# Patient Record
Sex: Male | Born: 1950
Health system: Southern US, Community
[De-identification: ages and names within clinical notes are randomized; demographics above are authoritative.]

## PROBLEM LIST (undated history)

## (undated) DIAGNOSIS — F32A Depression, unspecified: Secondary | ICD-10-CM

## (undated) DIAGNOSIS — E039 Hypothyroidism, unspecified: Secondary | ICD-10-CM

## (undated) DIAGNOSIS — T8789 Other complications of amputation stump: Secondary | ICD-10-CM

## (undated) DIAGNOSIS — D649 Anemia, unspecified: Secondary | ICD-10-CM

## (undated) DIAGNOSIS — Z992 Dependence on renal dialysis: Secondary | ICD-10-CM

## (undated) DIAGNOSIS — IMO0001 Reserved for inherently not codable concepts without codable children: Secondary | ICD-10-CM

## (undated) DIAGNOSIS — Z87442 Personal history of urinary calculi: Secondary | ICD-10-CM

## (undated) DIAGNOSIS — F329 Major depressive disorder, single episode, unspecified: Secondary | ICD-10-CM

## (undated) DIAGNOSIS — Z8701 Personal history of pneumonia (recurrent): Secondary | ICD-10-CM

## (undated) DIAGNOSIS — I2510 Atherosclerotic heart disease of native coronary artery without angina pectoris: Secondary | ICD-10-CM

## (undated) DIAGNOSIS — K219 Gastro-esophageal reflux disease without esophagitis: Secondary | ICD-10-CM

## (undated) DIAGNOSIS — E119 Type 2 diabetes mellitus without complications: Secondary | ICD-10-CM

## (undated) DIAGNOSIS — N289 Disorder of kidney and ureter, unspecified: Secondary | ICD-10-CM

## (undated) DIAGNOSIS — M1990 Unspecified osteoarthritis, unspecified site: Secondary | ICD-10-CM

## (undated) DIAGNOSIS — F419 Anxiety disorder, unspecified: Secondary | ICD-10-CM

## (undated) DIAGNOSIS — Z8719 Personal history of other diseases of the digestive system: Secondary | ICD-10-CM

## (undated) DIAGNOSIS — J189 Pneumonia, unspecified organism: Secondary | ICD-10-CM

## (undated) DIAGNOSIS — R51 Headache: Secondary | ICD-10-CM

## (undated) DIAGNOSIS — N186 End stage renal disease: Secondary | ICD-10-CM

## (undated) DIAGNOSIS — N189 Chronic kidney disease, unspecified: Secondary | ICD-10-CM

## (undated) DIAGNOSIS — I10 Essential (primary) hypertension: Secondary | ICD-10-CM

## (undated) DIAGNOSIS — R200 Anesthesia of skin: Secondary | ICD-10-CM

## (undated) DIAGNOSIS — G629 Polyneuropathy, unspecified: Secondary | ICD-10-CM

## (undated) DIAGNOSIS — I739 Peripheral vascular disease, unspecified: Secondary | ICD-10-CM

## (undated) DIAGNOSIS — J449 Chronic obstructive pulmonary disease, unspecified: Secondary | ICD-10-CM

## (undated) DIAGNOSIS — F4310 Post-traumatic stress disorder, unspecified: Secondary | ICD-10-CM

## (undated) HISTORY — DX: Chronic kidney disease, unspecified: N18.9

## (undated) HISTORY — DX: Type 2 diabetes mellitus without complications: E11.9

## (undated) HISTORY — DX: Dependence on renal dialysis: N18.6

## (undated) HISTORY — PX: INGUINAL HERNIA REPAIR: SUR1180

## (undated) HISTORY — DX: Chronic obstructive pulmonary disease, unspecified: J44.9

## (undated) HISTORY — PX: TONSILLECTOMY: SUR1361

## (undated) HISTORY — DX: End stage renal disease: Z99.2

## (undated) HISTORY — DX: Personal history of pneumonia (recurrent): Z87.01

## (undated) HISTORY — DX: Anemia, unspecified: D64.9

## (undated) HISTORY — DX: Essential (primary) hypertension: I10

---

## 1998-01-24 ENCOUNTER — Encounter: Admission: RE | Admit: 1998-01-24 | Discharge: 1998-04-24 | Payer: Self-pay | Admitting: Anesthesiology

## 1998-05-16 ENCOUNTER — Encounter: Admission: RE | Admit: 1998-05-16 | Discharge: 1998-08-11 | Payer: Self-pay | Admitting: Anesthesiology

## 1998-10-26 ENCOUNTER — Encounter: Admission: RE | Admit: 1998-10-26 | Discharge: 1999-01-24 | Payer: Self-pay | Admitting: Anesthesiology

## 1999-01-26 ENCOUNTER — Encounter: Admission: RE | Admit: 1999-01-26 | Discharge: 1999-04-26 | Payer: Self-pay | Admitting: Anesthesiology

## 1999-05-11 ENCOUNTER — Encounter: Admission: RE | Admit: 1999-05-11 | Discharge: 1999-08-09 | Payer: Self-pay | Admitting: Anesthesiology

## 1999-08-09 ENCOUNTER — Encounter: Admission: RE | Admit: 1999-08-09 | Discharge: 1999-11-06 | Payer: Self-pay | Admitting: Anesthesiology

## 1999-11-28 ENCOUNTER — Encounter: Payer: Self-pay | Admitting: Anesthesiology

## 1999-11-28 ENCOUNTER — Encounter: Admission: RE | Admit: 1999-11-28 | Discharge: 2000-01-22 | Payer: Self-pay | Admitting: Anesthesiology

## 2000-01-22 ENCOUNTER — Encounter: Admission: RE | Admit: 2000-01-22 | Discharge: 2000-04-21 | Payer: Self-pay | Admitting: Anesthesiology

## 2000-09-03 ENCOUNTER — Encounter: Admission: RE | Admit: 2000-09-03 | Discharge: 2000-12-02 | Payer: Self-pay | Admitting: Anesthesiology

## 2000-12-31 ENCOUNTER — Encounter: Admission: RE | Admit: 2000-12-31 | Discharge: 2001-03-31 | Payer: Self-pay | Admitting: Anesthesiology

## 2001-04-02 ENCOUNTER — Encounter: Admission: RE | Admit: 2001-04-02 | Discharge: 2001-04-12 | Payer: Self-pay | Admitting: Anesthesiology

## 2009-05-03 ENCOUNTER — Ambulatory Visit: Payer: Self-pay | Admitting: Cardiology

## 2009-08-13 HISTORY — PX: KNEE ARTHROSCOPY: SUR90

## 2010-08-13 HISTORY — PX: AV FISTULA PLACEMENT: SHX1204

## 2010-08-24 ENCOUNTER — Ambulatory Visit (HOSPITAL_COMMUNITY)
Admission: RE | Admit: 2010-08-24 | Discharge: 2010-08-24 | Payer: Self-pay | Source: Home / Self Care | Attending: Nephrology | Admitting: Nephrology

## 2010-08-28 LAB — CBC
HCT: 36.4 % — ABNORMAL LOW (ref 39.0–52.0)
Hemoglobin: 12.7 g/dL — ABNORMAL LOW (ref 13.0–17.0)
MCH: 33.4 pg (ref 26.0–34.0)
MCHC: 34.9 g/dL (ref 30.0–36.0)
MCV: 95.8 fL (ref 78.0–100.0)
Platelets: 191 10*3/uL (ref 150–400)
RBC: 3.8 MIL/uL — ABNORMAL LOW (ref 4.22–5.81)
RDW: 13.2 % (ref 11.5–15.5)
WBC: 8.1 10*3/uL (ref 4.0–10.5)

## 2010-08-28 LAB — APTT: aPTT: 29 seconds (ref 24–37)

## 2010-08-28 LAB — PROTIME-INR
INR: 0.97 (ref 0.00–1.49)
Prothrombin Time: 13.1 seconds (ref 11.6–15.2)

## 2010-11-27 ENCOUNTER — Emergency Department (HOSPITAL_COMMUNITY): Payer: 59

## 2010-11-27 ENCOUNTER — Inpatient Hospital Stay (HOSPITAL_COMMUNITY)
Admission: EM | Admit: 2010-11-27 | Discharge: 2010-11-30 | DRG: 638 | Disposition: A | Discharge status: Home / Self Care | Payer: 59 | Attending: Internal Medicine | Admitting: Internal Medicine

## 2010-11-27 DIAGNOSIS — E785 Hyperlipidemia, unspecified: Secondary | ICD-10-CM | POA: Diagnosis present

## 2010-11-27 DIAGNOSIS — B3781 Candidal esophagitis: Secondary | ICD-10-CM | POA: Diagnosis present

## 2010-11-27 DIAGNOSIS — N032 Chronic nephritic syndrome with diffuse membranous glomerulonephritis: Secondary | ICD-10-CM | POA: Diagnosis present

## 2010-11-27 DIAGNOSIS — K2960 Other gastritis without bleeding: Secondary | ICD-10-CM | POA: Diagnosis present

## 2010-11-27 DIAGNOSIS — K2100 Gastro-esophageal reflux disease with esophagitis, without bleeding: Secondary | ICD-10-CM | POA: Diagnosis present

## 2010-11-27 DIAGNOSIS — E1300 Other specified diabetes mellitus with hyperosmolarity without nonketotic hyperglycemic-hyperosmolar coma (NKHHC): Principal | ICD-10-CM | POA: Diagnosis present

## 2010-11-27 DIAGNOSIS — K319 Disease of stomach and duodenum, unspecified: Secondary | ICD-10-CM | POA: Diagnosis present

## 2010-11-27 DIAGNOSIS — B370 Candidal stomatitis: Secondary | ICD-10-CM | POA: Diagnosis present

## 2010-11-27 DIAGNOSIS — N189 Chronic kidney disease, unspecified: Secondary | ICD-10-CM | POA: Diagnosis present

## 2010-11-27 DIAGNOSIS — R1310 Dysphagia, unspecified: Secondary | ICD-10-CM | POA: Diagnosis present

## 2010-11-27 DIAGNOSIS — T380X5A Adverse effect of glucocorticoids and synthetic analogues, initial encounter: Secondary | ICD-10-CM | POA: Diagnosis present

## 2010-11-27 DIAGNOSIS — I129 Hypertensive chronic kidney disease with stage 1 through stage 4 chronic kidney disease, or unspecified chronic kidney disease: Secondary | ICD-10-CM | POA: Diagnosis present

## 2010-11-27 LAB — URINE MICROSCOPIC-ADD ON

## 2010-11-27 LAB — GLUCOSE, CAPILLARY
Glucose-Capillary: >600 mg/dL (ref 70–99)
Glucose-Capillary: >600 mg/dL (ref 70–99)

## 2010-11-27 LAB — CBC
HCT: 34.2 % — ABNORMAL LOW (ref 39.0–52.0)
Hemoglobin: 12.2 g/dL — ABNORMAL LOW (ref 13.0–17.0)
MCH: 33.1 pg (ref 26.0–34.0)
MCHC: 35.7 g/dL (ref 30.0–36.0)
MCV: 92.7 fL (ref 78.0–100.0)
Platelets: 110 10*3/uL — ABNORMAL LOW (ref 150–400)
RBC: 3.69 MIL/uL — ABNORMAL LOW (ref 4.22–5.81)
RDW: 13.9 % (ref 11.5–15.5)
WBC: 6 10*3/uL (ref 4.0–10.5)

## 2010-11-27 LAB — COMPREHENSIVE METABOLIC PANEL
ALT: 26 U/L (ref 0–53)
AST: 15 U/L (ref 0–37)
Albumin: 2.8 g/dL — ABNORMAL LOW (ref 3.5–5.2)
Alkaline Phosphatase: 70 U/L (ref 39–117)
BUN: 80 mg/dL — ABNORMAL HIGH (ref 6–23)
CO2: 24 mEq/L (ref 19–32)
Calcium: 9 mg/dL (ref 8.4–10.5)
Chloride: 95 mEq/L — ABNORMAL LOW (ref 96–112)
Creatinine, Ser: 4.02 mg/dL — ABNORMAL HIGH (ref 0.4–1.5)
GFR calc Af Amer: 19 mL/min — ABNORMAL LOW (ref >60)
GFR calc non Af Amer: 15 mL/min — ABNORMAL LOW (ref >60)
Glucose, Bld: 868 mg/dL (ref 70–99)
Potassium: 4.2 mEq/L (ref 3.5–5.1)
Sodium: 130 mEq/L — ABNORMAL LOW (ref 135–145)
Total Bilirubin: 0.6 mg/dL (ref 0.3–1.2)
Total Protein: 5.4 g/dL — ABNORMAL LOW (ref 6.0–8.3)

## 2010-11-27 LAB — DIFFERENTIAL
Basophils Absolute: 0 10*3/uL (ref 0.0–0.1)
Basophils Relative: 0 % (ref 0–1)
Eosinophils Absolute: 0 10*3/uL (ref 0.0–0.7)
Eosinophils Relative: 0 % (ref 0–5)
Lymphocytes Relative: 23 % (ref 12–46)
Lymphs Abs: 1.4 10*3/uL (ref 0.7–4.0)
Monocytes Absolute: 0.3 10*3/uL (ref 0.1–1.0)
Monocytes Relative: 6 % (ref 3–12)
Neutro Abs: 4.3 10*3/uL (ref 1.7–7.7)
Neutrophils Relative %: 72 % (ref 43–77)

## 2010-11-27 LAB — URINALYSIS, ROUTINE W REFLEX MICROSCOPIC
Bilirubin Urine: NEGATIVE
Glucose, UA: >1000 mg/dL — AB
Ketones, ur: NEGATIVE mg/dL
Leukocytes, UA: NEGATIVE
Nitrite: NEGATIVE
Protein, ur: 100 mg/dL — AB
Specific Gravity, Urine: 1.02 (ref 1.005–1.030)
Urobilinogen, UA: 0.2 mg/dL (ref 0.0–1.0)
pH: 6 (ref 5.0–8.0)

## 2010-11-27 LAB — POCT CARDIAC MARKERS
CKMB, poc: 2.4 ng/mL (ref 1.0–8.0)
CKMB, poc: 1.9 ng/mL (ref 1.0–8.0)
Myoglobin, poc: 330 ng/mL (ref 12–200)
Myoglobin, poc: 367 ng/mL (ref 12–200)
Troponin i, poc: <0.05 ng/mL (ref 0.00–0.09)
Troponin i, poc: <0.05 ng/mL (ref 0.00–0.09)

## 2010-11-27 LAB — BRAIN NATRIURETIC PEPTIDE: Pro B Natriuretic peptide (BNP): 47.8 pg/mL (ref 0.0–100.0)

## 2010-11-27 LAB — MRSA PCR SCREENING: MRSA by PCR: NEGATIVE

## 2010-11-27 LAB — LIPASE, BLOOD: Lipase: 37 U/L (ref 11–59)

## 2010-11-28 ENCOUNTER — Inpatient Hospital Stay (HOSPITAL_COMMUNITY): Payer: 59

## 2010-11-28 DIAGNOSIS — R1310 Dysphagia, unspecified: Secondary | ICD-10-CM

## 2010-11-28 DIAGNOSIS — R9431 Abnormal electrocardiogram [ECG] [EKG]: Secondary | ICD-10-CM

## 2010-11-28 DIAGNOSIS — B3781 Candidal esophagitis: Secondary | ICD-10-CM

## 2010-11-28 LAB — GLUCOSE, CAPILLARY
Glucose-Capillary: 108 mg/dL — ABNORMAL HIGH (ref 70–99)
Glucose-Capillary: 126 mg/dL — ABNORMAL HIGH (ref 70–99)
Glucose-Capillary: 176 mg/dL — ABNORMAL HIGH (ref 70–99)
Glucose-Capillary: 177 mg/dL — ABNORMAL HIGH (ref 70–99)
Glucose-Capillary: 179 mg/dL — ABNORMAL HIGH (ref 70–99)
Glucose-Capillary: 214 mg/dL — ABNORMAL HIGH (ref 70–99)
Glucose-Capillary: 244 mg/dL — ABNORMAL HIGH (ref 70–99)
Glucose-Capillary: 261 mg/dL — ABNORMAL HIGH (ref 70–99)
Glucose-Capillary: 299 mg/dL — ABNORMAL HIGH (ref 70–99)
Glucose-Capillary: 303 mg/dL — ABNORMAL HIGH (ref 70–99)
Glucose-Capillary: 359 mg/dL — ABNORMAL HIGH (ref 70–99)
Glucose-Capillary: 510 mg/dL — ABNORMAL HIGH (ref 70–99)
Glucose-Capillary: 587 mg/dL (ref 70–99)
Glucose-Capillary: 93 mg/dL (ref 70–99)

## 2010-11-28 LAB — DIFFERENTIAL
Basophils Absolute: 0 10*3/uL (ref 0.0–0.1)
Basophils Relative: 0 % (ref 0–1)
Eosinophils Relative: 0 % (ref 0–5)
Monocytes Absolute: 0.4 10*3/uL (ref 0.1–1.0)
Neutro Abs: 5.2 10*3/uL (ref 1.7–7.7)

## 2010-11-28 LAB — CARDIAC PANEL(CRET KIN+CKTOT+MB+TROPI)
CK, MB: 2.9 ng/mL (ref 0.3–4.0)
CK, MB: 4.3 ng/mL — ABNORMAL HIGH (ref 0.3–4.0)
Relative Index: INVALID (ref 0.0–2.5)
Total CK: 25 U/L (ref 7–232)
Troponin I: 0.05 ng/mL (ref 0.00–0.06)

## 2010-11-28 LAB — COMPREHENSIVE METABOLIC PANEL
ALT: 24 U/L (ref 0–53)
AST: 16 U/L (ref 0–37)
Calcium: 8.6 mg/dL (ref 8.4–10.5)
GFR calc Af Amer: 20 mL/min — ABNORMAL LOW (ref >60)
Sodium: 140 mEq/L (ref 135–145)
Total Protein: 5 g/dL — ABNORMAL LOW (ref 6.0–8.3)

## 2010-11-28 LAB — APTT: aPTT: 22 seconds — ABNORMAL LOW (ref 24–37)

## 2010-11-28 LAB — CBC
MCHC: 36 g/dL (ref 30.0–36.0)
RDW: 14.1 % (ref 11.5–15.5)

## 2010-11-28 LAB — IRON AND TIBC
Iron: 33 ug/dL — ABNORMAL LOW (ref 42–135)
TIBC: 200 ug/dL — ABNORMAL LOW (ref 215–435)

## 2010-11-28 LAB — VITAMIN B12: Vitamin B-12: 475 pg/mL (ref 211–911)

## 2010-11-28 LAB — PROTIME-INR
INR: 0.94 (ref 0.00–1.49)
Prothrombin Time: 12.8 seconds (ref 11.6–15.2)

## 2010-11-28 LAB — HEMOGLOBIN A1C: Mean Plasma Glucose: 275 mg/dL — ABNORMAL HIGH (ref <117)

## 2010-11-28 LAB — FOLATE: Folate: >20 ng/mL

## 2010-11-29 ENCOUNTER — Other Ambulatory Visit (INDEPENDENT_AMBULATORY_CARE_PROVIDER_SITE_OTHER): Payer: Self-pay | Admitting: Internal Medicine

## 2010-11-29 DIAGNOSIS — K208 Other esophagitis without bleeding: Secondary | ICD-10-CM

## 2010-11-29 DIAGNOSIS — K219 Gastro-esophageal reflux disease without esophagitis: Secondary | ICD-10-CM

## 2010-11-29 DIAGNOSIS — K2970 Gastritis, unspecified, without bleeding: Secondary | ICD-10-CM

## 2010-11-29 DIAGNOSIS — K2990 Gastroduodenitis, unspecified, without bleeding: Secondary | ICD-10-CM

## 2010-11-29 LAB — DIFFERENTIAL
Basophils Absolute: 0 10*3/uL (ref 0.0–0.1)
Eosinophils Absolute: 0 10*3/uL (ref 0.0–0.7)
Eosinophils Relative: 0 % (ref 0–5)
Lymphocytes Relative: 26 % (ref 12–46)

## 2010-11-29 LAB — GLUCOSE, CAPILLARY
Glucose-Capillary: 184 mg/dL — ABNORMAL HIGH (ref 70–99)
Glucose-Capillary: 334 mg/dL — ABNORMAL HIGH (ref 70–99)
Glucose-Capillary: 304 mg/dL — ABNORMAL HIGH (ref 70–99)
Glucose-Capillary: 309 mg/dL — ABNORMAL HIGH (ref 70–99)

## 2010-11-29 LAB — BASIC METABOLIC PANEL
GFR calc non Af Amer: 15 mL/min — ABNORMAL LOW (ref >60)
Potassium: 4 mEq/L (ref 3.5–5.1)
Sodium: 131 mEq/L — ABNORMAL LOW (ref 135–145)

## 2010-11-29 LAB — CBC
HCT: 29.2 % — ABNORMAL LOW (ref 39.0–52.0)
Platelets: 86 10*3/uL — ABNORMAL LOW (ref 150–400)
RDW: 14.4 % (ref 11.5–15.5)
WBC: 6.4 10*3/uL (ref 4.0–10.5)

## 2010-11-29 LAB — PHOSPHORUS: Phosphorus: 3.4 mg/dL (ref 2.3–4.6)

## 2010-11-29 LAB — LIPASE, BLOOD: Lipase: 22 U/L (ref 11–59)

## 2010-11-29 LAB — MAGNESIUM: Magnesium: 2 mg/dL (ref 1.5–2.5)

## 2010-11-30 LAB — GLUCOSE, CAPILLARY
Glucose-Capillary: 168 mg/dL — ABNORMAL HIGH (ref 70–99)
Glucose-Capillary: 356 mg/dL — ABNORMAL HIGH (ref 70–99)

## 2010-12-01 LAB — H. PYLORI ANTIBODY, IGG: H Pylori IgG: <0.4 {ISR}

## 2010-12-17 NOTE — Op Note (Signed)
[name], [name]               ACCOUNT NO.:  [id number]  MEDICAL RECORD NO.:  [id number]           PATIENT TYPE:  I  LOCATION:  A313                          FACILITY:  [facility]  PHYSICIAN:  Hildred Laser, M.D.    DATE OF BIRTH:  [birth year]  DATE OF PROCEDURE:  11/29/2010 DATE OF DISCHARGE:                               PROCEDURE NOTE   PROCEDURE:  Esophagogastroduodenoscopy with esophageal dilation.  INDICATIONS:  [name] is 60 year old Caucasian male who was admitted 2 days ago with blood glucose level of 800 and nonketotic hyperosmolar state.  He has recently been on high-dose prednisone for glomerulosclerosis.  He has been having intermittent dysphagia for several months.  He is therefore undergoing this evaluation.  He is also being treated for oral candidiasis.  Procedures were reviewed with the patient.  Informed consent was obtained.  MEDS FOR CONSCIOUS SEDATION:  Cetacaine spray for oropharyngeal topical anesthesia, Demerol 50 mg IV, Versed 12 mg IV in divided dose.  FINDINGS:  Procedure performed in endoscopy suite.  The patient's vital signs and O2 sat were monitored during the procedure and remained stable.  The patient was placed in left lateral recumbent position and Pentax videoscope was passed through oropharynx without any difficulty into the esophagus.  Esophagus.  In the proximal segment, there were few small patches of whitish material or exudate consistent with candida.  Mucosa in the distal segment was normal.  There was single erosion just proximal to GE junction and a small ulcer at GE junction along with a ring which is not critical.  A GE junction was located at 40 cm and hiatus at 42.  Stomach.  It was empty and distended very well by insufflation.  Folds of proximal stomach are normal.  Examination of mucosa revealed few antral/prepyloric erosions, but no ulcer crater was noted.  Pyloric channel was patent.  Angularis, fundus, and cardia were  examined. Angularis, fundus, and cardia, these areas were normal.  Duodenum.  Bulbar mucosa was normal.  Scope was passed to second part of duodenum where there were multiple small areas with hyperemia and a flat surface compared to the surrounding mucosa.  None of these appeared to be ulcerated or eroded.  Biopsy was taken from three of these lesions and the scope was withdrawn.  Esophagus was dilated by passing 54-French Maloney dilator to full insertion.  The post dilation endoscope was passed again and no mucosal disruption noted to the esophagus.  The patient tolerated the procedure well.  FINAL DIAGNOSES: 1. Mild changes of Candida esophagitis limited to proximal esophagus.     I suspect this is resolving or treated Candida. 2. Erosive/ulcerative reflux esophagitis with noncritical ring and a     small sliding hiatal hernia.  Esophagus was dilated by passing 54-     [place] Maloney dilator given history of dysphagia, but no mucosal     disruption induced. 3. Erosive antral gastritis. 4. Multiple small patches of flat, slightly erythematous mucosa     involving the second part of the duodenum.  This area was biopsied     for histology.  This could be partially  healed ulcers.  Endoscopic     appearance not typical of celiac disease.  RECOMMENDATIONS: 1. He is to be on a chronic PPI therapy.  Should continue with     Mycostatin for another 10 days or so.  If this infection recurs, he     will need to be treated with fluconazole.  Please note that with     diabetes and steroid use is at high risk for Candida esophagitis. 2. We will check his H. pylori serology. 3. I will be contacting patient with results of biopsy when available.     Hildred Laser, M.D.     NR/MEDQ  D:  11/29/2010  T:  11/30/2010  Job:  [id number]  cc:   Dr. Woody Seller  Electronically Signed by Hildred Laser M.D. on 12/17/2010 09:39:16 PM

## 2010-12-17 NOTE — Consult Note (Signed)
NAMECORBAN, YOTHER               ACCOUNT NO.:  [id number]  MEDICAL RECORD NO.:  [id number]           PATIENT TYPE:  LOCATION:                                 FACILITY:  PHYSICIAN:  Hildred Laser, M.D.    DATE OF BIRTH:  1950-11-19  DATE OF CONSULTATION:  11/28/2010 DATE OF DISCHARGE:                                CONSULTATION   He is in ICU bed 12.  REASON FOR CONSULTATION:  Dysphagia.  HISTORY OF PRESENT ILLNESS:  Mr. Zeller is a 60 year old white male admitted through the emergency department with a new onset of diabetes. He also complained of acid reflux and dysphagia.  He also states that he had thrush which started over a week ago.  He had a whitish coating to his gums.  He has actually tried treating the candidiasis himself at home with gentian violet which he says has helped some, but has not completely resolved.  He does have a history of candidiasis of his oropharynx in the past.  He is presently using nystatin which is helping.  He gives a recent history of water burning and bubbles up into his esophagus.  He does not have problems with tea.  He has had solid food dysphagia for about a year.  Foods are lodging in his mid esophagus.  He chews his food well and takes sips of water for the bolus to go down.  He denies a prior history of acid reflux.  He says dysphagia occurs once a week or every other week.  PAST MEDICAL HISTORY: 1. 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 2. New-onset diabetes, possibly related to his steroid use. 3. Hypertension. 4. High cholesterol. 5. Migraines. 6. Oral candidiasis.  ALLERGIES:  No known allergies but he says the morphine does not help relieve his pain.  PAST SURGICAL HISTORY:  He has had a right knee arthroscopy, tonsillectomy, and a right hernia repair x2.  SOCIAL HISTORY:  He smokes 5 cigars a day.  He smokes two marijuana joints a  day.  He is disabled.  He has two children in good health.  FAMILY HISTORY:  His father is deceased from a brain aneurysm.  His mother is deceased from complications of lung surgery for a carcinoma. Two brothers in good health.  One sister alive, in good health that is being evaluated for Pott's syndrome.  HOME MEDICATIONS: 1. Simvastatin 20 mg a day. 2. Norvasc 10 mg a day. 3. Diazepam 10 mg three times a day. 4. Carvedilol 25 mg twice a day. 5. Lasix 40 mg a day. 6. Prednisone taper presently at 55 mg a day. 7. Hydralazine one a day. 8. Oxycodone 10 mg four times a day as needed. 9. MiraLax once a day.  OBJECTIVE:  VITAL SIGNS:  His blood pressure is 141/82, pulse 74, respirations 18.  His O2 sat is 96.  His weight is 96.7 kg.  His height is 76 inches. HEENT:  His sclerae are anicteric.  His conjunctivae are pink.  He is edentulous.  His oral  mucosa is dry.  He has a brownish discoloration to his tongue.  I do not see any white patches to his gums. NECK:  His thyroid is normal.  There is no cervical lymphadenopathy. HEART:  Regular rate and rhythm. ABDOMEN:  Soft.  Bowel sounds are positive.  No masses. EXTREMITIES:  He has 2+ edema to his lower extremities.  He is on a clear liquid diet.  LABORATORY DATA:  On November 18, 2010, his creatine kinase is 36; CK-MB 4.3, elevated slightly; troponin is 0.04.  Sodium 140, potassium 3.6, chloride 106, CO2 is 22, glucose is down to 160, BUN 69, creatinine is 3.74 and elevated, total bilirubin is 0.6, ALP 65, AST 16, ALT 24, total protein 5.0, albumin 2.6, calcium 8.6.  CBC:  His white count today is 7.6, hemoglobin 11.7, hematocrit is 32.5, MCV is 92.3, platelets are 100 and low.  His PTT is 22, PT is 12.8, INR is 0.94.  Urine:  0-2 wbc's, 21- 50 rbc's, rare bacteria, rare yeast.  He did undergo ultrasound of the abdomen this afternoon which revealed an echogenic kidneys bilaterally compatible with medical renal disease, incomplete  visualization of the pancreatic tail, and mildly dilated common bile, common bile duct was 9 mm in diameter.  ASSESSMENT:  Mr. Lahner is a 60 year old male who is undergoing therapy nonketotic hyperosmolar state. He is also being treated for oral candidiasis. He has been experiencing  dysphagia  for over a year. Therefore doubt that it is secondary to Candida. He possibly has esophageal ring but also could have  an esophageal stricture.  RECOMMENDATIONS:  Agree with Protonix and  oral nystatin. We will schedule an EGD/ED in am. Thank you for allowing [place] to participate in his care.    ______________________________ Deberah Castle, NP   ______________________________ Hildred Laser, M.D.   TS/MEDQ  D:  11/28/2010  T:  11/29/2010  Job:  [id number]  Electronically Signed by Deberah Castle PA on 11/29/2010 11:42:14 AM Electronically Signed by Hildred Laser M.D. on 12/17/2010 09:37:37 PM

## 2010-12-18 NOTE — Discharge Summary (Signed)
[name], [name]               ACCOUNT NO.:  [id number]  MEDICAL RECORD NO.:  [id number]           PATIENT TYPE:  I  LOCATION:  A313                          FACILITY:  [facility]  PHYSICIAN:  [name], MDDATE OF BIRTH:  1951-01-28  DATE OF ADMISSION:  11/27/2010 DATE OF DISCHARGE:  04/19/2012LH                              DISCHARGE SUMMARY   DISCHARGE DIAGNOSES: 1. Hyperosmolar nonketotic state. 2. New-onset diabetes, steroid induced. 3. Thrush with probable Candida esophagitis. 4. 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 5. Hypertension. 6. Hyperlipidemia. 7. Chronic edema. 8. Erosive reflux esophagitis with noncritical ring and small sliding     hiatal hernia status post dilatation. 9. Erosive antral gastritis. 10.Multiple lesions of the duodenum, possibly healed ulcers.  DISCHARGE MEDICATIONS: 1. Acetaminophen 650 mg p.o. q.4 h p.r.n. pain. 2. Lantus insulin 16 units subcutaneously nightly. 3. NovoLog sliding scale as directed. 4. Nystatin suspension 30 mL p.o. q.i.d. for another week. 5. Omeprazole 40 mg a day. 6. Prednisone has been decreased to 40 mg a day per Dr. Serita [name]     recommendations. 7. Continue amlodipine 10 mg a day. 8. Carvedilol 25 mg twice a day. 9. Cyclobenzaprine 10 mg p.o. t.i.d. as needed for muscle spasm. 10.Diazepam 10 mg daily as needed for anxiety. 11.Furosemide 40 mg a day. 12.Hydralazine 50 mg t.i.d. 13.Oxycodone 5 mg 2 tablets 4 times a day as needed for pain. 14.Simvastatin 20 mg a day. 15.Vitamin D2 of 50,000 units weekly.  CONDITION:  Stable.  ACTIVITY:  Ad lib.  DIET:  Should be diabetic heart-healthy.  FOLLOWUP:  With Dr. Elmarie [name].  He is to call for an appointment. Follow up with Dr. Quillian [name] to assist with diabetes management.  CONSULTATIONS:  Dr.  Hildred [name].  PROCEDURES:  EGD showing mild changes of Candida esophagitis limited to proximal esophagus, erosive ulcerative reflux esophagitis  with noncritical ring, and small sliding hiatal hernia.  Erosive antral gastritis, multiple small patches in the duodenum, possibly healed ulcers status post biopsy, pending.  LABORATORY DATA ON ADMISSION:  CBC significant for a platelet count of 110, normal white count, hemoglobin was 12.2, and hematocrit 34.2. Sodium on admission 130, potassium 4.2, chloride 95, bicarbonate 24, glucose 868, BUN 80, and creatinine 4.02.  Liver function tests significant for an albumin of 2.8.  On April 18, his glucose was 212, BUN 63, and creatinine 4.  Hemoglobin A1c was 11.2.  BNP, cardiac enzymes negative.  TSH 4.48, iron 33, TIBC 200, ferritin 212, folate and B12unremarkable.  Urinalysis on admission showed greater than 1000 glucose, large blood, 100 protein, 21-50 red cells, rare yeast, rare bacteria.  Urine cultures negative.  DIAGNOSTICS:  Chest x-ray showed no infiltrate, question nipple shadow, calcified mildly tortuous aorta.  Repeat chest x-ray showed no pulmonary nodules.  Ultrasound of the abdomen showed echogenic kidneys bilateral compatible with medical renal disease.  Mildly dilated common bile duct. EKG showed normal sinus rhythm, right bundle-branch block.  HISTORY AND HOSPITAL COURSE:  Please see H and P for details.  [name] is a pleasant 60 year old white male on high-dose steroids for recently diagnosed focal segmental glomerulosclerosis.  He presented with severe pain with swallowing, polyuria, polydipsia, and acid reflux symptoms.  He had a blood pressure of 143/91 in the ER, otherwise unremarkable vital signs.  He had dry mucous membranes and evidence of thrush.  He was found to be severely hyperglycemic.  He previously had no diagnosis of diabetes.  His anion gap was normal.  He was started on IV fluids, IV insulin, and monitored in the Step-Down Unit.  He was given nystatin, proton pump inhibitors, and GI was consulted.  His odynophagia and thrush and dehydration  improved.  His sugars are ranging in the 100-300 range but generally below 200 over the past 24 hours.  He has been given diabetic teaching.  I discussed the case with [name] and he recommends decreasing the prednisone to 40 mg and he will taper it off as an outpatient.  He will so try an alternate treatment for the kidney problem as he apparently has not responded to the steroids. Because of his prednisone being tapered, I have placed the patient on insulin with frequent blood glucose checks so that his regimen can be adjusted as his prednisone dose is decreased.  [name] agrees with this plan.  [name] agreed with management and recommended continuing the nystatin and proton pump inhibitor.  He will call the patient with the results of the biopsy.  The patient's symptoms are much improved.  His vital signs stable and he is going to be discharged with outpatient diabetes education.  Total time on the day of discharge is greater than 30 minutes.     Clayton Massman L. Conley Canal, MD     CLS/MEDQ  D:  11/30/2010  T:  12/01/2010  Job:  [id number]  cc:   Clayton Shiley, MD  Dr. Virgel [name]  Dr. Delma [name]  Electronically Signed by Clayton Barthel MD on 12/18/2010 08:05:44 AM

## 2010-12-25 NOTE — H&P (Signed)
[name], [name]               ACCOUNT NO.:  [id number]  MEDICAL RECORD NO.:  [id number]           PATIENT TYPE:  E  LOCATION:  [facility]                          FACILITY:  [facility]  PHYSICIAN:  Bonnielee Haff, MD     DATE OF BIRTH:  Oct 18, 1950  DATE OF ADMISSION:  11/27/2010 DATE OF DISCHARGE:  LH                             HISTORY & PHYSICAL   PRIMARY CARE PHYSICIAN:  Dr. Quillian Quince in [place], [place].  The patient is followed by Dr. Elmarie Shiley with [facility] in [place].  ADMISSION DIAGNOSES: 1. Hyperosmolar nonketotic state with hyperglycemia. 2. New-onset diabetes. 3. Oral candidiasis. 4. Gastroesophageal reflux disease. 5. 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 6. History of hypertension. 7. History of hypercholesterolemia.  CHIEF COMPLAINT:  Indigestion, acid reflux.  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  He has been followed by Dr. Elmarie Shiley.  His creatinine fluctuates between 2.7 and 4.  He was put on steroids a couple of months ago for this condition.  He presents today to the hospital with complaints of worsening indigestion over the last 2 weeks.  He has been having a lot of acid reflux, lot of regurgitation.  He has never had similar symptoms in the past.  These symptoms have been getting worse. He has also had some nonspecific abdominal pain on and off, none currently.  He has had mild vomiting as a result of regurgitation as well.  He denies any chest pain or shortness of breath per se.  He is feeling a little bit depressed because his father passed away about 2 weeks ago.  He has been very stressed as well.  He has also had some constipation issues for awhile.  He has been feeling very thirsty, has had increased urination, has had some burning sensation with  urination as well in the last couple of weeks.  MEDICATIONS AT HOME: 1. Simvastatin 20 mg daily. 2. Norvasc 10 mg daily. 3. Diazepam 10 mg t.i.d. 4. Carvedilol 25 mg b.i.d. 5. Lasix 40 mg daily. 6. Prednisone currently on 55 mg daily.  He was on 60 mg for awhile. 7. Hydralazine 50 mg 4 times a day. 8. Oxycodone 10 mg 4 times a day as needed.  ALLERGIES:  Include MORPHINE, actually it is not a true allergic reaction.  He tells me that the morphine is ineffective.  PAST MEDICAL HISTORY:  Positive for hypertension, hypercholesterolemia, recently diagnosed focal segmental glomerulosclerosis, this was diagnosed based on a renal biopsy done in January 2012.  He is followed by Dr. Elmarie Shiley in [place].  SURGICAL HISTORY:  Includes hernia surgery, arthroscopic procedure to the right knee, tonsillectomy, and renal biopsy.  SOCIAL HISTORY:  Lives in [place] with his wife.  His wife works in the [employer] at [employer].  He smokes 1 pack of cigarettes including some cigars. No alcohol use.  He said he has had 1 beer the entire last year.  Drugs, he does admit to use smoking marijuana at least once a day to help him with pain and to help him go to sleep.  FAMILY HISTORY:  There is history of colon cancer and diabetes in the family.  REVIEW OF SYSTEMS:  GENERAL SYSTEM:  Positive for weakness, malaise. HEENT:  Unremarkable.  CARDIOVASCULAR:  As in HPI.  GI: As in HPI.  GU: Unremarkable.  NEUROLOGIC:  Unremarkable.  PSYCHIATRIC:  Unremarkable. RHEUMATOLOGIC:  Unremarkable.  Other systems were reviewed and were found to be negative.  PHYSICAL EXAMINATION:  VITAL SIGNS:  Temperature 98.2, blood pressure 143/91, heart rate 74, respiratory rate 20, saturation 94% on room air. GENERAL:  He is an overweight white male, in no distress. HEENT:  Head is normocephalic, atraumatic.  Pupils are equal reacting. No pallor, no icterus.  Oral mucous membranes is slightly dry.  He has got some white plaques in  the oral cavity, suggestive of Candida. NECK:  Soft and supple.  No thyromegaly is appreciated.  No cervical, supraclavicular, or inguinal lymphadenopathy is present. LUNGS:  Clear to auscultation bilaterally with no wheezing, rales, or rhonchi. CARDIOVASCULAR:  S1 and S2 is normal, regular.  No S3, S4, rubs, murmurs, or bruits. ABDOMEN:  Soft.  Minimal epigastric tenderness without any rebound, rigidity, or guarding.  No masses or organomegaly is appreciated. GU: Deferred. MUSCULOSKELETAL:  Normal muscle mass and tone. NEUROLOGIC:  He is alert and oriented x3.  No focal neurological deficits are present. SKIN:  Does not reveal any rashes.  LAB DATA:  His white cell count is 6.0, hemoglobin is 12.2, platelet count is 110.  Sodium is 130, potassium 4.2, chloride is 95, bicarb is 24, glucose is 868, anion gap is 11, BUN is 80, creatinine is 4.  LFTs are normal.  Albumin is 2.8.  No older creatinine available in the last 1 year.  His cardiac enzymes are negative.  Lipase is 37.  BNP is 47. His UA shows glucosuria, large blood, some protein, 21-50 rbc's are noted, 0-2 wbc's, rare bacteria, rare yeast.  EKG was done which shows sinus rhythm with normal axis.  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 No older EKG available for comparison at this time.  IMAGING STUDIES:  He had a chest x-ray which showed no infiltrate or CHF or pneumonia, nipple shadow on the right, calcified mildly tortuous aorta was noted.  ASSESSMENT:  This is a 60 year old Caucasian male with a history of hypertension, hypercholesterolemia, who was diagnosed with focal segmental glomerulosclerosis in January 2012, and was put on steroids by his nephrologist.  He comes in with fatigue, acid reflux, and he was found to have hyperglycemia.  This is most likely steroid-induced diabetes.  He has abnormal EKG as well.  He has oral candidiasis.  PLAN: 1. Hyperosmolar nonketotic  state with hyperglycemia and new-onset     diabetes secondary to steroids.  He will be treated with IV insulin     and will be transitioned to subcutaneous insulin when his blood     sugars come down. 2. He will be given IV fluids as well. 3. Acid reflux are probably from steroids and high blood sugar.  He     does have oral candidiasis, so he could have esophageal candidiasis     as well.  We will put him on PPI, give him oral nystatin for now,     and see how his symptoms evolve.  If his symptoms do not improve,  he may need a GI workup. 4. Abnormal EKG.  He said he has had an EKG done in his PMD's office     and has had a negative stress test within the last 2 years.  We     will try to obtain results of the EKG from his PMD's office.  We     will cycle cardiac enzymes, get an echocardiogram as well. 5. Oral candidiasis.  He will be treated with nystatin. 6. Anemia, probably from chronic acute kidney disease.  We will check     anemia panel. 7. Focal segmental glomerulosclerosis.  Continue with steroids for     now.  We will obtain records from Dr. Serita Grit office. 8. Hematuria.  We will get an ultrasound of his abdomen, check a total     CK level. 9. DVT prophylaxis will be initiated.  Further management decisions will depend on results of further testing and the patient's response to treatment.  Bonnielee Haff, MD     GK/MEDQ  D:  11/27/2010  T:  11/27/2010  Job:  [id number]  cc:   Elmarie Shiley, MD  Dr. Virgel Gess, [place]  Electronically Signed by Bonnielee Haff MD on 12/25/2010 10:19:29 PM

## 2010-12-29 NOTE — H&P (Signed)
[facility]  Patient:    [name], [name] Visit Number: [id number] MRN: [id number]          Service Type: PMG Location: [facility] Attending Physician:  Nicholaus Bloom Adm. Date:  12/31/2000 Disc. Date: 03/31/2001   CC:         Dr. Gar Ponto, Ledell Noss [place]   History and Physical  INTERVAL HISTORY:  Cortavious comes in for follow-up evaluation of his chronic neck pain on the basis of cervical spondylosis and myofascial pain.  Since his last evaluation, the patient has been laid off, been to three funerals, and is very stressed out with his family.  He continues to have neck discomfort, but it is much improved from previously.  He has developed some epigastric versus chest pain and has been consulted with Dr. Quillian Quince concerning this.  He has developed a left maxillary sinus pain with greenish discharge.  He is wheezing recently.  He has been to the beach and feels better after having gone there.  He continues to have midthoracic dorsal spine pain and some left hip discomfort to a mild extent.  MEDICATIONS: Toprol, Hyzaar, hydrocodone, and Xanax.  PHYSICAL EXAMINATION:  VITAL SIGNS:  Blood pressure 134/75, heart rate 59, respiratory rate 17, O2 saturation 98%, pain level is 6/10.  MUSCULOSKELETAL/NEUROLOGIC:  Range of motion of his neck was relatively intact.  Deep tendon reflexes were symmetric in the upper and lower extremities.  Motor is 5/5.  HEENT:  He demonstrates diffuse wheezes of his lungs.  He has tenderness over his left maxillary sinus.  IMPRESSION: 1. Cervical spondylosis with neck pain. 2. Upper respiratory tract infection. 3. Other medical problems per Dr. Gar Ponto.  DISPOSITION: 1. Continue hydrocodone 7.5/500 mg one p.o. q.8h. p.r.n., #50 with two    refills. 2. Xanax 2 mg one-half to one p.o. q.8h. p.r.n., #100, with no refill. 3. Bactrim DS one p.o. b.i.d., #20 with no refill.  He is not allergic to    sulfonamides per his  knowledge. 4. Follow up with me in three months. Attending Physician:  Nicholaus Bloom DD:  04/02/01 TD:  04/03/01 Job: [id number]

## 2010-12-29 NOTE — Procedures (Signed)
[place]  Patient:    [name], [name]                      MRN: [id number] Proc. Date: 12/11/99 Adm. Date:  [id number] Attending:  Nicholaus Bloom CC:         Dr. Gar Ponto                           Procedure Report  PROCEDURE PERFORMED:  Bilateral lesser occipital nerve blocks.  ANESTHESIOLOGIST:  Nicholaus Bloom, M.D.  DIAGNOSIS:  Cervicogenic headaches with underlying cervical spondylosis and mild lumbar degenerative disk disease.  Since his last evaluation, the patient has been on Zanaflex.  He has noted that he has a little bit more fatigue but he has been out fishing and he has done a little bit more work on his house.  He complains today mostly of neck discomfort.  PHYSICAL EXAMINATION:  Blood pressure 126/74, heart rate 72, respiratory rate 16, oxygen saturations 100%, pain level is 7/10.  Temperature is 97 degrees. He has had tenderness over the lesser occipital grooves bilaterally.  Deep tendon reflexes in the lower and upper extremities were symmetric.  Other medications include Gemfibrozil, Toprol, Lotensin.  His Zanaflex is up to 5 tablets per day as well as glucosamine and Xanax.  DESCRIPTION OF PROCEDURE:  After verbal informed consent was obtained, I went ahead and prepped out the lesser occipital grooves bilaterally and injected each site using a 25 gauge needle following Betadine prep x 3 with 2 cc of 1% lidocaine with 15 mg of Medrol.  The patient tolerated the procedure well and noted that his headache improved.  Postprocedure condition was stable.  DISCHARGE INSTRUCTIONS: 1. Resume previous diet. 2. Limitation of activies per instruction sheet. 3. Continue to increase the dose of Zanaflex as per protocol. 4. Follow up with me in six weeks. 5. The patient may benefit from chiropractic intervention.  His lumbar spine films showed spurring at L2-3 in the lumbar views and in the neck he demonstrated some mild  spurring predominantly at C5-6. DD:  12/11/99 TD:  12/12/99 Job: [id number]
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
## 2010-12-29 NOTE — H&P (Signed)
[facility]  Patient:    [name], [facility]                      MRN: [id number] Adm. Date:  [id number] Attending:  Nicholaus Bloom CC:         Gar Ponto, M.D., [place], [place]                         History and Physical  FOLLOWUP EVALUATION  [place] comes in for followup evaluation of his right-sided neck discomfort on the basis of cervical spondylosis with associated headaches.  Since his previous evaluation, he continues on alprazolam 2 mg 3/4 tablet at night and the Vicodin 5/500 which he takes up to two per night.  He continues with chiropractic treatments which are occurring about twice a week right now.  He does feel better with this.  He continues to have neck discomfort and some numbness out to his right leg as well as lower back discomfort which is helped to an extent by the chiropractor.  He has not noted a great deal of change since his last visit.  He does note some constipation from the hydrocodone.  PHYSICAL EXAMINATION:  VITAL SIGNS:   Blood pressure 144/75, heart rate 67, respiratory rate 18, O2 saturation 99%, pain level 6 out of 10, temperature 97.1.  NECK:  He demonstrates ongoing mild limitation in range of motion of his neck.  NEUROLOGIC:  Deep tendon reflexes are symmetric in the upper and lower extremities with downgoing toes.  He exhibits tenderness to the right trapezius muscle.  IMPRESSION: 1. Pain in the neck on the basis of cervical spondylosis and underlying    myofascial pain. 2. Lumbar degenerative disc disease. 3. Other medical problems per Dr. Gar Ponto.  DISPOSITION: 1. Continue on Xanax 2 mg 0.5 to 1 p.o. q.8h. p.r.n. 2. Increase hydrocodone to 7.5/500 one p.o. q.8h. p.r.n. #50 with no refill. 3. Followup with me in six weeks. 4. Continue with chiropractic therapy. DD:  03/04/00 TD:  03/05/00 Job: [id number]

## 2010-12-29 NOTE — Consult Note (Signed)
[facility]  Patient:    [name], [name]                      MRN: [id number] Proc. Date: 01/22/00 Adm. Date:  [id number] Attending:  Nicholaus Bloom CC:         Kern Alberta, M.D., [place], [place].                          Consultation Report  FOLLOW-UP EVALUATION  HISTORY OF PRESENT ILLNESS:  [name] comes in for follow-up evaluation of his neck pain on the basis of cervical spondylosis, associated headaches as well as underlying degenerative disk disease of the lumbar spine. Since his last evaluation, the patient has noted that the Zanaflex is not very beneficial. He has continued with chiropractic therapy which he feels at best is mildly improving his discomfort. He continues on his Lotensin and Toprol. He is using Tylenol for his pain control. He is under a lot of stress with his family, his mother and brothers seem to be taxing emotionally. In addition, his father is moving into the area and apparently does not get along with his mother. He is working hard. He notes that his neck is improved by heat and quite. He notes that his neck is increased by stress or when he first gets up in the morning from being inactive through the night.  PHYSICAL EXAMINATION:  VITAL SIGNS:  Blood pressure 139/70, heart rate 69, respiratory rate 16, O2 saturations 97% and pain level is 7/10. Temperature is 97.7.  Neck demonstrated ongoing limitations in range of motion to a mild extent. Deep tendon reflexes were symmetric in the upper extremities. Spurling sign is negative. The lower extremities demonstrate symmetric deep tendon reflexes with negative straight leg raise signs.  NECK: 1. Pain on the basis of of cervical spondylosis with associated headaches. 2. Lumbar degenerative disk disease. 3. Other medical problems per Dr. Olena Heckle.  DISPOSITION: 1. Resume Xanax 2 mg 1.5-1 tablet p.o. q. 8h p.r.n. 2. Vicodin 5/500 1 p.o. q. 8h p.r.n. severe pain #50 with no  refill. 3. Followup with me in 6 weeks. 4. Continue with chiropractic therapy. DD:  01/22/00 TD:  01/22/00 Job: [id number]

## 2010-12-29 NOTE — H&P (Signed)
[facility]  Patient:    [name], [name]                      MRN: [id number] Adm. Date:  [id number] Attending:  Nicholaus Bloom CC:         Mitzie Na. Quillian Quince, M.D., [place], [place]   History and Physical  FOLLOWUP EVALUATION:  The patient comes in for followup evaluation of his chronic neck pain on the basis of cervical spondylosis and myofascial pain. Since his last evaluation, he was found to be anemic and he has undergone extensive evaluations.  He apparently also had a urinary problem.  He has been endoscoped, colonoscoped, cystoscoped and apparently has had extensive laboratory investigations, all of which have been negative to date.  He did go off of his Lotensin.  He presents today with his neck and interscapular pain at a level of 5/10.  It is improved by heat, the chiropractor and made worse by overexertion and stress.  CURRENT MEDICATIONS: 1. Hydrocodone 7.5/500 mg one p.o. q.8h. p.r.n. 2. Gemfibrozil. 3. Toprol. 4. Xanax 2 mg one-half to one p.o. q.8h. p.r.n.  PHYSICAL EXAMINATION:  VITAL SIGNS:  Temperature is 96.3.  Blood pressure is 118/70.  Heart rate is 58.  Respiratory rate is 16.  O2 saturation is 99%.  Pain level is 5/10.  MUSCULOSKELETAL:  He demonstrates fairly good range of motion, with deep tendon reflexes symmetric in the upper extremities.  He has tenderness at about C3 over the facet joints.  IMPRESSION: 1. Cervical spondylosis with underlying neck pain. 2. Other medical problems per Dr. Mitzie Na. Daniel.  DISPOSITION: 1. Continue on hydrocodone 7.5/500 mg 1 p.o. q.8h. p.r.n., #50 with 2 refills. 2. Trial of Lidoderm patch to his neck, on 12 hours/off 12 hours per day. 3. Continue with Xanax 2 mg 1/2 to 1 p.o. q.8h. p.r.n., #100 with 2 refills. 4. Follow up with me in three months. DD:  01/01/01 TD:  01/02/01 Job: [id number]

## 2011-02-01 ENCOUNTER — Encounter (INDEPENDENT_AMBULATORY_CARE_PROVIDER_SITE_OTHER): Payer: 59

## 2011-02-01 ENCOUNTER — Ambulatory Visit (INDEPENDENT_AMBULATORY_CARE_PROVIDER_SITE_OTHER): Payer: 59 | Admitting: Vascular Surgery

## 2011-02-01 DIAGNOSIS — N186 End stage renal disease: Secondary | ICD-10-CM

## 2011-02-01 DIAGNOSIS — Z01818 Encounter for other preprocedural examination: Secondary | ICD-10-CM

## 2011-02-01 DIAGNOSIS — N184 Chronic kidney disease, stage 4 (severe): Secondary | ICD-10-CM

## 2011-02-02 NOTE — Assessment & Plan Note (Signed)
OFFICE VISIT  [name], [name] DOB:  06/21/1951                                       02/01/2011 [id number]  CHIEF COMPLAINT:  Needs hemodialysis access.  HISTORY OF PRESENT ILLNESS:  The patient is a 60 year old male referred by Dr. Posey Pronto for placement of long-term hemodialysis access.  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  He also has a history of hypertension, elevated cholesterol.  He currently is not on dialysis.  He is right-handed.  PAST MEDICAL HISTORY:  Is as listed above.  PAST SURGICAL HISTORY:  Knee arthroscopy and inguinal hernia repair.  SOCIAL HISTORY:  He is married.  He has 2 children.  He is disabled.  He occasionally smokes a cigar.  He does not consume alcohol regularly.  FAMILY HISTORY:  His father had multiple cardiac valve replacements and congestive failure.  REVIEW OF SYSTEMS:  He has had some recent weight gain.  He is 6 feet 4 inches, 209 pounds. CARDIAC:  He has occasional chest pain. GI:  He has a history of reflux, hiatal hernia. NEUROLOGIC:  He has occasional dizziness and headaches. URINARY:  He has kidney disease as listed above. ENT:  He has had some decline in his eyesight. MUSCULOSKELETAL:  He has joint and arthritis pain. PSYCHIATRIC:  He has mild depression and anxiety.  Several pages of medical records from Dr. Posey Pronto were reviewed and this was remarkable primarily for recent creatinine of 4.5 with a BUN of 59.  MEDICATIONS:  Diazepam, carvedilol, amlodipine, simvastatin, furosemide, oxycodone, hydralazine, Flexeril, prednisone, acetaminophen, Lantus insulin, NovoLog insulin, omeprazole, metolazone, Dialyvite, Cymbalta and glipizide.  ALLERGIES:  He has an allergy listed to Zaroxolyn.  PHYSICAL EXAMINATION:  Vital signs:  Blood pressure is 114/71 in the left arm, heart rate 65 and regular, oxygen  saturation 99% on room air. HEENT:  Unremarkable.  Chest:  Clear to auscultation.  Cardiac:  Regular rate and rhythm without murmur.  Musculoskeletal:  Shows no obvious major joint deformities.  Neurological:  He has symmetric upper extremity and lower extremity motor strength which is 5/5 and symmetric. No sensory deficits.  Skin:  Has no open ulcers or rashes.  He had a vein mapping ultrasound today which I reviewed and interpreted. This shows bilateral cephalic vein greater than 3 mm in diameter throughout its entire course.  The basilic vein is also greater than 3 mm throughout its course.  His upper extremity exam showed bilateral 2+ brachial and radial pulses. There is an easily palpable cephalic vein in the left forearm on palpation with placement of a tourniquet.  I believe the best option for the patient would be placing a left radiocephalic AV fistula.  Risks, benefits, possible complications and procedure details were explained to the patient today including but not limited to bleeding, infection, ischemic steal, numbness, tingling around the area of the snuffbox in the left hand.  He understands and agrees to proceed.  He is scheduled for 02/06/2011.  Also discussed with the patient nonmaturation of the fistula, rate of 10%-15%.    Jessy Oto. Issabela Lesko, MD Electronically Signed  CEF/MEDQ  D:  02/01/2011  T:  02/02/2011  Job:  [id number]  cc:   Elmarie Shiley, MD

## 2011-02-06 ENCOUNTER — Ambulatory Visit (HOSPITAL_COMMUNITY)
Admission: RE | Admit: 2011-02-06 | Discharge: 2011-02-06 | Disposition: A | Discharge status: Home / Self Care | Payer: 59 | Source: Ambulatory Visit | Attending: Vascular Surgery | Admitting: Vascular Surgery

## 2011-02-06 DIAGNOSIS — N058 Unspecified nephritic syndrome with other morphologic changes: Secondary | ICD-10-CM | POA: Insufficient documentation

## 2011-02-06 DIAGNOSIS — I120 Hypertensive chronic kidney disease with stage 5 chronic kidney disease or end stage renal disease: Secondary | ICD-10-CM

## 2011-02-06 DIAGNOSIS — Z794 Long term (current) use of insulin: Secondary | ICD-10-CM | POA: Insufficient documentation

## 2011-02-06 DIAGNOSIS — IMO0002 Reserved for concepts with insufficient information to code with codable children: Secondary | ICD-10-CM | POA: Insufficient documentation

## 2011-02-06 DIAGNOSIS — F17200 Nicotine dependence, unspecified, uncomplicated: Secondary | ICD-10-CM | POA: Insufficient documentation

## 2011-02-06 DIAGNOSIS — N186 End stage renal disease: Secondary | ICD-10-CM | POA: Insufficient documentation

## 2011-02-06 DIAGNOSIS — T380X5A Adverse effect of glucocorticoids and synthetic analogues, initial encounter: Secondary | ICD-10-CM | POA: Insufficient documentation

## 2011-02-06 DIAGNOSIS — E1329 Other specified diabetes mellitus with other diabetic kidney complication: Secondary | ICD-10-CM | POA: Insufficient documentation

## 2011-02-06 DIAGNOSIS — E785 Hyperlipidemia, unspecified: Secondary | ICD-10-CM | POA: Insufficient documentation

## 2011-02-06 DIAGNOSIS — Z79899 Other long term (current) drug therapy: Secondary | ICD-10-CM | POA: Insufficient documentation

## 2011-02-06 LAB — POCT I-STAT 4, (NA,K, GLUC, HGB,HCT)
Hemoglobin: 10.2 g/dL — ABNORMAL LOW (ref 13.0–17.0)
Sodium: 146 mEq/L — ABNORMAL HIGH (ref 135–145)

## 2011-02-06 LAB — SURGICAL PCR SCREEN: MRSA, PCR: NEGATIVE

## 2011-02-06 LAB — GLUCOSE, CAPILLARY
Glucose-Capillary: 96 mg/dL (ref 70–99)
Glucose-Capillary: 87 mg/dL (ref 70–99)

## 2011-02-09 NOTE — Procedures (Unsigned)
CEPHALIC VEIN MAPPING  INDICATION:  Stage IV chronic kidney disease.  HISTORY:  EXAM: The right cephalic vein is compressible.  Diameter measurements range from 0.26 to 0.54 cm.  The right basilic vein is compressible.  Diameter measurements range from 0.36 to 0.5 cm.  The left cephalic vein is compressible.  Diameter measurements range from 0.34 to 0.69 cm.  The left basilic vein is compressible.  Diameter measurements range from 0.26 to 0.61 cm.  See attached worksheet for all measurements.  IMPRESSION:  Patent bilateral cephalic and basilic veins with diameter measurements as described above.  ___________________________________________ Jessy Oto. Fields, MD  CH/MEDQ  D:  02/01/2011  T:  02/01/2011  Job:  [id number]

## 2011-02-26 NOTE — Op Note (Signed)
  [name], Clayton               ACCOUNT NO.:  [id number]  MEDICAL RECORD NO.:  [id number]  LOCATION:  [facility]                         FACILITY:  [facility]  PHYSICIAN:  Jessy Oto. Kasarah Sitts, MD  DATE OF BIRTH:  [birth year]  DATE OF PROCEDURE:  02/06/2011 DATE OF DISCHARGE:  02/06/2011                              OPERATIVE REPORT   PROCEDURE:  Left radiocephalic AV fistula.  PREOPERATIVE DIAGNOSIS:  End-stage renal disease.  POSTOPERATIVE DIAGNOSIS:  End-stage renal disease.  ANESTHESIA:  Local with IV sedation.  ASSISTANT:  Evorn Gong, PA-C  OPERATIVE FINDINGS:  A 3-mm left cephalic vein.  OPERATIVE DETAILS:  After obtaining informed consent, the patient was taken to the operating room.  The patient was placed in supine position on the operating table.  After adequate sedation, the patient's entire left upper extremity was prepped and draped in usual sterile fashion. Local anesthesia was infiltrated midway between the left cephalic vein and radial artery.  A longitudinal incision was made in this location, carried down through subcutaneous tissues down the level of the cephalic vein.  Cephalic vein was dissected free circumferentially and small side branches ligated and divided between with silk ties or clips.  Vein was approximately 3 mm in diameter.  Next, a radial artery was dissected free circumferentially in the medial portion of the incision.  Radial artery was initially approximately 3 mm in diameter, however, there was some spasm that developed.  The artery was dissected free circumferentially and vessel loops were placed proximal and distal in the planned site of arteriotomy.  The patient is given 5000 units of intravenous heparin.  Vessel loops were used to control the artery.  The distal cephalic vein was ligated with 2-0 silk tie and the vein transected and swung over the level of the artery.  The vein was gently distended with heparinized saline and marked for  orientation.  A longitudinal opening was made in the radial artery.  The vein sewn end of vein to side of artery using running 7-0 Prolene suture.  Just prior to completion of anastomosis, it was fore bled, back bled, and thoroughly flushed.  Anastomosis was secured.  Vessel loops were released.  There was a palpable thrill in the fistula immediately. Next, hemostasis was obtained.  Subcutaneous tissues were reapproximated using running 3-0 Vicryl suture.  Skin was closed with 4-0 Vicryl subcuticular stitch and Dermabond applied to the incision.  The patient tolerated the procedure well and there were no complications.  Instrument, sponge, and needle count was correct at the end of the case.  The patient taken to recovery room in stable condition.     Jessy Oto. Zacariah Belue, MD     CEF/MEDQ  D:  02/06/2011  T:  02/07/2011  Job:  [id number]  Electronically Signed by [name] MD on 02/26/2011 09:49:42 AM

## 2011-03-15 ENCOUNTER — Ambulatory Visit (INDEPENDENT_AMBULATORY_CARE_PROVIDER_SITE_OTHER): Payer: 59

## 2011-03-15 DIAGNOSIS — N186 End stage renal disease: Secondary | ICD-10-CM

## 2011-03-15 NOTE — Assessment & Plan Note (Signed)
OFFICE VISIT  [name], [name] DOB:  [birth year]                                       03/15/2011 [id number]  DATE OF SURGERY:  February 06, 2011  Patient returns today for follow-up from his left radiocephalic AV fistula.  He states that he has been doing well.  He denies any symptoms of steal.  He does have some numbness along his left thumb.  However, he states that this has been slowly getting better.  PHYSICAL EXAMINATION:  Vital signs:  Blood pressure 152/81 in the right arm, heart rate is 68 and regular.  He is 100% on room air.  His incision is well healed.  He does have a spitting stitch, which I cut for him.  He has good motor sensation of his hand.  He has a 2+ radial pulse and a good thrill.  The patient is not yet on hemodialysis and should not be in the foreseeable future.  I have instructed the patient he can return p.r.n. He continues to do his arm exercises to mature the fistula more so. Otherwise, he is doing well, and we will see him back p.r.n.  Evorn Gong, PA  Charles E. Fields, MD Electronically Signed  SE/MEDQ  D:  03/15/2011  T:  03/15/2011  Job:  [id number]

## 2011-05-17 DIAGNOSIS — H60399 Other infective otitis externa, unspecified ear: Secondary | ICD-10-CM | POA: Insufficient documentation

## 2011-07-09 ENCOUNTER — Ambulatory Visit (HOSPITAL_COMMUNITY): Payer: 59 | Admitting: Psychology

## 2011-08-01 ENCOUNTER — Ambulatory Visit (INDEPENDENT_AMBULATORY_CARE_PROVIDER_SITE_OTHER): Payer: 59 | Admitting: Psychology

## 2011-08-01 DIAGNOSIS — F4321 Adjustment disorder with depressed mood: Secondary | ICD-10-CM

## 2011-09-05 DIAGNOSIS — IMO0002 Reserved for concepts with insufficient information to code with codable children: Secondary | ICD-10-CM | POA: Insufficient documentation

## 2011-09-07 ENCOUNTER — Inpatient Hospital Stay (HOSPITAL_COMMUNITY): Admission: RE | Admit: 2011-09-07 | Payer: 59 | Source: Ambulatory Visit | Admitting: Occupational Therapy

## 2011-09-10 ENCOUNTER — Encounter (HOSPITAL_COMMUNITY): Payer: Self-pay | Admitting: Psychology
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
## 2011-09-14 ENCOUNTER — Ambulatory Visit (HOSPITAL_COMMUNITY)
Admission: RE | Admit: 2011-09-14 | Discharge: 2011-09-14 | Disposition: A | Discharge status: Home / Self Care | Payer: 59 | Source: Ambulatory Visit | Attending: Family Medicine | Admitting: Family Medicine

## 2011-09-14 DIAGNOSIS — IMO0001 Reserved for inherently not codable concepts without codable children: Secondary | ICD-10-CM | POA: Insufficient documentation

## 2011-09-14 DIAGNOSIS — M25619 Stiffness of unspecified shoulder, not elsewhere classified: Secondary | ICD-10-CM | POA: Insufficient documentation

## 2011-09-14 DIAGNOSIS — M25519 Pain in unspecified shoulder: Secondary | ICD-10-CM | POA: Insufficient documentation

## 2011-09-14 DIAGNOSIS — M6281 Muscle weakness (generalized): Secondary | ICD-10-CM | POA: Insufficient documentation

## 2011-09-14 NOTE — Evaluation (Signed)
Occupational Therapy Evaluation  Patient Details  Name: [name] MRN: [id number] Date of Birth: 20-Nov-1950  Today's Date: 09/14/2011 Time: [id number] Evaluation [id number] 45' Patient Education  15' Time Calculation (min): 60 min  Visit#: 1  of 16   Re-eval: 10/12/11  Assessment Diagnosis: Bilateral Shoulder pain Surgical Date:  (N/A) Next MD Visit:  (2 months from today) Prior Therapy:  (None)  Past Medical History: No past medical history on file. Past Surgical History: No past surgical history on file.  Subjective Symptoms/Limitations Symptoms: " I get a sharp pain when I try to move in different directions. It gets worse at night and I can't sleep and sometimes I can't raise my arm. I have difficulty getting keys out of my pocket and it hurts." Limitations: [name] is a 61 yr. 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 He is not currently on dialysis and  he has started the process to get a new kidney. His current pain in the shoulders began gradually over last 2 months. Pain Assessment Currently in Pain?: Yes Pain Score:   3 Pain Location: Shoulder Pain Orientation: Right;Left Pain Type: Acute pain Pain Onset: More than a month ago Pain Frequency: Constant Pain Relieving Factors: Patient does rowing on a rowing machine and some weights at home. Movement helps it to feel better. Effect of Pain on Daily Activities: working above his head; vacuuming, sweeping and mopping the floors. Reaching into a pocket.Reaching the seatbelt is difficult. Multiple Pain Sites: No  Precautions/Restrictions     Prior Functioning  Home Living Lives With: Spouse Receives Help From: Family Type of Home: House Home Layout: Other (Comment) (basement level) Home Access: Stairs to enter Entrance Stairs-Rails: Right Entrance Stairs-Number of Steps:  (14) Bathroom Shower/Tub: Tub/shower unit;Door Bathroom Toilet: Standard Bathroom  Accessibility: No Prior Function Level of Independence: Independent with basic ADLs;Independent with homemaking with ambulation;Independent with gait Able to Take Stairs?: Yes Driving: Yes Vocation: On disability Vocation Requirements:  (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.)  Assessment ADL/Vision/Perception Vision - History Baseline Vision: Wears glasses only for reading Patient Visual Report: No change from baseline Vision - Assessment Eye Alignment: Within Functional Limits Vision Assessment: Vision not tested Perception Perception: Within Functional Limits Praxis Praxis: Intact  Cognition/Observation Cognition Overall Cognitive Status: Appears within functional limits for tasks assessed Arousal/Alertness: Awake/alert Orientation Level: Oriented X4  Sensation/Coordination/Edema Sensation Light Touch: Appears Intact Coordination Gross Motor Movements are Fluid and Coordinated: Yes Fine Motor Movements are Fluid and Coordinated: Yes Edema Edema: none  Additional Assessments RUE AROM (degrees) Right Shoulder Extension  0-60: 60 Degrees Right Shoulder Flexion  0-170: 155 Degrees Right Shoulder ABduction 0-140: 135 Degrees Right Shoulder Internal Rotation  0-70: 75 Degrees Right Shoulder External Rotation  0-90: 65 Degrees Right Elbow Flexion/Extension 0-135-150: 105  RUE PROM (degrees) Right Shoulder Flexion  0-170: 170 Degrees Right Shoulder ABduction 0-140: 140 Degrees RUE Strength Right Shoulder Flexion: 4/5 Right Shoulder Extension: 4/5 Right Shoulder ABduction: 4/5 Right Shoulder Internal Rotation: 5/5 Right Shoulder External Rotation: 5/5 Right Elbow Flexion: 4/5 Right Elbow Extension: 4/5 LUE Assessment LUE Assessment: Exceptions to [facility] LUE AROM (degrees) Left Shoulder Extension  0-60: 75 Degrees Left Shoulder Flexion  0-170: 160 Degrees Left Shoulder ABduction 0-40: 155 Degrees Left Shoulder Internal Rotation  0-70: 75  Degrees Left Shoulder External Rotation  0-90: 70 Degrees Left Elbow Flexion/Extension 0-135-150: 126  LUE Strength Left Shoulder Flexion: 5/5 Left  Shoulder Extension: 5/5 Left Shoulder ABduction: 5/5 Left Shoulder Internal Rotation: 5/5 Left Shoulder External Rotation: 5/5       Occupational Therapy Assessment and Plan OT Assessment and Plan Clinical Impression Statement: P: [name] is a 61 y/o male with gradual onset of bilateral shoulder pain and decreased mobility  and weakness over the past two months.  He is unable to reach above his head without pain and discomfort and  is currently unable to do houshold chores due to decreased mobility strength and  increased pain. Rehab Potential: Excellent OT Frequency: Min 3X/week OT Duration: 8 weeks OT Treatment/Interventions: Self-care/ADL training;Therapeutic exercise;Therapeutic activities;Manual therapy;Modalities;Patient/family education OT Plan: P: OT 2-3 x week x 8 weeks to decrease pain, increase mobility and increase function and strength.   Goals Home Exercise Program Pt will Perform Home Exercise Program: Independently PT Goal: Perform Home Exercise Program - Progress: Goal set today Short Term Goals Time to Complete Short Term Goals: 2 weeks Short Term Goal 1: Patient independent with HEP for Strenghtening and stretching. Short Term Goal 2: Patient will state 2/10 pain with movement and use. Short Term Goal 3: Pateint will be able to sleep through the night without pain. Short Term Goal 4: Patient will regain motion of the RUE shoulder equal to  range of LUE shoulder  Short Term Goal 5: Pateint will be able to perform shest hieght activity without pain. Long Term Goals Time to Complete Long Term Goals: 4 weeks Long Term Goal 1: 1/10 pain in both shoulders. Long Term Goal 2: 5/5 strength in RUE shoulder complex. Long Term Goal 3: Pateint will be able to rach items above head without pain  Long Term Goal 4: Pateint will  resume abilityt to reach seatbelt to put it on without pain or restrictions Long Term Goal 5: Pateint will resume light hoousehold acitivities   Problem List Patient Active Problem List  Diagnoses  . Pain in joint, shoulder region    End of Session Activity Tolerance: Patient tolerated treatment well General Behavior During Session: [facility] for tasks performed Cognition: [facility] for tasks performed OT Plan of Care OT Home Exercise Plan: PROM of BUE shoulders in Flex and Abd. Consulted and Agree with Plan of Care: Patient   Thomasenia Sales OTR/L 09/14/2011, 5:22 PM  Physician Documentation Your signature is required to indicate approval of the treatment plan as stated above.  Please sign and either send electronically or make a copy of this report for your files and return this physician signed original.  Please mark one 1.__approve of plan  2. ___approve of plan with the following conditions.   ______________________________                                                          _____________________ Physician Signature  Date  

## 2011-09-17 ENCOUNTER — Ambulatory Visit (HOSPITAL_COMMUNITY)
Admission: RE | Admit: 2011-09-17 | Discharge: 2011-09-17 | Disposition: A | Discharge status: Home / Self Care | Payer: 59 | Source: Ambulatory Visit | Attending: Family Medicine | Admitting: Family Medicine

## 2011-09-17 DIAGNOSIS — M25519 Pain in unspecified shoulder: Secondary | ICD-10-CM

## 2011-09-19 ENCOUNTER — Ambulatory Visit (HOSPITAL_COMMUNITY)
Admission: RE | Admit: 2011-09-19 | Discharge: 2011-09-19 | Disposition: A | Discharge status: Home / Self Care | Payer: 59 | Source: Ambulatory Visit | Attending: Family Medicine | Admitting: Family Medicine

## 2011-09-19 DIAGNOSIS — M25519 Pain in unspecified shoulder: Secondary | ICD-10-CM

## 2011-09-19 NOTE — Progress Notes (Signed)
Occupational Therapy Treatment  Patient Details  Name: [name] MRN: [id number] Date of Birth: 1951-03-31  Today's Date: 09/19/2011 Time: [id number] Time Calculation (min): 47 min  Visit#: 2  of 16   Re-eval: 10/12/11 Manual Therapy 149-207 18' Therapeutic Exercise 208-236 28'   Subjective Symptoms/Limitations Symptoms: S:  It hurts when I reach behind my back Pain Assessment Currently in Pain?: Yes Pain Score:   3 Pain Location: Shoulder Pain Orientation: Left Pain Type: Acute pain Pain Frequency: Intermittent  Precautions/Restrictions     Exercise/Treatments Supine Protraction: AAROM;12 reps Horizontal ABduction: AAROM;12 reps External Rotation: AAROM;12 reps Internal Rotation: AAROM;12 reps Flexion: AAROM;12 reps ABduction: AAROM;12 reps Therapy Ball Flexion: 25 reps ABduction: 25 reps Right/Left: 5 reps ROM / Strengthening / Isometric Strengthening UBE (Upper Arm Bike): 3' forward 3' backward 1.5 Wall Wash: 2       Manual Therapy Manual Therapy: Myofascial release Myofascial Release: MFR and manual stretching to decrease restrictions and pain to allow for pain free AROM.  Occupational Therapy Assessment and Plan OT Assessment and Plan Clinical Impression Statement: P:  Added ball circles. Rehab Potential: Excellent OT Plan: P: Attempt AROM supine.   Goals Home Exercise Program Pt will Perform Home Exercise Program: Independently Short Term Goals Time to Complete Short Term Goals: 2 weeks Short Term Goal 1: Patient independent with HEP for Strenghtening and stretching. Short Term Goal 2: Patient will state 2/10 pain with movement and use. Short Term Goal 3: Pateint will be able to sleep through the night without pain. Short Term Goal 4: Patient will regain motion of the RUE shoulder equal to  range of LUE shoulder  Short Term Goal 5: Pateint will be able to perform shest hieght activity without pain. Long Term Goals Time to Complete Long Term  Goals: 4 weeks Long Term Goal 1: 1/10 pain in both shoulders. Long Term Goal 2: 5/5 strength in RUE shoulder complex. Long Term Goal 3: Pateint will be able to rach items above head without pain  Long Term Goal 4: Pateint will resume abilityt to reach seatbelt to put it on without pain or restrictions Long Term Goal 5: Pateint will resume light hoousehold acitivities   Problem List Patient Active Problem List  Diagnoses  . Pain in joint, shoulder region    End of Session Activity Tolerance: Patient tolerated treatment well General Cognition: [facility] for tasks performed   Shereka Lafortune L. Elenor Wildes, COTA/L  09/19/2011, 3:10 PM

## 2011-09-24 ENCOUNTER — Ambulatory Visit (HOSPITAL_COMMUNITY)
Admission: RE | Admit: 2011-09-24 | Discharge: 2011-09-24 | Disposition: A | Discharge status: Home / Self Care | Payer: 59 | Source: Ambulatory Visit | Attending: Family Medicine | Admitting: Family Medicine

## 2011-09-24 DIAGNOSIS — M25519 Pain in unspecified shoulder: Secondary | ICD-10-CM

## 2011-09-24 NOTE — Progress Notes (Addendum)
Occupational Therapy Treatment  Patient Details  Name: [name] MRN: [id number] Date of Birth: 01/14/1951  Today's Date: 09/24/2011 Time: [id number] Time Calculation (min): 60 min  Visit#: 3  of 16   Re-eval: 10/12/11 Manual Therapy 108-129 21' Therapeutic Exercise 130-206 36'    Subjective Symptoms/Limitations Symptoms: S:  I did something I shouldn't have this weekend, I took a dishwasher out and installed a new one and now I am paying for it. Pain Assessment Currently in Pain?: Yes Pain Score:   4 Pain Location: Shoulder Pain Orientation: Right Pain Radiating Towards: right worse than left today.  Precautions/Restrictions     Exercise/Treatments Supine Protraction: AROM;12 reps Horizontal ABduction: AROM;12 reps External Rotation: AROM;12 reps Internal Rotation: AROM;12 reps Flexion: AROM;12 reps ABduction: AROM;12 reps Seated Extension: Strengthening;15 reps;Theraband Theraband Level (Shoulder Extension): Level 3 (Green) Retraction: Strengthening;15 reps;Theraband Theraband Level (Shoulder Retraction): Level 3 (Green) Row: Strengthening;15 reps;Theraband Theraband Level (Shoulder Row): Level 3 (Green) Protraction: AROM;10 reps Horizontal ABduction: AROM;10 reps External Rotation: AROM;10 reps Internal Rotation: PROM;10 reps Flexion: AROM;10 reps Abduction: AROM;10 reps Therapy Ball Flexion: 25 reps ABduction: 25 reps Right/Left: 5 reps ROM / Strengthening / Isometric Strengthening UBE (Upper Arm Bike): 3' forward 3' backward 2.0 Wall Wash: 3      Manual Therapy Manual Therapy: Myofascial release Myofascial Release: MFR and manual stretching to decrease restrictions and pain to allow for pain free AROM  Occupational Therapy Assessment and Plan OT Assessment and Plan Clinical Impression Statement: P: Added seated AROM. Rehab Potential: Excellent OT Plan: P: Add 1# to supine exercises.   Goals Home Exercise Program Pt will Perform Home  Exercise Program: Independently Short Term Goals Time to Complete Short Term Goals: 2 weeks Short Term Goal 1: Patient independent with HEP for Strenghtening and stretching. Short Term Goal 2: Patient will state 2/10 pain with movement and use. Short Term Goal 3: Pateint will be able to sleep through the night without pain. Short Term Goal 4: Patient will regain motion of the RUE shoulder equal to  range of LUE shoulder  Short Term Goal 5: Pateint will be able to perform shest hieght activity without pain. Long Term Goals Time to Complete Long Term Goals: 4 weeks Long Term Goal 1: 1/10 pain in both shoulders. Long Term Goal 2: 5/5 strength in RUE shoulder complex. Long Term Goal 3: Pateint will be able to rach items above head without pain  Long Term Goal 4: Pateint will resume abilityt to reach seatbelt to put it on without pain or restrictions Long Term Goal 5: Pateint will resume light hoousehold acitivities   Problem List Patient Active Problem List  Diagnoses  . Pain in joint, shoulder region    End of Session Activity Tolerance: Patient tolerated treatment well General Behavior During Session: [facility] for tasks performed Cognition: [facility] for tasks performed   Ruthanna Macchia L. Thera Flake, COTA/L  09/24/2011, 5:28 PM

## 2011-09-26 ENCOUNTER — Ambulatory Visit (HOSPITAL_COMMUNITY)
Admission: RE | Admit: 2011-09-26 | Discharge: 2011-09-26 | Disposition: A | Discharge status: Home / Self Care | Payer: 59 | Source: Ambulatory Visit | Attending: Family Medicine | Admitting: Family Medicine

## 2011-09-26 DIAGNOSIS — M25519 Pain in unspecified shoulder: Secondary | ICD-10-CM

## 2011-09-26 NOTE — Progress Notes (Signed)
Occupational Therapy Treatment  Patient Details  Name: [name] MRN: [id number] Date of Birth: 02/25/1951  Today's Date: 09/26/2011 Time: [id number] Manual Therapy [id number] 10' Therapeutic Exercise [id number]   33' Time Calculation (min): 43 min  Visit#: 4  of 16   Re-eval: 10/12/11    Subjective Symptoms/Limitations Symptoms: S: It seemed to hurt first thing this morning but is better now. Repetition: Decreases Symptoms Pain Assessment Currently in Pain?: Yes Pain Score:   2 Pain Location: Shoulder Pain Orientation: Left;Right Pain Type: Acute pain Pain Onset: More than a month ago Pain Frequency: Intermittent Multiple Pain Sites: No  Precautions/Restrictions     Exercise/Treatments Supine Protraction: AAROM;10 reps Horizontal ABduction: AAROM;10 reps External Rotation: AAROM;10 reps Internal Rotation: AAROM;10 reps Flexion: AAROM;10 reps ABduction: AAROM;10 reps Seated Elevation: Strengthening;10 reps;Theraband Theraband Level (Shoulder Elevation): Level 3 (Green) Extension: Strengthening;15 reps;Theraband Theraband Level (Shoulder Extension): Level 3 (Green) Retraction: Strengthening;15 reps;Theraband Theraband Level (Shoulder Retraction): Level 3 (Green) Row: Strengthening;15 reps;Theraband Theraband Level (Shoulder Row): Level 3 (Green) Protraction: AROM;15 reps Horizontal ABduction: AROM;15 reps External Rotation: Strengthening;15 reps;Theraband Theraband Level (Shoulder External Rotation): Level 3 (Green) Internal Rotation: Strengthening;15 reps;Theraband Theraband Level (Shoulder Internal Rotation): Level 3 (Green) Flexion: AROM;10 reps Abduction: AROM;10 reps Therapy Ball Flexion: 25 reps ABduction: 25 reps Right/Left: 5 reps ROM / Strengthening / Isometric Strengthening UBE (Upper Arm Bike): 3' forward 3' backward 2.0 Wall Wash: 3      Manual Therapy Manual Therapy: Joint mobilization Joint Mobilization: Gentle joint mobilization to  end range of Flexion and Abd.  Occupational Therapy Assessment and Plan OT Assessment and Plan Clinical Impression Statement: P: Added 1 lb wt. flex abd to 90 degrees and increased reps with tband. Pateint's pain went down from 2-3/10 to 1/10 with movement and strengthening.  Rehab Potential: Excellent OT Frequency: Min 3X/week OT Duration: 8 weeks OT Treatment/Interventions: Self-care/ADL training;Therapeutic exercise;Manual therapy;Therapeutic activities;Patient/family education OT Plan: P: Add 1 lb in supine. all movement.   Goals    Problem List Patient Active Problem List  Diagnoses  . Pain in joint, shoulder region    End of Session Activity Tolerance: Patient tolerated treatment well General Behavior During Session: [facility] for tasks performed Cognition: [facility] for tasks performed   Thomasenia Sales OTR/L 09/26/2011, 1:49 PM

## 2011-10-01 ENCOUNTER — Ambulatory Visit (HOSPITAL_COMMUNITY)
Admission: RE | Admit: 2011-10-01 | Discharge: 2011-10-01 | Disposition: A | Discharge status: Home / Self Care | Payer: 59 | Source: Ambulatory Visit | Attending: Family Medicine | Admitting: Family Medicine

## 2011-10-01 DIAGNOSIS — M25519 Pain in unspecified shoulder: Secondary | ICD-10-CM

## 2011-10-01 NOTE — Progress Notes (Signed)
Occupational Therapy Treatment  Patient Details  Name: [name] MRN: [id number] Date of Birth: [birth year]  Today's Date: 10/01/2011 Time: [id number] Time Calculation (min): 43 min  Visit#: 5  of 16   Re-eval: 10/12/11 Manual Therapy 109-126 17 Therapeutic Exercise 127-152     Subjective Symptoms/Limitations Symptoms: S:  I was doing ok until Sat.  I did hand weight, I used a 15lb. Pain Assessment Currently in Pain?: Yes Pain Score:   1 Pain Location: Shoulder Pain Orientation: Left Pain Radiating Towards: left worse than right today.  Precautions/Restrictions     Exercise/Treatments Supine Protraction: PROM;Strengthening;10 reps Horizontal ABduction: PROM;Strengthening;10 reps External Rotation: PROM;Strengthening;10 reps Internal Rotation: PROM;Strengthening;10 reps Flexion: PROM;Strengthening;10 reps ABduction: PROM;Strengthening;10 reps Seated Extension: Strengthening;15 reps;Theraband Theraband Level (Shoulder Extension): Level 3 (Green) Retraction: Strengthening;15 reps;Theraband Theraband Level (Shoulder Retraction): Level 3 (Green) Row: Strengthening;15 reps;Theraband Theraband Level (Shoulder Row): Level 3 (Green) Protraction: AROM;15 reps Horizontal ABduction: AROM;15 reps External Rotation: AROM;15 reps Theraband Level (Shoulder External Rotation): Level 3 (Green) Internal Rotation: AROM;15 reps Theraband Level (Shoulder Internal Rotation): Level 3 (Green) Flexion: AROM;15 reps Abduction: AROM;10 reps Therapy Ball Flexion: 25 reps ABduction: 25 reps Right/Left: 5 reps ROM / Strengthening / Isometric Strengthening UBE (Upper Arm Bike): 3' forward 3' backward 2.0 Wall Wash: 3' Thumb Tacks: 1' "W" Arms: x10 X to V Arms: x10  Manual Therapy Manual Therapy: Myofascial release Myofascial Release: MFR and manual stretching to bilateral UE to decrease restrictions and pain to allow for pain free AROM.  Occupational Therapy Assessment and  Plan OT Assessment and Plan Clinical Impression Statement: P:  Added x to v and w-arms. Rehab Potential: Excellent OT Plan: P:  Increase reps with strengthening exercises.   Goals Home Exercise Program Pt will Perform Home Exercise Program: Independently Short Term Goals Time to Complete Short Term Goals: 2 weeks Short Term Goal 1: Patient independent with HEP for Strenghtening and stretching. Short Term Goal 2: Patient will state 2/10 pain with movement and use. Short Term Goal 3: Pateint will be able to sleep through the night without pain. Short Term Goal 4: Patient will regain motion of the RUE shoulder equal to  range of LUE shoulder  Short Term Goal 5: Pateint will be able to perform shest hieght activity without pain. Long Term Goals Time to Complete Long Term Goals: 4 weeks Long Term Goal 1: 1/10 pain in both shoulders. Long Term Goal 2: 5/5 strength in RUE shoulder complex. Long Term Goal 3: Pateint will be able to rach items above head without pain  Long Term Goal 4: Pateint will resume abilityt to reach seatbelt to put it on without pain or restrictions Long Term Goal 5: Pateint will resume light hoousehold acitivities   Problem List Patient Active Problem List  Diagnoses  . Pain in joint, shoulder region    End of Session Activity Tolerance: Patient tolerated treatment well General Behavior During Session: [facility] for tasks performed Cognition: [facility] for tasks performed   Shirlie Enck L. Eivin Mascio, COTA/L  10/01/2011, 3:06 PM

## 2011-10-03 ENCOUNTER — Ambulatory Visit (HOSPITAL_COMMUNITY)
Admission: RE | Admit: 2011-10-03 | Discharge: 2011-10-03 | Disposition: A | Discharge status: Home / Self Care | Payer: 59 | Source: Ambulatory Visit | Attending: Family Medicine | Admitting: Family Medicine

## 2011-10-03 DIAGNOSIS — M25519 Pain in unspecified shoulder: Secondary | ICD-10-CM

## 2011-10-03 NOTE — Progress Notes (Signed)
Occupational Therapy Treatment  Patient Details  Name: [name] MRN: [id number] Date of Birth: 02/09/1951  Today's Date: 10/03/2011 Time: [id number] Time Calculation (min): 48 min  Visit#: 6  of 16   Re-eval: 10/31/11 Manual Therapy 105-122 17' Reassessment 123-133 10' Therapeutic Exercise 134-153 19' Assessment Diagnosis: Bilateral Shoulder pain Surgical Date:  (N/A) Next MD Visit:  (2 months from today) Prior Therapy:  (None)  Subjective Symptoms/Limitations Symptoms: S" I slept better last night" "My pain is dependent on what I am doing." Repetition: Decreases Symptoms Pain Assessment Currently in Pain?: Yes Pain Score:   2 Pain Location: Shoulder Pain Orientation: Right Pain Type: Acute pain Pain Radiating Towards: both equally painful today. Pain Onset: More than a month ago Effect of Pain on Daily Activities: Patient now able to reach seatbelt and into back pocket without pain. Multiple Pain Sites: No  Precautions/Restrictions     Exercise/Treatments Supine Protraction: PROM;10 reps Horizontal ABduction: PROM;10 reps External Rotation: PROM;10 reps Internal Rotation: PROM;10 reps Flexion: PROM;10 reps ABduction: PROM;10 reps Seated Protraction: Strengthening;15 reps Protraction Weight (lbs): 2# Horizontal ABduction: Strengthening;15 reps Horizontal ABduction Weight (lbs): 2# External Rotation: Strengthening;15 reps External Rotation Weight (lbs): 2# Internal Rotation: Strengthening;15 reps Internal Rotation Weight (lbs): 2# Flexion: Strengthening;15 reps Flexion Weight (lbs): 2# Abduction: Strengthening;15 reps ABduction Weight (lbs): 2# Therapy Ball Flexion: 25 reps ABduction: 25 reps Right/Left: 5 reps ROM / Strengthening / Isometric Strengthening UBE (Upper Arm Bike): 3' forward 3' backward 2.0 Wall Wash: 4' Thumb Tacks: 1' "W" Arms: x10 with 2# X to V Arms: x10 with 2# Proximal Shoulder Strengthening, Seated: x20       Manual  Therapy Manual Therapy: Myofascial release Myofascial Release: MFR and manual stretching to bilateral UE to decrease restrictions and pain to allow for pain free AROM.  Occupational Therapy Assessment and Plan OT Assessment and Plan Clinical Impression Statement: P:  See progress note.  D/C'd manual with focus this week on increasing hep and strength. Rehab Potential: Excellent OT Frequency: Min 3X/week OT Duration: Other (comment) (one more week) OT Treatment/Interventions: Self-care/ADL training;Therapeutic exercise;Therapeutic activities;Manual therapy;Patient/family education;Modalities OT Plan: P:  Increase reps and discuss current HEP.   Goals Home Exercise Program Pt will Perform Home Exercise Program: Independently Short Term Goals Time to Complete Short Term Goals: 2 weeks Short Term Goal 1: Patient independent with HEP for Strenghtening and stretching. Short Term Goal 1 Progress: Met Short Term Goal 2: Patient will state 2/10 pain with movement and use. Short Term Goal 2 Progress: Met Short Term Goal 3: Pateint will be able to sleep through the night without pain. Short Term Goal 3 Progress: Met Short Term Goal 4: Patient will regain motion of the RUE shoulder equal to  range of LUE shoulder  Short Term Goal 4 Progress: Met Short Term Goal 5: Pateint will be able to perform shest hieght activity without pain. Short Term Goal 5 Progress: Met Long Term Goals Time to Complete Long Term Goals: 4 weeks Long Term Goal 1: 1/10 pain in both shoulders. Long Term Goal 1 Progress: Progressing toward goal Long Term Goal 2: 5/5 strength in RUE shoulder complex. Long Term Goal 2 Progress: Met Long Term Goal 3: Pateint will be able to rach items above head without pain  Long Term Goal 3 Progress: Met Long Term Goal 4: Pateint will resume abilityt to reach seatbelt to put it on without pain or restrictions Long Term Goal 4 Progress: Partly met Long Term Goal 5: Pateint will resume  light hoousehold acitivities  Long Term Goal 5 Progress: Progressing toward goal  Problem List Patient Active Problem List  Diagnoses  . Pain in joint, shoulder region    End of Session Activity Tolerance: Patient tolerated treatment well General Behavior During Session: [facility] for tasks performed Cognition: [facility] for tasks performed  GO No functional reporting required   Cauy Melody L. Thera Flake, COTA/L  10/03/2011, 4:49 PM

## 2011-10-03 NOTE — Evaluation (Signed)
Occupational Therapy Evaluation  Patient Details  Name: [name] MRN: [id number] Date of Birth: 13-Jun-1951  Today's Date: 10/03/2011 Re-Evaluation 15 min   Visit#: 6  of 16   Re-eval: 10/03/11  Assessment Diagnosis: Bilateral Shoulder pain Surgical Date:  (N/A) Next MD Visit:  (2 months from today) Prior Therapy:  (None)  Past Medical History: No past medical history on file. Past Surgical History: No past surgical history on file.  Subjective Symptoms/Limitations Symptoms: S" I slept better last night" "My pain is dependent on what I am doing." Repetition: Decreases Symptoms Pain Assessment Currently in Pain?: Yes Pain Score:   2 Pain Location: Shoulder Pain Orientation: Right Pain Type: Acute pain Pain Radiating Towards: both equally painful today. Pain Onset: More than a month ago Effect of Pain on Daily Activities: Patient now able to reach seatbelt and into back pocket without pain. Multiple Pain Sites: No  Precautions/Restrictions None    Prior Functioning  Home Living Lives With: Spouse Receives Help From: Family Type of Home: House Home Layout: Other (Comment) (basement level) Home Access: Stairs to enter Entrance Stairs-Rails: Right Entrance Stairs-Number of Steps:  (14) Bathroom Shower/Tub: Tub/shower unit;Door Bathroom Toilet: Standard Bathroom Accessibility: No Prior Function Level of Independence: Independent with basic ADLs;Independent with homemaking with ambulation;Independent with gait Able to Take Stairs?: Yes Driving: Yes Vocation: On disability Vocation Requirements:  (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.)  Assessment ADL/Vision/Perception Vision - History Baseline Vision: Wears glasses only for reading Patient Visual Report: No change from baseline Vision - Assessment Eye Alignment: Within Functional Limits Vision Assessment: Vision not tested Perception Perception: Within Functional  Limits Praxis Praxis: Intact  Cognition/Observation Cognition Overall Cognitive Status: Appears within functional limits for tasks assessed Arousal/Alertness: Awake/alert Orientation Level: Oriented X4  Sensation/Coordination/Edema Sensation Light Touch: Appears Intact Coordination Gross Motor Movements are Fluid and Coordinated: Yes Fine Motor Movements are Fluid and Coordinated: Yes Edema Edema: none  Additional Assessments RUE AROM (degrees) Right Shoulder Extension  0-60: 70 Degrees Right Shoulder Flexion  0-170: 180 Degrees Right Shoulder ABduction 0-140: 180 Degrees Right Shoulder Internal Rotation  0-70: 80 Degrees Right Shoulder External Rotation  0-90: 80 Degrees RUE PROM (degrees) Right Shoulder Flexion  0-170: 180 Degrees Right Shoulder ABduction 0-140: 180 Degrees Right Shoulder Internal Rotation  0-70: 80 Degrees Right Shoulder External Rotation  0-90: 80 Degrees RUE Strength Right Shoulder Flexion: 5/5 Right Shoulder Extension: 5/5 Right Shoulder ABduction: 5/5 Right Shoulder Internal Rotation: 5/5 Right Shoulder External Rotation: 5/5 Right Elbow Flexion: 5/5 Right Elbow Extension: 5/5 LUE Assessment LUE Assessment: Exceptions to WFL LUE AROM (degrees) Left Shoulder Extension  0-60: 80 Degrees Left Shoulder Flexion  0-170: 180 Degrees Left Shoulder ABduction 0-40: 180 Degrees Left Shoulder Internal Rotation  0-70: 75 Degrees Left Shoulder External Rotation  0-90: 90 Degrees LUE PROM (degrees) Left Shoulder Extension  0-60: 80 Degrees Left Shoulder Flexion  0-170: 180 Degrees Left Shoulder ABduction 0-40: 180 Degrees Left Shoulder Internal Rotation  0-70: 75 Degrees Left Shoulder External Rotation  0-90: 90 Degrees LUE Strength Left Shoulder Flexion: 5/5 Left Shoulder Extension: 5/5 Left Shoulder ABduction: 5/5 Left Shoulder Internal Rotation: 5/5 Left Shoulder External Rotation: 5/5     Manual Therapy Manual Therapy: Myofascial  release  Occupational Therapy Assessment and Plan OT Assessment and Plan Clinical Impression Statement: P: Patient advancing and has met all STG and 50% of long term goals. Only having pain in Rihgt shoulder and with only lowering arm from over head  reach.  Rehab Potential: Excellent OT Frequency: Min 3X/week OT Duration: Other (comment) (one more week) OT Treatment/Interventions: Self-care/ADL training;Therapeutic exercise;Therapeutic activities;Manual therapy;Patient/family education;Modalities OT Plan: P: Continue OT until March 1st and then discharge with HEP as patient close to meeting all goals.   Goals Home Exercise Program Pt will Perform Home Exercise Program: Independently Short Term Goals Time to Complete Short Term Goals: 2 weeks Short Term Goal 1: Patient independent with HEP for Strenghtening and stretching. Short Term Goal 1 Progress: Met Short Term Goal 2: Patient will state 2/10 pain with movement and use. Short Term Goal 2 Progress: Met Short Term Goal 3: Pateint will be able to sleep through the night without pain. Short Term Goal 3 Progress: Met Short Term Goal 4: Patient will regain motion of the RUE shoulder equal to  range of LUE shoulder  Short Term Goal 4 Progress: Met Short Term Goal 5: Pateint will be able to perform shest hieght activity without pain. Short Term Goal 5 Progress: Met Long Term Goals Time to Complete Long Term Goals: 4 weeks Long Term Goal 1: 1/10 pain in both shoulders. Long Term Goal 1 Progress: Progressing toward goal Long Term Goal 2: 5/5 strength in RUE shoulder complex. Long Term Goal 2 Progress: Met Long Term Goal 3: Pateint will be able to rach items above head without pain  Long Term Goal 3 Progress: Met Long Term Goal 4: Pateint will resume abilityt to reach seatbelt to put it on without pain or restrictions Long Term Goal 4 Progress: Partly met Long Term Goal 5: Pateint will resume light hoousehold acitivities  Long Term Goal  5 Progress: Progressing toward goal  Problem List Patient Active Problem List  Diagnoses  . Pain in joint, shoulder region    End of Session Activity Tolerance: Patient tolerated treatment well General Behavior During Session: [facility] for tasks performed Cognition: [facility] for tasks performed Thomasenia Sales OTR/L 10/03/2011, 1:40 PM  Physician Documentation Your signature is required to indicate approval of the treatment plan as stated above.  Please sign and either send electronically or make a copy of this report for your files and return this physician signed original.  Please mark one 1.__approve of plan  2. ___approve of plan with the following conditions.   ______________________________                                                          _____________________ Physician Signature                                                                                                             Date

## 2011-10-03 NOTE — Evaluation (Signed)
Occupational Therapy Evaluation  Patient Details  Name: [name] MRN: [id number] Date of Birth: August 16, 1950  Today's Date: 10/03/2011 Re-Evaluation 15 min   Visit#: 6  of 16   Re-eval: 10/03/11  Assessment Diagnosis: Bilateral Shoulder pain Surgical Date:  (N/A) Next MD Visit:  (2 months from today) Prior Therapy:  (None)  Past Medical History: No past medical history on file. Past Surgical History: No past surgical history on file.  Subjective Symptoms/Limitations Symptoms: S" I slept better last night" "My pain is dependent on what I am doing." Repetition: Decreases Symptoms Pain Assessment Currently in Pain?: Yes Pain Score:   2 Pain Location: Shoulder Pain Orientation: Right Pain Type: Acute pain Pain Radiating Towards: both equally painful today. Pain Onset: More than a month ago Effect of Pain on Daily Activities: Patient now able to reach seatbelt and into back pocket without pain. Multiple Pain Sites: No  Precautions/Restrictions None    Prior Functioning  Home Living Lives With: Spouse Receives Help From: Family Type of Home: House Home Layout: Other (Comment) (basement level) Home Access: Stairs to enter Entrance Stairs-Rails: Right Entrance Stairs-Number of Steps:  (14) Bathroom Shower/Tub: Tub/shower unit;Door Bathroom Toilet: Standard Bathroom Accessibility: No Prior Function Level of Independence: Independent with basic ADLs;Independent with homemaking with ambulation;Independent with gait Able to Take Stairs?: Yes Driving: Yes Vocation: On disability Vocation Requirements:  (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.)  Assessment ADL/Vision/Perception Vision - History Baseline Vision: Wears glasses only for reading Patient Visual Report: No change from baseline Vision - Assessment Eye Alignment: Within Functional Limits Vision Assessment: Vision not tested Perception Perception: Within Functional  Limits Praxis Praxis: Intact  Cognition/Observation Cognition Overall Cognitive Status: Appears within functional limits for tasks assessed Arousal/Alertness: Awake/alert Orientation Level: Oriented X4  Sensation/Coordination/Edema Sensation Light Touch: Appears Intact Coordination Gross Motor Movements are Fluid and Coordinated: Yes Fine Motor Movements are Fluid and Coordinated: Yes Edema Edema: none  Additional Assessments RUE AROM (degrees) Right Shoulder Extension  0-60: 70 Degrees Right Shoulder Flexion  0-170: 180 Degrees Right Shoulder ABduction 0-140: 180 Degrees Right Shoulder Internal Rotation  0-70: 80 Degrees Right Shoulder External Rotation  0-90: 80 Degrees RUE PROM (degrees) Right Shoulder Flexion  0-170: 180 Degrees Right Shoulder ABduction 0-140: 180 Degrees Right Shoulder Internal Rotation  0-70: 80 Degrees Right Shoulder External Rotation  0-90: 80 Degrees RUE Strength Right Shoulder Flexion: 5/5 Right Shoulder Extension: 5/5 Right Shoulder ABduction: 5/5 Right Shoulder Internal Rotation: 5/5 Right Shoulder External Rotation: 5/5 Right Elbow Flexion: 5/5 Right Elbow Extension: 5/5 LUE Assessment LUE Assessment: Exceptions to WFL LUE AROM (degrees) Left Shoulder Extension  0-60: 80 Degrees Left Shoulder Flexion  0-170: 180 Degrees Left Shoulder ABduction 0-40: 180 Degrees Left Shoulder Internal Rotation  0-70: 75 Degrees Left Shoulder External Rotation  0-90: 90 Degrees LUE PROM (degrees) Left Shoulder Extension  0-60: 80 Degrees Left Shoulder Flexion  0-170: 180 Degrees Left Shoulder ABduction 0-40: 180 Degrees Left Shoulder Internal Rotation  0-70: 75 Degrees Left Shoulder External Rotation  0-90: 90 Degrees LUE Strength Left Shoulder Flexion: 5/5 Left Shoulder Extension: 5/5 Left Shoulder ABduction: 5/5 Left Shoulder Internal Rotation: 5/5 Left Shoulder External Rotation: 5/5     Manual Therapy Manual Therapy: Myofascial  release  Occupational Therapy Assessment and Plan OT Assessment and Plan Clinical Impression Statement: P: Patient advancing and has met all STG and 50% of long term goals. Only having pain in Rihgt shoulder and with only lowering arm from over head  reach.  Rehab Potential: Excellent OT Frequency: Min 3X/week OT Duration: Other (comment) (one more week) OT Treatment/Interventions: Self-care/ADL training;Therapeutic exercise;Therapeutic activities;Manual therapy;Patient/family education;Modalities OT Plan: P: Continue OT until March 1st and then discharge with HEP as patient close to meeting all goals.   Goals Home Exercise Program Pt will Perform Home Exercise Program: Independently Short Term Goals Time to Complete Short Term Goals: 2 weeks Short Term Goal 1: Patient independent with HEP for Strenghtening and stretching. Short Term Goal 1 Progress: Met Short Term Goal 2: Patient will state 2/10 pain with movement and use. Short Term Goal 2 Progress: Met Short Term Goal 3: Pateint will be able to sleep through the night without pain. Short Term Goal 3 Progress: Met Short Term Goal 4: Patient will regain motion of the RUE shoulder equal to  range of LUE shoulder  Short Term Goal 4 Progress: Met Short Term Goal 5: Pateint will be able to perform shest hieght activity without pain. Short Term Goal 5 Progress: Met Long Term Goals Time to Complete Long Term Goals: 4 weeks Long Term Goal 1: 1/10 pain in both shoulders. Long Term Goal 1 Progress: Progressing toward goal Long Term Goal 2: 5/5 strength in RUE shoulder complex. Long Term Goal 2 Progress: Met Long Term Goal 3: Pateint will be able to rach items above head without pain  Long Term Goal 3 Progress: Met Long Term Goal 4: Pateint will resume abilityt to reach seatbelt to put it on without pain or restrictions Long Term Goal 4 Progress: Partly met Long Term Goal 5: Pateint will resume light hoousehold acitivities  Long Term Goal  5 Progress: Progressing toward goal  Problem List Patient Active Problem List  Diagnoses  . Pain in joint, shoulder region    End of Session Activity Tolerance: Patient tolerated treatment well General Behavior During Session: [facility] for tasks performed Cognition: [facility] for tasks performed Thomasenia Sales OTR/L 10/03/2011, 1:41 PM  Physician Documentation Your signature is required to indicate approval of the treatment plan as stated above.  Please sign and either send electronically or make a copy of this report for your files and return this physician signed original.  Please mark one 1.__approve of plan  2. ___approve of plan with the following conditions.   ______________________________                                                          _____________________ Physician Signature                                                                                                             Date

## 2011-10-04 ENCOUNTER — Telehealth (INDEPENDENT_AMBULATORY_CARE_PROVIDER_SITE_OTHER): Payer: Self-pay | Admitting: *Deleted

## 2011-10-04 ENCOUNTER — Other Ambulatory Visit (INDEPENDENT_AMBULATORY_CARE_PROVIDER_SITE_OTHER): Payer: Self-pay | Admitting: *Deleted

## 2011-10-04 ENCOUNTER — Encounter (INDEPENDENT_AMBULATORY_CARE_PROVIDER_SITE_OTHER): Payer: Self-pay | Admitting: *Deleted

## 2011-10-04 DIAGNOSIS — Z1211 Encounter for screening for malignant neoplasm of colon: Secondary | ICD-10-CM

## 2011-10-04 DIAGNOSIS — Z800 Family history of malignant neoplasm of digestive organs: Secondary | ICD-10-CM

## 2011-10-04 MED ORDER — PEG-KCL-NACL-NASULF-NA ASC-C 100 G PO SOLR: 1.0000 | Freq: Once | ORAL | Status: DC

## 2011-10-04 NOTE — Telephone Encounter (Signed)
Patient needs movi prep 

## 2011-10-08 ENCOUNTER — Ambulatory Visit (HOSPITAL_COMMUNITY)
Admission: RE | Admit: 2011-10-08 | Discharge: 2011-10-08 | Disposition: A | Discharge status: Home / Self Care | Payer: 59 | Source: Ambulatory Visit | Attending: Family Medicine | Admitting: Family Medicine

## 2011-10-08 DIAGNOSIS — M25519 Pain in unspecified shoulder: Secondary | ICD-10-CM

## 2011-10-08 NOTE — Progress Notes (Signed)
Occupational Therapy Treatment  Patient Details  Name: ABYAN PEARO MRN: [id number] Date of Birth: [birth year]  Today's Date: 10/08/2011 Time: [id number] Time Calculation (min): 34 min  Visit#: 7  of 16   Re-eval: 10/31/11 Therapeutic Exercise 106-140 34'    Subjective Symptoms/Limitations Symptoms: S:  I feel really good. Pain Assessment Currently in Pain?: No/denies      10/08/11 1316  Shoulder Exercises: Seated  Protraction Strengthening;15 reps  Protraction Weight (lbs) 3#  Horizontal ABduction Strengthening;15 reps  Horizontal ABduction Weight (lbs) 3#  External Rotation Strengthening;15 reps  External Rotation Weight (lbs) 3#  Internal Rotation Strengthening;15 reps  Internal Rotation Weight (lbs) 3#  Flexion Strengthening;15 reps  Flexion Weight (lbs) 3#  Abduction Strengthening;15 reps  ABduction Weight (lbs) 3#  Shoulder Exercises: ROM/Strengthening  UBE (Upper Arm Bike) 3' forward 3' backward 4.0  "W" Arms x10 with 3#  X to V Arms x10 with 3#           Occupational Therapy Assessment and Plan OT Assessment and Plan Clinical Impression Statement: P:  Added Cybex press and row Rehab Potential: Excellent OT Plan: P:  Continue to increase strength.   Goals Home Exercise Program Pt will Perform Home Exercise Program: Independently Short Term Goals Time to Complete Short Term Goals: 2 weeks Short Term Goal 1: Patient independent with HEP for Strenghtening and stretching. Short Term Goal 2: Patient will state 2/10 pain with movement and use. Short Term Goal 3: Pateint will be able to sleep through the night without pain. Short Term Goal 4: Patient will regain motion of the RUE shoulder equal to  range of LUE shoulder  Short Term Goal 5: Pateint will be able to perform shest hieght activity without pain. Long Term Goals Time to Complete Long Term Goals: 4 weeks Long Term Goal 1: 1/10 pain in both shoulders. Long Term Goal 2: 5/5 strength in RUE  shoulder complex. Long Term Goal 3: Pateint will be able to rach items above head without pain  Long Term Goal 4: Pateint will resume abilityt to reach seatbelt to put it on without pain or restrictions Long Term Goal 5: Pateint will resume light hoousehold acitivities   Problem List Patient Active Problem List  Diagnoses  . Pain in joint, shoulder region    End of Session Activity Tolerance: Patient tolerated treatment well General Behavior During Session: [facility] for tasks performed Cognition: [facility] for tasks performed  GO No functional reporting required   Mishika Flippen L. Thera Flake, COTA/L  10/08/2011, 6:07 PM

## 2011-10-10 ENCOUNTER — Ambulatory Visit (HOSPITAL_COMMUNITY)
Admission: RE | Admit: 2011-10-10 | Discharge: 2011-10-10 | Disposition: A | Discharge status: Home / Self Care | Payer: 59 | Source: Ambulatory Visit | Attending: Family Medicine | Admitting: Family Medicine

## 2011-10-10 ENCOUNTER — Telehealth (INDEPENDENT_AMBULATORY_CARE_PROVIDER_SITE_OTHER): Payer: Self-pay | Admitting: *Deleted

## 2011-10-10 DIAGNOSIS — M25519 Pain in unspecified shoulder: Secondary | ICD-10-CM

## 2011-10-10 NOTE — Telephone Encounter (Signed)
Requesting MD: Dr Elmarie Shiley (kidney doctor) PCP:  Quillian Quince     Name & DOB: Emmiliano Lounsberry 06/19/1951     Procedure: TCS  Reason/Indication:  Screening, pre-transplant evaluation  Has patient had this procedure before?  yes  If so, when, by whom and where?  More than 5 yrs ago  Is there a family history of colon cancer?  mo  Who?  What age when diagnosed?    Is patient diabetic?   no      Does patient have prosthetic heart valve?  no  Do you have a pacemaker?  no  Has patient had joint replacement within last 12 months?  no  Is patient on Coumadin, Plavix and/or Aspirin? no  Medications: diazepam 10 mg tid, carvedilol 25 mg bid, amlodipine 10 mg daily, simvastatin 40 mg daily, furosemide 40 mg daily, oxycodone 5 mg 2 tab qid, hydralazine 50 mg qid, flexeril 10 mg prn, dialyvit 800 + zinc daily, tylenol arthritis  Allergies: zaroxolyn  Pharmacy: [facility]  Medication Adjustment:   Procedure date & time: 10/26/11 at 730

## 2011-10-10 NOTE — Progress Notes (Addendum)
Occupational Therapy Treatment  Patient Details  Name: [name] MRN: [id number] Date of Birth: January 26, 1951  Today's Date: 10/10/2011 Time: [id number] Time Calculation (min): 37 min  Visit#: 8  of 16   Re-eval: 10/10/11 Therapeutic Exercise 100-137 37'    Subjective Symptoms/Limitations Symptoms: S:  I am doing good.  You guys showed me what to do when it hurts to make it stop hurting. Pain Assessment Currently in Pain?: No/denies  Precautions/Restrictions     Exercise/Treatments    Seated Protraction: Strengthening;15 reps (sitting on move-n-sit cushion for all seated strengthening) Protraction Weight (lbs): 3# Horizontal ABduction: Strengthening;15 reps Horizontal ABduction Weight (lbs): 3# External Rotation: Strengthening;15 reps External Rotation Weight (lbs): 3# Internal Rotation: Strengthening;15 reps Internal Rotation Weight (lbs): 3# Flexion: Strengthening;15 reps Flexion Weight (lbs): 3# Abduction: Strengthening;15 reps ABduction Weight (lbs): 3# Prone  Retraction: AROM;10 reps Flexion: AROM;10 reps Extension: AROM;10 reps External Rotation: AROM;10 reps Internal Rotation: AROM;10 reps Horizontal ABduction 1: AROM;10 reps Horizontal ABduction 2: AROM;10 reps Therapy Ball Flexion: 25 reps ABduction: 25 reps Right/Left: 5 reps ROM / Strengthening / Isometric Strengthening UBE (Upper Arm Bike): 3' forward 3' backward 4.0 Wall Wash: 3' with 1# Thumb Tacks: 1 "W" Arms: x10 with 3# (on move-n-sit) X to V Arms: x10 with 3# (on move-n-sit) Proximal Shoulder Strengthening, Seated: x20     Occupational Therapy Assessment and Plan OT Assessment and Plan Clinical Impression Statement: A:  See d/c note written by OTR/L on 10/03/2011 (patient feels he can carry on at home to increase strength and keep pain under control.) Rehab Potential: Excellent OT Plan: P:  d/c to HEP per plan on last progress note dated 10/03/2011.  Goals Home Exercise Program Pt  will Perform Home Exercise Program: Independently Short Term Goals Time to Complete Short Term Goals: 2 weeks Short Term Goal 1: Patient independent with HEP for Strenghtening and stretching. Short Term Goal 1 Progress: Met Short Term Goal 2: Patient will state 2/10 pain with movement and use. Short Term Goal 2 Progress: Met Short Term Goal 3: Pateint will be able to sleep through the night without pain. Short Term Goal 3 Progress: Met Short Term Goal 4: Patient will regain motion of the RUE shoulder equal to  range of LUE shoulder  Short Term Goal 4 Progress: Met Short Term Goal 5: Pateint will be able to perform shest hieght activity without pain. Short Term Goal 5 Progress: Met Long Term Goals Time to Complete Long Term Goals: 4 weeks Long Term Goal 1: 1/10 pain in both shoulders. Long Term Goal 1 Progress: Met Long Term Goal 2: 5/5 strength in RUE shoulder complex. Long Term Goal 2 Progress: Met Long Term Goal 3: Pateint will be able to rach items above head without pain  Long Term Goal 3 Progress: Met Long Term Goal 4: Pateint will resume abilityt to reach seatbelt to put it on without pain or restrictions Long Term Goal 4 Progress: Met Long Term Goal 5: Pateint will resume light hoousehold acitivities  Long Term Goal 5 Progress: Met  Problem List Patient Active Problem List  Diagnoses  . Pain in joint, shoulder region    End of Session Activity Tolerance: Patient tolerated treatment well General Behavior During Session: [facility] for tasks performed Cognition: [facility] for tasks performed  GO No functional reporting required   Rashunda Passon L. Thera Flake, COTA/L  10/10/2011, 3:18 PM

## 2011-10-10 NOTE — Telephone Encounter (Signed)
agree

## 2011-10-24 ENCOUNTER — Encounter (HOSPITAL_COMMUNITY): Payer: Self-pay

## 2011-10-25 MED ORDER — SODIUM CHLORIDE 0.45 % IV SOLN
Freq: Once | INTRAVENOUS | Status: AC
  Administered 2011-10-26: 1000 mL via INTRAVENOUS

## 2011-10-26 ENCOUNTER — Encounter (HOSPITAL_COMMUNITY)
Admission: RE | Disposition: A | Discharge status: Home / Self Care | Payer: Self-pay | Source: Ambulatory Visit | Attending: Internal Medicine

## 2011-10-26 ENCOUNTER — Ambulatory Visit (HOSPITAL_COMMUNITY)
Admission: RE | Admit: 2011-10-26 | Discharge: 2011-10-26 | Disposition: A | Discharge status: Home / Self Care | Payer: 59 | Source: Ambulatory Visit | Attending: Internal Medicine | Admitting: Internal Medicine

## 2011-10-26 ENCOUNTER — Encounter (HOSPITAL_COMMUNITY): Payer: Self-pay | Admitting: *Deleted

## 2011-10-26 DIAGNOSIS — Z860100 Personal history of colon polyps, unspecified: Secondary | ICD-10-CM

## 2011-10-26 DIAGNOSIS — K644 Residual hemorrhoidal skin tags: Secondary | ICD-10-CM

## 2011-10-26 DIAGNOSIS — Z01818 Encounter for other preprocedural examination: Secondary | ICD-10-CM

## 2011-10-26 DIAGNOSIS — K5730 Diverticulosis of large intestine without perforation or abscess without bleeding: Secondary | ICD-10-CM | POA: Insufficient documentation

## 2011-10-26 DIAGNOSIS — K648 Other hemorrhoids: Secondary | ICD-10-CM | POA: Insufficient documentation

## 2011-10-26 DIAGNOSIS — Z800 Family history of malignant neoplasm of digestive organs: Secondary | ICD-10-CM

## 2011-10-26 DIAGNOSIS — Z1211 Encounter for screening for malignant neoplasm of colon: Secondary | ICD-10-CM | POA: Insufficient documentation

## 2011-10-26 DIAGNOSIS — D126 Benign neoplasm of colon, unspecified: Secondary | ICD-10-CM

## 2011-10-26 HISTORY — PX: COLONOSCOPY: SHX5424

## 2011-10-26 SURGERY — COLONOSCOPY
Anesthesia: Moderate Sedation

## 2011-10-26 MED ORDER — MIDAZOLAM HCL 5 MG/5ML IJ SOLN
INTRAMUSCULAR | Status: AC
  Filled 2011-10-26: qty 5

## 2011-10-26 MED ORDER — MIDAZOLAM HCL 5 MG/5ML IJ SOLN
INTRAMUSCULAR | Status: AC
  Filled 2011-10-26: qty 10

## 2011-10-26 MED ORDER — MEPERIDINE HCL 50 MG/ML IJ SOLN
INTRAMUSCULAR | Status: DC | PRN
  Administered 2011-10-26 (×2): 25 mg via INTRAVENOUS

## 2011-10-26 MED ORDER — MEPERIDINE HCL 50 MG/ML IJ SOLN
INTRAMUSCULAR | Status: AC
  Filled 2011-10-26: qty 1

## 2011-10-26 MED ORDER — STERILE WATER FOR IRRIGATION IR SOLN
Status: DC | PRN
  Administered 2011-10-26: 08:00:00

## 2011-10-26 MED ORDER — MIDAZOLAM HCL 5 MG/5ML IJ SOLN
INTRAMUSCULAR | Status: DC | PRN
  Administered 2011-10-26: 2 mg via INTRAVENOUS
  Administered 2011-10-26: 1 mg via INTRAVENOUS
  Administered 2011-10-26 (×3): 2 mg via INTRAVENOUS
  Administered 2011-10-26 (×2): 3 mg via INTRAVENOUS

## 2011-10-26 NOTE — Op Note (Signed)
COLONOSCOPY PROCEDURE REPORT  PATIENT:  [name]  MR#:  [id number] Birthdate:  06/23/1951, 61 y.o., male Endoscopist:  Dr. Rogene Houston, MD Referred By:  Dr. Elmarie Shiley, MD Procedure Date: 10/26/2011  Procedure:   Colonoscopy with snare polypectomy.  Indications:  Patient is 60 year old Caucasian man who is undergoing pre-renal transplant screening colonoscopy.  Informed Consent:  The procedure and risks were reviewed with the patient and informed consent was obtained.  Medications:  Demerol 50 mg IV Versed 15 mg IV  Description of procedure:  After a digital rectal exam was performed, that colonoscope was advanced from the anus through the rectum and colon to the area of the cecum, ileocecal valve and appendiceal orifice. The cecum was deeply intubated. These structures were well-seen and photographed for the record. From the level of the cecum and ileocecal valve, the scope was slowly and cautiously withdrawn. The mucosal surfaces were carefully surveyed utilizing scope tip to flexion to facilitate fold flattening as needed. The scope was pulled down into the rectum where a thorough exam including retroflexion was performed.  Findings:   Prep fair to satisfactory. Few small output and less scattered throughout the colon. 3 mm polyp of aortic wall biopsy from cecum. 8 mm polyp snared from the ascending colon. 6 mm polyp snared from the hepatic flexure. Another 10 mm polyp snared from hepatic flexure. Small polyps could not be removed on today's exam. These are located at the hepatic flexure and sigmoid colon. Hemorrhoids above and below the dentate line.    Therapeutic/Diagnostic Maneuvers Performed:  See above  Complications:  None  Cecal Withdrawal Time:  34 minutes  Impression:  Examination phone to cecum. Pancolonic diverticulosis( mild). Small cecal polyp ablated via cold biopsy. Three polyps mid ranging in size from 6-10 mm. One was located at ascending colon  and the other 2 hepatic flexure. Two small polyps could not be removed. Internal/external hemorrhoids.  Recommendations:  No aspirin for 10 days. High fiber diet. I will be contacting patient biopsy results. Peak colonoscopy in 3 years.  Asberry Lascola U  10/26/2011 8:43 AM  CC: Dr. Gar Ponto, MD, MD & Dr. Rayne Du ref. provider found Dr. Elmarie Shiley, MD at [facility].

## 2011-10-26 NOTE — Discharge Instructions (Signed)
No aspirin for 10 days. Resume medications and high fiber diet. No driving for 24 hours. Physician will  Contact you with biopsy results. Next colonoscopy in 3 years  Colonoscopy Care After  These instructions give you information on caring for yourself after your procedure. Your doctor may also give you more specific instructions. Call your doctor if you have any problems or questions after your procedure. HOME CARE  Take it easy for the next 24 hours.   Rest.   Walk or use warm packs on your belly (abdomen) if you have belly cramping or gas.   Do not drive for 24 hours.   You may shower.   Do not sign important papers or use machinery for 24 hours.   Drink enough fluids to keep your pee (urine) clear or pale yellow.   Resume your normal diet. Avoid heavy or fried foods.   Avoid alcohol.   Continue taking your normal medicines.   Only take medicine as told by your doctor. Do not take aspirin.  If you had growths (polyps) removed:  Do not take aspirin.   Do not drink alcohol for 7 days or as told by your doctor.   Eat a soft diet for 24 hours.  GET HELP RIGHT AWAY IF:  You have a fever.   You pass clumps of tissue (blood clots) or fill the toilet with blood.   You have belly pain that gets worse and medicine does not help.   Your belly is puffy (swollen).   You feel sick to your stomach (nauseous) or throw up (vomit).  MAKE SURE YOU:  Understand these instructions.   Will watch your condition.   Will get help right away if you are not doing well or get worse.

## 2011-10-26 NOTE — H&P (Signed)
[name] is an 61 y.o. male.   Chief Complaint: Patient is in for a colonoscopy. HPI: Patient is 61 year old Caucasian male who is screening colonoscopy. He is being evaluated for renal transplant is positioned at [facility]  and his physicians recommended that he undergo  screening colonoscopy before he is placed on the waiting list. He denies abdominal pain change in his bowel habits or rectal bleeding. His last exam was possibly 10 years ago. Family history is negative for colorectal carcinoma.  History reviewed. No pertinent past medical history.  Past Surgical History  Procedure Date  . Inguinal hernia repair     ,  times   2  . Tonsillectomy   . Av fistula placement 2012       left arm     History reviewed. No pertinent family history. Social History:  reports that he has been smoking Cigars and Cigarettes.  He has smoked for the past 10 years. He does not have any smokeless tobacco history on file. He reports that he does not drink alcohol or use illicit drugs.  Allergies:  Allergies  Allergen Reactions  . Morphine And Related Other (See Comments)    Not effective    Medications Prior to Admission  Medication Dose Route Frequency Provider Last Rate Last Dose  . 0.45 % sodium chloride infusion   Intravenous Once Rogene Houston, MD 20 mL/hr at 10/26/11 0729    . meperidine (DEMEROL) 50 MG/ML injection           . midazolam (VERSED) 5 MG/5ML injection           . simethicone susp in sterile water 1000 mL irrigation    PRN Rogene Houston, MD       No current outpatient prescriptions on file as of 10/26/2011.    No results found for this or any previous visit (from the past 48 hour(s)). No results found.  Review of Systems  Constitutional: Negative for weight loss.  Gastrointestinal: Negative for abdominal pain, diarrhea, constipation, blood in stool and melena.    Blood pressure 134/73, pulse 62, temperature 97.6 F (36.4 C), temperature source Oral, resp. rate 10,  height 6\' 4"  (1.93 m), weight 209 lb (94.802 kg), SpO2 99.00%. Physical Exam  Constitutional: He appears well-developed and well-nourished.  HENT:  Mouth/Throat: Oropharynx is clear and moist.  Eyes: Conjunctivae are normal. No scleral icterus.  Neck: No thyromegaly present.  Cardiovascular: Normal rate, regular rhythm and normal heart sounds.   No murmur heard. Respiratory: Effort normal and breath sounds normal.  GI: He exhibits no distension and no mass. There is no tenderness.  Musculoskeletal: He exhibits no edema.  Lymphadenopathy:    He has no cervical adenopathy.  Skin: Skin is warm and dry.     Assessment/Plan Screening colonoscopy pre renal transplant.  Athleen Feltner U 10/26/2011, 7:37 AM

## 2011-10-30 ENCOUNTER — Encounter (HOSPITAL_COMMUNITY): Payer: Self-pay | Admitting: Internal Medicine

## 2011-11-14 ENCOUNTER — Encounter (INDEPENDENT_AMBULATORY_CARE_PROVIDER_SITE_OTHER): Payer: Self-pay | Admitting: *Deleted

## 2011-11-27 DIAGNOSIS — I10 Essential (primary) hypertension: Secondary | ICD-10-CM | POA: Insufficient documentation

## 2011-12-14 DIAGNOSIS — L0201 Cutaneous abscess of face: Secondary | ICD-10-CM | POA: Insufficient documentation

## 2012-02-04 ENCOUNTER — Encounter: Payer: Self-pay | Admitting: Neurosurgery

## 2012-02-04 ENCOUNTER — Telehealth: Payer: Self-pay | Admitting: Neurosurgery

## 2012-02-04 NOTE — Telephone Encounter (Signed)
Message copied by Gena Fray on Mon Feb 04, 2012  2:51 PM ------      Message from: Denman George      Created: Mon Feb 04, 2012  1:42 PM      Regarding: appt. w/ Rusty       ContactJA:[phone]       Pt. Called w/report of receiving deep scratches to skin from his dog near AVF site; questioned if he needs an antibiotic.  Still able to feel a thrill.  Per Dr. Trula Slade, schedule ov to check (L) AVF site.  Pt. can't come to office today.  Appt. Given for 10:20 AM 6/25; please put in computer.

## 2012-02-04 NOTE — Telephone Encounter (Signed)
Arbie Cookey notified patient of appt time, dpm

## 2012-02-05 ENCOUNTER — Ambulatory Visit (INDEPENDENT_AMBULATORY_CARE_PROVIDER_SITE_OTHER): Payer: 59 | Admitting: Neurosurgery

## 2012-02-05 ENCOUNTER — Encounter: Payer: Self-pay | Admitting: Neurosurgery

## 2012-02-05 VITALS — BP 128/73 | HR 62 | Resp 18 | Ht 76.0 in | Wt 213.7 lb

## 2012-02-05 DIAGNOSIS — T148XXA Other injury of unspecified body region, initial encounter: Secondary | ICD-10-CM | POA: Insufficient documentation

## 2012-02-05 DIAGNOSIS — N186 End stage renal disease: Secondary | ICD-10-CM | POA: Insufficient documentation

## 2012-02-05 NOTE — Progress Notes (Signed)
Subjective:     Patient ID: [name], male   DOB: 1951/03/17, 61 y.o.   MRN: [id number]  HPI: 61 year old male patient of Dr. [name] who had undergone Left radiocephalic AV fistula [id number]. The patient is not on dialysis yet but is diagnosed as end-stage renal disease. He was feeding his dog who obviously got excited and jumped on him scratching him on the left forearm. The patient states the wound actually looks better day by day, this happened 2 days ago. The patient denies any other vascular problems or any new medical diagnoses.    Review of Systems: 12 point review of systems is notable for the difficulties described above otherwise unremarkable     Objective:   Physical Exam: Well-developed well-nourished male in no apparent distress, afebrile, vital signs are stable. There is a palpable thrill in the left radiocephalic fistula with a superficial V-shaped scratch lateral to the fistula itself. There is minimal redness along scratch and no open wound or drainage.     Assessment:     Patient one year out from creation of left radiocephalic fistula. Superficial wound from a dog scratch lateral to the fistula that does not appear to have contacted the fistula in any way. The patient is not on hemodialysis and was just concerned for infection. I do not see signs of infection as the wound is no more red than the usual superficial scratch.    Plan:     Keflex 500 mg a twice a day x7 days followup when necessary  Beatris Ship ANP  Clinic M.D.: Kellie Simmering

## 2012-02-06 ENCOUNTER — Other Ambulatory Visit: Payer: Self-pay | Admitting: *Deleted

## 2012-02-06 DIAGNOSIS — L089 Local infection of the skin and subcutaneous tissue, unspecified: Secondary | ICD-10-CM

## 2012-02-06 MED ORDER — CEPHALEXIN 500 MG PO CAPS: 500.0000 mg | ORAL_CAPSULE | Freq: Two times a day (BID) | ORAL | Status: AC

## 2012-04-16 ENCOUNTER — Other Ambulatory Visit: Payer: Self-pay

## 2012-04-16 DIAGNOSIS — J449 Chronic obstructive pulmonary disease, unspecified: Secondary | ICD-10-CM

## 2012-04-17 ENCOUNTER — Other Ambulatory Visit: Payer: Self-pay

## 2012-04-17 DIAGNOSIS — J449 Chronic obstructive pulmonary disease, unspecified: Secondary | ICD-10-CM

## 2012-04-21 ENCOUNTER — Ambulatory Visit (HOSPITAL_COMMUNITY): Admission: RE | Admit: 2012-04-21 | Payer: 59 | Source: Ambulatory Visit | Attending: Cardiology | Admitting: Cardiology

## 2012-05-06 ENCOUNTER — Ambulatory Visit (HOSPITAL_COMMUNITY)
Admission: RE | Admit: 2012-05-06 | Discharge: 2012-05-06 | Disposition: A | Discharge status: Home / Self Care | Payer: 59 | Source: Ambulatory Visit | Attending: Family Medicine | Admitting: Family Medicine

## 2012-05-06 DIAGNOSIS — J449 Chronic obstructive pulmonary disease, unspecified: Secondary | ICD-10-CM | POA: Insufficient documentation

## 2012-05-06 DIAGNOSIS — R0609 Other forms of dyspnea: Secondary | ICD-10-CM | POA: Insufficient documentation

## 2012-05-06 DIAGNOSIS — R0989 Other specified symptoms and signs involving the circulatory and respiratory systems: Secondary | ICD-10-CM | POA: Insufficient documentation

## 2012-05-06 DIAGNOSIS — J4489 Other specified chronic obstructive pulmonary disease: Secondary | ICD-10-CM | POA: Insufficient documentation

## 2012-05-06 MED ORDER — ALBUTEROL SULFATE (5 MG/ML) 0.5% IN NEBU
2.5000 mg | INHALATION_SOLUTION | Freq: Once | RESPIRATORY_TRACT | Status: AC
  Administered 2012-05-06: 2.5 mg via RESPIRATORY_TRACT

## 2012-05-07 ENCOUNTER — Other Ambulatory Visit (HOSPITAL_COMMUNITY): Payer: Self-pay | Admitting: *Deleted

## 2012-05-08 ENCOUNTER — Encounter (HOSPITAL_COMMUNITY)
Admission: RE | Admit: 2012-05-08 | Discharge: 2012-05-08 | Disposition: A | Discharge status: Home / Self Care | Payer: 59 | Source: Ambulatory Visit | Attending: Nephrology | Admitting: Nephrology

## 2012-05-08 DIAGNOSIS — D638 Anemia in other chronic diseases classified elsewhere: Secondary | ICD-10-CM | POA: Insufficient documentation

## 2012-05-08 DIAGNOSIS — N186 End stage renal disease: Secondary | ICD-10-CM | POA: Insufficient documentation

## 2012-05-08 LAB — IRON AND TIBC
Saturation Ratios: 32 % (ref 20–55)
UIBC: 186 ug/dL (ref 125–400)

## 2012-05-08 LAB — FERRITIN: Ferritin: 36 ng/mL (ref 22–322)

## 2012-05-08 MED ORDER — EPOETIN ALFA 10000 UNIT/ML IJ SOLN
10000.0000 [IU] | INTRAMUSCULAR | Status: DC
  Administered 2012-05-08: 10000 [IU] via SUBCUTANEOUS

## 2012-05-08 MED ORDER — EPOETIN ALFA 10000 UNIT/ML IJ SOLN
INTRAMUSCULAR | Status: AC
  Administered 2012-05-08: 10000 [IU] via SUBCUTANEOUS
  Filled 2012-05-08: qty 1

## 2012-05-16 NOTE — Procedures (Signed)
[name], [name]               ACCOUNT NO.:  [id number]  MEDICAL RECORD NO.:  [id number]  LOCATION:                                 FACILITY:  PHYSICIAN:  Tyquisha Sharps L. Luan Pulling, M.D.DATE OF BIRTH:  05-19-1951  DATE OF PROCEDURE: DATE OF DISCHARGE:                           PULMONARY FUNCTION TEST   Reason for pulmonary function testing is COPD.  1. Spirometry shows no definite ventilatory defect, but does show     evidence of airflow obstruction. 2. Lung volumes are normal. 3. DLCO is severely reduced, but does correct somewhat when     ventilation is considered. 4. Airway resistance is high confirming the presence of airflow     obstruction. 5. This study is consistent with the clinical diagnosis of COPD.     [name] L. Luan Pulling, M.D.     ELH/MEDQ  D:  05/14/2012  T:  05/15/2012  Job:  [id number]  cc:   Gar Ponto, MD Fax: [phone]

## 2012-05-22 ENCOUNTER — Encounter (HOSPITAL_COMMUNITY)
Admission: RE | Admit: 2012-05-22 | Discharge: 2012-05-22 | Disposition: A | Discharge status: Home / Self Care | Payer: 59 | Source: Ambulatory Visit | Attending: Nephrology | Admitting: Nephrology

## 2012-05-22 DIAGNOSIS — N186 End stage renal disease: Secondary | ICD-10-CM | POA: Insufficient documentation

## 2012-05-22 DIAGNOSIS — D638 Anemia in other chronic diseases classified elsewhere: Secondary | ICD-10-CM | POA: Insufficient documentation

## 2012-05-22 LAB — POCT HEMOGLOBIN-HEMACUE: Hemoglobin: 10.5 g/dL — ABNORMAL LOW (ref 13.0–17.0)

## 2012-05-22 MED ORDER — EPOETIN ALFA 10000 UNIT/ML IJ SOLN
10000.0000 [IU] | INTRAMUSCULAR | Status: DC
  Administered 2012-05-22: 10000 [IU] via SUBCUTANEOUS

## 2012-05-22 MED ORDER — EPOETIN ALFA 10000 UNIT/ML IJ SOLN
INTRAMUSCULAR | Status: AC
  Administered 2012-05-22: 10000 [IU] via SUBCUTANEOUS
  Filled 2012-05-22: qty 1

## 2012-05-23 DIAGNOSIS — F17200 Nicotine dependence, unspecified, uncomplicated: Secondary | ICD-10-CM | POA: Insufficient documentation

## 2012-05-23 DIAGNOSIS — M1990 Unspecified osteoarthritis, unspecified site: Secondary | ICD-10-CM | POA: Insufficient documentation

## 2012-05-23 DIAGNOSIS — E782 Mixed hyperlipidemia: Secondary | ICD-10-CM | POA: Insufficient documentation

## 2012-05-27 LAB — PULMONARY FUNCTION TEST

## 2012-06-04 ENCOUNTER — Telehealth: Payer: Self-pay

## 2012-06-04 DIAGNOSIS — N186 End stage renal disease: Secondary | ICD-10-CM

## 2012-06-04 DIAGNOSIS — T82898A Other specified complication of vascular prosthetic devices, implants and grafts, initial encounter: Secondary | ICD-10-CM

## 2012-06-04 NOTE — Telephone Encounter (Signed)
Message copied by Denman George on Wed Jun 04, 2012  4:45 PM ------      Message from: Lujean Amel      Created: Wed Jun 04, 2012  1:51 PM      Regarding: triage       Please call Abigail Butts at [phone] [facility]. Dr.Webb wants him seen at our office on Friday afternoon because the pt is having difficulty cannulating. I wasn't sure if we need to see him here and if he will need any vascular studies prior.       Thanks, Anne Ng

## 2012-06-04 NOTE — Telephone Encounter (Signed)
Rec'd call from [facility] @ [facility].  States the dialysis center is having trouble cannulating pt's access (L) arm.  States 3 out of 4 sticks today infiltrated.  States Dr. Justin Mend wants pt. Seen on Fri., 06/06/12 after HD.  Discussed w/ Dr. Scot Dock.  Rec'd. Verbal order for obtaining duplex of left arm AVF prior to office appt.

## 2012-06-05 ENCOUNTER — Encounter: Payer: Self-pay | Admitting: Vascular Surgery

## 2012-06-05 ENCOUNTER — Encounter (HOSPITAL_COMMUNITY): Payer: 59

## 2012-06-05 NOTE — Telephone Encounter (Signed)
I scheduled an appt for this pt for Friday 06/06/12 at 1pm for lab study and to see [place] at 1:30pm per Carol's instructions. I notified Abigail Butts at [facility] and she will notify pt of this appt/awt

## 2012-06-06 ENCOUNTER — Ambulatory Visit (INDEPENDENT_AMBULATORY_CARE_PROVIDER_SITE_OTHER): Payer: 59 | Admitting: Neurosurgery

## 2012-06-06 ENCOUNTER — Encounter: Payer: Self-pay | Admitting: Neurosurgery

## 2012-06-06 ENCOUNTER — Encounter (INDEPENDENT_AMBULATORY_CARE_PROVIDER_SITE_OTHER): Payer: 59 | Admitting: *Deleted

## 2012-06-06 VITALS — BP 146/74 | HR 57 | Resp 16 | Ht 76.0 in | Wt 209.0 lb

## 2012-06-06 DIAGNOSIS — T82898A Other specified complication of vascular prosthetic devices, implants and grafts, initial encounter: Secondary | ICD-10-CM

## 2012-06-06 DIAGNOSIS — Z48812 Encounter for surgical aftercare following surgery on the circulatory system: Secondary | ICD-10-CM

## 2012-06-06 DIAGNOSIS — N186 End stage renal disease: Secondary | ICD-10-CM

## 2012-06-06 NOTE — Progress Notes (Signed)
Subjective:     Patient ID: [name], male   DOB: 1950/11/14, 61 y.o.   MRN: [id number]  HPI: 61 year old male patient with his last intervention being a left radiocephalic fistula with Dr. [name] in 2012. The patient called the office due to to a "difficult cannulation" and asked to be evaluated. We did bring the patient in for a left arm fistula duplex. The patient states there is only been 3 attempts with this fistula 2 which were successful one of those being today. The patient states that the dialysis staff did tell him since he has had 2 successful cannulations the fistula may become better with time and he is hopeful that will work.   Review of Systems: 12 point review of systems is notable for the difficulties described above otherwise unremarkable     Objective:   Physical Exam: Afebrile, vital signs are stable, there is no redness or drainage anywhere around the fistula site, the patient had successful cannulation today two out of three.     Assessment:     Duplex of the left arm fistula does show some elevations ranging from 580-719, this was reviewed with Dr. Bridgett Larsson who states that if the patient is having successful cannulation he should continue to try to use this fistula, however at some point he may need a fistulogram and possible other intervention.    Plan:     The patient was given a copy of his duplex to take to dialysis for a guide. The patient will followup here on when necessary basis, his questions were encouraged and answered he is in agreement with this plan.  Beatris Ship ANP  Clinic M.D.: Bridgett Larsson

## 2012-12-02 ENCOUNTER — Other Ambulatory Visit: Payer: Self-pay | Admitting: *Deleted

## 2012-12-04 ENCOUNTER — Encounter (HOSPITAL_COMMUNITY): Payer: Self-pay | Admitting: Pharmacy Technician

## 2012-12-09 ENCOUNTER — Encounter (HOSPITAL_COMMUNITY)
Admission: RE | Disposition: A | Discharge status: Home / Self Care | Payer: Self-pay | Source: Ambulatory Visit | Attending: Surgery

## 2012-12-09 ENCOUNTER — Ambulatory Visit (HOSPITAL_COMMUNITY)
Admission: RE | Admit: 2012-12-09 | Discharge: 2012-12-09 | Disposition: A | Discharge status: Home / Self Care | Payer: 59 | Source: Ambulatory Visit | Attending: Surgery | Admitting: Surgery

## 2012-12-09 DIAGNOSIS — J4489 Other specified chronic obstructive pulmonary disease: Secondary | ICD-10-CM | POA: Insufficient documentation

## 2012-12-09 DIAGNOSIS — I871 Compression of vein: Secondary | ICD-10-CM | POA: Insufficient documentation

## 2012-12-09 DIAGNOSIS — T82898A Other specified complication of vascular prosthetic devices, implants and grafts, initial encounter: Secondary | ICD-10-CM

## 2012-12-09 DIAGNOSIS — Z79899 Other long term (current) drug therapy: Secondary | ICD-10-CM | POA: Insufficient documentation

## 2012-12-09 DIAGNOSIS — J449 Chronic obstructive pulmonary disease, unspecified: Secondary | ICD-10-CM | POA: Insufficient documentation

## 2012-12-09 DIAGNOSIS — E119 Type 2 diabetes mellitus without complications: Secondary | ICD-10-CM | POA: Insufficient documentation

## 2012-12-09 DIAGNOSIS — Y832 Surgical operation with anastomosis, bypass or graft as the cause of abnormal reaction of the patient, or of later complication, without mention of misadventure at the time of the procedure: Secondary | ICD-10-CM | POA: Insufficient documentation

## 2012-12-09 DIAGNOSIS — Z885 Allergy status to narcotic agent status: Secondary | ICD-10-CM | POA: Insufficient documentation

## 2012-12-09 DIAGNOSIS — N186 End stage renal disease: Secondary | ICD-10-CM | POA: Insufficient documentation

## 2012-12-09 DIAGNOSIS — F17200 Nicotine dependence, unspecified, uncomplicated: Secondary | ICD-10-CM | POA: Insufficient documentation

## 2012-12-09 DIAGNOSIS — D649 Anemia, unspecified: Secondary | ICD-10-CM | POA: Insufficient documentation

## 2012-12-09 HISTORY — PX: EMBOLECTOMY: SHX44

## 2012-12-09 HISTORY — PX: SHUNTOGRAM: SHX5491

## 2012-12-09 LAB — POCT I-STAT, CHEM 8
Calcium, Ion: 1.13 mmol/L (ref 1.13–1.30)
Glucose, Bld: 136 mg/dL — ABNORMAL HIGH (ref 70–99)
HCT: 36 % — ABNORMAL LOW (ref 39.0–52.0)
Hemoglobin: 12.2 g/dL — ABNORMAL LOW (ref 13.0–17.0)

## 2012-12-09 SURGERY — ASSESSMENT, SHUNT FUNCTION, WITH CONTRAST RADIOGRAPHIC STUDY
Anesthesia: LOCAL

## 2012-12-09 MED ORDER — FENTANYL CITRATE 0.05 MG/ML IJ SOLN
INTRAMUSCULAR | Status: AC
  Filled 2012-12-09: qty 2

## 2012-12-09 MED ORDER — LIDOCAINE HCL (PF) 1 % IJ SOLN
INTRAMUSCULAR | Status: AC
  Filled 2012-12-09: qty 30

## 2012-12-09 MED ORDER — FENTANYL CITRATE 0.05 MG/ML IJ SOLN
25.0000 ug | Freq: Once | INTRAMUSCULAR | Status: AC
  Administered 2012-12-09: 25 ug via INTRAVENOUS

## 2012-12-09 NOTE — Op Note (Signed)
[facility]  Patient name: [name] MRN: [id number] DOB: 07/13/1951 Sex: male  12/09/2012 Pre-operative Diagnosis: Poorly functioning left radiocephalic wrist Post-operative diagnosis:  Same Surgeon:  Eldridge Abrahams Procedure Performed:  1.  ultrasound-guided access, left arm fistula  2.  fistulogram  3.  coil embolization, left cephalic vein branch  4.  angioplasty, left cephalic vein  5.  followup angiographyx 2     Indications:  The patient has had a left radiocephalic fistula placed. He has had trouble with dialysis cannulation and flow rates. He comes in for further evaluation  Procedure:  The patient was identified in the holding area and taken to room 8.  The patient was then placed supine on the table and prepped and draped in the usual sterile fashion.  A time out was called.  Ultrasound was used to evaluate the fistula.  The vein was patent and compressible.  A digital ultrasound image was acquired.  The fistula was then accessed under ultrasound guidance using a micropuncture needle.  An 018 wire was then asvanced without resistance and a micropuncture sheath was placed.  Contrast injections were then performed through the sheath. I initially tried to access the fistula and at the level of the antecubital crease, and going down towards the wrist. An I had difficulty advancing the wire on 2 separate occasions. I therefore elected to re\re access the fistula at the wrist.  Findings:  The arterial venous anastomosis is widely patent. The cephalic vein in the distal forearm is patent measuring approximately 6 mm. There is a large branch in the mid forearm. Just distal to the antecubital crease there is a major branch to the deep system. There is also a stenosis at this area. The remaining portion of the cephalic vein is widely patent and there is no evidence of central venous stenosis   Intervention:  After the above images were obtained, the  decision was made to proceed with intervention. I initially over a 035 wire placed a 6 French sheath. The patient was not given any heparin. I used a Kumpe catheter to select the large cephalic vein branch. I then proceeded with coil embolization using 28 mm and one 6 mm nester coils. Followup imaging revealed resolution of blood flow through them branch. I then elected to perform angioplasty of the cephalic vein stenosis. Did this with a 7 x 40 Mustang balloon initially. This was taken to 10 atmospheres and held for 1 minute. Followup angiogram was suboptimal and therefore I reinserted the balloon and inflated it to 20 atmospheres. Followup angiography revealed adequate flow through this area with preservation of the branch.  At this point time there was a markedly improved thrill within the fistula. I elected not to proceed with embolization of the branch to the deep system. Catheters and wires were removed. The sheath was removed and a suture placed for hemostasis. The patient did have a mild hematoma which required manual pressure for 5-10 minutes.  Impression:  #1  widely patent arterial venous anastomosis  #2  no evidence of central venous stenosis  #3  2 large branches on the fistula. The one in the mid forearm was successfully embolized using a combination of 8 and 6 mm nester coils. There is a larger branch to the deep venous system that was not addressed today. Should the patient continued to have problems with dialysis, I would consider addressing that branch.  #4  successful angioplasty of a cephalic vein stenosis  just distal to the antecubital crease   V. Annamarie Major, M.D. [facility] Office: [phone] Pager:  [phone]

## 2012-12-09 NOTE — H&P (Signed)
[facility]   Patient name: [name] MRN: [id number] DOB: [birth year] Sex: male    No chief complaint on file.   HISTORY OF PRESENT ILLNESS: The patient has a left radiocephalic fistula that is difficult to cannulate. He comes in today for further evaluation. He had a duplex in the office which showed a possible arterial venous anastomotic stenosis as well as several areas within the fistula that were stenoti  Past Medical History  Diagnosis Date  . Anemia   . COPD (chronic obstructive pulmonary disease)   . Diabetes mellitus without complication   . Chronic kidney disease     Past Surgical History  Procedure Laterality Date  . Inguinal hernia repair      ,  times   2  . Tonsillectomy    . Av fistula placement  2012       left arm   . Colonoscopy  10/26/2011    Procedure: COLONOSCOPY;  Surgeon: Rogene Houston, MD;  Location: [facility];  Service: Endoscopy;  Laterality: N/A;  730  . Knee arthroscopy  2011    Right Knee    History   Social History  . Marital Status: Married    Spouse Name: N/A    Number of Children: N/A  . Years of Education: N/A   Occupational History  . Not on file.   Social History Main Topics  . Smoking status: Current Every Day Smoker -- 10 years    Types: Cigars, Cigarettes  . Smokeless tobacco: Not on file  . Alcohol Use: No  . Drug Use: No  . Sexually Active:    Other Topics Concern  . Not on file   Social History Narrative  . No narrative on file    Family History  Problem Relation Age of Onset  . Heart disease Father     Heart Disease before age 63    Allergies as of 12/02/2012 - Review Complete 06/06/2012  Allergen Reaction Noted  . Morphine and related Other (See Comments) 10/24/2011    No current facility-administered medications on file prior to encounter.   Current Outpatient Prescriptions on File Prior to Encounter  Medication Sig Dispense Refill  . acetaminophen (TYLENOL)  650 MG CR tablet Take 650 mg by mouth 4 (four) times daily.      . B Complex-C-Zn-Folic Acid (DIALYVITE/ZINC PO) Take 1 tablet by mouth daily.      . diazepam (VALIUM) 10 MG tablet Take 10 mg by mouth 3 (three) times daily.      Marland Kitchen oxyCODONE (OXY IR/ROXICODONE) 5 MG immediate release tablet Take 10 mg by mouth 4 (four) times daily.         REVIEW OF SYSTEMS: Cardiovascular: No chest pain,  Pulmonary: No productive cough Neurologic: No weakness, paresthesias, aphasia, or amaurosis. No dizziness. Constitutional: No fever or chills.  PHYSICAL EXAMINATION:   Vital signs are BP 137/85  Pulse 76  Temp(Src) 97 F (36.1 C) (Oral)  Resp 18  Ht 6\' 4"  (1.93 m)  Wt 220 lb (99.791 kg)  BMI 26.79 kg/m2  SpO2 100% General: The patient appears their stated age. HEENT:  No gross abnormalities Pulmonary:  Non labored breathing Musculoskeletal: There are no major deformities. Neurologic: No focal weakness or paresthesias are detected, Skin: There are no ulcer or rashes noted. Psychiatric: The patient has normal affect. Cardiovascular: There is a regular rate and rhythm without significant murmur appreciated.   Assessment:  end-stage renal disease  Plan:  fistulogram. Intervention will be based on the images   V. Leia Alf, M.D. [facility] Office: [phone] Pager:  [phone]

## 2012-12-10 ENCOUNTER — Telehealth: Payer: Self-pay

## 2012-12-10 ENCOUNTER — Encounter (HOSPITAL_COMMUNITY): Payer: Self-pay | Admitting: *Deleted

## 2012-12-10 ENCOUNTER — Other Ambulatory Visit: Payer: Self-pay

## 2012-12-10 MED ORDER — DEXTROSE 5 % IV SOLN
1.5000 g | INTRAVENOUS | Status: DC
  Filled 2012-12-10: qty 1.5

## 2012-12-10 NOTE — Telephone Encounter (Signed)
Nurse from [facility] called to report pt. Has a clotted left arm AVF; stated unable to cannulate, pulling clots out, and absence of bruit or thrill.  Pt. Last ate solid food at 8:00 AM, and has been eating ice chips while at dialysis; approx. 1/2 glass.  Called Dr. Kellie Simmering.  Advised if pt. Not in fluid overload, to schedule pt. For IDC, and possible Thrombectomy of left arm AVF per Dr. Trula Slade, tomorrow.  If pt. Is in fluid overload, and needs dialyzing prior to tomorrow, then will plan to place dialysis catheter today.  Called nurse, Jolayne Haines, at [facility].  Advised of recommendation per Dr. Kellie Simmering.  Stated pt. Is not in fluid overload, and she felt he could wait for surgery in the morning.  Advised to have pt. arrive at 8:30 AM to Short Stay @ [facility]. / NPO after midnight.  Jolayne Haines will give instructions to pt.

## 2012-12-10 NOTE — Progress Notes (Signed)
Pt denies SOB, chest pain, and being under the care of a cardiologist. Pt denies current treatment for hypertension and diabetes.

## 2012-12-11 ENCOUNTER — Ambulatory Visit (HOSPITAL_COMMUNITY): Payer: 59

## 2012-12-11 ENCOUNTER — Encounter (HOSPITAL_COMMUNITY): Payer: Self-pay | Admitting: Certified Registered"

## 2012-12-11 ENCOUNTER — Encounter (HOSPITAL_COMMUNITY)
Admission: RE | Disposition: A | Discharge status: Home / Self Care | Payer: Self-pay | Source: Ambulatory Visit | Attending: Surgery

## 2012-12-11 ENCOUNTER — Ambulatory Visit (HOSPITAL_COMMUNITY)
Admission: RE | Admit: 2012-12-11 | Discharge: 2012-12-11 | Disposition: A | Discharge status: Home / Self Care | Payer: 59 | Source: Ambulatory Visit | Attending: Surgery | Admitting: Surgery

## 2012-12-11 ENCOUNTER — Ambulatory Visit (HOSPITAL_COMMUNITY): Payer: 59 | Admitting: Certified Registered"

## 2012-12-11 ENCOUNTER — Encounter (HOSPITAL_COMMUNITY): Payer: Self-pay | Admitting: *Deleted

## 2012-12-11 DIAGNOSIS — F3289 Other specified depressive episodes: Secondary | ICD-10-CM | POA: Insufficient documentation

## 2012-12-11 DIAGNOSIS — K219 Gastro-esophageal reflux disease without esophagitis: Secondary | ICD-10-CM | POA: Insufficient documentation

## 2012-12-11 DIAGNOSIS — F411 Generalized anxiety disorder: Secondary | ICD-10-CM | POA: Insufficient documentation

## 2012-12-11 DIAGNOSIS — T82898A Other specified complication of vascular prosthetic devices, implants and grafts, initial encounter: Secondary | ICD-10-CM | POA: Insufficient documentation

## 2012-12-11 DIAGNOSIS — N186 End stage renal disease: Secondary | ICD-10-CM | POA: Insufficient documentation

## 2012-12-11 DIAGNOSIS — Z87891 Personal history of nicotine dependence: Secondary | ICD-10-CM | POA: Insufficient documentation

## 2012-12-11 DIAGNOSIS — Y832 Surgical operation with anastomosis, bypass or graft as the cause of abnormal reaction of the patient, or of later complication, without mention of misadventure at the time of the procedure: Secondary | ICD-10-CM | POA: Insufficient documentation

## 2012-12-11 DIAGNOSIS — E119 Type 2 diabetes mellitus without complications: Secondary | ICD-10-CM | POA: Insufficient documentation

## 2012-12-11 DIAGNOSIS — J4489 Other specified chronic obstructive pulmonary disease: Secondary | ICD-10-CM | POA: Insufficient documentation

## 2012-12-11 DIAGNOSIS — I742 Embolism and thrombosis of arteries of the upper extremities: Secondary | ICD-10-CM | POA: Insufficient documentation

## 2012-12-11 DIAGNOSIS — Z992 Dependence on renal dialysis: Secondary | ICD-10-CM | POA: Insufficient documentation

## 2012-12-11 DIAGNOSIS — I120 Hypertensive chronic kidney disease with stage 5 chronic kidney disease or end stage renal disease: Secondary | ICD-10-CM | POA: Insufficient documentation

## 2012-12-11 DIAGNOSIS — F329 Major depressive disorder, single episode, unspecified: Secondary | ICD-10-CM | POA: Insufficient documentation

## 2012-12-11 DIAGNOSIS — J449 Chronic obstructive pulmonary disease, unspecified: Secondary | ICD-10-CM | POA: Insufficient documentation

## 2012-12-11 HISTORY — PX: THROMBECTOMY W/ EMBOLECTOMY: SHX2507

## 2012-12-11 HISTORY — DX: Essential (primary) hypertension: I10

## 2012-12-11 HISTORY — DX: Personal history of other diseases of the digestive system: Z87.19

## 2012-12-11 HISTORY — DX: Depression, unspecified: F32.A

## 2012-12-11 HISTORY — DX: Anesthesia of skin: R20.0

## 2012-12-11 HISTORY — DX: Anxiety disorder, unspecified: F41.9

## 2012-12-11 HISTORY — DX: Major depressive disorder, single episode, unspecified: F32.9

## 2012-12-11 HISTORY — DX: Pneumonia, unspecified organism: J18.9

## 2012-12-11 HISTORY — DX: Headache: R51

## 2012-12-11 HISTORY — DX: Unspecified osteoarthritis, unspecified site: M19.90

## 2012-12-11 HISTORY — DX: Gastro-esophageal reflux disease without esophagitis: K21.9

## 2012-12-11 SURGERY — THROMBECTOMY ARTERIOVENOUS FISTULA
Anesthesia: General | Site: Neck | Wound class: Clean

## 2012-12-11 MED ORDER — HEPARIN SODIUM (PORCINE) 1000 UNIT/ML IJ SOLN
INTRAMUSCULAR | Status: DC | PRN
  Administered 2012-12-11: 5000 [IU] via INTRAVENOUS

## 2012-12-11 MED ORDER — LIDOCAINE HCL (CARDIAC) 20 MG/ML IV SOLN
INTRAVENOUS | Status: DC | PRN
  Administered 2012-12-11: 75 mg via INTRAVENOUS

## 2012-12-11 MED ORDER — OXYCODONE HCL 5 MG PO TABS: 10.0000 mg | ORAL_TABLET | Freq: Four times a day (QID) | ORAL | Status: DC

## 2012-12-11 MED ORDER — PROPOFOL 10 MG/ML IV BOLUS
INTRAVENOUS | Status: DC | PRN
  Administered 2012-12-11: 200 mg via INTRAVENOUS

## 2012-12-11 MED ORDER — SODIUM CHLORIDE 0.9 % IR SOLN
Status: DC | PRN
  Administered 2012-12-11: 1000 mL

## 2012-12-11 MED ORDER — HYDROMORPHONE HCL PF 1 MG/ML IJ SOLN
INTRAMUSCULAR | Status: AC
  Administered 2012-12-11: 0.5 mg via INTRAVENOUS
  Filled 2012-12-11: qty 1

## 2012-12-11 MED ORDER — MUPIROCIN 2 % EX OINT
TOPICAL_OINTMENT | CUTANEOUS | Status: AC
  Administered 2012-12-11: 1 via NASAL
  Filled 2012-12-11: qty 22

## 2012-12-11 MED ORDER — DEXTROSE 5 % IV SOLN
1.5000 g | INTRAVENOUS | Status: DC | PRN
  Administered 2012-12-11: 1.5 g via INTRAVENOUS

## 2012-12-11 MED ORDER — MUPIROCIN 2 % EX OINT: TOPICAL_OINTMENT | Freq: Two times a day (BID) | CUTANEOUS | Status: DC

## 2012-12-11 MED ORDER — CEPHALEXIN 250 MG PO CAPS: 250.0000 mg | ORAL_CAPSULE | Freq: Two times a day (BID) | ORAL | Status: DC

## 2012-12-11 MED ORDER — HYDROMORPHONE HCL PF 1 MG/ML IJ SOLN
0.2500 mg | INTRAMUSCULAR | Status: DC | PRN
  Administered 2012-12-11: 0.5 mg via INTRAVENOUS

## 2012-12-11 MED ORDER — ONDANSETRON HCL 4 MG/2ML IJ SOLN: 4.0000 mg | Freq: Once | INTRAMUSCULAR | Status: DC | PRN

## 2012-12-11 MED ORDER — HEPARIN SODIUM (PORCINE) 1000 UNIT/ML IJ SOLN
INTRAMUSCULAR | Status: AC
  Filled 2012-12-11: qty 1

## 2012-12-11 MED ORDER — FENTANYL CITRATE 0.05 MG/ML IJ SOLN
INTRAMUSCULAR | Status: DC | PRN
  Administered 2012-12-11: 50 ug via INTRAVENOUS
  Administered 2012-12-11: 100 ug via INTRAVENOUS
  Administered 2012-12-11 (×2): 50 ug via INTRAVENOUS

## 2012-12-11 MED ORDER — SODIUM CHLORIDE 0.9 % IR SOLN
Status: DC | PRN
  Administered 2012-12-11: 16:00:00

## 2012-12-11 MED ORDER — SODIUM CHLORIDE 0.9 % IV SOLN
INTRAVENOUS | Status: DC
  Administered 2012-12-11 (×3): via INTRAVENOUS

## 2012-12-11 MED ORDER — LIDOCAINE-EPINEPHRINE (PF) 1 %-1:200000 IJ SOLN
INTRAMUSCULAR | Status: AC
  Filled 2012-12-11: qty 10

## 2012-12-11 SURGICAL SUPPLY — 70 items
BAG DECANTER FOR FLEXI CONT (MISCELLANEOUS) IMPLANT
CANISTER SUCTION 2500CC (MISCELLANEOUS) ×3 IMPLANT
CATH CANNON HEMO 15F 50CM (CATHETERS) IMPLANT
CATH CANNON HEMO 15FR 19 (HEMODIALYSIS SUPPLIES) IMPLANT
CATH CANNON HEMO 15FR 23CM (HEMODIALYSIS SUPPLIES) IMPLANT
CATH CANNON HEMO 15FR 31CM (HEMODIALYSIS SUPPLIES) IMPLANT
CATH CANNON HEMO 15FR 32CM (HEMODIALYSIS SUPPLIES) IMPLANT
CATH EMB 3FR 40CM (CATHETERS) ×3 IMPLANT
CATH EMB 3FR 80CM (CATHETERS) ×3 IMPLANT
CATH EMB 4FR 40CM (CATHETERS) ×3 IMPLANT
CATH EMB 4FR 80CM (CATHETERS) ×3 IMPLANT
CLIP TI MEDIUM 6 (CLIP) ×3 IMPLANT
CLIP TI WIDE RED SMALL 6 (CLIP) ×3 IMPLANT
CLOTH BEACON ORANGE TIMEOUT ST (SAFETY) ×3 IMPLANT
COVER PROBE W GEL 5X96 (DRAPES) ×3 IMPLANT
COVER SURGICAL LIGHT HANDLE (MISCELLANEOUS) ×9 IMPLANT
DERMABOND ADVANCED (GAUZE/BANDAGES/DRESSINGS) ×1
DERMABOND ADVANCED .7 DNX12 (GAUZE/BANDAGES/DRESSINGS) ×2 IMPLANT
DRAPE C-ARM 42X72 X-RAY (DRAPES) IMPLANT
DRAPE CHEST BREAST 15X10 FENES (DRAPES) IMPLANT
DRAPE X-RAY CASS 24X20 (DRAPES) IMPLANT
ELECT REM PT RETURN 9FT ADLT (ELECTROSURGICAL) ×3
ELECTRODE REM PT RTRN 9FT ADLT (ELECTROSURGICAL) ×2 IMPLANT
GAUZE SPONGE 2X2 8PLY STRL LF (GAUZE/BANDAGES/DRESSINGS) IMPLANT
GAUZE SPONGE 4X4 16PLY XRAY LF (GAUZE/BANDAGES/DRESSINGS) IMPLANT
GEL ULTRASOUND 20GR AQUASONIC (MISCELLANEOUS) ×3 IMPLANT
GLOVE BIOGEL PI IND STRL 6.5 (GLOVE) ×2 IMPLANT
GLOVE BIOGEL PI IND STRL 7.0 (GLOVE) ×2 IMPLANT
GLOVE BIOGEL PI IND STRL 7.5 (GLOVE) ×2 IMPLANT
GLOVE BIOGEL PI INDICATOR 6.5 (GLOVE) ×1
GLOVE BIOGEL PI INDICATOR 7.0 (GLOVE) ×1
GLOVE BIOGEL PI INDICATOR 7.5 (GLOVE) ×1
GLOVE SS BIOGEL STRL SZ 6.5 (GLOVE) ×2 IMPLANT
GLOVE SUPERSENSE BIOGEL SZ 6.5 (GLOVE) ×1
GLOVE SURG SS PI 7.0 STRL IVOR (GLOVE) ×3 IMPLANT
GLOVE SURG SS PI 7.5 STRL IVOR (GLOVE) ×3 IMPLANT
GOWN PREVENTION PLUS XXLARGE (GOWN DISPOSABLE) ×3 IMPLANT
GOWN STRL NON-REIN LRG LVL3 (GOWN DISPOSABLE) ×6 IMPLANT
HEMOSTAT SNOW SURGICEL 2X4 (HEMOSTASIS) IMPLANT
HEMOSTAT SURGICEL 2X14 (HEMOSTASIS) IMPLANT
KIT BASIN OR (CUSTOM PROCEDURE TRAY) ×3 IMPLANT
KIT ROOM TURNOVER OR (KITS) ×3 IMPLANT
LOOP VESSEL MINI RED (MISCELLANEOUS) ×3 IMPLANT
NEEDLE 18GX1X1/2 (RX/OR ONLY) (NEEDLE) IMPLANT
NEEDLE HYPO 25GX1X1/2 BEV (NEEDLE) ×3 IMPLANT
NS IRRIG 1000ML POUR BTL (IV SOLUTION) ×3 IMPLANT
PACK CV ACCESS (CUSTOM PROCEDURE TRAY) ×3 IMPLANT
PACK SURGICAL SETUP 50X90 (CUSTOM PROCEDURE TRAY) IMPLANT
PAD ARMBOARD 7.5X6 YLW CONV (MISCELLANEOUS) ×6 IMPLANT
PATCH VASCULAR VASCU GUARD 1X6 (Vascular Products) ×3 IMPLANT
SET COLLECT BLD 21X3/4 12 (NEEDLE) IMPLANT
SOAP 2 % CHG 4 OZ (WOUND CARE) IMPLANT
SPONGE GAUZE 2X2 STER 10/PKG (GAUZE/BANDAGES/DRESSINGS)
STOPCOCK 4 WAY LG BORE MALE ST (IV SETS) IMPLANT
SUT ETHILON 3 0 PS 1 (SUTURE) IMPLANT
SUT PROLENE 6 0 BV (SUTURE) ×9 IMPLANT
SUT VIC AB 3-0 SH 27 (SUTURE) ×2
SUT VIC AB 3-0 SH 27X BRD (SUTURE) ×4 IMPLANT
SUT VICRYL 4-0 PS2 18IN ABS (SUTURE) ×3 IMPLANT
SYR 20CC LL (SYRINGE) ×3 IMPLANT
SYR 30ML LL (SYRINGE) IMPLANT
SYR 5ML LL (SYRINGE) IMPLANT
SYR CONTROL 10ML LL (SYRINGE) IMPLANT
SYR TB 1ML LUER SLIP (SYRINGE) ×3 IMPLANT
SYRINGE 10CC LL (SYRINGE) IMPLANT
TOWEL OR 17X24 6PK STRL BLUE (TOWEL DISPOSABLE) ×3 IMPLANT
TOWEL OR 17X26 10 PK STRL BLUE (TOWEL DISPOSABLE) ×3 IMPLANT
TUBING EXTENTION W/L.L. (IV SETS) IMPLANT
UNDERPAD 30X30 INCONTINENT (UNDERPADS AND DIAPERS) ×3 IMPLANT
WATER STERILE IRR 1000ML POUR (IV SOLUTION) ×3 IMPLANT

## 2012-12-11 NOTE — Preoperative (Signed)
Beta Blockers   Reason not to administer Beta Blockers:Not Applicable 

## 2012-12-11 NOTE — Progress Notes (Signed)
Pt states that a stress test was performed at [facility].

## 2012-12-11 NOTE — Anesthesia Procedure Notes (Signed)
Procedure Name: LMA Insertion Date/Time: 12/11/2012 3:59 PM Performed by: Eligha Bridegroom Pre-anesthesia Checklist: Patient identified, Timeout performed, Emergency Drugs available, Suction available and Patient being monitored Patient Re-evaluated:Patient Re-evaluated prior to inductionOxygen Delivery Method: Circle system utilized Preoxygenation: Pre-oxygenation with 100% oxygen Intubation Type: IV induction LMA: LMA inserted LMA Size: 5.0 Placement Confirmation: positive ETCO2 Tube secured with: Tape Dental Injury: Teeth and Oropharynx as per pre-operative assessment

## 2012-12-11 NOTE — Transfer of Care (Signed)
Immediate Anesthesia Transfer of Care Note  Patient: [name]  Procedure(s) Performed: Procedure(s): THROMBECTOMY ARTERIOVENOUS FISTULA (Left)  Patient Location: PACU  Anesthesia Type:General  Level of Consciousness: awake and alert   Airway & Oxygen Therapy: Patient Spontanous Breathing and Patient connected to nasal cannula oxygen  Post-op Assessment: Report given to PACU RN and Post -op Vital signs reviewed and stable  Post vital signs: Reviewed and stable  Complications: No apparent anesthesia complications

## 2012-12-11 NOTE — Anesthesia Postprocedure Evaluation (Signed)
  Anesthesia Post-op Note  Patient: [name]  Procedure(s) Performed: Procedure(s): THROMBECTOMY ARTERIOVENOUS FISTULA (Left)  Patient Location: PACU  Anesthesia Type:General  Level of Consciousness: awake, alert  and oriented  Airway and Oxygen Therapy: Patient Spontanous Breathing  Post-op Pain: 4 /10, moderate  Post-op Assessment: Post-op Vital signs reviewed, Patient's Cardiovascular Status Stable and Respiratory Function Stable  Post-op Vital Signs: Reviewed and stable  Complications: No apparent anesthesia complications

## 2012-12-11 NOTE — H&P (Signed)
Vascular and Vein Specialist of    Patient name: [name] MRN: [id number] DOB: 05/18/1951 Sex: male    No chief complaint on file.   HISTORY OF PRESENT ILLNESS: The patient is recently status post fistulogram with balloon venoplasty and branch coil embolization this past Tuesday. He developed redness and swelling in his arm following the procedure. They were unable to cannulate his fistula today. He comes in today for surgical evaluation  Past Medical History  Diagnosis Date  . Anemia   . Chronic kidney disease   . Hypertension     Hx: of in the past  . Anxiety   . Depression   . COPD (chronic obstructive pulmonary disease)     Pt denies  . Pneumonia     Hx: of  . GERD (gastroesophageal reflux disease)   . H/O hiatal hernia   . Headache     Hx: Migraines  . Arthritis   . Numbness of toes     Hx: of  . Diabetes mellitus without complication     Hx: of due to steroids    Past Surgical History  Procedure Laterality Date  . Inguinal hernia repair      ,  times   2  . Tonsillectomy    . Av fistula placement  2012       left arm   . Colonoscopy  10/26/2011    Procedure: COLONOSCOPY;  Surgeon: Rogene Houston, MD;  Location: [facility];  Service: Endoscopy;  Laterality: N/A;  730  . Knee arthroscopy  2011    Right Knee    History   Social History  . Marital Status: Married    Spouse Name: N/A    Number of Children: N/A  . Years of Education: N/A   Occupational History  . Not on file.   Social History Main Topics  . Smoking status: Former Smoker -- 10 years    Types: Cigars, Cigarettes    Quit date: 03/13/2012  . Smokeless tobacco: Never Used  . Alcohol Use: No  . Drug Use: No  . Sexually Active: No   Other Topics Concern  . Not on file   Social History Narrative  . No narrative on file    Family History  Problem Relation Age of Onset  . Heart disease Father     Heart Disease before age 79    Allergies as of 12/10/2012 -  Review Complete 12/10/2012  Allergen Reaction Noted  . Morphine and related Other (See Comments) 10/24/2011    No current facility-administered medications on file prior to encounter.   Current Outpatient Prescriptions on File Prior to Encounter  Medication Sig Dispense Refill  . acetaminophen (TYLENOL) 650 MG CR tablet Take 650 mg by mouth 4 (four) times daily.      . B Complex-C-Zn-Folic Acid (DIALYVITE/ZINC PO) Take 1 tablet by mouth daily.      . cholecalciferol (VITAMIN D) 1000 UNITS tablet Take 1,000 Units by mouth daily.      . diazepam (VALIUM) 10 MG tablet Take 10 mg by mouth 3 (three) times daily.      Marland Kitchen docusate sodium (COLACE) 100 MG capsule Take 100 mg by mouth 2 (two) times daily as needed for constipation.       Marland Kitchen FLUoxetine (PROZAC) 20 MG capsule Take 20 mg by mouth daily.      Marland Kitchen oxyCODONE (OXY IR/ROXICODONE) 5 MG immediate release tablet Take 10 mg by mouth 4 (four) times daily.      Marland Kitchen  simvastatin (ZOCOR) 40 MG tablet Take 40 mg by mouth every evening.      . cyclobenzaprine (FLEXERIL) 10 MG tablet Take 10 mg by mouth 3 (three) times daily as needed for muscle spasms.           PHYSICAL EXAMINATION:   Vital signs are BP 126/81  Pulse 69  Temp(Src) 97.2 F (36.2 C) (Oral)  Resp 18  Ht 6\' 4"  (1.93 m)  Wt 233 lb 11 oz (106 kg)  BMI 28.46 kg/m2  SpO2 99% General: The patient appears their stated age. HEENT:  No gross abnormalities Pulmonary:  Non labored breathing Musculoskeletal: There are no major deformities. Neurologic: No focal weakness or paresthesias are detected, Skin: Erythema and edema over the left forearm Psychiatric: The patient has normal affect. Cardiovascular: There is a regular rate and rhythm. Palpable left radial pulse.   Diagnostic Studies None  Assessment: End-stage renal disease with occluded left radiocephalic fistula Plan: I will attempt to perform a thrombectomy and revision of his fistula. If this is not successful he will  require a catheter placement. Patient is in agreement with this plan.  Eldridge Abrahams, M.D. [facility] [place] Office: [phone] Pager:  [phone]

## 2012-12-11 NOTE — Anesthesia Preprocedure Evaluation (Signed)
Anesthesia Evaluation  Patient identified by MRN, date of birth, ID band Patient awake    Reviewed: Allergy & Precautions, H&P , NPO status , Patient's Chart, lab work & pertinent test results  Airway Mallampati: I TM Distance: >3 FB Neck ROM: full    Dental   Pulmonary COPD         Cardiovascular hypertension, Rhythm:regular Rate:Normal     Neuro/Psych  Headaches,    GI/Hepatic hiatal hernia, GERD-  ,  Endo/Other  diabetes, Type 2  Renal/GU ESRF and DialysisRenal disease     Musculoskeletal   Abdominal   Peds  Hematology   Anesthesia Other Findings   Reproductive/Obstetrics                           Anesthesia Physical Anesthesia Plan  ASA: III  Anesthesia Plan: MAC   Post-op Pain Management:    Induction: Intravenous  Airway Management Planned:   Additional Equipment:   Intra-op Plan:   Post-operative Plan:   Informed Consent: I have reviewed the patients History and Physical, chart, labs and discussed the procedure including the risks, benefits and alternatives for the proposed anesthesia with the patient or authorized representative who has indicated his/her understanding and acceptance.     Plan Discussed with: CRNA, Anesthesiologist and Surgeon  Anesthesia Plan Comments:         Anesthesia Quick Evaluation

## 2012-12-12 ENCOUNTER — Telehealth: Payer: Self-pay | Admitting: Surgery

## 2012-12-12 NOTE — Op Note (Signed)
Vascular and Vein Specialists of [facility]  Patient name: [name] MRN: [id number] DOB: 07-Feb-1951 Sex: male  12/11/2012 Pre-operative Diagnosis: Occluded left radiocephalic was Post-operative diagnosis:  Same Surgeon:  Eldridge Abrahams Assistants:  Wray Kearns Procedure:   Thrombectomy and revision of left radiocephalic fistula   #2: Patch angioplasty, left cephalic vein Anesthesia:  Gen. Blood Loss:  See anesthesia record Specimens:  None  Findings:  Occluded left radiocephalic fistula. Just distal to the antecubital crease where previous balloon angioplasty had been performed, there was irregularity within the vein, creating a stenosis. This area was opened and then closed with a bovine pericardial patch   Indications:  The patient previous had a fistulogram with balloon angioplasty of the cephalic vein as well as coil embolization of a large branch. He has had occlusion of his fistula he comes in for surgical repair.  Procedure:  The patient was identified in the holding area and taken to [place] 12  The patient was then placed supine on the table. general anesthesia was administered.  The patient was prepped and draped in the usual sterile fashion.  A time out was called and antibiotics were administered.  Ultrasound was used to identify the connection to the deep system where the previous balloon angioplasty was performed. A longitudinal incision was made in this area. The fistula was dissected out as was the connection to the deep system. The patient was then fully heparinized. I initially made transverse venotomy. I was easily able to pass a #3 and #4 Fogarty Central and remove clot and a good backbleeding. I tried to advance the Fogarty across the arterial venous anastomosis however I was unable to do so. I examined the posterior wall of the vein at the level of the venotomy and appeared to be very irregular. I felt that the vein at this level needed to be opened to better  evaluate this. 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 Since I was unable to advance the Fogarty catheter across the arteriovenous anastomosis, I had to make a second incision. I opened the patient's initial incision selected isolated the arteriovenous anastomosis. The vein was of excellent diameter down at this level. I made a transverse venotomy at the level of the anastomosis. I passed a 3 and 4 Fogarty catheter and a retrograde fashion. I continued to evacuate significant thrombus. I then removed the arterial plug by passing the #3 Fogarty catheter across the anastomosis. Excellent inflow was established. I made multiple passes with the Fogarty to ensure that all the clot had been removed. The vein was then flushed. I selected a bovine pericardial patch and performed patch angioplasty on the open segment of the cephalic vein in the upper forearm. I patched approximately 2-1/2 cm of the vein. This was done with a 6-0 Prolene in running fashion. I then went back to the incision at the wrist and perform the appropriate flushing maneuvers. Blood flow was then reestablished to the fistula. I also closed the transverse venotomy at the level of the arteriovenous anastomosis with a running 6-0 Prolene. The patient had an excellent thrill within his fistula after the case. I did not reverse the heparin. The skin incisions were closed in multiple layers of 30 and 4-0 Vicryl. Dermabond placed on the skin. There were no complications.  Disposition:  To PACU in stable condition.   Theotis Burrow, M.D. [facility] of [place] Office: [phone] Pager:  [phone]

## 2012-12-12 NOTE — Telephone Encounter (Signed)
Message copied by Berniece Salines on Fri Dec 12, 2012 11:08 AM ------      Message from: Richrd Prime      Created: Thu Dec 11, 2012  6:12 PM       1-2 weeks F/U with Dr Quintin Alto - revision AVF ------

## 2012-12-12 NOTE — Telephone Encounter (Signed)
Gave wife deborah the appt info, she will give to pt - kf

## 2012-12-15 ENCOUNTER — Encounter (HOSPITAL_COMMUNITY): Payer: Self-pay | Admitting: Surgery

## 2012-12-17 ENCOUNTER — Encounter (HOSPITAL_COMMUNITY): Payer: Self-pay | Admitting: Pharmacy Technician

## 2012-12-17 ENCOUNTER — Other Ambulatory Visit: Payer: Self-pay | Admitting: *Deleted

## 2012-12-17 MED ORDER — SODIUM CHLORIDE 0.9 % IJ SOLN: 3.0000 mL | INTRAMUSCULAR | Status: DC | PRN

## 2012-12-17 MED ORDER — SODIUM CHLORIDE 0.9 % IV SOLN
INTRAVENOUS | Status: DC
  Administered 2012-12-18 (×3): via INTRAVENOUS

## 2012-12-17 MED ORDER — DEXTROSE 5 % IV SOLN
1.5000 g | INTRAVENOUS | Status: AC
  Administered 2012-12-18: 1.5 g via INTRAVENOUS
  Filled 2012-12-17: qty 1.5

## 2012-12-18 ENCOUNTER — Ambulatory Visit (HOSPITAL_COMMUNITY): Payer: 59 | Admitting: Anesthesiology

## 2012-12-18 ENCOUNTER — Encounter (HOSPITAL_COMMUNITY): Payer: Self-pay | Admitting: *Deleted

## 2012-12-18 ENCOUNTER — Other Ambulatory Visit: Payer: Self-pay | Admitting: *Deleted

## 2012-12-18 ENCOUNTER — Ambulatory Visit (HOSPITAL_COMMUNITY): Payer: 59

## 2012-12-18 ENCOUNTER — Encounter (HOSPITAL_COMMUNITY)
Admission: RE | Disposition: A | Discharge status: Home / Self Care | Payer: Self-pay | Source: Ambulatory Visit | Attending: Vascular Surgery

## 2012-12-18 ENCOUNTER — Ambulatory Visit (HOSPITAL_COMMUNITY)
Admission: RE | Admit: 2012-12-18 | Discharge: 2012-12-18 | Disposition: A | Discharge status: Home / Self Care | Payer: 59 | Source: Ambulatory Visit | Attending: Vascular Surgery | Admitting: Vascular Surgery

## 2012-12-18 ENCOUNTER — Telehealth: Payer: Self-pay | Admitting: Vascular Surgery

## 2012-12-18 ENCOUNTER — Encounter (HOSPITAL_COMMUNITY): Payer: Self-pay | Admitting: Anesthesiology

## 2012-12-18 DIAGNOSIS — J4489 Other specified chronic obstructive pulmonary disease: Secondary | ICD-10-CM | POA: Insufficient documentation

## 2012-12-18 DIAGNOSIS — N186 End stage renal disease: Secondary | ICD-10-CM

## 2012-12-18 DIAGNOSIS — D649 Anemia, unspecified: Secondary | ICD-10-CM | POA: Insufficient documentation

## 2012-12-18 DIAGNOSIS — Z992 Dependence on renal dialysis: Secondary | ICD-10-CM | POA: Insufficient documentation

## 2012-12-18 DIAGNOSIS — Z79899 Other long term (current) drug therapy: Secondary | ICD-10-CM | POA: Insufficient documentation

## 2012-12-18 DIAGNOSIS — E119 Type 2 diabetes mellitus without complications: Secondary | ICD-10-CM | POA: Insufficient documentation

## 2012-12-18 DIAGNOSIS — Z4931 Encounter for adequacy testing for hemodialysis: Secondary | ICD-10-CM

## 2012-12-18 DIAGNOSIS — F17200 Nicotine dependence, unspecified, uncomplicated: Secondary | ICD-10-CM | POA: Insufficient documentation

## 2012-12-18 DIAGNOSIS — J449 Chronic obstructive pulmonary disease, unspecified: Secondary | ICD-10-CM | POA: Insufficient documentation

## 2012-12-18 DIAGNOSIS — Z885 Allergy status to narcotic agent status: Secondary | ICD-10-CM | POA: Insufficient documentation

## 2012-12-18 DIAGNOSIS — I120 Hypertensive chronic kidney disease with stage 5 chronic kidney disease or end stage renal disease: Secondary | ICD-10-CM | POA: Insufficient documentation

## 2012-12-18 HISTORY — PX: INSERTION OF DIALYSIS CATHETER: SHX1324

## 2012-12-18 HISTORY — PX: AV FISTULA PLACEMENT: SHX1204

## 2012-12-18 LAB — GLUCOSE, CAPILLARY: Glucose-Capillary: 119 mg/dL — ABNORMAL HIGH (ref 70–99)

## 2012-12-18 LAB — POCT I-STAT 4, (NA,K, GLUC, HGB,HCT)
Hemoglobin: 9.9 g/dL — ABNORMAL LOW (ref 13.0–17.0)
Potassium: 4.1 mEq/L (ref 3.5–5.1)
Sodium: 137 mEq/L (ref 135–145)

## 2012-12-18 SURGERY — ARTERIOVENOUS (AV) FISTULA CREATION
Anesthesia: General | Site: Neck | Laterality: Left | Wound class: Clean

## 2012-12-18 MED ORDER — SODIUM CHLORIDE 0.9 % IR SOLN
Status: DC | PRN
  Administered 2012-12-18: 08:00:00

## 2012-12-18 MED ORDER — PROTAMINE SULFATE 10 MG/ML IV SOLN
INTRAVENOUS | Status: DC | PRN
  Administered 2012-12-18: 10 mg via INTRAVENOUS
  Administered 2012-12-18 (×2): 20 mg via INTRAVENOUS

## 2012-12-18 MED ORDER — LIDOCAINE HCL (CARDIAC) 20 MG/ML IV SOLN
INTRAVENOUS | Status: DC | PRN
  Administered 2012-12-18: 100 mg via INTRAVENOUS

## 2012-12-18 MED ORDER — PROPOFOL 10 MG/ML IV BOLUS
INTRAVENOUS | Status: DC | PRN
  Administered 2012-12-18: 200 mg via INTRAVENOUS

## 2012-12-18 MED ORDER — ONDANSETRON HCL 4 MG/2ML IJ SOLN: 4.0000 mg | Freq: Once | INTRAMUSCULAR | Status: DC | PRN

## 2012-12-18 MED ORDER — LIDOCAINE-EPINEPHRINE (PF) 1 %-1:200000 IJ SOLN
INTRAMUSCULAR | Status: AC
  Filled 2012-12-18: qty 10

## 2012-12-18 MED ORDER — LIDOCAINE-EPINEPHRINE (PF) 1 %-1:200000 IJ SOLN
INTRAMUSCULAR | Status: DC | PRN
  Administered 2012-12-18: 30 mL

## 2012-12-18 MED ORDER — OXYCODONE-ACETAMINOPHEN 5-325 MG PO TABS: 1.0000 | ORAL_TABLET | ORAL | Status: DC | PRN

## 2012-12-18 MED ORDER — OXYCODONE-ACETAMINOPHEN 5-325 MG PO TABS
ORAL_TABLET | ORAL | Status: AC
  Administered 2012-12-18: 1
  Filled 2012-12-18: qty 2

## 2012-12-18 MED ORDER — DIPHENHYDRAMINE HCL 25 MG PO CAPS
ORAL_CAPSULE | ORAL | Status: AC
  Filled 2012-12-18: qty 2

## 2012-12-18 MED ORDER — EPHEDRINE SULFATE 50 MG/ML IJ SOLN
INTRAMUSCULAR | Status: DC | PRN
  Administered 2012-12-18: 5 mg via INTRAVENOUS
  Administered 2012-12-18: 10 mg via INTRAVENOUS
  Administered 2012-12-18 (×2): 5 mg via INTRAVENOUS

## 2012-12-18 MED ORDER — HEPARIN SODIUM (PORCINE) 1000 UNIT/ML IJ SOLN
INTRAMUSCULAR | Status: DC | PRN
  Administered 2012-12-18: 8000 [IU] via INTRAVENOUS

## 2012-12-18 MED ORDER — PHENYLEPHRINE HCL 10 MG/ML IJ SOLN
INTRAMUSCULAR | Status: DC | PRN
  Administered 2012-12-18: 80 ug via INTRAVENOUS

## 2012-12-18 MED ORDER — FENTANYL CITRATE 0.05 MG/ML IJ SOLN
INTRAMUSCULAR | Status: DC | PRN
  Administered 2012-12-18: 25 ug via INTRAVENOUS
  Administered 2012-12-18: 50 ug via INTRAVENOUS
  Administered 2012-12-18 (×3): 25 ug via INTRAVENOUS

## 2012-12-18 MED ORDER — HYDROMORPHONE HCL PF 1 MG/ML IJ SOLN: 0.2500 mg | INTRAMUSCULAR | Status: DC | PRN

## 2012-12-18 MED ORDER — HEPARIN SODIUM (PORCINE) 1000 UNIT/ML IJ SOLN
INTRAMUSCULAR | Status: DC | PRN
  Administered 2012-12-18: 4.6 mL

## 2012-12-18 MED ORDER — THROMBIN 20000 UNITS EX SOLR
CUTANEOUS | Status: AC
  Filled 2012-12-18: qty 20000

## 2012-12-18 MED ORDER — ONDANSETRON HCL 4 MG/2ML IJ SOLN
INTRAMUSCULAR | Status: DC | PRN
  Administered 2012-12-18: 4 mg via INTRAVENOUS

## 2012-12-18 MED ORDER — HEPARIN SODIUM (PORCINE) 1000 UNIT/ML IJ SOLN
INTRAMUSCULAR | Status: AC
  Filled 2012-12-18: qty 1

## 2012-12-18 MED ORDER — DIPHENHYDRAMINE HCL 25 MG PO TABS
50.0000 mg | ORAL_TABLET | Freq: Once | ORAL | Status: AC
  Administered 2012-12-18: 50 mg via ORAL

## 2012-12-18 MED ORDER — SODIUM CHLORIDE 0.9 % IR SOLN
Status: DC | PRN
  Administered 2012-12-18: 1

## 2012-12-18 SURGICAL SUPPLY — 68 items
ARMBAND PINK RESTRICT EXTREMIT (MISCELLANEOUS) ×4 IMPLANT
BAG DECANTER FOR FLEXI CONT (MISCELLANEOUS) IMPLANT
CANISTER SUCTION 2500CC (MISCELLANEOUS) ×4 IMPLANT
CATH CANNON HEMO 15F 50CM (CATHETERS) IMPLANT
CATH CANNON HEMO 15FR 19 (HEMODIALYSIS SUPPLIES) IMPLANT
CATH CANNON HEMO 15FR 23CM (HEMODIALYSIS SUPPLIES) ×4 IMPLANT
CATH CANNON HEMO 15FR 31CM (HEMODIALYSIS SUPPLIES) IMPLANT
CATH CANNON HEMO 15FR 32CM (HEMODIALYSIS SUPPLIES) IMPLANT
CATH EMB 4FR 80CM (CATHETERS) IMPLANT
CHLORAPREP W/TINT 26ML (MISCELLANEOUS) ×4 IMPLANT
CLIP TI MEDIUM 6 (CLIP) ×4 IMPLANT
CLIP TI WIDE RED SMALL 6 (CLIP) ×4 IMPLANT
CLOTH BEACON ORANGE TIMEOUT ST (SAFETY) ×4 IMPLANT
COVER PROBE W GEL 5X96 (DRAPES) ×4 IMPLANT
COVER SURGICAL LIGHT HANDLE (MISCELLANEOUS) ×4 IMPLANT
DERMABOND ADVANCED (GAUZE/BANDAGES/DRESSINGS) ×3
DERMABOND ADVANCED .7 DNX12 (GAUZE/BANDAGES/DRESSINGS) ×9 IMPLANT
DRAPE C-ARM 42X72 X-RAY (DRAPES) ×4 IMPLANT
DRAPE CHEST BREAST 15X10 FENES (DRAPES) ×4 IMPLANT
DRAPE X-RAY CASS 24X20 (DRAPES) IMPLANT
ELECT REM PT RETURN 9FT ADLT (ELECTROSURGICAL) ×4
ELECTRODE REM PT RTRN 9FT ADLT (ELECTROSURGICAL) ×3 IMPLANT
GAUZE SPONGE 2X2 8PLY STRL LF (GAUZE/BANDAGES/DRESSINGS) ×3 IMPLANT
GAUZE SPONGE 4X4 16PLY XRAY LF (GAUZE/BANDAGES/DRESSINGS) IMPLANT
GEL ULTRASOUND 20GR AQUASONIC (MISCELLANEOUS) IMPLANT
GLOVE BIO SURGEON STRL SZ7.5 (GLOVE) ×8 IMPLANT
GLOVE BIOGEL PI IND STRL 6.5 (GLOVE) ×3 IMPLANT
GLOVE BIOGEL PI IND STRL 7.0 (GLOVE) ×9 IMPLANT
GLOVE BIOGEL PI IND STRL 7.5 (GLOVE) ×6 IMPLANT
GLOVE BIOGEL PI IND STRL 8 (GLOVE) ×6 IMPLANT
GLOVE BIOGEL PI INDICATOR 6.5 (GLOVE) ×1
GLOVE BIOGEL PI INDICATOR 7.0 (GLOVE) ×3
GLOVE BIOGEL PI INDICATOR 7.5 (GLOVE) ×2
GLOVE BIOGEL PI INDICATOR 8 (GLOVE) ×2
GLOVE ECLIPSE 7.0 STRL STRAW (GLOVE) ×4 IMPLANT
GLOVE SURG SS PI 7.5 STRL IVOR (GLOVE) ×8 IMPLANT
GOWN PREVENTION PLUS XLARGE (GOWN DISPOSABLE) ×8 IMPLANT
GOWN STRL NON-REIN LRG LVL3 (GOWN DISPOSABLE) ×12 IMPLANT
KIT BASIN OR (CUSTOM PROCEDURE TRAY) ×4 IMPLANT
KIT ROOM TURNOVER OR (KITS) ×8 IMPLANT
NEEDLE 18GX1X1/2 (RX/OR ONLY) (NEEDLE) ×4 IMPLANT
NEEDLE 22X1 1/2 (OR ONLY) (NEEDLE) ×4 IMPLANT
NEEDLE HYPO 25GX1X1/2 BEV (NEEDLE) ×4 IMPLANT
NS IRRIG 1000ML POUR BTL (IV SOLUTION) ×4 IMPLANT
PACK CV ACCESS (CUSTOM PROCEDURE TRAY) ×4 IMPLANT
PACK SURGICAL SETUP 50X90 (CUSTOM PROCEDURE TRAY) IMPLANT
PAD ARMBOARD 7.5X6 YLW CONV (MISCELLANEOUS) ×12 IMPLANT
SET COLLECT BLD 21X3/4 12 (NEEDLE) IMPLANT
SPONGE GAUZE 2X2 STER 10/PKG (GAUZE/BANDAGES/DRESSINGS) ×1
SPONGE SURGIFOAM ABS GEL 100 (HEMOSTASIS) IMPLANT
STOPCOCK 4 WAY LG BORE MALE ST (IV SETS) IMPLANT
SUT ETHILON 3 0 PS 1 (SUTURE) ×4 IMPLANT
SUT PROLENE 6 0 BV (SUTURE) ×8 IMPLANT
SUT VIC AB 3-0 SH 27 (SUTURE) ×1
SUT VIC AB 3-0 SH 27X BRD (SUTURE) ×3 IMPLANT
SUT VICRYL 4-0 PS2 18IN ABS (SUTURE) ×8 IMPLANT
SYR 20CC LL (SYRINGE) ×4 IMPLANT
SYR 30ML LL (SYRINGE) IMPLANT
SYR 5ML LL (SYRINGE) ×8 IMPLANT
SYR 5ML LUER SLIP (SYRINGE) IMPLANT
SYR CONTROL 10ML LL (SYRINGE) IMPLANT
SYRINGE 10CC LL (SYRINGE) ×4 IMPLANT
TAPE CLOTH SURG 4X10 WHT LF (GAUZE/BANDAGES/DRESSINGS) ×4 IMPLANT
TOWEL OR 17X24 6PK STRL BLUE (TOWEL DISPOSABLE) ×4 IMPLANT
TOWEL OR 17X26 10 PK STRL BLUE (TOWEL DISPOSABLE) ×8 IMPLANT
TUBING EXTENTION W/L.L. (IV SETS) IMPLANT
UNDERPAD 30X30 INCONTINENT (UNDERPADS AND DIAPERS) ×4 IMPLANT
WATER STERILE IRR 1000ML POUR (IV SOLUTION) ×4 IMPLANT

## 2012-12-18 NOTE — Interval H&P Note (Signed)
History and Physical Interval Note:  12/18/2012 8:23 AM  [name]  has presented today for surgery, with the diagnosis of CLOTTED LEFT FISTULA  The various methods of treatment have been discussed with the patient and family. After consideration of risks, benefits and other options for treatment, the patient has consented to  Procedure(s): THROMBECTOMY ARTERIOVENOUS FISTULA (Left) as a surgical intervention .  The patient's history has been reviewed, patient examined, no change in status, stable for surgery.  I have reviewed the patient's chart and labs.  Questions were answered to the patient's satisfaction.     DICKSON,CHRISTOPHER S

## 2012-12-18 NOTE — Transfer of Care (Signed)
Immediate Anesthesia Transfer of Care Note  Patient: [name]  Procedure(s) Performed: Procedure(s): ARTERIOVENOUS (AV) FISTULA CREATION (Left) INSERTION OF DIALYSIS CATHETER (Left)  Patient Location: PACU  Anesthesia Type:General  Level of Consciousness: patient cooperative and responds to stimulation  Airway & Oxygen Therapy: Patient Spontanous Breathing and Patient connected to nasal cannula oxygen  Post-op Assessment: Report given to PACU RN and Post -op Vital signs reviewed and stable  Post vital signs: Reviewed and stable  Complications: No apparent anesthesia complications

## 2012-12-18 NOTE — Anesthesia Postprocedure Evaluation (Signed)
  Anesthesia Post-op Note  Patient: [name]  Procedure(s) Performed: Procedure(s): ARTERIOVENOUS (AV) FISTULA CREATION (Left) INSERTION OF DIALYSIS CATHETER (Left)  Patient Location: PACU  Anesthesia Type:General  Level of Consciousness: awake, alert , oriented and patient cooperative  Airway and Oxygen Therapy: Patient Spontanous Breathing  Post-op Pain: mild  Post-op Assessment: Post-op Vital signs reviewed, Patient's Cardiovascular Status Stable, Respiratory Function Stable, Patent Airway, No signs of Nausea or vomiting and Pain level controlled  Post-op Vital Signs: stable  Complications: No apparent anesthesia complications

## 2012-12-18 NOTE — Op Note (Signed)
NAME: [name]   MRN: [id number] DOB: [birth year]    DATE OF OPERATION: 12/18/2012  PREOP DIAGNOSIS: chronic kidney disease  POSTOP DIAGNOSis: same  PROCEDURE:  1. New left brachiocephalic AV fistula 2. Ultrasound-guided placement of right IJ tunneled dialysis catheter  SURGEON: Judeth Cornfield. Scot Dock, MD, FACS  ASSIST: Angelica Pou RNFA  ANESTHESIA: Gen.   EBL: minimal  INDICATIONS: [name] is a 62 y.o. male who has a clotted left forearm AV fistula. This patient had a recent venoplasty of the stenosis. The fistula then clotted. He then underwent thrombectomy with patch angioplasty of the stenosis. Given the problems that have this fistula I felt that the best option was to place new access.  FINDINGS: 4.5 mm upper arm cephalic vein.  TECHNIQUE: Patient was taken to the operating room and received a general anesthetic. The left upper extremity was prepped and draped in the usual sterile fashion. A transverse incision was made above the antecubital level. Here the cephalic vein was dissected free and ligated distally. It irrigated up nicely with heparinized saline. It was a 4.5 mm vein. The brachial artery was dissected free beneath the fascia. The patient was heparinized. The brachial artery was clamped proximally and distally and a longitudinal arteriotomy was made. The vein was sewn end-to-side to the artery using continuous 6-0 Prolene suture. At the completion was excellent thrill in the fistula and a palpable radial pulse. The heparin was partially reversed with protamine the wound was closed with deep layer 3-0 Vicryl and the skin closed with 4-0 Vicryl. Dermabond was applied.  Next attention was turned to placement of a tunneled dialysis catheter. The neck and upper chest were prepped and draped in the usual sterile fashion. Under ultrasound guidance, the right IJ was cannulated and a guidewire introduced into the superior vena cava under fluoroscopic control. The tract was  dilated. The dilator and peel-away sheath were passed over the wire and the wire and dilator were removed. The 23 cm catheter was passed through the peel-away sheath and positioned in the right atrium. The exit site the catheter was selected and the skin anesthetized between the 2 areas. Cath was then brought through the tunnel, cut to the appropriate length and the distal ports were attached. Ports withdrew easily within with heparinized saline and filled with concentrated heparin. The cath was secured at its exit site with a 3-0 nylon suture. The IJ cannulation site was closed with 40 subcutaneous stitch. Sterile dressing was applied. Patient tolerated the procedure well and was transferred to the recovery room in stable condition. Only one sponge counts were correct.  Deitra Mayo, MD, FACS [facility] of [facility]  DATE OF DICTATION:   12/18/2012

## 2012-12-18 NOTE — Preoperative (Signed)
Beta Blockers   Reason not to administer Beta Blockers:Not Applicable 

## 2012-12-18 NOTE — Telephone Encounter (Addendum)
Message copied by Doristine Section on Thu Dec 18, 2012 12:40 PM ------      Message from: Alfonso Patten      Created: Thu Dec 18, 2012 11:33 AM      Regarding: FW: charge and f/u                   ----- Message -----         From: Angelia Mould, MD         Sent: 12/18/2012  10:20 AM           To: Patrici Ranks, Alfonso Patten, RN, #      Subject: charge and f/u                                           PROCEDURE:       1. New left brachiocephalic AV fistula      2. Ultrasound-guided placement of right IJ tunneled dialysis catheter            SURGEON: Judeth Cornfield. Scot Dock, MD, FACS            ASSIST: Angelica Pou RNFA            He will need a follow up visit in 6 weeks to check on the maturation of this fistula. He will need a duplex at that time to check size and depth of the fistula. Thank you. CD ------  notified patient of fu appt. on [phone] at [facility]

## 2012-12-18 NOTE — Progress Notes (Signed)
Patient arrive at [facility] post op c/o itching to arms, abdomen, and legs . Noted redness and raised areas on abdomen and legs from scratching. Dr Tamala Julian made aware . Orders received to give Benadryl  50 mg po, patient was observed 30 mins after dose given. D/C home in no acute distress. Patient was to leave here and go to HD .

## 2012-12-18 NOTE — Anesthesia Preprocedure Evaluation (Addendum)
Anesthesia Evaluation  Patient identified by MRN, date of birth, ID band Patient awake    Reviewed: Allergy & Precautions, H&P , NPO status , Patient's Chart, lab work & pertinent test results  Airway Mallampati: I TM Distance: >3 FB Neck ROM: Full    Dental  (+) Edentulous Upper, Edentulous Lower, Upper Dentures, Lower Dentures and Dental Advisory Given   Pulmonary COPDformer smoker,          Cardiovascular hypertension, Rhythm:regular Rate:Normal     Neuro/Psych  Headaches, Anxiety Depression    GI/Hepatic hiatal hernia, GERD-  ,  Endo/Other  diabetes, Type 2  Renal/GU ESRF and DialysisRenal disease     Musculoskeletal   Abdominal   Peds  Hematology  (+) anemia ,   Anesthesia Other Findings   Reproductive/Obstetrics                          Anesthesia Physical Anesthesia Plan  ASA: III  Anesthesia Plan: General   Post-op Pain Management:    Induction: Intravenous  Airway Management Planned: Oral ETT and LMA  Additional Equipment:   Intra-op Plan:   Post-operative Plan: Extubation in OR  Informed Consent: I have reviewed the patients History and Physical, chart, labs and discussed the procedure including the risks, benefits and alternatives for the proposed anesthesia with the patient or authorized representative who has indicated his/her understanding and acceptance.     Plan Discussed with: CRNA, Anesthesiologist and Surgeon  Anesthesia Plan Comments:         Anesthesia Quick Evaluation

## 2012-12-18 NOTE — H&P (Signed)
Vascular and Vein Specialist of [place]  Patient name: [name] MRN: [id number] DOB: 11/19/1950 Sex: male  REASON FOR CONSULT: Clotted left AVF.  HPI: [name] is a 62 y.o. male who dialyzes on MWF. His left radial cephalic AVF has been in for around 18 months. He has had PTA of venous stenosis and recent thrombectomy and patch angioplasty  Of the fistula 6 days ago. The fistula is not occluded again.   Past Medical History  Diagnosis Date  . Anemia   . Chronic kidney disease   . Hypertension     Hx: of in the past  . Anxiety   . Depression   . COPD (chronic obstructive pulmonary disease)     Pt denies  . Pneumonia     Hx: of  . GERD (gastroesophageal reflux disease)   . H/O hiatal hernia   . Headache     Hx: Migraines  . Arthritis   . Numbness of toes     Hx: of  . Diabetes mellitus without complication     Hx: of due to steroids    Family History  Problem Relation Age of Onset  . Heart disease Father     Heart Disease before age 15   SOCIAL HISTORY: History  Substance Use Topics  . Smoking status: Former Smoker -- 10 years    Types: Cigars, Cigarettes    Quit date: 03/13/2012  . Smokeless tobacco: Never Used  . Alcohol Use: No   Allergies  Allergen Reactions  . Morphine And Related Other (See Comments)    Not effective    Current Facility-Administered Medications  Medication Dose Route Frequency Provider Last Rate Last Dose  . 0.9 %  sodium chloride infusion   Intravenous Continuous Angelia Mould, MD      . cefUROXime (ZINACEF) 1.5 g in dextrose 5 % 50 mL IVPB  1.5 g Intravenous 30 min Pre-Op Angelia Mould, MD      . heparin 6,000 Units in sodium chloride irrigation 0.9 % 500 mL irrigation    PRN Angelia Mould, MD      . lidocaine-EPINEPHrine Prudy Feeler) 1 %-1:200000 (with pres) injection    PRN Angelia Mould, MD   30 mL at 12/18/12 0821  . sodium chloride 0.9 % injection 3 mL  3 mL Intravenous  PRN Serafina Mitchell, MD       REVIEW OF SYSTEMS: Valu.Nieves ] denotes positive finding; [  ] denotes negative finding CARDIOVASCULAR:  [ ]  chest pain   [ ]  chest pressure   [ ]  palpitations   [ ]  orthopnea   [ ]  dyspnea on exertion   [ ]  claudication   [ ]  rest pain   [ ]  DVT   [ ]  phlebitis PULMONARY:   [ ]  productive cough   [ ]  asthma   [ ]  wheezing NEUROLOGIC:   [ ]  weakness  [ ]  paresthesias  [ ]  aphasia  [ ]  amaurosis  [ ]  dizziness HEMATOLOGIC:   [ ]  bleeding problems   [ ]  clotting disorders MUSCULOSKELETAL:  [ ]  joint pain   [ ]  joint swelling [ ]  leg swelling GASTROINTESTINAL: [ ]   blood in stool  [ ]   hematemesis GENITOURINARY:  [ ]   dysuria  [ ]   hematuria PSYCHIATRIC:  [ ]  history of major depression INTEGUMENTARY:  [ ]  rashes  [ ]  ulcers CONSTITUTIONAL:  [ ]  fever   [ ]  chills  PHYSICAL EXAM: Filed Vitals:  12/18/12 0632  BP: 115/77  Pulse: 82  Temp: 98.2 F (36.8 C)  TempSrc: Oral  Resp: 18  SpO2: 97%   There is no weight on file to calculate BMI. GENERAL: The patient is a well-nourished male, in no acute distress. The vital signs are documented above. CARDIOVASCULAR: There is a regular rate and rhythm.  PULMONARY: There is good air exchange bilaterally without wheezing or rales. ABDOMEN: Soft and non-tender with normal pitched bowel sounds.  MUSCULOSKELETAL: There are no major deformities or cyanosis. NEUROLOGIC: No focal weakness or paresthesias are detected. SKIN: There are no ulcers or rashes noted. PSYCHIATRIC: The patient has a normal affect. The left AVF is occluded.   DATA:  Lab Results  Component Value Date   WBC 6.4 11/29/2010   HGB 9.9* 12/18/2012   HCT 29.0* 12/18/2012   MCV 93.3 11/29/2010   PLT 86* 11/29/2010   Lab Results  Component Value Date   NA 137 12/18/2012   K 4.1 12/18/2012   CL 99 12/09/2012   CO2 20 11/29/2010   Lab Results  Component Value Date   CREATININE 7.10* 12/09/2012   Lab Results  Component Value Date   INR 0.94 11/27/2010   INR  0.97 08/24/2010   Lab Results  Component Value Date   HGBA1C  Value: 11.2 (NOTE)                                                                       According to the ADA Clinical Practice Recommendations for 2011, when HbA1c is used as a screening test:   >=6.5%   Diagnostic of Diabetes Mellitus           (if abnormal result  is confirmed)  5.7-6.4%   Increased risk of developing Diabetes Mellitus  References:Diagnosis and Classification of Diabetes Mellitus,Diabetes [id number] 1):S62-S69 and Standards of Medical Care in         Diabetes - 2011,Diabetes Care,2011,34  (Suppl 1):S11-S61.* 11/27/2010   MEDICAL ISSUES: Plan thrombectomy of left AVF vs new access and Diatek.   [place] [facility] [place] Beeper: [phone]

## 2012-12-18 NOTE — H&P (View-Only) (Signed)
[facility] [place]   Patient name: [name] MRN: [id number] DOB: 12-Dec-1950 Sex: male    No chief complaint on file.   HISTORY OF PRESENT ILLNESS: The patient has a left radiocephalic fistula that is difficult to cannulate. He comes in today for further evaluation. He had a duplex in the office which showed a possible arterial venous anastomotic stenosis as well as several areas within the fistula that were stenoti  Past Medical History  Diagnosis Date  . Anemia   . COPD (chronic obstructive pulmonary disease)   . Diabetes mellitus without complication   . Chronic kidney disease     Past Surgical History  Procedure Laterality Date  . Inguinal hernia repair      ,  times   2  . Tonsillectomy    . Av fistula placement  2012       left arm   . Colonoscopy  10/26/2011    Procedure: COLONOSCOPY;  Surgeon: Rogene Houston, MD;  [facility];  Service: Endoscopy;  Laterality: N/A;  730  . Knee arthroscopy  2011    Right Knee    History   Social History  . Marital Status: Married    Spouse Name: N/A    Number of Children: N/A  . Years of Education: N/A   Occupational History  . Not on file.   Social History Main Topics  . Smoking status: Current Every Day Smoker -- 10 years    Types: Cigars, Cigarettes  . Smokeless tobacco: Not on file  . Alcohol Use: No  . Drug Use: No  . Sexually Active:    Other Topics Concern  . Not on file   Social History Narrative  . No narrative on file    Family History  Problem Relation Age of Onset  . Heart disease Father     Heart Disease before age 70    Allergies as of 12/02/2012 - Review Complete 06/06/2012  Allergen Reaction Noted  . Morphine and related Other (See Comments) 10/24/2011    No current facility-administered medications on file prior to encounter.   Current Outpatient Prescriptions on File Prior to Encounter  Medication Sig Dispense Refill  . acetaminophen (TYLENOL)  650 MG CR tablet Take 650 mg by mouth 4 (four) times daily.      . B Complex-C-Zn-Folic Acid (DIALYVITE/ZINC PO) Take 1 tablet by mouth daily.      . diazepam (VALIUM) 10 MG tablet Take 10 mg by mouth 3 (three) times daily.      Marland Kitchen oxyCODONE (OXY IR/ROXICODONE) 5 MG immediate release tablet Take 10 mg by mouth 4 (four) times daily.         REVIEW OF SYSTEMS: Cardiovascular: No chest pain,  Pulmonary: No productive cough Neurologic: No weakness, paresthesias, aphasia, or amaurosis. No dizziness. Constitutional: No fever or chills.  PHYSICAL EXAMINATION:   Vital signs are BP 137/85  Pulse 76  Temp(Src) 97 F (36.1 C) (Oral)  Resp 18  Ht 6\' 4"  (1.93 m)  Wt 220 lb (99.791 kg)  BMI 26.79 kg/m2  SpO2 100% General: The patient appears their stated age. HEENT:  No gross abnormalities Pulmonary:  Non labored breathing Musculoskeletal: There are no major deformities. Neurologic: No focal weakness or paresthesias are detected, Skin: There are no ulcer or rashes noted. Psychiatric: The patient has normal affect. Cardiovascular: There is a regular rate and rhythm without significant murmur appreciated.   Assessment:  end-stage renal disease  Plan:  fistulogram. Intervention will be based on the images   V. Leia Alf, M.D. [facility] [place] Office: [phone] Pager:  [phone]

## 2012-12-19 ENCOUNTER — Telehealth: Payer: Self-pay

## 2012-12-19 ENCOUNTER — Encounter (HOSPITAL_COMMUNITY): Payer: Self-pay | Admitting: Vascular Surgery

## 2012-12-19 NOTE — Telephone Encounter (Signed)
Rec'd call from the nurse @ [facility].  Reports that pt. C/o having numbness/ tingling of left middle, ring, and pinkie finger.  States that left arm/hand "is not unusually swollen".  States that (L) hand/fingers feel warm.  Encouraged to have pt. Continue to monitor for change in numbness; advised that numbness in 3 fingers may be r/t nerve irritation.  Advised that pt. should elevate left arm, and do gentle ROM with fingers.  Enc. to call if symptoms progress; such as numbness extending to more fingers/ hand, change in temperature of left fingers/hand to cool, decreased mobility and increased pain in fingers/hand.  Nurse @ [facility] will inform pt. of symptoms to report.

## 2012-12-22 ENCOUNTER — Ambulatory Visit: Payer: Medicare Other | Admitting: Surgery

## 2013-02-03 ENCOUNTER — Encounter: Payer: Self-pay | Admitting: Vascular Surgery

## 2013-02-04 ENCOUNTER — Ambulatory Visit (INDEPENDENT_AMBULATORY_CARE_PROVIDER_SITE_OTHER): Payer: 59 | Admitting: Vascular Surgery

## 2013-02-04 ENCOUNTER — Encounter: Payer: Self-pay | Admitting: Vascular Surgery

## 2013-02-04 ENCOUNTER — Encounter (INDEPENDENT_AMBULATORY_CARE_PROVIDER_SITE_OTHER): Payer: 59 | Admitting: *Deleted

## 2013-02-04 VITALS — BP 126/82 | HR 88 | Resp 16 | Ht 76.0 in | Wt 241.0 lb

## 2013-02-04 DIAGNOSIS — R200 Anesthesia of skin: Secondary | ICD-10-CM | POA: Insufficient documentation

## 2013-02-04 DIAGNOSIS — Z4931 Encounter for adequacy testing for hemodialysis: Secondary | ICD-10-CM

## 2013-02-04 DIAGNOSIS — R209 Unspecified disturbances of skin sensation: Secondary | ICD-10-CM | POA: Insufficient documentation

## 2013-02-04 DIAGNOSIS — N186 End stage renal disease: Secondary | ICD-10-CM

## 2013-02-04 DIAGNOSIS — R238 Other skin changes: Secondary | ICD-10-CM

## 2013-02-04 DIAGNOSIS — T82598A Other mechanical complication of other cardiac and vascular devices and implants, initial encounter: Secondary | ICD-10-CM

## 2013-02-04 DIAGNOSIS — R202 Paresthesia of skin: Secondary | ICD-10-CM | POA: Insufficient documentation

## 2013-02-04 NOTE — Progress Notes (Signed)
[facility] [place]  Patient name: [name] MRN: [id number] DOB: 01/08/1951 Sex: male  REASON FOR VISIT: follow up after left brachiocephalic AV fistula on [id number].  HPI: [name] is a 62 y.o. male who had a left brachiocephalic fistula placed on 12/28/2012. He was noted to have a 4.5 mm upper arm cephalic vein. He comes in for a routine follow up visit. He has had some paresthesias along his left palm likely related to the radial incision from previous radiocephalic fistula which was not successful.  REVIEW OF SYSTEMS: Valu.Nieves ] denotes positive finding; [  ] denotes negative finding  CARDIOVASCULAR:  [ ]  chest pain   [ ]  dyspnea on exertion    CONSTITUTIONAL:  [ ]  fever   [ ]  chills  PHYSICAL EXAM: Filed Vitals:   02/04/13 1527  BP: 126/82  Pulse: 88  Resp: 16  Height: 6\' 4"  (1.93 m)  Weight: 241 lb (109.317 kg)  SpO2: 99%   Body mass index is 29.35 kg/(m^2). GENERAL: The patient is a well-nourished male, in no acute distress. The vital signs are documented above. CARDIOVASCULAR: There is a regular rate and rhythm  PULMONARY: There is good air exchange bilaterally without wheezing or rales. His left upper arm fistula has an excellent bruit and thrill and appears to be maturing nicely.  I have independently interpreted his duplex of his fistula which shows that diameters range from 0.61-1.0 cm. There is one branch in the mid upper arm. Depths are reasonable at 0.47 cm in the proximal fistula.  MEDICAL ISSUES: His left brachiocephalic fistula appears to be maturing adequately. It should be ready for cannulation and late July. He will continue the exercises arm is the fistula continues to mature. We will see him back as needed.  [place] [facility] [place] Beeper: [phone]

## 2013-04-06 ENCOUNTER — Other Ambulatory Visit: Payer: Self-pay

## 2013-04-06 DIAGNOSIS — N186 End stage renal disease: Secondary | ICD-10-CM

## 2013-04-06 DIAGNOSIS — T82898A Other specified complication of vascular prosthetic devices, implants and grafts, initial encounter: Secondary | ICD-10-CM

## 2013-04-07 ENCOUNTER — Other Ambulatory Visit (INDEPENDENT_AMBULATORY_CARE_PROVIDER_SITE_OTHER): Payer: Self-pay | Admitting: *Deleted

## 2013-04-07 ENCOUNTER — Ambulatory Visit (INDEPENDENT_AMBULATORY_CARE_PROVIDER_SITE_OTHER): Payer: Medicare Other | Admitting: Internal Medicine

## 2013-04-07 ENCOUNTER — Encounter (INDEPENDENT_AMBULATORY_CARE_PROVIDER_SITE_OTHER): Payer: Self-pay | Admitting: *Deleted

## 2013-04-07 ENCOUNTER — Encounter (INDEPENDENT_AMBULATORY_CARE_PROVIDER_SITE_OTHER): Payer: Self-pay | Admitting: Internal Medicine

## 2013-04-07 VITALS — BP 134/56 | HR 104 | Temp 99.2°F | Ht 76.0 in | Wt 249.6 lb

## 2013-04-07 DIAGNOSIS — R1310 Dysphagia, unspecified: Secondary | ICD-10-CM

## 2013-04-07 DIAGNOSIS — R195 Other fecal abnormalities: Secondary | ICD-10-CM

## 2013-04-07 DIAGNOSIS — D649 Anemia, unspecified: Secondary | ICD-10-CM

## 2013-04-07 NOTE — Patient Instructions (Addendum)
EGD/ED. If normal Given's capsule.

## 2013-04-07 NOTE — Progress Notes (Signed)
Subjective:     Patient ID: [name], male   DOB: 1951/03/05, 62 y.o.   MRN: [id number]  HPI Referred to our office by Dr. Hinda Lenis for positive hemocult cards. He has never noticed any blood in his stools except if he is constipated. He tells me he has hemorrhoids. He usually has one daily. Usually light brown. If he skips a day he will take a stool softner. His appetite is good. No weight loss.  Occasionally has abdominal pain (mid abdomen). He has acid reflux daily.  He take Prevacid on a prn basis.  Shunt left arm not in use due to swelling. Has an appt Thursday at the [facility] for the swelling.  Central line rt subclavian.   No family hx of colon cancer Occasionally has dysphagia to breads. Takes Prevacid on a prn basis.  Last EGD in 2012 for dysphagia.  H pylori has been negative in the past.     10/26/2011 Colonoscopy: Indications: Patient is 62 year old Caucasian man who is undergoing pre-renal transplant screening colonoscopy.  mpression:  Examination phone to cecum.  Pancolonic diverticulosis( mild).  Small cecal polyp ablated via cold biopsy.  Three polyps mid ranging in size from 6-10 mm. One was located at ascending colon and the other 2 hepatic flexure.  Two small polyps could not be removed.  Internal/external hemorrhoids. Biopsy:  Biopsy results reviewed with patient's wife. 3 of the 4 polyps That were removed, three turned out to be tubular adenomas. 2 small polyps could not be removed. Next colonoscopy in 3 years. Report to PCP   EGDED  11/29/2010 for dysphagia.  Mild changes of Candida esophagitis limited to proximal esophagus.  Suspect this is resolving or treated Candida. Erosive/ulcerative reflux esophagitis with non-critical ringand a small sliding hiatal hernia. Erosive antral gastritis.   He tells me every Wednesday he receives IV iron at the Dialysis unit.  He takes Dialysis M-W-F. He has been receiving iron x 6 months.  Started Dialysis 8 months.  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  04/01/2013 H and H 9.7 and 29.1, Iron 130, Ferritin 527, MCV 108.1 Two years ago his hemoglobin was 12.7  He is married. Two children. Children in good health. Patient does not work.,   Review of Systems see hpi Current Outpatient Prescriptions  Medication Sig Dispense Refill  . acetaminophen (TYLENOL) 650 MG CR tablet Take 650 mg by mouth 4 (four) times daily.      . B Complex-C-Zn-Folic Acid (DIALYVITE/ZINC PO) Take 1 tablet by mouth daily.      . cholecalciferol (VITAMIN D) 1000 UNITS tablet Take 1,000 Units by mouth daily.      . cyclobenzaprine (FLEXERIL) 10 MG tablet Take 10 mg by mouth 3 (three) times daily as needed for muscle spasms.      . diazepam (VALIUM) 10 MG tablet Take 10 mg by mouth 3 (three) times daily.      . diphenhydrAMINE (BENADRYL) 25 mg capsule Take 25 mg by mouth every 6 (six) hours as needed for itching.      . docusate sodium (COLACE) 100 MG capsule Take 100 mg by mouth 2 (two) times daily as needed for constipation.       Marland Kitchen FLUoxetine (PROZAC) 20 MG capsule Take 20 mg by mouth daily.      Marland Kitchen lanthanum (FOSRENOL) 1000 MG chewable tablet Chew 1,000 mg by mouth 3 (three) times daily with meals.      Marland Kitchen oxyCODONE (OXY IR/ROXICODONE) 5 MG immediate release  tablet Take 2 tablets (10 mg total) by mouth 4 (four) times daily.  30 tablet  0  . polyethylene glycol (MIRALAX / GLYCOLAX) packet Take 17 g by mouth daily.      . Pseudoeph-Doxylamine-DM-APAP (NYQUIL PO) Take 30 mLs by mouth at bedtime as needed (for sleep).      . Sevelamer Carbonate (RENVELA PO) Take by mouth. 4 tabs daily three times a day and 2 tabs daily with snacks.      . simvastatin (ZOCOR) 40 MG tablet Take 40 mg by mouth every evening.       No current facility-administered medications for this visit.   Past Medical History  Diagnosis Date  . Anemia   . Chronic kidney disease   . Hypertension     Hx: of in the past  . Anxiety   . Depression   . COPD  (chronic obstructive pulmonary disease)     Pt denies  . Pneumonia     Hx: of  . GERD (gastroesophageal reflux disease)   . H/O hiatal hernia   . Headache(784.0)     Hx: Migraines  . Arthritis   . Numbness of toes     Hx: of  . Diabetes mellitus without complication     Hx: of due to steroids   Past Surgical History  Procedure Laterality Date  . Inguinal hernia repair      ,  times   2  . Tonsillectomy    . Av fistula placement  2012       left arm   . Colonoscopy  10/26/2011    Procedure: COLONOSCOPY;  Surgeon: Rogene Houston, MD;  [facility];  Service: Endoscopy;  Laterality: N/A;  730  . Knee arthroscopy  2011    Right Knee  . Thrombectomy w/ embolectomy Left 12/11/2012    Procedure: THROMBECTOMY ARTERIOVENOUS FISTULA;  Surgeon: Serafina Mitchell, MD;  Location: [place];  Service: Vascular;  Laterality: Left;  . Av fistula placement Left 12/18/2012    Procedure: ARTERIOVENOUS (AV) FISTULA CREATION;  Surgeon: Angelia Mould, MD;  Location: [place];  Service: Vascular;  Laterality: Left;  . Insertion of dialysis catheter Left 12/18/2012    Procedure: INSERTION OF DIALYSIS CATHETER;  Surgeon: Angelia Mould, MD;  Location: [place];  Service: Vascular;  Laterality: Left;   Allergies  Allergen Reactions  . Morphine And Related Other (See Comments)    Not effective        Objective:   Physical Exam There were no vitals filed for this visit. Filed Vitals:   04/07/13 1457  BP: 134/56  Pulse: 104  Temp: 99.2 F (37.3 C)  Height: 6\' 4"  (1.93 m)  Weight: 249 lb 9.6 oz (113.218 kg)   Alert and oriented. Skin warm and dry. Oral mucosa is moist.   . Sclera anicteric, conjunctivae is pink. Thyroid not enlarged. No cervical lymphadenopathy. Lungs clear. Heart regular rate and rhythm.  Abdomen is soft. Bowel sounds are positive. No hepatomegaly. No abdominal masses felt. No tenderness.  No edema to lower extremities.     Assessment:    Anemia. Guaiac positive  stool. Recent colonoscopy last year with multiple polyps. Dysphagia to solids. Hx of erosive esophagitis in the past (2012). Dr. Laural Golden in with patient also.    Plan:     EGD/ED. If normal Given's capsule.

## 2013-04-08 ENCOUNTER — Encounter: Payer: Self-pay | Admitting: Vascular Surgery

## 2013-04-09 ENCOUNTER — Encounter: Payer: Self-pay | Admitting: Vascular Surgery

## 2013-04-09 ENCOUNTER — Ambulatory Visit (INDEPENDENT_AMBULATORY_CARE_PROVIDER_SITE_OTHER): Payer: Medicare Other | Admitting: Vascular Surgery

## 2013-04-09 ENCOUNTER — Encounter (INDEPENDENT_AMBULATORY_CARE_PROVIDER_SITE_OTHER): Payer: Medicare Other | Admitting: *Deleted

## 2013-04-09 VITALS — BP 131/88 | HR 96 | Temp 97.0°F | Resp 16 | Ht 76.0 in | Wt 244.0 lb

## 2013-04-09 DIAGNOSIS — T82898A Other specified complication of vascular prosthetic devices, implants and grafts, initial encounter: Secondary | ICD-10-CM

## 2013-04-09 DIAGNOSIS — M79609 Pain in unspecified limb: Secondary | ICD-10-CM | POA: Insufficient documentation

## 2013-04-09 DIAGNOSIS — N186 End stage renal disease: Secondary | ICD-10-CM

## 2013-04-09 DIAGNOSIS — T82598A Other mechanical complication of other cardiac and vascular devices and implants, initial encounter: Secondary | ICD-10-CM

## 2013-04-09 DIAGNOSIS — Z4931 Encounter for adequacy testing for hemodialysis: Secondary | ICD-10-CM

## 2013-04-09 NOTE — Progress Notes (Signed)
Patient is a 62 year old male who recently had an infiltration of his left upper arm AV fistula. It is still swollen and painful to him. He still has a catheter in as his fistula was only placed on 12/18/2012. He denies any numbness or tingling in his hand. He states that the swelling has not been progressive. He does have some bruising around the fistula.  Review of systems: He denies shortness of breath. He denies chest pain.  Physical exam: Filed Vitals:   04/09/13 1338  BP: 131/88  Pulse: 96  Temp: 97 F (36.1 C)  TempSrc: Oral  Resp: 16  Height: 6\' 4"  (1.93 m)  Weight: 244 lb (110.678 kg)  SpO2: 99%    Left upper extremity: Audible bruit palpable thrill in fistula with ecchymosis and some surrounding hematoma. Non-expansile. Neck: Right-sided catheter no surrounding erythema  Data: Duplex ultrasound AV fistula was performed today. This shows a widely patent fistula 6-10 mm in diameter. There are 2 side branches mid fistula both approximately 4 mm.  Assessment: Patent left arm AV fistula with recent infiltration.  Plan: The fistula needs to rest for at least one month. He will continue to use his catheter in the meanwhile. If they're having difficulty sticking the fistula that point will consider a fistulogram.  [name], MD [facility] [place]: [phone] Pager: [phone]

## 2013-04-16 ENCOUNTER — Encounter (HOSPITAL_COMMUNITY): Payer: Self-pay | Admitting: Pharmacy Technician

## 2013-04-23 ENCOUNTER — Encounter (HOSPITAL_COMMUNITY)
Admission: RE | Disposition: A | Discharge status: Home / Self Care | Payer: Self-pay | Source: Ambulatory Visit | Attending: Internal Medicine

## 2013-04-23 ENCOUNTER — Other Ambulatory Visit (INDEPENDENT_AMBULATORY_CARE_PROVIDER_SITE_OTHER): Payer: Self-pay | Admitting: Internal Medicine

## 2013-04-23 ENCOUNTER — Encounter (HOSPITAL_COMMUNITY): Payer: Self-pay | Admitting: *Deleted

## 2013-04-23 ENCOUNTER — Ambulatory Visit (HOSPITAL_COMMUNITY)
Admission: RE | Admit: 2013-04-23 | Discharge: 2013-04-23 | Disposition: A | Discharge status: Home / Self Care | Payer: 59 | Source: Ambulatory Visit | Attending: Internal Medicine | Admitting: Internal Medicine

## 2013-04-23 DIAGNOSIS — D509 Iron deficiency anemia, unspecified: Secondary | ICD-10-CM | POA: Insufficient documentation

## 2013-04-23 DIAGNOSIS — K2980 Duodenitis without bleeding: Secondary | ICD-10-CM

## 2013-04-23 DIAGNOSIS — Z992 Dependence on renal dialysis: Secondary | ICD-10-CM | POA: Insufficient documentation

## 2013-04-23 DIAGNOSIS — J4489 Other specified chronic obstructive pulmonary disease: Secondary | ICD-10-CM | POA: Insufficient documentation

## 2013-04-23 DIAGNOSIS — R195 Other fecal abnormalities: Secondary | ICD-10-CM

## 2013-04-23 DIAGNOSIS — D649 Anemia, unspecified: Secondary | ICD-10-CM

## 2013-04-23 DIAGNOSIS — J449 Chronic obstructive pulmonary disease, unspecified: Secondary | ICD-10-CM | POA: Insufficient documentation

## 2013-04-23 DIAGNOSIS — IMO0001 Reserved for inherently not codable concepts without codable children: Secondary | ICD-10-CM

## 2013-04-23 DIAGNOSIS — N186 End stage renal disease: Secondary | ICD-10-CM | POA: Insufficient documentation

## 2013-04-23 DIAGNOSIS — R1310 Dysphagia, unspecified: Secondary | ICD-10-CM | POA: Insufficient documentation

## 2013-04-23 DIAGNOSIS — K319 Disease of stomach and duodenum, unspecified: Secondary | ICD-10-CM

## 2013-04-23 HISTORY — PX: ESOPHAGOGASTRODUODENOSCOPY (EGD) WITH ESOPHAGEAL DILATION: SHX5812

## 2013-04-23 SURGERY — ESOPHAGOGASTRODUODENOSCOPY (EGD) WITH ESOPHAGEAL DILATION
Anesthesia: Moderate Sedation

## 2013-04-23 MED ORDER — SODIUM CHLORIDE 0.9 % IV SOLN
INTRAVENOUS | Status: DC
  Administered 2013-04-23: 09:00:00 via INTRAVENOUS

## 2013-04-23 MED ORDER — MEPERIDINE HCL 25 MG/ML IJ SOLN
INTRAMUSCULAR | Status: DC | PRN
  Administered 2013-04-23 (×2): 25 mg via INTRAVENOUS

## 2013-04-23 MED ORDER — MIDAZOLAM HCL 5 MG/5ML IJ SOLN
INTRAMUSCULAR | Status: AC
  Filled 2013-04-23: qty 10

## 2013-04-23 MED ORDER — PROMETHAZINE HCL 25 MG/ML IJ SOLN
INTRAMUSCULAR | Status: AC
  Filled 2013-04-23: qty 1

## 2013-04-23 MED ORDER — STERILE WATER FOR IRRIGATION IR SOLN
Status: DC | PRN
  Administered 2013-04-23: 09:00:00

## 2013-04-23 MED ORDER — MEPERIDINE HCL 50 MG/ML IJ SOLN
INTRAMUSCULAR | Status: AC
  Filled 2013-04-23: qty 1

## 2013-04-23 MED ORDER — MIDAZOLAM HCL 5 MG/5ML IJ SOLN
INTRAMUSCULAR | Status: DC | PRN
  Administered 2013-04-23: 2 mg via INTRAVENOUS
  Administered 2013-04-23 (×2): 3 mg via INTRAVENOUS
  Administered 2013-04-23: 2 mg via INTRAVENOUS

## 2013-04-23 MED ORDER — SODIUM CHLORIDE 0.9 % IJ SOLN
INTRAMUSCULAR | Status: AC
  Filled 2013-04-23: qty 10

## 2013-04-23 MED ORDER — PROMETHAZINE HCL 25 MG/ML IJ SOLN
INTRAMUSCULAR | Status: DC | PRN
  Administered 2013-04-23 (×2): 12.5 mg via INTRAVENOUS

## 2013-04-23 NOTE — H&P (Addendum)
[name] is an 62 y.o. male.   Chief Complaint: Patient is here for EGD. HPI: Patient is 62 year old Caucasian male with end-stage liver disease who is awaiting renal transplant has developed anemia with pressure drop in H&H. He is receiving iron infusion weekly with dialysis for iron deficiency. Stool was noted to be heme-positive. He noted his stools to be dark few weeks ago when he was constipated. He denies melena rectal bleeding nausea vomiting or hematemesis. He has heartburn only when he eats foods that he should not. He has intermittent regurgitation. He some solid foods go down slowly. He has not had an episode of food impaction.  Past Medical History  Diagnosis Date  . Anemia   . Chronic kidney disease   . Anxiety   . Depression   . COPD (chronic obstructive pulmonary disease)     Pt denies  . Pneumonia     Hx: of  . GERD (gastroesophageal reflux disease)   . H/O hiatal hernia   . Headache(784.0)     Hx: Migraines  . Arthritis   . Numbness of toes     toes and feet    Past Surgical History  Procedure Laterality Date  . Inguinal hernia repair      ,  times   2  . Tonsillectomy    . Av fistula placement  2012       left arm   . Colonoscopy  10/26/2011    Procedure: COLONOSCOPY;  Surgeon: Rogene Houston, MD;  [facility];  Service: Endoscopy;  Laterality: N/A;  730  . Knee arthroscopy  2011    Right Knee  . Thrombectomy w/ embolectomy Left 12/11/2012    Procedure: THROMBECTOMY ARTERIOVENOUS FISTULA;  Surgeon: Serafina Mitchell, MD;  Location: [place];  Service: Vascular;  Laterality: Left;  . Av fistula placement Left 12/18/2012    Procedure: ARTERIOVENOUS (AV) FISTULA CREATION;  Surgeon: Angelia Mould, MD;  Location: [place];  Service: Vascular;  Laterality: Left;  . Insertion of dialysis catheter Left 12/18/2012    Procedure: INSERTION OF DIALYSIS CATHETER;  Surgeon: Angelia Mould, MD;  Location: [facility] OR;  Service: Vascular;  Laterality: Left;     Family History  Problem Relation Age of Onset  . Heart disease Father     Heart Disease before age 60   Social History:  reports that he quit smoking about 13 months ago. His smoking use included Cigars. He has never used smokeless tobacco. He reports that he does not drink alcohol or use illicit drugs.  Allergies:  Allergies  Allergen Reactions  . Morphine And Related Other (See Comments)    Not effective    Medications Prior to Admission  Medication Sig Dispense Refill  . acetaminophen (TYLENOL) 650 MG CR tablet Take 650 mg by mouth 4 (four) times daily.      . B Complex-C-Zn-Folic Acid (DIALYVITE/ZINC PO) Take 1 tablet by mouth daily.      . cholecalciferol (VITAMIN D) 1000 UNITS tablet Take 1,000 Units by mouth daily.      . cinacalcet (SENSIPAR) 30 MG tablet Take 30 mg by mouth daily.      . cyclobenzaprine (FLEXERIL) 10 MG tablet Take 10 mg by mouth 3 (three) times daily as needed for muscle spasms.      . diazepam (VALIUM) 10 MG tablet Take 10 mg by mouth 3 (three) times daily.      . diphenhydrAMINE (BENADRYL) 25 mg capsule Take 25 mg  by mouth every 6 (six) hours as needed for itching.      . docusate sodium (COLACE) 100 MG capsule Take 100 mg by mouth 2 (two) times daily as needed for constipation.       Marland Kitchen FLUoxetine (PROZAC) 20 MG capsule Take 20 mg by mouth daily.      Marland Kitchen gabapentin (NEURONTIN) 100 MG capsule Take 100 mg by mouth at bedtime.      . Iron Sucrose (VENOFER IV) Inject into the vein. 50mg  once a week      . oxyCODONE (OXY IR/ROXICODONE) 5 MG immediate release tablet Take 2 tablets (10 mg total) by mouth 4 (four) times daily.  30 tablet  0  . polyethylene glycol (MIRALAX / GLYCOLAX) packet Take 17 g by mouth daily.      . Sevelamer Carbonate (RENVELA PO) Take by mouth. 4 tabs daily three times a day and 2 tabs daily with snacks.      . simvastatin (ZOCOR) 40 MG tablet Take 40 mg by mouth every evening.      Marland Kitchen lanthanum (FOSRENOL) 1000 MG chewable tablet Chew  1,000 mg by mouth 2 (two) times daily with a meal.       . Pseudoeph-Doxylamine-DM-APAP (NYQUIL PO) Take 30 mLs by mouth at bedtime as needed (for sleep).        No results found for this or any previous visit (from the past 48 hour(s)). No results found.  ROS  Blood pressure 134/72, pulse 79, temperature 97.5 F (36.4 C), temperature source Oral, resp. rate 12, height 6\' 4"  (1.93 m), weight 250 lb (113.399 kg), SpO2 100.00%. Physical Exam  Constitutional: He appears well-developed and well-nourished.  HENT:  Mouth/Throat: Oropharynx is clear and moist.  Eyes: Conjunctivae are normal.  Neck: No thyromegaly present.  Cardiovascular: Normal rate, regular rhythm and normal heart sounds.   No murmur heard. Respiratory: Effort normal and breath sounds normal.  Right subclavian catheter for hemodialysis.  GI: Soft. He exhibits no distension and no mass. There is no tenderness.  Musculoskeletal: He exhibits no edema.  Lymphadenopathy:    He has no cervical adenopathy.  Skin: Skin is warm and dry.     Assessment/Plan Heme-positive stools. History of iron deficiency anemia. Patient had colonoscopy in March last year. Diagnostic EGD and possible ED.  Ziyanna Tolin U 04/23/2013, 9:25 AM

## 2013-04-23 NOTE — Op Note (Signed)
EGD PROCEDURE REPORT  PATIENT:  [name]  MR#:  [id number] Birthdate:  07-Feb-1951, 62 y.o., male Endoscopist:  Dr. Rogene Houston, MD Referred By:  Dr. Dimas Chyle Date: 04/23/2013  Procedure:   EGD with ED.  Indications:  Patient is 62 year old Caucasian male with multiple medical problems who has developed iron deficiency anemia. He is receiving iron infusion with hemodialysis once a week. He also has heme positive stools. He did undergo colonoscopy in March 2013. He is undergoing EGD for diagnostic and therapeutic purposes. He also complains of intermittent solid food dysphagia.            Informed Consent:  The risks, benefits, alternatives & imponderables which include, but are not limited to, bleeding, infection, perforation, drug reaction and potential missed lesion have been reviewed.  The potential for biopsy, lesion removal, esophageal dilation, etc. have also been discussed.  Questions have been answered.  All parties agreeable.  Please see history & physical in medical record for more information.  Medications:  Demerol 50 mg IV Versed 10mg  IV Phenergan 25 mg IV and diluted form. Cetacaine spray topically for oropharyngeal anesthesia  Description of procedure:  The endoscope was introduced through the mouth and advanced to the second portion of the duodenum without difficulty or limitations. The mucosal surfaces were surveyed very carefully during advancement of the scope and upon withdrawal.  Findings:  Esophagus:  Mucosa of the esophagus was normal. There was no obvious ring or stricture. GE junction was unremarkable. GEJ:  41 cm Stomach:  There was a moderate amount of food debris primarily at gastric body and antrum. Pyloric channel was patent. Part of the mucosa that was seen at gastric body and antrum was normal. Mucosa and fundus and cardia was unremarkable. Duodenum:  Focal bulbar edema and erythema noted. No erosions ulcers or AV malformations present. Small  amount of food debris was noted in the bulb. Post bulbar mucosa was normal.  Therapeutic/Diagnostic Maneuvers Performed:   Esophagus was dilated by passing 56 [place] Maloney dilator performed insertion. Esophageal mucosa was reexamined post dilation and no disruption noted.  Complications:  None  Impression: No evidence of peptic ulcer disease or AV malformations. Moderate amount of food debris in stomach with patent pylorus suggesting gastroparesis. Focal bulbar duodenitis. Please note H. pylori serology was negative in April 2012. Esophagus dilated by passing 28 [place] Maloney dilator given history of dysphagia.  Recommendations:  Solid phase gastric emptying study. Hemoccults x3. If Hemoccults positive will consider small bowel given capsule study.  Kinzi Frediani U  04/23/2013  10:03 AM  CC: Dr. Gar Ponto, MD & Dr. Rayne Du ref. provider found CC Dr. Peri Maris, MD

## 2013-04-24 ENCOUNTER — Encounter (HOSPITAL_COMMUNITY): Payer: Self-pay | Admitting: Internal Medicine

## 2013-04-30 ENCOUNTER — Encounter (HOSPITAL_COMMUNITY)
Admission: RE | Admit: 2013-04-30 | Discharge: 2013-04-30 | Disposition: A | Discharge status: Home / Self Care | Payer: 59 | Source: Ambulatory Visit | Attending: Internal Medicine | Admitting: Internal Medicine

## 2013-04-30 DIAGNOSIS — IMO0001 Reserved for inherently not codable concepts without codable children: Secondary | ICD-10-CM

## 2013-04-30 DIAGNOSIS — K319 Disease of stomach and duodenum, unspecified: Secondary | ICD-10-CM

## 2013-05-05 ENCOUNTER — Encounter (HOSPITAL_COMMUNITY): Payer: Self-pay

## 2013-05-05 ENCOUNTER — Encounter (HOSPITAL_COMMUNITY)
Admission: RE | Admit: 2013-05-05 | Discharge: 2013-05-05 | Disposition: A | Discharge status: Home / Self Care | Payer: 59 | Source: Ambulatory Visit | Attending: Internal Medicine | Admitting: Internal Medicine

## 2013-05-05 DIAGNOSIS — R933 Abnormal findings on diagnostic imaging of other parts of digestive tract: Secondary | ICD-10-CM | POA: Insufficient documentation

## 2013-05-05 DIAGNOSIS — R1110 Vomiting, unspecified: Secondary | ICD-10-CM | POA: Insufficient documentation

## 2013-05-05 HISTORY — DX: Disorder of kidney and ureter, unspecified: N28.9

## 2013-05-05 MED ORDER — TECHNETIUM TC 99M SULFUR COLLOID
2.0000 | Freq: Once | INTRAVENOUS | Status: AC | PRN
  Administered 2013-05-05: 2 via ORAL

## 2013-05-14 NOTE — Progress Notes (Signed)
Apt has been scheduled for 08/04/13 with Dr. Laural Golden.

## 2013-05-21 DIAGNOSIS — J45901 Unspecified asthma with (acute) exacerbation: Secondary | ICD-10-CM | POA: Insufficient documentation

## 2013-06-01 ENCOUNTER — Other Ambulatory Visit: Payer: Self-pay | Admitting: *Deleted

## 2013-06-02 DIAGNOSIS — M545 Low back pain, unspecified: Secondary | ICD-10-CM | POA: Insufficient documentation

## 2013-06-03 ENCOUNTER — Encounter (HOSPITAL_COMMUNITY)
Admission: RE | Disposition: A | Discharge status: Home / Self Care | Payer: Self-pay | Source: Ambulatory Visit | Attending: Surgery

## 2013-06-03 ENCOUNTER — Ambulatory Visit (HOSPITAL_COMMUNITY)
Admission: RE | Admit: 2013-06-03 | Discharge: 2013-06-03 | Disposition: A | Discharge status: Home / Self Care | Payer: 59 | Source: Ambulatory Visit | Attending: Surgery | Admitting: Surgery

## 2013-06-03 DIAGNOSIS — T82898A Other specified complication of vascular prosthetic devices, implants and grafts, initial encounter: Secondary | ICD-10-CM

## 2013-06-03 DIAGNOSIS — F3289 Other specified depressive episodes: Secondary | ICD-10-CM | POA: Insufficient documentation

## 2013-06-03 DIAGNOSIS — J449 Chronic obstructive pulmonary disease, unspecified: Secondary | ICD-10-CM | POA: Insufficient documentation

## 2013-06-03 DIAGNOSIS — K219 Gastro-esophageal reflux disease without esophagitis: Secondary | ICD-10-CM | POA: Insufficient documentation

## 2013-06-03 DIAGNOSIS — Z79899 Other long term (current) drug therapy: Secondary | ICD-10-CM | POA: Insufficient documentation

## 2013-06-03 DIAGNOSIS — F411 Generalized anxiety disorder: Secondary | ICD-10-CM | POA: Insufficient documentation

## 2013-06-03 DIAGNOSIS — Z992 Dependence on renal dialysis: Secondary | ICD-10-CM | POA: Insufficient documentation

## 2013-06-03 DIAGNOSIS — J4489 Other specified chronic obstructive pulmonary disease: Secondary | ICD-10-CM | POA: Insufficient documentation

## 2013-06-03 DIAGNOSIS — Y832 Surgical operation with anastomosis, bypass or graft as the cause of abnormal reaction of the patient, or of later complication, without mention of misadventure at the time of the procedure: Secondary | ICD-10-CM | POA: Insufficient documentation

## 2013-06-03 DIAGNOSIS — Z87891 Personal history of nicotine dependence: Secondary | ICD-10-CM | POA: Insufficient documentation

## 2013-06-03 DIAGNOSIS — M129 Arthropathy, unspecified: Secondary | ICD-10-CM | POA: Insufficient documentation

## 2013-06-03 DIAGNOSIS — F329 Major depressive disorder, single episode, unspecified: Secondary | ICD-10-CM | POA: Insufficient documentation

## 2013-06-03 DIAGNOSIS — E119 Type 2 diabetes mellitus without complications: Secondary | ICD-10-CM | POA: Insufficient documentation

## 2013-06-03 DIAGNOSIS — I120 Hypertensive chronic kidney disease with stage 5 chronic kidney disease or end stage renal disease: Secondary | ICD-10-CM | POA: Insufficient documentation

## 2013-06-03 DIAGNOSIS — N186 End stage renal disease: Secondary | ICD-10-CM | POA: Insufficient documentation

## 2013-06-03 HISTORY — PX: SHUNTOGRAM: SHX5491

## 2013-06-03 LAB — POCT I-STAT, CHEM 8
BUN: 80 mg/dL — ABNORMAL HIGH (ref 6–23)
Chloride: 97 mEq/L (ref 96–112)
Glucose, Bld: 147 mg/dL — ABNORMAL HIGH (ref 70–99)
HCT: 29 % — ABNORMAL LOW (ref 39.0–52.0)
Hemoglobin: 9.9 g/dL — ABNORMAL LOW (ref 13.0–17.0)
Potassium: 4.5 mEq/L (ref 3.5–5.1)
Sodium: 136 mEq/L (ref 135–145)

## 2013-06-03 SURGERY — ASSESSMENT, SHUNT FUNCTION, WITH CONTRAST RADIOGRAPHIC STUDY
Anesthesia: LOCAL | Laterality: Left

## 2013-06-03 MED ORDER — FENTANYL CITRATE 0.05 MG/ML IJ SOLN
INTRAMUSCULAR | Status: AC
  Filled 2013-06-03: qty 2

## 2013-06-03 MED ORDER — METOPROLOL TARTRATE 1 MG/ML IV SOLN: 2.0000 mg | INTRAVENOUS | Status: DC | PRN

## 2013-06-03 MED ORDER — PHENOL 1.4 % MT LIQD: 1.0000 | OROMUCOSAL | Status: DC | PRN

## 2013-06-03 MED ORDER — ACETAMINOPHEN 325 MG PO TABS
325.0000 mg | ORAL_TABLET | ORAL | Status: DC | PRN
  Administered 2013-06-03: 650 mg via ORAL
  Filled 2013-06-03 (×2): qty 2

## 2013-06-03 MED ORDER — LIDOCAINE HCL (PF) 1 % IJ SOLN
INTRAMUSCULAR | Status: AC
  Filled 2013-06-03: qty 30

## 2013-06-03 MED ORDER — MIDAZOLAM HCL 2 MG/2ML IJ SOLN
INTRAMUSCULAR | Status: AC
  Filled 2013-06-03: qty 2

## 2013-06-03 MED ORDER — LABETALOL HCL 5 MG/ML IV SOLN: 10.0000 mg | INTRAVENOUS | Status: DC | PRN

## 2013-06-03 MED ORDER — HYDRALAZINE HCL 20 MG/ML IJ SOLN: 10.0000 mg | INTRAMUSCULAR | Status: DC | PRN

## 2013-06-03 MED ORDER — ONDANSETRON HCL 4 MG/2ML IJ SOLN: 4.0000 mg | Freq: Four times a day (QID) | INTRAMUSCULAR | Status: DC | PRN

## 2013-06-03 MED ORDER — SODIUM CHLORIDE 0.9 % IJ SOLN: 3.0000 mL | INTRAMUSCULAR | Status: DC | PRN

## 2013-06-03 MED ORDER — HEPARIN (PORCINE) IN NACL 2-0.9 UNIT/ML-% IJ SOLN
INTRAMUSCULAR | Status: AC
  Filled 2013-06-03: qty 500

## 2013-06-03 MED ORDER — GUAIFENESIN-DM 100-10 MG/5ML PO SYRP: 15.0000 mL | ORAL_SOLUTION | ORAL | Status: DC | PRN

## 2013-06-03 MED ORDER — ALUM & MAG HYDROXIDE-SIMETH 200-200-20 MG/5ML PO SUSP: 15.0000 mL | ORAL | Status: DC | PRN

## 2013-06-03 MED ORDER — ACETAMINOPHEN 325 MG RE SUPP
325.0000 mg | RECTAL | Status: DC | PRN
  Filled 2013-06-03: qty 2

## 2013-06-03 NOTE — Op Note (Signed)
[facility] [place]  Patient name: [name] MRN: [id number] DOB: 08-27-1950 Sex: male  06/03/2013 Pre-operative Diagnosis: Poorly functioning left upper arm fistula Post-operative diagnosis:  Same Surgeon:  Eldridge Abrahams Procedure Performed:  1.  ultrasound-guided access, left cephalic vein  2.  fistulogram   Indications:  The patient has been pulling clots with dialysis  Procedure:  The patient was identified in the holding area and taken to room 8.  The patient was then placed supine on the table and prepped and draped in the usual sterile fashion.  A time out was called.  Ultrasound was used to evaluate the fistula.  The vein was patent and compressible.  A digital ultrasound image was acquired.  The fistula was then accessed under ultrasound guidance using a micropuncture needle.  An 018 wire was then asvanced without resistance and a micropuncture sheath was placed.  Contrast injections were then performed through the sheath.  Findings:  The cephalic vein is widely patent in the upper arm.  There is no evidence of central venous stenosis.  The arterial venous anastomosis is widely patent.  There is one large branch in the mid arm.   Intervention:   none  Impression:  #1  widely patent left brachiocephalic fistula with no evidence of arterial venous anastomotic stenosis, and no evidence of central venous stenosis  #2  there is one large branch was in the midportion of the fistula  #3  by ultrasound, the fistula is greater than 1.5 cm deep.  The patient will be scheduled for elevation of the fistula and branch ligation next week   V. Annamarie Major, M.D. [facility] [place] Office: [phone] Pager:  [phone]

## 2013-06-03 NOTE — H&P (Signed)
[facility] [place]   Patient name: [name] MRN: [id number] DOB: 1951/07/16 Sex: male    No chief complaint on file.   HISTORY OF PRESENT ILLNESS: 62 yo with left brachiocephalic fistula created by CSD in May of 2014.  HD access  is pulling clots and has infiltrated.  HD cath in place.  Past Medical History  Diagnosis Date  . Anemia   . Chronic kidney disease   . Anxiety   . Depression   . COPD (chronic obstructive pulmonary disease)     Pt denies  . Pneumonia     Hx: of  . GERD (gastroesophageal reflux disease)   . H/O hiatal hernia   . Headache(784.0)     Hx: Migraines  . Arthritis   . Numbness of toes     toes and feet  . Diabetes mellitus without complication   . Hypertension   . Renal insufficiency     Past Surgical History  Procedure Laterality Date  . Inguinal hernia repair      ,  times   2  . Tonsillectomy    . Av fistula placement  2012       left arm   . Colonoscopy  10/26/2011    Procedure: COLONOSCOPY;  Surgeon: Rogene Houston, MD;  [facility];  Service: Endoscopy;  Laterality: N/A;  730  . Knee arthroscopy  2011    Right Knee  . Thrombectomy w/ embolectomy Left 12/11/2012    Procedure: THROMBECTOMY ARTERIOVENOUS FISTULA;  Surgeon: Serafina Mitchell, MD;  Location: [facility];  Service: Vascular;  Laterality: Left;  . Av fistula placement Left 12/18/2012    Procedure: ARTERIOVENOUS (AV) FISTULA CREATION;  Surgeon: Angelia Mould, MD;  Location: [place];  Service: Vascular;  Laterality: Left;  . Insertion of dialysis catheter Left 12/18/2012    Procedure: INSERTION OF DIALYSIS CATHETER;  Surgeon: Angelia Mould, MD;  Location: [facility] OR;  Service: Vascular;  Laterality: Left;  . Esophagogastroduodenoscopy (egd) with esophageal dilation N/A 04/23/2013    Procedure: ESOPHAGOGASTRODUODENOSCOPY (EGD) WITH ESOPHAGEAL DILATION;  Surgeon: Rogene Houston, MD;  [facility];  Service: Endoscopy;  Laterality: N/A;   200-moved to 930     History   Social History  . Marital Status: Married    Spouse Name: N/A    Number of Children: N/A  . Years of Education: N/A   Occupational History  . Not on file.   Social History Main Topics  . Smoking status: Former Smoker -- 10 years    Types: Cigars    Quit date: 03/13/2012  . Smokeless tobacco: Never Used  . Alcohol Use: No  . Drug Use: No  . Sexual Activity: No   Other Topics Concern  . Not on file   Social History Narrative  . No narrative on file    Family History  Problem Relation Age of Onset  . Heart disease Father     Heart Disease before age 56    Allergies as of 06/01/2013 - Review Complete 05/05/2013  Allergen Reaction Noted  . Morphine and related Other (See Comments) 10/24/2011    No current facility-administered medications on file prior to encounter.   Current Outpatient Prescriptions on File Prior to Encounter  Medication Sig Dispense Refill  . B Complex-C-Zn-Folic Acid (DIALYVITE/ZINC PO) Take 1 tablet by mouth daily.      . cinacalcet (SENSIPAR) 30 MG tablet Take 30 mg by mouth daily.      Marland Kitchen  cyclobenzaprine (FLEXERIL) 10 MG tablet Take 10 mg by mouth 3 (three) times daily as needed for muscle spasms.      . diazepam (VALIUM) 10 MG tablet Take 10 mg by mouth 3 (three) times daily.      . diphenhydrAMINE (BENADRYL) 25 mg capsule Take 25 mg by mouth every 6 (six) hours as needed for itching.      Marland Kitchen FLUoxetine (PROZAC) 20 MG capsule Take 20 mg by mouth daily.      Marland Kitchen gabapentin (NEURONTIN) 100 MG capsule Take 100 mg by mouth at bedtime.      . Iron Sucrose (VENOFER IV) Inject into the vein. 50mg  once a week on Wednesday.      Marland Kitchen lanthanum (FOSRENOL) 1000 MG chewable tablet Chew 1,000 mg by mouth 2 (two) times daily with a meal.       . oxyCODONE (OXY IR/ROXICODONE) 5 MG immediate release tablet Take 2 tablets (10 mg total) by mouth 4 (four) times daily.  30 tablet  0  . polyethylene glycol (MIRALAX / GLYCOLAX) packet Take  17 g by mouth daily as needed (constipation).       . Sevelamer Carbonate (RENVELA PO) Take 2-4 tablets by mouth See admin instructions. 3-4 tabs daily three times daily with meals (depending on what he eats) and 2 tabs daily with snacks.      . simvastatin (ZOCOR) 40 MG tablet Take 40 mg by mouth every evening.         REVIEW OF SYSTEMS: Cardiovascular: No chest pain, chest pressure, palpitations Pulmonary: No productive cough, asthma or wheezing. Neurologic: No weakness, paresthesias, aphasia, or amaurosis. No dizziness. Hematologic: No bleeding problems or clotting disorders. Musculoskeletal: No joint pain or joint swelling. Gastrointestinal: No blood in stool or hematemesis Genitourinary: No dysuria or hematuria. Psychiatric:: No history of major depression. Integumentary: No rashes or ulcers. Constitutional: No fever or chills.  PHYSICAL EXAMINATION:   Vital signs are BP 121/53  Pulse 77  Temp(Src) 98.5 F (36.9 C) (Oral)  Resp 20  Ht 6\' 4"  (1.93 m)  Wt 251 lb (113.853 kg)  BMI 30.57 kg/m2  SpO2 99% General: The patient appears their stated age. HEENT:  No gross abnormalities Pulmonary:  Non labored breathing Abdomen: Soft and non-tender Musculoskeletal: There are no major deformities. Neurologic: No focal weakness or paresthesias are detected, Skin: There are no ulcer or rashes noted. Psychiatric: The patient has normal affect. Cardiovascular: left upper arm access with ecchymosis   Assessment: Poorly functioning access Plan: fistulogram possible intervention  V. Leia Alf, M.D. [facility] [place] Office: [phone] Pager:  [phone]

## 2013-06-04 ENCOUNTER — Encounter: Payer: Self-pay | Admitting: *Deleted

## 2013-06-04 ENCOUNTER — Other Ambulatory Visit: Payer: Self-pay | Admitting: *Deleted

## 2013-06-08 ENCOUNTER — Encounter (HOSPITAL_COMMUNITY): Payer: Self-pay | Admitting: Pharmacy Technician

## 2013-06-17 ENCOUNTER — Encounter (HOSPITAL_COMMUNITY): Payer: Self-pay | Admitting: *Deleted

## 2013-06-17 ENCOUNTER — Other Ambulatory Visit (HOSPITAL_COMMUNITY): Payer: Self-pay | Admitting: *Deleted

## 2013-06-17 MED ORDER — DEXTROSE 5 % IV SOLN
1.5000 g | INTRAVENOUS | Status: AC
  Administered 2013-06-18: 1.5 g via INTRAVENOUS
  Filled 2013-06-17: qty 1.5

## 2013-06-18 ENCOUNTER — Telehealth: Payer: Self-pay | Admitting: Surgery

## 2013-06-18 ENCOUNTER — Encounter (HOSPITAL_COMMUNITY): Payer: Self-pay | Admitting: *Deleted

## 2013-06-18 ENCOUNTER — Ambulatory Visit (HOSPITAL_COMMUNITY): Payer: 59 | Admitting: Anesthesiology

## 2013-06-18 ENCOUNTER — Ambulatory Visit (HOSPITAL_COMMUNITY)
Admission: RE | Admit: 2013-06-18 | Discharge: 2013-06-18 | Disposition: A | Discharge status: Home / Self Care | Payer: 59 | Source: Ambulatory Visit | Attending: Surgery | Admitting: Surgery

## 2013-06-18 ENCOUNTER — Encounter (HOSPITAL_COMMUNITY)
Admission: RE | Disposition: A | Discharge status: Home / Self Care | Payer: Self-pay | Source: Ambulatory Visit | Attending: Surgery

## 2013-06-18 ENCOUNTER — Encounter (HOSPITAL_COMMUNITY): Payer: 59 | Admitting: Anesthesiology

## 2013-06-18 DIAGNOSIS — K219 Gastro-esophageal reflux disease without esophagitis: Secondary | ICD-10-CM | POA: Insufficient documentation

## 2013-06-18 DIAGNOSIS — T82898A Other specified complication of vascular prosthetic devices, implants and grafts, initial encounter: Secondary | ICD-10-CM | POA: Insufficient documentation

## 2013-06-18 DIAGNOSIS — F411 Generalized anxiety disorder: Secondary | ICD-10-CM | POA: Insufficient documentation

## 2013-06-18 DIAGNOSIS — F329 Major depressive disorder, single episode, unspecified: Secondary | ICD-10-CM | POA: Insufficient documentation

## 2013-06-18 DIAGNOSIS — J4489 Other specified chronic obstructive pulmonary disease: Secondary | ICD-10-CM | POA: Insufficient documentation

## 2013-06-18 DIAGNOSIS — E119 Type 2 diabetes mellitus without complications: Secondary | ICD-10-CM | POA: Insufficient documentation

## 2013-06-18 DIAGNOSIS — Y832 Surgical operation with anastomosis, bypass or graft as the cause of abnormal reaction of the patient, or of later complication, without mention of misadventure at the time of the procedure: Secondary | ICD-10-CM | POA: Insufficient documentation

## 2013-06-18 DIAGNOSIS — D649 Anemia, unspecified: Secondary | ICD-10-CM | POA: Insufficient documentation

## 2013-06-18 DIAGNOSIS — F3289 Other specified depressive episodes: Secondary | ICD-10-CM | POA: Insufficient documentation

## 2013-06-18 DIAGNOSIS — N186 End stage renal disease: Secondary | ICD-10-CM | POA: Insufficient documentation

## 2013-06-18 DIAGNOSIS — J449 Chronic obstructive pulmonary disease, unspecified: Secondary | ICD-10-CM | POA: Insufficient documentation

## 2013-06-18 HISTORY — PX: FISTULA SUPERFICIALIZATION: SHX6341

## 2013-06-18 LAB — GLUCOSE, CAPILLARY: Glucose-Capillary: 144 mg/dL — ABNORMAL HIGH (ref 70–99)

## 2013-06-18 LAB — POCT I-STAT 4, (NA,K, GLUC, HGB,HCT)
Glucose, Bld: 159 mg/dL — ABNORMAL HIGH (ref 70–99)
HCT: 33 % — ABNORMAL LOW (ref 39.0–52.0)
Hemoglobin: 11.2 g/dL — ABNORMAL LOW (ref 13.0–17.0)
Potassium: 3.9 mEq/L (ref 3.5–5.1)
Sodium: 139 mEq/L (ref 135–145)

## 2013-06-18 SURGERY — FISTULA SUPERFICIALIZATION
Anesthesia: General | Site: Arm Upper | Laterality: Left | Wound class: Clean

## 2013-06-18 MED ORDER — ONDANSETRON HCL 4 MG/2ML IJ SOLN
INTRAMUSCULAR | Status: DC | PRN
  Administered 2013-06-18: 4 mg via INTRAVENOUS

## 2013-06-18 MED ORDER — OXYCODONE HCL 5 MG/5ML PO SOLN: 5.0000 mg | Freq: Once | ORAL | Status: DC | PRN

## 2013-06-18 MED ORDER — LIDOCAINE HCL (CARDIAC) 20 MG/ML IV SOLN
INTRAVENOUS | Status: DC | PRN
  Administered 2013-06-18: 70 mg via INTRAVENOUS

## 2013-06-18 MED ORDER — OXYCODONE HCL 5 MG PO TABS: 5.0000 mg | ORAL_TABLET | Freq: Once | ORAL | Status: DC | PRN

## 2013-06-18 MED ORDER — OXYCODONE HCL 5 MG PO TABS
ORAL_TABLET | ORAL | Status: AC
  Filled 2013-06-18: qty 2

## 2013-06-18 MED ORDER — FENTANYL CITRATE 0.05 MG/ML IJ SOLN
INTRAMUSCULAR | Status: AC
  Filled 2013-06-18: qty 2

## 2013-06-18 MED ORDER — 0.9 % SODIUM CHLORIDE (POUR BTL) OPTIME
TOPICAL | Status: DC | PRN
  Administered 2013-06-18: 1000 mL

## 2013-06-18 MED ORDER — LIDOCAINE-EPINEPHRINE (PF) 1 %-1:200000 IJ SOLN
INTRAMUSCULAR | Status: AC
  Filled 2013-06-18: qty 10

## 2013-06-18 MED ORDER — FENTANYL CITRATE 0.05 MG/ML IJ SOLN
INTRAMUSCULAR | Status: DC | PRN
  Administered 2013-06-18 (×2): 50 ug via INTRAVENOUS

## 2013-06-18 MED ORDER — GLYCOPYRROLATE 0.2 MG/ML IJ SOLN
INTRAMUSCULAR | Status: DC | PRN
  Administered 2013-06-18: 0.6 mg via INTRAVENOUS

## 2013-06-18 MED ORDER — PROPOFOL 10 MG/ML IV BOLUS
INTRAVENOUS | Status: DC | PRN
  Administered 2013-06-18: 180 mg via INTRAVENOUS

## 2013-06-18 MED ORDER — SUCCINYLCHOLINE CHLORIDE 20 MG/ML IJ SOLN
INTRAMUSCULAR | Status: DC | PRN
  Administered 2013-06-18: 140 mg via INTRAVENOUS

## 2013-06-18 MED ORDER — FENTANYL CITRATE 0.05 MG/ML IJ SOLN
25.0000 ug | INTRAMUSCULAR | Status: DC | PRN
  Administered 2013-06-18: 25 ug via INTRAVENOUS

## 2013-06-18 MED ORDER — ONDANSETRON HCL 4 MG/2ML IJ SOLN
4.0000 mg | Freq: Four times a day (QID) | INTRAMUSCULAR | Status: DC | PRN
Start: 2013-06-18 — End: 2013-06-18

## 2013-06-18 MED ORDER — HEPARIN SODIUM (PORCINE) 5000 UNIT/ML IJ SOLN
INTRAMUSCULAR | Status: DC | PRN
  Administered 2013-06-18: 10:00:00

## 2013-06-18 MED ORDER — OXYCODONE HCL 5 MG PO TABS: 10.0000 mg | ORAL_TABLET | Freq: Four times a day (QID) | ORAL | Status: DC

## 2013-06-18 MED ORDER — OXYCODONE HCL 5 MG PO TABS: 10.0000 mg | ORAL_TABLET | ORAL | Status: DC | PRN

## 2013-06-18 MED ORDER — SODIUM CHLORIDE 0.9 % IV SOLN
INTRAVENOUS | Status: DC
  Administered 2013-06-18 (×2): via INTRAVENOUS
  Administered 2013-06-18: 10 mL/h via INTRAVENOUS

## 2013-06-18 MED ORDER — NEOSTIGMINE METHYLSULFATE 1 MG/ML IJ SOLN
INTRAMUSCULAR | Status: DC | PRN
  Administered 2013-06-18: 3 mg via INTRAVENOUS

## 2013-06-18 SURGICAL SUPPLY — 34 items
CANISTER SUCTION 2500CC (MISCELLANEOUS) ×2 IMPLANT
CLIP TI MEDIUM 6 (CLIP) ×2 IMPLANT
CLIP TI WIDE RED SMALL 6 (CLIP) ×2 IMPLANT
COVER PROBE W GEL 5X96 (DRAPES) IMPLANT
COVER SURGICAL LIGHT HANDLE (MISCELLANEOUS) ×2 IMPLANT
DERMABOND ADVANCED (GAUZE/BANDAGES/DRESSINGS) ×3
DERMABOND ADVANCED .7 DNX12 (GAUZE/BANDAGES/DRESSINGS) ×3 IMPLANT
ELECT REM PT RETURN 9FT ADLT (ELECTROSURGICAL) ×2
ELECTRODE REM PT RTRN 9FT ADLT (ELECTROSURGICAL) ×1 IMPLANT
GEL ULTRASOUND 20GR AQUASONIC (MISCELLANEOUS) IMPLANT
GLOVE BIO SURGEON STRL SZ8 (GLOVE) ×2 IMPLANT
GLOVE BIOGEL PI IND STRL 6.5 (GLOVE) ×1 IMPLANT
GLOVE BIOGEL PI IND STRL 7.5 (GLOVE) ×1 IMPLANT
GLOVE BIOGEL PI IND STRL 8 (GLOVE) ×1 IMPLANT
GLOVE BIOGEL PI INDICATOR 6.5 (GLOVE) ×1
GLOVE BIOGEL PI INDICATOR 7.5 (GLOVE) ×1
GLOVE BIOGEL PI INDICATOR 8 (GLOVE) ×1
GLOVE SS BIOGEL STRL SZ 7 (GLOVE) ×1 IMPLANT
GLOVE SUPERSENSE BIOGEL SZ 7 (GLOVE) ×1
GOWN STRL NON-REIN LRG LVL3 (GOWN DISPOSABLE) ×6 IMPLANT
KIT BASIN OR (CUSTOM PROCEDURE TRAY) ×2 IMPLANT
KIT ROOM TURNOVER OR (KITS) ×2 IMPLANT
NS IRRIG 1000ML POUR BTL (IV SOLUTION) ×2 IMPLANT
PACK CV ACCESS (CUSTOM PROCEDURE TRAY) ×2 IMPLANT
PAD ARMBOARD 7.5X6 YLW CONV (MISCELLANEOUS) ×4 IMPLANT
SPONGE GAUZE 4X4 12PLY (GAUZE/BANDAGES/DRESSINGS) ×2 IMPLANT
SUT PROLENE 6 0 BV (SUTURE) ×2 IMPLANT
SUT VIC AB 3-0 SH 18 (SUTURE) ×2 IMPLANT
SUT VIC AB 3-0 SH 27 (SUTURE) ×3
SUT VIC AB 3-0 SH 27X BRD (SUTURE) ×3 IMPLANT
TOWEL OR 17X24 6PK STRL BLUE (TOWEL DISPOSABLE) ×2 IMPLANT
TOWEL OR 17X26 10 PK STRL BLUE (TOWEL DISPOSABLE) ×2 IMPLANT
UNDERPAD 30X30 INCONTINENT (UNDERPADS AND DIAPERS) ×2 IMPLANT
WATER STERILE IRR 1000ML POUR (IV SOLUTION) ×2 IMPLANT

## 2013-06-18 NOTE — Anesthesia Procedure Notes (Signed)
Procedure Name: Intubation Date/Time: 06/18/2013 10:09 AM Performed by: Eligha Bridegroom Pre-anesthesia Checklist: Patient identified, Timeout performed, Emergency Drugs available, Suction available and Patient being monitored Patient Re-evaluated:Patient Re-evaluated prior to inductionOxygen Delivery Method: Circle system utilized Preoxygenation: Pre-oxygenation with 100% oxygen Intubation Type: IV induction and Rapid sequence Laryngoscope Size: Mac and 4 Grade View: Grade I Tube type: Oral Tube size: 7.5 mm Number of attempts: 1 Airway Equipment and Method: Stylet Placement Confirmation: ETT inserted through vocal cords under direct vision and breath sounds checked- equal and bilateral Secured at: 23 cm Dental Injury: Teeth and Oropharynx as per pre-operative assessment

## 2013-06-18 NOTE — H&P (View-Only) (Signed)
[facility] [place]   Patient name: [name] MRN: [id number] DOB: [birth year] Sex: male    No chief complaint on file.   HISTORY OF PRESENT ILLNESS: 62 yo with left brachiocephalic fistula created by CSD in May of 2014.  HD access  is pulling clots and has infiltrated.  HD cath in place.  Past Medical History  Diagnosis Date  . Anemia   . Chronic kidney disease   . Anxiety   . Depression   . COPD (chronic obstructive pulmonary disease)     Pt denies  . Pneumonia     Hx: of  . GERD (gastroesophageal reflux disease)   . H/O hiatal hernia   . Headache(784.0)     Hx: Migraines  . Arthritis   . Numbness of toes     toes and feet  . Diabetes mellitus without complication   . Hypertension   . Renal insufficiency     Past Surgical History  Procedure Laterality Date  . Inguinal hernia repair      ,  times   2  . Tonsillectomy    . Av fistula placement  2012       left arm   . Colonoscopy  10/26/2011    Procedure: COLONOSCOPY;  Surgeon: Rogene Houston, MD;  [facility]: [facility];  Service: Endoscopy;  Laterality: N/A;  730  . Knee arthroscopy  2011    Right Knee  . Thrombectomy w/ embolectomy Left 12/11/2012    Procedure: THROMBECTOMY ARTERIOVENOUS FISTULA;  Surgeon: Serafina Mitchell, MD;  Location: [place];  Service: Vascular;  Laterality: Left;  . Av fistula placement Left 12/18/2012    Procedure: ARTERIOVENOUS (AV) FISTULA CREATION;  Surgeon: Angelia Mould, MD;  Location: [place];  Service: Vascular;  Laterality: Left;  . Insertion of dialysis catheter Left 12/18/2012    Procedure: INSERTION OF DIALYSIS CATHETER;  Surgeon: Angelia Mould, MD;  Location: [facility] OR;  Service: Vascular;  Laterality: Left;  . Esophagogastroduodenoscopy (egd) with esophageal dilation N/A 04/23/2013    Procedure: ESOPHAGOGASTRODUODENOSCOPY (EGD) WITH ESOPHAGEAL DILATION;  Surgeon: Rogene Houston, MD;  [facility];  Service: Endoscopy;  Laterality: N/A;   200-moved to 930     History   Social History  . Marital Status: Married    Spouse Name: N/A    Number of Children: N/A  . Years of Education: N/A   Occupational History  . Not on file.   Social History Main Topics  . Smoking status: Former Smoker -- 10 years    Types: Cigars    Quit date: 03/13/2012  . Smokeless tobacco: Never Used  . Alcohol Use: No  . Drug Use: No  . Sexual Activity: No   Other Topics Concern  . Not on file   Social History Narrative  . No narrative on file    Family History  Problem Relation Age of Onset  . Heart disease Father     Heart Disease before age 61    Allergies as of 06/01/2013 - Review Complete 05/05/2013  Allergen Reaction Noted  . Morphine and related Other (See Comments) 10/24/2011    No current facility-administered medications on file prior to encounter.   Current Outpatient Prescriptions on File Prior to Encounter  Medication Sig Dispense Refill  . B Complex-C-Zn-Folic Acid (DIALYVITE/ZINC PO) Take 1 tablet by mouth daily.      . cinacalcet (SENSIPAR) 30 MG tablet Take 30 mg by mouth daily.      Marland Kitchen  cyclobenzaprine (FLEXERIL) 10 MG tablet Take 10 mg by mouth 3 (three) times daily as needed for muscle spasms.      . diazepam (VALIUM) 10 MG tablet Take 10 mg by mouth 3 (three) times daily.      . diphenhydrAMINE (BENADRYL) 25 mg capsule Take 25 mg by mouth every 6 (six) hours as needed for itching.      Marland Kitchen FLUoxetine (PROZAC) 20 MG capsule Take 20 mg by mouth daily.      Marland Kitchen gabapentin (NEURONTIN) 100 MG capsule Take 100 mg by mouth at bedtime.      . Iron Sucrose (VENOFER IV) Inject into the vein. 50mg  once a week on Wednesday.      Marland Kitchen lanthanum (FOSRENOL) 1000 MG chewable tablet Chew 1,000 mg by mouth 2 (two) times daily with a meal.       . oxyCODONE (OXY IR/ROXICODONE) 5 MG immediate release tablet Take 2 tablets (10 mg total) by mouth 4 (four) times daily.  30 tablet  0  . polyethylene glycol (MIRALAX / GLYCOLAX) packet Take  17 g by mouth daily as needed (constipation).       . Sevelamer Carbonate (RENVELA PO) Take 2-4 tablets by mouth See admin instructions. 3-4 tabs daily three times daily with meals (depending on what he eats) and 2 tabs daily with snacks.      . simvastatin (ZOCOR) 40 MG tablet Take 40 mg by mouth every evening.         REVIEW OF SYSTEMS: Cardiovascular: No chest pain, chest pressure, palpitations Pulmonary: No productive cough, asthma or wheezing. Neurologic: No weakness, paresthesias, aphasia, or amaurosis. No dizziness. Hematologic: No bleeding problems or clotting disorders. Musculoskeletal: No joint pain or joint swelling. Gastrointestinal: No blood in stool or hematemesis Genitourinary: No dysuria or hematuria. Psychiatric:: No history of major depression. Integumentary: No rashes or ulcers. Constitutional: No fever or chills.  PHYSICAL EXAMINATION:   Vital signs are BP 121/53  Pulse 77  Temp(Src) 98.5 F (36.9 C) (Oral)  Resp 20  Ht 6\' 4"  (1.93 m)  Wt 251 lb (113.853 kg)  BMI 30.57 kg/m2  SpO2 99% General: The patient appears their stated age. HEENT:  No gross abnormalities Pulmonary:  Non labored breathing Abdomen: Soft and non-tender Musculoskeletal: There are no major deformities. Neurologic: No focal weakness or paresthesias are detected, Skin: There are no ulcer or rashes noted. Psychiatric: The patient has normal affect. Cardiovascular: left upper arm access with ecchymosis   Assessment: Poorly functioning access Plan: fistulogram possible intervention  V. Leia Alf, M.D. [facility] [place] Office: [phone] Pager:  [phone]

## 2013-06-18 NOTE — Transfer of Care (Signed)
Immediate Anesthesia Transfer of Care Note  Patient: [name]  Procedure(s) Performed: Procedure(s): FISTULA SUPERFICIALIZATION & LIGATION BRANCH X 1 (Left)  Patient Location: PACU  Anesthesia Type:General  Level of Consciousness: awake and oriented  Airway & Oxygen Therapy: Patient Spontanous Breathing and Patient connected to nasal cannula oxygen  Post-op Assessment: Report given to PACU RN and Post -op Vital signs reviewed and stable  Post vital signs: Reviewed and stable  Complications: No apparent anesthesia complications

## 2013-06-18 NOTE — Anesthesia Preprocedure Evaluation (Signed)
Anesthesia Evaluation  Patient identified by MRN, date of birth, ID band Patient awake    Reviewed: Allergy & Precautions, H&P , NPO status , Patient's Chart, lab work & pertinent test results  Airway Mallampati: II  Neck ROM: full    Dental   Pulmonary COPDformer smoker,          Cardiovascular hypertension,     Neuro/Psych  Headaches, Anxiety Depression    GI/Hepatic hiatal hernia, GERD-  ,  Endo/Other  diabetes, Type 2  Renal/GU ESRFRenal disease     Musculoskeletal  (+) Arthritis -,   Abdominal   Peds  Hematology   Anesthesia Other Findings   Reproductive/Obstetrics                           Anesthesia Physical Anesthesia Plan  ASA: III  Anesthesia Plan: General   Post-op Pain Management:    Induction: Intravenous  Airway Management Planned: LMA  Additional Equipment:   Intra-op Plan:   Post-operative Plan:   Informed Consent: I have reviewed the patients History and Physical, chart, labs and discussed the procedure including the risks, benefits and alternatives for the proposed anesthesia with the patient or authorized representative who has indicated his/her understanding and acceptance.     Plan Discussed with: CRNA, Anesthesiologist and Surgeon  Anesthesia Plan Comments:         Anesthesia Quick Evaluation

## 2013-06-18 NOTE — Interval H&P Note (Signed)
History and Physical Interval Note:  06/18/2013 9:52 AM  [name]  has presented today for surgery, with the diagnosis of complication with AVF  The various methods of treatment have been discussed with the patient and family. After consideration of risks, benefits and other options for treatment, the patient has consented to  Procedure(s): FISTULA SUPERFICIALIZATION & LIGATION BRANCH X 1 (Left) as a surgical intervention .  The patient's history has been reviewed, patient examined, no change in status, stable for surgery.  I have reviewed the patient's chart and labs.  Questions were answered to the patient's satisfaction.     Tinnie Gens

## 2013-06-18 NOTE — Op Note (Signed)
OPERATIVE REPORT  Date of Surgery: 06/18/2013  Surgeon: Tinnie Gens, MD  Assistant: Gerri Lins PA  Pre-op Diagnosis: Arteriovenous Fistula Competing Branch Left Arm  Post-op Diagnosis: End Stage Renal Disease  Procedure: Procedure(s): Ligation competing branch left brachial to cephalic AV fistula with superficialization  of fistula Anesthesia: Gen. endotracheal  EBL: Minimal  Complications: None  Procedure Details: Patient is a nondrinker placed in the supine position at which time satisfactory general endotracheal anesthesia was administered. The left upper extremity was prepped Betadine scrub and solution draped in routine sterile manner. The vein had been visualized with the SonoSite-ultrasound and the competing branch was marked in the mid upper arm. 3 short incisions were made over the fistula the fistula was completely mobilized from the antecubital area to the shoulder beneath the skin bridges. A large competing branches identified ligated with 3-0 silk tie and divided in the mid upper arm. There were a few other very small branches were also ligated during the mobilization process. Following this the tissue beneath the vein was approximated with some interrupted 3-0 Vicryl sutures to bring the cephalic vein more superficial in location. When this had been completed and hemostasis was achieved wound closed in layers with Vicryl in subcuticular fashion with Dermabond patient taken to recovery room in stable condition   Tinnie Gens, MD 06/18/2013 11:17 AM

## 2013-06-18 NOTE — Telephone Encounter (Addendum)
Message copied by Gena Fray on Thu Jun 18, 2013  2:38 PM ------      Message from: Alfonso Patten      Created: Thu Jun 18, 2013 12:20 PM                   ----- Message -----         From: Ulyses Amor, PA-C         Sent: 06/18/2013  11:25 AM           To: Alfonso Patten, RN            F/u in office with dr. Trula Slade incision ck. S/p transposition left AV fistula.  4 weeks ------  Spoke with patient, dpm

## 2013-06-19 ENCOUNTER — Encounter (HOSPITAL_COMMUNITY): Payer: Self-pay | Admitting: Vascular Surgery

## 2013-06-19 ENCOUNTER — Encounter (INDEPENDENT_AMBULATORY_CARE_PROVIDER_SITE_OTHER): Payer: Self-pay

## 2013-06-21 NOTE — Anesthesia Postprocedure Evaluation (Signed)
Anesthesia Post Note  Patient: [name]  Procedure(s) Performed: Procedure(s) (LRB): FISTULA SUPERFICIALIZATION & LIGATION BRANCH X 1 (Left)  Anesthesia type: General  Patient location: PACU  Post pain: Pain level controlled and Adequate analgesia  Post assessment: Post-op Vital signs reviewed, Patient's Cardiovascular Status Stable, Respiratory Function Stable, Patent Airway and Pain level controlled  Last Vitals:  Filed Vitals:   06/18/13 1301  BP: 127/78  Pulse: 72  Temp:   Resp: 15    Post vital signs: Reviewed and stable  Level of consciousness: awake, alert  and oriented  Complications: No apparent anesthesia complications

## 2013-07-08 ENCOUNTER — Encounter: Payer: Self-pay | Admitting: Surgery

## 2013-07-13 ENCOUNTER — Encounter: Payer: 59 | Admitting: Surgery

## 2013-07-24 ENCOUNTER — Encounter: Payer: Self-pay | Admitting: Surgery

## 2013-07-27 ENCOUNTER — Ambulatory Visit (INDEPENDENT_AMBULATORY_CARE_PROVIDER_SITE_OTHER): Payer: Self-pay | Admitting: Surgery

## 2013-07-27 ENCOUNTER — Encounter (INDEPENDENT_AMBULATORY_CARE_PROVIDER_SITE_OTHER): Payer: Self-pay

## 2013-07-27 ENCOUNTER — Encounter: Payer: Self-pay | Admitting: Surgery

## 2013-07-27 VITALS — BP 153/73 | HR 76 | Resp 18 | Ht 76.0 in | Wt 256.0 lb

## 2013-07-27 DIAGNOSIS — N186 End stage renal disease: Secondary | ICD-10-CM

## 2013-07-27 NOTE — Progress Notes (Signed)
The patient is back today for followup.  She is status post left brachiocephalic fistula creation 1 12/18/2012.  Then on 06/18/2013, he underwent branch ligation and elevation of the fistula.  He continues to receive dialysis via a catheter.  On examination, his incisions have healed nicely.  There is an extra thrill within the fistula.  He has no evidence of steal syndrome.  I believe his fistula is ready for cannulation.  The patient will followup on an as-needed basis the

## 2013-08-04 ENCOUNTER — Ambulatory Visit (INDEPENDENT_AMBULATORY_CARE_PROVIDER_SITE_OTHER): Payer: 59 | Admitting: Internal Medicine

## 2013-09-22 DIAGNOSIS — N185 Chronic kidney disease, stage 5: Secondary | ICD-10-CM | POA: Insufficient documentation

## 2013-12-23 ENCOUNTER — Other Ambulatory Visit: Payer: Self-pay | Admitting: *Deleted

## 2013-12-23 DIAGNOSIS — M79609 Pain in unspecified limb: Secondary | ICD-10-CM

## 2014-01-12 ENCOUNTER — Encounter: Payer: Self-pay | Admitting: Vascular Surgery

## 2014-01-13 ENCOUNTER — Encounter: Payer: 59 | Admitting: Vascular Surgery

## 2014-01-13 ENCOUNTER — Encounter: Payer: Self-pay | Admitting: Vascular Surgery

## 2014-01-13 ENCOUNTER — Encounter (HOSPITAL_COMMUNITY): Payer: 59

## 2014-01-14 ENCOUNTER — Encounter: Payer: Self-pay | Admitting: Vascular Surgery

## 2014-01-14 ENCOUNTER — Other Ambulatory Visit: Payer: Self-pay | Admitting: Vascular Surgery

## 2014-01-14 ENCOUNTER — Ambulatory Visit (INDEPENDENT_AMBULATORY_CARE_PROVIDER_SITE_OTHER): Payer: 59 | Admitting: Vascular Surgery

## 2014-01-14 ENCOUNTER — Ambulatory Visit (HOSPITAL_COMMUNITY)
Admission: RE | Admit: 2014-01-14 | Discharge: 2014-01-14 | Disposition: A | Discharge status: Home / Self Care | Payer: 59 | Source: Ambulatory Visit | Attending: Vascular Surgery | Admitting: Vascular Surgery

## 2014-01-14 ENCOUNTER — Ambulatory Visit (INDEPENDENT_AMBULATORY_CARE_PROVIDER_SITE_OTHER)
Admission: RE | Admit: 2014-01-14 | Discharge: 2014-01-14 | Disposition: A | Discharge status: Home / Self Care | Payer: 59 | Source: Ambulatory Visit | Attending: Vascular Surgery | Admitting: Vascular Surgery

## 2014-01-14 VITALS — BP 156/81 | HR 83 | Ht 76.0 in | Wt 257.8 lb

## 2014-01-14 DIAGNOSIS — I739 Peripheral vascular disease, unspecified: Secondary | ICD-10-CM | POA: Insufficient documentation

## 2014-01-14 DIAGNOSIS — I70229 Atherosclerosis of native arteries of extremities with rest pain, unspecified extremity: Secondary | ICD-10-CM

## 2014-01-14 DIAGNOSIS — M79609 Pain in unspecified limb: Secondary | ICD-10-CM | POA: Insufficient documentation

## 2014-01-14 NOTE — Progress Notes (Signed)
[facility] HISTORY AND PHYSICAL  Referring: Dr. Erling Cruz History of Present Illness:  Patient is a 63 y.o. year old male who presents for evaluation of lower extremity pain. The patient describes a freezing cold sensation that has been present in both feet for approximately one year. He has been told in the past this was neuropathy. He is a former smoker but quit 10 years ago. He denies any nonhealing wounds on his feet. He states that his legs fatigue easily at walking one to 2 blocks. This is somewhat relieved by rest. Sometimes the pain will come on after sitting for long periods of time.  Other medical problems include  End-.stage renal disease dialysis Monday Wednesday and Friday. He hass now been on dialysis for 18 months.  This is via left upper arm fistula. He does occasionally have some numbness and tingling in his left hand. He also has history of anemia anxiety depression COPD diabetes hypertension all of which are currently stable.  Past Medical History  Diagnosis Date  . Anemia   . Chronic kidney disease   . Anxiety   . Depression   . COPD (chronic obstructive pulmonary disease)     Pt denies  . Pneumonia     Hx: of  . GERD (gastroesophageal reflux disease)   . H/O hiatal hernia   . Headache(784.0)     Hx: Migraines  . Arthritis   . Numbness of toes     toes and feet  . Diabetes mellitus without complication   . Hypertension   . Renal insufficiency     Past Surgical History  Procedure Laterality Date  . Inguinal hernia repair      ,  times   2  . Tonsillectomy    . Av fistula placement  2012       left arm   . Colonoscopy  10/26/2011    Procedure: COLONOSCOPY;  Surgeon: Rogene Houston, MD;  [facility];  Service: Endoscopy;  Laterality: N/A;  730  . Knee arthroscopy  2011    Right Knee  . Thrombectomy w/ embolectomy Left 12/11/2012    Procedure: THROMBECTOMY ARTERIOVENOUS FISTULA;  Surgeon: Serafina Mitchell, MD;   Location: [place];  Service: Vascular;  Laterality: Left;  . Av fistula placement Left 12/18/2012    Procedure: ARTERIOVENOUS (AV) FISTULA CREATION;  Surgeon: Angelia Mould, MD;  Location: [place];  Service: Vascular;  Laterality: Left;  . Insertion of dialysis catheter Left 12/18/2012    Procedure: INSERTION OF DIALYSIS CATHETER;  Surgeon: Angelia Mould, MD;  Location: [facility];  Service: Vascular;  Laterality: Left;  . Esophagogastroduodenoscopy (egd) with esophageal dilation N/A 04/23/2013    Procedure: ESOPHAGOGASTRODUODENOSCOPY (EGD) WITH ESOPHAGEAL DILATION;  Surgeon: Rogene Houston, MD;  [facility];  Service: Endoscopy;  Laterality: N/A;  200-moved to 930   . Fistula superficialization Left 06/18/2013    Procedure: FISTULA SUPERFICIALIZATION & LIGATION BRANCH X 1;  Surgeon: Mal Misty, MD;  Location: [facility] OR;  Service: Vascular;  Laterality: Left;    Social History History  Substance Use Topics  . Smoking status: Former Smoker -- 10 years    Types: Cigars    Quit date: 03/13/2012  . Smokeless tobacco: Never Used  . Alcohol Use: No    Family History Family History  Problem Relation Age of Onset  . Heart disease Father     Heart Disease before age 20    Allergies  Allergies  Allergen Reactions  . Morphine And Related Other (See Comments)    Not effective     Current Outpatient Prescriptions  Medication Sig Dispense Refill  . acetaminophen (TYLENOL) 500 MG tablet Take 1,000 mg by mouth every 6 (six) hours as needed for pain.      . B Complex-C-Zn-Folic Acid (DIALYVITE/ZINC PO) Take 1 tablet by mouth daily.      . cinacalcet (SENSIPAR) 30 MG tablet Take 30 mg by mouth daily.      . cyclobenzaprine (FLEXERIL) 10 MG tablet Take 10 mg by mouth 3 (three) times daily as needed for muscle spasms.      . diazepam (VALIUM) 10 MG tablet Take 10 mg by mouth 3 (three) times daily.      . diphenhydrAMINE (BENADRYL) 25 mg capsule Take 25 mg by mouth every 6 (six)  hours as needed for itching.      Marland Kitchen FLUoxetine (PROZAC) 20 MG capsule Take 20 mg by mouth daily.      Marland Kitchen gabapentin (NEURONTIN) 100 MG capsule Take 100 mg by mouth 3 (three) times daily.       . Iron Sucrose (VENOFER IV) Inject into the vein. 50mg  once a week on Wednesday.      Marland Kitchen lanthanum (FOSRENOL) 1000 MG chewable tablet Chew 1,000 mg by mouth 2 (two) times daily with a meal.       . lanthanum (FOSRENOL) 1000 MG chewable tablet Chew 1,000 mg by mouth 2 (two) times daily with a meal.      . Melatonin 10 MG TABS Take 10 mg by mouth at bedtime.      . NONFORMULARY OR COMPOUNDED ITEM 10 mg daily. Domperidone      . oxyCODONE (OXY IR/ROXICODONE) 5 MG immediate release tablet Take 2 tablets (10 mg total) by mouth 4 (four) times daily.  30 tablet  0  . polyethylene glycol (MIRALAX / GLYCOLAX) packet Take 17 g by mouth daily as needed (constipation).       . polyvinyl alcohol (LIQUIFILM TEARS) 1.4 % ophthalmic solution Place 1-2 drops into both eyes as needed (for dry eyes).      . Probiotic Product (PHILLIPS COLON HEALTH PO) Take 1 capsule by mouth daily as needed (constipation).      . ranitidine (ZANTAC) 150 MG tablet Take 150 mg by mouth daily as needed for heartburn.      . Sevelamer Carbonate (RENVELA PO) Take 2-4 tablets by mouth See admin instructions. 3-4 tabs daily three times daily with meals (depending on what he eats) and 2 tabs daily with snacks.      . simvastatin (ZOCOR) 40 MG tablet Take 40 mg by mouth every evening.       No current facility-administered medications for this visit.    ROS:   General:  No weight loss, Fever, chills  HEENT: No recent headaches, no nasal bleeding, no visual changes, no sore throat  Neurologic: No dizziness, blackouts, seizures. No recent symptoms of stroke or mini- stroke. No recent episodes of slurred speech, or temporary blindness.  Cardiac: No recent episodes of chest pain/pressure, no shortness of breath at rest.  + shortness of breath with  exertion.  Denies history of atrial fibrillation or irregular heartbeat  Vascular: + history of rest pain in feet.  + history of claudication.  No history of non-healing ulcer, No history of DVT   Pulmonary: No home oxygen, no productive cough, no hemoptysis,  No asthma or wheezing  Musculoskeletal:  [x ]  Arthritis, [ ]  Low back pain,  [x ] Joint pain  Hematologic:No history of hypercoagulable state.  No history of easy bleeding.  No history of anemia  Gastrointestinal: No hematochezia or melena,  No gastroesophageal reflux, no trouble swallowing  Urinary: [x ] chronic Kidney disease, [x ] on HD - [ x] MWF or [ ]  TTHS, [ ]  Burning with urination, [ ]  Frequent urination, [ ]  Difficulty urinating;   Skin: No rashes  Psychological: + history of anxiety,  + history of depression   Physical Examination  Filed Vitals:   01/14/14 1345  BP: 156/81  Pulse: 83  Height: 6\' 4"  (1.93 m)  Weight: 257 lb 12.8 oz (116.937 kg)  SpO2: 100%    Body mass index is 31.39 kg/(m^2).  General:  Alert and oriented, no acute distress HEENT: Normal Neck: Bilateral bruits most likely from radiation from his AV fistula  Pulmonary: Clear to auscultation bilaterally AV fistula bruit third throughout the chest cavity Cardiac: Regular Rate and Rhythm without murmur Abdomen: Soft, non-tender, non-distended, no mass Skin: No rash, no ulcer  Extremity Pulses:  2+ right radial, bilateral brachial, bilateral femoral,  absent bilateral dorsalis pedis, 2+ right posterior tibial pulse absent left posterior tibial pulse, left upper extremity palpable thrill in the upper arm AV fistula no palpable left radial to Musculoskeletal: No deformity or edema  Neurologic: Upper and lower extremity motor 5/5 and symmetric  DATA:  Patient had bilateral ABIs performed today as well as a left lower extremity arterial duplex exam. I reviewed and interpreted this study. ABI on the right was 0.99 with triphasic waveforms. ABI on the  left was 0.78 with biphasic waveforms. Lower extremity arterial duplex exam showed a high-grade mid left superficial femoral artery stenosis 449 cm/s    ASSESSMENT:  bilateral lower extremity pain most likely secondary to neuropathy. The patient does have evidence of peripheral arterial disease and a left superficial femoral artery stenosis on the left side. He has palpable pulses on the right side which suggests no significant arterial occlusive disease on the right. Since his symptoms are bilateral I do not believe that intervention on the left side is necessarily going to improve his overall symptoms. I did discuss with the patient today arteriogram possible intervention on the left lower extremity. However, he was reassured that his perfusion with reasonable he was not currently at risk of limb loss. At this point he prefers close surveillance. He will followup with [place] with repeat noninvasive exam in 6 months time. He'll return sooner if his symptoms are more progressive.   PLAN:  see above   [name], MD [facility] [place] Office: [phone] Pager: [phone]

## 2014-01-15 NOTE — Addendum Note (Signed)
Addended by: Dorthula Rue L on: 01/15/2014 09:30 AM   Modules accepted: Orders

## 2014-01-18 ENCOUNTER — Encounter (INDEPENDENT_AMBULATORY_CARE_PROVIDER_SITE_OTHER): Payer: Self-pay

## 2014-07-14 ENCOUNTER — Encounter: Payer: Self-pay | Admitting: Vascular Surgery

## 2014-07-15 ENCOUNTER — Ambulatory Visit (HOSPITAL_COMMUNITY)
Admission: RE | Admit: 2014-07-15 | Discharge: 2014-07-15 | Disposition: A | Discharge status: Home / Self Care | Payer: 59 | Source: Ambulatory Visit | Attending: Vascular Surgery | Admitting: Vascular Surgery

## 2014-07-15 ENCOUNTER — Ambulatory Visit (INDEPENDENT_AMBULATORY_CARE_PROVIDER_SITE_OTHER): Payer: 59 | Admitting: Vascular Surgery

## 2014-07-15 ENCOUNTER — Encounter: Payer: Self-pay | Admitting: Vascular Surgery

## 2014-07-15 VITALS — BP 122/72 | HR 77 | Temp 97.9°F | Resp 16 | Ht 76.0 in | Wt 254.0 lb

## 2014-07-15 DIAGNOSIS — I70229 Atherosclerosis of native arteries of extremities with rest pain, unspecified extremity: Secondary | ICD-10-CM

## 2014-07-15 DIAGNOSIS — I739 Peripheral vascular disease, unspecified: Secondary | ICD-10-CM | POA: Insufficient documentation

## 2014-07-15 DIAGNOSIS — N186 End stage renal disease: Secondary | ICD-10-CM

## 2014-07-15 NOTE — Addendum Note (Signed)
Addended by: Mena Goes on: 07/15/2014 04:36 PM   Modules accepted: Orders

## 2014-07-15 NOTE — Progress Notes (Signed)
Established Patient  History of Present Illness  [name] is a 63 y.o. (1950/12/29) male who presents with chief complaint: Bilateral leg and feet tiredness.  The patient's symptoms have not progressed.  The patient's symptoms are: Getting tired after walking more than 2/10 of a mile.  He is walking daily and trying to increase his tolerance.  He present today for repeat ABI's.  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    Past Surgical History  Procedure Laterality Date  . Inguinal hernia repair      ,  times   2  . Tonsillectomy    . Av fistula placement  2012       left arm   . Colonoscopy  10/26/2011    Procedure: COLONOSCOPY;  Surgeon: Rogene Houston, MD;  [facility];  Service: Endoscopy;  Laterality: N/A;  730  . Knee arthroscopy  2011    Right Knee  . Thrombectomy w/ embolectomy Left 12/11/2012    Procedure: THROMBECTOMY ARTERIOVENOUS FISTULA;  Surgeon: Serafina Mitchell, MD;  Location: [place];  Service: Vascular;  Laterality: Left;  . Av fistula placement Left 12/18/2012    Procedure: ARTERIOVENOUS (AV) FISTULA CREATION;  Surgeon: Angelia Mould, MD;  Location: [place];  Service: Vascular;  Laterality: Left;  . Insertion of dialysis catheter Left 12/18/2012    Procedure: INSERTION OF DIALYSIS CATHETER;  Surgeon: Angelia Mould, MD;  Location: [facility] OR;  Service: Vascular;  Laterality: Left;  . Esophagogastroduodenoscopy (egd) with esophageal dilation N/A 04/23/2013    Procedure: ESOPHAGOGASTRODUODENOSCOPY (EGD) WITH ESOPHAGEAL DILATION;  Surgeon: Rogene Houston, MD;  [facility];  Service: Endoscopy;  Laterality: N/A;  200-moved to 930   . Fistula superficialization Left 06/18/2013    Procedure: FISTULA SUPERFICIALIZATION & LIGATION BRANCH X 1;  Surgeon: Mal Misty, MD;  Location: [place];  Service: Vascular;  Laterality: Left;   Outpatient Encounter Prescriptions as of 07/15/2014  Medication Sig Note  .  acetaminophen (TYLENOL) 500 MG tablet Take 1,000 mg by mouth every 6 (six) hours as needed for pain.   . B Complex-C-Zn-Folic Acid (DIALYVITE/ZINC PO) Take 1 tablet by mouth daily.   . carvedilol (COREG) 6.25 MG tablet  07/15/2014: Received from: External Pharmacy  . cinacalcet (SENSIPAR) 30 MG tablet Take 30 mg by mouth daily.   . cyclobenzaprine (FLEXERIL) 10 MG tablet Take 10 mg by mouth 3 (three) times daily as needed for muscle spasms.   . diazepam (VALIUM) 10 MG tablet Take 10 mg by mouth 3 (three) times daily.   . diphenhydrAMINE (BENADRYL) 25 mg capsule Take 25 mg by mouth every 6 (six) hours as needed for itching.   Marland Kitchen FLUoxetine (PROZAC) 20 MG capsule Take 20 mg by mouth daily.   Marland Kitchen gabapentin (NEURONTIN) 100 MG capsule Take 100 mg by mouth 3 (three) times daily.    . Insulin Glargine (TOUJEO SOLOSTAR) 300 UNIT/ML SOPN Inject into the skin.   . Iron Sucrose (VENOFER IV) Inject into the vein. 50mg  once a week on Wednesday.   Marland Kitchen lanthanum (FOSRENOL) 1000 MG chewable tablet Chew 1,000 mg by mouth 2 (two) times daily with a meal.    . lanthanum (FOSRENOL) 1000 MG chewable tablet Chew 1,000 mg by mouth 2 (two) times daily with a meal.   . Melatonin 10 MG TABS Take 10 mg by mouth at bedtime.   . NONFORMULARY OR COMPOUNDED ITEM 10  mg daily. Domperidone 06/02/2013: compounded by [employer], [address] [place], [place] [address] for [facility], [place], [place]   . oxyCODONE (OXY IR/ROXICODONE) 5 MG immediate release tablet Take 2 tablets (10 mg total) by mouth 4 (four) times daily.   . polyethylene glycol (MIRALAX / GLYCOLAX) packet Take 17 g by mouth daily as needed (constipation).    . polyvinyl alcohol (LIQUIFILM TEARS) 1.4 % ophthalmic solution Place 1-2 drops into both eyes as needed (for dry eyes).   . Probiotic Product (PHILLIPS COLON HEALTH PO) Take 1 capsule by mouth daily as needed (constipation).   . ranitidine (ZANTAC) 150 MG tablet Take 150 mg by mouth daily as needed for  heartburn.   . Sevelamer Carbonate (RENVELA PO) Take 2-4 tablets by mouth See admin instructions. 3-4 tabs daily three times daily with meals (depending on what he eats) and 2 tabs daily with snacks.   . simvastatin (ZOCOR) 40 MG tablet Take 40 mg by mouth every evening.   . temazepam (RESTORIL) 30 MG capsule  07/15/2014: Received from: External Pharmacy     Review of Systems  Constitutional: Negative for fever, chills and weight loss.  HENT: Negative for congestion and sore throat.   Eyes: Negative for blurred vision and pain.  Respiratory: Negative for cough and hemoptysis.   Cardiovascular: Negative for palpitations.  Gastrointestinal: Negative for nausea, vomiting and diarrhea.  Genitourinary: Negative for flank pain.  Musculoskeletal: Positive for back pain.  Neurological: Positive for tingling. Negative for loss of consciousness and headaches.  Endo/Heme/Allergies: Does not bruise/bleed easily.  Psychiatric/Behavioral: The patient is nervous/anxious.     Physical Examination  Filed Vitals:   07/15/14 1328  BP: 122/72  Pulse: 77  Temp: 97.9 F (36.6 C)  TempSrc: Oral  Resp: 16  Height: 6\' 4"  (1.93 m)  Weight: 254 lb (115.214 kg)  SpO2: 98%   Body mass index is 30.93 kg/(m^2).  General: A&O x 3, WD  Pulmonary: Sym exp, good air movt, CTAB, no rales, rhonchi, & wheezing   Cardiac: RRR no murmurs  Vascular: Vessel Right Left  Radial Palpable Palpable  Brachial Palpable Palpable  Carotid Palpable, without bruit Palpable, without bruit  Aorta Not palpable N/A  Femoral Palpable Palpable  Popliteal Not palpable Not palpable  PT Palpable notPalpable  DP Palpable notPalpable   Palpable thrill left upper arm AV fistula  Gastrointestinal: soft, NTND  Musculoskeletal: M/S 5/5 throughout , Extremities without ischemic changes   Neurologic: Pain and light touch intact in extremities  Non-Invasive Vascular Imaging ABI (Date: 07/15/2014)  ABI on the right was 0.99  with triphasic waveforms. ABI on the left was 0.88 with biphasic waveforms.  Medical Decision Making  [name] is a 63 y.o. 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  His ABI's are essentially unchanged.  He is walking a little more and will continue to increase his tolerance.  He will return in 6 months for bilateral ABI's.  [name] [facility] PA-C [facility] [place] Office: [phone]   07/15/2014, 1:54 PM    History and exam details as above.  Patient has easy fatigue of both lower extremities after walking one quarter of a mile. He does not really describe claudication. He has no rest pain or open ulcerations. His ABIs of been stable. He has a normal pulse exam in his right lower extremity.  I am unable to palpate pedal pulses in the left side in his ABIs 0.88. I would favor continued conservative management for now. If the patient develops claudication symptoms are more severe symptoms in the left lower extremity with consider arteriogram otherwise we will continue intermittent checks to make sure that he is not deteriorating.  [name], MD [facility] [place] Office: [phone] Pager: [phone]

## 2014-07-22 ENCOUNTER — Encounter (HOSPITAL_COMMUNITY): Payer: Self-pay | Admitting: Surgery

## 2014-08-02 ENCOUNTER — Telehealth: Payer: Self-pay

## 2014-08-02 DIAGNOSIS — R200 Anesthesia of skin: Secondary | ICD-10-CM

## 2014-08-02 DIAGNOSIS — M79622 Pain in left upper arm: Secondary | ICD-10-CM

## 2014-08-02 NOTE — Telephone Encounter (Signed)
Rec'd call from [facility]  Reported pt. C/o pain in fingers of left hand and forearm with decreased use of his (L) hand fingers.  The nurse from the kidney center is unsure if the symptoms occur in conjunction with treatment.  Discussed with Dr. Trula Slade.  Recommended to schedule Left Upper extremity arterial study to r/o [place], and f/u appt.  Pt. Will be notified of appt.

## 2014-08-09 ENCOUNTER — Other Ambulatory Visit: Payer: Self-pay | Admitting: *Deleted

## 2014-08-09 DIAGNOSIS — M79643 Pain in unspecified hand: Secondary | ICD-10-CM

## 2014-08-10 ENCOUNTER — Encounter: Payer: Self-pay | Admitting: Vascular Surgery

## 2014-08-11 ENCOUNTER — Other Ambulatory Visit (HOSPITAL_COMMUNITY): Payer: 59

## 2014-08-11 ENCOUNTER — Ambulatory Visit: Payer: 59 | Admitting: Vascular Surgery

## 2014-08-25 ENCOUNTER — Encounter: Payer: Self-pay | Admitting: Vascular Surgery

## 2014-08-26 ENCOUNTER — Encounter: Payer: Self-pay | Admitting: Vascular Surgery

## 2014-08-26 ENCOUNTER — Ambulatory Visit (HOSPITAL_COMMUNITY)
Admission: RE | Admit: 2014-08-26 | Discharge: 2014-08-26 | Disposition: A | Discharge status: Home / Self Care | Payer: 59 | Source: Ambulatory Visit | Attending: Vascular Surgery | Admitting: Vascular Surgery

## 2014-08-26 ENCOUNTER — Ambulatory Visit (INDEPENDENT_AMBULATORY_CARE_PROVIDER_SITE_OTHER): Payer: 59 | Admitting: Vascular Surgery

## 2014-08-26 VITALS — BP 128/81 | HR 90 | Temp 98.3°F | Resp 16 | Ht 76.0 in | Wt 263.0 lb

## 2014-08-26 DIAGNOSIS — M79643 Pain in unspecified hand: Secondary | ICD-10-CM | POA: Insufficient documentation

## 2014-08-26 DIAGNOSIS — R202 Paresthesia of skin: Secondary | ICD-10-CM | POA: Diagnosis not present

## 2014-08-26 DIAGNOSIS — R200 Anesthesia of skin: Secondary | ICD-10-CM

## 2014-08-26 NOTE — Progress Notes (Signed)
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 The patient had a left brachiocephalic AV fistula placed by Dr. Scot Dock in May 2014. He subsequently had Superficialization and side branch ligation of this in November 2014 by Dr. Kellie Simmering. He has also previously had a left radiocephalic fistula which failed. He states that he has numbness and tingling in all 5 digits in his left hand. He states that after his radiocephalic AV fistula he did have numbness and tingling of digits 1 and 2. This is gotten worse since his new fistula. However, he does describe some numbness and tingling in his right hand as well although not as bad as his left. He does not have any nonhealing wounds or ulcerations or weakness in the left hand. The numbness and tingling occasionally gets worse on dialysis but this is not consistent. He currently is on a list to get a kidney transplant at [facility].  Review of systems: He denies fever or chills. He denies shortness of breath or chest pain.  Past Medical History  Diagnosis Date  . Anemia   . Chronic kidney disease   . Anxiety   . Depression   . COPD (chronic obstructive pulmonary disease)     Pt denies  . Pneumonia     Hx: of  . GERD (gastroesophageal reflux disease)   . H/O hiatal hernia   . Headache(784.0)     Hx: Migraines  . Arthritis   . Numbness of toes     toes and feet  . Diabetes mellitus without complication   . Hypertension   . Renal insufficiency     Past Surgical History  Procedure Laterality Date  . Inguinal hernia repair      ,  times   2  . Tonsillectomy    . Av fistula placement  2012       left arm   . Colonoscopy  10/26/2011    Procedure: COLONOSCOPY;  Surgeon: Rogene Houston, MD;  [facility];  Service: Endoscopy;  Laterality: N/A;  730  . Knee arthroscopy  2011    Right Knee  . Thrombectomy w/ embolectomy Left 12/11/2012    Procedure: THROMBECTOMY ARTERIOVENOUS FISTULA;   Surgeon: Serafina Mitchell, MD;  Location: [place];  Service: Vascular;  Laterality: Left;  . Av fistula placement Left 12/18/2012    Procedure: ARTERIOVENOUS (AV) FISTULA CREATION;  Surgeon: Angelia Mould, MD;  Location: [place];  Service: Vascular;  Laterality: Left;  . Insertion of dialysis catheter Left 12/18/2012    Procedure: INSERTION OF DIALYSIS CATHETER;  Surgeon: Angelia Mould, MD;  Location: [facility] OR;  Service: Vascular;  Laterality: Left;  . Esophagogastroduodenoscopy (egd) with esophageal dilation N/A 04/23/2013    Procedure: ESOPHAGOGASTRODUODENOSCOPY (EGD) WITH ESOPHAGEAL DILATION;  Surgeon: Rogene Houston, MD;  [facility];  Service: Endoscopy;  Laterality: N/A;  200-moved to 930   . Fistula superficialization Left 06/18/2013    Procedure: FISTULA SUPERFICIALIZATION & LIGATION BRANCH X 1;  Surgeon: Mal Misty, MD;  Location: [place];  Service: Vascular;  Laterality: Left;  . Shuntogram N/A 12/09/2012    Procedure: fistulogram;  Surgeon: Serafina Mitchell, MD;  Location: [facility] CATH LAB;  Service: Cardiovascular;  Laterality: N/A;  . Embolectomy Left 12/09/2012    Procedure: EMBOLECTOMY;  Surgeon: Serafina Mitchell, MD;  Location: [facility] CATH LAB;  Service: Cardiovascular;  Laterality: Left;  left arm venous embolization  . Shuntogram Left  06/03/2013    Procedure: Fistulogram;  Surgeon: Serafina Mitchell, MD;  Location: [facility] CATH LAB;  Service: Cardiovascular;  Laterality: Left;   Current Outpatient Prescriptions on File Prior to Visit  Medication Sig Dispense Refill  . acetaminophen (TYLENOL) 500 MG tablet Take 1,000 mg by mouth every 6 (six) hours as needed for pain.    . B Complex-C-Zn-Folic Acid (DIALYVITE/ZINC PO) Take 1 tablet by mouth daily.    . carvedilol (COREG) 6.25 MG tablet   3  . cinacalcet (SENSIPAR) 30 MG tablet Take 30 mg by mouth daily.    . cyclobenzaprine (FLEXERIL) 10 MG tablet Take 10 mg by mouth 3 (three) times daily as needed for muscle spasms.    .  diazepam (VALIUM) 10 MG tablet Take 10 mg by mouth 3 (three) times daily.    . diphenhydrAMINE (BENADRYL) 25 mg capsule Take 25 mg by mouth every 6 (six) hours as needed for itching.    Marland Kitchen FLUoxetine (PROZAC) 20 MG capsule Take 20 mg by mouth daily.    Marland Kitchen gabapentin (NEURONTIN) 100 MG capsule Take 100 mg by mouth 3 (three) times daily.     . Insulin Glargine (TOUJEO SOLOSTAR) 300 UNIT/ML SOPN Inject into the skin.    . Iron Sucrose (VENOFER IV) Inject into the vein. 50mg  once a week on Wednesday.    Marland Kitchen lanthanum (FOSRENOL) 1000 MG chewable tablet Chew 1,000 mg by mouth 2 (two) times daily with a meal.     . lanthanum (FOSRENOL) 1000 MG chewable tablet Chew 1,000 mg by mouth 2 (two) times daily with a meal.    . Melatonin 10 MG TABS Take 10 mg by mouth at bedtime.    . NONFORMULARY OR COMPOUNDED ITEM 10 mg daily. Domperidone    . oxyCODONE (OXY IR/ROXICODONE) 5 MG immediate release tablet Take 2 tablets (10 mg total) by mouth 4 (four) times daily. 30 tablet 0  . polyethylene glycol (MIRALAX / GLYCOLAX) packet Take 17 g by mouth daily as needed (constipation).     . polyvinyl alcohol (LIQUIFILM TEARS) 1.4 % ophthalmic solution Place 1-2 drops into both eyes as needed (for dry eyes).    . Probiotic Product (PHILLIPS COLON HEALTH PO) Take 1 capsule by mouth daily as needed (constipation).    . ranitidine (ZANTAC) 150 MG tablet Take 150 mg by mouth daily as needed for heartburn.    . Sevelamer Carbonate (RENVELA PO) Take 2-4 tablets by mouth See admin instructions. 3-4 tabs daily three times daily with meals (depending on what he eats) and 2 tabs daily with snacks.    . simvastatin (ZOCOR) 40 MG tablet Take 40 mg by mouth every evening.    . temazepam (RESTORIL) 30 MG capsule   0   No current facility-administered medications on file prior to visit.    Physical exam:  Filed Vitals:   08/26/14 1609  BP: 128/81  Pulse: 90  Temp: 98.3 F (36.8 C)  TempSrc: Oral  Resp: 16  Height: 6\' 4"  (1.93 m)   Weight: 263 lb (119.296 kg)  SpO2: 97%    Left upper extremity: Well-healed incisions left forearm left upper arm palpable thrill and upper arm fistula hands are pink and warm and symmetric bilaterally. No palpable radial pulse bilaterally.  Data: Patient had a duplex ultrasound of his fistula today with compression there was a 60 mm increase in pressure to the left hand.  Assessment: Numbness and tingling left hand most likely multifactorial. 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 He also has a component of peripheral neuropathy. I discussed with the patient today the possibility of ligating the AV fistula for improvement of his symptoms. He currently believes that his symptoms are tolerable and would prefer to not lose the fistula at this point. I did discuss with him the possibility of the numbness and tingling becoming permanent as well as the possibility of ulcerations or nonhealing wounds on his hand. He is going to continue to observe this for now. If he notices atrophy of the muscles in his hand or development of ulcers on his fingers he will return for ligation of the fistula. Otherwise he will return in 6 weeks for a follow-up visit to see how his symptoms are doing.  [name], MD [facility] [place] Office: [phone] Pager: [phone]

## 2014-09-07 DIAGNOSIS — N051 Unspecified nephritic syndrome with focal and segmental glomerular lesions: Secondary | ICD-10-CM | POA: Diagnosis not present

## 2014-09-07 DIAGNOSIS — R0602 Shortness of breath: Secondary | ICD-10-CM | POA: Diagnosis not present

## 2014-09-07 DIAGNOSIS — F17200 Nicotine dependence, unspecified, uncomplicated: Secondary | ICD-10-CM | POA: Diagnosis not present

## 2014-09-07 DIAGNOSIS — Z01818 Encounter for other preprocedural examination: Secondary | ICD-10-CM | POA: Diagnosis not present

## 2014-09-07 DIAGNOSIS — Z01810 Encounter for preprocedural cardiovascular examination: Secondary | ICD-10-CM | POA: Diagnosis not present

## 2014-09-07 DIAGNOSIS — N186 End stage renal disease: Secondary | ICD-10-CM | POA: Diagnosis not present

## 2014-09-07 DIAGNOSIS — I120 Hypertensive chronic kidney disease with stage 5 chronic kidney disease or end stage renal disease: Secondary | ICD-10-CM | POA: Diagnosis not present

## 2014-09-12 DIAGNOSIS — N186 End stage renal disease: Secondary | ICD-10-CM | POA: Diagnosis not present

## 2014-09-12 DIAGNOSIS — Z992 Dependence on renal dialysis: Secondary | ICD-10-CM | POA: Diagnosis not present

## 2014-10-06 ENCOUNTER — Encounter: Payer: Self-pay | Admitting: Vascular Surgery

## 2014-10-07 ENCOUNTER — Ambulatory Visit: Payer: 59 | Admitting: Vascular Surgery

## 2014-10-11 DIAGNOSIS — Z992 Dependence on renal dialysis: Secondary | ICD-10-CM | POA: Diagnosis not present

## 2014-10-11 DIAGNOSIS — N186 End stage renal disease: Secondary | ICD-10-CM | POA: Diagnosis not present

## 2014-10-12 DIAGNOSIS — G609 Hereditary and idiopathic neuropathy, unspecified: Secondary | ICD-10-CM | POA: Diagnosis not present

## 2014-10-12 DIAGNOSIS — B351 Tinea unguium: Secondary | ICD-10-CM | POA: Diagnosis not present

## 2014-10-12 DIAGNOSIS — S93402A Sprain of unspecified ligament of left ankle, initial encounter: Secondary | ICD-10-CM | POA: Diagnosis not present

## 2014-10-27 ENCOUNTER — Encounter: Payer: Self-pay | Admitting: Family

## 2014-10-28 ENCOUNTER — Encounter: Payer: Self-pay | Admitting: Vascular Surgery

## 2014-10-28 ENCOUNTER — Ambulatory Visit (INDEPENDENT_AMBULATORY_CARE_PROVIDER_SITE_OTHER): Payer: 59 | Admitting: Vascular Surgery

## 2014-10-28 VITALS — BP 135/76 | HR 96 | Ht 76.0 in | Wt 262.0 lb

## 2014-10-28 DIAGNOSIS — T82898D Other specified complication of vascular prosthetic devices, implants and grafts, subsequent encounter: Secondary | ICD-10-CM

## 2014-10-28 NOTE — Progress Notes (Signed)
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 The patient had a left brachiocephalic AV fistula placed by Dr. Scot Dock in May 2014. He subsequently had Superficialization and side branch ligation of this in November 2014 by Dr. Kellie Simmering. He has also previously had a left radiocephalic fistula which failed. He states that he has numbness and tingling in all 5 digits in his left hand. He states that after his radiocephalic AV fistula he did have numbness and tingling of digits 1 and 2. This is gotten worse since his new fistula. However, he does describe some numbness and tingling in his right hand as well although not as bad as his left. He does not have any nonhealing wounds or ulcerations or weakness in the left hand. The numbness and tingling occasionally gets worse on dialysis but this is not consistent. He currently is on a list to get a kidney transplant at [facility].  Review of systems: He denies fever or chills. He denies shortness of breath or chest pain.    Physical exam:     Filed Vitals:   10/28/14 0905  BP: 135/76  Pulse: 96  Height: 6\' 4"  (1.93 m)  Weight: 262 lb (118.842 kg)  SpO2: 98%   Left upper extremity: Well-healed incisions left forearm left upper arm palpable thrill and upper arm fistula hands are pink and warm and symmetric bilaterally. No palpable radial pulse bilaterally.  Musculoskeletal: No asymmetry in muscles left versus right hand Skin: No open ulcers or wounds  Assessment: Numbness and tingling left hand most likely multifactorial. 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 He also has a component of peripheral neuropathy.  He currently believes that his symptoms are tolerable and would prefer to not lose the fistula at this point. I did discuss with him the possibility of the numbness and tingling becoming permanent as well as the possibility of ulcerations or nonhealing wounds on his  hand. He is going to continue to observe this for now. If he notices atrophy of the muscles in his hand or development of ulcers on his fingers he will return for ligation of the fistula.   [name], MD [facility] [place] Office: [phone] Pager: [phone]

## 2014-11-02 DIAGNOSIS — G609 Hereditary and idiopathic neuropathy, unspecified: Secondary | ICD-10-CM | POA: Diagnosis not present

## 2014-11-02 DIAGNOSIS — S93402A Sprain of unspecified ligament of left ankle, initial encounter: Secondary | ICD-10-CM | POA: Diagnosis not present

## 2014-11-04 DIAGNOSIS — R262 Difficulty in walking, not elsewhere classified: Secondary | ICD-10-CM | POA: Diagnosis not present

## 2014-11-04 DIAGNOSIS — S93402D Sprain of unspecified ligament of left ankle, subsequent encounter: Secondary | ICD-10-CM | POA: Diagnosis not present

## 2014-11-04 DIAGNOSIS — M25572 Pain in left ankle and joints of left foot: Secondary | ICD-10-CM | POA: Diagnosis not present

## 2014-11-04 DIAGNOSIS — M6281 Muscle weakness (generalized): Secondary | ICD-10-CM | POA: Diagnosis not present

## 2014-11-08 ENCOUNTER — Encounter (INDEPENDENT_AMBULATORY_CARE_PROVIDER_SITE_OTHER): Payer: Self-pay | Admitting: *Deleted

## 2014-11-09 DIAGNOSIS — M25572 Pain in left ankle and joints of left foot: Secondary | ICD-10-CM | POA: Diagnosis not present

## 2014-11-09 DIAGNOSIS — S93402D Sprain of unspecified ligament of left ankle, subsequent encounter: Secondary | ICD-10-CM | POA: Diagnosis not present

## 2014-11-09 DIAGNOSIS — R262 Difficulty in walking, not elsewhere classified: Secondary | ICD-10-CM | POA: Diagnosis not present

## 2014-11-09 DIAGNOSIS — M6281 Muscle weakness (generalized): Secondary | ICD-10-CM | POA: Diagnosis not present

## 2014-11-11 DIAGNOSIS — N186 End stage renal disease: Secondary | ICD-10-CM | POA: Diagnosis not present

## 2014-11-11 DIAGNOSIS — Z992 Dependence on renal dialysis: Secondary | ICD-10-CM | POA: Diagnosis not present

## 2014-11-11 DIAGNOSIS — E1129 Type 2 diabetes mellitus with other diabetic kidney complication: Secondary | ICD-10-CM | POA: Diagnosis not present

## 2014-11-16 DIAGNOSIS — R262 Difficulty in walking, not elsewhere classified: Secondary | ICD-10-CM | POA: Diagnosis not present

## 2014-11-16 DIAGNOSIS — S93402D Sprain of unspecified ligament of left ankle, subsequent encounter: Secondary | ICD-10-CM | POA: Diagnosis not present

## 2014-11-16 DIAGNOSIS — M25572 Pain in left ankle and joints of left foot: Secondary | ICD-10-CM | POA: Diagnosis not present

## 2014-11-16 DIAGNOSIS — M6281 Muscle weakness (generalized): Secondary | ICD-10-CM | POA: Diagnosis not present

## 2014-11-25 DIAGNOSIS — R262 Difficulty in walking, not elsewhere classified: Secondary | ICD-10-CM | POA: Diagnosis not present

## 2014-11-25 DIAGNOSIS — M25572 Pain in left ankle and joints of left foot: Secondary | ICD-10-CM | POA: Diagnosis not present

## 2014-11-25 DIAGNOSIS — S93402D Sprain of unspecified ligament of left ankle, subsequent encounter: Secondary | ICD-10-CM | POA: Diagnosis not present

## 2014-11-25 DIAGNOSIS — M6281 Muscle weakness (generalized): Secondary | ICD-10-CM | POA: Diagnosis not present

## 2014-11-28 DIAGNOSIS — S53411A Radiohumeral (joint) sprain of right elbow, initial encounter: Secondary | ICD-10-CM | POA: Insufficient documentation

## 2014-11-28 DIAGNOSIS — S80811A Abrasion, right lower leg, initial encounter: Secondary | ICD-10-CM | POA: Insufficient documentation

## 2014-11-28 DIAGNOSIS — S76011A Strain of muscle, fascia and tendon of right hip, initial encounter: Secondary | ICD-10-CM | POA: Insufficient documentation

## 2014-11-28 DIAGNOSIS — S40811A Abrasion of right upper arm, initial encounter: Secondary | ICD-10-CM | POA: Insufficient documentation

## 2014-11-29 DIAGNOSIS — M25421 Effusion, right elbow: Secondary | ICD-10-CM | POA: Diagnosis not present

## 2014-11-29 DIAGNOSIS — M25521 Pain in right elbow: Secondary | ICD-10-CM | POA: Diagnosis not present

## 2014-11-29 DIAGNOSIS — M19021 Primary osteoarthritis, right elbow: Secondary | ICD-10-CM | POA: Diagnosis not present

## 2014-11-29 DIAGNOSIS — M25551 Pain in right hip: Secondary | ICD-10-CM | POA: Diagnosis not present

## 2014-11-30 DIAGNOSIS — M25572 Pain in left ankle and joints of left foot: Secondary | ICD-10-CM | POA: Diagnosis not present

## 2014-11-30 DIAGNOSIS — M6281 Muscle weakness (generalized): Secondary | ICD-10-CM | POA: Diagnosis not present

## 2014-11-30 DIAGNOSIS — R262 Difficulty in walking, not elsewhere classified: Secondary | ICD-10-CM | POA: Diagnosis not present

## 2014-11-30 DIAGNOSIS — S93402D Sprain of unspecified ligament of left ankle, subsequent encounter: Secondary | ICD-10-CM | POA: Diagnosis not present

## 2014-12-06 DIAGNOSIS — R531 Weakness: Secondary | ICD-10-CM | POA: Diagnosis not present

## 2014-12-06 DIAGNOSIS — R404 Transient alteration of awareness: Secondary | ICD-10-CM | POA: Diagnosis not present

## 2014-12-20 DIAGNOSIS — S40011A Contusion of right shoulder, initial encounter: Secondary | ICD-10-CM | POA: Insufficient documentation

## 2014-12-20 DIAGNOSIS — S5001XA Contusion of right elbow, initial encounter: Secondary | ICD-10-CM | POA: Insufficient documentation

## 2014-12-20 DIAGNOSIS — S7001XA Contusion of right hip, initial encounter: Secondary | ICD-10-CM | POA: Insufficient documentation

## 2015-01-11 ENCOUNTER — Encounter: Payer: Self-pay | Admitting: Vascular Surgery

## 2015-01-13 ENCOUNTER — Ambulatory Visit (HOSPITAL_COMMUNITY)
Admission: RE | Admit: 2015-01-13 | Discharge: 2015-01-13 | Disposition: A | Discharge status: Home / Self Care | Payer: 59 | Source: Ambulatory Visit | Attending: Vascular Surgery | Admitting: Vascular Surgery

## 2015-01-13 ENCOUNTER — Ambulatory Visit (INDEPENDENT_AMBULATORY_CARE_PROVIDER_SITE_OTHER): Payer: 59 | Admitting: Vascular Surgery

## 2015-01-13 ENCOUNTER — Encounter: Payer: Self-pay | Admitting: Vascular Surgery

## 2015-01-13 VITALS — BP 153/93 | HR 108 | Temp 98.2°F | Resp 18 | Ht 76.0 in | Wt 264.9 lb

## 2015-01-13 DIAGNOSIS — T82898D Other specified complication of vascular prosthetic devices, implants and grafts, subsequent encounter: Secondary | ICD-10-CM | POA: Diagnosis not present

## 2015-01-13 DIAGNOSIS — N186 End stage renal disease: Secondary | ICD-10-CM | POA: Insufficient documentation

## 2015-01-13 DIAGNOSIS — I739 Peripheral vascular disease, unspecified: Secondary | ICD-10-CM | POA: Diagnosis not present

## 2015-01-13 NOTE — Progress Notes (Signed)
Established Patient  History of Present Illness  [name] is a 64 y.o. ([birth year]) male who presents with chief complaint: Bilateral leg and feet tiredness.  The patient's symptoms have not progressed.  The patient's symptoms are: Getting tired after walking more than 2/10 of a mile.  He is walking daily and trying to increase his tolerance.  He present today for repeat ABI's.  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  he also has had symptoms of steal from his left upper arm AV fistula and has been evaluated for this in the past. He states that she is still having some pain in his left hand but denies any development of ulceration in the fingertips. He states that the pain is tolerable and he does not wish to have a different access placed currently. He did develop a small ulceration on the midportion of the fistula and they are avoiding sticking this area. He has had several falls recently. He has a neurology evaluation pending. Chronic medical problems remain COPD hypertension diabetes and peripheral neuropathy.    Past Surgical History   Procedure  Laterality  Date   .  Inguinal hernia repair           ,  times   2   .  Tonsillectomy       .  Av fistula placement    2012          left arm    .  Colonoscopy    10/26/2011       Procedure: COLONOSCOPY;  Surgeon: Rogene Houston, MD;  [facility];  Service: Endoscopy;  Laterality: N/A;  730   .  Knee arthroscopy    2011       Right Knee   .  Thrombectomy w/ embolectomy  Left  12/11/2012       Procedure: THROMBECTOMY ARTERIOVENOUS FISTULA;  Surgeon: Serafina Mitchell, MD;  Location: [place];  Service: Vascular;  Laterality: Left;   .  Av fistula placement  Left  12/18/2012       Procedure: ARTERIOVENOUS (AV) FISTULA CREATION;  Surgeon: Angelia Mould, MD;  Location: [place];  Service: Vascular;  Laterality: Left;   .  Insertion of dialysis catheter  Left  12/18/2012       Procedure: INSERTION OF  DIALYSIS CATHETER;  Surgeon: Angelia Mould, MD;  Location: [facility] OR;  Service: Vascular;  Laterality: Left;   .  Esophagogastroduodenoscopy (egd) with esophageal dilation  N/A  04/23/2013       Procedure: ESOPHAGOGASTRODUODENOSCOPY (EGD) WITH ESOPHAGEAL DILATION;  Surgeon: Rogene Houston, MD;  [facility];  Service: Endoscopy;  Laterality: N/A;  200-moved to 930    .  Fistula superficialization  Left  06/18/2013       Procedure: FISTULA SUPERFICIALIZATION & LIGATION BRANCH X 1;  Surgeon: Mal Misty, MD;  Location: [place];  Service: Vascular;  Laterality: Left;    Past Medical History  Diagnosis Date  . Anemia   . Chronic kidney disease   . Anxiety   . Depression   . COPD (chronic obstructive pulmonary disease)     Pt denies  . Pneumonia     Hx: of  . GERD (gastroesophageal reflux disease)   . H/O hiatal hernia   . Headache(784.0)     Hx: Migraines  . Arthritis   . Numbness of toes     toes  and feet  . Diabetes mellitus without complication   . Hypertension   . Renal insufficiency     Outpatient Encounter Prescriptions as of 07/15/2014   Medication  Sig  Note   .  acetaminophen (TYLENOL) 500 MG tablet  Take 1,000 mg by mouth every 6 (six) hours as needed for pain.     .  B Complex-C-Zn-Folic Acid (DIALYVITE/ZINC PO)  Take 1 tablet by mouth daily.     .  carvedilol (COREG) 6.25 MG tablet    07/15/2014: Received from: External Pharmacy   .  cinacalcet (SENSIPAR) 30 MG tablet  Take 30 mg by mouth daily.     .  cyclobenzaprine (FLEXERIL) 10 MG tablet  Take 10 mg by mouth 3 (three) times daily as needed for muscle spasms.     .  diazepam (VALIUM) 10 MG tablet  Take 10 mg by mouth 3 (three) times daily.     .  diphenhydrAMINE (BENADRYL) 25 mg capsule  Take 25 mg by mouth every 6 (six) hours as needed for itching.     Marland Kitchen  FLUoxetine (PROZAC) 20 MG capsule  Take 20 mg by mouth daily.     Marland Kitchen  gabapentin (NEURONTIN) 100 MG capsule  Take 100 mg by mouth 3 (three) times daily.       .  Insulin Glargine (TOUJEO SOLOSTAR) 300 UNIT/ML SOPN  Inject into the skin.     .  Iron Sucrose (VENOFER IV)  Inject into the vein. 50mg  once a week on Wednesday.     Marland Kitchen  lanthanum (FOSRENOL) 1000 MG chewable tablet  Chew 1,000 mg by mouth 2 (two) times daily with a meal.      .  lanthanum (FOSRENOL) 1000 MG chewable tablet  Chew 1,000 mg by mouth 2 (two) times daily with a meal.     .  Melatonin 10 MG TABS  Take 10 mg by mouth at bedtime.     .  NONFORMULARY OR COMPOUNDED ITEM  10 mg daily. Domperidone  06/02/2013: compounded by [employer], [address], [place], [place] [address] for [facility], [place], [place]    .  oxyCODONE (OXY IR/ROXICODONE) 5 MG immediate release tablet  Take 2 tablets (10 mg total) by mouth 4 (four) times daily.     .  polyethylene glycol (MIRALAX / GLYCOLAX) packet  Take 17 g by mouth daily as needed (constipation).      .  polyvinyl alcohol (LIQUIFILM TEARS) 1.4 % ophthalmic solution  Place 1-2 drops into both eyes as needed (for dry eyes).     .  Probiotic Product (PHILLIPS COLON HEALTH PO)  Take 1 capsule by mouth daily as needed (constipation).     .  ranitidine (ZANTAC) 150 MG tablet  Take 150 mg by mouth daily as needed for heartburn.     .  Sevelamer Carbonate (RENVELA PO)  Take 2-4 tablets by mouth See admin instructions. 3-4 tabs daily three times daily with meals (depending on what he eats) and 2 tabs daily with snacks.     .  simvastatin (ZOCOR) 40 MG tablet  Take 40 mg by mouth every evening.     .  temazepam (RESTORIL) 30 MG capsule    07/15/2014: Received from: External Pharmacy      Review of Systems  Musculoskeletal: Positive for back pain.  Neurological: Positive for tingling. Negative for loss of consciousness and headaches.  Endo/Heme/Allergies: Does not bruise/bleed easily.  Psychiatric/Behavioral: The patient is nervous/anxious.  Physical Examination    Filed Vitals:   01/13/15 1243  BP: 153/93  Pulse: 108  Temp: 98.2 F (36.8  C)  TempSrc: Oral  Resp: 18  Height: 6\' 4"  (1.93 m)  Weight: 120.158 kg (264 lb 14.4 oz)  SpO2: 97%    General: A&O x 3, WD  Extremities: Palpable thrill left upper arm AV fistula, 2+ left radial pulse, no palpable pedal pulses  Skin: Healing laceration right fifth toe webspace from recent fall  Musculoskeletal: M/S 5/5 throughout , Extremities without ischemic changes   Neurologic: Pain and light touch intact in extremities  Non-Invasive Vascular Imaging ABIs performed today were reviewed and interpreted by me. ABI on the left was 1.00 with triphasic biphasic waveforms right was 1.29 with triphasic biphasic waveforms slightly improved from December 2015  Assessment: #1 PAD. Patient has easy fatigue of both lower extremities after walking one quarter of a mile. He does not really describe claudication. He has no rest pain. His ABIs of been stable.  I would favor continued conservative management for now. If the patient develops claudication symptoms are more severe symptoms in the left lower extremity with consider arteriogram otherwise we will continue intermittent checks to make sure that he is not deteriorating. He will call me if the wound between his right fourth and fifth toe has not healed within one month.  Left hand pain most likely due to some element of steal or neuropathy. He does have a palpable radial pulse. I again discussed with him today the possibility of ligating the fistula and place a new access. He is not currently interested in this. I did discuss with him the fact that this could become permanent without intervention. He will call me if he changes his mind regarding this. Otherwise the patient will follow-up on as-needed basis.  [name], MD [facility] [place] Office: [phone] Pager: [phone]

## 2015-01-20 DIAGNOSIS — M629 Disorder of muscle, unspecified: Secondary | ICD-10-CM | POA: Diagnosis not present

## 2015-01-20 DIAGNOSIS — M6281 Muscle weakness (generalized): Secondary | ICD-10-CM | POA: Diagnosis not present

## 2015-02-03 DIAGNOSIS — I739 Peripheral vascular disease, unspecified: Secondary | ICD-10-CM | POA: Diagnosis not present

## 2015-02-03 DIAGNOSIS — N3289 Other specified disorders of bladder: Secondary | ICD-10-CM | POA: Diagnosis not present

## 2015-02-03 DIAGNOSIS — I771 Stricture of artery: Secondary | ICD-10-CM | POA: Diagnosis not present

## 2015-02-09 DIAGNOSIS — G9009 Other idiopathic peripheral autonomic neuropathy: Secondary | ICD-10-CM | POA: Insufficient documentation

## 2015-02-11 DIAGNOSIS — D631 Anemia in chronic kidney disease: Secondary | ICD-10-CM | POA: Diagnosis not present

## 2015-02-11 DIAGNOSIS — N186 End stage renal disease: Secondary | ICD-10-CM | POA: Diagnosis not present

## 2015-02-11 DIAGNOSIS — E1129 Type 2 diabetes mellitus with other diabetic kidney complication: Secondary | ICD-10-CM | POA: Diagnosis not present

## 2015-02-11 DIAGNOSIS — N2581 Secondary hyperparathyroidism of renal origin: Secondary | ICD-10-CM | POA: Diagnosis not present

## 2015-02-11 DIAGNOSIS — B999 Unspecified infectious disease: Secondary | ICD-10-CM | POA: Diagnosis not present

## 2015-02-14 DIAGNOSIS — N2581 Secondary hyperparathyroidism of renal origin: Secondary | ICD-10-CM | POA: Diagnosis not present

## 2015-02-14 DIAGNOSIS — E1129 Type 2 diabetes mellitus with other diabetic kidney complication: Secondary | ICD-10-CM | POA: Diagnosis not present

## 2015-02-14 DIAGNOSIS — B999 Unspecified infectious disease: Secondary | ICD-10-CM | POA: Diagnosis not present

## 2015-02-14 DIAGNOSIS — N186 End stage renal disease: Secondary | ICD-10-CM | POA: Diagnosis not present

## 2015-02-14 DIAGNOSIS — D631 Anemia in chronic kidney disease: Secondary | ICD-10-CM | POA: Diagnosis not present

## 2015-02-16 DIAGNOSIS — D631 Anemia in chronic kidney disease: Secondary | ICD-10-CM | POA: Diagnosis not present

## 2015-02-16 DIAGNOSIS — N186 End stage renal disease: Secondary | ICD-10-CM | POA: Diagnosis not present

## 2015-02-16 DIAGNOSIS — E1129 Type 2 diabetes mellitus with other diabetic kidney complication: Secondary | ICD-10-CM | POA: Diagnosis not present

## 2015-02-16 DIAGNOSIS — N2581 Secondary hyperparathyroidism of renal origin: Secondary | ICD-10-CM | POA: Diagnosis not present

## 2015-02-16 DIAGNOSIS — B999 Unspecified infectious disease: Secondary | ICD-10-CM | POA: Diagnosis not present

## 2015-02-18 DIAGNOSIS — D631 Anemia in chronic kidney disease: Secondary | ICD-10-CM | POA: Diagnosis not present

## 2015-02-18 DIAGNOSIS — E1129 Type 2 diabetes mellitus with other diabetic kidney complication: Secondary | ICD-10-CM | POA: Diagnosis not present

## 2015-02-18 DIAGNOSIS — N2581 Secondary hyperparathyroidism of renal origin: Secondary | ICD-10-CM | POA: Diagnosis not present

## 2015-02-18 DIAGNOSIS — N186 End stage renal disease: Secondary | ICD-10-CM | POA: Diagnosis not present

## 2015-02-18 DIAGNOSIS — B999 Unspecified infectious disease: Secondary | ICD-10-CM | POA: Diagnosis not present

## 2015-02-21 DIAGNOSIS — B999 Unspecified infectious disease: Secondary | ICD-10-CM | POA: Diagnosis not present

## 2015-02-21 DIAGNOSIS — N186 End stage renal disease: Secondary | ICD-10-CM | POA: Diagnosis not present

## 2015-02-21 DIAGNOSIS — N2581 Secondary hyperparathyroidism of renal origin: Secondary | ICD-10-CM | POA: Diagnosis not present

## 2015-02-21 DIAGNOSIS — E1129 Type 2 diabetes mellitus with other diabetic kidney complication: Secondary | ICD-10-CM | POA: Diagnosis not present

## 2015-02-21 DIAGNOSIS — D631 Anemia in chronic kidney disease: Secondary | ICD-10-CM | POA: Diagnosis not present

## 2015-02-23 DIAGNOSIS — B999 Unspecified infectious disease: Secondary | ICD-10-CM | POA: Diagnosis not present

## 2015-02-23 DIAGNOSIS — D631 Anemia in chronic kidney disease: Secondary | ICD-10-CM | POA: Diagnosis not present

## 2015-02-23 DIAGNOSIS — N186 End stage renal disease: Secondary | ICD-10-CM | POA: Diagnosis not present

## 2015-02-23 DIAGNOSIS — N2581 Secondary hyperparathyroidism of renal origin: Secondary | ICD-10-CM | POA: Diagnosis not present

## 2015-02-23 DIAGNOSIS — E1129 Type 2 diabetes mellitus with other diabetic kidney complication: Secondary | ICD-10-CM | POA: Diagnosis not present

## 2015-02-25 DIAGNOSIS — N2581 Secondary hyperparathyroidism of renal origin: Secondary | ICD-10-CM | POA: Diagnosis not present

## 2015-02-25 DIAGNOSIS — B999 Unspecified infectious disease: Secondary | ICD-10-CM | POA: Diagnosis not present

## 2015-02-25 DIAGNOSIS — E1129 Type 2 diabetes mellitus with other diabetic kidney complication: Secondary | ICD-10-CM | POA: Diagnosis not present

## 2015-02-25 DIAGNOSIS — N186 End stage renal disease: Secondary | ICD-10-CM | POA: Diagnosis not present

## 2015-02-25 DIAGNOSIS — D631 Anemia in chronic kidney disease: Secondary | ICD-10-CM | POA: Diagnosis not present

## 2015-02-28 DIAGNOSIS — D631 Anemia in chronic kidney disease: Secondary | ICD-10-CM | POA: Diagnosis not present

## 2015-02-28 DIAGNOSIS — E1129 Type 2 diabetes mellitus with other diabetic kidney complication: Secondary | ICD-10-CM | POA: Diagnosis not present

## 2015-02-28 DIAGNOSIS — N186 End stage renal disease: Secondary | ICD-10-CM | POA: Diagnosis not present

## 2015-02-28 DIAGNOSIS — B999 Unspecified infectious disease: Secondary | ICD-10-CM | POA: Diagnosis not present

## 2015-02-28 DIAGNOSIS — N2581 Secondary hyperparathyroidism of renal origin: Secondary | ICD-10-CM | POA: Diagnosis not present

## 2015-03-02 DIAGNOSIS — N186 End stage renal disease: Secondary | ICD-10-CM | POA: Diagnosis not present

## 2015-03-02 DIAGNOSIS — E1129 Type 2 diabetes mellitus with other diabetic kidney complication: Secondary | ICD-10-CM | POA: Diagnosis not present

## 2015-03-02 DIAGNOSIS — B999 Unspecified infectious disease: Secondary | ICD-10-CM | POA: Diagnosis not present

## 2015-03-02 DIAGNOSIS — D631 Anemia in chronic kidney disease: Secondary | ICD-10-CM | POA: Diagnosis not present

## 2015-03-02 DIAGNOSIS — N2581 Secondary hyperparathyroidism of renal origin: Secondary | ICD-10-CM | POA: Diagnosis not present

## 2015-03-04 DIAGNOSIS — B999 Unspecified infectious disease: Secondary | ICD-10-CM | POA: Diagnosis not present

## 2015-03-04 DIAGNOSIS — N2581 Secondary hyperparathyroidism of renal origin: Secondary | ICD-10-CM | POA: Diagnosis not present

## 2015-03-04 DIAGNOSIS — N186 End stage renal disease: Secondary | ICD-10-CM | POA: Diagnosis not present

## 2015-03-04 DIAGNOSIS — D631 Anemia in chronic kidney disease: Secondary | ICD-10-CM | POA: Diagnosis not present

## 2015-03-04 DIAGNOSIS — E1129 Type 2 diabetes mellitus with other diabetic kidney complication: Secondary | ICD-10-CM | POA: Diagnosis not present

## 2015-03-07 DIAGNOSIS — E1129 Type 2 diabetes mellitus with other diabetic kidney complication: Secondary | ICD-10-CM | POA: Diagnosis not present

## 2015-03-07 DIAGNOSIS — B999 Unspecified infectious disease: Secondary | ICD-10-CM | POA: Diagnosis not present

## 2015-03-07 DIAGNOSIS — N186 End stage renal disease: Secondary | ICD-10-CM | POA: Diagnosis not present

## 2015-03-07 DIAGNOSIS — N2581 Secondary hyperparathyroidism of renal origin: Secondary | ICD-10-CM | POA: Diagnosis not present

## 2015-03-07 DIAGNOSIS — D631 Anemia in chronic kidney disease: Secondary | ICD-10-CM | POA: Diagnosis not present

## 2015-03-09 DIAGNOSIS — N2581 Secondary hyperparathyroidism of renal origin: Secondary | ICD-10-CM | POA: Diagnosis not present

## 2015-03-09 DIAGNOSIS — B999 Unspecified infectious disease: Secondary | ICD-10-CM | POA: Diagnosis not present

## 2015-03-09 DIAGNOSIS — N186 End stage renal disease: Secondary | ICD-10-CM | POA: Diagnosis not present

## 2015-03-09 DIAGNOSIS — E1129 Type 2 diabetes mellitus with other diabetic kidney complication: Secondary | ICD-10-CM | POA: Diagnosis not present

## 2015-03-09 DIAGNOSIS — D631 Anemia in chronic kidney disease: Secondary | ICD-10-CM | POA: Diagnosis not present

## 2015-03-11 DIAGNOSIS — B999 Unspecified infectious disease: Secondary | ICD-10-CM | POA: Diagnosis not present

## 2015-03-11 DIAGNOSIS — D631 Anemia in chronic kidney disease: Secondary | ICD-10-CM | POA: Diagnosis not present

## 2015-03-11 DIAGNOSIS — N186 End stage renal disease: Secondary | ICD-10-CM | POA: Diagnosis not present

## 2015-03-11 DIAGNOSIS — E1129 Type 2 diabetes mellitus with other diabetic kidney complication: Secondary | ICD-10-CM | POA: Diagnosis not present

## 2015-03-11 DIAGNOSIS — N2581 Secondary hyperparathyroidism of renal origin: Secondary | ICD-10-CM | POA: Diagnosis not present

## 2015-03-13 DIAGNOSIS — Z992 Dependence on renal dialysis: Secondary | ICD-10-CM | POA: Diagnosis not present

## 2015-03-13 DIAGNOSIS — N186 End stage renal disease: Secondary | ICD-10-CM | POA: Diagnosis not present

## 2015-03-13 DIAGNOSIS — E1129 Type 2 diabetes mellitus with other diabetic kidney complication: Secondary | ICD-10-CM | POA: Diagnosis not present

## 2015-03-14 DIAGNOSIS — D631 Anemia in chronic kidney disease: Secondary | ICD-10-CM | POA: Diagnosis not present

## 2015-03-14 DIAGNOSIS — E1129 Type 2 diabetes mellitus with other diabetic kidney complication: Secondary | ICD-10-CM | POA: Diagnosis not present

## 2015-03-14 DIAGNOSIS — N186 End stage renal disease: Secondary | ICD-10-CM | POA: Diagnosis not present

## 2015-03-14 DIAGNOSIS — N2581 Secondary hyperparathyroidism of renal origin: Secondary | ICD-10-CM | POA: Diagnosis not present

## 2015-03-16 DIAGNOSIS — N2581 Secondary hyperparathyroidism of renal origin: Secondary | ICD-10-CM | POA: Diagnosis not present

## 2015-03-16 DIAGNOSIS — N186 End stage renal disease: Secondary | ICD-10-CM | POA: Diagnosis not present

## 2015-03-16 DIAGNOSIS — D631 Anemia in chronic kidney disease: Secondary | ICD-10-CM | POA: Diagnosis not present

## 2015-03-16 DIAGNOSIS — E1129 Type 2 diabetes mellitus with other diabetic kidney complication: Secondary | ICD-10-CM | POA: Diagnosis not present

## 2015-03-18 DIAGNOSIS — N186 End stage renal disease: Secondary | ICD-10-CM | POA: Diagnosis not present

## 2015-03-18 DIAGNOSIS — D631 Anemia in chronic kidney disease: Secondary | ICD-10-CM | POA: Diagnosis not present

## 2015-03-18 DIAGNOSIS — E1129 Type 2 diabetes mellitus with other diabetic kidney complication: Secondary | ICD-10-CM | POA: Diagnosis not present

## 2015-03-18 DIAGNOSIS — N2581 Secondary hyperparathyroidism of renal origin: Secondary | ICD-10-CM | POA: Diagnosis not present

## 2015-03-21 DIAGNOSIS — N186 End stage renal disease: Secondary | ICD-10-CM | POA: Diagnosis not present

## 2015-03-21 DIAGNOSIS — N2581 Secondary hyperparathyroidism of renal origin: Secondary | ICD-10-CM | POA: Diagnosis not present

## 2015-03-21 DIAGNOSIS — D631 Anemia in chronic kidney disease: Secondary | ICD-10-CM | POA: Diagnosis not present

## 2015-03-21 DIAGNOSIS — E1129 Type 2 diabetes mellitus with other diabetic kidney complication: Secondary | ICD-10-CM | POA: Diagnosis not present

## 2015-03-23 DIAGNOSIS — E1129 Type 2 diabetes mellitus with other diabetic kidney complication: Secondary | ICD-10-CM | POA: Diagnosis not present

## 2015-03-23 DIAGNOSIS — N2581 Secondary hyperparathyroidism of renal origin: Secondary | ICD-10-CM | POA: Diagnosis not present

## 2015-03-23 DIAGNOSIS — D631 Anemia in chronic kidney disease: Secondary | ICD-10-CM | POA: Diagnosis not present

## 2015-03-23 DIAGNOSIS — N186 End stage renal disease: Secondary | ICD-10-CM | POA: Diagnosis not present

## 2015-03-25 DIAGNOSIS — D631 Anemia in chronic kidney disease: Secondary | ICD-10-CM | POA: Diagnosis not present

## 2015-03-25 DIAGNOSIS — E1129 Type 2 diabetes mellitus with other diabetic kidney complication: Secondary | ICD-10-CM | POA: Diagnosis not present

## 2015-03-25 DIAGNOSIS — N2581 Secondary hyperparathyroidism of renal origin: Secondary | ICD-10-CM | POA: Diagnosis not present

## 2015-03-25 DIAGNOSIS — N186 End stage renal disease: Secondary | ICD-10-CM | POA: Diagnosis not present

## 2015-03-28 DIAGNOSIS — D631 Anemia in chronic kidney disease: Secondary | ICD-10-CM | POA: Diagnosis not present

## 2015-03-28 DIAGNOSIS — N2581 Secondary hyperparathyroidism of renal origin: Secondary | ICD-10-CM | POA: Diagnosis not present

## 2015-03-28 DIAGNOSIS — N186 End stage renal disease: Secondary | ICD-10-CM | POA: Diagnosis not present

## 2015-03-28 DIAGNOSIS — E1129 Type 2 diabetes mellitus with other diabetic kidney complication: Secondary | ICD-10-CM | POA: Diagnosis not present

## 2015-03-30 DIAGNOSIS — N186 End stage renal disease: Secondary | ICD-10-CM | POA: Diagnosis not present

## 2015-03-30 DIAGNOSIS — D631 Anemia in chronic kidney disease: Secondary | ICD-10-CM | POA: Diagnosis not present

## 2015-03-30 DIAGNOSIS — N2581 Secondary hyperparathyroidism of renal origin: Secondary | ICD-10-CM | POA: Diagnosis not present

## 2015-03-30 DIAGNOSIS — E1129 Type 2 diabetes mellitus with other diabetic kidney complication: Secondary | ICD-10-CM | POA: Diagnosis not present

## 2015-04-01 DIAGNOSIS — N2581 Secondary hyperparathyroidism of renal origin: Secondary | ICD-10-CM | POA: Diagnosis not present

## 2015-04-01 DIAGNOSIS — N186 End stage renal disease: Secondary | ICD-10-CM | POA: Diagnosis not present

## 2015-04-01 DIAGNOSIS — D631 Anemia in chronic kidney disease: Secondary | ICD-10-CM | POA: Diagnosis not present

## 2015-04-01 DIAGNOSIS — E1129 Type 2 diabetes mellitus with other diabetic kidney complication: Secondary | ICD-10-CM | POA: Diagnosis not present

## 2015-04-04 DIAGNOSIS — E1129 Type 2 diabetes mellitus with other diabetic kidney complication: Secondary | ICD-10-CM | POA: Diagnosis not present

## 2015-04-04 DIAGNOSIS — D631 Anemia in chronic kidney disease: Secondary | ICD-10-CM | POA: Diagnosis not present

## 2015-04-04 DIAGNOSIS — N186 End stage renal disease: Secondary | ICD-10-CM | POA: Diagnosis not present

## 2015-04-04 DIAGNOSIS — N2581 Secondary hyperparathyroidism of renal origin: Secondary | ICD-10-CM | POA: Diagnosis not present

## 2015-04-06 DIAGNOSIS — N2581 Secondary hyperparathyroidism of renal origin: Secondary | ICD-10-CM | POA: Diagnosis not present

## 2015-04-06 DIAGNOSIS — D631 Anemia in chronic kidney disease: Secondary | ICD-10-CM | POA: Diagnosis not present

## 2015-04-06 DIAGNOSIS — E1129 Type 2 diabetes mellitus with other diabetic kidney complication: Secondary | ICD-10-CM | POA: Diagnosis not present

## 2015-04-06 DIAGNOSIS — N186 End stage renal disease: Secondary | ICD-10-CM | POA: Diagnosis not present

## 2015-04-08 DIAGNOSIS — E1129 Type 2 diabetes mellitus with other diabetic kidney complication: Secondary | ICD-10-CM | POA: Diagnosis not present

## 2015-04-08 DIAGNOSIS — N2581 Secondary hyperparathyroidism of renal origin: Secondary | ICD-10-CM | POA: Diagnosis not present

## 2015-04-08 DIAGNOSIS — N186 End stage renal disease: Secondary | ICD-10-CM | POA: Diagnosis not present

## 2015-04-08 DIAGNOSIS — D631 Anemia in chronic kidney disease: Secondary | ICD-10-CM | POA: Diagnosis not present

## 2015-04-11 DIAGNOSIS — N186 End stage renal disease: Secondary | ICD-10-CM | POA: Diagnosis not present

## 2015-04-11 DIAGNOSIS — E1129 Type 2 diabetes mellitus with other diabetic kidney complication: Secondary | ICD-10-CM | POA: Diagnosis not present

## 2015-04-11 DIAGNOSIS — D631 Anemia in chronic kidney disease: Secondary | ICD-10-CM | POA: Diagnosis not present

## 2015-04-11 DIAGNOSIS — N2581 Secondary hyperparathyroidism of renal origin: Secondary | ICD-10-CM | POA: Diagnosis not present

## 2015-04-13 DIAGNOSIS — D631 Anemia in chronic kidney disease: Secondary | ICD-10-CM | POA: Diagnosis not present

## 2015-04-13 DIAGNOSIS — N2581 Secondary hyperparathyroidism of renal origin: Secondary | ICD-10-CM | POA: Diagnosis not present

## 2015-04-13 DIAGNOSIS — N186 End stage renal disease: Secondary | ICD-10-CM | POA: Diagnosis not present

## 2015-04-13 DIAGNOSIS — Z992 Dependence on renal dialysis: Secondary | ICD-10-CM | POA: Diagnosis not present

## 2015-04-13 DIAGNOSIS — E1129 Type 2 diabetes mellitus with other diabetic kidney complication: Secondary | ICD-10-CM | POA: Diagnosis not present

## 2015-04-15 DIAGNOSIS — N2581 Secondary hyperparathyroidism of renal origin: Secondary | ICD-10-CM | POA: Diagnosis not present

## 2015-04-15 DIAGNOSIS — N186 End stage renal disease: Secondary | ICD-10-CM | POA: Diagnosis not present

## 2015-04-15 DIAGNOSIS — E1129 Type 2 diabetes mellitus with other diabetic kidney complication: Secondary | ICD-10-CM | POA: Diagnosis not present

## 2015-04-15 DIAGNOSIS — D631 Anemia in chronic kidney disease: Secondary | ICD-10-CM | POA: Diagnosis not present

## 2015-04-18 DIAGNOSIS — D631 Anemia in chronic kidney disease: Secondary | ICD-10-CM | POA: Diagnosis not present

## 2015-04-18 DIAGNOSIS — N186 End stage renal disease: Secondary | ICD-10-CM | POA: Diagnosis not present

## 2015-04-18 DIAGNOSIS — E1129 Type 2 diabetes mellitus with other diabetic kidney complication: Secondary | ICD-10-CM | POA: Diagnosis not present

## 2015-04-18 DIAGNOSIS — N2581 Secondary hyperparathyroidism of renal origin: Secondary | ICD-10-CM | POA: Diagnosis not present

## 2015-04-20 DIAGNOSIS — E1129 Type 2 diabetes mellitus with other diabetic kidney complication: Secondary | ICD-10-CM | POA: Diagnosis not present

## 2015-04-20 DIAGNOSIS — N186 End stage renal disease: Secondary | ICD-10-CM | POA: Diagnosis not present

## 2015-04-20 DIAGNOSIS — N2581 Secondary hyperparathyroidism of renal origin: Secondary | ICD-10-CM | POA: Diagnosis not present

## 2015-04-20 DIAGNOSIS — D631 Anemia in chronic kidney disease: Secondary | ICD-10-CM | POA: Diagnosis not present

## 2015-04-22 DIAGNOSIS — N2581 Secondary hyperparathyroidism of renal origin: Secondary | ICD-10-CM | POA: Diagnosis not present

## 2015-04-22 DIAGNOSIS — D631 Anemia in chronic kidney disease: Secondary | ICD-10-CM | POA: Diagnosis not present

## 2015-04-22 DIAGNOSIS — N186 End stage renal disease: Secondary | ICD-10-CM | POA: Diagnosis not present

## 2015-04-22 DIAGNOSIS — E1129 Type 2 diabetes mellitus with other diabetic kidney complication: Secondary | ICD-10-CM | POA: Diagnosis not present

## 2015-04-25 DIAGNOSIS — N2581 Secondary hyperparathyroidism of renal origin: Secondary | ICD-10-CM | POA: Diagnosis not present

## 2015-04-25 DIAGNOSIS — D631 Anemia in chronic kidney disease: Secondary | ICD-10-CM | POA: Diagnosis not present

## 2015-04-25 DIAGNOSIS — N186 End stage renal disease: Secondary | ICD-10-CM | POA: Diagnosis not present

## 2015-04-25 DIAGNOSIS — E1129 Type 2 diabetes mellitus with other diabetic kidney complication: Secondary | ICD-10-CM | POA: Diagnosis not present

## 2015-04-27 DIAGNOSIS — E1129 Type 2 diabetes mellitus with other diabetic kidney complication: Secondary | ICD-10-CM | POA: Diagnosis not present

## 2015-04-27 DIAGNOSIS — N186 End stage renal disease: Secondary | ICD-10-CM | POA: Diagnosis not present

## 2015-04-27 DIAGNOSIS — N2581 Secondary hyperparathyroidism of renal origin: Secondary | ICD-10-CM | POA: Diagnosis not present

## 2015-04-27 DIAGNOSIS — D631 Anemia in chronic kidney disease: Secondary | ICD-10-CM | POA: Diagnosis not present

## 2015-04-28 DIAGNOSIS — E039 Hypothyroidism, unspecified: Secondary | ICD-10-CM | POA: Diagnosis not present

## 2015-04-28 DIAGNOSIS — E782 Mixed hyperlipidemia: Secondary | ICD-10-CM | POA: Diagnosis not present

## 2015-04-28 DIAGNOSIS — K210 Gastro-esophageal reflux disease with esophagitis: Secondary | ICD-10-CM | POA: Diagnosis not present

## 2015-04-28 DIAGNOSIS — E1122 Type 2 diabetes mellitus with diabetic chronic kidney disease: Secondary | ICD-10-CM | POA: Diagnosis not present

## 2015-04-28 DIAGNOSIS — I10 Essential (primary) hypertension: Secondary | ICD-10-CM | POA: Diagnosis not present

## 2015-04-29 DIAGNOSIS — N2581 Secondary hyperparathyroidism of renal origin: Secondary | ICD-10-CM | POA: Diagnosis not present

## 2015-04-29 DIAGNOSIS — N186 End stage renal disease: Secondary | ICD-10-CM | POA: Diagnosis not present

## 2015-04-29 DIAGNOSIS — E1129 Type 2 diabetes mellitus with other diabetic kidney complication: Secondary | ICD-10-CM | POA: Diagnosis not present

## 2015-04-29 DIAGNOSIS — D631 Anemia in chronic kidney disease: Secondary | ICD-10-CM | POA: Diagnosis not present

## 2015-05-02 DIAGNOSIS — D631 Anemia in chronic kidney disease: Secondary | ICD-10-CM | POA: Diagnosis not present

## 2015-05-02 DIAGNOSIS — N186 End stage renal disease: Secondary | ICD-10-CM | POA: Diagnosis not present

## 2015-05-02 DIAGNOSIS — N2581 Secondary hyperparathyroidism of renal origin: Secondary | ICD-10-CM | POA: Diagnosis not present

## 2015-05-02 DIAGNOSIS — E1129 Type 2 diabetes mellitus with other diabetic kidney complication: Secondary | ICD-10-CM | POA: Diagnosis not present

## 2015-05-03 DIAGNOSIS — Z23 Encounter for immunization: Secondary | ICD-10-CM | POA: Diagnosis not present

## 2015-05-03 DIAGNOSIS — E1122 Type 2 diabetes mellitus with diabetic chronic kidney disease: Secondary | ICD-10-CM | POA: Diagnosis not present

## 2015-05-03 DIAGNOSIS — J449 Chronic obstructive pulmonary disease, unspecified: Secondary | ICD-10-CM | POA: Diagnosis not present

## 2015-05-03 DIAGNOSIS — K219 Gastro-esophageal reflux disease without esophagitis: Secondary | ICD-10-CM | POA: Diagnosis not present

## 2015-05-03 DIAGNOSIS — I739 Peripheral vascular disease, unspecified: Secondary | ICD-10-CM | POA: Diagnosis not present

## 2015-05-03 DIAGNOSIS — Z992 Dependence on renal dialysis: Secondary | ICD-10-CM | POA: Diagnosis not present

## 2015-05-03 DIAGNOSIS — N185 Chronic kidney disease, stage 5: Secondary | ICD-10-CM | POA: Diagnosis not present

## 2015-05-03 DIAGNOSIS — I10 Essential (primary) hypertension: Secondary | ICD-10-CM | POA: Diagnosis not present

## 2015-05-03 DIAGNOSIS — E1142 Type 2 diabetes mellitus with diabetic polyneuropathy: Secondary | ICD-10-CM | POA: Diagnosis not present

## 2015-05-03 DIAGNOSIS — E039 Hypothyroidism, unspecified: Secondary | ICD-10-CM | POA: Diagnosis not present

## 2015-05-03 DIAGNOSIS — Z6833 Body mass index (BMI) 33.0-33.9, adult: Secondary | ICD-10-CM | POA: Diagnosis not present

## 2015-05-04 DIAGNOSIS — N2581 Secondary hyperparathyroidism of renal origin: Secondary | ICD-10-CM | POA: Diagnosis not present

## 2015-05-04 DIAGNOSIS — D631 Anemia in chronic kidney disease: Secondary | ICD-10-CM | POA: Diagnosis not present

## 2015-05-04 DIAGNOSIS — E1129 Type 2 diabetes mellitus with other diabetic kidney complication: Secondary | ICD-10-CM | POA: Diagnosis not present

## 2015-05-04 DIAGNOSIS — N186 End stage renal disease: Secondary | ICD-10-CM | POA: Diagnosis not present

## 2015-05-06 DIAGNOSIS — N186 End stage renal disease: Secondary | ICD-10-CM | POA: Diagnosis not present

## 2015-05-06 DIAGNOSIS — E1129 Type 2 diabetes mellitus with other diabetic kidney complication: Secondary | ICD-10-CM | POA: Diagnosis not present

## 2015-05-06 DIAGNOSIS — N2581 Secondary hyperparathyroidism of renal origin: Secondary | ICD-10-CM | POA: Diagnosis not present

## 2015-05-06 DIAGNOSIS — D631 Anemia in chronic kidney disease: Secondary | ICD-10-CM | POA: Diagnosis not present

## 2015-05-09 DIAGNOSIS — D631 Anemia in chronic kidney disease: Secondary | ICD-10-CM | POA: Diagnosis not present

## 2015-05-09 DIAGNOSIS — E1129 Type 2 diabetes mellitus with other diabetic kidney complication: Secondary | ICD-10-CM | POA: Diagnosis not present

## 2015-05-09 DIAGNOSIS — N186 End stage renal disease: Secondary | ICD-10-CM | POA: Diagnosis not present

## 2015-05-09 DIAGNOSIS — N2581 Secondary hyperparathyroidism of renal origin: Secondary | ICD-10-CM | POA: Diagnosis not present

## 2015-05-11 DIAGNOSIS — N2581 Secondary hyperparathyroidism of renal origin: Secondary | ICD-10-CM | POA: Diagnosis not present

## 2015-05-11 DIAGNOSIS — D631 Anemia in chronic kidney disease: Secondary | ICD-10-CM | POA: Diagnosis not present

## 2015-05-11 DIAGNOSIS — N186 End stage renal disease: Secondary | ICD-10-CM | POA: Diagnosis not present

## 2015-05-11 DIAGNOSIS — E1129 Type 2 diabetes mellitus with other diabetic kidney complication: Secondary | ICD-10-CM | POA: Diagnosis not present

## 2015-05-13 DIAGNOSIS — E1129 Type 2 diabetes mellitus with other diabetic kidney complication: Secondary | ICD-10-CM | POA: Diagnosis not present

## 2015-05-13 DIAGNOSIS — N186 End stage renal disease: Secondary | ICD-10-CM | POA: Diagnosis not present

## 2015-05-13 DIAGNOSIS — N2581 Secondary hyperparathyroidism of renal origin: Secondary | ICD-10-CM | POA: Diagnosis not present

## 2015-05-13 DIAGNOSIS — D631 Anemia in chronic kidney disease: Secondary | ICD-10-CM | POA: Diagnosis not present

## 2015-05-13 DIAGNOSIS — Z992 Dependence on renal dialysis: Secondary | ICD-10-CM | POA: Diagnosis not present

## 2015-05-16 DIAGNOSIS — D509 Iron deficiency anemia, unspecified: Secondary | ICD-10-CM | POA: Diagnosis not present

## 2015-05-16 DIAGNOSIS — N2581 Secondary hyperparathyroidism of renal origin: Secondary | ICD-10-CM | POA: Diagnosis not present

## 2015-05-16 DIAGNOSIS — E1129 Type 2 diabetes mellitus with other diabetic kidney complication: Secondary | ICD-10-CM | POA: Diagnosis not present

## 2015-05-16 DIAGNOSIS — N186 End stage renal disease: Secondary | ICD-10-CM | POA: Diagnosis not present

## 2015-05-16 DIAGNOSIS — D631 Anemia in chronic kidney disease: Secondary | ICD-10-CM | POA: Diagnosis not present

## 2015-05-18 DIAGNOSIS — N186 End stage renal disease: Secondary | ICD-10-CM | POA: Diagnosis not present

## 2015-05-18 DIAGNOSIS — E1129 Type 2 diabetes mellitus with other diabetic kidney complication: Secondary | ICD-10-CM | POA: Diagnosis not present

## 2015-05-18 DIAGNOSIS — D631 Anemia in chronic kidney disease: Secondary | ICD-10-CM | POA: Diagnosis not present

## 2015-05-18 DIAGNOSIS — N2581 Secondary hyperparathyroidism of renal origin: Secondary | ICD-10-CM | POA: Diagnosis not present

## 2015-05-18 DIAGNOSIS — D509 Iron deficiency anemia, unspecified: Secondary | ICD-10-CM | POA: Diagnosis not present

## 2015-05-20 DIAGNOSIS — N2581 Secondary hyperparathyroidism of renal origin: Secondary | ICD-10-CM | POA: Diagnosis not present

## 2015-05-20 DIAGNOSIS — N186 End stage renal disease: Secondary | ICD-10-CM | POA: Diagnosis not present

## 2015-05-20 DIAGNOSIS — D509 Iron deficiency anemia, unspecified: Secondary | ICD-10-CM | POA: Diagnosis not present

## 2015-05-20 DIAGNOSIS — E1129 Type 2 diabetes mellitus with other diabetic kidney complication: Secondary | ICD-10-CM | POA: Diagnosis not present

## 2015-05-20 DIAGNOSIS — D631 Anemia in chronic kidney disease: Secondary | ICD-10-CM | POA: Diagnosis not present

## 2015-05-23 DIAGNOSIS — D509 Iron deficiency anemia, unspecified: Secondary | ICD-10-CM | POA: Diagnosis not present

## 2015-05-23 DIAGNOSIS — N186 End stage renal disease: Secondary | ICD-10-CM | POA: Diagnosis not present

## 2015-05-23 DIAGNOSIS — D631 Anemia in chronic kidney disease: Secondary | ICD-10-CM | POA: Diagnosis not present

## 2015-05-23 DIAGNOSIS — E1129 Type 2 diabetes mellitus with other diabetic kidney complication: Secondary | ICD-10-CM | POA: Diagnosis not present

## 2015-05-23 DIAGNOSIS — N2581 Secondary hyperparathyroidism of renal origin: Secondary | ICD-10-CM | POA: Diagnosis not present

## 2015-05-24 DIAGNOSIS — L609 Nail disorder, unspecified: Secondary | ICD-10-CM | POA: Diagnosis not present

## 2015-05-24 DIAGNOSIS — G609 Hereditary and idiopathic neuropathy, unspecified: Secondary | ICD-10-CM | POA: Diagnosis not present

## 2015-05-24 DIAGNOSIS — E1122 Type 2 diabetes mellitus with diabetic chronic kidney disease: Secondary | ICD-10-CM | POA: Diagnosis not present

## 2015-05-25 DIAGNOSIS — D631 Anemia in chronic kidney disease: Secondary | ICD-10-CM | POA: Diagnosis not present

## 2015-05-25 DIAGNOSIS — N2581 Secondary hyperparathyroidism of renal origin: Secondary | ICD-10-CM | POA: Diagnosis not present

## 2015-05-25 DIAGNOSIS — E1129 Type 2 diabetes mellitus with other diabetic kidney complication: Secondary | ICD-10-CM | POA: Diagnosis not present

## 2015-05-25 DIAGNOSIS — D509 Iron deficiency anemia, unspecified: Secondary | ICD-10-CM | POA: Diagnosis not present

## 2015-05-25 DIAGNOSIS — N186 End stage renal disease: Secondary | ICD-10-CM | POA: Diagnosis not present

## 2015-05-27 DIAGNOSIS — E1129 Type 2 diabetes mellitus with other diabetic kidney complication: Secondary | ICD-10-CM | POA: Diagnosis not present

## 2015-05-27 DIAGNOSIS — N186 End stage renal disease: Secondary | ICD-10-CM | POA: Diagnosis not present

## 2015-05-27 DIAGNOSIS — N2581 Secondary hyperparathyroidism of renal origin: Secondary | ICD-10-CM | POA: Diagnosis not present

## 2015-05-27 DIAGNOSIS — D509 Iron deficiency anemia, unspecified: Secondary | ICD-10-CM | POA: Diagnosis not present

## 2015-05-27 DIAGNOSIS — D631 Anemia in chronic kidney disease: Secondary | ICD-10-CM | POA: Diagnosis not present

## 2015-05-30 DIAGNOSIS — N2581 Secondary hyperparathyroidism of renal origin: Secondary | ICD-10-CM | POA: Diagnosis not present

## 2015-05-30 DIAGNOSIS — D509 Iron deficiency anemia, unspecified: Secondary | ICD-10-CM | POA: Diagnosis not present

## 2015-05-30 DIAGNOSIS — E1129 Type 2 diabetes mellitus with other diabetic kidney complication: Secondary | ICD-10-CM | POA: Diagnosis not present

## 2015-05-30 DIAGNOSIS — D631 Anemia in chronic kidney disease: Secondary | ICD-10-CM | POA: Diagnosis not present

## 2015-05-30 DIAGNOSIS — N186 End stage renal disease: Secondary | ICD-10-CM | POA: Diagnosis not present

## 2015-06-01 DIAGNOSIS — D631 Anemia in chronic kidney disease: Secondary | ICD-10-CM | POA: Diagnosis not present

## 2015-06-01 DIAGNOSIS — N2581 Secondary hyperparathyroidism of renal origin: Secondary | ICD-10-CM | POA: Diagnosis not present

## 2015-06-01 DIAGNOSIS — E1129 Type 2 diabetes mellitus with other diabetic kidney complication: Secondary | ICD-10-CM | POA: Diagnosis not present

## 2015-06-01 DIAGNOSIS — N186 End stage renal disease: Secondary | ICD-10-CM | POA: Diagnosis not present

## 2015-06-01 DIAGNOSIS — D509 Iron deficiency anemia, unspecified: Secondary | ICD-10-CM | POA: Diagnosis not present

## 2015-06-03 DIAGNOSIS — N2581 Secondary hyperparathyroidism of renal origin: Secondary | ICD-10-CM | POA: Diagnosis not present

## 2015-06-03 DIAGNOSIS — D631 Anemia in chronic kidney disease: Secondary | ICD-10-CM | POA: Diagnosis not present

## 2015-06-03 DIAGNOSIS — N186 End stage renal disease: Secondary | ICD-10-CM | POA: Diagnosis not present

## 2015-06-03 DIAGNOSIS — D509 Iron deficiency anemia, unspecified: Secondary | ICD-10-CM | POA: Diagnosis not present

## 2015-06-03 DIAGNOSIS — E1129 Type 2 diabetes mellitus with other diabetic kidney complication: Secondary | ICD-10-CM | POA: Diagnosis not present

## 2015-06-06 DIAGNOSIS — D631 Anemia in chronic kidney disease: Secondary | ICD-10-CM | POA: Diagnosis not present

## 2015-06-06 DIAGNOSIS — N2581 Secondary hyperparathyroidism of renal origin: Secondary | ICD-10-CM | POA: Diagnosis not present

## 2015-06-06 DIAGNOSIS — N186 End stage renal disease: Secondary | ICD-10-CM | POA: Diagnosis not present

## 2015-06-06 DIAGNOSIS — E1129 Type 2 diabetes mellitus with other diabetic kidney complication: Secondary | ICD-10-CM | POA: Diagnosis not present

## 2015-06-06 DIAGNOSIS — D509 Iron deficiency anemia, unspecified: Secondary | ICD-10-CM | POA: Diagnosis not present

## 2015-06-08 DIAGNOSIS — N2581 Secondary hyperparathyroidism of renal origin: Secondary | ICD-10-CM | POA: Diagnosis not present

## 2015-06-08 DIAGNOSIS — E1129 Type 2 diabetes mellitus with other diabetic kidney complication: Secondary | ICD-10-CM | POA: Diagnosis not present

## 2015-06-08 DIAGNOSIS — N186 End stage renal disease: Secondary | ICD-10-CM | POA: Diagnosis not present

## 2015-06-08 DIAGNOSIS — D509 Iron deficiency anemia, unspecified: Secondary | ICD-10-CM | POA: Diagnosis not present

## 2015-06-08 DIAGNOSIS — D631 Anemia in chronic kidney disease: Secondary | ICD-10-CM | POA: Diagnosis not present

## 2015-06-10 DIAGNOSIS — D509 Iron deficiency anemia, unspecified: Secondary | ICD-10-CM | POA: Diagnosis not present

## 2015-06-10 DIAGNOSIS — D631 Anemia in chronic kidney disease: Secondary | ICD-10-CM | POA: Diagnosis not present

## 2015-06-10 DIAGNOSIS — N2581 Secondary hyperparathyroidism of renal origin: Secondary | ICD-10-CM | POA: Diagnosis not present

## 2015-06-10 DIAGNOSIS — N186 End stage renal disease: Secondary | ICD-10-CM | POA: Diagnosis not present

## 2015-06-10 DIAGNOSIS — E1129 Type 2 diabetes mellitus with other diabetic kidney complication: Secondary | ICD-10-CM | POA: Diagnosis not present

## 2015-06-13 DIAGNOSIS — D631 Anemia in chronic kidney disease: Secondary | ICD-10-CM | POA: Diagnosis not present

## 2015-06-13 DIAGNOSIS — N186 End stage renal disease: Secondary | ICD-10-CM | POA: Diagnosis not present

## 2015-06-13 DIAGNOSIS — N2581 Secondary hyperparathyroidism of renal origin: Secondary | ICD-10-CM | POA: Diagnosis not present

## 2015-06-13 DIAGNOSIS — D509 Iron deficiency anemia, unspecified: Secondary | ICD-10-CM | POA: Diagnosis not present

## 2015-06-13 DIAGNOSIS — Z992 Dependence on renal dialysis: Secondary | ICD-10-CM | POA: Diagnosis not present

## 2015-06-13 DIAGNOSIS — E1129 Type 2 diabetes mellitus with other diabetic kidney complication: Secondary | ICD-10-CM | POA: Diagnosis not present

## 2015-06-15 DIAGNOSIS — D509 Iron deficiency anemia, unspecified: Secondary | ICD-10-CM | POA: Diagnosis not present

## 2015-06-15 DIAGNOSIS — D631 Anemia in chronic kidney disease: Secondary | ICD-10-CM | POA: Diagnosis not present

## 2015-06-15 DIAGNOSIS — N2581 Secondary hyperparathyroidism of renal origin: Secondary | ICD-10-CM | POA: Diagnosis not present

## 2015-06-15 DIAGNOSIS — N186 End stage renal disease: Secondary | ICD-10-CM | POA: Diagnosis not present

## 2015-06-15 DIAGNOSIS — E1129 Type 2 diabetes mellitus with other diabetic kidney complication: Secondary | ICD-10-CM | POA: Diagnosis not present

## 2015-06-15 DIAGNOSIS — E211 Secondary hyperparathyroidism, not elsewhere classified: Secondary | ICD-10-CM | POA: Diagnosis not present

## 2015-06-17 DIAGNOSIS — N186 End stage renal disease: Secondary | ICD-10-CM | POA: Diagnosis not present

## 2015-06-17 DIAGNOSIS — D631 Anemia in chronic kidney disease: Secondary | ICD-10-CM | POA: Diagnosis not present

## 2015-06-17 DIAGNOSIS — E211 Secondary hyperparathyroidism, not elsewhere classified: Secondary | ICD-10-CM | POA: Diagnosis not present

## 2015-06-17 DIAGNOSIS — E1129 Type 2 diabetes mellitus with other diabetic kidney complication: Secondary | ICD-10-CM | POA: Diagnosis not present

## 2015-06-17 DIAGNOSIS — N2581 Secondary hyperparathyroidism of renal origin: Secondary | ICD-10-CM | POA: Diagnosis not present

## 2015-06-17 DIAGNOSIS — D509 Iron deficiency anemia, unspecified: Secondary | ICD-10-CM | POA: Diagnosis not present

## 2015-06-20 DIAGNOSIS — R42 Dizziness and giddiness: Secondary | ICD-10-CM | POA: Insufficient documentation

## 2015-06-20 DIAGNOSIS — N2581 Secondary hyperparathyroidism of renal origin: Secondary | ICD-10-CM | POA: Diagnosis not present

## 2015-06-20 DIAGNOSIS — D509 Iron deficiency anemia, unspecified: Secondary | ICD-10-CM | POA: Diagnosis not present

## 2015-06-20 DIAGNOSIS — D631 Anemia in chronic kidney disease: Secondary | ICD-10-CM | POA: Diagnosis not present

## 2015-06-20 DIAGNOSIS — E1129 Type 2 diabetes mellitus with other diabetic kidney complication: Secondary | ICD-10-CM | POA: Diagnosis not present

## 2015-06-20 DIAGNOSIS — N186 End stage renal disease: Secondary | ICD-10-CM | POA: Diagnosis not present

## 2015-06-20 DIAGNOSIS — E211 Secondary hyperparathyroidism, not elsewhere classified: Secondary | ICD-10-CM | POA: Diagnosis not present

## 2015-06-21 DIAGNOSIS — R42 Dizziness and giddiness: Secondary | ICD-10-CM | POA: Diagnosis not present

## 2015-06-21 DIAGNOSIS — I10 Essential (primary) hypertension: Secondary | ICD-10-CM | POA: Diagnosis not present

## 2015-06-22 ENCOUNTER — Other Ambulatory Visit (HOSPITAL_COMMUNITY): Payer: Self-pay | Admitting: Family Medicine

## 2015-06-22 DIAGNOSIS — R42 Dizziness and giddiness: Secondary | ICD-10-CM

## 2015-06-22 DIAGNOSIS — D631 Anemia in chronic kidney disease: Secondary | ICD-10-CM | POA: Diagnosis not present

## 2015-06-22 DIAGNOSIS — N186 End stage renal disease: Secondary | ICD-10-CM | POA: Diagnosis not present

## 2015-06-22 DIAGNOSIS — E1129 Type 2 diabetes mellitus with other diabetic kidney complication: Secondary | ICD-10-CM | POA: Diagnosis not present

## 2015-06-22 DIAGNOSIS — D509 Iron deficiency anemia, unspecified: Secondary | ICD-10-CM | POA: Diagnosis not present

## 2015-06-22 DIAGNOSIS — E211 Secondary hyperparathyroidism, not elsewhere classified: Secondary | ICD-10-CM | POA: Diagnosis not present

## 2015-06-22 DIAGNOSIS — N2581 Secondary hyperparathyroidism of renal origin: Secondary | ICD-10-CM | POA: Diagnosis not present

## 2015-06-24 DIAGNOSIS — E211 Secondary hyperparathyroidism, not elsewhere classified: Secondary | ICD-10-CM | POA: Diagnosis not present

## 2015-06-24 DIAGNOSIS — D509 Iron deficiency anemia, unspecified: Secondary | ICD-10-CM | POA: Diagnosis not present

## 2015-06-24 DIAGNOSIS — D631 Anemia in chronic kidney disease: Secondary | ICD-10-CM | POA: Diagnosis not present

## 2015-06-24 DIAGNOSIS — N2581 Secondary hyperparathyroidism of renal origin: Secondary | ICD-10-CM | POA: Diagnosis not present

## 2015-06-24 DIAGNOSIS — N186 End stage renal disease: Secondary | ICD-10-CM | POA: Diagnosis not present

## 2015-06-24 DIAGNOSIS — E1129 Type 2 diabetes mellitus with other diabetic kidney complication: Secondary | ICD-10-CM | POA: Diagnosis not present

## 2015-06-27 DIAGNOSIS — E211 Secondary hyperparathyroidism, not elsewhere classified: Secondary | ICD-10-CM | POA: Diagnosis not present

## 2015-06-27 DIAGNOSIS — D631 Anemia in chronic kidney disease: Secondary | ICD-10-CM | POA: Diagnosis not present

## 2015-06-27 DIAGNOSIS — E1129 Type 2 diabetes mellitus with other diabetic kidney complication: Secondary | ICD-10-CM | POA: Diagnosis not present

## 2015-06-27 DIAGNOSIS — N2581 Secondary hyperparathyroidism of renal origin: Secondary | ICD-10-CM | POA: Diagnosis not present

## 2015-06-27 DIAGNOSIS — N186 End stage renal disease: Secondary | ICD-10-CM | POA: Diagnosis not present

## 2015-06-27 DIAGNOSIS — D509 Iron deficiency anemia, unspecified: Secondary | ICD-10-CM | POA: Diagnosis not present

## 2015-06-29 DIAGNOSIS — D509 Iron deficiency anemia, unspecified: Secondary | ICD-10-CM | POA: Diagnosis not present

## 2015-06-29 DIAGNOSIS — N2581 Secondary hyperparathyroidism of renal origin: Secondary | ICD-10-CM | POA: Diagnosis not present

## 2015-06-29 DIAGNOSIS — E211 Secondary hyperparathyroidism, not elsewhere classified: Secondary | ICD-10-CM | POA: Diagnosis not present

## 2015-06-29 DIAGNOSIS — D631 Anemia in chronic kidney disease: Secondary | ICD-10-CM | POA: Diagnosis not present

## 2015-06-29 DIAGNOSIS — N186 End stage renal disease: Secondary | ICD-10-CM | POA: Diagnosis not present

## 2015-06-29 DIAGNOSIS — E1129 Type 2 diabetes mellitus with other diabetic kidney complication: Secondary | ICD-10-CM | POA: Diagnosis not present

## 2015-07-01 DIAGNOSIS — D631 Anemia in chronic kidney disease: Secondary | ICD-10-CM | POA: Diagnosis not present

## 2015-07-01 DIAGNOSIS — E1129 Type 2 diabetes mellitus with other diabetic kidney complication: Secondary | ICD-10-CM | POA: Diagnosis not present

## 2015-07-01 DIAGNOSIS — N2581 Secondary hyperparathyroidism of renal origin: Secondary | ICD-10-CM | POA: Diagnosis not present

## 2015-07-01 DIAGNOSIS — D509 Iron deficiency anemia, unspecified: Secondary | ICD-10-CM | POA: Diagnosis not present

## 2015-07-01 DIAGNOSIS — E211 Secondary hyperparathyroidism, not elsewhere classified: Secondary | ICD-10-CM | POA: Diagnosis not present

## 2015-07-01 DIAGNOSIS — N186 End stage renal disease: Secondary | ICD-10-CM | POA: Diagnosis not present

## 2015-07-03 DIAGNOSIS — E1129 Type 2 diabetes mellitus with other diabetic kidney complication: Secondary | ICD-10-CM | POA: Diagnosis not present

## 2015-07-03 DIAGNOSIS — N2581 Secondary hyperparathyroidism of renal origin: Secondary | ICD-10-CM | POA: Diagnosis not present

## 2015-07-03 DIAGNOSIS — D631 Anemia in chronic kidney disease: Secondary | ICD-10-CM | POA: Diagnosis not present

## 2015-07-03 DIAGNOSIS — E211 Secondary hyperparathyroidism, not elsewhere classified: Secondary | ICD-10-CM | POA: Diagnosis not present

## 2015-07-03 DIAGNOSIS — D509 Iron deficiency anemia, unspecified: Secondary | ICD-10-CM | POA: Diagnosis not present

## 2015-07-03 DIAGNOSIS — N186 End stage renal disease: Secondary | ICD-10-CM | POA: Diagnosis not present

## 2015-07-05 DIAGNOSIS — D509 Iron deficiency anemia, unspecified: Secondary | ICD-10-CM | POA: Diagnosis not present

## 2015-07-05 DIAGNOSIS — D631 Anemia in chronic kidney disease: Secondary | ICD-10-CM | POA: Diagnosis not present

## 2015-07-05 DIAGNOSIS — E211 Secondary hyperparathyroidism, not elsewhere classified: Secondary | ICD-10-CM | POA: Diagnosis not present

## 2015-07-05 DIAGNOSIS — N2581 Secondary hyperparathyroidism of renal origin: Secondary | ICD-10-CM | POA: Diagnosis not present

## 2015-07-05 DIAGNOSIS — N186 End stage renal disease: Secondary | ICD-10-CM | POA: Diagnosis not present

## 2015-07-05 DIAGNOSIS — E1129 Type 2 diabetes mellitus with other diabetic kidney complication: Secondary | ICD-10-CM | POA: Diagnosis not present

## 2015-07-08 DIAGNOSIS — E1129 Type 2 diabetes mellitus with other diabetic kidney complication: Secondary | ICD-10-CM | POA: Diagnosis not present

## 2015-07-08 DIAGNOSIS — D509 Iron deficiency anemia, unspecified: Secondary | ICD-10-CM | POA: Diagnosis not present

## 2015-07-08 DIAGNOSIS — N186 End stage renal disease: Secondary | ICD-10-CM | POA: Diagnosis not present

## 2015-07-08 DIAGNOSIS — N2581 Secondary hyperparathyroidism of renal origin: Secondary | ICD-10-CM | POA: Diagnosis not present

## 2015-07-08 DIAGNOSIS — E211 Secondary hyperparathyroidism, not elsewhere classified: Secondary | ICD-10-CM | POA: Diagnosis not present

## 2015-07-08 DIAGNOSIS — D631 Anemia in chronic kidney disease: Secondary | ICD-10-CM | POA: Diagnosis not present

## 2015-07-11 ENCOUNTER — Ambulatory Visit (HOSPITAL_COMMUNITY): Payer: 59

## 2015-07-11 DIAGNOSIS — E211 Secondary hyperparathyroidism, not elsewhere classified: Secondary | ICD-10-CM | POA: Diagnosis not present

## 2015-07-11 DIAGNOSIS — D509 Iron deficiency anemia, unspecified: Secondary | ICD-10-CM | POA: Diagnosis not present

## 2015-07-11 DIAGNOSIS — N186 End stage renal disease: Secondary | ICD-10-CM | POA: Diagnosis not present

## 2015-07-11 DIAGNOSIS — E1129 Type 2 diabetes mellitus with other diabetic kidney complication: Secondary | ICD-10-CM | POA: Diagnosis not present

## 2015-07-11 DIAGNOSIS — D631 Anemia in chronic kidney disease: Secondary | ICD-10-CM | POA: Diagnosis not present

## 2015-07-11 DIAGNOSIS — N2581 Secondary hyperparathyroidism of renal origin: Secondary | ICD-10-CM | POA: Diagnosis not present

## 2015-07-13 DIAGNOSIS — N186 End stage renal disease: Secondary | ICD-10-CM | POA: Diagnosis not present

## 2015-07-13 DIAGNOSIS — E1129 Type 2 diabetes mellitus with other diabetic kidney complication: Secondary | ICD-10-CM | POA: Diagnosis not present

## 2015-07-13 DIAGNOSIS — D509 Iron deficiency anemia, unspecified: Secondary | ICD-10-CM | POA: Diagnosis not present

## 2015-07-13 DIAGNOSIS — Z992 Dependence on renal dialysis: Secondary | ICD-10-CM | POA: Diagnosis not present

## 2015-07-13 DIAGNOSIS — N2581 Secondary hyperparathyroidism of renal origin: Secondary | ICD-10-CM | POA: Diagnosis not present

## 2015-07-13 DIAGNOSIS — D631 Anemia in chronic kidney disease: Secondary | ICD-10-CM | POA: Diagnosis not present

## 2015-07-13 DIAGNOSIS — E211 Secondary hyperparathyroidism, not elsewhere classified: Secondary | ICD-10-CM | POA: Diagnosis not present

## 2015-07-15 DIAGNOSIS — N2581 Secondary hyperparathyroidism of renal origin: Secondary | ICD-10-CM | POA: Diagnosis not present

## 2015-07-15 DIAGNOSIS — D631 Anemia in chronic kidney disease: Secondary | ICD-10-CM | POA: Diagnosis not present

## 2015-07-15 DIAGNOSIS — D509 Iron deficiency anemia, unspecified: Secondary | ICD-10-CM | POA: Diagnosis not present

## 2015-07-15 DIAGNOSIS — E1129 Type 2 diabetes mellitus with other diabetic kidney complication: Secondary | ICD-10-CM | POA: Diagnosis not present

## 2015-07-15 DIAGNOSIS — N186 End stage renal disease: Secondary | ICD-10-CM | POA: Diagnosis not present

## 2015-07-18 DIAGNOSIS — D631 Anemia in chronic kidney disease: Secondary | ICD-10-CM | POA: Diagnosis not present

## 2015-07-18 DIAGNOSIS — N2581 Secondary hyperparathyroidism of renal origin: Secondary | ICD-10-CM | POA: Diagnosis not present

## 2015-07-18 DIAGNOSIS — E1129 Type 2 diabetes mellitus with other diabetic kidney complication: Secondary | ICD-10-CM | POA: Diagnosis not present

## 2015-07-18 DIAGNOSIS — D509 Iron deficiency anemia, unspecified: Secondary | ICD-10-CM | POA: Diagnosis not present

## 2015-07-18 DIAGNOSIS — N186 End stage renal disease: Secondary | ICD-10-CM | POA: Diagnosis not present

## 2015-07-19 ENCOUNTER — Ambulatory Visit (HOSPITAL_COMMUNITY): Payer: 59

## 2015-07-20 DIAGNOSIS — N2581 Secondary hyperparathyroidism of renal origin: Secondary | ICD-10-CM | POA: Diagnosis not present

## 2015-07-20 DIAGNOSIS — E1129 Type 2 diabetes mellitus with other diabetic kidney complication: Secondary | ICD-10-CM | POA: Diagnosis not present

## 2015-07-20 DIAGNOSIS — D631 Anemia in chronic kidney disease: Secondary | ICD-10-CM | POA: Diagnosis not present

## 2015-07-20 DIAGNOSIS — D509 Iron deficiency anemia, unspecified: Secondary | ICD-10-CM | POA: Diagnosis not present

## 2015-07-20 DIAGNOSIS — N186 End stage renal disease: Secondary | ICD-10-CM | POA: Diagnosis not present

## 2015-07-21 ENCOUNTER — Ambulatory Visit (HOSPITAL_COMMUNITY)
Admission: RE | Admit: 2015-07-21 | Discharge: 2015-07-21 | Disposition: A | Discharge status: Home / Self Care | Payer: Medicare Other | Source: Ambulatory Visit | Attending: Family Medicine | Admitting: Family Medicine

## 2015-07-21 DIAGNOSIS — G3189 Other specified degenerative diseases of nervous system: Secondary | ICD-10-CM | POA: Insufficient documentation

## 2015-07-21 DIAGNOSIS — R55 Syncope and collapse: Secondary | ICD-10-CM | POA: Diagnosis not present

## 2015-07-21 DIAGNOSIS — R296 Repeated falls: Secondary | ICD-10-CM | POA: Diagnosis not present

## 2015-07-21 DIAGNOSIS — R269 Unspecified abnormalities of gait and mobility: Secondary | ICD-10-CM | POA: Diagnosis not present

## 2015-07-21 DIAGNOSIS — R42 Dizziness and giddiness: Secondary | ICD-10-CM | POA: Diagnosis not present

## 2015-07-22 DIAGNOSIS — N2581 Secondary hyperparathyroidism of renal origin: Secondary | ICD-10-CM | POA: Diagnosis not present

## 2015-07-22 DIAGNOSIS — E1129 Type 2 diabetes mellitus with other diabetic kidney complication: Secondary | ICD-10-CM | POA: Diagnosis not present

## 2015-07-22 DIAGNOSIS — D631 Anemia in chronic kidney disease: Secondary | ICD-10-CM | POA: Diagnosis not present

## 2015-07-22 DIAGNOSIS — D509 Iron deficiency anemia, unspecified: Secondary | ICD-10-CM | POA: Diagnosis not present

## 2015-07-22 DIAGNOSIS — N186 End stage renal disease: Secondary | ICD-10-CM | POA: Diagnosis not present

## 2015-07-25 DIAGNOSIS — N2581 Secondary hyperparathyroidism of renal origin: Secondary | ICD-10-CM | POA: Diagnosis not present

## 2015-07-25 DIAGNOSIS — E1129 Type 2 diabetes mellitus with other diabetic kidney complication: Secondary | ICD-10-CM | POA: Diagnosis not present

## 2015-07-25 DIAGNOSIS — D631 Anemia in chronic kidney disease: Secondary | ICD-10-CM | POA: Diagnosis not present

## 2015-07-25 DIAGNOSIS — D509 Iron deficiency anemia, unspecified: Secondary | ICD-10-CM | POA: Diagnosis not present

## 2015-07-25 DIAGNOSIS — N186 End stage renal disease: Secondary | ICD-10-CM | POA: Diagnosis not present

## 2015-07-26 DIAGNOSIS — E1122 Type 2 diabetes mellitus with diabetic chronic kidney disease: Secondary | ICD-10-CM | POA: Diagnosis not present

## 2015-07-26 DIAGNOSIS — I10 Essential (primary) hypertension: Secondary | ICD-10-CM | POA: Diagnosis not present

## 2015-07-26 DIAGNOSIS — E039 Hypothyroidism, unspecified: Secondary | ICD-10-CM | POA: Diagnosis not present

## 2015-07-26 DIAGNOSIS — N185 Chronic kidney disease, stage 5: Secondary | ICD-10-CM | POA: Diagnosis not present

## 2015-07-26 DIAGNOSIS — E782 Mixed hyperlipidemia: Secondary | ICD-10-CM | POA: Diagnosis not present

## 2015-07-27 DIAGNOSIS — N2581 Secondary hyperparathyroidism of renal origin: Secondary | ICD-10-CM | POA: Diagnosis not present

## 2015-07-27 DIAGNOSIS — N186 End stage renal disease: Secondary | ICD-10-CM | POA: Diagnosis not present

## 2015-07-27 DIAGNOSIS — D509 Iron deficiency anemia, unspecified: Secondary | ICD-10-CM | POA: Diagnosis not present

## 2015-07-27 DIAGNOSIS — E1129 Type 2 diabetes mellitus with other diabetic kidney complication: Secondary | ICD-10-CM | POA: Diagnosis not present

## 2015-07-27 DIAGNOSIS — D631 Anemia in chronic kidney disease: Secondary | ICD-10-CM | POA: Diagnosis not present

## 2015-07-29 DIAGNOSIS — D509 Iron deficiency anemia, unspecified: Secondary | ICD-10-CM | POA: Diagnosis not present

## 2015-07-29 DIAGNOSIS — D631 Anemia in chronic kidney disease: Secondary | ICD-10-CM | POA: Diagnosis not present

## 2015-07-29 DIAGNOSIS — N2581 Secondary hyperparathyroidism of renal origin: Secondary | ICD-10-CM | POA: Diagnosis not present

## 2015-07-29 DIAGNOSIS — N186 End stage renal disease: Secondary | ICD-10-CM | POA: Diagnosis not present

## 2015-07-29 DIAGNOSIS — E1129 Type 2 diabetes mellitus with other diabetic kidney complication: Secondary | ICD-10-CM | POA: Diagnosis not present

## 2015-08-01 DIAGNOSIS — R300 Dysuria: Secondary | ICD-10-CM | POA: Insufficient documentation

## 2015-08-01 DIAGNOSIS — D631 Anemia in chronic kidney disease: Secondary | ICD-10-CM | POA: Diagnosis not present

## 2015-08-01 DIAGNOSIS — E1129 Type 2 diabetes mellitus with other diabetic kidney complication: Secondary | ICD-10-CM | POA: Diagnosis not present

## 2015-08-01 DIAGNOSIS — N186 End stage renal disease: Secondary | ICD-10-CM | POA: Diagnosis not present

## 2015-08-01 DIAGNOSIS — N2581 Secondary hyperparathyroidism of renal origin: Secondary | ICD-10-CM | POA: Diagnosis not present

## 2015-08-01 DIAGNOSIS — D509 Iron deficiency anemia, unspecified: Secondary | ICD-10-CM | POA: Diagnosis not present

## 2015-08-02 DIAGNOSIS — I10 Essential (primary) hypertension: Secondary | ICD-10-CM | POA: Diagnosis not present

## 2015-08-02 DIAGNOSIS — G6289 Other specified polyneuropathies: Secondary | ICD-10-CM | POA: Diagnosis not present

## 2015-08-02 DIAGNOSIS — L609 Nail disorder, unspecified: Secondary | ICD-10-CM | POA: Diagnosis not present

## 2015-08-02 DIAGNOSIS — G609 Hereditary and idiopathic neuropathy, unspecified: Secondary | ICD-10-CM | POA: Diagnosis not present

## 2015-08-02 DIAGNOSIS — E782 Mixed hyperlipidemia: Secondary | ICD-10-CM | POA: Diagnosis not present

## 2015-08-02 DIAGNOSIS — J449 Chronic obstructive pulmonary disease, unspecified: Secondary | ICD-10-CM | POA: Diagnosis not present

## 2015-08-02 DIAGNOSIS — Z992 Dependence on renal dialysis: Secondary | ICD-10-CM | POA: Diagnosis not present

## 2015-08-02 DIAGNOSIS — I739 Peripheral vascular disease, unspecified: Secondary | ICD-10-CM | POA: Diagnosis not present

## 2015-08-02 DIAGNOSIS — E1122 Type 2 diabetes mellitus with diabetic chronic kidney disease: Secondary | ICD-10-CM | POA: Diagnosis not present

## 2015-08-02 DIAGNOSIS — N185 Chronic kidney disease, stage 5: Secondary | ICD-10-CM | POA: Diagnosis not present

## 2015-08-02 DIAGNOSIS — R300 Dysuria: Secondary | ICD-10-CM | POA: Diagnosis not present

## 2015-08-02 DIAGNOSIS — K219 Gastro-esophageal reflux disease without esophagitis: Secondary | ICD-10-CM | POA: Diagnosis not present

## 2015-08-02 DIAGNOSIS — E1142 Type 2 diabetes mellitus with diabetic polyneuropathy: Secondary | ICD-10-CM | POA: Diagnosis not present

## 2015-08-03 DIAGNOSIS — N186 End stage renal disease: Secondary | ICD-10-CM | POA: Diagnosis not present

## 2015-08-03 DIAGNOSIS — D509 Iron deficiency anemia, unspecified: Secondary | ICD-10-CM | POA: Diagnosis not present

## 2015-08-03 DIAGNOSIS — E1129 Type 2 diabetes mellitus with other diabetic kidney complication: Secondary | ICD-10-CM | POA: Diagnosis not present

## 2015-08-03 DIAGNOSIS — N185 Chronic kidney disease, stage 5: Secondary | ICD-10-CM | POA: Diagnosis not present

## 2015-08-03 DIAGNOSIS — Z992 Dependence on renal dialysis: Secondary | ICD-10-CM | POA: Diagnosis not present

## 2015-08-03 DIAGNOSIS — N3000 Acute cystitis without hematuria: Secondary | ICD-10-CM | POA: Diagnosis not present

## 2015-08-03 DIAGNOSIS — N2581 Secondary hyperparathyroidism of renal origin: Secondary | ICD-10-CM | POA: Diagnosis not present

## 2015-08-03 DIAGNOSIS — D631 Anemia in chronic kidney disease: Secondary | ICD-10-CM | POA: Diagnosis not present

## 2015-08-05 DIAGNOSIS — D509 Iron deficiency anemia, unspecified: Secondary | ICD-10-CM | POA: Diagnosis not present

## 2015-08-05 DIAGNOSIS — E1129 Type 2 diabetes mellitus with other diabetic kidney complication: Secondary | ICD-10-CM | POA: Diagnosis not present

## 2015-08-05 DIAGNOSIS — N186 End stage renal disease: Secondary | ICD-10-CM | POA: Diagnosis not present

## 2015-08-05 DIAGNOSIS — D631 Anemia in chronic kidney disease: Secondary | ICD-10-CM | POA: Diagnosis not present

## 2015-08-05 DIAGNOSIS — N2581 Secondary hyperparathyroidism of renal origin: Secondary | ICD-10-CM | POA: Diagnosis not present

## 2015-08-08 DIAGNOSIS — N186 End stage renal disease: Secondary | ICD-10-CM | POA: Diagnosis not present

## 2015-08-08 DIAGNOSIS — D631 Anemia in chronic kidney disease: Secondary | ICD-10-CM | POA: Diagnosis not present

## 2015-08-08 DIAGNOSIS — D509 Iron deficiency anemia, unspecified: Secondary | ICD-10-CM | POA: Diagnosis not present

## 2015-08-08 DIAGNOSIS — E1129 Type 2 diabetes mellitus with other diabetic kidney complication: Secondary | ICD-10-CM | POA: Diagnosis not present

## 2015-08-08 DIAGNOSIS — N2581 Secondary hyperparathyroidism of renal origin: Secondary | ICD-10-CM | POA: Diagnosis not present

## 2015-08-10 DIAGNOSIS — N186 End stage renal disease: Secondary | ICD-10-CM | POA: Diagnosis not present

## 2015-08-10 DIAGNOSIS — D631 Anemia in chronic kidney disease: Secondary | ICD-10-CM | POA: Diagnosis not present

## 2015-08-10 DIAGNOSIS — N2581 Secondary hyperparathyroidism of renal origin: Secondary | ICD-10-CM | POA: Diagnosis not present

## 2015-08-10 DIAGNOSIS — D509 Iron deficiency anemia, unspecified: Secondary | ICD-10-CM | POA: Diagnosis not present

## 2015-08-10 DIAGNOSIS — E1129 Type 2 diabetes mellitus with other diabetic kidney complication: Secondary | ICD-10-CM | POA: Diagnosis not present

## 2015-08-12 DIAGNOSIS — D509 Iron deficiency anemia, unspecified: Secondary | ICD-10-CM | POA: Diagnosis not present

## 2015-08-12 DIAGNOSIS — N2581 Secondary hyperparathyroidism of renal origin: Secondary | ICD-10-CM | POA: Diagnosis not present

## 2015-08-12 DIAGNOSIS — D631 Anemia in chronic kidney disease: Secondary | ICD-10-CM | POA: Diagnosis not present

## 2015-08-12 DIAGNOSIS — N186 End stage renal disease: Secondary | ICD-10-CM | POA: Diagnosis not present

## 2015-08-12 DIAGNOSIS — E1129 Type 2 diabetes mellitus with other diabetic kidney complication: Secondary | ICD-10-CM | POA: Diagnosis not present

## 2015-08-13 DIAGNOSIS — E1129 Type 2 diabetes mellitus with other diabetic kidney complication: Secondary | ICD-10-CM | POA: Diagnosis not present

## 2015-08-13 DIAGNOSIS — N186 End stage renal disease: Secondary | ICD-10-CM | POA: Diagnosis not present

## 2015-08-13 DIAGNOSIS — Z992 Dependence on renal dialysis: Secondary | ICD-10-CM | POA: Diagnosis not present

## 2015-08-15 DIAGNOSIS — N2581 Secondary hyperparathyroidism of renal origin: Secondary | ICD-10-CM | POA: Diagnosis not present

## 2015-08-15 DIAGNOSIS — N186 End stage renal disease: Secondary | ICD-10-CM | POA: Diagnosis not present

## 2015-08-15 DIAGNOSIS — D509 Iron deficiency anemia, unspecified: Secondary | ICD-10-CM | POA: Diagnosis not present

## 2015-08-15 DIAGNOSIS — E1129 Type 2 diabetes mellitus with other diabetic kidney complication: Secondary | ICD-10-CM | POA: Diagnosis not present

## 2015-08-15 DIAGNOSIS — D508 Other iron deficiency anemias: Secondary | ICD-10-CM | POA: Diagnosis not present

## 2015-08-15 DIAGNOSIS — D631 Anemia in chronic kidney disease: Secondary | ICD-10-CM | POA: Diagnosis not present

## 2015-08-17 DIAGNOSIS — D509 Iron deficiency anemia, unspecified: Secondary | ICD-10-CM | POA: Diagnosis not present

## 2015-08-17 DIAGNOSIS — N186 End stage renal disease: Secondary | ICD-10-CM | POA: Diagnosis not present

## 2015-08-17 DIAGNOSIS — D508 Other iron deficiency anemias: Secondary | ICD-10-CM | POA: Diagnosis not present

## 2015-08-17 DIAGNOSIS — E1129 Type 2 diabetes mellitus with other diabetic kidney complication: Secondary | ICD-10-CM | POA: Diagnosis not present

## 2015-08-17 DIAGNOSIS — N2581 Secondary hyperparathyroidism of renal origin: Secondary | ICD-10-CM | POA: Diagnosis not present

## 2015-08-17 DIAGNOSIS — D631 Anemia in chronic kidney disease: Secondary | ICD-10-CM | POA: Diagnosis not present

## 2015-08-18 DIAGNOSIS — E668 Other obesity: Secondary | ICD-10-CM | POA: Diagnosis not present

## 2015-08-18 DIAGNOSIS — N186 End stage renal disease: Secondary | ICD-10-CM | POA: Diagnosis not present

## 2015-08-19 DIAGNOSIS — D508 Other iron deficiency anemias: Secondary | ICD-10-CM | POA: Diagnosis not present

## 2015-08-19 DIAGNOSIS — E1129 Type 2 diabetes mellitus with other diabetic kidney complication: Secondary | ICD-10-CM | POA: Diagnosis not present

## 2015-08-19 DIAGNOSIS — D509 Iron deficiency anemia, unspecified: Secondary | ICD-10-CM | POA: Diagnosis not present

## 2015-08-19 DIAGNOSIS — D631 Anemia in chronic kidney disease: Secondary | ICD-10-CM | POA: Diagnosis not present

## 2015-08-19 DIAGNOSIS — N2581 Secondary hyperparathyroidism of renal origin: Secondary | ICD-10-CM | POA: Diagnosis not present

## 2015-08-19 DIAGNOSIS — N186 End stage renal disease: Secondary | ICD-10-CM | POA: Diagnosis not present

## 2015-08-22 DIAGNOSIS — N186 End stage renal disease: Secondary | ICD-10-CM | POA: Diagnosis not present

## 2015-08-22 DIAGNOSIS — N2581 Secondary hyperparathyroidism of renal origin: Secondary | ICD-10-CM | POA: Diagnosis not present

## 2015-08-22 DIAGNOSIS — D631 Anemia in chronic kidney disease: Secondary | ICD-10-CM | POA: Diagnosis not present

## 2015-08-22 DIAGNOSIS — D509 Iron deficiency anemia, unspecified: Secondary | ICD-10-CM | POA: Diagnosis not present

## 2015-08-22 DIAGNOSIS — D508 Other iron deficiency anemias: Secondary | ICD-10-CM | POA: Diagnosis not present

## 2015-08-22 DIAGNOSIS — E1129 Type 2 diabetes mellitus with other diabetic kidney complication: Secondary | ICD-10-CM | POA: Diagnosis not present

## 2015-08-24 DIAGNOSIS — D508 Other iron deficiency anemias: Secondary | ICD-10-CM | POA: Diagnosis not present

## 2015-08-24 DIAGNOSIS — N186 End stage renal disease: Secondary | ICD-10-CM | POA: Diagnosis not present

## 2015-08-24 DIAGNOSIS — D509 Iron deficiency anemia, unspecified: Secondary | ICD-10-CM | POA: Diagnosis not present

## 2015-08-24 DIAGNOSIS — E1129 Type 2 diabetes mellitus with other diabetic kidney complication: Secondary | ICD-10-CM | POA: Diagnosis not present

## 2015-08-24 DIAGNOSIS — N2581 Secondary hyperparathyroidism of renal origin: Secondary | ICD-10-CM | POA: Diagnosis not present

## 2015-08-24 DIAGNOSIS — D631 Anemia in chronic kidney disease: Secondary | ICD-10-CM | POA: Diagnosis not present

## 2015-08-25 DIAGNOSIS — F329 Major depressive disorder, single episode, unspecified: Secondary | ICD-10-CM | POA: Diagnosis not present

## 2015-08-25 DIAGNOSIS — N186 End stage renal disease: Secondary | ICD-10-CM | POA: Diagnosis not present

## 2015-08-26 DIAGNOSIS — D509 Iron deficiency anemia, unspecified: Secondary | ICD-10-CM | POA: Diagnosis not present

## 2015-08-26 DIAGNOSIS — N2581 Secondary hyperparathyroidism of renal origin: Secondary | ICD-10-CM | POA: Diagnosis not present

## 2015-08-26 DIAGNOSIS — D631 Anemia in chronic kidney disease: Secondary | ICD-10-CM | POA: Diagnosis not present

## 2015-08-26 DIAGNOSIS — E1129 Type 2 diabetes mellitus with other diabetic kidney complication: Secondary | ICD-10-CM | POA: Diagnosis not present

## 2015-08-26 DIAGNOSIS — N186 End stage renal disease: Secondary | ICD-10-CM | POA: Diagnosis not present

## 2015-08-26 DIAGNOSIS — D508 Other iron deficiency anemias: Secondary | ICD-10-CM | POA: Diagnosis not present

## 2015-08-28 ENCOUNTER — Inpatient Hospital Stay (HOSPITAL_COMMUNITY)
Admission: AD | Admit: 2015-08-28 | Discharge: 2015-08-30 | DRG: 189 | Disposition: A | Discharge status: Home / Self Care | Payer: Medicare Other | Source: Other Acute Inpatient Hospital | Attending: Internal Medicine | Admitting: Internal Medicine

## 2015-08-28 ENCOUNTER — Encounter (HOSPITAL_COMMUNITY): Payer: Self-pay | Admitting: *Deleted

## 2015-08-28 ENCOUNTER — Inpatient Hospital Stay (HOSPITAL_COMMUNITY): Payer: Medicare Other

## 2015-08-28 DIAGNOSIS — I160 Hypertensive urgency: Secondary | ICD-10-CM | POA: Diagnosis not present

## 2015-08-28 DIAGNOSIS — R0602 Shortness of breath: Secondary | ICD-10-CM | POA: Diagnosis not present

## 2015-08-28 DIAGNOSIS — E669 Obesity, unspecified: Secondary | ICD-10-CM | POA: Diagnosis present

## 2015-08-28 DIAGNOSIS — Z992 Dependence on renal dialysis: Secondary | ICD-10-CM | POA: Diagnosis not present

## 2015-08-28 DIAGNOSIS — Z8249 Family history of ischemic heart disease and other diseases of the circulatory system: Secondary | ICD-10-CM | POA: Diagnosis not present

## 2015-08-28 DIAGNOSIS — R0682 Tachypnea, not elsewhere classified: Secondary | ICD-10-CM | POA: Diagnosis not present

## 2015-08-28 DIAGNOSIS — G629 Polyneuropathy, unspecified: Secondary | ICD-10-CM | POA: Diagnosis not present

## 2015-08-28 DIAGNOSIS — E8770 Fluid overload, unspecified: Secondary | ICD-10-CM | POA: Diagnosis present

## 2015-08-28 DIAGNOSIS — N186 End stage renal disease: Secondary | ICD-10-CM | POA: Diagnosis not present

## 2015-08-28 DIAGNOSIS — D649 Anemia, unspecified: Secondary | ICD-10-CM | POA: Diagnosis present

## 2015-08-28 DIAGNOSIS — J189 Pneumonia, unspecified organism: Secondary | ICD-10-CM | POA: Diagnosis present

## 2015-08-28 DIAGNOSIS — Z87891 Personal history of nicotine dependence: Secondary | ICD-10-CM

## 2015-08-28 DIAGNOSIS — E7800 Pure hypercholesterolemia, unspecified: Secondary | ICD-10-CM | POA: Diagnosis not present

## 2015-08-28 DIAGNOSIS — E1122 Type 2 diabetes mellitus with diabetic chronic kidney disease: Secondary | ICD-10-CM | POA: Diagnosis not present

## 2015-08-28 DIAGNOSIS — J440 Chronic obstructive pulmonary disease with acute lower respiratory infection: Secondary | ICD-10-CM | POA: Diagnosis present

## 2015-08-28 DIAGNOSIS — Z79899 Other long term (current) drug therapy: Secondary | ICD-10-CM | POA: Diagnosis not present

## 2015-08-28 DIAGNOSIS — I10 Essential (primary) hypertension: Secondary | ICD-10-CM | POA: Diagnosis not present

## 2015-08-28 DIAGNOSIS — E039 Hypothyroidism, unspecified: Secondary | ICD-10-CM | POA: Diagnosis not present

## 2015-08-28 DIAGNOSIS — I120 Hypertensive chronic kidney disease with stage 5 chronic kidney disease or end stage renal disease: Secondary | ICD-10-CM | POA: Diagnosis present

## 2015-08-28 DIAGNOSIS — E875 Hyperkalemia: Secondary | ICD-10-CM | POA: Diagnosis not present

## 2015-08-28 DIAGNOSIS — J9601 Acute respiratory failure with hypoxia: Secondary | ICD-10-CM | POA: Diagnosis present

## 2015-08-28 DIAGNOSIS — J810 Acute pulmonary edema: Secondary | ICD-10-CM | POA: Diagnosis not present

## 2015-08-28 DIAGNOSIS — D631 Anemia in chronic kidney disease: Secondary | ICD-10-CM | POA: Diagnosis not present

## 2015-08-28 DIAGNOSIS — I509 Heart failure, unspecified: Secondary | ICD-10-CM | POA: Diagnosis not present

## 2015-08-28 DIAGNOSIS — I739 Peripheral vascular disease, unspecified: Secondary | ICD-10-CM | POA: Diagnosis present

## 2015-08-28 DIAGNOSIS — E119 Type 2 diabetes mellitus without complications: Secondary | ICD-10-CM | POA: Diagnosis not present

## 2015-08-28 HISTORY — DX: Reserved for inherently not codable concepts without codable children: IMO0001

## 2015-08-28 LAB — COMPREHENSIVE METABOLIC PANEL
ALBUMIN: 3.8 g/dL (ref 3.5–5.0)
ALT: 23 U/L (ref 17–63)
ANION GAP: 18 — AB (ref 5–15)
AST: 26 U/L (ref 15–41)
Alkaline Phosphatase: 78 U/L (ref 38–126)
BUN: 69 mg/dL — ABNORMAL HIGH (ref 6–20)
CO2: 23 mmol/L (ref 22–32)
Calcium: 8.1 mg/dL — ABNORMAL LOW (ref 8.9–10.3)
Chloride: 100 mmol/L — ABNORMAL LOW (ref 101–111)
Creatinine, Ser: 11.43 mg/dL — ABNORMAL HIGH (ref 0.61–1.24)
GFR calc Af Amer: 5 mL/min — ABNORMAL LOW (ref >60)
GFR calc non Af Amer: 4 mL/min — ABNORMAL LOW (ref >60)
Glucose, Bld: 227 mg/dL — ABNORMAL HIGH (ref 65–99)
POTASSIUM: 4.8 mmol/L (ref 3.5–5.1)
SODIUM: 141 mmol/L (ref 135–145)
TOTAL PROTEIN: 7.2 g/dL (ref 6.5–8.1)
Total Bilirubin: 0.8 mg/dL (ref 0.3–1.2)

## 2015-08-28 LAB — CBC WITH DIFFERENTIAL/PLATELET
BASOS ABS: 0 10*3/uL (ref 0.0–0.1)
Basophils Relative: 0 %
Eosinophils Absolute: 0 10*3/uL (ref 0.0–0.7)
Eosinophils Relative: 0 %
HEMATOCRIT: 35 % — AB (ref 39.0–52.0)
Hemoglobin: 11.1 g/dL — ABNORMAL LOW (ref 13.0–17.0)
LYMPHS PCT: 7 %
Lymphs Abs: 0.4 10*3/uL — ABNORMAL LOW (ref 0.7–4.0)
MCH: 32 pg (ref 26.0–34.0)
MCHC: 31.7 g/dL (ref 30.0–36.0)
MCV: 100.9 fL — AB (ref 78.0–100.0)
MONO ABS: 0.1 10*3/uL (ref 0.1–1.0)
MONOS PCT: 1 %
NEUTROS ABS: 5.8 10*3/uL (ref 1.7–7.7)
Neutrophils Relative %: 92 %
Platelets: 273 10*3/uL (ref 150–400)
RBC: 3.47 MIL/uL — ABNORMAL LOW (ref 4.22–5.81)
RDW: 15.8 % — ABNORMAL HIGH (ref 11.5–15.5)
WBC: 6.3 10*3/uL (ref 4.0–10.5)

## 2015-08-28 LAB — MRSA PCR SCREENING: MRSA BY PCR: NEGATIVE

## 2015-08-28 LAB — TROPONIN I: Troponin I: 0.08 ng/mL — ABNORMAL HIGH (ref <0.031)

## 2015-08-28 MED ORDER — CARVEDILOL 6.25 MG PO TABS
6.2500 mg | ORAL_TABLET | Freq: Two times a day (BID) | ORAL | Status: DC
  Administered 2015-08-29 – 2015-08-30 (×2): 6.25 mg via ORAL
  Filled 2015-08-28 (×2): qty 1

## 2015-08-28 MED ORDER — HEPARIN SODIUM (PORCINE) 5000 UNIT/ML IJ SOLN
5000.0000 [IU] | Freq: Three times a day (TID) | INTRAMUSCULAR | Status: DC
  Administered 2015-08-29 – 2015-08-30 (×5): 5000 [IU] via SUBCUTANEOUS
  Filled 2015-08-28 (×4): qty 1

## 2015-08-28 MED ORDER — DIPHENHYDRAMINE HCL 25 MG PO CAPS: 25.0000 mg | ORAL_CAPSULE | Freq: Four times a day (QID) | ORAL | Status: DC | PRN

## 2015-08-28 MED ORDER — ACETAMINOPHEN 325 MG PO TABS
650.0000 mg | ORAL_TABLET | Freq: Four times a day (QID) | ORAL | Status: DC | PRN
  Filled 2015-08-28: qty 2

## 2015-08-28 MED ORDER — INSULIN ASPART 100 UNIT/ML ~~LOC~~ SOLN
0.0000 [IU] | Freq: Three times a day (TID) | SUBCUTANEOUS | Status: DC
  Administered 2015-08-29: 2 [IU] via SUBCUTANEOUS
  Administered 2015-08-29: 1 [IU] via SUBCUTANEOUS

## 2015-08-28 MED ORDER — GABAPENTIN 100 MG PO CAPS
100.0000 mg | ORAL_CAPSULE | Freq: Three times a day (TID) | ORAL | Status: DC
  Administered 2015-08-29 – 2015-08-30 (×4): 100 mg via ORAL
  Filled 2015-08-28 (×4): qty 1

## 2015-08-28 MED ORDER — VANCOMYCIN HCL 10 G IV SOLR
2500.0000 mg | Freq: Once | INTRAVENOUS | Status: AC
  Administered 2015-08-29: 2500 mg via INTRAVENOUS
  Filled 2015-08-28: qty 2500

## 2015-08-28 MED ORDER — DIAZEPAM 5 MG PO TABS
10.0000 mg | ORAL_TABLET | Freq: Three times a day (TID) | ORAL | Status: DC
  Administered 2015-08-29 – 2015-08-30 (×4): 10 mg via ORAL
  Filled 2015-08-28 (×4): qty 2

## 2015-08-28 MED ORDER — CINACALCET HCL 30 MG PO TABS
30.0000 mg | ORAL_TABLET | Freq: Every day | ORAL | Status: DC
Start: 2015-08-29 — End: 2015-08-30
  Administered 2015-08-29 – 2015-08-30 (×2): 30 mg via ORAL
  Filled 2015-08-28 (×2): qty 1

## 2015-08-28 MED ORDER — INSULIN GLARGINE 100 UNIT/ML ~~LOC~~ SOLN
80.0000 [IU] | Freq: Every day | SUBCUTANEOUS | Status: DC
  Administered 2015-08-29 (×2): 80 [IU] via SUBCUTANEOUS
  Filled 2015-08-28 (×4): qty 0.8

## 2015-08-28 MED ORDER — ONDANSETRON HCL 4 MG PO TABS: 4.0000 mg | ORAL_TABLET | Freq: Four times a day (QID) | ORAL | Status: DC | PRN

## 2015-08-28 MED ORDER — FLUOXETINE HCL 20 MG PO CAPS
20.0000 mg | ORAL_CAPSULE | Freq: Every day | ORAL | Status: DC
  Administered 2015-08-29 – 2015-08-30 (×2): 20 mg via ORAL
  Filled 2015-08-28 (×2): qty 1

## 2015-08-28 MED ORDER — VANCOMYCIN HCL IN DEXTROSE 1-5 GM/200ML-% IV SOLN
1000.0000 mg | INTRAVENOUS | Status: DC
  Administered 2015-08-29: 1000 mg via INTRAVENOUS
  Filled 2015-08-28 (×2): qty 200

## 2015-08-28 MED ORDER — DEXTROSE 5 % IV SOLN
2.0000 g | INTRAVENOUS | Status: AC
  Administered 2015-08-29: 2 g via INTRAVENOUS
  Filled 2015-08-28: qty 2

## 2015-08-28 MED ORDER — HYDRALAZINE HCL 20 MG/ML IJ SOLN: 10.0000 mg | INTRAMUSCULAR | Status: DC | PRN

## 2015-08-28 MED ORDER — LANTHANUM CARBONATE 500 MG PO CHEW
1000.0000 mg | CHEWABLE_TABLET | Freq: Two times a day (BID) | ORAL | Status: DC
  Administered 2015-08-29 – 2015-08-30 (×2): 1000 mg via ORAL
  Filled 2015-08-28 (×2): qty 2

## 2015-08-28 MED ORDER — DEXTROSE 5 % IV SOLN
2.0000 g | INTRAVENOUS | Status: DC
  Administered 2015-08-29: 2 g via INTRAVENOUS
  Filled 2015-08-28 (×2): qty 2

## 2015-08-28 MED ORDER — ACETAMINOPHEN 650 MG RE SUPP: 650.0000 mg | Freq: Four times a day (QID) | RECTAL | Status: DC | PRN

## 2015-08-28 MED ORDER — TEMAZEPAM 15 MG PO CAPS
30.0000 mg | ORAL_CAPSULE | Freq: Every evening | ORAL | Status: DC | PRN
  Administered 2015-08-29: 30 mg via ORAL
  Filled 2015-08-28: qty 2

## 2015-08-28 MED ORDER — FAMOTIDINE 20 MG PO TABS
20.0000 mg | ORAL_TABLET | Freq: Two times a day (BID) | ORAL | Status: DC
  Administered 2015-08-29 (×2): 20 mg via ORAL
  Filled 2015-08-28 (×2): qty 1

## 2015-08-28 MED ORDER — ONDANSETRON HCL 4 MG/2ML IJ SOLN: 4.0000 mg | Freq: Four times a day (QID) | INTRAMUSCULAR | Status: DC | PRN

## 2015-08-28 MED ORDER — CYCLOBENZAPRINE HCL 10 MG PO TABS: 10.0000 mg | ORAL_TABLET | Freq: Three times a day (TID) | ORAL | Status: DC | PRN

## 2015-08-28 MED ORDER — POLYETHYLENE GLYCOL 3350 17 G PO PACK: 17.0000 g | PACK | Freq: Every day | ORAL | Status: DC | PRN

## 2015-08-28 MED ORDER — MELATONIN 10 MG PO TABS: 10.0000 mg | ORAL_TABLET | Freq: Every day | ORAL | Status: DC

## 2015-08-28 MED ORDER — SODIUM CHLORIDE 0.9 % IJ SOLN
3.0000 mL | Freq: Two times a day (BID) | INTRAMUSCULAR | Status: DC
  Administered 2015-08-29 – 2015-08-30 (×4): 3 mL via INTRAVENOUS

## 2015-08-28 MED ORDER — SIMVASTATIN 40 MG PO TABS
40.0000 mg | ORAL_TABLET | Freq: Every evening | ORAL | Status: DC
  Administered 2015-08-29 (×2): 40 mg via ORAL
  Filled 2015-08-28 (×2): qty 1

## 2015-08-28 NOTE — H&P (Signed)
[facility] History and Physical  [name] [id number] DOB: 1950-08-25 DOA: 08/28/2015  Referring physician: Patient was transferred from [facility]. PCP: Gar Ponto, MD  Specialists: Nephrology.  Chief Complaint: Shortness of breath.  HPI: [name] is a 65 y.o. male history of ESRD on hemodialysis on Monday Wednesday [name], hypertension, diabetes mellitus type 2, chronic anemia was transferred to [facility] for need of dialysis. Patient states patient became short of breath since today morning. Also has been having some nonproductive cough with chest pressure across mostly when he takes deep breath. [name] chest pain-free. Patient was initially placed on BiPAP in the ER at [facility]. After patient's breathing became more better BiPAP was weaned off. On exam patient is mildly short of breath but able to complete sentences without difficulty. Patient states he has benign some nonproductive cough. Denies any fever or chills. X-rays done at [facility] showed possible infiltrates concerning for pneumonia for which patient was started antibiotics empirically. Labs were showing chronic changes with nothing acute. Patient states he has not missed his dialysis.  Review of Systems: As presented in the history of presenting illness, rest negative.  Past Medical History  Diagnosis Date  . Anemia   . Chronic kidney disease   . Anxiety   . Depression   . COPD (chronic obstructive pulmonary disease) ([place])     Pt denies  . Pneumonia     Hx: of  . GERD (gastroesophageal reflux disease)   . H/O hiatal hernia   . Headache(784.0)     Hx: Migraines  . Arthritis   . Numbness of toes     toes and feet  . Diabetes mellitus without complication ([place])   . Hypertension   . Renal insufficiency   . Shortness of breath dyspnea    Past Surgical History  Procedure Laterality Date  . Inguinal hernia repair      ,  times   2  . Tonsillectomy    . Av fistula  placement  2012       left arm   . Colonoscopy  10/26/2011    Procedure: COLONOSCOPY;  Surgeon: Rogene Houston, MD;  [facility];  Service: Endoscopy;  Laterality: N/A;  730  . Knee arthroscopy  2011    Right Knee  . Thrombectomy w/ embolectomy Left 12/11/2012    Procedure: THROMBECTOMY ARTERIOVENOUS FISTULA;  Surgeon: Serafina Mitchell, MD;  Location: [place];  Service: Vascular;  Laterality: Left;  . Av fistula placement Left 12/18/2012    Procedure: ARTERIOVENOUS (AV) FISTULA CREATION;  Surgeon: Angelia Mould, MD;  Location: [place];  Service: Vascular;  Laterality: Left;  . Insertion of dialysis catheter Left 12/18/2012    Procedure: INSERTION OF DIALYSIS CATHETER;  Surgeon: Angelia Mould, MD;  Location[facility] OR;  Service: Vascular;  Laterality: Left;  . Esophagogastroduodenoscopy (egd) with esophageal dilation N/A 04/23/2013    Procedure: ESOPHAGOGASTRODUODENOSCOPY (EGD) WITH ESOPHAGEAL DILATION;  Surgeon: Rogene Houston, MD;  [facility];  Service: Endoscopy;  Laterality: N/A;  200-moved to 930   . Fistula superficialization Left 06/18/2013    Procedure: FISTULA SUPERFICIALIZATION & LIGATION BRANCH X 1;  Surgeon: Mal Misty, MD;  Location: [place];  Service: Vascular;  Laterality: Left;  . Shuntogram N/A 12/09/2012    Procedure: fistulogram;  Surgeon: Serafina Mitchell, MD;  Location: [facility] CATH LAB;  Service: Cardiovascular;  Laterality: N/A;  . Embolectomy Left 12/09/2012    Procedure: EMBOLECTOMY;  Surgeon: Serafina Mitchell, MD;  Location: [facility] CATH LAB;  Service: Cardiovascular;  Laterality: Left;  left arm venous embolization  . Shuntogram Left 06/03/2013    Procedure: Fistulogram;  Surgeon: Serafina Mitchell, MD;  Location: [facility] CATH LAB;  Service: Cardiovascular;  Laterality: Left;   Social History:  reports that he quit smoking about 3 years ago. His smoking use included Cigars. He has never used smokeless tobacco. He reports that he does not drink alcohol or use  illicit drugs. Where does patient live at home. Can patient participate in ADLs? Yes.  Allergies  Allergen Reactions  . Morphine And Related Other (See Comments)    Not effective    Family History:  Family History  Problem Relation Age of Onset  . Heart disease Father     Heart Disease before age 66      Prior to Admission medications   Medication Sig Start Date End Date Taking? Authorizing Provider  carvedilol (COREG) 6.25 MG tablet  06/09/14  Yes Historical Provider, MD  cinacalcet (SENSIPAR) 30 MG tablet Take 30 mg by mouth daily.   Yes Historical Provider, MD  cyclobenzaprine (FLEXERIL) 10 MG tablet Take 10 mg by mouth 3 (three) times daily as needed for muscle spasms.   Yes Historical Provider, MD  diazepam (VALIUM) 10 MG tablet Take 10 mg by mouth 3 (three) times daily.   Yes Historical Provider, MD  diphenhydrAMINE (BENADRYL) 25 mg capsule Take 25 mg by mouth every 6 (six) hours as needed for itching.   Yes Historical Provider, MD  FLUoxetine (PROZAC) 20 MG capsule Take 20 mg by mouth daily.   Yes Historical Provider, MD  gabapentin (NEURONTIN) 100 MG capsule Take 100 mg by mouth 3 (three) times daily.    Yes Historical Provider, MD  Insulin Glargine (TOUJEO SOLOSTAR) 300 UNIT/ML SOPN Inject into the skin.   Yes Historical Provider, MD  Iron Sucrose (VENOFER IV) Inject into the vein. 50mg  once a week on Wednesday.   Yes Historical Provider, MD  lanthanum (FOSRENOL) 1000 MG chewable tablet Chew 1,000 mg by mouth 2 (two) times daily with a meal.    Yes Historical Provider, MD  Melatonin 10 MG TABS Take 10 mg by mouth at bedtime.   Yes Historical Provider, MD  polyethylene glycol (MIRALAX / GLYCOLAX) packet Take 17 g by mouth daily as needed (constipation).    Yes Historical Provider, MD  polyvinyl alcohol (LIQUIFILM TEARS) 1.4 % ophthalmic solution Place 1-2 drops into both eyes as needed (for dry eyes).   Yes Historical Provider, MD  Probiotic Product ([place])  Take 1 capsule by mouth daily as needed (constipation).   Yes Historical Provider, MD  ranitidine (ZANTAC) 150 MG tablet Take 150 mg by mouth daily as needed for heartburn.   Yes Historical Provider, MD  Sevelamer Carbonate (RENVELA PO) Take 2-4 tablets by mouth See admin instructions. 3-4 tabs daily three times daily with meals (depending on what he eats) and 2 tabs daily with snacks.   Yes Historical Provider, MD  simvastatin (ZOCOR) 40 MG tablet Take 40 mg by mouth every evening.   Yes Historical Provider, MD  temazepam (RESTORIL) 30 MG capsule  06/11/14  Yes Historical Provider, MD  B Complex-C-Zn-Folic Acid (DIALYVITE/ZINC PO) Take 1 tablet by mouth daily.    Historical Provider, MD  NONFORMULARY OR COMPOUNDED ITEM 10 mg daily. Domperidone    Historical Provider, MD  oxyCODONE (OXY IR/ROXICODONE) 5 MG immediate release tablet Take 2 tablets (10  mg total) by mouth 4 (four) times daily. 06/18/13   Ulyses Amor, PA-C    Physical Exam: Filed Vitals:   08/28/15 1933  BP: 174/87  Pulse: 102  Temp: 98.6 F (37 C)  TempSrc: Oral  Resp: 22  Height: 6\' 4"  (1.93 m)  Weight: 123.1 kg (271 lb 6.2 oz)  SpO2: 98%     General:  Moderately built and nourished.  Eyes: Anicteric no pallor.  ENT: No discharge from the ears eyes nose normal.  Neck: No neck rigidity no JVD appreciated.  Cardiovascular: S1-S2 heard.  Respiratory: No rhonchi or crepitations.  Abdomen: Soft nontender bowel sounds present. No guarding or rigidity.  Skin: No rash.  Musculoskeletal: Mild edema.  Psychiatric: Appears normal.  Neurologic: Alert awake oriented to time place and person. Moves all extremities.  Labs on Admission:  Basic Metabolic Panel: No results for input(s): NA, K, CL, CO2, GLUCOSE, BUN, CREATININE, CALCIUM, MG, PHOS in the last 168 hours. Liver Function Tests: No results for input(s): AST, ALT, ALKPHOS, BILITOT, PROT, ALBUMIN in the last 168 hours. No results for input(s): LIPASE,  AMYLASE in the last 168 hours. No results for input(s): AMMONIA in the last 168 hours. CBC:  Recent Labs Lab 08/28/15 2209  WBC 6.3  NEUTROABS 5.8  HGB 11.1*  HCT 35.0*  MCV 100.9*  PLT 273   Cardiac Enzymes: No results for input(s): CKTOTAL, CKMB, CKMBINDEX, TROPONINI in the last 168 hours.  BNP (last 3 results) No results for input(s): BNP in the last 8760 hours.  ProBNP (last 3 results) No results for input(s): PROBNP in the last 8760 hours.  CBG: No results for input(s): GLUCAP in the last 168 hours.  Radiological Exams on Admission: No results found.  EKG: Independently reviewed. Normal sinus rhythm with RBBB.  Assessment/Plan Principal Problem:   Acute respiratory failure with hypoxia (HCC) Active Problems:   PVD (peripheral vascular disease) (HCC)   Hypertensive urgency   Chronic anemia   ESRD on dialysis ([place])   Diabetes mellitus with end stage renal disease ([place])   1. Acute respiratory failure with hypoxia probably secondary to fluid overload with possible pneumonia - I have discussed with on-call nephrologist Dr. Jonnie Finner who will be seeing patient in consult for dialysis. I have ordered a repeat chest x-ray. At this time I wouldn't place patient on empiric antibiotics for possible pneumonia and we will check pro-calcitonin level if negative may discontinue antibiotics. 2. Hypertensive urgency - is only on Coreg. I have placed patient on when necessary IV hydralazine in addition. I think patient's blood pressure will improve with dialysis. 3. Diabetes mellitus type 2 - for now I have placed patient on long-acting insulin 80 units at bedtime which patient takes at home along with sliding scale coverage. 4. Chronic anemia probably from ESRD - follow CBC. 5. Peripheral vascular disease no acute issues at this time.   DVT Prophylaxis heparin.  Code Status: Full code.  Family Communication: Discussed with patient.  Disposition Plan: Admit to inpatient.     Sama Arauz N. [facility] Pager [phone].  If 7PM-7AM, please contact night-coverage [email or web address] Password [facility] 08/28/2015, 11:03 PM

## 2015-08-28 NOTE — Progress Notes (Signed)
ANTIBIOTIC CONSULT NOTE - INITIAL  Pharmacy Consult for Vancomycin and Cefepime Indication: pneumonia  Allergies  Allergen Reactions  . Morphine And Related Other (See Comments)    Not effective    Patient Measurements: Height: 6\' 4"  (193 cm) Weight: 271 lb 6.2 oz (123.1 kg) IBW/kg (Calculated) : 86.8  Vital Signs: Temp: 98.6 F (37 C) (01/15 1933) Temp Source: Oral (01/15 1933) BP: 174/87 mmHg (01/15 1933) Pulse Rate: 102 (01/15 1933) Intake/Output from previous day:   Intake/Output from this shift:    Labs:  Recent Labs  08/28/15 2209  WBC 6.3  HGB 11.1*  PLT 273  CREATININE 11.43*   Estimated Creatinine Clearance: 9.2 mL/min (by C-G formula based on Cr of 11.43). No results for input(s): VANCOTROUGH, VANCOPEAK, VANCORANDOM, GENTTROUGH, GENTPEAK, GENTRANDOM, TOBRATROUGH, TOBRAPEAK, TOBRARND, AMIKACINPEAK, AMIKACINTROU, AMIKACIN in the last 72 hours.   Microbiology: Recent Results (from the past 720 hour(s))  MRSA PCR Screening     Status: None   Collection Time: 08/28/15  8:00 PM  Result Value Ref Range Status   MRSA by PCR NEGATIVE NEGATIVE Final    Comment:        The GeneXpert MRSA Assay (FDA approved for NASAL specimens only), is one component of a comprehensive MRSA colonization surveillance program. It is not intended to diagnose MRSA infection nor to guide or monitor treatment for MRSA infections.     Medical History: Past Medical History  Diagnosis Date  . Anemia   . Chronic kidney disease   . Anxiety   . Depression   . COPD (chronic obstructive pulmonary disease) ([place])     Pt denies  . Pneumonia     Hx: of  . GERD (gastroesophageal reflux disease)   . H/O hiatal hernia   . Headache(784.0)     Hx: Migraines  . Arthritis   . Numbness of toes     toes and feet  . Diabetes mellitus without complication ([place])   . Hypertension   . Renal insufficiency   . Shortness of breath dyspnea     Medications:  Prescriptions prior to  admission  Medication Sig Dispense Refill Last Dose  . carvedilol (COREG) 6.25 MG tablet   3 08/27/2015 at 1900  . cinacalcet (SENSIPAR) 30 MG tablet Take 30 mg by mouth daily.   08/26/2015 at 1900  . cyclobenzaprine (FLEXERIL) 10 MG tablet Take 10 mg by mouth 3 (three) times daily as needed for muscle spasms.     . diazepam (VALIUM) 10 MG tablet Take 10 mg by mouth 3 (three) times daily.     . diphenhydrAMINE (BENADRYL) 25 mg capsule Take 25 mg by mouth every 6 (six) hours as needed for itching.   Past Week at Unknown time  . FLUoxetine (PROZAC) 20 MG capsule Take 20 mg by mouth daily.     Marland Kitchen gabapentin (NEURONTIN) 100 MG capsule Take 100 mg by mouth 3 (three) times daily.      . Insulin Glargine (TOUJEO SOLOSTAR) 300 UNIT/ML SOPN Inject into the skin.     . Iron Sucrose (VENOFER IV) Inject into the vein. 50mg  once a week on Wednesday.   Past Month at Unknown time  . lanthanum (FOSRENOL) 1000 MG chewable tablet Chew 1,000 mg by mouth 2 (two) times daily with a meal.      . Melatonin 10 MG TABS Take 10 mg by mouth at bedtime.   Past Week at Unknown time  . polyethylene glycol (MIRALAX / GLYCOLAX) packet Take 17 g by  mouth daily as needed (constipation).      . polyvinyl alcohol (LIQUIFILM TEARS) 1.4 % ophthalmic solution Place 1-2 drops into both eyes as needed (for dry eyes).   Past Week at Unknown time  . Probiotic Product ([facility] [employer] PO) Take 1 capsule by mouth daily as needed (constipation).     . ranitidine (ZANTAC) 150 MG tablet Take 150 mg by mouth daily as needed for heartburn.   Past Week at Unknown time  . Sevelamer Carbonate (RENVELA PO) Take 2-4 tablets by mouth See admin instructions. 3-4 tabs daily three times daily with meals (depending on what he eats) and 2 tabs daily with snacks.   08/27/2015 at 1900  . simvastatin (ZOCOR) 40 MG tablet Take 40 mg by mouth every evening.   08/27/2015 at 1900  . temazepam (RESTORIL) 30 MG capsule   0 08/27/2015 at 1900  . B Complex-C-Zn-Folic  Acid (DIALYVITE/ZINC PO) Take 1 tablet by mouth daily.   Taking  . NONFORMULARY OR COMPOUNDED ITEM 10 mg daily. Domperidone   Taking  . oxyCODONE (OXY IR/ROXICODONE) 5 MG immediate release tablet Take 2 tablets (10 mg total) by mouth 4 (four) times daily. 30 tablet 0 Taking   Assessment: 65 y.o. M presented to [facility] with SOB. CXR suspicious for PNA. Received Rocephin 1gm at [facility] ~1700. Transferred to [facility] for HD as pt with ESRD - usual HD M/W/F as o/p. To broaden abx to Cefepime and Vancomycin for PNA. Afeb. WBC wnl.   Goal of Therapy:  Vanc pre-HD level 15-25 mcg/ml  Plan:  Cefepime 2gm IV now then 2gm with HD (will assume M/W/F schedule) Vancomycin 2500mg  IV now then 1gm IV with HD (will assume M/W/F schedule) Will f/u micro data, renal function, and pt's clinical condition Pre HD vanc level prn  Sherlon Handing, PharmD, BCPS Clinical pharmacist, pager [phone] 08/28/2015,11:43 PM

## 2015-08-29 ENCOUNTER — Inpatient Hospital Stay (HOSPITAL_COMMUNITY): Payer: Medicare Other

## 2015-08-29 DIAGNOSIS — I739 Peripheral vascular disease, unspecified: Secondary | ICD-10-CM

## 2015-08-29 DIAGNOSIS — I160 Hypertensive urgency: Secondary | ICD-10-CM

## 2015-08-29 DIAGNOSIS — E1122 Type 2 diabetes mellitus with diabetic chronic kidney disease: Secondary | ICD-10-CM

## 2015-08-29 DIAGNOSIS — Z992 Dependence on renal dialysis: Secondary | ICD-10-CM

## 2015-08-29 DIAGNOSIS — J9601 Acute respiratory failure with hypoxia: Principal | ICD-10-CM

## 2015-08-29 DIAGNOSIS — N186 End stage renal disease: Secondary | ICD-10-CM

## 2015-08-29 LAB — BASIC METABOLIC PANEL
ANION GAP: 16 — AB (ref 5–15)
Anion gap: 18 — ABNORMAL HIGH (ref 5–15)
BUN: 77 mg/dL — ABNORMAL HIGH (ref 6–20)
BUN: 33 mg/dL — AB (ref 6–20)
CHLORIDE: 102 mmol/L (ref 101–111)
CHLORIDE: 94 mmol/L — AB (ref 101–111)
CO2: 20 mmol/L — AB (ref 22–32)
CO2: 27 mmol/L (ref 22–32)
Calcium: 7.6 mg/dL — ABNORMAL LOW (ref 8.9–10.3)
Calcium: 8.5 mg/dL — ABNORMAL LOW (ref 8.9–10.3)
Creatinine, Ser: 11.8 mg/dL — ABNORMAL HIGH (ref 0.61–1.24)
Creatinine, Ser: 6.49 mg/dL — ABNORMAL HIGH (ref 0.61–1.24)
GFR calc Af Amer: 9 mL/min — ABNORMAL LOW (ref >60)
GFR calc non Af Amer: 4 mL/min — ABNORMAL LOW (ref >60)
GFR calc non Af Amer: 8 mL/min — ABNORMAL LOW (ref >60)
GFR, EST AFRICAN AMERICAN: 5 mL/min — AB (ref >60)
GLUCOSE: 220 mg/dL — AB (ref 65–99)
GLUCOSE: 173 mg/dL — AB (ref 65–99)
POTASSIUM: 3.4 mmol/L — AB (ref 3.5–5.1)
Potassium: 6.1 mmol/L (ref 3.5–5.1)
Sodium: 138 mmol/L (ref 135–145)
Sodium: 139 mmol/L (ref 135–145)

## 2015-08-29 LAB — GLUCOSE, CAPILLARY
GLUCOSE-CAPILLARY: 221 mg/dL — AB (ref 65–99)
Glucose-Capillary: 184 mg/dL — ABNORMAL HIGH (ref 65–99)
Glucose-Capillary: 133 mg/dL — ABNORMAL HIGH (ref 65–99)
Glucose-Capillary: 157 mg/dL — ABNORMAL HIGH (ref 65–99)

## 2015-08-29 LAB — CBC
HCT: 32.9 % — ABNORMAL LOW (ref 39.0–52.0)
HEMOGLOBIN: 10.5 g/dL — AB (ref 13.0–17.0)
MCH: 32.3 pg (ref 26.0–34.0)
MCHC: 31.9 g/dL (ref 30.0–36.0)
MCV: 101.2 fL — AB (ref 78.0–100.0)
Platelets: 305 10*3/uL (ref 150–400)
RBC: 3.25 MIL/uL — AB (ref 4.22–5.81)
RDW: 16 % — ABNORMAL HIGH (ref 11.5–15.5)
WBC: 8.5 10*3/uL (ref 4.0–10.5)

## 2015-08-29 LAB — TROPONIN I
Troponin I: 0.08 ng/mL — ABNORMAL HIGH (ref <0.031)
Troponin I: 0.06 ng/mL — ABNORMAL HIGH (ref <0.031)

## 2015-08-29 LAB — PROCALCITONIN: Procalcitonin: 3.38 ng/mL

## 2015-08-29 MED ORDER — PENTAFLUOROPROP-TETRAFLUOROETH EX AERO: 1.0000 "application " | INHALATION_SPRAY | CUTANEOUS | Status: DC | PRN

## 2015-08-29 MED ORDER — SODIUM CHLORIDE 0.9 % IV SOLN: 100.0000 mL | INTRAVENOUS | Status: DC | PRN

## 2015-08-29 MED ORDER — LIDOCAINE-PRILOCAINE 2.5-2.5 % EX CREA: 1.0000 "application " | TOPICAL_CREAM | CUTANEOUS | Status: DC | PRN

## 2015-08-29 MED ORDER — FAMOTIDINE 20 MG PO TABS: 20.0000 mg | ORAL_TABLET | Freq: Every day | ORAL | Status: DC | PRN

## 2015-08-29 MED ORDER — LIDOCAINE HCL (PF) 1 % IJ SOLN: 5.0000 mL | INTRAMUSCULAR | Status: DC | PRN

## 2015-08-29 MED ORDER — SODIUM CHLORIDE 0.9 % IV SOLN
1.0000 g | Freq: Once | INTRAVENOUS | Status: AC
  Administered 2015-08-29: 1 g via INTRAVENOUS
  Filled 2015-08-29: qty 10

## 2015-08-29 MED ORDER — SODIUM POLYSTYRENE SULFONATE 15 GM/60ML PO SUSP
30.0000 g | Freq: Once | ORAL | Status: AC
  Administered 2015-08-29: 30 g via ORAL
  Filled 2015-08-29: qty 120

## 2015-08-29 MED ORDER — ALTEPLASE 2 MG IJ SOLR
2.0000 mg | Freq: Once | INTRAMUSCULAR | Status: DC | PRN
Start: 2015-08-29 — End: 2015-08-29
  Filled 2015-08-29: qty 2

## 2015-08-29 MED ORDER — HEPARIN SODIUM (PORCINE) 1000 UNIT/ML DIALYSIS: 2000.0000 [IU] | INTRAMUSCULAR | Status: DC | PRN

## 2015-08-29 MED ORDER — CALCITRIOL 0.5 MCG PO CAPS
0.7500 ug | ORAL_CAPSULE | ORAL | Status: DC
  Administered 2015-08-29: 0.75 ug via ORAL
  Filled 2015-08-29: qty 1

## 2015-08-29 MED ORDER — HEPARIN SODIUM (PORCINE) 1000 UNIT/ML DIALYSIS: 1000.0000 [IU] | INTRAMUSCULAR | Status: DC | PRN

## 2015-08-29 NOTE — Progress Notes (Signed)
CRITICAL VALUE ALERT  Critical value received: Potassium level=6.1  Date of notification: 08/29/15  Time of notification:  05:51  Critical value read back:Yes.    Nurse who received alert:  Vinie Sill  MD notified (1st page): L. [place] Time of first page:  05:58 MD notified (2nd page):  Time of second page:  Responding MD:  Roger Shelter  Time MD responded:  06:05

## 2015-08-29 NOTE — Progress Notes (Signed)
Utilization review completed. Zaelyn Noack, RN, BSN. 

## 2015-08-29 NOTE — Procedures (Signed)
Patient was seen on dialysis and the procedure was supervised.  BFR 400  Via AVF BP is  123/70.   Patient appears to be tolerating treatment well- came in with SOB/hypoxia - requiring semi emergent HD for volume- feels better   [name] A 08/29/2015

## 2015-08-29 NOTE — Discharge Summary (Addendum)
Physician Discharge Summary  GURTAJ ALMEIDA [id number] DOB: [birth year] DOA: 08/28/2015  PCP: Gar Ponto, MD  Admit date: 08/28/2015 Discharge date: 08/30/2015  Time spent: 50 minutes  Recommendations for Outpatient Follow-up:  1. Dry weigh 119 kg  Discharge Condition: stable    Discharge Diagnoses:  Principal Problem:   Acute respiratory failure with hypoxia ([place]) Active Problems:   PVD (peripheral vascular disease) ([place])   Hypertensive urgency   Chronic anemia   ESRD on dialysis ([place])   Diabetes mellitus with end stage renal disease ([place])   History of present illness:  [name] is a 65 y.o. male history of ESRD on hemodialysis on Monday Wednesday Friday, hypertension, diabetes mellitus type 2, chronic anemia was transferred to [facility] for need of dialysis. Patient states patient became short of breath since today morning. Also has been having some nonproductive cough with chest pressure across mostly when he takes deep breath. [name] chest pain-free. Patient was initially placed on BiPAP in the ER at [facility]. After patient's breathing became more better BiPAP was weaned off. On exam patient is mildly short of breath but able to complete sentences without difficulty. Patient states he has benign some nonproductive cough. Denies any fever or chills. X-rays done at [facility] showed possible infiltrates concerning for pneumonia for which patient was started antibiotics empirically. Labs were showing chronic changes with nothing acute. Patient states he has not missed his dialysis.  Hospital Course:  Acute resp failure due to fluid overload - resolved with removing 4 L fluid via dialysis today - CXR after dialysis does not show any remaining infiltrates  HTN urgency  - resolved with dialysis  Cough - possible acute bronchitis- repeat CXR after dialysis does not reveal a pneumonia - have started a Z pak  DM2 - cont Toujeo  ESRD - cont dialysis  per renal team    Consultations:  Renal   Discharge Exam: Filed Weights   08/28/15 1933 08/29/15 0750 08/29/15 1158  Weight: 123.1 kg (271 lb 6.2 oz) 123.3 kg (271 lb 13.2 oz) 119.5 kg (263 lb 7.2 oz)   Filed Vitals:   08/30/15 0446 08/30/15 0820  BP: 130/66 141/74  Pulse: 72 81  Temp: 97.5 F (36.4 C) 97.5 F (36.4 C)  Resp: 20 19    General: AAO x 3, no distress Cardiovascular: RRR, no murmurs  Respiratory: clear to auscultation bilaterally GI: soft, non-tender, non-distended, bowel sound positive  Discharge Instructions You were cared for by a hospitalist during your hospital stay. If you have any questions about your discharge medications or the care you received while you were in the hospital after you are discharged, you can call the unit and asked to speak with the hospitalist on call if the hospitalist that took care of you is not available. Once you are discharged, your primary care physician will handle any further medical issues. Please note that NO REFILLS for any discharge medications will be authorized once you are discharged, as it is imperative that you return to your primary care physician (or establish a relationship with a primary care physician if you do not have one) for your aftercare needs so that they can reassess your need for medications and monitor your lab values.      Discharge Instructions    Diet - low sodium heart healthy    Complete by:  As directed      Discharge instructions    Complete by:  As directed   Renal diet  Increase activity slowly    Complete by:  As directed      Increase activity slowly    Complete by:  As directed             Medication List    STOP taking these medications        NONFORMULARY OR COMPOUNDED ITEM     oxyCODONE 5 MG immediate release tablet  Commonly known as:  Oxy IR/ROXICODONE     simvastatin 40 MG tablet  Commonly known as:  ZOCOR      TAKE these medications        atorvastatin 80 MG  tablet  Commonly known as:  LIPITOR  Take 80 mg by mouth at bedtime.     azithromycin 250 MG tablet  Commonly known as:  ZITHROMAX  Take 1 tablet (250 mg total) by mouth daily.  Start taking on:  08/31/2015     carvedilol 6.25 MG tablet  Commonly known as:  COREG  Take 6.25 mg by mouth 2 (two) times daily with a meal.     cyclobenzaprine 10 MG tablet  Commonly known as:  FLEXERIL  Take 10 mg by mouth 3 (three) times daily as needed for muscle spasms.     DIALYVITE/ZINC PO  Take 1 tablet by mouth at bedtime.     diazepam 10 MG tablet  Commonly known as:  VALIUM  Take 10 mg by mouth 3 (three) times daily.     diphenhydrAMINE 25 mg capsule  Commonly known as:  BENADRYL  Take 25 mg by mouth every 6 (six) hours as needed for itching.     DULoxetine 60 MG capsule  Commonly known as:  CYMBALTA  Take 60 mg by mouth at bedtime.     FLUoxetine 40 MG capsule  Commonly known as:  PROZAC  Take 40 mg by mouth at bedtime.     gabapentin 300 MG capsule  Commonly known as:  NEURONTIN  Take 300 mg by mouth 3 (three) times daily.     lanthanum 1000 MG chewable tablet  Commonly known as:  FOSRENOL  Chew 1,000 mg by mouth 3 (three) times daily with meals.     levothyroxine 125 MCG tablet  Commonly known as:  SYNTHROID, LEVOTHROID  Take 125 mcg by mouth at bedtime.     Melatonin 10 MG Tabs  Take 10 mg by mouth at bedtime.     PHILLIPS COLON HEALTH PO  Take 1 capsule by mouth daily as needed (constipation).     polyethylene glycol packet  Commonly known as:  MIRALAX / GLYCOLAX  Take 17 g by mouth daily as needed (constipation).     polyvinyl alcohol 1.4 % ophthalmic solution  Commonly known as:  LIQUIFILM TEARS  Place 1-2 drops into both eyes as needed (for dry eyes).     ranitidine 150 MG tablet  Commonly known as:  ZANTAC  Take 150 mg by mouth daily as needed for heartburn.     RENVELA 800 MG tablet  Generic drug:  sevelamer carbonate  Take 3,200 mg by mouth 3 (three)  times daily with meals. Take 3200 mg also along with snacks     SENSIPAR 60 MG tablet  Generic drug:  cinacalcet  Take 60 mg by mouth at bedtime. Reported on 08/29/2015     temazepam 30 MG capsule  Commonly known as:  RESTORIL  Take 30 mg by mouth at bedtime.     TOUJEO SOLOSTAR 300 UNIT/ML Sopn  Generic drug:  Insulin Glargine  Inject 80 Units into the skin at bedtime.       Allergies  Allergen Reactions  . Morphine And Related Other (See Comments)    Not effective      The results of significant diagnostics from this hospitalization (including imaging, microbiology, ancillary and laboratory) are listed below for reference.    Significant Diagnostic Studies: Dg Chest Port 1 View  08/29/2015  CLINICAL DATA:  65 year old male with pneumonia EXAM: PORTABLE CHEST 1 VIEW COMPARISON:  Prior chest x-ray 08/28/2015 FINDINGS: Stable cardiomegaly. Atherosclerotic calcifications again noted in the transverse aorta significant interval improvement in pulmonary interstitial edema. Decreasing bibasilar opacities likely reflecting reduced edema and atelectasis. No significant residual airspace infiltrate. No pleural effusion or pneumothorax. No acute osseous abnormality. IMPRESSION: 1. Near-total interval resolution of pulmonary edema. 2. Stable cardiomegaly. Electronically Signed   By: Jacqulynn Cadet M.D.   On: 08/29/2015 14:21   Dg Chest Port 1 View  08/28/2015  CLINICAL DATA:  Acute onset of shortness of breath. Initial encounter. EXAM: PORTABLE CHEST 1 VIEW COMPARISON:  Chest radiograph performed earlier today at 04:11 p.m. FINDINGS: The lungs are well-aerated. Vascular congestion is noted. Increased interstitial markings are seen. A small left pleural effusion is suspected. There is no evidence of pneumothorax. The cardiomediastinal silhouette is within normal limits. No acute osseous abnormalities are seen. IMPRESSION: Vascular congestion noted. Increased interstitial markings are somewhat  more prominent than on the prior study, raising question for mild interstitial edema, though pneumonia might have a similar appearance. Suspect small left pleural effusion. Electronically Signed   By: Garald Balding M.D.   On: 08/28/2015 23:24    Microbiology: Recent Results (from the past 240 hour(s))  MRSA PCR Screening     Status: None   Collection Time: 08/28/15  8:00 PM  Result Value Ref Range Status   MRSA by PCR NEGATIVE NEGATIVE Final    Comment:        The GeneXpert MRSA Assay (FDA approved for NASAL specimens only), is one component of a comprehensive MRSA colonization surveillance program. It is not intended to diagnose MRSA infection nor to guide or monitor treatment for MRSA infections.      Labs: Basic Metabolic Panel:  Recent Labs Lab 08/28/15 2209 08/29/15 0426 08/29/15 1358  NA 141 138 139  K 4.8 6.1* 3.4*  CL 100* 102 94*  CO2 23 20* 27  GLUCOSE 227* 220* 173*  BUN 69* 77* 33*  CREATININE 11.43* 11.80* 6.49*  CALCIUM 8.1* 7.6* 8.5*   Liver Function Tests:  Recent Labs Lab 08/28/15 2209  AST 26  ALT 23  ALKPHOS 78  BILITOT 0.8  PROT 7.2  ALBUMIN 3.8   No results for input(s): LIPASE, AMYLASE in the last 168 hours. No results for input(s): AMMONIA in the last 168 hours. CBC:  Recent Labs Lab 08/28/15 2209 08/29/15 0426  WBC 6.3 8.5  NEUTROABS 5.8  --   HGB 11.1* 10.5*  HCT 35.0* 32.9*  MCV 100.9* 101.2*  PLT 273 305   Cardiac Enzymes:  Recent Labs Lab 08/28/15 2209 08/29/15 0426 08/29/15 1358  TROPONINI 0.08* 0.08* 0.06*   BNP: BNP (last 3 results) No results for input(s): BNP in the last 8760 hours.  ProBNP (last 3 results) No results for input(s): PROBNP in the last 8760 hours.  CBG:  Recent Labs Lab 08/29/15 1642 08/29/15 2132 08/30/15 0749 08/30/15 0819 08/30/15 1128  GLUCAP 157* 221* 55* 75 95       Signed:  Debbe Odea, MD [facility] 08/30/2015, 12:24 PM

## 2015-08-29 NOTE — Progress Notes (Signed)
PT Note Pt very unsteady on feet. Will benefit from SNF.  MD paged to update.  Full note to follow. Thanks.   [facility] [phone] (pager)

## 2015-08-29 NOTE — Evaluation (Signed)
Physical Therapy Evaluation Patient Details Name: [name] MRN: [id number] DOB: 30-Sep-1950 Today's Date: 08/29/2015   History of Present Illness  [name] is an 65 y.o. male with past medical history significant for type 2 diabetes mellitus, hypertension, obesity, reported COPD as well as end-stage renal disease on hemodialysis Monday Wednesday Friday at the [place] unit. Patient reports that he's had dyspnea on exertion over the last week that usually improves with resting. On Sunday, his shortness of breath was not relieved with resting so he presented to the emergency department. He was also complaining of a cough but no chest pain. Initially required BiPAP. Chest x-ray consistent with pneumonia versus edema. He was transferred to [facility] for hemodialysis. By the time I saw him, some fluid had been removed and he was feeling better. Patient is compliant with his dialysis. He got 1 kg under his dry weight on 1/13. He does not have an elevated Toniann Dickerson blood count.  Clinical Impression  Pt admitted with above diagnosis. Pt currently with functional limitations due to the deficits listed below (see PT Problem List). Pt able to ambulate but struggling quite a bit with multiple balance issues even with RW.  Had wife ambulate with pt and she states pt is much more difficult to guard and wife really wasn't close enough to pt to asssit him if he loses balance.  Pt has 14 steps to get to his bedroom.  Given pts decline with balance, feel SNF with therapy is warranted and pt and wife are agreeable.  Paged MD to update.  Will follow acutely.   Pt will benefit from skilled PT to increase their independence and safety with mobility to allow discharge to the venue listed below.      Follow Up Recommendations SNF;Supervision/Assistance - 24 hour    Equipment Recommendations  Rolling walker with 5" wheels;3in1 (PT)    Recommendations for Other Services       Precautions / Restrictions  Precautions Precautions: Fall Restrictions Weight Bearing Restrictions: No      Mobility  Bed Mobility Overal bed mobility: Needs Assistance Bed Mobility: Supine to Sit     Supine to sit: Supervision;HOB elevated     General bed mobility comments: Pt able to come to EOB with incr time but did not use rail.  HOB slightly elevated.    Transfers Overall transfer level: Needs assistance Equipment used: Rolling walker (2 wheeled) Transfers: Sit to/from Stand Sit to Stand: Min guard;From elevated surface;Min assist         General transfer comment: Pt was unable to rise from lower surface needing mod assist with incr attempts.  Once bed raised needed min guard to min assist due to posterior lean. Pt could not attain upright stance at times and needed steadying assist due to significant posterior lean that worsened with fatigue.  This PT assisted him and then had wife come over to assist pt to compare how pt mobilized before with how he was doing now and wife agrees he is much worse than when at home.   Ambulation/Gait Ambulation/Gait assistance: Mod assist;+2 safety/equipment Ambulation Distance (Feet): 70 Feet (35 feet x 2) Assistive device: Rolling walker (2 wheeled) Gait Pattern/deviations: Step-through pattern;Decreased step length - right;Decreased step length - left;Decreased stride length;Shuffle;Staggering right;Staggering left;Leaning posteriorly;Trunk flexed;Wide base of support   Gait velocity interpretation: Below normal speed for age/gender General Gait Details: Pt ambulated with unsteady gait overall needing mod assist at times due to posterior lean as well as instability  of bil LEs.  RW stabilized pt somewhat but pt still unsteady needing assit with gait belt a few times with ambulation.  Pts gait worsened as he fatigued which wife states is a common problem.    Stairs            Wheelchair Mobility    Modified Rankin (Stroke Patients Only)       Balance  Overall balance assessment: Needs assistance;History of Falls Sitting-balance support: No upper extremity supported;Feet supported Sitting balance-Leahy Scale: Fair   Postural control: Posterior lean Standing balance support: Bilateral upper extremity supported;During functional activity Standing balance-Leahy Scale: Poor Standing balance comment: Pt cannot stand statically without RW and physical assist as he loses balance posteriorly.               High level balance activites: Direction changes;Turns;Sudden stops High Level Balance Comments: mod assist for stability.              Pertinent Vitals/Pain Pain Assessment: No/denies pain  VSS    Home Living Family/patient expects to be discharged to:: Private residence Living Arrangements: Spouse/significant other Available Help at Discharge: Family;Available 24 hours/day Type of Home: House Home Access: Stairs to enter   [employer] of Steps: 1 Home Layout: Two level;Bed/bath upstairs Home Equipment: Shower seat;Cane - single point      Prior Function Level of Independence: Needs assistance   Gait / Transfers Assistance Needed: wife reports she assisted him more and more recently to prevent falls.  He used cane  ADL's / Homemaking Assistance Needed: assist at times.  Comments: Multiple falls prior to use of cane but recently needing wifes incr assist.      Hand Dominance        Extremity/Trunk Assessment   Upper Extremity Assessment: Defer to OT evaluation           Lower Extremity Assessment: LLE deficits/detail;RLE deficits/detail RLE Deficits / Details: grossly 3-/5 LLE Deficits / Details: grossly 3-/5  Cervical / Trunk Assessment: Kyphotic  Communication   Communication: No difficulties  Cognition Arousal/Alertness: Awake/alert Behavior During Therapy: Flat affect Overall Cognitive Status: Within Functional Limits for tasks assessed                      General Comments       Exercises General Exercises - Lower Extremity Ankle Circles/Pumps: AROM;Both;10 reps;Seated Long Arc Quad: AROM;Both;5 reps;Seated      Assessment/Plan    PT Assessment Patient needs continued PT services  PT Diagnosis Generalized weakness   PT Problem List Decreased activity tolerance;Decreased balance;Decreased mobility;Decreased strength;Decreased coordination;Decreased safety awareness;Decreased knowledge of use of DME;Decreased knowledge of precautions  PT Treatment Interventions DME instruction;Gait training;Functional mobility training;Therapeutic activities;Therapeutic exercise;Balance training;Stair training;Patient/family education   PT Goals (Current goals can be found in the Care Plan section) Acute Rehab PT Goals Patient Stated Goal: to go home PT Goal Formulation: With patient Time For Goal Achievement: 09/12/15 Potential to Achieve Goals: Good    Frequency Min 3X/week   Barriers to discharge        Co-evaluation               End of Session Equipment Utilized During Treatment: Gait belt Activity Tolerance: Patient limited by fatigue Patient left: in bed;with call bell/phone within reach;with bed alarm set;with family/visitor present Nurse Communication: Mobility status         Time: [id number] PT Time Calculation (min) (ACUTE ONLY): 38 min   Charges:   PT Evaluation $PT Eval  Moderate Complexity: 1 Procedure PT Treatments $Gait Training: 8-22 mins $Therapeutic Exercise: 8-22 mins   PT G CodesDenice Paradise 09/06/15, 4:25 PM Elga Santy,PT Acute Rehabilitation [phone] (pager)

## 2015-08-29 NOTE — Consult Note (Signed)
Reason for Consult: To manage dialysis and dialysis related needs Referring Physician: Vidyuth Belsito is an 65 y.o. male with past medical history significant for type 2 diabetes mellitus, hypertension, obesity, reported COPD as well as end-stage renal disease on hemodialysis Monday Wednesday Friday at the [place] unit.  Patient reports that he's had dyspnea on exertion over the last week that usually improves with resting. On Sunday, his shortness of breath was not relieved with resting so he presented to the emergency department. He was also complaining of a cough but no chest pain. Initially required BiPAP. Chest x-ray consistent with pneumonia versus edema. He was transferred to [facility] for hemodialysis. By the time I saw him, some fluid had been removed and he was feeling better. Patient is compliant with his dialysis. He got 1 kg under his dry weight on 1/13.  He does not have an elevated white blood count.   Dialyzes at [place] 120. Time of 4 hours, blood flow rate 425 HD Bath 2K/2.5 calcium, Dialyzer 180, Heparin bolus of 4500. Access aVF.  Last hemoglobin 11 just received Mircera on 1/11- on calcitriol 0.75 every treatment  Past Medical History  Diagnosis Date  . Anemia   . Chronic kidney disease   . Anxiety   . Depression   . COPD (chronic obstructive pulmonary disease) ([place])     Pt denies  . Pneumonia     Hx: of  . GERD (gastroesophageal reflux disease)   . H/O hiatal hernia   . Headache(784.0)     Hx: Migraines  . Arthritis   . Numbness of toes     toes and feet  . Diabetes mellitus without complication ([place])   . Hypertension   . Renal insufficiency   . Shortness of breath dyspnea     Past Surgical History  Procedure Laterality Date  . Inguinal hernia repair      ,  times   2  . Tonsillectomy    . Av fistula placement  2012       left arm   . Colonoscopy  10/26/2011    Procedure: COLONOSCOPY;  Surgeon: Rogene Houston, MD;  [facility]: [facility]  END[facility] SU[facility];  Service: Endoscopy;  Laterality: N/A;  730  . Knee arthroscopy  2011    Right Knee  . Thrombectomy w/ embolectomy Left 12/11/2012    Procedure: THROMBECTOMY ARTERIOVENOUS FISTULA;  Surgeon: Serafina Mitchell, MD;  Location: [place];  Service: Vascular;  Laterality: Left;  . Av fistula placement Left 12/18/2012    Procedure: ARTERIOVENOUS (AV) FISTULA CREATION;  Surgeon: Angelia Mould, MD;  Location: [place];  Service: Vascular;  Laterality: Left;  . Insertion of dialysis catheter Left 12/18/2012    Procedure: INSERTION OF DIALYSIS CATHETER;  Surgeon: Angelia Mould, MD;  Location: [facility] OR;  Service: Vascular;  Laterality: Left;  . Esophagogastroduodenoscopy (egd) with esophageal dilation N/A 04/23/2013    Procedure: ESOPHAGOGASTRODUODENOSCOPY (EGD) WITH ESOPHAGEAL DILATION;  Surgeon: Rogene Houston, MD;  [facility];  Service: Endoscopy;  Laterality: N/A;  200-moved to 930   . Fistula superficialization Left 06/18/2013    Procedure: FISTULA SUPERFICIALIZATION & LIGATION BRANCH X 1;  Surgeon: Mal Misty, MD;  Location: [place];  Service: Vascular;  Laterality: Left;  . Shuntogram N/A 12/09/2012    Procedure: fistulogram;  Surgeon: Serafina Mitchell, MD;  Location: [facility] CATH LAB;  Service: Cardiovascular;  Laterality: N/A;  . Embolectomy Left 12/09/2012  Procedure: EMBOLECTOMY;  Surgeon: Nada Libman, MD;  Location: [facility] CATH LAB;  Service: Cardiovascular;  Laterality: Left;  left arm venous embolization  . Shuntogram Left 06/03/2013    Procedure: Fistulogram;  Surgeon: Nada Libman, MD;  Location: [facility] CATH LAB;  Service: Cardiovascular;  Laterality: Left;    Family History  Problem Relation Age of Onset  . Heart disease Father     Heart Disease before age 39    Social History:  reports that he quit smoking about 3 years ago. His smoking use included Cigars. He has never used smokeless tobacco. He reports that he does not drink alcohol or use illicit  drugs.  Allergies:  Allergies  Allergen Reactions  . Morphine And Related Other (See Comments)    Not effective    Medications: I have reviewed the patient's current medications.   Results for orders placed or performed during the hospital encounter of 08/28/15 (from the past 48 hour(s))  MRSA PCR Screening     Status: None   Collection Time: 08/28/15  8:00 PM  Result Value Ref Range   MRSA by PCR NEGATIVE NEGATIVE    Comment:        The GeneXpert MRSA Assay (FDA approved for NASAL specimens only), is one component of a comprehensive MRSA colonization surveillance program. It is not intended to diagnose MRSA infection nor to guide or monitor treatment for MRSA infections.   Comprehensive metabolic panel     Status: Abnormal   Collection Time: 08/28/15 10:09 PM  Result Value Ref Range   Sodium 141 135 - 145 mmol/L   Potassium 4.8 3.5 - 5.1 mmol/L   Chloride 100 (L) 101 - 111 mmol/L   CO2 23 22 - 32 mmol/L   Glucose, Bld 227 (H) 65 - 99 mg/dL   BUN 69 (H) 6 - 20 mg/dL   Creatinine, Ser 93.61 (H) 0.61 - 1.24 mg/dL   Calcium 8.1 (L) 8.9 - 10.3 mg/dL   Total Protein 7.2 6.5 - 8.1 g/dL   Albumin 3.8 3.5 - 5.0 g/dL   AST 26 15 - 41 U/L   ALT 23 17 - 63 U/L   Alkaline Phosphatase 78 38 - 126 U/L   Total Bilirubin 0.8 0.3 - 1.2 mg/dL   GFR calc non Af Amer 4 (L) >60 mL/min   GFR calc Af Amer 5 (L) >60 mL/min    Comment: (NOTE) The eGFR has been calculated using the CKD EPI equation. This calculation has not been validated in all clinical situations. eGFR's persistently <60 mL/min signify possible Chronic Kidney Disease.    Anion gap 18 (H) 5 - 15  CBC with Differential/Platelet     Status: Abnormal   Collection Time: 08/28/15 10:09 PM  Result Value Ref Range   WBC 6.3 4.0 - 10.5 K/uL   RBC 3.47 (L) 4.22 - 5.81 MIL/uL   Hemoglobin 11.1 (L) 13.0 - 17.0 g/dL   HCT 09.2 (L) 20.7 - 71.4 %   MCV 100.9 (H) 78.0 - 100.0 fL   MCH 32.0 26.0 - 34.0 pg   MCHC 31.7 30.0 -  36.0 g/dL   RDW 14.9 (H) 27.0 - 05.1 %   Platelets 273 150 - 400 K/uL   Neutrophils Relative % 92 %   Neutro Abs 5.8 1.7 - 7.7 K/uL   Lymphocytes Relative 7 %   Lymphs Abs 0.4 (L) 0.7 - 4.0 K/uL   Monocytes Relative 1 %   Monocytes Absolute 0.1 0.1 - 1.0 K/uL  Eosinophils Relative 0 %   Eosinophils Absolute 0.0 0.0 - 0.7 K/uL   Basophils Relative 0 %   Basophils Absolute 0.0 0.0 - 0.1 K/uL  Troponin I (q 6hr x 3)     Status: Abnormal   Collection Time: 08/28/15 10:09 PM  Result Value Ref Range   Troponin I 0.08 (H) <0.031 ng/mL    Comment:        PERSISTENTLY INCREASED TROPONIN VALUES IN THE RANGE OF 0.04-0.49 ng/mL CAN BE SEEN IN:       -UNSTABLE ANGINA       -CONGESTIVE HEART FAILURE       -MYOCARDITIS       -CHEST TRAUMA       -ARRYHTHMIAS       -LATE PRESENTING MYOCARDIAL INFARCTION       -COPD   CLINICAL FOLLOW-UP RECOMMENDED.   Procalcitonin - Baseline     Status: None   Collection Time: 08/28/15 10:09 PM  Result Value Ref Range   Procalcitonin 3.38 ng/mL    Comment:        Interpretation: PCT > 2 ng/mL: Systemic infection (sepsis) is likely, unless other causes are known. (NOTE)         ICU PCT Algorithm               Non ICU PCT Algorithm    ----------------------------     ------------------------------         PCT < 0.25 ng/mL                 PCT < 0.1 ng/mL     Stopping of antibiotics            Stopping of antibiotics       strongly encouraged.               strongly encouraged.    ----------------------------     ------------------------------       PCT level decrease by               PCT < 0.25 ng/mL       >= 80% from peak PCT       OR PCT 0.25 - 0.5 ng/mL          Stopping of antibiotics                                             encouraged.     Stopping of antibiotics           encouraged.    ----------------------------     ------------------------------       PCT level decrease by              PCT >= 0.25 ng/mL       < 80% from peak PCT         AND PCT >= 0.5 ng/mL            Continuing antibiotics                                               encouraged.       Continuing antibiotics            encouraged.    ----------------------------     ------------------------------  PCT level increase compared          PCT > 0.5 ng/mL         with peak PCT AND          PCT >= 0.5 ng/mL             Escalation of antibiotics                                          strongly encouraged.      Escalation of antibiotics        strongly encouraged.   Troponin I (q 6hr x 3)     Status: Abnormal   Collection Time: 08/29/15  4:26 AM  Result Value Ref Range   Troponin I 0.08 (H) <0.031 ng/mL    Comment:        PERSISTENTLY INCREASED TROPONIN VALUES IN THE RANGE OF 0.04-0.49 ng/mL CAN BE SEEN IN:       -UNSTABLE ANGINA       -CONGESTIVE HEART FAILURE       -MYOCARDITIS       -CHEST TRAUMA       -ARRYHTHMIAS       -LATE PRESENTING MYOCARDIAL INFARCTION       -COPD   CLINICAL FOLLOW-UP RECOMMENDED.   Basic metabolic panel     Status: Abnormal   Collection Time: 08/29/15  4:26 AM  Result Value Ref Range   Sodium 138 135 - 145 mmol/L   Potassium 6.1 (HH) 3.5 - 5.1 mmol/L    Comment: CRITICAL RESULT CALLED TO, READ BACK BY AND VERIFIED WITH: C BOKIAGON,RN [id number] WILDERK    Chloride 102 101 - 111 mmol/L   CO2 20 (L) 22 - 32 mmol/L   Glucose, Bld 220 (H) 65 - 99 mg/dL   BUN 77 (H) 6 - 20 mg/dL   Creatinine, Ser 73.07 (H) 0.61 - 1.24 mg/dL   Calcium 7.6 (L) 8.9 - 10.3 mg/dL   GFR calc non Af Amer 4 (L) >60 mL/min   GFR calc Af Amer 5 (L) >60 mL/min    Comment: (NOTE) The eGFR has been calculated using the CKD EPI equation. This calculation has not been validated in all clinical situations. eGFR's persistently <60 mL/min signify possible Chronic Kidney Disease.    Anion gap 16 (H) 5 - 15  CBC     Status: Abnormal   Collection Time: 08/29/15  4:26 AM  Result Value Ref Range   WBC 8.5 4.0 - 10.5 K/uL   RBC 3.25 (L) 4.22 -  5.81 MIL/uL   Hemoglobin 10.5 (L) 13.0 - 17.0 g/dL   HCT 03.3 (L) 77.8 - 07.8 %   MCV 101.2 (H) 78.0 - 100.0 fL   MCH 32.3 26.0 - 34.0 pg   MCHC 31.9 30.0 - 36.0 g/dL   RDW 12.7 (H) 81.0 - 86.0 %   Platelets 305 150 - 400 K/uL  Glucose, capillary     Status: Abnormal   Collection Time: 08/29/15  7:21 AM  Result Value Ref Range   Glucose-Capillary 184 (H) 65 - 99 mg/dL    Dg Chest Port 1 View  08/28/2015  CLINICAL DATA:  Acute onset of shortness of breath. Initial encounter. EXAM: PORTABLE CHEST 1 VIEW COMPARISON:  Chest radiograph performed earlier today at 04:11 p.m. FINDINGS: The lungs are well-aerated. Vascular congestion is noted. Increased interstitial markings are seen.  A small left pleural effusion is suspected. There is no evidence of pneumothorax. The cardiomediastinal silhouette is within normal limits. No acute osseous abnormalities are seen. IMPRESSION: Vascular congestion noted. Increased interstitial markings are somewhat more prominent than on the prior study, raising question for mild interstitial edema, though pneumonia might have a similar appearance. Suspect small left pleural effusion. Electronically Signed   By: Garald Balding M.D.   On: 08/28/2015 23:24    ROS: Positive for shortness of breath and dyspnea on exertion. Negative for chest pain, nausea, vomiting Blood pressure 130/68, pulse 84, temperature 97 F (36.1 C), temperature source Oral, resp. rate 15, height '6\' 4"'$  (1.93 m), weight 123.3 kg (271 lb 13.2 oz), SpO2 100 %. General appearance: alert and mild distress Resp: diminished breath sounds bibasilar Cardio: regular rate and rhythm, S1, S2 normal, no murmur, click, rub or gallop GI: soft, non-tender; bowel sounds normal; no masses,  no organomegaly Extremities: extremities normal, atraumatic, no cyanosis or edema AVF which is currently accessed for dialysis  Assessment/Plan: 65 year old white male with diabetes, hypertension and ESRD. He presents with  shortness of breath 1 shortness of breath- chest x-ray consistent with pneumonia versus edema. I suspect is more of edema. He is only 3.3 kg above his dry weight. It's likely that dry weight needs to be decreased as an outpatient. Being ruled out for cardiac cause. Troponin is 0.08.  Is on antibiotics as well per primary team 2 ESRD: Normally Monday Wednesday Friday the AV fistula. On treatment presently 3 Hypertension: Blood pressure was very high on admission likely due to anxiety and volume. Is better now that on dialysis. On home dose of Coreg. 4. Anemia of ESRD: Does not seem to be an issue at this time. Received iron on 1/9, received Mircera on 1/11- no action needed right now 5. Metabolic Bone Disease: We'll continue calcitriol with treatment. Is on his home meds of Sensipar and Fosrenol  Thank you for this consultation. We will continue to follow with you   Imanii Gosdin A 08/29/2015, 11:48 AM

## 2015-08-30 LAB — GLUCOSE, CAPILLARY
GLUCOSE-CAPILLARY: 55 mg/dL — AB (ref 65–99)
GLUCOSE-CAPILLARY: 75 mg/dL (ref 65–99)
GLUCOSE-CAPILLARY: 95 mg/dL (ref 65–99)

## 2015-08-30 LAB — PROCALCITONIN

## 2015-08-30 MED ORDER — AZITHROMYCIN 500 MG PO TABS
500.0000 mg | ORAL_TABLET | Freq: Every day | ORAL | Status: AC
  Administered 2015-08-30: 500 mg via ORAL
  Filled 2015-08-30: qty 1

## 2015-08-30 MED ORDER — AZITHROMYCIN 250 MG PO TABS: 250.0000 mg | ORAL_TABLET | Freq: Every day | ORAL | Status: DC

## 2015-08-30 MED ORDER — AZITHROMYCIN 500 MG PO TABS
250.0000 mg | ORAL_TABLET | Freq: Every day | ORAL | Status: DC
Start: 2015-08-31 — End: 2015-08-30

## 2015-08-30 MED FILL — AZITHROMYCIN 250 MG TABLET: 250 | 4 days supply | Qty: 4 | Fill #0

## 2015-08-30 NOTE — Progress Notes (Signed)
Hypoglycemic Event  CBG: 55  Treatment: 15 GM carbohydrate snack  Symptoms: None  Follow-up CBG: Time:0819 CBG Result:75  Possible Reasons for Event: Unknown  Comments/MD notified: Dr. Wynelle Cleveland notified. Will continue to monitor.    Theodosia Paling

## 2015-08-30 NOTE — Progress Notes (Signed)
[name] to be D/C'd Home per MD order.  Discussed prescriptions and follow up appointments with the patient. Prescriptions given to patient, medication list explained in detail. Pt verbalized understanding.    Medication List    STOP taking these medications        NONFORMULARY OR COMPOUNDED ITEM     oxyCODONE 5 MG immediate release tablet  Commonly known as:  Oxy IR/ROXICODONE     simvastatin 40 MG tablet  Commonly known as:  ZOCOR      TAKE these medications        atorvastatin 80 MG tablet  Commonly known as:  LIPITOR  Take 80 mg by mouth at bedtime.     azithromycin 250 MG tablet  Commonly known as:  ZITHROMAX  Take 1 tablet (250 mg total) by mouth daily.  Start taking on:  08/31/2015     carvedilol 6.25 MG tablet  Commonly known as:  COREG  Take 6.25 mg by mouth 2 (two) times daily with a meal.     cyclobenzaprine 10 MG tablet  Commonly known as:  FLEXERIL  Take 10 mg by mouth 3 (three) times daily as needed for muscle spasms.     DIALYVITE/ZINC PO  Take 1 tablet by mouth at bedtime.     diazepam 10 MG tablet  Commonly known as:  VALIUM  Take 10 mg by mouth 3 (three) times daily.     diphenhydrAMINE 25 mg capsule  Commonly known as:  BENADRYL  Take 25 mg by mouth every 6 (six) hours as needed for itching.     DULoxetine 60 MG capsule  Commonly known as:  CYMBALTA  Take 60 mg by mouth at bedtime.     FLUoxetine 40 MG capsule  Commonly known as:  PROZAC  Take 40 mg by mouth at bedtime.     gabapentin 300 MG capsule  Commonly known as:  NEURONTIN  Take 300 mg by mouth 3 (three) times daily.     lanthanum 1000 MG chewable tablet  Commonly known as:  FOSRENOL  Chew 1,000 mg by mouth 3 (three) times daily with meals.     levothyroxine 125 MCG tablet  Commonly known as:  SYNTHROID, LEVOTHROID  Take 125 mcg by mouth at bedtime.     Melatonin 10 MG Tabs  Take 10 mg by mouth at bedtime.     PHILLIPS COLON HEALTH PO  Take 1 capsule by mouth daily  as needed (constipation).     polyethylene glycol packet  Commonly known as:  MIRALAX / GLYCOLAX  Take 17 g by mouth daily as needed (constipation).     polyvinyl alcohol 1.4 % ophthalmic solution  Commonly known as:  LIQUIFILM TEARS  Place 1-2 drops into both eyes as needed (for dry eyes).     ranitidine 150 MG tablet  Commonly known as:  ZANTAC  Take 150 mg by mouth daily as needed for heartburn.     RENVELA 800 MG tablet  Generic drug:  sevelamer carbonate  Take 3,200 mg by mouth 3 (three) times daily with meals. Take 3200 mg also along with snacks     SENSIPAR 60 MG tablet  Generic drug:  cinacalcet  Take 60 mg by mouth at bedtime. Reported on 08/29/2015     temazepam 30 MG capsule  Commonly known as:  RESTORIL  Take 30 mg by mouth at bedtime.     TOUJEO SOLOSTAR 300 UNIT/ML Sopn  Generic drug:  Insulin Glargine  Inject 80  Units into the skin at bedtime.        Filed Vitals:   08/30/15 0446 08/30/15 0820  BP: 130/66 141/74  Pulse: 72 81  Temp: 97.5 F (36.4 C) 97.5 F (36.4 C)  Resp: 20 19    Skin clean, dry and intact without evidence of skin break down, no evidence of skin tears noted. IV catheter discontinued intact. Site without signs and symptoms of complications. Dressing and pressure applied. Pt denies pain at this time. No complaints noted.  An After Visit Summary was printed and given to the patient. Patient escorted via [place], and D/C home via private auto.  Retta Mac BSN, RN

## 2015-08-30 NOTE — Progress Notes (Signed)
Physical Therapy Treatment Patient Details Name: [name] MRN: [id number] DOB: Mar 14, 1951 Today's Date: 08/30/2015    History of Present Illness [name] is an 65 y.o. male with past medical history significant for type 2 diabetes mellitus, hypertension, obesity, reported COPD as well as end-stage renal disease on hemodialysis Monday Wednesday Friday at the [place] unit. Patient reports that he's had dyspnea on exertion over the last week that usually improves with resting. On Sunday, his shortness of breath was not relieved with resting so he presented to the emergency department. He was also complaining of a cough but no chest pain. Initially required BiPAP. Chest x-ray consistent with pneumonia versus edema. He was transferred to [facility] for hemodialysis. By the time I saw him, some fluid had been removed and he was feeling better. Patient is compliant with his dialysis. He got 1 kg under his dry weight on 1/13. He does not have an elevated white blood count.    PT Comments    Pt is progressing towards all acute care goals. He displayed increased stability and strength compared to last session and is +1 assist. Pt's able to perform all bed mobility and transfers with Mod I today. Tolerated ambulating in hallway 150 ft with RW as well as a stair trial. Positive for wheezing upon initial standing and walking yet SpO2 96%. Due to fatigue and weakness pt requires Min A to supervision for longer distances. Wife is available to assist pt upon D/C. With pt's improved steadiness recommending D/C home with HHPT and 24/7 supervision/assistance from wife. Would benefit from home safety assessment to prevent future falls and continued strengthening program.    Follow Up Recommendations  Home health PT;Supervision/Assistance - 24 hour     Equipment Recommendations  Rolling walker with 5" wheels;3in1 (PT)    Recommendations for Other Services       Precautions / Restrictions  Precautions Precautions: Fall Restrictions Weight Bearing Restrictions: No    Mobility  Bed Mobility Overal bed mobility: Modified Independent Bed Mobility: Supine to Sit     Supine to sit: Modified independent (Device/Increase time);HOB elevated     General bed mobility comments: Pt able to come to EOB with increased time  HOB slightly elevated.    Transfers Overall transfer level: Modified independent Equipment used: Rolling walker (2 wheeled) Transfers: Sit to/from Stand Sit to Stand: Modified independent (Device/Increase time);Min guard (Increased time and use of rocking for momentum)         General transfer comment: Pt able to move into standing from bed in lowest position using momentum and able to stand steady with use of RW.  Required Min A to stand from chair after ambualting in hallway due to fatigue. Reminded of safety precautions when returning to sitting vs. plopping down quickly  Ambulation/Gait Ambulation/Gait assistance: Supervision;Min guard Ambulation Distance (Feet): 150 Feet Assistive device: Rolling walker (2 wheeled) Gait Pattern/deviations: Drifts right/left;Antalgic;Decreased stride length   Gait velocity interpretation: Below normal speed for age/gender General Gait Details: Pt displays Bil foot drop and veers towards the R with RW. Able to ambulate with min guard progressing to supervision with the use of the RW. One seated rest break for fatigue. Overall increased steadiness.   Stairs Stairs: Yes Stairs assistance: Min assist Stair Management: One rail Left;Step to pattern Number of Stairs: 12 General stair comments: VC's for foot placement and use of hand rail for stair safety  Wheelchair Mobility    Modified Rankin (Stroke Patients Only)  Balance Overall balance assessment: Needs assistance Sitting-balance support: Feet supported;No upper extremity supported Sitting balance-Leahy Scale: Fair   Postural control: Posterior  lean Standing balance support: Bilateral upper extremity supported;During functional activity Standing balance-Leahy Scale: Fair Standing balance comment: Pt requiring VC's to shift weight anteriorly, but able to correct                    Cognition Arousal/Alertness: Awake/alert Behavior During Therapy: WFL for tasks assessed/performed Overall Cognitive Status: Within Functional Limits for tasks assessed                      Exercises      General Comments        Pertinent Vitals/Pain Pain Assessment: No/denies pain    Home Living                      Prior Function            PT Goals (current goals can now be found in the care plan section) Acute Rehab PT Goals Patient Stated Goal: go home Progress towards PT goals: Progressing toward goals    Frequency  Min 3X/week    PT Plan Discharge plan needs to be updated    Co-evaluation             End of Session Equipment Utilized During Treatment: Gait belt Activity Tolerance: Patient limited by fatigue Patient left: in bed;with call bell/phone within reach;with family/visitor present     Time: [id number] PT Time Calculation (min) (ACUTE ONLY): 20 min  Charges:  $Gait Training: 8-22 mins                    G Codes:      Ara Kussmaul September 01, 2015, 3:01 PM  Ara Kussmaul, Student Physical Therapist Acute Rehab [phone]

## 2015-08-30 NOTE — Care Management Note (Addendum)
Case Management Note  Patient Details  Name: [name] MRN: [id number] Date of Birth: 11-25-1950  Subjective/Objective:      CM following for progression and d/c planning.              Action/Plan: 08/30/2015 Met with pt and wife re HH and DME. They selected [facility] for HHPT and [place] aide services. [facility] rep. Butch Penny notified. [place] notified of Rolling walker order   and provided pt ht and wt to verify size of walker needed.   Expected Discharge Date:    08/30/2015             Expected Discharge Plan:  [place]  In-House Referral:  NA  Discharge planning Services  CM Consult  Post Acute Care Choice:  Durable Medical Equipment, Home Health Choice offered to:  Patient  DME Arranged:  Walker rolling DME Agency:  [place] Arranged:  PT, Nurse's Aide [place] Agency:  [place]  Status of Service:  Completed, signed off  Medicare Important Message Given:    Date Medicare IM Given:    Medicare IM give by:    Date Additional Medicare IM Given:    Additional Medicare Important Message give by:     If discussed at [place] of Stay Meetings, dates discussed:    Additional Comments:  Adron Bene, RN 08/30/2015, 1:44 PM

## 2015-08-30 NOTE — Progress Notes (Signed)
[facility] Progress Note  Assessment/Plan: 1. Acute respiratory failure- secondary to fluid excess net UF 4 L on Monday - edw lowered for d/c - CXR clear post HD 2. ESRD - MWF - EDW lower to 119 at dc - continue to challenge 3. Anemia - Hgb 10.5 4. Secondary hyperparathyroidism - continue calcitriol same dose at d/c 5. HTN/volume -  On very low dose coreg, I think we can lower edw more at outpt HD net UF 4 L Monday with post wt 119.5 6. Nutrition - renal diet 7. Disp - anticipate d/c today but if not, HD ordered for Wed first round in case he is not d/c  Myriam Jacobson, PA-C [place] [phone] 08/30/2015,8:40 AM  LOS: 2 days    Renal Attending: Pt improved with fluid removal.  Working with PT Lower EDW planned for dc.  Agree with note as articulated above. Yehudah Standing C    Subjective:   Wants to go home. Not SNF. Quit smoking about 3 yrs ago.  Expiratory wheezes new this am, he says.  Objective Filed Vitals:   08/29/15 1639 08/29/15 2132 08/30/15 0446 08/30/15 0820  BP: 162/64 135/65 130/66 141/74  Pulse: 96 84 72 81  Temp: 97.4 F (36.3 C) 97.6 F (36.4 C) 97.5 F (36.4 C) 97.5 F (36.4 C)  TempSrc: Oral Oral Oral Oral  Resp: 18 18 20 19   Height:      Weight:      SpO2: 99% 98% 98% 100%   Physical Exam General: NAD on room air Heart: RRR Lungs: exp wheezes throughout Abdomen: soft NT Extremities: no sig edema Dialysis Access: left  Upper AVF + bruit  Dialysis Orders: [facility] MWF EDW 120. Time of 4 hours, blood flow rate 425 HD Bath 2K/2.5 calcium, Dialyzer 180, Heparin bolus of 4500. Access aVF. Last hemoglobin 11 just received Mircera on 1/11- on calcitriol 0.75 every treatment  Additional Objective Labs: Basic Metabolic Panel:  Recent Labs Lab 08/28/15 2209 08/29/15 0426 08/29/15 1358  NA 141 138 139  K 4.8 6.1* 3.4*  CL 100* 102 94*  CO2 23 20* 27  GLUCOSE 227* 220* 173*  BUN 69* 77* 33*   CREATININE 11.43* 11.80* 6.49*  CALCIUM 8.1* 7.6* 8.5*   Liver Function Tests:  Recent Labs Lab 08/28/15 2209  AST 26  ALT 23  ALKPHOS 78  BILITOT 0.8  PROT 7.2  ALBUMIN 3.8   CBC:  Recent Labs Lab 08/28/15 2209 08/29/15 0426  WBC 6.3 8.5  NEUTROABS 5.8  --   HGB 11.1* 10.5*  HCT 35.0* 32.9*  MCV 100.9* 101.2*  PLT 273 305  Cardiac Enzymes:  Recent Labs Lab 08/28/15 2209 08/29/15 0426 08/29/15 1358  TROPONINI 0.08* 0.08* 0.06*   CBG:  Recent Labs Lab 08/29/15 1259 08/29/15 1642 08/29/15 2132 08/30/15 0749 08/30/15 0819  GLUCAP 133* 157* 221* 55* 75   Studies/Results: Dg Chest Port 1 View  08/29/2015  CLINICAL DATA:  65 year old male with pneumonia EXAM: PORTABLE CHEST 1 VIEW COMPARISON:  Prior chest x-ray 08/28/2015 FINDINGS: Stable cardiomegaly. Atherosclerotic calcifications again noted in the transverse aorta significant interval improvement in pulmonary interstitial edema. Decreasing bibasilar opacities likely reflecting reduced edema and atelectasis. No significant residual airspace infiltrate. No pleural effusion or pneumothorax. No acute osseous abnormality. IMPRESSION: 1. Near-total interval resolution of pulmonary edema. 2. Stable cardiomegaly. Electronically Signed   By: Jacqulynn Cadet M.D.   On: 08/29/2015 14:21   Dg Chest Port 1 View  08/28/2015  CLINICAL DATA:  Acute onset of shortness of breath. Initial encounter. EXAM: PORTABLE CHEST 1 VIEW COMPARISON:  Chest radiograph performed earlier today at 04:11 p.m. FINDINGS: The lungs are well-aerated. Vascular congestion is noted. Increased interstitial markings are seen. A small left pleural effusion is suspected. There is no evidence of pneumothorax. The cardiomediastinal silhouette is within normal limits. No acute osseous abnormalities are seen. IMPRESSION: Vascular congestion noted. Increased interstitial markings are somewhat more prominent than on the prior study, raising question for mild  interstitial edema, though pneumonia might have a similar appearance. Suspect small left pleural effusion. Electronically Signed   By: Garald Balding M.D.   On: 08/28/2015 23:24   Medications:   . calcitRIOL  0.75 mcg Oral Q M,W,F  . carvedilol  6.25 mg Oral BID WC  . cinacalcet  30 mg Oral Q breakfast  . diazepam  10 mg Oral TID  . FLUoxetine  20 mg Oral Daily  . gabapentin  100 mg Oral TID  . heparin  5,000 Units Subcutaneous 3 times per day  . insulin aspart  0-9 Units Subcutaneous TID WC  . insulin glargine  80 Units Subcutaneous QHS  . lanthanum  1,000 mg Oral BID WC  . simvastatin  40 mg Oral QPM  . sodium chloride  3 mL Intravenous Q12H

## 2015-08-31 DIAGNOSIS — D631 Anemia in chronic kidney disease: Secondary | ICD-10-CM | POA: Diagnosis not present

## 2015-08-31 DIAGNOSIS — D508 Other iron deficiency anemias: Secondary | ICD-10-CM | POA: Diagnosis not present

## 2015-08-31 DIAGNOSIS — E1129 Type 2 diabetes mellitus with other diabetic kidney complication: Secondary | ICD-10-CM | POA: Diagnosis not present

## 2015-08-31 DIAGNOSIS — N2581 Secondary hyperparathyroidism of renal origin: Secondary | ICD-10-CM | POA: Diagnosis not present

## 2015-08-31 DIAGNOSIS — D509 Iron deficiency anemia, unspecified: Secondary | ICD-10-CM | POA: Diagnosis not present

## 2015-08-31 DIAGNOSIS — N186 End stage renal disease: Secondary | ICD-10-CM | POA: Diagnosis not present

## 2015-09-01 DIAGNOSIS — E8770 Fluid overload, unspecified: Secondary | ICD-10-CM | POA: Diagnosis not present

## 2015-09-01 DIAGNOSIS — E1122 Type 2 diabetes mellitus with diabetic chronic kidney disease: Secondary | ICD-10-CM | POA: Diagnosis not present

## 2015-09-01 DIAGNOSIS — Z5181 Encounter for therapeutic drug level monitoring: Secondary | ICD-10-CM | POA: Diagnosis not present

## 2015-09-01 DIAGNOSIS — I739 Peripheral vascular disease, unspecified: Secondary | ICD-10-CM | POA: Diagnosis not present

## 2015-09-01 DIAGNOSIS — N186 End stage renal disease: Secondary | ICD-10-CM | POA: Diagnosis not present

## 2015-09-01 DIAGNOSIS — Z794 Long term (current) use of insulin: Secondary | ICD-10-CM | POA: Diagnosis not present

## 2015-09-01 DIAGNOSIS — I10 Essential (primary) hypertension: Secondary | ICD-10-CM | POA: Diagnosis not present

## 2015-09-01 DIAGNOSIS — J9601 Acute respiratory failure with hypoxia: Secondary | ICD-10-CM | POA: Diagnosis not present

## 2015-09-01 DIAGNOSIS — D649 Anemia, unspecified: Secondary | ICD-10-CM | POA: Diagnosis not present

## 2015-09-01 DIAGNOSIS — Z992 Dependence on renal dialysis: Secondary | ICD-10-CM | POA: Diagnosis not present

## 2015-09-02 DIAGNOSIS — E1129 Type 2 diabetes mellitus with other diabetic kidney complication: Secondary | ICD-10-CM | POA: Diagnosis not present

## 2015-09-02 DIAGNOSIS — D631 Anemia in chronic kidney disease: Secondary | ICD-10-CM | POA: Diagnosis not present

## 2015-09-02 DIAGNOSIS — N2581 Secondary hyperparathyroidism of renal origin: Secondary | ICD-10-CM | POA: Diagnosis not present

## 2015-09-02 DIAGNOSIS — D508 Other iron deficiency anemias: Secondary | ICD-10-CM | POA: Diagnosis not present

## 2015-09-02 DIAGNOSIS — N186 End stage renal disease: Secondary | ICD-10-CM | POA: Diagnosis not present

## 2015-09-02 DIAGNOSIS — D509 Iron deficiency anemia, unspecified: Secondary | ICD-10-CM | POA: Diagnosis not present

## 2015-09-05 DIAGNOSIS — J440 Chronic obstructive pulmonary disease with acute lower respiratory infection: Secondary | ICD-10-CM | POA: Diagnosis not present

## 2015-09-05 DIAGNOSIS — D509 Iron deficiency anemia, unspecified: Secondary | ICD-10-CM | POA: Diagnosis not present

## 2015-09-05 DIAGNOSIS — D508 Other iron deficiency anemias: Secondary | ICD-10-CM | POA: Diagnosis not present

## 2015-09-05 DIAGNOSIS — D631 Anemia in chronic kidney disease: Secondary | ICD-10-CM | POA: Diagnosis not present

## 2015-09-05 DIAGNOSIS — N186 End stage renal disease: Secondary | ICD-10-CM | POA: Diagnosis not present

## 2015-09-05 DIAGNOSIS — N2581 Secondary hyperparathyroidism of renal origin: Secondary | ICD-10-CM | POA: Diagnosis not present

## 2015-09-05 DIAGNOSIS — E1129 Type 2 diabetes mellitus with other diabetic kidney complication: Secondary | ICD-10-CM | POA: Diagnosis not present

## 2015-09-05 MED FILL — IPRAT-ALBUT 0.5-3(2.5) MG/3: 0.5-2.5 (3) | 22 days supply | Qty: 90 | Fill #0

## 2015-09-07 DIAGNOSIS — E1129 Type 2 diabetes mellitus with other diabetic kidney complication: Secondary | ICD-10-CM | POA: Diagnosis not present

## 2015-09-07 DIAGNOSIS — N186 End stage renal disease: Secondary | ICD-10-CM | POA: Diagnosis not present

## 2015-09-07 DIAGNOSIS — D509 Iron deficiency anemia, unspecified: Secondary | ICD-10-CM | POA: Diagnosis not present

## 2015-09-07 DIAGNOSIS — D631 Anemia in chronic kidney disease: Secondary | ICD-10-CM | POA: Diagnosis not present

## 2015-09-07 DIAGNOSIS — D508 Other iron deficiency anemias: Secondary | ICD-10-CM | POA: Diagnosis not present

## 2015-09-07 DIAGNOSIS — N2581 Secondary hyperparathyroidism of renal origin: Secondary | ICD-10-CM | POA: Diagnosis not present

## 2015-09-07 MED FILL — diazePAM 10 MG TABS: 10 | 30 days supply | Qty: 90 | Fill #0

## 2015-09-08 DIAGNOSIS — I739 Peripheral vascular disease, unspecified: Secondary | ICD-10-CM | POA: Diagnosis not present

## 2015-09-08 DIAGNOSIS — J9601 Acute respiratory failure with hypoxia: Secondary | ICD-10-CM | POA: Diagnosis not present

## 2015-09-08 DIAGNOSIS — E8770 Fluid overload, unspecified: Secondary | ICD-10-CM | POA: Diagnosis not present

## 2015-09-08 DIAGNOSIS — N186 End stage renal disease: Secondary | ICD-10-CM | POA: Diagnosis not present

## 2015-09-08 DIAGNOSIS — I10 Essential (primary) hypertension: Secondary | ICD-10-CM | POA: Diagnosis not present

## 2015-09-08 DIAGNOSIS — E1122 Type 2 diabetes mellitus with diabetic chronic kidney disease: Secondary | ICD-10-CM | POA: Diagnosis not present

## 2015-09-09 DIAGNOSIS — D509 Iron deficiency anemia, unspecified: Secondary | ICD-10-CM | POA: Diagnosis not present

## 2015-09-09 DIAGNOSIS — N2581 Secondary hyperparathyroidism of renal origin: Secondary | ICD-10-CM | POA: Diagnosis not present

## 2015-09-09 DIAGNOSIS — E1129 Type 2 diabetes mellitus with other diabetic kidney complication: Secondary | ICD-10-CM | POA: Diagnosis not present

## 2015-09-09 DIAGNOSIS — D508 Other iron deficiency anemias: Secondary | ICD-10-CM | POA: Diagnosis not present

## 2015-09-09 DIAGNOSIS — D631 Anemia in chronic kidney disease: Secondary | ICD-10-CM | POA: Diagnosis not present

## 2015-09-09 DIAGNOSIS — N186 End stage renal disease: Secondary | ICD-10-CM | POA: Diagnosis not present

## 2015-09-12 DIAGNOSIS — N186 End stage renal disease: Secondary | ICD-10-CM | POA: Diagnosis not present

## 2015-09-12 DIAGNOSIS — D631 Anemia in chronic kidney disease: Secondary | ICD-10-CM | POA: Diagnosis not present

## 2015-09-12 DIAGNOSIS — E1129 Type 2 diabetes mellitus with other diabetic kidney complication: Secondary | ICD-10-CM | POA: Diagnosis not present

## 2015-09-12 DIAGNOSIS — D508 Other iron deficiency anemias: Secondary | ICD-10-CM | POA: Diagnosis not present

## 2015-09-12 DIAGNOSIS — D509 Iron deficiency anemia, unspecified: Secondary | ICD-10-CM | POA: Diagnosis not present

## 2015-09-12 DIAGNOSIS — N2581 Secondary hyperparathyroidism of renal origin: Secondary | ICD-10-CM | POA: Diagnosis not present

## 2015-09-12 MED FILL — DIALYVITE TABLET: 30 days supply | Qty: 30 | Fill #10

## 2015-09-13 DIAGNOSIS — E1122 Type 2 diabetes mellitus with diabetic chronic kidney disease: Secondary | ICD-10-CM | POA: Diagnosis not present

## 2015-09-13 DIAGNOSIS — E1129 Type 2 diabetes mellitus with other diabetic kidney complication: Secondary | ICD-10-CM | POA: Diagnosis not present

## 2015-09-13 DIAGNOSIS — I10 Essential (primary) hypertension: Secondary | ICD-10-CM | POA: Diagnosis not present

## 2015-09-13 DIAGNOSIS — I739 Peripheral vascular disease, unspecified: Secondary | ICD-10-CM | POA: Diagnosis not present

## 2015-09-13 DIAGNOSIS — E8770 Fluid overload, unspecified: Secondary | ICD-10-CM | POA: Diagnosis not present

## 2015-09-13 DIAGNOSIS — J9601 Acute respiratory failure with hypoxia: Secondary | ICD-10-CM | POA: Diagnosis not present

## 2015-09-13 DIAGNOSIS — Z992 Dependence on renal dialysis: Secondary | ICD-10-CM | POA: Diagnosis not present

## 2015-09-13 DIAGNOSIS — N186 End stage renal disease: Secondary | ICD-10-CM | POA: Diagnosis not present

## 2015-09-14 DIAGNOSIS — N2581 Secondary hyperparathyroidism of renal origin: Secondary | ICD-10-CM | POA: Diagnosis not present

## 2015-09-14 DIAGNOSIS — D508 Other iron deficiency anemias: Secondary | ICD-10-CM | POA: Diagnosis not present

## 2015-09-14 DIAGNOSIS — J9601 Acute respiratory failure with hypoxia: Secondary | ICD-10-CM | POA: Diagnosis not present

## 2015-09-14 DIAGNOSIS — E8770 Fluid overload, unspecified: Secondary | ICD-10-CM | POA: Diagnosis not present

## 2015-09-14 DIAGNOSIS — E1129 Type 2 diabetes mellitus with other diabetic kidney complication: Secondary | ICD-10-CM | POA: Diagnosis not present

## 2015-09-14 DIAGNOSIS — N186 End stage renal disease: Secondary | ICD-10-CM | POA: Diagnosis not present

## 2015-09-14 DIAGNOSIS — E1122 Type 2 diabetes mellitus with diabetic chronic kidney disease: Secondary | ICD-10-CM | POA: Diagnosis not present

## 2015-09-14 DIAGNOSIS — D509 Iron deficiency anemia, unspecified: Secondary | ICD-10-CM | POA: Diagnosis not present

## 2015-09-14 DIAGNOSIS — D631 Anemia in chronic kidney disease: Secondary | ICD-10-CM | POA: Diagnosis not present

## 2015-09-14 MED FILL — FOSRENOL 1,000 MG TAB CHEW: 1000 | 90 days supply | Qty: 450 | Fill #0

## 2015-09-14 MED FILL — RENVELA 800 MG TABLET: 800 | 30 days supply | Qty: 420 | Fill #2

## 2015-09-15 DIAGNOSIS — E1122 Type 2 diabetes mellitus with diabetic chronic kidney disease: Secondary | ICD-10-CM | POA: Diagnosis not present

## 2015-09-15 DIAGNOSIS — Z136 Encounter for screening for cardiovascular disorders: Secondary | ICD-10-CM | POA: Diagnosis not present

## 2015-09-15 DIAGNOSIS — I10 Essential (primary) hypertension: Secondary | ICD-10-CM | POA: Diagnosis not present

## 2015-09-15 DIAGNOSIS — E8770 Fluid overload, unspecified: Secondary | ICD-10-CM | POA: Diagnosis not present

## 2015-09-15 DIAGNOSIS — I120 Hypertensive chronic kidney disease with stage 5 chronic kidney disease or end stage renal disease: Secondary | ICD-10-CM | POA: Diagnosis not present

## 2015-09-15 DIAGNOSIS — J9601 Acute respiratory failure with hypoxia: Secondary | ICD-10-CM | POA: Diagnosis not present

## 2015-09-15 DIAGNOSIS — I739 Peripheral vascular disease, unspecified: Secondary | ICD-10-CM | POA: Diagnosis not present

## 2015-09-15 DIAGNOSIS — N186 End stage renal disease: Secondary | ICD-10-CM | POA: Diagnosis not present

## 2015-09-16 DIAGNOSIS — D631 Anemia in chronic kidney disease: Secondary | ICD-10-CM | POA: Diagnosis not present

## 2015-09-16 DIAGNOSIS — E1129 Type 2 diabetes mellitus with other diabetic kidney complication: Secondary | ICD-10-CM | POA: Diagnosis not present

## 2015-09-16 DIAGNOSIS — N2581 Secondary hyperparathyroidism of renal origin: Secondary | ICD-10-CM | POA: Diagnosis not present

## 2015-09-16 DIAGNOSIS — N186 End stage renal disease: Secondary | ICD-10-CM | POA: Diagnosis not present

## 2015-09-16 DIAGNOSIS — D509 Iron deficiency anemia, unspecified: Secondary | ICD-10-CM | POA: Diagnosis not present

## 2015-09-16 DIAGNOSIS — D508 Other iron deficiency anemias: Secondary | ICD-10-CM | POA: Diagnosis not present

## 2015-09-18 DIAGNOSIS — N186 End stage renal disease: Secondary | ICD-10-CM | POA: Diagnosis not present

## 2015-09-18 DIAGNOSIS — E668 Other obesity: Secondary | ICD-10-CM | POA: Diagnosis not present

## 2015-09-19 DIAGNOSIS — D509 Iron deficiency anemia, unspecified: Secondary | ICD-10-CM | POA: Diagnosis not present

## 2015-09-19 DIAGNOSIS — N2581 Secondary hyperparathyroidism of renal origin: Secondary | ICD-10-CM | POA: Diagnosis not present

## 2015-09-19 DIAGNOSIS — N186 End stage renal disease: Secondary | ICD-10-CM | POA: Diagnosis not present

## 2015-09-19 DIAGNOSIS — D631 Anemia in chronic kidney disease: Secondary | ICD-10-CM | POA: Diagnosis not present

## 2015-09-19 DIAGNOSIS — E1129 Type 2 diabetes mellitus with other diabetic kidney complication: Secondary | ICD-10-CM | POA: Diagnosis not present

## 2015-09-19 DIAGNOSIS — D508 Other iron deficiency anemias: Secondary | ICD-10-CM | POA: Diagnosis not present

## 2015-09-20 DIAGNOSIS — E1122 Type 2 diabetes mellitus with diabetic chronic kidney disease: Secondary | ICD-10-CM | POA: Diagnosis not present

## 2015-09-20 DIAGNOSIS — I10 Essential (primary) hypertension: Secondary | ICD-10-CM | POA: Diagnosis not present

## 2015-09-20 DIAGNOSIS — I739 Peripheral vascular disease, unspecified: Secondary | ICD-10-CM | POA: Diagnosis not present

## 2015-09-20 DIAGNOSIS — J9601 Acute respiratory failure with hypoxia: Secondary | ICD-10-CM | POA: Diagnosis not present

## 2015-09-20 DIAGNOSIS — N186 End stage renal disease: Secondary | ICD-10-CM | POA: Diagnosis not present

## 2015-09-20 DIAGNOSIS — E8770 Fluid overload, unspecified: Secondary | ICD-10-CM | POA: Diagnosis not present

## 2015-09-21 DIAGNOSIS — D631 Anemia in chronic kidney disease: Secondary | ICD-10-CM | POA: Diagnosis not present

## 2015-09-21 DIAGNOSIS — D509 Iron deficiency anemia, unspecified: Secondary | ICD-10-CM | POA: Diagnosis not present

## 2015-09-21 DIAGNOSIS — N186 End stage renal disease: Secondary | ICD-10-CM | POA: Diagnosis not present

## 2015-09-21 DIAGNOSIS — E1129 Type 2 diabetes mellitus with other diabetic kidney complication: Secondary | ICD-10-CM | POA: Diagnosis not present

## 2015-09-21 DIAGNOSIS — N2581 Secondary hyperparathyroidism of renal origin: Secondary | ICD-10-CM | POA: Diagnosis not present

## 2015-09-21 DIAGNOSIS — D508 Other iron deficiency anemias: Secondary | ICD-10-CM | POA: Diagnosis not present

## 2015-09-22 DIAGNOSIS — E1122 Type 2 diabetes mellitus with diabetic chronic kidney disease: Secondary | ICD-10-CM | POA: Diagnosis not present

## 2015-09-22 DIAGNOSIS — I120 Hypertensive chronic kidney disease with stage 5 chronic kidney disease or end stage renal disease: Secondary | ICD-10-CM | POA: Diagnosis not present

## 2015-09-22 DIAGNOSIS — Z992 Dependence on renal dialysis: Secondary | ICD-10-CM | POA: Diagnosis not present

## 2015-09-22 DIAGNOSIS — F329 Major depressive disorder, single episode, unspecified: Secondary | ICD-10-CM | POA: Diagnosis not present

## 2015-09-23 DIAGNOSIS — I4519 Other right bundle-branch block: Secondary | ICD-10-CM | POA: Diagnosis not present

## 2015-09-23 MED FILL — TOUJEO SOLOSTAR 300 UNITS/M: 300 | 28 days supply | Qty: 9 | Fill #6

## 2015-09-24 DIAGNOSIS — N2581 Secondary hyperparathyroidism of renal origin: Secondary | ICD-10-CM | POA: Diagnosis not present

## 2015-09-24 DIAGNOSIS — D509 Iron deficiency anemia, unspecified: Secondary | ICD-10-CM | POA: Diagnosis not present

## 2015-09-24 DIAGNOSIS — N186 End stage renal disease: Secondary | ICD-10-CM | POA: Diagnosis not present

## 2015-09-24 DIAGNOSIS — D508 Other iron deficiency anemias: Secondary | ICD-10-CM | POA: Diagnosis not present

## 2015-09-24 DIAGNOSIS — D631 Anemia in chronic kidney disease: Secondary | ICD-10-CM | POA: Diagnosis not present

## 2015-09-24 DIAGNOSIS — E1129 Type 2 diabetes mellitus with other diabetic kidney complication: Secondary | ICD-10-CM | POA: Diagnosis not present

## 2015-09-26 DIAGNOSIS — E1129 Type 2 diabetes mellitus with other diabetic kidney complication: Secondary | ICD-10-CM | POA: Diagnosis not present

## 2015-09-26 DIAGNOSIS — D508 Other iron deficiency anemias: Secondary | ICD-10-CM | POA: Diagnosis not present

## 2015-09-26 DIAGNOSIS — D631 Anemia in chronic kidney disease: Secondary | ICD-10-CM | POA: Diagnosis not present

## 2015-09-26 DIAGNOSIS — D509 Iron deficiency anemia, unspecified: Secondary | ICD-10-CM | POA: Diagnosis not present

## 2015-09-26 DIAGNOSIS — N186 End stage renal disease: Secondary | ICD-10-CM | POA: Diagnosis not present

## 2015-09-26 DIAGNOSIS — N2581 Secondary hyperparathyroidism of renal origin: Secondary | ICD-10-CM | POA: Diagnosis not present

## 2015-09-27 DIAGNOSIS — Z992 Dependence on renal dialysis: Secondary | ICD-10-CM | POA: Diagnosis not present

## 2015-09-27 DIAGNOSIS — E1140 Type 2 diabetes mellitus with diabetic neuropathy, unspecified: Secondary | ICD-10-CM | POA: Diagnosis not present

## 2015-09-27 DIAGNOSIS — J9601 Acute respiratory failure with hypoxia: Secondary | ICD-10-CM | POA: Diagnosis not present

## 2015-09-27 DIAGNOSIS — Z87891 Personal history of nicotine dependence: Secondary | ICD-10-CM | POA: Diagnosis not present

## 2015-09-27 DIAGNOSIS — Z794 Long term (current) use of insulin: Secondary | ICD-10-CM | POA: Diagnosis not present

## 2015-09-27 DIAGNOSIS — E7800 Pure hypercholesterolemia, unspecified: Secondary | ICD-10-CM | POA: Diagnosis not present

## 2015-09-27 DIAGNOSIS — E8770 Fluid overload, unspecified: Secondary | ICD-10-CM | POA: Diagnosis not present

## 2015-09-27 DIAGNOSIS — N186 End stage renal disease: Secondary | ICD-10-CM | POA: Diagnosis not present

## 2015-09-27 DIAGNOSIS — I120 Hypertensive chronic kidney disease with stage 5 chronic kidney disease or end stage renal disease: Secondary | ICD-10-CM | POA: Diagnosis not present

## 2015-09-27 DIAGNOSIS — E1122 Type 2 diabetes mellitus with diabetic chronic kidney disease: Secondary | ICD-10-CM | POA: Diagnosis not present

## 2015-09-27 DIAGNOSIS — W01198A Fall on same level from slipping, tripping and stumbling with subsequent striking against other object, initial encounter: Secondary | ICD-10-CM | POA: Diagnosis not present

## 2015-09-27 DIAGNOSIS — S199XXA Unspecified injury of neck, initial encounter: Secondary | ICD-10-CM | POA: Diagnosis not present

## 2015-09-27 DIAGNOSIS — S0990XA Unspecified injury of head, initial encounter: Secondary | ICD-10-CM | POA: Diagnosis not present

## 2015-09-27 DIAGNOSIS — S0191XA Laceration without foreign body of unspecified part of head, initial encounter: Secondary | ICD-10-CM | POA: Diagnosis not present

## 2015-09-27 DIAGNOSIS — I739 Peripheral vascular disease, unspecified: Secondary | ICD-10-CM | POA: Diagnosis not present

## 2015-09-27 DIAGNOSIS — E039 Hypothyroidism, unspecified: Secondary | ICD-10-CM | POA: Diagnosis not present

## 2015-09-27 DIAGNOSIS — Z23 Encounter for immunization: Secondary | ICD-10-CM | POA: Diagnosis not present

## 2015-09-27 DIAGNOSIS — I10 Essential (primary) hypertension: Secondary | ICD-10-CM | POA: Diagnosis not present

## 2015-09-27 DIAGNOSIS — Z79899 Other long term (current) drug therapy: Secondary | ICD-10-CM | POA: Diagnosis not present

## 2015-09-27 DIAGNOSIS — S0181XA Laceration without foreign body of other part of head, initial encounter: Secondary | ICD-10-CM | POA: Diagnosis not present

## 2015-09-28 DIAGNOSIS — D509 Iron deficiency anemia, unspecified: Secondary | ICD-10-CM | POA: Diagnosis not present

## 2015-09-28 DIAGNOSIS — D508 Other iron deficiency anemias: Secondary | ICD-10-CM | POA: Diagnosis not present

## 2015-09-28 DIAGNOSIS — E1129 Type 2 diabetes mellitus with other diabetic kidney complication: Secondary | ICD-10-CM | POA: Diagnosis not present

## 2015-09-28 DIAGNOSIS — N2581 Secondary hyperparathyroidism of renal origin: Secondary | ICD-10-CM | POA: Diagnosis not present

## 2015-09-28 DIAGNOSIS — N186 End stage renal disease: Secondary | ICD-10-CM | POA: Diagnosis not present

## 2015-09-28 DIAGNOSIS — D631 Anemia in chronic kidney disease: Secondary | ICD-10-CM | POA: Diagnosis not present

## 2015-09-29 DIAGNOSIS — I739 Peripheral vascular disease, unspecified: Secondary | ICD-10-CM | POA: Diagnosis not present

## 2015-09-29 DIAGNOSIS — J9601 Acute respiratory failure with hypoxia: Secondary | ICD-10-CM | POA: Diagnosis not present

## 2015-09-29 DIAGNOSIS — E8770 Fluid overload, unspecified: Secondary | ICD-10-CM | POA: Diagnosis not present

## 2015-09-29 DIAGNOSIS — N186 End stage renal disease: Secondary | ICD-10-CM | POA: Diagnosis not present

## 2015-09-29 DIAGNOSIS — I10 Essential (primary) hypertension: Secondary | ICD-10-CM | POA: Diagnosis not present

## 2015-09-29 DIAGNOSIS — E1122 Type 2 diabetes mellitus with diabetic chronic kidney disease: Secondary | ICD-10-CM | POA: Diagnosis not present

## 2015-09-30 DIAGNOSIS — D509 Iron deficiency anemia, unspecified: Secondary | ICD-10-CM | POA: Diagnosis not present

## 2015-09-30 DIAGNOSIS — E1129 Type 2 diabetes mellitus with other diabetic kidney complication: Secondary | ICD-10-CM | POA: Diagnosis not present

## 2015-09-30 DIAGNOSIS — D631 Anemia in chronic kidney disease: Secondary | ICD-10-CM | POA: Diagnosis not present

## 2015-09-30 DIAGNOSIS — N2581 Secondary hyperparathyroidism of renal origin: Secondary | ICD-10-CM | POA: Diagnosis not present

## 2015-09-30 DIAGNOSIS — D508 Other iron deficiency anemias: Secondary | ICD-10-CM | POA: Diagnosis not present

## 2015-09-30 DIAGNOSIS — N186 End stage renal disease: Secondary | ICD-10-CM | POA: Diagnosis not present

## 2015-10-03 DIAGNOSIS — D508 Other iron deficiency anemias: Secondary | ICD-10-CM | POA: Diagnosis not present

## 2015-10-03 DIAGNOSIS — D509 Iron deficiency anemia, unspecified: Secondary | ICD-10-CM | POA: Diagnosis not present

## 2015-10-03 DIAGNOSIS — N2581 Secondary hyperparathyroidism of renal origin: Secondary | ICD-10-CM | POA: Diagnosis not present

## 2015-10-03 DIAGNOSIS — D631 Anemia in chronic kidney disease: Secondary | ICD-10-CM | POA: Diagnosis not present

## 2015-10-03 DIAGNOSIS — E1129 Type 2 diabetes mellitus with other diabetic kidney complication: Secondary | ICD-10-CM | POA: Diagnosis not present

## 2015-10-03 DIAGNOSIS — N186 End stage renal disease: Secondary | ICD-10-CM | POA: Diagnosis not present

## 2015-10-04 DIAGNOSIS — J9601 Acute respiratory failure with hypoxia: Secondary | ICD-10-CM | POA: Diagnosis not present

## 2015-10-04 DIAGNOSIS — N186 End stage renal disease: Secondary | ICD-10-CM | POA: Diagnosis not present

## 2015-10-04 DIAGNOSIS — E1122 Type 2 diabetes mellitus with diabetic chronic kidney disease: Secondary | ICD-10-CM | POA: Diagnosis not present

## 2015-10-04 DIAGNOSIS — S0181XA Laceration without foreign body of other part of head, initial encounter: Secondary | ICD-10-CM | POA: Insufficient documentation

## 2015-10-04 DIAGNOSIS — E8770 Fluid overload, unspecified: Secondary | ICD-10-CM | POA: Diagnosis not present

## 2015-10-04 DIAGNOSIS — I10 Essential (primary) hypertension: Secondary | ICD-10-CM | POA: Diagnosis not present

## 2015-10-04 DIAGNOSIS — I739 Peripheral vascular disease, unspecified: Secondary | ICD-10-CM | POA: Diagnosis not present

## 2015-10-05 DIAGNOSIS — D509 Iron deficiency anemia, unspecified: Secondary | ICD-10-CM | POA: Diagnosis not present

## 2015-10-05 DIAGNOSIS — D631 Anemia in chronic kidney disease: Secondary | ICD-10-CM | POA: Diagnosis not present

## 2015-10-05 DIAGNOSIS — E1129 Type 2 diabetes mellitus with other diabetic kidney complication: Secondary | ICD-10-CM | POA: Diagnosis not present

## 2015-10-05 DIAGNOSIS — N186 End stage renal disease: Secondary | ICD-10-CM | POA: Diagnosis not present

## 2015-10-05 DIAGNOSIS — D508 Other iron deficiency anemias: Secondary | ICD-10-CM | POA: Diagnosis not present

## 2015-10-05 DIAGNOSIS — N2581 Secondary hyperparathyroidism of renal origin: Secondary | ICD-10-CM | POA: Diagnosis not present

## 2015-10-05 DIAGNOSIS — S0181XA Laceration without foreign body of other part of head, initial encounter: Secondary | ICD-10-CM | POA: Diagnosis not present

## 2015-10-06 DIAGNOSIS — I10 Essential (primary) hypertension: Secondary | ICD-10-CM | POA: Diagnosis not present

## 2015-10-06 DIAGNOSIS — E8770 Fluid overload, unspecified: Secondary | ICD-10-CM | POA: Diagnosis not present

## 2015-10-06 DIAGNOSIS — N186 End stage renal disease: Secondary | ICD-10-CM | POA: Diagnosis not present

## 2015-10-06 DIAGNOSIS — I739 Peripheral vascular disease, unspecified: Secondary | ICD-10-CM | POA: Diagnosis not present

## 2015-10-06 DIAGNOSIS — E1122 Type 2 diabetes mellitus with diabetic chronic kidney disease: Secondary | ICD-10-CM | POA: Diagnosis not present

## 2015-10-06 DIAGNOSIS — J9601 Acute respiratory failure with hypoxia: Secondary | ICD-10-CM | POA: Diagnosis not present

## 2015-10-07 DIAGNOSIS — N2581 Secondary hyperparathyroidism of renal origin: Secondary | ICD-10-CM | POA: Diagnosis not present

## 2015-10-07 DIAGNOSIS — N186 End stage renal disease: Secondary | ICD-10-CM | POA: Diagnosis not present

## 2015-10-07 DIAGNOSIS — E1129 Type 2 diabetes mellitus with other diabetic kidney complication: Secondary | ICD-10-CM | POA: Diagnosis not present

## 2015-10-07 DIAGNOSIS — D508 Other iron deficiency anemias: Secondary | ICD-10-CM | POA: Diagnosis not present

## 2015-10-07 DIAGNOSIS — D631 Anemia in chronic kidney disease: Secondary | ICD-10-CM | POA: Diagnosis not present

## 2015-10-07 DIAGNOSIS — D509 Iron deficiency anemia, unspecified: Secondary | ICD-10-CM | POA: Diagnosis not present

## 2015-10-07 MED FILL — UNIFINE PENTIPS 32GX5/32: 32G X 4 MM | 90 days supply | Qty: 100 | Fill #1

## 2015-10-07 MED FILL — SENSIPAR 60 MG TABLET: 60 | 30 days supply | Qty: 30 | Fill #8

## 2015-10-10 DIAGNOSIS — D508 Other iron deficiency anemias: Secondary | ICD-10-CM | POA: Diagnosis not present

## 2015-10-10 DIAGNOSIS — N186 End stage renal disease: Secondary | ICD-10-CM | POA: Diagnosis not present

## 2015-10-10 DIAGNOSIS — D631 Anemia in chronic kidney disease: Secondary | ICD-10-CM | POA: Diagnosis not present

## 2015-10-10 DIAGNOSIS — D509 Iron deficiency anemia, unspecified: Secondary | ICD-10-CM | POA: Diagnosis not present

## 2015-10-10 DIAGNOSIS — N2581 Secondary hyperparathyroidism of renal origin: Secondary | ICD-10-CM | POA: Diagnosis not present

## 2015-10-10 DIAGNOSIS — E1129 Type 2 diabetes mellitus with other diabetic kidney complication: Secondary | ICD-10-CM | POA: Diagnosis not present

## 2015-10-10 MED FILL — diazePAM 10 MG TABS: 10 | 30 days supply | Qty: 90 | Fill #1

## 2015-10-11 DIAGNOSIS — Z992 Dependence on renal dialysis: Secondary | ICD-10-CM | POA: Diagnosis not present

## 2015-10-11 DIAGNOSIS — L609 Nail disorder, unspecified: Secondary | ICD-10-CM | POA: Diagnosis not present

## 2015-10-11 DIAGNOSIS — E1129 Type 2 diabetes mellitus with other diabetic kidney complication: Secondary | ICD-10-CM | POA: Diagnosis not present

## 2015-10-11 DIAGNOSIS — G609 Hereditary and idiopathic neuropathy, unspecified: Secondary | ICD-10-CM | POA: Diagnosis not present

## 2015-10-11 DIAGNOSIS — R234 Changes in skin texture: Secondary | ICD-10-CM | POA: Diagnosis not present

## 2015-10-11 DIAGNOSIS — E1122 Type 2 diabetes mellitus with diabetic chronic kidney disease: Secondary | ICD-10-CM | POA: Diagnosis not present

## 2015-10-11 DIAGNOSIS — N186 End stage renal disease: Secondary | ICD-10-CM | POA: Diagnosis not present

## 2015-10-12 DIAGNOSIS — N186 End stage renal disease: Secondary | ICD-10-CM | POA: Diagnosis not present

## 2015-10-12 DIAGNOSIS — E1129 Type 2 diabetes mellitus with other diabetic kidney complication: Secondary | ICD-10-CM | POA: Diagnosis not present

## 2015-10-12 DIAGNOSIS — N2581 Secondary hyperparathyroidism of renal origin: Secondary | ICD-10-CM | POA: Diagnosis not present

## 2015-10-12 DIAGNOSIS — D508 Other iron deficiency anemias: Secondary | ICD-10-CM | POA: Diagnosis not present

## 2015-10-12 DIAGNOSIS — D509 Iron deficiency anemia, unspecified: Secondary | ICD-10-CM | POA: Diagnosis not present

## 2015-10-12 DIAGNOSIS — D631 Anemia in chronic kidney disease: Secondary | ICD-10-CM | POA: Diagnosis not present

## 2015-10-13 DIAGNOSIS — N186 End stage renal disease: Secondary | ICD-10-CM | POA: Diagnosis not present

## 2015-10-13 DIAGNOSIS — T82858D Stenosis of vascular prosthetic devices, implants and grafts, subsequent encounter: Secondary | ICD-10-CM | POA: Diagnosis not present

## 2015-10-13 DIAGNOSIS — Z992 Dependence on renal dialysis: Secondary | ICD-10-CM | POA: Diagnosis not present

## 2015-10-13 DIAGNOSIS — I871 Compression of vein: Secondary | ICD-10-CM | POA: Diagnosis not present

## 2015-10-14 DIAGNOSIS — N186 End stage renal disease: Secondary | ICD-10-CM | POA: Diagnosis not present

## 2015-10-14 DIAGNOSIS — D631 Anemia in chronic kidney disease: Secondary | ICD-10-CM | POA: Diagnosis not present

## 2015-10-14 DIAGNOSIS — E1129 Type 2 diabetes mellitus with other diabetic kidney complication: Secondary | ICD-10-CM | POA: Diagnosis not present

## 2015-10-14 DIAGNOSIS — D509 Iron deficiency anemia, unspecified: Secondary | ICD-10-CM | POA: Diagnosis not present

## 2015-10-14 DIAGNOSIS — N2581 Secondary hyperparathyroidism of renal origin: Secondary | ICD-10-CM | POA: Diagnosis not present

## 2015-10-14 DIAGNOSIS — D508 Other iron deficiency anemias: Secondary | ICD-10-CM | POA: Diagnosis not present

## 2015-10-16 DIAGNOSIS — E668 Other obesity: Secondary | ICD-10-CM | POA: Diagnosis not present

## 2015-10-16 DIAGNOSIS — N186 End stage renal disease: Secondary | ICD-10-CM | POA: Diagnosis not present

## 2015-10-17 DIAGNOSIS — N2581 Secondary hyperparathyroidism of renal origin: Secondary | ICD-10-CM | POA: Diagnosis not present

## 2015-10-17 DIAGNOSIS — D509 Iron deficiency anemia, unspecified: Secondary | ICD-10-CM | POA: Diagnosis not present

## 2015-10-17 DIAGNOSIS — D508 Other iron deficiency anemias: Secondary | ICD-10-CM | POA: Diagnosis not present

## 2015-10-17 DIAGNOSIS — D631 Anemia in chronic kidney disease: Secondary | ICD-10-CM | POA: Diagnosis not present

## 2015-10-17 DIAGNOSIS — N186 End stage renal disease: Secondary | ICD-10-CM | POA: Diagnosis not present

## 2015-10-17 DIAGNOSIS — E1129 Type 2 diabetes mellitus with other diabetic kidney complication: Secondary | ICD-10-CM | POA: Diagnosis not present

## 2015-10-18 DIAGNOSIS — E1122 Type 2 diabetes mellitus with diabetic chronic kidney disease: Secondary | ICD-10-CM | POA: Diagnosis not present

## 2015-10-18 DIAGNOSIS — I120 Hypertensive chronic kidney disease with stage 5 chronic kidney disease or end stage renal disease: Secondary | ICD-10-CM | POA: Diagnosis not present

## 2015-10-18 DIAGNOSIS — F329 Major depressive disorder, single episode, unspecified: Secondary | ICD-10-CM | POA: Diagnosis not present

## 2015-10-18 DIAGNOSIS — R0989 Other specified symptoms and signs involving the circulatory and respiratory systems: Secondary | ICD-10-CM | POA: Diagnosis not present

## 2015-10-18 DIAGNOSIS — N186 End stage renal disease: Secondary | ICD-10-CM | POA: Diagnosis not present

## 2015-10-19 DIAGNOSIS — D508 Other iron deficiency anemias: Secondary | ICD-10-CM | POA: Diagnosis not present

## 2015-10-19 DIAGNOSIS — E1129 Type 2 diabetes mellitus with other diabetic kidney complication: Secondary | ICD-10-CM | POA: Diagnosis not present

## 2015-10-19 DIAGNOSIS — N186 End stage renal disease: Secondary | ICD-10-CM | POA: Diagnosis not present

## 2015-10-19 DIAGNOSIS — D509 Iron deficiency anemia, unspecified: Secondary | ICD-10-CM | POA: Diagnosis not present

## 2015-10-19 DIAGNOSIS — N2581 Secondary hyperparathyroidism of renal origin: Secondary | ICD-10-CM | POA: Diagnosis not present

## 2015-10-19 DIAGNOSIS — D631 Anemia in chronic kidney disease: Secondary | ICD-10-CM | POA: Diagnosis not present

## 2015-10-19 MED FILL — GABAPENTIN 300 MG CAPSULE: 300 | 90 days supply | Qty: 270 | Fill #2

## 2015-10-21 DIAGNOSIS — N186 End stage renal disease: Secondary | ICD-10-CM | POA: Diagnosis not present

## 2015-10-21 DIAGNOSIS — D508 Other iron deficiency anemias: Secondary | ICD-10-CM | POA: Diagnosis not present

## 2015-10-21 DIAGNOSIS — E1129 Type 2 diabetes mellitus with other diabetic kidney complication: Secondary | ICD-10-CM | POA: Diagnosis not present

## 2015-10-21 DIAGNOSIS — D631 Anemia in chronic kidney disease: Secondary | ICD-10-CM | POA: Diagnosis not present

## 2015-10-21 DIAGNOSIS — D509 Iron deficiency anemia, unspecified: Secondary | ICD-10-CM | POA: Diagnosis not present

## 2015-10-21 DIAGNOSIS — N2581 Secondary hyperparathyroidism of renal origin: Secondary | ICD-10-CM | POA: Diagnosis not present

## 2015-10-21 MED FILL — TOUJEO SOLOSTAR 300 UNITS/M: 300 | 28 days supply | Qty: 9 | Fill #7

## 2015-10-21 MED FILL — DIALYVITE WITH ZINC TABLET: 90 days supply | Qty: 90 | Fill #0

## 2015-10-24 DIAGNOSIS — D509 Iron deficiency anemia, unspecified: Secondary | ICD-10-CM | POA: Diagnosis not present

## 2015-10-24 DIAGNOSIS — D631 Anemia in chronic kidney disease: Secondary | ICD-10-CM | POA: Diagnosis not present

## 2015-10-24 DIAGNOSIS — E1129 Type 2 diabetes mellitus with other diabetic kidney complication: Secondary | ICD-10-CM | POA: Diagnosis not present

## 2015-10-24 DIAGNOSIS — N186 End stage renal disease: Secondary | ICD-10-CM | POA: Diagnosis not present

## 2015-10-24 DIAGNOSIS — D508 Other iron deficiency anemias: Secondary | ICD-10-CM | POA: Diagnosis not present

## 2015-10-24 DIAGNOSIS — N2581 Secondary hyperparathyroidism of renal origin: Secondary | ICD-10-CM | POA: Diagnosis not present

## 2015-10-26 DIAGNOSIS — N2581 Secondary hyperparathyroidism of renal origin: Secondary | ICD-10-CM | POA: Diagnosis not present

## 2015-10-26 DIAGNOSIS — D631 Anemia in chronic kidney disease: Secondary | ICD-10-CM | POA: Diagnosis not present

## 2015-10-26 DIAGNOSIS — D509 Iron deficiency anemia, unspecified: Secondary | ICD-10-CM | POA: Diagnosis not present

## 2015-10-26 DIAGNOSIS — N186 End stage renal disease: Secondary | ICD-10-CM | POA: Diagnosis not present

## 2015-10-26 DIAGNOSIS — D508 Other iron deficiency anemias: Secondary | ICD-10-CM | POA: Diagnosis not present

## 2015-10-26 DIAGNOSIS — E1129 Type 2 diabetes mellitus with other diabetic kidney complication: Secondary | ICD-10-CM | POA: Diagnosis not present

## 2015-10-28 DIAGNOSIS — D509 Iron deficiency anemia, unspecified: Secondary | ICD-10-CM | POA: Diagnosis not present

## 2015-10-28 DIAGNOSIS — N2581 Secondary hyperparathyroidism of renal origin: Secondary | ICD-10-CM | POA: Diagnosis not present

## 2015-10-28 DIAGNOSIS — E1129 Type 2 diabetes mellitus with other diabetic kidney complication: Secondary | ICD-10-CM | POA: Diagnosis not present

## 2015-10-28 DIAGNOSIS — D631 Anemia in chronic kidney disease: Secondary | ICD-10-CM | POA: Diagnosis not present

## 2015-10-28 DIAGNOSIS — N186 End stage renal disease: Secondary | ICD-10-CM | POA: Diagnosis not present

## 2015-10-28 DIAGNOSIS — D508 Other iron deficiency anemias: Secondary | ICD-10-CM | POA: Diagnosis not present

## 2015-10-31 DIAGNOSIS — D508 Other iron deficiency anemias: Secondary | ICD-10-CM | POA: Diagnosis not present

## 2015-10-31 DIAGNOSIS — D631 Anemia in chronic kidney disease: Secondary | ICD-10-CM | POA: Diagnosis not present

## 2015-10-31 DIAGNOSIS — N186 End stage renal disease: Secondary | ICD-10-CM | POA: Diagnosis not present

## 2015-10-31 DIAGNOSIS — E1129 Type 2 diabetes mellitus with other diabetic kidney complication: Secondary | ICD-10-CM | POA: Diagnosis not present

## 2015-10-31 DIAGNOSIS — N2581 Secondary hyperparathyroidism of renal origin: Secondary | ICD-10-CM | POA: Diagnosis not present

## 2015-10-31 DIAGNOSIS — D509 Iron deficiency anemia, unspecified: Secondary | ICD-10-CM | POA: Diagnosis not present

## 2015-11-01 DIAGNOSIS — R234 Changes in skin texture: Secondary | ICD-10-CM | POA: Diagnosis not present

## 2015-11-01 DIAGNOSIS — E1122 Type 2 diabetes mellitus with diabetic chronic kidney disease: Secondary | ICD-10-CM | POA: Diagnosis not present

## 2015-11-01 DIAGNOSIS — G609 Hereditary and idiopathic neuropathy, unspecified: Secondary | ICD-10-CM | POA: Diagnosis not present

## 2015-11-02 DIAGNOSIS — N2581 Secondary hyperparathyroidism of renal origin: Secondary | ICD-10-CM | POA: Diagnosis not present

## 2015-11-02 DIAGNOSIS — D509 Iron deficiency anemia, unspecified: Secondary | ICD-10-CM | POA: Diagnosis not present

## 2015-11-02 DIAGNOSIS — E1129 Type 2 diabetes mellitus with other diabetic kidney complication: Secondary | ICD-10-CM | POA: Diagnosis not present

## 2015-11-02 DIAGNOSIS — D631 Anemia in chronic kidney disease: Secondary | ICD-10-CM | POA: Diagnosis not present

## 2015-11-02 DIAGNOSIS — N186 End stage renal disease: Secondary | ICD-10-CM | POA: Diagnosis not present

## 2015-11-02 DIAGNOSIS — D508 Other iron deficiency anemias: Secondary | ICD-10-CM | POA: Diagnosis not present

## 2015-11-03 DIAGNOSIS — G894 Chronic pain syndrome: Secondary | ICD-10-CM | POA: Diagnosis not present

## 2015-11-03 DIAGNOSIS — I10 Essential (primary) hypertension: Secondary | ICD-10-CM | POA: Diagnosis not present

## 2015-11-03 DIAGNOSIS — R296 Repeated falls: Secondary | ICD-10-CM | POA: Diagnosis not present

## 2015-11-03 DIAGNOSIS — Z5189 Encounter for other specified aftercare: Secondary | ICD-10-CM | POA: Diagnosis not present

## 2015-11-03 DIAGNOSIS — N186 End stage renal disease: Secondary | ICD-10-CM | POA: Diagnosis not present

## 2015-11-03 DIAGNOSIS — R0989 Other specified symptoms and signs involving the circulatory and respiratory systems: Secondary | ICD-10-CM | POA: Diagnosis not present

## 2015-11-04 DIAGNOSIS — D631 Anemia in chronic kidney disease: Secondary | ICD-10-CM | POA: Diagnosis not present

## 2015-11-04 DIAGNOSIS — D509 Iron deficiency anemia, unspecified: Secondary | ICD-10-CM | POA: Diagnosis not present

## 2015-11-04 DIAGNOSIS — N186 End stage renal disease: Secondary | ICD-10-CM | POA: Diagnosis not present

## 2015-11-04 DIAGNOSIS — E1129 Type 2 diabetes mellitus with other diabetic kidney complication: Secondary | ICD-10-CM | POA: Diagnosis not present

## 2015-11-04 DIAGNOSIS — N2581 Secondary hyperparathyroidism of renal origin: Secondary | ICD-10-CM | POA: Diagnosis not present

## 2015-11-04 DIAGNOSIS — D508 Other iron deficiency anemias: Secondary | ICD-10-CM | POA: Diagnosis not present

## 2015-11-07 DIAGNOSIS — D508 Other iron deficiency anemias: Secondary | ICD-10-CM | POA: Diagnosis not present

## 2015-11-07 DIAGNOSIS — N2581 Secondary hyperparathyroidism of renal origin: Secondary | ICD-10-CM | POA: Diagnosis not present

## 2015-11-07 DIAGNOSIS — D509 Iron deficiency anemia, unspecified: Secondary | ICD-10-CM | POA: Diagnosis not present

## 2015-11-07 DIAGNOSIS — D631 Anemia in chronic kidney disease: Secondary | ICD-10-CM | POA: Diagnosis not present

## 2015-11-07 DIAGNOSIS — N186 End stage renal disease: Secondary | ICD-10-CM | POA: Diagnosis not present

## 2015-11-07 DIAGNOSIS — E1129 Type 2 diabetes mellitus with other diabetic kidney complication: Secondary | ICD-10-CM | POA: Diagnosis not present

## 2015-11-08 MED FILL — diazePAM 10 MG TABS: 10 | 30 days supply | Qty: 90 | Fill #2

## 2015-11-08 MED FILL — SENSIPAR 60 MG TABLET: 60 | 30 days supply | Qty: 30 | Fill #9

## 2015-11-09 DIAGNOSIS — N2581 Secondary hyperparathyroidism of renal origin: Secondary | ICD-10-CM | POA: Diagnosis not present

## 2015-11-09 DIAGNOSIS — N186 End stage renal disease: Secondary | ICD-10-CM | POA: Diagnosis not present

## 2015-11-09 DIAGNOSIS — D508 Other iron deficiency anemias: Secondary | ICD-10-CM | POA: Diagnosis not present

## 2015-11-09 DIAGNOSIS — E1129 Type 2 diabetes mellitus with other diabetic kidney complication: Secondary | ICD-10-CM | POA: Diagnosis not present

## 2015-11-09 DIAGNOSIS — D631 Anemia in chronic kidney disease: Secondary | ICD-10-CM | POA: Diagnosis not present

## 2015-11-09 DIAGNOSIS — D509 Iron deficiency anemia, unspecified: Secondary | ICD-10-CM | POA: Diagnosis not present

## 2015-11-09 MED FILL — ATORVASTATIN 80 MG TABLET: 80 | 90 days supply | Qty: 90 | Fill #3

## 2015-11-09 MED FILL — DULoxetine HCL 60 MG CPEP: 60 | 90 days supply | Qty: 90 | Fill #3

## 2015-11-11 DIAGNOSIS — D631 Anemia in chronic kidney disease: Secondary | ICD-10-CM | POA: Diagnosis not present

## 2015-11-11 DIAGNOSIS — D508 Other iron deficiency anemias: Secondary | ICD-10-CM | POA: Diagnosis not present

## 2015-11-11 DIAGNOSIS — N2581 Secondary hyperparathyroidism of renal origin: Secondary | ICD-10-CM | POA: Diagnosis not present

## 2015-11-11 DIAGNOSIS — E1129 Type 2 diabetes mellitus with other diabetic kidney complication: Secondary | ICD-10-CM | POA: Diagnosis not present

## 2015-11-11 DIAGNOSIS — N186 End stage renal disease: Secondary | ICD-10-CM | POA: Diagnosis not present

## 2015-11-11 DIAGNOSIS — Z992 Dependence on renal dialysis: Secondary | ICD-10-CM | POA: Diagnosis not present

## 2015-11-11 DIAGNOSIS — D509 Iron deficiency anemia, unspecified: Secondary | ICD-10-CM | POA: Diagnosis not present

## 2015-11-14 DIAGNOSIS — D509 Iron deficiency anemia, unspecified: Secondary | ICD-10-CM | POA: Diagnosis not present

## 2015-11-14 DIAGNOSIS — N2581 Secondary hyperparathyroidism of renal origin: Secondary | ICD-10-CM | POA: Diagnosis not present

## 2015-11-14 DIAGNOSIS — D631 Anemia in chronic kidney disease: Secondary | ICD-10-CM | POA: Diagnosis not present

## 2015-11-14 DIAGNOSIS — E1129 Type 2 diabetes mellitus with other diabetic kidney complication: Secondary | ICD-10-CM | POA: Diagnosis not present

## 2015-11-14 DIAGNOSIS — N186 End stage renal disease: Secondary | ICD-10-CM | POA: Diagnosis not present

## 2015-11-15 DIAGNOSIS — R234 Changes in skin texture: Secondary | ICD-10-CM | POA: Diagnosis not present

## 2015-11-15 DIAGNOSIS — G609 Hereditary and idiopathic neuropathy, unspecified: Secondary | ICD-10-CM | POA: Diagnosis not present

## 2015-11-15 DIAGNOSIS — E1122 Type 2 diabetes mellitus with diabetic chronic kidney disease: Secondary | ICD-10-CM | POA: Diagnosis not present

## 2015-11-16 DIAGNOSIS — D509 Iron deficiency anemia, unspecified: Secondary | ICD-10-CM | POA: Diagnosis not present

## 2015-11-16 DIAGNOSIS — N186 End stage renal disease: Secondary | ICD-10-CM | POA: Diagnosis not present

## 2015-11-16 DIAGNOSIS — E1129 Type 2 diabetes mellitus with other diabetic kidney complication: Secondary | ICD-10-CM | POA: Diagnosis not present

## 2015-11-16 DIAGNOSIS — E668 Other obesity: Secondary | ICD-10-CM | POA: Diagnosis not present

## 2015-11-16 DIAGNOSIS — N2581 Secondary hyperparathyroidism of renal origin: Secondary | ICD-10-CM | POA: Diagnosis not present

## 2015-11-16 DIAGNOSIS — D631 Anemia in chronic kidney disease: Secondary | ICD-10-CM | POA: Diagnosis not present

## 2015-11-18 DIAGNOSIS — N186 End stage renal disease: Secondary | ICD-10-CM | POA: Diagnosis not present

## 2015-11-18 DIAGNOSIS — D509 Iron deficiency anemia, unspecified: Secondary | ICD-10-CM | POA: Diagnosis not present

## 2015-11-18 DIAGNOSIS — D631 Anemia in chronic kidney disease: Secondary | ICD-10-CM | POA: Diagnosis not present

## 2015-11-18 DIAGNOSIS — N2581 Secondary hyperparathyroidism of renal origin: Secondary | ICD-10-CM | POA: Diagnosis not present

## 2015-11-18 DIAGNOSIS — E1129 Type 2 diabetes mellitus with other diabetic kidney complication: Secondary | ICD-10-CM | POA: Diagnosis not present

## 2015-11-21 DIAGNOSIS — D509 Iron deficiency anemia, unspecified: Secondary | ICD-10-CM | POA: Diagnosis not present

## 2015-11-21 DIAGNOSIS — E1129 Type 2 diabetes mellitus with other diabetic kidney complication: Secondary | ICD-10-CM | POA: Diagnosis not present

## 2015-11-21 DIAGNOSIS — N2581 Secondary hyperparathyroidism of renal origin: Secondary | ICD-10-CM | POA: Diagnosis not present

## 2015-11-21 DIAGNOSIS — N186 End stage renal disease: Secondary | ICD-10-CM | POA: Diagnosis not present

## 2015-11-21 DIAGNOSIS — D631 Anemia in chronic kidney disease: Secondary | ICD-10-CM | POA: Diagnosis not present

## 2015-11-22 MED FILL — TOUJEO SOLOSTAR 300 UNITS/M: 300 | 28 days supply | Qty: 9 | Fill #8

## 2015-11-23 DIAGNOSIS — D631 Anemia in chronic kidney disease: Secondary | ICD-10-CM | POA: Diagnosis not present

## 2015-11-23 DIAGNOSIS — E1129 Type 2 diabetes mellitus with other diabetic kidney complication: Secondary | ICD-10-CM | POA: Diagnosis not present

## 2015-11-23 DIAGNOSIS — D509 Iron deficiency anemia, unspecified: Secondary | ICD-10-CM | POA: Diagnosis not present

## 2015-11-23 DIAGNOSIS — N186 End stage renal disease: Secondary | ICD-10-CM | POA: Diagnosis not present

## 2015-11-23 DIAGNOSIS — N2581 Secondary hyperparathyroidism of renal origin: Secondary | ICD-10-CM | POA: Diagnosis not present

## 2015-11-25 DIAGNOSIS — E1129 Type 2 diabetes mellitus with other diabetic kidney complication: Secondary | ICD-10-CM | POA: Diagnosis not present

## 2015-11-25 DIAGNOSIS — N186 End stage renal disease: Secondary | ICD-10-CM | POA: Diagnosis not present

## 2015-11-25 DIAGNOSIS — N2581 Secondary hyperparathyroidism of renal origin: Secondary | ICD-10-CM | POA: Diagnosis not present

## 2015-11-25 DIAGNOSIS — D631 Anemia in chronic kidney disease: Secondary | ICD-10-CM | POA: Diagnosis not present

## 2015-11-25 DIAGNOSIS — D509 Iron deficiency anemia, unspecified: Secondary | ICD-10-CM | POA: Diagnosis not present

## 2015-11-28 DIAGNOSIS — D631 Anemia in chronic kidney disease: Secondary | ICD-10-CM | POA: Diagnosis not present

## 2015-11-28 DIAGNOSIS — E1129 Type 2 diabetes mellitus with other diabetic kidney complication: Secondary | ICD-10-CM | POA: Diagnosis not present

## 2015-11-28 DIAGNOSIS — N186 End stage renal disease: Secondary | ICD-10-CM | POA: Diagnosis not present

## 2015-11-28 DIAGNOSIS — D509 Iron deficiency anemia, unspecified: Secondary | ICD-10-CM | POA: Diagnosis not present

## 2015-11-28 DIAGNOSIS — N2581 Secondary hyperparathyroidism of renal origin: Secondary | ICD-10-CM | POA: Diagnosis not present

## 2015-11-29 DIAGNOSIS — R234 Changes in skin texture: Secondary | ICD-10-CM | POA: Diagnosis not present

## 2015-11-29 DIAGNOSIS — G609 Hereditary and idiopathic neuropathy, unspecified: Secondary | ICD-10-CM | POA: Diagnosis not present

## 2015-11-29 DIAGNOSIS — E1122 Type 2 diabetes mellitus with diabetic chronic kidney disease: Secondary | ICD-10-CM | POA: Diagnosis not present

## 2015-11-30 DIAGNOSIS — N186 End stage renal disease: Secondary | ICD-10-CM | POA: Diagnosis not present

## 2015-11-30 DIAGNOSIS — D631 Anemia in chronic kidney disease: Secondary | ICD-10-CM | POA: Diagnosis not present

## 2015-11-30 DIAGNOSIS — E1129 Type 2 diabetes mellitus with other diabetic kidney complication: Secondary | ICD-10-CM | POA: Diagnosis not present

## 2015-11-30 DIAGNOSIS — N2581 Secondary hyperparathyroidism of renal origin: Secondary | ICD-10-CM | POA: Diagnosis not present

## 2015-11-30 DIAGNOSIS — D509 Iron deficiency anemia, unspecified: Secondary | ICD-10-CM | POA: Diagnosis not present

## 2015-12-02 ENCOUNTER — Emergency Department (HOSPITAL_COMMUNITY): Payer: Medicare Other

## 2015-12-02 ENCOUNTER — Encounter (HOSPITAL_COMMUNITY): Payer: Self-pay | Admitting: Emergency Medicine

## 2015-12-02 ENCOUNTER — Emergency Department (HOSPITAL_COMMUNITY)
Admission: EM | Admit: 2015-12-02 | Discharge: 2015-12-02 | Disposition: A | Discharge status: Home / Self Care | Payer: Medicare Other | Attending: Emergency Medicine | Admitting: Emergency Medicine

## 2015-12-02 DIAGNOSIS — I120 Hypertensive chronic kidney disease with stage 5 chronic kidney disease or end stage renal disease: Secondary | ICD-10-CM | POA: Insufficient documentation

## 2015-12-02 DIAGNOSIS — E1122 Type 2 diabetes mellitus with diabetic chronic kidney disease: Secondary | ICD-10-CM | POA: Insufficient documentation

## 2015-12-02 DIAGNOSIS — S3991XA Unspecified injury of abdomen, initial encounter: Secondary | ICD-10-CM | POA: Diagnosis not present

## 2015-12-02 DIAGNOSIS — S199XXA Unspecified injury of neck, initial encounter: Secondary | ICD-10-CM | POA: Diagnosis not present

## 2015-12-02 DIAGNOSIS — R1110 Vomiting, unspecified: Secondary | ICD-10-CM | POA: Diagnosis present

## 2015-12-02 DIAGNOSIS — F329 Major depressive disorder, single episode, unspecified: Secondary | ICD-10-CM | POA: Diagnosis not present

## 2015-12-02 DIAGNOSIS — Z87891 Personal history of nicotine dependence: Secondary | ICD-10-CM | POA: Diagnosis not present

## 2015-12-02 DIAGNOSIS — D509 Iron deficiency anemia, unspecified: Secondary | ICD-10-CM | POA: Diagnosis not present

## 2015-12-02 DIAGNOSIS — Z794 Long term (current) use of insulin: Secondary | ICD-10-CM | POA: Insufficient documentation

## 2015-12-02 DIAGNOSIS — I714 Abdominal aortic aneurysm, without rupture, unspecified: Secondary | ICD-10-CM

## 2015-12-02 DIAGNOSIS — J189 Pneumonia, unspecified organism: Secondary | ICD-10-CM | POA: Diagnosis not present

## 2015-12-02 DIAGNOSIS — W19XXXA Unspecified fall, initial encounter: Secondary | ICD-10-CM

## 2015-12-02 DIAGNOSIS — Z79899 Other long term (current) drug therapy: Secondary | ICD-10-CM | POA: Diagnosis not present

## 2015-12-02 DIAGNOSIS — M1990 Unspecified osteoarthritis, unspecified site: Secondary | ICD-10-CM | POA: Insufficient documentation

## 2015-12-02 DIAGNOSIS — J449 Chronic obstructive pulmonary disease, unspecified: Secondary | ICD-10-CM | POA: Insufficient documentation

## 2015-12-02 DIAGNOSIS — E1129 Type 2 diabetes mellitus with other diabetic kidney complication: Secondary | ICD-10-CM | POA: Diagnosis not present

## 2015-12-02 DIAGNOSIS — N186 End stage renal disease: Secondary | ICD-10-CM | POA: Diagnosis not present

## 2015-12-02 DIAGNOSIS — S0990XA Unspecified injury of head, initial encounter: Secondary | ICD-10-CM | POA: Diagnosis not present

## 2015-12-02 DIAGNOSIS — N2581 Secondary hyperparathyroidism of renal origin: Secondary | ICD-10-CM | POA: Diagnosis not present

## 2015-12-02 DIAGNOSIS — Z992 Dependence on renal dialysis: Secondary | ICD-10-CM | POA: Diagnosis not present

## 2015-12-02 DIAGNOSIS — S298XXA Other specified injuries of thorax, initial encounter: Secondary | ICD-10-CM | POA: Diagnosis not present

## 2015-12-02 DIAGNOSIS — R112 Nausea with vomiting, unspecified: Secondary | ICD-10-CM | POA: Diagnosis not present

## 2015-12-02 DIAGNOSIS — D631 Anemia in chronic kidney disease: Secondary | ICD-10-CM | POA: Diagnosis not present

## 2015-12-02 LAB — COMPREHENSIVE METABOLIC PANEL
ALBUMIN: 3.9 g/dL (ref 3.5–5.0)
ALK PHOS: 68 U/L (ref 38–126)
ALT: 22 U/L (ref 17–63)
AST: 19 U/L (ref 15–41)
Anion gap: 17 — ABNORMAL HIGH (ref 5–15)
BILIRUBIN TOTAL: 0.6 mg/dL (ref 0.3–1.2)
BUN: 50 mg/dL — AB (ref 6–20)
CALCIUM: 8.9 mg/dL (ref 8.9–10.3)
CO2: 25 mmol/L (ref 22–32)
CREATININE: 7.42 mg/dL — AB (ref 0.61–1.24)
Chloride: 97 mmol/L — ABNORMAL LOW (ref 101–111)
GFR calc Af Amer: 8 mL/min — ABNORMAL LOW (ref >60)
GFR, EST NON AFRICAN AMERICAN: 7 mL/min — AB (ref >60)
GLUCOSE: 154 mg/dL — AB (ref 65–99)
POTASSIUM: 3.8 mmol/L (ref 3.5–5.1)
Sodium: 139 mmol/L (ref 135–145)
TOTAL PROTEIN: 8 g/dL (ref 6.5–8.1)

## 2015-12-02 LAB — CBC WITH DIFFERENTIAL/PLATELET
BASOS ABS: 0 10*3/uL (ref 0.0–0.1)
BASOS PCT: 0 %
EOS ABS: 0.2 10*3/uL (ref 0.0–0.7)
EOS PCT: 1 %
HCT: 34.5 % — ABNORMAL LOW (ref 39.0–52.0)
Hemoglobin: 11.4 g/dL — ABNORMAL LOW (ref 13.0–17.0)
Lymphocytes Relative: 3 %
Lymphs Abs: 0.4 10*3/uL — ABNORMAL LOW (ref 0.7–4.0)
MCH: 33.1 pg (ref 26.0–34.0)
MCHC: 33 g/dL (ref 30.0–36.0)
MCV: 100.3 fL — AB (ref 78.0–100.0)
MONO ABS: 1.4 10*3/uL — AB (ref 0.1–1.0)
Monocytes Relative: 11 %
Neutro Abs: 10.6 10*3/uL — ABNORMAL HIGH (ref 1.7–7.7)
Neutrophils Relative %: 85 %
PLATELETS: 326 10*3/uL (ref 150–400)
RBC: 3.44 MIL/uL — AB (ref 4.22–5.81)
RDW: 16.4 % — AB (ref 11.5–15.5)
WBC: 12.6 10*3/uL — AB (ref 4.0–10.5)

## 2015-12-02 MED ORDER — LEVOFLOXACIN 500 MG PO TABS: 500.0000 mg | ORAL_TABLET | Freq: Every day | ORAL | Status: DC

## 2015-12-02 MED ORDER — ONDANSETRON HCL 4 MG/2ML IJ SOLN
4.0000 mg | Freq: Once | INTRAMUSCULAR | Status: AC
  Administered 2015-12-02: 4 mg via INTRAVENOUS
  Filled 2015-12-02: qty 2

## 2015-12-02 MED ORDER — METOCLOPRAMIDE HCL 5 MG/ML IJ SOLN
10.0000 mg | Freq: Once | INTRAMUSCULAR | Status: AC
  Administered 2015-12-02: 10 mg via INTRAVENOUS
  Filled 2015-12-02: qty 2

## 2015-12-02 MED ORDER — LEVOFLOXACIN 250 MG PO TABS
250.0000 mg | ORAL_TABLET | Freq: Once | ORAL | Status: AC
  Administered 2015-12-02: 250 mg via ORAL
  Filled 2015-12-02: qty 1

## 2015-12-02 MED ORDER — SODIUM CHLORIDE 0.9 % IV BOLUS (SEPSIS)
500.0000 mL | Freq: Once | INTRAVENOUS | Status: AC
Start: 2015-12-02 — End: 2015-12-02
  Administered 2015-12-02: 500 mL via INTRAVENOUS

## 2015-12-02 NOTE — ED Notes (Signed)
Patient with noted wound to left second toe. Laceration to left second toe of left foot at 1st joint. Cleansed and dressed with sterile dressing.

## 2015-12-02 NOTE — ED Notes (Signed)
Patient with 3 episodes of vomiting since 1230 today. MWF dialysis. Completed dialysis today. States pain "all over" from falling at home more frequently.

## 2015-12-02 NOTE — Discharge Instructions (Signed)
No walking down the stairs.  You have a 3.2 cm aneurysm in your lower aorta. You will need a follow-up ultrasound in 3 years. Additionally, you have nodular infiltrates in both lungs suggesting a pneumonia. It is recommended you get a repeat scan in 3-6 months to reevaluate this issue. Prescription for antibiotic given. Return if worsen anyway.

## 2015-12-02 NOTE — ED Notes (Signed)
Patient's daughter states patient fell down approximately 14 steps yesterday morning. Patient states lower back pain. MD notified of new information provided to staff.

## 2015-12-02 NOTE — ED Provider Notes (Signed)
CSN: [id number]     Arrival date & time 12/02/15  1327 History   First MD Initiated Contact with Patient 12/02/15 1336     Chief Complaint  Patient presents with  . Emesis     (Consider location/radiation/quality/duration/timing/severity/associated sxs/prior Treatment) HPI.... Level V caveat for urgent need for intervention. Patient complains of 3 episodes of vomiting since noon today. He is on dialysis Monday Wednesday and Friday. He did get his dialysis today. Additionally he fell down 12 steps at home yesterday. No loss of consciousness or neurological deficits. He has trouble with falling secondary to peripheral neuropathy. Review of systems positive for cough and small laceration to left foot  Past Medical History  Diagnosis Date  . Anemia   . Chronic kidney disease   . Anxiety   . Depression   . COPD (chronic obstructive pulmonary disease) ([place])     Pt denies  . Pneumonia     Hx: of  . GERD (gastroesophageal reflux disease)   . H/O hiatal hernia   . Headache(784.0)     Hx: Migraines  . Arthritis   . Numbness of toes     toes and feet  . Diabetes mellitus without complication ([place])   . Hypertension   . Renal insufficiency   . Shortness of breath dyspnea    Past Surgical History  Procedure Laterality Date  . Inguinal hernia repair      ,  times   2  . Tonsillectomy    . Av fistula placement  2012       left arm   . Colonoscopy  10/26/2011    Procedure: COLONOSCOPY;  Surgeon: Rogene Houston, MD;  [facility];  Service: Endoscopy;  Laterality: N/A;  730  . Knee arthroscopy  2011    Right Knee  . Thrombectomy w/ embolectomy Left 12/11/2012    Procedure: THROMBECTOMY ARTERIOVENOUS FISTULA;  Surgeon: Serafina Mitchell, MD;  Location: [facility];  Service: Vascular;  Laterality: Left;  . Av fistula placement Left 12/18/2012    Procedure: ARTERIOVENOUS (AV) FISTULA CREATION;  Surgeon: Angelia Mould, MD;  Location: [facility];  Service: Vascular;  Laterality: Left;  .  Insertion of dialysis catheter Left 12/18/2012    Procedure: INSERTION OF DIALYSIS CATHETER;  Surgeon: Angelia Mould, MD;  Location[facility] OR;  Service: Vascular;  Laterality: Left;  . Esophagogastroduodenoscopy (egd) with esophageal dilation N/A 04/23/2013    Procedure: ESOPHAGOGASTRODUODENOSCOPY (EGD) WITH ESOPHAGEAL DILATION;  Surgeon: Rogene Houston, MD;  [facility];  Service: Endoscopy;  Laterality: N/A;  200-moved to 930   . Fistula superficialization Left 06/18/2013    Procedure: FISTULA SUPERFICIALIZATION & LIGATION BRANCH X 1;  Surgeon: Mal Misty, MD;  Location: [facility];  Service: Vascular;  Laterality: Left;  . Shuntogram N/A 12/09/2012    Procedure: fistulogram;  Surgeon: Serafina Mitchell, MD;  Location: [facility] CATH LAB;  Service: Cardiovascular;  Laterality: N/A;  . Embolectomy Left 12/09/2012    Procedure: EMBOLECTOMY;  Surgeon: Serafina Mitchell, MD;  Location: [facility] CATH LAB;  Service: Cardiovascular;  Laterality: Left;  left arm venous embolization  . Shuntogram Left 06/03/2013    Procedure: Fistulogram;  Surgeon: Serafina Mitchell, MD;  Location: [facility] CATH LAB;  Service: Cardiovascular;  Laterality: Left;   Family History  Problem Relation Age of Onset  . Heart disease Father     Heart Disease before age 25   Social History  Substance Use Topics  . Smoking status:  Former Smoker -- 10 years    Types: Cigars    Quit date: 03/13/2012  . Smokeless tobacco: Never Used  . Alcohol Use: No    Review of Systems  Reason unable to perform ROS: Urgent need for intervention.      Allergies  Morphine and related and Zaroxolyn  Home Medications   Prior to Admission medications   Medication Sig Start Date End Date Taking? Authorizing Provider  atorvastatin (LIPITOR) 80 MG tablet Take 80 mg by mouth at bedtime. 08/10/15  Yes Historical Provider, MD  B Complex-C-Zn-Folic Acid (DIALYVITE/ZINC PO) Take 1 tablet by mouth at bedtime.    Yes Historical Provider, MD   cyclobenzaprine (FLEXERIL) 10 MG tablet Take 10 mg by mouth 3 (three) times daily as needed for muscle spasms.   Yes Historical Provider, MD  diazepam (VALIUM) 10 MG tablet Take 10 mg by mouth 3 (three) times daily.   Yes Historical Provider, MD  diphenhydrAMINE (BENADRYL) 25 mg capsule Take 25 mg by mouth every 6 (six) hours as needed for itching.   Yes Historical Provider, MD  DULoxetine (CYMBALTA) 60 MG capsule Take 60 mg by mouth at bedtime. 08/10/15  Yes Historical Provider, MD  FLUoxetine (PROZAC) 40 MG capsule Take 40 mg by mouth at bedtime. 07/11/15  Yes Historical Provider, MD  gabapentin (NEURONTIN) 300 MG capsule Take 900 mg by mouth at bedtime.  07/05/15  Yes Historical Provider, MD  Insulin Glargine (TOUJEO SOLOSTAR) 300 UNIT/ML SOPN Inject 100 Units into the skin at bedtime.    Yes Historical Provider, MD  lanthanum (FOSRENOL) 1000 MG chewable tablet Chew 1,000 mg by mouth 3 (three) times daily with meals.    Yes Historical Provider, MD  levothyroxine (SYNTHROID, LEVOTHROID) 125 MCG tablet Take 125 mcg by mouth at bedtime. 08/10/15  Yes Historical Provider, MD  Melatonin 10 MG TABS Take 10 mg by mouth at bedtime.   Yes Historical Provider, MD  polyethylene glycol (MIRALAX / GLYCOLAX) packet Take 17 g by mouth daily as needed (constipation).    Yes Historical Provider, MD  polyvinyl alcohol (LIQUIFILM TEARS) 1.4 % ophthalmic solution Place 1-2 drops into both eyes as needed (for dry eyes).   Yes Historical Provider, MD  Probiotic Product ([place]) Take 1 capsule by mouth daily as needed (constipation).   Yes Historical Provider, MD  ranitidine (ZANTAC) 150 MG tablet Take 150 mg by mouth daily as needed for heartburn.   Yes Historical Provider, MD  RENVELA 800 MG tablet Take 3,200 mg by mouth 3 (three) times daily with meals. Take 3200 mg also along with snacks 07/25/15  Yes Historical Provider, MD  SENSIPAR 60 MG tablet Take 60 mg by mouth at bedtime. Reported on  08/29/2015 08/10/15  Yes Historical Provider, MD  silver sulfADIAZINE (SILVADENE) 1 % cream Apply 1 application topically daily.   Yes Historical Provider, MD  temazepam (RESTORIL) 30 MG capsule Take 30 mg by mouth at bedtime.  06/11/14  Yes Historical Provider, MD  levofloxacin (LEVAQUIN) 500 MG tablet Take 1 tablet (500 mg total) by mouth daily. 12/02/15   Nat Christen, MD   BP 101/62 mmHg  Pulse 97  Temp(Src) 97.7 F (36.5 C) (Oral)  Resp 18  Ht '6\' 4"'$  (1.93 m)  Wt 260 lb (117.935 kg)  BMI 31.66 kg/m2  SpO2 96% Physical Exam  Constitutional: He is oriented to person, place, and time.  Alert, coughing but not dyspneic.  HENT:  Head: Normocephalic.  Tender occipital area  Eyes:  Conjunctivae and EOM are normal. Pupils are equal, round, and reactive to light.  Neck: Normal range of motion. Neck supple.  Tender post neck  Cardiovascular: Normal rate and regular rhythm.   Pulmonary/Chest: Effort normal and breath sounds normal.  Abdominal: Soft. Bowel sounds are normal.  Ecchymosis right lat abd  Musculoskeletal: Normal range of motion.  Neurological: He is alert and oriented to person, place, and time.  Skin:  Left second toe;  0.75 cm horizontal lac plantar aspect DIP joint  Psychiatric: He has a normal mood and affect. His behavior is normal.  Nursing note and vitals reviewed.   ED Course  Procedures (including critical care time) Labs Review Labs Reviewed  COMPREHENSIVE METABOLIC PANEL - Abnormal; Notable for the following:    Chloride 97 (*)    Glucose, Bld 154 (*)    BUN 50 (*)    Creatinine, Ser 7.42 (*)    GFR calc non Af Amer 7 (*)    GFR calc Af Amer 8 (*)    Anion gap 17 (*)    All other components within normal limits  CBC WITH DIFFERENTIAL/PLATELET - Abnormal; Notable for the following:    WBC 12.6 (*)    RBC 3.44 (*)    Hemoglobin 11.4 (*)    HCT 34.5 (*)    MCV 100.3 (*)    RDW 16.4 (*)    Neutro Abs 10.6 (*)    Lymphs Abs 0.4 (*)    Monocytes Absolute  1.4 (*)    All other components within normal limits    Imaging Review Ct Abdomen Pelvis Wo Contrast  12/02/2015  CLINICAL DATA:  Patient with 3 episodes of vomiting since 1230 today. MWF dialysis. Completed dialysis today. States pain "all over" from falling at home more frequently. Family states pt fell down 14 steps yesterday. Low back pain. / Hx of ESR. EXAM: CT CHEST, ABDOMEN AND PELVIS WITHOUT CONTRAST TECHNIQUE: Multidetector CT imaging of the chest, abdomen and pelvis was performed following the standard protocol without IV contrast. COMPARISON:  Chest x-ray 08/29/2015 FINDINGS: CT CHEST FINDINGS Heart: Extensive coronary artery calcification is present. The heart is mildly enlarged. No pericardial effusion. Vascular structures: There is moderate atherosclerosis of the thoracic aorta. Bovine arch anatomy. No aneurysm. Mediastinum/thyroid: The visualized portion of the thyroid gland has a normal appearance. Small, nonspecific lymph nodes are identified in the mediastinum. Mildly thickened lower esophageal wall. No mediastinal hematoma. Lungs/Airways: There is patchy nodular infiltrate in the superior segments of the lower lobes bilaterally, left greater than right. No pleural effusions. The airways are grossly patent. Chest wall/osseous: No acute, displaced fractures are identified. Sternum is intact. CT ABDOMEN AND PELVIS FINDINGS Upper abdomen: No focal abnormality identified within the liver, spleen, pancreas, or adrenal glands. The gallbladder is distended. No calcified stones or pericholecystic fluid identified. The kidneys are atrophic. No hydronephrosis. No intrarenal or ureteral calculi identified. Gastrointestinal tract: The stomach and small bowel loops are normal in appearance. The appendix is well seen and has a normal appearance. Numerous colonic diverticula are present. No associated inflammation. Radiopaque material is identified throughout the gastrointestinal tract. Pelvis: Urinary  bladder, seminal vesicles, and prostate gland are normal in appearance. Prostatic calcifications are present. No free pelvic fluid. Retroperitoneum: There is focal aneurysmal dilatation of the infrarenal abdominal aorta just above the bifurcation, measuring 3.2 cm. No retroperitoneal or mesenteric adenopathy. Abdominal wall: Unremarkable. Osseous structures: Degenerative changes are seen in the thoracic and lumbar spine. IMPRESSION: 1. No evidence for acute  injury of the chest, abdomen, or pelvis. 2. Extensive coronary artery disease. 3. Moderate atherosclerosis of the thoracic and abdominal aorta. 4. Bovine arch anatomy. 5. Infrarenal abdominal aortic aneurysm, 3.2 cm. Recommend followup by ultrasound in 3 years. This recommendation follows ACR consensus guidelines: White Paper of the ACR Incidental Findings Committee II on Vascular Findings. J Am Coll Radiol 2013; 18:841-660 6. Atrophic kidneys. 7. Bilateral nodular infiltrates in the lungs, favoring infectious infiltrate. Follow-up in 3-6 months is recommended to document resolution. 8. Diverticulosis of the colon not associated with inflammation. Electronically Signed   By: Nolon Nations M.D.   On: 12/02/2015 16:59   Ct Head Wo Contrast  12/02/2015  CLINICAL DATA:  Three episodes of vomiting since 1230 hours today, end-stage renal disease on dialysis Monday Wednesday Friday, completed dialysis today, fell down 14 steps yesterday, diabetes mellitus, COPD, hypertension, former smoker, initial encounter EXAM: CT HEAD WITHOUT CONTRAST CT CERVICAL SPINE WITHOUT CONTRAST TECHNIQUE: Multidetector CT imaging of the head and cervical spine was performed following the standard protocol without intravenous contrast. Multiplanar CT image reconstructions of the cervical spine were also generated. COMPARISON:  CT head and cervical spine 09/27/2015 FINDINGS: CT HEAD FINDINGS Cavum septum pellucidum. Otherwise normal ventricular morphology. Generalized atrophy. No midline  shift or mass effect. Otherwise normal appearance of brain parenchyma. No intracranial hemorrhage, mass lesion, evidence acute infarction or extra-axial fluid collection. Visualized paranasal sinuses and mastoid air cells clear. No acute osseous findings. CT CERVICAL SPINE FINDINGS Lung apices clear. Prevertebral soft tissues normal thickness. Vertebral body and disc space heights maintained. Minimal anterior spurring C5-C6 and C6-C7 with mild posterior spurring C3-C4. No fracture, subluxation or bone destruction. Multilevel degenerative changes. Visualized skullbase intact. Scattered atherosclerotic calcifications of the carotid systems. IMPRESSION: No acute intracranial abnormalities. Minimal degenerative disc and facet disease changes cervical spine. No acute cervical spine abnormalities. Electronically Signed   By: Lavonia Dana M.D.   On: 12/02/2015 16:45   Ct Chest Wo Contrast  12/02/2015  CLINICAL DATA:  Patient with 3 episodes of vomiting since 1230 today. MWF dialysis. Completed dialysis today. States pain "all over" from falling at home more frequently. Family states pt fell down 14 steps yesterday. Low back pain. / Hx of ESR. EXAM: CT CHEST, ABDOMEN AND PELVIS WITHOUT CONTRAST TECHNIQUE: Multidetector CT imaging of the chest, abdomen and pelvis was performed following the standard protocol without IV contrast. COMPARISON:  Chest x-ray 08/29/2015 FINDINGS: CT CHEST FINDINGS Heart: Extensive coronary artery calcification is present. The heart is mildly enlarged. No pericardial effusion. Vascular structures: There is moderate atherosclerosis of the thoracic aorta. Bovine arch anatomy. No aneurysm. Mediastinum/thyroid: The visualized portion of the thyroid gland has a normal appearance. Small, nonspecific lymph nodes are identified in the mediastinum. Mildly thickened lower esophageal wall. No mediastinal hematoma. Lungs/Airways: There is patchy nodular infiltrate in the superior segments of the lower lobes  bilaterally, left greater than right. No pleural effusions. The airways are grossly patent. Chest wall/osseous: No acute, displaced fractures are identified. Sternum is intact. CT ABDOMEN AND PELVIS FINDINGS Upper abdomen: No focal abnormality identified within the liver, spleen, pancreas, or adrenal glands. The gallbladder is distended. No calcified stones or pericholecystic fluid identified. The kidneys are atrophic. No hydronephrosis. No intrarenal or ureteral calculi identified. Gastrointestinal tract: The stomach and small bowel loops are normal in appearance. The appendix is well seen and has a normal appearance. Numerous colonic diverticula are present. No associated inflammation. Radiopaque material is identified throughout the gastrointestinal tract. Pelvis: Urinary bladder,  seminal vesicles, and prostate gland are normal in appearance. Prostatic calcifications are present. No free pelvic fluid. Retroperitoneum: There is focal aneurysmal dilatation of the infrarenal abdominal aorta just above the bifurcation, measuring 3.2 cm. No retroperitoneal or mesenteric adenopathy. Abdominal wall: Unremarkable. Osseous structures: Degenerative changes are seen in the thoracic and lumbar spine. IMPRESSION: 1. No evidence for acute injury of the chest, abdomen, or pelvis. 2. Extensive coronary artery disease. 3. Moderate atherosclerosis of the thoracic and abdominal aorta. 4. Bovine arch anatomy. 5. Infrarenal abdominal aortic aneurysm, 3.2 cm. Recommend followup by ultrasound in 3 years. This recommendation follows ACR consensus guidelines: White Paper of the ACR Incidental Findings Committee II on Vascular Findings. J Am Coll Radiol 2013; 03:474-259 6. Atrophic kidneys. 7. Bilateral nodular infiltrates in the lungs, favoring infectious infiltrate. Follow-up in 3-6 months is recommended to document resolution. 8. Diverticulosis of the colon not associated with inflammation. Electronically Signed   By: Nolon Nations  M.D.   On: 12/02/2015 16:59   Ct Cervical Spine Wo Contrast  12/02/2015  CLINICAL DATA:  Three episodes of vomiting since 1230 hours today, end-stage renal disease on dialysis Monday Wednesday Friday, completed dialysis today, fell down 14 steps yesterday, diabetes mellitus, COPD, hypertension, former smoker, initial encounter EXAM: CT HEAD WITHOUT CONTRAST CT CERVICAL SPINE WITHOUT CONTRAST TECHNIQUE: Multidetector CT imaging of the head and cervical spine was performed following the standard protocol without intravenous contrast. Multiplanar CT image reconstructions of the cervical spine were also generated. COMPARISON:  CT head and cervical spine 09/27/2015 FINDINGS: CT HEAD FINDINGS Cavum septum pellucidum. Otherwise normal ventricular morphology. Generalized atrophy. No midline shift or mass effect. Otherwise normal appearance of brain parenchyma. No intracranial hemorrhage, mass lesion, evidence acute infarction or extra-axial fluid collection. Visualized paranasal sinuses and mastoid air cells clear. No acute osseous findings. CT CERVICAL SPINE FINDINGS Lung apices clear. Prevertebral soft tissues normal thickness. Vertebral body and disc space heights maintained. Minimal anterior spurring C5-C6 and C6-C7 with mild posterior spurring C3-C4. No fracture, subluxation or bone destruction. Multilevel degenerative changes. Visualized skullbase intact. Scattered atherosclerotic calcifications of the carotid systems. IMPRESSION: No acute intracranial abnormalities. Minimal degenerative disc and facet disease changes cervical spine. No acute cervical spine abnormalities. Electronically Signed   By: Lavonia Dana M.D.   On: 12/02/2015 16:45   I have personally reviewed and evaluated these images and lab results as part of my medical decision-making.   EKG Interpretation None      MDM   Final diagnoses:  Fall, initial encounter  Community acquired pneumonia  Abdominal aortic aneurysm (AAA) without  rupture ([place])  ESRD (end stage renal disease) ([place])    Patient is alert and not septic appearing. Vital signs are acceptable. I discussed the findings of the abdominal aortic aneurysm and the nodular changes in the lungs. Will Rx Levaquin 250 mg daily secondary to his renal function.  Patient understands he needs repeat testing for both his aneurysm and his lungs.  This was discussed with the patient, his wife, his daughter   Nat Christen, MD 12/02/15 [phone]

## 2015-12-05 DIAGNOSIS — N186 End stage renal disease: Secondary | ICD-10-CM | POA: Diagnosis not present

## 2015-12-05 DIAGNOSIS — D509 Iron deficiency anemia, unspecified: Secondary | ICD-10-CM | POA: Diagnosis not present

## 2015-12-05 DIAGNOSIS — D631 Anemia in chronic kidney disease: Secondary | ICD-10-CM | POA: Diagnosis not present

## 2015-12-05 DIAGNOSIS — E1129 Type 2 diabetes mellitus with other diabetic kidney complication: Secondary | ICD-10-CM | POA: Diagnosis not present

## 2015-12-05 DIAGNOSIS — N2581 Secondary hyperparathyroidism of renal origin: Secondary | ICD-10-CM | POA: Diagnosis not present

## 2015-12-05 MED FILL — LEVOTHYROXINE 125 MCG TAB: 125 | 90 days supply | Qty: 90 | Fill #0

## 2015-12-06 DIAGNOSIS — N185 Chronic kidney disease, stage 5: Secondary | ICD-10-CM | POA: Diagnosis not present

## 2015-12-06 DIAGNOSIS — E782 Mixed hyperlipidemia: Secondary | ICD-10-CM | POA: Diagnosis not present

## 2015-12-06 DIAGNOSIS — I10 Essential (primary) hypertension: Secondary | ICD-10-CM | POA: Diagnosis not present

## 2015-12-06 DIAGNOSIS — E039 Hypothyroidism, unspecified: Secondary | ICD-10-CM | POA: Diagnosis not present

## 2015-12-06 DIAGNOSIS — D519 Vitamin B12 deficiency anemia, unspecified: Secondary | ICD-10-CM | POA: Diagnosis not present

## 2015-12-06 DIAGNOSIS — E1122 Type 2 diabetes mellitus with diabetic chronic kidney disease: Secondary | ICD-10-CM | POA: Diagnosis not present

## 2015-12-06 DIAGNOSIS — D509 Iron deficiency anemia, unspecified: Secondary | ICD-10-CM | POA: Diagnosis not present

## 2015-12-06 DIAGNOSIS — K219 Gastro-esophageal reflux disease without esophagitis: Secondary | ICD-10-CM | POA: Diagnosis not present

## 2015-12-07 DIAGNOSIS — D509 Iron deficiency anemia, unspecified: Secondary | ICD-10-CM | POA: Diagnosis not present

## 2015-12-07 DIAGNOSIS — D631 Anemia in chronic kidney disease: Secondary | ICD-10-CM | POA: Diagnosis not present

## 2015-12-07 DIAGNOSIS — E1129 Type 2 diabetes mellitus with other diabetic kidney complication: Secondary | ICD-10-CM | POA: Diagnosis not present

## 2015-12-07 DIAGNOSIS — I714 Abdominal aortic aneurysm, without rupture, unspecified: Secondary | ICD-10-CM | POA: Insufficient documentation

## 2015-12-07 DIAGNOSIS — R296 Repeated falls: Secondary | ICD-10-CM | POA: Insufficient documentation

## 2015-12-07 DIAGNOSIS — N186 End stage renal disease: Secondary | ICD-10-CM | POA: Diagnosis not present

## 2015-12-07 DIAGNOSIS — M21372 Foot drop, left foot: Secondary | ICD-10-CM | POA: Insufficient documentation

## 2015-12-07 DIAGNOSIS — N2581 Secondary hyperparathyroidism of renal origin: Secondary | ICD-10-CM | POA: Diagnosis not present

## 2015-12-08 DIAGNOSIS — E1122 Type 2 diabetes mellitus with diabetic chronic kidney disease: Secondary | ICD-10-CM | POA: Diagnosis not present

## 2015-12-08 DIAGNOSIS — E782 Mixed hyperlipidemia: Secondary | ICD-10-CM | POA: Diagnosis not present

## 2015-12-08 DIAGNOSIS — G6289 Other specified polyneuropathies: Secondary | ICD-10-CM | POA: Diagnosis not present

## 2015-12-08 DIAGNOSIS — I10 Essential (primary) hypertension: Secondary | ICD-10-CM | POA: Diagnosis not present

## 2015-12-08 DIAGNOSIS — E1142 Type 2 diabetes mellitus with diabetic polyneuropathy: Secondary | ICD-10-CM | POA: Diagnosis not present

## 2015-12-08 DIAGNOSIS — J449 Chronic obstructive pulmonary disease, unspecified: Secondary | ICD-10-CM | POA: Diagnosis not present

## 2015-12-08 DIAGNOSIS — K219 Gastro-esophageal reflux disease without esophagitis: Secondary | ICD-10-CM | POA: Diagnosis not present

## 2015-12-08 DIAGNOSIS — I739 Peripheral vascular disease, unspecified: Secondary | ICD-10-CM | POA: Diagnosis not present

## 2015-12-09 DIAGNOSIS — D631 Anemia in chronic kidney disease: Secondary | ICD-10-CM | POA: Diagnosis not present

## 2015-12-09 DIAGNOSIS — E1129 Type 2 diabetes mellitus with other diabetic kidney complication: Secondary | ICD-10-CM | POA: Diagnosis not present

## 2015-12-09 DIAGNOSIS — N186 End stage renal disease: Secondary | ICD-10-CM | POA: Diagnosis not present

## 2015-12-09 DIAGNOSIS — N2581 Secondary hyperparathyroidism of renal origin: Secondary | ICD-10-CM | POA: Diagnosis not present

## 2015-12-09 DIAGNOSIS — D509 Iron deficiency anemia, unspecified: Secondary | ICD-10-CM | POA: Diagnosis not present

## 2015-12-11 DIAGNOSIS — E1129 Type 2 diabetes mellitus with other diabetic kidney complication: Secondary | ICD-10-CM | POA: Diagnosis not present

## 2015-12-11 DIAGNOSIS — Z992 Dependence on renal dialysis: Secondary | ICD-10-CM | POA: Diagnosis not present

## 2015-12-11 DIAGNOSIS — N186 End stage renal disease: Secondary | ICD-10-CM | POA: Diagnosis not present

## 2015-12-12 DIAGNOSIS — D631 Anemia in chronic kidney disease: Secondary | ICD-10-CM | POA: Diagnosis not present

## 2015-12-12 DIAGNOSIS — E1129 Type 2 diabetes mellitus with other diabetic kidney complication: Secondary | ICD-10-CM | POA: Diagnosis not present

## 2015-12-12 DIAGNOSIS — D509 Iron deficiency anemia, unspecified: Secondary | ICD-10-CM | POA: Diagnosis not present

## 2015-12-12 DIAGNOSIS — N2581 Secondary hyperparathyroidism of renal origin: Secondary | ICD-10-CM | POA: Diagnosis not present

## 2015-12-12 DIAGNOSIS — N186 End stage renal disease: Secondary | ICD-10-CM | POA: Diagnosis not present

## 2015-12-13 MED FILL — diazePAM 10 MG TABS: 10 | 30 days supply | Qty: 90 | Fill #3

## 2015-12-14 DIAGNOSIS — D631 Anemia in chronic kidney disease: Secondary | ICD-10-CM | POA: Diagnosis not present

## 2015-12-14 DIAGNOSIS — N2581 Secondary hyperparathyroidism of renal origin: Secondary | ICD-10-CM | POA: Diagnosis not present

## 2015-12-14 DIAGNOSIS — N186 End stage renal disease: Secondary | ICD-10-CM | POA: Diagnosis not present

## 2015-12-14 DIAGNOSIS — D509 Iron deficiency anemia, unspecified: Secondary | ICD-10-CM | POA: Diagnosis not present

## 2015-12-14 DIAGNOSIS — E1129 Type 2 diabetes mellitus with other diabetic kidney complication: Secondary | ICD-10-CM | POA: Diagnosis not present

## 2015-12-16 DIAGNOSIS — N2581 Secondary hyperparathyroidism of renal origin: Secondary | ICD-10-CM | POA: Diagnosis not present

## 2015-12-16 DIAGNOSIS — E1129 Type 2 diabetes mellitus with other diabetic kidney complication: Secondary | ICD-10-CM | POA: Diagnosis not present

## 2015-12-16 DIAGNOSIS — E668 Other obesity: Secondary | ICD-10-CM | POA: Diagnosis not present

## 2015-12-16 DIAGNOSIS — N186 End stage renal disease: Secondary | ICD-10-CM | POA: Diagnosis not present

## 2015-12-16 DIAGNOSIS — D631 Anemia in chronic kidney disease: Secondary | ICD-10-CM | POA: Diagnosis not present

## 2015-12-16 DIAGNOSIS — D509 Iron deficiency anemia, unspecified: Secondary | ICD-10-CM | POA: Diagnosis not present

## 2015-12-19 DIAGNOSIS — D509 Iron deficiency anemia, unspecified: Secondary | ICD-10-CM | POA: Diagnosis not present

## 2015-12-19 DIAGNOSIS — D631 Anemia in chronic kidney disease: Secondary | ICD-10-CM | POA: Diagnosis not present

## 2015-12-19 DIAGNOSIS — N186 End stage renal disease: Secondary | ICD-10-CM | POA: Diagnosis not present

## 2015-12-19 DIAGNOSIS — N2581 Secondary hyperparathyroidism of renal origin: Secondary | ICD-10-CM | POA: Diagnosis not present

## 2015-12-19 DIAGNOSIS — E1129 Type 2 diabetes mellitus with other diabetic kidney complication: Secondary | ICD-10-CM | POA: Diagnosis not present

## 2015-12-19 MED FILL — SENSIPAR 60 MG TABLET: 60 | 30 days supply | Qty: 30 | Fill #0

## 2015-12-20 DIAGNOSIS — G609 Hereditary and idiopathic neuropathy, unspecified: Secondary | ICD-10-CM | POA: Diagnosis not present

## 2015-12-20 DIAGNOSIS — E1122 Type 2 diabetes mellitus with diabetic chronic kidney disease: Secondary | ICD-10-CM | POA: Diagnosis not present

## 2015-12-20 DIAGNOSIS — L609 Nail disorder, unspecified: Secondary | ICD-10-CM | POA: Diagnosis not present

## 2015-12-21 DIAGNOSIS — D631 Anemia in chronic kidney disease: Secondary | ICD-10-CM | POA: Diagnosis not present

## 2015-12-21 DIAGNOSIS — D509 Iron deficiency anemia, unspecified: Secondary | ICD-10-CM | POA: Diagnosis not present

## 2015-12-21 DIAGNOSIS — N2581 Secondary hyperparathyroidism of renal origin: Secondary | ICD-10-CM | POA: Diagnosis not present

## 2015-12-21 DIAGNOSIS — N186 End stage renal disease: Secondary | ICD-10-CM | POA: Diagnosis not present

## 2015-12-21 DIAGNOSIS — E1129 Type 2 diabetes mellitus with other diabetic kidney complication: Secondary | ICD-10-CM | POA: Diagnosis not present

## 2015-12-21 MED FILL — TOUJEO SOLOSTAR 300 UNITS/M: 300 | 28 days supply | Qty: 9 | Fill #9

## 2015-12-23 DIAGNOSIS — D509 Iron deficiency anemia, unspecified: Secondary | ICD-10-CM | POA: Diagnosis not present

## 2015-12-23 DIAGNOSIS — N186 End stage renal disease: Secondary | ICD-10-CM | POA: Diagnosis not present

## 2015-12-23 DIAGNOSIS — N2581 Secondary hyperparathyroidism of renal origin: Secondary | ICD-10-CM | POA: Diagnosis not present

## 2015-12-23 DIAGNOSIS — D631 Anemia in chronic kidney disease: Secondary | ICD-10-CM | POA: Diagnosis not present

## 2015-12-23 DIAGNOSIS — E1129 Type 2 diabetes mellitus with other diabetic kidney complication: Secondary | ICD-10-CM | POA: Diagnosis not present

## 2015-12-26 DIAGNOSIS — D509 Iron deficiency anemia, unspecified: Secondary | ICD-10-CM | POA: Diagnosis not present

## 2015-12-26 DIAGNOSIS — N2581 Secondary hyperparathyroidism of renal origin: Secondary | ICD-10-CM | POA: Diagnosis not present

## 2015-12-26 DIAGNOSIS — E1129 Type 2 diabetes mellitus with other diabetic kidney complication: Secondary | ICD-10-CM | POA: Diagnosis not present

## 2015-12-26 DIAGNOSIS — N186 End stage renal disease: Secondary | ICD-10-CM | POA: Diagnosis not present

## 2015-12-26 DIAGNOSIS — D631 Anemia in chronic kidney disease: Secondary | ICD-10-CM | POA: Diagnosis not present

## 2015-12-28 DIAGNOSIS — D631 Anemia in chronic kidney disease: Secondary | ICD-10-CM | POA: Diagnosis not present

## 2015-12-28 DIAGNOSIS — N186 End stage renal disease: Secondary | ICD-10-CM | POA: Diagnosis not present

## 2015-12-28 DIAGNOSIS — D509 Iron deficiency anemia, unspecified: Secondary | ICD-10-CM | POA: Diagnosis not present

## 2015-12-28 DIAGNOSIS — N2581 Secondary hyperparathyroidism of renal origin: Secondary | ICD-10-CM | POA: Diagnosis not present

## 2015-12-28 DIAGNOSIS — E1129 Type 2 diabetes mellitus with other diabetic kidney complication: Secondary | ICD-10-CM | POA: Diagnosis not present

## 2015-12-30 DIAGNOSIS — D631 Anemia in chronic kidney disease: Secondary | ICD-10-CM | POA: Diagnosis not present

## 2015-12-30 DIAGNOSIS — N2581 Secondary hyperparathyroidism of renal origin: Secondary | ICD-10-CM | POA: Diagnosis not present

## 2015-12-30 DIAGNOSIS — E1129 Type 2 diabetes mellitus with other diabetic kidney complication: Secondary | ICD-10-CM | POA: Diagnosis not present

## 2015-12-30 DIAGNOSIS — D509 Iron deficiency anemia, unspecified: Secondary | ICD-10-CM | POA: Diagnosis not present

## 2015-12-30 DIAGNOSIS — N186 End stage renal disease: Secondary | ICD-10-CM | POA: Diagnosis not present

## 2016-01-02 DIAGNOSIS — N186 End stage renal disease: Secondary | ICD-10-CM | POA: Diagnosis not present

## 2016-01-02 DIAGNOSIS — D509 Iron deficiency anemia, unspecified: Secondary | ICD-10-CM | POA: Diagnosis not present

## 2016-01-02 DIAGNOSIS — N2581 Secondary hyperparathyroidism of renal origin: Secondary | ICD-10-CM | POA: Diagnosis not present

## 2016-01-02 DIAGNOSIS — E1129 Type 2 diabetes mellitus with other diabetic kidney complication: Secondary | ICD-10-CM | POA: Diagnosis not present

## 2016-01-02 DIAGNOSIS — D631 Anemia in chronic kidney disease: Secondary | ICD-10-CM | POA: Diagnosis not present

## 2016-01-04 DIAGNOSIS — D509 Iron deficiency anemia, unspecified: Secondary | ICD-10-CM | POA: Diagnosis not present

## 2016-01-04 DIAGNOSIS — N186 End stage renal disease: Secondary | ICD-10-CM | POA: Diagnosis not present

## 2016-01-04 DIAGNOSIS — D631 Anemia in chronic kidney disease: Secondary | ICD-10-CM | POA: Diagnosis not present

## 2016-01-04 DIAGNOSIS — N2581 Secondary hyperparathyroidism of renal origin: Secondary | ICD-10-CM | POA: Diagnosis not present

## 2016-01-04 DIAGNOSIS — E1129 Type 2 diabetes mellitus with other diabetic kidney complication: Secondary | ICD-10-CM | POA: Diagnosis not present

## 2016-01-06 DIAGNOSIS — D631 Anemia in chronic kidney disease: Secondary | ICD-10-CM | POA: Diagnosis not present

## 2016-01-06 DIAGNOSIS — N186 End stage renal disease: Secondary | ICD-10-CM | POA: Diagnosis not present

## 2016-01-06 DIAGNOSIS — E1129 Type 2 diabetes mellitus with other diabetic kidney complication: Secondary | ICD-10-CM | POA: Diagnosis not present

## 2016-01-06 DIAGNOSIS — N2581 Secondary hyperparathyroidism of renal origin: Secondary | ICD-10-CM | POA: Diagnosis not present

## 2016-01-06 DIAGNOSIS — D509 Iron deficiency anemia, unspecified: Secondary | ICD-10-CM | POA: Diagnosis not present

## 2016-01-06 MED FILL — UNIFINE PENTIPS 32GX5/32: 32G X 4 MM | 90 days supply | Qty: 100 | Fill #2

## 2016-01-09 DIAGNOSIS — N186 End stage renal disease: Secondary | ICD-10-CM | POA: Diagnosis not present

## 2016-01-09 DIAGNOSIS — E1129 Type 2 diabetes mellitus with other diabetic kidney complication: Secondary | ICD-10-CM | POA: Diagnosis not present

## 2016-01-09 DIAGNOSIS — D509 Iron deficiency anemia, unspecified: Secondary | ICD-10-CM | POA: Diagnosis not present

## 2016-01-09 DIAGNOSIS — D631 Anemia in chronic kidney disease: Secondary | ICD-10-CM | POA: Diagnosis not present

## 2016-01-09 DIAGNOSIS — N2581 Secondary hyperparathyroidism of renal origin: Secondary | ICD-10-CM | POA: Diagnosis not present

## 2016-01-11 DIAGNOSIS — D631 Anemia in chronic kidney disease: Secondary | ICD-10-CM | POA: Diagnosis not present

## 2016-01-11 DIAGNOSIS — N186 End stage renal disease: Secondary | ICD-10-CM | POA: Diagnosis not present

## 2016-01-11 DIAGNOSIS — E1129 Type 2 diabetes mellitus with other diabetic kidney complication: Secondary | ICD-10-CM | POA: Diagnosis not present

## 2016-01-11 DIAGNOSIS — R300 Dysuria: Secondary | ICD-10-CM | POA: Diagnosis not present

## 2016-01-11 DIAGNOSIS — Z992 Dependence on renal dialysis: Secondary | ICD-10-CM | POA: Diagnosis not present

## 2016-01-11 DIAGNOSIS — D509 Iron deficiency anemia, unspecified: Secondary | ICD-10-CM | POA: Diagnosis not present

## 2016-01-11 DIAGNOSIS — N2581 Secondary hyperparathyroidism of renal origin: Secondary | ICD-10-CM | POA: Diagnosis not present

## 2016-01-13 DIAGNOSIS — D509 Iron deficiency anemia, unspecified: Secondary | ICD-10-CM | POA: Diagnosis not present

## 2016-01-13 DIAGNOSIS — N186 End stage renal disease: Secondary | ICD-10-CM | POA: Diagnosis not present

## 2016-01-13 DIAGNOSIS — N2581 Secondary hyperparathyroidism of renal origin: Secondary | ICD-10-CM | POA: Diagnosis not present

## 2016-01-13 DIAGNOSIS — E1129 Type 2 diabetes mellitus with other diabetic kidney complication: Secondary | ICD-10-CM | POA: Diagnosis not present

## 2016-01-13 DIAGNOSIS — D631 Anemia in chronic kidney disease: Secondary | ICD-10-CM | POA: Diagnosis not present

## 2016-01-16 DIAGNOSIS — D509 Iron deficiency anemia, unspecified: Secondary | ICD-10-CM | POA: Diagnosis not present

## 2016-01-16 DIAGNOSIS — N2581 Secondary hyperparathyroidism of renal origin: Secondary | ICD-10-CM | POA: Diagnosis not present

## 2016-01-16 DIAGNOSIS — D631 Anemia in chronic kidney disease: Secondary | ICD-10-CM | POA: Diagnosis not present

## 2016-01-16 DIAGNOSIS — N186 End stage renal disease: Secondary | ICD-10-CM | POA: Diagnosis not present

## 2016-01-16 DIAGNOSIS — E1129 Type 2 diabetes mellitus with other diabetic kidney complication: Secondary | ICD-10-CM | POA: Diagnosis not present

## 2016-01-16 DIAGNOSIS — E668 Other obesity: Secondary | ICD-10-CM | POA: Diagnosis not present

## 2016-01-17 DIAGNOSIS — I129 Hypertensive chronic kidney disease with stage 1 through stage 4 chronic kidney disease, or unspecified chronic kidney disease: Secondary | ICD-10-CM | POA: Diagnosis not present

## 2016-01-17 DIAGNOSIS — H269 Unspecified cataract: Secondary | ICD-10-CM | POA: Diagnosis not present

## 2016-01-17 DIAGNOSIS — E1140 Type 2 diabetes mellitus with diabetic neuropathy, unspecified: Secondary | ICD-10-CM | POA: Diagnosis not present

## 2016-01-17 DIAGNOSIS — N186 End stage renal disease: Secondary | ICD-10-CM | POA: Diagnosis not present

## 2016-01-17 DIAGNOSIS — H527 Unspecified disorder of refraction: Secondary | ICD-10-CM | POA: Diagnosis not present

## 2016-01-17 DIAGNOSIS — Z992 Dependence on renal dialysis: Secondary | ICD-10-CM | POA: Diagnosis not present

## 2016-01-17 DIAGNOSIS — E119 Type 2 diabetes mellitus without complications: Secondary | ICD-10-CM | POA: Diagnosis not present

## 2016-01-18 DIAGNOSIS — D509 Iron deficiency anemia, unspecified: Secondary | ICD-10-CM | POA: Diagnosis not present

## 2016-01-18 DIAGNOSIS — D631 Anemia in chronic kidney disease: Secondary | ICD-10-CM | POA: Diagnosis not present

## 2016-01-18 DIAGNOSIS — N2581 Secondary hyperparathyroidism of renal origin: Secondary | ICD-10-CM | POA: Diagnosis not present

## 2016-01-18 DIAGNOSIS — E1129 Type 2 diabetes mellitus with other diabetic kidney complication: Secondary | ICD-10-CM | POA: Diagnosis not present

## 2016-01-18 DIAGNOSIS — N186 End stage renal disease: Secondary | ICD-10-CM | POA: Diagnosis not present

## 2016-01-20 DIAGNOSIS — N2581 Secondary hyperparathyroidism of renal origin: Secondary | ICD-10-CM | POA: Diagnosis not present

## 2016-01-20 DIAGNOSIS — D631 Anemia in chronic kidney disease: Secondary | ICD-10-CM | POA: Diagnosis not present

## 2016-01-20 DIAGNOSIS — E1129 Type 2 diabetes mellitus with other diabetic kidney complication: Secondary | ICD-10-CM | POA: Diagnosis not present

## 2016-01-20 DIAGNOSIS — N186 End stage renal disease: Secondary | ICD-10-CM | POA: Diagnosis not present

## 2016-01-20 DIAGNOSIS — D509 Iron deficiency anemia, unspecified: Secondary | ICD-10-CM | POA: Diagnosis not present

## 2016-01-23 DIAGNOSIS — N186 End stage renal disease: Secondary | ICD-10-CM | POA: Diagnosis not present

## 2016-01-23 DIAGNOSIS — E1129 Type 2 diabetes mellitus with other diabetic kidney complication: Secondary | ICD-10-CM | POA: Diagnosis not present

## 2016-01-23 DIAGNOSIS — D509 Iron deficiency anemia, unspecified: Secondary | ICD-10-CM | POA: Diagnosis not present

## 2016-01-23 DIAGNOSIS — D631 Anemia in chronic kidney disease: Secondary | ICD-10-CM | POA: Diagnosis not present

## 2016-01-23 DIAGNOSIS — N2581 Secondary hyperparathyroidism of renal origin: Secondary | ICD-10-CM | POA: Diagnosis not present

## 2016-01-23 MED FILL — RENVELA 800 MG TABLET: 800 | 30 days supply | Qty: 420 | Fill #3

## 2016-01-23 MED FILL — DIALYVITE WITH ZINC TABLET: 90 days supply | Qty: 90 | Fill #1 | Status: TO

## 2016-01-24 DIAGNOSIS — M21372 Foot drop, left foot: Secondary | ICD-10-CM | POA: Diagnosis not present

## 2016-01-25 DIAGNOSIS — E1129 Type 2 diabetes mellitus with other diabetic kidney complication: Secondary | ICD-10-CM | POA: Diagnosis not present

## 2016-01-25 DIAGNOSIS — N186 End stage renal disease: Secondary | ICD-10-CM | POA: Diagnosis not present

## 2016-01-25 DIAGNOSIS — N2581 Secondary hyperparathyroidism of renal origin: Secondary | ICD-10-CM | POA: Diagnosis not present

## 2016-01-25 DIAGNOSIS — D631 Anemia in chronic kidney disease: Secondary | ICD-10-CM | POA: Diagnosis not present

## 2016-01-25 DIAGNOSIS — D509 Iron deficiency anemia, unspecified: Secondary | ICD-10-CM | POA: Diagnosis not present

## 2016-01-26 MED FILL — TOUJEO SOLOSTAR 300 UNITS/M: 300 | 28 days supply | Qty: 9 | Fill #10

## 2016-01-27 DIAGNOSIS — E1129 Type 2 diabetes mellitus with other diabetic kidney complication: Secondary | ICD-10-CM | POA: Diagnosis not present

## 2016-01-27 DIAGNOSIS — D631 Anemia in chronic kidney disease: Secondary | ICD-10-CM | POA: Diagnosis not present

## 2016-01-27 DIAGNOSIS — N2581 Secondary hyperparathyroidism of renal origin: Secondary | ICD-10-CM | POA: Diagnosis not present

## 2016-01-27 DIAGNOSIS — N186 End stage renal disease: Secondary | ICD-10-CM | POA: Diagnosis not present

## 2016-01-27 DIAGNOSIS — D509 Iron deficiency anemia, unspecified: Secondary | ICD-10-CM | POA: Diagnosis not present

## 2016-01-30 DIAGNOSIS — D631 Anemia in chronic kidney disease: Secondary | ICD-10-CM | POA: Diagnosis not present

## 2016-01-30 DIAGNOSIS — N186 End stage renal disease: Secondary | ICD-10-CM | POA: Diagnosis not present

## 2016-01-30 DIAGNOSIS — D509 Iron deficiency anemia, unspecified: Secondary | ICD-10-CM | POA: Diagnosis not present

## 2016-01-30 DIAGNOSIS — N2581 Secondary hyperparathyroidism of renal origin: Secondary | ICD-10-CM | POA: Diagnosis not present

## 2016-01-30 DIAGNOSIS — E1129 Type 2 diabetes mellitus with other diabetic kidney complication: Secondary | ICD-10-CM | POA: Diagnosis not present

## 2016-02-01 DIAGNOSIS — D631 Anemia in chronic kidney disease: Secondary | ICD-10-CM | POA: Diagnosis not present

## 2016-02-01 DIAGNOSIS — D509 Iron deficiency anemia, unspecified: Secondary | ICD-10-CM | POA: Diagnosis not present

## 2016-02-01 DIAGNOSIS — N186 End stage renal disease: Secondary | ICD-10-CM | POA: Diagnosis not present

## 2016-02-01 DIAGNOSIS — N2581 Secondary hyperparathyroidism of renal origin: Secondary | ICD-10-CM | POA: Diagnosis not present

## 2016-02-01 DIAGNOSIS — E1129 Type 2 diabetes mellitus with other diabetic kidney complication: Secondary | ICD-10-CM | POA: Diagnosis not present

## 2016-02-03 DIAGNOSIS — D509 Iron deficiency anemia, unspecified: Secondary | ICD-10-CM | POA: Diagnosis not present

## 2016-02-03 DIAGNOSIS — D631 Anemia in chronic kidney disease: Secondary | ICD-10-CM | POA: Diagnosis not present

## 2016-02-03 DIAGNOSIS — N186 End stage renal disease: Secondary | ICD-10-CM | POA: Diagnosis not present

## 2016-02-03 DIAGNOSIS — N2581 Secondary hyperparathyroidism of renal origin: Secondary | ICD-10-CM | POA: Diagnosis not present

## 2016-02-03 DIAGNOSIS — E1129 Type 2 diabetes mellitus with other diabetic kidney complication: Secondary | ICD-10-CM | POA: Diagnosis not present

## 2016-02-03 MED FILL — GABAPENTIN 300 MG CAPSULE: 300 | 90 days supply | Qty: 270 | Fill #3

## 2016-02-06 DIAGNOSIS — D509 Iron deficiency anemia, unspecified: Secondary | ICD-10-CM | POA: Diagnosis not present

## 2016-02-06 DIAGNOSIS — E1129 Type 2 diabetes mellitus with other diabetic kidney complication: Secondary | ICD-10-CM | POA: Diagnosis not present

## 2016-02-06 DIAGNOSIS — N186 End stage renal disease: Secondary | ICD-10-CM | POA: Diagnosis not present

## 2016-02-06 DIAGNOSIS — N2581 Secondary hyperparathyroidism of renal origin: Secondary | ICD-10-CM | POA: Diagnosis not present

## 2016-02-06 DIAGNOSIS — D631 Anemia in chronic kidney disease: Secondary | ICD-10-CM | POA: Diagnosis not present

## 2016-02-07 DIAGNOSIS — G609 Hereditary and idiopathic neuropathy, unspecified: Secondary | ICD-10-CM | POA: Diagnosis not present

## 2016-02-07 DIAGNOSIS — E1122 Type 2 diabetes mellitus with diabetic chronic kidney disease: Secondary | ICD-10-CM | POA: Diagnosis not present

## 2016-02-07 DIAGNOSIS — R234 Changes in skin texture: Secondary | ICD-10-CM | POA: Diagnosis not present

## 2016-02-08 DIAGNOSIS — E1129 Type 2 diabetes mellitus with other diabetic kidney complication: Secondary | ICD-10-CM | POA: Diagnosis not present

## 2016-02-08 DIAGNOSIS — D631 Anemia in chronic kidney disease: Secondary | ICD-10-CM | POA: Diagnosis not present

## 2016-02-08 DIAGNOSIS — N186 End stage renal disease: Secondary | ICD-10-CM | POA: Diagnosis not present

## 2016-02-08 DIAGNOSIS — D509 Iron deficiency anemia, unspecified: Secondary | ICD-10-CM | POA: Diagnosis not present

## 2016-02-08 DIAGNOSIS — N2581 Secondary hyperparathyroidism of renal origin: Secondary | ICD-10-CM | POA: Diagnosis not present

## 2016-02-09 DIAGNOSIS — N281 Cyst of kidney, acquired: Secondary | ICD-10-CM | POA: Diagnosis not present

## 2016-02-09 DIAGNOSIS — I714 Abdominal aortic aneurysm, without rupture: Secondary | ICD-10-CM | POA: Diagnosis not present

## 2016-02-09 DIAGNOSIS — Z7682 Awaiting organ transplant status: Secondary | ICD-10-CM | POA: Diagnosis not present

## 2016-02-09 DIAGNOSIS — M7989 Other specified soft tissue disorders: Secondary | ICD-10-CM | POA: Diagnosis not present

## 2016-02-09 DIAGNOSIS — Z01810 Encounter for preprocedural cardiovascular examination: Secondary | ICD-10-CM | POA: Diagnosis not present

## 2016-02-09 DIAGNOSIS — K8020 Calculus of gallbladder without cholecystitis without obstruction: Secondary | ICD-10-CM | POA: Diagnosis not present

## 2016-02-10 DIAGNOSIS — N186 End stage renal disease: Secondary | ICD-10-CM | POA: Diagnosis not present

## 2016-02-10 DIAGNOSIS — D509 Iron deficiency anemia, unspecified: Secondary | ICD-10-CM | POA: Diagnosis not present

## 2016-02-10 DIAGNOSIS — D631 Anemia in chronic kidney disease: Secondary | ICD-10-CM | POA: Diagnosis not present

## 2016-02-10 DIAGNOSIS — E1129 Type 2 diabetes mellitus with other diabetic kidney complication: Secondary | ICD-10-CM | POA: Diagnosis not present

## 2016-02-10 DIAGNOSIS — Z992 Dependence on renal dialysis: Secondary | ICD-10-CM | POA: Diagnosis not present

## 2016-02-10 DIAGNOSIS — N2581 Secondary hyperparathyroidism of renal origin: Secondary | ICD-10-CM | POA: Diagnosis not present

## 2016-02-10 MED FILL — ATORVASTATIN 80 MG TABLET: 80 | 90 days supply | Qty: 90 | Fill #0

## 2016-02-13 DIAGNOSIS — E1129 Type 2 diabetes mellitus with other diabetic kidney complication: Secondary | ICD-10-CM | POA: Diagnosis not present

## 2016-02-13 DIAGNOSIS — N2581 Secondary hyperparathyroidism of renal origin: Secondary | ICD-10-CM | POA: Diagnosis not present

## 2016-02-13 DIAGNOSIS — D631 Anemia in chronic kidney disease: Secondary | ICD-10-CM | POA: Diagnosis not present

## 2016-02-13 DIAGNOSIS — D509 Iron deficiency anemia, unspecified: Secondary | ICD-10-CM | POA: Diagnosis not present

## 2016-02-13 DIAGNOSIS — N186 End stage renal disease: Secondary | ICD-10-CM | POA: Diagnosis not present

## 2016-02-13 DIAGNOSIS — Z23 Encounter for immunization: Secondary | ICD-10-CM | POA: Diagnosis not present

## 2016-02-15 DIAGNOSIS — Z23 Encounter for immunization: Secondary | ICD-10-CM | POA: Diagnosis not present

## 2016-02-15 DIAGNOSIS — N2581 Secondary hyperparathyroidism of renal origin: Secondary | ICD-10-CM | POA: Diagnosis not present

## 2016-02-15 DIAGNOSIS — D509 Iron deficiency anemia, unspecified: Secondary | ICD-10-CM | POA: Diagnosis not present

## 2016-02-15 DIAGNOSIS — D631 Anemia in chronic kidney disease: Secondary | ICD-10-CM | POA: Diagnosis not present

## 2016-02-15 DIAGNOSIS — N186 End stage renal disease: Secondary | ICD-10-CM | POA: Diagnosis not present

## 2016-02-15 DIAGNOSIS — E668 Other obesity: Secondary | ICD-10-CM | POA: Diagnosis not present

## 2016-02-15 DIAGNOSIS — E1129 Type 2 diabetes mellitus with other diabetic kidney complication: Secondary | ICD-10-CM | POA: Diagnosis not present

## 2016-02-16 DIAGNOSIS — G629 Polyneuropathy, unspecified: Secondary | ICD-10-CM | POA: Insufficient documentation

## 2016-02-17 DIAGNOSIS — D631 Anemia in chronic kidney disease: Secondary | ICD-10-CM | POA: Diagnosis not present

## 2016-02-17 DIAGNOSIS — E1129 Type 2 diabetes mellitus with other diabetic kidney complication: Secondary | ICD-10-CM | POA: Diagnosis not present

## 2016-02-17 DIAGNOSIS — N186 End stage renal disease: Secondary | ICD-10-CM | POA: Diagnosis not present

## 2016-02-17 DIAGNOSIS — Z23 Encounter for immunization: Secondary | ICD-10-CM | POA: Diagnosis not present

## 2016-02-17 DIAGNOSIS — D509 Iron deficiency anemia, unspecified: Secondary | ICD-10-CM | POA: Diagnosis not present

## 2016-02-17 DIAGNOSIS — N2581 Secondary hyperparathyroidism of renal origin: Secondary | ICD-10-CM | POA: Diagnosis not present

## 2016-02-20 DIAGNOSIS — N2581 Secondary hyperparathyroidism of renal origin: Secondary | ICD-10-CM | POA: Diagnosis not present

## 2016-02-20 DIAGNOSIS — D631 Anemia in chronic kidney disease: Secondary | ICD-10-CM | POA: Diagnosis not present

## 2016-02-20 DIAGNOSIS — Z23 Encounter for immunization: Secondary | ICD-10-CM | POA: Diagnosis not present

## 2016-02-20 DIAGNOSIS — N186 End stage renal disease: Secondary | ICD-10-CM | POA: Diagnosis not present

## 2016-02-20 DIAGNOSIS — E1129 Type 2 diabetes mellitus with other diabetic kidney complication: Secondary | ICD-10-CM | POA: Diagnosis not present

## 2016-02-20 DIAGNOSIS — D509 Iron deficiency anemia, unspecified: Secondary | ICD-10-CM | POA: Diagnosis not present

## 2016-02-22 DIAGNOSIS — D631 Anemia in chronic kidney disease: Secondary | ICD-10-CM | POA: Diagnosis not present

## 2016-02-22 DIAGNOSIS — D509 Iron deficiency anemia, unspecified: Secondary | ICD-10-CM | POA: Diagnosis not present

## 2016-02-22 DIAGNOSIS — N186 End stage renal disease: Secondary | ICD-10-CM | POA: Diagnosis not present

## 2016-02-22 DIAGNOSIS — Z23 Encounter for immunization: Secondary | ICD-10-CM | POA: Diagnosis not present

## 2016-02-22 DIAGNOSIS — E1129 Type 2 diabetes mellitus with other diabetic kidney complication: Secondary | ICD-10-CM | POA: Diagnosis not present

## 2016-02-22 DIAGNOSIS — N2581 Secondary hyperparathyroidism of renal origin: Secondary | ICD-10-CM | POA: Diagnosis not present

## 2016-02-24 ENCOUNTER — Encounter: Payer: Self-pay | Admitting: Vascular Surgery

## 2016-02-24 DIAGNOSIS — E1129 Type 2 diabetes mellitus with other diabetic kidney complication: Secondary | ICD-10-CM | POA: Diagnosis not present

## 2016-02-24 DIAGNOSIS — D631 Anemia in chronic kidney disease: Secondary | ICD-10-CM | POA: Diagnosis not present

## 2016-02-24 DIAGNOSIS — N2581 Secondary hyperparathyroidism of renal origin: Secondary | ICD-10-CM | POA: Diagnosis not present

## 2016-02-24 DIAGNOSIS — D509 Iron deficiency anemia, unspecified: Secondary | ICD-10-CM | POA: Diagnosis not present

## 2016-02-24 DIAGNOSIS — N186 End stage renal disease: Secondary | ICD-10-CM | POA: Diagnosis not present

## 2016-02-24 DIAGNOSIS — Z23 Encounter for immunization: Secondary | ICD-10-CM | POA: Diagnosis not present

## 2016-02-27 DIAGNOSIS — D631 Anemia in chronic kidney disease: Secondary | ICD-10-CM | POA: Diagnosis not present

## 2016-02-27 DIAGNOSIS — N2581 Secondary hyperparathyroidism of renal origin: Secondary | ICD-10-CM | POA: Diagnosis not present

## 2016-02-27 DIAGNOSIS — Z23 Encounter for immunization: Secondary | ICD-10-CM | POA: Diagnosis not present

## 2016-02-27 DIAGNOSIS — E1129 Type 2 diabetes mellitus with other diabetic kidney complication: Secondary | ICD-10-CM | POA: Diagnosis not present

## 2016-02-27 DIAGNOSIS — N186 End stage renal disease: Secondary | ICD-10-CM | POA: Diagnosis not present

## 2016-02-27 DIAGNOSIS — D509 Iron deficiency anemia, unspecified: Secondary | ICD-10-CM | POA: Diagnosis not present

## 2016-02-27 MED FILL — SENSIPAR 60 MG TABLET: 60 | 30 days supply | Qty: 30 | Fill #1

## 2016-02-27 MED FILL — TOUJEO SOLOSTAR 300 UNITS/M: 300 | 28 days supply | Qty: 9 | Fill #11

## 2016-02-28 DIAGNOSIS — E1122 Type 2 diabetes mellitus with diabetic chronic kidney disease: Secondary | ICD-10-CM | POA: Diagnosis not present

## 2016-02-28 DIAGNOSIS — G609 Hereditary and idiopathic neuropathy, unspecified: Secondary | ICD-10-CM | POA: Diagnosis not present

## 2016-02-28 DIAGNOSIS — R234 Changes in skin texture: Secondary | ICD-10-CM | POA: Diagnosis not present

## 2016-02-29 DIAGNOSIS — E1129 Type 2 diabetes mellitus with other diabetic kidney complication: Secondary | ICD-10-CM | POA: Diagnosis not present

## 2016-02-29 DIAGNOSIS — D631 Anemia in chronic kidney disease: Secondary | ICD-10-CM | POA: Diagnosis not present

## 2016-02-29 DIAGNOSIS — N2581 Secondary hyperparathyroidism of renal origin: Secondary | ICD-10-CM | POA: Diagnosis not present

## 2016-02-29 DIAGNOSIS — D509 Iron deficiency anemia, unspecified: Secondary | ICD-10-CM | POA: Diagnosis not present

## 2016-02-29 DIAGNOSIS — N186 End stage renal disease: Secondary | ICD-10-CM | POA: Diagnosis not present

## 2016-02-29 DIAGNOSIS — Z23 Encounter for immunization: Secondary | ICD-10-CM | POA: Diagnosis not present

## 2016-03-01 ENCOUNTER — Other Ambulatory Visit: Payer: Self-pay

## 2016-03-01 ENCOUNTER — Encounter: Payer: Self-pay | Admitting: Vascular Surgery

## 2016-03-01 ENCOUNTER — Ambulatory Visit (INDEPENDENT_AMBULATORY_CARE_PROVIDER_SITE_OTHER): Payer: Medicare Other | Admitting: Vascular Surgery

## 2016-03-01 VITALS — BP 153/94 | HR 103 | Temp 98.0°F | Resp 18 | Ht 76.0 in | Wt 261.0 lb

## 2016-03-01 DIAGNOSIS — N186 End stage renal disease: Secondary | ICD-10-CM | POA: Diagnosis not present

## 2016-03-01 DIAGNOSIS — T82898A Other specified complication of vascular prosthetic devices, implants and grafts, initial encounter: Secondary | ICD-10-CM | POA: Diagnosis not present

## 2016-03-01 DIAGNOSIS — Z992 Dependence on renal dialysis: Secondary | ICD-10-CM

## 2016-03-01 DIAGNOSIS — T82510A Breakdown (mechanical) of surgically created arteriovenous fistula, initial encounter: Secondary | ICD-10-CM

## 2016-03-01 NOTE — Progress Notes (Signed)
Filed Vitals:   03/01/16 1401 03/01/16 1406  BP: 160/88 153/94  Pulse: 103 103  Temp: 98 F (36.7 C)   Resp: 18   Height: 6\' 4"  (1.93 m)   Weight: 261 lb (118.389 kg)   SpO2: 96%

## 2016-03-01 NOTE — H&P (Signed)
HISTORY AND PHYSICAL     CC:  Follow up Referring Provider:  Caryl Bis, MD  HPI: This is a 65 y.o. male who underwent a left RC AVF in 2012 and subsequent revision.  On 12/18/12, he underwent placement of a new left BC AVF and subsequent ligation of competing branches with superficialization of fistula on 06/18/13.  He has done well with this.  He presents today with a pseudoaneurysm of the fistula.  He does have two button holes, which he dialyzes through.    He dialyzes M/W/F.    He is on a statin for cholesterol management.  He is on insulin for diabetes.    Past Medical History  Diagnosis Date  . Anemia   . Chronic kidney disease   . Anxiety   . Depression   . COPD (chronic obstructive pulmonary disease) ([place])     Pt denies  . Pneumonia     Hx: of  . GERD (gastroesophageal reflux disease)   . H/O hiatal hernia   . Headache(784.0)     Hx: Migraines  . Arthritis   . Numbness of toes     toes and feet  . Diabetes mellitus without complication ([place])   . Hypertension   . Renal insufficiency   . Shortness of breath dyspnea     Past Surgical History  Procedure Laterality Date  . Inguinal hernia repair      ,  times   2  . Tonsillectomy    . Av fistula placement  2012       left arm   . Colonoscopy  10/26/2011    Procedure: COLONOSCOPY;  Surgeon: Rogene Houston, MD;  [facility];  Service: Endoscopy;  Laterality: N/A;  730  . Knee arthroscopy  2011    Right Knee  . Thrombectomy w/ embolectomy Left 12/11/2012    Procedure: THROMBECTOMY ARTERIOVENOUS FISTULA;  Surgeon: Serafina Mitchell, MD;  Location: [place];  Service: Vascular;  Laterality: Left;  . Av fistula placement Left 12/18/2012    Procedure: ARTERIOVENOUS (AV) FISTULA CREATION;  Surgeon: Angelia Mould, MD;  Location: [place];  Service: Vascular;  Laterality: Left;  . Insertion of dialysis catheter Left 12/18/2012    Procedure: INSERTION OF DIALYSIS CATHETER;  Surgeon: Angelia Mould, MD;   Location: [facility] OR;  Service: Vascular;  Laterality: Left;  . Esophagogastroduodenoscopy (egd) with esophageal dilation N/A 04/23/2013    Procedure: ESOPHAGOGASTRODUODENOSCOPY (EGD) WITH ESOPHAGEAL DILATION;  Surgeon: Rogene Houston, MD;  [facility];  Service: Endoscopy;  Laterality: N/A;  200-moved to 930   . Fistula superficialization Left 06/18/2013    Procedure: FISTULA SUPERFICIALIZATION & LIGATION BRANCH X 1;  Surgeon: Mal Misty, MD;  Location: [place];  Service: Vascular;  Laterality: Left;  . Shuntogram N/A 12/09/2012    Procedure: fistulogram;  Surgeon: Serafina Mitchell, MD;  Location: [facility] CATH LAB;  Service: Cardiovascular;  Laterality: N/A;  . Embolectomy Left 12/09/2012    Procedure: EMBOLECTOMY;  Surgeon: Serafina Mitchell, MD;  Location: [facility] CATH LAB;  Service: Cardiovascular;  Laterality: Left;  left arm venous embolization  . Shuntogram Left 06/03/2013    Procedure: Fistulogram;  Surgeon: Serafina Mitchell, MD;  Location: [facility] CATH LAB;  Service: Cardiovascular;  Laterality: Left;    Allergies  Allergen Reactions  . Morphine And Related Other (See Comments)    Not effective  . Zaroxolyn [Metolazone]     Current Outpatient Prescriptions  Medication Sig Dispense  Refill  . atorvastatin (LIPITOR) 80 MG tablet Take 80 mg by mouth at bedtime.  5  . B Complex-C-Zn-Folic Acid (DIALYVITE/ZINC PO) Take 1 tablet by mouth at bedtime.     . diazepam (VALIUM) 10 MG tablet Take 10 mg by mouth 3 (three) times daily.    . diphenhydrAMINE (BENADRYL) 25 mg capsule Take 25 mg by mouth every 6 (six) hours as needed for itching.    Marland Kitchen FLUoxetine (PROZAC) 40 MG capsule Take 40 mg by mouth at bedtime.  6  . gabapentin (NEURONTIN) 300 MG capsule Take 900 mg by mouth at bedtime.   3  . Insulin Glargine (TOUJEO SOLOSTAR) 300 UNIT/ML SOPN Inject 100 Units into the skin at bedtime.     Marland Kitchen lanthanum (FOSRENOL) 1000 MG chewable tablet Chew 1,000 mg by mouth 3 (three) times daily with meals.     Marland Kitchen  levothyroxine (SYNTHROID, LEVOTHROID) 125 MCG tablet Take 125 mcg by mouth at bedtime. Reported on 03/01/2016  3  . Melatonin 10 MG TABS Take 10 mg by mouth at bedtime.    . polyethylene glycol (MIRALAX / GLYCOLAX) packet Take 17 g by mouth daily as needed (constipation).     . polyvinyl alcohol (LIQUIFILM TEARS) 1.4 % ophthalmic solution Place 1-2 drops into both eyes as needed (for dry eyes).    . Probiotic Product (PHILLIPS COLON HEALTH PO) Take 1 capsule by mouth daily as needed (constipation).    . ranitidine (ZANTAC) 150 MG tablet Take 150 mg by mouth daily as needed for heartburn.    Marland Kitchen RENVELA 800 MG tablet Take 3,200 mg by mouth 3 (three) times daily with meals. Take 3200 mg also along with snacks  11  . SENSIPAR 60 MG tablet Take 60 mg by mouth at bedtime. Reported on 08/29/2015  11  . silver sulfADIAZINE (SILVADENE) 1 % cream Apply 1 application topically daily.    . temazepam (RESTORIL) 30 MG capsule Take 30 mg by mouth at bedtime.   0  . cyclobenzaprine (FLEXERIL) 10 MG tablet Take 10 mg by mouth 3 (three) times daily as needed for muscle spasms. Reported on 03/01/2016    . DULoxetine (CYMBALTA) 60 MG capsule Take 60 mg by mouth at bedtime. Reported on 03/01/2016  3  . levofloxacin (LEVAQUIN) 500 MG tablet Take 1 tablet (500 mg total) by mouth daily. (Patient not taking: Reported on 03/01/2016) 10 tablet 0   No current facility-administered medications for this visit.    Family History  Problem Relation Age of Onset  . Heart disease Father     Heart Disease before age 6    Social History   Social History  . Marital Status: Married    Spouse Name: N/A  . Number of Children: N/A  . Years of Education: N/A   Occupational History  . Not on file.   Social History Main Topics  . Smoking status: Former Smoker -- 10 years    Types: Cigars    Quit date: 03/13/2012  . Smokeless tobacco: Never Used  . Alcohol Use: No  . Drug Use: No  . Sexual Activity: No   Other Topics  Concern  . Not on file   Social History Narrative     REVIEW OF SYSTEMS:   [X]  denotes positive finding, [ ]  denotes negative finding Cardiac  Comments:  Chest pain or chest pressure:    Shortness of breath upon exertion: x   Short of breath when lying flat: x   Irregular heart rhythm:  Vascular    Pain in calf, thigh, or hip brought on by ambulation: x   Pain in feet at night that wakes you up from your sleep:  x   Blood clot in your veins: x   Leg swelling:  x       Pulmonary    Oxygen at home:    Productive cough:     Wheezing:  x       Neurologic    Sudden weakness in arms or legs:  x   Sudden numbness in arms or legs:  x   Sudden onset of difficulty speaking or slurred speech:    Temporary loss of vision in one eye:     Problems with dizziness:         Gastrointestinal    Blood in stool:     Vomited blood:         Genitourinary    Burning when urinating:     Blood in urine:        Psychiatric    Major depression:  x       Hematologic    Bleeding problems:    Problems with blood clotting too easily:        Skin    Rashes or ulcers: x       Constitutional    Fever or chills:      PHYSICAL EXAMINATION:  Filed Vitals:   03/01/16 1401 03/01/16 1406  BP: 160/88 153/94  Pulse: 103 103  Temp: 98 F (36.7 C)   Resp: 18    Body mass index is 31.78 kg/(m^2).  General:  WDWN in NAD; vital signs documented above Gait: Not observed HENT: WNL, normocephalic Pulmonary: normal non-labored breathing , without Rales, rhonchi,  wheezing Cardiac: regular HR, without  Murmurs, rubs or gallops; without carotid bruits Abdomen: soft, NT, no masses Skin: without rashes Vascular Exam/Pulses:  Right Left  Radial 2+ (normal) 2+ (normal)   Extremities: without ischemic changes, without Gangrene , without cellulitis; without open wounds; + thrill within the fistula.  Large pseudoaneurysm proximal fistula.  Smaller pseudoaneurysm distal fistula (just proximal  to anastomosis) Musculoskeletal: no muscle wasting or atrophy  Neurologic: A&O X 3;  No focal weakness or paresthesias are detected Psychiatric:  The pt has Normal affect.   Non-Invasive Vascular Imaging:   None today  Pt meds includes: Statin:  Yes.   Beta Blocker:  No. Aspirin:  No. ACEI:  No. ARB:  No. Other Antiplatelet/Anticoagulant:  No.    ASSESSMENT/PLAN:: 65 y.o. 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  Dr. Bridgett Larsson discussed this with the pt and his family member.  Would not repair the smaller distal PSA at this time.  Will let this first repair heal and if the distal PSA enlarges, can repair it at a later time.   -the pt is not on any blood thinners.  -will schedule this for 03/06/16.  Pt is willing to proceed.    Leontine Locket, PA-C [facility] [phone]  Clinic MD:  Pt seen and examined in conjunction with Dr. Bridgett Larsson  Addendum  I have independently interviewed and examined the patient, and I agree with the physician assistant's findings.  Pt has two PSA on the L BC AVF.  The more proximal one has attenuated skin concerning for imminent rupture.  The more distal one is moderate in  size without obviously compromised skin.  I would proceed with plication of the more proximal PSA first.  He is scheduled for 25 JUL 17.   Risk, benefits, and alternatives to access surgery were discussed.    The patient is aware the risks include but are not limited to: bleeding, infection, steal syndrome, nerve damage, ischemic monomelic neuropathy, failure to mature, need for additional procedures, death and stroke.    The patient is aware to that this procedure can lead to thrombosis of the fistula.  The patient agrees to proceed forward with the procedure.   Adele Barthel, MD [facility] [place] Office: [phone] Pager:  [phone]  03/01/2016, 2:50 PM

## 2016-03-02 DIAGNOSIS — E1129 Type 2 diabetes mellitus with other diabetic kidney complication: Secondary | ICD-10-CM | POA: Diagnosis not present

## 2016-03-02 DIAGNOSIS — D509 Iron deficiency anemia, unspecified: Secondary | ICD-10-CM | POA: Diagnosis not present

## 2016-03-02 DIAGNOSIS — N2581 Secondary hyperparathyroidism of renal origin: Secondary | ICD-10-CM | POA: Diagnosis not present

## 2016-03-02 DIAGNOSIS — Z23 Encounter for immunization: Secondary | ICD-10-CM | POA: Diagnosis not present

## 2016-03-02 DIAGNOSIS — N186 End stage renal disease: Secondary | ICD-10-CM | POA: Diagnosis not present

## 2016-03-02 DIAGNOSIS — D631 Anemia in chronic kidney disease: Secondary | ICD-10-CM | POA: Diagnosis not present

## 2016-03-03 ENCOUNTER — Encounter (HOSPITAL_COMMUNITY): Payer: Self-pay | Admitting: *Deleted

## 2016-03-03 NOTE — Progress Notes (Signed)
Patient denies chest pain, shortness of breath, cardiac test other than what was done pretransplant evaluation at [facility]. Test noted in Care Everywhere. Reports CBG in am have been in the 70's. Patient stated he has been taking 70 of Toujeo at night. Instructed patient to take 35 units per Anesthesia Adjustment Guidelines. Told to take 4 oz of Apple or Cranberry juice only if CBG < 70 am of surgery. Patient verbalized understanding.

## 2016-03-05 DIAGNOSIS — D631 Anemia in chronic kidney disease: Secondary | ICD-10-CM | POA: Diagnosis not present

## 2016-03-05 DIAGNOSIS — D509 Iron deficiency anemia, unspecified: Secondary | ICD-10-CM | POA: Diagnosis not present

## 2016-03-05 DIAGNOSIS — N186 End stage renal disease: Secondary | ICD-10-CM | POA: Diagnosis not present

## 2016-03-05 DIAGNOSIS — N2581 Secondary hyperparathyroidism of renal origin: Secondary | ICD-10-CM | POA: Diagnosis not present

## 2016-03-05 DIAGNOSIS — E1129 Type 2 diabetes mellitus with other diabetic kidney complication: Secondary | ICD-10-CM | POA: Diagnosis not present

## 2016-03-05 DIAGNOSIS — Z23 Encounter for immunization: Secondary | ICD-10-CM | POA: Diagnosis not present

## 2016-03-05 MED ORDER — DEXTROSE 5 % IV SOLN
1.5000 g | INTRAVENOUS | Status: AC
  Administered 2016-03-06: 1.5 g via INTRAVENOUS
  Filled 2016-03-05: qty 1.5

## 2016-03-05 MED FILL — LEVOTHYROXINE 125 MCG TAB: 125 | 90 days supply | Qty: 90 | Fill #1

## 2016-03-05 NOTE — Anesthesia Preprocedure Evaluation (Addendum)
Anesthesia Evaluation  Patient identified by MRN, date of birth, ID band Patient awake    Reviewed: Allergy & Precautions, H&P , NPO status , Patient's Chart, lab work & pertinent test results  Airway Mallampati: II  TM Distance: >3 FB Neck ROM: Full    Dental no notable dental hx. (+) Edentulous Upper, Edentulous Lower, Dental Advisory Given   Pulmonary COPD,  COPD inhaler, former smoker,    Pulmonary exam normal breath sounds clear to auscultation       Cardiovascular hypertension, Pt. on medications + Peripheral Vascular Disease   Rhythm:Regular Rate:Normal     Neuro/Psych  Headaches, Anxiety Depression    GI/Hepatic Neg liver ROS, GERD  Medicated and Controlled,  Endo/Other  diabetes, Type 1, Insulin Dependent  Renal/GU ESRF and DialysisRenal disease  negative genitourinary   Musculoskeletal  (+) Arthritis , Osteoarthritis,    Abdominal   Peds  Hematology negative hematology ROS (+) anemia ,   Anesthesia Other Findings   Reproductive/Obstetrics negative OB ROS                            Anesthesia Physical Anesthesia Plan  ASA: III  Anesthesia Plan: General   Post-op Pain Management:    Induction: Intravenous  Airway Management Planned: LMA and Oral ETT  Additional Equipment:   Intra-op Plan:   Post-operative Plan: Extubation in OR  Informed Consent: I have reviewed the patients History and Physical, chart, labs and discussed the procedure including the risks, benefits and alternatives for the proposed anesthesia with the patient or authorized representative who has indicated his/her understanding and acceptance.   Dental advisory given  Plan Discussed with: CRNA  Anesthesia Plan Comments:         Anesthesia Quick Evaluation

## 2016-03-06 ENCOUNTER — Encounter (HOSPITAL_COMMUNITY): Payer: Self-pay | Admitting: *Deleted

## 2016-03-06 ENCOUNTER — Ambulatory Visit (HOSPITAL_COMMUNITY): Payer: Medicare Other | Admitting: Anesthesiology

## 2016-03-06 ENCOUNTER — Ambulatory Visit (HOSPITAL_COMMUNITY)
Admission: RE | Admit: 2016-03-06 | Discharge: 2016-03-06 | Disposition: A | Discharge status: Home / Self Care | Payer: Medicare Other | Source: Ambulatory Visit | Attending: Vascular Surgery | Admitting: Vascular Surgery

## 2016-03-06 ENCOUNTER — Encounter (HOSPITAL_COMMUNITY)
Admission: RE | Disposition: A | Discharge status: Home / Self Care | Payer: Self-pay | Source: Ambulatory Visit | Attending: Vascular Surgery

## 2016-03-06 ENCOUNTER — Telehealth: Payer: Self-pay | Admitting: Vascular Surgery

## 2016-03-06 DIAGNOSIS — J449 Chronic obstructive pulmonary disease, unspecified: Secondary | ICD-10-CM | POA: Diagnosis not present

## 2016-03-06 DIAGNOSIS — Z87891 Personal history of nicotine dependence: Secondary | ICD-10-CM | POA: Diagnosis not present

## 2016-03-06 DIAGNOSIS — I120 Hypertensive chronic kidney disease with stage 5 chronic kidney disease or end stage renal disease: Secondary | ICD-10-CM | POA: Diagnosis not present

## 2016-03-06 DIAGNOSIS — M1990 Unspecified osteoarthritis, unspecified site: Secondary | ICD-10-CM | POA: Insufficient documentation

## 2016-03-06 DIAGNOSIS — Y832 Surgical operation with anastomosis, bypass or graft as the cause of abnormal reaction of the patient, or of later complication, without mention of misadventure at the time of the procedure: Secondary | ICD-10-CM | POA: Insufficient documentation

## 2016-03-06 DIAGNOSIS — T82898A Other specified complication of vascular prosthetic devices, implants and grafts, initial encounter: Secondary | ICD-10-CM | POA: Diagnosis not present

## 2016-03-06 DIAGNOSIS — K219 Gastro-esophageal reflux disease without esophagitis: Secondary | ICD-10-CM | POA: Diagnosis not present

## 2016-03-06 DIAGNOSIS — Z794 Long term (current) use of insulin: Secondary | ICD-10-CM | POA: Insufficient documentation

## 2016-03-06 DIAGNOSIS — I770 Arteriovenous fistula, acquired: Secondary | ICD-10-CM | POA: Diagnosis not present

## 2016-03-06 DIAGNOSIS — N186 End stage renal disease: Secondary | ICD-10-CM | POA: Insufficient documentation

## 2016-03-06 HISTORY — DX: Polyneuropathy, unspecified: G62.9

## 2016-03-06 HISTORY — PX: REVISON OF ARTERIOVENOUS FISTULA: SHX6074

## 2016-03-06 HISTORY — DX: Type 2 diabetes mellitus without complications: E11.9

## 2016-03-06 LAB — POCT I-STAT 4, (NA,K, GLUC, HGB,HCT)
Glucose, Bld: 100 mg/dL — ABNORMAL HIGH (ref 65–99)
HCT: 33 % — ABNORMAL LOW (ref 39.0–52.0)
HEMOGLOBIN: 11.2 g/dL — AB (ref 13.0–17.0)
Potassium: 4 mmol/L (ref 3.5–5.1)
Sodium: 139 mmol/L (ref 135–145)

## 2016-03-06 LAB — GLUCOSE, CAPILLARY
GLUCOSE-CAPILLARY: 110 mg/dL — AB (ref 65–99)
GLUCOSE-CAPILLARY: 108 mg/dL — AB (ref 65–99)

## 2016-03-06 SURGERY — REVISON OF ARTERIOVENOUS FISTULA
Anesthesia: General | Site: Arm Upper | Laterality: Left

## 2016-03-06 MED ORDER — FENTANYL CITRATE (PF) 100 MCG/2ML IJ SOLN
INTRAMUSCULAR | Status: DC | PRN
  Administered 2016-03-06: 100 ug via INTRAVENOUS

## 2016-03-06 MED ORDER — EPHEDRINE SULFATE 50 MG/ML IJ SOLN
INTRAMUSCULAR | Status: DC | PRN
  Administered 2016-03-06: 10 mg via INTRAVENOUS

## 2016-03-06 MED ORDER — HEPARIN SODIUM (PORCINE) 1000 UNIT/ML IJ SOLN
INTRAMUSCULAR | Status: AC
  Filled 2016-03-06: qty 1

## 2016-03-06 MED ORDER — MIDAZOLAM HCL 5 MG/5ML IJ SOLN
INTRAMUSCULAR | Status: DC | PRN
Start: 2016-03-06 — End: 2016-03-06
  Administered 2016-03-06: 2 mg via INTRAVENOUS

## 2016-03-06 MED ORDER — HYDROMORPHONE HCL 1 MG/ML IJ SOLN
0.2500 mg | INTRAMUSCULAR | Status: DC | PRN
  Administered 2016-03-06: 0.5 mg via INTRAVENOUS

## 2016-03-06 MED ORDER — ONDANSETRON HCL 4 MG/2ML IJ SOLN
INTRAMUSCULAR | Status: DC | PRN
  Administered 2016-03-06: 4 mg via INTRAVENOUS

## 2016-03-06 MED ORDER — PROPOFOL 10 MG/ML IV BOLUS
INTRAVENOUS | Status: AC
  Filled 2016-03-06: qty 40

## 2016-03-06 MED ORDER — PROTAMINE SULFATE 10 MG/ML IV SOLN
INTRAVENOUS | Status: AC
Start: 2016-03-06 — End: 2016-03-06
  Filled 2016-03-06: qty 5

## 2016-03-06 MED ORDER — ARTIFICIAL TEARS OP OINT
TOPICAL_OINTMENT | OPHTHALMIC | Status: AC
  Filled 2016-03-06: qty 3.5

## 2016-03-06 MED ORDER — MIDAZOLAM HCL 2 MG/2ML IJ SOLN
INTRAMUSCULAR | Status: AC
  Filled 2016-03-06: qty 2

## 2016-03-06 MED ORDER — 0.9 % SODIUM CHLORIDE (POUR BTL) OPTIME
TOPICAL | Status: DC | PRN
  Administered 2016-03-06: 1000 mL

## 2016-03-06 MED ORDER — HEPARIN SODIUM (PORCINE) 1000 UNIT/ML IJ SOLN
INTRAMUSCULAR | Status: DC | PRN
  Administered 2016-03-06: 10000 [IU] via INTRAVENOUS

## 2016-03-06 MED ORDER — SODIUM CHLORIDE 0.9 % IV SOLN
INTRAVENOUS | Status: DC | PRN
  Administered 2016-03-06: 500 mL

## 2016-03-06 MED ORDER — CHLORHEXIDINE GLUCONATE CLOTH 2 % EX PADS: 6.0000 | MEDICATED_PAD | Freq: Once | CUTANEOUS | Status: DC

## 2016-03-06 MED ORDER — PHENYLEPHRINE HCL 10 MG/ML IJ SOLN
INTRAVENOUS | Status: DC | PRN
  Administered 2016-03-06: 75 ug/min via INTRAVENOUS

## 2016-03-06 MED ORDER — PROPOFOL 10 MG/ML IV BOLUS
INTRAVENOUS | Status: DC | PRN
  Administered 2016-03-06: 140 mg via INTRAVENOUS

## 2016-03-06 MED ORDER — OXYCODONE-ACETAMINOPHEN 5-325 MG PO TABS: 1.0000 | ORAL_TABLET | Freq: Four times a day (QID) | ORAL | 0 refills | Status: DC | PRN

## 2016-03-06 MED ORDER — FENTANYL CITRATE (PF) 250 MCG/5ML IJ SOLN
INTRAMUSCULAR | Status: AC
  Filled 2016-03-06: qty 5

## 2016-03-06 MED ORDER — SUCCINYLCHOLINE CHLORIDE 20 MG/ML IJ SOLN
INTRAMUSCULAR | Status: DC | PRN
  Administered 2016-03-06: 100 mg via INTRAVENOUS

## 2016-03-06 MED ORDER — SODIUM CHLORIDE 0.9 % IV SOLN
INTRAVENOUS | Status: DC
Start: 2016-03-06 — End: 2016-03-06
  Administered 2016-03-06 (×2): via INTRAVENOUS

## 2016-03-06 MED ORDER — PROTAMINE SULFATE 10 MG/ML IV SOLN
INTRAVENOUS | Status: AC
  Filled 2016-03-06: qty 5

## 2016-03-06 MED ORDER — PROTAMINE SULFATE 10 MG/ML IV SOLN
INTRAVENOUS | Status: DC | PRN
  Administered 2016-03-06: 70 mg via INTRAVENOUS

## 2016-03-06 MED ORDER — DEXAMETHASONE SODIUM PHOSPHATE 4 MG/ML IJ SOLN
INTRAMUSCULAR | Status: DC | PRN
  Administered 2016-03-06: 4 mg via INTRAVENOUS

## 2016-03-06 MED ORDER — HYDROMORPHONE HCL 1 MG/ML IJ SOLN
INTRAMUSCULAR | Status: DC
Start: 2016-03-06 — End: 2016-03-06
  Filled 2016-03-06: qty 1

## 2016-03-06 MED ORDER — LIDOCAINE HCL (PF) 1 % IJ SOLN
INTRAMUSCULAR | Status: AC
  Filled 2016-03-06: qty 30

## 2016-03-06 MED ORDER — PHENYLEPHRINE HCL 10 MG/ML IJ SOLN
INTRAMUSCULAR | Status: DC | PRN
  Administered 2016-03-06 (×2): 80 ug via INTRAVENOUS

## 2016-03-06 MED ORDER — LIDOCAINE 2% (20 MG/ML) 5 ML SYRINGE
INTRAMUSCULAR | Status: DC | PRN
  Administered 2016-03-06: 60 mg via INTRAVENOUS

## 2016-03-06 MED FILL — OXYCODONE/APAP 5-325: 5-325 | 2 days supply | Qty: 6 | Fill #0

## 2016-03-06 SURGICAL SUPPLY — 39 items
BNDG ESMARK 4X9 LF (GAUZE/BANDAGES/DRESSINGS) ×2 IMPLANT
CANISTER SUCTION 2500CC (MISCELLANEOUS) ×2 IMPLANT
CLIP TI MEDIUM 6 (CLIP) ×2 IMPLANT
CLIP TI WIDE RED SMALL 6 (CLIP) ×2 IMPLANT
COVER PROBE W GEL 5X96 (DRAPES) IMPLANT
CUFF TOURNIQUET SINGLE 18IN (TOURNIQUET CUFF) ×2 IMPLANT
DECANTER SPIKE VIAL GLASS SM (MISCELLANEOUS) IMPLANT
DRAIN PENROSE 1/2X12 LTX STRL (WOUND CARE) IMPLANT
ELECT REM PT RETURN 9FT ADLT (ELECTROSURGICAL) ×2
ELECTRODE REM PT RTRN 9FT ADLT (ELECTROSURGICAL) ×1 IMPLANT
GLOVE BIO SURGEON STRL SZ 6.5 (GLOVE) ×2 IMPLANT
GLOVE BIO SURGEON STRL SZ7 (GLOVE) ×2 IMPLANT
GLOVE BIOGEL PI IND STRL 6.5 (GLOVE) ×3 IMPLANT
GLOVE BIOGEL PI IND STRL 7.0 (GLOVE) ×1 IMPLANT
GLOVE BIOGEL PI IND STRL 7.5 (GLOVE) ×1 IMPLANT
GLOVE BIOGEL PI INDICATOR 6.5 (GLOVE) ×3
GLOVE BIOGEL PI INDICATOR 7.0 (GLOVE) ×1
GLOVE BIOGEL PI INDICATOR 7.5 (GLOVE) ×1
GLOVE SURG SS PI 6.5 STRL IVOR (GLOVE) ×4 IMPLANT
GOWN STRL REUS W/ TWL LRG LVL3 (GOWN DISPOSABLE) ×3 IMPLANT
GOWN STRL REUS W/TWL LRG LVL3 (GOWN DISPOSABLE) ×3
KIT BASIN OR (CUSTOM PROCEDURE TRAY) ×2 IMPLANT
KIT ROOM TURNOVER OR (KITS) ×2 IMPLANT
LIQUID BAND (GAUZE/BANDAGES/DRESSINGS) ×2 IMPLANT
NS IRRIG 1000ML POUR BTL (IV SOLUTION) ×2 IMPLANT
PACK CV ACCESS (CUSTOM PROCEDURE TRAY) ×2 IMPLANT
PAD ARMBOARD 7.5X6 YLW CONV (MISCELLANEOUS) ×4 IMPLANT
SPONGE GAUZE 4X4 12PLY STER LF (GAUZE/BANDAGES/DRESSINGS) ×2 IMPLANT
SPONGE SURGIFOAM ABS GEL 100 (HEMOSTASIS) IMPLANT
STAPLER VISISTAT 35W (STAPLE) ×2 IMPLANT
SUT MNCRL AB 4-0 PS2 18 (SUTURE) ×2 IMPLANT
SUT PROLENE 5 0 C 1 24 (SUTURE) ×4 IMPLANT
SUT PROLENE 6 0 BV (SUTURE) ×2 IMPLANT
SUT PROLENE 7 0 BV 1 (SUTURE) IMPLANT
SUT VIC AB 3-0 SH 27 (SUTURE) ×1
SUT VIC AB 3-0 SH 27X BRD (SUTURE) ×1 IMPLANT
TAPE CLOTH SURG 4X10 WHT LF (GAUZE/BANDAGES/DRESSINGS) ×2 IMPLANT
UNDERPAD 30X30 INCONTINENT (UNDERPADS AND DIAPERS) ×2 IMPLANT
WATER STERILE IRR 1000ML POUR (IV SOLUTION) ×2 IMPLANT

## 2016-03-06 NOTE — Interval H&P Note (Signed)
History and Physical Interval Note:  03/06/2016 7:21 AM  [name]  has presented today for surgery, with the diagnosis of End Stage Renal Disease N18.6; Left arm brachiocephalic arteriovenous fistula pseudoaneurysm I72.9  The various methods of treatment have been discussed with the patient and family. After consideration of risks, benefits and other options for treatment, the patient has consented to  Procedure(s): PLICATION OF BRACHIOCEPHALIC ARTERIOVENOUS FISTULA (Left) as a surgical intervention .  The patient's history has been reviewed, patient examined, no change in status, stable for surgery.  I have reviewed the patient's chart and labs.  Questions were answered to the patient's satisfaction.     Adele Barthel

## 2016-03-06 NOTE — Op Note (Addendum)
    OPERATIVE NOTE   PROCEDURE: 1. Plication of left brachiocephalic arteriovenous fistula pseudoaneurysm  PRE-OPERATIVE DIAGNOSIS: Pseudoaneurysm degeneration of arteriovenous fistula   POST-OPERATIVE DIAGNOSIS: same as above   SURGEON: Adele Barthel, MD  ASSISTANT(S): Silva Bandy, PAC   ANESTHESIA: general  ESTIMATED BLOOD LOSS: 30 cc  FINDING(S): 1. Thin walled pseudoaneurysm 2. Palpable thrill at end of the case  SPECIMEN(S):  none  INDICATIONS:   [name] is a 65 y.o. male who  presents with pseudoaneurysmal degeneration of left arm arteriovenous fistula.  In order to salvage the fistula and decrease the bleeding complication risks, I recommended plication of the fistula.  Risk, benefits, and alternatives to access surgery were discussed.  The patient is aware the risks include but are not limited to: bleeding, infection, steal syndrome, nerve damage, ischemic monomelic neuropathy, failure to mature, need for additional procedures, death and stroke.  The patient agrees to proceed forward with the procedure.  DESCRIPTION: After obtaining full informed written consent, the patient was brought back to the operating room and placed supine upon the operating table.  The patient received IV antibiotics prior to induction.  After obtaining adequate anesthesia, the patient was prepped and draped in the standard fashion for: left access procedure.  The patient was given 10000 units of Heparin intravenously, which was a therapeutic bolus.  After waiting 5 minutes, a sterile tourniquet was applied to the upper arm and inflated to 250 mm Hg after exsanguinating the arm with an Esmark bandage.  An elliptical incision was made around the skin overlying the pseudoaneurysmal segment in the fistula.  I dissected the skin away from the pseudoaneurysm with electrocautery.  Eventually, I was able to skeletonize the pseudoaneurysm.  I sharply entered into the pseudoaneurysm.  I then sharply tailored  the anterior wall of the pseudoaneurysm.  The residual fistula wall was felt to be adequately strong to repair.  I repaired the fistula with a running stitch of 5-0 Prolene, taking care to take large bites of the fistula wall.  Prior to completing this repair, I pass 5 mm dilator proximally and distally: no resistance was encountered.  The repair was completed in the usual fashion.  At this point, the patient was given 70 mg of Protamine intravenously to reverse the anticoagulation.  Bleeding points were controlled with electrocautery.  I reapproximated the subcutaneous tissue immediately above the fistula with a running stitch of 3-0 Vicryl.  The skin was reapproximated with a running subcuticular of 4-0 Monocryl.  The skin was cleaned, dried, and the skin was dressed with sterile dressing.  The patient continued to have a a palpable thrill in the fistula.   COMPLICATIONS: none  CONDITION: stable  Adele Barthel, MD [facility] [place] Office: [phone] Pager: [phone]  03/06/2016, 8:48 AM

## 2016-03-06 NOTE — Telephone Encounter (Signed)
Sched staple removal to 8/11 at 11:15, BLC 8/25 at 10:15. Lm on hm# to inform pt of appts.

## 2016-03-06 NOTE — Progress Notes (Signed)
Report given to robin roberts rn as caregiver 

## 2016-03-06 NOTE — Telephone Encounter (Signed)
-----   Message from Mena Goes, RN sent at 03/06/2016 10:04 AM EDT ----- Regarding: schedule 2 and 4 week postop   ----- Message ----- From: Alvia Grove, PA-C Sent: 03/06/2016   8:28 AM To: Vvs Charge Pool  S/p plication left upper arm fistula 03/06/16  2 week f/u for staple removal. Nurse visit.  4 week f/u with Dr. Bridgett Larsson.   Thanks Maudie Mercury

## 2016-03-06 NOTE — Anesthesia Procedure Notes (Signed)
Procedure Name: Intubation Date/Time: 03/06/2016 7:36 AM Performed by: Kyung Rudd Pre-anesthesia Checklist: Patient identified, Emergency Drugs available, Suction available, Patient being monitored and Timeout performed Patient Re-evaluated:Patient Re-evaluated prior to inductionOxygen Delivery Method: Circle system utilized Preoxygenation: Pre-oxygenation with 100% oxygen Intubation Type: IV induction Ventilation: Mask ventilation without difficulty and Oral airway inserted - appropriate to patient size Laryngoscope Size: Miller and 3 Grade View: Grade I Tube type: Oral Tube size: 7.5 mm Number of attempts: 1 Airway Equipment and Method: Stylet Placement Confirmation: ETT inserted through vocal cords under direct vision,  positive ETCO2 and breath sounds checked- equal and bilateral Secured at: 22 cm Dental Injury: Teeth and Oropharynx as per pre-operative assessment  Comments: AOI per Brooke Bonito, SRNA with Dr. Ola Spurr supervising.

## 2016-03-06 NOTE — Anesthesia Postprocedure Evaluation (Signed)
Anesthesia Post Note  Patient: [name]  Procedure(s) Performed: Procedure(s) (LRB): PLICATION OF LEFT BRACHIOCEPHALIC ARTERIOVENOUS FISTULA (Left)  Patient location during evaluation: PACU Anesthesia Type: MAC Level of consciousness: awake and alert Pain management: pain level controlled Vital Signs Assessment: post-procedure vital signs reviewed and stable Respiratory status: spontaneous breathing, nonlabored ventilation and respiratory function stable Cardiovascular status: stable and blood pressure returned to baseline Anesthetic complications: no    Last Vitals:  Vitals:   03/06/16 0930 03/06/16 0947  BP: 100/75 (!) 106/56  Pulse: 82 86  Resp: (!) 9 14  Temp:      Last Pain:  Vitals:   03/06/16 0947  PainSc: 4                  Hobert Poplaski,W. EDMOND

## 2016-03-06 NOTE — H&P (View-Only) (Signed)
Filed Vitals:   03/01/16 1401 03/01/16 1406  BP: 160/88 153/94  Pulse: 103 103  Temp: 98 F (36.7 C)   Resp: 18   Height: 6\' 4"  (1.93 m)   Weight: 261 lb (118.389 kg)   SpO2: 96%

## 2016-03-06 NOTE — Transfer of Care (Signed)
Immediate Anesthesia Transfer of Care Note  Patient: [name]  Procedure(s) Performed: Procedure(s): PLICATION OF LEFT BRACHIOCEPHALIC ARTERIOVENOUS FISTULA (Left)  Patient Location: PACU  Anesthesia Type:General  Level of Consciousness: sedated  Airway & Oxygen Therapy: Patient Spontanous Breathing and Patient connected to face mask oxygen  Post-op Assessment: Report given to RN, Post -op Vital signs reviewed and stable and Patient moving all extremities  Post vital signs: Reviewed and stable  Last Vitals:  Vitals:   03/06/16 0602  BP: (!) 142/72  Pulse: 92  Resp: 20  Temp: 36.8 C    Last Pain: There were no vitals filed for this visit.       Complications: No apparent anesthesia complications

## 2016-03-07 ENCOUNTER — Encounter (HOSPITAL_COMMUNITY): Payer: Self-pay | Admitting: Vascular Surgery

## 2016-03-07 DIAGNOSIS — N2581 Secondary hyperparathyroidism of renal origin: Secondary | ICD-10-CM | POA: Diagnosis not present

## 2016-03-07 DIAGNOSIS — Z23 Encounter for immunization: Secondary | ICD-10-CM | POA: Diagnosis not present

## 2016-03-07 DIAGNOSIS — D509 Iron deficiency anemia, unspecified: Secondary | ICD-10-CM | POA: Diagnosis not present

## 2016-03-07 DIAGNOSIS — D631 Anemia in chronic kidney disease: Secondary | ICD-10-CM | POA: Diagnosis not present

## 2016-03-07 DIAGNOSIS — E1129 Type 2 diabetes mellitus with other diabetic kidney complication: Secondary | ICD-10-CM | POA: Diagnosis not present

## 2016-03-07 DIAGNOSIS — N186 End stage renal disease: Secondary | ICD-10-CM | POA: Diagnosis not present

## 2016-03-08 DIAGNOSIS — E1122 Type 2 diabetes mellitus with diabetic chronic kidney disease: Secondary | ICD-10-CM | POA: Diagnosis not present

## 2016-03-08 DIAGNOSIS — E782 Mixed hyperlipidemia: Secondary | ICD-10-CM | POA: Diagnosis not present

## 2016-03-08 DIAGNOSIS — I10 Essential (primary) hypertension: Secondary | ICD-10-CM | POA: Diagnosis not present

## 2016-03-08 DIAGNOSIS — E039 Hypothyroidism, unspecified: Secondary | ICD-10-CM | POA: Diagnosis not present

## 2016-03-08 DIAGNOSIS — K210 Gastro-esophageal reflux disease with esophagitis: Secondary | ICD-10-CM | POA: Diagnosis not present

## 2016-03-09 DIAGNOSIS — D509 Iron deficiency anemia, unspecified: Secondary | ICD-10-CM | POA: Diagnosis not present

## 2016-03-09 DIAGNOSIS — E1129 Type 2 diabetes mellitus with other diabetic kidney complication: Secondary | ICD-10-CM | POA: Diagnosis not present

## 2016-03-09 DIAGNOSIS — N186 End stage renal disease: Secondary | ICD-10-CM | POA: Diagnosis not present

## 2016-03-09 DIAGNOSIS — D631 Anemia in chronic kidney disease: Secondary | ICD-10-CM | POA: Diagnosis not present

## 2016-03-09 DIAGNOSIS — Z23 Encounter for immunization: Secondary | ICD-10-CM | POA: Diagnosis not present

## 2016-03-09 DIAGNOSIS — N2581 Secondary hyperparathyroidism of renal origin: Secondary | ICD-10-CM | POA: Diagnosis not present

## 2016-03-12 DIAGNOSIS — E1129 Type 2 diabetes mellitus with other diabetic kidney complication: Secondary | ICD-10-CM | POA: Diagnosis not present

## 2016-03-12 DIAGNOSIS — Z23 Encounter for immunization: Secondary | ICD-10-CM | POA: Diagnosis not present

## 2016-03-12 DIAGNOSIS — N2581 Secondary hyperparathyroidism of renal origin: Secondary | ICD-10-CM | POA: Diagnosis not present

## 2016-03-12 DIAGNOSIS — Z992 Dependence on renal dialysis: Secondary | ICD-10-CM | POA: Diagnosis not present

## 2016-03-12 DIAGNOSIS — K2100 Gastro-esophageal reflux disease with esophagitis, without bleeding: Secondary | ICD-10-CM | POA: Insufficient documentation

## 2016-03-12 DIAGNOSIS — N186 End stage renal disease: Secondary | ICD-10-CM | POA: Diagnosis not present

## 2016-03-12 DIAGNOSIS — D631 Anemia in chronic kidney disease: Secondary | ICD-10-CM | POA: Diagnosis not present

## 2016-03-12 DIAGNOSIS — D509 Iron deficiency anemia, unspecified: Secondary | ICD-10-CM | POA: Diagnosis not present

## 2016-03-12 MED FILL — DULoxetine HCL 60 MG CPEP: 60 | 90 days supply | Qty: 90 | Fill #0

## 2016-03-13 DIAGNOSIS — I739 Peripheral vascular disease, unspecified: Secondary | ICD-10-CM | POA: Diagnosis not present

## 2016-03-13 DIAGNOSIS — I10 Essential (primary) hypertension: Secondary | ICD-10-CM | POA: Diagnosis not present

## 2016-03-13 DIAGNOSIS — Z992 Dependence on renal dialysis: Secondary | ICD-10-CM | POA: Diagnosis not present

## 2016-03-13 DIAGNOSIS — E782 Mixed hyperlipidemia: Secondary | ICD-10-CM | POA: Diagnosis not present

## 2016-03-13 DIAGNOSIS — E1122 Type 2 diabetes mellitus with diabetic chronic kidney disease: Secondary | ICD-10-CM | POA: Diagnosis not present

## 2016-03-13 DIAGNOSIS — G6289 Other specified polyneuropathies: Secondary | ICD-10-CM | POA: Diagnosis not present

## 2016-03-13 DIAGNOSIS — N185 Chronic kidney disease, stage 5: Secondary | ICD-10-CM | POA: Diagnosis not present

## 2016-03-13 DIAGNOSIS — K219 Gastro-esophageal reflux disease without esophagitis: Secondary | ICD-10-CM | POA: Diagnosis not present

## 2016-03-13 DIAGNOSIS — J449 Chronic obstructive pulmonary disease, unspecified: Secondary | ICD-10-CM | POA: Diagnosis not present

## 2016-03-13 DIAGNOSIS — E1142 Type 2 diabetes mellitus with diabetic polyneuropathy: Secondary | ICD-10-CM | POA: Diagnosis not present

## 2016-03-13 DIAGNOSIS — Z6832 Body mass index (BMI) 32.0-32.9, adult: Secondary | ICD-10-CM | POA: Diagnosis not present

## 2016-03-13 MED FILL — CYCLOBENZAPRINE 10 MG TAB: 10 | 90 days supply | Qty: 270 | Fill #0

## 2016-03-14 DIAGNOSIS — D631 Anemia in chronic kidney disease: Secondary | ICD-10-CM | POA: Diagnosis not present

## 2016-03-14 DIAGNOSIS — N2581 Secondary hyperparathyroidism of renal origin: Secondary | ICD-10-CM | POA: Diagnosis not present

## 2016-03-14 DIAGNOSIS — E1129 Type 2 diabetes mellitus with other diabetic kidney complication: Secondary | ICD-10-CM | POA: Diagnosis not present

## 2016-03-14 DIAGNOSIS — D509 Iron deficiency anemia, unspecified: Secondary | ICD-10-CM | POA: Diagnosis not present

## 2016-03-14 DIAGNOSIS — E8779 Other fluid overload: Secondary | ICD-10-CM | POA: Diagnosis not present

## 2016-03-14 DIAGNOSIS — N186 End stage renal disease: Secondary | ICD-10-CM | POA: Diagnosis not present

## 2016-03-15 ENCOUNTER — Encounter: Payer: Self-pay | Admitting: Vascular Surgery

## 2016-03-16 DIAGNOSIS — D631 Anemia in chronic kidney disease: Secondary | ICD-10-CM | POA: Diagnosis not present

## 2016-03-16 DIAGNOSIS — N186 End stage renal disease: Secondary | ICD-10-CM | POA: Diagnosis not present

## 2016-03-16 DIAGNOSIS — D509 Iron deficiency anemia, unspecified: Secondary | ICD-10-CM | POA: Diagnosis not present

## 2016-03-16 DIAGNOSIS — E8779 Other fluid overload: Secondary | ICD-10-CM | POA: Diagnosis not present

## 2016-03-16 DIAGNOSIS — E1129 Type 2 diabetes mellitus with other diabetic kidney complication: Secondary | ICD-10-CM | POA: Diagnosis not present

## 2016-03-16 DIAGNOSIS — N2581 Secondary hyperparathyroidism of renal origin: Secondary | ICD-10-CM | POA: Diagnosis not present

## 2016-03-17 DIAGNOSIS — N186 End stage renal disease: Secondary | ICD-10-CM | POA: Diagnosis not present

## 2016-03-17 DIAGNOSIS — E668 Other obesity: Secondary | ICD-10-CM | POA: Diagnosis not present

## 2016-03-19 DIAGNOSIS — D509 Iron deficiency anemia, unspecified: Secondary | ICD-10-CM | POA: Diagnosis not present

## 2016-03-19 DIAGNOSIS — D631 Anemia in chronic kidney disease: Secondary | ICD-10-CM | POA: Diagnosis not present

## 2016-03-19 DIAGNOSIS — N2581 Secondary hyperparathyroidism of renal origin: Secondary | ICD-10-CM | POA: Diagnosis not present

## 2016-03-19 DIAGNOSIS — E1129 Type 2 diabetes mellitus with other diabetic kidney complication: Secondary | ICD-10-CM | POA: Diagnosis not present

## 2016-03-19 DIAGNOSIS — E8779 Other fluid overload: Secondary | ICD-10-CM | POA: Diagnosis not present

## 2016-03-19 DIAGNOSIS — N186 End stage renal disease: Secondary | ICD-10-CM | POA: Diagnosis not present

## 2016-03-21 DIAGNOSIS — E1129 Type 2 diabetes mellitus with other diabetic kidney complication: Secondary | ICD-10-CM | POA: Diagnosis not present

## 2016-03-21 DIAGNOSIS — N186 End stage renal disease: Secondary | ICD-10-CM | POA: Diagnosis not present

## 2016-03-21 DIAGNOSIS — E8779 Other fluid overload: Secondary | ICD-10-CM | POA: Diagnosis not present

## 2016-03-21 DIAGNOSIS — D631 Anemia in chronic kidney disease: Secondary | ICD-10-CM | POA: Diagnosis not present

## 2016-03-21 DIAGNOSIS — N2581 Secondary hyperparathyroidism of renal origin: Secondary | ICD-10-CM | POA: Diagnosis not present

## 2016-03-21 DIAGNOSIS — D509 Iron deficiency anemia, unspecified: Secondary | ICD-10-CM | POA: Diagnosis not present

## 2016-03-23 DIAGNOSIS — S80211D Abrasion, right knee, subsequent encounter: Secondary | ICD-10-CM | POA: Diagnosis not present

## 2016-03-23 DIAGNOSIS — R740 Nonspecific elevation of levels of transaminase and lactic acid dehydrogenase [LDH]: Secondary | ICD-10-CM | POA: Diagnosis not present

## 2016-03-23 DIAGNOSIS — F339 Major depressive disorder, recurrent, unspecified: Secondary | ICD-10-CM | POA: Diagnosis not present

## 2016-03-23 DIAGNOSIS — F329 Major depressive disorder, single episode, unspecified: Secondary | ICD-10-CM | POA: Diagnosis present

## 2016-03-23 DIAGNOSIS — Z7401 Bed confinement status: Secondary | ICD-10-CM | POA: Diagnosis not present

## 2016-03-23 DIAGNOSIS — E160 Drug-induced hypoglycemia without coma: Secondary | ICD-10-CM | POA: Diagnosis not present

## 2016-03-23 DIAGNOSIS — F411 Generalized anxiety disorder: Secondary | ICD-10-CM | POA: Diagnosis not present

## 2016-03-23 DIAGNOSIS — R531 Weakness: Secondary | ICD-10-CM | POA: Diagnosis not present

## 2016-03-23 DIAGNOSIS — R413 Other amnesia: Secondary | ICD-10-CM | POA: Diagnosis not present

## 2016-03-23 DIAGNOSIS — R4781 Slurred speech: Secondary | ICD-10-CM | POA: Diagnosis not present

## 2016-03-23 DIAGNOSIS — S80211A Abrasion, right knee, initial encounter: Secondary | ICD-10-CM | POA: Diagnosis not present

## 2016-03-23 DIAGNOSIS — K219 Gastro-esophageal reflux disease without esophagitis: Secondary | ICD-10-CM | POA: Diagnosis present

## 2016-03-23 DIAGNOSIS — J15211 Pneumonia due to Methicillin susceptible Staphylococcus aureus: Secondary | ICD-10-CM | POA: Diagnosis not present

## 2016-03-23 DIAGNOSIS — R7989 Other specified abnormal findings of blood chemistry: Secondary | ICD-10-CM | POA: Diagnosis not present

## 2016-03-23 DIAGNOSIS — R279 Unspecified lack of coordination: Secondary | ICD-10-CM | POA: Diagnosis not present

## 2016-03-23 DIAGNOSIS — I120 Hypertensive chronic kidney disease with stage 5 chronic kidney disease or end stage renal disease: Secondary | ICD-10-CM | POA: Diagnosis present

## 2016-03-23 DIAGNOSIS — N250 Renal osteodystrophy: Secondary | ICD-10-CM | POA: Diagnosis not present

## 2016-03-23 DIAGNOSIS — R278 Other lack of coordination: Secondary | ICD-10-CM | POA: Diagnosis not present

## 2016-03-23 DIAGNOSIS — N186 End stage renal disease: Secondary | ICD-10-CM | POA: Diagnosis present

## 2016-03-23 DIAGNOSIS — Z992 Dependence on renal dialysis: Secondary | ICD-10-CM | POA: Diagnosis not present

## 2016-03-23 DIAGNOSIS — R410 Disorientation, unspecified: Secondary | ICD-10-CM | POA: Diagnosis not present

## 2016-03-23 DIAGNOSIS — M898X9 Other specified disorders of bone, unspecified site: Secondary | ICD-10-CM | POA: Diagnosis present

## 2016-03-23 DIAGNOSIS — Z794 Long term (current) use of insulin: Secondary | ICD-10-CM | POA: Diagnosis not present

## 2016-03-23 DIAGNOSIS — E878 Other disorders of electrolyte and fluid balance, not elsewhere classified: Secondary | ICD-10-CM | POA: Diagnosis not present

## 2016-03-23 DIAGNOSIS — Z4682 Encounter for fitting and adjustment of non-vascular catheter: Secondary | ICD-10-CM | POA: Diagnosis not present

## 2016-03-23 DIAGNOSIS — F419 Anxiety disorder, unspecified: Secondary | ICD-10-CM | POA: Diagnosis present

## 2016-03-23 DIAGNOSIS — E1140 Type 2 diabetes mellitus with diabetic neuropathy, unspecified: Secondary | ICD-10-CM | POA: Diagnosis not present

## 2016-03-23 DIAGNOSIS — I63512 Cerebral infarction due to unspecified occlusion or stenosis of left middle cerebral artery: Secondary | ICD-10-CM | POA: Diagnosis not present

## 2016-03-23 DIAGNOSIS — K5909 Other constipation: Secondary | ICD-10-CM | POA: Diagnosis present

## 2016-03-23 DIAGNOSIS — R2689 Other abnormalities of gait and mobility: Secondary | ICD-10-CM | POA: Diagnosis not present

## 2016-03-23 DIAGNOSIS — W19XXXA Unspecified fall, initial encounter: Secondary | ICD-10-CM | POA: Diagnosis not present

## 2016-03-23 DIAGNOSIS — E1142 Type 2 diabetes mellitus with diabetic polyneuropathy: Secondary | ICD-10-CM | POA: Diagnosis present

## 2016-03-23 DIAGNOSIS — E875 Hyperkalemia: Secondary | ICD-10-CM | POA: Diagnosis not present

## 2016-03-23 DIAGNOSIS — R404 Transient alteration of awareness: Secondary | ICD-10-CM | POA: Diagnosis not present

## 2016-03-23 DIAGNOSIS — N189 Chronic kidney disease, unspecified: Secondary | ICD-10-CM | POA: Diagnosis not present

## 2016-03-23 DIAGNOSIS — E039 Hypothyroidism, unspecified: Secondary | ICD-10-CM | POA: Diagnosis present

## 2016-03-23 DIAGNOSIS — R092 Respiratory arrest: Secondary | ICD-10-CM | POA: Diagnosis not present

## 2016-03-23 DIAGNOSIS — E11649 Type 2 diabetes mellitus with hypoglycemia without coma: Secondary | ICD-10-CM | POA: Diagnosis not present

## 2016-03-23 DIAGNOSIS — M545 Low back pain: Secondary | ICD-10-CM | POA: Diagnosis not present

## 2016-03-23 DIAGNOSIS — J9811 Atelectasis: Secondary | ICD-10-CM | POA: Diagnosis not present

## 2016-03-23 DIAGNOSIS — T1490 Injury, unspecified: Secondary | ICD-10-CM | POA: Diagnosis not present

## 2016-03-23 DIAGNOSIS — D631 Anemia in chronic kidney disease: Secondary | ICD-10-CM | POA: Diagnosis present

## 2016-03-23 DIAGNOSIS — R4189 Other symptoms and signs involving cognitive functions and awareness: Secondary | ICD-10-CM | POA: Diagnosis not present

## 2016-03-23 DIAGNOSIS — J690 Pneumonitis due to inhalation of food and vomit: Secondary | ICD-10-CM | POA: Diagnosis not present

## 2016-03-23 DIAGNOSIS — I10 Essential (primary) hypertension: Secondary | ICD-10-CM | POA: Diagnosis not present

## 2016-03-23 DIAGNOSIS — E785 Hyperlipidemia, unspecified: Secondary | ICD-10-CM | POA: Diagnosis present

## 2016-03-23 DIAGNOSIS — M6281 Muscle weakness (generalized): Secondary | ICD-10-CM | POA: Diagnosis not present

## 2016-03-23 DIAGNOSIS — G919 Hydrocephalus, unspecified: Secondary | ICD-10-CM | POA: Diagnosis not present

## 2016-03-23 DIAGNOSIS — S80212D Abrasion, left knee, subsequent encounter: Secondary | ICD-10-CM | POA: Diagnosis not present

## 2016-03-23 DIAGNOSIS — E1129 Type 2 diabetes mellitus with other diabetic kidney complication: Secondary | ICD-10-CM | POA: Diagnosis not present

## 2016-03-23 DIAGNOSIS — E1122 Type 2 diabetes mellitus with diabetic chronic kidney disease: Secondary | ICD-10-CM | POA: Diagnosis present

## 2016-03-23 DIAGNOSIS — R1312 Dysphagia, oropharyngeal phase: Secondary | ICD-10-CM | POA: Diagnosis not present

## 2016-03-23 DIAGNOSIS — F5104 Psychophysiologic insomnia: Secondary | ICD-10-CM | POA: Diagnosis not present

## 2016-03-23 DIAGNOSIS — N185 Chronic kidney disease, stage 5: Secondary | ICD-10-CM | POA: Diagnosis not present

## 2016-03-23 DIAGNOSIS — E161 Other hypoglycemia: Secondary | ICD-10-CM | POA: Diagnosis not present

## 2016-03-23 DIAGNOSIS — G918 Other hydrocephalus: Secondary | ICD-10-CM | POA: Diagnosis not present

## 2016-04-02 DIAGNOSIS — Z23 Encounter for immunization: Secondary | ICD-10-CM | POA: Diagnosis not present

## 2016-04-02 DIAGNOSIS — G919 Hydrocephalus, unspecified: Secondary | ICD-10-CM | POA: Diagnosis not present

## 2016-04-02 DIAGNOSIS — R279 Unspecified lack of coordination: Secondary | ICD-10-CM | POA: Diagnosis not present

## 2016-04-02 DIAGNOSIS — I272 Other secondary pulmonary hypertension: Secondary | ICD-10-CM | POA: Diagnosis not present

## 2016-04-02 DIAGNOSIS — N186 End stage renal disease: Secondary | ICD-10-CM | POA: Diagnosis not present

## 2016-04-02 DIAGNOSIS — Z7401 Bed confinement status: Secondary | ICD-10-CM | POA: Diagnosis not present

## 2016-04-02 DIAGNOSIS — E1129 Type 2 diabetes mellitus with other diabetic kidney complication: Secondary | ICD-10-CM | POA: Diagnosis not present

## 2016-04-02 DIAGNOSIS — R2689 Other abnormalities of gait and mobility: Secondary | ICD-10-CM | POA: Diagnosis not present

## 2016-04-02 DIAGNOSIS — I071 Rheumatic tricuspid insufficiency: Secondary | ICD-10-CM | POA: Diagnosis not present

## 2016-04-02 DIAGNOSIS — R4189 Other symptoms and signs involving cognitive functions and awareness: Secondary | ICD-10-CM | POA: Diagnosis not present

## 2016-04-02 DIAGNOSIS — I63512 Cerebral infarction due to unspecified occlusion or stenosis of left middle cerebral artery: Secondary | ICD-10-CM | POA: Diagnosis not present

## 2016-04-02 DIAGNOSIS — E161 Other hypoglycemia: Secondary | ICD-10-CM | POA: Diagnosis not present

## 2016-04-02 DIAGNOSIS — E668 Other obesity: Secondary | ICD-10-CM | POA: Diagnosis not present

## 2016-04-02 DIAGNOSIS — S80212D Abrasion, left knee, subsequent encounter: Secondary | ICD-10-CM | POA: Diagnosis not present

## 2016-04-02 DIAGNOSIS — N185 Chronic kidney disease, stage 5: Secondary | ICD-10-CM | POA: Diagnosis not present

## 2016-04-02 DIAGNOSIS — E160 Drug-induced hypoglycemia without coma: Secondary | ICD-10-CM | POA: Diagnosis not present

## 2016-04-02 DIAGNOSIS — W19XXXA Unspecified fall, initial encounter: Secondary | ICD-10-CM | POA: Diagnosis not present

## 2016-04-02 DIAGNOSIS — S80211A Abrasion, right knee, initial encounter: Secondary | ICD-10-CM | POA: Diagnosis not present

## 2016-04-02 DIAGNOSIS — D509 Iron deficiency anemia, unspecified: Secondary | ICD-10-CM | POA: Diagnosis not present

## 2016-04-02 DIAGNOSIS — E1140 Type 2 diabetes mellitus with diabetic neuropathy, unspecified: Secondary | ICD-10-CM | POA: Diagnosis not present

## 2016-04-02 DIAGNOSIS — R1312 Dysphagia, oropharyngeal phase: Secondary | ICD-10-CM | POA: Diagnosis not present

## 2016-04-02 DIAGNOSIS — N250 Renal osteodystrophy: Secondary | ICD-10-CM | POA: Diagnosis not present

## 2016-04-02 DIAGNOSIS — E039 Hypothyroidism, unspecified: Secondary | ICD-10-CM | POA: Diagnosis not present

## 2016-04-02 DIAGNOSIS — M545 Low back pain: Secondary | ICD-10-CM | POA: Diagnosis not present

## 2016-04-02 DIAGNOSIS — E785 Hyperlipidemia, unspecified: Secondary | ICD-10-CM | POA: Diagnosis not present

## 2016-04-02 DIAGNOSIS — K219 Gastro-esophageal reflux disease without esophagitis: Secondary | ICD-10-CM | POA: Diagnosis not present

## 2016-04-02 DIAGNOSIS — F411 Generalized anxiety disorder: Secondary | ICD-10-CM | POA: Diagnosis not present

## 2016-04-02 DIAGNOSIS — F5104 Psychophysiologic insomnia: Secondary | ICD-10-CM | POA: Diagnosis not present

## 2016-04-02 DIAGNOSIS — Z992 Dependence on renal dialysis: Secondary | ICD-10-CM | POA: Diagnosis not present

## 2016-04-02 DIAGNOSIS — F339 Major depressive disorder, recurrent, unspecified: Secondary | ICD-10-CM | POA: Diagnosis not present

## 2016-04-02 DIAGNOSIS — D631 Anemia in chronic kidney disease: Secondary | ICD-10-CM | POA: Diagnosis not present

## 2016-04-02 DIAGNOSIS — S80211D Abrasion, right knee, subsequent encounter: Secondary | ICD-10-CM | POA: Diagnosis not present

## 2016-04-02 DIAGNOSIS — I10 Essential (primary) hypertension: Secondary | ICD-10-CM | POA: Diagnosis not present

## 2016-04-02 DIAGNOSIS — E878 Other disorders of electrolyte and fluid balance, not elsewhere classified: Secondary | ICD-10-CM | POA: Diagnosis not present

## 2016-04-02 DIAGNOSIS — E119 Type 2 diabetes mellitus without complications: Secondary | ICD-10-CM | POA: Diagnosis not present

## 2016-04-02 DIAGNOSIS — R092 Respiratory arrest: Secondary | ICD-10-CM | POA: Diagnosis not present

## 2016-04-02 DIAGNOSIS — F329 Major depressive disorder, single episode, unspecified: Secondary | ICD-10-CM | POA: Diagnosis not present

## 2016-04-02 DIAGNOSIS — R278 Other lack of coordination: Secondary | ICD-10-CM | POA: Diagnosis not present

## 2016-04-02 DIAGNOSIS — R740 Nonspecific elevation of levels of transaminase and lactic acid dehydrogenase [LDH]: Secondary | ICD-10-CM | POA: Diagnosis not present

## 2016-04-02 DIAGNOSIS — R531 Weakness: Secondary | ICD-10-CM | POA: Diagnosis not present

## 2016-04-02 DIAGNOSIS — N2581 Secondary hyperparathyroidism of renal origin: Secondary | ICD-10-CM | POA: Diagnosis not present

## 2016-04-02 DIAGNOSIS — E8779 Other fluid overload: Secondary | ICD-10-CM | POA: Diagnosis not present

## 2016-04-02 DIAGNOSIS — I63312 Cerebral infarction due to thrombosis of left middle cerebral artery: Secondary | ICD-10-CM | POA: Diagnosis not present

## 2016-04-02 DIAGNOSIS — M6281 Muscle weakness (generalized): Secondary | ICD-10-CM | POA: Diagnosis not present

## 2016-04-02 DIAGNOSIS — G4700 Insomnia, unspecified: Secondary | ICD-10-CM | POA: Diagnosis not present

## 2016-04-03 ENCOUNTER — Encounter: Payer: Self-pay | Admitting: Vascular Surgery

## 2016-04-04 DIAGNOSIS — E8779 Other fluid overload: Secondary | ICD-10-CM | POA: Diagnosis not present

## 2016-04-04 DIAGNOSIS — E1129 Type 2 diabetes mellitus with other diabetic kidney complication: Secondary | ICD-10-CM | POA: Diagnosis not present

## 2016-04-04 DIAGNOSIS — D631 Anemia in chronic kidney disease: Secondary | ICD-10-CM | POA: Diagnosis not present

## 2016-04-04 DIAGNOSIS — N2581 Secondary hyperparathyroidism of renal origin: Secondary | ICD-10-CM | POA: Diagnosis not present

## 2016-04-04 DIAGNOSIS — D509 Iron deficiency anemia, unspecified: Secondary | ICD-10-CM | POA: Diagnosis not present

## 2016-04-04 DIAGNOSIS — N186 End stage renal disease: Secondary | ICD-10-CM | POA: Diagnosis not present

## 2016-04-05 NOTE — Progress Notes (Deleted)
    Postoperative Access Visit   History of Present Illness  [name] is a 65 y.o. year old male who presents for postoperative follow-up for: L BC AVF PSA plication (Date: [id number]).  The patient's wounds are *** healed.  The patient notes *** steal symptoms.  The patient is *** able to complete their activities of daily living.  The patient's current symptoms are: ***.   For VQI Use Only  PRE-ADM LIVING: {VQI Pre-admission Living:20973}  AMB STATUS: {VQI Ambulatory Status:20974}   Physical Examination There were no vitals filed for this visit.  LUE: Incision is *** healed, skin feels ***, hand grip is ***/5, sensation in digits is *** intact, ***palpable thrill, bruit can *** be auscultated    Medical Decision Making  [name] is a 65 y.o. year old male who presents s/p L BC AVF PSA plication.   The patient's access is *** ready for use.  The patient's tunneled dialysis catheter can be removed after two successful cannulations and completed dialysis treatments.  Thank you for allowing [place] to participate in this patient's care.   Adele Barthel, MD, FACS [facility] [employer] Office: [phone] Pager: [phone]

## 2016-04-06 ENCOUNTER — Encounter: Payer: Medicare Other | Admitting: Vascular Surgery

## 2016-04-06 DIAGNOSIS — I071 Rheumatic tricuspid insufficiency: Secondary | ICD-10-CM | POA: Diagnosis not present

## 2016-04-06 DIAGNOSIS — I272 Other secondary pulmonary hypertension: Secondary | ICD-10-CM | POA: Diagnosis not present

## 2016-04-06 DIAGNOSIS — N2581 Secondary hyperparathyroidism of renal origin: Secondary | ICD-10-CM | POA: Diagnosis not present

## 2016-04-06 DIAGNOSIS — E1129 Type 2 diabetes mellitus with other diabetic kidney complication: Secondary | ICD-10-CM | POA: Diagnosis not present

## 2016-04-06 DIAGNOSIS — D631 Anemia in chronic kidney disease: Secondary | ICD-10-CM | POA: Diagnosis not present

## 2016-04-06 DIAGNOSIS — N186 End stage renal disease: Secondary | ICD-10-CM | POA: Diagnosis not present

## 2016-04-06 DIAGNOSIS — E8779 Other fluid overload: Secondary | ICD-10-CM | POA: Diagnosis not present

## 2016-04-06 DIAGNOSIS — R531 Weakness: Secondary | ICD-10-CM | POA: Diagnosis not present

## 2016-04-06 DIAGNOSIS — D509 Iron deficiency anemia, unspecified: Secondary | ICD-10-CM | POA: Diagnosis not present

## 2016-04-09 DIAGNOSIS — E1129 Type 2 diabetes mellitus with other diabetic kidney complication: Secondary | ICD-10-CM | POA: Diagnosis not present

## 2016-04-09 DIAGNOSIS — D509 Iron deficiency anemia, unspecified: Secondary | ICD-10-CM | POA: Diagnosis not present

## 2016-04-09 DIAGNOSIS — N186 End stage renal disease: Secondary | ICD-10-CM | POA: Diagnosis not present

## 2016-04-09 DIAGNOSIS — E8779 Other fluid overload: Secondary | ICD-10-CM | POA: Diagnosis not present

## 2016-04-09 DIAGNOSIS — D631 Anemia in chronic kidney disease: Secondary | ICD-10-CM | POA: Diagnosis not present

## 2016-04-09 DIAGNOSIS — N2581 Secondary hyperparathyroidism of renal origin: Secondary | ICD-10-CM | POA: Diagnosis not present

## 2016-04-10 DIAGNOSIS — D631 Anemia in chronic kidney disease: Secondary | ICD-10-CM | POA: Diagnosis not present

## 2016-04-10 DIAGNOSIS — N2581 Secondary hyperparathyroidism of renal origin: Secondary | ICD-10-CM | POA: Diagnosis not present

## 2016-04-10 DIAGNOSIS — E8779 Other fluid overload: Secondary | ICD-10-CM | POA: Diagnosis not present

## 2016-04-10 DIAGNOSIS — N186 End stage renal disease: Secondary | ICD-10-CM | POA: Diagnosis not present

## 2016-04-10 DIAGNOSIS — D509 Iron deficiency anemia, unspecified: Secondary | ICD-10-CM | POA: Diagnosis not present

## 2016-04-10 DIAGNOSIS — E1129 Type 2 diabetes mellitus with other diabetic kidney complication: Secondary | ICD-10-CM | POA: Diagnosis not present

## 2016-04-11 DIAGNOSIS — D509 Iron deficiency anemia, unspecified: Secondary | ICD-10-CM | POA: Diagnosis not present

## 2016-04-11 DIAGNOSIS — E1129 Type 2 diabetes mellitus with other diabetic kidney complication: Secondary | ICD-10-CM | POA: Diagnosis not present

## 2016-04-11 DIAGNOSIS — E8779 Other fluid overload: Secondary | ICD-10-CM | POA: Diagnosis not present

## 2016-04-11 DIAGNOSIS — N2581 Secondary hyperparathyroidism of renal origin: Secondary | ICD-10-CM | POA: Diagnosis not present

## 2016-04-11 DIAGNOSIS — N186 End stage renal disease: Secondary | ICD-10-CM | POA: Diagnosis not present

## 2016-04-11 DIAGNOSIS — D631 Anemia in chronic kidney disease: Secondary | ICD-10-CM | POA: Diagnosis not present

## 2016-04-12 DIAGNOSIS — Z992 Dependence on renal dialysis: Secondary | ICD-10-CM | POA: Diagnosis not present

## 2016-04-12 DIAGNOSIS — E1129 Type 2 diabetes mellitus with other diabetic kidney complication: Secondary | ICD-10-CM | POA: Diagnosis not present

## 2016-04-12 DIAGNOSIS — N186 End stage renal disease: Secondary | ICD-10-CM | POA: Diagnosis not present

## 2016-04-13 DIAGNOSIS — K219 Gastro-esophageal reflux disease without esophagitis: Secondary | ICD-10-CM | POA: Diagnosis not present

## 2016-04-13 DIAGNOSIS — I63512 Cerebral infarction due to unspecified occlusion or stenosis of left middle cerebral artery: Secondary | ICD-10-CM | POA: Diagnosis not present

## 2016-04-13 DIAGNOSIS — Z23 Encounter for immunization: Secondary | ICD-10-CM | POA: Diagnosis not present

## 2016-04-13 DIAGNOSIS — F339 Major depressive disorder, recurrent, unspecified: Secondary | ICD-10-CM | POA: Diagnosis not present

## 2016-04-13 DIAGNOSIS — D509 Iron deficiency anemia, unspecified: Secondary | ICD-10-CM | POA: Diagnosis not present

## 2016-04-13 DIAGNOSIS — F411 Generalized anxiety disorder: Secondary | ICD-10-CM | POA: Diagnosis not present

## 2016-04-13 DIAGNOSIS — E1129 Type 2 diabetes mellitus with other diabetic kidney complication: Secondary | ICD-10-CM | POA: Diagnosis not present

## 2016-04-13 DIAGNOSIS — F5104 Psychophysiologic insomnia: Secondary | ICD-10-CM | POA: Diagnosis not present

## 2016-04-13 DIAGNOSIS — N185 Chronic kidney disease, stage 5: Secondary | ICD-10-CM | POA: Diagnosis not present

## 2016-04-13 DIAGNOSIS — I10 Essential (primary) hypertension: Secondary | ICD-10-CM | POA: Diagnosis not present

## 2016-04-13 DIAGNOSIS — Z992 Dependence on renal dialysis: Secondary | ICD-10-CM | POA: Diagnosis not present

## 2016-04-13 DIAGNOSIS — N186 End stage renal disease: Secondary | ICD-10-CM | POA: Diagnosis not present

## 2016-04-13 DIAGNOSIS — N2581 Secondary hyperparathyroidism of renal origin: Secondary | ICD-10-CM | POA: Diagnosis not present

## 2016-04-13 DIAGNOSIS — D631 Anemia in chronic kidney disease: Secondary | ICD-10-CM | POA: Diagnosis not present

## 2016-04-13 DIAGNOSIS — E785 Hyperlipidemia, unspecified: Secondary | ICD-10-CM | POA: Diagnosis not present

## 2016-04-14 DIAGNOSIS — I63312 Cerebral infarction due to thrombosis of left middle cerebral artery: Secondary | ICD-10-CM | POA: Diagnosis not present

## 2016-04-16 DIAGNOSIS — D631 Anemia in chronic kidney disease: Secondary | ICD-10-CM | POA: Diagnosis not present

## 2016-04-16 DIAGNOSIS — Z23 Encounter for immunization: Secondary | ICD-10-CM | POA: Diagnosis not present

## 2016-04-16 DIAGNOSIS — N2581 Secondary hyperparathyroidism of renal origin: Secondary | ICD-10-CM | POA: Diagnosis not present

## 2016-04-16 DIAGNOSIS — D509 Iron deficiency anemia, unspecified: Secondary | ICD-10-CM | POA: Diagnosis not present

## 2016-04-16 DIAGNOSIS — N186 End stage renal disease: Secondary | ICD-10-CM | POA: Diagnosis not present

## 2016-04-16 DIAGNOSIS — E1129 Type 2 diabetes mellitus with other diabetic kidney complication: Secondary | ICD-10-CM | POA: Diagnosis not present

## 2016-04-18 DIAGNOSIS — D509 Iron deficiency anemia, unspecified: Secondary | ICD-10-CM | POA: Diagnosis not present

## 2016-04-18 DIAGNOSIS — N2581 Secondary hyperparathyroidism of renal origin: Secondary | ICD-10-CM | POA: Diagnosis not present

## 2016-04-18 DIAGNOSIS — Z23 Encounter for immunization: Secondary | ICD-10-CM | POA: Diagnosis not present

## 2016-04-18 DIAGNOSIS — N186 End stage renal disease: Secondary | ICD-10-CM | POA: Diagnosis not present

## 2016-04-18 DIAGNOSIS — D631 Anemia in chronic kidney disease: Secondary | ICD-10-CM | POA: Diagnosis not present

## 2016-04-18 DIAGNOSIS — E1129 Type 2 diabetes mellitus with other diabetic kidney complication: Secondary | ICD-10-CM | POA: Diagnosis not present

## 2016-04-20 DIAGNOSIS — D509 Iron deficiency anemia, unspecified: Secondary | ICD-10-CM | POA: Diagnosis not present

## 2016-04-20 DIAGNOSIS — D631 Anemia in chronic kidney disease: Secondary | ICD-10-CM | POA: Diagnosis not present

## 2016-04-20 DIAGNOSIS — N2581 Secondary hyperparathyroidism of renal origin: Secondary | ICD-10-CM | POA: Diagnosis not present

## 2016-04-20 DIAGNOSIS — E1129 Type 2 diabetes mellitus with other diabetic kidney complication: Secondary | ICD-10-CM | POA: Diagnosis not present

## 2016-04-20 DIAGNOSIS — N186 End stage renal disease: Secondary | ICD-10-CM | POA: Diagnosis not present

## 2016-04-20 DIAGNOSIS — Z23 Encounter for immunization: Secondary | ICD-10-CM | POA: Diagnosis not present

## 2016-04-23 DIAGNOSIS — Z23 Encounter for immunization: Secondary | ICD-10-CM | POA: Diagnosis not present

## 2016-04-23 DIAGNOSIS — N2581 Secondary hyperparathyroidism of renal origin: Secondary | ICD-10-CM | POA: Diagnosis not present

## 2016-04-23 DIAGNOSIS — D509 Iron deficiency anemia, unspecified: Secondary | ICD-10-CM | POA: Diagnosis not present

## 2016-04-23 DIAGNOSIS — E1129 Type 2 diabetes mellitus with other diabetic kidney complication: Secondary | ICD-10-CM | POA: Diagnosis not present

## 2016-04-23 DIAGNOSIS — N186 End stage renal disease: Secondary | ICD-10-CM | POA: Diagnosis not present

## 2016-04-23 DIAGNOSIS — D631 Anemia in chronic kidney disease: Secondary | ICD-10-CM | POA: Diagnosis not present

## 2016-04-25 DIAGNOSIS — Z23 Encounter for immunization: Secondary | ICD-10-CM | POA: Diagnosis not present

## 2016-04-25 DIAGNOSIS — D509 Iron deficiency anemia, unspecified: Secondary | ICD-10-CM | POA: Diagnosis not present

## 2016-04-25 DIAGNOSIS — N2581 Secondary hyperparathyroidism of renal origin: Secondary | ICD-10-CM | POA: Diagnosis not present

## 2016-04-25 DIAGNOSIS — E1129 Type 2 diabetes mellitus with other diabetic kidney complication: Secondary | ICD-10-CM | POA: Diagnosis not present

## 2016-04-25 DIAGNOSIS — D631 Anemia in chronic kidney disease: Secondary | ICD-10-CM | POA: Diagnosis not present

## 2016-04-25 DIAGNOSIS — N186 End stage renal disease: Secondary | ICD-10-CM | POA: Diagnosis not present

## 2016-04-27 DIAGNOSIS — D509 Iron deficiency anemia, unspecified: Secondary | ICD-10-CM | POA: Diagnosis not present

## 2016-04-27 DIAGNOSIS — D631 Anemia in chronic kidney disease: Secondary | ICD-10-CM | POA: Diagnosis not present

## 2016-04-27 DIAGNOSIS — E1129 Type 2 diabetes mellitus with other diabetic kidney complication: Secondary | ICD-10-CM | POA: Diagnosis not present

## 2016-04-27 DIAGNOSIS — N186 End stage renal disease: Secondary | ICD-10-CM | POA: Diagnosis not present

## 2016-04-27 DIAGNOSIS — N2581 Secondary hyperparathyroidism of renal origin: Secondary | ICD-10-CM | POA: Diagnosis not present

## 2016-04-27 DIAGNOSIS — Z23 Encounter for immunization: Secondary | ICD-10-CM | POA: Diagnosis not present

## 2016-04-30 DIAGNOSIS — D631 Anemia in chronic kidney disease: Secondary | ICD-10-CM | POA: Diagnosis not present

## 2016-04-30 DIAGNOSIS — D509 Iron deficiency anemia, unspecified: Secondary | ICD-10-CM | POA: Diagnosis not present

## 2016-04-30 DIAGNOSIS — N2581 Secondary hyperparathyroidism of renal origin: Secondary | ICD-10-CM | POA: Diagnosis not present

## 2016-04-30 DIAGNOSIS — E1129 Type 2 diabetes mellitus with other diabetic kidney complication: Secondary | ICD-10-CM | POA: Diagnosis not present

## 2016-04-30 DIAGNOSIS — N186 End stage renal disease: Secondary | ICD-10-CM | POA: Diagnosis not present

## 2016-04-30 DIAGNOSIS — Z23 Encounter for immunization: Secondary | ICD-10-CM | POA: Diagnosis not present

## 2016-05-02 DIAGNOSIS — E1129 Type 2 diabetes mellitus with other diabetic kidney complication: Secondary | ICD-10-CM | POA: Diagnosis not present

## 2016-05-02 DIAGNOSIS — N186 End stage renal disease: Secondary | ICD-10-CM | POA: Diagnosis not present

## 2016-05-02 DIAGNOSIS — D631 Anemia in chronic kidney disease: Secondary | ICD-10-CM | POA: Diagnosis not present

## 2016-05-02 DIAGNOSIS — D509 Iron deficiency anemia, unspecified: Secondary | ICD-10-CM | POA: Diagnosis not present

## 2016-05-02 DIAGNOSIS — N2581 Secondary hyperparathyroidism of renal origin: Secondary | ICD-10-CM | POA: Diagnosis not present

## 2016-05-02 DIAGNOSIS — Z23 Encounter for immunization: Secondary | ICD-10-CM | POA: Diagnosis not present

## 2016-05-04 DIAGNOSIS — Z23 Encounter for immunization: Secondary | ICD-10-CM | POA: Diagnosis not present

## 2016-05-04 DIAGNOSIS — D631 Anemia in chronic kidney disease: Secondary | ICD-10-CM | POA: Diagnosis not present

## 2016-05-04 DIAGNOSIS — E1129 Type 2 diabetes mellitus with other diabetic kidney complication: Secondary | ICD-10-CM | POA: Diagnosis not present

## 2016-05-04 DIAGNOSIS — N2581 Secondary hyperparathyroidism of renal origin: Secondary | ICD-10-CM | POA: Diagnosis not present

## 2016-05-04 DIAGNOSIS — D509 Iron deficiency anemia, unspecified: Secondary | ICD-10-CM | POA: Diagnosis not present

## 2016-05-04 DIAGNOSIS — N186 End stage renal disease: Secondary | ICD-10-CM | POA: Diagnosis not present

## 2016-05-07 DIAGNOSIS — N2581 Secondary hyperparathyroidism of renal origin: Secondary | ICD-10-CM | POA: Diagnosis not present

## 2016-05-07 DIAGNOSIS — N186 End stage renal disease: Secondary | ICD-10-CM | POA: Diagnosis not present

## 2016-05-07 DIAGNOSIS — Z23 Encounter for immunization: Secondary | ICD-10-CM | POA: Diagnosis not present

## 2016-05-07 DIAGNOSIS — D509 Iron deficiency anemia, unspecified: Secondary | ICD-10-CM | POA: Diagnosis not present

## 2016-05-07 DIAGNOSIS — E1129 Type 2 diabetes mellitus with other diabetic kidney complication: Secondary | ICD-10-CM | POA: Diagnosis not present

## 2016-05-07 DIAGNOSIS — D631 Anemia in chronic kidney disease: Secondary | ICD-10-CM | POA: Diagnosis not present

## 2016-05-09 DIAGNOSIS — N2581 Secondary hyperparathyroidism of renal origin: Secondary | ICD-10-CM | POA: Diagnosis not present

## 2016-05-09 DIAGNOSIS — D631 Anemia in chronic kidney disease: Secondary | ICD-10-CM | POA: Diagnosis not present

## 2016-05-09 DIAGNOSIS — Z23 Encounter for immunization: Secondary | ICD-10-CM | POA: Diagnosis not present

## 2016-05-09 DIAGNOSIS — D509 Iron deficiency anemia, unspecified: Secondary | ICD-10-CM | POA: Diagnosis not present

## 2016-05-09 DIAGNOSIS — N186 End stage renal disease: Secondary | ICD-10-CM | POA: Diagnosis not present

## 2016-05-09 DIAGNOSIS — E1129 Type 2 diabetes mellitus with other diabetic kidney complication: Secondary | ICD-10-CM | POA: Diagnosis not present

## 2016-05-11 DIAGNOSIS — D509 Iron deficiency anemia, unspecified: Secondary | ICD-10-CM | POA: Diagnosis not present

## 2016-05-11 DIAGNOSIS — Z23 Encounter for immunization: Secondary | ICD-10-CM | POA: Diagnosis not present

## 2016-05-11 DIAGNOSIS — E1129 Type 2 diabetes mellitus with other diabetic kidney complication: Secondary | ICD-10-CM | POA: Diagnosis not present

## 2016-05-11 DIAGNOSIS — N2581 Secondary hyperparathyroidism of renal origin: Secondary | ICD-10-CM | POA: Diagnosis not present

## 2016-05-11 DIAGNOSIS — D631 Anemia in chronic kidney disease: Secondary | ICD-10-CM | POA: Diagnosis not present

## 2016-05-11 DIAGNOSIS — N186 End stage renal disease: Secondary | ICD-10-CM | POA: Diagnosis not present

## 2016-05-12 DIAGNOSIS — Z992 Dependence on renal dialysis: Secondary | ICD-10-CM | POA: Diagnosis not present

## 2016-05-12 DIAGNOSIS — N186 End stage renal disease: Secondary | ICD-10-CM | POA: Diagnosis not present

## 2016-05-12 DIAGNOSIS — E1129 Type 2 diabetes mellitus with other diabetic kidney complication: Secondary | ICD-10-CM | POA: Diagnosis not present

## 2016-05-13 DIAGNOSIS — F339 Major depressive disorder, recurrent, unspecified: Secondary | ICD-10-CM | POA: Diagnosis not present

## 2016-05-13 DIAGNOSIS — Z992 Dependence on renal dialysis: Secondary | ICD-10-CM | POA: Diagnosis not present

## 2016-05-13 DIAGNOSIS — I63512 Cerebral infarction due to unspecified occlusion or stenosis of left middle cerebral artery: Secondary | ICD-10-CM | POA: Diagnosis not present

## 2016-05-13 DIAGNOSIS — E785 Hyperlipidemia, unspecified: Secondary | ICD-10-CM | POA: Diagnosis not present

## 2016-05-13 DIAGNOSIS — K219 Gastro-esophageal reflux disease without esophagitis: Secondary | ICD-10-CM | POA: Diagnosis not present

## 2016-05-13 DIAGNOSIS — F5104 Psychophysiologic insomnia: Secondary | ICD-10-CM | POA: Diagnosis not present

## 2016-05-13 DIAGNOSIS — F411 Generalized anxiety disorder: Secondary | ICD-10-CM | POA: Diagnosis not present

## 2016-05-13 DIAGNOSIS — I10 Essential (primary) hypertension: Secondary | ICD-10-CM | POA: Diagnosis not present

## 2016-05-13 DIAGNOSIS — N185 Chronic kidney disease, stage 5: Secondary | ICD-10-CM | POA: Diagnosis not present

## 2016-05-14 DIAGNOSIS — D631 Anemia in chronic kidney disease: Secondary | ICD-10-CM | POA: Diagnosis not present

## 2016-05-14 DIAGNOSIS — N2581 Secondary hyperparathyroidism of renal origin: Secondary | ICD-10-CM | POA: Diagnosis not present

## 2016-05-14 DIAGNOSIS — E1129 Type 2 diabetes mellitus with other diabetic kidney complication: Secondary | ICD-10-CM | POA: Diagnosis not present

## 2016-05-14 DIAGNOSIS — N186 End stage renal disease: Secondary | ICD-10-CM | POA: Diagnosis not present

## 2016-05-15 DIAGNOSIS — I63312 Cerebral infarction due to thrombosis of left middle cerebral artery: Secondary | ICD-10-CM | POA: Diagnosis not present

## 2016-05-16 DIAGNOSIS — N186 End stage renal disease: Secondary | ICD-10-CM | POA: Diagnosis not present

## 2016-05-16 DIAGNOSIS — E1129 Type 2 diabetes mellitus with other diabetic kidney complication: Secondary | ICD-10-CM | POA: Diagnosis not present

## 2016-05-16 DIAGNOSIS — N2581 Secondary hyperparathyroidism of renal origin: Secondary | ICD-10-CM | POA: Diagnosis not present

## 2016-05-16 DIAGNOSIS — D631 Anemia in chronic kidney disease: Secondary | ICD-10-CM | POA: Diagnosis not present

## 2016-05-18 DIAGNOSIS — N186 End stage renal disease: Secondary | ICD-10-CM | POA: Diagnosis not present

## 2016-05-18 DIAGNOSIS — E1129 Type 2 diabetes mellitus with other diabetic kidney complication: Secondary | ICD-10-CM | POA: Diagnosis not present

## 2016-05-18 DIAGNOSIS — N2581 Secondary hyperparathyroidism of renal origin: Secondary | ICD-10-CM | POA: Diagnosis not present

## 2016-05-18 DIAGNOSIS — D631 Anemia in chronic kidney disease: Secondary | ICD-10-CM | POA: Diagnosis not present

## 2016-05-21 DIAGNOSIS — D631 Anemia in chronic kidney disease: Secondary | ICD-10-CM | POA: Diagnosis not present

## 2016-05-21 DIAGNOSIS — N2581 Secondary hyperparathyroidism of renal origin: Secondary | ICD-10-CM | POA: Diagnosis not present

## 2016-05-21 DIAGNOSIS — E1129 Type 2 diabetes mellitus with other diabetic kidney complication: Secondary | ICD-10-CM | POA: Diagnosis not present

## 2016-05-21 DIAGNOSIS — N186 End stage renal disease: Secondary | ICD-10-CM | POA: Diagnosis not present

## 2016-05-23 DIAGNOSIS — E1129 Type 2 diabetes mellitus with other diabetic kidney complication: Secondary | ICD-10-CM | POA: Diagnosis not present

## 2016-05-23 DIAGNOSIS — D631 Anemia in chronic kidney disease: Secondary | ICD-10-CM | POA: Diagnosis not present

## 2016-05-23 DIAGNOSIS — N186 End stage renal disease: Secondary | ICD-10-CM | POA: Diagnosis not present

## 2016-05-23 DIAGNOSIS — N2581 Secondary hyperparathyroidism of renal origin: Secondary | ICD-10-CM | POA: Diagnosis not present

## 2016-05-25 DIAGNOSIS — D631 Anemia in chronic kidney disease: Secondary | ICD-10-CM | POA: Diagnosis not present

## 2016-05-25 DIAGNOSIS — E1129 Type 2 diabetes mellitus with other diabetic kidney complication: Secondary | ICD-10-CM | POA: Diagnosis not present

## 2016-05-25 DIAGNOSIS — N186 End stage renal disease: Secondary | ICD-10-CM | POA: Diagnosis not present

## 2016-05-25 DIAGNOSIS — N2581 Secondary hyperparathyroidism of renal origin: Secondary | ICD-10-CM | POA: Diagnosis not present

## 2016-05-28 DIAGNOSIS — D631 Anemia in chronic kidney disease: Secondary | ICD-10-CM | POA: Diagnosis not present

## 2016-05-28 DIAGNOSIS — N2581 Secondary hyperparathyroidism of renal origin: Secondary | ICD-10-CM | POA: Diagnosis not present

## 2016-05-28 DIAGNOSIS — E1129 Type 2 diabetes mellitus with other diabetic kidney complication: Secondary | ICD-10-CM | POA: Diagnosis not present

## 2016-05-28 DIAGNOSIS — N186 End stage renal disease: Secondary | ICD-10-CM | POA: Diagnosis not present

## 2016-05-30 DIAGNOSIS — D631 Anemia in chronic kidney disease: Secondary | ICD-10-CM | POA: Diagnosis not present

## 2016-05-30 DIAGNOSIS — N2581 Secondary hyperparathyroidism of renal origin: Secondary | ICD-10-CM | POA: Diagnosis not present

## 2016-05-30 DIAGNOSIS — E1129 Type 2 diabetes mellitus with other diabetic kidney complication: Secondary | ICD-10-CM | POA: Diagnosis not present

## 2016-05-30 DIAGNOSIS — N186 End stage renal disease: Secondary | ICD-10-CM | POA: Diagnosis not present

## 2016-06-01 DIAGNOSIS — E1129 Type 2 diabetes mellitus with other diabetic kidney complication: Secondary | ICD-10-CM | POA: Diagnosis not present

## 2016-06-01 DIAGNOSIS — D631 Anemia in chronic kidney disease: Secondary | ICD-10-CM | POA: Diagnosis not present

## 2016-06-01 DIAGNOSIS — N2581 Secondary hyperparathyroidism of renal origin: Secondary | ICD-10-CM | POA: Diagnosis not present

## 2016-06-01 DIAGNOSIS — N186 End stage renal disease: Secondary | ICD-10-CM | POA: Diagnosis not present

## 2016-06-04 DIAGNOSIS — N2581 Secondary hyperparathyroidism of renal origin: Secondary | ICD-10-CM | POA: Diagnosis not present

## 2016-06-04 DIAGNOSIS — N186 End stage renal disease: Secondary | ICD-10-CM | POA: Diagnosis not present

## 2016-06-04 DIAGNOSIS — E1129 Type 2 diabetes mellitus with other diabetic kidney complication: Secondary | ICD-10-CM | POA: Diagnosis not present

## 2016-06-04 DIAGNOSIS — D631 Anemia in chronic kidney disease: Secondary | ICD-10-CM | POA: Diagnosis not present

## 2016-06-06 DIAGNOSIS — D631 Anemia in chronic kidney disease: Secondary | ICD-10-CM | POA: Diagnosis not present

## 2016-06-06 DIAGNOSIS — E1129 Type 2 diabetes mellitus with other diabetic kidney complication: Secondary | ICD-10-CM | POA: Diagnosis not present

## 2016-06-06 DIAGNOSIS — N2581 Secondary hyperparathyroidism of renal origin: Secondary | ICD-10-CM | POA: Diagnosis not present

## 2016-06-06 DIAGNOSIS — N186 End stage renal disease: Secondary | ICD-10-CM | POA: Diagnosis not present

## 2016-06-08 DIAGNOSIS — E1129 Type 2 diabetes mellitus with other diabetic kidney complication: Secondary | ICD-10-CM | POA: Diagnosis not present

## 2016-06-08 DIAGNOSIS — N2581 Secondary hyperparathyroidism of renal origin: Secondary | ICD-10-CM | POA: Diagnosis not present

## 2016-06-08 DIAGNOSIS — N186 End stage renal disease: Secondary | ICD-10-CM | POA: Diagnosis not present

## 2016-06-08 DIAGNOSIS — D631 Anemia in chronic kidney disease: Secondary | ICD-10-CM | POA: Diagnosis not present

## 2016-06-11 DIAGNOSIS — N2581 Secondary hyperparathyroidism of renal origin: Secondary | ICD-10-CM | POA: Diagnosis not present

## 2016-06-11 DIAGNOSIS — E1129 Type 2 diabetes mellitus with other diabetic kidney complication: Secondary | ICD-10-CM | POA: Diagnosis not present

## 2016-06-11 DIAGNOSIS — N186 End stage renal disease: Secondary | ICD-10-CM | POA: Diagnosis not present

## 2016-06-11 DIAGNOSIS — D631 Anemia in chronic kidney disease: Secondary | ICD-10-CM | POA: Diagnosis not present

## 2016-06-12 DIAGNOSIS — E1129 Type 2 diabetes mellitus with other diabetic kidney complication: Secondary | ICD-10-CM | POA: Diagnosis not present

## 2016-06-12 DIAGNOSIS — Z992 Dependence on renal dialysis: Secondary | ICD-10-CM | POA: Diagnosis not present

## 2016-06-12 DIAGNOSIS — N186 End stage renal disease: Secondary | ICD-10-CM | POA: Diagnosis not present

## 2016-06-13 DIAGNOSIS — D631 Anemia in chronic kidney disease: Secondary | ICD-10-CM | POA: Diagnosis not present

## 2016-06-13 DIAGNOSIS — N2581 Secondary hyperparathyroidism of renal origin: Secondary | ICD-10-CM | POA: Diagnosis not present

## 2016-06-13 DIAGNOSIS — E1129 Type 2 diabetes mellitus with other diabetic kidney complication: Secondary | ICD-10-CM | POA: Diagnosis not present

## 2016-06-13 DIAGNOSIS — N186 End stage renal disease: Secondary | ICD-10-CM | POA: Diagnosis not present

## 2016-06-15 DIAGNOSIS — N186 End stage renal disease: Secondary | ICD-10-CM | POA: Diagnosis not present

## 2016-06-15 DIAGNOSIS — D631 Anemia in chronic kidney disease: Secondary | ICD-10-CM | POA: Diagnosis not present

## 2016-06-15 DIAGNOSIS — E1129 Type 2 diabetes mellitus with other diabetic kidney complication: Secondary | ICD-10-CM | POA: Diagnosis not present

## 2016-06-15 DIAGNOSIS — N2581 Secondary hyperparathyroidism of renal origin: Secondary | ICD-10-CM | POA: Diagnosis not present

## 2016-06-17 DIAGNOSIS — Z8673 Personal history of transient ischemic attack (TIA), and cerebral infarction without residual deficits: Secondary | ICD-10-CM | POA: Diagnosis not present

## 2016-06-17 DIAGNOSIS — J449 Chronic obstructive pulmonary disease, unspecified: Secondary | ICD-10-CM | POA: Diagnosis not present

## 2016-06-17 DIAGNOSIS — Z794 Long term (current) use of insulin: Secondary | ICD-10-CM | POA: Diagnosis not present

## 2016-06-17 DIAGNOSIS — Z7982 Long term (current) use of aspirin: Secondary | ICD-10-CM | POA: Diagnosis not present

## 2016-06-17 DIAGNOSIS — F329 Major depressive disorder, single episode, unspecified: Secondary | ICD-10-CM | POA: Diagnosis not present

## 2016-06-17 DIAGNOSIS — D631 Anemia in chronic kidney disease: Secondary | ICD-10-CM | POA: Diagnosis not present

## 2016-06-17 DIAGNOSIS — E1122 Type 2 diabetes mellitus with diabetic chronic kidney disease: Secondary | ICD-10-CM | POA: Diagnosis not present

## 2016-06-17 DIAGNOSIS — Z992 Dependence on renal dialysis: Secondary | ICD-10-CM | POA: Diagnosis not present

## 2016-06-17 DIAGNOSIS — E1142 Type 2 diabetes mellitus with diabetic polyneuropathy: Secondary | ICD-10-CM | POA: Diagnosis not present

## 2016-06-17 DIAGNOSIS — N185 Chronic kidney disease, stage 5: Secondary | ICD-10-CM | POA: Diagnosis not present

## 2016-06-17 DIAGNOSIS — F411 Generalized anxiety disorder: Secondary | ICD-10-CM | POA: Diagnosis not present

## 2016-06-17 DIAGNOSIS — K219 Gastro-esophageal reflux disease without esophagitis: Secondary | ICD-10-CM | POA: Diagnosis not present

## 2016-06-17 DIAGNOSIS — I120 Hypertensive chronic kidney disease with stage 5 chronic kidney disease or end stage renal disease: Secondary | ICD-10-CM | POA: Diagnosis not present

## 2016-06-18 ENCOUNTER — Emergency Department (HOSPITAL_COMMUNITY)
Admission: EM | Admit: 2016-06-18 | Discharge: 2016-06-18 | Disposition: A | Discharge status: Home / Self Care | Payer: Medicare Other | Attending: Emergency Medicine | Admitting: Emergency Medicine

## 2016-06-18 ENCOUNTER — Emergency Department (HOSPITAL_COMMUNITY): Payer: Medicare Other

## 2016-06-18 ENCOUNTER — Encounter (HOSPITAL_COMMUNITY): Payer: Self-pay | Admitting: *Deleted

## 2016-06-18 DIAGNOSIS — R0602 Shortness of breath: Secondary | ICD-10-CM | POA: Diagnosis not present

## 2016-06-18 DIAGNOSIS — N2581 Secondary hyperparathyroidism of renal origin: Secondary | ICD-10-CM | POA: Diagnosis not present

## 2016-06-18 DIAGNOSIS — N186 End stage renal disease: Secondary | ICD-10-CM | POA: Insufficient documentation

## 2016-06-18 DIAGNOSIS — Z794 Long term (current) use of insulin: Secondary | ICD-10-CM | POA: Insufficient documentation

## 2016-06-18 DIAGNOSIS — J40 Bronchitis, not specified as acute or chronic: Secondary | ICD-10-CM | POA: Insufficient documentation

## 2016-06-18 DIAGNOSIS — R05 Cough: Secondary | ICD-10-CM | POA: Diagnosis not present

## 2016-06-18 DIAGNOSIS — Z87891 Personal history of nicotine dependence: Secondary | ICD-10-CM | POA: Diagnosis not present

## 2016-06-18 DIAGNOSIS — D631 Anemia in chronic kidney disease: Secondary | ICD-10-CM | POA: Diagnosis not present

## 2016-06-18 DIAGNOSIS — Z992 Dependence on renal dialysis: Secondary | ICD-10-CM | POA: Insufficient documentation

## 2016-06-18 DIAGNOSIS — J449 Chronic obstructive pulmonary disease, unspecified: Secondary | ICD-10-CM | POA: Insufficient documentation

## 2016-06-18 DIAGNOSIS — E1122 Type 2 diabetes mellitus with diabetic chronic kidney disease: Secondary | ICD-10-CM | POA: Insufficient documentation

## 2016-06-18 DIAGNOSIS — Z79899 Other long term (current) drug therapy: Secondary | ICD-10-CM | POA: Insufficient documentation

## 2016-06-18 DIAGNOSIS — R069 Unspecified abnormalities of breathing: Secondary | ICD-10-CM | POA: Diagnosis not present

## 2016-06-18 DIAGNOSIS — I120 Hypertensive chronic kidney disease with stage 5 chronic kidney disease or end stage renal disease: Secondary | ICD-10-CM | POA: Diagnosis not present

## 2016-06-18 DIAGNOSIS — E1129 Type 2 diabetes mellitus with other diabetic kidney complication: Secondary | ICD-10-CM | POA: Diagnosis not present

## 2016-06-18 LAB — BASIC METABOLIC PANEL
ANION GAP: 14 (ref 5–15)
BUN: 36 mg/dL — ABNORMAL HIGH (ref 6–20)
CALCIUM: 9.1 mg/dL (ref 8.9–10.3)
CO2: 28 mmol/L (ref 22–32)
CREATININE: 5.18 mg/dL — AB (ref 0.61–1.24)
Chloride: 96 mmol/L — ABNORMAL LOW (ref 101–111)
GFR, EST AFRICAN AMERICAN: 12 mL/min — AB (ref >60)
GFR, EST NON AFRICAN AMERICAN: 10 mL/min — AB (ref >60)
Glucose, Bld: 141 mg/dL — ABNORMAL HIGH (ref 65–99)
Potassium: 3.7 mmol/L (ref 3.5–5.1)
SODIUM: 138 mmol/L (ref 135–145)

## 2016-06-18 LAB — CBC WITH DIFFERENTIAL/PLATELET
BASOS ABS: 0 10*3/uL (ref 0.0–0.1)
BASOS PCT: 0 %
Eosinophils Absolute: 0.2 10*3/uL (ref 0.0–0.7)
Eosinophils Relative: 2 %
HEMATOCRIT: 32.5 % — AB (ref 39.0–52.0)
HEMOGLOBIN: 10.9 g/dL — AB (ref 13.0–17.0)
Lymphocytes Relative: 16 %
Lymphs Abs: 1.2 10*3/uL (ref 0.7–4.0)
MCH: 32.5 pg (ref 26.0–34.0)
MCHC: 33.5 g/dL (ref 30.0–36.0)
MCV: 97 fL (ref 78.0–100.0)
MONOS PCT: 10 %
Monocytes Absolute: 0.7 10*3/uL (ref 0.1–1.0)
NEUTROS ABS: 5.3 10*3/uL (ref 1.7–7.7)
NEUTROS PCT: 72 %
Platelets: 152 10*3/uL (ref 150–400)
RBC: 3.35 MIL/uL — ABNORMAL LOW (ref 4.22–5.81)
RDW: 16.2 % — AB (ref 11.5–15.5)
WBC: 7.5 10*3/uL (ref 4.0–10.5)

## 2016-06-18 MED ORDER — AMOXICILLIN-POT CLAVULANATE 875-125 MG PO TABS
1.0000 | ORAL_TABLET | Freq: Once | ORAL | Status: AC
  Administered 2016-06-18: 1 via ORAL
  Filled 2016-06-18: qty 1

## 2016-06-18 MED ORDER — ACETAMINOPHEN 325 MG PO TABS
650.0000 mg | ORAL_TABLET | Freq: Once | ORAL | Status: AC
  Administered 2016-06-18: 650 mg via ORAL
  Filled 2016-06-18: qty 2

## 2016-06-18 MED ORDER — SODIUM BICARBONATE 8.4 % IV SOLN: 100.0000 meq | Freq: Once | INTRAVENOUS | Status: DC

## 2016-06-18 MED ORDER — ALBUTEROL SULFATE (2.5 MG/3ML) 0.083% IN NEBU
5.0000 mg | INHALATION_SOLUTION | Freq: Once | RESPIRATORY_TRACT | Status: AC
  Administered 2016-06-18: 5 mg via RESPIRATORY_TRACT
  Filled 2016-06-18: qty 6

## 2016-06-18 MED ORDER — PREDNISONE 50 MG PO TABS
60.0000 mg | ORAL_TABLET | Freq: Once | ORAL | Status: AC
  Administered 2016-06-18: 60 mg via ORAL
  Filled 2016-06-18: qty 1

## 2016-06-18 MED ORDER — PREDNISONE 10 MG PO TABS: ORAL_TABLET | ORAL | 0 refills | Status: DC

## 2016-06-18 MED ORDER — AMOXICILLIN-POT CLAVULANATE 875-125 MG PO TABS: 1.0000 | ORAL_TABLET | Freq: Two times a day (BID) | ORAL | 0 refills | Status: DC

## 2016-06-18 NOTE — Discharge Instructions (Signed)
Use your nebulizer every 3-4 hours as needed for cough or trouble breathing.  Take Tylenol every 4 hours if needed for fever.

## 2016-06-18 NOTE — ED Triage Notes (Signed)
Pt arrived via [facility] from dialysis center where patient received full dialysis.  Pt reports SOB with productive cough (thick green/brown phlegm) x 3 days.  Pt says he gets dialysis 3 times per week.  Pt has hx of COPD.

## 2016-06-18 NOTE — ED Provider Notes (Signed)
[facility] DEPT Provider Note   CSN: [id number] Arrival date & time: 06/18/16  1552     History   Chief Complaint Chief Complaint  Patient presents with  . Shortness of Breath    HPI [name] is a 65 y.o. male.  He presents for evaluation of cough, occasionally productive of yellow sputum. Onset of symptoms several days ago. He was discharged from a rehabilitation facility, 3 days ago. While in rehabilitation, he was receiving albuterol nebulizers, but at home is using an albuterol inhaler. His appetite has been good. He denies fever, chest pain, new weakness or dizziness. He is otherwise taking his medicines as prescribed. There are no other known modifying factors.    HPI  Past Medical History:  Diagnosis Date  . Anemia   . Anxiety   . Arthritis   . Chronic kidney disease   . COPD (chronic obstructive pulmonary disease) ([place])    Pt denies  . Depression   . Diabetes mellitus without complication ([place])   . GERD (gastroesophageal reflux disease)   . H/O hiatal hernia   . Headache(784.0)    Hx: Migraines  . Hypertension   . Neuropathy ([place])   . Numbness of toes    toes and feet  . Pneumonia    Hx: of several times  . Renal insufficiency   . Shortness of breath dyspnea   . Type 2 diabetes mellitus [facility])     Patient Active Problem List   Diagnosis Date Noted  . Acute respiratory failure with hypoxia ([place]) 08/28/2015  . Hypertensive urgency 08/28/2015  . Chronic anemia 08/28/2015  . ESRD on dialysis ([place]) 08/28/2015  . Diabetes mellitus with end stage renal disease ([place]) 08/28/2015  . PVD (peripheral vascular disease) ([place]) 07/15/2014  . Peripheral vascular disease, unspecified 01/14/2014  . Pain in limb-left arm 04/09/2013  . Guaiac + stool 04/07/2013  . Anemia 04/07/2013  . Dysphagia, unspecified(787.20) 04/07/2013  . Numbness and tingling-left arm  02/04/2013  . Cold hands and feet-left arm 02/04/2013  . Other complications due to renal  dialysis device, implant, and graft 06/06/2012  . End stage renal disease ([place]) 02/05/2012  . Injury to blood vessels, unspecified site 02/05/2012  . Pain in joint, shoulder region 09/14/2011    Past Surgical History:  Procedure Laterality Date  . AV FISTULA PLACEMENT  2012      left arm   . AV FISTULA PLACEMENT Left 12/18/2012   Procedure: ARTERIOVENOUS (AV) FISTULA CREATION;  Surgeon: Angelia Mould, MD;  Location: [facility];  Service: Vascular;  Laterality: Left;  . COLONOSCOPY  10/26/2011   Procedure: COLONOSCOPY;  Surgeon: Rogene Houston, MD;  [facility];  Service: Endoscopy;  Laterality: N/A;  730  . EMBOLECTOMY Left 12/09/2012   Procedure: EMBOLECTOMY;  Surgeon: Serafina Mitchell, MD;  Location: [facility] CATH LAB;  Service: Cardiovascular;  Laterality: Left;  left arm venous embolization  . ESOPHAGOGASTRODUODENOSCOPY (EGD) WITH ESOPHAGEAL DILATION N/A 04/23/2013   Procedure: ESOPHAGOGASTRODUODENOSCOPY (EGD) WITH ESOPHAGEAL DILATION;  Surgeon: Rogene Houston, MD;  [facility];  Service: Endoscopy;  Laterality: N/A;  200-moved to 930   . FISTULA SUPERFICIALIZATION Left 06/18/2013   Procedure: FISTULA SUPERFICIALIZATION & LIGATION BRANCH X 1;  Surgeon: Mal Misty, MD;  Location: [facility];  Service: Vascular;  Laterality: Left;  . INGUINAL HERNIA REPAIR     ,  times   2  . INSERTION OF DIALYSIS CATHETER Left 12/18/2012   Procedure: INSERTION OF DIALYSIS  CATHETER;  Surgeon: Angelia Mould, MD;  Location: [facility];  Service: Vascular;  Laterality: Left;  . KNEE ARTHROSCOPY  2011   Right Knee  . REVISON OF ARTERIOVENOUS FISTULA Left 9/32/6712   Procedure: PLICATION OF LEFT BRACHIOCEPHALIC ARTERIOVENOUS FISTULA;  Surgeon: Conrad [place] Gorin, MD;  Location: [facility];  Service: Vascular;  Laterality: Left;  . SHUNTOGRAM N/A 12/09/2012   Procedure: fistulogram;  Surgeon: Serafina Mitchell, MD;  Location: [facility] CATH LAB;  Service: Cardiovascular;  Laterality: N/A;  . SHUNTOGRAM Left  06/03/2013   Procedure: Fistulogram;  Surgeon: Serafina Mitchell, MD;  Location: [facility] CATH LAB;  Service: Cardiovascular;  Laterality: Left;  . THROMBECTOMY W/ EMBOLECTOMY Left 12/11/2012   Procedure: THROMBECTOMY ARTERIOVENOUS FISTULA;  Surgeon: Serafina Mitchell, MD;  Location: [facility];  Service: Vascular;  Laterality: Left;  . TONSILLECTOMY         Home Medications    Prior to Admission medications   Medication Sig Start Date End Date Taking? Authorizing Provider  atorvastatin (LIPITOR) 80 MG tablet Take 80 mg by mouth at bedtime. 08/10/15  Yes Historical Provider, MD  B Complex-C-Zn-Folic Acid (DIALYVITE/ZINC PO) Take 1 tablet by mouth at bedtime.    Yes Historical Provider, MD  carvedilol (COREG) 12.5 MG tablet Take 12.5 mg by mouth every evening.    Yes Historical Provider, MD  cinacalcet (SENSIPAR) 30 MG tablet Take 30 mg by mouth daily with supper.   Yes Historical Provider, MD  diazepam (VALIUM) 5 MG tablet Take 5 mg by mouth 2 (two) times daily.    Yes Historical Provider, MD  DULoxetine (CYMBALTA) 60 MG capsule Take 60 mg by mouth at bedtime. Reported on 03/01/2016 08/10/15  Yes Historical Provider, MD  FLUoxetine (PROZAC) 40 MG capsule Take 40 mg by mouth at bedtime. 07/11/15  Yes Historical Provider, MD  gabapentin (NEURONTIN) 300 MG capsule Take 600 mg by mouth 3 (three) times daily.  07/05/15  Yes Historical Provider, MD  insulin aspart (NOVOLOG) 100 UNIT/ML injection Inject 1-10 Units into the skin 3 (three) times daily before meals. Based on blood sugar levels   Yes Historical Provider, MD  lanthanum (FOSRENOL) 1000 MG chewable tablet Chew 1,000 mg by mouth 2 (two) times daily with a meal.    Yes Historical Provider, MD  levothyroxine (SYNTHROID, LEVOTHROID) 150 MCG tablet Take 150 mcg by mouth daily before breakfast.   Yes Historical Provider, MD  Melatonin 10 MG TABS Take 10 mg by mouth at bedtime.   Yes Historical Provider, MD  polyethylene glycol (MIRALAX / GLYCOLAX) packet Take  17 g by mouth daily.    Yes Historical Provider, MD  polyvinyl alcohol (LIQUIFILM TEARS) 1.4 % ophthalmic solution Place 1-2 drops into both eyes as needed (for dry eyes).   Yes Historical Provider, MD  Probiotic Product ([place]) Take 1 capsule by mouth daily as needed (constipation).   Yes Historical Provider, MD  ranitidine (ZANTAC) 150 MG tablet Take 150 mg by mouth 2 (two) times daily.    Yes Historical Provider, MD  RENVELA 800 MG tablet Take 3,200 mg by mouth 3 (three) times daily with meals. Take 3200 mg also along with snacks 07/25/15  Yes Historical Provider, MD  silver sulfADIAZINE (SILVADENE) 1 % cream Apply 1 application topically daily.   Yes Historical Provider, MD  amoxicillin-clavulanate (AUGMENTIN) 875-125 MG tablet Take 1 tablet by mouth 2 (two) times daily. One po bid x 7 days 06/18/16   Daleen Bo, MD  oxyCODONE-acetaminophen (ROXICET)  5-325 MG tablet Take 1 tablet by mouth every 6 (six) hours as needed. Patient not taking: Reported on 06/18/2016 03/06/16   Alvia Grove, PA-C  predniSONE (DELTASONE) 10 MG tablet Take q day 6,5,4,3,2,1 06/18/16   Daleen Bo, MD    Family History Family History  Problem Relation Age of Onset  . Heart disease Father     Heart Disease before age 86    Social History Social History  Substance Use Topics  . Smoking status: Former Smoker    Years: 10.00    Types: Cigars    Quit date: 03/13/2012  . Smokeless tobacco: Never Used  . Alcohol use No     Allergies   Morphine and related and Zaroxolyn [metolazone]   Review of Systems Review of Systems  All other systems reviewed and are negative.    Physical Exam Updated Vital Signs BP 152/73   Pulse 96   Temp 98.4 F (36.9 C) (Oral)   Resp 19   Ht 6\' 4"  (1.93 m)   Wt 250 lb (113.4 kg)   SpO2 98%   BMI 30.43 kg/m   Physical Exam  Constitutional: He is oriented to person, place, and time. He appears well-developed.  Appears older than stated age.    HENT:  Head: Normocephalic and atraumatic.  Right Ear: External ear normal.  Left Ear: External ear normal.  Eyes: Conjunctivae and EOM are normal. Pupils are equal, round, and reactive to light.  Neck: Normal range of motion and phonation normal. Neck supple.  Cardiovascular: Normal rate, regular rhythm and normal heart sounds.   Pulmonary/Chest: Effort normal. He exhibits no bony tenderness.  Tachypnea. Mild rhonchi with auscultation. Audible upper airway noise, without stethoscope. Air movement good, with auscultation.  Abdominal: Soft. There is no tenderness.  Musculoskeletal: Normal range of motion.  Neurological: He is alert and oriented to person, place, and time. No cranial nerve deficit or sensory deficit. He exhibits normal muscle tone. Coordination normal.  Skin: Skin is warm, dry and intact.  Psychiatric: He has a normal mood and affect. His behavior is normal. Judgment and thought content normal.  Nursing note and vitals reviewed.    ED Treatments / Results  Labs (all labs ordered are listed, but only abnormal results are displayed) Labs Reviewed  BASIC METABOLIC PANEL - Abnormal; Notable for the following:       Result Value   Chloride 96 (*)    Glucose, Bld 141 (*)    BUN 36 (*)    Creatinine, Ser 5.18 (*)    GFR calc non Af Amer 10 (*)    GFR calc Af Amer 12 (*)    All other components within normal limits  CBC WITH DIFFERENTIAL/PLATELET - Abnormal; Notable for the following:    RBC 3.35 (*)    Hemoglobin 10.9 (*)    HCT 32.5 (*)    RDW 16.2 (*)    All other components within normal limits  CBC    EKG  EKG Interpretation  Date/Time:  Monday June 18 2016 16:02:20 EST Ventricular Rate:  85 PR Interval:    QRS Duration: 160 QT Interval:  421 QTC Calculation: 501 R Axis:   62 Text Interpretation:  Sinus rhythm Probable left atrial enlargement Right bundle branch block since last tracing no significant change Confirmed by Eulis Foster  MD, Alyissa Whidbee [phone])  on 06/18/2016 4:12:00 PM       Radiology Dg Chest 2 View  Result Date: 06/18/2016 CLINICAL DATA:  Wheezing and cough  and shortness of breath for the past 4 days. History of COPD, chronic renal insufficiency, hypertension, diabetes. EXAM: CHEST  2 VIEW COMPARISON:  Portable chest x-ray of March 27, 2016 FINDINGS: The lungs are well-expanded. The interstitial markings are mildly prominent. The cardiac silhouette is enlarged. The central pulmonary vascularity is prominent but not greatly changed. There is no alveolar infiltrate, pleural effusion, or pneumothorax. There is calcification in the wall of the aortic arch. The trachea is midline. The bony thorax is unremarkable. IMPRESSION: Mild interstitial prominence likely reflect interstitial edema secondary to CHF. Electronically Signed   By: David  Martinique M.D.   On: 06/18/2016 16:43    Procedures Procedures (including critical care time)  Medications Ordered in ED Medications  albuterol (PROVENTIL) (2.5 MG/3ML) 0.083% nebulizer solution 5 mg (5 mg Nebulization Given 06/18/16 1716)  predniSONE (DELTASONE) tablet 60 mg (60 mg Oral Given 06/18/16 1841)  amoxicillin-clavulanate (AUGMENTIN) 875-125 MG per tablet 1 tablet (1 tablet Oral Given 06/18/16 1841)  acetaminophen (TYLENOL) tablet 650 mg (650 mg Oral Given 06/18/16 1859)  albuterol (PROVENTIL) (2.5 MG/3ML) 0.083% nebulizer solution 5 mg (5 mg Nebulization Given 06/18/16 1900)     Initial Impression / Assessment and Plan / ED Course  I have reviewed the triage vital signs and the nursing notes.  Pertinent labs & imaging results that were available during my care of the patient were reviewed by me and considered in my medical decision making (see chart for details).  Clinical Course as of Jun 19 2039  [facility] Jun 18, 2016  1801 Potassium: 3.7 [EW]    Clinical Course User Index [EW] Daleen Bo, MD    Medications  albuterol (PROVENTIL) (2.5 MG/3ML) 0.083% nebulizer solution 5 mg (5 mg  Nebulization Given 06/18/16 1716)  predniSONE (DELTASONE) tablet 60 mg (60 mg Oral Given 06/18/16 1841)  amoxicillin-clavulanate (AUGMENTIN) 875-125 MG per tablet 1 tablet (1 tablet Oral Given 06/18/16 1841)  acetaminophen (TYLENOL) tablet 650 mg (650 mg Oral Given 06/18/16 1859)  albuterol (PROVENTIL) (2.5 MG/3ML) 0.083% nebulizer solution 5 mg (5 mg Nebulization Given 06/18/16 1900)    Patient Vitals for the past 24 hrs:  BP Temp Temp src Pulse Resp SpO2 Height Weight  06/18/16 1930 152/73 - - 96 19 98 % - -  06/18/16 1915 - - - 88 16 97 % - -  06/18/16 1900 132/66 - - 88 - 93 % - -  06/18/16 1845 - - - 85 - 93 % - -  06/18/16 1830 131/64 - - - - - - -  06/18/16 1815 - - - 84 - 98 % - -  06/18/16 1800 133/75 - - 85 - 97 % - -  06/18/16 1745 - - - 84 - 100 % - -  06/18/16 1730 141/80 - - 85 - 99 % - -  06/18/16 1719 - - - - - 96 % - -  06/18/16 1715 - - - 82 - 97 % - -  06/18/16 1700 122/72 - - 82 - 97 % - -  06/18/16 1645 - - - 83 - 98 % - -  06/18/16 1615 - - - 84 26 94 % - -  06/18/16 1605 - - - - - 97 % 6\' 4"  (1.93 m) 250 lb (113.4 kg)  06/18/16 1604 113/67 98.4 F (36.9 C) Oral 84 21 95 % - -    8:40 PM Reevaluation with update and discussion. After initial assessment and treatment, an updated evaluation reveals He states he feels better  after the second nebulizer. Findings discussed with patient and wife, all questions answered. Avaree Gilberti L    Final Clinical Impressions(s) / ED Diagnoses   Final diagnoses:  Bronchitis   Cough with sputum production, and clinical findings consistent with bronchitis. Doubt pneumonia, significant fluid overload, metabolic instability or impending vascular collapse.  Nursing Notes Reviewed/ Care Coordinated Applicable Imaging Reviewed Interpretation of Laboratory Data incorporated into ED treatment  The patient appears reasonably screened and/or stabilized for discharge and I doubt any other medical condition or other [facility] requiring further  screening, evaluation, or treatment in the ED at this time prior to discharge.  Plan: Home Medications- continue; Home Treatments- rest; return here if the recommended treatment, does not improve the symptoms; Recommended follow up- PCP on 06/21/16, as scheduled    New Prescriptions New Prescriptions   AMOXICILLIN-CLAVULANATE (AUGMENTIN) 875-125 MG TABLET    Take 1 tablet by mouth 2 (two) times daily. One po bid x 7 days   PREDNISONE (DELTASONE) 10 MG TABLET    Take q day 6,5,4,3,2,1     Daleen Bo, MD 06/18/16 2041

## 2016-06-19 DIAGNOSIS — E1142 Type 2 diabetes mellitus with diabetic polyneuropathy: Secondary | ICD-10-CM | POA: Diagnosis not present

## 2016-06-19 DIAGNOSIS — I120 Hypertensive chronic kidney disease with stage 5 chronic kidney disease or end stage renal disease: Secondary | ICD-10-CM | POA: Diagnosis not present

## 2016-06-19 DIAGNOSIS — E1122 Type 2 diabetes mellitus with diabetic chronic kidney disease: Secondary | ICD-10-CM | POA: Diagnosis not present

## 2016-06-19 DIAGNOSIS — J449 Chronic obstructive pulmonary disease, unspecified: Secondary | ICD-10-CM | POA: Diagnosis not present

## 2016-06-19 DIAGNOSIS — N185 Chronic kidney disease, stage 5: Secondary | ICD-10-CM | POA: Diagnosis not present

## 2016-06-19 DIAGNOSIS — Z8673 Personal history of transient ischemic attack (TIA), and cerebral infarction without residual deficits: Secondary | ICD-10-CM | POA: Diagnosis not present

## 2016-06-20 DIAGNOSIS — E1142 Type 2 diabetes mellitus with diabetic polyneuropathy: Secondary | ICD-10-CM | POA: Diagnosis not present

## 2016-06-20 DIAGNOSIS — D631 Anemia in chronic kidney disease: Secondary | ICD-10-CM | POA: Diagnosis not present

## 2016-06-20 DIAGNOSIS — E668 Other obesity: Secondary | ICD-10-CM | POA: Diagnosis not present

## 2016-06-20 DIAGNOSIS — M6281 Muscle weakness (generalized): Secondary | ICD-10-CM | POA: Diagnosis not present

## 2016-06-20 DIAGNOSIS — I700 Atherosclerosis of aorta: Secondary | ICD-10-CM | POA: Insufficient documentation

## 2016-06-20 DIAGNOSIS — R2689 Other abnormalities of gait and mobility: Secondary | ICD-10-CM | POA: Diagnosis not present

## 2016-06-20 DIAGNOSIS — Z9181 History of falling: Secondary | ICD-10-CM | POA: Diagnosis not present

## 2016-06-20 DIAGNOSIS — N2581 Secondary hyperparathyroidism of renal origin: Secondary | ICD-10-CM | POA: Diagnosis not present

## 2016-06-20 DIAGNOSIS — N185 Chronic kidney disease, stage 5: Secondary | ICD-10-CM | POA: Diagnosis not present

## 2016-06-20 DIAGNOSIS — R278 Other lack of coordination: Secondary | ICD-10-CM | POA: Diagnosis not present

## 2016-06-20 DIAGNOSIS — Z8673 Personal history of transient ischemic attack (TIA), and cerebral infarction without residual deficits: Secondary | ICD-10-CM | POA: Diagnosis not present

## 2016-06-20 DIAGNOSIS — E039 Hypothyroidism, unspecified: Secondary | ICD-10-CM | POA: Insufficient documentation

## 2016-06-20 DIAGNOSIS — E1129 Type 2 diabetes mellitus with other diabetic kidney complication: Secondary | ICD-10-CM | POA: Diagnosis not present

## 2016-06-20 DIAGNOSIS — I120 Hypertensive chronic kidney disease with stage 5 chronic kidney disease or end stage renal disease: Secondary | ICD-10-CM | POA: Diagnosis not present

## 2016-06-20 DIAGNOSIS — J449 Chronic obstructive pulmonary disease, unspecified: Secondary | ICD-10-CM | POA: Diagnosis not present

## 2016-06-20 DIAGNOSIS — N186 End stage renal disease: Secondary | ICD-10-CM | POA: Diagnosis not present

## 2016-06-20 DIAGNOSIS — E1122 Type 2 diabetes mellitus with diabetic chronic kidney disease: Secondary | ICD-10-CM | POA: Diagnosis not present

## 2016-06-21 DIAGNOSIS — E1122 Type 2 diabetes mellitus with diabetic chronic kidney disease: Secondary | ICD-10-CM | POA: Diagnosis not present

## 2016-06-21 DIAGNOSIS — Z8673 Personal history of transient ischemic attack (TIA), and cerebral infarction without residual deficits: Secondary | ICD-10-CM | POA: Diagnosis not present

## 2016-06-21 DIAGNOSIS — E039 Hypothyroidism, unspecified: Secondary | ICD-10-CM | POA: Diagnosis not present

## 2016-06-21 DIAGNOSIS — G6289 Other specified polyneuropathies: Secondary | ICD-10-CM | POA: Diagnosis not present

## 2016-06-21 DIAGNOSIS — N185 Chronic kidney disease, stage 5: Secondary | ICD-10-CM | POA: Diagnosis not present

## 2016-06-21 DIAGNOSIS — I10 Essential (primary) hypertension: Secondary | ICD-10-CM | POA: Diagnosis not present

## 2016-06-21 DIAGNOSIS — E1142 Type 2 diabetes mellitus with diabetic polyneuropathy: Secondary | ICD-10-CM | POA: Diagnosis not present

## 2016-06-21 DIAGNOSIS — J449 Chronic obstructive pulmonary disease, unspecified: Secondary | ICD-10-CM | POA: Diagnosis not present

## 2016-06-21 DIAGNOSIS — I120 Hypertensive chronic kidney disease with stage 5 chronic kidney disease or end stage renal disease: Secondary | ICD-10-CM | POA: Diagnosis not present

## 2016-06-21 DIAGNOSIS — I700 Atherosclerosis of aorta: Secondary | ICD-10-CM | POA: Diagnosis not present

## 2016-06-21 DIAGNOSIS — Z6832 Body mass index (BMI) 32.0-32.9, adult: Secondary | ICD-10-CM | POA: Diagnosis not present

## 2016-06-22 DIAGNOSIS — N186 End stage renal disease: Secondary | ICD-10-CM | POA: Diagnosis not present

## 2016-06-22 DIAGNOSIS — N2581 Secondary hyperparathyroidism of renal origin: Secondary | ICD-10-CM | POA: Diagnosis not present

## 2016-06-22 DIAGNOSIS — N185 Chronic kidney disease, stage 5: Secondary | ICD-10-CM | POA: Diagnosis not present

## 2016-06-22 DIAGNOSIS — I120 Hypertensive chronic kidney disease with stage 5 chronic kidney disease or end stage renal disease: Secondary | ICD-10-CM | POA: Diagnosis not present

## 2016-06-22 DIAGNOSIS — Z8673 Personal history of transient ischemic attack (TIA), and cerebral infarction without residual deficits: Secondary | ICD-10-CM | POA: Diagnosis not present

## 2016-06-22 DIAGNOSIS — E1142 Type 2 diabetes mellitus with diabetic polyneuropathy: Secondary | ICD-10-CM | POA: Diagnosis not present

## 2016-06-22 DIAGNOSIS — E1122 Type 2 diabetes mellitus with diabetic chronic kidney disease: Secondary | ICD-10-CM | POA: Diagnosis not present

## 2016-06-22 DIAGNOSIS — D631 Anemia in chronic kidney disease: Secondary | ICD-10-CM | POA: Diagnosis not present

## 2016-06-22 DIAGNOSIS — E1129 Type 2 diabetes mellitus with other diabetic kidney complication: Secondary | ICD-10-CM | POA: Diagnosis not present

## 2016-06-22 DIAGNOSIS — J449 Chronic obstructive pulmonary disease, unspecified: Secondary | ICD-10-CM | POA: Diagnosis not present

## 2016-06-25 DIAGNOSIS — N2581 Secondary hyperparathyroidism of renal origin: Secondary | ICD-10-CM | POA: Diagnosis not present

## 2016-06-25 DIAGNOSIS — D631 Anemia in chronic kidney disease: Secondary | ICD-10-CM | POA: Diagnosis not present

## 2016-06-25 DIAGNOSIS — E1129 Type 2 diabetes mellitus with other diabetic kidney complication: Secondary | ICD-10-CM | POA: Diagnosis not present

## 2016-06-25 DIAGNOSIS — N186 End stage renal disease: Secondary | ICD-10-CM | POA: Diagnosis not present

## 2016-06-26 DIAGNOSIS — R4182 Altered mental status, unspecified: Secondary | ICD-10-CM | POA: Diagnosis not present

## 2016-06-26 DIAGNOSIS — E1065 Type 1 diabetes mellitus with hyperglycemia: Secondary | ICD-10-CM | POA: Diagnosis not present

## 2016-06-26 DIAGNOSIS — E1122 Type 2 diabetes mellitus with diabetic chronic kidney disease: Secondary | ICD-10-CM | POA: Diagnosis not present

## 2016-06-26 DIAGNOSIS — N185 Chronic kidney disease, stage 5: Secondary | ICD-10-CM | POA: Diagnosis not present

## 2016-06-26 DIAGNOSIS — F411 Generalized anxiety disorder: Secondary | ICD-10-CM | POA: Diagnosis not present

## 2016-06-26 DIAGNOSIS — R748 Abnormal levels of other serum enzymes: Secondary | ICD-10-CM | POA: Diagnosis not present

## 2016-06-26 DIAGNOSIS — Z885 Allergy status to narcotic agent status: Secondary | ICD-10-CM | POA: Diagnosis not present

## 2016-06-26 DIAGNOSIS — I120 Hypertensive chronic kidney disease with stage 5 chronic kidney disease or end stage renal disease: Secondary | ICD-10-CM | POA: Diagnosis not present

## 2016-06-26 DIAGNOSIS — Z8673 Personal history of transient ischemic attack (TIA), and cerebral infarction without residual deficits: Secondary | ICD-10-CM | POA: Diagnosis not present

## 2016-06-26 DIAGNOSIS — R030 Elevated blood-pressure reading, without diagnosis of hypertension: Secondary | ICD-10-CM | POA: Diagnosis not present

## 2016-06-26 DIAGNOSIS — Z79899 Other long term (current) drug therapy: Secondary | ICD-10-CM | POA: Diagnosis not present

## 2016-06-26 DIAGNOSIS — J449 Chronic obstructive pulmonary disease, unspecified: Secondary | ICD-10-CM | POA: Diagnosis not present

## 2016-06-26 DIAGNOSIS — G4700 Insomnia, unspecified: Secondary | ICD-10-CM | POA: Diagnosis not present

## 2016-06-26 DIAGNOSIS — R749 Abnormal serum enzyme level, unspecified: Secondary | ICD-10-CM | POA: Diagnosis not present

## 2016-06-26 DIAGNOSIS — E785 Hyperlipidemia, unspecified: Secondary | ICD-10-CM | POA: Diagnosis not present

## 2016-06-26 DIAGNOSIS — Z992 Dependence on renal dialysis: Secondary | ICD-10-CM | POA: Diagnosis not present

## 2016-06-26 DIAGNOSIS — E11649 Type 2 diabetes mellitus with hypoglycemia without coma: Secondary | ICD-10-CM | POA: Diagnosis not present

## 2016-06-26 DIAGNOSIS — R531 Weakness: Secondary | ICD-10-CM | POA: Diagnosis not present

## 2016-06-26 DIAGNOSIS — N186 End stage renal disease: Secondary | ICD-10-CM | POA: Diagnosis not present

## 2016-06-26 DIAGNOSIS — E1142 Type 2 diabetes mellitus with diabetic polyneuropathy: Secondary | ICD-10-CM | POA: Diagnosis not present

## 2016-06-26 DIAGNOSIS — K5909 Other constipation: Secondary | ICD-10-CM | POA: Diagnosis not present

## 2016-06-26 DIAGNOSIS — K219 Gastro-esophageal reflux disease without esophagitis: Secondary | ICD-10-CM | POA: Diagnosis not present

## 2016-06-26 DIAGNOSIS — F329 Major depressive disorder, single episode, unspecified: Secondary | ICD-10-CM | POA: Diagnosis not present

## 2016-06-26 DIAGNOSIS — E039 Hypothyroidism, unspecified: Secondary | ICD-10-CM | POA: Diagnosis not present

## 2016-06-27 DIAGNOSIS — N186 End stage renal disease: Secondary | ICD-10-CM | POA: Diagnosis not present

## 2016-06-27 DIAGNOSIS — E1129 Type 2 diabetes mellitus with other diabetic kidney complication: Secondary | ICD-10-CM | POA: Diagnosis not present

## 2016-06-27 DIAGNOSIS — E1122 Type 2 diabetes mellitus with diabetic chronic kidney disease: Secondary | ICD-10-CM | POA: Diagnosis not present

## 2016-06-27 DIAGNOSIS — R4182 Altered mental status, unspecified: Secondary | ICD-10-CM | POA: Diagnosis not present

## 2016-06-27 DIAGNOSIS — I10 Essential (primary) hypertension: Secondary | ICD-10-CM | POA: Diagnosis not present

## 2016-06-27 DIAGNOSIS — E11649 Type 2 diabetes mellitus with hypoglycemia without coma: Secondary | ICD-10-CM | POA: Diagnosis not present

## 2016-06-27 DIAGNOSIS — R410 Disorientation, unspecified: Secondary | ICD-10-CM | POA: Diagnosis not present

## 2016-06-27 DIAGNOSIS — I120 Hypertensive chronic kidney disease with stage 5 chronic kidney disease or end stage renal disease: Secondary | ICD-10-CM | POA: Diagnosis not present

## 2016-06-27 DIAGNOSIS — Z992 Dependence on renal dialysis: Secondary | ICD-10-CM | POA: Diagnosis not present

## 2016-06-27 DIAGNOSIS — R748 Abnormal levels of other serum enzymes: Secondary | ICD-10-CM | POA: Diagnosis not present

## 2016-06-27 DIAGNOSIS — R749 Abnormal serum enzyme level, unspecified: Secondary | ICD-10-CM | POA: Diagnosis not present

## 2016-06-28 DIAGNOSIS — R749 Abnormal serum enzyme level, unspecified: Secondary | ICD-10-CM | POA: Diagnosis not present

## 2016-06-28 DIAGNOSIS — I120 Hypertensive chronic kidney disease with stage 5 chronic kidney disease or end stage renal disease: Secondary | ICD-10-CM | POA: Diagnosis not present

## 2016-06-28 DIAGNOSIS — Z992 Dependence on renal dialysis: Secondary | ICD-10-CM | POA: Diagnosis not present

## 2016-06-28 DIAGNOSIS — N186 End stage renal disease: Secondary | ICD-10-CM | POA: Diagnosis not present

## 2016-06-28 DIAGNOSIS — R748 Abnormal levels of other serum enzymes: Secondary | ICD-10-CM | POA: Diagnosis not present

## 2016-06-28 DIAGNOSIS — E11649 Type 2 diabetes mellitus with hypoglycemia without coma: Secondary | ICD-10-CM | POA: Diagnosis not present

## 2016-06-28 DIAGNOSIS — E1122 Type 2 diabetes mellitus with diabetic chronic kidney disease: Secondary | ICD-10-CM | POA: Diagnosis not present

## 2016-06-28 DIAGNOSIS — R4182 Altered mental status, unspecified: Secondary | ICD-10-CM | POA: Diagnosis not present

## 2016-06-29 DIAGNOSIS — D631 Anemia in chronic kidney disease: Secondary | ICD-10-CM | POA: Diagnosis not present

## 2016-06-29 DIAGNOSIS — I120 Hypertensive chronic kidney disease with stage 5 chronic kidney disease or end stage renal disease: Secondary | ICD-10-CM | POA: Diagnosis not present

## 2016-06-29 DIAGNOSIS — N185 Chronic kidney disease, stage 5: Secondary | ICD-10-CM | POA: Diagnosis not present

## 2016-06-29 DIAGNOSIS — J449 Chronic obstructive pulmonary disease, unspecified: Secondary | ICD-10-CM | POA: Diagnosis not present

## 2016-06-29 DIAGNOSIS — N186 End stage renal disease: Secondary | ICD-10-CM | POA: Diagnosis not present

## 2016-06-29 DIAGNOSIS — N2581 Secondary hyperparathyroidism of renal origin: Secondary | ICD-10-CM | POA: Diagnosis not present

## 2016-06-29 DIAGNOSIS — E1122 Type 2 diabetes mellitus with diabetic chronic kidney disease: Secondary | ICD-10-CM | POA: Diagnosis not present

## 2016-06-29 DIAGNOSIS — E1142 Type 2 diabetes mellitus with diabetic polyneuropathy: Secondary | ICD-10-CM | POA: Diagnosis not present

## 2016-06-29 DIAGNOSIS — Z8673 Personal history of transient ischemic attack (TIA), and cerebral infarction without residual deficits: Secondary | ICD-10-CM | POA: Diagnosis not present

## 2016-06-29 DIAGNOSIS — E1129 Type 2 diabetes mellitus with other diabetic kidney complication: Secondary | ICD-10-CM | POA: Diagnosis not present

## 2016-07-02 DIAGNOSIS — N2581 Secondary hyperparathyroidism of renal origin: Secondary | ICD-10-CM | POA: Diagnosis not present

## 2016-07-02 DIAGNOSIS — D631 Anemia in chronic kidney disease: Secondary | ICD-10-CM | POA: Diagnosis not present

## 2016-07-02 DIAGNOSIS — E1129 Type 2 diabetes mellitus with other diabetic kidney complication: Secondary | ICD-10-CM | POA: Diagnosis not present

## 2016-07-02 DIAGNOSIS — N186 End stage renal disease: Secondary | ICD-10-CM | POA: Diagnosis not present

## 2016-07-03 DIAGNOSIS — N185 Chronic kidney disease, stage 5: Secondary | ICD-10-CM | POA: Diagnosis not present

## 2016-07-03 DIAGNOSIS — Z8673 Personal history of transient ischemic attack (TIA), and cerebral infarction without residual deficits: Secondary | ICD-10-CM | POA: Diagnosis not present

## 2016-07-03 DIAGNOSIS — I120 Hypertensive chronic kidney disease with stage 5 chronic kidney disease or end stage renal disease: Secondary | ICD-10-CM | POA: Diagnosis not present

## 2016-07-03 DIAGNOSIS — E1122 Type 2 diabetes mellitus with diabetic chronic kidney disease: Secondary | ICD-10-CM | POA: Diagnosis not present

## 2016-07-03 DIAGNOSIS — J449 Chronic obstructive pulmonary disease, unspecified: Secondary | ICD-10-CM | POA: Diagnosis not present

## 2016-07-03 DIAGNOSIS — E1142 Type 2 diabetes mellitus with diabetic polyneuropathy: Secondary | ICD-10-CM | POA: Diagnosis not present

## 2016-07-04 DIAGNOSIS — D631 Anemia in chronic kidney disease: Secondary | ICD-10-CM | POA: Diagnosis not present

## 2016-07-04 DIAGNOSIS — E1129 Type 2 diabetes mellitus with other diabetic kidney complication: Secondary | ICD-10-CM | POA: Diagnosis not present

## 2016-07-04 DIAGNOSIS — N2581 Secondary hyperparathyroidism of renal origin: Secondary | ICD-10-CM | POA: Diagnosis not present

## 2016-07-04 DIAGNOSIS — N186 End stage renal disease: Secondary | ICD-10-CM | POA: Diagnosis not present

## 2016-07-06 DIAGNOSIS — N2581 Secondary hyperparathyroidism of renal origin: Secondary | ICD-10-CM | POA: Diagnosis not present

## 2016-07-06 DIAGNOSIS — D631 Anemia in chronic kidney disease: Secondary | ICD-10-CM | POA: Diagnosis not present

## 2016-07-06 DIAGNOSIS — N186 End stage renal disease: Secondary | ICD-10-CM | POA: Diagnosis not present

## 2016-07-06 DIAGNOSIS — E1129 Type 2 diabetes mellitus with other diabetic kidney complication: Secondary | ICD-10-CM | POA: Diagnosis not present

## 2016-07-06 MED FILL — UNIFINE PENTIPS 32GX5/32": 32G X 4 MM | 90 days supply | Qty: 100 | Fill #3

## 2016-07-06 MED FILL — SENSIPAR 60 MG TABLET: 60 | 30 days supply | Qty: 30 | Fill #2

## 2016-07-06 MED FILL — UNIFINE PENTIPS 32GX5/32: 32G X 4 MM | 90 days supply | Qty: 100 | Fill #3

## 2016-07-06 MED FILL — TOUJEO SOLOSTAR 300 UNITS/M: 300 | 81 days supply | Qty: 27 | Fill #0

## 2016-07-09 DIAGNOSIS — N2581 Secondary hyperparathyroidism of renal origin: Secondary | ICD-10-CM | POA: Diagnosis not present

## 2016-07-09 DIAGNOSIS — D631 Anemia in chronic kidney disease: Secondary | ICD-10-CM | POA: Diagnosis not present

## 2016-07-09 DIAGNOSIS — N186 End stage renal disease: Secondary | ICD-10-CM | POA: Diagnosis not present

## 2016-07-09 DIAGNOSIS — E1129 Type 2 diabetes mellitus with other diabetic kidney complication: Secondary | ICD-10-CM | POA: Diagnosis not present

## 2016-07-10 DIAGNOSIS — I120 Hypertensive chronic kidney disease with stage 5 chronic kidney disease or end stage renal disease: Secondary | ICD-10-CM | POA: Diagnosis not present

## 2016-07-10 DIAGNOSIS — E1142 Type 2 diabetes mellitus with diabetic polyneuropathy: Secondary | ICD-10-CM | POA: Diagnosis not present

## 2016-07-10 DIAGNOSIS — Z8673 Personal history of transient ischemic attack (TIA), and cerebral infarction without residual deficits: Secondary | ICD-10-CM | POA: Diagnosis not present

## 2016-07-10 DIAGNOSIS — E1122 Type 2 diabetes mellitus with diabetic chronic kidney disease: Secondary | ICD-10-CM | POA: Diagnosis not present

## 2016-07-10 DIAGNOSIS — N185 Chronic kidney disease, stage 5: Secondary | ICD-10-CM | POA: Diagnosis not present

## 2016-07-10 DIAGNOSIS — J449 Chronic obstructive pulmonary disease, unspecified: Secondary | ICD-10-CM | POA: Diagnosis not present

## 2016-07-11 DIAGNOSIS — N186 End stage renal disease: Secondary | ICD-10-CM | POA: Diagnosis not present

## 2016-07-11 DIAGNOSIS — D631 Anemia in chronic kidney disease: Secondary | ICD-10-CM | POA: Diagnosis not present

## 2016-07-11 DIAGNOSIS — E1129 Type 2 diabetes mellitus with other diabetic kidney complication: Secondary | ICD-10-CM | POA: Diagnosis not present

## 2016-07-11 DIAGNOSIS — N2581 Secondary hyperparathyroidism of renal origin: Secondary | ICD-10-CM | POA: Diagnosis not present

## 2016-07-12 DIAGNOSIS — I120 Hypertensive chronic kidney disease with stage 5 chronic kidney disease or end stage renal disease: Secondary | ICD-10-CM | POA: Diagnosis not present

## 2016-07-12 DIAGNOSIS — Z8673 Personal history of transient ischemic attack (TIA), and cerebral infarction without residual deficits: Secondary | ICD-10-CM | POA: Diagnosis not present

## 2016-07-12 DIAGNOSIS — N185 Chronic kidney disease, stage 5: Secondary | ICD-10-CM | POA: Diagnosis not present

## 2016-07-12 DIAGNOSIS — E1122 Type 2 diabetes mellitus with diabetic chronic kidney disease: Secondary | ICD-10-CM | POA: Diagnosis not present

## 2016-07-12 DIAGNOSIS — E1129 Type 2 diabetes mellitus with other diabetic kidney complication: Secondary | ICD-10-CM | POA: Diagnosis not present

## 2016-07-12 DIAGNOSIS — E1142 Type 2 diabetes mellitus with diabetic polyneuropathy: Secondary | ICD-10-CM | POA: Diagnosis not present

## 2016-07-12 DIAGNOSIS — J449 Chronic obstructive pulmonary disease, unspecified: Secondary | ICD-10-CM | POA: Diagnosis not present

## 2016-07-12 DIAGNOSIS — Z992 Dependence on renal dialysis: Secondary | ICD-10-CM | POA: Diagnosis not present

## 2016-07-12 DIAGNOSIS — N186 End stage renal disease: Secondary | ICD-10-CM | POA: Diagnosis not present

## 2016-07-13 DIAGNOSIS — N186 End stage renal disease: Secondary | ICD-10-CM | POA: Diagnosis not present

## 2016-07-13 DIAGNOSIS — E211 Secondary hyperparathyroidism, not elsewhere classified: Secondary | ICD-10-CM | POA: Diagnosis not present

## 2016-07-13 DIAGNOSIS — D631 Anemia in chronic kidney disease: Secondary | ICD-10-CM | POA: Diagnosis not present

## 2016-07-13 DIAGNOSIS — N2581 Secondary hyperparathyroidism of renal origin: Secondary | ICD-10-CM | POA: Diagnosis not present

## 2016-07-16 DIAGNOSIS — N2581 Secondary hyperparathyroidism of renal origin: Secondary | ICD-10-CM | POA: Diagnosis not present

## 2016-07-16 DIAGNOSIS — D631 Anemia in chronic kidney disease: Secondary | ICD-10-CM | POA: Diagnosis not present

## 2016-07-16 DIAGNOSIS — N186 End stage renal disease: Secondary | ICD-10-CM | POA: Diagnosis not present

## 2016-07-16 DIAGNOSIS — E211 Secondary hyperparathyroidism, not elsewhere classified: Secondary | ICD-10-CM | POA: Diagnosis not present

## 2016-07-17 DIAGNOSIS — E668 Other obesity: Secondary | ICD-10-CM | POA: Diagnosis not present

## 2016-07-17 DIAGNOSIS — N186 End stage renal disease: Secondary | ICD-10-CM | POA: Diagnosis not present

## 2016-07-18 DIAGNOSIS — D631 Anemia in chronic kidney disease: Secondary | ICD-10-CM | POA: Diagnosis not present

## 2016-07-18 DIAGNOSIS — E211 Secondary hyperparathyroidism, not elsewhere classified: Secondary | ICD-10-CM | POA: Diagnosis not present

## 2016-07-18 DIAGNOSIS — N186 End stage renal disease: Secondary | ICD-10-CM | POA: Diagnosis not present

## 2016-07-18 DIAGNOSIS — N2581 Secondary hyperparathyroidism of renal origin: Secondary | ICD-10-CM | POA: Diagnosis not present

## 2016-07-19 DIAGNOSIS — N186 End stage renal disease: Secondary | ICD-10-CM | POA: Diagnosis not present

## 2016-07-19 DIAGNOSIS — Z992 Dependence on renal dialysis: Secondary | ICD-10-CM | POA: Diagnosis not present

## 2016-07-19 DIAGNOSIS — E1142 Type 2 diabetes mellitus with diabetic polyneuropathy: Secondary | ICD-10-CM | POA: Diagnosis not present

## 2016-07-19 DIAGNOSIS — I120 Hypertensive chronic kidney disease with stage 5 chronic kidney disease or end stage renal disease: Secondary | ICD-10-CM | POA: Diagnosis not present

## 2016-07-20 DIAGNOSIS — D631 Anemia in chronic kidney disease: Secondary | ICD-10-CM | POA: Diagnosis not present

## 2016-07-20 DIAGNOSIS — E211 Secondary hyperparathyroidism, not elsewhere classified: Secondary | ICD-10-CM | POA: Diagnosis not present

## 2016-07-20 DIAGNOSIS — N2581 Secondary hyperparathyroidism of renal origin: Secondary | ICD-10-CM | POA: Diagnosis not present

## 2016-07-20 DIAGNOSIS — N186 End stage renal disease: Secondary | ICD-10-CM | POA: Diagnosis not present

## 2016-07-23 DIAGNOSIS — D631 Anemia in chronic kidney disease: Secondary | ICD-10-CM | POA: Diagnosis not present

## 2016-07-23 DIAGNOSIS — N186 End stage renal disease: Secondary | ICD-10-CM | POA: Diagnosis not present

## 2016-07-23 DIAGNOSIS — E211 Secondary hyperparathyroidism, not elsewhere classified: Secondary | ICD-10-CM | POA: Diagnosis not present

## 2016-07-23 DIAGNOSIS — N2581 Secondary hyperparathyroidism of renal origin: Secondary | ICD-10-CM | POA: Diagnosis not present

## 2016-07-24 DIAGNOSIS — I871 Compression of vein: Secondary | ICD-10-CM | POA: Diagnosis not present

## 2016-07-24 DIAGNOSIS — N186 End stage renal disease: Secondary | ICD-10-CM | POA: Diagnosis not present

## 2016-07-24 DIAGNOSIS — T82858A Stenosis of vascular prosthetic devices, implants and grafts, initial encounter: Secondary | ICD-10-CM | POA: Diagnosis not present

## 2016-07-24 DIAGNOSIS — Z992 Dependence on renal dialysis: Secondary | ICD-10-CM | POA: Diagnosis not present

## 2016-07-25 DIAGNOSIS — D631 Anemia in chronic kidney disease: Secondary | ICD-10-CM | POA: Diagnosis not present

## 2016-07-25 DIAGNOSIS — N2581 Secondary hyperparathyroidism of renal origin: Secondary | ICD-10-CM | POA: Diagnosis not present

## 2016-07-25 DIAGNOSIS — N186 End stage renal disease: Secondary | ICD-10-CM | POA: Diagnosis not present

## 2016-07-25 DIAGNOSIS — E211 Secondary hyperparathyroidism, not elsewhere classified: Secondary | ICD-10-CM | POA: Diagnosis not present

## 2016-07-26 DIAGNOSIS — E1142 Type 2 diabetes mellitus with diabetic polyneuropathy: Secondary | ICD-10-CM | POA: Diagnosis not present

## 2016-07-26 DIAGNOSIS — N185 Chronic kidney disease, stage 5: Secondary | ICD-10-CM | POA: Diagnosis not present

## 2016-07-26 DIAGNOSIS — J449 Chronic obstructive pulmonary disease, unspecified: Secondary | ICD-10-CM | POA: Diagnosis not present

## 2016-07-26 DIAGNOSIS — E1122 Type 2 diabetes mellitus with diabetic chronic kidney disease: Secondary | ICD-10-CM | POA: Diagnosis not present

## 2016-07-26 DIAGNOSIS — Z8673 Personal history of transient ischemic attack (TIA), and cerebral infarction without residual deficits: Secondary | ICD-10-CM | POA: Diagnosis not present

## 2016-07-26 DIAGNOSIS — I120 Hypertensive chronic kidney disease with stage 5 chronic kidney disease or end stage renal disease: Secondary | ICD-10-CM | POA: Diagnosis not present

## 2016-07-27 DIAGNOSIS — N186 End stage renal disease: Secondary | ICD-10-CM | POA: Diagnosis not present

## 2016-07-27 DIAGNOSIS — D631 Anemia in chronic kidney disease: Secondary | ICD-10-CM | POA: Diagnosis not present

## 2016-07-27 DIAGNOSIS — E211 Secondary hyperparathyroidism, not elsewhere classified: Secondary | ICD-10-CM | POA: Diagnosis not present

## 2016-07-27 DIAGNOSIS — N2581 Secondary hyperparathyroidism of renal origin: Secondary | ICD-10-CM | POA: Diagnosis not present

## 2016-07-30 DIAGNOSIS — N2581 Secondary hyperparathyroidism of renal origin: Secondary | ICD-10-CM | POA: Diagnosis not present

## 2016-07-30 DIAGNOSIS — E211 Secondary hyperparathyroidism, not elsewhere classified: Secondary | ICD-10-CM | POA: Diagnosis not present

## 2016-07-30 DIAGNOSIS — N186 End stage renal disease: Secondary | ICD-10-CM | POA: Diagnosis not present

## 2016-07-30 DIAGNOSIS — J449 Chronic obstructive pulmonary disease, unspecified: Secondary | ICD-10-CM | POA: Diagnosis not present

## 2016-07-30 DIAGNOSIS — Z6832 Body mass index (BMI) 32.0-32.9, adult: Secondary | ICD-10-CM | POA: Diagnosis not present

## 2016-07-30 DIAGNOSIS — E1122 Type 2 diabetes mellitus with diabetic chronic kidney disease: Secondary | ICD-10-CM | POA: Diagnosis not present

## 2016-07-30 DIAGNOSIS — E1142 Type 2 diabetes mellitus with diabetic polyneuropathy: Secondary | ICD-10-CM | POA: Diagnosis not present

## 2016-07-30 DIAGNOSIS — I10 Essential (primary) hypertension: Secondary | ICD-10-CM | POA: Diagnosis not present

## 2016-07-30 DIAGNOSIS — D631 Anemia in chronic kidney disease: Secondary | ICD-10-CM | POA: Diagnosis not present

## 2016-08-01 DIAGNOSIS — D631 Anemia in chronic kidney disease: Secondary | ICD-10-CM | POA: Diagnosis not present

## 2016-08-01 DIAGNOSIS — E211 Secondary hyperparathyroidism, not elsewhere classified: Secondary | ICD-10-CM | POA: Diagnosis not present

## 2016-08-01 DIAGNOSIS — R531 Weakness: Secondary | ICD-10-CM | POA: Diagnosis not present

## 2016-08-01 DIAGNOSIS — N186 End stage renal disease: Secondary | ICD-10-CM | POA: Diagnosis not present

## 2016-08-01 DIAGNOSIS — R404 Transient alteration of awareness: Secondary | ICD-10-CM | POA: Diagnosis not present

## 2016-08-01 DIAGNOSIS — N2581 Secondary hyperparathyroidism of renal origin: Secondary | ICD-10-CM | POA: Diagnosis not present

## 2016-08-02 DIAGNOSIS — E1122 Type 2 diabetes mellitus with diabetic chronic kidney disease: Secondary | ICD-10-CM | POA: Diagnosis not present

## 2016-08-02 DIAGNOSIS — I120 Hypertensive chronic kidney disease with stage 5 chronic kidney disease or end stage renal disease: Secondary | ICD-10-CM | POA: Diagnosis not present

## 2016-08-02 DIAGNOSIS — J449 Chronic obstructive pulmonary disease, unspecified: Secondary | ICD-10-CM | POA: Diagnosis not present

## 2016-08-02 DIAGNOSIS — E1142 Type 2 diabetes mellitus with diabetic polyneuropathy: Secondary | ICD-10-CM | POA: Diagnosis not present

## 2016-08-02 DIAGNOSIS — Z8673 Personal history of transient ischemic attack (TIA), and cerebral infarction without residual deficits: Secondary | ICD-10-CM | POA: Diagnosis not present

## 2016-08-02 DIAGNOSIS — N185 Chronic kidney disease, stage 5: Secondary | ICD-10-CM | POA: Diagnosis not present

## 2016-08-03 DIAGNOSIS — D631 Anemia in chronic kidney disease: Secondary | ICD-10-CM | POA: Diagnosis not present

## 2016-08-03 DIAGNOSIS — N186 End stage renal disease: Secondary | ICD-10-CM | POA: Diagnosis not present

## 2016-08-03 DIAGNOSIS — N2581 Secondary hyperparathyroidism of renal origin: Secondary | ICD-10-CM | POA: Diagnosis not present

## 2016-08-03 DIAGNOSIS — E211 Secondary hyperparathyroidism, not elsewhere classified: Secondary | ICD-10-CM | POA: Diagnosis not present

## 2016-08-05 DIAGNOSIS — N2581 Secondary hyperparathyroidism of renal origin: Secondary | ICD-10-CM | POA: Diagnosis not present

## 2016-08-05 DIAGNOSIS — E211 Secondary hyperparathyroidism, not elsewhere classified: Secondary | ICD-10-CM | POA: Diagnosis not present

## 2016-08-05 DIAGNOSIS — D631 Anemia in chronic kidney disease: Secondary | ICD-10-CM | POA: Diagnosis not present

## 2016-08-05 DIAGNOSIS — N186 End stage renal disease: Secondary | ICD-10-CM | POA: Diagnosis not present

## 2016-08-08 DIAGNOSIS — E211 Secondary hyperparathyroidism, not elsewhere classified: Secondary | ICD-10-CM | POA: Diagnosis not present

## 2016-08-08 DIAGNOSIS — N2581 Secondary hyperparathyroidism of renal origin: Secondary | ICD-10-CM | POA: Diagnosis not present

## 2016-08-08 DIAGNOSIS — D631 Anemia in chronic kidney disease: Secondary | ICD-10-CM | POA: Diagnosis not present

## 2016-08-08 DIAGNOSIS — N186 End stage renal disease: Secondary | ICD-10-CM | POA: Diagnosis not present

## 2016-08-10 DIAGNOSIS — N2581 Secondary hyperparathyroidism of renal origin: Secondary | ICD-10-CM | POA: Diagnosis not present

## 2016-08-10 DIAGNOSIS — E211 Secondary hyperparathyroidism, not elsewhere classified: Secondary | ICD-10-CM | POA: Diagnosis not present

## 2016-08-10 DIAGNOSIS — D631 Anemia in chronic kidney disease: Secondary | ICD-10-CM | POA: Diagnosis not present

## 2016-08-10 DIAGNOSIS — N186 End stage renal disease: Secondary | ICD-10-CM | POA: Diagnosis not present

## 2016-08-11 DIAGNOSIS — J449 Chronic obstructive pulmonary disease, unspecified: Secondary | ICD-10-CM | POA: Diagnosis not present

## 2016-08-11 DIAGNOSIS — E1142 Type 2 diabetes mellitus with diabetic polyneuropathy: Secondary | ICD-10-CM | POA: Diagnosis not present

## 2016-08-11 DIAGNOSIS — I120 Hypertensive chronic kidney disease with stage 5 chronic kidney disease or end stage renal disease: Secondary | ICD-10-CM | POA: Diagnosis not present

## 2016-08-11 DIAGNOSIS — E1122 Type 2 diabetes mellitus with diabetic chronic kidney disease: Secondary | ICD-10-CM | POA: Diagnosis not present

## 2016-08-11 DIAGNOSIS — Z8673 Personal history of transient ischemic attack (TIA), and cerebral infarction without residual deficits: Secondary | ICD-10-CM | POA: Diagnosis not present

## 2016-08-11 DIAGNOSIS — N185 Chronic kidney disease, stage 5: Secondary | ICD-10-CM | POA: Diagnosis not present

## 2016-08-12 DIAGNOSIS — E1129 Type 2 diabetes mellitus with other diabetic kidney complication: Secondary | ICD-10-CM | POA: Diagnosis not present

## 2016-08-12 DIAGNOSIS — E211 Secondary hyperparathyroidism, not elsewhere classified: Secondary | ICD-10-CM | POA: Diagnosis not present

## 2016-08-12 DIAGNOSIS — D631 Anemia in chronic kidney disease: Secondary | ICD-10-CM | POA: Diagnosis not present

## 2016-08-12 DIAGNOSIS — Z992 Dependence on renal dialysis: Secondary | ICD-10-CM | POA: Diagnosis not present

## 2016-08-12 DIAGNOSIS — N2581 Secondary hyperparathyroidism of renal origin: Secondary | ICD-10-CM | POA: Diagnosis not present

## 2016-08-12 DIAGNOSIS — N186 End stage renal disease: Secondary | ICD-10-CM | POA: Diagnosis not present

## 2016-08-14 DIAGNOSIS — J449 Chronic obstructive pulmonary disease, unspecified: Secondary | ICD-10-CM | POA: Diagnosis not present

## 2016-08-14 DIAGNOSIS — E1122 Type 2 diabetes mellitus with diabetic chronic kidney disease: Secondary | ICD-10-CM | POA: Diagnosis not present

## 2016-08-14 DIAGNOSIS — I120 Hypertensive chronic kidney disease with stage 5 chronic kidney disease or end stage renal disease: Secondary | ICD-10-CM | POA: Diagnosis not present

## 2016-08-14 DIAGNOSIS — Z8673 Personal history of transient ischemic attack (TIA), and cerebral infarction without residual deficits: Secondary | ICD-10-CM | POA: Diagnosis not present

## 2016-08-14 DIAGNOSIS — N185 Chronic kidney disease, stage 5: Secondary | ICD-10-CM | POA: Diagnosis not present

## 2016-08-14 DIAGNOSIS — E1142 Type 2 diabetes mellitus with diabetic polyneuropathy: Secondary | ICD-10-CM | POA: Diagnosis not present

## 2016-08-15 DIAGNOSIS — E785 Hyperlipidemia, unspecified: Secondary | ICD-10-CM | POA: Diagnosis not present

## 2016-08-15 DIAGNOSIS — G4700 Insomnia, unspecified: Secondary | ICD-10-CM | POA: Diagnosis not present

## 2016-08-15 DIAGNOSIS — S0990XA Unspecified injury of head, initial encounter: Secondary | ICD-10-CM | POA: Diagnosis not present

## 2016-08-15 DIAGNOSIS — N189 Chronic kidney disease, unspecified: Secondary | ICD-10-CM | POA: Diagnosis not present

## 2016-08-15 DIAGNOSIS — I2510 Atherosclerotic heart disease of native coronary artery without angina pectoris: Secondary | ICD-10-CM | POA: Diagnosis not present

## 2016-08-15 DIAGNOSIS — S298XXA Other specified injuries of thorax, initial encounter: Secondary | ICD-10-CM | POA: Diagnosis not present

## 2016-08-15 DIAGNOSIS — Z992 Dependence on renal dialysis: Secondary | ICD-10-CM | POA: Diagnosis not present

## 2016-08-15 DIAGNOSIS — S3993XA Unspecified injury of pelvis, initial encounter: Secondary | ICD-10-CM | POA: Diagnosis not present

## 2016-08-15 DIAGNOSIS — S301XXA Contusion of abdominal wall, initial encounter: Secondary | ICD-10-CM | POA: Diagnosis not present

## 2016-08-15 DIAGNOSIS — E875 Hyperkalemia: Secondary | ICD-10-CM | POA: Diagnosis not present

## 2016-08-15 DIAGNOSIS — K828 Other specified diseases of gallbladder: Secondary | ICD-10-CM | POA: Diagnosis not present

## 2016-08-15 DIAGNOSIS — F418 Other specified anxiety disorders: Secondary | ICD-10-CM | POA: Diagnosis not present

## 2016-08-15 DIAGNOSIS — N186 End stage renal disease: Secondary | ICD-10-CM | POA: Diagnosis not present

## 2016-08-15 DIAGNOSIS — S299XXA Unspecified injury of thorax, initial encounter: Secondary | ICD-10-CM | POA: Diagnosis not present

## 2016-08-15 DIAGNOSIS — M25552 Pain in left hip: Secondary | ICD-10-CM | POA: Diagnosis not present

## 2016-08-15 DIAGNOSIS — E1122 Type 2 diabetes mellitus with diabetic chronic kidney disease: Secondary | ICD-10-CM | POA: Diagnosis not present

## 2016-08-15 DIAGNOSIS — E1129 Type 2 diabetes mellitus with other diabetic kidney complication: Secondary | ICD-10-CM | POA: Diagnosis not present

## 2016-08-15 DIAGNOSIS — Z79899 Other long term (current) drug therapy: Secondary | ICD-10-CM | POA: Diagnosis not present

## 2016-08-15 DIAGNOSIS — R52 Pain, unspecified: Secondary | ICD-10-CM | POA: Diagnosis not present

## 2016-08-15 DIAGNOSIS — S199XXA Unspecified injury of neck, initial encounter: Secondary | ICD-10-CM | POA: Diagnosis not present

## 2016-08-15 DIAGNOSIS — E11649 Type 2 diabetes mellitus with hypoglycemia without coma: Secondary | ICD-10-CM | POA: Diagnosis not present

## 2016-08-15 DIAGNOSIS — W0110XA Fall on same level from slipping, tripping and stumbling with subsequent striking against unspecified object, initial encounter: Secondary | ICD-10-CM | POA: Diagnosis not present

## 2016-08-15 DIAGNOSIS — N185 Chronic kidney disease, stage 5: Secondary | ICD-10-CM | POA: Diagnosis not present

## 2016-08-15 DIAGNOSIS — S3991XA Unspecified injury of abdomen, initial encounter: Secondary | ICD-10-CM | POA: Diagnosis not present

## 2016-08-15 DIAGNOSIS — E039 Hypothyroidism, unspecified: Secondary | ICD-10-CM | POA: Diagnosis not present

## 2016-08-15 DIAGNOSIS — D631 Anemia in chronic kidney disease: Secondary | ICD-10-CM | POA: Diagnosis not present

## 2016-08-15 DIAGNOSIS — Z87891 Personal history of nicotine dependence: Secondary | ICD-10-CM | POA: Diagnosis not present

## 2016-08-15 DIAGNOSIS — I700 Atherosclerosis of aorta: Secondary | ICD-10-CM | POA: Diagnosis not present

## 2016-08-15 DIAGNOSIS — R296 Repeated falls: Secondary | ICD-10-CM | POA: Diagnosis not present

## 2016-08-15 DIAGNOSIS — M79605 Pain in left leg: Secondary | ICD-10-CM | POA: Diagnosis not present

## 2016-08-15 DIAGNOSIS — S7002XA Contusion of left hip, initial encounter: Secondary | ICD-10-CM | POA: Diagnosis not present

## 2016-08-15 DIAGNOSIS — Z885 Allergy status to narcotic agent status: Secondary | ICD-10-CM | POA: Diagnosis not present

## 2016-08-16 DIAGNOSIS — E11649 Type 2 diabetes mellitus with hypoglycemia without coma: Secondary | ICD-10-CM | POA: Diagnosis not present

## 2016-08-16 DIAGNOSIS — S79912A Unspecified injury of left hip, initial encounter: Secondary | ICD-10-CM | POA: Diagnosis not present

## 2016-08-16 DIAGNOSIS — N186 End stage renal disease: Secondary | ICD-10-CM | POA: Diagnosis not present

## 2016-08-16 DIAGNOSIS — D631 Anemia in chronic kidney disease: Secondary | ICD-10-CM | POA: Diagnosis not present

## 2016-08-16 DIAGNOSIS — R296 Repeated falls: Secondary | ICD-10-CM | POA: Diagnosis not present

## 2016-08-16 DIAGNOSIS — M25552 Pain in left hip: Secondary | ICD-10-CM | POA: Diagnosis not present

## 2016-08-16 DIAGNOSIS — Z992 Dependence on renal dialysis: Secondary | ICD-10-CM | POA: Diagnosis not present

## 2016-08-16 DIAGNOSIS — E1129 Type 2 diabetes mellitus with other diabetic kidney complication: Secondary | ICD-10-CM | POA: Diagnosis not present

## 2016-08-17 DIAGNOSIS — E11649 Type 2 diabetes mellitus with hypoglycemia without coma: Secondary | ICD-10-CM | POA: Diagnosis not present

## 2016-08-17 DIAGNOSIS — R296 Repeated falls: Secondary | ICD-10-CM | POA: Diagnosis not present

## 2016-08-17 DIAGNOSIS — N186 End stage renal disease: Secondary | ICD-10-CM | POA: Diagnosis not present

## 2016-08-17 DIAGNOSIS — Z992 Dependence on renal dialysis: Secondary | ICD-10-CM | POA: Diagnosis not present

## 2016-08-17 DIAGNOSIS — D631 Anemia in chronic kidney disease: Secondary | ICD-10-CM | POA: Diagnosis not present

## 2016-08-17 DIAGNOSIS — I10 Essential (primary) hypertension: Secondary | ICD-10-CM | POA: Diagnosis not present

## 2016-08-18 DIAGNOSIS — E11649 Type 2 diabetes mellitus with hypoglycemia without coma: Secondary | ICD-10-CM | POA: Diagnosis not present

## 2016-08-18 DIAGNOSIS — R296 Repeated falls: Secondary | ICD-10-CM | POA: Diagnosis not present

## 2016-08-19 DIAGNOSIS — E11649 Type 2 diabetes mellitus with hypoglycemia without coma: Secondary | ICD-10-CM | POA: Diagnosis not present

## 2016-08-19 DIAGNOSIS — R296 Repeated falls: Secondary | ICD-10-CM | POA: Diagnosis not present

## 2016-08-20 DIAGNOSIS — E11649 Type 2 diabetes mellitus with hypoglycemia without coma: Secondary | ICD-10-CM | POA: Diagnosis not present

## 2016-08-20 DIAGNOSIS — R296 Repeated falls: Secondary | ICD-10-CM | POA: Diagnosis not present

## 2016-08-21 DIAGNOSIS — D631 Anemia in chronic kidney disease: Secondary | ICD-10-CM | POA: Diagnosis not present

## 2016-08-21 DIAGNOSIS — R296 Repeated falls: Secondary | ICD-10-CM | POA: Diagnosis not present

## 2016-08-21 DIAGNOSIS — Z992 Dependence on renal dialysis: Secondary | ICD-10-CM | POA: Diagnosis not present

## 2016-08-21 DIAGNOSIS — E1129 Type 2 diabetes mellitus with other diabetic kidney complication: Secondary | ICD-10-CM | POA: Diagnosis not present

## 2016-08-21 DIAGNOSIS — N186 End stage renal disease: Secondary | ICD-10-CM | POA: Diagnosis not present

## 2016-08-21 DIAGNOSIS — E11649 Type 2 diabetes mellitus with hypoglycemia without coma: Secondary | ICD-10-CM | POA: Diagnosis not present

## 2016-08-22 DIAGNOSIS — R4182 Altered mental status, unspecified: Secondary | ICD-10-CM | POA: Diagnosis not present

## 2016-08-22 DIAGNOSIS — E039 Hypothyroidism, unspecified: Secondary | ICD-10-CM | POA: Diagnosis not present

## 2016-08-22 DIAGNOSIS — R404 Transient alteration of awareness: Secondary | ICD-10-CM | POA: Diagnosis not present

## 2016-08-22 DIAGNOSIS — I120 Hypertensive chronic kidney disease with stage 5 chronic kidney disease or end stage renal disease: Secondary | ICD-10-CM | POA: Diagnosis not present

## 2016-08-22 DIAGNOSIS — N185 Chronic kidney disease, stage 5: Secondary | ICD-10-CM | POA: Diagnosis not present

## 2016-08-22 DIAGNOSIS — N186 End stage renal disease: Secondary | ICD-10-CM | POA: Diagnosis not present

## 2016-08-22 DIAGNOSIS — R1312 Dysphagia, oropharyngeal phase: Secondary | ICD-10-CM | POA: Diagnosis not present

## 2016-08-22 DIAGNOSIS — M6281 Muscle weakness (generalized): Secondary | ICD-10-CM | POA: Diagnosis not present

## 2016-08-22 DIAGNOSIS — R51 Headache: Secondary | ICD-10-CM | POA: Diagnosis not present

## 2016-08-22 DIAGNOSIS — R401 Stupor: Secondary | ICD-10-CM | POA: Diagnosis not present

## 2016-08-22 DIAGNOSIS — N2581 Secondary hyperparathyroidism of renal origin: Secondary | ICD-10-CM | POA: Diagnosis not present

## 2016-08-22 DIAGNOSIS — Z794 Long term (current) use of insulin: Secondary | ICD-10-CM | POA: Diagnosis not present

## 2016-08-22 DIAGNOSIS — E1122 Type 2 diabetes mellitus with diabetic chronic kidney disease: Secondary | ICD-10-CM | POA: Diagnosis not present

## 2016-08-22 DIAGNOSIS — R2689 Other abnormalities of gait and mobility: Secondary | ICD-10-CM | POA: Diagnosis not present

## 2016-08-22 DIAGNOSIS — Z7401 Bed confinement status: Secondary | ICD-10-CM | POA: Diagnosis not present

## 2016-08-22 DIAGNOSIS — R278 Other lack of coordination: Secondary | ICD-10-CM | POA: Diagnosis not present

## 2016-08-22 DIAGNOSIS — D631 Anemia in chronic kidney disease: Secondary | ICD-10-CM | POA: Diagnosis not present

## 2016-08-22 DIAGNOSIS — Z992 Dependence on renal dialysis: Secondary | ICD-10-CM | POA: Diagnosis not present

## 2016-08-22 DIAGNOSIS — R279 Unspecified lack of coordination: Secondary | ICD-10-CM | POA: Diagnosis not present

## 2016-08-22 DIAGNOSIS — Z87891 Personal history of nicotine dependence: Secondary | ICD-10-CM | POA: Diagnosis not present

## 2016-08-22 DIAGNOSIS — R531 Weakness: Secondary | ICD-10-CM | POA: Diagnosis not present

## 2016-08-22 DIAGNOSIS — E7800 Pure hypercholesterolemia, unspecified: Secondary | ICD-10-CM | POA: Diagnosis not present

## 2016-08-22 DIAGNOSIS — R4189 Other symptoms and signs involving cognitive functions and awareness: Secondary | ICD-10-CM | POA: Diagnosis not present

## 2016-08-22 DIAGNOSIS — R2681 Unsteadiness on feet: Secondary | ICD-10-CM | POA: Diagnosis not present

## 2016-08-22 DIAGNOSIS — Z79899 Other long term (current) drug therapy: Secondary | ICD-10-CM | POA: Diagnosis not present

## 2016-08-22 DIAGNOSIS — E1140 Type 2 diabetes mellitus with diabetic neuropathy, unspecified: Secondary | ICD-10-CM | POA: Diagnosis not present

## 2016-08-23 DIAGNOSIS — R4189 Other symptoms and signs involving cognitive functions and awareness: Secondary | ICD-10-CM | POA: Diagnosis not present

## 2016-08-23 DIAGNOSIS — R2681 Unsteadiness on feet: Secondary | ICD-10-CM | POA: Diagnosis not present

## 2016-08-23 DIAGNOSIS — N185 Chronic kidney disease, stage 5: Secondary | ICD-10-CM | POA: Diagnosis not present

## 2016-08-23 DIAGNOSIS — R2689 Other abnormalities of gait and mobility: Secondary | ICD-10-CM | POA: Diagnosis not present

## 2016-08-23 DIAGNOSIS — M6281 Muscle weakness (generalized): Secondary | ICD-10-CM | POA: Diagnosis not present

## 2016-08-23 DIAGNOSIS — R278 Other lack of coordination: Secondary | ICD-10-CM | POA: Diagnosis not present

## 2016-08-24 DIAGNOSIS — R278 Other lack of coordination: Secondary | ICD-10-CM | POA: Diagnosis not present

## 2016-08-24 DIAGNOSIS — N186 End stage renal disease: Secondary | ICD-10-CM | POA: Diagnosis not present

## 2016-08-24 DIAGNOSIS — N185 Chronic kidney disease, stage 5: Secondary | ICD-10-CM | POA: Diagnosis not present

## 2016-08-24 DIAGNOSIS — R4189 Other symptoms and signs involving cognitive functions and awareness: Secondary | ICD-10-CM | POA: Diagnosis not present

## 2016-08-24 DIAGNOSIS — E039 Hypothyroidism, unspecified: Secondary | ICD-10-CM | POA: Diagnosis not present

## 2016-08-24 DIAGNOSIS — M6281 Muscle weakness (generalized): Secondary | ICD-10-CM | POA: Diagnosis not present

## 2016-08-24 DIAGNOSIS — R2689 Other abnormalities of gait and mobility: Secondary | ICD-10-CM | POA: Diagnosis not present

## 2016-08-24 DIAGNOSIS — I10 Essential (primary) hypertension: Secondary | ICD-10-CM | POA: Diagnosis not present

## 2016-08-24 DIAGNOSIS — N2581 Secondary hyperparathyroidism of renal origin: Secondary | ICD-10-CM | POA: Diagnosis not present

## 2016-08-24 DIAGNOSIS — R740 Nonspecific elevation of levels of transaminase and lactic acid dehydrogenase [LDH]: Secondary | ICD-10-CM | POA: Diagnosis not present

## 2016-08-24 DIAGNOSIS — R2681 Unsteadiness on feet: Secondary | ICD-10-CM | POA: Diagnosis not present

## 2016-08-24 DIAGNOSIS — E119 Type 2 diabetes mellitus without complications: Secondary | ICD-10-CM | POA: Diagnosis not present

## 2016-08-24 DIAGNOSIS — D631 Anemia in chronic kidney disease: Secondary | ICD-10-CM | POA: Diagnosis not present

## 2016-08-24 DIAGNOSIS — E785 Hyperlipidemia, unspecified: Secondary | ICD-10-CM | POA: Diagnosis not present

## 2016-08-25 DIAGNOSIS — E11649 Type 2 diabetes mellitus with hypoglycemia without coma: Secondary | ICD-10-CM | POA: Diagnosis not present

## 2016-08-25 DIAGNOSIS — R296 Repeated falls: Secondary | ICD-10-CM | POA: Diagnosis not present

## 2016-08-25 DIAGNOSIS — N185 Chronic kidney disease, stage 5: Secondary | ICD-10-CM | POA: Diagnosis not present

## 2016-08-27 DIAGNOSIS — R4189 Other symptoms and signs involving cognitive functions and awareness: Secondary | ICD-10-CM | POA: Diagnosis not present

## 2016-08-27 DIAGNOSIS — R2681 Unsteadiness on feet: Secondary | ICD-10-CM | POA: Diagnosis not present

## 2016-08-27 DIAGNOSIS — N186 End stage renal disease: Secondary | ICD-10-CM | POA: Diagnosis not present

## 2016-08-27 DIAGNOSIS — N2581 Secondary hyperparathyroidism of renal origin: Secondary | ICD-10-CM | POA: Diagnosis not present

## 2016-08-27 DIAGNOSIS — R278 Other lack of coordination: Secondary | ICD-10-CM | POA: Diagnosis not present

## 2016-08-27 DIAGNOSIS — N185 Chronic kidney disease, stage 5: Secondary | ICD-10-CM | POA: Diagnosis not present

## 2016-08-27 DIAGNOSIS — D631 Anemia in chronic kidney disease: Secondary | ICD-10-CM | POA: Diagnosis not present

## 2016-08-27 DIAGNOSIS — R2689 Other abnormalities of gait and mobility: Secondary | ICD-10-CM | POA: Diagnosis not present

## 2016-08-27 DIAGNOSIS — M6281 Muscle weakness (generalized): Secondary | ICD-10-CM | POA: Diagnosis not present

## 2016-08-28 DIAGNOSIS — N185 Chronic kidney disease, stage 5: Secondary | ICD-10-CM | POA: Diagnosis not present

## 2016-08-28 DIAGNOSIS — R2689 Other abnormalities of gait and mobility: Secondary | ICD-10-CM | POA: Diagnosis not present

## 2016-08-28 DIAGNOSIS — R278 Other lack of coordination: Secondary | ICD-10-CM | POA: Diagnosis not present

## 2016-08-28 DIAGNOSIS — M6281 Muscle weakness (generalized): Secondary | ICD-10-CM | POA: Diagnosis not present

## 2016-08-28 DIAGNOSIS — R2681 Unsteadiness on feet: Secondary | ICD-10-CM | POA: Diagnosis not present

## 2016-08-28 DIAGNOSIS — R4189 Other symptoms and signs involving cognitive functions and awareness: Secondary | ICD-10-CM | POA: Diagnosis not present

## 2016-08-29 DIAGNOSIS — R296 Repeated falls: Secondary | ICD-10-CM | POA: Diagnosis not present

## 2016-08-29 DIAGNOSIS — N185 Chronic kidney disease, stage 5: Secondary | ICD-10-CM | POA: Diagnosis not present

## 2016-08-29 DIAGNOSIS — E11649 Type 2 diabetes mellitus with hypoglycemia without coma: Secondary | ICD-10-CM | POA: Diagnosis not present

## 2016-08-29 DIAGNOSIS — I10 Essential (primary) hypertension: Secondary | ICD-10-CM | POA: Diagnosis not present

## 2016-08-30 DIAGNOSIS — J9601 Acute respiratory failure with hypoxia: Secondary | ICD-10-CM | POA: Diagnosis not present

## 2016-08-30 DIAGNOSIS — I120 Hypertensive chronic kidney disease with stage 5 chronic kidney disease or end stage renal disease: Secondary | ICD-10-CM | POA: Diagnosis not present

## 2016-08-30 DIAGNOSIS — R062 Wheezing: Secondary | ICD-10-CM | POA: Diagnosis not present

## 2016-08-30 DIAGNOSIS — F339 Major depressive disorder, recurrent, unspecified: Secondary | ICD-10-CM | POA: Diagnosis not present

## 2016-08-30 DIAGNOSIS — N185 Chronic kidney disease, stage 5: Secondary | ICD-10-CM | POA: Diagnosis not present

## 2016-08-30 DIAGNOSIS — R2681 Unsteadiness on feet: Secondary | ICD-10-CM | POA: Diagnosis not present

## 2016-08-30 DIAGNOSIS — R0600 Dyspnea, unspecified: Secondary | ICD-10-CM | POA: Diagnosis not present

## 2016-08-30 DIAGNOSIS — R0682 Tachypnea, not elsewhere classified: Secondary | ICD-10-CM | POA: Diagnosis not present

## 2016-08-30 DIAGNOSIS — R278 Other lack of coordination: Secondary | ICD-10-CM | POA: Diagnosis not present

## 2016-08-30 DIAGNOSIS — M6281 Muscle weakness (generalized): Secondary | ICD-10-CM | POA: Diagnosis not present

## 2016-08-30 DIAGNOSIS — N186 End stage renal disease: Secondary | ICD-10-CM | POA: Diagnosis not present

## 2016-08-30 DIAGNOSIS — E875 Hyperkalemia: Secondary | ICD-10-CM | POA: Diagnosis not present

## 2016-08-30 DIAGNOSIS — R531 Weakness: Secondary | ICD-10-CM | POA: Diagnosis not present

## 2016-08-30 DIAGNOSIS — I10 Essential (primary) hypertension: Secondary | ICD-10-CM | POA: Diagnosis not present

## 2016-08-30 DIAGNOSIS — J680 Bronchitis and pneumonitis due to chemicals, gases, fumes and vapors: Secondary | ICD-10-CM | POA: Diagnosis not present

## 2016-08-30 DIAGNOSIS — E871 Hypo-osmolality and hyponatremia: Secondary | ICD-10-CM | POA: Diagnosis not present

## 2016-08-30 DIAGNOSIS — R2689 Other abnormalities of gait and mobility: Secondary | ICD-10-CM | POA: Diagnosis not present

## 2016-08-30 DIAGNOSIS — T59811A Toxic effect of smoke, accidental (unintentional), initial encounter: Secondary | ICD-10-CM | POA: Diagnosis not present

## 2016-08-30 DIAGNOSIS — R4189 Other symptoms and signs involving cognitive functions and awareness: Secondary | ICD-10-CM | POA: Diagnosis not present

## 2016-08-31 DIAGNOSIS — F5104 Psychophysiologic insomnia: Secondary | ICD-10-CM | POA: Diagnosis not present

## 2016-08-31 DIAGNOSIS — J705 Respiratory conditions due to smoke inhalation: Secondary | ICD-10-CM | POA: Diagnosis present

## 2016-08-31 DIAGNOSIS — E785 Hyperlipidemia, unspecified: Secondary | ICD-10-CM | POA: Diagnosis not present

## 2016-08-31 DIAGNOSIS — I10 Essential (primary) hypertension: Secondary | ICD-10-CM | POA: Diagnosis not present

## 2016-08-31 DIAGNOSIS — Z79899 Other long term (current) drug therapy: Secondary | ICD-10-CM | POA: Diagnosis not present

## 2016-08-31 DIAGNOSIS — N185 Chronic kidney disease, stage 5: Secondary | ICD-10-CM | POA: Diagnosis present

## 2016-08-31 DIAGNOSIS — E1122 Type 2 diabetes mellitus with diabetic chronic kidney disease: Secondary | ICD-10-CM | POA: Diagnosis present

## 2016-08-31 DIAGNOSIS — K219 Gastro-esophageal reflux disease without esophagitis: Secondary | ICD-10-CM | POA: Diagnosis present

## 2016-08-31 DIAGNOSIS — M6281 Muscle weakness (generalized): Secondary | ICD-10-CM | POA: Diagnosis not present

## 2016-08-31 DIAGNOSIS — I2510 Atherosclerotic heart disease of native coronary artery without angina pectoris: Secondary | ICD-10-CM | POA: Diagnosis not present

## 2016-08-31 DIAGNOSIS — Z992 Dependence on renal dialysis: Secondary | ICD-10-CM | POA: Diagnosis not present

## 2016-08-31 DIAGNOSIS — E875 Hyperkalemia: Secondary | ICD-10-CM | POA: Diagnosis not present

## 2016-08-31 DIAGNOSIS — N390 Urinary tract infection, site not specified: Secondary | ICD-10-CM | POA: Diagnosis not present

## 2016-08-31 DIAGNOSIS — F339 Major depressive disorder, recurrent, unspecified: Secondary | ICD-10-CM | POA: Diagnosis present

## 2016-08-31 DIAGNOSIS — D631 Anemia in chronic kidney disease: Secondary | ICD-10-CM | POA: Diagnosis present

## 2016-08-31 DIAGNOSIS — R279 Unspecified lack of coordination: Secondary | ICD-10-CM | POA: Diagnosis not present

## 2016-08-31 DIAGNOSIS — Z7902 Long term (current) use of antithrombotics/antiplatelets: Secondary | ICD-10-CM | POA: Diagnosis not present

## 2016-08-31 DIAGNOSIS — Z885 Allergy status to narcotic agent status: Secondary | ICD-10-CM | POA: Diagnosis not present

## 2016-08-31 DIAGNOSIS — N186 End stage renal disease: Secondary | ICD-10-CM | POA: Diagnosis not present

## 2016-08-31 DIAGNOSIS — J680 Bronchitis and pneumonitis due to chemicals, gases, fumes and vapors: Secondary | ICD-10-CM | POA: Diagnosis present

## 2016-08-31 DIAGNOSIS — J689 Unspecified respiratory condition due to chemicals, gases, fumes and vapors: Secondary | ICD-10-CM | POA: Diagnosis not present

## 2016-08-31 DIAGNOSIS — J9601 Acute respiratory failure with hypoxia: Secondary | ICD-10-CM | POA: Diagnosis not present

## 2016-08-31 DIAGNOSIS — T59811A Toxic effect of smoke, accidental (unintentional), initial encounter: Secondary | ICD-10-CM | POA: Diagnosis present

## 2016-08-31 DIAGNOSIS — I63512 Cerebral infarction due to unspecified occlusion or stenosis of left middle cerebral artery: Secondary | ICD-10-CM | POA: Diagnosis not present

## 2016-08-31 DIAGNOSIS — I509 Heart failure, unspecified: Secondary | ICD-10-CM | POA: Diagnosis not present

## 2016-08-31 DIAGNOSIS — I120 Hypertensive chronic kidney disease with stage 5 chronic kidney disease or end stage renal disease: Secondary | ICD-10-CM | POA: Diagnosis present

## 2016-08-31 DIAGNOSIS — Z87891 Personal history of nicotine dependence: Secondary | ICD-10-CM | POA: Diagnosis not present

## 2016-08-31 DIAGNOSIS — J430 Unilateral pulmonary emphysema [MacLeod's syndrome]: Secondary | ICD-10-CM | POA: Diagnosis present

## 2016-08-31 DIAGNOSIS — R278 Other lack of coordination: Secondary | ICD-10-CM | POA: Diagnosis not present

## 2016-08-31 DIAGNOSIS — Z888 Allergy status to other drugs, medicaments and biological substances status: Secondary | ICD-10-CM | POA: Diagnosis not present

## 2016-08-31 DIAGNOSIS — R296 Repeated falls: Secondary | ICD-10-CM | POA: Diagnosis not present

## 2016-08-31 DIAGNOSIS — Z7401 Bed confinement status: Secondary | ICD-10-CM | POA: Diagnosis not present

## 2016-08-31 DIAGNOSIS — R2681 Unsteadiness on feet: Secondary | ICD-10-CM | POA: Diagnosis not present

## 2016-08-31 DIAGNOSIS — R4182 Altered mental status, unspecified: Secondary | ICD-10-CM | POA: Diagnosis not present

## 2016-08-31 DIAGNOSIS — F411 Generalized anxiety disorder: Secondary | ICD-10-CM | POA: Diagnosis not present

## 2016-08-31 DIAGNOSIS — E1129 Type 2 diabetes mellitus with other diabetic kidney complication: Secondary | ICD-10-CM | POA: Diagnosis not present

## 2016-08-31 DIAGNOSIS — E1142 Type 2 diabetes mellitus with diabetic polyneuropathy: Secondary | ICD-10-CM | POA: Diagnosis present

## 2016-08-31 DIAGNOSIS — E1140 Type 2 diabetes mellitus with diabetic neuropathy, unspecified: Secondary | ICD-10-CM | POA: Diagnosis not present

## 2016-08-31 DIAGNOSIS — N170 Acute kidney failure with tubular necrosis: Secondary | ICD-10-CM | POA: Diagnosis not present

## 2016-08-31 DIAGNOSIS — E039 Hypothyroidism, unspecified: Secondary | ICD-10-CM | POA: Diagnosis present

## 2016-08-31 DIAGNOSIS — R2689 Other abnormalities of gait and mobility: Secondary | ICD-10-CM | POA: Diagnosis not present

## 2016-09-04 DIAGNOSIS — F411 Generalized anxiety disorder: Secondary | ICD-10-CM | POA: Diagnosis not present

## 2016-09-04 DIAGNOSIS — K219 Gastro-esophageal reflux disease without esophagitis: Secondary | ICD-10-CM | POA: Diagnosis not present

## 2016-09-04 DIAGNOSIS — E8770 Fluid overload, unspecified: Secondary | ICD-10-CM | POA: Diagnosis not present

## 2016-09-04 DIAGNOSIS — I10 Essential (primary) hypertension: Secondary | ICD-10-CM | POA: Diagnosis not present

## 2016-09-04 DIAGNOSIS — Z7401 Bed confinement status: Secondary | ICD-10-CM | POA: Diagnosis not present

## 2016-09-04 DIAGNOSIS — I63512 Cerebral infarction due to unspecified occlusion or stenosis of left middle cerebral artery: Secondary | ICD-10-CM | POA: Diagnosis not present

## 2016-09-04 DIAGNOSIS — E1129 Type 2 diabetes mellitus with other diabetic kidney complication: Secondary | ICD-10-CM | POA: Diagnosis not present

## 2016-09-04 DIAGNOSIS — R4182 Altered mental status, unspecified: Secondary | ICD-10-CM | POA: Diagnosis not present

## 2016-09-04 DIAGNOSIS — E785 Hyperlipidemia, unspecified: Secondary | ICD-10-CM | POA: Diagnosis not present

## 2016-09-04 DIAGNOSIS — E1140 Type 2 diabetes mellitus with diabetic neuropathy, unspecified: Secondary | ICD-10-CM | POA: Diagnosis not present

## 2016-09-04 DIAGNOSIS — G4700 Insomnia, unspecified: Secondary | ICD-10-CM | POA: Diagnosis not present

## 2016-09-04 DIAGNOSIS — E039 Hypothyroidism, unspecified: Secondary | ICD-10-CM | POA: Diagnosis not present

## 2016-09-04 DIAGNOSIS — N170 Acute kidney failure with tubular necrosis: Secondary | ICD-10-CM | POA: Diagnosis not present

## 2016-09-04 DIAGNOSIS — R410 Disorientation, unspecified: Secondary | ICD-10-CM | POA: Diagnosis not present

## 2016-09-04 DIAGNOSIS — J9601 Acute respiratory failure with hypoxia: Secondary | ICD-10-CM | POA: Diagnosis not present

## 2016-09-04 DIAGNOSIS — F339 Major depressive disorder, recurrent, unspecified: Secondary | ICD-10-CM | POA: Diagnosis not present

## 2016-09-04 DIAGNOSIS — N186 End stage renal disease: Secondary | ICD-10-CM | POA: Diagnosis not present

## 2016-09-04 DIAGNOSIS — D509 Iron deficiency anemia, unspecified: Secondary | ICD-10-CM | POA: Diagnosis not present

## 2016-09-04 DIAGNOSIS — M6281 Muscle weakness (generalized): Secondary | ICD-10-CM | POA: Diagnosis not present

## 2016-09-04 DIAGNOSIS — N185 Chronic kidney disease, stage 5: Secondary | ICD-10-CM | POA: Diagnosis not present

## 2016-09-04 DIAGNOSIS — R2681 Unsteadiness on feet: Secondary | ICD-10-CM | POA: Diagnosis not present

## 2016-09-04 DIAGNOSIS — N2581 Secondary hyperparathyroidism of renal origin: Secondary | ICD-10-CM | POA: Diagnosis not present

## 2016-09-04 DIAGNOSIS — R279 Unspecified lack of coordination: Secondary | ICD-10-CM | POA: Diagnosis not present

## 2016-09-04 DIAGNOSIS — F5104 Psychophysiologic insomnia: Secondary | ICD-10-CM | POA: Diagnosis not present

## 2016-09-04 DIAGNOSIS — R278 Other lack of coordination: Secondary | ICD-10-CM | POA: Diagnosis not present

## 2016-09-04 DIAGNOSIS — Z1159 Encounter for screening for other viral diseases: Secondary | ICD-10-CM | POA: Diagnosis not present

## 2016-09-04 DIAGNOSIS — N390 Urinary tract infection, site not specified: Secondary | ICD-10-CM | POA: Diagnosis not present

## 2016-09-04 DIAGNOSIS — E1151 Type 2 diabetes mellitus with diabetic peripheral angiopathy without gangrene: Secondary | ICD-10-CM | POA: Diagnosis not present

## 2016-09-04 DIAGNOSIS — F331 Major depressive disorder, recurrent, moderate: Secondary | ICD-10-CM | POA: Diagnosis not present

## 2016-09-04 DIAGNOSIS — E875 Hyperkalemia: Secondary | ICD-10-CM | POA: Diagnosis not present

## 2016-09-04 DIAGNOSIS — I2510 Atherosclerotic heart disease of native coronary artery without angina pectoris: Secondary | ICD-10-CM | POA: Diagnosis not present

## 2016-09-04 DIAGNOSIS — R2689 Other abnormalities of gait and mobility: Secondary | ICD-10-CM | POA: Diagnosis not present

## 2016-09-04 DIAGNOSIS — R296 Repeated falls: Secondary | ICD-10-CM | POA: Diagnosis not present

## 2016-09-04 DIAGNOSIS — D631 Anemia in chronic kidney disease: Secondary | ICD-10-CM | POA: Diagnosis not present

## 2016-09-04 DIAGNOSIS — Z992 Dependence on renal dialysis: Secondary | ICD-10-CM | POA: Diagnosis not present

## 2016-09-04 DIAGNOSIS — F39 Unspecified mood [affective] disorder: Secondary | ICD-10-CM | POA: Diagnosis not present

## 2016-09-04 DIAGNOSIS — F329 Major depressive disorder, single episode, unspecified: Secondary | ICD-10-CM | POA: Diagnosis not present

## 2016-09-05 DIAGNOSIS — N186 End stage renal disease: Secondary | ICD-10-CM | POA: Diagnosis not present

## 2016-09-05 DIAGNOSIS — N2581 Secondary hyperparathyroidism of renal origin: Secondary | ICD-10-CM | POA: Diagnosis not present

## 2016-09-05 DIAGNOSIS — D631 Anemia in chronic kidney disease: Secondary | ICD-10-CM | POA: Diagnosis not present

## 2016-09-07 DIAGNOSIS — N186 End stage renal disease: Secondary | ICD-10-CM | POA: Diagnosis not present

## 2016-09-07 DIAGNOSIS — D631 Anemia in chronic kidney disease: Secondary | ICD-10-CM | POA: Diagnosis not present

## 2016-09-07 DIAGNOSIS — N2581 Secondary hyperparathyroidism of renal origin: Secondary | ICD-10-CM | POA: Diagnosis not present

## 2016-09-10 DIAGNOSIS — N186 End stage renal disease: Secondary | ICD-10-CM | POA: Diagnosis not present

## 2016-09-10 DIAGNOSIS — D631 Anemia in chronic kidney disease: Secondary | ICD-10-CM | POA: Diagnosis not present

## 2016-09-10 DIAGNOSIS — N2581 Secondary hyperparathyroidism of renal origin: Secondary | ICD-10-CM | POA: Diagnosis not present

## 2016-09-11 DIAGNOSIS — N2581 Secondary hyperparathyroidism of renal origin: Secondary | ICD-10-CM | POA: Diagnosis not present

## 2016-09-11 DIAGNOSIS — D509 Iron deficiency anemia, unspecified: Secondary | ICD-10-CM | POA: Diagnosis not present

## 2016-09-11 DIAGNOSIS — Z992 Dependence on renal dialysis: Secondary | ICD-10-CM | POA: Diagnosis not present

## 2016-09-11 DIAGNOSIS — N186 End stage renal disease: Secondary | ICD-10-CM | POA: Diagnosis not present

## 2016-09-12 DIAGNOSIS — J440 Chronic obstructive pulmonary disease with acute lower respiratory infection: Secondary | ICD-10-CM | POA: Insufficient documentation

## 2016-09-12 DIAGNOSIS — Z992 Dependence on renal dialysis: Secondary | ICD-10-CM | POA: Diagnosis not present

## 2016-09-12 DIAGNOSIS — Z9189 Other specified personal risk factors, not elsewhere classified: Secondary | ICD-10-CM | POA: Insufficient documentation

## 2016-09-12 DIAGNOSIS — E1129 Type 2 diabetes mellitus with other diabetic kidney complication: Secondary | ICD-10-CM | POA: Diagnosis not present

## 2016-09-12 DIAGNOSIS — N186 End stage renal disease: Secondary | ICD-10-CM | POA: Diagnosis not present

## 2016-09-13 DIAGNOSIS — N186 End stage renal disease: Secondary | ICD-10-CM | POA: Diagnosis not present

## 2016-09-13 DIAGNOSIS — Z992 Dependence on renal dialysis: Secondary | ICD-10-CM | POA: Diagnosis not present

## 2016-09-13 DIAGNOSIS — D509 Iron deficiency anemia, unspecified: Secondary | ICD-10-CM | POA: Diagnosis not present

## 2016-09-13 DIAGNOSIS — D631 Anemia in chronic kidney disease: Secondary | ICD-10-CM | POA: Diagnosis not present

## 2016-09-15 DIAGNOSIS — D509 Iron deficiency anemia, unspecified: Secondary | ICD-10-CM | POA: Diagnosis not present

## 2016-09-15 DIAGNOSIS — N186 End stage renal disease: Secondary | ICD-10-CM | POA: Diagnosis not present

## 2016-09-15 DIAGNOSIS — Z992 Dependence on renal dialysis: Secondary | ICD-10-CM | POA: Diagnosis not present

## 2016-09-15 DIAGNOSIS — D631 Anemia in chronic kidney disease: Secondary | ICD-10-CM | POA: Diagnosis not present

## 2016-09-18 DIAGNOSIS — D631 Anemia in chronic kidney disease: Secondary | ICD-10-CM | POA: Diagnosis not present

## 2016-09-18 DIAGNOSIS — N186 End stage renal disease: Secondary | ICD-10-CM | POA: Diagnosis not present

## 2016-09-18 DIAGNOSIS — Z992 Dependence on renal dialysis: Secondary | ICD-10-CM | POA: Diagnosis not present

## 2016-09-18 DIAGNOSIS — D509 Iron deficiency anemia, unspecified: Secondary | ICD-10-CM | POA: Diagnosis not present

## 2016-09-20 DIAGNOSIS — N186 End stage renal disease: Secondary | ICD-10-CM | POA: Diagnosis not present

## 2016-09-20 DIAGNOSIS — Z992 Dependence on renal dialysis: Secondary | ICD-10-CM | POA: Diagnosis not present

## 2016-09-20 DIAGNOSIS — D631 Anemia in chronic kidney disease: Secondary | ICD-10-CM | POA: Diagnosis not present

## 2016-09-20 DIAGNOSIS — D509 Iron deficiency anemia, unspecified: Secondary | ICD-10-CM | POA: Diagnosis not present

## 2016-09-22 DIAGNOSIS — D509 Iron deficiency anemia, unspecified: Secondary | ICD-10-CM | POA: Diagnosis not present

## 2016-09-22 DIAGNOSIS — Z992 Dependence on renal dialysis: Secondary | ICD-10-CM | POA: Diagnosis not present

## 2016-09-22 DIAGNOSIS — D631 Anemia in chronic kidney disease: Secondary | ICD-10-CM | POA: Diagnosis not present

## 2016-09-22 DIAGNOSIS — N186 End stage renal disease: Secondary | ICD-10-CM | POA: Diagnosis not present

## 2016-09-25 DIAGNOSIS — N186 End stage renal disease: Secondary | ICD-10-CM | POA: Diagnosis not present

## 2016-09-25 DIAGNOSIS — D509 Iron deficiency anemia, unspecified: Secondary | ICD-10-CM | POA: Diagnosis not present

## 2016-09-25 DIAGNOSIS — D631 Anemia in chronic kidney disease: Secondary | ICD-10-CM | POA: Diagnosis not present

## 2016-09-25 DIAGNOSIS — Z992 Dependence on renal dialysis: Secondary | ICD-10-CM | POA: Diagnosis not present

## 2016-09-27 DIAGNOSIS — D631 Anemia in chronic kidney disease: Secondary | ICD-10-CM | POA: Diagnosis not present

## 2016-09-27 DIAGNOSIS — Z992 Dependence on renal dialysis: Secondary | ICD-10-CM | POA: Diagnosis not present

## 2016-09-27 DIAGNOSIS — D509 Iron deficiency anemia, unspecified: Secondary | ICD-10-CM | POA: Diagnosis not present

## 2016-09-27 DIAGNOSIS — N186 End stage renal disease: Secondary | ICD-10-CM | POA: Diagnosis not present

## 2016-09-29 DIAGNOSIS — D631 Anemia in chronic kidney disease: Secondary | ICD-10-CM | POA: Diagnosis not present

## 2016-09-29 DIAGNOSIS — Z992 Dependence on renal dialysis: Secondary | ICD-10-CM | POA: Diagnosis not present

## 2016-09-29 DIAGNOSIS — N186 End stage renal disease: Secondary | ICD-10-CM | POA: Diagnosis not present

## 2016-09-29 DIAGNOSIS — D509 Iron deficiency anemia, unspecified: Secondary | ICD-10-CM | POA: Diagnosis not present

## 2016-10-02 DIAGNOSIS — D509 Iron deficiency anemia, unspecified: Secondary | ICD-10-CM | POA: Diagnosis not present

## 2016-10-02 DIAGNOSIS — Z992 Dependence on renal dialysis: Secondary | ICD-10-CM | POA: Diagnosis not present

## 2016-10-02 DIAGNOSIS — N186 End stage renal disease: Secondary | ICD-10-CM | POA: Diagnosis not present

## 2016-10-02 DIAGNOSIS — D631 Anemia in chronic kidney disease: Secondary | ICD-10-CM | POA: Diagnosis not present

## 2016-10-04 DIAGNOSIS — D631 Anemia in chronic kidney disease: Secondary | ICD-10-CM | POA: Diagnosis not present

## 2016-10-04 DIAGNOSIS — Z992 Dependence on renal dialysis: Secondary | ICD-10-CM | POA: Diagnosis not present

## 2016-10-04 DIAGNOSIS — N186 End stage renal disease: Secondary | ICD-10-CM | POA: Diagnosis not present

## 2016-10-04 DIAGNOSIS — D509 Iron deficiency anemia, unspecified: Secondary | ICD-10-CM | POA: Diagnosis not present

## 2016-10-05 DIAGNOSIS — N186 End stage renal disease: Secondary | ICD-10-CM | POA: Diagnosis not present

## 2016-10-06 DIAGNOSIS — Z992 Dependence on renal dialysis: Secondary | ICD-10-CM | POA: Diagnosis not present

## 2016-10-06 DIAGNOSIS — D509 Iron deficiency anemia, unspecified: Secondary | ICD-10-CM | POA: Diagnosis not present

## 2016-10-06 DIAGNOSIS — D631 Anemia in chronic kidney disease: Secondary | ICD-10-CM | POA: Diagnosis not present

## 2016-10-06 DIAGNOSIS — N186 End stage renal disease: Secondary | ICD-10-CM | POA: Diagnosis not present

## 2016-10-09 DIAGNOSIS — Z992 Dependence on renal dialysis: Secondary | ICD-10-CM | POA: Diagnosis not present

## 2016-10-09 DIAGNOSIS — N186 End stage renal disease: Secondary | ICD-10-CM | POA: Diagnosis not present

## 2016-10-09 DIAGNOSIS — D509 Iron deficiency anemia, unspecified: Secondary | ICD-10-CM | POA: Diagnosis not present

## 2016-10-09 DIAGNOSIS — D631 Anemia in chronic kidney disease: Secondary | ICD-10-CM | POA: Diagnosis not present

## 2016-10-10 DIAGNOSIS — N186 End stage renal disease: Secondary | ICD-10-CM | POA: Diagnosis not present

## 2016-10-10 DIAGNOSIS — Z992 Dependence on renal dialysis: Secondary | ICD-10-CM | POA: Diagnosis not present

## 2016-10-11 DIAGNOSIS — N186 End stage renal disease: Secondary | ICD-10-CM | POA: Diagnosis not present

## 2016-10-11 DIAGNOSIS — D631 Anemia in chronic kidney disease: Secondary | ICD-10-CM | POA: Diagnosis not present

## 2016-10-11 DIAGNOSIS — N2581 Secondary hyperparathyroidism of renal origin: Secondary | ICD-10-CM | POA: Diagnosis not present

## 2016-10-11 DIAGNOSIS — D509 Iron deficiency anemia, unspecified: Secondary | ICD-10-CM | POA: Diagnosis not present

## 2016-10-11 DIAGNOSIS — Z992 Dependence on renal dialysis: Secondary | ICD-10-CM | POA: Diagnosis not present

## 2016-10-13 DIAGNOSIS — D631 Anemia in chronic kidney disease: Secondary | ICD-10-CM | POA: Diagnosis not present

## 2016-10-13 DIAGNOSIS — N2581 Secondary hyperparathyroidism of renal origin: Secondary | ICD-10-CM | POA: Diagnosis not present

## 2016-10-13 DIAGNOSIS — N186 End stage renal disease: Secondary | ICD-10-CM | POA: Diagnosis not present

## 2016-10-13 DIAGNOSIS — D509 Iron deficiency anemia, unspecified: Secondary | ICD-10-CM | POA: Diagnosis not present

## 2016-10-13 DIAGNOSIS — Z992 Dependence on renal dialysis: Secondary | ICD-10-CM | POA: Diagnosis not present

## 2016-10-16 DIAGNOSIS — D509 Iron deficiency anemia, unspecified: Secondary | ICD-10-CM | POA: Diagnosis not present

## 2016-10-16 DIAGNOSIS — N2581 Secondary hyperparathyroidism of renal origin: Secondary | ICD-10-CM | POA: Diagnosis not present

## 2016-10-16 DIAGNOSIS — N186 End stage renal disease: Secondary | ICD-10-CM | POA: Diagnosis not present

## 2016-10-16 DIAGNOSIS — D631 Anemia in chronic kidney disease: Secondary | ICD-10-CM | POA: Diagnosis not present

## 2016-10-16 DIAGNOSIS — Z992 Dependence on renal dialysis: Secondary | ICD-10-CM | POA: Diagnosis not present

## 2016-10-18 DIAGNOSIS — N186 End stage renal disease: Secondary | ICD-10-CM | POA: Diagnosis not present

## 2016-10-18 DIAGNOSIS — Z992 Dependence on renal dialysis: Secondary | ICD-10-CM | POA: Diagnosis not present

## 2016-10-18 DIAGNOSIS — D509 Iron deficiency anemia, unspecified: Secondary | ICD-10-CM | POA: Diagnosis not present

## 2016-10-18 DIAGNOSIS — D631 Anemia in chronic kidney disease: Secondary | ICD-10-CM | POA: Diagnosis not present

## 2016-10-18 DIAGNOSIS — N2581 Secondary hyperparathyroidism of renal origin: Secondary | ICD-10-CM | POA: Diagnosis not present

## 2016-10-19 DIAGNOSIS — N186 End stage renal disease: Secondary | ICD-10-CM | POA: Diagnosis not present

## 2016-10-20 DIAGNOSIS — D631 Anemia in chronic kidney disease: Secondary | ICD-10-CM | POA: Diagnosis not present

## 2016-10-20 DIAGNOSIS — N186 End stage renal disease: Secondary | ICD-10-CM | POA: Diagnosis not present

## 2016-10-20 DIAGNOSIS — Z992 Dependence on renal dialysis: Secondary | ICD-10-CM | POA: Diagnosis not present

## 2016-10-20 DIAGNOSIS — N2581 Secondary hyperparathyroidism of renal origin: Secondary | ICD-10-CM | POA: Diagnosis not present

## 2016-10-20 DIAGNOSIS — D509 Iron deficiency anemia, unspecified: Secondary | ICD-10-CM | POA: Diagnosis not present

## 2016-10-23 DIAGNOSIS — N2581 Secondary hyperparathyroidism of renal origin: Secondary | ICD-10-CM | POA: Diagnosis not present

## 2016-10-23 DIAGNOSIS — D509 Iron deficiency anemia, unspecified: Secondary | ICD-10-CM | POA: Diagnosis not present

## 2016-10-23 DIAGNOSIS — Z992 Dependence on renal dialysis: Secondary | ICD-10-CM | POA: Diagnosis not present

## 2016-10-23 DIAGNOSIS — N186 End stage renal disease: Secondary | ICD-10-CM | POA: Diagnosis not present

## 2016-10-23 DIAGNOSIS — D631 Anemia in chronic kidney disease: Secondary | ICD-10-CM | POA: Diagnosis not present

## 2016-10-25 DIAGNOSIS — N2581 Secondary hyperparathyroidism of renal origin: Secondary | ICD-10-CM | POA: Diagnosis not present

## 2016-10-25 DIAGNOSIS — D631 Anemia in chronic kidney disease: Secondary | ICD-10-CM | POA: Diagnosis not present

## 2016-10-25 DIAGNOSIS — Z992 Dependence on renal dialysis: Secondary | ICD-10-CM | POA: Diagnosis not present

## 2016-10-25 DIAGNOSIS — D509 Iron deficiency anemia, unspecified: Secondary | ICD-10-CM | POA: Diagnosis not present

## 2016-10-25 DIAGNOSIS — N186 End stage renal disease: Secondary | ICD-10-CM | POA: Diagnosis not present

## 2016-10-26 DIAGNOSIS — R410 Disorientation, unspecified: Secondary | ICD-10-CM | POA: Diagnosis not present

## 2016-10-26 DIAGNOSIS — E8770 Fluid overload, unspecified: Secondary | ICD-10-CM | POA: Diagnosis not present

## 2016-10-26 DIAGNOSIS — N186 End stage renal disease: Secondary | ICD-10-CM | POA: Diagnosis not present

## 2016-10-26 DIAGNOSIS — Z992 Dependence on renal dialysis: Secondary | ICD-10-CM | POA: Diagnosis not present

## 2016-10-30 DIAGNOSIS — N186 End stage renal disease: Secondary | ICD-10-CM | POA: Diagnosis not present

## 2016-10-30 DIAGNOSIS — N2581 Secondary hyperparathyroidism of renal origin: Secondary | ICD-10-CM | POA: Diagnosis not present

## 2016-10-30 DIAGNOSIS — D631 Anemia in chronic kidney disease: Secondary | ICD-10-CM | POA: Diagnosis not present

## 2016-10-30 DIAGNOSIS — Z992 Dependence on renal dialysis: Secondary | ICD-10-CM | POA: Diagnosis not present

## 2016-10-30 DIAGNOSIS — D509 Iron deficiency anemia, unspecified: Secondary | ICD-10-CM | POA: Diagnosis not present

## 2016-10-31 DIAGNOSIS — E039 Hypothyroidism, unspecified: Secondary | ICD-10-CM | POA: Diagnosis not present

## 2016-10-31 DIAGNOSIS — E1122 Type 2 diabetes mellitus with diabetic chronic kidney disease: Secondary | ICD-10-CM | POA: Diagnosis not present

## 2016-10-31 DIAGNOSIS — R296 Repeated falls: Secondary | ICD-10-CM | POA: Diagnosis not present

## 2016-11-01 DIAGNOSIS — D509 Iron deficiency anemia, unspecified: Secondary | ICD-10-CM | POA: Diagnosis not present

## 2016-11-01 DIAGNOSIS — N2581 Secondary hyperparathyroidism of renal origin: Secondary | ICD-10-CM | POA: Diagnosis not present

## 2016-11-01 DIAGNOSIS — Z992 Dependence on renal dialysis: Secondary | ICD-10-CM | POA: Diagnosis not present

## 2016-11-01 DIAGNOSIS — D631 Anemia in chronic kidney disease: Secondary | ICD-10-CM | POA: Diagnosis not present

## 2016-11-01 DIAGNOSIS — N186 End stage renal disease: Secondary | ICD-10-CM | POA: Diagnosis not present

## 2016-11-03 DIAGNOSIS — N2581 Secondary hyperparathyroidism of renal origin: Secondary | ICD-10-CM | POA: Diagnosis not present

## 2016-11-03 DIAGNOSIS — D509 Iron deficiency anemia, unspecified: Secondary | ICD-10-CM | POA: Diagnosis not present

## 2016-11-03 DIAGNOSIS — Z992 Dependence on renal dialysis: Secondary | ICD-10-CM | POA: Diagnosis not present

## 2016-11-03 DIAGNOSIS — N186 End stage renal disease: Secondary | ICD-10-CM | POA: Diagnosis not present

## 2016-11-03 DIAGNOSIS — D631 Anemia in chronic kidney disease: Secondary | ICD-10-CM | POA: Diagnosis not present

## 2016-11-05 DIAGNOSIS — N185 Chronic kidney disease, stage 5: Secondary | ICD-10-CM | POA: Diagnosis not present

## 2016-11-05 DIAGNOSIS — L89612 Pressure ulcer of right heel, stage 2: Secondary | ICD-10-CM | POA: Diagnosis not present

## 2016-11-05 DIAGNOSIS — I890 Lymphedema, not elsewhere classified: Secondary | ICD-10-CM | POA: Diagnosis not present

## 2016-11-05 DIAGNOSIS — L89620 Pressure ulcer of left heel, unstageable: Secondary | ICD-10-CM | POA: Diagnosis not present

## 2016-11-06 DIAGNOSIS — N2581 Secondary hyperparathyroidism of renal origin: Secondary | ICD-10-CM | POA: Diagnosis not present

## 2016-11-06 DIAGNOSIS — E119 Type 2 diabetes mellitus without complications: Secondary | ICD-10-CM | POA: Diagnosis not present

## 2016-11-06 DIAGNOSIS — D631 Anemia in chronic kidney disease: Secondary | ICD-10-CM | POA: Diagnosis not present

## 2016-11-06 DIAGNOSIS — D509 Iron deficiency anemia, unspecified: Secondary | ICD-10-CM | POA: Diagnosis not present

## 2016-11-06 DIAGNOSIS — N186 End stage renal disease: Secondary | ICD-10-CM | POA: Diagnosis not present

## 2016-11-06 DIAGNOSIS — Z992 Dependence on renal dialysis: Secondary | ICD-10-CM | POA: Diagnosis not present

## 2016-11-06 DIAGNOSIS — Z794 Long term (current) use of insulin: Secondary | ICD-10-CM | POA: Diagnosis not present

## 2016-11-07 DIAGNOSIS — F39 Unspecified mood [affective] disorder: Secondary | ICD-10-CM | POA: Diagnosis not present

## 2016-11-07 DIAGNOSIS — F329 Major depressive disorder, single episode, unspecified: Secondary | ICD-10-CM | POA: Diagnosis not present

## 2016-11-07 DIAGNOSIS — G4700 Insomnia, unspecified: Secondary | ICD-10-CM | POA: Diagnosis not present

## 2016-11-07 DIAGNOSIS — F411 Generalized anxiety disorder: Secondary | ICD-10-CM | POA: Diagnosis not present

## 2016-11-08 DIAGNOSIS — N2581 Secondary hyperparathyroidism of renal origin: Secondary | ICD-10-CM | POA: Diagnosis not present

## 2016-11-08 DIAGNOSIS — D631 Anemia in chronic kidney disease: Secondary | ICD-10-CM | POA: Diagnosis not present

## 2016-11-08 DIAGNOSIS — Z992 Dependence on renal dialysis: Secondary | ICD-10-CM | POA: Diagnosis not present

## 2016-11-08 DIAGNOSIS — N186 End stage renal disease: Secondary | ICD-10-CM | POA: Diagnosis not present

## 2016-11-08 DIAGNOSIS — D509 Iron deficiency anemia, unspecified: Secondary | ICD-10-CM | POA: Diagnosis not present

## 2016-11-10 DIAGNOSIS — D631 Anemia in chronic kidney disease: Secondary | ICD-10-CM | POA: Diagnosis not present

## 2016-11-10 DIAGNOSIS — N186 End stage renal disease: Secondary | ICD-10-CM | POA: Diagnosis not present

## 2016-11-10 DIAGNOSIS — Z992 Dependence on renal dialysis: Secondary | ICD-10-CM | POA: Diagnosis not present

## 2016-11-10 DIAGNOSIS — D509 Iron deficiency anemia, unspecified: Secondary | ICD-10-CM | POA: Diagnosis not present

## 2016-11-10 DIAGNOSIS — I517 Cardiomegaly: Secondary | ICD-10-CM | POA: Diagnosis not present

## 2016-11-10 DIAGNOSIS — N2581 Secondary hyperparathyroidism of renal origin: Secondary | ICD-10-CM | POA: Diagnosis not present

## 2016-11-13 DIAGNOSIS — D631 Anemia in chronic kidney disease: Secondary | ICD-10-CM | POA: Diagnosis not present

## 2016-11-13 DIAGNOSIS — N2581 Secondary hyperparathyroidism of renal origin: Secondary | ICD-10-CM | POA: Diagnosis not present

## 2016-11-13 DIAGNOSIS — D509 Iron deficiency anemia, unspecified: Secondary | ICD-10-CM | POA: Diagnosis not present

## 2016-11-13 DIAGNOSIS — Z992 Dependence on renal dialysis: Secondary | ICD-10-CM | POA: Diagnosis not present

## 2016-11-13 DIAGNOSIS — N186 End stage renal disease: Secondary | ICD-10-CM | POA: Diagnosis not present

## 2016-11-15 DIAGNOSIS — D631 Anemia in chronic kidney disease: Secondary | ICD-10-CM | POA: Diagnosis not present

## 2016-11-15 DIAGNOSIS — N186 End stage renal disease: Secondary | ICD-10-CM | POA: Diagnosis not present

## 2016-11-15 DIAGNOSIS — Z992 Dependence on renal dialysis: Secondary | ICD-10-CM | POA: Diagnosis not present

## 2016-11-15 DIAGNOSIS — D509 Iron deficiency anemia, unspecified: Secondary | ICD-10-CM | POA: Diagnosis not present

## 2016-11-15 DIAGNOSIS — N2581 Secondary hyperparathyroidism of renal origin: Secondary | ICD-10-CM | POA: Diagnosis not present

## 2016-11-17 DIAGNOSIS — D509 Iron deficiency anemia, unspecified: Secondary | ICD-10-CM | POA: Diagnosis not present

## 2016-11-17 DIAGNOSIS — N2581 Secondary hyperparathyroidism of renal origin: Secondary | ICD-10-CM | POA: Diagnosis not present

## 2016-11-17 DIAGNOSIS — D631 Anemia in chronic kidney disease: Secondary | ICD-10-CM | POA: Diagnosis not present

## 2016-11-17 DIAGNOSIS — Z992 Dependence on renal dialysis: Secondary | ICD-10-CM | POA: Diagnosis not present

## 2016-11-17 DIAGNOSIS — N186 End stage renal disease: Secondary | ICD-10-CM | POA: Diagnosis not present

## 2016-11-20 DIAGNOSIS — N186 End stage renal disease: Secondary | ICD-10-CM | POA: Diagnosis not present

## 2016-11-20 DIAGNOSIS — Z992 Dependence on renal dialysis: Secondary | ICD-10-CM | POA: Diagnosis not present

## 2016-11-20 DIAGNOSIS — N2581 Secondary hyperparathyroidism of renal origin: Secondary | ICD-10-CM | POA: Diagnosis not present

## 2016-11-20 DIAGNOSIS — D509 Iron deficiency anemia, unspecified: Secondary | ICD-10-CM | POA: Diagnosis not present

## 2016-11-20 DIAGNOSIS — D631 Anemia in chronic kidney disease: Secondary | ICD-10-CM | POA: Diagnosis not present

## 2016-11-21 DIAGNOSIS — F411 Generalized anxiety disorder: Secondary | ICD-10-CM | POA: Diagnosis not present

## 2016-11-21 DIAGNOSIS — F329 Major depressive disorder, single episode, unspecified: Secondary | ICD-10-CM | POA: Diagnosis not present

## 2016-11-21 DIAGNOSIS — G4700 Insomnia, unspecified: Secondary | ICD-10-CM | POA: Diagnosis not present

## 2016-11-21 DIAGNOSIS — F39 Unspecified mood [affective] disorder: Secondary | ICD-10-CM | POA: Diagnosis not present

## 2016-11-22 DIAGNOSIS — D631 Anemia in chronic kidney disease: Secondary | ICD-10-CM | POA: Diagnosis not present

## 2016-11-22 DIAGNOSIS — N2581 Secondary hyperparathyroidism of renal origin: Secondary | ICD-10-CM | POA: Diagnosis not present

## 2016-11-22 DIAGNOSIS — Z992 Dependence on renal dialysis: Secondary | ICD-10-CM | POA: Diagnosis not present

## 2016-11-22 DIAGNOSIS — D509 Iron deficiency anemia, unspecified: Secondary | ICD-10-CM | POA: Diagnosis not present

## 2016-11-22 DIAGNOSIS — N186 End stage renal disease: Secondary | ICD-10-CM | POA: Diagnosis not present

## 2016-11-24 DIAGNOSIS — Z992 Dependence on renal dialysis: Secondary | ICD-10-CM | POA: Diagnosis not present

## 2016-11-24 DIAGNOSIS — N186 End stage renal disease: Secondary | ICD-10-CM | POA: Diagnosis not present

## 2016-11-24 DIAGNOSIS — D631 Anemia in chronic kidney disease: Secondary | ICD-10-CM | POA: Diagnosis not present

## 2016-11-24 DIAGNOSIS — D509 Iron deficiency anemia, unspecified: Secondary | ICD-10-CM | POA: Diagnosis not present

## 2016-11-24 DIAGNOSIS — N2581 Secondary hyperparathyroidism of renal origin: Secondary | ICD-10-CM | POA: Diagnosis not present

## 2016-11-26 DIAGNOSIS — E1122 Type 2 diabetes mellitus with diabetic chronic kidney disease: Secondary | ICD-10-CM | POA: Diagnosis not present

## 2016-11-26 DIAGNOSIS — L89620 Pressure ulcer of left heel, unstageable: Secondary | ICD-10-CM | POA: Diagnosis not present

## 2016-11-26 DIAGNOSIS — N186 End stage renal disease: Secondary | ICD-10-CM | POA: Diagnosis not present

## 2016-11-26 DIAGNOSIS — L89612 Pressure ulcer of right heel, stage 2: Secondary | ICD-10-CM | POA: Diagnosis not present

## 2016-11-27 DIAGNOSIS — N390 Urinary tract infection, site not specified: Secondary | ICD-10-CM | POA: Diagnosis not present

## 2016-11-27 DIAGNOSIS — D631 Anemia in chronic kidney disease: Secondary | ICD-10-CM | POA: Diagnosis not present

## 2016-11-27 DIAGNOSIS — D509 Iron deficiency anemia, unspecified: Secondary | ICD-10-CM | POA: Diagnosis not present

## 2016-11-27 DIAGNOSIS — N186 End stage renal disease: Secondary | ICD-10-CM | POA: Diagnosis not present

## 2016-11-27 DIAGNOSIS — N2581 Secondary hyperparathyroidism of renal origin: Secondary | ICD-10-CM | POA: Diagnosis not present

## 2016-11-27 DIAGNOSIS — Z992 Dependence on renal dialysis: Secondary | ICD-10-CM | POA: Diagnosis not present

## 2016-11-28 DIAGNOSIS — R52 Pain, unspecified: Secondary | ICD-10-CM | POA: Diagnosis not present

## 2016-11-29 DIAGNOSIS — N2581 Secondary hyperparathyroidism of renal origin: Secondary | ICD-10-CM | POA: Diagnosis not present

## 2016-11-29 DIAGNOSIS — Z992 Dependence on renal dialysis: Secondary | ICD-10-CM | POA: Diagnosis not present

## 2016-11-29 DIAGNOSIS — N186 End stage renal disease: Secondary | ICD-10-CM | POA: Diagnosis not present

## 2016-11-29 DIAGNOSIS — D631 Anemia in chronic kidney disease: Secondary | ICD-10-CM | POA: Diagnosis not present

## 2016-11-29 DIAGNOSIS — D509 Iron deficiency anemia, unspecified: Secondary | ICD-10-CM | POA: Diagnosis not present

## 2016-12-01 DIAGNOSIS — D631 Anemia in chronic kidney disease: Secondary | ICD-10-CM | POA: Diagnosis not present

## 2016-12-01 DIAGNOSIS — N186 End stage renal disease: Secondary | ICD-10-CM | POA: Diagnosis not present

## 2016-12-01 DIAGNOSIS — N2581 Secondary hyperparathyroidism of renal origin: Secondary | ICD-10-CM | POA: Diagnosis not present

## 2016-12-01 DIAGNOSIS — D509 Iron deficiency anemia, unspecified: Secondary | ICD-10-CM | POA: Diagnosis not present

## 2016-12-01 DIAGNOSIS — Z992 Dependence on renal dialysis: Secondary | ICD-10-CM | POA: Diagnosis not present

## 2016-12-04 DIAGNOSIS — Z992 Dependence on renal dialysis: Secondary | ICD-10-CM | POA: Diagnosis not present

## 2016-12-04 DIAGNOSIS — N2581 Secondary hyperparathyroidism of renal origin: Secondary | ICD-10-CM | POA: Diagnosis not present

## 2016-12-04 DIAGNOSIS — D631 Anemia in chronic kidney disease: Secondary | ICD-10-CM | POA: Diagnosis not present

## 2016-12-04 DIAGNOSIS — N186 End stage renal disease: Secondary | ICD-10-CM | POA: Diagnosis not present

## 2016-12-04 DIAGNOSIS — D509 Iron deficiency anemia, unspecified: Secondary | ICD-10-CM | POA: Diagnosis not present

## 2016-12-06 DIAGNOSIS — D509 Iron deficiency anemia, unspecified: Secondary | ICD-10-CM | POA: Diagnosis not present

## 2016-12-06 DIAGNOSIS — D631 Anemia in chronic kidney disease: Secondary | ICD-10-CM | POA: Diagnosis not present

## 2016-12-06 DIAGNOSIS — N2581 Secondary hyperparathyroidism of renal origin: Secondary | ICD-10-CM | POA: Diagnosis not present

## 2016-12-06 DIAGNOSIS — N186 End stage renal disease: Secondary | ICD-10-CM | POA: Diagnosis not present

## 2016-12-06 DIAGNOSIS — Z992 Dependence on renal dialysis: Secondary | ICD-10-CM | POA: Diagnosis not present

## 2016-12-07 DIAGNOSIS — F39 Unspecified mood [affective] disorder: Secondary | ICD-10-CM | POA: Diagnosis not present

## 2016-12-07 DIAGNOSIS — F330 Major depressive disorder, recurrent, mild: Secondary | ICD-10-CM | POA: Diagnosis not present

## 2016-12-07 DIAGNOSIS — F5104 Psychophysiologic insomnia: Secondary | ICD-10-CM | POA: Diagnosis not present

## 2016-12-08 DIAGNOSIS — N186 End stage renal disease: Secondary | ICD-10-CM | POA: Diagnosis not present

## 2016-12-08 DIAGNOSIS — D631 Anemia in chronic kidney disease: Secondary | ICD-10-CM | POA: Diagnosis not present

## 2016-12-08 DIAGNOSIS — N2581 Secondary hyperparathyroidism of renal origin: Secondary | ICD-10-CM | POA: Diagnosis not present

## 2016-12-08 DIAGNOSIS — D509 Iron deficiency anemia, unspecified: Secondary | ICD-10-CM | POA: Diagnosis not present

## 2016-12-08 DIAGNOSIS — Z992 Dependence on renal dialysis: Secondary | ICD-10-CM | POA: Diagnosis not present

## 2016-12-10 DIAGNOSIS — N186 End stage renal disease: Secondary | ICD-10-CM | POA: Diagnosis not present

## 2016-12-10 DIAGNOSIS — Z992 Dependence on renal dialysis: Secondary | ICD-10-CM | POA: Diagnosis not present

## 2016-12-11 DIAGNOSIS — Z992 Dependence on renal dialysis: Secondary | ICD-10-CM | POA: Diagnosis not present

## 2016-12-11 DIAGNOSIS — M545 Low back pain: Secondary | ICD-10-CM | POA: Diagnosis not present

## 2016-12-11 DIAGNOSIS — M6281 Muscle weakness (generalized): Secondary | ICD-10-CM | POA: Diagnosis not present

## 2016-12-11 DIAGNOSIS — L97429 Non-pressure chronic ulcer of left heel and midfoot with unspecified severity: Secondary | ICD-10-CM | POA: Diagnosis not present

## 2016-12-11 DIAGNOSIS — R296 Repeated falls: Secondary | ICD-10-CM | POA: Diagnosis not present

## 2016-12-11 DIAGNOSIS — L89629 Pressure ulcer of left heel, unspecified stage: Secondary | ICD-10-CM | POA: Diagnosis not present

## 2016-12-11 DIAGNOSIS — L89619 Pressure ulcer of right heel, unspecified stage: Secondary | ICD-10-CM | POA: Diagnosis not present

## 2016-12-11 DIAGNOSIS — D509 Iron deficiency anemia, unspecified: Secondary | ICD-10-CM | POA: Diagnosis not present

## 2016-12-11 DIAGNOSIS — N186 End stage renal disease: Secondary | ICD-10-CM | POA: Diagnosis not present

## 2016-12-11 DIAGNOSIS — R2681 Unsteadiness on feet: Secondary | ICD-10-CM | POA: Diagnosis not present

## 2016-12-11 DIAGNOSIS — R2689 Other abnormalities of gait and mobility: Secondary | ICD-10-CM | POA: Diagnosis not present

## 2016-12-11 DIAGNOSIS — D631 Anemia in chronic kidney disease: Secondary | ICD-10-CM | POA: Diagnosis not present

## 2016-12-11 DIAGNOSIS — N185 Chronic kidney disease, stage 5: Secondary | ICD-10-CM | POA: Diagnosis not present

## 2016-12-11 DIAGNOSIS — B9561 Methicillin susceptible Staphylococcus aureus infection as the cause of diseases classified elsewhere: Secondary | ICD-10-CM | POA: Diagnosis not present

## 2016-12-12 DIAGNOSIS — R2681 Unsteadiness on feet: Secondary | ICD-10-CM | POA: Diagnosis not present

## 2016-12-12 DIAGNOSIS — M6281 Muscle weakness (generalized): Secondary | ICD-10-CM | POA: Diagnosis not present

## 2016-12-12 DIAGNOSIS — M545 Low back pain: Secondary | ICD-10-CM | POA: Diagnosis not present

## 2016-12-12 DIAGNOSIS — N185 Chronic kidney disease, stage 5: Secondary | ICD-10-CM | POA: Diagnosis not present

## 2016-12-12 DIAGNOSIS — R2689 Other abnormalities of gait and mobility: Secondary | ICD-10-CM | POA: Diagnosis not present

## 2016-12-12 DIAGNOSIS — R296 Repeated falls: Secondary | ICD-10-CM | POA: Diagnosis not present

## 2016-12-13 DIAGNOSIS — D631 Anemia in chronic kidney disease: Secondary | ICD-10-CM | POA: Diagnosis not present

## 2016-12-13 DIAGNOSIS — L89619 Pressure ulcer of right heel, unspecified stage: Secondary | ICD-10-CM | POA: Diagnosis not present

## 2016-12-13 DIAGNOSIS — L89629 Pressure ulcer of left heel, unspecified stage: Secondary | ICD-10-CM | POA: Diagnosis not present

## 2016-12-13 DIAGNOSIS — L97429 Non-pressure chronic ulcer of left heel and midfoot with unspecified severity: Secondary | ICD-10-CM | POA: Diagnosis not present

## 2016-12-13 DIAGNOSIS — N186 End stage renal disease: Secondary | ICD-10-CM | POA: Diagnosis not present

## 2016-12-13 DIAGNOSIS — D509 Iron deficiency anemia, unspecified: Secondary | ICD-10-CM | POA: Diagnosis not present

## 2016-12-14 DIAGNOSIS — M6281 Muscle weakness (generalized): Secondary | ICD-10-CM | POA: Diagnosis not present

## 2016-12-14 DIAGNOSIS — R2689 Other abnormalities of gait and mobility: Secondary | ICD-10-CM | POA: Diagnosis not present

## 2016-12-14 DIAGNOSIS — B351 Tinea unguium: Secondary | ICD-10-CM | POA: Diagnosis not present

## 2016-12-14 DIAGNOSIS — N185 Chronic kidney disease, stage 5: Secondary | ICD-10-CM | POA: Diagnosis not present

## 2016-12-14 DIAGNOSIS — R296 Repeated falls: Secondary | ICD-10-CM | POA: Diagnosis not present

## 2016-12-14 DIAGNOSIS — M2042 Other hammer toe(s) (acquired), left foot: Secondary | ICD-10-CM | POA: Diagnosis not present

## 2016-12-14 DIAGNOSIS — E1140 Type 2 diabetes mellitus with diabetic neuropathy, unspecified: Secondary | ICD-10-CM | POA: Diagnosis not present

## 2016-12-14 DIAGNOSIS — R2681 Unsteadiness on feet: Secondary | ICD-10-CM | POA: Diagnosis not present

## 2016-12-14 DIAGNOSIS — M545 Low back pain: Secondary | ICD-10-CM | POA: Diagnosis not present

## 2016-12-15 DIAGNOSIS — L89619 Pressure ulcer of right heel, unspecified stage: Secondary | ICD-10-CM | POA: Diagnosis not present

## 2016-12-15 DIAGNOSIS — D509 Iron deficiency anemia, unspecified: Secondary | ICD-10-CM | POA: Diagnosis not present

## 2016-12-15 DIAGNOSIS — D631 Anemia in chronic kidney disease: Secondary | ICD-10-CM | POA: Diagnosis not present

## 2016-12-15 DIAGNOSIS — L97429 Non-pressure chronic ulcer of left heel and midfoot with unspecified severity: Secondary | ICD-10-CM | POA: Diagnosis not present

## 2016-12-15 DIAGNOSIS — N186 End stage renal disease: Secondary | ICD-10-CM | POA: Diagnosis not present

## 2016-12-15 DIAGNOSIS — L89629 Pressure ulcer of left heel, unspecified stage: Secondary | ICD-10-CM | POA: Diagnosis not present

## 2016-12-17 DIAGNOSIS — R296 Repeated falls: Secondary | ICD-10-CM | POA: Diagnosis not present

## 2016-12-17 DIAGNOSIS — R2689 Other abnormalities of gait and mobility: Secondary | ICD-10-CM | POA: Diagnosis not present

## 2016-12-17 DIAGNOSIS — M6281 Muscle weakness (generalized): Secondary | ICD-10-CM | POA: Diagnosis not present

## 2016-12-17 DIAGNOSIS — M545 Low back pain: Secondary | ICD-10-CM | POA: Diagnosis not present

## 2016-12-17 DIAGNOSIS — N185 Chronic kidney disease, stage 5: Secondary | ICD-10-CM | POA: Diagnosis not present

## 2016-12-17 DIAGNOSIS — R2681 Unsteadiness on feet: Secondary | ICD-10-CM | POA: Diagnosis not present

## 2016-12-18 DIAGNOSIS — D509 Iron deficiency anemia, unspecified: Secondary | ICD-10-CM | POA: Diagnosis not present

## 2016-12-18 DIAGNOSIS — L89629 Pressure ulcer of left heel, unspecified stage: Secondary | ICD-10-CM | POA: Diagnosis not present

## 2016-12-18 DIAGNOSIS — L97429 Non-pressure chronic ulcer of left heel and midfoot with unspecified severity: Secondary | ICD-10-CM | POA: Diagnosis not present

## 2016-12-18 DIAGNOSIS — N186 End stage renal disease: Secondary | ICD-10-CM | POA: Diagnosis not present

## 2016-12-18 DIAGNOSIS — L89619 Pressure ulcer of right heel, unspecified stage: Secondary | ICD-10-CM | POA: Diagnosis not present

## 2016-12-18 DIAGNOSIS — D631 Anemia in chronic kidney disease: Secondary | ICD-10-CM | POA: Diagnosis not present

## 2016-12-20 DIAGNOSIS — L97429 Non-pressure chronic ulcer of left heel and midfoot with unspecified severity: Secondary | ICD-10-CM | POA: Diagnosis not present

## 2016-12-20 DIAGNOSIS — L89619 Pressure ulcer of right heel, unspecified stage: Secondary | ICD-10-CM | POA: Diagnosis not present

## 2016-12-20 DIAGNOSIS — R296 Repeated falls: Secondary | ICD-10-CM | POA: Diagnosis not present

## 2016-12-20 DIAGNOSIS — M6281 Muscle weakness (generalized): Secondary | ICD-10-CM | POA: Diagnosis not present

## 2016-12-20 DIAGNOSIS — N186 End stage renal disease: Secondary | ICD-10-CM | POA: Diagnosis not present

## 2016-12-20 DIAGNOSIS — L89629 Pressure ulcer of left heel, unspecified stage: Secondary | ICD-10-CM | POA: Diagnosis not present

## 2016-12-20 DIAGNOSIS — N185 Chronic kidney disease, stage 5: Secondary | ICD-10-CM | POA: Diagnosis not present

## 2016-12-20 DIAGNOSIS — R2689 Other abnormalities of gait and mobility: Secondary | ICD-10-CM | POA: Diagnosis not present

## 2016-12-20 DIAGNOSIS — R2681 Unsteadiness on feet: Secondary | ICD-10-CM | POA: Diagnosis not present

## 2016-12-20 DIAGNOSIS — D631 Anemia in chronic kidney disease: Secondary | ICD-10-CM | POA: Diagnosis not present

## 2016-12-20 DIAGNOSIS — D509 Iron deficiency anemia, unspecified: Secondary | ICD-10-CM | POA: Diagnosis not present

## 2016-12-20 DIAGNOSIS — M545 Low back pain: Secondary | ICD-10-CM | POA: Diagnosis not present

## 2016-12-21 DIAGNOSIS — R2681 Unsteadiness on feet: Secondary | ICD-10-CM | POA: Diagnosis not present

## 2016-12-21 DIAGNOSIS — M6281 Muscle weakness (generalized): Secondary | ICD-10-CM | POA: Diagnosis not present

## 2016-12-21 DIAGNOSIS — N185 Chronic kidney disease, stage 5: Secondary | ICD-10-CM | POA: Diagnosis not present

## 2016-12-21 DIAGNOSIS — R2689 Other abnormalities of gait and mobility: Secondary | ICD-10-CM | POA: Diagnosis not present

## 2016-12-21 DIAGNOSIS — R296 Repeated falls: Secondary | ICD-10-CM | POA: Diagnosis not present

## 2016-12-21 DIAGNOSIS — M545 Low back pain: Secondary | ICD-10-CM | POA: Diagnosis not present

## 2016-12-22 DIAGNOSIS — N186 End stage renal disease: Secondary | ICD-10-CM | POA: Diagnosis not present

## 2016-12-22 DIAGNOSIS — M47816 Spondylosis without myelopathy or radiculopathy, lumbar region: Secondary | ICD-10-CM | POA: Diagnosis not present

## 2016-12-22 DIAGNOSIS — L97429 Non-pressure chronic ulcer of left heel and midfoot with unspecified severity: Secondary | ICD-10-CM | POA: Diagnosis not present

## 2016-12-22 DIAGNOSIS — L89619 Pressure ulcer of right heel, unspecified stage: Secondary | ICD-10-CM | POA: Diagnosis not present

## 2016-12-22 DIAGNOSIS — L89629 Pressure ulcer of left heel, unspecified stage: Secondary | ICD-10-CM | POA: Diagnosis not present

## 2016-12-22 DIAGNOSIS — D631 Anemia in chronic kidney disease: Secondary | ICD-10-CM | POA: Diagnosis not present

## 2016-12-22 DIAGNOSIS — D509 Iron deficiency anemia, unspecified: Secondary | ICD-10-CM | POA: Diagnosis not present

## 2016-12-22 DIAGNOSIS — E1142 Type 2 diabetes mellitus with diabetic polyneuropathy: Secondary | ICD-10-CM | POA: Diagnosis not present

## 2016-12-24 DIAGNOSIS — M6281 Muscle weakness (generalized): Secondary | ICD-10-CM | POA: Diagnosis not present

## 2016-12-24 DIAGNOSIS — R296 Repeated falls: Secondary | ICD-10-CM | POA: Diagnosis not present

## 2016-12-24 DIAGNOSIS — M545 Low back pain: Secondary | ICD-10-CM | POA: Diagnosis not present

## 2016-12-24 DIAGNOSIS — R2681 Unsteadiness on feet: Secondary | ICD-10-CM | POA: Diagnosis not present

## 2016-12-24 DIAGNOSIS — N185 Chronic kidney disease, stage 5: Secondary | ICD-10-CM | POA: Diagnosis not present

## 2016-12-24 DIAGNOSIS — R2689 Other abnormalities of gait and mobility: Secondary | ICD-10-CM | POA: Diagnosis not present

## 2016-12-25 DIAGNOSIS — M545 Low back pain: Secondary | ICD-10-CM | POA: Diagnosis not present

## 2016-12-25 DIAGNOSIS — R2681 Unsteadiness on feet: Secondary | ICD-10-CM | POA: Diagnosis not present

## 2016-12-25 DIAGNOSIS — D509 Iron deficiency anemia, unspecified: Secondary | ICD-10-CM | POA: Diagnosis not present

## 2016-12-25 DIAGNOSIS — R296 Repeated falls: Secondary | ICD-10-CM | POA: Diagnosis not present

## 2016-12-25 DIAGNOSIS — D631 Anemia in chronic kidney disease: Secondary | ICD-10-CM | POA: Diagnosis not present

## 2016-12-25 DIAGNOSIS — N185 Chronic kidney disease, stage 5: Secondary | ICD-10-CM | POA: Diagnosis not present

## 2016-12-25 DIAGNOSIS — L89619 Pressure ulcer of right heel, unspecified stage: Secondary | ICD-10-CM | POA: Diagnosis not present

## 2016-12-25 DIAGNOSIS — L89629 Pressure ulcer of left heel, unspecified stage: Secondary | ICD-10-CM | POA: Diagnosis not present

## 2016-12-25 DIAGNOSIS — R2689 Other abnormalities of gait and mobility: Secondary | ICD-10-CM | POA: Diagnosis not present

## 2016-12-25 DIAGNOSIS — M6281 Muscle weakness (generalized): Secondary | ICD-10-CM | POA: Diagnosis not present

## 2016-12-25 DIAGNOSIS — N186 End stage renal disease: Secondary | ICD-10-CM | POA: Diagnosis not present

## 2016-12-25 DIAGNOSIS — L97429 Non-pressure chronic ulcer of left heel and midfoot with unspecified severity: Secondary | ICD-10-CM | POA: Diagnosis not present

## 2016-12-26 DIAGNOSIS — N185 Chronic kidney disease, stage 5: Secondary | ICD-10-CM | POA: Diagnosis not present

## 2016-12-26 DIAGNOSIS — E1140 Type 2 diabetes mellitus with diabetic neuropathy, unspecified: Secondary | ICD-10-CM | POA: Diagnosis not present

## 2016-12-26 DIAGNOSIS — R2681 Unsteadiness on feet: Secondary | ICD-10-CM | POA: Diagnosis not present

## 2016-12-26 DIAGNOSIS — M6281 Muscle weakness (generalized): Secondary | ICD-10-CM | POA: Diagnosis not present

## 2016-12-26 DIAGNOSIS — M545 Low back pain: Secondary | ICD-10-CM | POA: Diagnosis not present

## 2016-12-26 DIAGNOSIS — K219 Gastro-esophageal reflux disease without esophagitis: Secondary | ICD-10-CM | POA: Diagnosis not present

## 2016-12-26 DIAGNOSIS — R2689 Other abnormalities of gait and mobility: Secondary | ICD-10-CM | POA: Diagnosis not present

## 2016-12-26 DIAGNOSIS — R296 Repeated falls: Secondary | ICD-10-CM | POA: Diagnosis not present

## 2016-12-26 DIAGNOSIS — E039 Hypothyroidism, unspecified: Secondary | ICD-10-CM | POA: Diagnosis not present

## 2016-12-27 DIAGNOSIS — D509 Iron deficiency anemia, unspecified: Secondary | ICD-10-CM | POA: Diagnosis not present

## 2016-12-27 DIAGNOSIS — N186 End stage renal disease: Secondary | ICD-10-CM | POA: Diagnosis not present

## 2016-12-27 DIAGNOSIS — M545 Low back pain: Secondary | ICD-10-CM | POA: Diagnosis not present

## 2016-12-27 DIAGNOSIS — L97429 Non-pressure chronic ulcer of left heel and midfoot with unspecified severity: Secondary | ICD-10-CM | POA: Diagnosis not present

## 2016-12-27 DIAGNOSIS — R2681 Unsteadiness on feet: Secondary | ICD-10-CM | POA: Diagnosis not present

## 2016-12-27 DIAGNOSIS — D631 Anemia in chronic kidney disease: Secondary | ICD-10-CM | POA: Diagnosis not present

## 2016-12-27 DIAGNOSIS — N185 Chronic kidney disease, stage 5: Secondary | ICD-10-CM | POA: Diagnosis not present

## 2016-12-27 DIAGNOSIS — M6281 Muscle weakness (generalized): Secondary | ICD-10-CM | POA: Diagnosis not present

## 2016-12-27 DIAGNOSIS — R2689 Other abnormalities of gait and mobility: Secondary | ICD-10-CM | POA: Diagnosis not present

## 2016-12-27 DIAGNOSIS — R296 Repeated falls: Secondary | ICD-10-CM | POA: Diagnosis not present

## 2016-12-27 DIAGNOSIS — L89629 Pressure ulcer of left heel, unspecified stage: Secondary | ICD-10-CM | POA: Diagnosis not present

## 2016-12-27 DIAGNOSIS — L89619 Pressure ulcer of right heel, unspecified stage: Secondary | ICD-10-CM | POA: Diagnosis not present

## 2016-12-28 DIAGNOSIS — R2689 Other abnormalities of gait and mobility: Secondary | ICD-10-CM | POA: Diagnosis not present

## 2016-12-28 DIAGNOSIS — M6281 Muscle weakness (generalized): Secondary | ICD-10-CM | POA: Diagnosis not present

## 2016-12-28 DIAGNOSIS — R296 Repeated falls: Secondary | ICD-10-CM | POA: Diagnosis not present

## 2016-12-28 DIAGNOSIS — N185 Chronic kidney disease, stage 5: Secondary | ICD-10-CM | POA: Diagnosis not present

## 2016-12-28 DIAGNOSIS — M545 Low back pain: Secondary | ICD-10-CM | POA: Diagnosis not present

## 2016-12-28 DIAGNOSIS — R2681 Unsteadiness on feet: Secondary | ICD-10-CM | POA: Diagnosis not present

## 2016-12-29 DIAGNOSIS — D509 Iron deficiency anemia, unspecified: Secondary | ICD-10-CM | POA: Diagnosis not present

## 2016-12-29 DIAGNOSIS — L89619 Pressure ulcer of right heel, unspecified stage: Secondary | ICD-10-CM | POA: Diagnosis not present

## 2016-12-29 DIAGNOSIS — L97429 Non-pressure chronic ulcer of left heel and midfoot with unspecified severity: Secondary | ICD-10-CM | POA: Diagnosis not present

## 2016-12-29 DIAGNOSIS — D631 Anemia in chronic kidney disease: Secondary | ICD-10-CM | POA: Diagnosis not present

## 2016-12-29 DIAGNOSIS — N186 End stage renal disease: Secondary | ICD-10-CM | POA: Diagnosis not present

## 2016-12-29 DIAGNOSIS — L89629 Pressure ulcer of left heel, unspecified stage: Secondary | ICD-10-CM | POA: Diagnosis not present

## 2016-12-31 DIAGNOSIS — R296 Repeated falls: Secondary | ICD-10-CM | POA: Diagnosis not present

## 2016-12-31 DIAGNOSIS — R2681 Unsteadiness on feet: Secondary | ICD-10-CM | POA: Diagnosis not present

## 2016-12-31 DIAGNOSIS — M545 Low back pain: Secondary | ICD-10-CM | POA: Diagnosis not present

## 2016-12-31 DIAGNOSIS — R2689 Other abnormalities of gait and mobility: Secondary | ICD-10-CM | POA: Diagnosis not present

## 2016-12-31 DIAGNOSIS — M6281 Muscle weakness (generalized): Secondary | ICD-10-CM | POA: Diagnosis not present

## 2016-12-31 DIAGNOSIS — N185 Chronic kidney disease, stage 5: Secondary | ICD-10-CM | POA: Diagnosis not present

## 2017-01-01 DIAGNOSIS — M6281 Muscle weakness (generalized): Secondary | ICD-10-CM | POA: Diagnosis not present

## 2017-01-01 DIAGNOSIS — L97429 Non-pressure chronic ulcer of left heel and midfoot with unspecified severity: Secondary | ICD-10-CM | POA: Diagnosis not present

## 2017-01-01 DIAGNOSIS — N186 End stage renal disease: Secondary | ICD-10-CM | POA: Diagnosis not present

## 2017-01-01 DIAGNOSIS — L89629 Pressure ulcer of left heel, unspecified stage: Secondary | ICD-10-CM | POA: Diagnosis not present

## 2017-01-01 DIAGNOSIS — N185 Chronic kidney disease, stage 5: Secondary | ICD-10-CM | POA: Diagnosis not present

## 2017-01-01 DIAGNOSIS — M545 Low back pain: Secondary | ICD-10-CM | POA: Diagnosis not present

## 2017-01-01 DIAGNOSIS — R2689 Other abnormalities of gait and mobility: Secondary | ICD-10-CM | POA: Diagnosis not present

## 2017-01-01 DIAGNOSIS — R2681 Unsteadiness on feet: Secondary | ICD-10-CM | POA: Diagnosis not present

## 2017-01-01 DIAGNOSIS — L89619 Pressure ulcer of right heel, unspecified stage: Secondary | ICD-10-CM | POA: Diagnosis not present

## 2017-01-01 DIAGNOSIS — R296 Repeated falls: Secondary | ICD-10-CM | POA: Diagnosis not present

## 2017-01-01 DIAGNOSIS — D509 Iron deficiency anemia, unspecified: Secondary | ICD-10-CM | POA: Diagnosis not present

## 2017-01-01 DIAGNOSIS — D631 Anemia in chronic kidney disease: Secondary | ICD-10-CM | POA: Diagnosis not present

## 2017-01-02 DIAGNOSIS — M79672 Pain in left foot: Secondary | ICD-10-CM | POA: Diagnosis not present

## 2017-01-02 DIAGNOSIS — N185 Chronic kidney disease, stage 5: Secondary | ICD-10-CM | POA: Diagnosis not present

## 2017-01-02 DIAGNOSIS — M545 Low back pain: Secondary | ICD-10-CM | POA: Diagnosis not present

## 2017-01-02 DIAGNOSIS — M6281 Muscle weakness (generalized): Secondary | ICD-10-CM | POA: Diagnosis not present

## 2017-01-02 DIAGNOSIS — L8995 Pressure ulcer of unspecified site, unstageable: Secondary | ICD-10-CM | POA: Diagnosis not present

## 2017-01-02 DIAGNOSIS — R2681 Unsteadiness on feet: Secondary | ICD-10-CM | POA: Diagnosis not present

## 2017-01-02 DIAGNOSIS — L97411 Non-pressure chronic ulcer of right heel and midfoot limited to breakdown of skin: Secondary | ICD-10-CM | POA: Diagnosis not present

## 2017-01-02 DIAGNOSIS — L89620 Pressure ulcer of left heel, unstageable: Secondary | ICD-10-CM | POA: Diagnosis not present

## 2017-01-02 DIAGNOSIS — R296 Repeated falls: Secondary | ICD-10-CM | POA: Diagnosis not present

## 2017-01-02 DIAGNOSIS — R2689 Other abnormalities of gait and mobility: Secondary | ICD-10-CM | POA: Diagnosis not present

## 2017-01-03 DIAGNOSIS — M6281 Muscle weakness (generalized): Secondary | ICD-10-CM | POA: Diagnosis not present

## 2017-01-03 DIAGNOSIS — L89619 Pressure ulcer of right heel, unspecified stage: Secondary | ICD-10-CM | POA: Diagnosis not present

## 2017-01-03 DIAGNOSIS — D631 Anemia in chronic kidney disease: Secondary | ICD-10-CM | POA: Diagnosis not present

## 2017-01-03 DIAGNOSIS — R2689 Other abnormalities of gait and mobility: Secondary | ICD-10-CM | POA: Diagnosis not present

## 2017-01-03 DIAGNOSIS — M545 Low back pain: Secondary | ICD-10-CM | POA: Diagnosis not present

## 2017-01-03 DIAGNOSIS — R296 Repeated falls: Secondary | ICD-10-CM | POA: Diagnosis not present

## 2017-01-03 DIAGNOSIS — D509 Iron deficiency anemia, unspecified: Secondary | ICD-10-CM | POA: Diagnosis not present

## 2017-01-03 DIAGNOSIS — L89629 Pressure ulcer of left heel, unspecified stage: Secondary | ICD-10-CM | POA: Diagnosis not present

## 2017-01-03 DIAGNOSIS — R2681 Unsteadiness on feet: Secondary | ICD-10-CM | POA: Diagnosis not present

## 2017-01-03 DIAGNOSIS — N186 End stage renal disease: Secondary | ICD-10-CM | POA: Diagnosis not present

## 2017-01-03 DIAGNOSIS — L97429 Non-pressure chronic ulcer of left heel and midfoot with unspecified severity: Secondary | ICD-10-CM | POA: Diagnosis not present

## 2017-01-03 DIAGNOSIS — N185 Chronic kidney disease, stage 5: Secondary | ICD-10-CM | POA: Diagnosis not present

## 2017-01-04 DIAGNOSIS — E785 Hyperlipidemia, unspecified: Secondary | ICD-10-CM | POA: Diagnosis not present

## 2017-01-04 DIAGNOSIS — N185 Chronic kidney disease, stage 5: Secondary | ICD-10-CM | POA: Diagnosis not present

## 2017-01-04 DIAGNOSIS — R2689 Other abnormalities of gait and mobility: Secondary | ICD-10-CM | POA: Diagnosis not present

## 2017-01-04 DIAGNOSIS — R740 Nonspecific elevation of levels of transaminase and lactic acid dehydrogenase [LDH]: Secondary | ICD-10-CM | POA: Diagnosis not present

## 2017-01-04 DIAGNOSIS — R2681 Unsteadiness on feet: Secondary | ICD-10-CM | POA: Diagnosis not present

## 2017-01-04 DIAGNOSIS — E119 Type 2 diabetes mellitus without complications: Secondary | ICD-10-CM | POA: Diagnosis not present

## 2017-01-04 DIAGNOSIS — I10 Essential (primary) hypertension: Secondary | ICD-10-CM | POA: Diagnosis not present

## 2017-01-04 DIAGNOSIS — E039 Hypothyroidism, unspecified: Secondary | ICD-10-CM | POA: Diagnosis not present

## 2017-01-04 DIAGNOSIS — M545 Low back pain: Secondary | ICD-10-CM | POA: Diagnosis not present

## 2017-01-04 DIAGNOSIS — R296 Repeated falls: Secondary | ICD-10-CM | POA: Diagnosis not present

## 2017-01-04 DIAGNOSIS — M6281 Muscle weakness (generalized): Secondary | ICD-10-CM | POA: Diagnosis not present

## 2017-01-05 DIAGNOSIS — N186 End stage renal disease: Secondary | ICD-10-CM | POA: Diagnosis not present

## 2017-01-05 DIAGNOSIS — L89629 Pressure ulcer of left heel, unspecified stage: Secondary | ICD-10-CM | POA: Diagnosis not present

## 2017-01-05 DIAGNOSIS — D631 Anemia in chronic kidney disease: Secondary | ICD-10-CM | POA: Diagnosis not present

## 2017-01-05 DIAGNOSIS — D509 Iron deficiency anemia, unspecified: Secondary | ICD-10-CM | POA: Diagnosis not present

## 2017-01-05 DIAGNOSIS — L89619 Pressure ulcer of right heel, unspecified stage: Secondary | ICD-10-CM | POA: Diagnosis not present

## 2017-01-05 DIAGNOSIS — L97429 Non-pressure chronic ulcer of left heel and midfoot with unspecified severity: Secondary | ICD-10-CM | POA: Diagnosis not present

## 2017-01-07 DIAGNOSIS — R2681 Unsteadiness on feet: Secondary | ICD-10-CM | POA: Diagnosis not present

## 2017-01-07 DIAGNOSIS — M6281 Muscle weakness (generalized): Secondary | ICD-10-CM | POA: Diagnosis not present

## 2017-01-07 DIAGNOSIS — R296 Repeated falls: Secondary | ICD-10-CM | POA: Diagnosis not present

## 2017-01-07 DIAGNOSIS — R2689 Other abnormalities of gait and mobility: Secondary | ICD-10-CM | POA: Diagnosis not present

## 2017-01-07 DIAGNOSIS — M545 Low back pain: Secondary | ICD-10-CM | POA: Diagnosis not present

## 2017-01-07 DIAGNOSIS — N185 Chronic kidney disease, stage 5: Secondary | ICD-10-CM | POA: Diagnosis not present

## 2017-01-08 DIAGNOSIS — N185 Chronic kidney disease, stage 5: Secondary | ICD-10-CM | POA: Diagnosis not present

## 2017-01-08 DIAGNOSIS — R2681 Unsteadiness on feet: Secondary | ICD-10-CM | POA: Diagnosis not present

## 2017-01-08 DIAGNOSIS — L89629 Pressure ulcer of left heel, unspecified stage: Secondary | ICD-10-CM | POA: Diagnosis not present

## 2017-01-08 DIAGNOSIS — R2689 Other abnormalities of gait and mobility: Secondary | ICD-10-CM | POA: Diagnosis not present

## 2017-01-08 DIAGNOSIS — D509 Iron deficiency anemia, unspecified: Secondary | ICD-10-CM | POA: Diagnosis not present

## 2017-01-08 DIAGNOSIS — M545 Low back pain: Secondary | ICD-10-CM | POA: Diagnosis not present

## 2017-01-08 DIAGNOSIS — L89619 Pressure ulcer of right heel, unspecified stage: Secondary | ICD-10-CM | POA: Diagnosis not present

## 2017-01-08 DIAGNOSIS — Z992 Dependence on renal dialysis: Secondary | ICD-10-CM | POA: Diagnosis not present

## 2017-01-08 DIAGNOSIS — L97429 Non-pressure chronic ulcer of left heel and midfoot with unspecified severity: Secondary | ICD-10-CM | POA: Diagnosis not present

## 2017-01-08 DIAGNOSIS — R296 Repeated falls: Secondary | ICD-10-CM | POA: Diagnosis not present

## 2017-01-08 DIAGNOSIS — D631 Anemia in chronic kidney disease: Secondary | ICD-10-CM | POA: Diagnosis not present

## 2017-01-08 DIAGNOSIS — N186 End stage renal disease: Secondary | ICD-10-CM | POA: Diagnosis not present

## 2017-01-08 DIAGNOSIS — M6281 Muscle weakness (generalized): Secondary | ICD-10-CM | POA: Diagnosis not present

## 2017-01-09 DIAGNOSIS — S91302A Unspecified open wound, left foot, initial encounter: Secondary | ICD-10-CM | POA: Diagnosis not present

## 2017-01-09 DIAGNOSIS — L89620 Pressure ulcer of left heel, unstageable: Secondary | ICD-10-CM | POA: Diagnosis not present

## 2017-01-09 DIAGNOSIS — N185 Chronic kidney disease, stage 5: Secondary | ICD-10-CM | POA: Diagnosis not present

## 2017-01-09 DIAGNOSIS — R2681 Unsteadiness on feet: Secondary | ICD-10-CM | POA: Diagnosis not present

## 2017-01-09 DIAGNOSIS — M6281 Muscle weakness (generalized): Secondary | ICD-10-CM | POA: Diagnosis not present

## 2017-01-09 DIAGNOSIS — M545 Low back pain: Secondary | ICD-10-CM | POA: Diagnosis not present

## 2017-01-09 DIAGNOSIS — R296 Repeated falls: Secondary | ICD-10-CM | POA: Diagnosis not present

## 2017-01-09 DIAGNOSIS — E1140 Type 2 diabetes mellitus with diabetic neuropathy, unspecified: Secondary | ICD-10-CM | POA: Diagnosis not present

## 2017-01-09 DIAGNOSIS — L89612 Pressure ulcer of right heel, stage 2: Secondary | ICD-10-CM | POA: Diagnosis not present

## 2017-01-09 DIAGNOSIS — R2689 Other abnormalities of gait and mobility: Secondary | ICD-10-CM | POA: Diagnosis not present

## 2017-01-10 DIAGNOSIS — R2681 Unsteadiness on feet: Secondary | ICD-10-CM | POA: Diagnosis not present

## 2017-01-10 DIAGNOSIS — M545 Low back pain: Secondary | ICD-10-CM | POA: Diagnosis not present

## 2017-01-10 DIAGNOSIS — R296 Repeated falls: Secondary | ICD-10-CM | POA: Diagnosis not present

## 2017-01-10 DIAGNOSIS — L97429 Non-pressure chronic ulcer of left heel and midfoot with unspecified severity: Secondary | ICD-10-CM | POA: Diagnosis not present

## 2017-01-10 DIAGNOSIS — N186 End stage renal disease: Secondary | ICD-10-CM | POA: Diagnosis not present

## 2017-01-10 DIAGNOSIS — L89619 Pressure ulcer of right heel, unspecified stage: Secondary | ICD-10-CM | POA: Diagnosis not present

## 2017-01-10 DIAGNOSIS — R2689 Other abnormalities of gait and mobility: Secondary | ICD-10-CM | POA: Diagnosis not present

## 2017-01-10 DIAGNOSIS — D509 Iron deficiency anemia, unspecified: Secondary | ICD-10-CM | POA: Diagnosis not present

## 2017-01-10 DIAGNOSIS — L89629 Pressure ulcer of left heel, unspecified stage: Secondary | ICD-10-CM | POA: Diagnosis not present

## 2017-01-10 DIAGNOSIS — N185 Chronic kidney disease, stage 5: Secondary | ICD-10-CM | POA: Diagnosis not present

## 2017-01-10 DIAGNOSIS — M6281 Muscle weakness (generalized): Secondary | ICD-10-CM | POA: Diagnosis not present

## 2017-01-10 DIAGNOSIS — D631 Anemia in chronic kidney disease: Secondary | ICD-10-CM | POA: Diagnosis not present

## 2017-01-10 DIAGNOSIS — Z992 Dependence on renal dialysis: Secondary | ICD-10-CM | POA: Diagnosis not present

## 2017-01-11 DIAGNOSIS — R2689 Other abnormalities of gait and mobility: Secondary | ICD-10-CM | POA: Diagnosis not present

## 2017-01-11 DIAGNOSIS — M6281 Muscle weakness (generalized): Secondary | ICD-10-CM | POA: Diagnosis not present

## 2017-01-11 DIAGNOSIS — N185 Chronic kidney disease, stage 5: Secondary | ICD-10-CM | POA: Diagnosis not present

## 2017-01-11 DIAGNOSIS — R2681 Unsteadiness on feet: Secondary | ICD-10-CM | POA: Diagnosis not present

## 2017-01-12 DIAGNOSIS — L97429 Non-pressure chronic ulcer of left heel and midfoot with unspecified severity: Secondary | ICD-10-CM | POA: Diagnosis not present

## 2017-01-12 DIAGNOSIS — Z992 Dependence on renal dialysis: Secondary | ICD-10-CM | POA: Diagnosis not present

## 2017-01-12 DIAGNOSIS — B9561 Methicillin susceptible Staphylococcus aureus infection as the cause of diseases classified elsewhere: Secondary | ICD-10-CM | POA: Diagnosis not present

## 2017-01-12 DIAGNOSIS — D509 Iron deficiency anemia, unspecified: Secondary | ICD-10-CM | POA: Diagnosis not present

## 2017-01-12 DIAGNOSIS — N186 End stage renal disease: Secondary | ICD-10-CM | POA: Diagnosis not present

## 2017-01-12 DIAGNOSIS — Z5181 Encounter for therapeutic drug level monitoring: Secondary | ICD-10-CM | POA: Diagnosis not present

## 2017-01-12 DIAGNOSIS — N2581 Secondary hyperparathyroidism of renal origin: Secondary | ICD-10-CM | POA: Diagnosis not present

## 2017-01-12 DIAGNOSIS — D631 Anemia in chronic kidney disease: Secondary | ICD-10-CM | POA: Diagnosis not present

## 2017-01-14 DIAGNOSIS — R2689 Other abnormalities of gait and mobility: Secondary | ICD-10-CM | POA: Diagnosis not present

## 2017-01-14 DIAGNOSIS — Z91199 Patient's noncompliance with other medical treatment and regimen due to unspecified reason: Secondary | ICD-10-CM | POA: Insufficient documentation

## 2017-01-14 DIAGNOSIS — L89620 Pressure ulcer of left heel, unstageable: Secondary | ICD-10-CM | POA: Diagnosis not present

## 2017-01-14 DIAGNOSIS — D631 Anemia in chronic kidney disease: Secondary | ICD-10-CM | POA: Insufficient documentation

## 2017-01-14 DIAGNOSIS — E119 Type 2 diabetes mellitus without complications: Secondary | ICD-10-CM | POA: Insufficient documentation

## 2017-01-14 DIAGNOSIS — N185 Chronic kidney disease, stage 5: Secondary | ICD-10-CM | POA: Diagnosis not present

## 2017-01-14 DIAGNOSIS — Z9119 Patient's noncompliance with other medical treatment and regimen: Secondary | ICD-10-CM | POA: Insufficient documentation

## 2017-01-14 DIAGNOSIS — N189 Chronic kidney disease, unspecified: Secondary | ICD-10-CM | POA: Insufficient documentation

## 2017-01-14 DIAGNOSIS — M6281 Muscle weakness (generalized): Secondary | ICD-10-CM | POA: Diagnosis not present

## 2017-01-14 DIAGNOSIS — R2681 Unsteadiness on feet: Secondary | ICD-10-CM | POA: Diagnosis not present

## 2017-01-14 DIAGNOSIS — Z992 Dependence on renal dialysis: Secondary | ICD-10-CM | POA: Insufficient documentation

## 2017-01-14 DIAGNOSIS — L89612 Pressure ulcer of right heel, stage 2: Secondary | ICD-10-CM | POA: Diagnosis not present

## 2017-01-14 DIAGNOSIS — E1140 Type 2 diabetes mellitus with diabetic neuropathy, unspecified: Secondary | ICD-10-CM | POA: Insufficient documentation

## 2017-01-14 DIAGNOSIS — E118 Type 2 diabetes mellitus with unspecified complications: Secondary | ICD-10-CM | POA: Insufficient documentation

## 2017-01-15 DIAGNOSIS — D631 Anemia in chronic kidney disease: Secondary | ICD-10-CM | POA: Diagnosis not present

## 2017-01-15 DIAGNOSIS — N2581 Secondary hyperparathyroidism of renal origin: Secondary | ICD-10-CM | POA: Diagnosis not present

## 2017-01-15 DIAGNOSIS — L97429 Non-pressure chronic ulcer of left heel and midfoot with unspecified severity: Secondary | ICD-10-CM | POA: Diagnosis not present

## 2017-01-15 DIAGNOSIS — N186 End stage renal disease: Secondary | ICD-10-CM | POA: Diagnosis not present

## 2017-01-15 DIAGNOSIS — Z5181 Encounter for therapeutic drug level monitoring: Secondary | ICD-10-CM | POA: Diagnosis not present

## 2017-01-15 DIAGNOSIS — D509 Iron deficiency anemia, unspecified: Secondary | ICD-10-CM | POA: Diagnosis not present

## 2017-01-16 DIAGNOSIS — L97429 Non-pressure chronic ulcer of left heel and midfoot with unspecified severity: Secondary | ICD-10-CM | POA: Diagnosis not present

## 2017-01-16 DIAGNOSIS — N186 End stage renal disease: Secondary | ICD-10-CM | POA: Diagnosis not present

## 2017-01-16 DIAGNOSIS — Z5181 Encounter for therapeutic drug level monitoring: Secondary | ICD-10-CM | POA: Diagnosis not present

## 2017-01-16 DIAGNOSIS — D509 Iron deficiency anemia, unspecified: Secondary | ICD-10-CM | POA: Diagnosis not present

## 2017-01-16 DIAGNOSIS — D631 Anemia in chronic kidney disease: Secondary | ICD-10-CM | POA: Diagnosis not present

## 2017-01-16 DIAGNOSIS — N2581 Secondary hyperparathyroidism of renal origin: Secondary | ICD-10-CM | POA: Diagnosis not present

## 2017-01-17 DIAGNOSIS — N186 End stage renal disease: Secondary | ICD-10-CM | POA: Diagnosis not present

## 2017-01-17 DIAGNOSIS — D631 Anemia in chronic kidney disease: Secondary | ICD-10-CM | POA: Diagnosis not present

## 2017-01-17 DIAGNOSIS — D509 Iron deficiency anemia, unspecified: Secondary | ICD-10-CM | POA: Diagnosis not present

## 2017-01-17 DIAGNOSIS — L97429 Non-pressure chronic ulcer of left heel and midfoot with unspecified severity: Secondary | ICD-10-CM | POA: Diagnosis not present

## 2017-01-17 DIAGNOSIS — Z5181 Encounter for therapeutic drug level monitoring: Secondary | ICD-10-CM | POA: Diagnosis not present

## 2017-01-17 DIAGNOSIS — N2581 Secondary hyperparathyroidism of renal origin: Secondary | ICD-10-CM | POA: Diagnosis not present

## 2017-01-19 DIAGNOSIS — N186 End stage renal disease: Secondary | ICD-10-CM | POA: Diagnosis not present

## 2017-01-19 DIAGNOSIS — N2581 Secondary hyperparathyroidism of renal origin: Secondary | ICD-10-CM | POA: Diagnosis not present

## 2017-01-19 DIAGNOSIS — Z5181 Encounter for therapeutic drug level monitoring: Secondary | ICD-10-CM | POA: Diagnosis not present

## 2017-01-19 DIAGNOSIS — L97429 Non-pressure chronic ulcer of left heel and midfoot with unspecified severity: Secondary | ICD-10-CM | POA: Diagnosis not present

## 2017-01-19 DIAGNOSIS — D631 Anemia in chronic kidney disease: Secondary | ICD-10-CM | POA: Diagnosis not present

## 2017-01-19 DIAGNOSIS — D509 Iron deficiency anemia, unspecified: Secondary | ICD-10-CM | POA: Diagnosis not present

## 2017-01-21 DIAGNOSIS — S91302A Unspecified open wound, left foot, initial encounter: Secondary | ICD-10-CM | POA: Diagnosis not present

## 2017-01-21 DIAGNOSIS — S91301A Unspecified open wound, right foot, initial encounter: Secondary | ICD-10-CM | POA: Diagnosis not present

## 2017-01-22 DIAGNOSIS — N185 Chronic kidney disease, stage 5: Secondary | ICD-10-CM | POA: Diagnosis not present

## 2017-01-22 DIAGNOSIS — Z992 Dependence on renal dialysis: Secondary | ICD-10-CM | POA: Diagnosis not present

## 2017-01-22 DIAGNOSIS — Z5181 Encounter for therapeutic drug level monitoring: Secondary | ICD-10-CM | POA: Diagnosis not present

## 2017-01-22 DIAGNOSIS — F339 Major depressive disorder, recurrent, unspecified: Secondary | ICD-10-CM | POA: Diagnosis not present

## 2017-01-22 DIAGNOSIS — D509 Iron deficiency anemia, unspecified: Secondary | ICD-10-CM | POA: Diagnosis not present

## 2017-01-22 DIAGNOSIS — D631 Anemia in chronic kidney disease: Secondary | ICD-10-CM | POA: Diagnosis not present

## 2017-01-22 DIAGNOSIS — L97429 Non-pressure chronic ulcer of left heel and midfoot with unspecified severity: Secondary | ICD-10-CM | POA: Diagnosis not present

## 2017-01-22 DIAGNOSIS — N186 End stage renal disease: Secondary | ICD-10-CM | POA: Diagnosis not present

## 2017-01-22 DIAGNOSIS — F411 Generalized anxiety disorder: Secondary | ICD-10-CM | POA: Diagnosis not present

## 2017-01-22 DIAGNOSIS — N2581 Secondary hyperparathyroidism of renal origin: Secondary | ICD-10-CM | POA: Diagnosis not present

## 2017-01-23 DIAGNOSIS — M47816 Spondylosis without myelopathy or radiculopathy, lumbar region: Secondary | ICD-10-CM | POA: Diagnosis not present

## 2017-01-23 DIAGNOSIS — L89620 Pressure ulcer of left heel, unstageable: Secondary | ICD-10-CM | POA: Diagnosis not present

## 2017-01-23 DIAGNOSIS — E1140 Type 2 diabetes mellitus with diabetic neuropathy, unspecified: Secondary | ICD-10-CM | POA: Diagnosis not present

## 2017-01-24 DIAGNOSIS — Z5181 Encounter for therapeutic drug level monitoring: Secondary | ICD-10-CM | POA: Diagnosis not present

## 2017-01-24 DIAGNOSIS — N186 End stage renal disease: Secondary | ICD-10-CM | POA: Diagnosis not present

## 2017-01-24 DIAGNOSIS — L97429 Non-pressure chronic ulcer of left heel and midfoot with unspecified severity: Secondary | ICD-10-CM | POA: Diagnosis not present

## 2017-01-24 DIAGNOSIS — D631 Anemia in chronic kidney disease: Secondary | ICD-10-CM | POA: Diagnosis not present

## 2017-01-24 DIAGNOSIS — D509 Iron deficiency anemia, unspecified: Secondary | ICD-10-CM | POA: Diagnosis not present

## 2017-01-24 DIAGNOSIS — N2581 Secondary hyperparathyroidism of renal origin: Secondary | ICD-10-CM | POA: Diagnosis not present

## 2017-01-26 DIAGNOSIS — L97429 Non-pressure chronic ulcer of left heel and midfoot with unspecified severity: Secondary | ICD-10-CM | POA: Diagnosis not present

## 2017-01-26 DIAGNOSIS — N186 End stage renal disease: Secondary | ICD-10-CM | POA: Diagnosis not present

## 2017-01-26 DIAGNOSIS — D509 Iron deficiency anemia, unspecified: Secondary | ICD-10-CM | POA: Diagnosis not present

## 2017-01-26 DIAGNOSIS — Z5181 Encounter for therapeutic drug level monitoring: Secondary | ICD-10-CM | POA: Diagnosis not present

## 2017-01-26 DIAGNOSIS — N2581 Secondary hyperparathyroidism of renal origin: Secondary | ICD-10-CM | POA: Diagnosis not present

## 2017-01-26 DIAGNOSIS — D631 Anemia in chronic kidney disease: Secondary | ICD-10-CM | POA: Diagnosis not present

## 2017-01-28 DIAGNOSIS — L89620 Pressure ulcer of left heel, unstageable: Secondary | ICD-10-CM | POA: Diagnosis not present

## 2017-01-28 DIAGNOSIS — L97411 Non-pressure chronic ulcer of right heel and midfoot limited to breakdown of skin: Secondary | ICD-10-CM | POA: Diagnosis not present

## 2017-01-29 DIAGNOSIS — N2581 Secondary hyperparathyroidism of renal origin: Secondary | ICD-10-CM | POA: Diagnosis not present

## 2017-01-29 DIAGNOSIS — N186 End stage renal disease: Secondary | ICD-10-CM | POA: Diagnosis not present

## 2017-01-29 DIAGNOSIS — L97429 Non-pressure chronic ulcer of left heel and midfoot with unspecified severity: Secondary | ICD-10-CM | POA: Diagnosis not present

## 2017-01-29 DIAGNOSIS — Z5181 Encounter for therapeutic drug level monitoring: Secondary | ICD-10-CM | POA: Diagnosis not present

## 2017-01-29 DIAGNOSIS — D631 Anemia in chronic kidney disease: Secondary | ICD-10-CM | POA: Diagnosis not present

## 2017-01-29 DIAGNOSIS — D509 Iron deficiency anemia, unspecified: Secondary | ICD-10-CM | POA: Diagnosis not present

## 2017-01-30 DIAGNOSIS — I739 Peripheral vascular disease, unspecified: Secondary | ICD-10-CM | POA: Diagnosis not present

## 2017-01-31 DIAGNOSIS — N2581 Secondary hyperparathyroidism of renal origin: Secondary | ICD-10-CM | POA: Diagnosis not present

## 2017-01-31 DIAGNOSIS — D631 Anemia in chronic kidney disease: Secondary | ICD-10-CM | POA: Diagnosis not present

## 2017-01-31 DIAGNOSIS — L97429 Non-pressure chronic ulcer of left heel and midfoot with unspecified severity: Secondary | ICD-10-CM | POA: Diagnosis not present

## 2017-01-31 DIAGNOSIS — N186 End stage renal disease: Secondary | ICD-10-CM | POA: Diagnosis not present

## 2017-01-31 DIAGNOSIS — Z5181 Encounter for therapeutic drug level monitoring: Secondary | ICD-10-CM | POA: Diagnosis not present

## 2017-01-31 DIAGNOSIS — D509 Iron deficiency anemia, unspecified: Secondary | ICD-10-CM | POA: Diagnosis not present

## 2017-02-02 DIAGNOSIS — Z5181 Encounter for therapeutic drug level monitoring: Secondary | ICD-10-CM | POA: Diagnosis not present

## 2017-02-02 DIAGNOSIS — D509 Iron deficiency anemia, unspecified: Secondary | ICD-10-CM | POA: Diagnosis not present

## 2017-02-02 DIAGNOSIS — N2581 Secondary hyperparathyroidism of renal origin: Secondary | ICD-10-CM | POA: Diagnosis not present

## 2017-02-02 DIAGNOSIS — N186 End stage renal disease: Secondary | ICD-10-CM | POA: Diagnosis not present

## 2017-02-02 DIAGNOSIS — D631 Anemia in chronic kidney disease: Secondary | ICD-10-CM | POA: Diagnosis not present

## 2017-02-02 DIAGNOSIS — L97429 Non-pressure chronic ulcer of left heel and midfoot with unspecified severity: Secondary | ICD-10-CM | POA: Diagnosis not present

## 2017-02-04 DIAGNOSIS — I739 Peripheral vascular disease, unspecified: Secondary | ICD-10-CM | POA: Diagnosis not present

## 2017-02-04 DIAGNOSIS — S91302A Unspecified open wound, left foot, initial encounter: Secondary | ICD-10-CM | POA: Diagnosis not present

## 2017-02-04 DIAGNOSIS — M19072 Primary osteoarthritis, left ankle and foot: Secondary | ICD-10-CM | POA: Diagnosis not present

## 2017-02-04 DIAGNOSIS — I890 Lymphedema, not elsewhere classified: Secondary | ICD-10-CM | POA: Diagnosis not present

## 2017-02-04 DIAGNOSIS — L89620 Pressure ulcer of left heel, unstageable: Secondary | ICD-10-CM | POA: Diagnosis not present

## 2017-02-05 DIAGNOSIS — N186 End stage renal disease: Secondary | ICD-10-CM | POA: Diagnosis not present

## 2017-02-05 DIAGNOSIS — Z5181 Encounter for therapeutic drug level monitoring: Secondary | ICD-10-CM | POA: Diagnosis not present

## 2017-02-05 DIAGNOSIS — D631 Anemia in chronic kidney disease: Secondary | ICD-10-CM | POA: Diagnosis not present

## 2017-02-05 DIAGNOSIS — E119 Type 2 diabetes mellitus without complications: Secondary | ICD-10-CM | POA: Diagnosis not present

## 2017-02-05 DIAGNOSIS — Z794 Long term (current) use of insulin: Secondary | ICD-10-CM | POA: Diagnosis not present

## 2017-02-05 DIAGNOSIS — N2581 Secondary hyperparathyroidism of renal origin: Secondary | ICD-10-CM | POA: Diagnosis not present

## 2017-02-05 DIAGNOSIS — L97429 Non-pressure chronic ulcer of left heel and midfoot with unspecified severity: Secondary | ICD-10-CM | POA: Diagnosis not present

## 2017-02-05 DIAGNOSIS — Z992 Dependence on renal dialysis: Secondary | ICD-10-CM | POA: Diagnosis not present

## 2017-02-05 DIAGNOSIS — D509 Iron deficiency anemia, unspecified: Secondary | ICD-10-CM | POA: Diagnosis not present

## 2017-02-07 ENCOUNTER — Telehealth: Payer: Self-pay | Admitting: Cardiovascular Disease

## 2017-02-07 DIAGNOSIS — D631 Anemia in chronic kidney disease: Secondary | ICD-10-CM | POA: Diagnosis not present

## 2017-02-07 DIAGNOSIS — N2581 Secondary hyperparathyroidism of renal origin: Secondary | ICD-10-CM | POA: Diagnosis not present

## 2017-02-07 DIAGNOSIS — L97429 Non-pressure chronic ulcer of left heel and midfoot with unspecified severity: Secondary | ICD-10-CM | POA: Diagnosis not present

## 2017-02-07 DIAGNOSIS — D509 Iron deficiency anemia, unspecified: Secondary | ICD-10-CM | POA: Diagnosis not present

## 2017-02-07 DIAGNOSIS — Z5181 Encounter for therapeutic drug level monitoring: Secondary | ICD-10-CM | POA: Diagnosis not present

## 2017-02-07 DIAGNOSIS — N186 End stage renal disease: Secondary | ICD-10-CM | POA: Diagnosis not present

## 2017-02-07 NOTE — Telephone Encounter (Signed)
Received records from [facility] for appointment on 02/08/17 with Dr Gwenlyn Found.  Records put with Dr Kennon Holter schedule for 02/08/17. lp

## 2017-02-08 ENCOUNTER — Encounter: Payer: Self-pay | Admitting: Cardiovascular Disease

## 2017-02-08 ENCOUNTER — Ambulatory Visit (INDEPENDENT_AMBULATORY_CARE_PROVIDER_SITE_OTHER): Payer: Medicare Other | Admitting: Cardiovascular Disease

## 2017-02-08 ENCOUNTER — Ambulatory Visit
Admission: RE | Admit: 2017-02-08 | Discharge: 2017-02-08 | Disposition: A | Discharge status: Home / Self Care | Payer: Medicare Other | Source: Ambulatory Visit | Attending: Cardiovascular Disease | Admitting: Cardiovascular Disease

## 2017-02-08 ENCOUNTER — Ambulatory Visit (HOSPITAL_COMMUNITY)
Admission: RE | Admit: 2017-02-08 | Discharge: 2017-02-08 | Disposition: A | Discharge status: Home / Self Care | Payer: Medicare Other | Source: Ambulatory Visit | Attending: Cardiology | Admitting: Cardiology

## 2017-02-08 ENCOUNTER — Other Ambulatory Visit: Payer: Self-pay | Admitting: Cardiovascular Disease

## 2017-02-08 VITALS — BP 153/83 | HR 88 | Ht 76.0 in | Wt 230.8 lb

## 2017-02-08 DIAGNOSIS — I70229 Atherosclerosis of native arteries of extremities with rest pain, unspecified extremity: Secondary | ICD-10-CM

## 2017-02-08 DIAGNOSIS — I998 Other disorder of circulatory system: Secondary | ICD-10-CM

## 2017-02-08 DIAGNOSIS — I517 Cardiomegaly: Secondary | ICD-10-CM | POA: Diagnosis not present

## 2017-02-08 DIAGNOSIS — I739 Peripheral vascular disease, unspecified: Secondary | ICD-10-CM | POA: Insufficient documentation

## 2017-02-08 NOTE — Progress Notes (Signed)
02/08/2017 [name]   1950/08/20  [id number]  Primary Physician Caryl Bis, MD Primary Cardiologist: Lorretta Harp MD Renae Gloss  HPI:  Mr. Guo is referred by The rehabilitation center in [facility] for evaluation of peripheral arterial disease. He has a history of diabetes and hyperlipidemia. He stopped smoking 5 years ago. He has never had a heart attack or stroke. 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 He is currently in a rehabilitation facility. He just completed 5 weeks of intravenous antibiotics. He has a significant left heel ulcer with a right heel ulcer that has since healed. He did have Dopplers performed in the rehabilitation Center that showed significant peripheral vascular disease.   Current Outpatient Prescriptions  Medication Sig Dispense Refill  . atorvastatin (LIPITOR) 80 MG tablet Take 80 mg by mouth at bedtime.  5  . calcium acetate (PHOSLO) 667 MG capsule Take 1 capsule by mouth 3 (three) times daily.  10  . DULoxetine (CYMBALTA) 60 MG capsule Take 60 mg by mouth at bedtime. Reported on 03/01/2016  3  . FLUoxetine (PROZAC) 40 MG capsule Take 40 mg by mouth at bedtime.  6  . gabapentin (NEURONTIN) 300 MG capsule Take 600 mg by mouth 3 (three) times daily.   3  . levothyroxine (SYNTHROID, LEVOTHROID) 150 MCG tablet Take 150 mcg by mouth daily before breakfast.    . loratadine (CLARITIN) 10 MG tablet Take 10 mg by mouth daily.    Marland Kitchen LYRICA 50 MG capsule Take 1 tablet by mouth 3 (three) times daily.  4  . mirtazapine (REMERON) 15 MG tablet Take 7.5 mg by mouth at bedtime.  10  . nortriptyline (PAMELOR) 25 MG capsule Take 1 capsule by mouth at bedtime.  10  . Probiotic Product (PHILLIPS COLON HEALTH PO) Take 1 capsule by  mouth daily as needed (constipation).    . ranitidine (ZANTAC) 150 MG tablet Take 150 mg by mouth 2 (two) times daily.     . risperiDONE (RISPERDAL) 0.5 MG tablet Take 1 tablet by mouth daily.  10  . TOUJEO SOLOSTAR 300 UNIT/ML SOPN Inject 36 Units as directed at bedtime.  10  . traZODone (DESYREL) 50 MG tablet Take 1 tablet by mouth daily.  10   No current facility-administered medications for this visit.     Allergies  Allergen Reactions  . Morphine And Related Other (See Comments)    Not effective  . Zaroxolyn [Metolazone]     Social History   Social History  . Marital status: Married    Spouse name: N/A  . Number of children: N/A  . Years of education: N/A   Occupational History  . Not on file.   Social History Main Topics  . Smoking status: Former Smoker    Years: 10.00    Types: Cigars    Quit date: 03/13/2012  . Smokeless tobacco: Never Used  . Alcohol use No  . Drug use: No  . Sexual activity: No   Other Topics Concern  . Not on file   Social History Narrative  . No narrative on file     Review of Systems: General: negative for chills, fever, night sweats or weight changes.  Cardiovascular: negative for chest pain, dyspnea on exertion,  edema, orthopnea, palpitations, paroxysmal nocturnal dyspnea or shortness of breath Dermatological: negative for rash Respiratory: negative for cough or wheezing Urologic: negative for hematuria Abdominal: negative for nausea, vomiting, diarrhea, bright red blood per rectum, melena, or hematemesis Neurologic: negative for visual changes, syncope, or dizziness All other systems reviewed and are otherwise negative except as noted above.    Blood pressure (!) 153/83, pulse 88, height 6\' 4"  (1.93 m), weight 230 lb 12.8 oz (104.7 kg).  General appearance: no distress Neck: no adenopathy, no JVD, supple, symmetrical, trachea midline, thyroid not enlarged, symmetric, no tenderness/mass/nodules and left carotid bruit most likely  from his AV fistula Lungs: clear to auscultation bilaterally Heart: regular rate and rhythm, S1, S2 normal, no murmur, click, rub or gallop Extremities: left heel ulcer  EKG Sinus rhythm at 88 with right bundle branch block and left axis deviation. I personally reviewed this EKG.  ASSESSMENT AND PLAN:   PVD (peripheral vascular disease) [facility]) Mr. Counsell was referred for evaluation of a left heel ulcer consistent with critical limb ischemia. He has a history of remote tobacco abuse, treated hyperlipidemia and chronic renal insufficiency on hemodialysis for the last 5 years. He does have diabetic peripheral neuropathy. Never had a heart attack or stroke. He denies chest pain or shortness of breath. He's had a left heel ulcer for several months but he is really unaware because of his lack of feeling. He is in a rehabilitation facility currently and just completed 5 weeks of intravenous antibiotics. Recent Dopplers were performed in facility suggest significant peripheral vascular disease.      Lorretta Harp MD FACP,FACC,FAHA, [facility] 02/08/2017 11:41 AM

## 2017-02-08 NOTE — Addendum Note (Signed)
Addended by: Solomon Skowronek L on: 02/08/2017 04:25 PM   Modules accepted: Orders  

## 2017-02-08 NOTE — Patient Instructions (Addendum)
Medication Instructions: Your physician recommends that you continue on your current medications as directed. Please refer to the Current Medication list given to you today.   Testing/Procedures: Your physician has requested that you have a lower extremity arterial duplex. During this test, ultrasound is used to evaluate arterial blood flow in the legs. Allow one hour for this exam. There are no restrictions or special instructions.  Your physician has requested that you have an ankle brachial index (ABI). During this test an ultrasound and blood pressure cuff are used to evaluate the arteries that supply the arms and legs with blood. Allow thirty minutes for this exam. There are no restrictions or special instructions.  TODAY   Follow-Up: You have been referred to Wound Care at [facility] or Dr. Dellia Nims.  Your physician recommends that you schedule a follow-up appointment with Dr. Gwenlyn Found after you Wound Care appointment.     [place] [address] [place] [address] Dept: [phone] Loc: [place]  02/08/2017  You are scheduled for a Peripheral Angiogram on Monday, July 2 with Dr. Quay Burow.  1. Please arrive at the [facility] (Main Entrance A) at [facility]: [address] [place], [place] [address] at 9:30 AM (two hours before your procedure to ensure your preparation). Free valet parking service is available.   Special note: Every effort is made to have your procedure done on time. Please understand that emergencies sometimes delay scheduled procedures.  2. Diet: Do not eat or drink anything after midnight prior to your procedure except sips of water to take medications.  3. Labs: You will need to have blood drawn on Friday, June 29 in our office. You do not need to be fasting.  4. Medication instructions in preparation for your  procedure:  Take only 18 units of insulin the night before your procedure. Do not take any insulin on the day of the procedure.  On the morning of your procedure, take your Plavix/Clopidogrel and any morning medicines NOT listed above.  You may use sips of water.  5. Plan for one night stay--bring personal belongings. 6. Bring a current list of your medications and current insurance cards. 7. You MUST have a responsible person to drive you home. 8. Someone MUST be with you the first 24 hours after you arrive home or your discharge will be delayed. 9. Please wear clothes that are easy to get on and off and wear slip-on shoes.  Thank you for allowing [place] to care for you!   -- [facility]

## 2017-02-08 NOTE — Assessment & Plan Note (Signed)
[name] was referred for evaluation of a left heel ulcer consistent with critical limb ischemia. He has a history of remote tobacco abuse, treated hyperlipidemia and chronic renal insufficiency on hemodialysis for the last 5 years. He does have diabetic peripheral neuropathy. Never had a heart attack or stroke. He denies chest pain or shortness of breath. He's had a left heel ulcer for several months but he is really unaware because of his lack of feeling. He is in a rehabilitation facility currently and just completed 5 weeks of intravenous antibiotics. Recent Dopplers were performed in facility suggest significant peripheral vascular disease.

## 2017-02-09 DIAGNOSIS — N186 End stage renal disease: Secondary | ICD-10-CM | POA: Diagnosis not present

## 2017-02-09 DIAGNOSIS — Z5181 Encounter for therapeutic drug level monitoring: Secondary | ICD-10-CM | POA: Diagnosis not present

## 2017-02-09 DIAGNOSIS — Z992 Dependence on renal dialysis: Secondary | ICD-10-CM | POA: Diagnosis not present

## 2017-02-09 DIAGNOSIS — D509 Iron deficiency anemia, unspecified: Secondary | ICD-10-CM | POA: Diagnosis not present

## 2017-02-09 DIAGNOSIS — N2581 Secondary hyperparathyroidism of renal origin: Secondary | ICD-10-CM | POA: Diagnosis not present

## 2017-02-09 DIAGNOSIS — L97429 Non-pressure chronic ulcer of left heel and midfoot with unspecified severity: Secondary | ICD-10-CM | POA: Diagnosis not present

## 2017-02-09 DIAGNOSIS — D631 Anemia in chronic kidney disease: Secondary | ICD-10-CM | POA: Diagnosis not present

## 2017-02-09 LAB — CBC WITH DIFFERENTIAL/PLATELET
BASOS ABS: 0 10*3/uL (ref 0.0–0.2)
Basos: 1 %
EOS (ABSOLUTE): 0.2 10*3/uL (ref 0.0–0.4)
Eos: 3 %
HEMOGLOBIN: 11.5 g/dL — AB (ref 13.0–17.7)
Hematocrit: 34.3 % — ABNORMAL LOW (ref 37.5–51.0)
Immature Grans (Abs): 0 10*3/uL (ref 0.0–0.1)
Immature Granulocytes: 0 %
Lymphocytes Absolute: 2.1 10*3/uL (ref 0.7–3.1)
Lymphs: 32 %
MCH: 31.6 pg (ref 26.6–33.0)
MCHC: 33.5 g/dL (ref 31.5–35.7)
MCV: 94 fL (ref 79–97)
MONOCYTES: 8 %
Monocytes Absolute: 0.6 10*3/uL (ref 0.1–0.9)
Neutrophils Absolute: 3.7 10*3/uL (ref 1.4–7.0)
Neutrophils: 56 %
Platelets: 208 10*3/uL (ref 150–379)
RBC: 3.64 x10E6/uL — ABNORMAL LOW (ref 4.14–5.80)
RDW: 16.7 % — ABNORMAL HIGH (ref 12.3–15.4)
WBC: 6.6 10*3/uL (ref 3.4–10.8)

## 2017-02-09 LAB — TSH: TSH: 4.23 u[IU]/mL (ref 0.450–4.500)

## 2017-02-09 LAB — BASIC METABOLIC PANEL
BUN/Creatinine Ratio: 10 (ref 10–24)
BUN: 67 mg/dL — ABNORMAL HIGH (ref 8–27)
CALCIUM: 9.3 mg/dL (ref 8.6–10.2)
CHLORIDE: 85 mmol/L — AB (ref 96–106)
CO2: 29 mmol/L (ref 20–29)
Creatinine, Ser: 6.72 mg/dL — ABNORMAL HIGH (ref 0.76–1.27)
GFR calc Af Amer: 9 mL/min/{1.73_m2} — ABNORMAL LOW (ref >59)
GFR, EST NON AFRICAN AMERICAN: 8 mL/min/{1.73_m2} — AB (ref >59)
GLUCOSE: 198 mg/dL — AB (ref 65–99)
Potassium: 4.1 mmol/L (ref 3.5–5.2)
Sodium: 134 mmol/L (ref 134–144)

## 2017-02-09 LAB — PROTIME-INR
INR: 1.1 (ref 0.8–1.2)
PROTHROMBIN TIME: 11.8 s (ref 9.1–12.0)

## 2017-02-09 LAB — APTT: aPTT: 28 s (ref 24–33)

## 2017-02-11 ENCOUNTER — Ambulatory Visit (HOSPITAL_COMMUNITY): Payer: Medicare Other | Admitting: Anesthesiology

## 2017-02-11 ENCOUNTER — Encounter (HOSPITAL_COMMUNITY): Payer: Self-pay | Admitting: General Practice

## 2017-02-11 ENCOUNTER — Inpatient Hospital Stay (HOSPITAL_COMMUNITY)
Admission: RE | Admit: 2017-02-11 | Discharge: 2017-02-17 | DRG: 907 | Disposition: A | Discharge status: Skilled Nursing Facility | Payer: Medicare Other | Source: Ambulatory Visit | Attending: Cardiovascular Disease | Admitting: Cardiovascular Disease

## 2017-02-11 ENCOUNTER — Ambulatory Visit (HOSPITAL_COMMUNITY): Payer: Medicare Other

## 2017-02-11 ENCOUNTER — Encounter (HOSPITAL_COMMUNITY)
Admission: RE | Disposition: A | Discharge status: Skilled Nursing Facility | Payer: Self-pay | Source: Ambulatory Visit | Attending: Cardiovascular Disease

## 2017-02-11 DIAGNOSIS — N186 End stage renal disease: Secondary | ICD-10-CM | POA: Diagnosis not present

## 2017-02-11 DIAGNOSIS — E1122 Type 2 diabetes mellitus with diabetic chronic kidney disease: Secondary | ICD-10-CM | POA: Diagnosis present

## 2017-02-11 DIAGNOSIS — I1311 Hypertensive heart and chronic kidney disease without heart failure, with stage 5 chronic kidney disease, or end stage renal disease: Secondary | ICD-10-CM | POA: Diagnosis present

## 2017-02-11 DIAGNOSIS — E11621 Type 2 diabetes mellitus with foot ulcer: Secondary | ICD-10-CM | POA: Diagnosis not present

## 2017-02-11 DIAGNOSIS — I70229 Atherosclerosis of native arteries of extremities with rest pain, unspecified extremity: Secondary | ICD-10-CM

## 2017-02-11 DIAGNOSIS — I70248 Atherosclerosis of native arteries of left leg with ulceration of other part of lower left leg: Secondary | ICD-10-CM | POA: Diagnosis not present

## 2017-02-11 DIAGNOSIS — K219 Gastro-esophageal reflux disease without esophagitis: Secondary | ICD-10-CM | POA: Diagnosis present

## 2017-02-11 DIAGNOSIS — E1151 Type 2 diabetes mellitus with diabetic peripheral angiopathy without gangrene: Secondary | ICD-10-CM | POA: Diagnosis present

## 2017-02-11 DIAGNOSIS — Z992 Dependence on renal dialysis: Secondary | ICD-10-CM

## 2017-02-11 DIAGNOSIS — Z452 Encounter for adjustment and management of vascular access device: Secondary | ICD-10-CM

## 2017-02-11 DIAGNOSIS — I724 Aneurysm of artery of lower extremity: Secondary | ICD-10-CM | POA: Diagnosis not present

## 2017-02-11 DIAGNOSIS — E8889 Other specified metabolic disorders: Secondary | ICD-10-CM | POA: Diagnosis present

## 2017-02-11 DIAGNOSIS — E1142 Type 2 diabetes mellitus with diabetic polyneuropathy: Secondary | ICD-10-CM | POA: Diagnosis not present

## 2017-02-11 DIAGNOSIS — Z79899 Other long term (current) drug therapy: Secondary | ICD-10-CM | POA: Diagnosis not present

## 2017-02-11 DIAGNOSIS — J449 Chronic obstructive pulmonary disease, unspecified: Secondary | ICD-10-CM | POA: Diagnosis present

## 2017-02-11 DIAGNOSIS — L97419 Non-pressure chronic ulcer of right heel and midfoot with unspecified severity: Secondary | ICD-10-CM | POA: Diagnosis not present

## 2017-02-11 DIAGNOSIS — I97618 Postprocedural hemorrhage and hematoma of a circulatory system organ or structure following other circulatory system procedure: Principal | ICD-10-CM | POA: Diagnosis present

## 2017-02-11 DIAGNOSIS — I9789 Other postprocedural complications and disorders of the circulatory system, not elsewhere classified: Secondary | ICD-10-CM | POA: Diagnosis not present

## 2017-02-11 DIAGNOSIS — I739 Peripheral vascular disease, unspecified: Secondary | ICD-10-CM | POA: Diagnosis present

## 2017-02-11 DIAGNOSIS — D631 Anemia in chronic kidney disease: Secondary | ICD-10-CM | POA: Diagnosis present

## 2017-02-11 DIAGNOSIS — S301XXA Contusion of abdominal wall, initial encounter: Secondary | ICD-10-CM | POA: Diagnosis present

## 2017-02-11 DIAGNOSIS — I998 Other disorder of circulatory system: Secondary | ICD-10-CM | POA: Diagnosis present

## 2017-02-11 DIAGNOSIS — D62 Acute posthemorrhagic anemia: Secondary | ICD-10-CM | POA: Diagnosis present

## 2017-02-11 DIAGNOSIS — L97429 Non-pressure chronic ulcer of left heel and midfoot with unspecified severity: Secondary | ICD-10-CM | POA: Diagnosis present

## 2017-02-11 DIAGNOSIS — Z87891 Personal history of nicotine dependence: Secondary | ICD-10-CM

## 2017-02-11 DIAGNOSIS — G4700 Insomnia, unspecified: Secondary | ICD-10-CM | POA: Diagnosis present

## 2017-02-11 DIAGNOSIS — Z8249 Family history of ischemic heart disease and other diseases of the circulatory system: Secondary | ICD-10-CM

## 2017-02-11 DIAGNOSIS — Z888 Allergy status to other drugs, medicaments and biological substances status: Secondary | ICD-10-CM

## 2017-02-11 DIAGNOSIS — L7632 Postprocedural hematoma of skin and subcutaneous tissue following other procedure: Secondary | ICD-10-CM | POA: Diagnosis present

## 2017-02-11 DIAGNOSIS — I70203 Unspecified atherosclerosis of native arteries of extremities, bilateral legs: Secondary | ICD-10-CM | POA: Diagnosis present

## 2017-02-11 DIAGNOSIS — Z885 Allergy status to narcotic agent status: Secondary | ICD-10-CM

## 2017-02-11 DIAGNOSIS — E785 Hyperlipidemia, unspecified: Secondary | ICD-10-CM | POA: Diagnosis present

## 2017-02-11 HISTORY — PX: LOWER EXTREMITY ANGIOGRAPHY: CATH118251

## 2017-02-11 HISTORY — PX: PERIPHERAL ATHRECTOMY: SHX6227

## 2017-02-11 HISTORY — PX: HEMATOMA EVACUATION: SHX5118

## 2017-02-11 HISTORY — PX: PERIPHERAL VASCULAR ATHERECTOMY: CATH118256

## 2017-02-11 LAB — CBC
HCT: 26.6 % — ABNORMAL LOW (ref 39.0–52.0)
Hemoglobin: 8.9 g/dL — ABNORMAL LOW (ref 13.0–17.0)
MCH: 31.3 pg (ref 26.0–34.0)
MCHC: 33.5 g/dL (ref 30.0–36.0)
MCV: 93.7 fL (ref 78.0–100.0)
PLATELETS: 169 10*3/uL (ref 150–400)
RBC: 2.84 MIL/uL — ABNORMAL LOW (ref 4.22–5.81)
RDW: 16.2 % — AB (ref 11.5–15.5)
WBC: 7.7 10*3/uL (ref 4.0–10.5)

## 2017-02-11 LAB — POCT ACTIVATED CLOTTING TIME
ACTIVATED CLOTTING TIME: 219 s
ACTIVATED CLOTTING TIME: 263 s
ACTIVATED CLOTTING TIME: 257 s
Activated Clotting Time: 169 seconds

## 2017-02-11 LAB — POCT I-STAT 4, (NA,K, GLUC, HGB,HCT)
GLUCOSE: 146 mg/dL — AB (ref 65–99)
HCT: 18 % — ABNORMAL LOW (ref 39.0–52.0)
Hemoglobin: 6.1 g/dL — CL (ref 13.0–17.0)
Potassium: 3.8 mmol/L (ref 3.5–5.1)
SODIUM: 139 mmol/L (ref 135–145)

## 2017-02-11 LAB — PREPARE RBC (CROSSMATCH)

## 2017-02-11 LAB — GLUCOSE, CAPILLARY
GLUCOSE-CAPILLARY: 144 mg/dL — AB (ref 65–99)
Glucose-Capillary: 211 mg/dL — ABNORMAL HIGH (ref 65–99)
Glucose-Capillary: 174 mg/dL — ABNORMAL HIGH (ref 65–99)

## 2017-02-11 LAB — ABO/RH: ABO/RH(D): A POS

## 2017-02-11 SURGERY — LOWER EXTREMITY ANGIOGRAPHY
Anesthesia: LOCAL

## 2017-02-11 SURGERY — EVACUATION HEMATOMA
Anesthesia: General | Site: Groin | Laterality: Right

## 2017-02-11 MED ORDER — LORATADINE 10 MG PO TABS
10.0000 mg | ORAL_TABLET | Freq: Every day | ORAL | Status: DC
  Administered 2017-02-12 – 2017-02-17 (×6): 10 mg via ORAL
  Filled 2017-02-11 (×6): qty 1

## 2017-02-11 MED ORDER — SODIUM CHLORIDE 0.9 % IV SOLN
INTRAVENOUS | Status: DC | PRN
  Administered 2017-02-11: 22:00:00

## 2017-02-11 MED ORDER — SODIUM CHLORIDE 0.9 % WEIGHT BASED INFUSION: 3.0000 mL/kg/h | INTRAVENOUS | Status: DC

## 2017-02-11 MED ORDER — PROPOFOL 10 MG/ML IV BOLUS
INTRAVENOUS | Status: AC
  Filled 2017-02-11: qty 40

## 2017-02-11 MED ORDER — LIDOCAINE HCL (PF) 1 % IJ SOLN
INTRAMUSCULAR | Status: DC | PRN
  Administered 2017-02-11: 30 mL via INTRADERMAL

## 2017-02-11 MED ORDER — FENTANYL CITRATE (PF) 100 MCG/2ML IJ SOLN
INTRAMUSCULAR | Status: AC
  Filled 2017-02-11: qty 2

## 2017-02-11 MED ORDER — ONDANSETRON HCL 4 MG/2ML IJ SOLN: 4.0000 mg | Freq: Four times a day (QID) | INTRAMUSCULAR | Status: DC | PRN

## 2017-02-11 MED ORDER — HYDROMORPHONE HCL 1 MG/ML IJ SOLN
1.0000 mg | INTRAMUSCULAR | Status: DC | PRN
Start: 2017-02-11 — End: 2017-02-17
  Administered 2017-02-12: 1 mg via INTRAVENOUS
  Filled 2017-02-11 (×2): qty 1

## 2017-02-11 MED ORDER — ACETAMINOPHEN 500 MG PO TABS: 1000.0000 mg | ORAL_TABLET | Freq: Four times a day (QID) | ORAL | Status: DC | PRN

## 2017-02-11 MED ORDER — PROPOFOL 500 MG/50ML IV EMUL: INTRAVENOUS | Status: DC | PRN

## 2017-02-11 MED ORDER — NEOSTIGMINE METHYLSULFATE 10 MG/10ML IV SOLN
INTRAVENOUS | Status: DC | PRN
  Administered 2017-02-11: 3 mg via INTRAVENOUS

## 2017-02-11 MED ORDER — CLOPIDOGREL BISULFATE 75 MG PO TABS: 75.0000 mg | ORAL_TABLET | Freq: Every day | ORAL | Status: DC

## 2017-02-11 MED ORDER — MIDAZOLAM HCL 2 MG/2ML IJ SOLN
INTRAMUSCULAR | Status: AC
  Filled 2017-02-11: qty 2

## 2017-02-11 MED ORDER — SODIUM CHLORIDE 0.9 % WEIGHT BASED INFUSION: 1.0000 mL/kg/h | INTRAVENOUS | Status: DC

## 2017-02-11 MED ORDER — MIDAZOLAM HCL 2 MG/2ML IJ SOLN
INTRAMUSCULAR | Status: DC | PRN
  Administered 2017-02-11 (×2): 1 mg via INTRAVENOUS

## 2017-02-11 MED ORDER — ASPIRIN 81 MG PO CHEW
CHEWABLE_TABLET | ORAL | Status: AC
  Administered 2017-02-11: 09:00:00
  Filled 2017-02-11: qty 1

## 2017-02-11 MED ORDER — SODIUM CHLORIDE 0.9 % IV SOLN
INTRAVENOUS | Status: AC
  Administered 2017-02-11: 22:00:00 via INTRAVENOUS

## 2017-02-11 MED ORDER — HEPARIN SODIUM (PORCINE) 1000 UNIT/ML IJ SOLN
INTRAMUSCULAR | Status: DC | PRN
  Administered 2017-02-11: 4000 [IU] via INTRAVENOUS
  Administered 2017-02-11: 10000 [IU] via INTRAVENOUS

## 2017-02-11 MED ORDER — CEFAZOLIN SODIUM-DEXTROSE 2-4 GM/100ML-% IV SOLN
INTRAVENOUS | Status: AC
  Filled 2017-02-11: qty 100

## 2017-02-11 MED ORDER — CLOPIDOGREL BISULFATE 75 MG PO TABS
75.0000 mg | ORAL_TABLET | Freq: Every day | ORAL | Status: DC
  Administered 2017-02-12 – 2017-02-17 (×5): 75 mg via ORAL
  Filled 2017-02-11 (×7): qty 1

## 2017-02-11 MED ORDER — RISPERIDONE 0.5 MG PO TABS
0.5000 mg | ORAL_TABLET | Freq: Every day | ORAL | Status: DC
  Administered 2017-02-12 – 2017-02-17 (×6): 0.5 mg via ORAL
  Filled 2017-02-11 (×8): qty 1

## 2017-02-11 MED ORDER — FENTANYL CITRATE (PF) 100 MCG/2ML IJ SOLN
INTRAMUSCULAR | Status: DC | PRN
  Administered 2017-02-11 (×2): 25 ug via INTRAVENOUS

## 2017-02-11 MED ORDER — SODIUM CHLORIDE 0.9 % IV SOLN
INTRAVENOUS | Status: DC
  Administered 2017-02-11 (×3): via INTRAVENOUS

## 2017-02-11 MED ORDER — ASPIRIN EC 81 MG PO TBEC
81.0000 mg | DELAYED_RELEASE_TABLET | Freq: Every day | ORAL | Status: DC
  Administered 2017-02-12 – 2017-02-17 (×6): 81 mg via ORAL
  Filled 2017-02-11 (×6): qty 1

## 2017-02-11 MED ORDER — ATORVASTATIN CALCIUM 80 MG PO TABS
80.0000 mg | ORAL_TABLET | Freq: Every day | ORAL | Status: DC
  Administered 2017-02-12 – 2017-02-16 (×5): 80 mg via ORAL
  Filled 2017-02-11 (×5): qty 1

## 2017-02-11 MED ORDER — SODIUM CHLORIDE 0.9 % IV SOLN: 500.0000 mL | Freq: Once | INTRAVENOUS | Status: DC | PRN

## 2017-02-11 MED ORDER — FENTANYL CITRATE (PF) 100 MCG/2ML IJ SOLN
12.5000 ug | Freq: Once | INTRAMUSCULAR | Status: AC
Start: 2017-02-11 — End: 2017-02-11
  Administered 2017-02-11: 12.5 ug via INTRAVENOUS

## 2017-02-11 MED ORDER — HYDRALAZINE HCL 20 MG/ML IJ SOLN
10.0000 mg | INTRAMUSCULAR | Status: DC | PRN
  Administered 2017-02-12 (×2): 10 mg via INTRAVENOUS
  Filled 2017-02-11 (×2): qty 1

## 2017-02-11 MED ORDER — SUCCINYLCHOLINE CHLORIDE 20 MG/ML IJ SOLN
INTRAMUSCULAR | Status: DC | PRN
  Administered 2017-02-11: 120 mg via INTRAVENOUS

## 2017-02-11 MED ORDER — PROMETHAZINE HCL 25 MG/ML IJ SOLN: 6.2500 mg | INTRAMUSCULAR | Status: DC | PRN

## 2017-02-11 MED ORDER — GLYCOPYRROLATE 0.2 MG/ML IJ SOLN
INTRAMUSCULAR | Status: DC | PRN
  Administered 2017-02-11: .4 mg via INTRAVENOUS

## 2017-02-11 MED ORDER — IODIXANOL 320 MG/ML IV SOLN
INTRAVENOUS | Status: DC | PRN
  Administered 2017-02-11: 215 mL via INTRA_ARTERIAL

## 2017-02-11 MED ORDER — CEFAZOLIN SODIUM-DEXTROSE 2-3 GM-% IV SOLR
INTRAVENOUS | Status: DC | PRN
  Administered 2017-02-11: 2 g via INTRAVENOUS

## 2017-02-11 MED ORDER — HEPARIN (PORCINE) IN NACL 2-0.9 UNIT/ML-% IJ SOLN
INTRAMUSCULAR | Status: AC
  Filled 2017-02-11: qty 500

## 2017-02-11 MED ORDER — SODIUM CHLORIDE 0.9 % IV BOLUS (SEPSIS): 250.0000 mL | Freq: Once | INTRAVENOUS | Status: AC

## 2017-02-11 MED ORDER — NORTRIPTYLINE HCL 25 MG PO CAPS
50.0000 mg | ORAL_CAPSULE | Freq: Every day | ORAL | Status: DC
  Administered 2017-02-12 – 2017-02-16 (×5): 50 mg via ORAL
  Filled 2017-02-11 (×6): qty 2

## 2017-02-11 MED ORDER — HEPARIN (PORCINE) IN NACL 2-0.9 UNIT/ML-% IJ SOLN
INTRAMUSCULAR | Status: AC | PRN
  Administered 2017-02-11: 1000 mL via INTRA_ARTERIAL

## 2017-02-11 MED ORDER — FENTANYL CITRATE (PF) 100 MCG/2ML IJ SOLN
INTRAMUSCULAR | Status: DC | PRN
  Administered 2017-02-11: 100 ug via INTRAVENOUS

## 2017-02-11 MED ORDER — PHENYLEPHRINE HCL 10 MG/ML IJ SOLN
INTRAVENOUS | Status: DC | PRN
  Administered 2017-02-11: 100 ug/min via INTRAVENOUS
  Administered 2017-02-11: 20 ug/min via INTRAVENOUS

## 2017-02-11 MED ORDER — TRAZODONE HCL 50 MG PO TABS
50.0000 mg | ORAL_TABLET | Freq: Every day | ORAL | Status: DC
  Administered 2017-02-12 – 2017-02-16 (×5): 50 mg via ORAL
  Filled 2017-02-11 (×6): qty 1

## 2017-02-11 MED ORDER — INSULIN GLARGINE 100 UNIT/ML ~~LOC~~ SOLN
36.0000 [IU] | Freq: Every day | SUBCUTANEOUS | Status: DC
  Administered 2017-02-12 – 2017-02-16 (×3): 36 [IU] via SUBCUTANEOUS
  Filled 2017-02-11 (×7): qty 0.36

## 2017-02-11 MED ORDER — ACETAMINOPHEN 325 MG PO TABS: 650.0000 mg | ORAL_TABLET | ORAL | Status: DC | PRN

## 2017-02-11 MED ORDER — LABETALOL HCL 5 MG/ML IV SOLN: 5.0000 mg | INTRAVENOUS | Status: DC | PRN

## 2017-02-11 MED ORDER — MEPERIDINE HCL 25 MG/ML IJ SOLN: 6.2500 mg | INTRAMUSCULAR | Status: DC | PRN

## 2017-02-11 MED ORDER — LIDOCAINE HCL (PF) 1 % IJ SOLN
INTRAMUSCULAR | Status: AC
  Filled 2017-02-11: qty 30

## 2017-02-11 MED ORDER — SODIUM CHLORIDE 0.9 % IR SOLN
Status: DC | PRN
  Administered 2017-02-11: 2000 mL

## 2017-02-11 MED ORDER — LACTATED RINGERS IV SOLN: INTRAVENOUS | Status: DC | PRN

## 2017-02-11 MED ORDER — PHENYLEPHRINE HCL 10 MG/ML IJ SOLN
INTRAMUSCULAR | Status: DC | PRN
  Administered 2017-02-11 (×2): 120 ug via INTRAVENOUS

## 2017-02-11 MED ORDER — CINACALCET HCL 30 MG PO TABS
30.0000 mg | ORAL_TABLET | Freq: Every evening | ORAL | Status: DC
  Administered 2017-02-12 – 2017-02-16 (×5): 30 mg via ORAL
  Filled 2017-02-11 (×6): qty 1

## 2017-02-11 MED ORDER — ALBUMIN HUMAN 5 % IV SOLN
INTRAVENOUS | Status: DC | PRN
  Administered 2017-02-11: 21:00:00 via INTRAVENOUS

## 2017-02-11 MED ORDER — SODIUM CHLORIDE 0.9 % IV SOLN: Freq: Once | INTRAVENOUS | Status: DC

## 2017-02-11 MED ORDER — ASPIRIN 81 MG PO CHEW: 81.0000 mg | CHEWABLE_TABLET | ORAL | Status: DC

## 2017-02-11 MED ORDER — SODIUM CHLORIDE 0.9% FLUSH: 3.0000 mL | INTRAVENOUS | Status: DC | PRN

## 2017-02-11 MED ORDER — FENTANYL CITRATE (PF) 100 MCG/2ML IJ SOLN
25.0000 ug | INTRAMUSCULAR | Status: DC | PRN
  Administered 2017-02-11: 50 ug via INTRAVENOUS

## 2017-02-11 MED ORDER — LEVOTHYROXINE SODIUM 25 MCG PO TABS
125.0000 ug | ORAL_TABLET | Freq: Every day | ORAL | Status: DC
  Administered 2017-02-12 – 2017-02-17 (×6): 125 ug via ORAL
  Filled 2017-02-11 (×6): qty 1

## 2017-02-11 MED ORDER — BUDESONIDE 0.5 MG/2ML IN SUSP: 0.5000 mg | Freq: Four times a day (QID) | RESPIRATORY_TRACT | Status: DC | PRN

## 2017-02-11 MED ORDER — PRO-STAT SUGAR FREE PO LIQD
30.0000 mL | Freq: Two times a day (BID) | ORAL | Status: DC
  Administered 2017-02-12 – 2017-02-17 (×6): 30 mL via ORAL
  Filled 2017-02-11 (×6): qty 30

## 2017-02-11 MED ORDER — LACTATED RINGERS IV SOLN: INTRAVENOUS | Status: DC

## 2017-02-11 MED ORDER — ONDANSETRON HCL 4 MG/2ML IJ SOLN
INTRAMUSCULAR | Status: DC | PRN
  Administered 2017-02-11: 4 mg via INTRAVENOUS

## 2017-02-11 MED ORDER — FENTANYL CITRATE (PF) 250 MCG/5ML IJ SOLN
INTRAMUSCULAR | Status: AC
  Filled 2017-02-11: qty 5

## 2017-02-11 MED ORDER — PROPOFOL 10 MG/ML IV BOLUS
INTRAVENOUS | Status: DC | PRN
  Administered 2017-02-11: 150 mg via INTRAVENOUS

## 2017-02-11 MED ORDER — ROCURONIUM BROMIDE 100 MG/10ML IV SOLN
INTRAVENOUS | Status: DC | PRN
  Administered 2017-02-11: 30 mg via INTRAVENOUS

## 2017-02-11 MED ORDER — HEPARIN SODIUM (PORCINE) 1000 UNIT/ML IJ SOLN
INTRAMUSCULAR | Status: AC
  Filled 2017-02-11: qty 1

## 2017-02-11 SURGICAL SUPPLY — 28 items
BALLN ARMADA 2.5X40X150 (BALLOONS) ×3
BALLN IN.PACT DCB 6X60 (BALLOONS) ×6
BALLOON ARMADA 2.5X40X150 (BALLOONS) ×2 IMPLANT
CATH ANGIO 5F PIGTAIL 65CM (CATHETERS) ×3 IMPLANT
CATH CROSS OVER TEMPO 5F (CATHETERS) ×3 IMPLANT
CATH CXI SUPP ST 2.6FR 150CM (CATHETERS) ×3 IMPLANT
CATH HAWKONE LS STANDARD TIP (CATHETERS) ×3
CATH HAWKONE LS STD TIP (CATHETERS) ×2 IMPLANT
CATH STRAIGHT 5FR 65CM (CATHETERS) IMPLANT
DCB IN.PACT 6X60 (BALLOONS) ×4 IMPLANT
DEVICE CONTINUOUS FLUSH (MISCELLANEOUS) ×3 IMPLANT
DEVICE SPIDERFX EMB PROT 6MM (WIRE) ×3 IMPLANT
GUIDEWIRE ANGLED .035X150CM (WIRE) ×3 IMPLANT
KIT ENCORE 26 ADVANTAGE (KITS) ×3 IMPLANT
KIT PV (KITS) ×3 IMPLANT
SHEATH PINNACLE 5F 10CM (SHEATH) ×3 IMPLANT
SHEATH PINNACLE 7F 10CM (SHEATH) ×3 IMPLANT
SHEATH PINNACLE 8F 10CM (SHEATH) ×3 IMPLANT
SHEATH PINNACLE MP 7F 45CM (SHEATH) ×3 IMPLANT
STOPCOCK MORSE 400PSI 3WAY (MISCELLANEOUS) ×3 IMPLANT
SYR MEDRAD MARK V 150ML (SYRINGE) ×3 IMPLANT
TAPE RADIOPAQUE TURBO (MISCELLANEOUS) ×3 IMPLANT
TRANSDUCER W/STOPCOCK (MISCELLANEOUS) ×3 IMPLANT
TRAY PV CATH (CUSTOM PROCEDURE TRAY) ×3 IMPLANT
TUBING CIL FLEX 10 FLL-RA (TUBING) ×3 IMPLANT
WIRE HITORQ VERSACORE ST 145CM (WIRE) ×3 IMPLANT
WIRE ROSEN-J .035X180CM (WIRE) ×3 IMPLANT
WIRE SPARTACORE .014X300CM (WIRE) ×9 IMPLANT

## 2017-02-11 SURGICAL SUPPLY — 51 items
BANDAGE ESMARK 6X9 LF (GAUZE/BANDAGES/DRESSINGS) IMPLANT
BNDG ESMARK 6X9 LF (GAUZE/BANDAGES/DRESSINGS)
CANISTER SUCT 3000ML PPV (MISCELLANEOUS) ×2 IMPLANT
CANNULA VESSEL 3MM 2 BLNT TIP (CANNULA) ×4 IMPLANT
CUFF TOURNIQUET SINGLE 18IN (TOURNIQUET CUFF) IMPLANT
CUFF TOURNIQUET SINGLE 24IN (TOURNIQUET CUFF) IMPLANT
CUFF TOURNIQUET SINGLE 34IN LL (TOURNIQUET CUFF) IMPLANT
CUFF TOURNIQUET SINGLE 44IN (TOURNIQUET CUFF) IMPLANT
DERMABOND ADVANCED (GAUZE/BANDAGES/DRESSINGS)
DERMABOND ADVANCED .7 DNX12 (GAUZE/BANDAGES/DRESSINGS) IMPLANT
DRAIN HEMOVAC 1/8 X 5 (WOUND CARE) ×4 IMPLANT
DRAIN SNY 10X20 3/4 PERF (WOUND CARE) IMPLANT
DRAPE ORTHO SPLIT 77X108 STRL (DRAPES)
DRAPE SURG ORHT 6 SPLT 77X108 (DRAPES) IMPLANT
DRSG COVADERM 4X8 (GAUZE/BANDAGES/DRESSINGS) ×2 IMPLANT
ELECT REM PT RETURN 9FT ADLT (ELECTROSURGICAL) ×2
ELECTRODE REM PT RTRN 9FT ADLT (ELECTROSURGICAL) ×1 IMPLANT
EVACUATOR SILICONE 100CC (DRAIN) ×4 IMPLANT
GAUZE SPONGE 4X4 12PLY STRL (GAUZE/BANDAGES/DRESSINGS) ×2 IMPLANT
GLOVE BIO SURGEON STRL SZ 6.5 (GLOVE) ×4 IMPLANT
GLOVE BIO SURGEON STRL SZ7.5 (GLOVE) ×4 IMPLANT
GLOVE BIOGEL PI IND STRL 6 (GLOVE) ×1 IMPLANT
GLOVE BIOGEL PI IND STRL 6.5 (GLOVE) ×1 IMPLANT
GLOVE BIOGEL PI IND STRL 7.0 (GLOVE) ×1 IMPLANT
GLOVE BIOGEL PI INDICATOR 6 (GLOVE) ×1
GLOVE BIOGEL PI INDICATOR 6.5 (GLOVE) ×1
GLOVE BIOGEL PI INDICATOR 7.0 (GLOVE) ×1
GLOVE SURG SS PI 7.5 STRL IVOR (GLOVE) ×2 IMPLANT
GLOVE SURG SS PI 8.0 STRL IVOR (GLOVE) ×2 IMPLANT
GOWN STRL REUS W/ TWL LRG LVL3 (GOWN DISPOSABLE) ×4 IMPLANT
GOWN STRL REUS W/TWL LRG LVL3 (GOWN DISPOSABLE) ×4
KIT BASIN OR (CUSTOM PROCEDURE TRAY) ×2 IMPLANT
KIT ROOM TURNOVER OR (KITS) ×2 IMPLANT
NS IRRIG 1000ML POUR BTL (IV SOLUTION) ×4 IMPLANT
PACK GENERAL/GYN (CUSTOM PROCEDURE TRAY) ×2 IMPLANT
PACK UNIVERSAL I (CUSTOM PROCEDURE TRAY) IMPLANT
PAD ARMBOARD 7.5X6 YLW CONV (MISCELLANEOUS) ×4 IMPLANT
SPONGE LAP 18X18 X RAY DECT (DISPOSABLE) ×2 IMPLANT
SPONGE SURGIFOAM ABS GEL 100 (HEMOSTASIS) IMPLANT
STAPLER VISISTAT 35W (STAPLE) ×2 IMPLANT
SUT ETHILON 3 0 PS 1 (SUTURE) ×4 IMPLANT
SUT PROLENE 5 0 C 1 24 (SUTURE) ×12 IMPLANT
SUT PROLENE 6 0 CC (SUTURE) ×10 IMPLANT
SUT VIC AB 2-0 CTX 36 (SUTURE) ×2 IMPLANT
SUT VIC AB 2-0 SH 18 (SUTURE) ×2 IMPLANT
SUT VIC AB 3-0 SH 27 (SUTURE) ×1
SUT VIC AB 3-0 SH 27X BRD (SUTURE) ×1 IMPLANT
TAPE CLOTH SURG 4X10 WHT LF (GAUZE/BANDAGES/DRESSINGS) ×2 IMPLANT
UNDERPAD 30X30 (UNDERPADS AND DIAPERS) ×2 IMPLANT
WATER STERILE IRR 1000ML POUR (IV SOLUTION) ×2 IMPLANT
YANKAUER SUCT BULB TIP NO VENT (SUCTIONS) ×2 IMPLANT

## 2017-02-11 NOTE — Anesthesia Procedure Notes (Signed)
Central Venous Catheter Insertion Performed by: Effie Berkshire, anesthesiologist Start/End7/09/2016 9:42 PM, 02/11/2017 9:50 PM Patient location: Pre-op. Preanesthetic checklist: patient identified, IV checked, site marked, risks and benefits discussed, surgical consent, monitors and equipment checked, pre-op evaluation, timeout performed and anesthesia consent Position: Trendelenburg Lidocaine 1% used for infiltration and patient sedated Hand hygiene performed , maximum sterile barriers used  and Seldinger technique used Catheter size: 8 Fr Total catheter length 16. Central line was placed.Double lumen Procedure performed using ultrasound guided technique. Ultrasound Notes:anatomy identified, needle tip was noted to be adjacent to the nerve/plexus identified, no ultrasound evidence of intravascular and/or intraneural injection and image(s) printed for medical record Attempts: 1 Following insertion, dressing applied, line sutured and Biopatch. Post procedure assessment: blood return through all ports  Patient tolerated the procedure well with no immediate complications.

## 2017-02-11 NOTE — Consult Note (Addendum)
Referring Physician: Tollie Eth, MD  Patient name: [name] MRN: [id number] DOB: Jul 10, 1951 Sex: male  REASON FOR CONSULT: groin  HPI: [name] is a 66 y.o. male,s/p left leg atherectomy left SFA via right femoral approach by Dr Gwenlyn Found about 3 pm today.  Sheath was pulled and pt subsequently formed large right groin hematoma which has continued to expand despite pressure for the last hour.  Pt has been overall fairly stable but BP drifting 80-90s.  Currently receiving fluid bolus.  He has some pain in the right groin but not abdomen.  Procedure describes oozing around sheath that was upsized to an 8 sheath at end of procedure.  Other medical problems include ESRD, DM, hypertension, neuropathy, COPD which have been stable..  Procedure was done for left foot wound.  Past Medical History:  Diagnosis Date  . Anemia   . Anxiety   . Arthritis   . Chronic kidney disease   . COPD (chronic obstructive pulmonary disease) ([place])    Pt denies  . Depression   . Diabetes mellitus without complication ([place])   . GERD (gastroesophageal reflux disease)   . H/O hiatal hernia   . Headache(784.0)    Hx: Migraines  . Hypertension   . Neuropathy   . Numbness of toes    toes and feet  . Pneumonia    Hx: of several times  . Renal insufficiency   . Shortness of breath dyspnea   . Type 2 diabetes mellitus ([place])    Past Surgical History:  Procedure Laterality Date  . AV FISTULA PLACEMENT  2012      left arm   . AV FISTULA PLACEMENT Left 12/18/2012   Procedure: ARTERIOVENOUS (AV) FISTULA CREATION;  Surgeon: Angelia Mould, MD;  Location: [place];  Service: Vascular;  Laterality: Left;  . COLONOSCOPY  10/26/2011   Procedure: COLONOSCOPY;  Surgeon: Rogene Houston, MD;  [facility];  Service: Endoscopy;  Laterality: N/A;  730  . EMBOLECTOMY Left 12/09/2012   Procedure: EMBOLECTOMY;  Surgeon: Serafina Mitchell, MD;  Location: [facility] CATH LAB;  Service: Cardiovascular;   Laterality: Left;  left arm venous embolization  . ESOPHAGOGASTRODUODENOSCOPY (EGD) WITH ESOPHAGEAL DILATION N/A 04/23/2013   Procedure: ESOPHAGOGASTRODUODENOSCOPY (EGD) WITH ESOPHAGEAL DILATION;  Surgeon: Rogene Houston, MD;  [facility];  Service: Endoscopy;  Laterality: N/A;  200-moved to 930   . FISTULA SUPERFICIALIZATION Left 06/18/2013   Procedure: FISTULA SUPERFICIALIZATION & LIGATION BRANCH X 1;  Surgeon: Mal Misty, MD;  Location: [place];  Service: Vascular;  Laterality: Left;  . INGUINAL HERNIA REPAIR     ,  times   2  . INSERTION OF DIALYSIS CATHETER Left 12/18/2012   Procedure: INSERTION OF DIALYSIS CATHETER;  Surgeon: Angelia Mould, MD;  Location: [place];  Service: Vascular;  Laterality: Left;  . KNEE ARTHROSCOPY  2011   Right Knee  . PERIPHERAL ATHRECTOMY  02/11/2017  . REVISON OF ARTERIOVENOUS FISTULA Left 1/82/9937   Procedure: PLICATION OF LEFT BRACHIOCEPHALIC ARTERIOVENOUS FISTULA;  Surgeon: Conrad Okay, MD;  Location: [place];  Service: Vascular;  Laterality: Left;  . SHUNTOGRAM N/A 12/09/2012   Procedure: fistulogram;  Surgeon: Serafina Mitchell, MD;  Location: [facility] CATH LAB;  Service: Cardiovascular;  Laterality: N/A;  . SHUNTOGRAM Left 06/03/2013   Procedure: Fistulogram;  Surgeon: Serafina Mitchell, MD;  Location: [facility] CATH LAB;  Service: Cardiovascular;  Laterality: Left;  . THROMBECTOMY W/ EMBOLECTOMY Left 12/11/2012  Procedure: THROMBECTOMY ARTERIOVENOUS FISTULA;  Surgeon: Serafina Mitchell, MD;  Location: [place];  Service: Vascular;  Laterality: Left;  . TONSILLECTOMY      Family History  Problem Relation Age of Onset  . Heart disease Father        Heart Disease before age 46    SOCIAL HISTORY: Social History   Social History  . Marital status: Married    Spouse name: N/A  . Number of children: N/A  . Years of education: N/A   Occupational History  . Not on file.   Social History Main Topics  . Smoking status: Former Smoker    Years: 10.00     Types: Cigars    Quit date: 03/13/2012  . Smokeless tobacco: Never Used  . Alcohol use No  . Drug use: No  . Sexual activity: No   Other Topics Concern  . Not on file   Social History Narrative  . No narrative on file    Allergies  Allergen Reactions  . Morphine And Related Other (See Comments)    Not effective  . Zaroxolyn [Metolazone]     Current Facility-Administered Medications  Medication Dose Route Frequency Provider Last Rate Last Dose  . 0.9 %  sodium chloride infusion   Intravenous Continuous Lorretta Harp, MD 10 mL/hr at 02/11/17 [phone]    . 0.9 %  sodium chloride infusion   Intravenous Continuous Lorretta Harp, MD      . 0.9 %  sodium chloride infusion   Intravenous Once [name], MD      . acetaminophen (TYLENOL) tablet 650 mg  650 mg Oral Q4H PRN Lorretta Harp, MD      . Derrill Memo ON 02/12/2017] aspirin EC tablet 81 mg  81 mg Oral Daily Lorretta Harp, MD      . atorvastatin (LIPITOR) tablet 80 mg  80 mg Oral QHS Lorretta Harp, MD      . budesonide (PULMICORT) nebulizer solution 0.5 mg  0.5 mg Nebulization Q6H PRN Lorretta Harp, MD      . cinacalcet [facility]) tablet 30 mg  30 mg Oral QPM Lorretta Harp, MD      . Derrill Memo ON 02/12/2017] clopidogrel (PLAVIX) tablet 75 mg  75 mg Oral Q breakfast Lorretta Harp, MD      . feeding supplement (PRO-STAT SUGAR FREE 64) liquid 30 mL  30 mL Oral BID Lorretta Harp, MD      . fentaNYL (SUBLIMAZE) 100 MCG/2ML injection           . hydrALAZINE (APRESOLINE) injection 10 mg  10 mg Intravenous Q4H PRN Lorretta Harp, MD      . HYDROmorphone (DILAUDID) injection 1 mg  1 mg Intravenous Q2H PRN Eileen Stanford, PA-C      . insulin glargine (LANTUS) injection 36 Units  36 Units Subcutaneous QHS Lorretta Harp, MD      . Derrill Memo ON 02/12/2017] levothyroxine (SYNTHROID, LEVOTHROID) tablet 125 mcg  125 mcg Oral QAC breakfast Lorretta Harp, MD      . Derrill Memo ON 02/12/2017] loratadine (CLARITIN) tablet  10 mg  10 mg Oral Daily Lorretta Harp, MD      . nortriptyline (PAMELOR) capsule 50 mg  50 mg Oral QHS Lorretta Harp, MD      . ondansetron [facility]) injection 4 mg  4 mg Intravenous Q6H PRN Lorretta Harp, MD      . risperiDONE (RISPERDAL) tablet 0.5  mg  0.5 mg Oral Daily Lorretta Harp, MD      . traZODone (DESYREL) tablet 50 mg  50 mg Oral Daily Lorretta Harp, MD        ROS:   General:  No weight loss, Fever, chills  HEENT: No recent headaches, no nasal bleeding, no visual changes, no sore throat  Neurologic: No dizziness, blackouts, seizures. No recent symptoms of stroke or mini- stroke. No recent episodes of slurred speech, or temporary blindness.  Cardiac: No recent episodes of chest pain/pressure, no shortness of breath at rest.  + shortness of breath with exertion.  Denies history of atrial fibrillation or irregular heartbeat  Vascular: No history of rest pain in feet has numbness in feet.  + No history of non-healing ulcer, No history of DVT   Pulmonary: No home oxygen, no productive cough, no hemoptysis,  No asthma or wheezing  Musculoskeletal:  [X]  Arthritis, [ ]  Low back pain,  [X]  Joint pain  Hematologic:No history of hypercoagulable state.  No history of easy bleeding.  No history of anemia  Gastrointestinal: No hematochezia or melena,  No gastroesophageal reflux, no trouble swallowing  Urinary: [X]  chronic Kidney disease, [X]  on HD - [ ]  MWF or [ ]  TTHS, [ ]  Burning with urination, [ ]  Frequent urination, [ ]  Difficulty urinating;   Skin: No rashes  Psychological: No history of anxiety,  No history of depression   Physical Examination  Vitals:   02/11/17 1451 02/11/17 1456 02/11/17 1501 02/11/17 1506  BP: (!) 155/82 (!) 155/85 (!) 155/88   Pulse: 78 76 75 96  Resp: 16 16 12  (!) 26  Temp:      TempSrc:      SpO2: 100% 100% 100% 100%  Weight:      Height:        Body mass index is 28 kg/m.  General:  Alert and oriented, no acute  distress HEENT: Normal Cardiac: Regular Rate and Rhythm  Abdomen: Soft, non-tender, non-distended, no mass Skin: No rash, ecchymosis with thinned out skin right medial thigh and groin with expanding hematoma, skin is very thinned out and fragile in appearance Extremity Pulses:  2+ radial, brachial, absent femoral, doppler dorsalis pedis, posterior tibial pulses bilaterally Musculoskeletal: trace pretibial and pedal edema bilaterally Neurologic: Upper and lower extremity motor 5/5 and symmetric  DATA:   Pre procedure labs CBC    Component Value Date/Time   WBC 6.6 02/08/2017 1409   WBC 7.5 06/18/2016 1622   RBC 3.64 (L) 02/08/2017 1409   RBC 3.35 (L) 06/18/2016 1622   HGB 11.5 (L) 02/08/2017 1409   HCT 34.3 (L) 02/08/2017 1409   PLT 208 02/08/2017 1409   MCV 94 02/08/2017 1409   MCH 31.6 02/08/2017 1409   MCH 32.5 06/18/2016 1622   MCHC 33.5 02/08/2017 1409   MCHC 33.5 06/18/2016 1622   RDW 16.7 (H) 02/08/2017 1409   LYMPHSABS 2.1 02/08/2017 1409   MONOABS 0.7 06/18/2016 1622   EOSABS 0.2 02/08/2017 1409   BASOSABS 0.0 02/08/2017 1409   BMET    Component Value Date/Time   NA 134 02/08/2017 1403   K 4.1 02/08/2017 1403   CL 85 (L) 02/08/2017 1403   CO2 29 02/08/2017 1403   GLUCOSE 198 (H) 02/08/2017 1403   GLUCOSE 141 (H) 06/18/2016 1622   BUN 67 (H) 02/08/2017 1403   CREATININE 6.72 (H) 02/08/2017 1403   CALCIUM 9.3 02/08/2017 1403   GFRNONAA 8 (L) 02/08/2017 1403   GFRAA  9 (L) 02/08/2017 1403     ASSESSMENT:  Right groin hematoma with skin and hemodynamic compromise   PLAN:  To OR for repair of artery and evacuate hematoma.  Risk benefits discussed with pt and wife bleeding infection loss of skin need for transfusion, possibly bleed to death without repair.   [name], MD [facility] [place] Office: [phone] Pager: [phone]

## 2017-02-11 NOTE — Progress Notes (Signed)
Asked to see patient with expanding hematoma following peripheral vascular procedure.  The nurses been holding pressure for about an hour and the patient has a large expanding hematoma.  His blood pressure has dropped some to around 110.  Reading the operative note the sheath was upgraded from a 7 French sheath to an 8 [place] sheath because of oozing at the end of the procedure.  On examination he is quite uncomfortable with significant pain and the nurses are still holding pressure.  He has a fairly tense hematoma below the inguinal ligament and extending into the scrotum somewhat.  His blood pressure is currently 165 systolic.  Lungs are clear.  1.  Post sheath pull : Expanding hematoma currently not controlled  Recommendations:  I asked Dr. [name] of vascular surgery to see the patient.  M concerned about the inability to get hemostasis at this time.  Pressure continues to be held by the nurses.  Kerry Hough MD [facility] 7:49 PM

## 2017-02-11 NOTE — Progress Notes (Signed)
Patient's sheath pulled at 1830. Hematoma noted medial to site small, Pressure held for 5 minutes, expressed hematoma, then noted hematoma inner medial thigh. Noted hematoma enlarging. Called cath lab and Dr. Gwenlyn Found. Instructed to put on femostop but continued holding pressure because unable to place femostop properly with site of hematoma. Angelena Form PA into see patient after paged. Dr. Wynonia Lawman into see patient and explained that he was calling Dr. [name], vascular surgeon to come and assess the site. Instructed to continue holding pressure until Dr. [name] arrived. Hematoma measured at 1915 was 32 cm from lateral thigh to inner thigh and 12 cm from pelvic area to mid thigh. Dr. [name] into see patient and review plan for surgery to evacuate hematoma and stitch artery. Report to Roselle Zarsona RN and Roselle holding pressure after 1940. Dr. Gwenlyn Found called to check on patient status and updated on above. Will follow up with patient.

## 2017-02-11 NOTE — Anesthesia Procedure Notes (Signed)
Arterial Line Insertion Start/End7/09/2016 9:46 PM, 02/11/2017 9:52 PM Performed by: Effie Berkshire, Verdell Dykman L, CRNA  Patient location: OR. Emergency situation Right, radial was placed Catheter size: 20 G Hand hygiene performed  and maximum sterile barriers used   Attempts: 1 Procedure performed without using ultrasound guided technique. Following insertion, dressing applied. Post procedure assessment: normal  Patient tolerated the procedure well with no immediate complications. Additional procedure comments: Unable to assess allen's test    Pt hypotensive on high dose phenylephrine .

## 2017-02-11 NOTE — Progress Notes (Addendum)
Received pt @19 :00 with Right groin hematoma level"3" approximately 32 x 12 cm. Manual pressure is on going applied by [employer]. Fentanyl 12.5 mg IV given x 1 dose as ordered. At 19:50 BP=83/50 mmHg started to drop. NS bolus 250 ml given as ordered. BP=107/53 after NS bolus. Pt seen & examined  by Dr Wynonia Lawman & Dr Eden Lathe. Wife made aware by Dr Eden Lathe. Rapid response made aware. Pt signed procedure & blood consent.Report given to [facility].Transferred to OR with Darlene RN & 1 transport @ 20:33 without difficulty.

## 2017-02-11 NOTE — Op Note (Signed)
Procedure: Evacuation of hematoma right groin, repair of right femoral pseudoaneurysm  Preoperative diagnosis: Hematoma right groin with right femoral pseudoaneurysm  Postoperative diagnosis: Same  Anesthesia: Gen.  Assistant: Leontine Locket PA-C  Operative findings: #1 high common femoral bifurcation  #2  2 separate needle holes repaired one at the origin of the profunda, 1 approximately 4 cm distal to the superficial femoral artery origin  Operative details: After obtaining informed consent, the patient taken the operating room. The patient was placed supine position operating table. After induction of general anesthesia and endotracheal intubation, the patient's entire right lower extremity was prepped and draped in usual sterile fashion. Next longitudinal incision was made in the right groin incorporating the area of the needle puncture as well as over the area of the right common femoral artery. The patient's blood pressure remained in the low 28Z systolic. So we hesitated at this point in order to allow for blood to arrive from the blood bank for further resuscitation before entering the hematoma cavity. Dissection was carried down low inguinal ligament. The common femoral artery was dissected free circumferentially just at the level of the inguinal ligament. We encountered some bleeding lateral to the femoral artery in this location. This was temporarily controlled with digital pressure. After fluid resuscitation had commenced, we entered the hematoma cavity. There was bright red blood immediately coming from the hematoma cavity. After the hematoma cavity contents had been completely removed and digital pressure was used to control the bleeding. The superficial femoral artery was dissected free circumferentially below the area of dissection. A vessel loop was placed around this. The area of bleeding was then controlled with several 5-0 Prolene figure-of-eight sutures. The artery was dissected  free circumferentially and it was discovered that the patient had a fairly high takeoff of the profunda. There had also been bleeding from the lateral aspect of the common femoral artery up high and there was a hole in the profunda femoris which also required several 6-0 Prolene sutures. Several small venous tributaries were ligated and divided between silk ties or ligated with 5-0 Prolene sutures. There was a large hematoma cavity approximately 10 x 10 cm extending medial limb of the thigh. This was thoroughly irrigated with normal saline solution. The skin was very thinned out compromised. A #10 flat Jackson-Pratt drain was placed into this hematoma cavity and brought out through separate stab incision on the medial aspect of the right thigh. The artery was again carefully inspected and found to be hemostatic. The remainder of the wound was also hemostatic. This was also thoroughly irrigated with normal saline solution. The deep layers of the groin were closed over the right common femoral artery. An additional 10 flat Jackson-Pratt drain was then placed into the hematoma cavity and a more superficial space and this also was brought out through separate stab incision on the more lateral aspect of the thigh. This was secured to the skin with nylon stitch as well as the other JP drain. Then proceeded to close the more superficial layer of the groin with a running 3-0 Vicryl suture. The skin was then closed staples. Both JPs were charged. After blood and fluid resuscitation and control the hemorrhage the patient did stabilize. The patient was extubated in the operating room and taken to recovery in stable condition.  [name], MD [facility] [place] Office: [phone] Pager: [phone]

## 2017-02-11 NOTE — Transfer of Care (Signed)
Immediate Anesthesia Transfer of Care Note  Patient: [name]  Procedure(s) Performed: Procedure(s): EVACUATION HEMATOMA RIGHT GROIN, Repair of Right Pseudo-anerysm. (Right)  Patient Location: PACU  Anesthesia Type:General  Level of Consciousness: awake, alert , oriented and patient cooperative  Airway & Oxygen Therapy: Patient Spontanous Breathing and Patient connected to nasal cannula oxygen  Post-op Assessment: Report given to RN, Post -op Vital signs reviewed and stable and Patient moving all extremities  Post vital signs: Reviewed and stable  Last Vitals:  Vitals:   02/11/17 2025 02/11/17 2307  BP: (!) 116/54 129/65  Pulse: 75 78  Resp: 11 11  Temp: 36.3 C 36.3 C    Last Pain:  Vitals:   02/11/17 2025  TempSrc: Oral  PainSc:          Complications: No apparent anesthesia complications

## 2017-02-11 NOTE — Interval H&P Note (Signed)
History and Physical Interval Note:  02/11/2017 12:52 PM  [name]  has presented today for surgery, with the diagnosis of claudication  The various methods of treatment have been discussed with the patient and family. After consideration of risks, benefits and other options for treatment, the patient has consented to  Procedure(s): Lower Extremity Angiography (N/A) as a surgical intervention .  The patient's history has been reviewed, patient examined, no change in status, stable for surgery.  I have reviewed the patient's chart and labs.  Questions were answered to the patient's satisfaction.     Quay Burow

## 2017-02-11 NOTE — Anesthesia Preprocedure Evaluation (Addendum)
Anesthesia Evaluation  Patient identified by MRN, date of birth, ID band Patient awake    Reviewed: Allergy & Precautions, NPO status , Patient's Chart, lab work & pertinent test resultsPreop documentation limited or incomplete due to emergent nature of procedure.  Airway Mallampati: I  TM Distance: >3 FB Neck ROM: Full    Dental  (+) Edentulous Upper, Edentulous Lower   Pulmonary COPD, former smoker,    breath sounds clear to auscultation       Cardiovascular hypertension, + Peripheral Vascular Disease   Rhythm:Regular Rate:Normal     Neuro/Psych  Headaches, PSYCHIATRIC DISORDERS Anxiety Depression    GI/Hepatic Neg liver ROS, hiatal hernia, GERD  Medicated,  Endo/Other  diabetes, Type 2  Renal/GU CRF and DialysisRenal disease  negative genitourinary   Musculoskeletal  (+) Arthritis , Osteoarthritis,    Abdominal   Peds negative pediatric ROS (+)  Hematology negative hematology ROS (+)   Anesthesia Other Findings   Reproductive/Obstetrics negative OB ROS                           Lab Results  Component Value Date   WBC 6.6 02/08/2017   HGB 11.5 (L) 02/08/2017   HCT 34.3 (L) 02/08/2017   MCV 94 02/08/2017   PLT 208 02/08/2017   Lab Results  Component Value Date   CREATININE 6.72 (H) 02/08/2017   BUN 67 (H) 02/08/2017   NA 134 02/08/2017   K 4.1 02/08/2017   CL 85 (L) 02/08/2017   CO2 29 02/08/2017   Lab Results  Component Value Date   INR 1.1 02/08/2017   INR 0.94 11/27/2010   INR 0.97 08/24/2010   02/2017 EKG: normal sinus rhythm, 1st degree AV block.  Anesthesia Physical Anesthesia Plan  ASA: IV and emergent  Anesthesia Plan: General   Post-op Pain Management:    Induction: Intravenous  PONV Risk Score and Plan: 3 and Ondansetron, Dexamethasone, Propofol and Midazolam  Airway Management Planned: Oral ETT  Additional Equipment: Arterial line and Ultrasound  Guidance Line Placement  Intra-op Plan:   Post-operative Plan: Extubation in OR  Informed Consent: I have reviewed the patients History and Physical, chart, labs and discussed the procedure including the risks, benefits and alternatives for the proposed anesthesia with the patient or authorized representative who has indicated his/her understanding and acceptance.   Dental advisory given  Plan Discussed with: CRNA  Anesthesia Plan Comments:      Anesthesia Quick Evaluation

## 2017-02-11 NOTE — Anesthesia Procedure Notes (Signed)
Procedure Name: Intubation Date/Time: 02/11/2017 8:48 PM Performed by: Eligha Bridegroom Pre-anesthesia Checklist: Patient identified, Emergency Drugs available, Suction available, Patient being monitored and Timeout performed Oxygen Delivery Method: Circle system utilized Preoxygenation: Pre-oxygenation with 100% oxygen Intubation Type: IV induction Laryngoscope Size: Mac and 4 Tube size: 7.5 mm Number of attempts: 1 Airway Equipment and Method: Stylet Placement Confirmation: ETT inserted through vocal cords under direct vision and positive ETCO2 Secured at: 22 cm Tube secured with: Tape Dental Injury: Teeth and Oropharynx as per pre-operative assessment

## 2017-02-11 NOTE — H&P (View-Only) (Signed)
02/08/2017 [name]   06-25-1951  [id number]  Primary Physician Caryl Bis, MD Primary Cardiologist: Lorretta Harp MD Renae Gloss  HPI:  [name] is referred by The rehabilitation center in [facility] for evaluation of peripheral arterial disease. He has a history of diabetes and hyperlipidemia. He stopped smoking 5 years ago. He has never had a heart attack or stroke. 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 He is currently in a rehabilitation facility. He just completed 5 weeks of intravenous antibiotics. He has a significant left heel ulcer with a right heel ulcer that has since healed. He did have Dopplers performed in the rehabilitation Center that showed significant peripheral vascular disease.   Current Outpatient Prescriptions  Medication Sig Dispense Refill  . atorvastatin (LIPITOR) 80 MG tablet Take 80 mg by mouth at bedtime.  5  . calcium acetate (PHOSLO) 667 MG capsule Take 1 capsule by mouth 3 (three) times daily.  10  . DULoxetine (CYMBALTA) 60 MG capsule Take 60 mg by mouth at bedtime. Reported on 03/01/2016  3  . FLUoxetine (PROZAC) 40 MG capsule Take 40 mg by mouth at bedtime.  6  . gabapentin (NEURONTIN) 300 MG capsule Take 600 mg by mouth 3 (three) times daily.   3  . levothyroxine (SYNTHROID, LEVOTHROID) 150 MCG tablet Take 150 mcg by mouth daily before breakfast.    . loratadine (CLARITIN) 10 MG tablet Take 10 mg by mouth daily.    Marland Kitchen LYRICA 50 MG capsule Take 1 tablet by mouth 3 (three) times daily.  4  . mirtazapine (REMERON) 15 MG tablet Take 7.5 mg by mouth at bedtime.  10  . nortriptyline (PAMELOR) 25 MG capsule Take 1 capsule by mouth at bedtime.  10  . Probiotic Product (PHILLIPS COLON HEALTH PO) Take 1 capsule by  mouth daily as needed (constipation).    . ranitidine (ZANTAC) 150 MG tablet Take 150 mg by mouth 2 (two) times daily.     . risperiDONE (RISPERDAL) 0.5 MG tablet Take 1 tablet by mouth daily.  10  . TOUJEO SOLOSTAR 300 UNIT/ML SOPN Inject 36 Units as directed at bedtime.  10  . traZODone (DESYREL) 50 MG tablet Take 1 tablet by mouth daily.  10   No current facility-administered medications for this visit.     Allergies  Allergen Reactions  . Morphine And Related Other (See Comments)    Not effective  . Zaroxolyn [Metolazone]     Social History   Social History  . Marital status: Married    Spouse name: N/A  . Number of children: N/A  . Years of education: N/A   Occupational History  . Not on file.   Social History Main Topics  . Smoking status: Former Smoker    Years: 10.00    Types: Cigars    Quit date: 03/13/2012  . Smokeless tobacco: Never Used  . Alcohol use No  . Drug use: No  . Sexual activity: No   Other Topics Concern  . Not on file   Social History Narrative  . No narrative on file     Review of Systems: General: negative for chills, fever, night sweats or weight changes.  Cardiovascular: negative for chest pain, dyspnea on exertion,  edema, orthopnea, palpitations, paroxysmal nocturnal dyspnea or shortness of breath Dermatological: negative for rash Respiratory: negative for cough or wheezing Urologic: negative for hematuria Abdominal: negative for nausea, vomiting, diarrhea, bright red blood per rectum, melena, or hematemesis Neurologic: negative for visual changes, syncope, or dizziness All other systems reviewed and are otherwise negative except as noted above.    Blood pressure (!) 153/83, pulse 88, height 6\' 4"  (1.93 m), weight 230 lb 12.8 oz (104.7 kg).  General appearance: no distress Neck: no adenopathy, no JVD, supple, symmetrical, trachea midline, thyroid not enlarged, symmetric, no tenderness/mass/nodules and left carotid bruit most likely  from his AV fistula Lungs: clear to auscultation bilaterally Heart: regular rate and rhythm, S1, S2 normal, no murmur, click, rub or gallop Extremities: left heel ulcer  EKG Sinus rhythm at 88 with right bundle branch block and left axis deviation. I personally reviewed this EKG.  ASSESSMENT AND PLAN:   PVD (peripheral vascular disease) [facility]) [name] was referred for evaluation of a left heel ulcer consistent with critical limb ischemia. He has a history of remote tobacco abuse, treated hyperlipidemia and chronic renal insufficiency on hemodialysis for the last 5 years. He does have diabetic peripheral neuropathy. Never had a heart attack or stroke. He denies chest pain or shortness of breath. He's had a left heel ulcer for several months but he is really unaware because of his lack of feeling. He is in a rehabilitation facility currently and just completed 5 weeks of intravenous antibiotics. Recent Dopplers were performed in facility suggest significant peripheral vascular disease.      Lorretta Harp MD FACP,FACC,FAHA, [facility] 02/08/2017 11:41 AM

## 2017-02-12 ENCOUNTER — Encounter (HOSPITAL_COMMUNITY): Payer: Self-pay | Admitting: Cardiovascular Disease

## 2017-02-12 DIAGNOSIS — R2681 Unsteadiness on feet: Secondary | ICD-10-CM | POA: Diagnosis not present

## 2017-02-12 DIAGNOSIS — E1122 Type 2 diabetes mellitus with diabetic chronic kidney disease: Secondary | ICD-10-CM | POA: Diagnosis not present

## 2017-02-12 DIAGNOSIS — N2581 Secondary hyperparathyroidism of renal origin: Secondary | ICD-10-CM | POA: Diagnosis not present

## 2017-02-12 DIAGNOSIS — D631 Anemia in chronic kidney disease: Secondary | ICD-10-CM | POA: Diagnosis not present

## 2017-02-12 DIAGNOSIS — Z992 Dependence on renal dialysis: Secondary | ICD-10-CM | POA: Diagnosis not present

## 2017-02-12 DIAGNOSIS — N186 End stage renal disease: Secondary | ICD-10-CM | POA: Diagnosis not present

## 2017-02-12 DIAGNOSIS — I739 Peripheral vascular disease, unspecified: Secondary | ICD-10-CM | POA: Diagnosis present

## 2017-02-12 DIAGNOSIS — L97429 Non-pressure chronic ulcer of left heel and midfoot with unspecified severity: Secondary | ICD-10-CM | POA: Diagnosis present

## 2017-02-12 DIAGNOSIS — E875 Hyperkalemia: Secondary | ICD-10-CM | POA: Diagnosis not present

## 2017-02-12 DIAGNOSIS — E8889 Other specified metabolic disorders: Secondary | ICD-10-CM | POA: Diagnosis present

## 2017-02-12 DIAGNOSIS — R296 Repeated falls: Secondary | ICD-10-CM | POA: Diagnosis not present

## 2017-02-12 DIAGNOSIS — E039 Hypothyroidism, unspecified: Secondary | ICD-10-CM | POA: Diagnosis not present

## 2017-02-12 DIAGNOSIS — M6281 Muscle weakness (generalized): Secondary | ICD-10-CM | POA: Diagnosis not present

## 2017-02-12 DIAGNOSIS — I724 Aneurysm of artery of lower extremity: Secondary | ICD-10-CM | POA: Diagnosis present

## 2017-02-12 DIAGNOSIS — I63512 Cerebral infarction due to unspecified occlusion or stenosis of left middle cerebral artery: Secondary | ICD-10-CM | POA: Diagnosis not present

## 2017-02-12 DIAGNOSIS — E11621 Type 2 diabetes mellitus with foot ulcer: Secondary | ICD-10-CM | POA: Diagnosis present

## 2017-02-12 DIAGNOSIS — E1140 Type 2 diabetes mellitus with diabetic neuropathy, unspecified: Secondary | ICD-10-CM | POA: Diagnosis not present

## 2017-02-12 DIAGNOSIS — E1151 Type 2 diabetes mellitus with diabetic peripheral angiopathy without gangrene: Secondary | ICD-10-CM | POA: Diagnosis present

## 2017-02-12 DIAGNOSIS — D62 Acute posthemorrhagic anemia: Secondary | ICD-10-CM | POA: Diagnosis not present

## 2017-02-12 DIAGNOSIS — M25559 Pain in unspecified hip: Secondary | ICD-10-CM | POA: Diagnosis not present

## 2017-02-12 DIAGNOSIS — F411 Generalized anxiety disorder: Secondary | ICD-10-CM | POA: Diagnosis not present

## 2017-02-12 DIAGNOSIS — I998 Other disorder of circulatory system: Secondary | ICD-10-CM | POA: Diagnosis not present

## 2017-02-12 DIAGNOSIS — I97618 Postprocedural hemorrhage and hematoma of a circulatory system organ or structure following other circulatory system procedure: Secondary | ICD-10-CM | POA: Diagnosis present

## 2017-02-12 DIAGNOSIS — J9601 Acute respiratory failure with hypoxia: Secondary | ICD-10-CM | POA: Diagnosis not present

## 2017-02-12 DIAGNOSIS — L97419 Non-pressure chronic ulcer of right heel and midfoot with unspecified severity: Secondary | ICD-10-CM | POA: Diagnosis not present

## 2017-02-12 DIAGNOSIS — S301XXD Contusion of abdominal wall, subsequent encounter: Secondary | ICD-10-CM | POA: Diagnosis not present

## 2017-02-12 DIAGNOSIS — Z79899 Other long term (current) drug therapy: Secondary | ICD-10-CM | POA: Diagnosis not present

## 2017-02-12 DIAGNOSIS — F339 Major depressive disorder, recurrent, unspecified: Secondary | ICD-10-CM | POA: Diagnosis not present

## 2017-02-12 DIAGNOSIS — Z885 Allergy status to narcotic agent status: Secondary | ICD-10-CM | POA: Diagnosis not present

## 2017-02-12 DIAGNOSIS — R4182 Altered mental status, unspecified: Secondary | ICD-10-CM | POA: Diagnosis not present

## 2017-02-12 DIAGNOSIS — I120 Hypertensive chronic kidney disease with stage 5 chronic kidney disease or end stage renal disease: Secondary | ICD-10-CM | POA: Diagnosis not present

## 2017-02-12 DIAGNOSIS — K219 Gastro-esophageal reflux disease without esophagitis: Secondary | ICD-10-CM | POA: Diagnosis present

## 2017-02-12 DIAGNOSIS — I2510 Atherosclerotic heart disease of native coronary artery without angina pectoris: Secondary | ICD-10-CM | POA: Diagnosis not present

## 2017-02-12 DIAGNOSIS — L7632 Postprocedural hematoma of skin and subcutaneous tissue following other procedure: Secondary | ICD-10-CM | POA: Diagnosis present

## 2017-02-12 DIAGNOSIS — E785 Hyperlipidemia, unspecified: Secondary | ICD-10-CM | POA: Diagnosis present

## 2017-02-12 DIAGNOSIS — Z87891 Personal history of nicotine dependence: Secondary | ICD-10-CM | POA: Diagnosis not present

## 2017-02-12 DIAGNOSIS — J449 Chronic obstructive pulmonary disease, unspecified: Secondary | ICD-10-CM | POA: Diagnosis present

## 2017-02-12 DIAGNOSIS — R278 Other lack of coordination: Secondary | ICD-10-CM | POA: Diagnosis not present

## 2017-02-12 DIAGNOSIS — I1311 Hypertensive heart and chronic kidney disease without heart failure, with stage 5 chronic kidney disease, or end stage renal disease: Secondary | ICD-10-CM | POA: Diagnosis present

## 2017-02-12 DIAGNOSIS — I10 Essential (primary) hypertension: Secondary | ICD-10-CM | POA: Diagnosis not present

## 2017-02-12 DIAGNOSIS — E1142 Type 2 diabetes mellitus with diabetic polyneuropathy: Secondary | ICD-10-CM | POA: Diagnosis present

## 2017-02-12 DIAGNOSIS — R2689 Other abnormalities of gait and mobility: Secondary | ICD-10-CM | POA: Diagnosis not present

## 2017-02-12 DIAGNOSIS — Z888 Allergy status to other drugs, medicaments and biological substances status: Secondary | ICD-10-CM | POA: Diagnosis not present

## 2017-02-12 DIAGNOSIS — G4700 Insomnia, unspecified: Secondary | ICD-10-CM | POA: Diagnosis present

## 2017-02-12 DIAGNOSIS — N185 Chronic kidney disease, stage 5: Secondary | ICD-10-CM | POA: Diagnosis not present

## 2017-02-12 DIAGNOSIS — F5104 Psychophysiologic insomnia: Secondary | ICD-10-CM | POA: Diagnosis not present

## 2017-02-12 DIAGNOSIS — I999 Unspecified disorder of circulatory system: Secondary | ICD-10-CM | POA: Diagnosis not present

## 2017-02-12 DIAGNOSIS — I70203 Unspecified atherosclerosis of native arteries of extremities, bilateral legs: Secondary | ICD-10-CM | POA: Diagnosis present

## 2017-02-12 LAB — CBC
HCT: 26.5 % — ABNORMAL LOW (ref 39.0–52.0)
HCT: 26.7 % — ABNORMAL LOW (ref 39.0–52.0)
Hemoglobin: 8.7 g/dL — ABNORMAL LOW (ref 13.0–17.0)
Hemoglobin: 8.8 g/dL — ABNORMAL LOW (ref 13.0–17.0)
MCH: 30.5 pg (ref 26.0–34.0)
MCH: 30.9 pg (ref 26.0–34.0)
MCHC: 32.8 g/dL (ref 30.0–36.0)
MCHC: 33 g/dL (ref 30.0–36.0)
MCV: 93 fL (ref 78.0–100.0)
MCV: 93.7 fL (ref 78.0–100.0)
PLATELETS: 176 10*3/uL (ref 150–400)
Platelets: 194 10*3/uL (ref 150–400)
RBC: 2.85 MIL/uL — ABNORMAL LOW (ref 4.22–5.81)
RBC: 2.85 MIL/uL — ABNORMAL LOW (ref 4.22–5.81)
RDW: 17.1 % — AB (ref 11.5–15.5)
RDW: 17.5 % — ABNORMAL HIGH (ref 11.5–15.5)
WBC: 6.9 10*3/uL (ref 4.0–10.5)
WBC: 6.5 10*3/uL (ref 4.0–10.5)

## 2017-02-12 LAB — BLOOD PRODUCT ORDER (VERBAL) VERIFICATION

## 2017-02-12 LAB — BASIC METABOLIC PANEL
ANION GAP: 13 (ref 5–15)
BUN: 105 mg/dL — AB (ref 6–20)
CALCIUM: 8.1 mg/dL — AB (ref 8.9–10.3)
CO2: 23 mmol/L (ref 22–32)
CREATININE: 8.95 mg/dL — AB (ref 0.61–1.24)
Chloride: 100 mmol/L — ABNORMAL LOW (ref 101–111)
GFR calc Af Amer: 6 mL/min — ABNORMAL LOW (ref >60)
GFR, EST NON AFRICAN AMERICAN: 5 mL/min — AB (ref >60)
GLUCOSE: 174 mg/dL — AB (ref 65–99)
Potassium: 4.6 mmol/L (ref 3.5–5.1)
Sodium: 136 mmol/L (ref 135–145)

## 2017-02-12 LAB — GLUCOSE, CAPILLARY
GLUCOSE-CAPILLARY: 235 mg/dL — AB (ref 65–99)
GLUCOSE-CAPILLARY: 218 mg/dL — AB (ref 65–99)
Glucose-Capillary: 158 mg/dL — ABNORMAL HIGH (ref 65–99)
Glucose-Capillary: 161 mg/dL — ABNORMAL HIGH (ref 65–99)

## 2017-02-12 LAB — POCT I-STAT 4, (NA,K, GLUC, HGB,HCT)
GLUCOSE: 116 mg/dL — AB (ref 65–99)
HEMATOCRIT: 24 % — AB (ref 39.0–52.0)
HEMOGLOBIN: 8.2 g/dL — AB (ref 13.0–17.0)
Potassium: 7.5 mmol/L (ref 3.5–5.1)
Sodium: 135 mmol/L (ref 135–145)

## 2017-02-12 MED ORDER — PENTAFLUOROPROP-TETRAFLUOROETH EX AERO: 1.0000 "application " | INHALATION_SPRAY | CUTANEOUS | Status: DC | PRN

## 2017-02-12 MED ORDER — CHLORHEXIDINE GLUCONATE CLOTH 2 % EX PADS
6.0000 | MEDICATED_PAD | Freq: Every day | CUTANEOUS | Status: DC
  Administered 2017-02-12 – 2017-02-15 (×3): 6 via TOPICAL

## 2017-02-12 MED ORDER — INSULIN ASPART 100 UNIT/ML ~~LOC~~ SOLN
0.0000 [IU] | Freq: Three times a day (TID) | SUBCUTANEOUS | Status: DC
Start: 2017-02-12 — End: 2017-02-17
  Administered 2017-02-12: 2 [IU] via SUBCUTANEOUS
  Administered 2017-02-13: 1 [IU] via SUBCUTANEOUS
  Administered 2017-02-13: 2 [IU] via SUBCUTANEOUS
  Administered 2017-02-13: 3 [IU] via SUBCUTANEOUS
  Administered 2017-02-14 – 2017-02-15 (×2): 5 [IU] via SUBCUTANEOUS
  Administered 2017-02-15 (×2): 2 [IU] via SUBCUTANEOUS
  Administered 2017-02-16: 3 [IU] via SUBCUTANEOUS
  Administered 2017-02-16: 2 [IU] via SUBCUTANEOUS
  Administered 2017-02-17: 5 [IU] via SUBCUTANEOUS
  Administered 2017-02-17: 2 [IU] via SUBCUTANEOUS

## 2017-02-12 MED ORDER — ALTEPLASE 2 MG IJ SOLR: 2.0000 mg | Freq: Once | INTRAMUSCULAR | Status: DC | PRN

## 2017-02-12 MED ORDER — COLLAGENASE 250 UNIT/GM EX OINT
TOPICAL_OINTMENT | Freq: Every day | CUTANEOUS | Status: DC
  Administered 2017-02-12 – 2017-02-14 (×3): 1 via TOPICAL
  Administered 2017-02-15 – 2017-02-17 (×3): via TOPICAL
  Filled 2017-02-12 (×3): qty 30

## 2017-02-12 MED ORDER — SODIUM CHLORIDE 0.9 % IV SOLN: 100.0000 mL | INTRAVENOUS | Status: DC | PRN

## 2017-02-12 MED ORDER — OXYCODONE-ACETAMINOPHEN 5-325 MG PO TABS
1.0000 | ORAL_TABLET | ORAL | Status: DC | PRN
  Administered 2017-02-12 (×3): 1 via ORAL
  Administered 2017-02-13: 2 via ORAL
  Administered 2017-02-13: 1 via ORAL
  Administered 2017-02-16: 2 via ORAL
  Filled 2017-02-12 (×2): qty 1
  Filled 2017-02-12: qty 2
  Filled 2017-02-12: qty 1
  Filled 2017-02-12: qty 2
  Filled 2017-02-12: qty 1

## 2017-02-12 MED ORDER — CALCIUM ACETATE (PHOS BINDER) 667 MG PO CAPS
1334.0000 mg | ORAL_CAPSULE | Freq: Three times a day (TID) | ORAL | Status: DC
  Administered 2017-02-12 – 2017-02-17 (×12): 1334 mg via ORAL
  Filled 2017-02-12 (×13): qty 2

## 2017-02-12 MED ORDER — DOXERCALCIFEROL 4 MCG/2ML IV SOLN
1.0000 ug | INTRAVENOUS | Status: DC
  Administered 2017-02-14 – 2017-02-16 (×2): 1 ug via INTRAVENOUS
  Filled 2017-02-12 (×2): qty 2

## 2017-02-12 MED ORDER — LIDOCAINE HCL (PF) 1 % IJ SOLN: 5.0000 mL | INTRAMUSCULAR | Status: DC | PRN

## 2017-02-12 MED ORDER — SODIUM CHLORIDE 0.9% FLUSH: 10.0000 mL | INTRAVENOUS | Status: DC | PRN

## 2017-02-12 MED ORDER — LIDOCAINE-PRILOCAINE 2.5-2.5 % EX CREA
1.0000 "application " | TOPICAL_CREAM | CUTANEOUS | Status: DC | PRN
  Filled 2017-02-12: qty 5

## 2017-02-12 MED ORDER — HEPARIN SODIUM (PORCINE) 1000 UNIT/ML DIALYSIS: 1000.0000 [IU] | INTRAMUSCULAR | Status: DC | PRN

## 2017-02-12 MED ORDER — SODIUM CHLORIDE 0.9% FLUSH
10.0000 mL | Freq: Two times a day (BID) | INTRAVENOUS | Status: DC
  Administered 2017-02-12 – 2017-02-17 (×6): 10 mL

## 2017-02-12 MED ORDER — DEXTROSE 5 % IV SOLN
1.5000 g | Freq: Two times a day (BID) | INTRAVENOUS | Status: AC
  Administered 2017-02-12 (×2): 1.5 g via INTRAVENOUS
  Filled 2017-02-12 (×2): qty 1.5

## 2017-02-12 MED ORDER — DARBEPOETIN ALFA 100 MCG/0.5ML IJ SOSY: 100.0000 ug | PREFILLED_SYRINGE | INTRAMUSCULAR | Status: DC

## 2017-02-12 NOTE — Consult Note (Addendum)
[facility] Nurse wound consult note Reason for Consult: Consult requested for left heel.  Pt states has been using Santyl prior to admission and recently received re-vascularization by the vascular team. Wound type: Left heel with chronic full thickness wound Pressure Injury POA: This wound was present on admission Measurement: 7X8cm Wound bed: 50% red and moist, 40% tightly adhered eschar, 10% yellow slough Drainage (amount, consistency, odor) Mod amt yellow drainage, no odor Periwound: Dry calloused skin surrounding outer wound Dressing procedure/placement/frequency: Continue plan of care with Santyl for enzymatic debridement of nonviable tissue.  Float heel to reduce pressure. He can follow-up with the vascular team after discharge. Discussed plan of care and pt verbalized understanding. Please re-consult if further assistance is needed.  Thank-you,  Julien Girt MSN, [place], [place], [place], [place]

## 2017-02-12 NOTE — Consult Note (Signed)
Reason for Consult: To manage dialysis and dialysis related needs Referring Physician: Drewey Begue is an 66 y.o. male known to [place] as previously received HD at [facility]- now at [facility].  He has history of COPD, tobacco abuse, DM , HTN and ESRD.  He presented to hospital for claudication and heel ulcer workup including a lower extremity arteriogram with atherectomy- done 7/2.  After sheath was pulled had hematoma that continued to expand. VVS was called and he  Required an evacuation of hematoma and repair of femoral pseudoaneurysm late last night.  We are asked to see him because today is his dialysis day.  He is sitting up in bedside chair, c/o pain in his right side/groin- also jerking    Dialyzes at [place] TTS  EDW 103.5. 4 1/2 hours HD Bath 2K/2.5 calc, Dialyzer unknown, Heparin yes. Access AVF. 15 gauge, 400 BFR 8000 of epo q tx, 1 mcg of hect q tx, 50 venofer 1 time a week   Past Medical History:  Diagnosis Date  . Anemia   . Anxiety   . Arthritis   . Chronic kidney disease   . COPD (chronic obstructive pulmonary disease) ([place])    Pt denies  . Depression   . Diabetes mellitus without complication ([place])   . GERD (gastroesophageal reflux disease)   . H/O hiatal hernia   . Headache(784.0)    Hx: Migraines  . Hypertension   . Neuropathy   . Numbness of toes    toes and feet  . Pneumonia    Hx: of several times  . Renal insufficiency   . Shortness of breath dyspnea   . Type 2 diabetes mellitus ([place])     Past Surgical History:  Procedure Laterality Date  . AV FISTULA PLACEMENT  2012      left arm   . AV FISTULA PLACEMENT Left 12/18/2012   Procedure: ARTERIOVENOUS (AV) FISTULA CREATION;  Surgeon: Angelia Mould, MD;  Location: [place];  Service: Vascular;  Laterality: Left;  . COLONOSCOPY  10/26/2011   Procedure: COLONOSCOPY;  Surgeon: Rogene Houston, MD;  [facility];  Service: Endoscopy;  Laterality: N/A;  730  . EMBOLECTOMY  Left 12/09/2012   Procedure: EMBOLECTOMY;  Surgeon: Serafina [place], MD;  Location: [facility] CATH LAB;  Service: Cardiovascular;  Laterality: Left;  left arm venous embolization  . ESOPHAGOGASTRODUODENOSCOPY (EGD) WITH ESOPHAGEAL DILATION N/A 04/23/2013   Procedure: ESOPHAGOGASTRODUODENOSCOPY (EGD) WITH ESOPHAGEAL DILATION;  Surgeon: Rogene Houston, MD;  [facility];  Service: Endoscopy;  Laterality: N/A;  200-moved to 930   . FISTULA SUPERFICIALIZATION Left 06/18/2013   Procedure: FISTULA SUPERFICIALIZATION & LIGATION BRANCH X 1;  Surgeon: Mal Misty, MD;  Location: [place];  Service: Vascular;  Laterality: Left;  . HEMATOMA EVACUATION Right 02/11/2017   Procedure: EVACUATION HEMATOMA RIGHT GROIN, Repair of Right Pseudo-anerysm.;  Surgeon: [name], MD;  Location: [facility] OR;  Service: Vascular;  Laterality: Right;  . INGUINAL HERNIA REPAIR     ,  times   2  . INSERTION OF DIALYSIS CATHETER Left 12/18/2012   Procedure: INSERTION OF DIALYSIS CATHETER;  Surgeon: Angelia Mould, MD;  Location: [place];  Service: Vascular;  Laterality: Left;  . KNEE ARTHROSCOPY  2011   Right Knee  . LOWER EXTREMITY ANGIOGRAPHY N/A 02/11/2017   Procedure: Lower Extremity Angiography;  Surgeon: Lorretta Harp, MD;  Location: [facility] CV LAB;  Service: Cardiovascular;  Laterality: N/A;  . PERIPHERAL ATHRECTOMY  02/11/2017  . PERIPHERAL VASCULAR ATHERECTOMY Left 02/11/2017   Procedure: Peripheral Vascular Atherectomy;  Surgeon: Lorretta Harp, MD;  Location: [place] CV LAB;  Service: Cardiovascular;  Laterality: Left;  . REVISON OF ARTERIOVENOUS FISTULA Left 8/41/3244   Procedure: PLICATION OF LEFT BRACHIOCEPHALIC ARTERIOVENOUS FISTULA;  Surgeon: Conrad [place], MD;  Location: [place];  Service: Vascular;  Laterality: Left;  . SHUNTOGRAM N/A 12/09/2012   Procedure: fistulogram;  Surgeon: Serafina Mitchell, MD;  Location: [facility] CATH LAB;  Service: Cardiovascular;  Laterality: N/A;  . SHUNTOGRAM Left  06/03/2013   Procedure: Fistulogram;  Surgeon: Serafina Mitchell, MD;  Location: [facility] CATH LAB;  Service: Cardiovascular;  Laterality: Left;  . THROMBECTOMY W/ EMBOLECTOMY Left 12/11/2012   Procedure: THROMBECTOMY ARTERIOVENOUS FISTULA;  Surgeon: Serafina Mitchell, MD;  Location: [place];  Service: Vascular;  Laterality: Left;  . TONSILLECTOMY      Family History  Problem Relation Age of Onset  . Heart disease Father        Heart Disease before age 53    Social History:  reports that he quit smoking about 4 years ago. His smoking use included Cigars. He quit after 10.00 years of use. He has never used smokeless tobacco. He reports that he does not drink alcohol or use drugs.  Allergies:  Allergies  Allergen Reactions  . Morphine And Related Other (See Comments)    Not effective  . Zaroxolyn [Metolazone]     Medications: I have reviewed the patient's current medications.   Results for orders placed or performed during the hospital encounter of 02/11/17 (from the past 48 hour(s))  Glucose, capillary     Status: Abnormal   Collection Time: 02/11/17  8:03 AM  Result Value Ref Range   Glucose-Capillary 211 (H) 65 - 99 mg/dL  Glucose, capillary     Status: Abnormal   Collection Time: 02/11/17 12:58 PM  Result Value Ref Range   Glucose-Capillary 174 (H) 65 - 99 mg/dL  POCT Activated clotting time     Status: None   Collection Time: 02/11/17  1:50 PM  Result Value Ref Range   Activated Clotting Time 263 seconds  POCT Activated clotting time     Status: None   Collection Time: 02/11/17  2:29 PM  Result Value Ref Range   Activated Clotting Time 219 seconds  POCT Activated clotting time     Status: None   Collection Time: 02/11/17  2:50 PM  Result Value Ref Range   Activated Clotting Time 257 seconds  POCT Activated clotting time     Status: None   Collection Time: 02/11/17  6:13 PM  Result Value Ref Range   Activated Clotting Time 169 seconds  I-STAT 4, (NA,K, GLUC, HGB,HCT)      Status: Abnormal   Collection Time: 02/11/17  9:17 PM  Result Value Ref Range   Sodium 135 135 - 145 mmol/L   Potassium 7.5 (HH) 3.5 - 5.1 mmol/L   Glucose, Bld 116 (H) 65 - 99 mg/dL   HCT 24.0 (L) 39.0 - 52.0 %   Hemoglobin 8.2 (L) 13.0 - 17.0 g/dL  Type and screen [place]     Status: None   Collection Time: 02/11/17  9:30 PM  Result Value Ref Range   ABO/RH(D) A POS    Antibody Screen NEG    Sample Expiration 02/14/2017    Unit Number [id number]    Blood Component Type RED CELLS,LR  Unit division 00    Status of Unit ISSUED,FINAL    Unit tag comment VERBAL ORDERS PER DR FIELDS    Transfusion Status OK TO TRANSFUSE    Crossmatch Result COMPATIBLE    Unit Number [id number]    Blood Component Type RED CELLS,LR    Unit division 00    Status of Unit ISSUED,FINAL    Unit tag comment VERBAL ORDERS PER DR FIELDS    Transfusion Status OK TO TRANSFUSE    Crossmatch Result COMPATIBLE    Unit Number [id number]    Blood Component Type RED CELLS,LR    Unit division 00    Status of Unit REL FROM [facility]    Transfusion Status OK TO TRANSFUSE    Crossmatch Result Compatible    Unit Number [id number]    Blood Component Type RED CELLS,LR    Unit division 00    Status of Unit REL FROM [facility]    Transfusion Status OK TO TRANSFUSE    Crossmatch Result Compatible   Prepare RBC     Status: None   Collection Time: 02/11/17  9:30 PM  Result Value Ref Range   Order Confirmation ORDER PROCESSED BY BLOOD BANK   ABO/Rh     Status: None   Collection Time: 02/11/17  9:30 PM  Result Value Ref Range   ABO/RH(D) A POS   I-STAT 4, (NA,K, GLUC, HGB,HCT)     Status: Abnormal   Collection Time: 02/11/17 10:00 PM  Result Value Ref Range   Sodium 139 135 - 145 mmol/L   Potassium 3.8 3.5 - 5.1 mmol/L   Glucose, Bld 146 (H) 65 - 99 mg/dL   HCT 18.0 (L) 39.0 - 52.0 %   Hemoglobin 6.1 (LL) 13.0 - 17.0 g/dL  Glucose, capillary     Status: Abnormal   Collection Time:  02/11/17 11:08 PM  Result Value Ref Range   Glucose-Capillary 144 (H) 65 - 99 mg/dL  CBC     Status: Abnormal   Collection Time: 02/11/17 11:21 PM  Result Value Ref Range   WBC 7.7 4.0 - 10.5 K/uL   RBC 2.84 (L) 4.22 - 5.81 MIL/uL   Hemoglobin 8.9 (L) 13.0 - 17.0 g/dL    Comment: REPEATED TO VERIFY POST TRANSFUSION SPECIMEN    HCT 26.6 (L) 39.0 - 52.0 %   MCV 93.7 78.0 - 100.0 fL   MCH 31.3 26.0 - 34.0 pg   MCHC 33.5 30.0 - 36.0 g/dL   RDW 16.2 (H) 11.5 - 15.5 %   Platelets 169 150 - 400 K/uL  Glucose, capillary     Status: Abnormal   Collection Time: 02/12/17 12:41 AM  Result Value Ref Range   Glucose-Capillary 158 (H) 65 - 99 mg/dL   Comment 1 Capillary Specimen   Basic metabolic panel      Status: Abnormal   Collection Time: 02/12/17  4:16 AM  Result Value Ref Range   Sodium 136 135 - 145 mmol/L   Potassium 4.6 3.5 - 5.1 mmol/L    Comment: NO VISIBLE HEMOLYSIS   Chloride 100 (L) 101 - 111 mmol/L   CO2 23 22 - 32 mmol/L   Glucose, Bld 174 (H) 65 - 99 mg/dL   BUN 105 (H) 6 - 20 mg/dL   Creatinine, Ser 8.95 (H) 0.61 - 1.24 mg/dL   Calcium 8.1 (L) 8.9 - 10.3 mg/dL   GFR calc non Af Amer 5 (L) >60 mL/min   GFR calc Af Amer 6 (L) >60 mL/min  Comment: (NOTE) The eGFR has been calculated using the CKD EPI equation. This calculation has not been validated in all clinical situations. eGFR's persistently <60 mL/min signify possible Chronic Kidney Disease.    Anion gap 13 5 - 15  CBC     Status: Abnormal   Collection Time: 02/12/17  4:16 AM  Result Value Ref Range   WBC 6.9 4.0 - 10.5 K/uL   RBC 2.85 (L) 4.22 - 5.81 MIL/uL   Hemoglobin 8.7 (L) 13.0 - 17.0 g/dL   HCT 26.5 (L) 39.0 - 52.0 %   MCV 93.0 78.0 - 100.0 fL   MCH 30.5 26.0 - 34.0 pg   MCHC 32.8 30.0 - 36.0 g/dL   RDW 17.1 (H) 11.5 - 15.5 %   Platelets 176 150 - 400 K/uL  Provider-confirm verbal Blood Bank order - RBC; 2 Units; Order taken: 02/11/2017; 9:30 PM; Emergency Release, STAT 2 units of O negative red  cells emergency released to the OR @ 2157. All units were transfused.     Status: None   Collection Time: 02/12/17 10:00 AM  Result Value Ref Range   Blood product order confirm MD AUTHORIZATION REQUESTED     Dg Chest Port 1 View  Result Date: 02/12/2017 CLINICAL DATA:  Central line placement.  Initial encounter. EXAM: PORTABLE CHEST 1 VIEW COMPARISON:  Chest radiograph performed 02/08/2017, and CT of the chest performed 08/15/2016 FINDINGS: The right IJ line is noted ending about the mid SVC. The lungs are well-aerated and clear. There is no evidence of focal opacification, pleural effusion or pneumothorax. The cardiomediastinal silhouette is mildly enlarged. Prominence of the hila reflects vasculature, on correlation with prior CT. No acute osseous abnormalities are seen. IMPRESSION: 1. Right IJ line noted ending about the mid SVC. 2. Lungs grossly clear.  Mild cardiomegaly. Electronically Signed   By: Garald Balding M.D.   On: 02/12/2017 00:11    ROS: positive for right sided groin/leg pain, jerking, high BP- otherwise negative Blood pressure (!) 144/75, pulse 78, temperature 97.5 F (36.4 C), temperature source Axillary, resp. rate (!) 21, height _0  (1.93 m), weight 107.4 kg (236 lb 12.4 oz), SpO2 100 %. General appearance: alert and mild distress Eyes: conjunctivae/corneas clear. PERRL, EOM's intact. Fundi benign. Neck: no adenopathy, no carotid bruit, no JVD, supple, symmetrical, trachea midline and thyroid not enlarged, symmetric, no tenderness/mass/nodules Resp: diminished breath sounds bibasilar Cardio: regular rate and rhythm, S1, S2 normal, no murmur, click, rub or gallop GI: soft, non-tender; bowel sounds normal; no masses,  no organomegaly Extremities: swelling and post op right groin/leg Lymph nodes: Cervical, supraclavicular, and axillary nodes normal. neuro- pt is jerking:  has left upper arm AVF with good thrill and bruit  Assessment/Plan: 67 year old WM with ESRD, DM, HTN,  copd and former tobacco use.  He had claudication, underwent artherectomy of LE yest complicated by hematoma and pseudoaneurysm req operative repair 1 PAD- s/p artherectomy c/b hemotoma and pseudoaneurym req operative repair late last night.  Per VVS 2 ESRD: normally TTS via AVF- HD today on schedule- no heparin 3 Hypertension: high this AM- maybe due to transfusion - moderate volume removal as able with HD.  NO BP meds noted on OP list 4. Anemia of ESRD: gets epo and venofer with HD- right now ABLA- s/p transfusion yest- give dose of ESA while here 5. Metabolic Bone Disease: continue home phoslo, sensipar and hectorol    Treyton Slimp A 02/12/2017, 12:05 PM

## 2017-02-12 NOTE — Progress Notes (Addendum)
Progress Note  Patient Name: [name] Date of Encounter: 02/12/2017  Primary Cardiologist: Gwenlyn Found  Subjective   No chest pain or dyspnea. Right groin is sore.   Inpatient Medications    Scheduled Meds: . aspirin EC  81 mg Oral Daily  . atorvastatin  80 mg Oral QHS  . cinacalcet  30 mg Oral QPM  . clopidogrel  75 mg Oral Q breakfast  . feeding supplement (PRO-STAT SUGAR FREE 64)  30 mL Oral BID  . fentaNYL      . insulin glargine  36 Units Subcutaneous QHS  . levothyroxine  125 mcg Oral QAC breakfast  . loratadine  10 mg Oral Daily  . nortriptyline  50 mg Oral QHS  . risperiDONE  0.5 mg Oral Daily  . traZODone  50 mg Oral Daily   Continuous Infusions: . sodium chloride Stopped (02/12/17 0100)  . sodium chloride 10 mL/hr at 02/12/17 0800  . sodium chloride    . cefUROXime (ZINACEF)  IV Stopped (02/12/17 0157)   PRN Meds: sodium chloride, acetaminophen, budesonide, hydrALAZINE, HYDROmorphone (DILAUDID) injection, ondansetron (ZOFRAN) IV, oxyCODONE-acetaminophen   Vital Signs    Vitals:   02/12/17 0600 02/12/17 0630 02/12/17 0700 02/12/17 0800  BP: (!) 153/71 126/80 (!) 129/59 (!) 154/71  Pulse: 75 80 76 80  Resp: 19 16 (!) 0 (!) 30  Temp:   97.5 F (36.4 C)   TempSrc:   Axillary   SpO2: 100% 100% 96% 98%  Weight:      Height:        Intake/Output Summary (Last 24 hours) at 02/12/17 0908 Last data filed at 02/12/17 0800  Gross per 24 hour  Intake          2673.16 ml  Output              805 ml  Net          1868.16 ml   Filed Weights   02/12/17 0000 02/12/17 0100 02/12/17 0311  Weight: 236 lb 12.4 oz (107.4 kg) 236 lb 12.4 oz (107.4 kg) 236 lb 12.4 oz (107.4 kg)    Telemetry    Sinus - Personally Reviewed  ECG      Physical Exam   General: Well developed, well nourished, NAD  HEENT: OP clear, mucus membranes moist  SKIN: warm, dry. No rashes. Neuro: No focal deficits  Musculoskeletal: Muscle strength 5/5 all ext  Psychiatric: Mood and  affect normal  Neck: No JVD, no carotid bruits, no thyromegaly, no lymphadenopathy.  Lungs:Clear bilaterally, no wheezes, rhonci, crackles Cardiovascular: Regular rate and rhythm. No murmurs, gallops or rubs. Abdomen:Soft. Bowel sounds present. Non-tender.  Extremities: No lower extremity edema. Right groin with ecchymosis, JP drain in place.   Labs    Chemistry Recent Labs Lab 02/08/17 1403 02/11/17 2200 02/12/17 0416  NA 134 139 136  K 4.1 3.8 4.6  CL 85*  --  100*  CO2 29  --  23  GLUCOSE 198* 146* 174*  BUN 67*  --  105*  CREATININE 6.72*  --  8.95*  CALCIUM 9.3  --  8.1*  GFRNONAA 8*  --  5*  GFRAA 9*  --  6*  ANIONGAP  --   --  13     Hematology Recent Labs Lab 02/08/17 1409 02/11/17 2200 02/11/17 2321 02/12/17 0416  WBC 6.6  --  7.7 6.9  RBC 3.64*  --  2.84* 2.85*  HGB 11.5* 6.1* 8.9* 8.7*  HCT 34.3* 18.0*  26.6* 26.5*  MCV 94  --  93.7 93.0  MCH 31.6  --  31.3 30.5  MCHC 33.5  --  33.5 32.8  RDW 16.7*  --  16.2* 17.1*  PLT 208  --  169 176    Cardiac EnzymesNo results for input(s): TROPONINI in the last 168 hours. No results for input(s): TROPIPOC in the last 168 hours.   BNPNo results for input(s): BNP, PROBNP in the last 168 hours.   DDimer No results for input(s): DDIMER in the last 168 hours.   Radiology    Dg Chest Port 1 View  Result Date: 02/12/2017 CLINICAL DATA:  Central line placement.  Initial encounter. EXAM: PORTABLE CHEST 1 VIEW COMPARISON:  Chest radiograph performed 02/08/2017, and CT of the chest performed 08/15/2016 FINDINGS: The right IJ line is noted ending about the mid SVC. The lungs are well-aerated and clear. There is no evidence of focal opacification, pleural effusion or pneumothorax. The cardiomediastinal silhouette is mildly enlarged. Prominence of the hila reflects vasculature, on correlation with prior CT. No acute osseous abnormalities are seen. IMPRESSION: 1. Right IJ line noted ending about the mid SVC. 2. Lungs grossly  clear.  Mild cardiomegaly. Electronically Signed   By: Garald Balding M.D.   On: 02/12/2017 00:11    Patient Profile     66 y.o. male with history of ESRD on HD, former tobacco abuse, DM, HLD and PAD who was admitted 02/11/17 following a PV procedure. He has had a non-healing left heel ulcer. He underwent PV procedure with Dr. Gwenlyn Found 02/11/17 with directional atherectomy/drug eluting balloon angioplasty of the left SFA and left above knee popliteal artery. The procedure was complicated by a large right groin hematoma  Assessment & Plan    1. PAD with critical limb ischemia: He is stable this am post PV procedure with directional atherectomy/drug eluting balloon angioplasty of the left SFA and left above knee popliteal artery. The procedure was complicated by a large right groin hematoma. Dr. [name] took him to the OR last night and evacuated the hematoma and repaired the femoral artery/SFA/Profunda. He was anemic last night and was given blood in the OR. H/H stable this am. Will continue ASA and Plavix. This is ok with Dr. [name].   2. Acute blood loss anemia: H/H fell to 6.1 secondary to large hematoma. Blood given in the OR. Bleeding source stopped with arterial repair. H/H stable this am. Will repeat CBC tomorrow.   3. ESRD: Will plan HD today.   Signed, Lauree Chandler, MD  02/12/2017, 9:08 AM

## 2017-02-12 NOTE — Progress Notes (Addendum)
  Progress Note    02/12/2017 9:17 AM 1 Day Post-Op  Subjective:  Says he's shaking, otherwise, no complaints  Afebrile HR 70's-80's NSR 893'Y-101'B systolic 510% RA  Vitals:   02/12/17 0700 02/12/17 0800  BP: (!) 129/59 (!) 154/71  Pulse: 76 80  Resp: (!) 0 (!) 30  Temp: 97.5 F (36.4 C)     Physical Exam: Lungs:  Non labored Incisions:  Right groin with two JP drains in place; will remove bandage over incision tomorrow   CBC    Component Value Date/Time   WBC 6.9 02/12/2017 0416   RBC 2.85 (L) 02/12/2017 0416   HGB 8.7 (L) 02/12/2017 0416   HGB 11.5 (L) 02/08/2017 1409   HCT 26.5 (L) 02/12/2017 0416   HCT 34.3 (L) 02/08/2017 1409   PLT 176 02/12/2017 0416   PLT 208 02/08/2017 1409   MCV 93.0 02/12/2017 0416   MCV 94 02/08/2017 1409   MCH 30.5 02/12/2017 0416   MCHC 32.8 02/12/2017 0416   RDW 17.1 (H) 02/12/2017 0416   RDW 16.7 (H) 02/08/2017 1409   LYMPHSABS 2.1 02/08/2017 1409   MONOABS 0.7 06/18/2016 1622   EOSABS 0.2 02/08/2017 1409   BASOSABS 0.0 02/08/2017 1409    BMET    Component Value Date/Time   NA 136 02/12/2017 0416   NA 134 02/08/2017 1403   K 4.6 02/12/2017 0416   CL 100 (L) 02/12/2017 0416   CO2 23 02/12/2017 0416   GLUCOSE 174 (H) 02/12/2017 0416   BUN 105 (H) 02/12/2017 0416   BUN 67 (H) 02/08/2017 1403   CREATININE 8.95 (H) 02/12/2017 0416   CALCIUM 8.1 (L) 02/12/2017 0416   GFRNONAA 5 (L) 02/12/2017 0416   GFRAA 6 (L) 02/12/2017 0416    INR    Component Value Date/Time   INR 1.1 02/08/2017 1403     Intake/Output Summary (Last 24 hours) at 02/12/17 0917 Last data filed at 02/12/17 0800  Gross per 24 hour  Intake          2673.16 ml  Output              805 ml  Net          1868.16 ml     Assessment:  66 y.o. male is s/p:  Evacuation of hematoma right groin, repair of right femoral pseudoaneurysm  1 Day Post-Op  Plan: -pt extubated and doing well this morning -skin on right thigh esp medially will need to be  watched closely -pt is at high risk for wound infection and may need additional procedures in the future-will continue to monitor progress.  Keep dry gauze in groin to wick moisture to help prevent infection. -JP #1 with 60cc out and JP #2 with 45 cc out.  Will keep at least another day -okay to restart Plavix/aspirin as hgb is stable    Leontine Locket, PA-C [facility] [phone] 02/12/2017 9:17 AM   Agree with above.  Groin incision will be tenuous.  High risk for wound problems. Possibly drains out tomorrow  [name], MD [facility] [place] Office: [phone] Pager: [phone]

## 2017-02-12 NOTE — Anesthesia Postprocedure Evaluation (Signed)
Anesthesia Post Note  Patient: MARLEE TRENTMAN  Procedure(s) Performed: Procedure(s) (LRB): EVACUATION HEMATOMA RIGHT GROIN, Repair of Right Pseudo-anerysm. (Right)     Patient location during evaluation: PACU Anesthesia Type: General Level of consciousness: awake and alert Pain management: pain level controlled Vital Signs Assessment: post-procedure vital signs reviewed and stable Respiratory status: spontaneous breathing, nonlabored ventilation, respiratory function stable and patient connected to nasal cannula oxygen Cardiovascular status: blood pressure returned to baseline and stable Postop Assessment: no signs of nausea or vomiting Anesthetic complications: no    Last Vitals:  Vitals:   02/12/17 0045 02/12/17 0100  BP:  130/68  Pulse: 72 71  Resp: 14 12  Temp:  36.5 C    Last Pain:  Vitals:   02/12/17 0134  TempSrc:   PainSc: Asleep                 Effie Berkshire

## 2017-02-12 NOTE — Evaluation (Signed)
Occupational Therapy Evaluation Patient Details Name: [name] MRN: [id number] DOB: 1951-07-09 Today's Date: 02/12/2017    History of Present Illness [name] is a 66 y.o. male,s/p left leg atherectomy left SFA via right femoral approach by Dr Gwenlyn Found on 7/2.  Sheath was pulled and pt subsequently formed large right groin hematoma which continued to expand.  Had to go back for a right groin hematoma evacuation.    Other medical problems include ESRD, DM, hypertension, neuropathy, COPD which have been stable..  Procedure was done for left foot wound.   Clinical Impression   Pt admitted with above. He demonstrates the below listed deficits and will benefit from continued OT to maximize safety and independence with BADLs.  Pt presents to OT with pain, impaired balance, UE weakness clawing of Lt hand, generalized weakness and decreased activity tolerance.  Pt has been at [facility] since 1/18, per his report, and plans to return there for rehab.  He currently requires min - max A for ADLs and min A +2 for functional transfers.  VSS.  Will follow acutely.       Follow Up Recommendations  SNF    Equipment Recommendations  None recommended by OT    Recommendations for Other Services       Precautions / Restrictions Precautions Precautions: Fall Precaution Comments: 2 JP drains.  Also with chronic wound Lt heel  Restrictions Weight Bearing Restrictions: No      Mobility Bed Mobility Overal bed mobility: Needs Assistance Bed Mobility: Supine to Sit     Supine to sit: Mod assist;+2 for physical assistance     General bed mobility comments: Needed assist for scooting to EOB with use of pad and for elevation of trunk due to c/o burning in right groin.   Transfers Overall transfer level: Needs assistance Equipment used: Rolling walker (2 wheeled) Transfers: Sit to/from [facility] Sit to Stand: Min assist;+2 physical assistance;From elevated surface Stand pivot  transfers: Min assist;+2 physical assistance;From elevated surface       General transfer comment: Pt able to stand with cues for technique and was able to pivot to chair with use of RW.  Cued to keep weight off left heel.      Balance Overall balance assessment: Needs assistance Sitting-balance support: No upper extremity supported;Feet supported Sitting balance-Leahy Scale: Fair     Standing balance support: Bilateral upper extremity supported;During functional activity Standing balance-Leahy Scale: Poor Standing balance comment: relies on RW for balance.                            ADL either performed or assessed with clinical judgement   ADL Overall ADL's : Needs assistance/impaired Eating/Feeding: Modified independent;Bed level;Sitting   Grooming: Wash/dry hands;Wash/dry face;Oral care;Set up;Sitting;Bed level   Upper Body Bathing: Minimal assistance;Sitting;Bed level   Lower Body Bathing: Maximal assistance;Sit to/from stand   Upper Body Dressing : Minimal assistance;Sitting   Lower Body Dressing: Total assistance;Sit to/from stand   Toilet Transfer: Minimal assistance;+2 for physical assistance;Stand-pivot;BSC   Toileting- Clothing Manipulation and Hygiene: Total assistance;Sit to/from stand       Functional mobility during ADLs: Minimal assistance;+2 for physical assistance;Rolling walker General ADL Comments: Pt limited by pain, decreased balance and generalized weakness      Vision         Perception     Praxis      Pertinent Vitals/Pain Pain Assessment: Faces Faces Pain Scale: Hurts whole  lot Pain Location: right groiin Pain Descriptors / Indicators: Aching;Grimacing;Guarding;Operative site guarding Pain Intervention(s): Monitored during session;Limited activity within patient's tolerance;Patient requesting pain meds-RN notified     Hand Dominance Right   Extremity/Trunk Assessment Upper Extremity Assessment Upper Extremity  Assessment: RUE deficits/detail;LUE deficits/detail;Generalized weakness RUE Deficits / Details: Grossly 4/5  LUE Deficits / Details: Pt with clawing of Lt hand (ape hand appearance with wasting of thenar eminance.  More noticeable clawing of digits 4 and 5 compared to 2 and 3.   Grip 3+/5.  He reports impaired sensation Lt hand.  AV graft Lt upper arm  LUE Sensation: decreased light touch;history of peripheral neuropathy LUE Coordination: decreased fine motor   Lower Extremity Assessment Lower Extremity Assessment: Defer to PT evaluation RLE Deficits / Details: grossly 3-/5 LLE Deficits / Details: grossly 3-/5   Cervical / Trunk Assessment Cervical / Trunk Assessment: Normal   Communication Communication Communication: No difficulties   Cognition Arousal/Alertness: Awake/alert Behavior During Therapy: WFL for tasks assessed/performed Overall Cognitive Status: Within Functional Limits for tasks assessed                                     General Comments  VSS    Exercises     Shoulder Instructions      Home Living Family/patient expects to be discharged to:: Skilled nursing facility                                 Additional Comments: Pt has been in SNF since 1/18, per his report       Prior Functioning/Environment Level of Independence: Needs assistance  Gait / Transfers Assistance Needed: Independent with transfers to wheelchair per pt ADL's / Homemaking Assistance Needed: Pt reports he performs most of his ADLs with set up to min A             OT Problem List: Decreased strength;Decreased activity tolerance;Impaired balance (sitting and/or standing);Decreased coordination;Decreased safety awareness;Decreased knowledge of use of DME or AE;Pain;Impaired UE functional use      OT Treatment/Interventions: Self-care/ADL training;DME and/or AE instruction;Therapeutic activities;Patient/family education;Balance training    OT Goals(Current  goals can be found in the care plan section) Acute Rehab OT Goals Patient Stated Goal: to get better OT Goal Formulation: With patient Time For Goal Achievement: 02/26/17 Potential to Achieve Goals: Good ADL Goals Pt Will Perform Lower Body Bathing: with min assist;sit to/from stand Pt Will Transfer to Toilet: with min assist;ambulating;regular height toilet;bedside commode;grab bars Pt Will Perform Toileting - Clothing Manipulation and hygiene: sit to/from stand;with min assist  OT Frequency: Min 2X/week   Barriers to D/C: Decreased caregiver support          Co-evaluation PT/OT/SLP Co-Evaluation/Treatment: Yes Reason for Co-Treatment: Complexity of the patient's impairments (multi-system involvement);For patient/therapist safety PT goals addressed during session: Mobility/safety with mobility OT goals addressed during session: ADL's and self-care      AM-PAC PT "6 Clicks" Daily Activity     Outcome Measure Help from another person eating meals?: None Help from another person taking care of personal grooming?: A Little Help from another person toileting, which includes using toliet, bedpan, or urinal?: A Lot Help from another person bathing (including washing, rinsing, drying)?: A Lot Help from another person to put on and taking off regular upper body clothing?: A Lot Help from another person to  put on and taking off regular lower body clothing?: Total 6 Click Score: 14   End of Session Equipment Utilized During Treatment: Rolling walker;Gait belt Nurse Communication: Mobility status;Patient requests pain meds  Activity Tolerance: Patient limited by pain Patient left: in chair;with call bell/phone within reach;with chair alarm set  OT Visit Diagnosis: Unsteadiness on feet (R26.81);Pain Pain - Right/Left: Right Pain - part of body: Leg                Time: 5331-7409 OT Time Calculation (min): 30 min Charges:  OT General Charges $OT Visit: 1 Procedure OT Evaluation $OT  Eval Moderate Complexity: 1 Procedure G-Codes:     Lucille Passy, OTR/L [phone]   Lucille Passy M 02/12/2017, 12:38 PM

## 2017-02-12 NOTE — Evaluation (Signed)
Physical Therapy Evaluation Patient Details Name: [name] MRN: [id number] DOB: 1951-03-06 Today's Date: 02/12/2017   History of Present Illness  [name] is a 66 y.o. male,s/p left leg atherectomy left SFA via right femoral approach by Dr Gwenlyn Found on 7/2.  Sheath was pulled and pt subsequently formed large right groin hematoma which continued to expand.  Had to go back for a right groin hematoma evacuation.    Other medical problems include ESRD, DM, hypertension, neuropathy, COPD which have been stable..  Procedure was done for left foot wound.  Clinical Impression  Pt admitted with above diagnosis. Pt currently with functional limitations due to the deficits listed below (see PT Problem List). Pt needed +2 min assist for safety with transfers.  Should do well with SNF for therapy and progress. Will follow acutely.  VSS with session today.  Pt will benefit from skilled PT to increase their independence and safety with mobility to allow discharge to the venue listed below.      Follow Up Recommendations SNF;Supervision/Assistance - 24 hour    Equipment Recommendations  Other (comment) (TBA)    Recommendations for Other Services       Precautions / Restrictions Precautions Precautions: Fall Precaution Comments: 2 JP drains Restrictions Weight Bearing Restrictions: No      Mobility  Bed Mobility Overal bed mobility: Needs Assistance Bed Mobility: Supine to Sit     Supine to sit: Mod assist;+2 for physical assistance     General bed mobility comments: Needed assist for scooting to EOB with use of pad and for elevation of trunk due to c/o burning in right groin.   Transfers Overall transfer level: Needs assistance Equipment used: Rolling walker (2 wheeled) Transfers: Sit to/from [facility] Sit to Stand: Min assist;+2 physical assistance;From elevated surface Stand pivot transfers: Min assist;+2 physical assistance;From elevated surface        General transfer comment: Pt able to stand with cues for technique and was able to pivot to chair with use of RW.  Cued to keep weight off left heel.    Ambulation/Gait                Stairs            Wheelchair Mobility    Modified Rankin (Stroke Patients Only)       Balance Overall balance assessment: Needs assistance Sitting-balance support: No upper extremity supported;Feet supported Sitting balance-Leahy Scale: Fair     Standing balance support: Bilateral upper extremity supported;During functional activity Standing balance-Leahy Scale: Poor Standing balance comment: relies on RW for balance.                              Pertinent Vitals/Pain Pain Assessment: Faces Faces Pain Scale: Hurts whole lot Pain Location: right groiin Pain Descriptors / Indicators: Aching;Grimacing;Guarding;Operative site guarding Pain Intervention(s): Limited activity within patient's tolerance;Monitored during session;Repositioned;Patient requesting pain meds-RN notified    Home Living Family/patient expects to be discharged to:: Skilled nursing facility                      Prior Function Level of Independence: Needs assistance   Gait / Transfers Assistance Needed: Independent with transfers to wheelchair per pt  ADL's / Homemaking Assistance Needed: assist at times        Hand Dominance        Extremity/Trunk Assessment   Upper Extremity Assessment Upper Extremity Assessment:  Defer to OT evaluation    Lower Extremity Assessment Lower Extremity Assessment: RLE deficits/detail;LLE deficits/detail RLE Deficits / Details: grossly 3-/5 LLE Deficits / Details: grossly 3-/5    Cervical / Trunk Assessment Cervical / Trunk Assessment: Normal  Communication   Communication: No difficulties  Cognition Arousal/Alertness: Awake/alert Behavior During Therapy: WFL for tasks assessed/performed Overall Cognitive Status: Within Functional Limits for  tasks assessed                                        General Comments      Exercises     Assessment/Plan    PT Assessment Patient needs continued PT services  PT Problem List Decreased strength;Decreased range of motion;Decreased activity tolerance;Decreased balance;Decreased mobility;Decreased knowledge of use of DME;Decreased safety awareness;Decreased knowledge of precautions;Pain       PT Treatment Interventions DME instruction;Gait training;Functional mobility training;Therapeutic activities;Therapeutic exercise;Balance training;Patient/family education;Wheelchair mobility training    PT Goals (Current goals can be found in the Care Plan section)  Acute Rehab PT Goals Patient Stated Goal: to get better PT Goal Formulation: With patient Time For Goal Achievement: 02/26/17 Potential to Achieve Goals: Good    Frequency Min 3X/week   Barriers to discharge        Co-evaluation PT/OT/SLP Co-Evaluation/Treatment: Yes Reason for Co-Treatment: For patient/therapist safety PT goals addressed during session: Mobility/safety with mobility         AM-PAC PT "6 Clicks" Daily Activity  Outcome Measure Difficulty turning over in bed (including adjusting bedclothes, sheets and blankets)?: A Lot Difficulty moving from lying on back to sitting on the side of the bed? : A Lot Difficulty sitting down on and standing up from a chair with arms (e.g., wheelchair, bedside commode, etc,.)?: A Lot Help needed moving to and from a bed to chair (including a wheelchair)?: A Lot Help needed walking in hospital room?: Total Help needed climbing 3-5 steps with a railing? : Total 6 Click Score: 10    End of Session Equipment Utilized During Treatment: Gait belt Activity Tolerance: Patient limited by fatigue;Patient limited by pain Patient left: in chair;with call bell/phone within reach;with chair alarm set Nurse Communication: Mobility status;Patient requests pain meds PT  Visit Diagnosis: Unsteadiness on feet (R26.81);Muscle weakness (generalized) (M62.81);Pain Pain - Right/Left: Right Pain - part of body: Leg    Time: 1125-1146 PT Time Calculation (min) (ACUTE ONLY): 21 min   Charges:   PT Evaluation $PT Eval Moderate Complexity: 1 Procedure     PT G Codes:        [name],PT Acute Rehabilitation [phone] (pager)   [name] 02/12/2017, 12:27 PM

## 2017-02-12 NOTE — Plan of Care (Signed)
Problem: Tissue Perfusion: Goal: Risk factors for ineffective tissue perfusion will decrease Outcome: Progressing Doppler bilat lower extremity pulses

## 2017-02-12 NOTE — Progress Notes (Signed)
02/12/2017 1330 PA paged and made aware of pt. Elevated CBG. PA to place SSI orders. Will continue to closely monitor patient.  Mae Cianci, Arville Lime

## 2017-02-12 NOTE — Care Management Note (Signed)
Case Management Note Clayton Gibbons RN, BSN Unit 2W-Case Manager-- [place] coverage [phone]  Patient Details  Name: [name] MRN: [id number] Date of Birth: 06-26-1951  Subjective/Objective:  Pt admitted s/p:  Evacuation of hematoma right groin, repair of right femoral pseudoaneurysm  on 7/2                 Action/Plan: PTA pt lived at home with wife- CM to follow for d/c needs  Expected Discharge Date:  02/18/17               Expected Discharge Plan:  [place]  In-House Referral:     Discharge planning Services  CM Consult  Post Acute Care Choice:    Choice offered to:     DME Arranged:    DME Agency:     HH Arranged:    [place] Agency:     Status of Service:  In process, will continue to follow  If discussed at Long Length of Stay Meetings, dates discussed:    Discharge Disposition:   Additional Comments:  Dawayne Patricia, RN 02/12/2017, 12:14 PM

## 2017-02-12 NOTE — Procedures (Signed)
Patient was seen on dialysis and the procedure was supervised.  BFR 400  Via AVF BP is  102/67.   Patient appears to be tolerating treatment well  Charonda Hefter A 02/12/2017

## 2017-02-13 LAB — GLUCOSE, CAPILLARY
GLUCOSE-CAPILLARY: 142 mg/dL — AB (ref 65–99)
GLUCOSE-CAPILLARY: 270 mg/dL — AB (ref 65–99)
Glucose-Capillary: 187 mg/dL — ABNORMAL HIGH (ref 65–99)
Glucose-Capillary: 242 mg/dL — ABNORMAL HIGH (ref 65–99)

## 2017-02-13 LAB — CBC
HEMATOCRIT: 24.6 % — AB (ref 39.0–52.0)
HEMOGLOBIN: 8.2 g/dL — AB (ref 13.0–17.0)
MCH: 31.4 pg (ref 26.0–34.0)
MCHC: 33.3 g/dL (ref 30.0–36.0)
MCV: 94.3 fL (ref 78.0–100.0)
Platelets: 184 10*3/uL (ref 150–400)
RBC: 2.61 MIL/uL — AB (ref 4.22–5.81)
RDW: 17.6 % — ABNORMAL HIGH (ref 11.5–15.5)
WBC: 5.6 10*3/uL (ref 4.0–10.5)

## 2017-02-13 LAB — BASIC METABOLIC PANEL
ANION GAP: 10 (ref 5–15)
BUN: 40 mg/dL — ABNORMAL HIGH (ref 6–20)
CALCIUM: 8.4 mg/dL — AB (ref 8.9–10.3)
CO2: 29 mmol/L (ref 22–32)
CREATININE: 5.6 mg/dL — AB (ref 0.61–1.24)
Chloride: 93 mmol/L — ABNORMAL LOW (ref 101–111)
GFR calc Af Amer: 11 mL/min — ABNORMAL LOW (ref >60)
GFR calc non Af Amer: 10 mL/min — ABNORMAL LOW (ref >60)
Glucose, Bld: 173 mg/dL — ABNORMAL HIGH (ref 65–99)
Potassium: 3.7 mmol/L (ref 3.5–5.1)
SODIUM: 132 mmol/L — AB (ref 135–145)

## 2017-02-13 NOTE — Progress Notes (Signed)
   VASCULAR SURGERY ASSESSMENT & PLAN:   2 Days Post-Op s/p: Repair of right deep femoral artery and right superficial femoral artery  Discontinue JP #2 which has not drained.  Discontinue central line.  Okay to transfer to 2 W. from a vascular surgery standpoint.  Okay to mobilize.  SUBJECTIVE:   No specific complaints.  PHYSICAL EXAM:   Vitals:   02/13/17 0400 02/13/17 0500 02/13/17 0600 02/13/17 0743  BP: (!) 134/91 (!) 102/55 (!) 116/57   Pulse: 61 74 73   Resp: 16 16 17    Temp: 98.1 F (36.7 C)   97.8 F (36.6 C)  TempSrc: Oral   Oral  SpO2: 100% 100% 100%   Weight:      Height:       Incision is intact. JP #1 with 70 cc of drainage yesterday. JP #2 with 20 cc of drainage yesterday. Right foot warm and well-perfused.  LABS:   Lab Results  Component Value Date   WBC 5.6 02/12/2017   HGB 8.2 (L) 02/12/2017   HCT 24.6 (L) 02/12/2017   MCV 94.3 02/12/2017   PLT 184 02/12/2017   Lab Results  Component Value Date   CREATININE 5.60 (H) 02/13/2017   Lab Results  Component Value Date   INR 1.1 02/08/2017   CBG (last 3)   Recent Labs  02/12/17 1559 02/12/17 2159 02/13/17 0740  GLUCAP 161* 218* 142*    PROBLEM LIST:    Active Problems:   PVD (peripheral vascular disease) (HCC)   Critical lower limb ischemia   PAD (peripheral artery disease) (HCC)   CURRENT MEDS:   . aspirin EC  81 mg Oral Daily  . atorvastatin  80 mg Oral QHS  . calcium acetate  1,334 mg Oral TID WC  . Chlorhexidine Gluconate Cloth  6 each Topical Daily  . cinacalcet  30 mg Oral QPM  . clopidogrel  75 mg Oral Q breakfast  . collagenase   Topical Daily  . [START ON 02/19/2017] darbepoetin (ARANESP) injection - DIALYSIS  100 mcg Intravenous Q Tue-HD  . [START ON 02/14/2017] doxercalciferol  1 mcg Intravenous Q T,Th,Sa-HD  . feeding supplement (PRO-STAT SUGAR FREE 64)  30 mL Oral BID  . insulin aspart  0-9 Units Subcutaneous TID WC  . insulin glargine  36 Units Subcutaneous  QHS  . levothyroxine  125 mcg Oral QAC breakfast  . loratadine  10 mg Oral Daily  . nortriptyline  50 mg Oral QHS  . risperiDONE  0.5 mg Oral Daily  . sodium chloride flush  10-40 mL Intracatheter Q12H  . traZODone  50 mg Oral Daily    Gae Gallop Beeper: [phone] Office: [phone] 02/13/2017

## 2017-02-13 NOTE — Progress Notes (Signed)
Progress Note  Patient Name: [name] Date of Encounter: 02/13/2017  Primary Cardiologist: Gwenlyn Found  Subjective   Patient doing well this morning no chest pain or shortness of breath. Postop day #2 left lower extremity endovascular therapy for critical limb ischemia and right groin vascular surgical procedure for hematoma, pseudoaneurysm and continued bleeding by Dr. [name].  Inpatient Medications    Scheduled Meds: . aspirin EC  81 mg Oral Daily  . atorvastatin  80 mg Oral QHS  . calcium acetate  1,334 mg Oral TID WC  . Chlorhexidine Gluconate Cloth  6 each Topical Daily  . cinacalcet  30 mg Oral QPM  . clopidogrel  75 mg Oral Q breakfast  . collagenase   Topical Daily  . [START ON 02/19/2017] darbepoetin (ARANESP) injection - DIALYSIS  100 mcg Intravenous Q Tue-HD  . [START ON 02/14/2017] doxercalciferol  1 mcg Intravenous Q T,Th,Sa-HD  . feeding supplement (PRO-STAT SUGAR FREE 64)  30 mL Oral BID  . insulin aspart  0-9 Units Subcutaneous TID WC  . insulin glargine  36 Units Subcutaneous QHS  . levothyroxine  125 mcg Oral QAC breakfast  . loratadine  10 mg Oral Daily  . nortriptyline  50 mg Oral QHS  . risperiDONE  0.5 mg Oral Daily  . sodium chloride flush  10-40 mL Intracatheter Q12H  . traZODone  50 mg Oral Daily   Continuous Infusions: . sodium chloride Stopped (02/12/17 0100)  . sodium chloride 10 mL/hr at 02/12/17 2000  . sodium chloride    . sodium chloride    . sodium chloride     PRN Meds: sodium chloride, sodium chloride, sodium chloride, acetaminophen, alteplase, budesonide, heparin, hydrALAZINE, HYDROmorphone (DILAUDID) injection, lidocaine (PF), lidocaine-prilocaine, ondansetron (ZOFRAN) IV, oxyCODONE-acetaminophen, pentafluoroprop-tetrafluoroeth, sodium chloride flush   Vital Signs    Vitals:   02/13/17 0400 02/13/17 0500 02/13/17 0600 02/13/17 0743  BP: (!) 134/91 (!) 102/55 (!) 116/57   Pulse: 61 74 73   Resp: 16 16 17    Temp: 98.1 F (36.7 C)    97.8 F (36.6 C)  TempSrc: Oral   Oral  SpO2: 100% 100% 100%   Weight:      Height:        Intake/Output Summary (Last 24 hours) at 02/13/17 0811 Last data filed at 02/13/17 0600  Gross per 24 hour  Intake              740 ml  Output             1640 ml  Net             -900 ml   Filed Weights   02/12/17 0311 02/12/17 1330 02/12/17 1820  Weight: 236 lb 12.4 oz (107.4 kg) 243 lb 9.7 oz (110.5 kg) 240 lb 4.8 oz (109 kg)    Telemetry    NSR - Personally Reviewed  ECG     Not performed today- Personally Reviewed  Physical Exam   GEN: No acute distress.   Neck: No JVD Cardiac: RRR, no murmurs, rubs, or gallops.  Respiratory: Clear to auscultation bilaterally. GI: Soft, nontender, non-distended  MS: No edema; No deformity. Neuro:  Nonfocal  Psych: Normal affect  Extremities-2+ right pedal pulses. Bandaged right common femoral area with surrounding ecchymosis and 2 Jackson-Pratt drains.   Labs    Chemistry Recent Labs Lab 02/08/17 1403  02/11/17 2200 02/12/17 0416 02/13/17 0350  NA 134  < > 139 136 132*  K 4.1  < >  3.8 4.6 3.7  CL 85*  --   --  100* 93*  CO2 29  --   --  23 29  GLUCOSE 198*  < > 146* 174* 173*  BUN 67*  --   --  105* 40*  CREATININE 6.72*  --   --  8.95* 5.60*  CALCIUM 9.3  --   --  8.1* 8.4*  GFRNONAA 8*  --   --  5* 10*  GFRAA 9*  --   --  6* 11*  ANIONGAP  --   --   --  13 10  < > = values in this interval not displayed.   Hematology Recent Labs Lab 02/12/17 0416 02/12/17 1300 02/12/17 2334  WBC 6.9 6.5 5.6  RBC 2.85* 2.85* 2.61*  HGB 8.7* 8.8* 8.2*  HCT 26.5* 26.7* 24.6*  MCV 93.0 93.7 94.3  MCH 30.5 30.9 31.4  MCHC 32.8 33.0 33.3  RDW 17.1* 17.5* 17.6*  PLT 176 194 184    Cardiac EnzymesNo results for input(s): TROPONINI in the last 168 hours. No results for input(s): TROPIPOC in the last 168 hours.   BNPNo results for input(s): BNP, PROBNP in the last 168 hours.   DDimer No results for input(s): DDIMER in the last 168  hours.   Radiology    Dg Chest Port 1 View  Result Date: 02/12/2017 CLINICAL DATA:  Central line placement.  Initial encounter. EXAM: PORTABLE CHEST 1 VIEW COMPARISON:  Chest radiograph performed 02/08/2017, and CT of the chest performed 08/15/2016 FINDINGS: The right IJ line is noted ending about the mid SVC. The lungs are well-aerated and clear. There is no evidence of focal opacification, pleural effusion or pneumothorax. The cardiomediastinal silhouette is mildly enlarged. Prominence of the hila reflects vasculature, on correlation with prior CT. No acute osseous abnormalities are seen. IMPRESSION: 1. Right IJ line noted ending about the mid SVC. 2. Lungs grossly clear.  Mild cardiomegaly. Electronically Signed   By: Garald Balding M.D.   On: 02/12/2017 00:11    Cardiac Studies   None  Patient Profile     66 y.o. male with history of diabetes, hyperlipidemia and remote tobacco abuse. He has chronic renal insufficiency on hemodialysis for the last 5 years. He does have diabetic peripheral neuropathy and has a left heel ulcer. He underwent percutaneous revascularization of his left SFA and popliteal arteries on Monday and unfortunately had a groin complication in his right common femoral Monday night requiring surgical intervention. He has remained stable since that time. He has 2 Jackson-Pratt drains in place which are minimally draining. He did undergo hemodialysis yesterday and should be dialyzed again tomorrow.  Assessment & Plan    1: Critical limb ischemia-postop day#  2 left SFA and popliteal endovascular therapy for critical limb ischemia. Unfortunately the patient did have a right groin complication requiring surgical intervention by Dr. [name]. This has remained stable. He has 2 indwelling Jackson-Pratt drains which are minimally draining and was seen by Dr. Scot Dock this morning. We will plan per Dr. Nicole Cella recommendations to transfer to telemetry. Hopefully he can be discharged in  the next 48-72 hours. He will require dialysis tomorrow. He has been seen by the wound care nurse as well.  2: Chronic renal insufficiency-on hemodialysis. He was hemodialysis dialyzed yesterday and will require hemodialysis tomorrow. Appreciate Dr. Shelva Majestic care.  Angelina Sheriff, MD  02/13/2017, 8:11 AM

## 2017-02-13 NOTE — Progress Notes (Signed)
Subjective:  Eating breakfast- HD yest- removed 1500- BP down some but rebounded  Objective Vital signs in last 24 hours: Vitals:   02/13/17 0600 02/13/17 0743 02/13/17 0800 02/13/17 0900  BP: (!) 116/57  134/68 (!) 162/78  Pulse: 73  85 83  Resp: 17  18 16   Temp:  97.8 F (36.6 C)    TempSrc:  Oral    SpO2: 100%  98% 98%  Weight:      Height:       Weight change: 6.173 kg (13 lb 9.7 oz)  Intake/Output Summary (Last 24 hours) at 02/13/17 0947 Last data filed at 02/13/17 0800  Gross per 24 hour  Intake              490 ml  Output             1660 ml  Net            -1170 ml   Dialyzes at [facility] TTS  EDW 103.5. 4 1/2 hours HD Bath 2K/2.5 calc, Dialyzer unknown, Heparin yes. Access AVF. 15 gauge, 400 BFR 8000 of epo q tx, 1 mcg of hect q tx, 36 venofer 1 time a week   Assessment/Plan: 66 year old WM with ESRD, DM, HTN, copd and former tobacco use.  He had claudication, underwent artherectomy of LE 7/2 complicated by hematoma and pseudoaneurysm req operative repair 1 PAD- s/p artherectomy c/b hemotoma and pseudoaneurym req operative repair late 7/2.  Per VVS- move to floor bed and mobilize 2 ESRD: normally TTS via AVF- HD tomorrow on schedule- no heparin 3 Hypertension: better after HD- moderate volume removal as able with HD.  NO BP meds noted on OP list 4. Anemia of ESRD: gets epo and venofer with HD- right now ABLA- s/p transfusion yest- give dose of ESA while here 5. Metabolic Bone Disease: continue home phoslo, sensipar and hectorol    [name] A    Labs: Basic Metabolic Panel:  Recent Labs Lab 02/08/17 1403  02/11/17 2200 02/12/17 0416 02/13/17 0350  NA 134  < > 139 136 132*  K 4.1  < > 3.8 4.6 3.7  CL 85*  --   --  100* 93*  CO2 29  --   --  23 29  GLUCOSE 198*  < > 146* 174* 173*  BUN 67*  --   --  105* 40*  CREATININE 6.72*  --   --  8.95* 5.60*  CALCIUM 9.3  --   --  8.1* 8.4*  < > = values in this interval not displayed. Liver Function  Tests: No results for input(s): AST, ALT, ALKPHOS, BILITOT, PROT, ALBUMIN in the last 168 hours. No results for input(s): LIPASE, AMYLASE in the last 168 hours. No results for input(s): AMMONIA in the last 168 hours. CBC:  Recent Labs Lab 02/08/17 1409  02/11/17 2321 02/12/17 0416 02/12/17 1300 02/12/17 2334  WBC 6.6  < > 7.7 6.9 6.5 5.6  NEUTROABS 3.7  --   --   --   --   --   HGB 11.5*  < > 8.9* 8.7* 8.8* 8.2*  HCT 34.3*  < > 26.6* 26.5* 26.7* 24.6*  MCV 94  --  93.7 93.0 93.7 94.3  PLT 208  < > 169 176 194 184  < > = values in this interval not displayed. Cardiac Enzymes: No results for input(s): CKTOTAL, CKMB, CKMBINDEX, TROPONINI in the last 168 hours. CBG:  Recent Labs Lab 02/12/17 0041  02/12/17 1230 02/12/17 1559 02/12/17 2159 02/13/17 0740  GLUCAP 158* 235* 161* 218* 142*    Iron Studies: No results for input(s): IRON, TIBC, TRANSFERRIN, FERRITIN in the last 72 hours. Studies/Results: Dg Chest Port 1 View  Result Date: 02/12/2017 CLINICAL DATA:  Central line placement.  Initial encounter. EXAM: PORTABLE CHEST 1 VIEW COMPARISON:  Chest radiograph performed 02/08/2017, and CT of the chest performed 08/15/2016 FINDINGS: The right IJ line is noted ending about the mid SVC. The lungs are well-aerated and clear. There is no evidence of focal opacification, pleural effusion or pneumothorax. The cardiomediastinal silhouette is mildly enlarged. Prominence of the hila reflects vasculature, on correlation with prior CT. No acute osseous abnormalities are seen. IMPRESSION: 1. Right IJ line noted ending about the mid SVC. 2. Lungs grossly clear.  Mild cardiomegaly. Electronically Signed   By: Garald Balding M.D.   On: 02/12/2017 00:11   Medications: Infusions: . sodium chloride Stopped (02/12/17 0100)  . sodium chloride 10 mL/hr at 02/12/17 2000  . sodium chloride    . sodium chloride    . sodium chloride      Scheduled Medications: . aspirin EC  81 mg Oral Daily  .  atorvastatin  80 mg Oral QHS  . calcium acetate  1,334 mg Oral TID WC  . Chlorhexidine Gluconate Cloth  6 each Topical Daily  . cinacalcet  30 mg Oral QPM  . clopidogrel  75 mg Oral Q breakfast  . collagenase   Topical Daily  . [START ON 02/19/2017] darbepoetin (ARANESP) injection - DIALYSIS  100 mcg Intravenous Q Tue-HD  . [START ON 02/14/2017] doxercalciferol  1 mcg Intravenous Q T,Th,Sa-HD  . feeding supplement (PRO-STAT SUGAR FREE 64)  30 mL Oral BID  . insulin aspart  0-9 Units Subcutaneous TID WC  . insulin glargine  36 Units Subcutaneous QHS  . levothyroxine  125 mcg Oral QAC breakfast  . loratadine  10 mg Oral Daily  . nortriptyline  50 mg Oral QHS  . risperiDONE  0.5 mg Oral Daily  . sodium chloride flush  10-40 mL Intracatheter Q12H  . traZODone  50 mg Oral Daily    have reviewed scheduled and prn medications.  Physical Exam: General: NAD Heart: RRR Lungs: mostly clear Abdomen: obese, soft, non tender Extremities: no edema Dialysis Access: AVF patent    02/13/2017,9:47 AM  LOS: 1 day

## 2017-02-14 DIAGNOSIS — D62 Acute posthemorrhagic anemia: Secondary | ICD-10-CM

## 2017-02-14 LAB — GLUCOSE, CAPILLARY
GLUCOSE-CAPILLARY: 113 mg/dL — AB (ref 65–99)
Glucose-Capillary: 101 mg/dL — ABNORMAL HIGH (ref 65–99)
Glucose-Capillary: 290 mg/dL — ABNORMAL HIGH (ref 65–99)
Glucose-Capillary: 181 mg/dL — ABNORMAL HIGH (ref 65–99)

## 2017-02-14 LAB — RENAL FUNCTION PANEL
Albumin: 3 g/dL — ABNORMAL LOW (ref 3.5–5.0)
Anion gap: 14 (ref 5–15)
BUN: 61 mg/dL — AB (ref 6–20)
CO2: 23 mmol/L (ref 22–32)
CREATININE: 7.87 mg/dL — AB (ref 0.61–1.24)
Calcium: 8.2 mg/dL — ABNORMAL LOW (ref 8.9–10.3)
Chloride: 92 mmol/L — ABNORMAL LOW (ref 101–111)
GFR calc Af Amer: 7 mL/min — ABNORMAL LOW (ref >60)
GFR calc non Af Amer: 6 mL/min — ABNORMAL LOW (ref >60)
GLUCOSE: 111 mg/dL — AB (ref 65–99)
Phosphorus: 5.7 mg/dL — ABNORMAL HIGH (ref 2.5–4.6)
Potassium: 3.5 mmol/L (ref 3.5–5.1)
Sodium: 129 mmol/L — ABNORMAL LOW (ref 135–145)

## 2017-02-14 LAB — CBC
HCT: 21.4 % — ABNORMAL LOW (ref 39.0–52.0)
Hemoglobin: 7.2 g/dL — ABNORMAL LOW (ref 13.0–17.0)
MCH: 31.4 pg (ref 26.0–34.0)
MCHC: 33.6 g/dL (ref 30.0–36.0)
MCV: 93.4 fL (ref 78.0–100.0)
PLATELETS: 154 10*3/uL (ref 150–400)
RBC: 2.29 MIL/uL — ABNORMAL LOW (ref 4.22–5.81)
RDW: 16.7 % — AB (ref 11.5–15.5)
WBC: 6.1 10*3/uL (ref 4.0–10.5)

## 2017-02-14 LAB — PREPARE RBC (CROSSMATCH)

## 2017-02-14 MED ORDER — DARBEPOETIN ALFA 100 MCG/0.5ML IJ SOSY
PREFILLED_SYRINGE | INTRAMUSCULAR | Status: AC
  Filled 2017-02-14: qty 0.5

## 2017-02-14 MED ORDER — DARBEPOETIN ALFA 100 MCG/0.5ML IJ SOSY
PREFILLED_SYRINGE | INTRAMUSCULAR | Status: AC
  Administered 2017-02-14: 100 ug via INTRAVENOUS
  Filled 2017-02-14: qty 0.5

## 2017-02-14 MED ORDER — SODIUM CHLORIDE 0.9 % IV SOLN
Freq: Once | INTRAVENOUS | Status: DC
Start: 2017-02-14 — End: 2017-02-17

## 2017-02-14 MED ORDER — DOXERCALCIFEROL 4 MCG/2ML IV SOLN
INTRAVENOUS | Status: AC
  Administered 2017-02-14: 1 ug via INTRAVENOUS
  Filled 2017-02-14: qty 2

## 2017-02-14 MED ORDER — DARBEPOETIN ALFA 100 MCG/0.5ML IJ SOSY
100.0000 ug | PREFILLED_SYRINGE | INTRAMUSCULAR | Status: DC
  Administered 2017-02-14: 100 ug via INTRAVENOUS

## 2017-02-14 NOTE — Progress Notes (Addendum)
[facility]  Subjective  - Sat up for a long time yesterday.   Objective (!) 125/54 81 98.2 F (36.8 C) (Oral) 18 100%  Intake/Output Summary (Last 24 hours) at 02/14/17 6948 Last data filed at 02/13/17 2150  Gross per 24 hour  Intake              720 ml  Output              310 ml  Net              410 ml    Right foot motor intact Right groin with ecchymosis, JP intact  Assessment/Planning:  left leg atherectomy left SFA via right femoral approach by Dr Gwenlyn Found   S/P #1 high common femoral bifurcation  #2  2 separate needle holes repaired one at the origin of the profunda, 1 approximately 4 cm distal to the superficial femoral artery origin  Right JP 30 total   COLLINS, [place] 02/14/2017 7:12 AM --  Groin ecchymosis, some skin breakdown in groin crease but overall viable so far Most likely d/c last drain tomorrow Can be d/c home from my standpoint when drain is out  [name], MD [facility]: [phone] Pager: [phone]  Laboratory Lab Results:  Recent Labs  02/12/17 1300 02/12/17 2334  WBC 6.5 5.6  HGB 8.8* 8.2*  HCT 26.7* 24.6*  PLT 194 184   BMET  Recent Labs  02/12/17 0416 02/13/17 0350  NA 136 132*  K 4.6 3.7  CL 100* 93*  CO2 23 29  GLUCOSE 174* 173*  BUN 105* 40*  CREATININE 8.95* 5.60*  CALCIUM 8.1* 8.4*    COAG Lab Results  Component Value Date   INR 1.1 02/08/2017   INR 0.94 11/27/2010   INR 0.97 08/24/2010   No results found for: PTT

## 2017-02-14 NOTE — Progress Notes (Signed)
Progress Note  Patient Name: [name] Date of Encounter: 02/14/2017  Primary Cardiologist: Gwenlyn Found  Subjective   No chest pain or dyspnea  Inpatient Medications    Scheduled Meds: . aspirin EC  81 mg Oral Daily  . atorvastatin  80 mg Oral QHS  . calcium acetate  1,334 mg Oral TID WC  . Chlorhexidine Gluconate Cloth  6 each Topical Daily  . cinacalcet  30 mg Oral QPM  . clopidogrel  75 mg Oral Q breakfast  . collagenase   Topical Daily  . darbepoetin (ARANESP) injection - DIALYSIS  100 mcg Intravenous Q Tue-HD  . doxercalciferol  1 mcg Intravenous Q T,Th,Sa-HD  . feeding supplement (PRO-STAT SUGAR FREE 64)  30 mL Oral BID  . insulin aspart  0-9 Units Subcutaneous TID WC  . insulin glargine  36 Units Subcutaneous QHS  . levothyroxine  125 mcg Oral QAC breakfast  . loratadine  10 mg Oral Daily  . nortriptyline  50 mg Oral QHS  . risperiDONE  0.5 mg Oral Daily  . sodium chloride flush  10-40 mL Intracatheter Q12H  . traZODone  50 mg Oral Daily   Continuous Infusions: . sodium chloride Stopped (02/12/17 0100)  . sodium chloride 10 mL/hr at 02/12/17 2000  . sodium chloride    . sodium chloride    . sodium chloride     PRN Meds: sodium chloride, sodium chloride, sodium chloride, acetaminophen, alteplase, budesonide, heparin, hydrALAZINE, HYDROmorphone (DILAUDID) injection, lidocaine (PF), lidocaine-prilocaine, ondansetron (ZOFRAN) IV, oxyCODONE-acetaminophen, pentafluoroprop-tetrafluoroeth, sodium chloride flush   Vital Signs    Vitals:   02/14/17 0743 02/14/17 0753 02/14/17 0800 02/14/17 0830  BP: (!) 159/83 (!) 158/80 (!) 141/69 126/68  Pulse: 86 88 89 80  Resp: 14 14    Temp: (!) 97.5 F (36.4 C) (!) 97.5 F (36.4 C)    TempSrc: Oral Oral    SpO2: 100% 100%    Weight: 240 lb 11.9 oz (109.2 kg)     Height:        Intake/Output Summary (Last 24 hours) at 02/14/17 5361 Last data filed at 02/13/17 2150  Gross per 24 hour  Intake              600 ml    Output              290 ml  Net              310 ml   Filed Weights   02/12/17 1330 02/12/17 1820 02/14/17 0743  Weight: 243 lb 9.7 oz (110.5 kg) 240 lb 4.8 oz (109 kg) 240 lb 11.9 oz (109.2 kg)    Telemetry     ECG      Physical Exam   GEN: No acute distress.   Neck: No JVD Cardiac: RRR, no murmurs, rubs, or gallops.  Respiratory: Clear to auscultation bilaterally. GI: Soft, nontender, non-distended  Ext: No LE edema. Right groin with large ecchymosis, JP drain in place Neuro:  Nonfocal  Psych: Normal affect   Labs    Chemistry Recent Labs Lab 02/12/17 0416 02/13/17 0350 02/14/17 0800  NA 136 132* 129*  K 4.6 3.7 3.5  CL 100* 93* 92*  CO2 23 29 23   GLUCOSE 174* 173* 111*  BUN 105* 40* 61*  CREATININE 8.95* 5.60* 7.87*  CALCIUM 8.1* 8.4* 8.2*  ALBUMIN  --   --  3.0*  GFRNONAA 5* 10* 6*  GFRAA 6* 11* 7*  ANIONGAP 13 10 14  Hematology Recent Labs Lab 02/12/17 1300 02/12/17 2334 02/14/17 0800  WBC 6.5 5.6 6.1  RBC 2.85* 2.61* 2.29*  HGB 8.8* 8.2* 7.2*  HCT 26.7* 24.6* 21.4*  MCV 93.7 94.3 93.4  MCH 30.9 31.4 31.4  MCHC 33.0 33.3 33.6  RDW 17.5* 17.6* 16.7*  PLT 194 184 154      Radiology    No results found.  Cardiac Studies     Patient Profile     66 y.o. male with history of ESRD on HD, former tobacco abuse, DM, HLD and PAD who was admitted 02/11/17 following a PV procedure. He has had a non-healing left heel ulcer. He underwent PV procedure with Dr. Gwenlyn Found 02/11/17 with directional atherectomy/drug eluting balloon angioplasty of the left SFA and left above knee popliteal artery. The procedure was complicated by a large right groin hematoma requiring surgical exploration and vessel closure.   Assessment & Plan    1. PAD with critical limb ischemia: Stable following directional atherectomy/drug eluting balloon angioplasty of the left SFA and left above knee popliteal artery 02/11/17. This was complicated by a groin hematoma requiring  surgical exploration and vessel closure. Groin wound followed by VVS. Discharge when JP drain out. Will continue ASA and Plavix.   2. Acute blood loss anemia: H/H fell to 6.1 prior to surgery and he was transfused. Now down to 7.1 today. He will need transfusion today. Nephrology assisting with this in dialysis. Repeat CBC in the am.   3. ESRD: HD today  Signed, Lauree Chandler, MD  02/14/2017, 9:22 AM

## 2017-02-14 NOTE — Progress Notes (Signed)
Physical Therapy Treatment Patient Details Name: [name] MRN: [id number] DOB: 08-Mar-1951 Today's Date: 02/14/2017    History of Present Illness [name] is a 66 y.o. male,s/p left leg atherectomy left SFA via right femoral approach by Dr Gwenlyn Found on 7/2.  Sheath was pulled and pt subsequently formed large right groin hematoma which continued to expand.  Had to go back for a right groin hematoma evacuation.    Other medical problems include ESRD, DM, hypertension, neuropathy, COPD which have been stable..  Procedure was done for left foot wound.    PT Comments    Pt is making progress toward mobility goals and able to ambulate ~15ft this session. Continue to progress as tolerated with anticipated d/c to SNF for further skilled PT services.     Follow Up Recommendations  SNF;Supervision/Assistance - 24 hour     Equipment Recommendations  Other (comment) (TBA)    Recommendations for Other Services       Precautions / Restrictions Precautions Precautions: Fall Precaution Comments: 2 JP drains Restrictions Weight Bearing Restrictions: No    Mobility  Bed Mobility                  Transfers Overall transfer level: Needs assistance Equipment used: Rolling walker (2 wheeled) Transfers: Sit to/from Stand Sit to Stand: Min assist         General transfer comment: assist to power up from w/c X2  Ambulation/Gait Ambulation/Gait assistance: Min assist;+2 safety/equipment (chair follow) Ambulation Distance (Feet):  (70ft then 16ft) Assistive device: Rolling walker (2 wheeled) Gait Pattern/deviations: Step-through pattern;Decreased step length - right;Decreased step length - left;Trunk flexed (ambulated on L forefoot) Gait velocity: decreased   General Gait Details: cues for trunk extension and staying on L forefoot; seated rest break required due to fatigue   Stairs            Wheelchair Mobility    Modified Rankin (Stroke Patients Only)        Balance Overall balance assessment: Needs assistance Sitting-balance support: No upper extremity supported;Feet supported Sitting balance-Leahy Scale: Fair     Standing balance support: Bilateral upper extremity supported;During functional activity Standing balance-Leahy Scale: Poor Standing balance comment: relies on RW for balance.                             Cognition Arousal/Alertness: Awake/alert Behavior During Therapy: WFL for tasks assessed/performed Overall Cognitive Status: Within Functional Limits for tasks assessed                                        Exercises      General Comments General comments (skin integrity, edema, etc.): VSS; pt encouraged to work neutral R hip position and L LE calf stretches in bed      Pertinent Vitals/Pain Pain Assessment: Faces Faces Pain Scale: Hurts even more Pain Location: right groiin Pain Descriptors / Indicators: Grimacing;Guarding;Sore;Tightness Pain Intervention(s): Limited activity within patient's tolerance;Monitored during session;Premedicated before session;Repositioned    Home Living                      Prior Function            PT Goals (current goals can now be found in the care plan section) Acute Rehab PT Goals Patient Stated Goal: to get better PT Goal Formulation:  With patient Time For Goal Achievement: 02/26/17 Potential to Achieve Goals: Good Progress towards PT goals: Progressing toward goals    Frequency    Min 3X/week      PT Plan Current plan remains appropriate    Co-evaluation              AM-PAC PT "6 Clicks" Daily Activity  Outcome Measure  Difficulty turning over in bed (including adjusting bedclothes, sheets and blankets)?: A Lot Difficulty moving from lying on back to sitting on the side of the bed? : A Lot Difficulty sitting down on and standing up from a chair with arms (e.g., wheelchair, bedside commode, etc,.)?: A Lot Help needed  moving to and from a bed to chair (including a wheelchair)?: A Lot Help needed walking in hospital room?: A Lot Help needed climbing 3-5 steps with a railing? : Total 6 Click Score: 11    End of Session Equipment Utilized During Treatment: Gait belt Activity Tolerance: Patient limited by fatigue;Patient limited by pain Patient left: in chair;with call bell/phone within reach;Other (comment);with nursing/sitter in room (pt in w/c) Nurse Communication: Mobility status PT Visit Diagnosis: Unsteadiness on feet (R26.81);Muscle weakness (generalized) (M62.81);Pain Pain - Right/Left: Right Pain - part of body: Leg     Time: 1117-3567 PT Time Calculation (min) (ACUTE ONLY): 29 min  Charges:  $Gait Training: 8-22 mins $Therapeutic Activity: 8-22 mins                    G Codes:       Earney Navy, PTA Pager: [phone]     Darliss Cheney 02/14/2017, 4:50 PM

## 2017-02-14 NOTE — Progress Notes (Signed)
Inpatient Diabetes Program Recommendations  AACE/ADA: New Consensus Statement on Inpatient Glycemic Control (2015)  Target Ranges:  Prepandial:   less than 140 mg/dL      Peak postprandial:   less than 180 mg/dL (1-2 hours)      Critically ill patients:  140 - 180 mg/dL    Review of Glycemic Control:  Results for SHON, INDELICATO (MRN [id number]) as of 02/14/2017 12:16  Ref. Range 02/12/2017 12:30 02/12/2017 15:59 02/12/2017 21:59 02/13/2017 07:40 02/13/2017 12:06 02/13/2017 16:30 02/13/2017 20:43 02/14/2017 06:29  Glucose-Capillary Latest Ref Range: 65 - 99 mg/dL 235 (H) 161 (H) 218 (H) 142 (H) 187 (H) 242 (H) 270 (H) 113 (H)   Diabetes history: Type 2 diabetes Outpatient Diabetes medications: Toujeo 36 units daily Current orders for Inpatient glycemic control: Novolog sensitive tid with meals, Lantus 36 units q HS  Inpatient Diabetes Program Recommendations:   May consider adding Novolog meal coverage 3 units tid with meals-Hold if patient eats less than 50%.  Thanks, Adah Perl, RN, BC-ADM Inpatient Diabetes Coordinator Pager [phone] (8a-5p)

## 2017-02-14 NOTE — Progress Notes (Signed)
[facility] Rounding Note   Subjective:   Seen in HD R leg very sore Still with a drain in Left heel dressed - dsg not tremoved  Objective Vital signs in last 24 hours: Vitals:   02/14/17 0743 02/14/17 0753 02/14/17 0800 02/14/17 0830  BP: (!) 159/83 (!) 158/80 (!) 141/69 126/68  Pulse: 86 88 89 80  Resp: 14 14    Temp: (!) 97.5 F (36.4 C) (!) 97.5 F (36.4 C)    TempSrc: Oral Oral    SpO2: 100% 100%    Weight: 109.2 kg (240 lb 11.9 oz)     Height:       Weight change:   Intake/Output Summary (Last 24 hours) at 02/14/17 0908 Last data filed at 02/13/17 2150  Gross per 24 hour  Intake              600 ml  Output              290 ml  Net              310 ml    Physical Exam: BP 126/68   Pulse 80   Temp (!) 97.5 F (36.4 C) (Oral)   Resp 14   Ht 6\' 4"  (1.93 m)   Wt 109.2 kg (240 lb 11.9 oz)   SpO2 100%   BMI 29.30 kg/m   Tall large framed WM Seen in HD L upper AVF cannulated for HD Lungs clear Regular rhytm Normal S1S2 No S3 Abdomen soft LLE with 2+ edema, dressing on heel not removed R groin extensive hematoma, staples R groin, drain with sanguinous drainage   Recent Labs Lab 02/12/17 0416 02/13/17 0350 02/14/17 0800  NA 136 132* 129*  K 4.6 3.7 3.5  CL 100* 93* 92*  CO2 23 29 23   GLUCOSE 174* 173* 111*  BUN 105* 40* 61*  CREATININE 8.95* 5.60* 7.87*  CALCIUM 8.1* 8.4* 8.2*  PHOS  --   --  5.7*     Recent Labs Lab 02/14/17 0800  ALBUMIN 3.0*     Recent Labs Lab 02/08/17 1409  02/11/17 2321 02/12/17 0416 02/12/17 1300 02/12/17 2334 02/14/17 0800  WBC 6.6  < > 7.7 6.9 6.5 5.6 6.1  NEUTROABS 3.7  --   --   --   --   --   --   HGB 11.5*  < > 8.9* 8.7* 8.8* 8.2* 7.2*  HCT 34.3*  < > 26.6* 26.5* 26.7* 24.6* 21.4*  MCV 94  --  93.7 93.0 93.7 94.3 93.4  PLT 208  < > 169 176 194 184 154  < > = values in this interval not displayed.   Recent Labs Lab 02/13/17 0740 02/13/17 1206 02/13/17 1630 02/13/17 2043 02/14/17 0629  GLUCAP 142*  187* 242* 270* 113*    Scheduled Medications: . aspirin EC  81 mg Oral Daily  . atorvastatin  80 mg Oral QHS  . calcium acetate  1,334 mg Oral TID WC  . Chlorhexidine Gluconate Cloth  6 each Topical Daily  . cinacalcet  30 mg Oral QPM  . clopidogrel  75 mg Oral Q breakfast  . collagenase   Topical Daily  . [START ON 02/19/2017] darbepoetin (ARANESP) injection - DIALYSIS  100 mcg Intravenous Q Tue-HD  . doxercalciferol  1 mcg Intravenous Q T,Th,Sa-HD  . feeding supplement (PRO-STAT SUGAR FREE 64)  30 mL Oral BID  . insulin aspart  0-9 Units Subcutaneous TID WC  . insulin glargine  36  Units Subcutaneous QHS  . levothyroxine  125 mcg Oral QAC breakfast  . loratadine  10 mg Oral Daily  . nortriptyline  50 mg Oral QHS  . risperiDONE  0.5 mg Oral Daily  . sodium chloride flush  10-40 mL Intracatheter Q12H  . traZODone  50 mg Oral Daily   Dialyzes at [place]  TTS   EDW 103.5.  4 1/2 hours HD Bath 2K/2.5 calc,  Dialyzer unknown,  Heparin yes.  Access AVF L upper arm 15 gauge 400 BFR 8000 of epo q tx, 1 mcg of hect q tx, 56 venofer 1 time a week   Assessment/Plan:  66 year old WM with ESRD, DM, HTN, COPD, PAD, former tobacco use. Limb threatening ischemia/heel ulcer->atherectomy of LLE L SFA Dr. Gwenlyn Found 7/2 complicated by right groin hematoma and pseudoaneurysm requiring operative repair by VVS 7/2  1. PAD- s/p atherectomy L SFA complicated by R groin hematoma/pseudoaneurysm requiring surgical repair (all on 7/2) Dr. [name] indicates probable removal of last drain tomorrow then could go home from VVS perspective.   2. ESRD: normally TTS via AVF- getting HD today on usual schedule. 6 kg over EDW. BP already dropping and likely won't get this all off. Set goal for 4 liters, use albumin for BP support.  3. HTN: NO BP meds noted on OP list. Volume way up and BP dropping w/HD. Albumin as above.  4. Anemia of ESRD: gets epo and venofer with HD. ABLA. Transfused 2 units on 02/11/17. Darbe  100 today on HD. 1 unit in HD today for Hb back down to 7.2. 5. Metabolic Bone Disease: continue home phoslo, sensipar and hectorol   Jamal Maes, MD [place] Pager 02/14/2017, 9:11 AM

## 2017-02-14 NOTE — Progress Notes (Signed)
Physical Therapy Cancellation Note   02/14/17 0833  PT Visit Information  Last PT Received On 02/14/17  Reason Eval/Treat Not Completed Patient at procedure or test/unavailablep; Pt in HD. PT will check on pt later as time allows.   History of Present Illness [name] is a 66 y.o. male,s/p left leg atherectomy left SFA via right femoral approach by Dr Gwenlyn Found on 7/2.  Sheath was pulled and pt subsequently formed large right groin hematoma which continued to expand.  Had to go back for a right groin hematoma evacuation.    Other medical problems include ESRD, DM, hypertension, neuropathy, COPD which have been stable..  Procedure was done for left foot wound.  Earney Navy, PTA Pager: [phone]

## 2017-02-14 NOTE — Procedures (Signed)
I have personally attended this patient's dialysis session.   6 kg over EDW but BP won't allow that UF Try for 4 L Hb down to 7.2 - transfuse 1 unit and give dose Aranesp 100 L AVF no issues  Jamal Maes, MD [place] Pager 02/14/2017, 9:33 AM

## 2017-02-15 DIAGNOSIS — I998 Other disorder of circulatory system: Secondary | ICD-10-CM

## 2017-02-15 LAB — TYPE AND SCREEN
ABO/RH(D): A POS
Antibody Screen: NEGATIVE
UNIT DIVISION: 0
UNIT DIVISION: 0
UNIT DIVISION: 0
Unit division: 0
Unit division: 0

## 2017-02-15 LAB — BPAM RBC
BLOOD PRODUCT EXPIRATION DATE: 201807102359
BLOOD PRODUCT EXPIRATION DATE: 201807112359
BLOOD PRODUCT EXPIRATION DATE: 201807252359
Blood Product Expiration Date: 201807102359
Blood Product Expiration Date: 201807252359
ISSUE DATE / TIME: 201807022128
ISSUE DATE / TIME: 201807022128
ISSUE DATE / TIME: 201807030837
ISSUE DATE / TIME: 201807031834
ISSUE DATE / TIME: 201807050956
UNIT TYPE AND RH: 6200
UNIT TYPE AND RH: 6200
UNIT TYPE AND RH: 9500
Unit Type and Rh: 6200
Unit Type and Rh: 9500

## 2017-02-15 LAB — GLUCOSE, CAPILLARY
GLUCOSE-CAPILLARY: 239 mg/dL — AB (ref 65–99)
GLUCOSE-CAPILLARY: 200 mg/dL — AB (ref 65–99)
Glucose-Capillary: 151 mg/dL — ABNORMAL HIGH (ref 65–99)
Glucose-Capillary: 179 mg/dL — ABNORMAL HIGH (ref 65–99)

## 2017-02-15 NOTE — Progress Notes (Signed)
Occupational Therapy Treatment Patient Details Name: [name] MRN: [id number] DOB: 1951/05/05 Today's Date: 02/15/2017    History of present illness [name] is a 66 y.o. male,s/p left leg atherectomy left SFA via right femoral approach by Dr Gwenlyn Found on 7/2.  Sheath was pulled and pt subsequently formed large right groin hematoma which continued to expand.  Had to go back for a right groin hematoma evacuation.    Other medical problems include ESRD, DM, hypertension, neuropathy, COPD which have been stable..  Procedure was done for left foot wound.   OT comments  Pt participated in adl this am.  Educated on AE but did not use this session.    Follow Up Recommendations  SNF    Equipment Recommendations  None recommended by OT    Recommendations for Other Services      Precautions / Restrictions Precautions Precautions: Fall Precaution Comments: JP drain Restrictions Other Position/Activity Restrictions: pt states he is NWB on L heel       Mobility Bed Mobility               General bed mobility comments: in w/c  Transfers   Equipment used: Rolling walker (2 wheeled)   Sit to Stand: Min assist         General transfer comment: light steadying assist. Cues for UE placement    Balance                                           ADL either performed or assessed with clinical judgement   ADL               Lower Body Bathing: Moderate assistance;Sit to/from stand                         General ADL Comments: Pt sat in w/c at sink for bathing.  He stood but was unable to release grasp from RW for adls.  States he is NWB on L heel.  Educated on reacher and sock aide but did not use this time.  Pt is able to cross LLE over R thigh for adls.  He should not use sock aide on this foot anyway due to healing sore     Vision       Perception     Praxis      Cognition Arousal/Alertness: Awake/alert Behavior During  Therapy: WFL for tasks assessed/performed Overall Cognitive Status: Within Functional Limits for tasks assessed                                          Exercises     Shoulder Instructions       General Comments      Pertinent Vitals/ Pain       Faces Pain Scale: Hurts even more Pain Location: right groiin Pain Descriptors / Indicators: Sore Pain Intervention(s): Limited activity within patient's tolerance;Monitored during session;Repositioned  Home Living                                          Prior Functioning/Environment  Frequency  Min 2X/week        Progress Toward Goals  OT Goals(current goals can now be found in the care plan section)  Progress towards OT goals: Progressing toward goals  Acute Rehab OT Goals Patient Stated Goal: to get better  Plan Discharge plan remains appropriate    Co-evaluation                 AM-PAC PT "6 Clicks" Daily Activity     Outcome Measure   Help from another person eating meals?: None Help from another person taking care of personal grooming?: A Little Help from another person toileting, which includes using toliet, bedpan, or urinal?: A Lot Help from another person bathing (including washing, rinsing, drying)?: A Lot Help from another person to put on and taking off regular upper body clothing?: A Little Help from another person to put on and taking off regular lower body clothing?: A Lot 6 Click Score: 16    End of Session    OT Visit Diagnosis: Unsteadiness on feet (R26.81);Pain Pain - Right/Left: Right Pain - part of body: Leg   Activity Tolerance Patient tolerated treatment well   Patient Left  (w/c)   Nurse Communication          Time: 3953-2023 OT Time Calculation (min): 17 min  Charges: OT General Charges $OT Visit: 1 Procedure OT Treatments $Self Care/Home Management : 8-22 mins  Lesle Chris,  OTR/L [phone] 02/15/2017   [place] 02/15/2017, 8:05 AM

## 2017-02-15 NOTE — Progress Notes (Signed)
Progress Note  Patient Name: [name] Date of Encounter: 02/15/2017  Primary Cardiologist: Gwenlyn Found  Subjective   No CP or dyspnea  Inpatient Medications    Scheduled Meds: . aspirin EC  81 mg Oral Daily  . atorvastatin  80 mg Oral QHS  . calcium acetate  1,334 mg Oral TID WC  . Chlorhexidine Gluconate Cloth  6 each Topical Daily  . cinacalcet  30 mg Oral QPM  . clopidogrel  75 mg Oral Q breakfast  . collagenase   Topical Daily  . darbepoetin (ARANESP) injection - DIALYSIS  100 mcg Intravenous Q Thu-HD  . doxercalciferol  1 mcg Intravenous Q T,Th,Sa-HD  . feeding supplement (PRO-STAT SUGAR FREE 64)  30 mL Oral BID  . insulin aspart  0-9 Units Subcutaneous TID WC  . insulin glargine  36 Units Subcutaneous QHS  . levothyroxine  125 mcg Oral QAC breakfast  . loratadine  10 mg Oral Daily  . nortriptyline  50 mg Oral QHS  . risperiDONE  0.5 mg Oral Daily  . sodium chloride flush  10-40 mL Intracatheter Q12H  . traZODone  50 mg Oral Daily   Continuous Infusions: . sodium chloride Stopped (02/12/17 0100)  . sodium chloride 10 mL/hr at 02/12/17 2000  . sodium chloride    . sodium chloride     PRN Meds: sodium chloride, acetaminophen, budesonide, hydrALAZINE, HYDROmorphone (DILAUDID) injection, ondansetron (ZOFRAN) IV, oxyCODONE-acetaminophen, sodium chloride flush   Vital Signs    Vitals:   02/14/17 1648 02/14/17 2010 02/15/17 0453 02/15/17 1017  BP: (!) 154/71 (!) 116/53 140/68 (!) 142/78  Pulse: 91 75 88 92  Resp:  18 18   Temp:  98.4 F (36.9 C) 98.1 F (36.7 C)   TempSrc:  Oral Oral   SpO2:  97% 100%   Weight:      Height:        Intake/Output Summary (Last 24 hours) at 02/15/17 1132 Last data filed at 02/15/17 2841  Gross per 24 hour  Intake              360 ml  Output             3536 ml  Net            -3176 ml   Filed Weights   02/12/17 1820 02/14/17 0743 02/14/17 1230  Weight: 240 lb 4.8 oz (109 kg) 240 lb 11.9 oz (109.2 kg) 229 lb 11.5 oz  (104.2 kg)    Telemetry    Sinus-reviewed by me  ECG      Physical Exam   General: Well developed, well nourished, NAD  HEENT: OP clear, mucus membranes moist  SKIN: warm, dry. No rashes. Neuro: No focal deficits  Musculoskeletal: Muscle strength 5/5 all ext  Psychiatric: Mood and affect normal  Neck: No JVD, no carotid bruits, no thyromegaly, no lymphadenopathy.  Lungs:Clear bilaterally, no wheezes, rhonci, crackles Cardiovascular: Regular rate and rhythm. No murmurs, gallops or rubs. Abdomen:Soft. Bowel sounds present. Non-tender.  Extremities: No lower extremity edema. Right groin wound with bandages in place    Labs    Chemistry  Recent Labs Lab 02/12/17 0416 02/13/17 0350 02/14/17 0800  NA 136 132* 129*  K 4.6 3.7 3.5  CL 100* 93* 92*  CO2 23 29 23   GLUCOSE 174* 173* 111*  BUN 105* 40* 61*  CREATININE 8.95* 5.60* 7.87*  CALCIUM 8.1* 8.4* 8.2*  ALBUMIN  --   --  3.0*  GFRNONAA 5* 10*  6*  GFRAA 6* 11* 7*  ANIONGAP 13 10 14      Hematology  Recent Labs Lab 02/12/17 1300 02/12/17 2334 02/14/17 0800  WBC 6.5 5.6 6.1  RBC 2.85* 2.61* 2.29*  HGB 8.8* 8.2* 7.2*  HCT 26.7* 24.6* 21.4*  MCV 93.7 94.3 93.4  MCH 30.9 31.4 31.4  MCHC 33.0 33.3 33.6  RDW 17.5* 17.6* 16.7*  PLT 194 184 154      Radiology    No results found.  Cardiac Studies     Patient Profile     66 y.o. male with history of ESRD on HD, former tobacco abuse, DM, HLD and PAD who was admitted 02/11/17 following a PV procedure. He has had a non-healing left heel ulcer. He underwent PV procedure with Dr. Gwenlyn Found 02/11/17 with directional atherectomy/drug eluting balloon angioplasty of the left SFA and left above knee popliteal artery. The procedure was complicated by a large right groin hematoma requiring surgical exploration and vessel closure. He has remained hospitalized for right groin wound management.   Assessment & Plan    1. PAD with critical limb ischemia: His PAD is stable post  directional atherectomy/drug eluting balloon angioplasty of the left SFA and left above knee popliteal artery on 02/11/17 by Dr. Gwenlyn Found. This was complicated by a large groin  hematoma requiring surgical exploration with arterial repair. VVS is following the groin wound. JP drain d/c'ed today. Follow wound today. Possible d/c home tomorrow if wound stops oozing. Will continue ASA and Plavix.   2. Acute blood loss anemia: H/H fell to 6.1 prior to surgery and he was transfused. Down to 7.1 on 02/14/17. He was transfused in HD. CBC will be repeated tomorrow.   3. ESRD: HD planned for Saturday.   Signed, Lauree Chandler, MD  02/15/2017, 11:32 AM

## 2017-02-15 NOTE — Progress Notes (Signed)
Pt has saturated x2 abd/gauze dressings with bright red blood today.

## 2017-02-15 NOTE — Progress Notes (Signed)
[facility] Rounding Note   Subjective/Interval history:    Drains out but still with oozing from an old JP site and Dr. [name] says has to stay until oozing stops Denies pain or SOB Sitting up in a chair  Objective Vital signs in last 24 hours: Vitals:   02/14/17 1648 02/14/17 2010 02/15/17 0453 02/15/17 1017  BP: (!) 154/71 (!) 116/53 140/68 (!) 142/78  Pulse: 91 75 88 92  Resp:  18 18   Temp:  98.4 F (36.9 C) 98.1 F (36.7 C)   TempSrc:  Oral Oral   SpO2:  97% 100%   Weight:      Height:       Weight change:   Intake/Output Summary (Last 24 hours) at 02/15/17 1121 Last data filed at 02/15/17 4332  Gross per 24 hour  Intake              360 ml  Output             3536 ml  Net            -3176 ml    Physical Exam: BP (!) 142/78   Pulse 92   Temp 98.1 F (36.7 C) (Oral)   Resp 18   Ht 6\' 4"  (1.93 m)   Wt 104.2 kg (229 lb 11.5 oz)   SpO2 100%   BMI 27.96 kg/m   Tall large framed WM Seen in HD L upper AVF loud bruit Lungs clear Regular rhythm Normal S1S2 No S3 Bruit transmitted from AVF across chest so cannot determine if murmur or not Abdomen soft LLE with 1+ edema, dressing on heel not removed R groin extensive hematoma, drains out. Dressings in place.    Recent Labs Lab 02/12/17 0416 02/13/17 0350 02/14/17 0800  NA 136 132* 129*  K 4.6 3.7 3.5  CL 100* 93* 92*  CO2 23 29 23   GLUCOSE 174* 173* 111*  BUN 105* 40* 61*  CREATININE 8.95* 5.60* 7.87*  CALCIUM 8.1* 8.4* 8.2*  PHOS  --   --  5.7*     Recent Labs Lab 02/14/17 0800  ALBUMIN 3.0*     Recent Labs Lab 02/08/17 1409  02/11/17 2321 02/12/17 0416 02/12/17 1300 02/12/17 2334 02/14/17 0800  WBC 6.6  < > 7.7 6.9 6.5 5.6 6.1  NEUTROABS 3.7  --   --   --   --   --   --   HGB 11.5*  < > 8.9* 8.7* 8.8* 8.2* 7.2*  HCT 34.3*  < > 26.6* 26.5* 26.7* 24.6* 21.4*  MCV 94  --  93.7 93.0 93.7 94.3 93.4  PLT 208  < > 169 176 194 184 154  < > = values in this interval not displayed.   Recent  Labs Lab 02/14/17 0629 02/14/17 1216 02/14/17 1615 02/14/17 2011 02/15/17 0602  GLUCAP 113* 101* 290* 181* 151*    Scheduled Medications: . aspirin EC  81 mg Oral Daily  . atorvastatin  80 mg Oral QHS  . calcium acetate  1,334 mg Oral TID WC  . Chlorhexidine Gluconate Cloth  6 each Topical Daily  . cinacalcet  30 mg Oral QPM  . clopidogrel  75 mg Oral Q breakfast  . collagenase   Topical Daily  . darbepoetin (ARANESP) injection - DIALYSIS  100 mcg Intravenous Q Thu-HD  . doxercalciferol  1 mcg Intravenous Q T,Th,Sa-HD  . feeding supplement (PRO-STAT SUGAR FREE 64)  30 mL Oral BID  . insulin aspart  0-9 Units Subcutaneous TID WC  . insulin glargine  36 Units Subcutaneous QHS  . levothyroxine  125 mcg Oral QAC breakfast  . loratadine  10 mg Oral Daily  . nortriptyline  50 mg Oral QHS  . risperiDONE  0.5 mg Oral Daily  . sodium chloride flush  10-40 mL Intracatheter Q12H  . traZODone  50 mg Oral Daily   Dialyzes at [place]  TTS   EDW 103.5.  4 1/2 hours HD Bath 2K/2.5 calc,  Dialyzer unknown,  Heparin yes.  Access AVF L upper arm 15 gauge 400 BFR 8000 of epo q tx, 1 mcg of hect q tx, 33 venofer 1 time a week   Assessment/Plan:  66 year old WM with ESRD, DM, HTN, COPD, PAD, former tobacco use. Limb threatening ischemia/heel ulcer->atherectomy of LLE L SFA Dr. Gwenlyn Found 7/2 complicated by right groin hematoma and pseudoaneurysm requiring operative repair by VVS 7/2  1. PAD- s/p atherectomy L SFA complicated by R groin hematoma/pseudoaneurysm requiring surgical repair (all on 7/2) Dr. [name] removed 2nd drain. Still oozing from prior JP site and has to stay until oozing stops per Dr. [name].   2. ESRD:  TTS via AVF- Got off fair amt fluid yesterday and left HD only about 1 kg over EDW. HD tomorrow, 1st shift, no heparin, HD in the chair per pt request.  3. HTN: NO BP meds noted on OP list. 4. Anemia of ESRD: Gets Epo and venofer with HD. ABLA. Transfused 2 units on 02/11/17.  Darbe 100 on HD 7/5 and transfused 1 unit of PRBC's for Hb 7.2. Hb not repeated today.  5. Metabolic Bone Disease: continue home phoslo, sensipar and hectorol   Jamal Maes, MD [place] [phone] Pager 02/15/2017, 11:21 AM

## 2017-02-15 NOTE — Care Management Important Message (Signed)
Important Message  Patient Details  Name: [name] MRN: [id number] Date of Birth: 1950-11-05   Medicare Important Message Given:       [name] 02/15/2017, 12:00 PM

## 2017-02-15 NOTE — Progress Notes (Addendum)
  Progress Note    02/15/2017 9:05 AM 4 Days Post-Op  Subjective:  No complaints  Afebrile VSS 100% RA  Vitals:   02/14/17 2010 02/15/17 0453  BP: (!) 116/53 140/68  Pulse: 75 88  Resp: 18 18  Temp: 98.4 F (36.9 C) 98.1 F (36.7 C)    Physical Exam: Lungs:  Non labored Incisions:  Right groin incision is clean and dry with staples in tact.  Where drain was removed is a bloody ooze.  Other drain is in tact. Skin in right groin and thigh with ecchymosis   CBC    Component Value Date/Time   WBC 6.1 02/14/2017 0800   RBC 2.29 (L) 02/14/2017 0800   HGB 7.2 (L) 02/14/2017 0800   HGB 11.5 (L) 02/08/2017 1409   HCT 21.4 (L) 02/14/2017 0800   HCT 34.3 (L) 02/08/2017 1409   PLT 154 02/14/2017 0800   PLT 208 02/08/2017 1409   MCV 93.4 02/14/2017 0800   MCV 94 02/08/2017 1409   MCH 31.4 02/14/2017 0800   MCHC 33.6 02/14/2017 0800   RDW 16.7 (H) 02/14/2017 0800   RDW 16.7 (H) 02/08/2017 1409   LYMPHSABS 2.1 02/08/2017 1409   MONOABS 0.7 06/18/2016 1622   EOSABS 0.2 02/08/2017 1409   BASOSABS 0.0 02/08/2017 1409    BMET    Component Value Date/Time   NA 129 (L) 02/14/2017 0800   NA 134 02/08/2017 1403   K 3.5 02/14/2017 0800   CL 92 (L) 02/14/2017 0800   CO2 23 02/14/2017 0800   GLUCOSE 111 (H) 02/14/2017 0800   BUN 61 (H) 02/14/2017 0800   BUN 67 (H) 02/08/2017 1403   CREATININE 7.87 (H) 02/14/2017 0800   CALCIUM 8.2 (L) 02/14/2017 0800   GFRNONAA 6 (L) 02/14/2017 0800   GFRAA 7 (L) 02/14/2017 0800    INR    Component Value Date/Time   INR 1.1 02/08/2017 1403     Intake/Output Summary (Last 24 hours) at 02/15/17 0905 Last data filed at 02/15/17 9518  Gross per 24 hour  Intake              695 ml  Output             3536 ml  Net            -2841 ml     Assessment:  66 y.o. male is s/p:  #1 high common femoral bifurcation  #2 2 separate needle holes repaired one at the origin of the profunda, 1 approximately 4 cm distal to the superficial  femoral artery origin  4 Days Post-Op  Plan: -pt wound looks okay with staples in tact.   -there is still some ooze from the site where the drain was removed and the dressing has been changed several times -minimal output from drain that remains (35cc) -DVT prophylaxis:  Continue holding Lovenox/Heparin  -float heels -ck hgb in am-received 1 PRBC yesterday   Leontine Locket, PA-C [facility] [phone] 02/15/2017 9:05 AM   Still draining some blood from old JP site.  This appears to either be venous ooze from drain site or continued drainage of hematoma.  It is not arterial in character and no hematoma at the groin incision.  Agree with holding DVT prophylaxis. Will d/c other drain today Will need to stay inpt as long as oozing persists  [name], MD [facility] [place]: [phone] Pager: [phone]

## 2017-02-15 NOTE — Clinical Social Work Note (Signed)
Clinical Social Work Assessment  Patient Details  Name: [name] MRN: [id number] Date of Birth: [birth year]  Date of referral:  02/15/17               Reason for consult:  Facility Placement, Discharge Planning                Permission sought to share information with:  Family Supports Permission granted to share information::  Yes, Verbal Permission Granted  Name::     Lennin Osmond  Agency::     Relationship::  spouse  Contact Information:  [phone]  Housing/Transportation Living arrangements for the past 2 months:  Single Family Home Source of Information:  Patient Patient Interpreter Needed:  None Criminal Activity/Legal Involvement Pertinent to Current Situation/Hospitalization:  No - Comment as needed Significant Relationships:  Adult Children, Spouse Lives with:  Spouse Do you feel safe going back to the place where you live?  Yes Need for family participation in patient care:  Yes (Comment)  Care giving concerns:  No family at bedside  Social Worker assessment / plan: Clinical Social Worker met patient at bedside to offer support and discuss discharge needs. Patient stated he has been at [facility] since January and would like to go back. Patient stated he has family support from his wife. [facility] stated they are willing to take patient back to facility  Employment status:  Retired Insurance underwriter information:  Medicare PT Recommendations:  [place] / Referral to community resources:  [place]  Patient/Family's Response to care: Patient is appreciative of CSW role in care  Patient/Family's Understanding of and Emotional Response to Diagnosis, Current Treatment, and Prognosis:  Patient is agreeable to return to [facility]  Emotional Assessment Appearance:  Appears younger than stated age Attitude/Demeanor/Rapport:  Other Affect (typically observed):  Appropriate Orientation:  Oriented to   Time, Oriented to Place, Oriented to Self, Oriented to Situation Alcohol / Substance use:  Not Applicable Psych involvement (Current and /or in the community):  No (Comment)  Discharge Needs  Concerns to be addressed:  No discharge needs identified Readmission within the last 30 days:  No Current discharge risk:  None Barriers to Discharge:  No Barriers Identified   Wende Neighbors, LCSW 02/15/2017, 12:00 PM

## 2017-02-16 LAB — CBC
HEMATOCRIT: 23.4 % — AB (ref 39.0–52.0)
HEMATOCRIT: 24 % — AB (ref 39.0–52.0)
HEMOGLOBIN: 8 g/dL — AB (ref 13.0–17.0)
Hemoglobin: 8 g/dL — ABNORMAL LOW (ref 13.0–17.0)
MCH: 30.7 pg (ref 26.0–34.0)
MCH: 31.3 pg (ref 26.0–34.0)
MCHC: 33.3 g/dL (ref 30.0–36.0)
MCHC: 34.2 g/dL (ref 30.0–36.0)
MCV: 91.4 fL (ref 78.0–100.0)
MCV: 92 fL (ref 78.0–100.0)
Platelets: 203 10*3/uL (ref 150–400)
Platelets: 208 10*3/uL (ref 150–400)
RBC: 2.61 MIL/uL — ABNORMAL LOW (ref 4.22–5.81)
RBC: 2.56 MIL/uL — AB (ref 4.22–5.81)
RDW: 16.5 % — AB (ref 11.5–15.5)
RDW: 16.6 % — ABNORMAL HIGH (ref 11.5–15.5)
WBC: 6.9 10*3/uL (ref 4.0–10.5)
WBC: 7 10*3/uL (ref 4.0–10.5)

## 2017-02-16 LAB — RENAL FUNCTION PANEL
ALBUMIN: 3.3 g/dL — AB (ref 3.5–5.0)
Anion gap: 14 (ref 5–15)
BUN: 57 mg/dL — ABNORMAL HIGH (ref 6–20)
CALCIUM: 9.1 mg/dL (ref 8.9–10.3)
CO2: 23 mmol/L (ref 22–32)
CREATININE: 7.7 mg/dL — AB (ref 0.61–1.24)
Chloride: 87 mmol/L — ABNORMAL LOW (ref 101–111)
GFR, EST AFRICAN AMERICAN: 8 mL/min — AB (ref >60)
GFR, EST NON AFRICAN AMERICAN: 6 mL/min — AB (ref >60)
Glucose, Bld: 203 mg/dL — ABNORMAL HIGH (ref 65–99)
Phosphorus: 5.6 mg/dL — ABNORMAL HIGH (ref 2.5–4.6)
Potassium: 4 mmol/L (ref 3.5–5.1)
SODIUM: 124 mmol/L — AB (ref 135–145)

## 2017-02-16 LAB — GLUCOSE, CAPILLARY
GLUCOSE-CAPILLARY: 197 mg/dL — AB (ref 65–99)
GLUCOSE-CAPILLARY: 142 mg/dL — AB (ref 65–99)
Glucose-Capillary: 250 mg/dL — ABNORMAL HIGH (ref 65–99)
Glucose-Capillary: 207 mg/dL — ABNORMAL HIGH (ref 65–99)

## 2017-02-16 MED ORDER — DOXERCALCIFEROL 4 MCG/2ML IV SOLN
INTRAVENOUS | Status: AC
  Filled 2017-02-16: qty 2

## 2017-02-16 MED ORDER — ALUM & MAG HYDROXIDE-SIMETH 200-200-20 MG/5ML PO SUSP
30.0000 mL | Freq: Four times a day (QID) | ORAL | Status: DC | PRN
  Administered 2017-02-16: 30 mL via ORAL
  Filled 2017-02-16: qty 30

## 2017-02-16 NOTE — Progress Notes (Signed)
[facility] Rounding Note   Subjective/Interval history:    Getting his HD in a recliner today Says he changed own dressing this AM "didn't want to bother the nurses" In fact says he himself has been changing dressings 4-5 times a day This AM said was blood soaked and already since 6AM blood on both dressings plus gauze he has tucked into groin   Objective Vital signs in last 24 hours: Vitals:   02/16/17 0457 02/16/17 0648 02/16/17 0705 02/16/17 0710  BP: (!) 157/76 135/69 131/71 (!) 141/71  Pulse: 94 84 84 83  Resp: 18 18 18 17   Temp: 97.8 F (36.6 C) 98.1 F (36.7 C)    TempSrc: Oral Oral    SpO2: 100% 100%    Weight:  106.5 kg (234 lb 12.6 oz)    Height:       Weight change: -2.7 kg (-5 lb 15.2 oz)  Intake/Output Summary (Last 24 hours) at 02/16/17 0730 Last data filed at 02/15/17 1823  Gross per 24 hour  Intake              600 ml  Output              300 ml  Net              300 ml    Physical Exam: BP (!) 141/71   Pulse 83   Temp 98.1 F (36.7 C) (Oral)   Resp 17   Ht 6\' 4"  (1.93 m)   Wt 106.5 kg (234 lb 12.6 oz)   SpO2 100%   BMI 28.58 kg/m   Tall large framed WM Seen in HD L upper AVF loud bruit heard across his chest Lungs clear Regular rhythm Normal S1S2 No S3 AVF cannulated for dialysis Abdomen soft, not tender LLE with 1+ edema woody bilateral, dressing on heel not removed R groin extensive hematoma, drains out.  Long dressing with blood starting to soak through and bloody gauze tucked into groin (all done by pt - 6AM - currently is 7:30 AM)   Recent Labs Lab 02/13/17 0350 02/14/17 0800 02/16/17 0553  NA 132* 129* 124*  K 3.7 3.5 4.0  CL 93* 92* 87*  CO2 29 23 23   GLUCOSE 173* 111* 203*  BUN 40* 61* 57*  CREATININE 5.60* 7.87* 7.70*  CALCIUM 8.4* 8.2* 9.1  PHOS  --  5.7* 5.6*    Recent Labs Lab 02/14/17 0800 02/16/17 0553  ALBUMIN 3.0* 3.3*    Recent Labs Lab 02/12/17 1300 02/12/17 2334 02/14/17 0800 02/15/17 2355  02/16/17 0553  WBC 6.5 5.6 6.1 7.0 6.9  HGB 8.8* 8.2* 7.2* 8.0* 8.0*  HCT 26.7* 24.6* 21.4* 23.4* 24.0*  MCV 93.7 94.3 93.4 91.4 92.0  PLT 194 184 154 208 203    Recent Labs Lab 02/15/17 0602 02/15/17 1128 02/15/17 1626 02/15/17 2113 02/16/17 0550  GLUCAP 151* 239* 200* 179* 197*    Scheduled Medications: . aspirin EC  81 mg Oral Daily  . atorvastatin  80 mg Oral QHS  . calcium acetate  1,334 mg Oral TID WC  . Chlorhexidine Gluconate Cloth  6 each Topical Daily  . cinacalcet  30 mg Oral QPM  . clopidogrel  75 mg Oral Q breakfast  . collagenase   Topical Daily  . darbepoetin (ARANESP) injection - DIALYSIS  100 mcg Intravenous Q Thu-HD  . doxercalciferol  1 mcg Intravenous Q T,Th,Sa-HD  . feeding supplement (PRO-STAT SUGAR FREE 64)  30 mL Oral BID  .  insulin aspart  0-9 Units Subcutaneous TID WC  . insulin glargine  36 Units Subcutaneous QHS  . levothyroxine  125 mcg Oral QAC breakfast  . loratadine  10 mg Oral Daily  . nortriptyline  50 mg Oral QHS  . risperiDONE  0.5 mg Oral Daily  . sodium chloride flush  10-40 mL Intracatheter Q12H  . traZODone  50 mg Oral Daily   Dialyzes [facility]  TTS   EDW 103.5.  4 1/2 hours HD Bath 2K/2.5 calc,  Dialyzer unknown,  Heparin yes.  Access AVF L upper arm 15 gauge 400 BFR 8000 of epo q tx, 1 mcg of hect q tx, 66 venofer 1 time a week   Assessment/Plan:  66 year old WM with ESRD, DM, HTN, COPD, PAD, former tobacco use. Limb threatening ischemia/heel ulcer->atherectomy of LLE L SFA Dr. Gwenlyn Found 7/2 complicated by right groin hematoma and pseudoaneurysm requiring operative repair by VVS 7/2  1. PAD- s/p atherectomy L SFA complicated by R groin hematoma/pseudoaneurysm requiring surgical repair (all on 7/2) Dr. [name] removed 2nd drain. Still oozing and has to stay until oozing stops per Dr. [name]. Pt changed own dressing at 6AM today and ooze already can be seen in the long dressing (7:30 AM), has piece of bloody gauze tucked into  the groin. Also says has been changing own dsg 5-6x/day... 2. ESRD:  TTS via AVF- on HD now. No heparin. 3 liter goal 3. Anemia of ESRD: Gets Epo and venofer with HD. ABLA. Transfused 2 units on 02/11/17. Darbe 100 on HD 7/5 and transfused 1 unit of PRBC's for Hb 7.2. Hb today 8. 4. CKD - MBD : continue home phoslo, sensipar and hectorol   Jamal Maes, MD [facility] [phone] Pager 02/16/2017, 7:30 AM

## 2017-02-16 NOTE — Procedures (Signed)
I have personally attended this patient's dialysis session.   HD in recliner No AVF issues No heparin 4K bath UF goal 3 liters  Jamal Maes, MD [facility] [phone] Pager 02/16/2017, 7:58 AM

## 2017-02-16 NOTE — Progress Notes (Signed)
Subjective:  Still having some drainage from the previous exit site of the [employer] drain.  No leg pain or chest pain.  Complains of insomnia.  Just got back from dialysis  Objective:  Vital Signs in the last 24 hours: BP (!) 116/57 (BP Location: Right Arm)   Pulse 83   Temp (!) 97.5 F (36.4 C) (Oral)   Resp (!) 21   Ht 6\' 4"  (1.93 m)   Wt 103.9 kg (229 lb 0.9 oz)   SpO2 98%   BMI 27.88 kg/m   Physical Exam: Middle-aged male in no acute distress Lungs:  Clear Cardiac:  Regular rhythm, normal S1 and S2, no S3 Extremities:  Large ecchymoses and some drainage still from surgical wound site  Intake/Output from previous day: 07/06 0701 - 07/07 0700 In: 600 [P.O.:600] Out: 300 [Urine:300]  Weight Filed Weights   02/14/17 1230 02/16/17 0648 02/16/17 1105  Weight: 104.2 kg (229 lb 11.5 oz) 106.5 kg (234 lb 12.6 oz) 103.9 kg (229 lb 0.9 oz)    Lab Results: Basic Metabolic Panel:  Recent Labs  02/14/17 0800 02/16/17 0553  NA 129* 124*  K 3.5 4.0  CL 92* 87*  CO2 23 23  GLUCOSE 111* 203*  BUN 61* 57*  CREATININE 7.87* 7.70*   CBC:  Recent Labs  02/15/17 2355 02/16/17 0553  WBC 7.0 6.9  HGB 8.0* 8.0*  HCT 23.4* 24.0*  MCV 91.4 92.0  PLT 208 203   Telemetry: Reviewed  Personally.  Normal sinus rhythm  Assessment/Plan:  1.  PAD with previous limb ischemia currently stable but had large groin hematoma that had to be surgically repaired and is still draining 2.  Acute blood loss anemia 3.  End-stage renal disease  Recommendations:  We'll need to keep in the hospital and told drainage improves according to vascular surgeons.     Kerry Hough  MD [facility]  02/16/2017, 12:49 PM

## 2017-02-16 NOTE — Plan of Care (Addendum)
Problem: Cardiovascular: Goal: Vascular access site(s) Level 0-1 will be maintained No drainage this pm from JP drain exit site, scant drainage from groin site; dry gauze replaced

## 2017-02-16 NOTE — Progress Notes (Addendum)
[facility] [place]  Subjective  - No new complaints.  Still drainaging dark bloody drainage from JP exit site.   Objective (!) (P) 94/48 (P) 79 98.1 F (36.7 C) (Oral) (P) 17 100%  Intake/Output Summary (Last 24 hours) at 02/16/17 1018 Last data filed at 02/15/17 1823  Gross per 24 hour  Intake              240 ml  Output              300 ml  Net              -60 ml    Changed dressing which was saturated from new dressing at 5 am.  Thigh softer without recurrent  hematoma  Assessment/Planning: POD # 5 #1 high common femoral bifurcation  #2 2 separate needle holes repaired one at the origin of the profunda, 1 approximately 4 cm distal to the superficial femoral artery origin  4 Days Post-Op  Currently in HD Continue dressing changes to drain site as needed Will need to stay inpt as long as oozing persists [name], [name] 02/16/2017 10:18 AM --  Old hematoma still draining Continue current care Home tomorrow to [place] center if no drainage Hemoglobin stable  [name], MD [facility] [place]: [phone] Pager: [phone]   Laboratory Lab Results:  Recent Labs  02/15/17 2355 02/16/17 0553  WBC 7.0 6.9  HGB 8.0* 8.0*  HCT 23.4* 24.0*  PLT 208 203   BMET  Recent Labs  02/14/17 0800 02/16/17 0553  NA 129* 124*  K 3.5 4.0  CL 92* 87*  CO2 23 23  GLUCOSE 111* 203*  BUN 61* 57*  CREATININE 7.87* 7.70*  CALCIUM 8.2* 9.1    COAG Lab Results  Component Value Date   INR 1.1 02/08/2017   INR 0.94 11/27/2010   INR 0.97 08/24/2010   No results found for: PTT

## 2017-02-17 DIAGNOSIS — R2681 Unsteadiness on feet: Secondary | ICD-10-CM | POA: Diagnosis not present

## 2017-02-17 DIAGNOSIS — M6281 Muscle weakness (generalized): Secondary | ICD-10-CM | POA: Diagnosis not present

## 2017-02-17 DIAGNOSIS — R2689 Other abnormalities of gait and mobility: Secondary | ICD-10-CM | POA: Diagnosis not present

## 2017-02-17 DIAGNOSIS — I70202 Unspecified atherosclerosis of native arteries of extremities, left leg: Secondary | ICD-10-CM | POA: Diagnosis not present

## 2017-02-17 DIAGNOSIS — R4182 Altered mental status, unspecified: Secondary | ICD-10-CM | POA: Diagnosis not present

## 2017-02-17 DIAGNOSIS — I120 Hypertensive chronic kidney disease with stage 5 chronic kidney disease or end stage renal disease: Secondary | ICD-10-CM | POA: Diagnosis not present

## 2017-02-17 DIAGNOSIS — N2581 Secondary hyperparathyroidism of renal origin: Secondary | ICD-10-CM | POA: Diagnosis not present

## 2017-02-17 DIAGNOSIS — E0590 Thyrotoxicosis, unspecified without thyrotoxic crisis or storm: Secondary | ICD-10-CM | POA: Diagnosis not present

## 2017-02-17 DIAGNOSIS — E1122 Type 2 diabetes mellitus with diabetic chronic kidney disease: Secondary | ICD-10-CM | POA: Diagnosis not present

## 2017-02-17 DIAGNOSIS — L84 Corns and callosities: Secondary | ICD-10-CM | POA: Diagnosis not present

## 2017-02-17 DIAGNOSIS — M79675 Pain in left toe(s): Secondary | ICD-10-CM | POA: Diagnosis not present

## 2017-02-17 DIAGNOSIS — L89620 Pressure ulcer of left heel, unstageable: Secondary | ICD-10-CM | POA: Diagnosis not present

## 2017-02-17 DIAGNOSIS — R296 Repeated falls: Secondary | ICD-10-CM | POA: Diagnosis not present

## 2017-02-17 DIAGNOSIS — D631 Anemia in chronic kidney disease: Secondary | ICD-10-CM | POA: Diagnosis not present

## 2017-02-17 DIAGNOSIS — E782 Mixed hyperlipidemia: Secondary | ICD-10-CM | POA: Diagnosis not present

## 2017-02-17 DIAGNOSIS — F5104 Psychophysiologic insomnia: Secondary | ICD-10-CM | POA: Diagnosis not present

## 2017-02-17 DIAGNOSIS — N186 End stage renal disease: Secondary | ICD-10-CM | POA: Diagnosis not present

## 2017-02-17 DIAGNOSIS — T8131XA Disruption of external operation (surgical) wound, not elsewhere classified, initial encounter: Secondary | ICD-10-CM | POA: Diagnosis not present

## 2017-02-17 DIAGNOSIS — E119 Type 2 diabetes mellitus without complications: Secondary | ICD-10-CM | POA: Diagnosis not present

## 2017-02-17 DIAGNOSIS — E785 Hyperlipidemia, unspecified: Secondary | ICD-10-CM | POA: Diagnosis not present

## 2017-02-17 DIAGNOSIS — Z79899 Other long term (current) drug therapy: Secondary | ICD-10-CM | POA: Diagnosis not present

## 2017-02-17 DIAGNOSIS — K219 Gastro-esophageal reflux disease without esophagitis: Secondary | ICD-10-CM | POA: Diagnosis not present

## 2017-02-17 DIAGNOSIS — E875 Hyperkalemia: Secondary | ICD-10-CM | POA: Diagnosis not present

## 2017-02-17 DIAGNOSIS — S91302A Unspecified open wound, left foot, initial encounter: Secondary | ICD-10-CM | POA: Diagnosis not present

## 2017-02-17 DIAGNOSIS — B351 Tinea unguium: Secondary | ICD-10-CM | POA: Diagnosis not present

## 2017-02-17 DIAGNOSIS — D509 Iron deficiency anemia, unspecified: Secondary | ICD-10-CM | POA: Diagnosis not present

## 2017-02-17 DIAGNOSIS — M25559 Pain in unspecified hip: Secondary | ICD-10-CM | POA: Diagnosis not present

## 2017-02-17 DIAGNOSIS — L89623 Pressure ulcer of left heel, stage 3: Secondary | ICD-10-CM | POA: Diagnosis not present

## 2017-02-17 DIAGNOSIS — E039 Hypothyroidism, unspecified: Secondary | ICD-10-CM | POA: Diagnosis not present

## 2017-02-17 DIAGNOSIS — J9601 Acute respiratory failure with hypoxia: Secondary | ICD-10-CM | POA: Diagnosis not present

## 2017-02-17 DIAGNOSIS — I63512 Cerebral infarction due to unspecified occlusion or stenosis of left middle cerebral artery: Secondary | ICD-10-CM | POA: Diagnosis not present

## 2017-02-17 DIAGNOSIS — I999 Unspecified disorder of circulatory system: Secondary | ICD-10-CM | POA: Diagnosis not present

## 2017-02-17 DIAGNOSIS — R278 Other lack of coordination: Secondary | ICD-10-CM | POA: Diagnosis not present

## 2017-02-17 DIAGNOSIS — Z992 Dependence on renal dialysis: Secondary | ICD-10-CM | POA: Diagnosis not present

## 2017-02-17 DIAGNOSIS — F339 Major depressive disorder, recurrent, unspecified: Secondary | ICD-10-CM | POA: Diagnosis not present

## 2017-02-17 DIAGNOSIS — N185 Chronic kidney disease, stage 5: Secondary | ICD-10-CM | POA: Diagnosis not present

## 2017-02-17 DIAGNOSIS — I2510 Atherosclerotic heart disease of native coronary artery without angina pectoris: Secondary | ICD-10-CM | POA: Diagnosis not present

## 2017-02-17 DIAGNOSIS — I739 Peripheral vascular disease, unspecified: Secondary | ICD-10-CM | POA: Diagnosis not present

## 2017-02-17 DIAGNOSIS — I998 Other disorder of circulatory system: Secondary | ICD-10-CM | POA: Diagnosis not present

## 2017-02-17 DIAGNOSIS — I10 Essential (primary) hypertension: Secondary | ICD-10-CM | POA: Diagnosis not present

## 2017-02-17 DIAGNOSIS — D518 Other vitamin B12 deficiency anemias: Secondary | ICD-10-CM | POA: Diagnosis not present

## 2017-02-17 DIAGNOSIS — F411 Generalized anxiety disorder: Secondary | ICD-10-CM | POA: Diagnosis not present

## 2017-02-17 DIAGNOSIS — E1140 Type 2 diabetes mellitus with diabetic neuropathy, unspecified: Secondary | ICD-10-CM | POA: Diagnosis not present

## 2017-02-17 DIAGNOSIS — E1142 Type 2 diabetes mellitus with diabetic polyneuropathy: Secondary | ICD-10-CM | POA: Diagnosis not present

## 2017-02-17 LAB — RENAL FUNCTION PANEL
ALBUMIN: 3 g/dL — AB (ref 3.5–5.0)
ANION GAP: 11 (ref 5–15)
BUN: 38 mg/dL — ABNORMAL HIGH (ref 6–20)
CALCIUM: 8.9 mg/dL (ref 8.9–10.3)
CO2: 28 mmol/L (ref 22–32)
Chloride: 91 mmol/L — ABNORMAL LOW (ref 101–111)
Creatinine, Ser: 5.72 mg/dL — ABNORMAL HIGH (ref 0.61–1.24)
GFR, EST AFRICAN AMERICAN: 11 mL/min — AB (ref >60)
GFR, EST NON AFRICAN AMERICAN: 9 mL/min — AB (ref >60)
Glucose, Bld: 173 mg/dL — ABNORMAL HIGH (ref 65–99)
PHOSPHORUS: 4.6 mg/dL (ref 2.5–4.6)
Potassium: 3.5 mmol/L (ref 3.5–5.1)
SODIUM: 130 mmol/L — AB (ref 135–145)

## 2017-02-17 LAB — CBC
HCT: 21.7 % — ABNORMAL LOW (ref 39.0–52.0)
HEMOGLOBIN: 7.1 g/dL — AB (ref 13.0–17.0)
MCH: 30.6 pg (ref 26.0–34.0)
MCHC: 32.7 g/dL (ref 30.0–36.0)
MCV: 93.5 fL (ref 78.0–100.0)
Platelets: 215 10*3/uL (ref 150–400)
RBC: 2.32 MIL/uL — AB (ref 4.22–5.81)
RDW: 16.7 % — ABNORMAL HIGH (ref 11.5–15.5)
WBC: 5.7 10*3/uL (ref 4.0–10.5)

## 2017-02-17 LAB — GLUCOSE, CAPILLARY
GLUCOSE-CAPILLARY: 199 mg/dL — AB (ref 65–99)
GLUCOSE-CAPILLARY: 268 mg/dL — AB (ref 65–99)

## 2017-02-17 MED ORDER — ASPIRIN 81 MG PO TBEC: 81.0000 mg | DELAYED_RELEASE_TABLET | Freq: Every day | ORAL | Status: DC

## 2017-02-17 MED ORDER — CALCIUM ACETATE (PHOS BINDER) 667 MG PO CAPS: 1334.0000 mg | ORAL_CAPSULE | Freq: Three times a day (TID) | ORAL | 10 refills | Status: DC

## 2017-02-17 MED ORDER — POLYETHYLENE GLYCOL 3350 17 G PO PACK
17.0000 g | PACK | Freq: Every day | ORAL | Status: DC | PRN
  Filled 2017-02-17: qty 1

## 2017-02-17 NOTE — Progress Notes (Addendum)
[facility] [place]  Subjective  - Doing well.   Objective (!) 142/64 94 98.4 F (36.9 C) (Oral) 18 100%  Intake/Output Summary (Last 24 hours) at 02/17/17 7588 Last data filed at 02/16/17 1105  Gross per 24 hour  Intake                0 ml  Output             2600 ml  Net            -2600 ml    No drainage at drain exit sites as of 02/16/2017 Soft groin   Assessment/Planning: left leg atherectomy left SFA via right femoral approach by Dr Gwenlyn Found   S/P #1 high common femoral bifurcation  #2 2 separate needle holes repaired one at the origin of the profunda, 1 approximately 4 cm distal to the superficial femoral artery origin   OK to discharge home may shower this evening PRN  Laurence Slate [facility] 02/17/2017 9:27 AM --  Agree with above follow up 2 weeks  [name], MD [facility] [place]: [phone] Pager: [phone]   Laboratory Lab Results:  Recent Labs  02/16/17 0553 02/17/17 0155  WBC 6.9 5.7  HGB 8.0* 7.1*  HCT 24.0* 21.7*  PLT 203 215   BMET  Recent Labs  02/16/17 0553 02/17/17 0155  NA 124* 130*  K 4.0 3.5  CL 87* 91*  CO2 23 28  GLUCOSE 203* 173*  BUN 57* 38*  CREATININE 7.70* 5.72*  CALCIUM 9.1 8.9    COAG Lab Results  Component Value Date   INR 1.1 02/08/2017   INR 0.94 11/27/2010   INR 0.97 08/24/2010   No results found for: PTT

## 2017-02-17 NOTE — Discharge Summary (Signed)
Discharge Summary    Patient ID: [name],  MRN: [id number], DOB/AGE: May 09, 1951 66 y.o.  Admit date: 02/11/2017 Discharge date: 02/17/2017  Primary Care Provider: Caryl Bis Primary Cardiologist: Dr. Gwenlyn Found   Discharge Diagnoses    Active Problems:   PVD (peripheral vascular disease) [facility])   Critical lower limb ischemia   PAD (peripheral artery disease) ([place])   History of Present Illness     [name] is a 66 y.o. male with past medical history of PAD, HTN, HLD, Type 2 DM, and ESRD (on HD) who presented to [facility] on 02/11/2017 for planned peripheral angiography.   He had previously been evaluated in the office by Dr. Gwenlyn Found on 6/29 for a non-healing left heel ulcer. Recent dopplers showed high-grade mid to distal left SFA stenosis with tibial disease, therefore peripheral angiography was recommended.  Hospital Course     Consultants: Vascular Surgery, Nephrology  Lower extremity angiography showed 50% segmental ostial/proximal left SFA, 99% calcified fairly focal mid left SFA and 90% eccentric left above-the-knee popliteal artery with 3 vessel runoff. There was 50% segmental mid right SFA stenosis with three-vessel runoff. Successful left mid SFA and above-the-knee popliteal artery Hawk 1 directional atherectomy and drug-eluting balloon angioplasty using distal protection for critical limb ischemia was performed. He was continued on PTA ASA and Plavix.   The evening following the procedure, he was noted to have an expanding hematoma below the inguinal ligament. Vascular Surgery was consulted and he went to the OR for evacuation and repair of the femoral artery/SFA/and Profunda.   The following morning, he reported mild pain. Hgb was at 6.1 when checked in the OR but improved to 8.7 on recheck. Nephrology was consulted to assist with his dialysis needs during admission.   He continued to be followed by Vascular and it was recommended he remain inpatient until his  JP drain could be removed. These were removed on 7/6 but he continued to experience mild oozing.   On 02/17/2017, he reported doing well and denied any pain from his access site. Hgb was at 7.1 and he denied any evidence of active bleeding. No drainage was noted and he was evaluated by Vascular and deemed stable for discharge from their perspective. He will follow-up with Dr. [name] in 2 weeks. A staff message has been sent to our office to arrange for follow-up with Dr. Gwenlyn Found. He will need a repeat CBC at that time. He was discharged to SNF [facility]) in stable condition.   _____________  Discharge Vitals Blood pressure (!) 142/64, pulse 94, temperature 98.4 F (36.9 C), temperature source Oral, resp. rate 18, height 6\' 4"  (1.93 m), weight 229 lb 0.9 oz (103.9 kg), SpO2 100 %.  Filed Weights   02/14/17 1230 02/16/17 0648 02/16/17 1105  Weight: 229 lb 11.5 oz (104.2 kg) 234 lb 12.6 oz (106.5 kg) 229 lb 0.9 oz (103.9 kg)    Labs & Radiologic Studies     CBC  Recent Labs  02/16/17 0553 02/17/17 0155  WBC 6.9 5.7  HGB 8.0* 7.1*  HCT 24.0* 21.7*  MCV 92.0 93.5  PLT 203 321   Basic Metabolic Panel  Recent Labs  02/16/17 0553 02/17/17 0155  NA 124* 130*  K 4.0 3.5  CL 87* 91*  CO2 23 28  GLUCOSE 203* 173*  BUN 57* 38*  CREATININE 7.70* 5.72*  CALCIUM 9.1 8.9  PHOS 5.6* 4.6   Liver Function Tests  Recent Labs  02/16/17  1194 02/17/17 0155  ALBUMIN 3.3* 3.0*   No results for input(s): LIPASE, AMYLASE in the last 72 hours. Cardiac Enzymes No results for input(s): CKTOTAL, CKMB, CKMBINDEX, TROPONINI in the last 72 hours. BNP Invalid input(s): POCBNP D-Dimer No results for input(s): DDIMER in the last 72 hours. Hemoglobin A1C No results for input(s): HGBA1C in the last 72 hours. Fasting Lipid Panel No results for input(s): CHOL, HDL, LDLCALC, TRIG, CHOLHDL, LDLDIRECT in the last 72 hours. Thyroid Function Tests No results for input(s): TSH, T4TOTAL, T3FREE,  THYROIDAB in the last 72 hours.  Invalid input(s): FREET3  Dg Chest 2 View  Result Date: 02/08/2017 CLINICAL DATA:  Preoperative chest radiograph. EXAM: CHEST  2 VIEW COMPARISON:  08/30/2016 FINDINGS: Cardiomegaly identified. There is no evidence of focal airspace disease, pulmonary edema, suspicious pulmonary nodule/mass, pleural effusion, or pneumothorax. No acute bony abnormalities are identified. IMPRESSION: Cardiomegaly without evidence of acute cardiopulmonary disease. Electronically Signed   By: Margarette Canada M.D.   On: 02/08/2017 15:49   Dg Chest Port 1 View  Result Date: 02/12/2017 CLINICAL DATA:  Central line placement.  Initial encounter. EXAM: PORTABLE CHEST 1 VIEW COMPARISON:  Chest radiograph performed 02/08/2017, and CT of the chest performed 08/15/2016 FINDINGS: The right IJ line is noted ending about the mid SVC. The lungs are well-aerated and clear. There is no evidence of focal opacification, pleural effusion or pneumothorax. The cardiomediastinal silhouette is mildly enlarged. Prominence of the hila reflects vasculature, on correlation with prior CT. No acute osseous abnormalities are seen. IMPRESSION: 1. Right IJ line noted ending about the mid SVC. 2. Lungs grossly clear.  Mild cardiomegaly. Electronically Signed   By: Garald Balding M.D.   On: 02/12/2017 00:11     Diagnostic Studies/Procedures     Lower Extremity Angiography: 02/11/2017  Angiographic Data:   1: Abdominal aorta-mild to moderate atherosclerotic changes 2: Left lower extremity-50% segmental ostial/proximal left SFA, 99% calcified fairly focal mid left SFA and 90% eccentric left above-the-knee popliteal artery with 3 vessel runoff 3: Right lower extremity-50% segmental mid right SFA stenosis with three-vessel runoff  IMPRESSION: Mr. Bracknell has high-grade mid left SFA and above in the left popliteal artery stenosis with critical limb ischemia. We'll proceed with directional atherectomy followed by drug-eluting  balloon angioplasty using distal protection  Procedure Description: Contralateral access was obtained with a crossover catheter, Glidewire, Rosen wire and a 7 French 45 cm multipurpose destination sheath. The patient received a total of 14,000 And intravenously with an ACT of 263. Total contrast administered to the patient was 215 mL. I crossed the mid left SFA and popliteal lesion with an 014 [place] core in an 018 CXI  the end hole catheter. Following this I placed a 6 mm spider distal protection device in the boot below the knee popliteal artery. I then used a short nose cone Hawk 1 directional atherectomy device performing multiple circumferential cuts of the mid left SFA after predilatation with a 2.5 mm x 40 mm balloon as well as the above-the-knee popliteal artery. I then performed drug eluding balloon antral plasty with a 6 mm X  4 cm abdominal drug-eluting balloon of the mid left SFA and above-the-knee popliteal artery reducing a 99% focal calcified mid left SFA stenosis to 0% residual and a 90% eccentric above-the-knee left popliteal artery stenosis less than 20% residual. I then withdrew the sheath across the bifurcation and exchanged over an 035 wire for a short 7 [place] sheath. There was some "oozing around the sheath and  I operated to a short 8 [place] sheath.  Final Impression: Successful left mid SFA and above-the-knee popliteal artery  Hawk 1 directional atherectomy and drug-eluting balloon angioplasty using distal protection for critical limb ischemia. The sheath will be removed once he is to follow below 170 and pressure held. The patient is already on aspirin and Plavix. He will have hemodialysis tomorrow morning prior to being discharged home. He left the lab in stable condition.   Disposition   Pt is being discharged home today in good condition.  Follow-up Plans & Appointments     Contact information for follow-up providers    [name], MD. Schedule an appointment as  soon as possible for a visit in 2 week(s).   Specialties:  Vascular Surgery, Cardiology Contact information: [address] [place] [address] [phone]        Lorretta Harp, MD Follow up.   Specialties:  Cardiology, Radiology Why:  The office will call you within the next two business days to arrange Cardiology follow-up.  Contact information: [address] [place] [address] [phone]            Contact information for after-discharge care    [facility] Follow up.   Specialty:  [place] information: [address] [place] [phone]                   Discharge Medications     Medication List    TAKE these medications   acetaminophen 500 MG tablet Commonly known as:  TYLENOL Take 1,000 mg by mouth every 6 (six) hours as needed for mild pain or fever.   ARTIFICIAL TEAR OP Place 1 drop into both eyes every 6 (six) hours as needed (dry eyes).   aspirin 81 MG EC tablet Take 1 tablet (81 mg total) by mouth daily. Start taking on:  02/18/2017   atorvastatin 80 MG tablet Commonly known as:  LIPITOR Take 80 mg by mouth at bedtime.   budesonide 0.5 MG/2ML nebulizer solution Commonly known as:  PULMICORT Take 0.5 mg by nebulization every 6 (six) hours as needed (wheezing).   calcium acetate 667 MG capsule Commonly known as:  PHOSLO Take 2 capsules (1,334 mg total) by mouth 3 (three) times daily with meals. What changed:  how much to take  when to take this  additional instructions   cinacalcet 30 MG tablet Commonly known as:  SENSIPAR Take 30 mg by mouth every evening.   clopidogrel 75 MG tablet Commonly known as:  PLAVIX Take 75 mg by mouth daily.   feeding supplement (PRO-STAT SUGAR FREE 64) Liqd Take 30 mLs by mouth 2 (two) times daily.   levothyroxine 125 MCG tablet Commonly known as:  SYNTHROID, LEVOTHROID Take 125 mcg by  mouth daily before breakfast.   loratadine 10 MG tablet Commonly known as:  CLARITIN Take 10 mg by mouth daily.   LYRICA 50 MG capsule Generic drug:  pregabalin Take 1 tablet by mouth 3 (three) times daily.   MULTI-VITAMIN PO Take 1 tablet by mouth daily.   nortriptyline 25 MG capsule Commonly known as:  PAMELOR Take 50 mg by mouth at bedtime.   ranitidine 150 MG tablet Commonly known as:  ZANTAC Take 150 mg by mouth 2 (two) times daily.   risperiDONE 0.5 MG tablet Commonly known as:  RISPERDAL Take 1 tablet by mouth daily.   SALINE NASAL SPRAY NA Place  2 sprays into both nostrils as needed (seasonal allergies).   TOUJEO SOLOSTAR 300 UNIT/ML Sopn Generic drug:  Insulin Glargine Inject 36 Units as directed at bedtime.   traZODone 50 MG tablet Commonly known as:  DESYREL Take 50 mg by mouth daily.          Allergies Allergies  Allergen Reactions  . Morphine And Related Other (See Comments)    Not effective  . Zaroxolyn [Metolazone]      Outstanding Labs/Studies   Lower Extremity Doppler Studies in 2 weeks  Duration of Discharge Encounter   Greater than 30 minutes including physician time.  Signed, Erma Heritage, PA-C 02/17/2017, 12:20 PM

## 2017-02-17 NOTE — Progress Notes (Signed)
Per facility representative Gerald Stabs, patient can arrive to facility at anytime. CSW informed patient's RN. Patient to be transported via [place] to [facility]. D/C Summary send via Hub system. No further needs were requested at this time. CSW to sign off.   Please re-consult if further CSW needs arise.   Lucius Conn, [place] Worker [facility] [phone]

## 2017-02-17 NOTE — Progress Notes (Signed)
Subjective:  Feeling much better and his discharge has slowed down and has stopped.  Vascular surgery says okay to go home.  Objective:  Vital Signs in the last 24 hours: BP (!) 142/64 (BP Location: Right Arm)   Pulse 94   Temp 98.4 F (36.9 C) (Oral)   Resp 18   Ht 6\' 4"  (1.93 m)   Wt 103.9 kg (229 lb 0.9 oz)   SpO2 100%   BMI 27.88 kg/m   Physical Exam: Middle-aged male in no acute distress Lungs:  Clear Cardiac:  Regular rhythm, normal S1 and S2, no S3 Extremities:  Large ecchymoses noted.  No drainage today.    Intake/Output from previous day: 07/07 0701 - 07/08 0700 In: -  Out: 2600   Weight Filed Weights   02/14/17 1230 02/16/17 0648 02/16/17 1105  Weight: 104.2 kg (229 lb 11.5 oz) 106.5 kg (234 lb 12.6 oz) 103.9 kg (229 lb 0.9 oz)    Lab Results: Basic Metabolic Panel:  Recent Labs  02/16/17 0553 02/17/17 0155  NA 124* 130*  K 4.0 3.5  CL 87* 91*  CO2 23 28  GLUCOSE 203* 173*  BUN 57* 38*  CREATININE 7.70* 5.72*   CBC:  Recent Labs  02/16/17 0553 02/17/17 0155  WBC 6.9 5.7  HGB 8.0* 7.1*  HCT 24.0* 21.7*  MCV 92.0 93.5  PLT 203 215   Telemetry: Reviewed  Personally.  Normal sinus rhythm  Assessment/Plan:  1.  PAD with previous limb ischemia currently stable but had large groin hematoma that had to be surgically repaired Drainage has stopped and okay to go home from vascular surgery viewpoint. 2.  Acute blood loss anemia 3.  End-stage renal disease  Recommendations:  May discharge today.  Follow-up with Dr. [name] as well as Dr. Gwenlyn Found.  Continue hemodialysis schedule.     Kerry Hough  MD [facility]  02/17/2017, 11:28 AM

## 2017-02-17 NOTE — Progress Notes (Signed)
[facility] Rounding Note   Subjective/Interval history:    HD yesterday without incident Weight down to 103.9 (EDW 103.5) Says no dressing changes needed during the night Hopes to go home (to [facility] where he has been living) soon Biggest c/o is no sleep  Objective Vital signs in last 24 hours: Vitals:   02/16/17 1045 02/16/17 1105 02/16/17 1956 02/17/17 0511  BP: 90/60 (!) 116/57 (!) 156/73 (!) 142/64  Pulse: 85 83 89 94  Resp: 18 (!) 21 18 18   Temp:  (!) 97.5 F (36.4 C) 97.7 F (36.5 C) 98.4 F (36.9 C)  TempSrc:  Oral Oral Oral  SpO2:  98% 100% 100%  Weight:  103.9 kg (229 lb 0.9 oz)    Height:       Weight change: -2.6 kg (-5 lb 11.7 oz)  Intake/Output Summary (Last 24 hours) at 02/17/17 0728 Last data filed at 02/16/17 1105  Gross per 24 hour  Intake                0 ml  Output             2600 ml  Net            -2600 ml    Physical Exam: BP (!) 142/64 (BP Location: Right Arm)   Pulse 94   Temp 98.4 F (36.9 C) (Oral)   Resp 18   Ht 6\' 4"  (1.93 m)   Wt 103.9 kg (229 lb 0.9 oz)   SpO2 100%   BMI 27.88 kg/m   Tall large framed WM Sitting in WC beside the bed  Lungs clear Regular rhythm Normal S1S2 No S3 L upper AVF loud bruit heard across his chest - can't discern if murmur Abdomen soft, not tender 1+ woody changes LE's Dressing on L heel not removed R groin extensive hematoma, drains out, dressings appear dry  Recent Labs Lab 02/14/17 0800 02/16/17 0553 02/17/17 0155  NA 129* 124* 130*  K 3.5 4.0 3.5  CL 92* 87* 91*  CO2 23 23 28   GLUCOSE 111* 203* 173*  BUN 61* 57* 38*  CREATININE 7.87* 7.70* 5.72*  CALCIUM 8.2* 9.1 8.9  PHOS 5.7* 5.6* 4.6    Recent Labs Lab 02/14/17 0800 02/16/17 0553 02/17/17 0155  ALBUMIN 3.0* 3.3* 3.0*    Recent Labs Lab 02/12/17 2334 02/14/17 0800 02/15/17 2355 02/16/17 0553 02/17/17 0155  WBC 5.6 6.1 7.0 6.9 5.7  HGB 8.2* 7.2* 8.0* 8.0* 7.1*  HCT 24.6* 21.4* 23.4* 24.0* 21.7*  MCV 94.3 93.4 91.4  92.0 93.5  PLT 184 154 208 203 215    Recent Labs Lab 02/16/17 0550 02/16/17 1216 02/16/17 1625 02/16/17 2047 02/17/17 0616  GLUCAP 197* 142* 250* 207* 199*    Scheduled Medications: . aspirin EC  81 mg Oral Daily  . atorvastatin  80 mg Oral QHS  . calcium acetate  1,334 mg Oral TID WC  . Chlorhexidine Gluconate Cloth  6 each Topical Daily  . cinacalcet  30 mg Oral QPM  . clopidogrel  75 mg Oral Q breakfast  . collagenase   Topical Daily  . darbepoetin (ARANESP) injection - DIALYSIS  100 mcg Intravenous Q Thu-HD  . doxercalciferol  1 mcg Intravenous Q T,Th,Sa-HD  . feeding supplement (PRO-STAT SUGAR FREE 64)  30 mL Oral BID  . insulin aspart  0-9 Units Subcutaneous TID WC  . insulin glargine  36 Units Subcutaneous QHS  . levothyroxine  125 mcg Oral QAC breakfast  .  loratadine  10 mg Oral Daily  . nortriptyline  50 mg Oral QHS  . risperiDONE  0.5 mg Oral Daily  . sodium chloride flush  10-40 mL Intracatheter Q12H  . traZODone  50 mg Oral Daily   Dialyzes [facility]  TTS   EDW 103.5.  4 1/2 hours HD Bath 2K/2.5 calc,  Dialyzer unknown,  Heparin yes. Will be no heparin at discharge Access AVF L upper arm 15 gauge 400 BFR 8000 of epo q tx, 1 mcg of hect q tx, 71 venofer 1 time a week   Assessment/Plan:  66 year old WM with ESRD, DM, HTN, COPD, PAD, former tobacco use. Limb threatening ischemia/heel ulcer->atherectomy of LLE L SFA Dr. Gwenlyn Found 7/2 complicated by right groin hematoma and pseudoaneurysm requiring operative repair by VVS 7/2  1. PAD- s/p atherectomy L SFA complicated by R groin hematoma/pseudoaneurysm requiring surgical repair (all on 7/2) . Appears oozing from old JP sites has finally slowed down. Pt hopeful for [facility]  2. ESRD:  TTS via AVF. Had HD yesterday, got almost to EDW of 103.5. No heparin 3. Anemia of ESRD: Gets Epo and venofer with HD at his out pt unit.  4. ABLA 2/2 vascular procedures this admission. Transfused 2 units  on 02/11/17, 1 unit on 7/5. Darbe 100 on HD 7/5. Hb 8.1 yesterday, 7.1 today - VVS to decide on additional transfusion need. 5. CKD - MBD : continuing home phoslo, sensipar and hectorol   Jamal Maes, MD [facility] [phone] Pager 02/17/2017, 7:28 AM

## 2017-02-18 ENCOUNTER — Other Ambulatory Visit: Payer: Self-pay | Admitting: *Deleted

## 2017-02-18 DIAGNOSIS — L89620 Pressure ulcer of left heel, unstageable: Secondary | ICD-10-CM | POA: Diagnosis not present

## 2017-02-18 DIAGNOSIS — I739 Peripheral vascular disease, unspecified: Secondary | ICD-10-CM | POA: Diagnosis not present

## 2017-02-18 NOTE — Patient Outreach (Signed)
[facility]) Care Management  02/18/2017  [name] 04-23-1951 [id number]  Subjective: Telephone call to patient's home number, no answer, left HIPAA compliant voicemail message, and requested call back.   Objective: Per chart review, patient hospitalized  02/11/17 -02/17/17 for Critical lower limb ischemia.  Status post Evacuation of hematoma right groin, repair of right femoral pseudoaneurysm on 02/11/17.    Patient also has a history of: diabetes, hyperlipidemia, and ESRD on hemodialysis.     Assessment: Received UMR Transition of care referral on 02/14/17.   Transition of care follow up pending patient contact.    Plan: RNCM will call patient for 2nd telephone outreach attempt, transition of care follow up, within 10 business days if no return call.    [name], BSN, [place] Management [facility] Telephonic CM Phone: [phone] Fax: [phone]

## 2017-02-19 ENCOUNTER — Other Ambulatory Visit: Payer: Self-pay | Admitting: *Deleted

## 2017-02-19 DIAGNOSIS — N2581 Secondary hyperparathyroidism of renal origin: Secondary | ICD-10-CM | POA: Diagnosis not present

## 2017-02-19 DIAGNOSIS — D631 Anemia in chronic kidney disease: Secondary | ICD-10-CM | POA: Diagnosis not present

## 2017-02-19 DIAGNOSIS — N186 End stage renal disease: Secondary | ICD-10-CM | POA: Diagnosis not present

## 2017-02-19 DIAGNOSIS — Z992 Dependence on renal dialysis: Secondary | ICD-10-CM | POA: Diagnosis not present

## 2017-02-19 DIAGNOSIS — D509 Iron deficiency anemia, unspecified: Secondary | ICD-10-CM | POA: Diagnosis not present

## 2017-02-19 NOTE — Patient Outreach (Signed)
[facility]) Care Management  02/19/2017  [name] [birth year] [id number]   Subjective: Telephone call to patient's home number, no answer, left HIPAA compliant voicemail message, and requested call back.   Objective: Per chart review, patient hospitalized  02/11/17 -02/17/17 for Critical lower limb ischemia.  Status post Evacuation of hematoma right groin, repair of right femoral pseudoaneurysm on 02/11/17.    Patient also has a history of: diabetes, hyperlipidemia, and ESRD on hemodialysis.     Assessment: Received UMR Transition of care referral on 02/14/17.   Transition of care follow up pending patient contact.    Plan: RNCM will call patient for 3rd telephone outreach attempt, transition of care follow up, within 10 business days if no return call.    Lakeyta Vandenheuvel H. Annia Friendly, BSN, [place] Management [facility] Telephonic CM Phone: [phone] Fax: [phone]

## 2017-02-20 ENCOUNTER — Other Ambulatory Visit: Payer: Self-pay | Admitting: *Deleted

## 2017-02-20 NOTE — Patient Outreach (Addendum)
[facility]) Care Management  02/20/2017  [name] 02/08/1951 [id number]     Subjective: Telephone call to patient's home number, no answer, left HIPAA compliant voicemail message, and requested call back. Telephone call to patient's mobile number, spoke with patient, and HIPAA verified.  Discussed [facility] Care Management UMR Transition of care follow up, patient voiced understanding, and is in agreement to follow up.  Patient states he does not have [employer], has Medicare, [employer], and Medicaid.   RNCM advised patient not eligible for program.   Patient voices understanding and  is very appreciative of the follow up.    Objective: Per chart review, patient hospitalized 02/11/17 -02/17/17 for Critical lower limb ischemia. Status post Evacuation of hematoma right groin, repair of right femoral pseudoaneurysm on 02/11/17. Patient also has a history of: diabetes, hyperlipidemia, and ESRD on hemodialysis.    Assessment: Received UMR Transition of care referral on 02/14/17. Transition of care follow up not completed due to patient has ineligible payer, not eligible for program,  and will proceed with case closure.   Plan: RNCM will send case closure request due to patient being ineligible for services, noneligible payer, to [facility] [place] [facility].     Haroun Cotham H. Annia Friendly, BSN, [place] Management [facility] Telephonic CM Phone: [phone] Fax: [phone]

## 2017-02-21 ENCOUNTER — Telehealth: Payer: Self-pay | Admitting: Cardiovascular Disease

## 2017-02-21 DIAGNOSIS — F5104 Psychophysiologic insomnia: Secondary | ICD-10-CM | POA: Diagnosis not present

## 2017-02-21 DIAGNOSIS — N2581 Secondary hyperparathyroidism of renal origin: Secondary | ICD-10-CM | POA: Diagnosis not present

## 2017-02-21 DIAGNOSIS — E785 Hyperlipidemia, unspecified: Secondary | ICD-10-CM | POA: Diagnosis not present

## 2017-02-21 DIAGNOSIS — D509 Iron deficiency anemia, unspecified: Secondary | ICD-10-CM | POA: Diagnosis not present

## 2017-02-21 DIAGNOSIS — Z992 Dependence on renal dialysis: Secondary | ICD-10-CM | POA: Diagnosis not present

## 2017-02-21 DIAGNOSIS — N186 End stage renal disease: Secondary | ICD-10-CM | POA: Diagnosis not present

## 2017-02-21 DIAGNOSIS — D631 Anemia in chronic kidney disease: Secondary | ICD-10-CM | POA: Diagnosis not present

## 2017-02-21 DIAGNOSIS — F339 Major depressive disorder, recurrent, unspecified: Secondary | ICD-10-CM | POA: Diagnosis not present

## 2017-02-21 DIAGNOSIS — N185 Chronic kidney disease, stage 5: Secondary | ICD-10-CM | POA: Diagnosis not present

## 2017-02-21 NOTE — Telephone Encounter (Signed)
Returned call to [employer] She states she called VVS office. His discharge summary says he needs f/up with VVS, Dr. [name], who did recent surgical procedure. Scheduled in August w/Dr. Kara Dies who put in patient's fistula. Advised would need staples removed by VVS. She will call back to this office.

## 2017-02-21 NOTE — Telephone Encounter (Signed)
New message     Will Dr Gwenlyn Found remove the staples or do they need to take them out

## 2017-02-21 NOTE — Telephone Encounter (Signed)
Returned call to [employer] - was put on hold for >42minutes - will attempt to call back later

## 2017-02-21 NOTE — Telephone Encounter (Signed)
New message     Please call on the new number she left

## 2017-02-22 DIAGNOSIS — E0590 Thyrotoxicosis, unspecified without thyrotoxic crisis or storm: Secondary | ICD-10-CM | POA: Diagnosis not present

## 2017-02-22 DIAGNOSIS — E785 Hyperlipidemia, unspecified: Secondary | ICD-10-CM | POA: Diagnosis not present

## 2017-02-22 DIAGNOSIS — E1140 Type 2 diabetes mellitus with diabetic neuropathy, unspecified: Secondary | ICD-10-CM | POA: Diagnosis not present

## 2017-02-22 DIAGNOSIS — N185 Chronic kidney disease, stage 5: Secondary | ICD-10-CM | POA: Diagnosis not present

## 2017-02-23 DIAGNOSIS — Z992 Dependence on renal dialysis: Secondary | ICD-10-CM | POA: Diagnosis not present

## 2017-02-23 DIAGNOSIS — D509 Iron deficiency anemia, unspecified: Secondary | ICD-10-CM | POA: Diagnosis not present

## 2017-02-23 DIAGNOSIS — N2581 Secondary hyperparathyroidism of renal origin: Secondary | ICD-10-CM | POA: Diagnosis not present

## 2017-02-23 DIAGNOSIS — D631 Anemia in chronic kidney disease: Secondary | ICD-10-CM | POA: Diagnosis not present

## 2017-02-23 DIAGNOSIS — N186 End stage renal disease: Secondary | ICD-10-CM | POA: Diagnosis not present

## 2017-02-25 DIAGNOSIS — L89620 Pressure ulcer of left heel, unstageable: Secondary | ICD-10-CM | POA: Diagnosis not present

## 2017-02-25 DIAGNOSIS — I739 Peripheral vascular disease, unspecified: Secondary | ICD-10-CM | POA: Diagnosis not present

## 2017-02-25 DIAGNOSIS — T8131XA Disruption of external operation (surgical) wound, not elsewhere classified, initial encounter: Secondary | ICD-10-CM | POA: Diagnosis not present

## 2017-02-26 DIAGNOSIS — Z992 Dependence on renal dialysis: Secondary | ICD-10-CM | POA: Diagnosis not present

## 2017-02-26 DIAGNOSIS — N186 End stage renal disease: Secondary | ICD-10-CM | POA: Diagnosis not present

## 2017-02-26 DIAGNOSIS — N2581 Secondary hyperparathyroidism of renal origin: Secondary | ICD-10-CM | POA: Diagnosis not present

## 2017-02-26 DIAGNOSIS — D509 Iron deficiency anemia, unspecified: Secondary | ICD-10-CM | POA: Diagnosis not present

## 2017-02-26 DIAGNOSIS — D631 Anemia in chronic kidney disease: Secondary | ICD-10-CM | POA: Diagnosis not present

## 2017-02-28 DIAGNOSIS — D509 Iron deficiency anemia, unspecified: Secondary | ICD-10-CM | POA: Diagnosis not present

## 2017-02-28 DIAGNOSIS — D631 Anemia in chronic kidney disease: Secondary | ICD-10-CM | POA: Diagnosis not present

## 2017-02-28 DIAGNOSIS — Z992 Dependence on renal dialysis: Secondary | ICD-10-CM | POA: Diagnosis not present

## 2017-02-28 DIAGNOSIS — N186 End stage renal disease: Secondary | ICD-10-CM | POA: Diagnosis not present

## 2017-02-28 DIAGNOSIS — N2581 Secondary hyperparathyroidism of renal origin: Secondary | ICD-10-CM | POA: Diagnosis not present

## 2017-03-01 ENCOUNTER — Encounter: Payer: Self-pay | Admitting: Family

## 2017-03-02 DIAGNOSIS — D509 Iron deficiency anemia, unspecified: Secondary | ICD-10-CM | POA: Diagnosis not present

## 2017-03-02 DIAGNOSIS — D631 Anemia in chronic kidney disease: Secondary | ICD-10-CM | POA: Diagnosis not present

## 2017-03-02 DIAGNOSIS — Z992 Dependence on renal dialysis: Secondary | ICD-10-CM | POA: Diagnosis not present

## 2017-03-02 DIAGNOSIS — N186 End stage renal disease: Secondary | ICD-10-CM | POA: Diagnosis not present

## 2017-03-02 DIAGNOSIS — N2581 Secondary hyperparathyroidism of renal origin: Secondary | ICD-10-CM | POA: Diagnosis not present

## 2017-03-05 DIAGNOSIS — N186 End stage renal disease: Secondary | ICD-10-CM | POA: Diagnosis not present

## 2017-03-05 DIAGNOSIS — D631 Anemia in chronic kidney disease: Secondary | ICD-10-CM | POA: Diagnosis not present

## 2017-03-05 DIAGNOSIS — D509 Iron deficiency anemia, unspecified: Secondary | ICD-10-CM | POA: Diagnosis not present

## 2017-03-05 DIAGNOSIS — Z992 Dependence on renal dialysis: Secondary | ICD-10-CM | POA: Diagnosis not present

## 2017-03-05 DIAGNOSIS — N2581 Secondary hyperparathyroidism of renal origin: Secondary | ICD-10-CM | POA: Diagnosis not present

## 2017-03-06 ENCOUNTER — Other Ambulatory Visit: Payer: Self-pay | Admitting: Cardiovascular Disease

## 2017-03-06 DIAGNOSIS — I739 Peripheral vascular disease, unspecified: Secondary | ICD-10-CM

## 2017-03-06 DIAGNOSIS — T8131XA Disruption of external operation (surgical) wound, not elsewhere classified, initial encounter: Secondary | ICD-10-CM | POA: Diagnosis not present

## 2017-03-07 ENCOUNTER — Encounter: Payer: Self-pay | Admitting: Family

## 2017-03-07 DIAGNOSIS — D631 Anemia in chronic kidney disease: Secondary | ICD-10-CM | POA: Diagnosis not present

## 2017-03-07 DIAGNOSIS — N186 End stage renal disease: Secondary | ICD-10-CM | POA: Diagnosis not present

## 2017-03-07 DIAGNOSIS — N2581 Secondary hyperparathyroidism of renal origin: Secondary | ICD-10-CM | POA: Diagnosis not present

## 2017-03-07 DIAGNOSIS — D509 Iron deficiency anemia, unspecified: Secondary | ICD-10-CM | POA: Diagnosis not present

## 2017-03-07 DIAGNOSIS — Z992 Dependence on renal dialysis: Secondary | ICD-10-CM | POA: Diagnosis not present

## 2017-03-08 ENCOUNTER — Ambulatory Visit (HOSPITAL_COMMUNITY)
Admission: RE | Admit: 2017-03-08 | Discharge: 2017-03-08 | Disposition: A | Discharge status: Home / Self Care | Payer: No Typology Code available for payment source | Source: Ambulatory Visit | Attending: Cardiovascular Disease | Admitting: Cardiovascular Disease

## 2017-03-08 ENCOUNTER — Ambulatory Visit (INDEPENDENT_AMBULATORY_CARE_PROVIDER_SITE_OTHER): Payer: Self-pay | Admitting: Family

## 2017-03-08 ENCOUNTER — Encounter: Payer: Self-pay | Admitting: Cardiovascular Disease

## 2017-03-08 ENCOUNTER — Encounter: Payer: Self-pay | Admitting: Family

## 2017-03-08 ENCOUNTER — Ambulatory Visit (INDEPENDENT_AMBULATORY_CARE_PROVIDER_SITE_OTHER): Payer: Medicare Other | Admitting: Cardiovascular Disease

## 2017-03-08 VITALS — BP 128/68 | HR 85 | Ht 76.0 in | Wt 238.0 lb

## 2017-03-08 VITALS — BP 119/67 | HR 74 | Temp 97.7°F | Resp 16 | Ht 76.0 in | Wt 234.0 lb

## 2017-03-08 DIAGNOSIS — I739 Peripheral vascular disease, unspecified: Secondary | ICD-10-CM | POA: Diagnosis not present

## 2017-03-08 DIAGNOSIS — I998 Other disorder of circulatory system: Secondary | ICD-10-CM | POA: Diagnosis not present

## 2017-03-08 DIAGNOSIS — I724 Aneurysm of artery of lower extremity: Secondary | ICD-10-CM

## 2017-03-08 DIAGNOSIS — I70229 Atherosclerosis of native arteries of extremities with rest pain, unspecified extremity: Secondary | ICD-10-CM

## 2017-03-08 DIAGNOSIS — Z4802 Encounter for removal of sutures: Secondary | ICD-10-CM

## 2017-03-08 DIAGNOSIS — I70202 Unspecified atherosclerosis of native arteries of extremities, left leg: Secondary | ICD-10-CM | POA: Diagnosis not present

## 2017-03-08 NOTE — Progress Notes (Signed)
Postoperative Visit   History of Present Illness  [name] is a 66 y.o. male who is s/p evacuation of hematoma right groin, repair of right femoral pseudoaneurysm on 02-11-17 by Dr. [name] for hematoma right groin with right femoral pseudoaneurysm. Operative findings: #1 high common femoral bifurcation  #2  2 separate needle holes repaired one at the origin of the profunda, 1 approximately 4 cm distal to the superficial femoral artery origin.  Pt returns today for staples removal from right groin.  Pt is s/p left RC AVF in 2012 and subsequent revision.  On 12/18/12, he underwent placement of a new left BC AVF and subsequent ligation of competing branches with superficialization of fistula on 06/18/13.  He has done well with this.  He presents today with a pseudoaneurysm of the fistula.  He does have two button holes, which he dialyzes through.    He dialyzes M/W/F.    He has an appointment with Dr. Bridgett Larsson on 04-05-18 s/p angiogram with bilateral leg run off by Dr. Gwenlyn Found on 02-11-17.   He is receiving rehabilitative services at the [facility], has trouble walking.    For VQI Use Only  PRE-ADM LIVING: Nursing home, [facility]  AMB STATUS: Wheelchair  Past Medical History:  Diagnosis Date  . Anemia   . Anxiety   . Arthritis   . Chronic kidney disease   . COPD (chronic obstructive pulmonary disease) ([place])    Pt denies  . Depression   . Diabetes mellitus without complication ([place])   . GERD (gastroesophageal reflux disease)   . H/O hiatal hernia   . Headache(784.0)    Hx: Migraines  . Hypertension   . Neuropathy   . Numbness of toes    toes and feet  . Pneumonia    Hx: of several times  . Renal insufficiency   . Shortness of breath dyspnea   . Type 2 diabetes mellitus ([place])     Past Surgical History:  Procedure Laterality Date  . AV FISTULA PLACEMENT  2012      left arm   . AV FISTULA PLACEMENT Left 12/18/2012   Procedure: ARTERIOVENOUS (AV) FISTULA CREATION;   Surgeon: Angelia Mould, MD;  Location: [place];  Service: Vascular;  Laterality: Left;  . COLONOSCOPY  10/26/2011   Procedure: COLONOSCOPY;  Surgeon: Rogene Houston, MD;  [facility];  Service: Endoscopy;  Laterality: N/A;  730  . EMBOLECTOMY Left 12/09/2012   Procedure: EMBOLECTOMY;  Surgeon: Serafina Mitchell, MD;  Location: [facility] CATH LAB;  Service: Cardiovascular;  Laterality: Left;  left arm venous embolization  . ESOPHAGOGASTRODUODENOSCOPY (EGD) WITH ESOPHAGEAL DILATION N/A 04/23/2013   Procedure: ESOPHAGOGASTRODUODENOSCOPY (EGD) WITH ESOPHAGEAL DILATION;  Surgeon: Rogene Houston, MD;  [facility];  Service: Endoscopy;  Laterality: N/A;  200-moved to 930   . FISTULA SUPERFICIALIZATION Left 06/18/2013   Procedure: FISTULA SUPERFICIALIZATION & LIGATION BRANCH X 1;  Surgeon: Mal Misty, MD;  Location: [place];  Service: Vascular;  Laterality: Left;  . HEMATOMA EVACUATION Right 02/11/2017   Procedure: EVACUATION HEMATOMA RIGHT GROIN, Repair of Right Pseudo-anerysm.;  Surgeon: [name], MD;  Location: [facility] OR;  Service: Vascular;  Laterality: Right;  . INGUINAL HERNIA REPAIR     ,  times   2  . INSERTION OF DIALYSIS CATHETER Left 12/18/2012   Procedure: INSERTION OF DIALYSIS CATHETER;  Surgeon: Angelia Mould, MD;  Location: [place];  Service: Vascular;  Laterality: Left;  .  KNEE ARTHROSCOPY  2011   Right Knee  . LOWER EXTREMITY ANGIOGRAPHY N/A 02/11/2017   Procedure: Lower Extremity Angiography;  Surgeon: Lorretta Harp, MD;  Location: [place] CV LAB;  Service: Cardiovascular;  Laterality: N/A;  . PERIPHERAL ATHRECTOMY  02/11/2017  . PERIPHERAL VASCULAR ATHERECTOMY Left 02/11/2017   Procedure: Peripheral Vascular Atherectomy;  Surgeon: Lorretta Harp, MD;  Location: [place] CV LAB;  Service: Cardiovascular;  Laterality: Left;  . REVISON OF ARTERIOVENOUS FISTULA Left 9/37/1696   Procedure: PLICATION OF LEFT BRACHIOCEPHALIC ARTERIOVENOUS FISTULA;   Surgeon: Conrad Luxemburg, MD;  Location: [place];  Service: Vascular;  Laterality: Left;  . SHUNTOGRAM N/A 12/09/2012   Procedure: fistulogram;  Surgeon: Serafina Mitchell, MD;  Location: [facility] CATH LAB;  Service: Cardiovascular;  Laterality: N/A;  . SHUNTOGRAM Left 06/03/2013   Procedure: Fistulogram;  Surgeon: Serafina Mitchell, MD;  Location: [facility] CATH LAB;  Service: Cardiovascular;  Laterality: Left;  . THROMBECTOMY W/ EMBOLECTOMY Left 12/11/2012   Procedure: THROMBECTOMY ARTERIOVENOUS FISTULA;  Surgeon: Serafina Mitchell, MD;  Location: [place];  Service: Vascular;  Laterality: Left;  . TONSILLECTOMY      Social History   Social History  . Marital status: Married    Spouse name: N/A  . Number of children: N/A  . Years of education: N/A   Occupational History  . Not on file.   Social History Main Topics  . Smoking status: Former Smoker    Years: 10.00    Types: Cigars    Quit date: 03/13/2012  . Smokeless tobacco: Never Used  . Alcohol use No  . Drug use: No  . Sexual activity: No   Other Topics Concern  . Not on file   Social History Narrative  . No narrative on file    Allergies  Allergen Reactions  . Morphine And Related Other (See Comments)    Not effective  . Zaroxolyn [Metolazone]     Current Outpatient Prescriptions on File Prior to Visit  Medication Sig Dispense Refill  . acetaminophen (TYLENOL) 500 MG tablet Take 1,000 mg by mouth every 6 (six) hours as needed for mild pain or fever.    . Amino Acids-Protein Hydrolys (FEEDING SUPPLEMENT, PRO-STAT SUGAR FREE 64,) LIQD Take 30 mLs by mouth 2 (two) times daily.    . ARTIFICIAL TEAR OP Place 1 drop into both eyes every 6 (six) hours as needed (dry eyes).    Marland Kitchen aspirin EC 81 MG EC tablet Take 1 tablet (81 mg total) by mouth daily.    Marland Kitchen atorvastatin (LIPITOR) 80 MG tablet Take 80 mg by mouth at bedtime.  5  . budesonide (PULMICORT) 0.5 MG/2ML nebulizer solution Take 0.5 mg by nebulization every 6 (six) hours as needed (wheezing).     . calcium acetate (PHOSLO) 667 MG capsule Take 2 capsules (1,334 mg total) by mouth 3 (three) times daily with meals.  10  . cinacalcet (SENSIPAR) 30 MG tablet Take 30 mg by mouth every evening.    . clopidogrel (PLAVIX) 75 MG tablet Take 75 mg by mouth daily.    Marland Kitchen levothyroxine (SYNTHROID, LEVOTHROID) 125 MCG tablet Take 125 mcg by mouth daily before breakfast.    . LYRICA 50 MG capsule Take 1 tablet by mouth 3 (three) times daily.  4  . Multiple Vitamin (MULTI-VITAMIN PO) Take 1 tablet by mouth daily.    . nortriptyline (PAMELOR) 25 MG capsule Take 50 mg by mouth at bedtime.   10  . risperiDONE (RISPERDAL)  0.5 MG tablet Take 1 tablet by mouth daily.  10  . SALINE NASAL SPRAY NA Place 2 sprays into both nostrils as needed (seasonal allergies).    [name] 300 UNIT/ML SOPN Inject 36 Units as directed at bedtime.  10  . traZODone (DESYREL) 50 MG tablet Take 50 mg by mouth daily.   10  . loratadine (CLARITIN) 10 MG tablet Take 10 mg by mouth daily.    . ranitidine (ZANTAC) 150 MG tablet Take 150 mg by mouth 2 (two) times daily.      No current facility-administered medications on file prior to visit.      Physical Examination  Vitals:   03/08/17 1114  BP: 119/67  Pulse: 74  Resp: 16  Temp: 97.7 F (36.5 C)  TempSrc: Oral  SpO2: 98%  Weight: 234 lb (106.1 kg)  Height: 6\' 4"  (1.93 m)   Body mass index is 28.48 kg/m.  PHYSICAL EXAMINATION: General: The patient appears their stated age.   HEENT:  No gross abnormalities Pulmonary: Respirations are non-labored Abdomen: Soft and non-tender Musculoskeletal: There are no major deformities.   Neurologic: No focal weakness or paresthesias are detected, Skin: There are no ulcer or rashes noted. Right groin incision with staples in place, edges well proximated, no erythema or induration, consolidated hematoma adjacent to right groin incision laterally, no drainage from incision. Both feet and all toes are pink and warm with  brisk capillary refill.  Psychiatric: The patient has normal affect. Cardiovascular: There is a regular rate and rhythm.  Vascular: Vessel Right Left  Radial Palpable Palpable  Brachial Palpable Palpable  Aorta Not palpable N/A  Femoral Not Palpable Palpable  Popliteal Not palpable Not palpable  PT not Palpable faintly Palpable  DP faintly Palpable Not  Palpable    Medical Decision Making  [name] is a 66 y.o. male who is s/p evacuation of hematoma right groin, repair of right femoral pseudoaneurysm on 02-11-17 by Dr. [name] for hematoma right groin with right femoral pseudoaneurysm. All sutures removed from right groin, incision healing well, no signs of infection. He is receiving rehabilitative services at the [facility].   Follow up with Dr. Bridgett Larsson as scheduled on 04-05-17.   Thank you for allowing [place] to participate in this patient's care.  NICKEL, Sharmon Leyden, RN, MSN, FNP-C [facility] Office: [phone]  03/08/2017, 11:25 AM  Clinic MD

## 2017-03-08 NOTE — Progress Notes (Signed)
03/08/2017 [name]   1950-08-16  [id number]  Primary Physician Caryl Bis, MD Primary Cardiologist: Lorretta Harp MD Renae Gloss  HPI:  [name] is referred by The rehabilitation center in [facility] for evaluation of peripheral arterial disease. I last saw him in the office 02/08/17. He has a history of diabetes and hyperlipidemia. He stopped smoking 5 years ago. He has never had a heart attack or stroke. 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 He is currently in a rehabilitation facility. He just completed 5 weeks of intravenous antibiotics. He has a significant left heel ulcer with a right heel ulcer that has since healed. He did have Dopplers performed in the rehabilitation Center that showed significant peripheral vascular disease.  he underwent peripheral angiography and intervention by myself on 02/11/17 at which time I intervened on a left mid and distal SFA stenosis with a favorable angiographic result. He did have 3 vessel runoff. Unfortunately that evening he had a right groin bleed requiring surgical intervention by Dr. [name].His right coronary staples were removed this morning. His left heel wound has healed nicely over the last several weeks.   Current Meds  Medication Sig  . acetaminophen (TYLENOL) 500 MG tablet Take 1,000 mg by mouth every 6 (six) hours as needed for mild pain or fever.  . Amino Acids-Protein Hydrolys (FEEDING SUPPLEMENT, PRO-STAT SUGAR FREE 64,) LIQD Take 30 mLs by mouth 2 (two) times daily.  . ARTIFICIAL TEAR OP Place 1 drop into both eyes every 6 (six) hours as needed (dry eyes).  Marland Kitchen aspirin EC 81 MG EC tablet Take 1 tablet (81 mg total) by mouth daily.  Marland Kitchen atorvastatin (LIPITOR) 80 MG tablet Take 80 mg by mouth at  bedtime.  . budesonide (PULMICORT) 0.5 MG/2ML nebulizer solution Take 0.5 mg by nebulization every 6 (six) hours as needed (wheezing).  . calcium acetate (PHOSLO) 667 MG capsule Take 2 capsules (1,334 mg total) by mouth 3 (three) times daily with meals.  . cinacalcet (SENSIPAR) 30 MG tablet Take 30 mg by mouth every evening.  . clopidogrel (PLAVIX) 75 MG tablet Take 75 mg by mouth daily.  . collagenase (SANTYL) ointment Apply 1 application topically daily.  . insulin NPH-regular Human (NOVOLIN 70/30) (70-30) 100 UNIT/ML injection Inject into the skin.  Marland Kitchen ipratropium-albuterol (DUONEB) 0.5-2.5 (3) MG/3ML SOLN Take 3 mLs by nebulization.  Marland Kitchen levothyroxine (SYNTHROID, LEVOTHROID) 125 MCG tablet Take 125 mcg by mouth daily before breakfast.  . lisinopril (PRINIVIL,ZESTRIL) 10 MG tablet Take 10 mg by mouth daily.  Marland Kitchen loratadine (CLARITIN) 10 MG tablet Take 10 mg by mouth daily.  Marland Kitchen LYRICA 50 MG capsule Take 1 tablet by mouth 3 (three) times daily.  . Multiple Vitamin (MULTI-VITAMIN PO) Take 1 tablet by mouth daily.  . nortriptyline (PAMELOR) 25 MG capsule Take 50 mg by mouth at bedtime.   . polyethylene glycol (MIRALAX / GLYCOLAX) packet Take 17 g by mouth daily.  . povidone-iodine (BETADINE) 10 % ointment Apply 1 application topically as needed for wound care. Right groin surgical site; paint incision daily with betadine then apply gauze M, T, W.  . ranitidine (ZANTAC) 150 MG tablet Take 150 mg by mouth 2 (two) times daily.   . risperiDONE (RISPERDAL) 0.5 MG  tablet Take 1 tablet by mouth daily.  Marland Kitchen SALINE NASAL SPRAY NA Place 2 sprays into both nostrils as needed (seasonal allergies).  [name] 300 UNIT/ML SOPN Inject 36 Units as directed at bedtime.  . traZODone (DESYREL) 50 MG tablet Take 50 mg by mouth daily.      Allergies  Allergen Reactions  . Morphine And Related Other (See Comments)    Not effective  . Zaroxolyn [Metolazone]     Social History   Social History  . Marital  status: Married    Spouse name: N/A  . Number of children: N/A  . Years of education: N/A   Occupational History  . Not on file.   Social History Main Topics  . Smoking status: Former Smoker    Years: 10.00    Types: Cigars    Quit date: 03/13/2012  . Smokeless tobacco: Never Used  . Alcohol use No  . Drug use: No  . Sexual activity: No   Other Topics Concern  . Not on file   Social History Narrative  . No narrative on file     Review of Systems: General: negative for chills, fever, night sweats or weight changes.  Cardiovascular: negative for chest pain, dyspnea on exertion, edema, orthopnea, palpitations, paroxysmal nocturnal dyspnea or shortness of breath Dermatological: negative for rash Respiratory: negative for cough or wheezing Urologic: negative for hematuria Abdominal: negative for nausea, vomiting, diarrhea, bright red blood per rectum, melena, or hematemesis Neurologic: negative for visual changes, syncope, or dizziness All other systems reviewed and are otherwise negative except as noted above.    Blood pressure 128/68, pulse 85, height 6\' 4"  (1.93 m), weight 238 lb (108 kg).  General appearance: alert and no distress Neck: no adenopathy, no carotid bruit, no JVD, supple, symmetrical, trachea midline and thyroid not enlarged, symmetric, no tenderness/mass/nodules Lungs: clear to auscultation bilaterally Heart: regular rate and rhythm, S1, S2 normal, no murmur, click, rub or gallop Extremities: extremities normal, atraumatic, no cyanosis or edema  EKG not performed today  ASSESSMENT AND PLAN:   Critical lower limb ischemia [name] returns today for post hospital follow-up after his recent admission for angiography and intervention 02/11/17. I successfully intervened on the mid and distal left SFA stenosis for a nonhealing wound on his left heel. The procedure was successful however that night he had a right groin bleed requiring surgical intervention by Dr.  [name]. This delayed his discharge by several days. His right femoral surgical site has healed well and the staples were removed this morning. His heel wound is healing nicely as well. He is scheduled for lower extremity arterial Dopplers today. I will see him back in 3 months for follow-up.      Lorretta Harp MD FACP,FACC,FAHA, [facility] 03/08/2017 1:44 PM

## 2017-03-08 NOTE — Patient Instructions (Signed)
Medication Instructions: Your physician recommends that you continue on your current medications as directed. Please refer to the Current Medication list given to you today.   Testing/Procedures: Your physician has requested that you have a lower extremity arterial duplex. During this test, ultrasound is used to evaluate arterial blood flow in the legs. Allow one hour for this exam. There are no restrictions or special instructions.  Your physician has requested that you have an ankle brachial index (ABI). During this test an ultrasound and blood pressure cuff are used to evaluate the arteries that supply the arms and legs with blood. Allow thirty minutes for this exam. There are no restrictions or special instructions.  (In 6 months)   Follow-Up: Your physician recommends that you schedule a follow-up appointment in: 3 months with Dr. Gwenlyn Found.  If you need a refill on your cardiac medications before your next appointment, please call your pharmacy.

## 2017-03-08 NOTE — Assessment & Plan Note (Signed)
[name] returns today for post hospital follow-up after his recent admission for angiography and intervention 02/11/17. I successfully intervened on the mid and distal left SFA stenosis for a nonhealing wound on his left heel. The procedure was successful however that night he had a right groin bleed requiring surgical intervention by Dr. [name]. This delayed his discharge by several days. His right femoral surgical site has healed well and the staples were removed this morning. His heel wound is healing nicely as well. He is scheduled for lower extremity arterial Dopplers today. I will see him back in 3 months for follow-up.

## 2017-03-09 DIAGNOSIS — E889 Metabolic disorder, unspecified: Secondary | ICD-10-CM | POA: Insufficient documentation

## 2017-03-09 DIAGNOSIS — N2581 Secondary hyperparathyroidism of renal origin: Secondary | ICD-10-CM | POA: Diagnosis not present

## 2017-03-09 DIAGNOSIS — N186 End stage renal disease: Secondary | ICD-10-CM | POA: Diagnosis not present

## 2017-03-09 DIAGNOSIS — D631 Anemia in chronic kidney disease: Secondary | ICD-10-CM | POA: Diagnosis not present

## 2017-03-09 DIAGNOSIS — M9080 Osteopathy in diseases classified elsewhere, unspecified site: Secondary | ICD-10-CM | POA: Insufficient documentation

## 2017-03-09 DIAGNOSIS — Z992 Dependence on renal dialysis: Secondary | ICD-10-CM | POA: Diagnosis not present

## 2017-03-09 DIAGNOSIS — D509 Iron deficiency anemia, unspecified: Secondary | ICD-10-CM | POA: Diagnosis not present

## 2017-03-12 ENCOUNTER — Other Ambulatory Visit: Payer: Self-pay | Admitting: Cardiovascular Disease

## 2017-03-12 DIAGNOSIS — I998 Other disorder of circulatory system: Secondary | ICD-10-CM

## 2017-03-12 DIAGNOSIS — N186 End stage renal disease: Secondary | ICD-10-CM | POA: Diagnosis not present

## 2017-03-12 DIAGNOSIS — I70229 Atherosclerosis of native arteries of extremities with rest pain, unspecified extremity: Secondary | ICD-10-CM

## 2017-03-12 DIAGNOSIS — Z992 Dependence on renal dialysis: Secondary | ICD-10-CM | POA: Diagnosis not present

## 2017-03-12 DIAGNOSIS — D509 Iron deficiency anemia, unspecified: Secondary | ICD-10-CM | POA: Diagnosis not present

## 2017-03-12 DIAGNOSIS — N2581 Secondary hyperparathyroidism of renal origin: Secondary | ICD-10-CM | POA: Diagnosis not present

## 2017-03-12 DIAGNOSIS — D631 Anemia in chronic kidney disease: Secondary | ICD-10-CM | POA: Diagnosis not present

## 2017-03-13 DIAGNOSIS — Z992 Dependence on renal dialysis: Secondary | ICD-10-CM | POA: Diagnosis not present

## 2017-03-13 DIAGNOSIS — D631 Anemia in chronic kidney disease: Secondary | ICD-10-CM | POA: Diagnosis not present

## 2017-03-13 DIAGNOSIS — D509 Iron deficiency anemia, unspecified: Secondary | ICD-10-CM | POA: Diagnosis not present

## 2017-03-13 DIAGNOSIS — N186 End stage renal disease: Secondary | ICD-10-CM | POA: Diagnosis not present

## 2017-03-13 DIAGNOSIS — N2581 Secondary hyperparathyroidism of renal origin: Secondary | ICD-10-CM | POA: Diagnosis not present

## 2017-03-14 DIAGNOSIS — Z992 Dependence on renal dialysis: Secondary | ICD-10-CM | POA: Diagnosis not present

## 2017-03-14 DIAGNOSIS — N2581 Secondary hyperparathyroidism of renal origin: Secondary | ICD-10-CM | POA: Diagnosis not present

## 2017-03-14 DIAGNOSIS — D509 Iron deficiency anemia, unspecified: Secondary | ICD-10-CM | POA: Diagnosis not present

## 2017-03-14 DIAGNOSIS — D631 Anemia in chronic kidney disease: Secondary | ICD-10-CM | POA: Diagnosis not present

## 2017-03-14 DIAGNOSIS — N186 End stage renal disease: Secondary | ICD-10-CM | POA: Diagnosis not present

## 2017-03-15 DIAGNOSIS — F339 Major depressive disorder, recurrent, unspecified: Secondary | ICD-10-CM | POA: Diagnosis not present

## 2017-03-15 DIAGNOSIS — N186 End stage renal disease: Secondary | ICD-10-CM | POA: Diagnosis not present

## 2017-03-15 DIAGNOSIS — E785 Hyperlipidemia, unspecified: Secondary | ICD-10-CM | POA: Diagnosis not present

## 2017-03-15 DIAGNOSIS — F411 Generalized anxiety disorder: Secondary | ICD-10-CM | POA: Diagnosis not present

## 2017-03-16 DIAGNOSIS — Z992 Dependence on renal dialysis: Secondary | ICD-10-CM | POA: Diagnosis not present

## 2017-03-16 DIAGNOSIS — N186 End stage renal disease: Secondary | ICD-10-CM | POA: Diagnosis not present

## 2017-03-16 DIAGNOSIS — D631 Anemia in chronic kidney disease: Secondary | ICD-10-CM | POA: Diagnosis not present

## 2017-03-16 DIAGNOSIS — D509 Iron deficiency anemia, unspecified: Secondary | ICD-10-CM | POA: Diagnosis not present

## 2017-03-16 DIAGNOSIS — N2581 Secondary hyperparathyroidism of renal origin: Secondary | ICD-10-CM | POA: Diagnosis not present

## 2017-03-17 DIAGNOSIS — Z992 Dependence on renal dialysis: Secondary | ICD-10-CM | POA: Diagnosis not present

## 2017-03-17 DIAGNOSIS — N2581 Secondary hyperparathyroidism of renal origin: Secondary | ICD-10-CM | POA: Diagnosis not present

## 2017-03-17 DIAGNOSIS — D509 Iron deficiency anemia, unspecified: Secondary | ICD-10-CM | POA: Diagnosis not present

## 2017-03-17 DIAGNOSIS — N186 End stage renal disease: Secondary | ICD-10-CM | POA: Diagnosis not present

## 2017-03-17 DIAGNOSIS — D631 Anemia in chronic kidney disease: Secondary | ICD-10-CM | POA: Diagnosis not present

## 2017-03-18 DIAGNOSIS — I739 Peripheral vascular disease, unspecified: Secondary | ICD-10-CM | POA: Diagnosis not present

## 2017-03-18 DIAGNOSIS — L89620 Pressure ulcer of left heel, unstageable: Secondary | ICD-10-CM | POA: Diagnosis not present

## 2017-03-19 DIAGNOSIS — D631 Anemia in chronic kidney disease: Secondary | ICD-10-CM | POA: Diagnosis not present

## 2017-03-19 DIAGNOSIS — Z992 Dependence on renal dialysis: Secondary | ICD-10-CM | POA: Diagnosis not present

## 2017-03-19 DIAGNOSIS — N186 End stage renal disease: Secondary | ICD-10-CM | POA: Diagnosis not present

## 2017-03-19 DIAGNOSIS — D509 Iron deficiency anemia, unspecified: Secondary | ICD-10-CM | POA: Diagnosis not present

## 2017-03-19 DIAGNOSIS — N2581 Secondary hyperparathyroidism of renal origin: Secondary | ICD-10-CM | POA: Diagnosis not present

## 2017-03-21 DIAGNOSIS — N186 End stage renal disease: Secondary | ICD-10-CM | POA: Diagnosis not present

## 2017-03-21 DIAGNOSIS — Z992 Dependence on renal dialysis: Secondary | ICD-10-CM | POA: Diagnosis not present

## 2017-03-21 DIAGNOSIS — N2581 Secondary hyperparathyroidism of renal origin: Secondary | ICD-10-CM | POA: Diagnosis not present

## 2017-03-21 DIAGNOSIS — D509 Iron deficiency anemia, unspecified: Secondary | ICD-10-CM | POA: Diagnosis not present

## 2017-03-21 DIAGNOSIS — D631 Anemia in chronic kidney disease: Secondary | ICD-10-CM | POA: Diagnosis not present

## 2017-03-23 DIAGNOSIS — N2581 Secondary hyperparathyroidism of renal origin: Secondary | ICD-10-CM | POA: Diagnosis not present

## 2017-03-23 DIAGNOSIS — D631 Anemia in chronic kidney disease: Secondary | ICD-10-CM | POA: Diagnosis not present

## 2017-03-23 DIAGNOSIS — N186 End stage renal disease: Secondary | ICD-10-CM | POA: Diagnosis not present

## 2017-03-23 DIAGNOSIS — Z992 Dependence on renal dialysis: Secondary | ICD-10-CM | POA: Diagnosis not present

## 2017-03-23 DIAGNOSIS — D509 Iron deficiency anemia, unspecified: Secondary | ICD-10-CM | POA: Diagnosis not present

## 2017-03-25 DIAGNOSIS — I739 Peripheral vascular disease, unspecified: Secondary | ICD-10-CM | POA: Diagnosis not present

## 2017-03-25 DIAGNOSIS — L89623 Pressure ulcer of left heel, stage 3: Secondary | ICD-10-CM | POA: Diagnosis not present

## 2017-03-26 ENCOUNTER — Encounter: Payer: Self-pay | Admitting: Vascular Surgery

## 2017-03-26 DIAGNOSIS — D631 Anemia in chronic kidney disease: Secondary | ICD-10-CM | POA: Diagnosis not present

## 2017-03-26 DIAGNOSIS — D509 Iron deficiency anemia, unspecified: Secondary | ICD-10-CM | POA: Diagnosis not present

## 2017-03-26 DIAGNOSIS — N186 End stage renal disease: Secondary | ICD-10-CM | POA: Diagnosis not present

## 2017-03-26 DIAGNOSIS — N2581 Secondary hyperparathyroidism of renal origin: Secondary | ICD-10-CM | POA: Diagnosis not present

## 2017-03-26 DIAGNOSIS — Z992 Dependence on renal dialysis: Secondary | ICD-10-CM | POA: Diagnosis not present

## 2017-03-28 DIAGNOSIS — N2581 Secondary hyperparathyroidism of renal origin: Secondary | ICD-10-CM | POA: Diagnosis not present

## 2017-03-28 DIAGNOSIS — N186 End stage renal disease: Secondary | ICD-10-CM | POA: Diagnosis not present

## 2017-03-28 DIAGNOSIS — Z992 Dependence on renal dialysis: Secondary | ICD-10-CM | POA: Diagnosis not present

## 2017-03-28 DIAGNOSIS — D509 Iron deficiency anemia, unspecified: Secondary | ICD-10-CM | POA: Diagnosis not present

## 2017-03-28 DIAGNOSIS — D631 Anemia in chronic kidney disease: Secondary | ICD-10-CM | POA: Diagnosis not present

## 2017-03-29 ENCOUNTER — Telehealth: Payer: Self-pay | Admitting: Vascular Surgery

## 2017-03-29 NOTE — Telephone Encounter (Signed)
-----   Message from Mena Goes, RN sent at 03/29/2017  9:32 AM EDT ----- Regarding: Change appt to Dr. [name] per Dr. Bridgett Larsson    ----- Message ----- From: Conrad Selma, MD Sent: 03/29/2017   9:23 AM To: [facility] [address]  [name] [id number] [birth year]  Not certain why this pt is scheduled for me.  Pt is post-op a procedure with Dr. [name]

## 2017-03-29 NOTE — Telephone Encounter (Signed)
Spoke to Ne[place] at [facility] to inform her of the changes to the appt, stated she did not want to res at this time and would call back to res if they feel he needs to come back in. States he is doing well at this time

## 2017-03-30 DIAGNOSIS — D509 Iron deficiency anemia, unspecified: Secondary | ICD-10-CM | POA: Diagnosis not present

## 2017-03-30 DIAGNOSIS — Z992 Dependence on renal dialysis: Secondary | ICD-10-CM | POA: Diagnosis not present

## 2017-03-30 DIAGNOSIS — N186 End stage renal disease: Secondary | ICD-10-CM | POA: Diagnosis not present

## 2017-03-30 DIAGNOSIS — N2581 Secondary hyperparathyroidism of renal origin: Secondary | ICD-10-CM | POA: Diagnosis not present

## 2017-03-30 DIAGNOSIS — D631 Anemia in chronic kidney disease: Secondary | ICD-10-CM | POA: Diagnosis not present

## 2017-04-01 DIAGNOSIS — I739 Peripheral vascular disease, unspecified: Secondary | ICD-10-CM | POA: Diagnosis not present

## 2017-04-01 DIAGNOSIS — L89623 Pressure ulcer of left heel, stage 3: Secondary | ICD-10-CM | POA: Diagnosis not present

## 2017-04-01 DIAGNOSIS — E1142 Type 2 diabetes mellitus with diabetic polyneuropathy: Secondary | ICD-10-CM | POA: Diagnosis not present

## 2017-04-02 DIAGNOSIS — N2581 Secondary hyperparathyroidism of renal origin: Secondary | ICD-10-CM | POA: Diagnosis not present

## 2017-04-02 DIAGNOSIS — D631 Anemia in chronic kidney disease: Secondary | ICD-10-CM | POA: Diagnosis not present

## 2017-04-02 DIAGNOSIS — N186 End stage renal disease: Secondary | ICD-10-CM | POA: Diagnosis not present

## 2017-04-02 DIAGNOSIS — Z992 Dependence on renal dialysis: Secondary | ICD-10-CM | POA: Diagnosis not present

## 2017-04-02 DIAGNOSIS — D509 Iron deficiency anemia, unspecified: Secondary | ICD-10-CM | POA: Diagnosis not present

## 2017-04-04 DIAGNOSIS — N2581 Secondary hyperparathyroidism of renal origin: Secondary | ICD-10-CM | POA: Diagnosis not present

## 2017-04-04 DIAGNOSIS — D518 Other vitamin B12 deficiency anemias: Secondary | ICD-10-CM | POA: Diagnosis not present

## 2017-04-04 DIAGNOSIS — Z992 Dependence on renal dialysis: Secondary | ICD-10-CM | POA: Diagnosis not present

## 2017-04-04 DIAGNOSIS — Z79899 Other long term (current) drug therapy: Secondary | ICD-10-CM | POA: Diagnosis not present

## 2017-04-04 DIAGNOSIS — D509 Iron deficiency anemia, unspecified: Secondary | ICD-10-CM | POA: Diagnosis not present

## 2017-04-04 DIAGNOSIS — E782 Mixed hyperlipidemia: Secondary | ICD-10-CM | POA: Diagnosis not present

## 2017-04-04 DIAGNOSIS — E039 Hypothyroidism, unspecified: Secondary | ICD-10-CM | POA: Diagnosis not present

## 2017-04-04 DIAGNOSIS — N186 End stage renal disease: Secondary | ICD-10-CM | POA: Diagnosis not present

## 2017-04-04 DIAGNOSIS — D631 Anemia in chronic kidney disease: Secondary | ICD-10-CM | POA: Diagnosis not present

## 2017-04-04 DIAGNOSIS — E119 Type 2 diabetes mellitus without complications: Secondary | ICD-10-CM | POA: Diagnosis not present

## 2017-04-05 ENCOUNTER — Ambulatory Visit: Payer: Medicare Other | Admitting: Vascular Surgery

## 2017-04-06 DIAGNOSIS — N186 End stage renal disease: Secondary | ICD-10-CM | POA: Diagnosis not present

## 2017-04-06 DIAGNOSIS — Z992 Dependence on renal dialysis: Secondary | ICD-10-CM | POA: Diagnosis not present

## 2017-04-06 DIAGNOSIS — D509 Iron deficiency anemia, unspecified: Secondary | ICD-10-CM | POA: Diagnosis not present

## 2017-04-06 DIAGNOSIS — D631 Anemia in chronic kidney disease: Secondary | ICD-10-CM | POA: Diagnosis not present

## 2017-04-06 DIAGNOSIS — N2581 Secondary hyperparathyroidism of renal origin: Secondary | ICD-10-CM | POA: Diagnosis not present

## 2017-04-09 DIAGNOSIS — N186 End stage renal disease: Secondary | ICD-10-CM | POA: Diagnosis not present

## 2017-04-09 DIAGNOSIS — D631 Anemia in chronic kidney disease: Secondary | ICD-10-CM | POA: Diagnosis not present

## 2017-04-09 DIAGNOSIS — Z992 Dependence on renal dialysis: Secondary | ICD-10-CM | POA: Diagnosis not present

## 2017-04-09 DIAGNOSIS — D509 Iron deficiency anemia, unspecified: Secondary | ICD-10-CM | POA: Diagnosis not present

## 2017-04-09 DIAGNOSIS — N2581 Secondary hyperparathyroidism of renal origin: Secondary | ICD-10-CM | POA: Diagnosis not present

## 2017-04-11 DIAGNOSIS — D631 Anemia in chronic kidney disease: Secondary | ICD-10-CM | POA: Diagnosis not present

## 2017-04-11 DIAGNOSIS — Z992 Dependence on renal dialysis: Secondary | ICD-10-CM | POA: Diagnosis not present

## 2017-04-11 DIAGNOSIS — N186 End stage renal disease: Secondary | ICD-10-CM | POA: Diagnosis not present

## 2017-04-11 DIAGNOSIS — N2581 Secondary hyperparathyroidism of renal origin: Secondary | ICD-10-CM | POA: Diagnosis not present

## 2017-04-11 DIAGNOSIS — D509 Iron deficiency anemia, unspecified: Secondary | ICD-10-CM | POA: Diagnosis not present

## 2017-04-13 DIAGNOSIS — N186 End stage renal disease: Secondary | ICD-10-CM | POA: Diagnosis not present

## 2017-04-13 DIAGNOSIS — D631 Anemia in chronic kidney disease: Secondary | ICD-10-CM | POA: Diagnosis not present

## 2017-04-13 DIAGNOSIS — Z992 Dependence on renal dialysis: Secondary | ICD-10-CM | POA: Diagnosis not present

## 2017-04-13 DIAGNOSIS — D509 Iron deficiency anemia, unspecified: Secondary | ICD-10-CM | POA: Diagnosis not present

## 2017-04-13 DIAGNOSIS — N2581 Secondary hyperparathyroidism of renal origin: Secondary | ICD-10-CM | POA: Diagnosis not present

## 2017-04-16 DIAGNOSIS — N2581 Secondary hyperparathyroidism of renal origin: Secondary | ICD-10-CM | POA: Diagnosis not present

## 2017-04-16 DIAGNOSIS — D631 Anemia in chronic kidney disease: Secondary | ICD-10-CM | POA: Diagnosis not present

## 2017-04-16 DIAGNOSIS — N186 End stage renal disease: Secondary | ICD-10-CM | POA: Diagnosis not present

## 2017-04-16 DIAGNOSIS — Z992 Dependence on renal dialysis: Secondary | ICD-10-CM | POA: Diagnosis not present

## 2017-04-16 DIAGNOSIS — D509 Iron deficiency anemia, unspecified: Secondary | ICD-10-CM | POA: Diagnosis not present

## 2017-04-17 DIAGNOSIS — F339 Major depressive disorder, recurrent, unspecified: Secondary | ICD-10-CM | POA: Diagnosis not present

## 2017-04-17 DIAGNOSIS — F411 Generalized anxiety disorder: Secondary | ICD-10-CM | POA: Diagnosis not present

## 2017-04-17 DIAGNOSIS — F5104 Psychophysiologic insomnia: Secondary | ICD-10-CM | POA: Diagnosis not present

## 2017-04-17 DIAGNOSIS — N185 Chronic kidney disease, stage 5: Secondary | ICD-10-CM | POA: Diagnosis not present

## 2017-04-18 DIAGNOSIS — D509 Iron deficiency anemia, unspecified: Secondary | ICD-10-CM | POA: Diagnosis not present

## 2017-04-18 DIAGNOSIS — N186 End stage renal disease: Secondary | ICD-10-CM | POA: Diagnosis not present

## 2017-04-18 DIAGNOSIS — N2581 Secondary hyperparathyroidism of renal origin: Secondary | ICD-10-CM | POA: Diagnosis not present

## 2017-04-18 DIAGNOSIS — Z992 Dependence on renal dialysis: Secondary | ICD-10-CM | POA: Diagnosis not present

## 2017-04-18 DIAGNOSIS — D631 Anemia in chronic kidney disease: Secondary | ICD-10-CM | POA: Diagnosis not present

## 2017-04-20 DIAGNOSIS — D509 Iron deficiency anemia, unspecified: Secondary | ICD-10-CM | POA: Diagnosis not present

## 2017-04-20 DIAGNOSIS — N2581 Secondary hyperparathyroidism of renal origin: Secondary | ICD-10-CM | POA: Diagnosis not present

## 2017-04-20 DIAGNOSIS — D631 Anemia in chronic kidney disease: Secondary | ICD-10-CM | POA: Diagnosis not present

## 2017-04-20 DIAGNOSIS — Z992 Dependence on renal dialysis: Secondary | ICD-10-CM | POA: Diagnosis not present

## 2017-04-20 DIAGNOSIS — N186 End stage renal disease: Secondary | ICD-10-CM | POA: Diagnosis not present

## 2017-04-22 DIAGNOSIS — I739 Peripheral vascular disease, unspecified: Secondary | ICD-10-CM | POA: Diagnosis not present

## 2017-04-22 DIAGNOSIS — S91302A Unspecified open wound, left foot, initial encounter: Secondary | ICD-10-CM | POA: Diagnosis not present

## 2017-04-22 DIAGNOSIS — L89623 Pressure ulcer of left heel, stage 3: Secondary | ICD-10-CM | POA: Diagnosis not present

## 2017-04-22 DIAGNOSIS — E1140 Type 2 diabetes mellitus with diabetic neuropathy, unspecified: Secondary | ICD-10-CM | POA: Diagnosis not present

## 2017-04-23 DIAGNOSIS — N2581 Secondary hyperparathyroidism of renal origin: Secondary | ICD-10-CM | POA: Diagnosis not present

## 2017-04-23 DIAGNOSIS — N186 End stage renal disease: Secondary | ICD-10-CM | POA: Diagnosis not present

## 2017-04-23 DIAGNOSIS — Z992 Dependence on renal dialysis: Secondary | ICD-10-CM | POA: Diagnosis not present

## 2017-04-23 DIAGNOSIS — D631 Anemia in chronic kidney disease: Secondary | ICD-10-CM | POA: Diagnosis not present

## 2017-04-23 DIAGNOSIS — D509 Iron deficiency anemia, unspecified: Secondary | ICD-10-CM | POA: Diagnosis not present

## 2017-04-25 DIAGNOSIS — N186 End stage renal disease: Secondary | ICD-10-CM | POA: Diagnosis not present

## 2017-04-25 DIAGNOSIS — Z992 Dependence on renal dialysis: Secondary | ICD-10-CM | POA: Diagnosis not present

## 2017-04-25 DIAGNOSIS — D631 Anemia in chronic kidney disease: Secondary | ICD-10-CM | POA: Diagnosis not present

## 2017-04-25 DIAGNOSIS — N2581 Secondary hyperparathyroidism of renal origin: Secondary | ICD-10-CM | POA: Diagnosis not present

## 2017-04-25 DIAGNOSIS — D509 Iron deficiency anemia, unspecified: Secondary | ICD-10-CM | POA: Diagnosis not present

## 2017-04-27 DIAGNOSIS — N186 End stage renal disease: Secondary | ICD-10-CM | POA: Diagnosis not present

## 2017-04-27 DIAGNOSIS — N2581 Secondary hyperparathyroidism of renal origin: Secondary | ICD-10-CM | POA: Diagnosis not present

## 2017-04-27 DIAGNOSIS — D631 Anemia in chronic kidney disease: Secondary | ICD-10-CM | POA: Diagnosis not present

## 2017-04-27 DIAGNOSIS — Z992 Dependence on renal dialysis: Secondary | ICD-10-CM | POA: Diagnosis not present

## 2017-04-27 DIAGNOSIS — D509 Iron deficiency anemia, unspecified: Secondary | ICD-10-CM | POA: Diagnosis not present

## 2017-04-30 DIAGNOSIS — Z992 Dependence on renal dialysis: Secondary | ICD-10-CM | POA: Diagnosis not present

## 2017-04-30 DIAGNOSIS — N2581 Secondary hyperparathyroidism of renal origin: Secondary | ICD-10-CM | POA: Diagnosis not present

## 2017-04-30 DIAGNOSIS — N186 End stage renal disease: Secondary | ICD-10-CM | POA: Diagnosis not present

## 2017-04-30 DIAGNOSIS — D631 Anemia in chronic kidney disease: Secondary | ICD-10-CM | POA: Diagnosis not present

## 2017-04-30 DIAGNOSIS — D509 Iron deficiency anemia, unspecified: Secondary | ICD-10-CM | POA: Diagnosis not present

## 2017-05-02 ENCOUNTER — Encounter: Payer: Medicare Other | Admitting: Vascular Surgery

## 2017-05-02 DIAGNOSIS — Z7902 Long term (current) use of antithrombotics/antiplatelets: Secondary | ICD-10-CM | POA: Diagnosis not present

## 2017-05-02 DIAGNOSIS — N186 End stage renal disease: Secondary | ICD-10-CM | POA: Diagnosis not present

## 2017-05-02 DIAGNOSIS — I120 Hypertensive chronic kidney disease with stage 5 chronic kidney disease or end stage renal disease: Secondary | ICD-10-CM | POA: Diagnosis not present

## 2017-05-02 DIAGNOSIS — M47816 Spondylosis without myelopathy or radiculopathy, lumbar region: Secondary | ICD-10-CM | POA: Diagnosis not present

## 2017-05-02 DIAGNOSIS — Z8673 Personal history of transient ischemic attack (TIA), and cerebral infarction without residual deficits: Secondary | ICD-10-CM | POA: Diagnosis not present

## 2017-05-02 DIAGNOSIS — N2581 Secondary hyperparathyroidism of renal origin: Secondary | ICD-10-CM | POA: Diagnosis not present

## 2017-05-02 DIAGNOSIS — E1122 Type 2 diabetes mellitus with diabetic chronic kidney disease: Secondary | ICD-10-CM | POA: Diagnosis not present

## 2017-05-02 DIAGNOSIS — N185 Chronic kidney disease, stage 5: Secondary | ICD-10-CM | POA: Diagnosis not present

## 2017-05-02 DIAGNOSIS — L89623 Pressure ulcer of left heel, stage 3: Secondary | ICD-10-CM | POA: Diagnosis not present

## 2017-05-02 DIAGNOSIS — Z4800 Encounter for change or removal of nonsurgical wound dressing: Secondary | ICD-10-CM | POA: Diagnosis not present

## 2017-05-02 DIAGNOSIS — Z87891 Personal history of nicotine dependence: Secondary | ICD-10-CM | POA: Diagnosis not present

## 2017-05-02 DIAGNOSIS — F339 Major depressive disorder, recurrent, unspecified: Secondary | ICD-10-CM | POA: Diagnosis not present

## 2017-05-02 DIAGNOSIS — Z7951 Long term (current) use of inhaled steroids: Secondary | ICD-10-CM | POA: Diagnosis not present

## 2017-05-02 DIAGNOSIS — Z7982 Long term (current) use of aspirin: Secondary | ICD-10-CM | POA: Diagnosis not present

## 2017-05-02 DIAGNOSIS — Z992 Dependence on renal dialysis: Secondary | ICD-10-CM | POA: Diagnosis not present

## 2017-05-02 DIAGNOSIS — F411 Generalized anxiety disorder: Secondary | ICD-10-CM | POA: Diagnosis not present

## 2017-05-02 DIAGNOSIS — Z794 Long term (current) use of insulin: Secondary | ICD-10-CM | POA: Diagnosis not present

## 2017-05-02 DIAGNOSIS — E1151 Type 2 diabetes mellitus with diabetic peripheral angiopathy without gangrene: Secondary | ICD-10-CM | POA: Diagnosis not present

## 2017-05-02 DIAGNOSIS — D631 Anemia in chronic kidney disease: Secondary | ICD-10-CM | POA: Diagnosis not present

## 2017-05-02 DIAGNOSIS — G8929 Other chronic pain: Secondary | ICD-10-CM | POA: Diagnosis not present

## 2017-05-02 DIAGNOSIS — E1142 Type 2 diabetes mellitus with diabetic polyneuropathy: Secondary | ICD-10-CM | POA: Diagnosis not present

## 2017-05-02 DIAGNOSIS — D509 Iron deficiency anemia, unspecified: Secondary | ICD-10-CM | POA: Diagnosis not present

## 2017-05-03 DIAGNOSIS — L89623 Pressure ulcer of left heel, stage 3: Secondary | ICD-10-CM | POA: Diagnosis not present

## 2017-05-03 DIAGNOSIS — N185 Chronic kidney disease, stage 5: Secondary | ICD-10-CM | POA: Diagnosis not present

## 2017-05-03 DIAGNOSIS — I120 Hypertensive chronic kidney disease with stage 5 chronic kidney disease or end stage renal disease: Secondary | ICD-10-CM | POA: Diagnosis not present

## 2017-05-03 DIAGNOSIS — E1122 Type 2 diabetes mellitus with diabetic chronic kidney disease: Secondary | ICD-10-CM | POA: Diagnosis not present

## 2017-05-03 DIAGNOSIS — E1142 Type 2 diabetes mellitus with diabetic polyneuropathy: Secondary | ICD-10-CM | POA: Diagnosis not present

## 2017-05-03 DIAGNOSIS — E1151 Type 2 diabetes mellitus with diabetic peripheral angiopathy without gangrene: Secondary | ICD-10-CM | POA: Diagnosis not present

## 2017-05-04 DIAGNOSIS — D509 Iron deficiency anemia, unspecified: Secondary | ICD-10-CM | POA: Diagnosis not present

## 2017-05-04 DIAGNOSIS — N186 End stage renal disease: Secondary | ICD-10-CM | POA: Diagnosis not present

## 2017-05-04 DIAGNOSIS — Z992 Dependence on renal dialysis: Secondary | ICD-10-CM | POA: Diagnosis not present

## 2017-05-04 DIAGNOSIS — N2581 Secondary hyperparathyroidism of renal origin: Secondary | ICD-10-CM | POA: Diagnosis not present

## 2017-05-04 DIAGNOSIS — D631 Anemia in chronic kidney disease: Secondary | ICD-10-CM | POA: Diagnosis not present

## 2017-05-06 DIAGNOSIS — L89623 Pressure ulcer of left heel, stage 3: Secondary | ICD-10-CM | POA: Diagnosis not present

## 2017-05-06 DIAGNOSIS — E1151 Type 2 diabetes mellitus with diabetic peripheral angiopathy without gangrene: Secondary | ICD-10-CM | POA: Diagnosis not present

## 2017-05-06 DIAGNOSIS — E1142 Type 2 diabetes mellitus with diabetic polyneuropathy: Secondary | ICD-10-CM | POA: Diagnosis not present

## 2017-05-06 DIAGNOSIS — I120 Hypertensive chronic kidney disease with stage 5 chronic kidney disease or end stage renal disease: Secondary | ICD-10-CM | POA: Diagnosis not present

## 2017-05-06 DIAGNOSIS — N185 Chronic kidney disease, stage 5: Secondary | ICD-10-CM | POA: Diagnosis not present

## 2017-05-06 DIAGNOSIS — E1122 Type 2 diabetes mellitus with diabetic chronic kidney disease: Secondary | ICD-10-CM | POA: Diagnosis not present

## 2017-05-07 DIAGNOSIS — D509 Iron deficiency anemia, unspecified: Secondary | ICD-10-CM | POA: Diagnosis not present

## 2017-05-07 DIAGNOSIS — D631 Anemia in chronic kidney disease: Secondary | ICD-10-CM | POA: Diagnosis not present

## 2017-05-07 DIAGNOSIS — Z794 Long term (current) use of insulin: Secondary | ICD-10-CM | POA: Diagnosis not present

## 2017-05-07 DIAGNOSIS — Z992 Dependence on renal dialysis: Secondary | ICD-10-CM | POA: Diagnosis not present

## 2017-05-07 DIAGNOSIS — N186 End stage renal disease: Secondary | ICD-10-CM | POA: Diagnosis not present

## 2017-05-07 DIAGNOSIS — N2581 Secondary hyperparathyroidism of renal origin: Secondary | ICD-10-CM | POA: Diagnosis not present

## 2017-05-07 DIAGNOSIS — E119 Type 2 diabetes mellitus without complications: Secondary | ICD-10-CM | POA: Diagnosis not present

## 2017-05-08 DIAGNOSIS — E1122 Type 2 diabetes mellitus with diabetic chronic kidney disease: Secondary | ICD-10-CM | POA: Diagnosis not present

## 2017-05-08 DIAGNOSIS — E1151 Type 2 diabetes mellitus with diabetic peripheral angiopathy without gangrene: Secondary | ICD-10-CM | POA: Diagnosis not present

## 2017-05-08 DIAGNOSIS — L89623 Pressure ulcer of left heel, stage 3: Secondary | ICD-10-CM | POA: Diagnosis not present

## 2017-05-08 DIAGNOSIS — I120 Hypertensive chronic kidney disease with stage 5 chronic kidney disease or end stage renal disease: Secondary | ICD-10-CM | POA: Diagnosis not present

## 2017-05-08 DIAGNOSIS — N185 Chronic kidney disease, stage 5: Secondary | ICD-10-CM | POA: Diagnosis not present

## 2017-05-08 DIAGNOSIS — E1142 Type 2 diabetes mellitus with diabetic polyneuropathy: Secondary | ICD-10-CM | POA: Diagnosis not present

## 2017-05-09 DIAGNOSIS — N2581 Secondary hyperparathyroidism of renal origin: Secondary | ICD-10-CM | POA: Diagnosis not present

## 2017-05-09 DIAGNOSIS — N186 End stage renal disease: Secondary | ICD-10-CM | POA: Diagnosis not present

## 2017-05-09 DIAGNOSIS — D631 Anemia in chronic kidney disease: Secondary | ICD-10-CM | POA: Diagnosis not present

## 2017-05-09 DIAGNOSIS — Z992 Dependence on renal dialysis: Secondary | ICD-10-CM | POA: Diagnosis not present

## 2017-05-09 DIAGNOSIS — D509 Iron deficiency anemia, unspecified: Secondary | ICD-10-CM | POA: Diagnosis not present

## 2017-05-10 DIAGNOSIS — E1151 Type 2 diabetes mellitus with diabetic peripheral angiopathy without gangrene: Secondary | ICD-10-CM | POA: Diagnosis not present

## 2017-05-10 DIAGNOSIS — N185 Chronic kidney disease, stage 5: Secondary | ICD-10-CM | POA: Diagnosis not present

## 2017-05-10 DIAGNOSIS — I120 Hypertensive chronic kidney disease with stage 5 chronic kidney disease or end stage renal disease: Secondary | ICD-10-CM | POA: Diagnosis not present

## 2017-05-10 DIAGNOSIS — E1142 Type 2 diabetes mellitus with diabetic polyneuropathy: Secondary | ICD-10-CM | POA: Diagnosis not present

## 2017-05-10 DIAGNOSIS — E1122 Type 2 diabetes mellitus with diabetic chronic kidney disease: Secondary | ICD-10-CM | POA: Diagnosis not present

## 2017-05-10 DIAGNOSIS — Z6830 Body mass index (BMI) 30.0-30.9, adult: Secondary | ICD-10-CM | POA: Insufficient documentation

## 2017-05-10 DIAGNOSIS — L89623 Pressure ulcer of left heel, stage 3: Secondary | ICD-10-CM | POA: Diagnosis not present

## 2017-05-11 DIAGNOSIS — Z992 Dependence on renal dialysis: Secondary | ICD-10-CM | POA: Diagnosis not present

## 2017-05-11 DIAGNOSIS — N186 End stage renal disease: Secondary | ICD-10-CM | POA: Diagnosis not present

## 2017-05-11 DIAGNOSIS — D509 Iron deficiency anemia, unspecified: Secondary | ICD-10-CM | POA: Diagnosis not present

## 2017-05-11 DIAGNOSIS — N2581 Secondary hyperparathyroidism of renal origin: Secondary | ICD-10-CM | POA: Diagnosis not present

## 2017-05-11 DIAGNOSIS — D631 Anemia in chronic kidney disease: Secondary | ICD-10-CM | POA: Diagnosis not present

## 2017-05-12 DIAGNOSIS — Z992 Dependence on renal dialysis: Secondary | ICD-10-CM | POA: Diagnosis not present

## 2017-05-12 DIAGNOSIS — N186 End stage renal disease: Secondary | ICD-10-CM | POA: Diagnosis not present

## 2017-05-13 DIAGNOSIS — D631 Anemia in chronic kidney disease: Secondary | ICD-10-CM | POA: Diagnosis not present

## 2017-05-13 DIAGNOSIS — N185 Chronic kidney disease, stage 5: Secondary | ICD-10-CM | POA: Diagnosis not present

## 2017-05-13 DIAGNOSIS — Z992 Dependence on renal dialysis: Secondary | ICD-10-CM | POA: Diagnosis not present

## 2017-05-13 DIAGNOSIS — N186 End stage renal disease: Secondary | ICD-10-CM | POA: Diagnosis not present

## 2017-05-13 DIAGNOSIS — E1151 Type 2 diabetes mellitus with diabetic peripheral angiopathy without gangrene: Secondary | ICD-10-CM | POA: Diagnosis not present

## 2017-05-13 DIAGNOSIS — E1142 Type 2 diabetes mellitus with diabetic polyneuropathy: Secondary | ICD-10-CM | POA: Diagnosis not present

## 2017-05-13 DIAGNOSIS — D509 Iron deficiency anemia, unspecified: Secondary | ICD-10-CM | POA: Diagnosis not present

## 2017-05-13 DIAGNOSIS — E1122 Type 2 diabetes mellitus with diabetic chronic kidney disease: Secondary | ICD-10-CM | POA: Diagnosis not present

## 2017-05-13 DIAGNOSIS — N2581 Secondary hyperparathyroidism of renal origin: Secondary | ICD-10-CM | POA: Diagnosis not present

## 2017-05-13 DIAGNOSIS — I120 Hypertensive chronic kidney disease with stage 5 chronic kidney disease or end stage renal disease: Secondary | ICD-10-CM | POA: Diagnosis not present

## 2017-05-13 DIAGNOSIS — L89623 Pressure ulcer of left heel, stage 3: Secondary | ICD-10-CM | POA: Diagnosis not present

## 2017-05-14 DIAGNOSIS — Z992 Dependence on renal dialysis: Secondary | ICD-10-CM | POA: Diagnosis not present

## 2017-05-14 DIAGNOSIS — N2581 Secondary hyperparathyroidism of renal origin: Secondary | ICD-10-CM | POA: Diagnosis not present

## 2017-05-14 DIAGNOSIS — D509 Iron deficiency anemia, unspecified: Secondary | ICD-10-CM | POA: Diagnosis not present

## 2017-05-14 DIAGNOSIS — N186 End stage renal disease: Secondary | ICD-10-CM | POA: Diagnosis not present

## 2017-05-14 DIAGNOSIS — D631 Anemia in chronic kidney disease: Secondary | ICD-10-CM | POA: Diagnosis not present

## 2017-05-15 DIAGNOSIS — N185 Chronic kidney disease, stage 5: Secondary | ICD-10-CM | POA: Diagnosis not present

## 2017-05-15 DIAGNOSIS — L89623 Pressure ulcer of left heel, stage 3: Secondary | ICD-10-CM | POA: Diagnosis not present

## 2017-05-15 DIAGNOSIS — I120 Hypertensive chronic kidney disease with stage 5 chronic kidney disease or end stage renal disease: Secondary | ICD-10-CM | POA: Diagnosis not present

## 2017-05-15 DIAGNOSIS — E1151 Type 2 diabetes mellitus with diabetic peripheral angiopathy without gangrene: Secondary | ICD-10-CM | POA: Diagnosis not present

## 2017-05-15 DIAGNOSIS — E1142 Type 2 diabetes mellitus with diabetic polyneuropathy: Secondary | ICD-10-CM | POA: Diagnosis not present

## 2017-05-15 DIAGNOSIS — E1122 Type 2 diabetes mellitus with diabetic chronic kidney disease: Secondary | ICD-10-CM | POA: Diagnosis not present

## 2017-05-16 DIAGNOSIS — D631 Anemia in chronic kidney disease: Secondary | ICD-10-CM | POA: Diagnosis not present

## 2017-05-16 DIAGNOSIS — N186 End stage renal disease: Secondary | ICD-10-CM | POA: Diagnosis not present

## 2017-05-16 DIAGNOSIS — N2581 Secondary hyperparathyroidism of renal origin: Secondary | ICD-10-CM | POA: Diagnosis not present

## 2017-05-16 DIAGNOSIS — Z992 Dependence on renal dialysis: Secondary | ICD-10-CM | POA: Diagnosis not present

## 2017-05-16 DIAGNOSIS — D509 Iron deficiency anemia, unspecified: Secondary | ICD-10-CM | POA: Diagnosis not present

## 2017-05-17 DIAGNOSIS — I120 Hypertensive chronic kidney disease with stage 5 chronic kidney disease or end stage renal disease: Secondary | ICD-10-CM | POA: Diagnosis not present

## 2017-05-17 DIAGNOSIS — N185 Chronic kidney disease, stage 5: Secondary | ICD-10-CM | POA: Diagnosis not present

## 2017-05-17 DIAGNOSIS — E1151 Type 2 diabetes mellitus with diabetic peripheral angiopathy without gangrene: Secondary | ICD-10-CM | POA: Diagnosis not present

## 2017-05-17 DIAGNOSIS — E1122 Type 2 diabetes mellitus with diabetic chronic kidney disease: Secondary | ICD-10-CM | POA: Diagnosis not present

## 2017-05-17 DIAGNOSIS — L89623 Pressure ulcer of left heel, stage 3: Secondary | ICD-10-CM | POA: Diagnosis not present

## 2017-05-17 DIAGNOSIS — E1142 Type 2 diabetes mellitus with diabetic polyneuropathy: Secondary | ICD-10-CM | POA: Diagnosis not present

## 2017-05-18 DIAGNOSIS — N186 End stage renal disease: Secondary | ICD-10-CM | POA: Diagnosis not present

## 2017-05-18 DIAGNOSIS — D631 Anemia in chronic kidney disease: Secondary | ICD-10-CM | POA: Diagnosis not present

## 2017-05-18 DIAGNOSIS — D509 Iron deficiency anemia, unspecified: Secondary | ICD-10-CM | POA: Diagnosis not present

## 2017-05-18 DIAGNOSIS — N2581 Secondary hyperparathyroidism of renal origin: Secondary | ICD-10-CM | POA: Diagnosis not present

## 2017-05-18 DIAGNOSIS — Z992 Dependence on renal dialysis: Secondary | ICD-10-CM | POA: Diagnosis not present

## 2017-05-21 DIAGNOSIS — D631 Anemia in chronic kidney disease: Secondary | ICD-10-CM | POA: Diagnosis not present

## 2017-05-21 DIAGNOSIS — D509 Iron deficiency anemia, unspecified: Secondary | ICD-10-CM | POA: Diagnosis not present

## 2017-05-21 DIAGNOSIS — N2581 Secondary hyperparathyroidism of renal origin: Secondary | ICD-10-CM | POA: Diagnosis not present

## 2017-05-21 DIAGNOSIS — N186 End stage renal disease: Secondary | ICD-10-CM | POA: Diagnosis not present

## 2017-05-21 DIAGNOSIS — Z992 Dependence on renal dialysis: Secondary | ICD-10-CM | POA: Diagnosis not present

## 2017-05-23 DIAGNOSIS — Z992 Dependence on renal dialysis: Secondary | ICD-10-CM | POA: Diagnosis not present

## 2017-05-23 DIAGNOSIS — N2581 Secondary hyperparathyroidism of renal origin: Secondary | ICD-10-CM | POA: Diagnosis not present

## 2017-05-23 DIAGNOSIS — D631 Anemia in chronic kidney disease: Secondary | ICD-10-CM | POA: Diagnosis not present

## 2017-05-23 DIAGNOSIS — N186 End stage renal disease: Secondary | ICD-10-CM | POA: Diagnosis not present

## 2017-05-23 DIAGNOSIS — D509 Iron deficiency anemia, unspecified: Secondary | ICD-10-CM | POA: Diagnosis not present

## 2017-05-24 DIAGNOSIS — E1151 Type 2 diabetes mellitus with diabetic peripheral angiopathy without gangrene: Secondary | ICD-10-CM | POA: Diagnosis not present

## 2017-05-24 DIAGNOSIS — E1122 Type 2 diabetes mellitus with diabetic chronic kidney disease: Secondary | ICD-10-CM | POA: Diagnosis not present

## 2017-05-24 DIAGNOSIS — N185 Chronic kidney disease, stage 5: Secondary | ICD-10-CM | POA: Diagnosis not present

## 2017-05-24 DIAGNOSIS — E1142 Type 2 diabetes mellitus with diabetic polyneuropathy: Secondary | ICD-10-CM | POA: Diagnosis not present

## 2017-05-24 DIAGNOSIS — I120 Hypertensive chronic kidney disease with stage 5 chronic kidney disease or end stage renal disease: Secondary | ICD-10-CM | POA: Diagnosis not present

## 2017-05-24 DIAGNOSIS — L89623 Pressure ulcer of left heel, stage 3: Secondary | ICD-10-CM | POA: Diagnosis not present

## 2017-05-25 DIAGNOSIS — N186 End stage renal disease: Secondary | ICD-10-CM | POA: Diagnosis not present

## 2017-05-25 DIAGNOSIS — D631 Anemia in chronic kidney disease: Secondary | ICD-10-CM | POA: Diagnosis not present

## 2017-05-25 DIAGNOSIS — D509 Iron deficiency anemia, unspecified: Secondary | ICD-10-CM | POA: Diagnosis not present

## 2017-05-25 DIAGNOSIS — Z992 Dependence on renal dialysis: Secondary | ICD-10-CM | POA: Diagnosis not present

## 2017-05-25 DIAGNOSIS — N2581 Secondary hyperparathyroidism of renal origin: Secondary | ICD-10-CM | POA: Diagnosis not present

## 2017-05-28 DIAGNOSIS — D509 Iron deficiency anemia, unspecified: Secondary | ICD-10-CM | POA: Diagnosis not present

## 2017-05-28 DIAGNOSIS — Z992 Dependence on renal dialysis: Secondary | ICD-10-CM | POA: Diagnosis not present

## 2017-05-28 DIAGNOSIS — N186 End stage renal disease: Secondary | ICD-10-CM | POA: Diagnosis not present

## 2017-05-28 DIAGNOSIS — N2581 Secondary hyperparathyroidism of renal origin: Secondary | ICD-10-CM | POA: Diagnosis not present

## 2017-05-28 DIAGNOSIS — D631 Anemia in chronic kidney disease: Secondary | ICD-10-CM | POA: Diagnosis not present

## 2017-05-29 DIAGNOSIS — E1122 Type 2 diabetes mellitus with diabetic chronic kidney disease: Secondary | ICD-10-CM | POA: Diagnosis not present

## 2017-05-29 DIAGNOSIS — E1142 Type 2 diabetes mellitus with diabetic polyneuropathy: Secondary | ICD-10-CM | POA: Diagnosis not present

## 2017-05-29 DIAGNOSIS — E1151 Type 2 diabetes mellitus with diabetic peripheral angiopathy without gangrene: Secondary | ICD-10-CM | POA: Diagnosis not present

## 2017-05-29 DIAGNOSIS — N185 Chronic kidney disease, stage 5: Secondary | ICD-10-CM | POA: Diagnosis not present

## 2017-05-29 DIAGNOSIS — L89623 Pressure ulcer of left heel, stage 3: Secondary | ICD-10-CM | POA: Diagnosis not present

## 2017-05-29 DIAGNOSIS — I120 Hypertensive chronic kidney disease with stage 5 chronic kidney disease or end stage renal disease: Secondary | ICD-10-CM | POA: Diagnosis not present

## 2017-05-30 DIAGNOSIS — N186 End stage renal disease: Secondary | ICD-10-CM | POA: Diagnosis not present

## 2017-05-30 DIAGNOSIS — Z992 Dependence on renal dialysis: Secondary | ICD-10-CM | POA: Diagnosis not present

## 2017-05-30 DIAGNOSIS — D509 Iron deficiency anemia, unspecified: Secondary | ICD-10-CM | POA: Diagnosis not present

## 2017-05-30 DIAGNOSIS — N2581 Secondary hyperparathyroidism of renal origin: Secondary | ICD-10-CM | POA: Diagnosis not present

## 2017-05-30 DIAGNOSIS — D631 Anemia in chronic kidney disease: Secondary | ICD-10-CM | POA: Diagnosis not present

## 2017-05-31 DIAGNOSIS — N185 Chronic kidney disease, stage 5: Secondary | ICD-10-CM | POA: Diagnosis not present

## 2017-05-31 DIAGNOSIS — I10 Essential (primary) hypertension: Secondary | ICD-10-CM | POA: Diagnosis not present

## 2017-05-31 DIAGNOSIS — I714 Abdominal aortic aneurysm, without rupture: Secondary | ICD-10-CM | POA: Diagnosis not present

## 2017-05-31 DIAGNOSIS — G9009 Other idiopathic peripheral autonomic neuropathy: Secondary | ICD-10-CM | POA: Diagnosis not present

## 2017-05-31 DIAGNOSIS — I700 Atherosclerosis of aorta: Secondary | ICD-10-CM | POA: Diagnosis not present

## 2017-05-31 DIAGNOSIS — J449 Chronic obstructive pulmonary disease, unspecified: Secondary | ICD-10-CM | POA: Diagnosis not present

## 2017-05-31 DIAGNOSIS — I739 Peripheral vascular disease, unspecified: Secondary | ICD-10-CM | POA: Diagnosis not present

## 2017-06-01 DIAGNOSIS — D509 Iron deficiency anemia, unspecified: Secondary | ICD-10-CM | POA: Diagnosis not present

## 2017-06-01 DIAGNOSIS — N186 End stage renal disease: Secondary | ICD-10-CM | POA: Diagnosis not present

## 2017-06-01 DIAGNOSIS — N2581 Secondary hyperparathyroidism of renal origin: Secondary | ICD-10-CM | POA: Diagnosis not present

## 2017-06-01 DIAGNOSIS — D631 Anemia in chronic kidney disease: Secondary | ICD-10-CM | POA: Diagnosis not present

## 2017-06-01 DIAGNOSIS — Z992 Dependence on renal dialysis: Secondary | ICD-10-CM | POA: Diagnosis not present

## 2017-06-04 DIAGNOSIS — N186 End stage renal disease: Secondary | ICD-10-CM | POA: Diagnosis not present

## 2017-06-04 DIAGNOSIS — D509 Iron deficiency anemia, unspecified: Secondary | ICD-10-CM | POA: Diagnosis not present

## 2017-06-04 DIAGNOSIS — N2581 Secondary hyperparathyroidism of renal origin: Secondary | ICD-10-CM | POA: Diagnosis not present

## 2017-06-04 DIAGNOSIS — D631 Anemia in chronic kidney disease: Secondary | ICD-10-CM | POA: Diagnosis not present

## 2017-06-04 DIAGNOSIS — Z992 Dependence on renal dialysis: Secondary | ICD-10-CM | POA: Diagnosis not present

## 2017-06-06 DIAGNOSIS — N186 End stage renal disease: Secondary | ICD-10-CM | POA: Diagnosis not present

## 2017-06-06 DIAGNOSIS — D631 Anemia in chronic kidney disease: Secondary | ICD-10-CM | POA: Diagnosis not present

## 2017-06-06 DIAGNOSIS — N2581 Secondary hyperparathyroidism of renal origin: Secondary | ICD-10-CM | POA: Diagnosis not present

## 2017-06-06 DIAGNOSIS — Z992 Dependence on renal dialysis: Secondary | ICD-10-CM | POA: Diagnosis not present

## 2017-06-06 DIAGNOSIS — D509 Iron deficiency anemia, unspecified: Secondary | ICD-10-CM | POA: Diagnosis not present

## 2017-06-07 DIAGNOSIS — N185 Chronic kidney disease, stage 5: Secondary | ICD-10-CM | POA: Diagnosis not present

## 2017-06-07 DIAGNOSIS — I120 Hypertensive chronic kidney disease with stage 5 chronic kidney disease or end stage renal disease: Secondary | ICD-10-CM | POA: Diagnosis not present

## 2017-06-07 DIAGNOSIS — E1122 Type 2 diabetes mellitus with diabetic chronic kidney disease: Secondary | ICD-10-CM | POA: Diagnosis not present

## 2017-06-07 DIAGNOSIS — E1151 Type 2 diabetes mellitus with diabetic peripheral angiopathy without gangrene: Secondary | ICD-10-CM | POA: Diagnosis not present

## 2017-06-07 DIAGNOSIS — L89623 Pressure ulcer of left heel, stage 3: Secondary | ICD-10-CM | POA: Diagnosis not present

## 2017-06-07 DIAGNOSIS — E1142 Type 2 diabetes mellitus with diabetic polyneuropathy: Secondary | ICD-10-CM | POA: Diagnosis not present

## 2017-06-08 DIAGNOSIS — Z992 Dependence on renal dialysis: Secondary | ICD-10-CM | POA: Diagnosis not present

## 2017-06-08 DIAGNOSIS — D509 Iron deficiency anemia, unspecified: Secondary | ICD-10-CM | POA: Diagnosis not present

## 2017-06-08 DIAGNOSIS — D631 Anemia in chronic kidney disease: Secondary | ICD-10-CM | POA: Diagnosis not present

## 2017-06-08 DIAGNOSIS — N186 End stage renal disease: Secondary | ICD-10-CM | POA: Diagnosis not present

## 2017-06-08 DIAGNOSIS — N2581 Secondary hyperparathyroidism of renal origin: Secondary | ICD-10-CM | POA: Diagnosis not present

## 2017-06-10 DIAGNOSIS — L89623 Pressure ulcer of left heel, stage 3: Secondary | ICD-10-CM | POA: Diagnosis not present

## 2017-06-10 DIAGNOSIS — I120 Hypertensive chronic kidney disease with stage 5 chronic kidney disease or end stage renal disease: Secondary | ICD-10-CM | POA: Diagnosis not present

## 2017-06-10 DIAGNOSIS — E1142 Type 2 diabetes mellitus with diabetic polyneuropathy: Secondary | ICD-10-CM | POA: Diagnosis not present

## 2017-06-10 DIAGNOSIS — E1122 Type 2 diabetes mellitus with diabetic chronic kidney disease: Secondary | ICD-10-CM | POA: Diagnosis not present

## 2017-06-10 DIAGNOSIS — N185 Chronic kidney disease, stage 5: Secondary | ICD-10-CM | POA: Diagnosis not present

## 2017-06-10 DIAGNOSIS — E1151 Type 2 diabetes mellitus with diabetic peripheral angiopathy without gangrene: Secondary | ICD-10-CM | POA: Diagnosis not present

## 2017-06-11 DIAGNOSIS — D509 Iron deficiency anemia, unspecified: Secondary | ICD-10-CM | POA: Diagnosis not present

## 2017-06-11 DIAGNOSIS — N2581 Secondary hyperparathyroidism of renal origin: Secondary | ICD-10-CM | POA: Diagnosis not present

## 2017-06-11 DIAGNOSIS — N186 End stage renal disease: Secondary | ICD-10-CM | POA: Diagnosis not present

## 2017-06-11 DIAGNOSIS — Z992 Dependence on renal dialysis: Secondary | ICD-10-CM | POA: Diagnosis not present

## 2017-06-11 DIAGNOSIS — D631 Anemia in chronic kidney disease: Secondary | ICD-10-CM | POA: Diagnosis not present

## 2017-06-12 ENCOUNTER — Encounter: Payer: Self-pay | Admitting: Cardiovascular Disease

## 2017-06-12 ENCOUNTER — Ambulatory Visit (INDEPENDENT_AMBULATORY_CARE_PROVIDER_SITE_OTHER): Payer: Medicare Other | Admitting: Cardiovascular Disease

## 2017-06-12 DIAGNOSIS — I70229 Atherosclerosis of native arteries of extremities with rest pain, unspecified extremity: Secondary | ICD-10-CM

## 2017-06-12 DIAGNOSIS — I998 Other disorder of circulatory system: Secondary | ICD-10-CM | POA: Diagnosis not present

## 2017-06-12 NOTE — Patient Instructions (Addendum)
Medication Instructions: Your physician recommends that you continue on your current medications as directed. Please refer to the Current Medication list given to you today.  START Aspirin 81 mg daily   Testing/Procedures: Please have follow-up LEA/ABI dopplers in January 2019--6 months from the ones you had in July.   Follow-Up: Your physician wants you to follow-up in: 6 months with Dr. Gwenlyn Found. You will receive a reminder letter in the mail two months in advance. If you don't receive a letter, please call our office to schedule the follow-up appointment.  If you need a refill on your cardiac medications before your next appointment, please call your pharmacy.

## 2017-06-12 NOTE — Progress Notes (Signed)
06/12/2017 [name]   Jul 31, 1951  [id number]  Primary Physician Caryl Bis, MD Primary Cardiologist: Clayton Harp MD [name], [place]  HPI:  [name] is a 66 y.o. male referred by [facility] in [place] for evaluation of peripheral arterial disease. I last saw him in the office 03/08/17. He has a history of diabetes and hyperlipidemia. He stopped smoking 5 years ago. He has never had a heart attack or stroke. 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 He is currently in a rehabilitation facility. He just completed 5 weeks of intravenous antibiotics. He has a significant left heel ulcer with a right heel ulcer that has since healed. He did have Dopplers performed in the rehabilitation Center that showed significant peripheral vascular disease. he underwent peripheral angiography and intervention by myself on 02/11/17 at which time I intervened on a left mid and distal SFA stenosis with a favorable angiographic result. He did have 3 vessel runoff. Unfortunately that evening he had a right groin bleed requiring surgical intervention by Dr. [name]. His left heel wound has healed nicely over the last several weeks. Since I saw him and in the office last Tuesday is returned home. She's gained 15 pounds from "couldn't home cooking". He continues to dress his wound on a weekly basis and is seen by wound care nurse. Follow-up Doppler showed improvement in his left SFA velocities.   Current Meds  Medication Sig  . ARTIFICIAL TEAR OP Place 1 drop into both eyes every 6 (six) hours as needed (dry eyes).  Marland Kitchen atorvastatin (LIPITOR) 80 MG tablet Take 80 mg by mouth at bedtime.  . calcium acetate (PHOSLO) 667 MG capsule Take 2 capsules (1,334 mg total)  by mouth 3 (three) times daily with meals.  . cinacalcet (SENSIPAR) 30 MG tablet Take 30 mg by mouth every evening.  . collagenase (SANTYL) ointment Apply 1 application topically daily.  Marland Kitchen levothyroxine (SYNTHROID, LEVOTHROID) 125 MCG tablet Take 125 mcg by mouth daily before breakfast.  . lisinopril (PRINIVIL,ZESTRIL) 10 MG tablet Take 10 mg by mouth daily.  Marland Kitchen LYRICA 50 MG capsule Take 1 tablet by mouth 3 (three) times daily.  . nortriptyline (PAMELOR) 25 MG capsule Take 50 mg by mouth at bedtime.   . polyethylene glycol (MIRALAX / GLYCOLAX) packet Take 17 g by mouth daily.  . risperiDONE (RISPERDAL) 0.25 MG tablet Take 0.25 mg by mouth daily.  Marland Kitchen SALINE NASAL SPRAY NA Place 2 sprays into both nostrils as needed (seasonal allergies).  [name] 300 UNIT/ML SOPN Inject 36 Units as directed at bedtime.  . traZODone (DESYREL) 50 MG tablet Take 50 mg by mouth daily.      Allergies  Allergen Reactions  . Morphine And Related Other (See Comments)    Not effective  . Zaroxolyn [Metolazone]     Social History   Social History  . Marital status: Married    Spouse name: N/A  . Number of children: N/A  . Years of education: N/A   Occupational History  . Not on file.   Social History Main Topics  . Smoking status: Former Smoker    Years: 10.00    Types: Cigars    Quit date: 03/13/2012  . Smokeless tobacco: Never  Used  . Alcohol use No  . Drug use: No  . Sexual activity: No   Other Topics Concern  . Not on file   Social History Narrative  . No narrative on file     Review of Systems: General: negative for chills, fever, night sweats or weight changes.  Cardiovascular: negative for chest pain, dyspnea on exertion, edema, orthopnea, palpitations, paroxysmal nocturnal dyspnea or shortness of breath Dermatological: negative for rash Respiratory: negative for cough or wheezing Urologic: negative for hematuria Abdominal: negative for nausea, vomiting, diarrhea, bright red  blood per rectum, melena, or hematemesis Neurologic: negative for visual changes, syncope, or dizziness All other systems reviewed and are otherwise negative except as noted above.    Blood pressure 117/67, pulse 77, height 6\' 4"  (1.93 m), weight 250 lb 9.6 oz (113.7 kg), SpO2 98 %.  General appearance: alert and no distress Neck: no adenopathy, no carotid bruit, no JVD, supple, symmetrical, trachea midline and thyroid not enlarged, symmetric, no tenderness/mass/nodules Lungs: clear to auscultation bilaterally Heart: regular rate and rhythm, S1, S2 normal, no murmur, click, rub or gallop Extremities: extremities normal, atraumatic, no cyanosis or edema Pulses: 2+ and symmetric Skin: Skin color, texture, turgor normal. No rashes or lesions Neurologic: Alert and oriented X 3, normal strength and tone. Normal symmetric reflexes. Normal coordination and gait  EKG not performed today  ASSESSMENT AND PLAN:   Critical lower limb ischemia [name] returns for follow-up of his recent endovascular procedure performed on 02/11/17 for critical limb ischemia. He had a nonhealing left heel wound. I performed angiography on 02/11/17 revealing a 95% mid and 90% distal left SFA stenosis which I intervened on with an excellent answer graphic result. He did have 3 vessel runoff. His follow-up Doppler showed marked improvement in his velocities. This left heel wound has markedly improved with aggressive local wound care since that time we will continue to get every 6 month Dopplers and I'll see him back in 6 months for follow-up.      Clayton Harp MD FACP,FACC,FAHA, FSCAI 06/12/2017 10:30 AM

## 2017-06-12 NOTE — Assessment & Plan Note (Signed)
[name] returns for follow-up of his recent endovascular procedure performed on 02/11/17 for critical limb ischemia. He had a nonhealing left heel wound. I performed angiography on 02/11/17 revealing a 95% mid and 90% distal left SFA stenosis which I intervened on with an excellent answer graphic result. He did have 3 vessel runoff. His follow-up Doppler showed marked improvement in his velocities. This left heel wound has markedly improved with aggressive local wound care since that time we will continue to get every 6 month Dopplers and I'll see him back in 6 months for follow-up.

## 2017-06-13 DIAGNOSIS — N2581 Secondary hyperparathyroidism of renal origin: Secondary | ICD-10-CM | POA: Diagnosis not present

## 2017-06-13 DIAGNOSIS — D509 Iron deficiency anemia, unspecified: Secondary | ICD-10-CM | POA: Diagnosis not present

## 2017-06-13 DIAGNOSIS — N186 End stage renal disease: Secondary | ICD-10-CM | POA: Diagnosis not present

## 2017-06-13 DIAGNOSIS — D631 Anemia in chronic kidney disease: Secondary | ICD-10-CM | POA: Diagnosis not present

## 2017-06-13 DIAGNOSIS — Z992 Dependence on renal dialysis: Secondary | ICD-10-CM | POA: Diagnosis not present

## 2017-06-15 DIAGNOSIS — Z992 Dependence on renal dialysis: Secondary | ICD-10-CM | POA: Diagnosis not present

## 2017-06-15 DIAGNOSIS — N2581 Secondary hyperparathyroidism of renal origin: Secondary | ICD-10-CM | POA: Diagnosis not present

## 2017-06-15 DIAGNOSIS — D509 Iron deficiency anemia, unspecified: Secondary | ICD-10-CM | POA: Diagnosis not present

## 2017-06-15 DIAGNOSIS — D631 Anemia in chronic kidney disease: Secondary | ICD-10-CM | POA: Diagnosis not present

## 2017-06-15 DIAGNOSIS — N186 End stage renal disease: Secondary | ICD-10-CM | POA: Diagnosis not present

## 2017-06-17 DIAGNOSIS — N185 Chronic kidney disease, stage 5: Secondary | ICD-10-CM | POA: Diagnosis not present

## 2017-06-17 DIAGNOSIS — I120 Hypertensive chronic kidney disease with stage 5 chronic kidney disease or end stage renal disease: Secondary | ICD-10-CM | POA: Diagnosis not present

## 2017-06-17 DIAGNOSIS — E1122 Type 2 diabetes mellitus with diabetic chronic kidney disease: Secondary | ICD-10-CM | POA: Diagnosis not present

## 2017-06-17 DIAGNOSIS — E1142 Type 2 diabetes mellitus with diabetic polyneuropathy: Secondary | ICD-10-CM | POA: Diagnosis not present

## 2017-06-17 DIAGNOSIS — L89623 Pressure ulcer of left heel, stage 3: Secondary | ICD-10-CM | POA: Diagnosis not present

## 2017-06-17 DIAGNOSIS — E1151 Type 2 diabetes mellitus with diabetic peripheral angiopathy without gangrene: Secondary | ICD-10-CM | POA: Diagnosis not present

## 2017-06-18 DIAGNOSIS — N186 End stage renal disease: Secondary | ICD-10-CM | POA: Diagnosis not present

## 2017-06-18 DIAGNOSIS — N2581 Secondary hyperparathyroidism of renal origin: Secondary | ICD-10-CM | POA: Diagnosis not present

## 2017-06-18 DIAGNOSIS — D509 Iron deficiency anemia, unspecified: Secondary | ICD-10-CM | POA: Diagnosis not present

## 2017-06-18 DIAGNOSIS — Z992 Dependence on renal dialysis: Secondary | ICD-10-CM | POA: Diagnosis not present

## 2017-06-18 DIAGNOSIS — D631 Anemia in chronic kidney disease: Secondary | ICD-10-CM | POA: Diagnosis not present

## 2017-06-20 DIAGNOSIS — N186 End stage renal disease: Secondary | ICD-10-CM | POA: Diagnosis not present

## 2017-06-20 DIAGNOSIS — N2581 Secondary hyperparathyroidism of renal origin: Secondary | ICD-10-CM | POA: Diagnosis not present

## 2017-06-20 DIAGNOSIS — D631 Anemia in chronic kidney disease: Secondary | ICD-10-CM | POA: Diagnosis not present

## 2017-06-20 DIAGNOSIS — Z992 Dependence on renal dialysis: Secondary | ICD-10-CM | POA: Diagnosis not present

## 2017-06-20 DIAGNOSIS — D509 Iron deficiency anemia, unspecified: Secondary | ICD-10-CM | POA: Diagnosis not present

## 2017-06-22 DIAGNOSIS — D631 Anemia in chronic kidney disease: Secondary | ICD-10-CM | POA: Diagnosis not present

## 2017-06-22 DIAGNOSIS — N2581 Secondary hyperparathyroidism of renal origin: Secondary | ICD-10-CM | POA: Diagnosis not present

## 2017-06-22 DIAGNOSIS — N186 End stage renal disease: Secondary | ICD-10-CM | POA: Diagnosis not present

## 2017-06-22 DIAGNOSIS — D509 Iron deficiency anemia, unspecified: Secondary | ICD-10-CM | POA: Diagnosis not present

## 2017-06-22 DIAGNOSIS — Z992 Dependence on renal dialysis: Secondary | ICD-10-CM | POA: Diagnosis not present

## 2017-06-25 DIAGNOSIS — D631 Anemia in chronic kidney disease: Secondary | ICD-10-CM | POA: Diagnosis not present

## 2017-06-25 DIAGNOSIS — D509 Iron deficiency anemia, unspecified: Secondary | ICD-10-CM | POA: Diagnosis not present

## 2017-06-25 DIAGNOSIS — Z992 Dependence on renal dialysis: Secondary | ICD-10-CM | POA: Diagnosis not present

## 2017-06-25 DIAGNOSIS — N2581 Secondary hyperparathyroidism of renal origin: Secondary | ICD-10-CM | POA: Diagnosis not present

## 2017-06-25 DIAGNOSIS — N186 End stage renal disease: Secondary | ICD-10-CM | POA: Diagnosis not present

## 2017-06-26 DIAGNOSIS — E1151 Type 2 diabetes mellitus with diabetic peripheral angiopathy without gangrene: Secondary | ICD-10-CM | POA: Diagnosis not present

## 2017-06-26 DIAGNOSIS — N185 Chronic kidney disease, stage 5: Secondary | ICD-10-CM | POA: Diagnosis not present

## 2017-06-26 DIAGNOSIS — E1142 Type 2 diabetes mellitus with diabetic polyneuropathy: Secondary | ICD-10-CM | POA: Diagnosis not present

## 2017-06-26 DIAGNOSIS — E1122 Type 2 diabetes mellitus with diabetic chronic kidney disease: Secondary | ICD-10-CM | POA: Diagnosis not present

## 2017-06-26 DIAGNOSIS — I120 Hypertensive chronic kidney disease with stage 5 chronic kidney disease or end stage renal disease: Secondary | ICD-10-CM | POA: Diagnosis not present

## 2017-06-26 DIAGNOSIS — L89623 Pressure ulcer of left heel, stage 3: Secondary | ICD-10-CM | POA: Diagnosis not present

## 2017-06-27 DIAGNOSIS — Z992 Dependence on renal dialysis: Secondary | ICD-10-CM | POA: Diagnosis not present

## 2017-06-27 DIAGNOSIS — N2581 Secondary hyperparathyroidism of renal origin: Secondary | ICD-10-CM | POA: Diagnosis not present

## 2017-06-27 DIAGNOSIS — D509 Iron deficiency anemia, unspecified: Secondary | ICD-10-CM | POA: Diagnosis not present

## 2017-06-27 DIAGNOSIS — D631 Anemia in chronic kidney disease: Secondary | ICD-10-CM | POA: Diagnosis not present

## 2017-06-27 DIAGNOSIS — N186 End stage renal disease: Secondary | ICD-10-CM | POA: Diagnosis not present

## 2017-06-29 DIAGNOSIS — D631 Anemia in chronic kidney disease: Secondary | ICD-10-CM | POA: Diagnosis not present

## 2017-06-29 DIAGNOSIS — D509 Iron deficiency anemia, unspecified: Secondary | ICD-10-CM | POA: Diagnosis not present

## 2017-06-29 DIAGNOSIS — Z992 Dependence on renal dialysis: Secondary | ICD-10-CM | POA: Diagnosis not present

## 2017-06-29 DIAGNOSIS — N2581 Secondary hyperparathyroidism of renal origin: Secondary | ICD-10-CM | POA: Diagnosis not present

## 2017-06-29 DIAGNOSIS — N186 End stage renal disease: Secondary | ICD-10-CM | POA: Diagnosis not present

## 2017-07-01 DIAGNOSIS — Z4800 Encounter for change or removal of nonsurgical wound dressing: Secondary | ICD-10-CM | POA: Diagnosis not present

## 2017-07-01 DIAGNOSIS — G8929 Other chronic pain: Secondary | ICD-10-CM | POA: Diagnosis not present

## 2017-07-01 DIAGNOSIS — Z7951 Long term (current) use of inhaled steroids: Secondary | ICD-10-CM | POA: Diagnosis not present

## 2017-07-01 DIAGNOSIS — Z7902 Long term (current) use of antithrombotics/antiplatelets: Secondary | ICD-10-CM | POA: Diagnosis not present

## 2017-07-01 DIAGNOSIS — Z794 Long term (current) use of insulin: Secondary | ICD-10-CM | POA: Diagnosis not present

## 2017-07-01 DIAGNOSIS — Z8673 Personal history of transient ischemic attack (TIA), and cerebral infarction without residual deficits: Secondary | ICD-10-CM | POA: Diagnosis not present

## 2017-07-01 DIAGNOSIS — N185 Chronic kidney disease, stage 5: Secondary | ICD-10-CM | POA: Diagnosis not present

## 2017-07-01 DIAGNOSIS — I120 Hypertensive chronic kidney disease with stage 5 chronic kidney disease or end stage renal disease: Secondary | ICD-10-CM | POA: Diagnosis not present

## 2017-07-01 DIAGNOSIS — L89623 Pressure ulcer of left heel, stage 3: Secondary | ICD-10-CM | POA: Diagnosis not present

## 2017-07-01 DIAGNOSIS — D631 Anemia in chronic kidney disease: Secondary | ICD-10-CM | POA: Diagnosis not present

## 2017-07-01 DIAGNOSIS — Z7982 Long term (current) use of aspirin: Secondary | ICD-10-CM | POA: Diagnosis not present

## 2017-07-01 DIAGNOSIS — E1142 Type 2 diabetes mellitus with diabetic polyneuropathy: Secondary | ICD-10-CM | POA: Diagnosis not present

## 2017-07-01 DIAGNOSIS — F339 Major depressive disorder, recurrent, unspecified: Secondary | ICD-10-CM | POA: Diagnosis not present

## 2017-07-01 DIAGNOSIS — F411 Generalized anxiety disorder: Secondary | ICD-10-CM | POA: Diagnosis not present

## 2017-07-01 DIAGNOSIS — E1151 Type 2 diabetes mellitus with diabetic peripheral angiopathy without gangrene: Secondary | ICD-10-CM | POA: Diagnosis not present

## 2017-07-01 DIAGNOSIS — M47816 Spondylosis without myelopathy or radiculopathy, lumbar region: Secondary | ICD-10-CM | POA: Diagnosis not present

## 2017-07-01 DIAGNOSIS — Z992 Dependence on renal dialysis: Secondary | ICD-10-CM | POA: Diagnosis not present

## 2017-07-01 DIAGNOSIS — Z87891 Personal history of nicotine dependence: Secondary | ICD-10-CM | POA: Diagnosis not present

## 2017-07-01 DIAGNOSIS — E1122 Type 2 diabetes mellitus with diabetic chronic kidney disease: Secondary | ICD-10-CM | POA: Diagnosis not present

## 2017-07-02 DIAGNOSIS — D509 Iron deficiency anemia, unspecified: Secondary | ICD-10-CM | POA: Diagnosis not present

## 2017-07-02 DIAGNOSIS — N186 End stage renal disease: Secondary | ICD-10-CM | POA: Diagnosis not present

## 2017-07-02 DIAGNOSIS — D631 Anemia in chronic kidney disease: Secondary | ICD-10-CM | POA: Diagnosis not present

## 2017-07-02 DIAGNOSIS — N2581 Secondary hyperparathyroidism of renal origin: Secondary | ICD-10-CM | POA: Diagnosis not present

## 2017-07-02 DIAGNOSIS — Z992 Dependence on renal dialysis: Secondary | ICD-10-CM | POA: Diagnosis not present

## 2017-07-03 DIAGNOSIS — N185 Chronic kidney disease, stage 5: Secondary | ICD-10-CM | POA: Diagnosis not present

## 2017-07-03 DIAGNOSIS — E1142 Type 2 diabetes mellitus with diabetic polyneuropathy: Secondary | ICD-10-CM | POA: Diagnosis not present

## 2017-07-03 DIAGNOSIS — L89623 Pressure ulcer of left heel, stage 3: Secondary | ICD-10-CM | POA: Diagnosis not present

## 2017-07-03 DIAGNOSIS — I120 Hypertensive chronic kidney disease with stage 5 chronic kidney disease or end stage renal disease: Secondary | ICD-10-CM | POA: Diagnosis not present

## 2017-07-03 DIAGNOSIS — E1122 Type 2 diabetes mellitus with diabetic chronic kidney disease: Secondary | ICD-10-CM | POA: Diagnosis not present

## 2017-07-03 DIAGNOSIS — E1151 Type 2 diabetes mellitus with diabetic peripheral angiopathy without gangrene: Secondary | ICD-10-CM | POA: Diagnosis not present

## 2017-07-04 DIAGNOSIS — D509 Iron deficiency anemia, unspecified: Secondary | ICD-10-CM | POA: Diagnosis not present

## 2017-07-04 DIAGNOSIS — D631 Anemia in chronic kidney disease: Secondary | ICD-10-CM | POA: Diagnosis not present

## 2017-07-04 DIAGNOSIS — N2581 Secondary hyperparathyroidism of renal origin: Secondary | ICD-10-CM | POA: Diagnosis not present

## 2017-07-04 DIAGNOSIS — Z992 Dependence on renal dialysis: Secondary | ICD-10-CM | POA: Diagnosis not present

## 2017-07-04 DIAGNOSIS — N186 End stage renal disease: Secondary | ICD-10-CM | POA: Diagnosis not present

## 2017-07-06 DIAGNOSIS — D631 Anemia in chronic kidney disease: Secondary | ICD-10-CM | POA: Diagnosis not present

## 2017-07-06 DIAGNOSIS — N2581 Secondary hyperparathyroidism of renal origin: Secondary | ICD-10-CM | POA: Diagnosis not present

## 2017-07-06 DIAGNOSIS — N186 End stage renal disease: Secondary | ICD-10-CM | POA: Diagnosis not present

## 2017-07-06 DIAGNOSIS — Z992 Dependence on renal dialysis: Secondary | ICD-10-CM | POA: Diagnosis not present

## 2017-07-06 DIAGNOSIS — D509 Iron deficiency anemia, unspecified: Secondary | ICD-10-CM | POA: Diagnosis not present

## 2017-07-08 DIAGNOSIS — E1122 Type 2 diabetes mellitus with diabetic chronic kidney disease: Secondary | ICD-10-CM | POA: Diagnosis not present

## 2017-07-08 DIAGNOSIS — E1151 Type 2 diabetes mellitus with diabetic peripheral angiopathy without gangrene: Secondary | ICD-10-CM | POA: Diagnosis not present

## 2017-07-08 DIAGNOSIS — I120 Hypertensive chronic kidney disease with stage 5 chronic kidney disease or end stage renal disease: Secondary | ICD-10-CM | POA: Diagnosis not present

## 2017-07-08 DIAGNOSIS — L89623 Pressure ulcer of left heel, stage 3: Secondary | ICD-10-CM | POA: Diagnosis not present

## 2017-07-08 DIAGNOSIS — E1142 Type 2 diabetes mellitus with diabetic polyneuropathy: Secondary | ICD-10-CM | POA: Diagnosis not present

## 2017-07-08 DIAGNOSIS — N185 Chronic kidney disease, stage 5: Secondary | ICD-10-CM | POA: Diagnosis not present

## 2017-07-09 DIAGNOSIS — N186 End stage renal disease: Secondary | ICD-10-CM | POA: Diagnosis not present

## 2017-07-09 DIAGNOSIS — Z992 Dependence on renal dialysis: Secondary | ICD-10-CM | POA: Diagnosis not present

## 2017-07-09 DIAGNOSIS — D509 Iron deficiency anemia, unspecified: Secondary | ICD-10-CM | POA: Diagnosis not present

## 2017-07-09 DIAGNOSIS — D631 Anemia in chronic kidney disease: Secondary | ICD-10-CM | POA: Diagnosis not present

## 2017-07-09 DIAGNOSIS — N2581 Secondary hyperparathyroidism of renal origin: Secondary | ICD-10-CM | POA: Diagnosis not present

## 2017-07-11 DIAGNOSIS — D631 Anemia in chronic kidney disease: Secondary | ICD-10-CM | POA: Diagnosis not present

## 2017-07-11 DIAGNOSIS — N2581 Secondary hyperparathyroidism of renal origin: Secondary | ICD-10-CM | POA: Diagnosis not present

## 2017-07-11 DIAGNOSIS — Z992 Dependence on renal dialysis: Secondary | ICD-10-CM | POA: Diagnosis not present

## 2017-07-11 DIAGNOSIS — N186 End stage renal disease: Secondary | ICD-10-CM | POA: Diagnosis not present

## 2017-07-11 DIAGNOSIS — D509 Iron deficiency anemia, unspecified: Secondary | ICD-10-CM | POA: Diagnosis not present

## 2017-07-12 DIAGNOSIS — Z992 Dependence on renal dialysis: Secondary | ICD-10-CM | POA: Diagnosis not present

## 2017-07-12 DIAGNOSIS — N186 End stage renal disease: Secondary | ICD-10-CM | POA: Diagnosis not present

## 2017-07-13 DIAGNOSIS — D631 Anemia in chronic kidney disease: Secondary | ICD-10-CM | POA: Diagnosis not present

## 2017-07-13 DIAGNOSIS — Z992 Dependence on renal dialysis: Secondary | ICD-10-CM | POA: Diagnosis not present

## 2017-07-13 DIAGNOSIS — D509 Iron deficiency anemia, unspecified: Secondary | ICD-10-CM | POA: Diagnosis not present

## 2017-07-13 DIAGNOSIS — N186 End stage renal disease: Secondary | ICD-10-CM | POA: Diagnosis not present

## 2017-07-13 DIAGNOSIS — N2581 Secondary hyperparathyroidism of renal origin: Secondary | ICD-10-CM | POA: Diagnosis not present

## 2017-07-15 DIAGNOSIS — D509 Iron deficiency anemia, unspecified: Secondary | ICD-10-CM | POA: Diagnosis not present

## 2017-07-15 DIAGNOSIS — L89623 Pressure ulcer of left heel, stage 3: Secondary | ICD-10-CM | POA: Diagnosis not present

## 2017-07-15 DIAGNOSIS — D631 Anemia in chronic kidney disease: Secondary | ICD-10-CM | POA: Diagnosis not present

## 2017-07-15 DIAGNOSIS — I120 Hypertensive chronic kidney disease with stage 5 chronic kidney disease or end stage renal disease: Secondary | ICD-10-CM | POA: Diagnosis not present

## 2017-07-15 DIAGNOSIS — Z992 Dependence on renal dialysis: Secondary | ICD-10-CM | POA: Diagnosis not present

## 2017-07-15 DIAGNOSIS — N2581 Secondary hyperparathyroidism of renal origin: Secondary | ICD-10-CM | POA: Diagnosis not present

## 2017-07-15 DIAGNOSIS — E1151 Type 2 diabetes mellitus with diabetic peripheral angiopathy without gangrene: Secondary | ICD-10-CM | POA: Diagnosis not present

## 2017-07-15 DIAGNOSIS — E1122 Type 2 diabetes mellitus with diabetic chronic kidney disease: Secondary | ICD-10-CM | POA: Diagnosis not present

## 2017-07-15 DIAGNOSIS — N186 End stage renal disease: Secondary | ICD-10-CM | POA: Diagnosis not present

## 2017-07-15 DIAGNOSIS — N185 Chronic kidney disease, stage 5: Secondary | ICD-10-CM | POA: Diagnosis not present

## 2017-07-15 DIAGNOSIS — E1142 Type 2 diabetes mellitus with diabetic polyneuropathy: Secondary | ICD-10-CM | POA: Diagnosis not present

## 2017-07-16 DIAGNOSIS — N2581 Secondary hyperparathyroidism of renal origin: Secondary | ICD-10-CM | POA: Diagnosis not present

## 2017-07-16 DIAGNOSIS — N186 End stage renal disease: Secondary | ICD-10-CM | POA: Diagnosis not present

## 2017-07-16 DIAGNOSIS — Z992 Dependence on renal dialysis: Secondary | ICD-10-CM | POA: Diagnosis not present

## 2017-07-16 DIAGNOSIS — D631 Anemia in chronic kidney disease: Secondary | ICD-10-CM | POA: Diagnosis not present

## 2017-07-16 DIAGNOSIS — D509 Iron deficiency anemia, unspecified: Secondary | ICD-10-CM | POA: Diagnosis not present

## 2017-07-18 DIAGNOSIS — D509 Iron deficiency anemia, unspecified: Secondary | ICD-10-CM | POA: Diagnosis not present

## 2017-07-18 DIAGNOSIS — Z992 Dependence on renal dialysis: Secondary | ICD-10-CM | POA: Diagnosis not present

## 2017-07-18 DIAGNOSIS — N186 End stage renal disease: Secondary | ICD-10-CM | POA: Diagnosis not present

## 2017-07-18 DIAGNOSIS — D631 Anemia in chronic kidney disease: Secondary | ICD-10-CM | POA: Diagnosis not present

## 2017-07-18 DIAGNOSIS — N2581 Secondary hyperparathyroidism of renal origin: Secondary | ICD-10-CM | POA: Diagnosis not present

## 2017-07-20 DIAGNOSIS — D509 Iron deficiency anemia, unspecified: Secondary | ICD-10-CM | POA: Diagnosis not present

## 2017-07-20 DIAGNOSIS — N186 End stage renal disease: Secondary | ICD-10-CM | POA: Diagnosis not present

## 2017-07-20 DIAGNOSIS — D631 Anemia in chronic kidney disease: Secondary | ICD-10-CM | POA: Diagnosis not present

## 2017-07-20 DIAGNOSIS — N2581 Secondary hyperparathyroidism of renal origin: Secondary | ICD-10-CM | POA: Diagnosis not present

## 2017-07-20 DIAGNOSIS — Z992 Dependence on renal dialysis: Secondary | ICD-10-CM | POA: Diagnosis not present

## 2017-07-24 DIAGNOSIS — N2581 Secondary hyperparathyroidism of renal origin: Secondary | ICD-10-CM | POA: Diagnosis not present

## 2017-07-24 DIAGNOSIS — D631 Anemia in chronic kidney disease: Secondary | ICD-10-CM | POA: Diagnosis not present

## 2017-07-24 DIAGNOSIS — N186 End stage renal disease: Secondary | ICD-10-CM | POA: Diagnosis not present

## 2017-07-24 DIAGNOSIS — D509 Iron deficiency anemia, unspecified: Secondary | ICD-10-CM | POA: Diagnosis not present

## 2017-07-24 DIAGNOSIS — Z992 Dependence on renal dialysis: Secondary | ICD-10-CM | POA: Diagnosis not present

## 2017-07-25 DIAGNOSIS — D509 Iron deficiency anemia, unspecified: Secondary | ICD-10-CM | POA: Diagnosis not present

## 2017-07-25 DIAGNOSIS — N185 Chronic kidney disease, stage 5: Secondary | ICD-10-CM | POA: Diagnosis not present

## 2017-07-25 DIAGNOSIS — L89623 Pressure ulcer of left heel, stage 3: Secondary | ICD-10-CM | POA: Diagnosis not present

## 2017-07-25 DIAGNOSIS — N186 End stage renal disease: Secondary | ICD-10-CM | POA: Diagnosis not present

## 2017-07-25 DIAGNOSIS — E1142 Type 2 diabetes mellitus with diabetic polyneuropathy: Secondary | ICD-10-CM | POA: Diagnosis not present

## 2017-07-25 DIAGNOSIS — E1122 Type 2 diabetes mellitus with diabetic chronic kidney disease: Secondary | ICD-10-CM | POA: Diagnosis not present

## 2017-07-25 DIAGNOSIS — Z992 Dependence on renal dialysis: Secondary | ICD-10-CM | POA: Diagnosis not present

## 2017-07-25 DIAGNOSIS — I120 Hypertensive chronic kidney disease with stage 5 chronic kidney disease or end stage renal disease: Secondary | ICD-10-CM | POA: Diagnosis not present

## 2017-07-25 DIAGNOSIS — D631 Anemia in chronic kidney disease: Secondary | ICD-10-CM | POA: Diagnosis not present

## 2017-07-25 DIAGNOSIS — E1151 Type 2 diabetes mellitus with diabetic peripheral angiopathy without gangrene: Secondary | ICD-10-CM | POA: Diagnosis not present

## 2017-07-25 DIAGNOSIS — N2581 Secondary hyperparathyroidism of renal origin: Secondary | ICD-10-CM | POA: Diagnosis not present

## 2017-07-27 DIAGNOSIS — Z992 Dependence on renal dialysis: Secondary | ICD-10-CM | POA: Diagnosis not present

## 2017-07-27 DIAGNOSIS — N186 End stage renal disease: Secondary | ICD-10-CM | POA: Diagnosis not present

## 2017-07-27 DIAGNOSIS — D631 Anemia in chronic kidney disease: Secondary | ICD-10-CM | POA: Diagnosis not present

## 2017-07-27 DIAGNOSIS — D509 Iron deficiency anemia, unspecified: Secondary | ICD-10-CM | POA: Diagnosis not present

## 2017-07-27 DIAGNOSIS — N2581 Secondary hyperparathyroidism of renal origin: Secondary | ICD-10-CM | POA: Diagnosis not present

## 2017-07-30 DIAGNOSIS — E119 Type 2 diabetes mellitus without complications: Secondary | ICD-10-CM | POA: Diagnosis not present

## 2017-07-30 DIAGNOSIS — D509 Iron deficiency anemia, unspecified: Secondary | ICD-10-CM | POA: Diagnosis not present

## 2017-07-30 DIAGNOSIS — Z794 Long term (current) use of insulin: Secondary | ICD-10-CM | POA: Diagnosis not present

## 2017-07-30 DIAGNOSIS — Z992 Dependence on renal dialysis: Secondary | ICD-10-CM | POA: Diagnosis not present

## 2017-07-30 DIAGNOSIS — N2581 Secondary hyperparathyroidism of renal origin: Secondary | ICD-10-CM | POA: Diagnosis not present

## 2017-07-30 DIAGNOSIS — D631 Anemia in chronic kidney disease: Secondary | ICD-10-CM | POA: Diagnosis not present

## 2017-07-30 DIAGNOSIS — N186 End stage renal disease: Secondary | ICD-10-CM | POA: Diagnosis not present

## 2017-07-31 DIAGNOSIS — E1142 Type 2 diabetes mellitus with diabetic polyneuropathy: Secondary | ICD-10-CM | POA: Diagnosis not present

## 2017-07-31 DIAGNOSIS — I120 Hypertensive chronic kidney disease with stage 5 chronic kidney disease or end stage renal disease: Secondary | ICD-10-CM | POA: Diagnosis not present

## 2017-07-31 DIAGNOSIS — E1122 Type 2 diabetes mellitus with diabetic chronic kidney disease: Secondary | ICD-10-CM | POA: Diagnosis not present

## 2017-07-31 DIAGNOSIS — L89623 Pressure ulcer of left heel, stage 3: Secondary | ICD-10-CM | POA: Diagnosis not present

## 2017-07-31 DIAGNOSIS — E1151 Type 2 diabetes mellitus with diabetic peripheral angiopathy without gangrene: Secondary | ICD-10-CM | POA: Diagnosis not present

## 2017-07-31 DIAGNOSIS — N185 Chronic kidney disease, stage 5: Secondary | ICD-10-CM | POA: Diagnosis not present

## 2017-08-01 DIAGNOSIS — N186 End stage renal disease: Secondary | ICD-10-CM | POA: Diagnosis not present

## 2017-08-01 DIAGNOSIS — Z992 Dependence on renal dialysis: Secondary | ICD-10-CM | POA: Diagnosis not present

## 2017-08-01 DIAGNOSIS — D631 Anemia in chronic kidney disease: Secondary | ICD-10-CM | POA: Diagnosis not present

## 2017-08-01 DIAGNOSIS — N2581 Secondary hyperparathyroidism of renal origin: Secondary | ICD-10-CM | POA: Diagnosis not present

## 2017-08-01 DIAGNOSIS — D509 Iron deficiency anemia, unspecified: Secondary | ICD-10-CM | POA: Diagnosis not present

## 2017-08-02 DIAGNOSIS — E1143 Type 2 diabetes mellitus with diabetic autonomic (poly)neuropathy: Secondary | ICD-10-CM | POA: Diagnosis not present

## 2017-08-02 DIAGNOSIS — E039 Hypothyroidism, unspecified: Secondary | ICD-10-CM | POA: Diagnosis not present

## 2017-08-02 DIAGNOSIS — E1122 Type 2 diabetes mellitus with diabetic chronic kidney disease: Secondary | ICD-10-CM | POA: Diagnosis not present

## 2017-08-02 DIAGNOSIS — K210 Gastro-esophageal reflux disease with esophagitis: Secondary | ICD-10-CM | POA: Diagnosis not present

## 2017-08-02 DIAGNOSIS — I10 Essential (primary) hypertension: Secondary | ICD-10-CM | POA: Diagnosis not present

## 2017-08-02 DIAGNOSIS — Z9189 Other specified personal risk factors, not elsewhere classified: Secondary | ICD-10-CM | POA: Diagnosis not present

## 2017-08-02 DIAGNOSIS — E1142 Type 2 diabetes mellitus with diabetic polyneuropathy: Secondary | ICD-10-CM | POA: Diagnosis not present

## 2017-08-02 DIAGNOSIS — E782 Mixed hyperlipidemia: Secondary | ICD-10-CM | POA: Diagnosis not present

## 2017-08-02 DIAGNOSIS — J440 Chronic obstructive pulmonary disease with acute lower respiratory infection: Secondary | ICD-10-CM | POA: Diagnosis not present

## 2017-08-02 DIAGNOSIS — J449 Chronic obstructive pulmonary disease, unspecified: Secondary | ICD-10-CM | POA: Diagnosis not present

## 2017-08-03 DIAGNOSIS — Z992 Dependence on renal dialysis: Secondary | ICD-10-CM | POA: Diagnosis not present

## 2017-08-03 DIAGNOSIS — D509 Iron deficiency anemia, unspecified: Secondary | ICD-10-CM | POA: Diagnosis not present

## 2017-08-03 DIAGNOSIS — N2581 Secondary hyperparathyroidism of renal origin: Secondary | ICD-10-CM | POA: Diagnosis not present

## 2017-08-03 DIAGNOSIS — N186 End stage renal disease: Secondary | ICD-10-CM | POA: Diagnosis not present

## 2017-08-03 DIAGNOSIS — D631 Anemia in chronic kidney disease: Secondary | ICD-10-CM | POA: Diagnosis not present

## 2017-08-05 DIAGNOSIS — D509 Iron deficiency anemia, unspecified: Secondary | ICD-10-CM | POA: Diagnosis not present

## 2017-08-05 DIAGNOSIS — Z992 Dependence on renal dialysis: Secondary | ICD-10-CM | POA: Diagnosis not present

## 2017-08-05 DIAGNOSIS — N2581 Secondary hyperparathyroidism of renal origin: Secondary | ICD-10-CM | POA: Diagnosis not present

## 2017-08-05 DIAGNOSIS — N186 End stage renal disease: Secondary | ICD-10-CM | POA: Diagnosis not present

## 2017-08-05 DIAGNOSIS — D631 Anemia in chronic kidney disease: Secondary | ICD-10-CM | POA: Diagnosis not present

## 2017-08-08 DIAGNOSIS — N186 End stage renal disease: Secondary | ICD-10-CM | POA: Diagnosis not present

## 2017-08-08 DIAGNOSIS — D509 Iron deficiency anemia, unspecified: Secondary | ICD-10-CM | POA: Diagnosis not present

## 2017-08-08 DIAGNOSIS — Z992 Dependence on renal dialysis: Secondary | ICD-10-CM | POA: Diagnosis not present

## 2017-08-08 DIAGNOSIS — D631 Anemia in chronic kidney disease: Secondary | ICD-10-CM | POA: Diagnosis not present

## 2017-08-08 DIAGNOSIS — N2581 Secondary hyperparathyroidism of renal origin: Secondary | ICD-10-CM | POA: Diagnosis not present

## 2017-08-09 DIAGNOSIS — E782 Mixed hyperlipidemia: Secondary | ICD-10-CM | POA: Diagnosis not present

## 2017-08-09 DIAGNOSIS — E1151 Type 2 diabetes mellitus with diabetic peripheral angiopathy without gangrene: Secondary | ICD-10-CM | POA: Diagnosis not present

## 2017-08-09 DIAGNOSIS — E1122 Type 2 diabetes mellitus with diabetic chronic kidney disease: Secondary | ICD-10-CM | POA: Diagnosis not present

## 2017-08-09 DIAGNOSIS — N185 Chronic kidney disease, stage 5: Secondary | ICD-10-CM | POA: Diagnosis not present

## 2017-08-09 DIAGNOSIS — M1711 Unilateral primary osteoarthritis, right knee: Secondary | ICD-10-CM | POA: Diagnosis not present

## 2017-08-09 DIAGNOSIS — I10 Essential (primary) hypertension: Secondary | ICD-10-CM | POA: Diagnosis not present

## 2017-08-09 DIAGNOSIS — Z6831 Body mass index (BMI) 31.0-31.9, adult: Secondary | ICD-10-CM | POA: Diagnosis not present

## 2017-08-09 DIAGNOSIS — I739 Peripheral vascular disease, unspecified: Secondary | ICD-10-CM | POA: Diagnosis not present

## 2017-08-09 DIAGNOSIS — L89623 Pressure ulcer of left heel, stage 3: Secondary | ICD-10-CM | POA: Diagnosis not present

## 2017-08-09 DIAGNOSIS — I120 Hypertensive chronic kidney disease with stage 5 chronic kidney disease or end stage renal disease: Secondary | ICD-10-CM | POA: Diagnosis not present

## 2017-08-09 DIAGNOSIS — G6289 Other specified polyneuropathies: Secondary | ICD-10-CM | POA: Diagnosis not present

## 2017-08-09 DIAGNOSIS — E1142 Type 2 diabetes mellitus with diabetic polyneuropathy: Secondary | ICD-10-CM | POA: Diagnosis not present

## 2017-08-10 DIAGNOSIS — N186 End stage renal disease: Secondary | ICD-10-CM | POA: Diagnosis not present

## 2017-08-10 DIAGNOSIS — Z992 Dependence on renal dialysis: Secondary | ICD-10-CM | POA: Diagnosis not present

## 2017-08-10 DIAGNOSIS — D631 Anemia in chronic kidney disease: Secondary | ICD-10-CM | POA: Diagnosis not present

## 2017-08-10 DIAGNOSIS — N2581 Secondary hyperparathyroidism of renal origin: Secondary | ICD-10-CM | POA: Diagnosis not present

## 2017-08-10 DIAGNOSIS — D509 Iron deficiency anemia, unspecified: Secondary | ICD-10-CM | POA: Diagnosis not present

## 2017-08-12 DIAGNOSIS — N186 End stage renal disease: Secondary | ICD-10-CM | POA: Diagnosis not present

## 2017-08-12 DIAGNOSIS — D631 Anemia in chronic kidney disease: Secondary | ICD-10-CM | POA: Diagnosis not present

## 2017-08-12 DIAGNOSIS — D509 Iron deficiency anemia, unspecified: Secondary | ICD-10-CM | POA: Diagnosis not present

## 2017-08-12 DIAGNOSIS — N2581 Secondary hyperparathyroidism of renal origin: Secondary | ICD-10-CM | POA: Diagnosis not present

## 2017-08-12 DIAGNOSIS — Z992 Dependence on renal dialysis: Secondary | ICD-10-CM | POA: Diagnosis not present

## 2017-08-13 DIAGNOSIS — I120 Hypertensive chronic kidney disease with stage 5 chronic kidney disease or end stage renal disease: Secondary | ICD-10-CM | POA: Diagnosis not present

## 2017-08-13 DIAGNOSIS — Z992 Dependence on renal dialysis: Secondary | ICD-10-CM | POA: Diagnosis not present

## 2017-08-13 DIAGNOSIS — E1151 Type 2 diabetes mellitus with diabetic peripheral angiopathy without gangrene: Secondary | ICD-10-CM | POA: Diagnosis not present

## 2017-08-13 DIAGNOSIS — N186 End stage renal disease: Secondary | ICD-10-CM | POA: Diagnosis not present

## 2017-08-13 DIAGNOSIS — E1122 Type 2 diabetes mellitus with diabetic chronic kidney disease: Secondary | ICD-10-CM | POA: Diagnosis not present

## 2017-08-13 DIAGNOSIS — N185 Chronic kidney disease, stage 5: Secondary | ICD-10-CM | POA: Diagnosis not present

## 2017-08-13 DIAGNOSIS — D631 Anemia in chronic kidney disease: Secondary | ICD-10-CM | POA: Diagnosis not present

## 2017-08-13 DIAGNOSIS — E1142 Type 2 diabetes mellitus with diabetic polyneuropathy: Secondary | ICD-10-CM | POA: Diagnosis not present

## 2017-08-13 DIAGNOSIS — N2581 Secondary hyperparathyroidism of renal origin: Secondary | ICD-10-CM | POA: Diagnosis not present

## 2017-08-13 DIAGNOSIS — L89623 Pressure ulcer of left heel, stage 3: Secondary | ICD-10-CM | POA: Diagnosis not present

## 2017-08-13 DIAGNOSIS — D509 Iron deficiency anemia, unspecified: Secondary | ICD-10-CM | POA: Diagnosis not present

## 2017-08-14 DIAGNOSIS — N186 End stage renal disease: Secondary | ICD-10-CM | POA: Diagnosis not present

## 2017-08-14 DIAGNOSIS — Z992 Dependence on renal dialysis: Secondary | ICD-10-CM | POA: Diagnosis not present

## 2017-08-14 DIAGNOSIS — N2581 Secondary hyperparathyroidism of renal origin: Secondary | ICD-10-CM | POA: Diagnosis not present

## 2017-08-14 DIAGNOSIS — D509 Iron deficiency anemia, unspecified: Secondary | ICD-10-CM | POA: Diagnosis not present

## 2017-08-14 DIAGNOSIS — D631 Anemia in chronic kidney disease: Secondary | ICD-10-CM | POA: Diagnosis not present

## 2017-08-15 DIAGNOSIS — Z992 Dependence on renal dialysis: Secondary | ICD-10-CM | POA: Diagnosis not present

## 2017-08-15 DIAGNOSIS — D631 Anemia in chronic kidney disease: Secondary | ICD-10-CM | POA: Diagnosis not present

## 2017-08-15 DIAGNOSIS — N2581 Secondary hyperparathyroidism of renal origin: Secondary | ICD-10-CM | POA: Diagnosis not present

## 2017-08-15 DIAGNOSIS — N186 End stage renal disease: Secondary | ICD-10-CM | POA: Diagnosis not present

## 2017-08-15 DIAGNOSIS — D509 Iron deficiency anemia, unspecified: Secondary | ICD-10-CM | POA: Diagnosis not present

## 2017-08-17 DIAGNOSIS — N186 End stage renal disease: Secondary | ICD-10-CM | POA: Diagnosis not present

## 2017-08-17 DIAGNOSIS — D631 Anemia in chronic kidney disease: Secondary | ICD-10-CM | POA: Diagnosis not present

## 2017-08-17 DIAGNOSIS — D509 Iron deficiency anemia, unspecified: Secondary | ICD-10-CM | POA: Diagnosis not present

## 2017-08-17 DIAGNOSIS — N2581 Secondary hyperparathyroidism of renal origin: Secondary | ICD-10-CM | POA: Diagnosis not present

## 2017-08-17 DIAGNOSIS — Z992 Dependence on renal dialysis: Secondary | ICD-10-CM | POA: Diagnosis not present

## 2017-08-20 DIAGNOSIS — D631 Anemia in chronic kidney disease: Secondary | ICD-10-CM | POA: Diagnosis not present

## 2017-08-20 DIAGNOSIS — N2581 Secondary hyperparathyroidism of renal origin: Secondary | ICD-10-CM | POA: Diagnosis not present

## 2017-08-20 DIAGNOSIS — N186 End stage renal disease: Secondary | ICD-10-CM | POA: Diagnosis not present

## 2017-08-20 DIAGNOSIS — Z992 Dependence on renal dialysis: Secondary | ICD-10-CM | POA: Diagnosis not present

## 2017-08-20 DIAGNOSIS — D509 Iron deficiency anemia, unspecified: Secondary | ICD-10-CM | POA: Diagnosis not present

## 2017-08-21 DIAGNOSIS — I120 Hypertensive chronic kidney disease with stage 5 chronic kidney disease or end stage renal disease: Secondary | ICD-10-CM | POA: Diagnosis not present

## 2017-08-21 DIAGNOSIS — N185 Chronic kidney disease, stage 5: Secondary | ICD-10-CM | POA: Diagnosis not present

## 2017-08-21 DIAGNOSIS — E1122 Type 2 diabetes mellitus with diabetic chronic kidney disease: Secondary | ICD-10-CM | POA: Diagnosis not present

## 2017-08-21 DIAGNOSIS — E1142 Type 2 diabetes mellitus with diabetic polyneuropathy: Secondary | ICD-10-CM | POA: Diagnosis not present

## 2017-08-21 DIAGNOSIS — E1151 Type 2 diabetes mellitus with diabetic peripheral angiopathy without gangrene: Secondary | ICD-10-CM | POA: Diagnosis not present

## 2017-08-21 DIAGNOSIS — L89623 Pressure ulcer of left heel, stage 3: Secondary | ICD-10-CM | POA: Diagnosis not present

## 2017-08-22 DIAGNOSIS — N186 End stage renal disease: Secondary | ICD-10-CM | POA: Diagnosis not present

## 2017-08-22 DIAGNOSIS — D509 Iron deficiency anemia, unspecified: Secondary | ICD-10-CM | POA: Diagnosis not present

## 2017-08-22 DIAGNOSIS — N2581 Secondary hyperparathyroidism of renal origin: Secondary | ICD-10-CM | POA: Diagnosis not present

## 2017-08-22 DIAGNOSIS — D631 Anemia in chronic kidney disease: Secondary | ICD-10-CM | POA: Diagnosis not present

## 2017-08-22 DIAGNOSIS — Z992 Dependence on renal dialysis: Secondary | ICD-10-CM | POA: Diagnosis not present

## 2017-08-23 DIAGNOSIS — L89623 Pressure ulcer of left heel, stage 3: Secondary | ICD-10-CM | POA: Diagnosis not present

## 2017-08-23 DIAGNOSIS — E1142 Type 2 diabetes mellitus with diabetic polyneuropathy: Secondary | ICD-10-CM | POA: Diagnosis not present

## 2017-08-23 DIAGNOSIS — E1122 Type 2 diabetes mellitus with diabetic chronic kidney disease: Secondary | ICD-10-CM | POA: Diagnosis not present

## 2017-08-23 DIAGNOSIS — E1151 Type 2 diabetes mellitus with diabetic peripheral angiopathy without gangrene: Secondary | ICD-10-CM | POA: Diagnosis not present

## 2017-08-24 DIAGNOSIS — Z992 Dependence on renal dialysis: Secondary | ICD-10-CM | POA: Diagnosis not present

## 2017-08-24 DIAGNOSIS — D509 Iron deficiency anemia, unspecified: Secondary | ICD-10-CM | POA: Diagnosis not present

## 2017-08-24 DIAGNOSIS — N186 End stage renal disease: Secondary | ICD-10-CM | POA: Diagnosis not present

## 2017-08-24 DIAGNOSIS — D631 Anemia in chronic kidney disease: Secondary | ICD-10-CM | POA: Diagnosis not present

## 2017-08-24 DIAGNOSIS — N2581 Secondary hyperparathyroidism of renal origin: Secondary | ICD-10-CM | POA: Diagnosis not present

## 2017-08-27 DIAGNOSIS — D509 Iron deficiency anemia, unspecified: Secondary | ICD-10-CM | POA: Diagnosis not present

## 2017-08-27 DIAGNOSIS — Z992 Dependence on renal dialysis: Secondary | ICD-10-CM | POA: Diagnosis not present

## 2017-08-27 DIAGNOSIS — D631 Anemia in chronic kidney disease: Secondary | ICD-10-CM | POA: Diagnosis not present

## 2017-08-27 DIAGNOSIS — N2581 Secondary hyperparathyroidism of renal origin: Secondary | ICD-10-CM | POA: Diagnosis not present

## 2017-08-27 DIAGNOSIS — N186 End stage renal disease: Secondary | ICD-10-CM | POA: Diagnosis not present

## 2017-08-28 DIAGNOSIS — E1122 Type 2 diabetes mellitus with diabetic chronic kidney disease: Secondary | ICD-10-CM | POA: Diagnosis not present

## 2017-08-28 DIAGNOSIS — E1142 Type 2 diabetes mellitus with diabetic polyneuropathy: Secondary | ICD-10-CM | POA: Diagnosis not present

## 2017-08-28 DIAGNOSIS — L89623 Pressure ulcer of left heel, stage 3: Secondary | ICD-10-CM | POA: Diagnosis not present

## 2017-08-28 DIAGNOSIS — E1151 Type 2 diabetes mellitus with diabetic peripheral angiopathy without gangrene: Secondary | ICD-10-CM | POA: Diagnosis not present

## 2017-08-28 DIAGNOSIS — I120 Hypertensive chronic kidney disease with stage 5 chronic kidney disease or end stage renal disease: Secondary | ICD-10-CM | POA: Diagnosis not present

## 2017-08-28 DIAGNOSIS — N185 Chronic kidney disease, stage 5: Secondary | ICD-10-CM | POA: Diagnosis not present

## 2017-08-29 DIAGNOSIS — D509 Iron deficiency anemia, unspecified: Secondary | ICD-10-CM | POA: Diagnosis not present

## 2017-08-29 DIAGNOSIS — Z992 Dependence on renal dialysis: Secondary | ICD-10-CM | POA: Diagnosis not present

## 2017-08-29 DIAGNOSIS — N186 End stage renal disease: Secondary | ICD-10-CM | POA: Diagnosis not present

## 2017-08-29 DIAGNOSIS — N2581 Secondary hyperparathyroidism of renal origin: Secondary | ICD-10-CM | POA: Diagnosis not present

## 2017-08-29 DIAGNOSIS — D631 Anemia in chronic kidney disease: Secondary | ICD-10-CM | POA: Diagnosis not present

## 2017-08-30 DIAGNOSIS — L89623 Pressure ulcer of left heel, stage 3: Secondary | ICD-10-CM | POA: Diagnosis not present

## 2017-08-30 DIAGNOSIS — F411 Generalized anxiety disorder: Secondary | ICD-10-CM | POA: Diagnosis not present

## 2017-08-30 DIAGNOSIS — Z4800 Encounter for change or removal of nonsurgical wound dressing: Secondary | ICD-10-CM | POA: Diagnosis not present

## 2017-08-30 DIAGNOSIS — Z87891 Personal history of nicotine dependence: Secondary | ICD-10-CM | POA: Diagnosis not present

## 2017-08-30 DIAGNOSIS — F339 Major depressive disorder, recurrent, unspecified: Secondary | ICD-10-CM | POA: Diagnosis not present

## 2017-08-30 DIAGNOSIS — Z8673 Personal history of transient ischemic attack (TIA), and cerebral infarction without residual deficits: Secondary | ICD-10-CM | POA: Diagnosis not present

## 2017-08-30 DIAGNOSIS — E1142 Type 2 diabetes mellitus with diabetic polyneuropathy: Secondary | ICD-10-CM | POA: Diagnosis not present

## 2017-08-30 DIAGNOSIS — Z992 Dependence on renal dialysis: Secondary | ICD-10-CM | POA: Diagnosis not present

## 2017-08-30 DIAGNOSIS — D631 Anemia in chronic kidney disease: Secondary | ICD-10-CM | POA: Diagnosis not present

## 2017-08-30 DIAGNOSIS — M47816 Spondylosis without myelopathy or radiculopathy, lumbar region: Secondary | ICD-10-CM | POA: Diagnosis not present

## 2017-08-30 DIAGNOSIS — E1151 Type 2 diabetes mellitus with diabetic peripheral angiopathy without gangrene: Secondary | ICD-10-CM | POA: Diagnosis not present

## 2017-08-30 DIAGNOSIS — N185 Chronic kidney disease, stage 5: Secondary | ICD-10-CM | POA: Diagnosis not present

## 2017-08-30 DIAGNOSIS — Z7902 Long term (current) use of antithrombotics/antiplatelets: Secondary | ICD-10-CM | POA: Diagnosis not present

## 2017-08-30 DIAGNOSIS — E1122 Type 2 diabetes mellitus with diabetic chronic kidney disease: Secondary | ICD-10-CM | POA: Diagnosis not present

## 2017-08-30 DIAGNOSIS — Z794 Long term (current) use of insulin: Secondary | ICD-10-CM | POA: Diagnosis not present

## 2017-08-30 DIAGNOSIS — G8929 Other chronic pain: Secondary | ICD-10-CM | POA: Diagnosis not present

## 2017-08-30 DIAGNOSIS — I120 Hypertensive chronic kidney disease with stage 5 chronic kidney disease or end stage renal disease: Secondary | ICD-10-CM | POA: Diagnosis not present

## 2017-08-30 DIAGNOSIS — Z7951 Long term (current) use of inhaled steroids: Secondary | ICD-10-CM | POA: Diagnosis not present

## 2017-08-30 DIAGNOSIS — Z7982 Long term (current) use of aspirin: Secondary | ICD-10-CM | POA: Diagnosis not present

## 2017-08-31 DIAGNOSIS — N2581 Secondary hyperparathyroidism of renal origin: Secondary | ICD-10-CM | POA: Diagnosis not present

## 2017-08-31 DIAGNOSIS — D631 Anemia in chronic kidney disease: Secondary | ICD-10-CM | POA: Diagnosis not present

## 2017-08-31 DIAGNOSIS — N186 End stage renal disease: Secondary | ICD-10-CM | POA: Diagnosis not present

## 2017-08-31 DIAGNOSIS — Z992 Dependence on renal dialysis: Secondary | ICD-10-CM | POA: Diagnosis not present

## 2017-08-31 DIAGNOSIS — D509 Iron deficiency anemia, unspecified: Secondary | ICD-10-CM | POA: Diagnosis not present

## 2017-09-03 DIAGNOSIS — Z992 Dependence on renal dialysis: Secondary | ICD-10-CM | POA: Diagnosis not present

## 2017-09-03 DIAGNOSIS — D509 Iron deficiency anemia, unspecified: Secondary | ICD-10-CM | POA: Diagnosis not present

## 2017-09-03 DIAGNOSIS — N186 End stage renal disease: Secondary | ICD-10-CM | POA: Diagnosis not present

## 2017-09-03 DIAGNOSIS — N2581 Secondary hyperparathyroidism of renal origin: Secondary | ICD-10-CM | POA: Diagnosis not present

## 2017-09-03 DIAGNOSIS — D631 Anemia in chronic kidney disease: Secondary | ICD-10-CM | POA: Diagnosis not present

## 2017-09-04 DIAGNOSIS — L89623 Pressure ulcer of left heel, stage 3: Secondary | ICD-10-CM | POA: Diagnosis not present

## 2017-09-04 DIAGNOSIS — E1151 Type 2 diabetes mellitus with diabetic peripheral angiopathy without gangrene: Secondary | ICD-10-CM | POA: Diagnosis not present

## 2017-09-04 DIAGNOSIS — E1142 Type 2 diabetes mellitus with diabetic polyneuropathy: Secondary | ICD-10-CM | POA: Diagnosis not present

## 2017-09-04 DIAGNOSIS — I120 Hypertensive chronic kidney disease with stage 5 chronic kidney disease or end stage renal disease: Secondary | ICD-10-CM | POA: Diagnosis not present

## 2017-09-04 DIAGNOSIS — E1122 Type 2 diabetes mellitus with diabetic chronic kidney disease: Secondary | ICD-10-CM | POA: Diagnosis not present

## 2017-09-04 DIAGNOSIS — N185 Chronic kidney disease, stage 5: Secondary | ICD-10-CM | POA: Diagnosis not present

## 2017-09-05 DIAGNOSIS — I120 Hypertensive chronic kidney disease with stage 5 chronic kidney disease or end stage renal disease: Secondary | ICD-10-CM | POA: Diagnosis not present

## 2017-09-05 DIAGNOSIS — L89623 Pressure ulcer of left heel, stage 3: Secondary | ICD-10-CM | POA: Diagnosis not present

## 2017-09-05 DIAGNOSIS — N186 End stage renal disease: Secondary | ICD-10-CM | POA: Diagnosis not present

## 2017-09-05 DIAGNOSIS — D631 Anemia in chronic kidney disease: Secondary | ICD-10-CM | POA: Diagnosis not present

## 2017-09-05 DIAGNOSIS — D509 Iron deficiency anemia, unspecified: Secondary | ICD-10-CM | POA: Diagnosis not present

## 2017-09-05 DIAGNOSIS — Z992 Dependence on renal dialysis: Secondary | ICD-10-CM | POA: Diagnosis not present

## 2017-09-05 DIAGNOSIS — N185 Chronic kidney disease, stage 5: Secondary | ICD-10-CM | POA: Diagnosis not present

## 2017-09-05 DIAGNOSIS — N2581 Secondary hyperparathyroidism of renal origin: Secondary | ICD-10-CM | POA: Diagnosis not present

## 2017-09-05 DIAGNOSIS — E1142 Type 2 diabetes mellitus with diabetic polyneuropathy: Secondary | ICD-10-CM | POA: Diagnosis not present

## 2017-09-05 DIAGNOSIS — E1151 Type 2 diabetes mellitus with diabetic peripheral angiopathy without gangrene: Secondary | ICD-10-CM | POA: Diagnosis not present

## 2017-09-05 DIAGNOSIS — E1122 Type 2 diabetes mellitus with diabetic chronic kidney disease: Secondary | ICD-10-CM | POA: Diagnosis not present

## 2017-09-07 DIAGNOSIS — D631 Anemia in chronic kidney disease: Secondary | ICD-10-CM | POA: Diagnosis not present

## 2017-09-07 DIAGNOSIS — N2581 Secondary hyperparathyroidism of renal origin: Secondary | ICD-10-CM | POA: Diagnosis not present

## 2017-09-07 DIAGNOSIS — N186 End stage renal disease: Secondary | ICD-10-CM | POA: Diagnosis not present

## 2017-09-07 DIAGNOSIS — D509 Iron deficiency anemia, unspecified: Secondary | ICD-10-CM | POA: Diagnosis not present

## 2017-09-07 DIAGNOSIS — Z992 Dependence on renal dialysis: Secondary | ICD-10-CM | POA: Diagnosis not present

## 2017-09-09 ENCOUNTER — Ambulatory Visit (HOSPITAL_COMMUNITY)
Admission: RE | Admit: 2017-09-09 | Payer: Medicare Other | Source: Ambulatory Visit | Attending: Cardiovascular Disease | Admitting: Cardiovascular Disease

## 2017-09-10 DIAGNOSIS — D631 Anemia in chronic kidney disease: Secondary | ICD-10-CM | POA: Diagnosis not present

## 2017-09-10 DIAGNOSIS — N186 End stage renal disease: Secondary | ICD-10-CM | POA: Diagnosis not present

## 2017-09-10 DIAGNOSIS — D509 Iron deficiency anemia, unspecified: Secondary | ICD-10-CM | POA: Diagnosis not present

## 2017-09-10 DIAGNOSIS — Z992 Dependence on renal dialysis: Secondary | ICD-10-CM | POA: Diagnosis not present

## 2017-09-10 DIAGNOSIS — N2581 Secondary hyperparathyroidism of renal origin: Secondary | ICD-10-CM | POA: Diagnosis not present

## 2017-09-11 DIAGNOSIS — N185 Chronic kidney disease, stage 5: Secondary | ICD-10-CM | POA: Diagnosis not present

## 2017-09-11 DIAGNOSIS — E1151 Type 2 diabetes mellitus with diabetic peripheral angiopathy without gangrene: Secondary | ICD-10-CM | POA: Diagnosis not present

## 2017-09-11 DIAGNOSIS — E1122 Type 2 diabetes mellitus with diabetic chronic kidney disease: Secondary | ICD-10-CM | POA: Diagnosis not present

## 2017-09-11 DIAGNOSIS — E1142 Type 2 diabetes mellitus with diabetic polyneuropathy: Secondary | ICD-10-CM | POA: Diagnosis not present

## 2017-09-11 DIAGNOSIS — I120 Hypertensive chronic kidney disease with stage 5 chronic kidney disease or end stage renal disease: Secondary | ICD-10-CM | POA: Diagnosis not present

## 2017-09-11 DIAGNOSIS — L89623 Pressure ulcer of left heel, stage 3: Secondary | ICD-10-CM | POA: Diagnosis not present

## 2017-09-12 DIAGNOSIS — D509 Iron deficiency anemia, unspecified: Secondary | ICD-10-CM | POA: Diagnosis not present

## 2017-09-12 DIAGNOSIS — N186 End stage renal disease: Secondary | ICD-10-CM | POA: Diagnosis not present

## 2017-09-12 DIAGNOSIS — N2581 Secondary hyperparathyroidism of renal origin: Secondary | ICD-10-CM | POA: Diagnosis not present

## 2017-09-12 DIAGNOSIS — D631 Anemia in chronic kidney disease: Secondary | ICD-10-CM | POA: Diagnosis not present

## 2017-09-12 DIAGNOSIS — Z992 Dependence on renal dialysis: Secondary | ICD-10-CM | POA: Diagnosis not present

## 2017-09-13 DIAGNOSIS — D631 Anemia in chronic kidney disease: Secondary | ICD-10-CM | POA: Diagnosis not present

## 2017-09-13 DIAGNOSIS — D509 Iron deficiency anemia, unspecified: Secondary | ICD-10-CM | POA: Diagnosis not present

## 2017-09-13 DIAGNOSIS — N2581 Secondary hyperparathyroidism of renal origin: Secondary | ICD-10-CM | POA: Diagnosis not present

## 2017-09-13 DIAGNOSIS — Z992 Dependence on renal dialysis: Secondary | ICD-10-CM | POA: Diagnosis not present

## 2017-09-13 DIAGNOSIS — N186 End stage renal disease: Secondary | ICD-10-CM | POA: Diagnosis not present

## 2017-09-14 DIAGNOSIS — N186 End stage renal disease: Secondary | ICD-10-CM | POA: Diagnosis not present

## 2017-09-14 DIAGNOSIS — N2581 Secondary hyperparathyroidism of renal origin: Secondary | ICD-10-CM | POA: Diagnosis not present

## 2017-09-14 DIAGNOSIS — Z992 Dependence on renal dialysis: Secondary | ICD-10-CM | POA: Diagnosis not present

## 2017-09-14 DIAGNOSIS — D631 Anemia in chronic kidney disease: Secondary | ICD-10-CM | POA: Diagnosis not present

## 2017-09-14 DIAGNOSIS — D509 Iron deficiency anemia, unspecified: Secondary | ICD-10-CM | POA: Diagnosis not present

## 2017-09-17 DIAGNOSIS — N186 End stage renal disease: Secondary | ICD-10-CM | POA: Diagnosis not present

## 2017-09-17 DIAGNOSIS — D631 Anemia in chronic kidney disease: Secondary | ICD-10-CM | POA: Diagnosis not present

## 2017-09-17 DIAGNOSIS — D509 Iron deficiency anemia, unspecified: Secondary | ICD-10-CM | POA: Diagnosis not present

## 2017-09-17 DIAGNOSIS — N2581 Secondary hyperparathyroidism of renal origin: Secondary | ICD-10-CM | POA: Diagnosis not present

## 2017-09-17 DIAGNOSIS — Z992 Dependence on renal dialysis: Secondary | ICD-10-CM | POA: Diagnosis not present

## 2017-09-18 DIAGNOSIS — I120 Hypertensive chronic kidney disease with stage 5 chronic kidney disease or end stage renal disease: Secondary | ICD-10-CM | POA: Diagnosis not present

## 2017-09-18 DIAGNOSIS — E1142 Type 2 diabetes mellitus with diabetic polyneuropathy: Secondary | ICD-10-CM | POA: Diagnosis not present

## 2017-09-18 DIAGNOSIS — E1151 Type 2 diabetes mellitus with diabetic peripheral angiopathy without gangrene: Secondary | ICD-10-CM | POA: Diagnosis not present

## 2017-09-18 DIAGNOSIS — E1122 Type 2 diabetes mellitus with diabetic chronic kidney disease: Secondary | ICD-10-CM | POA: Diagnosis not present

## 2017-09-18 DIAGNOSIS — L89623 Pressure ulcer of left heel, stage 3: Secondary | ICD-10-CM | POA: Diagnosis not present

## 2017-09-18 DIAGNOSIS — N185 Chronic kidney disease, stage 5: Secondary | ICD-10-CM | POA: Diagnosis not present

## 2017-09-19 DIAGNOSIS — D509 Iron deficiency anemia, unspecified: Secondary | ICD-10-CM | POA: Diagnosis not present

## 2017-09-19 DIAGNOSIS — N186 End stage renal disease: Secondary | ICD-10-CM | POA: Diagnosis not present

## 2017-09-19 DIAGNOSIS — D631 Anemia in chronic kidney disease: Secondary | ICD-10-CM | POA: Diagnosis not present

## 2017-09-19 DIAGNOSIS — Z992 Dependence on renal dialysis: Secondary | ICD-10-CM | POA: Diagnosis not present

## 2017-09-19 DIAGNOSIS — N2581 Secondary hyperparathyroidism of renal origin: Secondary | ICD-10-CM | POA: Diagnosis not present

## 2017-09-21 DIAGNOSIS — D509 Iron deficiency anemia, unspecified: Secondary | ICD-10-CM | POA: Diagnosis not present

## 2017-09-21 DIAGNOSIS — N186 End stage renal disease: Secondary | ICD-10-CM | POA: Diagnosis not present

## 2017-09-21 DIAGNOSIS — D631 Anemia in chronic kidney disease: Secondary | ICD-10-CM | POA: Diagnosis not present

## 2017-09-21 DIAGNOSIS — N2581 Secondary hyperparathyroidism of renal origin: Secondary | ICD-10-CM | POA: Diagnosis not present

## 2017-09-21 DIAGNOSIS — Z992 Dependence on renal dialysis: Secondary | ICD-10-CM | POA: Diagnosis not present

## 2017-09-23 DIAGNOSIS — E1151 Type 2 diabetes mellitus with diabetic peripheral angiopathy without gangrene: Secondary | ICD-10-CM | POA: Diagnosis not present

## 2017-09-23 DIAGNOSIS — N185 Chronic kidney disease, stage 5: Secondary | ICD-10-CM | POA: Diagnosis not present

## 2017-09-23 DIAGNOSIS — I120 Hypertensive chronic kidney disease with stage 5 chronic kidney disease or end stage renal disease: Secondary | ICD-10-CM | POA: Diagnosis not present

## 2017-09-23 DIAGNOSIS — E1142 Type 2 diabetes mellitus with diabetic polyneuropathy: Secondary | ICD-10-CM | POA: Diagnosis not present

## 2017-09-23 DIAGNOSIS — E1122 Type 2 diabetes mellitus with diabetic chronic kidney disease: Secondary | ICD-10-CM | POA: Diagnosis not present

## 2017-09-23 DIAGNOSIS — L89623 Pressure ulcer of left heel, stage 3: Secondary | ICD-10-CM | POA: Diagnosis not present

## 2017-09-24 DIAGNOSIS — D509 Iron deficiency anemia, unspecified: Secondary | ICD-10-CM | POA: Diagnosis not present

## 2017-09-24 DIAGNOSIS — D631 Anemia in chronic kidney disease: Secondary | ICD-10-CM | POA: Diagnosis not present

## 2017-09-24 DIAGNOSIS — Z992 Dependence on renal dialysis: Secondary | ICD-10-CM | POA: Diagnosis not present

## 2017-09-24 DIAGNOSIS — N186 End stage renal disease: Secondary | ICD-10-CM | POA: Diagnosis not present

## 2017-09-24 DIAGNOSIS — N2581 Secondary hyperparathyroidism of renal origin: Secondary | ICD-10-CM | POA: Diagnosis not present

## 2017-09-26 DIAGNOSIS — Z992 Dependence on renal dialysis: Secondary | ICD-10-CM | POA: Diagnosis not present

## 2017-09-26 DIAGNOSIS — N186 End stage renal disease: Secondary | ICD-10-CM | POA: Diagnosis not present

## 2017-09-26 DIAGNOSIS — D509 Iron deficiency anemia, unspecified: Secondary | ICD-10-CM | POA: Diagnosis not present

## 2017-09-26 DIAGNOSIS — D631 Anemia in chronic kidney disease: Secondary | ICD-10-CM | POA: Diagnosis not present

## 2017-09-26 DIAGNOSIS — N2581 Secondary hyperparathyroidism of renal origin: Secondary | ICD-10-CM | POA: Diagnosis not present

## 2017-09-28 DIAGNOSIS — N186 End stage renal disease: Secondary | ICD-10-CM | POA: Diagnosis not present

## 2017-09-28 DIAGNOSIS — D631 Anemia in chronic kidney disease: Secondary | ICD-10-CM | POA: Diagnosis not present

## 2017-09-28 DIAGNOSIS — Z992 Dependence on renal dialysis: Secondary | ICD-10-CM | POA: Diagnosis not present

## 2017-09-28 DIAGNOSIS — N2581 Secondary hyperparathyroidism of renal origin: Secondary | ICD-10-CM | POA: Diagnosis not present

## 2017-09-28 DIAGNOSIS — D509 Iron deficiency anemia, unspecified: Secondary | ICD-10-CM | POA: Diagnosis not present

## 2017-10-01 DIAGNOSIS — D631 Anemia in chronic kidney disease: Secondary | ICD-10-CM | POA: Diagnosis not present

## 2017-10-01 DIAGNOSIS — E1151 Type 2 diabetes mellitus with diabetic peripheral angiopathy without gangrene: Secondary | ICD-10-CM | POA: Diagnosis not present

## 2017-10-01 DIAGNOSIS — L89623 Pressure ulcer of left heel, stage 3: Secondary | ICD-10-CM | POA: Diagnosis not present

## 2017-10-01 DIAGNOSIS — E1142 Type 2 diabetes mellitus with diabetic polyneuropathy: Secondary | ICD-10-CM | POA: Diagnosis not present

## 2017-10-01 DIAGNOSIS — Z992 Dependence on renal dialysis: Secondary | ICD-10-CM | POA: Diagnosis not present

## 2017-10-01 DIAGNOSIS — D509 Iron deficiency anemia, unspecified: Secondary | ICD-10-CM | POA: Diagnosis not present

## 2017-10-01 DIAGNOSIS — N2581 Secondary hyperparathyroidism of renal origin: Secondary | ICD-10-CM | POA: Diagnosis not present

## 2017-10-01 DIAGNOSIS — N186 End stage renal disease: Secondary | ICD-10-CM | POA: Diagnosis not present

## 2017-10-01 DIAGNOSIS — E1122 Type 2 diabetes mellitus with diabetic chronic kidney disease: Secondary | ICD-10-CM | POA: Diagnosis not present

## 2017-10-02 DIAGNOSIS — E1142 Type 2 diabetes mellitus with diabetic polyneuropathy: Secondary | ICD-10-CM | POA: Diagnosis not present

## 2017-10-02 DIAGNOSIS — L89623 Pressure ulcer of left heel, stage 3: Secondary | ICD-10-CM | POA: Diagnosis not present

## 2017-10-02 DIAGNOSIS — I120 Hypertensive chronic kidney disease with stage 5 chronic kidney disease or end stage renal disease: Secondary | ICD-10-CM | POA: Diagnosis not present

## 2017-10-02 DIAGNOSIS — E1151 Type 2 diabetes mellitus with diabetic peripheral angiopathy without gangrene: Secondary | ICD-10-CM | POA: Diagnosis not present

## 2017-10-02 DIAGNOSIS — N185 Chronic kidney disease, stage 5: Secondary | ICD-10-CM | POA: Diagnosis not present

## 2017-10-02 DIAGNOSIS — E1122 Type 2 diabetes mellitus with diabetic chronic kidney disease: Secondary | ICD-10-CM | POA: Diagnosis not present

## 2017-10-03 DIAGNOSIS — N186 End stage renal disease: Secondary | ICD-10-CM | POA: Diagnosis not present

## 2017-10-03 DIAGNOSIS — D631 Anemia in chronic kidney disease: Secondary | ICD-10-CM | POA: Diagnosis not present

## 2017-10-03 DIAGNOSIS — Z992 Dependence on renal dialysis: Secondary | ICD-10-CM | POA: Diagnosis not present

## 2017-10-03 DIAGNOSIS — D509 Iron deficiency anemia, unspecified: Secondary | ICD-10-CM | POA: Diagnosis not present

## 2017-10-03 DIAGNOSIS — N2581 Secondary hyperparathyroidism of renal origin: Secondary | ICD-10-CM | POA: Diagnosis not present

## 2017-10-05 DIAGNOSIS — Z992 Dependence on renal dialysis: Secondary | ICD-10-CM | POA: Diagnosis not present

## 2017-10-05 DIAGNOSIS — D509 Iron deficiency anemia, unspecified: Secondary | ICD-10-CM | POA: Diagnosis not present

## 2017-10-05 DIAGNOSIS — N2581 Secondary hyperparathyroidism of renal origin: Secondary | ICD-10-CM | POA: Diagnosis not present

## 2017-10-05 DIAGNOSIS — N186 End stage renal disease: Secondary | ICD-10-CM | POA: Diagnosis not present

## 2017-10-05 DIAGNOSIS — D631 Anemia in chronic kidney disease: Secondary | ICD-10-CM | POA: Diagnosis not present

## 2017-10-08 DIAGNOSIS — N186 End stage renal disease: Secondary | ICD-10-CM | POA: Diagnosis not present

## 2017-10-08 DIAGNOSIS — Z992 Dependence on renal dialysis: Secondary | ICD-10-CM | POA: Diagnosis not present

## 2017-10-08 DIAGNOSIS — N2581 Secondary hyperparathyroidism of renal origin: Secondary | ICD-10-CM | POA: Diagnosis not present

## 2017-10-08 DIAGNOSIS — D509 Iron deficiency anemia, unspecified: Secondary | ICD-10-CM | POA: Diagnosis not present

## 2017-10-08 DIAGNOSIS — D631 Anemia in chronic kidney disease: Secondary | ICD-10-CM | POA: Diagnosis not present

## 2017-10-09 DIAGNOSIS — L89623 Pressure ulcer of left heel, stage 3: Secondary | ICD-10-CM | POA: Diagnosis not present

## 2017-10-09 DIAGNOSIS — N185 Chronic kidney disease, stage 5: Secondary | ICD-10-CM | POA: Diagnosis not present

## 2017-10-09 DIAGNOSIS — E1151 Type 2 diabetes mellitus with diabetic peripheral angiopathy without gangrene: Secondary | ICD-10-CM | POA: Diagnosis not present

## 2017-10-09 DIAGNOSIS — E1122 Type 2 diabetes mellitus with diabetic chronic kidney disease: Secondary | ICD-10-CM | POA: Diagnosis not present

## 2017-10-09 DIAGNOSIS — I120 Hypertensive chronic kidney disease with stage 5 chronic kidney disease or end stage renal disease: Secondary | ICD-10-CM | POA: Diagnosis not present

## 2017-10-09 DIAGNOSIS — E1142 Type 2 diabetes mellitus with diabetic polyneuropathy: Secondary | ICD-10-CM | POA: Diagnosis not present

## 2017-10-10 DIAGNOSIS — D631 Anemia in chronic kidney disease: Secondary | ICD-10-CM | POA: Diagnosis not present

## 2017-10-10 DIAGNOSIS — N2581 Secondary hyperparathyroidism of renal origin: Secondary | ICD-10-CM | POA: Diagnosis not present

## 2017-10-10 DIAGNOSIS — Z992 Dependence on renal dialysis: Secondary | ICD-10-CM | POA: Diagnosis not present

## 2017-10-10 DIAGNOSIS — D509 Iron deficiency anemia, unspecified: Secondary | ICD-10-CM | POA: Diagnosis not present

## 2017-10-10 DIAGNOSIS — N186 End stage renal disease: Secondary | ICD-10-CM | POA: Diagnosis not present

## 2017-10-11 DIAGNOSIS — N186 End stage renal disease: Secondary | ICD-10-CM | POA: Diagnosis not present

## 2017-10-11 DIAGNOSIS — D509 Iron deficiency anemia, unspecified: Secondary | ICD-10-CM | POA: Diagnosis not present

## 2017-10-11 DIAGNOSIS — N2581 Secondary hyperparathyroidism of renal origin: Secondary | ICD-10-CM | POA: Diagnosis not present

## 2017-10-11 DIAGNOSIS — D631 Anemia in chronic kidney disease: Secondary | ICD-10-CM | POA: Diagnosis not present

## 2017-10-11 DIAGNOSIS — Z992 Dependence on renal dialysis: Secondary | ICD-10-CM | POA: Diagnosis not present

## 2017-10-12 DIAGNOSIS — D509 Iron deficiency anemia, unspecified: Secondary | ICD-10-CM | POA: Diagnosis not present

## 2017-10-12 DIAGNOSIS — N2581 Secondary hyperparathyroidism of renal origin: Secondary | ICD-10-CM | POA: Diagnosis not present

## 2017-10-12 DIAGNOSIS — Z992 Dependence on renal dialysis: Secondary | ICD-10-CM | POA: Diagnosis not present

## 2017-10-12 DIAGNOSIS — N186 End stage renal disease: Secondary | ICD-10-CM | POA: Diagnosis not present

## 2017-10-12 DIAGNOSIS — D631 Anemia in chronic kidney disease: Secondary | ICD-10-CM | POA: Diagnosis not present

## 2017-10-13 DIAGNOSIS — D509 Iron deficiency anemia, unspecified: Secondary | ICD-10-CM | POA: Diagnosis not present

## 2017-10-13 DIAGNOSIS — D631 Anemia in chronic kidney disease: Secondary | ICD-10-CM | POA: Diagnosis not present

## 2017-10-13 DIAGNOSIS — N186 End stage renal disease: Secondary | ICD-10-CM | POA: Diagnosis not present

## 2017-10-13 DIAGNOSIS — N2581 Secondary hyperparathyroidism of renal origin: Secondary | ICD-10-CM | POA: Diagnosis not present

## 2017-10-13 DIAGNOSIS — Z992 Dependence on renal dialysis: Secondary | ICD-10-CM | POA: Diagnosis not present

## 2017-10-15 DIAGNOSIS — Z992 Dependence on renal dialysis: Secondary | ICD-10-CM | POA: Diagnosis not present

## 2017-10-15 DIAGNOSIS — N186 End stage renal disease: Secondary | ICD-10-CM | POA: Diagnosis not present

## 2017-10-15 DIAGNOSIS — N2581 Secondary hyperparathyroidism of renal origin: Secondary | ICD-10-CM | POA: Diagnosis not present

## 2017-10-15 DIAGNOSIS — D631 Anemia in chronic kidney disease: Secondary | ICD-10-CM | POA: Diagnosis not present

## 2017-10-15 DIAGNOSIS — D509 Iron deficiency anemia, unspecified: Secondary | ICD-10-CM | POA: Diagnosis not present

## 2017-10-16 DIAGNOSIS — I120 Hypertensive chronic kidney disease with stage 5 chronic kidney disease or end stage renal disease: Secondary | ICD-10-CM | POA: Diagnosis not present

## 2017-10-16 DIAGNOSIS — E1122 Type 2 diabetes mellitus with diabetic chronic kidney disease: Secondary | ICD-10-CM | POA: Diagnosis not present

## 2017-10-16 DIAGNOSIS — E1151 Type 2 diabetes mellitus with diabetic peripheral angiopathy without gangrene: Secondary | ICD-10-CM | POA: Diagnosis not present

## 2017-10-16 DIAGNOSIS — E1142 Type 2 diabetes mellitus with diabetic polyneuropathy: Secondary | ICD-10-CM | POA: Diagnosis not present

## 2017-10-16 DIAGNOSIS — L89623 Pressure ulcer of left heel, stage 3: Secondary | ICD-10-CM | POA: Diagnosis not present

## 2017-10-16 DIAGNOSIS — N185 Chronic kidney disease, stage 5: Secondary | ICD-10-CM | POA: Diagnosis not present

## 2017-10-17 DIAGNOSIS — N2581 Secondary hyperparathyroidism of renal origin: Secondary | ICD-10-CM | POA: Diagnosis not present

## 2017-10-17 DIAGNOSIS — D631 Anemia in chronic kidney disease: Secondary | ICD-10-CM | POA: Diagnosis not present

## 2017-10-17 DIAGNOSIS — N186 End stage renal disease: Secondary | ICD-10-CM | POA: Diagnosis not present

## 2017-10-17 DIAGNOSIS — D509 Iron deficiency anemia, unspecified: Secondary | ICD-10-CM | POA: Diagnosis not present

## 2017-10-17 DIAGNOSIS — Z992 Dependence on renal dialysis: Secondary | ICD-10-CM | POA: Diagnosis not present

## 2017-10-19 DIAGNOSIS — D509 Iron deficiency anemia, unspecified: Secondary | ICD-10-CM | POA: Diagnosis not present

## 2017-10-19 DIAGNOSIS — D631 Anemia in chronic kidney disease: Secondary | ICD-10-CM | POA: Diagnosis not present

## 2017-10-19 DIAGNOSIS — Z992 Dependence on renal dialysis: Secondary | ICD-10-CM | POA: Diagnosis not present

## 2017-10-19 DIAGNOSIS — N186 End stage renal disease: Secondary | ICD-10-CM | POA: Diagnosis not present

## 2017-10-19 DIAGNOSIS — N2581 Secondary hyperparathyroidism of renal origin: Secondary | ICD-10-CM | POA: Diagnosis not present

## 2017-10-22 DIAGNOSIS — N2581 Secondary hyperparathyroidism of renal origin: Secondary | ICD-10-CM | POA: Diagnosis not present

## 2017-10-22 DIAGNOSIS — N186 End stage renal disease: Secondary | ICD-10-CM | POA: Diagnosis not present

## 2017-10-22 DIAGNOSIS — D509 Iron deficiency anemia, unspecified: Secondary | ICD-10-CM | POA: Diagnosis not present

## 2017-10-22 DIAGNOSIS — Z992 Dependence on renal dialysis: Secondary | ICD-10-CM | POA: Diagnosis not present

## 2017-10-22 DIAGNOSIS — D631 Anemia in chronic kidney disease: Secondary | ICD-10-CM | POA: Diagnosis not present

## 2017-10-23 DIAGNOSIS — E1122 Type 2 diabetes mellitus with diabetic chronic kidney disease: Secondary | ICD-10-CM | POA: Diagnosis not present

## 2017-10-23 DIAGNOSIS — E1151 Type 2 diabetes mellitus with diabetic peripheral angiopathy without gangrene: Secondary | ICD-10-CM | POA: Diagnosis not present

## 2017-10-23 DIAGNOSIS — N185 Chronic kidney disease, stage 5: Secondary | ICD-10-CM | POA: Diagnosis not present

## 2017-10-23 DIAGNOSIS — L89623 Pressure ulcer of left heel, stage 3: Secondary | ICD-10-CM | POA: Diagnosis not present

## 2017-10-23 DIAGNOSIS — E1142 Type 2 diabetes mellitus with diabetic polyneuropathy: Secondary | ICD-10-CM | POA: Diagnosis not present

## 2017-10-23 DIAGNOSIS — I120 Hypertensive chronic kidney disease with stage 5 chronic kidney disease or end stage renal disease: Secondary | ICD-10-CM | POA: Diagnosis not present

## 2017-10-24 DIAGNOSIS — D509 Iron deficiency anemia, unspecified: Secondary | ICD-10-CM | POA: Diagnosis not present

## 2017-10-24 DIAGNOSIS — N2581 Secondary hyperparathyroidism of renal origin: Secondary | ICD-10-CM | POA: Diagnosis not present

## 2017-10-24 DIAGNOSIS — Z992 Dependence on renal dialysis: Secondary | ICD-10-CM | POA: Diagnosis not present

## 2017-10-24 DIAGNOSIS — N186 End stage renal disease: Secondary | ICD-10-CM | POA: Diagnosis not present

## 2017-10-24 DIAGNOSIS — D631 Anemia in chronic kidney disease: Secondary | ICD-10-CM | POA: Diagnosis not present

## 2017-10-26 DIAGNOSIS — D631 Anemia in chronic kidney disease: Secondary | ICD-10-CM | POA: Diagnosis not present

## 2017-10-26 DIAGNOSIS — Z992 Dependence on renal dialysis: Secondary | ICD-10-CM | POA: Diagnosis not present

## 2017-10-26 DIAGNOSIS — D509 Iron deficiency anemia, unspecified: Secondary | ICD-10-CM | POA: Diagnosis not present

## 2017-10-26 DIAGNOSIS — N2581 Secondary hyperparathyroidism of renal origin: Secondary | ICD-10-CM | POA: Diagnosis not present

## 2017-10-26 DIAGNOSIS — N186 End stage renal disease: Secondary | ICD-10-CM | POA: Diagnosis not present

## 2017-10-28 DIAGNOSIS — I120 Hypertensive chronic kidney disease with stage 5 chronic kidney disease or end stage renal disease: Secondary | ICD-10-CM | POA: Diagnosis not present

## 2017-10-28 DIAGNOSIS — E1122 Type 2 diabetes mellitus with diabetic chronic kidney disease: Secondary | ICD-10-CM | POA: Diagnosis not present

## 2017-10-28 DIAGNOSIS — E1151 Type 2 diabetes mellitus with diabetic peripheral angiopathy without gangrene: Secondary | ICD-10-CM | POA: Diagnosis not present

## 2017-10-28 DIAGNOSIS — N185 Chronic kidney disease, stage 5: Secondary | ICD-10-CM | POA: Diagnosis not present

## 2017-10-28 DIAGNOSIS — L89623 Pressure ulcer of left heel, stage 3: Secondary | ICD-10-CM | POA: Diagnosis not present

## 2017-10-28 DIAGNOSIS — E1142 Type 2 diabetes mellitus with diabetic polyneuropathy: Secondary | ICD-10-CM | POA: Diagnosis not present

## 2017-10-29 DIAGNOSIS — F411 Generalized anxiety disorder: Secondary | ICD-10-CM | POA: Diagnosis not present

## 2017-10-29 DIAGNOSIS — N185 Chronic kidney disease, stage 5: Secondary | ICD-10-CM | POA: Diagnosis not present

## 2017-10-29 DIAGNOSIS — N2581 Secondary hyperparathyroidism of renal origin: Secondary | ICD-10-CM | POA: Diagnosis not present

## 2017-10-29 DIAGNOSIS — Z992 Dependence on renal dialysis: Secondary | ICD-10-CM | POA: Diagnosis not present

## 2017-10-29 DIAGNOSIS — N186 End stage renal disease: Secondary | ICD-10-CM | POA: Diagnosis not present

## 2017-10-29 DIAGNOSIS — G8929 Other chronic pain: Secondary | ICD-10-CM | POA: Diagnosis not present

## 2017-10-29 DIAGNOSIS — D631 Anemia in chronic kidney disease: Secondary | ICD-10-CM | POA: Diagnosis not present

## 2017-10-29 DIAGNOSIS — Z7982 Long term (current) use of aspirin: Secondary | ICD-10-CM | POA: Diagnosis not present

## 2017-10-29 DIAGNOSIS — F339 Major depressive disorder, recurrent, unspecified: Secondary | ICD-10-CM | POA: Diagnosis not present

## 2017-10-29 DIAGNOSIS — E1151 Type 2 diabetes mellitus with diabetic peripheral angiopathy without gangrene: Secondary | ICD-10-CM | POA: Diagnosis not present

## 2017-10-29 DIAGNOSIS — M47816 Spondylosis without myelopathy or radiculopathy, lumbar region: Secondary | ICD-10-CM | POA: Diagnosis not present

## 2017-10-29 DIAGNOSIS — Z7902 Long term (current) use of antithrombotics/antiplatelets: Secondary | ICD-10-CM | POA: Diagnosis not present

## 2017-10-29 DIAGNOSIS — E1122 Type 2 diabetes mellitus with diabetic chronic kidney disease: Secondary | ICD-10-CM | POA: Diagnosis not present

## 2017-10-29 DIAGNOSIS — I120 Hypertensive chronic kidney disease with stage 5 chronic kidney disease or end stage renal disease: Secondary | ICD-10-CM | POA: Diagnosis not present

## 2017-10-29 DIAGNOSIS — Z8673 Personal history of transient ischemic attack (TIA), and cerebral infarction without residual deficits: Secondary | ICD-10-CM | POA: Diagnosis not present

## 2017-10-29 DIAGNOSIS — L89623 Pressure ulcer of left heel, stage 3: Secondary | ICD-10-CM | POA: Diagnosis not present

## 2017-10-29 DIAGNOSIS — D509 Iron deficiency anemia, unspecified: Secondary | ICD-10-CM | POA: Diagnosis not present

## 2017-10-29 DIAGNOSIS — Z4800 Encounter for change or removal of nonsurgical wound dressing: Secondary | ICD-10-CM | POA: Diagnosis not present

## 2017-10-29 DIAGNOSIS — Z794 Long term (current) use of insulin: Secondary | ICD-10-CM | POA: Diagnosis not present

## 2017-10-29 DIAGNOSIS — Z87891 Personal history of nicotine dependence: Secondary | ICD-10-CM | POA: Diagnosis not present

## 2017-10-29 DIAGNOSIS — E1142 Type 2 diabetes mellitus with diabetic polyneuropathy: Secondary | ICD-10-CM | POA: Diagnosis not present

## 2017-10-29 DIAGNOSIS — Z7951 Long term (current) use of inhaled steroids: Secondary | ICD-10-CM | POA: Diagnosis not present

## 2017-10-31 DIAGNOSIS — Z992 Dependence on renal dialysis: Secondary | ICD-10-CM | POA: Diagnosis not present

## 2017-10-31 DIAGNOSIS — D631 Anemia in chronic kidney disease: Secondary | ICD-10-CM | POA: Diagnosis not present

## 2017-10-31 DIAGNOSIS — D509 Iron deficiency anemia, unspecified: Secondary | ICD-10-CM | POA: Diagnosis not present

## 2017-10-31 DIAGNOSIS — N186 End stage renal disease: Secondary | ICD-10-CM | POA: Diagnosis not present

## 2017-10-31 DIAGNOSIS — N2581 Secondary hyperparathyroidism of renal origin: Secondary | ICD-10-CM | POA: Diagnosis not present

## 2017-11-02 DIAGNOSIS — D631 Anemia in chronic kidney disease: Secondary | ICD-10-CM | POA: Diagnosis not present

## 2017-11-02 DIAGNOSIS — D509 Iron deficiency anemia, unspecified: Secondary | ICD-10-CM | POA: Diagnosis not present

## 2017-11-02 DIAGNOSIS — N2581 Secondary hyperparathyroidism of renal origin: Secondary | ICD-10-CM | POA: Diagnosis not present

## 2017-11-02 DIAGNOSIS — N186 End stage renal disease: Secondary | ICD-10-CM | POA: Diagnosis not present

## 2017-11-02 DIAGNOSIS — Z992 Dependence on renal dialysis: Secondary | ICD-10-CM | POA: Diagnosis not present

## 2017-11-04 DIAGNOSIS — E1122 Type 2 diabetes mellitus with diabetic chronic kidney disease: Secondary | ICD-10-CM | POA: Diagnosis not present

## 2017-11-04 DIAGNOSIS — E1142 Type 2 diabetes mellitus with diabetic polyneuropathy: Secondary | ICD-10-CM | POA: Diagnosis not present

## 2017-11-04 DIAGNOSIS — I120 Hypertensive chronic kidney disease with stage 5 chronic kidney disease or end stage renal disease: Secondary | ICD-10-CM | POA: Diagnosis not present

## 2017-11-04 DIAGNOSIS — L89623 Pressure ulcer of left heel, stage 3: Secondary | ICD-10-CM | POA: Diagnosis not present

## 2017-11-04 DIAGNOSIS — N185 Chronic kidney disease, stage 5: Secondary | ICD-10-CM | POA: Diagnosis not present

## 2017-11-04 DIAGNOSIS — E1151 Type 2 diabetes mellitus with diabetic peripheral angiopathy without gangrene: Secondary | ICD-10-CM | POA: Diagnosis not present

## 2017-11-05 DIAGNOSIS — N186 End stage renal disease: Secondary | ICD-10-CM | POA: Diagnosis not present

## 2017-11-05 DIAGNOSIS — L89623 Pressure ulcer of left heel, stage 3: Secondary | ICD-10-CM | POA: Diagnosis not present

## 2017-11-05 DIAGNOSIS — E119 Type 2 diabetes mellitus without complications: Secondary | ICD-10-CM | POA: Diagnosis not present

## 2017-11-05 DIAGNOSIS — N2581 Secondary hyperparathyroidism of renal origin: Secondary | ICD-10-CM | POA: Diagnosis not present

## 2017-11-05 DIAGNOSIS — E1122 Type 2 diabetes mellitus with diabetic chronic kidney disease: Secondary | ICD-10-CM | POA: Diagnosis not present

## 2017-11-05 DIAGNOSIS — Z794 Long term (current) use of insulin: Secondary | ICD-10-CM | POA: Diagnosis not present

## 2017-11-05 DIAGNOSIS — E1142 Type 2 diabetes mellitus with diabetic polyneuropathy: Secondary | ICD-10-CM | POA: Diagnosis not present

## 2017-11-05 DIAGNOSIS — E1151 Type 2 diabetes mellitus with diabetic peripheral angiopathy without gangrene: Secondary | ICD-10-CM | POA: Diagnosis not present

## 2017-11-05 DIAGNOSIS — D631 Anemia in chronic kidney disease: Secondary | ICD-10-CM | POA: Diagnosis not present

## 2017-11-05 DIAGNOSIS — I120 Hypertensive chronic kidney disease with stage 5 chronic kidney disease or end stage renal disease: Secondary | ICD-10-CM | POA: Diagnosis not present

## 2017-11-05 DIAGNOSIS — D509 Iron deficiency anemia, unspecified: Secondary | ICD-10-CM | POA: Diagnosis not present

## 2017-11-05 DIAGNOSIS — Z992 Dependence on renal dialysis: Secondary | ICD-10-CM | POA: Diagnosis not present

## 2017-11-05 DIAGNOSIS — N185 Chronic kidney disease, stage 5: Secondary | ICD-10-CM | POA: Diagnosis not present

## 2017-11-07 DIAGNOSIS — N186 End stage renal disease: Secondary | ICD-10-CM | POA: Diagnosis not present

## 2017-11-07 DIAGNOSIS — D631 Anemia in chronic kidney disease: Secondary | ICD-10-CM | POA: Diagnosis not present

## 2017-11-07 DIAGNOSIS — Z992 Dependence on renal dialysis: Secondary | ICD-10-CM | POA: Diagnosis not present

## 2017-11-07 DIAGNOSIS — N2581 Secondary hyperparathyroidism of renal origin: Secondary | ICD-10-CM | POA: Diagnosis not present

## 2017-11-07 DIAGNOSIS — D509 Iron deficiency anemia, unspecified: Secondary | ICD-10-CM | POA: Diagnosis not present

## 2017-11-09 DIAGNOSIS — Z992 Dependence on renal dialysis: Secondary | ICD-10-CM | POA: Diagnosis not present

## 2017-11-09 DIAGNOSIS — D509 Iron deficiency anemia, unspecified: Secondary | ICD-10-CM | POA: Diagnosis not present

## 2017-11-09 DIAGNOSIS — N186 End stage renal disease: Secondary | ICD-10-CM | POA: Diagnosis not present

## 2017-11-09 DIAGNOSIS — N2581 Secondary hyperparathyroidism of renal origin: Secondary | ICD-10-CM | POA: Diagnosis not present

## 2017-11-09 DIAGNOSIS — D631 Anemia in chronic kidney disease: Secondary | ICD-10-CM | POA: Diagnosis not present

## 2017-11-10 DIAGNOSIS — N186 End stage renal disease: Secondary | ICD-10-CM | POA: Diagnosis not present

## 2017-11-10 DIAGNOSIS — Z992 Dependence on renal dialysis: Secondary | ICD-10-CM | POA: Diagnosis not present

## 2017-11-11 DIAGNOSIS — Z992 Dependence on renal dialysis: Secondary | ICD-10-CM | POA: Diagnosis not present

## 2017-11-11 DIAGNOSIS — N186 End stage renal disease: Secondary | ICD-10-CM | POA: Diagnosis not present

## 2017-11-11 DIAGNOSIS — D509 Iron deficiency anemia, unspecified: Secondary | ICD-10-CM | POA: Diagnosis not present

## 2017-11-11 DIAGNOSIS — N2581 Secondary hyperparathyroidism of renal origin: Secondary | ICD-10-CM | POA: Diagnosis not present

## 2017-11-11 DIAGNOSIS — D631 Anemia in chronic kidney disease: Secondary | ICD-10-CM | POA: Diagnosis not present

## 2017-11-12 DIAGNOSIS — Z992 Dependence on renal dialysis: Secondary | ICD-10-CM | POA: Diagnosis not present

## 2017-11-12 DIAGNOSIS — D509 Iron deficiency anemia, unspecified: Secondary | ICD-10-CM | POA: Diagnosis not present

## 2017-11-12 DIAGNOSIS — D631 Anemia in chronic kidney disease: Secondary | ICD-10-CM | POA: Diagnosis not present

## 2017-11-12 DIAGNOSIS — N186 End stage renal disease: Secondary | ICD-10-CM | POA: Diagnosis not present

## 2017-11-12 DIAGNOSIS — N2581 Secondary hyperparathyroidism of renal origin: Secondary | ICD-10-CM | POA: Diagnosis not present

## 2017-11-13 DIAGNOSIS — E11621 Type 2 diabetes mellitus with foot ulcer: Secondary | ICD-10-CM | POA: Diagnosis not present

## 2017-11-13 DIAGNOSIS — Z1331 Encounter for screening for depression: Secondary | ICD-10-CM | POA: Diagnosis not present

## 2017-11-13 DIAGNOSIS — N185 Chronic kidney disease, stage 5: Secondary | ICD-10-CM | POA: Diagnosis not present

## 2017-11-13 DIAGNOSIS — E1151 Type 2 diabetes mellitus with diabetic peripheral angiopathy without gangrene: Secondary | ICD-10-CM | POA: Diagnosis not present

## 2017-11-13 DIAGNOSIS — L89623 Pressure ulcer of left heel, stage 3: Secondary | ICD-10-CM | POA: Diagnosis not present

## 2017-11-13 DIAGNOSIS — I120 Hypertensive chronic kidney disease with stage 5 chronic kidney disease or end stage renal disease: Secondary | ICD-10-CM | POA: Diagnosis not present

## 2017-11-13 DIAGNOSIS — Z1389 Encounter for screening for other disorder: Secondary | ICD-10-CM | POA: Diagnosis not present

## 2017-11-13 DIAGNOSIS — Z1212 Encounter for screening for malignant neoplasm of rectum: Secondary | ICD-10-CM | POA: Diagnosis not present

## 2017-11-13 DIAGNOSIS — E1142 Type 2 diabetes mellitus with diabetic polyneuropathy: Secondary | ICD-10-CM | POA: Diagnosis not present

## 2017-11-13 DIAGNOSIS — I10 Essential (primary) hypertension: Secondary | ICD-10-CM | POA: Diagnosis not present

## 2017-11-13 DIAGNOSIS — E1122 Type 2 diabetes mellitus with diabetic chronic kidney disease: Secondary | ICD-10-CM | POA: Diagnosis not present

## 2017-11-13 DIAGNOSIS — Z992 Dependence on renal dialysis: Secondary | ICD-10-CM | POA: Diagnosis not present

## 2017-11-14 DIAGNOSIS — Z992 Dependence on renal dialysis: Secondary | ICD-10-CM | POA: Diagnosis not present

## 2017-11-14 DIAGNOSIS — N2581 Secondary hyperparathyroidism of renal origin: Secondary | ICD-10-CM | POA: Diagnosis not present

## 2017-11-14 DIAGNOSIS — D509 Iron deficiency anemia, unspecified: Secondary | ICD-10-CM | POA: Diagnosis not present

## 2017-11-14 DIAGNOSIS — N186 End stage renal disease: Secondary | ICD-10-CM | POA: Diagnosis not present

## 2017-11-14 DIAGNOSIS — D631 Anemia in chronic kidney disease: Secondary | ICD-10-CM | POA: Diagnosis not present

## 2017-11-16 DIAGNOSIS — D509 Iron deficiency anemia, unspecified: Secondary | ICD-10-CM | POA: Diagnosis not present

## 2017-11-16 DIAGNOSIS — N2581 Secondary hyperparathyroidism of renal origin: Secondary | ICD-10-CM | POA: Diagnosis not present

## 2017-11-16 DIAGNOSIS — Z992 Dependence on renal dialysis: Secondary | ICD-10-CM | POA: Diagnosis not present

## 2017-11-16 DIAGNOSIS — D631 Anemia in chronic kidney disease: Secondary | ICD-10-CM | POA: Diagnosis not present

## 2017-11-16 DIAGNOSIS — N186 End stage renal disease: Secondary | ICD-10-CM | POA: Diagnosis not present

## 2017-11-19 DIAGNOSIS — D631 Anemia in chronic kidney disease: Secondary | ICD-10-CM | POA: Diagnosis not present

## 2017-11-19 DIAGNOSIS — Z992 Dependence on renal dialysis: Secondary | ICD-10-CM | POA: Diagnosis not present

## 2017-11-19 DIAGNOSIS — N2581 Secondary hyperparathyroidism of renal origin: Secondary | ICD-10-CM | POA: Diagnosis not present

## 2017-11-19 DIAGNOSIS — N186 End stage renal disease: Secondary | ICD-10-CM | POA: Diagnosis not present

## 2017-11-19 DIAGNOSIS — D509 Iron deficiency anemia, unspecified: Secondary | ICD-10-CM | POA: Diagnosis not present

## 2017-11-19 DIAGNOSIS — K921 Melena: Secondary | ICD-10-CM | POA: Insufficient documentation

## 2017-11-20 DIAGNOSIS — N185 Chronic kidney disease, stage 5: Secondary | ICD-10-CM | POA: Diagnosis not present

## 2017-11-20 DIAGNOSIS — L89623 Pressure ulcer of left heel, stage 3: Secondary | ICD-10-CM | POA: Diagnosis not present

## 2017-11-20 DIAGNOSIS — E1122 Type 2 diabetes mellitus with diabetic chronic kidney disease: Secondary | ICD-10-CM | POA: Diagnosis not present

## 2017-11-20 DIAGNOSIS — E1151 Type 2 diabetes mellitus with diabetic peripheral angiopathy without gangrene: Secondary | ICD-10-CM | POA: Diagnosis not present

## 2017-11-20 DIAGNOSIS — E1142 Type 2 diabetes mellitus with diabetic polyneuropathy: Secondary | ICD-10-CM | POA: Diagnosis not present

## 2017-11-20 DIAGNOSIS — I120 Hypertensive chronic kidney disease with stage 5 chronic kidney disease or end stage renal disease: Secondary | ICD-10-CM | POA: Diagnosis not present

## 2017-11-21 DIAGNOSIS — N2581 Secondary hyperparathyroidism of renal origin: Secondary | ICD-10-CM | POA: Diagnosis not present

## 2017-11-21 DIAGNOSIS — N186 End stage renal disease: Secondary | ICD-10-CM | POA: Diagnosis not present

## 2017-11-21 DIAGNOSIS — D631 Anemia in chronic kidney disease: Secondary | ICD-10-CM | POA: Diagnosis not present

## 2017-11-21 DIAGNOSIS — Z992 Dependence on renal dialysis: Secondary | ICD-10-CM | POA: Diagnosis not present

## 2017-11-21 DIAGNOSIS — D509 Iron deficiency anemia, unspecified: Secondary | ICD-10-CM | POA: Diagnosis not present

## 2017-11-23 DIAGNOSIS — Z992 Dependence on renal dialysis: Secondary | ICD-10-CM | POA: Diagnosis not present

## 2017-11-23 DIAGNOSIS — D631 Anemia in chronic kidney disease: Secondary | ICD-10-CM | POA: Diagnosis not present

## 2017-11-23 DIAGNOSIS — D509 Iron deficiency anemia, unspecified: Secondary | ICD-10-CM | POA: Diagnosis not present

## 2017-11-23 DIAGNOSIS — N2581 Secondary hyperparathyroidism of renal origin: Secondary | ICD-10-CM | POA: Diagnosis not present

## 2017-11-23 DIAGNOSIS — N186 End stage renal disease: Secondary | ICD-10-CM | POA: Diagnosis not present

## 2017-11-26 DIAGNOSIS — Z992 Dependence on renal dialysis: Secondary | ICD-10-CM | POA: Diagnosis not present

## 2017-11-26 DIAGNOSIS — N2581 Secondary hyperparathyroidism of renal origin: Secondary | ICD-10-CM | POA: Diagnosis not present

## 2017-11-26 DIAGNOSIS — D509 Iron deficiency anemia, unspecified: Secondary | ICD-10-CM | POA: Diagnosis not present

## 2017-11-26 DIAGNOSIS — N186 End stage renal disease: Secondary | ICD-10-CM | POA: Diagnosis not present

## 2017-11-26 DIAGNOSIS — D631 Anemia in chronic kidney disease: Secondary | ICD-10-CM | POA: Diagnosis not present

## 2017-11-27 DIAGNOSIS — E1142 Type 2 diabetes mellitus with diabetic polyneuropathy: Secondary | ICD-10-CM | POA: Diagnosis not present

## 2017-11-27 DIAGNOSIS — L89623 Pressure ulcer of left heel, stage 3: Secondary | ICD-10-CM | POA: Diagnosis not present

## 2017-11-27 DIAGNOSIS — I120 Hypertensive chronic kidney disease with stage 5 chronic kidney disease or end stage renal disease: Secondary | ICD-10-CM | POA: Diagnosis not present

## 2017-11-27 DIAGNOSIS — E1122 Type 2 diabetes mellitus with diabetic chronic kidney disease: Secondary | ICD-10-CM | POA: Diagnosis not present

## 2017-11-27 DIAGNOSIS — E1151 Type 2 diabetes mellitus with diabetic peripheral angiopathy without gangrene: Secondary | ICD-10-CM | POA: Diagnosis not present

## 2017-11-27 DIAGNOSIS — N185 Chronic kidney disease, stage 5: Secondary | ICD-10-CM | POA: Diagnosis not present

## 2017-11-27 IMAGING — MR MR HEAD W/O CM
8 of 10 series · 36 of 48 positions shown · non-contrast
Comparison: CT head 12/06/2014

CLINICAL DATA: Dizziness.  Multiple falls.  Syncope.

EXAM:
MRI HEAD WITHOUT CONTRAST
TECHNIQUE: Multiplanar, multiecho pulse sequences of the brain and surrounding
structures were obtained without intravenous contrast.

[Series 2: [id number]_sag · sagittal · 5.0mm · 0.44mm/px · 2 of 20 slices shown]
[im 1/20]
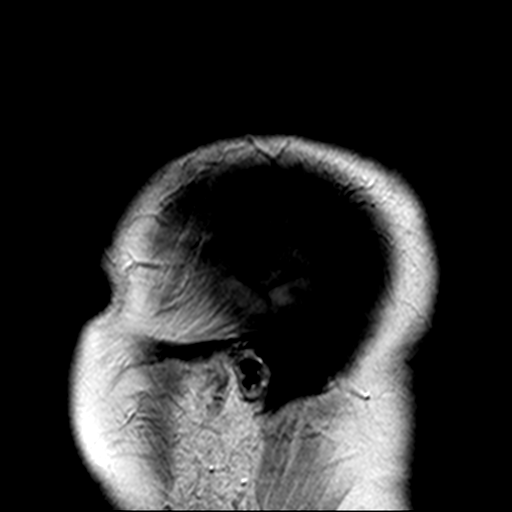
[im 20/20]
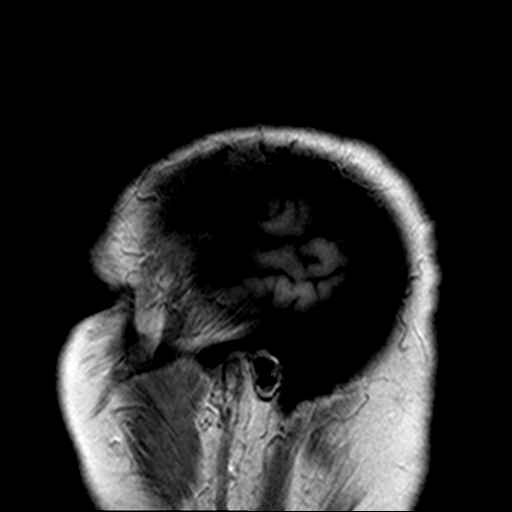

[Series 5: T2 · axial · 5.0mm · 0.75mm/px · z∈[-67,+75]mm · 3 of 23 slices shown (1 of 2)]
[im 1/23]
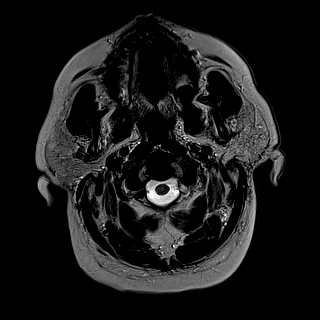
[im 12/23]
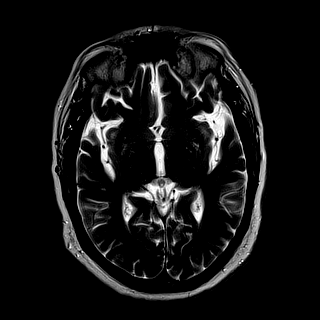
[im 23/23]
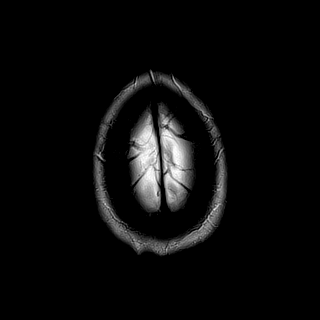

[Series 6: FLAIR · axial · 5.0mm · 0.94mm/px · z∈[-67,+75]mm · 3 of 23 slices shown]
[im 1/23]
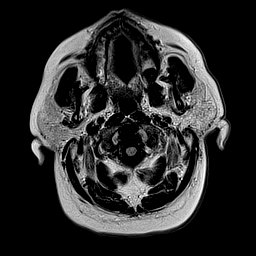
[im 12/23]
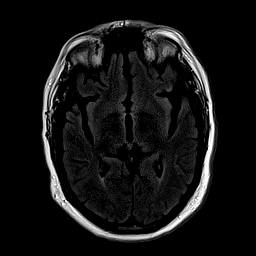
[im 23/23]
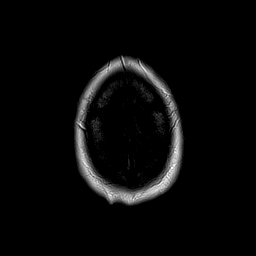

[Series 7: T1 · axial · 2.0mm · 0.47mm/px · z∈[-81,+105]mm · 11 of 95 slices shown]
[im 1/95]
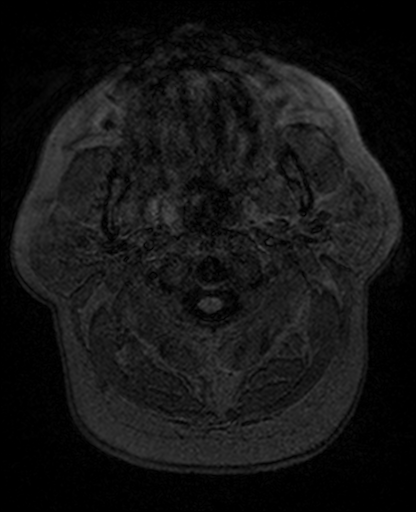
[im 10/95]
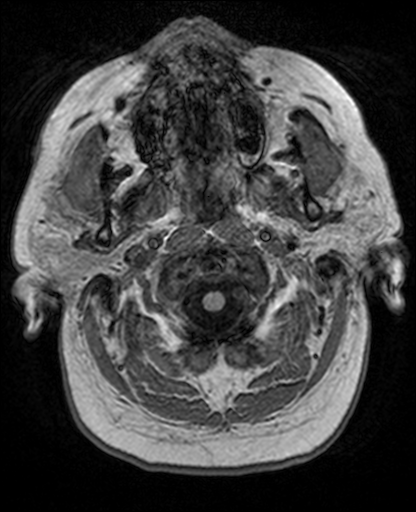
[im 19/95]
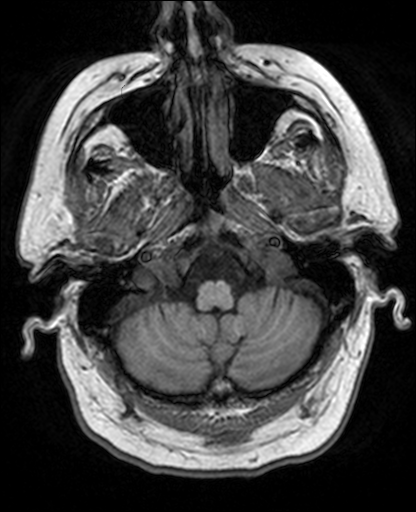
[im 29/95]
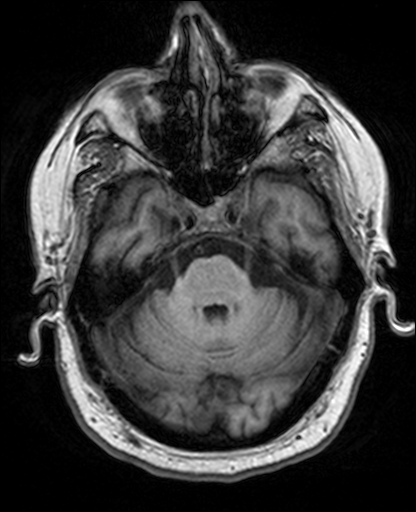
[im 38/95]
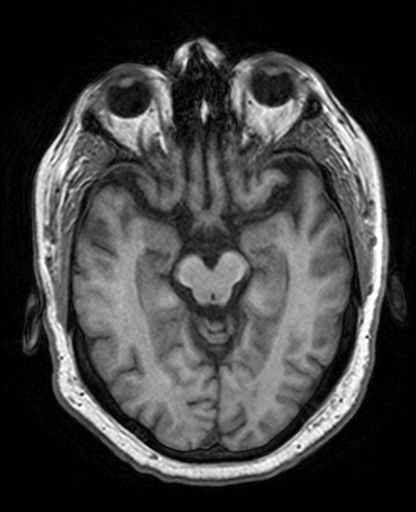
[im 48/95]
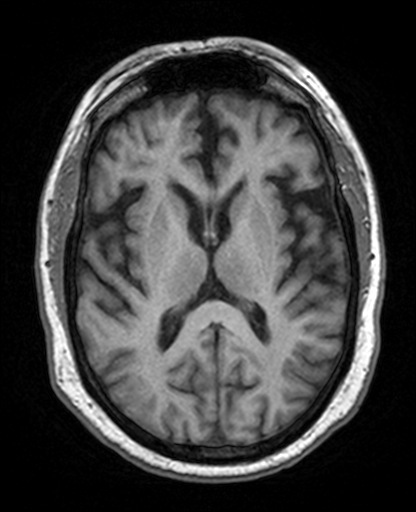
[im 57/95]
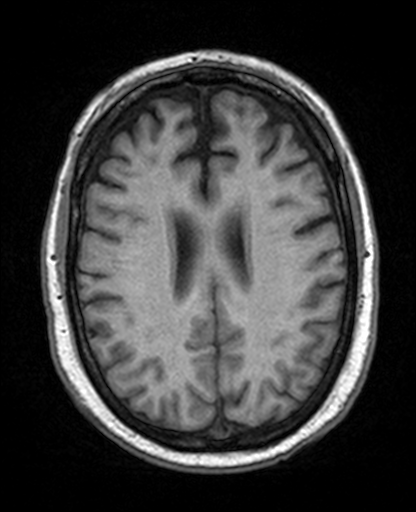
[im 66/95]
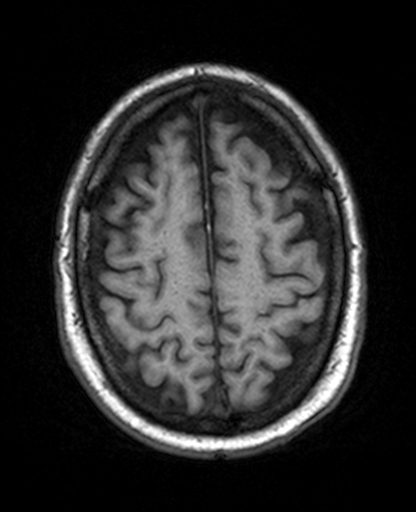
[im 76/95]
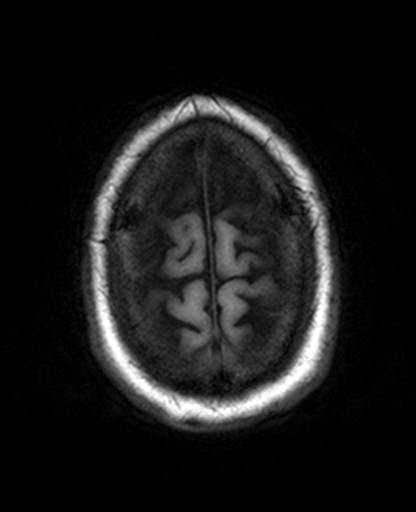
[im 85/95]
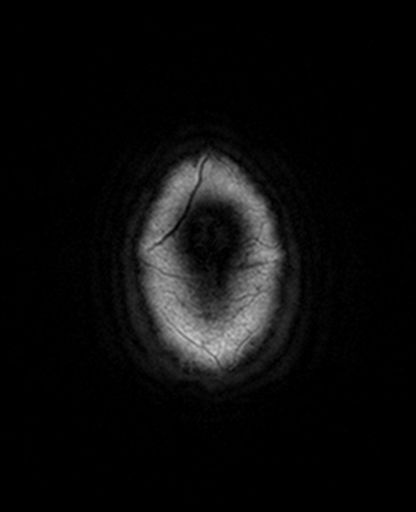
[im 95/95]
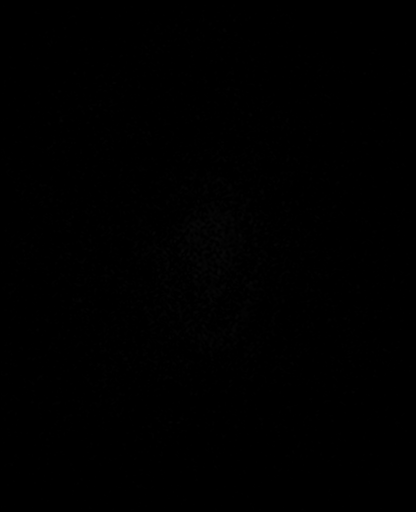

[Series 8: trauma axial · axial · 5.0mm · 0.45mm/px · z∈[-62,+67]mm · 2 of 21 slices shown]
[im 1/21]
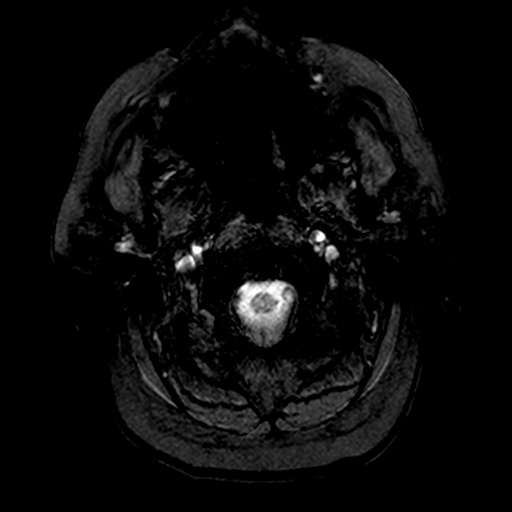
[im 21/21]
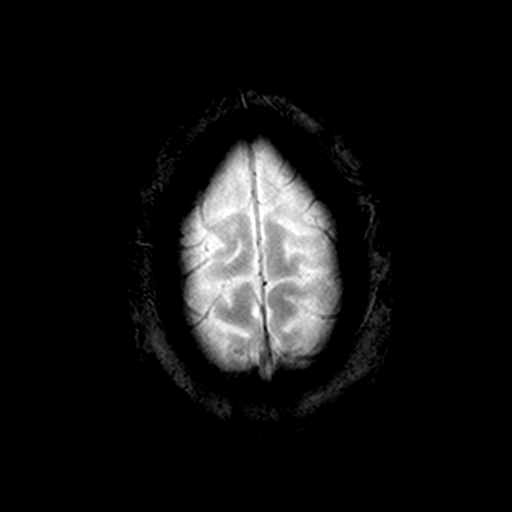

[Series 9: T2 · coronal · 5.0mm · 0.64mm/px · 3 of 28 slices shown (2 of 2)]
[im 1/28]
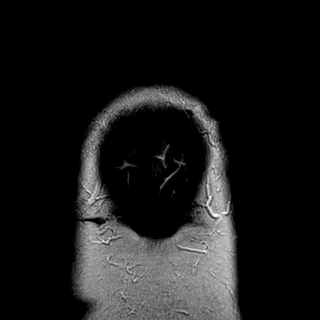
[im 14/28]
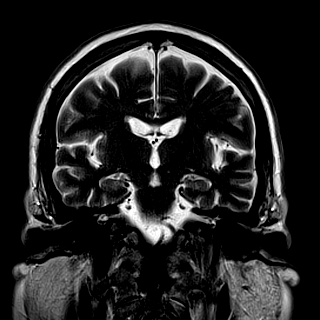
[im 28/28]
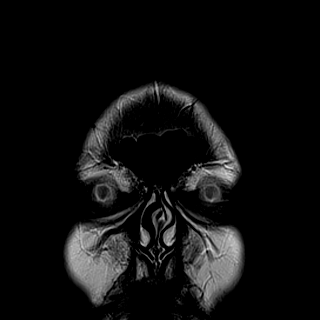

[Series 100: <mpr thick range> · axial · 3.0mm · 0.82mm/px · z∈[-60,+71]mm · 5 of 45 slices shown]
[im 1/45]
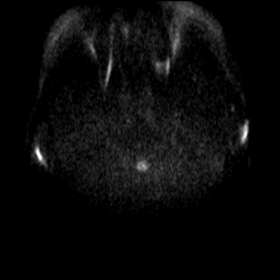
[im 12/45]
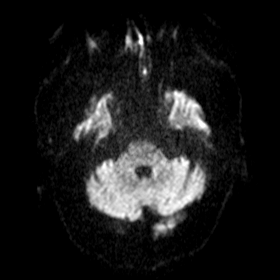
[im 23/45]
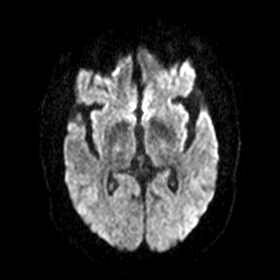
[im 34/45]
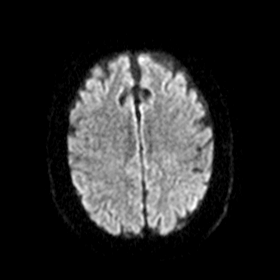
[im 45/45]
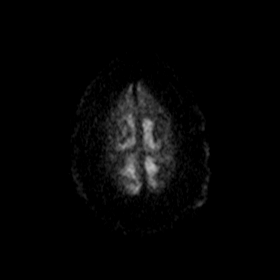

[Series 101: <mpr thick range(1)> · coronal · 3.0mm · 0.82mm/px · 7 of 58 slices shown]
[im 1/58]
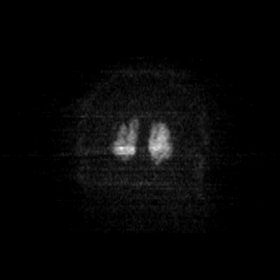
[im 10/58]
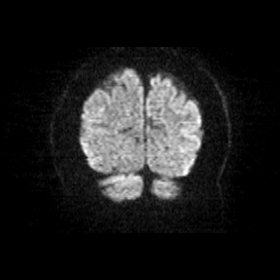
[im 20/58]
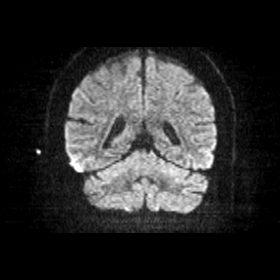
[im 29/58]
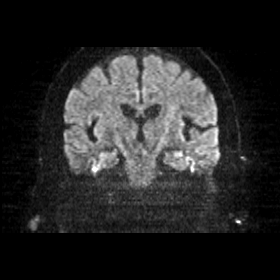
[im 39/58]
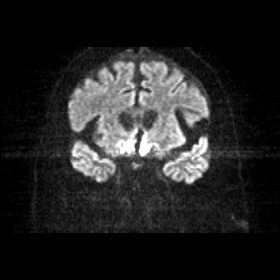
[im 48/58]
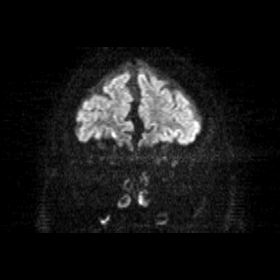
[im 58/58]
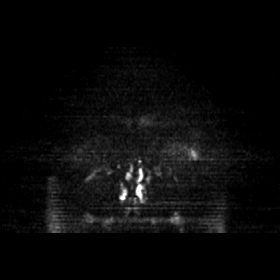

[36 of 48 positions shown; findings below may reference images not displayed]

FINDINGS: Mild atrophy.  Negative for hydrocephalus.

Negative for acute infarct. Mild periventricular white matter
hyperintensity without significant focal infarct. Brainstem and
cerebellum normal. Basal ganglia normal.

Negative for hemorrhage or fluid collection.

Negative for mass or edema.

Paranasal sinuses clear.  Normal orbit.  Pituitary normal in size.
IMPRESSION: Mild atrophy.  No acute abnormality

## 2017-11-28 DIAGNOSIS — Z992 Dependence on renal dialysis: Secondary | ICD-10-CM | POA: Diagnosis not present

## 2017-11-28 DIAGNOSIS — N186 End stage renal disease: Secondary | ICD-10-CM | POA: Diagnosis not present

## 2017-11-28 DIAGNOSIS — D509 Iron deficiency anemia, unspecified: Secondary | ICD-10-CM | POA: Diagnosis not present

## 2017-11-28 DIAGNOSIS — N2581 Secondary hyperparathyroidism of renal origin: Secondary | ICD-10-CM | POA: Diagnosis not present

## 2017-11-28 DIAGNOSIS — D631 Anemia in chronic kidney disease: Secondary | ICD-10-CM | POA: Diagnosis not present

## 2017-11-30 DIAGNOSIS — D509 Iron deficiency anemia, unspecified: Secondary | ICD-10-CM | POA: Diagnosis not present

## 2017-11-30 DIAGNOSIS — Z992 Dependence on renal dialysis: Secondary | ICD-10-CM | POA: Diagnosis not present

## 2017-11-30 DIAGNOSIS — N186 End stage renal disease: Secondary | ICD-10-CM | POA: Diagnosis not present

## 2017-11-30 DIAGNOSIS — D631 Anemia in chronic kidney disease: Secondary | ICD-10-CM | POA: Diagnosis not present

## 2017-11-30 DIAGNOSIS — N2581 Secondary hyperparathyroidism of renal origin: Secondary | ICD-10-CM | POA: Diagnosis not present

## 2017-12-02 DIAGNOSIS — L89623 Pressure ulcer of left heel, stage 3: Secondary | ICD-10-CM | POA: Diagnosis not present

## 2017-12-02 DIAGNOSIS — N185 Chronic kidney disease, stage 5: Secondary | ICD-10-CM | POA: Diagnosis not present

## 2017-12-02 DIAGNOSIS — E1151 Type 2 diabetes mellitus with diabetic peripheral angiopathy without gangrene: Secondary | ICD-10-CM | POA: Diagnosis not present

## 2017-12-02 DIAGNOSIS — E1122 Type 2 diabetes mellitus with diabetic chronic kidney disease: Secondary | ICD-10-CM | POA: Diagnosis not present

## 2017-12-02 DIAGNOSIS — I120 Hypertensive chronic kidney disease with stage 5 chronic kidney disease or end stage renal disease: Secondary | ICD-10-CM | POA: Diagnosis not present

## 2017-12-02 DIAGNOSIS — E1142 Type 2 diabetes mellitus with diabetic polyneuropathy: Secondary | ICD-10-CM | POA: Diagnosis not present

## 2017-12-03 DIAGNOSIS — D631 Anemia in chronic kidney disease: Secondary | ICD-10-CM | POA: Diagnosis not present

## 2017-12-03 DIAGNOSIS — N2581 Secondary hyperparathyroidism of renal origin: Secondary | ICD-10-CM | POA: Diagnosis not present

## 2017-12-03 DIAGNOSIS — D509 Iron deficiency anemia, unspecified: Secondary | ICD-10-CM | POA: Diagnosis not present

## 2017-12-03 DIAGNOSIS — Z992 Dependence on renal dialysis: Secondary | ICD-10-CM | POA: Diagnosis not present

## 2017-12-03 DIAGNOSIS — N186 End stage renal disease: Secondary | ICD-10-CM | POA: Diagnosis not present

## 2017-12-05 DIAGNOSIS — Z992 Dependence on renal dialysis: Secondary | ICD-10-CM | POA: Diagnosis not present

## 2017-12-05 DIAGNOSIS — N2581 Secondary hyperparathyroidism of renal origin: Secondary | ICD-10-CM | POA: Diagnosis not present

## 2017-12-05 DIAGNOSIS — D509 Iron deficiency anemia, unspecified: Secondary | ICD-10-CM | POA: Diagnosis not present

## 2017-12-05 DIAGNOSIS — D631 Anemia in chronic kidney disease: Secondary | ICD-10-CM | POA: Diagnosis not present

## 2017-12-05 DIAGNOSIS — N186 End stage renal disease: Secondary | ICD-10-CM | POA: Diagnosis not present

## 2017-12-07 DIAGNOSIS — N2581 Secondary hyperparathyroidism of renal origin: Secondary | ICD-10-CM | POA: Diagnosis not present

## 2017-12-07 DIAGNOSIS — Z992 Dependence on renal dialysis: Secondary | ICD-10-CM | POA: Diagnosis not present

## 2017-12-07 DIAGNOSIS — N186 End stage renal disease: Secondary | ICD-10-CM | POA: Diagnosis not present

## 2017-12-07 DIAGNOSIS — D509 Iron deficiency anemia, unspecified: Secondary | ICD-10-CM | POA: Diagnosis not present

## 2017-12-07 DIAGNOSIS — D631 Anemia in chronic kidney disease: Secondary | ICD-10-CM | POA: Diagnosis not present

## 2017-12-10 DIAGNOSIS — Z992 Dependence on renal dialysis: Secondary | ICD-10-CM | POA: Diagnosis not present

## 2017-12-10 DIAGNOSIS — N186 End stage renal disease: Secondary | ICD-10-CM | POA: Diagnosis not present

## 2017-12-10 DIAGNOSIS — D631 Anemia in chronic kidney disease: Secondary | ICD-10-CM | POA: Diagnosis not present

## 2017-12-10 DIAGNOSIS — N2581 Secondary hyperparathyroidism of renal origin: Secondary | ICD-10-CM | POA: Diagnosis not present

## 2017-12-10 DIAGNOSIS — D509 Iron deficiency anemia, unspecified: Secondary | ICD-10-CM | POA: Diagnosis not present

## 2017-12-11 DIAGNOSIS — N185 Chronic kidney disease, stage 5: Secondary | ICD-10-CM | POA: Diagnosis not present

## 2017-12-11 DIAGNOSIS — D509 Iron deficiency anemia, unspecified: Secondary | ICD-10-CM | POA: Diagnosis not present

## 2017-12-11 DIAGNOSIS — N2581 Secondary hyperparathyroidism of renal origin: Secondary | ICD-10-CM | POA: Diagnosis not present

## 2017-12-11 DIAGNOSIS — Z992 Dependence on renal dialysis: Secondary | ICD-10-CM | POA: Diagnosis not present

## 2017-12-11 DIAGNOSIS — D631 Anemia in chronic kidney disease: Secondary | ICD-10-CM | POA: Diagnosis not present

## 2017-12-11 DIAGNOSIS — E1122 Type 2 diabetes mellitus with diabetic chronic kidney disease: Secondary | ICD-10-CM | POA: Diagnosis not present

## 2017-12-11 DIAGNOSIS — E1151 Type 2 diabetes mellitus with diabetic peripheral angiopathy without gangrene: Secondary | ICD-10-CM | POA: Diagnosis not present

## 2017-12-11 DIAGNOSIS — I120 Hypertensive chronic kidney disease with stage 5 chronic kidney disease or end stage renal disease: Secondary | ICD-10-CM | POA: Diagnosis not present

## 2017-12-11 DIAGNOSIS — L89623 Pressure ulcer of left heel, stage 3: Secondary | ICD-10-CM | POA: Diagnosis not present

## 2017-12-11 DIAGNOSIS — E1142 Type 2 diabetes mellitus with diabetic polyneuropathy: Secondary | ICD-10-CM | POA: Diagnosis not present

## 2017-12-11 DIAGNOSIS — N186 End stage renal disease: Secondary | ICD-10-CM | POA: Diagnosis not present

## 2017-12-12 DIAGNOSIS — N186 End stage renal disease: Secondary | ICD-10-CM | POA: Diagnosis not present

## 2017-12-12 DIAGNOSIS — Z992 Dependence on renal dialysis: Secondary | ICD-10-CM | POA: Diagnosis not present

## 2017-12-12 DIAGNOSIS — D509 Iron deficiency anemia, unspecified: Secondary | ICD-10-CM | POA: Diagnosis not present

## 2017-12-12 DIAGNOSIS — D631 Anemia in chronic kidney disease: Secondary | ICD-10-CM | POA: Diagnosis not present

## 2017-12-12 DIAGNOSIS — N2581 Secondary hyperparathyroidism of renal origin: Secondary | ICD-10-CM | POA: Diagnosis not present

## 2017-12-14 DIAGNOSIS — D509 Iron deficiency anemia, unspecified: Secondary | ICD-10-CM | POA: Diagnosis not present

## 2017-12-14 DIAGNOSIS — N186 End stage renal disease: Secondary | ICD-10-CM | POA: Diagnosis not present

## 2017-12-14 DIAGNOSIS — Z992 Dependence on renal dialysis: Secondary | ICD-10-CM | POA: Diagnosis not present

## 2017-12-14 DIAGNOSIS — N2581 Secondary hyperparathyroidism of renal origin: Secondary | ICD-10-CM | POA: Diagnosis not present

## 2017-12-14 DIAGNOSIS — D631 Anemia in chronic kidney disease: Secondary | ICD-10-CM | POA: Diagnosis not present

## 2017-12-16 DIAGNOSIS — E1122 Type 2 diabetes mellitus with diabetic chronic kidney disease: Secondary | ICD-10-CM | POA: Diagnosis not present

## 2017-12-16 DIAGNOSIS — N185 Chronic kidney disease, stage 5: Secondary | ICD-10-CM | POA: Diagnosis not present

## 2017-12-16 DIAGNOSIS — I120 Hypertensive chronic kidney disease with stage 5 chronic kidney disease or end stage renal disease: Secondary | ICD-10-CM | POA: Diagnosis not present

## 2017-12-16 DIAGNOSIS — L89623 Pressure ulcer of left heel, stage 3: Secondary | ICD-10-CM | POA: Diagnosis not present

## 2017-12-16 DIAGNOSIS — E1151 Type 2 diabetes mellitus with diabetic peripheral angiopathy without gangrene: Secondary | ICD-10-CM | POA: Diagnosis not present

## 2017-12-16 DIAGNOSIS — E1142 Type 2 diabetes mellitus with diabetic polyneuropathy: Secondary | ICD-10-CM | POA: Diagnosis not present

## 2017-12-17 DIAGNOSIS — E1142 Type 2 diabetes mellitus with diabetic polyneuropathy: Secondary | ICD-10-CM | POA: Diagnosis not present

## 2017-12-17 DIAGNOSIS — E1151 Type 2 diabetes mellitus with diabetic peripheral angiopathy without gangrene: Secondary | ICD-10-CM | POA: Diagnosis not present

## 2017-12-17 DIAGNOSIS — Z992 Dependence on renal dialysis: Secondary | ICD-10-CM | POA: Diagnosis not present

## 2017-12-17 DIAGNOSIS — N186 End stage renal disease: Secondary | ICD-10-CM | POA: Diagnosis not present

## 2017-12-17 DIAGNOSIS — N2581 Secondary hyperparathyroidism of renal origin: Secondary | ICD-10-CM | POA: Diagnosis not present

## 2017-12-17 DIAGNOSIS — I120 Hypertensive chronic kidney disease with stage 5 chronic kidney disease or end stage renal disease: Secondary | ICD-10-CM | POA: Diagnosis not present

## 2017-12-17 DIAGNOSIS — E1122 Type 2 diabetes mellitus with diabetic chronic kidney disease: Secondary | ICD-10-CM | POA: Diagnosis not present

## 2017-12-17 DIAGNOSIS — D631 Anemia in chronic kidney disease: Secondary | ICD-10-CM | POA: Diagnosis not present

## 2017-12-17 DIAGNOSIS — D509 Iron deficiency anemia, unspecified: Secondary | ICD-10-CM | POA: Diagnosis not present

## 2017-12-19 DIAGNOSIS — Z992 Dependence on renal dialysis: Secondary | ICD-10-CM | POA: Diagnosis not present

## 2017-12-19 DIAGNOSIS — N186 End stage renal disease: Secondary | ICD-10-CM | POA: Diagnosis not present

## 2017-12-19 DIAGNOSIS — N2581 Secondary hyperparathyroidism of renal origin: Secondary | ICD-10-CM | POA: Diagnosis not present

## 2017-12-19 DIAGNOSIS — D509 Iron deficiency anemia, unspecified: Secondary | ICD-10-CM | POA: Diagnosis not present

## 2017-12-19 DIAGNOSIS — D631 Anemia in chronic kidney disease: Secondary | ICD-10-CM | POA: Diagnosis not present

## 2017-12-21 DIAGNOSIS — Z992 Dependence on renal dialysis: Secondary | ICD-10-CM | POA: Diagnosis not present

## 2017-12-21 DIAGNOSIS — N2581 Secondary hyperparathyroidism of renal origin: Secondary | ICD-10-CM | POA: Diagnosis not present

## 2017-12-21 DIAGNOSIS — N186 End stage renal disease: Secondary | ICD-10-CM | POA: Diagnosis not present

## 2017-12-21 DIAGNOSIS — D631 Anemia in chronic kidney disease: Secondary | ICD-10-CM | POA: Diagnosis not present

## 2017-12-21 DIAGNOSIS — D509 Iron deficiency anemia, unspecified: Secondary | ICD-10-CM | POA: Diagnosis not present

## 2017-12-24 DIAGNOSIS — D509 Iron deficiency anemia, unspecified: Secondary | ICD-10-CM | POA: Diagnosis not present

## 2017-12-24 DIAGNOSIS — D631 Anemia in chronic kidney disease: Secondary | ICD-10-CM | POA: Diagnosis not present

## 2017-12-24 DIAGNOSIS — Z992 Dependence on renal dialysis: Secondary | ICD-10-CM | POA: Diagnosis not present

## 2017-12-24 DIAGNOSIS — N2581 Secondary hyperparathyroidism of renal origin: Secondary | ICD-10-CM | POA: Diagnosis not present

## 2017-12-24 DIAGNOSIS — N186 End stage renal disease: Secondary | ICD-10-CM | POA: Diagnosis not present

## 2017-12-25 DIAGNOSIS — E1122 Type 2 diabetes mellitus with diabetic chronic kidney disease: Secondary | ICD-10-CM | POA: Diagnosis not present

## 2017-12-25 DIAGNOSIS — E1142 Type 2 diabetes mellitus with diabetic polyneuropathy: Secondary | ICD-10-CM | POA: Diagnosis not present

## 2017-12-25 DIAGNOSIS — E1151 Type 2 diabetes mellitus with diabetic peripheral angiopathy without gangrene: Secondary | ICD-10-CM | POA: Diagnosis not present

## 2017-12-25 DIAGNOSIS — N185 Chronic kidney disease, stage 5: Secondary | ICD-10-CM | POA: Diagnosis not present

## 2017-12-25 DIAGNOSIS — I120 Hypertensive chronic kidney disease with stage 5 chronic kidney disease or end stage renal disease: Secondary | ICD-10-CM | POA: Diagnosis not present

## 2017-12-25 DIAGNOSIS — L89623 Pressure ulcer of left heel, stage 3: Secondary | ICD-10-CM | POA: Diagnosis not present

## 2017-12-26 DIAGNOSIS — Z992 Dependence on renal dialysis: Secondary | ICD-10-CM | POA: Diagnosis not present

## 2017-12-26 DIAGNOSIS — N186 End stage renal disease: Secondary | ICD-10-CM | POA: Diagnosis not present

## 2017-12-26 DIAGNOSIS — N2581 Secondary hyperparathyroidism of renal origin: Secondary | ICD-10-CM | POA: Diagnosis not present

## 2017-12-26 DIAGNOSIS — D631 Anemia in chronic kidney disease: Secondary | ICD-10-CM | POA: Diagnosis not present

## 2017-12-26 DIAGNOSIS — D509 Iron deficiency anemia, unspecified: Secondary | ICD-10-CM | POA: Diagnosis not present

## 2017-12-28 DIAGNOSIS — N2581 Secondary hyperparathyroidism of renal origin: Secondary | ICD-10-CM | POA: Diagnosis not present

## 2017-12-28 DIAGNOSIS — N186 End stage renal disease: Secondary | ICD-10-CM | POA: Diagnosis not present

## 2017-12-28 DIAGNOSIS — D509 Iron deficiency anemia, unspecified: Secondary | ICD-10-CM | POA: Diagnosis not present

## 2017-12-28 DIAGNOSIS — Z992 Dependence on renal dialysis: Secondary | ICD-10-CM | POA: Diagnosis not present

## 2017-12-28 DIAGNOSIS — D631 Anemia in chronic kidney disease: Secondary | ICD-10-CM | POA: Diagnosis not present

## 2017-12-31 DIAGNOSIS — N2581 Secondary hyperparathyroidism of renal origin: Secondary | ICD-10-CM | POA: Diagnosis not present

## 2017-12-31 DIAGNOSIS — D631 Anemia in chronic kidney disease: Secondary | ICD-10-CM | POA: Diagnosis not present

## 2017-12-31 DIAGNOSIS — N186 End stage renal disease: Secondary | ICD-10-CM | POA: Diagnosis not present

## 2017-12-31 DIAGNOSIS — D509 Iron deficiency anemia, unspecified: Secondary | ICD-10-CM | POA: Diagnosis not present

## 2017-12-31 DIAGNOSIS — Z992 Dependence on renal dialysis: Secondary | ICD-10-CM | POA: Diagnosis not present

## 2018-01-02 DIAGNOSIS — D509 Iron deficiency anemia, unspecified: Secondary | ICD-10-CM | POA: Diagnosis not present

## 2018-01-02 DIAGNOSIS — N2581 Secondary hyperparathyroidism of renal origin: Secondary | ICD-10-CM | POA: Diagnosis not present

## 2018-01-02 DIAGNOSIS — N186 End stage renal disease: Secondary | ICD-10-CM | POA: Diagnosis not present

## 2018-01-02 DIAGNOSIS — Z992 Dependence on renal dialysis: Secondary | ICD-10-CM | POA: Diagnosis not present

## 2018-01-02 DIAGNOSIS — D631 Anemia in chronic kidney disease: Secondary | ICD-10-CM | POA: Diagnosis not present

## 2018-01-04 DIAGNOSIS — Z992 Dependence on renal dialysis: Secondary | ICD-10-CM | POA: Diagnosis not present

## 2018-01-04 DIAGNOSIS — D631 Anemia in chronic kidney disease: Secondary | ICD-10-CM | POA: Diagnosis not present

## 2018-01-04 DIAGNOSIS — N186 End stage renal disease: Secondary | ICD-10-CM | POA: Diagnosis not present

## 2018-01-04 DIAGNOSIS — N2581 Secondary hyperparathyroidism of renal origin: Secondary | ICD-10-CM | POA: Diagnosis not present

## 2018-01-04 DIAGNOSIS — D509 Iron deficiency anemia, unspecified: Secondary | ICD-10-CM | POA: Diagnosis not present

## 2018-01-07 DIAGNOSIS — N186 End stage renal disease: Secondary | ICD-10-CM | POA: Diagnosis not present

## 2018-01-07 DIAGNOSIS — D631 Anemia in chronic kidney disease: Secondary | ICD-10-CM | POA: Diagnosis not present

## 2018-01-07 DIAGNOSIS — N2581 Secondary hyperparathyroidism of renal origin: Secondary | ICD-10-CM | POA: Diagnosis not present

## 2018-01-07 DIAGNOSIS — D509 Iron deficiency anemia, unspecified: Secondary | ICD-10-CM | POA: Diagnosis not present

## 2018-01-07 DIAGNOSIS — Z992 Dependence on renal dialysis: Secondary | ICD-10-CM | POA: Diagnosis not present

## 2018-01-09 DIAGNOSIS — D631 Anemia in chronic kidney disease: Secondary | ICD-10-CM | POA: Diagnosis not present

## 2018-01-09 DIAGNOSIS — D509 Iron deficiency anemia, unspecified: Secondary | ICD-10-CM | POA: Diagnosis not present

## 2018-01-09 DIAGNOSIS — N186 End stage renal disease: Secondary | ICD-10-CM | POA: Diagnosis not present

## 2018-01-09 DIAGNOSIS — Z992 Dependence on renal dialysis: Secondary | ICD-10-CM | POA: Diagnosis not present

## 2018-01-09 DIAGNOSIS — N2581 Secondary hyperparathyroidism of renal origin: Secondary | ICD-10-CM | POA: Diagnosis not present

## 2018-01-10 DIAGNOSIS — Z992 Dependence on renal dialysis: Secondary | ICD-10-CM | POA: Diagnosis not present

## 2018-01-10 DIAGNOSIS — N186 End stage renal disease: Secondary | ICD-10-CM | POA: Diagnosis not present

## 2018-01-11 DIAGNOSIS — Z992 Dependence on renal dialysis: Secondary | ICD-10-CM | POA: Diagnosis not present

## 2018-01-11 DIAGNOSIS — N186 End stage renal disease: Secondary | ICD-10-CM | POA: Diagnosis not present

## 2018-01-11 DIAGNOSIS — D631 Anemia in chronic kidney disease: Secondary | ICD-10-CM | POA: Diagnosis not present

## 2018-01-11 DIAGNOSIS — D509 Iron deficiency anemia, unspecified: Secondary | ICD-10-CM | POA: Diagnosis not present

## 2018-01-11 DIAGNOSIS — N2581 Secondary hyperparathyroidism of renal origin: Secondary | ICD-10-CM | POA: Diagnosis not present

## 2018-01-14 DIAGNOSIS — N2581 Secondary hyperparathyroidism of renal origin: Secondary | ICD-10-CM | POA: Diagnosis not present

## 2018-01-14 DIAGNOSIS — Z992 Dependence on renal dialysis: Secondary | ICD-10-CM | POA: Diagnosis not present

## 2018-01-14 DIAGNOSIS — D509 Iron deficiency anemia, unspecified: Secondary | ICD-10-CM | POA: Diagnosis not present

## 2018-01-14 DIAGNOSIS — D631 Anemia in chronic kidney disease: Secondary | ICD-10-CM | POA: Diagnosis not present

## 2018-01-14 DIAGNOSIS — N186 End stage renal disease: Secondary | ICD-10-CM | POA: Diagnosis not present

## 2018-01-16 DIAGNOSIS — Z992 Dependence on renal dialysis: Secondary | ICD-10-CM | POA: Diagnosis not present

## 2018-01-16 DIAGNOSIS — N186 End stage renal disease: Secondary | ICD-10-CM | POA: Diagnosis not present

## 2018-01-16 DIAGNOSIS — N2581 Secondary hyperparathyroidism of renal origin: Secondary | ICD-10-CM | POA: Diagnosis not present

## 2018-01-16 DIAGNOSIS — D631 Anemia in chronic kidney disease: Secondary | ICD-10-CM | POA: Diagnosis not present

## 2018-01-16 DIAGNOSIS — D509 Iron deficiency anemia, unspecified: Secondary | ICD-10-CM | POA: Diagnosis not present

## 2018-01-18 DIAGNOSIS — Z992 Dependence on renal dialysis: Secondary | ICD-10-CM | POA: Diagnosis not present

## 2018-01-18 DIAGNOSIS — N2581 Secondary hyperparathyroidism of renal origin: Secondary | ICD-10-CM | POA: Diagnosis not present

## 2018-01-18 DIAGNOSIS — N186 End stage renal disease: Secondary | ICD-10-CM | POA: Diagnosis not present

## 2018-01-18 DIAGNOSIS — D631 Anemia in chronic kidney disease: Secondary | ICD-10-CM | POA: Diagnosis not present

## 2018-01-18 DIAGNOSIS — D509 Iron deficiency anemia, unspecified: Secondary | ICD-10-CM | POA: Diagnosis not present

## 2018-01-21 DIAGNOSIS — N2581 Secondary hyperparathyroidism of renal origin: Secondary | ICD-10-CM | POA: Diagnosis not present

## 2018-01-21 DIAGNOSIS — N186 End stage renal disease: Secondary | ICD-10-CM | POA: Diagnosis not present

## 2018-01-21 DIAGNOSIS — D509 Iron deficiency anemia, unspecified: Secondary | ICD-10-CM | POA: Diagnosis not present

## 2018-01-21 DIAGNOSIS — Z992 Dependence on renal dialysis: Secondary | ICD-10-CM | POA: Diagnosis not present

## 2018-01-21 DIAGNOSIS — D631 Anemia in chronic kidney disease: Secondary | ICD-10-CM | POA: Diagnosis not present

## 2018-01-23 DIAGNOSIS — D631 Anemia in chronic kidney disease: Secondary | ICD-10-CM | POA: Diagnosis not present

## 2018-01-23 DIAGNOSIS — N186 End stage renal disease: Secondary | ICD-10-CM | POA: Diagnosis not present

## 2018-01-23 DIAGNOSIS — Z992 Dependence on renal dialysis: Secondary | ICD-10-CM | POA: Diagnosis not present

## 2018-01-23 DIAGNOSIS — N2581 Secondary hyperparathyroidism of renal origin: Secondary | ICD-10-CM | POA: Diagnosis not present

## 2018-01-23 DIAGNOSIS — D509 Iron deficiency anemia, unspecified: Secondary | ICD-10-CM | POA: Diagnosis not present

## 2018-01-25 DIAGNOSIS — D631 Anemia in chronic kidney disease: Secondary | ICD-10-CM | POA: Diagnosis not present

## 2018-01-25 DIAGNOSIS — D509 Iron deficiency anemia, unspecified: Secondary | ICD-10-CM | POA: Diagnosis not present

## 2018-01-25 DIAGNOSIS — Z992 Dependence on renal dialysis: Secondary | ICD-10-CM | POA: Diagnosis not present

## 2018-01-25 DIAGNOSIS — N186 End stage renal disease: Secondary | ICD-10-CM | POA: Diagnosis not present

## 2018-01-25 DIAGNOSIS — N2581 Secondary hyperparathyroidism of renal origin: Secondary | ICD-10-CM | POA: Diagnosis not present

## 2018-01-28 DIAGNOSIS — D631 Anemia in chronic kidney disease: Secondary | ICD-10-CM | POA: Diagnosis not present

## 2018-01-28 DIAGNOSIS — N2581 Secondary hyperparathyroidism of renal origin: Secondary | ICD-10-CM | POA: Diagnosis not present

## 2018-01-28 DIAGNOSIS — N186 End stage renal disease: Secondary | ICD-10-CM | POA: Diagnosis not present

## 2018-01-28 DIAGNOSIS — Z992 Dependence on renal dialysis: Secondary | ICD-10-CM | POA: Diagnosis not present

## 2018-01-28 DIAGNOSIS — D509 Iron deficiency anemia, unspecified: Secondary | ICD-10-CM | POA: Diagnosis not present

## 2018-01-30 DIAGNOSIS — I499 Cardiac arrhythmia, unspecified: Secondary | ICD-10-CM | POA: Diagnosis not present

## 2018-01-30 DIAGNOSIS — N186 End stage renal disease: Secondary | ICD-10-CM | POA: Diagnosis not present

## 2018-01-30 DIAGNOSIS — D509 Iron deficiency anemia, unspecified: Secondary | ICD-10-CM | POA: Diagnosis not present

## 2018-01-30 DIAGNOSIS — N2581 Secondary hyperparathyroidism of renal origin: Secondary | ICD-10-CM | POA: Diagnosis not present

## 2018-01-30 DIAGNOSIS — Z992 Dependence on renal dialysis: Secondary | ICD-10-CM | POA: Diagnosis not present

## 2018-01-30 DIAGNOSIS — D631 Anemia in chronic kidney disease: Secondary | ICD-10-CM | POA: Diagnosis not present

## 2018-02-01 DIAGNOSIS — D509 Iron deficiency anemia, unspecified: Secondary | ICD-10-CM | POA: Diagnosis not present

## 2018-02-01 DIAGNOSIS — D631 Anemia in chronic kidney disease: Secondary | ICD-10-CM | POA: Diagnosis not present

## 2018-02-01 DIAGNOSIS — Z992 Dependence on renal dialysis: Secondary | ICD-10-CM | POA: Diagnosis not present

## 2018-02-01 DIAGNOSIS — N2581 Secondary hyperparathyroidism of renal origin: Secondary | ICD-10-CM | POA: Diagnosis not present

## 2018-02-01 DIAGNOSIS — N186 End stage renal disease: Secondary | ICD-10-CM | POA: Diagnosis not present

## 2018-02-04 DIAGNOSIS — Z794 Long term (current) use of insulin: Secondary | ICD-10-CM | POA: Diagnosis not present

## 2018-02-04 DIAGNOSIS — D509 Iron deficiency anemia, unspecified: Secondary | ICD-10-CM | POA: Diagnosis not present

## 2018-02-04 DIAGNOSIS — Z992 Dependence on renal dialysis: Secondary | ICD-10-CM | POA: Diagnosis not present

## 2018-02-04 DIAGNOSIS — D631 Anemia in chronic kidney disease: Secondary | ICD-10-CM | POA: Diagnosis not present

## 2018-02-04 DIAGNOSIS — E119 Type 2 diabetes mellitus without complications: Secondary | ICD-10-CM | POA: Diagnosis not present

## 2018-02-04 DIAGNOSIS — N186 End stage renal disease: Secondary | ICD-10-CM | POA: Diagnosis not present

## 2018-02-04 DIAGNOSIS — N2581 Secondary hyperparathyroidism of renal origin: Secondary | ICD-10-CM | POA: Diagnosis not present

## 2018-02-06 DIAGNOSIS — N2581 Secondary hyperparathyroidism of renal origin: Secondary | ICD-10-CM | POA: Diagnosis not present

## 2018-02-06 DIAGNOSIS — N186 End stage renal disease: Secondary | ICD-10-CM | POA: Diagnosis not present

## 2018-02-06 DIAGNOSIS — D631 Anemia in chronic kidney disease: Secondary | ICD-10-CM | POA: Diagnosis not present

## 2018-02-06 DIAGNOSIS — Z992 Dependence on renal dialysis: Secondary | ICD-10-CM | POA: Diagnosis not present

## 2018-02-06 DIAGNOSIS — D509 Iron deficiency anemia, unspecified: Secondary | ICD-10-CM | POA: Diagnosis not present

## 2018-02-08 DIAGNOSIS — N2581 Secondary hyperparathyroidism of renal origin: Secondary | ICD-10-CM | POA: Diagnosis not present

## 2018-02-08 DIAGNOSIS — D631 Anemia in chronic kidney disease: Secondary | ICD-10-CM | POA: Diagnosis not present

## 2018-02-08 DIAGNOSIS — N186 End stage renal disease: Secondary | ICD-10-CM | POA: Diagnosis not present

## 2018-02-08 DIAGNOSIS — Z992 Dependence on renal dialysis: Secondary | ICD-10-CM | POA: Diagnosis not present

## 2018-02-08 DIAGNOSIS — D509 Iron deficiency anemia, unspecified: Secondary | ICD-10-CM | POA: Diagnosis not present

## 2018-02-09 DIAGNOSIS — N186 End stage renal disease: Secondary | ICD-10-CM | POA: Diagnosis not present

## 2018-02-09 DIAGNOSIS — Z992 Dependence on renal dialysis: Secondary | ICD-10-CM | POA: Diagnosis not present

## 2018-02-11 DIAGNOSIS — N2581 Secondary hyperparathyroidism of renal origin: Secondary | ICD-10-CM | POA: Diagnosis not present

## 2018-02-11 DIAGNOSIS — N186 End stage renal disease: Secondary | ICD-10-CM | POA: Diagnosis not present

## 2018-02-11 DIAGNOSIS — Z992 Dependence on renal dialysis: Secondary | ICD-10-CM | POA: Diagnosis not present

## 2018-02-11 DIAGNOSIS — D631 Anemia in chronic kidney disease: Secondary | ICD-10-CM | POA: Diagnosis not present

## 2018-02-11 DIAGNOSIS — D509 Iron deficiency anemia, unspecified: Secondary | ICD-10-CM | POA: Diagnosis not present

## 2018-02-13 DIAGNOSIS — N2581 Secondary hyperparathyroidism of renal origin: Secondary | ICD-10-CM | POA: Diagnosis not present

## 2018-02-13 DIAGNOSIS — N186 End stage renal disease: Secondary | ICD-10-CM | POA: Diagnosis not present

## 2018-02-13 DIAGNOSIS — Z992 Dependence on renal dialysis: Secondary | ICD-10-CM | POA: Diagnosis not present

## 2018-02-13 DIAGNOSIS — D509 Iron deficiency anemia, unspecified: Secondary | ICD-10-CM | POA: Diagnosis not present

## 2018-02-13 DIAGNOSIS — D631 Anemia in chronic kidney disease: Secondary | ICD-10-CM | POA: Diagnosis not present

## 2018-02-15 DIAGNOSIS — Z992 Dependence on renal dialysis: Secondary | ICD-10-CM | POA: Diagnosis not present

## 2018-02-15 DIAGNOSIS — D631 Anemia in chronic kidney disease: Secondary | ICD-10-CM | POA: Diagnosis not present

## 2018-02-15 DIAGNOSIS — D509 Iron deficiency anemia, unspecified: Secondary | ICD-10-CM | POA: Diagnosis not present

## 2018-02-15 DIAGNOSIS — N186 End stage renal disease: Secondary | ICD-10-CM | POA: Diagnosis not present

## 2018-02-15 DIAGNOSIS — N2581 Secondary hyperparathyroidism of renal origin: Secondary | ICD-10-CM | POA: Diagnosis not present

## 2018-02-18 DIAGNOSIS — D631 Anemia in chronic kidney disease: Secondary | ICD-10-CM | POA: Diagnosis not present

## 2018-02-18 DIAGNOSIS — N2581 Secondary hyperparathyroidism of renal origin: Secondary | ICD-10-CM | POA: Diagnosis not present

## 2018-02-18 DIAGNOSIS — D509 Iron deficiency anemia, unspecified: Secondary | ICD-10-CM | POA: Diagnosis not present

## 2018-02-18 DIAGNOSIS — Z992 Dependence on renal dialysis: Secondary | ICD-10-CM | POA: Diagnosis not present

## 2018-02-18 DIAGNOSIS — N186 End stage renal disease: Secondary | ICD-10-CM | POA: Diagnosis not present

## 2018-02-19 DIAGNOSIS — E1143 Type 2 diabetes mellitus with diabetic autonomic (poly)neuropathy: Secondary | ICD-10-CM | POA: Diagnosis not present

## 2018-02-19 DIAGNOSIS — I10 Essential (primary) hypertension: Secondary | ICD-10-CM | POA: Diagnosis not present

## 2018-02-19 DIAGNOSIS — J440 Chronic obstructive pulmonary disease with acute lower respiratory infection: Secondary | ICD-10-CM | POA: Diagnosis not present

## 2018-02-19 DIAGNOSIS — E1122 Type 2 diabetes mellitus with diabetic chronic kidney disease: Secondary | ICD-10-CM | POA: Diagnosis not present

## 2018-02-19 DIAGNOSIS — K210 Gastro-esophageal reflux disease with esophagitis: Secondary | ICD-10-CM | POA: Diagnosis not present

## 2018-02-19 DIAGNOSIS — Z9189 Other specified personal risk factors, not elsewhere classified: Secondary | ICD-10-CM | POA: Diagnosis not present

## 2018-02-19 DIAGNOSIS — E039 Hypothyroidism, unspecified: Secondary | ICD-10-CM | POA: Diagnosis not present

## 2018-02-19 DIAGNOSIS — E782 Mixed hyperlipidemia: Secondary | ICD-10-CM | POA: Diagnosis not present

## 2018-02-19 DIAGNOSIS — N185 Chronic kidney disease, stage 5: Secondary | ICD-10-CM | POA: Diagnosis not present

## 2018-02-20 DIAGNOSIS — Z992 Dependence on renal dialysis: Secondary | ICD-10-CM | POA: Diagnosis not present

## 2018-02-20 DIAGNOSIS — N2581 Secondary hyperparathyroidism of renal origin: Secondary | ICD-10-CM | POA: Diagnosis not present

## 2018-02-20 DIAGNOSIS — N186 End stage renal disease: Secondary | ICD-10-CM | POA: Diagnosis not present

## 2018-02-20 DIAGNOSIS — D509 Iron deficiency anemia, unspecified: Secondary | ICD-10-CM | POA: Diagnosis not present

## 2018-02-20 DIAGNOSIS — D631 Anemia in chronic kidney disease: Secondary | ICD-10-CM | POA: Diagnosis not present

## 2018-02-21 DIAGNOSIS — E782 Mixed hyperlipidemia: Secondary | ICD-10-CM | POA: Diagnosis not present

## 2018-02-21 DIAGNOSIS — Z992 Dependence on renal dialysis: Secondary | ICD-10-CM | POA: Diagnosis not present

## 2018-02-21 DIAGNOSIS — I739 Peripheral vascular disease, unspecified: Secondary | ICD-10-CM | POA: Diagnosis not present

## 2018-02-21 DIAGNOSIS — E11621 Type 2 diabetes mellitus with foot ulcer: Secondary | ICD-10-CM | POA: Diagnosis not present

## 2018-02-21 DIAGNOSIS — Z6831 Body mass index (BMI) 31.0-31.9, adult: Secondary | ICD-10-CM | POA: Diagnosis not present

## 2018-02-21 DIAGNOSIS — I10 Essential (primary) hypertension: Secondary | ICD-10-CM | POA: Diagnosis not present

## 2018-02-21 DIAGNOSIS — E1122 Type 2 diabetes mellitus with diabetic chronic kidney disease: Secondary | ICD-10-CM | POA: Diagnosis not present

## 2018-02-21 DIAGNOSIS — E1142 Type 2 diabetes mellitus with diabetic polyneuropathy: Secondary | ICD-10-CM | POA: Diagnosis not present

## 2018-02-22 DIAGNOSIS — D631 Anemia in chronic kidney disease: Secondary | ICD-10-CM | POA: Diagnosis not present

## 2018-02-22 DIAGNOSIS — N186 End stage renal disease: Secondary | ICD-10-CM | POA: Diagnosis not present

## 2018-02-22 DIAGNOSIS — N2581 Secondary hyperparathyroidism of renal origin: Secondary | ICD-10-CM | POA: Diagnosis not present

## 2018-02-22 DIAGNOSIS — D509 Iron deficiency anemia, unspecified: Secondary | ICD-10-CM | POA: Diagnosis not present

## 2018-02-22 DIAGNOSIS — Z992 Dependence on renal dialysis: Secondary | ICD-10-CM | POA: Diagnosis not present

## 2018-02-25 DIAGNOSIS — Z992 Dependence on renal dialysis: Secondary | ICD-10-CM | POA: Diagnosis not present

## 2018-02-25 DIAGNOSIS — D509 Iron deficiency anemia, unspecified: Secondary | ICD-10-CM | POA: Diagnosis not present

## 2018-02-25 DIAGNOSIS — D631 Anemia in chronic kidney disease: Secondary | ICD-10-CM | POA: Diagnosis not present

## 2018-02-25 DIAGNOSIS — N2581 Secondary hyperparathyroidism of renal origin: Secondary | ICD-10-CM | POA: Diagnosis not present

## 2018-02-25 DIAGNOSIS — N186 End stage renal disease: Secondary | ICD-10-CM | POA: Diagnosis not present

## 2018-02-27 DIAGNOSIS — D509 Iron deficiency anemia, unspecified: Secondary | ICD-10-CM | POA: Diagnosis not present

## 2018-02-27 DIAGNOSIS — N186 End stage renal disease: Secondary | ICD-10-CM | POA: Diagnosis not present

## 2018-02-27 DIAGNOSIS — Z992 Dependence on renal dialysis: Secondary | ICD-10-CM | POA: Diagnosis not present

## 2018-02-27 DIAGNOSIS — N2581 Secondary hyperparathyroidism of renal origin: Secondary | ICD-10-CM | POA: Diagnosis not present

## 2018-02-27 DIAGNOSIS — D631 Anemia in chronic kidney disease: Secondary | ICD-10-CM | POA: Diagnosis not present

## 2018-03-01 DIAGNOSIS — N2581 Secondary hyperparathyroidism of renal origin: Secondary | ICD-10-CM | POA: Diagnosis not present

## 2018-03-01 DIAGNOSIS — Z992 Dependence on renal dialysis: Secondary | ICD-10-CM | POA: Diagnosis not present

## 2018-03-01 DIAGNOSIS — D631 Anemia in chronic kidney disease: Secondary | ICD-10-CM | POA: Diagnosis not present

## 2018-03-01 DIAGNOSIS — N186 End stage renal disease: Secondary | ICD-10-CM | POA: Diagnosis not present

## 2018-03-01 DIAGNOSIS — D509 Iron deficiency anemia, unspecified: Secondary | ICD-10-CM | POA: Diagnosis not present

## 2018-03-04 DIAGNOSIS — N186 End stage renal disease: Secondary | ICD-10-CM | POA: Diagnosis not present

## 2018-03-04 DIAGNOSIS — Z992 Dependence on renal dialysis: Secondary | ICD-10-CM | POA: Diagnosis not present

## 2018-03-04 DIAGNOSIS — D509 Iron deficiency anemia, unspecified: Secondary | ICD-10-CM | POA: Diagnosis not present

## 2018-03-04 DIAGNOSIS — D631 Anemia in chronic kidney disease: Secondary | ICD-10-CM | POA: Diagnosis not present

## 2018-03-04 DIAGNOSIS — N2581 Secondary hyperparathyroidism of renal origin: Secondary | ICD-10-CM | POA: Diagnosis not present

## 2018-03-06 DIAGNOSIS — N186 End stage renal disease: Secondary | ICD-10-CM | POA: Diagnosis not present

## 2018-03-06 DIAGNOSIS — D631 Anemia in chronic kidney disease: Secondary | ICD-10-CM | POA: Diagnosis not present

## 2018-03-06 DIAGNOSIS — Z992 Dependence on renal dialysis: Secondary | ICD-10-CM | POA: Diagnosis not present

## 2018-03-06 DIAGNOSIS — D509 Iron deficiency anemia, unspecified: Secondary | ICD-10-CM | POA: Diagnosis not present

## 2018-03-06 DIAGNOSIS — N2581 Secondary hyperparathyroidism of renal origin: Secondary | ICD-10-CM | POA: Diagnosis not present

## 2018-03-08 DIAGNOSIS — N186 End stage renal disease: Secondary | ICD-10-CM | POA: Diagnosis not present

## 2018-03-08 DIAGNOSIS — N2581 Secondary hyperparathyroidism of renal origin: Secondary | ICD-10-CM | POA: Diagnosis not present

## 2018-03-08 DIAGNOSIS — Z992 Dependence on renal dialysis: Secondary | ICD-10-CM | POA: Diagnosis not present

## 2018-03-08 DIAGNOSIS — D631 Anemia in chronic kidney disease: Secondary | ICD-10-CM | POA: Diagnosis not present

## 2018-03-08 DIAGNOSIS — D509 Iron deficiency anemia, unspecified: Secondary | ICD-10-CM | POA: Diagnosis not present

## 2018-03-11 DIAGNOSIS — D631 Anemia in chronic kidney disease: Secondary | ICD-10-CM | POA: Diagnosis not present

## 2018-03-11 DIAGNOSIS — Z992 Dependence on renal dialysis: Secondary | ICD-10-CM | POA: Diagnosis not present

## 2018-03-11 DIAGNOSIS — N2581 Secondary hyperparathyroidism of renal origin: Secondary | ICD-10-CM | POA: Diagnosis not present

## 2018-03-11 DIAGNOSIS — D509 Iron deficiency anemia, unspecified: Secondary | ICD-10-CM | POA: Diagnosis not present

## 2018-03-11 DIAGNOSIS — N186 End stage renal disease: Secondary | ICD-10-CM | POA: Diagnosis not present

## 2018-03-12 DIAGNOSIS — N186 End stage renal disease: Secondary | ICD-10-CM | POA: Diagnosis not present

## 2018-03-12 DIAGNOSIS — Z992 Dependence on renal dialysis: Secondary | ICD-10-CM | POA: Diagnosis not present

## 2018-03-13 DIAGNOSIS — D631 Anemia in chronic kidney disease: Secondary | ICD-10-CM | POA: Diagnosis not present

## 2018-03-13 DIAGNOSIS — Z992 Dependence on renal dialysis: Secondary | ICD-10-CM | POA: Diagnosis not present

## 2018-03-13 DIAGNOSIS — N2581 Secondary hyperparathyroidism of renal origin: Secondary | ICD-10-CM | POA: Diagnosis not present

## 2018-03-13 DIAGNOSIS — N186 End stage renal disease: Secondary | ICD-10-CM | POA: Diagnosis not present

## 2018-03-13 DIAGNOSIS — D509 Iron deficiency anemia, unspecified: Secondary | ICD-10-CM | POA: Diagnosis not present

## 2018-03-15 DIAGNOSIS — D509 Iron deficiency anemia, unspecified: Secondary | ICD-10-CM | POA: Diagnosis not present

## 2018-03-15 DIAGNOSIS — N186 End stage renal disease: Secondary | ICD-10-CM | POA: Diagnosis not present

## 2018-03-15 DIAGNOSIS — N2581 Secondary hyperparathyroidism of renal origin: Secondary | ICD-10-CM | POA: Diagnosis not present

## 2018-03-15 DIAGNOSIS — Z992 Dependence on renal dialysis: Secondary | ICD-10-CM | POA: Diagnosis not present

## 2018-03-15 DIAGNOSIS — D631 Anemia in chronic kidney disease: Secondary | ICD-10-CM | POA: Diagnosis not present

## 2018-03-18 DIAGNOSIS — N2581 Secondary hyperparathyroidism of renal origin: Secondary | ICD-10-CM | POA: Diagnosis not present

## 2018-03-18 DIAGNOSIS — Z992 Dependence on renal dialysis: Secondary | ICD-10-CM | POA: Diagnosis not present

## 2018-03-18 DIAGNOSIS — D631 Anemia in chronic kidney disease: Secondary | ICD-10-CM | POA: Diagnosis not present

## 2018-03-18 DIAGNOSIS — D509 Iron deficiency anemia, unspecified: Secondary | ICD-10-CM | POA: Diagnosis not present

## 2018-03-18 DIAGNOSIS — N186 End stage renal disease: Secondary | ICD-10-CM | POA: Diagnosis not present

## 2018-03-20 DIAGNOSIS — N2581 Secondary hyperparathyroidism of renal origin: Secondary | ICD-10-CM | POA: Diagnosis not present

## 2018-03-20 DIAGNOSIS — N186 End stage renal disease: Secondary | ICD-10-CM | POA: Diagnosis not present

## 2018-03-20 DIAGNOSIS — Z992 Dependence on renal dialysis: Secondary | ICD-10-CM | POA: Diagnosis not present

## 2018-03-20 DIAGNOSIS — D631 Anemia in chronic kidney disease: Secondary | ICD-10-CM | POA: Diagnosis not present

## 2018-03-20 DIAGNOSIS — D509 Iron deficiency anemia, unspecified: Secondary | ICD-10-CM | POA: Diagnosis not present

## 2018-03-22 DIAGNOSIS — N2581 Secondary hyperparathyroidism of renal origin: Secondary | ICD-10-CM | POA: Diagnosis not present

## 2018-03-22 DIAGNOSIS — Z992 Dependence on renal dialysis: Secondary | ICD-10-CM | POA: Diagnosis not present

## 2018-03-22 DIAGNOSIS — D509 Iron deficiency anemia, unspecified: Secondary | ICD-10-CM | POA: Diagnosis not present

## 2018-03-22 DIAGNOSIS — N186 End stage renal disease: Secondary | ICD-10-CM | POA: Diagnosis not present

## 2018-03-22 DIAGNOSIS — D631 Anemia in chronic kidney disease: Secondary | ICD-10-CM | POA: Diagnosis not present

## 2018-03-25 DIAGNOSIS — N2581 Secondary hyperparathyroidism of renal origin: Secondary | ICD-10-CM | POA: Diagnosis not present

## 2018-03-25 DIAGNOSIS — Z992 Dependence on renal dialysis: Secondary | ICD-10-CM | POA: Diagnosis not present

## 2018-03-25 DIAGNOSIS — N186 End stage renal disease: Secondary | ICD-10-CM | POA: Diagnosis not present

## 2018-03-25 DIAGNOSIS — D509 Iron deficiency anemia, unspecified: Secondary | ICD-10-CM | POA: Diagnosis not present

## 2018-03-25 DIAGNOSIS — D631 Anemia in chronic kidney disease: Secondary | ICD-10-CM | POA: Diagnosis not present

## 2018-03-27 DIAGNOSIS — Z992 Dependence on renal dialysis: Secondary | ICD-10-CM | POA: Diagnosis not present

## 2018-03-27 DIAGNOSIS — D631 Anemia in chronic kidney disease: Secondary | ICD-10-CM | POA: Diagnosis not present

## 2018-03-27 DIAGNOSIS — N186 End stage renal disease: Secondary | ICD-10-CM | POA: Diagnosis not present

## 2018-03-27 DIAGNOSIS — D509 Iron deficiency anemia, unspecified: Secondary | ICD-10-CM | POA: Diagnosis not present

## 2018-03-27 DIAGNOSIS — N2581 Secondary hyperparathyroidism of renal origin: Secondary | ICD-10-CM | POA: Diagnosis not present

## 2018-03-29 DIAGNOSIS — D631 Anemia in chronic kidney disease: Secondary | ICD-10-CM | POA: Diagnosis not present

## 2018-03-29 DIAGNOSIS — N186 End stage renal disease: Secondary | ICD-10-CM | POA: Diagnosis not present

## 2018-03-29 DIAGNOSIS — D509 Iron deficiency anemia, unspecified: Secondary | ICD-10-CM | POA: Diagnosis not present

## 2018-03-29 DIAGNOSIS — Z992 Dependence on renal dialysis: Secondary | ICD-10-CM | POA: Diagnosis not present

## 2018-03-29 DIAGNOSIS — N2581 Secondary hyperparathyroidism of renal origin: Secondary | ICD-10-CM | POA: Diagnosis not present

## 2018-04-01 DIAGNOSIS — N186 End stage renal disease: Secondary | ICD-10-CM | POA: Diagnosis not present

## 2018-04-01 DIAGNOSIS — N2581 Secondary hyperparathyroidism of renal origin: Secondary | ICD-10-CM | POA: Diagnosis not present

## 2018-04-01 DIAGNOSIS — D509 Iron deficiency anemia, unspecified: Secondary | ICD-10-CM | POA: Diagnosis not present

## 2018-04-01 DIAGNOSIS — D631 Anemia in chronic kidney disease: Secondary | ICD-10-CM | POA: Diagnosis not present

## 2018-04-01 DIAGNOSIS — Z992 Dependence on renal dialysis: Secondary | ICD-10-CM | POA: Diagnosis not present

## 2018-04-03 DIAGNOSIS — N2581 Secondary hyperparathyroidism of renal origin: Secondary | ICD-10-CM | POA: Diagnosis not present

## 2018-04-03 DIAGNOSIS — N186 End stage renal disease: Secondary | ICD-10-CM | POA: Diagnosis not present

## 2018-04-03 DIAGNOSIS — Z992 Dependence on renal dialysis: Secondary | ICD-10-CM | POA: Diagnosis not present

## 2018-04-03 DIAGNOSIS — D509 Iron deficiency anemia, unspecified: Secondary | ICD-10-CM | POA: Diagnosis not present

## 2018-04-03 DIAGNOSIS — D631 Anemia in chronic kidney disease: Secondary | ICD-10-CM | POA: Diagnosis not present

## 2018-04-05 DIAGNOSIS — N186 End stage renal disease: Secondary | ICD-10-CM | POA: Diagnosis not present

## 2018-04-05 DIAGNOSIS — D631 Anemia in chronic kidney disease: Secondary | ICD-10-CM | POA: Diagnosis not present

## 2018-04-05 DIAGNOSIS — N2581 Secondary hyperparathyroidism of renal origin: Secondary | ICD-10-CM | POA: Diagnosis not present

## 2018-04-05 DIAGNOSIS — D509 Iron deficiency anemia, unspecified: Secondary | ICD-10-CM | POA: Diagnosis not present

## 2018-04-05 DIAGNOSIS — Z992 Dependence on renal dialysis: Secondary | ICD-10-CM | POA: Diagnosis not present

## 2018-04-08 DIAGNOSIS — N2581 Secondary hyperparathyroidism of renal origin: Secondary | ICD-10-CM | POA: Diagnosis not present

## 2018-04-08 DIAGNOSIS — Z992 Dependence on renal dialysis: Secondary | ICD-10-CM | POA: Diagnosis not present

## 2018-04-08 DIAGNOSIS — N186 End stage renal disease: Secondary | ICD-10-CM | POA: Diagnosis not present

## 2018-04-08 DIAGNOSIS — D509 Iron deficiency anemia, unspecified: Secondary | ICD-10-CM | POA: Diagnosis not present

## 2018-04-08 DIAGNOSIS — D631 Anemia in chronic kidney disease: Secondary | ICD-10-CM | POA: Diagnosis not present

## 2018-04-10 DIAGNOSIS — D509 Iron deficiency anemia, unspecified: Secondary | ICD-10-CM | POA: Diagnosis not present

## 2018-04-10 DIAGNOSIS — Z992 Dependence on renal dialysis: Secondary | ICD-10-CM | POA: Diagnosis not present

## 2018-04-10 DIAGNOSIS — N2581 Secondary hyperparathyroidism of renal origin: Secondary | ICD-10-CM | POA: Diagnosis not present

## 2018-04-10 DIAGNOSIS — N186 End stage renal disease: Secondary | ICD-10-CM | POA: Diagnosis not present

## 2018-04-10 DIAGNOSIS — D631 Anemia in chronic kidney disease: Secondary | ICD-10-CM | POA: Diagnosis not present

## 2018-04-11 DIAGNOSIS — N186 End stage renal disease: Secondary | ICD-10-CM | POA: Diagnosis not present

## 2018-04-11 DIAGNOSIS — Z992 Dependence on renal dialysis: Secondary | ICD-10-CM | POA: Diagnosis not present

## 2018-04-12 DIAGNOSIS — N186 End stage renal disease: Secondary | ICD-10-CM | POA: Diagnosis not present

## 2018-04-12 DIAGNOSIS — D509 Iron deficiency anemia, unspecified: Secondary | ICD-10-CM | POA: Diagnosis not present

## 2018-04-12 DIAGNOSIS — Z992 Dependence on renal dialysis: Secondary | ICD-10-CM | POA: Diagnosis not present

## 2018-04-12 DIAGNOSIS — N2581 Secondary hyperparathyroidism of renal origin: Secondary | ICD-10-CM | POA: Diagnosis not present

## 2018-04-12 DIAGNOSIS — D631 Anemia in chronic kidney disease: Secondary | ICD-10-CM | POA: Diagnosis not present

## 2018-04-13 DIAGNOSIS — Z992 Dependence on renal dialysis: Secondary | ICD-10-CM | POA: Diagnosis not present

## 2018-04-13 DIAGNOSIS — D509 Iron deficiency anemia, unspecified: Secondary | ICD-10-CM | POA: Diagnosis not present

## 2018-04-13 DIAGNOSIS — D631 Anemia in chronic kidney disease: Secondary | ICD-10-CM | POA: Diagnosis not present

## 2018-04-13 DIAGNOSIS — Z23 Encounter for immunization: Secondary | ICD-10-CM | POA: Diagnosis not present

## 2018-04-13 DIAGNOSIS — N2581 Secondary hyperparathyroidism of renal origin: Secondary | ICD-10-CM | POA: Diagnosis not present

## 2018-04-13 DIAGNOSIS — N186 End stage renal disease: Secondary | ICD-10-CM | POA: Diagnosis not present

## 2018-04-15 DIAGNOSIS — Z23 Encounter for immunization: Secondary | ICD-10-CM | POA: Diagnosis not present

## 2018-04-15 DIAGNOSIS — N186 End stage renal disease: Secondary | ICD-10-CM | POA: Diagnosis not present

## 2018-04-15 DIAGNOSIS — N2581 Secondary hyperparathyroidism of renal origin: Secondary | ICD-10-CM | POA: Diagnosis not present

## 2018-04-15 DIAGNOSIS — Z992 Dependence on renal dialysis: Secondary | ICD-10-CM | POA: Diagnosis not present

## 2018-04-15 DIAGNOSIS — D631 Anemia in chronic kidney disease: Secondary | ICD-10-CM | POA: Diagnosis not present

## 2018-04-15 DIAGNOSIS — D509 Iron deficiency anemia, unspecified: Secondary | ICD-10-CM | POA: Diagnosis not present

## 2018-04-17 DIAGNOSIS — Z992 Dependence on renal dialysis: Secondary | ICD-10-CM | POA: Diagnosis not present

## 2018-04-17 DIAGNOSIS — D631 Anemia in chronic kidney disease: Secondary | ICD-10-CM | POA: Diagnosis not present

## 2018-04-17 DIAGNOSIS — N186 End stage renal disease: Secondary | ICD-10-CM | POA: Diagnosis not present

## 2018-04-17 DIAGNOSIS — Z23 Encounter for immunization: Secondary | ICD-10-CM | POA: Diagnosis not present

## 2018-04-17 DIAGNOSIS — N2581 Secondary hyperparathyroidism of renal origin: Secondary | ICD-10-CM | POA: Diagnosis not present

## 2018-04-17 DIAGNOSIS — D509 Iron deficiency anemia, unspecified: Secondary | ICD-10-CM | POA: Diagnosis not present

## 2018-04-19 DIAGNOSIS — Z23 Encounter for immunization: Secondary | ICD-10-CM | POA: Diagnosis not present

## 2018-04-19 DIAGNOSIS — D631 Anemia in chronic kidney disease: Secondary | ICD-10-CM | POA: Diagnosis not present

## 2018-04-19 DIAGNOSIS — D509 Iron deficiency anemia, unspecified: Secondary | ICD-10-CM | POA: Diagnosis not present

## 2018-04-19 DIAGNOSIS — N186 End stage renal disease: Secondary | ICD-10-CM | POA: Diagnosis not present

## 2018-04-19 DIAGNOSIS — Z992 Dependence on renal dialysis: Secondary | ICD-10-CM | POA: Diagnosis not present

## 2018-04-19 DIAGNOSIS — N2581 Secondary hyperparathyroidism of renal origin: Secondary | ICD-10-CM | POA: Diagnosis not present

## 2018-04-22 DIAGNOSIS — N2581 Secondary hyperparathyroidism of renal origin: Secondary | ICD-10-CM | POA: Diagnosis not present

## 2018-04-22 DIAGNOSIS — N186 End stage renal disease: Secondary | ICD-10-CM | POA: Diagnosis not present

## 2018-04-22 DIAGNOSIS — Z23 Encounter for immunization: Secondary | ICD-10-CM | POA: Diagnosis not present

## 2018-04-22 DIAGNOSIS — D509 Iron deficiency anemia, unspecified: Secondary | ICD-10-CM | POA: Diagnosis not present

## 2018-04-22 DIAGNOSIS — D631 Anemia in chronic kidney disease: Secondary | ICD-10-CM | POA: Diagnosis not present

## 2018-04-22 DIAGNOSIS — Z992 Dependence on renal dialysis: Secondary | ICD-10-CM | POA: Diagnosis not present

## 2018-04-24 DIAGNOSIS — Z992 Dependence on renal dialysis: Secondary | ICD-10-CM | POA: Diagnosis not present

## 2018-04-24 DIAGNOSIS — N186 End stage renal disease: Secondary | ICD-10-CM | POA: Diagnosis not present

## 2018-04-24 DIAGNOSIS — D509 Iron deficiency anemia, unspecified: Secondary | ICD-10-CM | POA: Diagnosis not present

## 2018-04-24 DIAGNOSIS — N2581 Secondary hyperparathyroidism of renal origin: Secondary | ICD-10-CM | POA: Diagnosis not present

## 2018-04-24 DIAGNOSIS — Z23 Encounter for immunization: Secondary | ICD-10-CM | POA: Diagnosis not present

## 2018-04-24 DIAGNOSIS — D631 Anemia in chronic kidney disease: Secondary | ICD-10-CM | POA: Diagnosis not present

## 2018-04-26 DIAGNOSIS — D509 Iron deficiency anemia, unspecified: Secondary | ICD-10-CM | POA: Diagnosis not present

## 2018-04-26 DIAGNOSIS — N2581 Secondary hyperparathyroidism of renal origin: Secondary | ICD-10-CM | POA: Diagnosis not present

## 2018-04-26 DIAGNOSIS — Z992 Dependence on renal dialysis: Secondary | ICD-10-CM | POA: Diagnosis not present

## 2018-04-26 DIAGNOSIS — N186 End stage renal disease: Secondary | ICD-10-CM | POA: Diagnosis not present

## 2018-04-26 DIAGNOSIS — Z23 Encounter for immunization: Secondary | ICD-10-CM | POA: Diagnosis not present

## 2018-04-26 DIAGNOSIS — D631 Anemia in chronic kidney disease: Secondary | ICD-10-CM | POA: Diagnosis not present

## 2018-04-28 DIAGNOSIS — N186 End stage renal disease: Secondary | ICD-10-CM | POA: Diagnosis not present

## 2018-04-28 DIAGNOSIS — D631 Anemia in chronic kidney disease: Secondary | ICD-10-CM | POA: Diagnosis not present

## 2018-04-28 DIAGNOSIS — Z992 Dependence on renal dialysis: Secondary | ICD-10-CM | POA: Diagnosis not present

## 2018-04-28 DIAGNOSIS — D509 Iron deficiency anemia, unspecified: Secondary | ICD-10-CM | POA: Diagnosis not present

## 2018-04-28 DIAGNOSIS — N2581 Secondary hyperparathyroidism of renal origin: Secondary | ICD-10-CM | POA: Diagnosis not present

## 2018-04-28 DIAGNOSIS — Z23 Encounter for immunization: Secondary | ICD-10-CM | POA: Diagnosis not present

## 2018-04-29 DIAGNOSIS — Z23 Encounter for immunization: Secondary | ICD-10-CM | POA: Diagnosis not present

## 2018-04-29 DIAGNOSIS — Z992 Dependence on renal dialysis: Secondary | ICD-10-CM | POA: Diagnosis not present

## 2018-04-29 DIAGNOSIS — D509 Iron deficiency anemia, unspecified: Secondary | ICD-10-CM | POA: Diagnosis not present

## 2018-04-29 DIAGNOSIS — D631 Anemia in chronic kidney disease: Secondary | ICD-10-CM | POA: Diagnosis not present

## 2018-04-29 DIAGNOSIS — N186 End stage renal disease: Secondary | ICD-10-CM | POA: Diagnosis not present

## 2018-04-29 DIAGNOSIS — N2581 Secondary hyperparathyroidism of renal origin: Secondary | ICD-10-CM | POA: Diagnosis not present

## 2018-05-01 DIAGNOSIS — D631 Anemia in chronic kidney disease: Secondary | ICD-10-CM | POA: Diagnosis not present

## 2018-05-01 DIAGNOSIS — N2581 Secondary hyperparathyroidism of renal origin: Secondary | ICD-10-CM | POA: Diagnosis not present

## 2018-05-01 DIAGNOSIS — Z992 Dependence on renal dialysis: Secondary | ICD-10-CM | POA: Diagnosis not present

## 2018-05-01 DIAGNOSIS — D509 Iron deficiency anemia, unspecified: Secondary | ICD-10-CM | POA: Diagnosis not present

## 2018-05-01 DIAGNOSIS — Z23 Encounter for immunization: Secondary | ICD-10-CM | POA: Diagnosis not present

## 2018-05-01 DIAGNOSIS — N186 End stage renal disease: Secondary | ICD-10-CM | POA: Diagnosis not present

## 2018-05-03 DIAGNOSIS — N186 End stage renal disease: Secondary | ICD-10-CM | POA: Diagnosis not present

## 2018-05-03 DIAGNOSIS — D631 Anemia in chronic kidney disease: Secondary | ICD-10-CM | POA: Diagnosis not present

## 2018-05-03 DIAGNOSIS — Z23 Encounter for immunization: Secondary | ICD-10-CM | POA: Diagnosis not present

## 2018-05-03 DIAGNOSIS — D509 Iron deficiency anemia, unspecified: Secondary | ICD-10-CM | POA: Diagnosis not present

## 2018-05-03 DIAGNOSIS — N2581 Secondary hyperparathyroidism of renal origin: Secondary | ICD-10-CM | POA: Diagnosis not present

## 2018-05-03 DIAGNOSIS — Z992 Dependence on renal dialysis: Secondary | ICD-10-CM | POA: Diagnosis not present

## 2018-05-06 DIAGNOSIS — D509 Iron deficiency anemia, unspecified: Secondary | ICD-10-CM | POA: Diagnosis not present

## 2018-05-06 DIAGNOSIS — Z992 Dependence on renal dialysis: Secondary | ICD-10-CM | POA: Diagnosis not present

## 2018-05-06 DIAGNOSIS — Z794 Long term (current) use of insulin: Secondary | ICD-10-CM | POA: Diagnosis not present

## 2018-05-06 DIAGNOSIS — N2581 Secondary hyperparathyroidism of renal origin: Secondary | ICD-10-CM | POA: Diagnosis not present

## 2018-05-06 DIAGNOSIS — E119 Type 2 diabetes mellitus without complications: Secondary | ICD-10-CM | POA: Diagnosis not present

## 2018-05-06 DIAGNOSIS — D631 Anemia in chronic kidney disease: Secondary | ICD-10-CM | POA: Diagnosis not present

## 2018-05-06 DIAGNOSIS — Z23 Encounter for immunization: Secondary | ICD-10-CM | POA: Diagnosis not present

## 2018-05-06 DIAGNOSIS — N186 End stage renal disease: Secondary | ICD-10-CM | POA: Diagnosis not present

## 2018-05-08 DIAGNOSIS — D631 Anemia in chronic kidney disease: Secondary | ICD-10-CM | POA: Diagnosis not present

## 2018-05-08 DIAGNOSIS — N2581 Secondary hyperparathyroidism of renal origin: Secondary | ICD-10-CM | POA: Diagnosis not present

## 2018-05-08 DIAGNOSIS — Z992 Dependence on renal dialysis: Secondary | ICD-10-CM | POA: Diagnosis not present

## 2018-05-08 DIAGNOSIS — N186 End stage renal disease: Secondary | ICD-10-CM | POA: Diagnosis not present

## 2018-05-08 DIAGNOSIS — D509 Iron deficiency anemia, unspecified: Secondary | ICD-10-CM | POA: Diagnosis not present

## 2018-05-08 DIAGNOSIS — Z23 Encounter for immunization: Secondary | ICD-10-CM | POA: Diagnosis not present

## 2018-05-10 DIAGNOSIS — N186 End stage renal disease: Secondary | ICD-10-CM | POA: Diagnosis not present

## 2018-05-10 DIAGNOSIS — D509 Iron deficiency anemia, unspecified: Secondary | ICD-10-CM | POA: Diagnosis not present

## 2018-05-10 DIAGNOSIS — N2581 Secondary hyperparathyroidism of renal origin: Secondary | ICD-10-CM | POA: Diagnosis not present

## 2018-05-10 DIAGNOSIS — Z23 Encounter for immunization: Secondary | ICD-10-CM | POA: Diagnosis not present

## 2018-05-10 DIAGNOSIS — Z992 Dependence on renal dialysis: Secondary | ICD-10-CM | POA: Diagnosis not present

## 2018-05-10 DIAGNOSIS — D631 Anemia in chronic kidney disease: Secondary | ICD-10-CM | POA: Diagnosis not present

## 2018-05-12 DIAGNOSIS — N186 End stage renal disease: Secondary | ICD-10-CM | POA: Diagnosis not present

## 2018-05-12 DIAGNOSIS — Z992 Dependence on renal dialysis: Secondary | ICD-10-CM | POA: Diagnosis not present

## 2018-05-13 DIAGNOSIS — D509 Iron deficiency anemia, unspecified: Secondary | ICD-10-CM | POA: Diagnosis not present

## 2018-05-13 DIAGNOSIS — N2581 Secondary hyperparathyroidism of renal origin: Secondary | ICD-10-CM | POA: Diagnosis not present

## 2018-05-13 DIAGNOSIS — D631 Anemia in chronic kidney disease: Secondary | ICD-10-CM | POA: Diagnosis not present

## 2018-05-13 DIAGNOSIS — Z992 Dependence on renal dialysis: Secondary | ICD-10-CM | POA: Diagnosis not present

## 2018-05-13 DIAGNOSIS — N186 End stage renal disease: Secondary | ICD-10-CM | POA: Diagnosis not present

## 2018-05-15 DIAGNOSIS — D509 Iron deficiency anemia, unspecified: Secondary | ICD-10-CM | POA: Diagnosis not present

## 2018-05-15 DIAGNOSIS — Z992 Dependence on renal dialysis: Secondary | ICD-10-CM | POA: Diagnosis not present

## 2018-05-15 DIAGNOSIS — N186 End stage renal disease: Secondary | ICD-10-CM | POA: Diagnosis not present

## 2018-05-15 DIAGNOSIS — D631 Anemia in chronic kidney disease: Secondary | ICD-10-CM | POA: Diagnosis not present

## 2018-05-15 DIAGNOSIS — N2581 Secondary hyperparathyroidism of renal origin: Secondary | ICD-10-CM | POA: Diagnosis not present

## 2018-05-17 DIAGNOSIS — Z992 Dependence on renal dialysis: Secondary | ICD-10-CM | POA: Diagnosis not present

## 2018-05-17 DIAGNOSIS — N2581 Secondary hyperparathyroidism of renal origin: Secondary | ICD-10-CM | POA: Diagnosis not present

## 2018-05-17 DIAGNOSIS — D631 Anemia in chronic kidney disease: Secondary | ICD-10-CM | POA: Diagnosis not present

## 2018-05-17 DIAGNOSIS — D509 Iron deficiency anemia, unspecified: Secondary | ICD-10-CM | POA: Diagnosis not present

## 2018-05-17 DIAGNOSIS — N186 End stage renal disease: Secondary | ICD-10-CM | POA: Diagnosis not present

## 2018-05-20 DIAGNOSIS — N2581 Secondary hyperparathyroidism of renal origin: Secondary | ICD-10-CM | POA: Diagnosis not present

## 2018-05-20 DIAGNOSIS — D509 Iron deficiency anemia, unspecified: Secondary | ICD-10-CM | POA: Diagnosis not present

## 2018-05-20 DIAGNOSIS — N186 End stage renal disease: Secondary | ICD-10-CM | POA: Diagnosis not present

## 2018-05-20 DIAGNOSIS — Z992 Dependence on renal dialysis: Secondary | ICD-10-CM | POA: Diagnosis not present

## 2018-05-20 DIAGNOSIS — D631 Anemia in chronic kidney disease: Secondary | ICD-10-CM | POA: Diagnosis not present

## 2018-05-22 DIAGNOSIS — N186 End stage renal disease: Secondary | ICD-10-CM | POA: Diagnosis not present

## 2018-05-22 DIAGNOSIS — Z992 Dependence on renal dialysis: Secondary | ICD-10-CM | POA: Diagnosis not present

## 2018-05-22 DIAGNOSIS — D631 Anemia in chronic kidney disease: Secondary | ICD-10-CM | POA: Diagnosis not present

## 2018-05-22 DIAGNOSIS — N2581 Secondary hyperparathyroidism of renal origin: Secondary | ICD-10-CM | POA: Diagnosis not present

## 2018-05-22 DIAGNOSIS — D509 Iron deficiency anemia, unspecified: Secondary | ICD-10-CM | POA: Diagnosis not present

## 2018-05-24 DIAGNOSIS — Z992 Dependence on renal dialysis: Secondary | ICD-10-CM | POA: Diagnosis not present

## 2018-05-24 DIAGNOSIS — D631 Anemia in chronic kidney disease: Secondary | ICD-10-CM | POA: Diagnosis not present

## 2018-05-24 DIAGNOSIS — D509 Iron deficiency anemia, unspecified: Secondary | ICD-10-CM | POA: Diagnosis not present

## 2018-05-24 DIAGNOSIS — N2581 Secondary hyperparathyroidism of renal origin: Secondary | ICD-10-CM | POA: Diagnosis not present

## 2018-05-24 DIAGNOSIS — N186 End stage renal disease: Secondary | ICD-10-CM | POA: Diagnosis not present

## 2018-05-27 DIAGNOSIS — N186 End stage renal disease: Secondary | ICD-10-CM | POA: Diagnosis not present

## 2018-05-27 DIAGNOSIS — D631 Anemia in chronic kidney disease: Secondary | ICD-10-CM | POA: Diagnosis not present

## 2018-05-27 DIAGNOSIS — Z992 Dependence on renal dialysis: Secondary | ICD-10-CM | POA: Diagnosis not present

## 2018-05-27 DIAGNOSIS — N2581 Secondary hyperparathyroidism of renal origin: Secondary | ICD-10-CM | POA: Diagnosis not present

## 2018-05-27 DIAGNOSIS — D509 Iron deficiency anemia, unspecified: Secondary | ICD-10-CM | POA: Diagnosis not present

## 2018-05-28 DIAGNOSIS — Z992 Dependence on renal dialysis: Secondary | ICD-10-CM | POA: Diagnosis not present

## 2018-05-28 DIAGNOSIS — N2581 Secondary hyperparathyroidism of renal origin: Secondary | ICD-10-CM | POA: Diagnosis not present

## 2018-05-28 DIAGNOSIS — D509 Iron deficiency anemia, unspecified: Secondary | ICD-10-CM | POA: Diagnosis not present

## 2018-05-28 DIAGNOSIS — D631 Anemia in chronic kidney disease: Secondary | ICD-10-CM | POA: Diagnosis not present

## 2018-05-28 DIAGNOSIS — N186 End stage renal disease: Secondary | ICD-10-CM | POA: Diagnosis not present

## 2018-05-29 ENCOUNTER — Other Ambulatory Visit: Payer: Self-pay

## 2018-05-29 DIAGNOSIS — D631 Anemia in chronic kidney disease: Secondary | ICD-10-CM | POA: Diagnosis not present

## 2018-05-29 DIAGNOSIS — N186 End stage renal disease: Secondary | ICD-10-CM | POA: Diagnosis not present

## 2018-05-29 DIAGNOSIS — N2581 Secondary hyperparathyroidism of renal origin: Secondary | ICD-10-CM | POA: Diagnosis not present

## 2018-05-29 DIAGNOSIS — Z992 Dependence on renal dialysis: Secondary | ICD-10-CM | POA: Diagnosis not present

## 2018-05-29 DIAGNOSIS — D509 Iron deficiency anemia, unspecified: Secondary | ICD-10-CM | POA: Diagnosis not present

## 2018-05-31 DIAGNOSIS — N186 End stage renal disease: Secondary | ICD-10-CM | POA: Diagnosis not present

## 2018-05-31 DIAGNOSIS — D631 Anemia in chronic kidney disease: Secondary | ICD-10-CM | POA: Diagnosis not present

## 2018-05-31 DIAGNOSIS — D509 Iron deficiency anemia, unspecified: Secondary | ICD-10-CM | POA: Diagnosis not present

## 2018-05-31 DIAGNOSIS — Z992 Dependence on renal dialysis: Secondary | ICD-10-CM | POA: Diagnosis not present

## 2018-05-31 DIAGNOSIS — N2581 Secondary hyperparathyroidism of renal origin: Secondary | ICD-10-CM | POA: Diagnosis not present

## 2018-06-02 ENCOUNTER — Ambulatory Visit (INDEPENDENT_AMBULATORY_CARE_PROVIDER_SITE_OTHER): Payer: Medicare Other | Admitting: Vascular Surgery

## 2018-06-02 ENCOUNTER — Ambulatory Visit (HOSPITAL_COMMUNITY)
Admission: RE | Admit: 2018-06-02 | Discharge: 2018-06-02 | Disposition: A | Discharge status: Home / Self Care | Payer: Medicare Other | Source: Ambulatory Visit | Attending: Vascular Surgery | Admitting: Vascular Surgery

## 2018-06-02 ENCOUNTER — Encounter: Payer: Self-pay | Admitting: Vascular Surgery

## 2018-06-02 VITALS — BP 140/73 | HR 80 | Temp 96.9°F | Resp 20 | Ht 76.0 in | Wt 264.4 lb

## 2018-06-02 DIAGNOSIS — N186 End stage renal disease: Secondary | ICD-10-CM

## 2018-06-02 DIAGNOSIS — T82898A Other specified complication of vascular prosthetic devices, implants and grafts, initial encounter: Secondary | ICD-10-CM | POA: Diagnosis not present

## 2018-06-02 DIAGNOSIS — Z992 Dependence on renal dialysis: Secondary | ICD-10-CM | POA: Insufficient documentation

## 2018-06-02 NOTE — Progress Notes (Signed)
[facility] [place]  Patient name: [name] MRN: [id number] DOB: June 04, 1951 Sex: male  REASON FOR VISIT: Evaluate left upper arm AV fistula  Seen today in our [place] office  HPI: [name] is a 67 y.o. male here today for evaluation of left upper arm AV fistula.  He is here today with his wife.  He is a very reliable historian.  This left upper arm fistula is been present since 2012.  He did have superficial mobilization of the fistula and has had excellent long-term use of it.  He reports that over the past 2 years he has had 2 episodes of prolonged bleeding most recent was approximately 1 year ago.  He does note that there is more of a pulsatile nature than a thrill in the fistula and reports that this is been present for over one year.  He reports that he is having no difficulty at hemodialysis.  He reports the technicians are having no difficulty accessing his fistula and also reports that he is not clotting the machines and is not been told that he is having high pressures.  Past Medical History:  Diagnosis Date  . Anemia   . Anxiety   . Arthritis   . Chronic kidney disease   . COPD (chronic obstructive pulmonary disease) ([place])    Pt denies  . Depression   . Diabetes mellitus without complication ([place])   . GERD (gastroesophageal reflux disease)   . H/O hiatal hernia   . Headache(784.0)    Hx: Migraines  . Hypertension   . Neuropathy   . Numbness of toes    toes and feet  . Pneumonia    Hx: of several times  . Renal insufficiency   . Shortness of breath dyspnea   . Type 2 diabetes mellitus ([facility])     Family History  Problem Relation Age of Onset  . Heart disease Father        Heart Disease before age 19    SOCIAL HISTORY: Social History   Tobacco Use  . Smoking status: Former Smoker    Years: 10.00    Types: Cigars    Last attempt to quit: 03/13/2012    Years since quitting: 6.2  .  Smokeless tobacco: Never Used  Substance Use Topics  . Alcohol use: No    Alcohol/week: 0.0 standard drinks    Allergies  Allergen Reactions  . Morphine And Related Other (See Comments)    Not effective  . Zaroxolyn [Metolazone]     Current Outpatient Medications  Medication Sig Dispense Refill  . ARTIFICIAL TEAR OP Place 1 drop into both eyes every 6 (six) hours as needed (dry eyes).    Marland Kitchen atorvastatin (LIPITOR) 80 MG tablet Take 80 mg by mouth at bedtime.  5  . calcium acetate (PHOSLO) 667 MG capsule Take 2 capsules (1,334 mg total) by mouth 3 (three) times daily with meals.  10  . levothyroxine (SYNTHROID, LEVOTHROID) 125 MCG tablet Take 125 mcg by mouth daily before breakfast.    . nortriptyline (PAMELOR) 25 MG capsule Take 50 mg by mouth at bedtime.   10  . polyethylene glycol (MIRALAX / GLYCOLAX) packet Take 17 g by mouth daily.    . risperiDONE (RISPERDAL) 0.25 MG tablet Take 0.25 mg by mouth daily.    Marland Kitchen SALINE NASAL SPRAY NA Place 2 sprays into both nostrils as needed (seasonal allergies).    [name] 300 UNIT/ML SOPN Inject 36  Units as directed at bedtime.  10  . traZODone (DESYREL) 50 MG tablet Take 50 mg by mouth daily.   10  . cinacalcet (SENSIPAR) 30 MG tablet Take 30 mg by mouth every evening.    Marland Kitchen lisinopril (PRINIVIL,ZESTRIL) 10 MG tablet Take 10 mg by mouth daily.    Marland Kitchen LYRICA 50 MG capsule Take 1 tablet by mouth 3 (three) times daily.  4   No current facility-administered medications for this visit.     REVIEW OF SYSTEMS:  [X]  denotes positive finding, [ ]  denotes negative finding Cardiac  Comments:  Chest pain or chest pressure:    Shortness of breath upon exertion: x   Short of breath when lying flat:    Irregular heart rhythm:        Vascular    Pain in calf, thigh, or hip brought on by ambulation:    Pain in feet at night that wakes you up from your sleep:  x   Blood clot in your veins:    Leg swelling:           PHYSICAL EXAM: Vitals:    06/02/18 1445  BP: 140/73  Pulse: 80  Resp: 20  Temp: (!) 96.9 F (36.1 C)  TempSrc: Temporal  Weight: 264 lb 6.4 oz (119.9 kg)  Height: 6\' 4"  (1.93 m)    GENERAL: The patient is a well-nourished male, in no acute distress. The vital signs are documented above. CARDIOVASCULAR: Is have a 2+ distal left radial pulse.  Has a well-developed left upper arm cephalic vein fistula.  There is no evidence of skin breakdown.  He has diffuse enlargement throughout the course of this.  There is somewhat of a pulsatile nature of the fistula.  This is easily palpable up to the level of his shoulder. PULMONARY: There is good air exchange  MUSCULOSKELETAL: There are no major deformities or cyanosis. NEUROLOGIC: No focal weakness or paresthesias are detected. SKIN: There are no ulcers or rashes noted. PSYCHIATRIC: The patient has a normal affect.  DATA:  Duplex today of his fistula at [facility] shows no evidence of critical stenosis.  MEDICAL ISSUES: I had a long discussion with the patient and his wife.  I explained that he may have some central narrowing that would explain the pulsatile nature of his fistula.  I did explain that in my opinion as long as he is not having issues with technical ability to access the fistula and is having good runs, I would not recommend any prophylactic treatment of his fistula.  He reports that he is been not told any such issues.  If this does become the case, would obtain outpatient fistulogram at [facility] with possible central intervention.  He will notify should he develop any difficulties.  Otherwise will be seen on an as-needed basis    Rosetta Posner, MD [facility] Tel [phone] Pager [phone]

## 2018-06-03 DIAGNOSIS — N2581 Secondary hyperparathyroidism of renal origin: Secondary | ICD-10-CM | POA: Diagnosis not present

## 2018-06-03 DIAGNOSIS — D631 Anemia in chronic kidney disease: Secondary | ICD-10-CM | POA: Diagnosis not present

## 2018-06-03 DIAGNOSIS — N186 End stage renal disease: Secondary | ICD-10-CM | POA: Diagnosis not present

## 2018-06-03 DIAGNOSIS — Z992 Dependence on renal dialysis: Secondary | ICD-10-CM | POA: Diagnosis not present

## 2018-06-03 DIAGNOSIS — D509 Iron deficiency anemia, unspecified: Secondary | ICD-10-CM | POA: Diagnosis not present

## 2018-06-05 DIAGNOSIS — D509 Iron deficiency anemia, unspecified: Secondary | ICD-10-CM | POA: Diagnosis not present

## 2018-06-05 DIAGNOSIS — N2581 Secondary hyperparathyroidism of renal origin: Secondary | ICD-10-CM | POA: Diagnosis not present

## 2018-06-05 DIAGNOSIS — Z992 Dependence on renal dialysis: Secondary | ICD-10-CM | POA: Diagnosis not present

## 2018-06-05 DIAGNOSIS — D631 Anemia in chronic kidney disease: Secondary | ICD-10-CM | POA: Diagnosis not present

## 2018-06-05 DIAGNOSIS — N186 End stage renal disease: Secondary | ICD-10-CM | POA: Diagnosis not present

## 2018-06-07 DIAGNOSIS — D509 Iron deficiency anemia, unspecified: Secondary | ICD-10-CM | POA: Diagnosis not present

## 2018-06-07 DIAGNOSIS — N186 End stage renal disease: Secondary | ICD-10-CM | POA: Diagnosis not present

## 2018-06-07 DIAGNOSIS — D631 Anemia in chronic kidney disease: Secondary | ICD-10-CM | POA: Diagnosis not present

## 2018-06-07 DIAGNOSIS — Z992 Dependence on renal dialysis: Secondary | ICD-10-CM | POA: Diagnosis not present

## 2018-06-07 DIAGNOSIS — N2581 Secondary hyperparathyroidism of renal origin: Secondary | ICD-10-CM | POA: Diagnosis not present

## 2018-06-10 DIAGNOSIS — N186 End stage renal disease: Secondary | ICD-10-CM | POA: Diagnosis not present

## 2018-06-10 DIAGNOSIS — Z992 Dependence on renal dialysis: Secondary | ICD-10-CM | POA: Diagnosis not present

## 2018-06-10 DIAGNOSIS — D509 Iron deficiency anemia, unspecified: Secondary | ICD-10-CM | POA: Diagnosis not present

## 2018-06-10 DIAGNOSIS — D631 Anemia in chronic kidney disease: Secondary | ICD-10-CM | POA: Diagnosis not present

## 2018-06-10 DIAGNOSIS — N2581 Secondary hyperparathyroidism of renal origin: Secondary | ICD-10-CM | POA: Diagnosis not present

## 2018-06-12 DIAGNOSIS — Z992 Dependence on renal dialysis: Secondary | ICD-10-CM | POA: Diagnosis not present

## 2018-06-12 DIAGNOSIS — N2581 Secondary hyperparathyroidism of renal origin: Secondary | ICD-10-CM | POA: Diagnosis not present

## 2018-06-12 DIAGNOSIS — D509 Iron deficiency anemia, unspecified: Secondary | ICD-10-CM | POA: Diagnosis not present

## 2018-06-12 DIAGNOSIS — N186 End stage renal disease: Secondary | ICD-10-CM | POA: Diagnosis not present

## 2018-06-12 DIAGNOSIS — D631 Anemia in chronic kidney disease: Secondary | ICD-10-CM | POA: Diagnosis not present

## 2018-06-13 DIAGNOSIS — D509 Iron deficiency anemia, unspecified: Secondary | ICD-10-CM | POA: Diagnosis not present

## 2018-06-13 DIAGNOSIS — D631 Anemia in chronic kidney disease: Secondary | ICD-10-CM | POA: Diagnosis not present

## 2018-06-13 DIAGNOSIS — N186 End stage renal disease: Secondary | ICD-10-CM | POA: Diagnosis not present

## 2018-06-13 DIAGNOSIS — N2581 Secondary hyperparathyroidism of renal origin: Secondary | ICD-10-CM | POA: Diagnosis not present

## 2018-06-13 DIAGNOSIS — Z992 Dependence on renal dialysis: Secondary | ICD-10-CM | POA: Diagnosis not present

## 2018-06-14 DIAGNOSIS — Z992 Dependence on renal dialysis: Secondary | ICD-10-CM | POA: Diagnosis not present

## 2018-06-14 DIAGNOSIS — N2581 Secondary hyperparathyroidism of renal origin: Secondary | ICD-10-CM | POA: Diagnosis not present

## 2018-06-14 DIAGNOSIS — D631 Anemia in chronic kidney disease: Secondary | ICD-10-CM | POA: Diagnosis not present

## 2018-06-14 DIAGNOSIS — D509 Iron deficiency anemia, unspecified: Secondary | ICD-10-CM | POA: Diagnosis not present

## 2018-06-14 DIAGNOSIS — N186 End stage renal disease: Secondary | ICD-10-CM | POA: Diagnosis not present

## 2018-06-17 DIAGNOSIS — Z992 Dependence on renal dialysis: Secondary | ICD-10-CM | POA: Diagnosis not present

## 2018-06-17 DIAGNOSIS — D509 Iron deficiency anemia, unspecified: Secondary | ICD-10-CM | POA: Diagnosis not present

## 2018-06-17 DIAGNOSIS — D631 Anemia in chronic kidney disease: Secondary | ICD-10-CM | POA: Diagnosis not present

## 2018-06-17 DIAGNOSIS — N186 End stage renal disease: Secondary | ICD-10-CM | POA: Diagnosis not present

## 2018-06-17 DIAGNOSIS — N2581 Secondary hyperparathyroidism of renal origin: Secondary | ICD-10-CM | POA: Diagnosis not present

## 2018-06-19 DIAGNOSIS — Z992 Dependence on renal dialysis: Secondary | ICD-10-CM | POA: Diagnosis not present

## 2018-06-19 DIAGNOSIS — N2581 Secondary hyperparathyroidism of renal origin: Secondary | ICD-10-CM | POA: Diagnosis not present

## 2018-06-19 DIAGNOSIS — N186 End stage renal disease: Secondary | ICD-10-CM | POA: Diagnosis not present

## 2018-06-19 DIAGNOSIS — D631 Anemia in chronic kidney disease: Secondary | ICD-10-CM | POA: Diagnosis not present

## 2018-06-19 DIAGNOSIS — D509 Iron deficiency anemia, unspecified: Secondary | ICD-10-CM | POA: Diagnosis not present

## 2018-06-21 DIAGNOSIS — Z992 Dependence on renal dialysis: Secondary | ICD-10-CM | POA: Diagnosis not present

## 2018-06-21 DIAGNOSIS — D631 Anemia in chronic kidney disease: Secondary | ICD-10-CM | POA: Diagnosis not present

## 2018-06-21 DIAGNOSIS — D509 Iron deficiency anemia, unspecified: Secondary | ICD-10-CM | POA: Diagnosis not present

## 2018-06-21 DIAGNOSIS — N186 End stage renal disease: Secondary | ICD-10-CM | POA: Diagnosis not present

## 2018-06-21 DIAGNOSIS — N2581 Secondary hyperparathyroidism of renal origin: Secondary | ICD-10-CM | POA: Diagnosis not present

## 2018-06-24 DIAGNOSIS — N2581 Secondary hyperparathyroidism of renal origin: Secondary | ICD-10-CM | POA: Diagnosis not present

## 2018-06-24 DIAGNOSIS — D631 Anemia in chronic kidney disease: Secondary | ICD-10-CM | POA: Diagnosis not present

## 2018-06-24 DIAGNOSIS — D509 Iron deficiency anemia, unspecified: Secondary | ICD-10-CM | POA: Diagnosis not present

## 2018-06-24 DIAGNOSIS — N186 End stage renal disease: Secondary | ICD-10-CM | POA: Diagnosis not present

## 2018-06-24 DIAGNOSIS — Z992 Dependence on renal dialysis: Secondary | ICD-10-CM | POA: Diagnosis not present

## 2018-06-26 DIAGNOSIS — D631 Anemia in chronic kidney disease: Secondary | ICD-10-CM | POA: Diagnosis not present

## 2018-06-26 DIAGNOSIS — N2581 Secondary hyperparathyroidism of renal origin: Secondary | ICD-10-CM | POA: Diagnosis not present

## 2018-06-26 DIAGNOSIS — N186 End stage renal disease: Secondary | ICD-10-CM | POA: Diagnosis not present

## 2018-06-26 DIAGNOSIS — Z992 Dependence on renal dialysis: Secondary | ICD-10-CM | POA: Diagnosis not present

## 2018-06-26 DIAGNOSIS — D509 Iron deficiency anemia, unspecified: Secondary | ICD-10-CM | POA: Diagnosis not present

## 2018-06-27 DIAGNOSIS — N186 End stage renal disease: Secondary | ICD-10-CM | POA: Diagnosis not present

## 2018-06-27 DIAGNOSIS — D631 Anemia in chronic kidney disease: Secondary | ICD-10-CM | POA: Diagnosis not present

## 2018-06-27 DIAGNOSIS — Z992 Dependence on renal dialysis: Secondary | ICD-10-CM | POA: Diagnosis not present

## 2018-06-27 DIAGNOSIS — N2581 Secondary hyperparathyroidism of renal origin: Secondary | ICD-10-CM | POA: Diagnosis not present

## 2018-06-27 DIAGNOSIS — D509 Iron deficiency anemia, unspecified: Secondary | ICD-10-CM | POA: Diagnosis not present

## 2018-06-28 DIAGNOSIS — D509 Iron deficiency anemia, unspecified: Secondary | ICD-10-CM | POA: Diagnosis not present

## 2018-06-28 DIAGNOSIS — D631 Anemia in chronic kidney disease: Secondary | ICD-10-CM | POA: Diagnosis not present

## 2018-06-28 DIAGNOSIS — N186 End stage renal disease: Secondary | ICD-10-CM | POA: Diagnosis not present

## 2018-06-28 DIAGNOSIS — N2581 Secondary hyperparathyroidism of renal origin: Secondary | ICD-10-CM | POA: Diagnosis not present

## 2018-06-28 DIAGNOSIS — Z992 Dependence on renal dialysis: Secondary | ICD-10-CM | POA: Diagnosis not present

## 2018-07-01 DIAGNOSIS — N2581 Secondary hyperparathyroidism of renal origin: Secondary | ICD-10-CM | POA: Diagnosis not present

## 2018-07-01 DIAGNOSIS — D631 Anemia in chronic kidney disease: Secondary | ICD-10-CM | POA: Diagnosis not present

## 2018-07-01 DIAGNOSIS — D509 Iron deficiency anemia, unspecified: Secondary | ICD-10-CM | POA: Diagnosis not present

## 2018-07-01 DIAGNOSIS — Z992 Dependence on renal dialysis: Secondary | ICD-10-CM | POA: Diagnosis not present

## 2018-07-01 DIAGNOSIS — N186 End stage renal disease: Secondary | ICD-10-CM | POA: Diagnosis not present

## 2018-07-03 DIAGNOSIS — D631 Anemia in chronic kidney disease: Secondary | ICD-10-CM | POA: Diagnosis not present

## 2018-07-03 DIAGNOSIS — D509 Iron deficiency anemia, unspecified: Secondary | ICD-10-CM | POA: Diagnosis not present

## 2018-07-03 DIAGNOSIS — N186 End stage renal disease: Secondary | ICD-10-CM | POA: Diagnosis not present

## 2018-07-03 DIAGNOSIS — N2581 Secondary hyperparathyroidism of renal origin: Secondary | ICD-10-CM | POA: Diagnosis not present

## 2018-07-03 DIAGNOSIS — Z992 Dependence on renal dialysis: Secondary | ICD-10-CM | POA: Diagnosis not present

## 2018-07-05 DIAGNOSIS — N186 End stage renal disease: Secondary | ICD-10-CM | POA: Diagnosis not present

## 2018-07-05 DIAGNOSIS — Z992 Dependence on renal dialysis: Secondary | ICD-10-CM | POA: Diagnosis not present

## 2018-07-05 DIAGNOSIS — N2581 Secondary hyperparathyroidism of renal origin: Secondary | ICD-10-CM | POA: Diagnosis not present

## 2018-07-05 DIAGNOSIS — D509 Iron deficiency anemia, unspecified: Secondary | ICD-10-CM | POA: Diagnosis not present

## 2018-07-05 DIAGNOSIS — D631 Anemia in chronic kidney disease: Secondary | ICD-10-CM | POA: Diagnosis not present

## 2018-07-08 DIAGNOSIS — D631 Anemia in chronic kidney disease: Secondary | ICD-10-CM | POA: Diagnosis not present

## 2018-07-08 DIAGNOSIS — N2581 Secondary hyperparathyroidism of renal origin: Secondary | ICD-10-CM | POA: Diagnosis not present

## 2018-07-08 DIAGNOSIS — N186 End stage renal disease: Secondary | ICD-10-CM | POA: Diagnosis not present

## 2018-07-08 DIAGNOSIS — D509 Iron deficiency anemia, unspecified: Secondary | ICD-10-CM | POA: Diagnosis not present

## 2018-07-08 DIAGNOSIS — Z992 Dependence on renal dialysis: Secondary | ICD-10-CM | POA: Diagnosis not present

## 2018-07-10 DIAGNOSIS — N2581 Secondary hyperparathyroidism of renal origin: Secondary | ICD-10-CM | POA: Diagnosis not present

## 2018-07-10 DIAGNOSIS — N186 End stage renal disease: Secondary | ICD-10-CM | POA: Diagnosis not present

## 2018-07-10 DIAGNOSIS — D631 Anemia in chronic kidney disease: Secondary | ICD-10-CM | POA: Diagnosis not present

## 2018-07-10 DIAGNOSIS — D509 Iron deficiency anemia, unspecified: Secondary | ICD-10-CM | POA: Diagnosis not present

## 2018-07-10 DIAGNOSIS — Z992 Dependence on renal dialysis: Secondary | ICD-10-CM | POA: Diagnosis not present

## 2018-07-12 DIAGNOSIS — D509 Iron deficiency anemia, unspecified: Secondary | ICD-10-CM | POA: Diagnosis not present

## 2018-07-12 DIAGNOSIS — N2581 Secondary hyperparathyroidism of renal origin: Secondary | ICD-10-CM | POA: Diagnosis not present

## 2018-07-12 DIAGNOSIS — N186 End stage renal disease: Secondary | ICD-10-CM | POA: Diagnosis not present

## 2018-07-12 DIAGNOSIS — D631 Anemia in chronic kidney disease: Secondary | ICD-10-CM | POA: Diagnosis not present

## 2018-07-12 DIAGNOSIS — Z992 Dependence on renal dialysis: Secondary | ICD-10-CM | POA: Diagnosis not present

## 2018-07-13 DIAGNOSIS — N186 End stage renal disease: Secondary | ICD-10-CM | POA: Diagnosis not present

## 2018-07-13 DIAGNOSIS — Z992 Dependence on renal dialysis: Secondary | ICD-10-CM | POA: Diagnosis not present

## 2018-07-13 DIAGNOSIS — N2581 Secondary hyperparathyroidism of renal origin: Secondary | ICD-10-CM | POA: Diagnosis not present

## 2018-07-13 DIAGNOSIS — D631 Anemia in chronic kidney disease: Secondary | ICD-10-CM | POA: Diagnosis not present

## 2018-07-13 DIAGNOSIS — D509 Iron deficiency anemia, unspecified: Secondary | ICD-10-CM | POA: Diagnosis not present

## 2018-07-15 DIAGNOSIS — N2581 Secondary hyperparathyroidism of renal origin: Secondary | ICD-10-CM | POA: Diagnosis not present

## 2018-07-15 DIAGNOSIS — N186 End stage renal disease: Secondary | ICD-10-CM | POA: Diagnosis not present

## 2018-07-15 DIAGNOSIS — D509 Iron deficiency anemia, unspecified: Secondary | ICD-10-CM | POA: Diagnosis not present

## 2018-07-15 DIAGNOSIS — Z992 Dependence on renal dialysis: Secondary | ICD-10-CM | POA: Diagnosis not present

## 2018-07-15 DIAGNOSIS — D631 Anemia in chronic kidney disease: Secondary | ICD-10-CM | POA: Diagnosis not present

## 2018-07-17 DIAGNOSIS — N2581 Secondary hyperparathyroidism of renal origin: Secondary | ICD-10-CM | POA: Diagnosis not present

## 2018-07-17 DIAGNOSIS — N186 End stage renal disease: Secondary | ICD-10-CM | POA: Diagnosis not present

## 2018-07-17 DIAGNOSIS — Z992 Dependence on renal dialysis: Secondary | ICD-10-CM | POA: Diagnosis not present

## 2018-07-17 DIAGNOSIS — D631 Anemia in chronic kidney disease: Secondary | ICD-10-CM | POA: Diagnosis not present

## 2018-07-17 DIAGNOSIS — D509 Iron deficiency anemia, unspecified: Secondary | ICD-10-CM | POA: Diagnosis not present

## 2018-07-19 DIAGNOSIS — D631 Anemia in chronic kidney disease: Secondary | ICD-10-CM | POA: Diagnosis not present

## 2018-07-19 DIAGNOSIS — Z992 Dependence on renal dialysis: Secondary | ICD-10-CM | POA: Diagnosis not present

## 2018-07-19 DIAGNOSIS — N186 End stage renal disease: Secondary | ICD-10-CM | POA: Diagnosis not present

## 2018-07-19 DIAGNOSIS — N2581 Secondary hyperparathyroidism of renal origin: Secondary | ICD-10-CM | POA: Diagnosis not present

## 2018-07-19 DIAGNOSIS — D509 Iron deficiency anemia, unspecified: Secondary | ICD-10-CM | POA: Diagnosis not present

## 2018-07-22 DIAGNOSIS — N2581 Secondary hyperparathyroidism of renal origin: Secondary | ICD-10-CM | POA: Diagnosis not present

## 2018-07-22 DIAGNOSIS — Z992 Dependence on renal dialysis: Secondary | ICD-10-CM | POA: Diagnosis not present

## 2018-07-22 DIAGNOSIS — D631 Anemia in chronic kidney disease: Secondary | ICD-10-CM | POA: Diagnosis not present

## 2018-07-22 DIAGNOSIS — D509 Iron deficiency anemia, unspecified: Secondary | ICD-10-CM | POA: Diagnosis not present

## 2018-07-22 DIAGNOSIS — N186 End stage renal disease: Secondary | ICD-10-CM | POA: Diagnosis not present

## 2018-07-24 DIAGNOSIS — D509 Iron deficiency anemia, unspecified: Secondary | ICD-10-CM | POA: Diagnosis not present

## 2018-07-24 DIAGNOSIS — Z992 Dependence on renal dialysis: Secondary | ICD-10-CM | POA: Diagnosis not present

## 2018-07-24 DIAGNOSIS — D631 Anemia in chronic kidney disease: Secondary | ICD-10-CM | POA: Diagnosis not present

## 2018-07-24 DIAGNOSIS — N186 End stage renal disease: Secondary | ICD-10-CM | POA: Diagnosis not present

## 2018-07-24 DIAGNOSIS — N2581 Secondary hyperparathyroidism of renal origin: Secondary | ICD-10-CM | POA: Diagnosis not present

## 2018-07-26 DIAGNOSIS — N186 End stage renal disease: Secondary | ICD-10-CM | POA: Diagnosis not present

## 2018-07-26 DIAGNOSIS — Z992 Dependence on renal dialysis: Secondary | ICD-10-CM | POA: Diagnosis not present

## 2018-07-26 DIAGNOSIS — N2581 Secondary hyperparathyroidism of renal origin: Secondary | ICD-10-CM | POA: Diagnosis not present

## 2018-07-26 DIAGNOSIS — D509 Iron deficiency anemia, unspecified: Secondary | ICD-10-CM | POA: Diagnosis not present

## 2018-07-26 DIAGNOSIS — D631 Anemia in chronic kidney disease: Secondary | ICD-10-CM | POA: Diagnosis not present

## 2018-07-27 DIAGNOSIS — D509 Iron deficiency anemia, unspecified: Secondary | ICD-10-CM | POA: Diagnosis not present

## 2018-07-27 DIAGNOSIS — Z992 Dependence on renal dialysis: Secondary | ICD-10-CM | POA: Diagnosis not present

## 2018-07-27 DIAGNOSIS — N186 End stage renal disease: Secondary | ICD-10-CM | POA: Diagnosis not present

## 2018-07-27 DIAGNOSIS — N2581 Secondary hyperparathyroidism of renal origin: Secondary | ICD-10-CM | POA: Diagnosis not present

## 2018-07-27 DIAGNOSIS — D631 Anemia in chronic kidney disease: Secondary | ICD-10-CM | POA: Diagnosis not present

## 2018-07-29 DIAGNOSIS — Z992 Dependence on renal dialysis: Secondary | ICD-10-CM | POA: Diagnosis not present

## 2018-07-29 DIAGNOSIS — N2581 Secondary hyperparathyroidism of renal origin: Secondary | ICD-10-CM | POA: Diagnosis not present

## 2018-07-29 DIAGNOSIS — D631 Anemia in chronic kidney disease: Secondary | ICD-10-CM | POA: Diagnosis not present

## 2018-07-29 DIAGNOSIS — N186 End stage renal disease: Secondary | ICD-10-CM | POA: Diagnosis not present

## 2018-07-29 DIAGNOSIS — D509 Iron deficiency anemia, unspecified: Secondary | ICD-10-CM | POA: Diagnosis not present

## 2018-07-31 DIAGNOSIS — D509 Iron deficiency anemia, unspecified: Secondary | ICD-10-CM | POA: Diagnosis not present

## 2018-07-31 DIAGNOSIS — D631 Anemia in chronic kidney disease: Secondary | ICD-10-CM | POA: Diagnosis not present

## 2018-07-31 DIAGNOSIS — Z992 Dependence on renal dialysis: Secondary | ICD-10-CM | POA: Diagnosis not present

## 2018-07-31 DIAGNOSIS — N2581 Secondary hyperparathyroidism of renal origin: Secondary | ICD-10-CM | POA: Diagnosis not present

## 2018-07-31 DIAGNOSIS — N186 End stage renal disease: Secondary | ICD-10-CM | POA: Diagnosis not present

## 2018-08-02 DIAGNOSIS — N186 End stage renal disease: Secondary | ICD-10-CM | POA: Diagnosis not present

## 2018-08-02 DIAGNOSIS — N2581 Secondary hyperparathyroidism of renal origin: Secondary | ICD-10-CM | POA: Diagnosis not present

## 2018-08-02 DIAGNOSIS — D509 Iron deficiency anemia, unspecified: Secondary | ICD-10-CM | POA: Diagnosis not present

## 2018-08-02 DIAGNOSIS — D631 Anemia in chronic kidney disease: Secondary | ICD-10-CM | POA: Diagnosis not present

## 2018-08-02 DIAGNOSIS — Z992 Dependence on renal dialysis: Secondary | ICD-10-CM | POA: Diagnosis not present

## 2018-08-04 DIAGNOSIS — D631 Anemia in chronic kidney disease: Secondary | ICD-10-CM | POA: Diagnosis not present

## 2018-08-04 DIAGNOSIS — D509 Iron deficiency anemia, unspecified: Secondary | ICD-10-CM | POA: Diagnosis not present

## 2018-08-04 DIAGNOSIS — N2581 Secondary hyperparathyroidism of renal origin: Secondary | ICD-10-CM | POA: Diagnosis not present

## 2018-08-04 DIAGNOSIS — Z992 Dependence on renal dialysis: Secondary | ICD-10-CM | POA: Diagnosis not present

## 2018-08-04 DIAGNOSIS — N186 End stage renal disease: Secondary | ICD-10-CM | POA: Diagnosis not present

## 2018-08-07 DIAGNOSIS — N2581 Secondary hyperparathyroidism of renal origin: Secondary | ICD-10-CM | POA: Diagnosis not present

## 2018-08-07 DIAGNOSIS — Z794 Long term (current) use of insulin: Secondary | ICD-10-CM | POA: Diagnosis not present

## 2018-08-07 DIAGNOSIS — D631 Anemia in chronic kidney disease: Secondary | ICD-10-CM | POA: Diagnosis not present

## 2018-08-07 DIAGNOSIS — Z992 Dependence on renal dialysis: Secondary | ICD-10-CM | POA: Diagnosis not present

## 2018-08-07 DIAGNOSIS — N186 End stage renal disease: Secondary | ICD-10-CM | POA: Diagnosis not present

## 2018-08-07 DIAGNOSIS — E119 Type 2 diabetes mellitus without complications: Secondary | ICD-10-CM | POA: Diagnosis not present

## 2018-08-07 DIAGNOSIS — D509 Iron deficiency anemia, unspecified: Secondary | ICD-10-CM | POA: Diagnosis not present

## 2018-08-09 DIAGNOSIS — N186 End stage renal disease: Secondary | ICD-10-CM | POA: Diagnosis not present

## 2018-08-09 DIAGNOSIS — N2581 Secondary hyperparathyroidism of renal origin: Secondary | ICD-10-CM | POA: Diagnosis not present

## 2018-08-09 DIAGNOSIS — D631 Anemia in chronic kidney disease: Secondary | ICD-10-CM | POA: Diagnosis not present

## 2018-08-09 DIAGNOSIS — Z992 Dependence on renal dialysis: Secondary | ICD-10-CM | POA: Diagnosis not present

## 2018-08-09 DIAGNOSIS — D509 Iron deficiency anemia, unspecified: Secondary | ICD-10-CM | POA: Diagnosis not present

## 2018-08-12 DIAGNOSIS — D509 Iron deficiency anemia, unspecified: Secondary | ICD-10-CM | POA: Diagnosis not present

## 2018-08-12 DIAGNOSIS — N186 End stage renal disease: Secondary | ICD-10-CM | POA: Diagnosis not present

## 2018-08-12 DIAGNOSIS — Z992 Dependence on renal dialysis: Secondary | ICD-10-CM | POA: Diagnosis not present

## 2018-08-12 DIAGNOSIS — D631 Anemia in chronic kidney disease: Secondary | ICD-10-CM | POA: Diagnosis not present

## 2018-08-12 DIAGNOSIS — N2581 Secondary hyperparathyroidism of renal origin: Secondary | ICD-10-CM | POA: Diagnosis not present

## 2018-08-13 DIAGNOSIS — N186 End stage renal disease: Secondary | ICD-10-CM | POA: Diagnosis not present

## 2018-08-13 DIAGNOSIS — D631 Anemia in chronic kidney disease: Secondary | ICD-10-CM | POA: Diagnosis not present

## 2018-08-13 DIAGNOSIS — Z992 Dependence on renal dialysis: Secondary | ICD-10-CM | POA: Diagnosis not present

## 2018-08-13 DIAGNOSIS — N2581 Secondary hyperparathyroidism of renal origin: Secondary | ICD-10-CM | POA: Diagnosis not present

## 2018-08-13 DIAGNOSIS — D509 Iron deficiency anemia, unspecified: Secondary | ICD-10-CM | POA: Diagnosis not present

## 2018-08-14 DIAGNOSIS — Z992 Dependence on renal dialysis: Secondary | ICD-10-CM | POA: Diagnosis not present

## 2018-08-14 DIAGNOSIS — D631 Anemia in chronic kidney disease: Secondary | ICD-10-CM | POA: Diagnosis not present

## 2018-08-14 DIAGNOSIS — N2581 Secondary hyperparathyroidism of renal origin: Secondary | ICD-10-CM | POA: Diagnosis not present

## 2018-08-14 DIAGNOSIS — N186 End stage renal disease: Secondary | ICD-10-CM | POA: Diagnosis not present

## 2018-08-14 DIAGNOSIS — D509 Iron deficiency anemia, unspecified: Secondary | ICD-10-CM | POA: Diagnosis not present

## 2018-08-16 DIAGNOSIS — D509 Iron deficiency anemia, unspecified: Secondary | ICD-10-CM | POA: Diagnosis not present

## 2018-08-16 DIAGNOSIS — D631 Anemia in chronic kidney disease: Secondary | ICD-10-CM | POA: Diagnosis not present

## 2018-08-16 DIAGNOSIS — Z992 Dependence on renal dialysis: Secondary | ICD-10-CM | POA: Diagnosis not present

## 2018-08-16 DIAGNOSIS — N2581 Secondary hyperparathyroidism of renal origin: Secondary | ICD-10-CM | POA: Diagnosis not present

## 2018-08-16 DIAGNOSIS — N186 End stage renal disease: Secondary | ICD-10-CM | POA: Diagnosis not present

## 2018-08-19 DIAGNOSIS — N186 End stage renal disease: Secondary | ICD-10-CM | POA: Diagnosis not present

## 2018-08-19 DIAGNOSIS — N2581 Secondary hyperparathyroidism of renal origin: Secondary | ICD-10-CM | POA: Diagnosis not present

## 2018-08-19 DIAGNOSIS — D631 Anemia in chronic kidney disease: Secondary | ICD-10-CM | POA: Diagnosis not present

## 2018-08-19 DIAGNOSIS — Z992 Dependence on renal dialysis: Secondary | ICD-10-CM | POA: Diagnosis not present

## 2018-08-19 DIAGNOSIS — D509 Iron deficiency anemia, unspecified: Secondary | ICD-10-CM | POA: Diagnosis not present

## 2018-08-20 DIAGNOSIS — E1142 Type 2 diabetes mellitus with diabetic polyneuropathy: Secondary | ICD-10-CM | POA: Diagnosis not present

## 2018-08-20 DIAGNOSIS — E1143 Type 2 diabetes mellitus with diabetic autonomic (poly)neuropathy: Secondary | ICD-10-CM | POA: Diagnosis not present

## 2018-08-20 DIAGNOSIS — I10 Essential (primary) hypertension: Secondary | ICD-10-CM | POA: Diagnosis not present

## 2018-08-20 DIAGNOSIS — E039 Hypothyroidism, unspecified: Secondary | ICD-10-CM | POA: Diagnosis not present

## 2018-08-20 DIAGNOSIS — E1122 Type 2 diabetes mellitus with diabetic chronic kidney disease: Secondary | ICD-10-CM | POA: Diagnosis not present

## 2018-08-20 DIAGNOSIS — K210 Gastro-esophageal reflux disease with esophagitis: Secondary | ICD-10-CM | POA: Diagnosis not present

## 2018-08-20 DIAGNOSIS — J449 Chronic obstructive pulmonary disease, unspecified: Secondary | ICD-10-CM | POA: Diagnosis not present

## 2018-08-20 DIAGNOSIS — E11621 Type 2 diabetes mellitus with foot ulcer: Secondary | ICD-10-CM | POA: Diagnosis not present

## 2018-08-20 DIAGNOSIS — N185 Chronic kidney disease, stage 5: Secondary | ICD-10-CM | POA: Diagnosis not present

## 2018-08-21 DIAGNOSIS — Z992 Dependence on renal dialysis: Secondary | ICD-10-CM | POA: Diagnosis not present

## 2018-08-21 DIAGNOSIS — N186 End stage renal disease: Secondary | ICD-10-CM | POA: Diagnosis not present

## 2018-08-21 DIAGNOSIS — N2581 Secondary hyperparathyroidism of renal origin: Secondary | ICD-10-CM | POA: Diagnosis not present

## 2018-08-21 DIAGNOSIS — D631 Anemia in chronic kidney disease: Secondary | ICD-10-CM | POA: Diagnosis not present

## 2018-08-21 DIAGNOSIS — D509 Iron deficiency anemia, unspecified: Secondary | ICD-10-CM | POA: Diagnosis not present

## 2018-08-23 DIAGNOSIS — Z992 Dependence on renal dialysis: Secondary | ICD-10-CM | POA: Diagnosis not present

## 2018-08-23 DIAGNOSIS — N2581 Secondary hyperparathyroidism of renal origin: Secondary | ICD-10-CM | POA: Diagnosis not present

## 2018-08-23 DIAGNOSIS — D631 Anemia in chronic kidney disease: Secondary | ICD-10-CM | POA: Diagnosis not present

## 2018-08-23 DIAGNOSIS — N186 End stage renal disease: Secondary | ICD-10-CM | POA: Diagnosis not present

## 2018-08-23 DIAGNOSIS — D509 Iron deficiency anemia, unspecified: Secondary | ICD-10-CM | POA: Diagnosis not present

## 2018-08-26 DIAGNOSIS — D631 Anemia in chronic kidney disease: Secondary | ICD-10-CM | POA: Diagnosis not present

## 2018-08-26 DIAGNOSIS — D509 Iron deficiency anemia, unspecified: Secondary | ICD-10-CM | POA: Diagnosis not present

## 2018-08-26 DIAGNOSIS — N186 End stage renal disease: Secondary | ICD-10-CM | POA: Diagnosis not present

## 2018-08-26 DIAGNOSIS — N2581 Secondary hyperparathyroidism of renal origin: Secondary | ICD-10-CM | POA: Diagnosis not present

## 2018-08-26 DIAGNOSIS — Z992 Dependence on renal dialysis: Secondary | ICD-10-CM | POA: Diagnosis not present

## 2018-08-27 DIAGNOSIS — I10 Essential (primary) hypertension: Secondary | ICD-10-CM | POA: Diagnosis not present

## 2018-08-27 DIAGNOSIS — Z0001 Encounter for general adult medical examination with abnormal findings: Secondary | ICD-10-CM | POA: Diagnosis not present

## 2018-08-27 DIAGNOSIS — Z6832 Body mass index (BMI) 32.0-32.9, adult: Secondary | ICD-10-CM | POA: Diagnosis not present

## 2018-08-28 DIAGNOSIS — D631 Anemia in chronic kidney disease: Secondary | ICD-10-CM | POA: Diagnosis not present

## 2018-08-28 DIAGNOSIS — Z992 Dependence on renal dialysis: Secondary | ICD-10-CM | POA: Diagnosis not present

## 2018-08-28 DIAGNOSIS — D509 Iron deficiency anemia, unspecified: Secondary | ICD-10-CM | POA: Diagnosis not present

## 2018-08-28 DIAGNOSIS — N2581 Secondary hyperparathyroidism of renal origin: Secondary | ICD-10-CM | POA: Diagnosis not present

## 2018-08-28 DIAGNOSIS — N186 End stage renal disease: Secondary | ICD-10-CM | POA: Diagnosis not present

## 2018-08-30 DIAGNOSIS — D631 Anemia in chronic kidney disease: Secondary | ICD-10-CM | POA: Diagnosis not present

## 2018-08-30 DIAGNOSIS — N186 End stage renal disease: Secondary | ICD-10-CM | POA: Diagnosis not present

## 2018-08-30 DIAGNOSIS — N2581 Secondary hyperparathyroidism of renal origin: Secondary | ICD-10-CM | POA: Diagnosis not present

## 2018-08-30 DIAGNOSIS — D509 Iron deficiency anemia, unspecified: Secondary | ICD-10-CM | POA: Diagnosis not present

## 2018-08-30 DIAGNOSIS — Z992 Dependence on renal dialysis: Secondary | ICD-10-CM | POA: Diagnosis not present

## 2018-09-01 DIAGNOSIS — I080 Rheumatic disorders of both mitral and aortic valves: Secondary | ICD-10-CM | POA: Diagnosis not present

## 2018-09-02 DIAGNOSIS — I350 Nonrheumatic aortic (valve) stenosis: Secondary | ICD-10-CM | POA: Diagnosis not present

## 2018-09-02 DIAGNOSIS — I517 Cardiomegaly: Secondary | ICD-10-CM | POA: Diagnosis not present

## 2018-09-02 DIAGNOSIS — N186 End stage renal disease: Secondary | ICD-10-CM | POA: Diagnosis not present

## 2018-09-02 DIAGNOSIS — N2581 Secondary hyperparathyroidism of renal origin: Secondary | ICD-10-CM | POA: Diagnosis not present

## 2018-09-02 DIAGNOSIS — D509 Iron deficiency anemia, unspecified: Secondary | ICD-10-CM | POA: Diagnosis not present

## 2018-09-02 DIAGNOSIS — Z992 Dependence on renal dialysis: Secondary | ICD-10-CM | POA: Diagnosis not present

## 2018-09-02 DIAGNOSIS — D631 Anemia in chronic kidney disease: Secondary | ICD-10-CM | POA: Diagnosis not present

## 2018-09-02 DIAGNOSIS — I348 Other nonrheumatic mitral valve disorders: Secondary | ICD-10-CM | POA: Diagnosis not present

## 2018-09-04 DIAGNOSIS — D631 Anemia in chronic kidney disease: Secondary | ICD-10-CM | POA: Diagnosis not present

## 2018-09-04 DIAGNOSIS — N186 End stage renal disease: Secondary | ICD-10-CM | POA: Diagnosis not present

## 2018-09-04 DIAGNOSIS — D509 Iron deficiency anemia, unspecified: Secondary | ICD-10-CM | POA: Diagnosis not present

## 2018-09-04 DIAGNOSIS — N2581 Secondary hyperparathyroidism of renal origin: Secondary | ICD-10-CM | POA: Diagnosis not present

## 2018-09-04 DIAGNOSIS — Z992 Dependence on renal dialysis: Secondary | ICD-10-CM | POA: Diagnosis not present

## 2018-09-06 DIAGNOSIS — N2581 Secondary hyperparathyroidism of renal origin: Secondary | ICD-10-CM | POA: Diagnosis not present

## 2018-09-06 DIAGNOSIS — D509 Iron deficiency anemia, unspecified: Secondary | ICD-10-CM | POA: Diagnosis not present

## 2018-09-06 DIAGNOSIS — N186 End stage renal disease: Secondary | ICD-10-CM | POA: Diagnosis not present

## 2018-09-06 DIAGNOSIS — Z992 Dependence on renal dialysis: Secondary | ICD-10-CM | POA: Diagnosis not present

## 2018-09-06 DIAGNOSIS — D631 Anemia in chronic kidney disease: Secondary | ICD-10-CM | POA: Diagnosis not present

## 2018-09-09 DIAGNOSIS — D509 Iron deficiency anemia, unspecified: Secondary | ICD-10-CM | POA: Diagnosis not present

## 2018-09-09 DIAGNOSIS — N186 End stage renal disease: Secondary | ICD-10-CM | POA: Diagnosis not present

## 2018-09-09 DIAGNOSIS — N2581 Secondary hyperparathyroidism of renal origin: Secondary | ICD-10-CM | POA: Diagnosis not present

## 2018-09-09 DIAGNOSIS — Z992 Dependence on renal dialysis: Secondary | ICD-10-CM | POA: Diagnosis not present

## 2018-09-09 DIAGNOSIS — D631 Anemia in chronic kidney disease: Secondary | ICD-10-CM | POA: Diagnosis not present

## 2018-09-10 DIAGNOSIS — L03116 Cellulitis of left lower limb: Secondary | ICD-10-CM | POA: Insufficient documentation

## 2018-09-10 DIAGNOSIS — Z6833 Body mass index (BMI) 33.0-33.9, adult: Secondary | ICD-10-CM | POA: Diagnosis not present

## 2018-09-11 DIAGNOSIS — J449 Chronic obstructive pulmonary disease, unspecified: Secondary | ICD-10-CM | POA: Diagnosis not present

## 2018-09-11 DIAGNOSIS — D631 Anemia in chronic kidney disease: Secondary | ICD-10-CM | POA: Diagnosis not present

## 2018-09-11 DIAGNOSIS — Z992 Dependence on renal dialysis: Secondary | ICD-10-CM | POA: Diagnosis not present

## 2018-09-11 DIAGNOSIS — N2581 Secondary hyperparathyroidism of renal origin: Secondary | ICD-10-CM | POA: Diagnosis not present

## 2018-09-11 DIAGNOSIS — D509 Iron deficiency anemia, unspecified: Secondary | ICD-10-CM | POA: Diagnosis not present

## 2018-09-11 DIAGNOSIS — I10 Essential (primary) hypertension: Secondary | ICD-10-CM | POA: Diagnosis not present

## 2018-09-11 DIAGNOSIS — E782 Mixed hyperlipidemia: Secondary | ICD-10-CM | POA: Diagnosis not present

## 2018-09-11 DIAGNOSIS — N186 End stage renal disease: Secondary | ICD-10-CM | POA: Diagnosis not present

## 2018-09-12 DIAGNOSIS — N186 End stage renal disease: Secondary | ICD-10-CM | POA: Diagnosis not present

## 2018-09-12 DIAGNOSIS — Z992 Dependence on renal dialysis: Secondary | ICD-10-CM | POA: Diagnosis not present

## 2018-09-13 DIAGNOSIS — N186 End stage renal disease: Secondary | ICD-10-CM | POA: Diagnosis not present

## 2018-09-13 DIAGNOSIS — D509 Iron deficiency anemia, unspecified: Secondary | ICD-10-CM | POA: Diagnosis not present

## 2018-09-13 DIAGNOSIS — N2581 Secondary hyperparathyroidism of renal origin: Secondary | ICD-10-CM | POA: Diagnosis not present

## 2018-09-13 DIAGNOSIS — D631 Anemia in chronic kidney disease: Secondary | ICD-10-CM | POA: Diagnosis not present

## 2018-09-13 DIAGNOSIS — Z992 Dependence on renal dialysis: Secondary | ICD-10-CM | POA: Diagnosis not present

## 2018-09-16 DIAGNOSIS — N186 End stage renal disease: Secondary | ICD-10-CM | POA: Diagnosis not present

## 2018-09-16 DIAGNOSIS — N2581 Secondary hyperparathyroidism of renal origin: Secondary | ICD-10-CM | POA: Diagnosis not present

## 2018-09-16 DIAGNOSIS — D631 Anemia in chronic kidney disease: Secondary | ICD-10-CM | POA: Diagnosis not present

## 2018-09-16 DIAGNOSIS — D509 Iron deficiency anemia, unspecified: Secondary | ICD-10-CM | POA: Diagnosis not present

## 2018-09-16 DIAGNOSIS — Z992 Dependence on renal dialysis: Secondary | ICD-10-CM | POA: Diagnosis not present

## 2018-09-18 DIAGNOSIS — D509 Iron deficiency anemia, unspecified: Secondary | ICD-10-CM | POA: Diagnosis not present

## 2018-09-18 DIAGNOSIS — D631 Anemia in chronic kidney disease: Secondary | ICD-10-CM | POA: Diagnosis not present

## 2018-09-18 DIAGNOSIS — N186 End stage renal disease: Secondary | ICD-10-CM | POA: Diagnosis not present

## 2018-09-18 DIAGNOSIS — Z992 Dependence on renal dialysis: Secondary | ICD-10-CM | POA: Diagnosis not present

## 2018-09-18 DIAGNOSIS — N2581 Secondary hyperparathyroidism of renal origin: Secondary | ICD-10-CM | POA: Diagnosis not present

## 2018-09-20 DIAGNOSIS — D631 Anemia in chronic kidney disease: Secondary | ICD-10-CM | POA: Diagnosis not present

## 2018-09-20 DIAGNOSIS — N2581 Secondary hyperparathyroidism of renal origin: Secondary | ICD-10-CM | POA: Diagnosis not present

## 2018-09-20 DIAGNOSIS — N186 End stage renal disease: Secondary | ICD-10-CM | POA: Diagnosis not present

## 2018-09-20 DIAGNOSIS — D509 Iron deficiency anemia, unspecified: Secondary | ICD-10-CM | POA: Diagnosis not present

## 2018-09-20 DIAGNOSIS — Z992 Dependence on renal dialysis: Secondary | ICD-10-CM | POA: Diagnosis not present

## 2018-09-23 DIAGNOSIS — D509 Iron deficiency anemia, unspecified: Secondary | ICD-10-CM | POA: Diagnosis not present

## 2018-09-23 DIAGNOSIS — D631 Anemia in chronic kidney disease: Secondary | ICD-10-CM | POA: Diagnosis not present

## 2018-09-23 DIAGNOSIS — Z992 Dependence on renal dialysis: Secondary | ICD-10-CM | POA: Diagnosis not present

## 2018-09-23 DIAGNOSIS — N186 End stage renal disease: Secondary | ICD-10-CM | POA: Diagnosis not present

## 2018-09-23 DIAGNOSIS — N2581 Secondary hyperparathyroidism of renal origin: Secondary | ICD-10-CM | POA: Diagnosis not present

## 2018-09-25 DIAGNOSIS — N2581 Secondary hyperparathyroidism of renal origin: Secondary | ICD-10-CM | POA: Diagnosis not present

## 2018-09-25 DIAGNOSIS — N186 End stage renal disease: Secondary | ICD-10-CM | POA: Diagnosis not present

## 2018-09-25 DIAGNOSIS — L110 Acquired keratosis follicularis: Secondary | ICD-10-CM | POA: Diagnosis not present

## 2018-09-25 DIAGNOSIS — L89622 Pressure ulcer of left heel, stage 2: Secondary | ICD-10-CM | POA: Diagnosis not present

## 2018-09-25 DIAGNOSIS — E1140 Type 2 diabetes mellitus with diabetic neuropathy, unspecified: Secondary | ICD-10-CM | POA: Diagnosis not present

## 2018-09-25 DIAGNOSIS — Z992 Dependence on renal dialysis: Secondary | ICD-10-CM | POA: Diagnosis not present

## 2018-09-25 DIAGNOSIS — D509 Iron deficiency anemia, unspecified: Secondary | ICD-10-CM | POA: Diagnosis not present

## 2018-09-25 DIAGNOSIS — D631 Anemia in chronic kidney disease: Secondary | ICD-10-CM | POA: Diagnosis not present

## 2018-09-25 DIAGNOSIS — M79672 Pain in left foot: Secondary | ICD-10-CM | POA: Diagnosis not present

## 2018-09-27 DIAGNOSIS — D509 Iron deficiency anemia, unspecified: Secondary | ICD-10-CM | POA: Diagnosis not present

## 2018-09-27 DIAGNOSIS — D631 Anemia in chronic kidney disease: Secondary | ICD-10-CM | POA: Diagnosis not present

## 2018-09-27 DIAGNOSIS — N2581 Secondary hyperparathyroidism of renal origin: Secondary | ICD-10-CM | POA: Diagnosis not present

## 2018-09-27 DIAGNOSIS — Z992 Dependence on renal dialysis: Secondary | ICD-10-CM | POA: Diagnosis not present

## 2018-09-27 DIAGNOSIS — N186 End stage renal disease: Secondary | ICD-10-CM | POA: Diagnosis not present

## 2018-09-30 DIAGNOSIS — N2581 Secondary hyperparathyroidism of renal origin: Secondary | ICD-10-CM | POA: Diagnosis not present

## 2018-09-30 DIAGNOSIS — D509 Iron deficiency anemia, unspecified: Secondary | ICD-10-CM | POA: Diagnosis not present

## 2018-09-30 DIAGNOSIS — D631 Anemia in chronic kidney disease: Secondary | ICD-10-CM | POA: Diagnosis not present

## 2018-09-30 DIAGNOSIS — Z992 Dependence on renal dialysis: Secondary | ICD-10-CM | POA: Diagnosis not present

## 2018-09-30 DIAGNOSIS — N186 End stage renal disease: Secondary | ICD-10-CM | POA: Diagnosis not present

## 2018-10-02 DIAGNOSIS — D631 Anemia in chronic kidney disease: Secondary | ICD-10-CM | POA: Diagnosis not present

## 2018-10-02 DIAGNOSIS — N2581 Secondary hyperparathyroidism of renal origin: Secondary | ICD-10-CM | POA: Diagnosis not present

## 2018-10-02 DIAGNOSIS — Z992 Dependence on renal dialysis: Secondary | ICD-10-CM | POA: Diagnosis not present

## 2018-10-02 DIAGNOSIS — D509 Iron deficiency anemia, unspecified: Secondary | ICD-10-CM | POA: Diagnosis not present

## 2018-10-02 DIAGNOSIS — N186 End stage renal disease: Secondary | ICD-10-CM | POA: Diagnosis not present

## 2018-10-04 DIAGNOSIS — N2581 Secondary hyperparathyroidism of renal origin: Secondary | ICD-10-CM | POA: Diagnosis not present

## 2018-10-04 DIAGNOSIS — N186 End stage renal disease: Secondary | ICD-10-CM | POA: Diagnosis not present

## 2018-10-04 DIAGNOSIS — D631 Anemia in chronic kidney disease: Secondary | ICD-10-CM | POA: Diagnosis not present

## 2018-10-04 DIAGNOSIS — D509 Iron deficiency anemia, unspecified: Secondary | ICD-10-CM | POA: Diagnosis not present

## 2018-10-04 DIAGNOSIS — Z992 Dependence on renal dialysis: Secondary | ICD-10-CM | POA: Diagnosis not present

## 2018-10-07 DIAGNOSIS — N2581 Secondary hyperparathyroidism of renal origin: Secondary | ICD-10-CM | POA: Diagnosis not present

## 2018-10-07 DIAGNOSIS — Z992 Dependence on renal dialysis: Secondary | ICD-10-CM | POA: Diagnosis not present

## 2018-10-07 DIAGNOSIS — D631 Anemia in chronic kidney disease: Secondary | ICD-10-CM | POA: Diagnosis not present

## 2018-10-07 DIAGNOSIS — N186 End stage renal disease: Secondary | ICD-10-CM | POA: Diagnosis not present

## 2018-10-07 DIAGNOSIS — D509 Iron deficiency anemia, unspecified: Secondary | ICD-10-CM | POA: Diagnosis not present

## 2018-10-09 DIAGNOSIS — N2581 Secondary hyperparathyroidism of renal origin: Secondary | ICD-10-CM | POA: Diagnosis not present

## 2018-10-09 DIAGNOSIS — D509 Iron deficiency anemia, unspecified: Secondary | ICD-10-CM | POA: Diagnosis not present

## 2018-10-09 DIAGNOSIS — E1151 Type 2 diabetes mellitus with diabetic peripheral angiopathy without gangrene: Secondary | ICD-10-CM | POA: Diagnosis not present

## 2018-10-09 DIAGNOSIS — D631 Anemia in chronic kidney disease: Secondary | ICD-10-CM | POA: Diagnosis not present

## 2018-10-09 DIAGNOSIS — L89892 Pressure ulcer of other site, stage 2: Secondary | ICD-10-CM | POA: Diagnosis not present

## 2018-10-09 DIAGNOSIS — N186 End stage renal disease: Secondary | ICD-10-CM | POA: Diagnosis not present

## 2018-10-09 DIAGNOSIS — Z992 Dependence on renal dialysis: Secondary | ICD-10-CM | POA: Diagnosis not present

## 2018-10-10 DIAGNOSIS — I10 Essential (primary) hypertension: Secondary | ICD-10-CM | POA: Diagnosis not present

## 2018-10-10 DIAGNOSIS — E782 Mixed hyperlipidemia: Secondary | ICD-10-CM | POA: Diagnosis not present

## 2018-10-11 DIAGNOSIS — N186 End stage renal disease: Secondary | ICD-10-CM | POA: Diagnosis not present

## 2018-10-11 DIAGNOSIS — D509 Iron deficiency anemia, unspecified: Secondary | ICD-10-CM | POA: Diagnosis not present

## 2018-10-11 DIAGNOSIS — N2581 Secondary hyperparathyroidism of renal origin: Secondary | ICD-10-CM | POA: Diagnosis not present

## 2018-10-11 DIAGNOSIS — Z992 Dependence on renal dialysis: Secondary | ICD-10-CM | POA: Diagnosis not present

## 2018-10-11 DIAGNOSIS — D631 Anemia in chronic kidney disease: Secondary | ICD-10-CM | POA: Diagnosis not present

## 2018-10-12 DIAGNOSIS — N2581 Secondary hyperparathyroidism of renal origin: Secondary | ICD-10-CM | POA: Diagnosis not present

## 2018-10-12 DIAGNOSIS — Z992 Dependence on renal dialysis: Secondary | ICD-10-CM | POA: Diagnosis not present

## 2018-10-12 DIAGNOSIS — N186 End stage renal disease: Secondary | ICD-10-CM | POA: Diagnosis not present

## 2018-10-12 DIAGNOSIS — D631 Anemia in chronic kidney disease: Secondary | ICD-10-CM | POA: Diagnosis not present

## 2018-10-12 DIAGNOSIS — D509 Iron deficiency anemia, unspecified: Secondary | ICD-10-CM | POA: Diagnosis not present

## 2018-10-14 DIAGNOSIS — Z992 Dependence on renal dialysis: Secondary | ICD-10-CM | POA: Diagnosis not present

## 2018-10-14 DIAGNOSIS — D631 Anemia in chronic kidney disease: Secondary | ICD-10-CM | POA: Diagnosis not present

## 2018-10-14 DIAGNOSIS — D509 Iron deficiency anemia, unspecified: Secondary | ICD-10-CM | POA: Diagnosis not present

## 2018-10-14 DIAGNOSIS — N2581 Secondary hyperparathyroidism of renal origin: Secondary | ICD-10-CM | POA: Diagnosis not present

## 2018-10-14 DIAGNOSIS — N186 End stage renal disease: Secondary | ICD-10-CM | POA: Diagnosis not present

## 2018-10-16 DIAGNOSIS — N2581 Secondary hyperparathyroidism of renal origin: Secondary | ICD-10-CM | POA: Diagnosis not present

## 2018-10-16 DIAGNOSIS — D509 Iron deficiency anemia, unspecified: Secondary | ICD-10-CM | POA: Diagnosis not present

## 2018-10-16 DIAGNOSIS — N186 End stage renal disease: Secondary | ICD-10-CM | POA: Diagnosis not present

## 2018-10-16 DIAGNOSIS — D631 Anemia in chronic kidney disease: Secondary | ICD-10-CM | POA: Diagnosis not present

## 2018-10-16 DIAGNOSIS — Z992 Dependence on renal dialysis: Secondary | ICD-10-CM | POA: Diagnosis not present

## 2018-10-18 DIAGNOSIS — N2581 Secondary hyperparathyroidism of renal origin: Secondary | ICD-10-CM | POA: Diagnosis not present

## 2018-10-18 DIAGNOSIS — Z992 Dependence on renal dialysis: Secondary | ICD-10-CM | POA: Diagnosis not present

## 2018-10-18 DIAGNOSIS — D631 Anemia in chronic kidney disease: Secondary | ICD-10-CM | POA: Diagnosis not present

## 2018-10-18 DIAGNOSIS — N186 End stage renal disease: Secondary | ICD-10-CM | POA: Diagnosis not present

## 2018-10-18 DIAGNOSIS — D509 Iron deficiency anemia, unspecified: Secondary | ICD-10-CM | POA: Diagnosis not present

## 2018-10-21 DIAGNOSIS — Z992 Dependence on renal dialysis: Secondary | ICD-10-CM | POA: Diagnosis not present

## 2018-10-21 DIAGNOSIS — N2581 Secondary hyperparathyroidism of renal origin: Secondary | ICD-10-CM | POA: Diagnosis not present

## 2018-10-21 DIAGNOSIS — D509 Iron deficiency anemia, unspecified: Secondary | ICD-10-CM | POA: Diagnosis not present

## 2018-10-21 DIAGNOSIS — D631 Anemia in chronic kidney disease: Secondary | ICD-10-CM | POA: Diagnosis not present

## 2018-10-21 DIAGNOSIS — N186 End stage renal disease: Secondary | ICD-10-CM | POA: Diagnosis not present

## 2018-10-23 DIAGNOSIS — D509 Iron deficiency anemia, unspecified: Secondary | ICD-10-CM | POA: Diagnosis not present

## 2018-10-23 DIAGNOSIS — L89892 Pressure ulcer of other site, stage 2: Secondary | ICD-10-CM | POA: Diagnosis not present

## 2018-10-23 DIAGNOSIS — Z992 Dependence on renal dialysis: Secondary | ICD-10-CM | POA: Diagnosis not present

## 2018-10-23 DIAGNOSIS — N2581 Secondary hyperparathyroidism of renal origin: Secondary | ICD-10-CM | POA: Diagnosis not present

## 2018-10-23 DIAGNOSIS — N186 End stage renal disease: Secondary | ICD-10-CM | POA: Diagnosis not present

## 2018-10-23 DIAGNOSIS — D631 Anemia in chronic kidney disease: Secondary | ICD-10-CM | POA: Diagnosis not present

## 2018-10-23 DIAGNOSIS — E1151 Type 2 diabetes mellitus with diabetic peripheral angiopathy without gangrene: Secondary | ICD-10-CM | POA: Diagnosis not present

## 2018-10-23 DIAGNOSIS — E1140 Type 2 diabetes mellitus with diabetic neuropathy, unspecified: Secondary | ICD-10-CM | POA: Diagnosis not present

## 2018-10-25 DIAGNOSIS — Z992 Dependence on renal dialysis: Secondary | ICD-10-CM | POA: Diagnosis not present

## 2018-10-25 DIAGNOSIS — D509 Iron deficiency anemia, unspecified: Secondary | ICD-10-CM | POA: Diagnosis not present

## 2018-10-25 DIAGNOSIS — N186 End stage renal disease: Secondary | ICD-10-CM | POA: Diagnosis not present

## 2018-10-25 DIAGNOSIS — N2581 Secondary hyperparathyroidism of renal origin: Secondary | ICD-10-CM | POA: Diagnosis not present

## 2018-10-25 DIAGNOSIS — D631 Anemia in chronic kidney disease: Secondary | ICD-10-CM | POA: Diagnosis not present

## 2018-10-28 DIAGNOSIS — N2581 Secondary hyperparathyroidism of renal origin: Secondary | ICD-10-CM | POA: Diagnosis not present

## 2018-10-28 DIAGNOSIS — N186 End stage renal disease: Secondary | ICD-10-CM | POA: Diagnosis not present

## 2018-10-28 DIAGNOSIS — D509 Iron deficiency anemia, unspecified: Secondary | ICD-10-CM | POA: Diagnosis not present

## 2018-10-28 DIAGNOSIS — Z992 Dependence on renal dialysis: Secondary | ICD-10-CM | POA: Diagnosis not present

## 2018-10-28 DIAGNOSIS — D631 Anemia in chronic kidney disease: Secondary | ICD-10-CM | POA: Diagnosis not present

## 2018-10-30 DIAGNOSIS — N2581 Secondary hyperparathyroidism of renal origin: Secondary | ICD-10-CM | POA: Diagnosis not present

## 2018-10-30 DIAGNOSIS — Z992 Dependence on renal dialysis: Secondary | ICD-10-CM | POA: Diagnosis not present

## 2018-10-30 DIAGNOSIS — D509 Iron deficiency anemia, unspecified: Secondary | ICD-10-CM | POA: Diagnosis not present

## 2018-10-30 DIAGNOSIS — D631 Anemia in chronic kidney disease: Secondary | ICD-10-CM | POA: Diagnosis not present

## 2018-10-30 DIAGNOSIS — N186 End stage renal disease: Secondary | ICD-10-CM | POA: Diagnosis not present

## 2018-11-01 DIAGNOSIS — Z992 Dependence on renal dialysis: Secondary | ICD-10-CM | POA: Diagnosis not present

## 2018-11-01 DIAGNOSIS — N186 End stage renal disease: Secondary | ICD-10-CM | POA: Diagnosis not present

## 2018-11-01 DIAGNOSIS — D509 Iron deficiency anemia, unspecified: Secondary | ICD-10-CM | POA: Diagnosis not present

## 2018-11-01 DIAGNOSIS — N2581 Secondary hyperparathyroidism of renal origin: Secondary | ICD-10-CM | POA: Diagnosis not present

## 2018-11-01 DIAGNOSIS — D631 Anemia in chronic kidney disease: Secondary | ICD-10-CM | POA: Diagnosis not present

## 2018-11-04 DIAGNOSIS — Z992 Dependence on renal dialysis: Secondary | ICD-10-CM | POA: Diagnosis not present

## 2018-11-04 DIAGNOSIS — N186 End stage renal disease: Secondary | ICD-10-CM | POA: Diagnosis not present

## 2018-11-04 DIAGNOSIS — D509 Iron deficiency anemia, unspecified: Secondary | ICD-10-CM | POA: Diagnosis not present

## 2018-11-04 DIAGNOSIS — Z794 Long term (current) use of insulin: Secondary | ICD-10-CM | POA: Diagnosis not present

## 2018-11-04 DIAGNOSIS — N2581 Secondary hyperparathyroidism of renal origin: Secondary | ICD-10-CM | POA: Diagnosis not present

## 2018-11-04 DIAGNOSIS — D631 Anemia in chronic kidney disease: Secondary | ICD-10-CM | POA: Diagnosis not present

## 2018-11-04 DIAGNOSIS — E119 Type 2 diabetes mellitus without complications: Secondary | ICD-10-CM | POA: Diagnosis not present

## 2018-11-06 DIAGNOSIS — N2581 Secondary hyperparathyroidism of renal origin: Secondary | ICD-10-CM | POA: Diagnosis not present

## 2018-11-06 DIAGNOSIS — N186 End stage renal disease: Secondary | ICD-10-CM | POA: Diagnosis not present

## 2018-11-06 DIAGNOSIS — D509 Iron deficiency anemia, unspecified: Secondary | ICD-10-CM | POA: Diagnosis not present

## 2018-11-06 DIAGNOSIS — Z992 Dependence on renal dialysis: Secondary | ICD-10-CM | POA: Diagnosis not present

## 2018-11-06 DIAGNOSIS — D631 Anemia in chronic kidney disease: Secondary | ICD-10-CM | POA: Diagnosis not present

## 2018-11-08 DIAGNOSIS — D631 Anemia in chronic kidney disease: Secondary | ICD-10-CM | POA: Diagnosis not present

## 2018-11-08 DIAGNOSIS — N2581 Secondary hyperparathyroidism of renal origin: Secondary | ICD-10-CM | POA: Diagnosis not present

## 2018-11-08 DIAGNOSIS — D509 Iron deficiency anemia, unspecified: Secondary | ICD-10-CM | POA: Diagnosis not present

## 2018-11-08 DIAGNOSIS — N186 End stage renal disease: Secondary | ICD-10-CM | POA: Diagnosis not present

## 2018-11-08 DIAGNOSIS — Z992 Dependence on renal dialysis: Secondary | ICD-10-CM | POA: Diagnosis not present

## 2018-11-10 DIAGNOSIS — I10 Essential (primary) hypertension: Secondary | ICD-10-CM | POA: Diagnosis not present

## 2018-11-10 DIAGNOSIS — L89892 Pressure ulcer of other site, stage 2: Secondary | ICD-10-CM | POA: Diagnosis not present

## 2018-11-10 DIAGNOSIS — E1151 Type 2 diabetes mellitus with diabetic peripheral angiopathy without gangrene: Secondary | ICD-10-CM | POA: Diagnosis not present

## 2018-11-10 DIAGNOSIS — E782 Mixed hyperlipidemia: Secondary | ICD-10-CM | POA: Diagnosis not present

## 2018-11-10 DIAGNOSIS — E1140 Type 2 diabetes mellitus with diabetic neuropathy, unspecified: Secondary | ICD-10-CM | POA: Diagnosis not present

## 2018-11-11 DIAGNOSIS — Z992 Dependence on renal dialysis: Secondary | ICD-10-CM | POA: Diagnosis not present

## 2018-11-11 DIAGNOSIS — N2581 Secondary hyperparathyroidism of renal origin: Secondary | ICD-10-CM | POA: Diagnosis not present

## 2018-11-11 DIAGNOSIS — N186 End stage renal disease: Secondary | ICD-10-CM | POA: Diagnosis not present

## 2018-11-11 DIAGNOSIS — D631 Anemia in chronic kidney disease: Secondary | ICD-10-CM | POA: Diagnosis not present

## 2018-11-11 DIAGNOSIS — D509 Iron deficiency anemia, unspecified: Secondary | ICD-10-CM | POA: Diagnosis not present

## 2018-11-12 DIAGNOSIS — N186 End stage renal disease: Secondary | ICD-10-CM | POA: Diagnosis not present

## 2018-11-12 DIAGNOSIS — D509 Iron deficiency anemia, unspecified: Secondary | ICD-10-CM | POA: Diagnosis not present

## 2018-11-12 DIAGNOSIS — N2581 Secondary hyperparathyroidism of renal origin: Secondary | ICD-10-CM | POA: Diagnosis not present

## 2018-11-12 DIAGNOSIS — Z992 Dependence on renal dialysis: Secondary | ICD-10-CM | POA: Diagnosis not present

## 2018-11-12 DIAGNOSIS — D631 Anemia in chronic kidney disease: Secondary | ICD-10-CM | POA: Diagnosis not present

## 2018-11-13 DIAGNOSIS — D631 Anemia in chronic kidney disease: Secondary | ICD-10-CM | POA: Diagnosis not present

## 2018-11-13 DIAGNOSIS — N186 End stage renal disease: Secondary | ICD-10-CM | POA: Diagnosis not present

## 2018-11-13 DIAGNOSIS — Z992 Dependence on renal dialysis: Secondary | ICD-10-CM | POA: Diagnosis not present

## 2018-11-13 DIAGNOSIS — N2581 Secondary hyperparathyroidism of renal origin: Secondary | ICD-10-CM | POA: Diagnosis not present

## 2018-11-13 DIAGNOSIS — D509 Iron deficiency anemia, unspecified: Secondary | ICD-10-CM | POA: Diagnosis not present

## 2018-11-15 DIAGNOSIS — D509 Iron deficiency anemia, unspecified: Secondary | ICD-10-CM | POA: Diagnosis not present

## 2018-11-15 DIAGNOSIS — N2581 Secondary hyperparathyroidism of renal origin: Secondary | ICD-10-CM | POA: Diagnosis not present

## 2018-11-15 DIAGNOSIS — N186 End stage renal disease: Secondary | ICD-10-CM | POA: Diagnosis not present

## 2018-11-15 DIAGNOSIS — Z992 Dependence on renal dialysis: Secondary | ICD-10-CM | POA: Diagnosis not present

## 2018-11-15 DIAGNOSIS — D631 Anemia in chronic kidney disease: Secondary | ICD-10-CM | POA: Diagnosis not present

## 2018-11-18 DIAGNOSIS — N186 End stage renal disease: Secondary | ICD-10-CM | POA: Diagnosis not present

## 2018-11-18 DIAGNOSIS — D631 Anemia in chronic kidney disease: Secondary | ICD-10-CM | POA: Diagnosis not present

## 2018-11-18 DIAGNOSIS — Z992 Dependence on renal dialysis: Secondary | ICD-10-CM | POA: Diagnosis not present

## 2018-11-18 DIAGNOSIS — D509 Iron deficiency anemia, unspecified: Secondary | ICD-10-CM | POA: Diagnosis not present

## 2018-11-18 DIAGNOSIS — N2581 Secondary hyperparathyroidism of renal origin: Secondary | ICD-10-CM | POA: Diagnosis not present

## 2018-11-20 DIAGNOSIS — D509 Iron deficiency anemia, unspecified: Secondary | ICD-10-CM | POA: Diagnosis not present

## 2018-11-20 DIAGNOSIS — Z992 Dependence on renal dialysis: Secondary | ICD-10-CM | POA: Diagnosis not present

## 2018-11-20 DIAGNOSIS — N186 End stage renal disease: Secondary | ICD-10-CM | POA: Diagnosis not present

## 2018-11-20 DIAGNOSIS — N2581 Secondary hyperparathyroidism of renal origin: Secondary | ICD-10-CM | POA: Diagnosis not present

## 2018-11-20 DIAGNOSIS — D631 Anemia in chronic kidney disease: Secondary | ICD-10-CM | POA: Diagnosis not present

## 2018-11-22 DIAGNOSIS — N2581 Secondary hyperparathyroidism of renal origin: Secondary | ICD-10-CM | POA: Diagnosis not present

## 2018-11-22 DIAGNOSIS — D631 Anemia in chronic kidney disease: Secondary | ICD-10-CM | POA: Diagnosis not present

## 2018-11-22 DIAGNOSIS — N186 End stage renal disease: Secondary | ICD-10-CM | POA: Diagnosis not present

## 2018-11-22 DIAGNOSIS — D509 Iron deficiency anemia, unspecified: Secondary | ICD-10-CM | POA: Diagnosis not present

## 2018-11-22 DIAGNOSIS — Z992 Dependence on renal dialysis: Secondary | ICD-10-CM | POA: Diagnosis not present

## 2018-11-25 DIAGNOSIS — N186 End stage renal disease: Secondary | ICD-10-CM | POA: Diagnosis not present

## 2018-11-25 DIAGNOSIS — D631 Anemia in chronic kidney disease: Secondary | ICD-10-CM | POA: Diagnosis not present

## 2018-11-25 DIAGNOSIS — D509 Iron deficiency anemia, unspecified: Secondary | ICD-10-CM | POA: Diagnosis not present

## 2018-11-25 DIAGNOSIS — N2581 Secondary hyperparathyroidism of renal origin: Secondary | ICD-10-CM | POA: Diagnosis not present

## 2018-11-25 DIAGNOSIS — Z992 Dependence on renal dialysis: Secondary | ICD-10-CM | POA: Diagnosis not present

## 2018-11-27 DIAGNOSIS — I120 Hypertensive chronic kidney disease with stage 5 chronic kidney disease or end stage renal disease: Secondary | ICD-10-CM | POA: Diagnosis not present

## 2018-11-27 DIAGNOSIS — E1165 Type 2 diabetes mellitus with hyperglycemia: Secondary | ICD-10-CM | POA: Diagnosis not present

## 2018-11-27 DIAGNOSIS — Z888 Allergy status to other drugs, medicaments and biological substances status: Secondary | ICD-10-CM | POA: Diagnosis not present

## 2018-11-27 DIAGNOSIS — D509 Iron deficiency anemia, unspecified: Secondary | ICD-10-CM | POA: Diagnosis not present

## 2018-11-27 DIAGNOSIS — L97518 Non-pressure chronic ulcer of other part of right foot with other specified severity: Secondary | ICD-10-CM | POA: Diagnosis not present

## 2018-11-27 DIAGNOSIS — E1140 Type 2 diabetes mellitus with diabetic neuropathy, unspecified: Secondary | ICD-10-CM | POA: Diagnosis not present

## 2018-11-27 DIAGNOSIS — Z79899 Other long term (current) drug therapy: Secondary | ICD-10-CM | POA: Diagnosis not present

## 2018-11-27 DIAGNOSIS — E13621 Other specified diabetes mellitus with foot ulcer: Secondary | ICD-10-CM | POA: Diagnosis not present

## 2018-11-27 DIAGNOSIS — W19XXXA Unspecified fall, initial encounter: Secondary | ICD-10-CM | POA: Diagnosis not present

## 2018-11-27 DIAGNOSIS — E1122 Type 2 diabetes mellitus with diabetic chronic kidney disease: Secondary | ICD-10-CM | POA: Diagnosis not present

## 2018-11-27 DIAGNOSIS — L97521 Non-pressure chronic ulcer of other part of left foot limited to breakdown of skin: Secondary | ICD-10-CM | POA: Diagnosis not present

## 2018-11-27 DIAGNOSIS — N2581 Secondary hyperparathyroidism of renal origin: Secondary | ICD-10-CM | POA: Diagnosis not present

## 2018-11-27 DIAGNOSIS — S8000XA Contusion of unspecified knee, initial encounter: Secondary | ICD-10-CM | POA: Diagnosis not present

## 2018-11-27 DIAGNOSIS — L97519 Non-pressure chronic ulcer of other part of right foot with unspecified severity: Secondary | ICD-10-CM | POA: Diagnosis not present

## 2018-11-27 DIAGNOSIS — R000 Tachycardia, unspecified: Secondary | ICD-10-CM | POA: Diagnosis not present

## 2018-11-27 DIAGNOSIS — E039 Hypothyroidism, unspecified: Secondary | ICD-10-CM | POA: Diagnosis not present

## 2018-11-27 DIAGNOSIS — S80212A Abrasion, left knee, initial encounter: Secondary | ICD-10-CM | POA: Diagnosis not present

## 2018-11-27 DIAGNOSIS — M25561 Pain in right knee: Secondary | ICD-10-CM | POA: Diagnosis not present

## 2018-11-27 DIAGNOSIS — I129 Hypertensive chronic kidney disease with stage 1 through stage 4 chronic kidney disease, or unspecified chronic kidney disease: Secondary | ICD-10-CM | POA: Diagnosis not present

## 2018-11-27 DIAGNOSIS — M25552 Pain in left hip: Secondary | ICD-10-CM | POA: Diagnosis not present

## 2018-11-27 DIAGNOSIS — R062 Wheezing: Secondary | ICD-10-CM | POA: Diagnosis not present

## 2018-11-27 DIAGNOSIS — S8001XA Contusion of right knee, initial encounter: Secondary | ICD-10-CM | POA: Diagnosis not present

## 2018-11-27 DIAGNOSIS — S8002XA Contusion of left knee, initial encounter: Secondary | ICD-10-CM | POA: Diagnosis not present

## 2018-11-27 DIAGNOSIS — N186 End stage renal disease: Secondary | ICD-10-CM | POA: Diagnosis not present

## 2018-11-27 DIAGNOSIS — M25562 Pain in left knee: Secondary | ICD-10-CM | POA: Diagnosis not present

## 2018-11-27 DIAGNOSIS — S80812A Abrasion, left lower leg, initial encounter: Secondary | ICD-10-CM | POA: Diagnosis not present

## 2018-11-27 DIAGNOSIS — D631 Anemia in chronic kidney disease: Secondary | ICD-10-CM | POA: Diagnosis not present

## 2018-11-27 DIAGNOSIS — I96 Gangrene, not elsewhere classified: Secondary | ICD-10-CM | POA: Diagnosis not present

## 2018-11-27 DIAGNOSIS — Z992 Dependence on renal dialysis: Secondary | ICD-10-CM | POA: Diagnosis not present

## 2018-11-27 DIAGNOSIS — M7981 Nontraumatic hematoma of soft tissue: Secondary | ICD-10-CM | POA: Diagnosis not present

## 2018-11-27 DIAGNOSIS — Z885 Allergy status to narcotic agent status: Secondary | ICD-10-CM | POA: Diagnosis not present

## 2018-11-27 DIAGNOSIS — S90414A Abrasion, right lesser toe(s), initial encounter: Secondary | ICD-10-CM | POA: Diagnosis not present

## 2018-11-27 DIAGNOSIS — Z794 Long term (current) use of insulin: Secondary | ICD-10-CM | POA: Diagnosis not present

## 2018-11-27 DIAGNOSIS — E11621 Type 2 diabetes mellitus with foot ulcer: Secondary | ICD-10-CM | POA: Diagnosis not present

## 2018-11-27 DIAGNOSIS — M25551 Pain in right hip: Secondary | ICD-10-CM | POA: Diagnosis not present

## 2018-11-27 DIAGNOSIS — Z87891 Personal history of nicotine dependence: Secondary | ICD-10-CM | POA: Diagnosis not present

## 2018-11-27 DIAGNOSIS — E7800 Pure hypercholesterolemia, unspecified: Secondary | ICD-10-CM | POA: Diagnosis not present

## 2018-11-28 DIAGNOSIS — W19XXXA Unspecified fall, initial encounter: Secondary | ICD-10-CM | POA: Diagnosis not present

## 2018-11-28 DIAGNOSIS — S90414A Abrasion, right lesser toe(s), initial encounter: Secondary | ICD-10-CM | POA: Diagnosis not present

## 2018-11-28 DIAGNOSIS — I959 Hypotension, unspecified: Secondary | ICD-10-CM | POA: Diagnosis not present

## 2018-11-28 DIAGNOSIS — R52 Pain, unspecified: Secondary | ICD-10-CM | POA: Diagnosis not present

## 2018-11-28 DIAGNOSIS — E11621 Type 2 diabetes mellitus with foot ulcer: Secondary | ICD-10-CM | POA: Diagnosis not present

## 2018-11-28 DIAGNOSIS — L97521 Non-pressure chronic ulcer of other part of left foot limited to breakdown of skin: Secondary | ICD-10-CM | POA: Diagnosis not present

## 2018-11-29 DIAGNOSIS — D631 Anemia in chronic kidney disease: Secondary | ICD-10-CM | POA: Diagnosis not present

## 2018-11-29 DIAGNOSIS — N186 End stage renal disease: Secondary | ICD-10-CM | POA: Diagnosis not present

## 2018-11-29 DIAGNOSIS — D509 Iron deficiency anemia, unspecified: Secondary | ICD-10-CM | POA: Diagnosis not present

## 2018-11-29 DIAGNOSIS — N2581 Secondary hyperparathyroidism of renal origin: Secondary | ICD-10-CM | POA: Diagnosis not present

## 2018-11-29 DIAGNOSIS — Z992 Dependence on renal dialysis: Secondary | ICD-10-CM | POA: Diagnosis not present

## 2018-12-01 ENCOUNTER — Emergency Department (HOSPITAL_COMMUNITY): Payer: Medicare Other | Admitting: Certified Registered"

## 2018-12-01 ENCOUNTER — Emergency Department (HOSPITAL_COMMUNITY): Payer: Medicare Other

## 2018-12-01 ENCOUNTER — Encounter (HOSPITAL_COMMUNITY): Payer: Self-pay

## 2018-12-01 ENCOUNTER — Other Ambulatory Visit: Payer: Self-pay

## 2018-12-01 ENCOUNTER — Inpatient Hospital Stay (HOSPITAL_COMMUNITY)
Admission: EM | Admit: 2018-12-01 | Discharge: 2018-12-03 | DRG: 239 | Disposition: A | Discharge status: Rehabilitation Facility | Payer: Medicare Other | Attending: Vascular Surgery | Admitting: Vascular Surgery

## 2018-12-01 ENCOUNTER — Encounter (HOSPITAL_COMMUNITY)
Admission: EM | Disposition: A | Discharge status: Rehabilitation Facility | Payer: Self-pay | Source: Home / Self Care | Attending: Vascular Surgery

## 2018-12-01 DIAGNOSIS — Z7902 Long term (current) use of antithrombotics/antiplatelets: Secondary | ICD-10-CM

## 2018-12-01 DIAGNOSIS — Z885 Allergy status to narcotic agent status: Secondary | ICD-10-CM

## 2018-12-01 DIAGNOSIS — Z7951 Long term (current) use of inhaled steroids: Secondary | ICD-10-CM

## 2018-12-01 DIAGNOSIS — Z8249 Family history of ischemic heart disease and other diseases of the circulatory system: Secondary | ICD-10-CM | POA: Diagnosis not present

## 2018-12-01 DIAGNOSIS — Z79891 Long term (current) use of opiate analgesic: Secondary | ICD-10-CM

## 2018-12-01 DIAGNOSIS — Z87891 Personal history of nicotine dependence: Secondary | ICD-10-CM

## 2018-12-01 DIAGNOSIS — I7090 Unspecified atherosclerosis: Secondary | ICD-10-CM

## 2018-12-01 DIAGNOSIS — M7989 Other specified soft tissue disorders: Secondary | ICD-10-CM | POA: Diagnosis not present

## 2018-12-01 DIAGNOSIS — A480 Gas gangrene: Secondary | ICD-10-CM | POA: Diagnosis not present

## 2018-12-01 DIAGNOSIS — E11649 Type 2 diabetes mellitus with hypoglycemia without coma: Secondary | ICD-10-CM | POA: Diagnosis not present

## 2018-12-01 DIAGNOSIS — G8918 Other acute postprocedural pain: Secondary | ICD-10-CM

## 2018-12-01 DIAGNOSIS — M792 Neuralgia and neuritis, unspecified: Secondary | ICD-10-CM | POA: Diagnosis not present

## 2018-12-01 DIAGNOSIS — S88111A Complete traumatic amputation at level between knee and ankle, right lower leg, initial encounter: Secondary | ICD-10-CM | POA: Diagnosis not present

## 2018-12-01 DIAGNOSIS — Z4781 Encounter for orthopedic aftercare following surgical amputation: Secondary | ICD-10-CM | POA: Diagnosis present

## 2018-12-01 DIAGNOSIS — Z20828 Contact with and (suspected) exposure to other viral communicable diseases: Secondary | ICD-10-CM | POA: Diagnosis not present

## 2018-12-01 DIAGNOSIS — I998 Other disorder of circulatory system: Secondary | ICD-10-CM | POA: Diagnosis not present

## 2018-12-01 DIAGNOSIS — G479 Sleep disorder, unspecified: Secondary | ICD-10-CM | POA: Diagnosis not present

## 2018-12-01 DIAGNOSIS — M726 Necrotizing fasciitis: Secondary | ICD-10-CM | POA: Diagnosis not present

## 2018-12-01 DIAGNOSIS — E1122 Type 2 diabetes mellitus with diabetic chronic kidney disease: Secondary | ICD-10-CM | POA: Diagnosis not present

## 2018-12-01 DIAGNOSIS — J449 Chronic obstructive pulmonary disease, unspecified: Secondary | ICD-10-CM | POA: Diagnosis not present

## 2018-12-01 DIAGNOSIS — Z992 Dependence on renal dialysis: Secondary | ICD-10-CM

## 2018-12-01 DIAGNOSIS — F419 Anxiety disorder, unspecified: Secondary | ICD-10-CM | POA: Diagnosis not present

## 2018-12-01 DIAGNOSIS — Z79899 Other long term (current) drug therapy: Secondary | ICD-10-CM

## 2018-12-01 DIAGNOSIS — E1152 Type 2 diabetes mellitus with diabetic peripheral angiopathy with gangrene: Secondary | ICD-10-CM | POA: Diagnosis not present

## 2018-12-01 DIAGNOSIS — D649 Anemia, unspecified: Secondary | ICD-10-CM

## 2018-12-01 DIAGNOSIS — R7309 Other abnormal glucose: Secondary | ICD-10-CM | POA: Diagnosis not present

## 2018-12-01 DIAGNOSIS — I10 Essential (primary) hypertension: Secondary | ICD-10-CM | POA: Diagnosis not present

## 2018-12-01 DIAGNOSIS — F329 Major depressive disorder, single episode, unspecified: Secondary | ICD-10-CM | POA: Diagnosis present

## 2018-12-01 DIAGNOSIS — N2581 Secondary hyperparathyroidism of renal origin: Secondary | ICD-10-CM | POA: Diagnosis present

## 2018-12-01 DIAGNOSIS — G546 Phantom limb syndrome with pain: Secondary | ICD-10-CM | POA: Diagnosis not present

## 2018-12-01 DIAGNOSIS — K219 Gastro-esophageal reflux disease without esophagitis: Secondary | ICD-10-CM | POA: Diagnosis present

## 2018-12-01 DIAGNOSIS — N186 End stage renal disease: Secondary | ICD-10-CM | POA: Diagnosis present

## 2018-12-01 DIAGNOSIS — E1151 Type 2 diabetes mellitus with diabetic peripheral angiopathy without gangrene: Secondary | ICD-10-CM | POA: Diagnosis not present

## 2018-12-01 DIAGNOSIS — L89892 Pressure ulcer of other site, stage 2: Secondary | ICD-10-CM | POA: Diagnosis not present

## 2018-12-01 DIAGNOSIS — Z794 Long term (current) use of insulin: Secondary | ICD-10-CM

## 2018-12-01 DIAGNOSIS — D638 Anemia in other chronic diseases classified elsewhere: Secondary | ICD-10-CM | POA: Diagnosis not present

## 2018-12-01 DIAGNOSIS — K5903 Drug induced constipation: Secondary | ICD-10-CM

## 2018-12-01 DIAGNOSIS — E8889 Other specified metabolic disorders: Secondary | ICD-10-CM | POA: Diagnosis present

## 2018-12-01 DIAGNOSIS — Z79890 Hormone replacement therapy (postmenopausal): Secondary | ICD-10-CM | POA: Diagnosis not present

## 2018-12-01 DIAGNOSIS — I120 Hypertensive chronic kidney disease with stage 5 chronic kidney disease or end stage renal disease: Secondary | ICD-10-CM | POA: Diagnosis not present

## 2018-12-01 DIAGNOSIS — E1165 Type 2 diabetes mellitus with hyperglycemia: Secondary | ICD-10-CM

## 2018-12-01 DIAGNOSIS — I96 Gangrene, not elsewhere classified: Secondary | ICD-10-CM | POA: Diagnosis not present

## 2018-12-01 DIAGNOSIS — D62 Acute posthemorrhagic anemia: Secondary | ICD-10-CM | POA: Diagnosis not present

## 2018-12-01 DIAGNOSIS — E1142 Type 2 diabetes mellitus with diabetic polyneuropathy: Secondary | ICD-10-CM

## 2018-12-01 DIAGNOSIS — Z8659 Personal history of other mental and behavioral disorders: Secondary | ICD-10-CM | POA: Diagnosis not present

## 2018-12-01 DIAGNOSIS — Z888 Allergy status to other drugs, medicaments and biological substances status: Secondary | ICD-10-CM

## 2018-12-01 DIAGNOSIS — E162 Hypoglycemia, unspecified: Secondary | ICD-10-CM | POA: Diagnosis not present

## 2018-12-01 DIAGNOSIS — E1140 Type 2 diabetes mellitus with diabetic neuropathy, unspecified: Secondary | ICD-10-CM | POA: Diagnosis not present

## 2018-12-01 DIAGNOSIS — E669 Obesity, unspecified: Secondary | ICD-10-CM | POA: Diagnosis present

## 2018-12-01 DIAGNOSIS — E039 Hypothyroidism, unspecified: Secondary | ICD-10-CM | POA: Diagnosis present

## 2018-12-01 DIAGNOSIS — L97429 Non-pressure chronic ulcer of left heel and midfoot with unspecified severity: Secondary | ICD-10-CM | POA: Diagnosis present

## 2018-12-01 DIAGNOSIS — I700 Atherosclerosis of aorta: Secondary | ICD-10-CM | POA: Diagnosis present

## 2018-12-01 DIAGNOSIS — I160 Hypertensive urgency: Secondary | ICD-10-CM | POA: Diagnosis not present

## 2018-12-01 DIAGNOSIS — D631 Anemia in chronic kidney disease: Secondary | ICD-10-CM | POA: Diagnosis not present

## 2018-12-01 DIAGNOSIS — E785 Hyperlipidemia, unspecified: Secondary | ICD-10-CM | POA: Diagnosis present

## 2018-12-01 DIAGNOSIS — I70261 Atherosclerosis of native arteries of extremities with gangrene, right leg: Secondary | ICD-10-CM | POA: Diagnosis not present

## 2018-12-01 DIAGNOSIS — R0989 Other specified symptoms and signs involving the circulatory and respiratory systems: Secondary | ICD-10-CM | POA: Diagnosis not present

## 2018-12-01 DIAGNOSIS — K449 Diaphragmatic hernia without obstruction or gangrene: Secondary | ICD-10-CM | POA: Diagnosis present

## 2018-12-01 DIAGNOSIS — Z89511 Acquired absence of right leg below knee: Secondary | ICD-10-CM | POA: Diagnosis not present

## 2018-12-01 DIAGNOSIS — R062 Wheezing: Secondary | ICD-10-CM | POA: Diagnosis not present

## 2018-12-01 HISTORY — PX: AMPUTATION: SHX166

## 2018-12-01 LAB — BASIC METABOLIC PANEL
Anion gap: 15 (ref 5–15)
BUN: 48 mg/dL — ABNORMAL HIGH (ref 8–23)
CO2: 25 mmol/L (ref 22–32)
Calcium: 9.2 mg/dL (ref 8.9–10.3)
Chloride: 96 mmol/L — ABNORMAL LOW (ref 98–111)
Creatinine, Ser: 9.3 mg/dL — ABNORMAL HIGH (ref 0.61–1.24)
GFR calc Af Amer: 6 mL/min — ABNORMAL LOW (ref >60)
GFR calc non Af Amer: 5 mL/min — ABNORMAL LOW (ref >60)
Glucose, Bld: 119 mg/dL — ABNORMAL HIGH (ref 70–99)
Potassium: 3.9 mmol/L (ref 3.5–5.1)
Sodium: 136 mmol/L (ref 135–145)

## 2018-12-01 LAB — CBC WITH DIFFERENTIAL/PLATELET
Abs Immature Granulocytes: 0.19 10*3/uL — ABNORMAL HIGH (ref 0.00–0.07)
Basophils Absolute: 0.1 10*3/uL (ref 0.0–0.1)
Basophils Relative: 0 %
Eosinophils Absolute: 0.1 10*3/uL (ref 0.0–0.5)
Eosinophils Relative: 1 %
HCT: 27.6 % — ABNORMAL LOW (ref 39.0–52.0)
Hemoglobin: 8.4 g/dL — ABNORMAL LOW (ref 13.0–17.0)
Immature Granulocytes: 1 %
Lymphocytes Relative: 9 %
Lymphs Abs: 1.3 10*3/uL (ref 0.7–4.0)
MCH: 30 pg (ref 26.0–34.0)
MCHC: 30.4 g/dL (ref 30.0–36.0)
MCV: 98.6 fL (ref 80.0–100.0)
Monocytes Absolute: 1.1 10*3/uL — ABNORMAL HIGH (ref 0.1–1.0)
Monocytes Relative: 8 %
Neutro Abs: 12 10*3/uL — ABNORMAL HIGH (ref 1.7–7.7)
Neutrophils Relative %: 81 %
Platelets: 333 10*3/uL (ref 150–400)
RBC: 2.8 MIL/uL — ABNORMAL LOW (ref 4.22–5.81)
RDW: 15.6 % — ABNORMAL HIGH (ref 11.5–15.5)
WBC: 14.8 10*3/uL — ABNORMAL HIGH (ref 4.0–10.5)
nRBC: 0 % (ref 0.0–0.2)

## 2018-12-01 LAB — LACTIC ACID, PLASMA
Lactic Acid, Venous: 1.3 mmol/L (ref 0.5–1.9)
Lactic Acid, Venous: 1 mmol/L (ref 0.5–1.9)

## 2018-12-01 LAB — POCT I-STAT 4, (NA,K, GLUC, HGB,HCT)
Glucose, Bld: 142 mg/dL — ABNORMAL HIGH (ref 70–99)
HCT: 36 % — ABNORMAL LOW (ref 39.0–52.0)
Hemoglobin: 12.2 g/dL — ABNORMAL LOW (ref 13.0–17.0)
Potassium: 4.3 mmol/L (ref 3.5–5.1)
Sodium: 136 mmol/L (ref 135–145)

## 2018-12-01 LAB — HEMOGLOBIN AND HEMATOCRIT, BLOOD
HCT: 25.8 % — ABNORMAL LOW (ref 39.0–52.0)
Hemoglobin: 8.1 g/dL — ABNORMAL LOW (ref 13.0–17.0)

## 2018-12-01 LAB — GLUCOSE, CAPILLARY
Glucose-Capillary: 92 mg/dL (ref 70–99)
Glucose-Capillary: 130 mg/dL — ABNORMAL HIGH (ref 70–99)

## 2018-12-01 LAB — CBG MONITORING, ED: Glucose-Capillary: 105 mg/dL — ABNORMAL HIGH (ref 70–99)

## 2018-12-01 LAB — SARS CORONAVIRUS 2 BY RT PCR (HOSPITAL ORDER, PERFORMED IN ~~LOC~~ HOSPITAL LAB): SARS Coronavirus 2: NEGATIVE

## 2018-12-01 LAB — PREPARE RBC (CROSSMATCH)

## 2018-12-01 SURGERY — AMPUTATION BELOW KNEE
Anesthesia: General | Site: Leg Lower | Laterality: Right

## 2018-12-01 MED ORDER — ATORVASTATIN CALCIUM 80 MG PO TABS
80.0000 mg | ORAL_TABLET | Freq: Every day | ORAL | Status: DC
  Administered 2018-12-02 (×2): 80 mg via ORAL
  Filled 2018-12-01 (×2): qty 1

## 2018-12-01 MED ORDER — FENTANYL CITRATE (PF) 100 MCG/2ML IJ SOLN
INTRAMUSCULAR | Status: DC | PRN
  Administered 2018-12-01: 150 ug via INTRAVENOUS
  Administered 2018-12-01: 50 ug via INTRAVENOUS

## 2018-12-01 MED ORDER — INSULIN ASPART 100 UNIT/ML ~~LOC~~ SOLN
0.0000 [IU] | Freq: Three times a day (TID) | SUBCUTANEOUS | Status: DC
  Administered 2018-12-02: 3 [IU] via SUBCUTANEOUS
  Administered 2018-12-02: 07:00:00 2 [IU] via SUBCUTANEOUS
  Administered 2018-12-02: 3 [IU] via SUBCUTANEOUS
  Administered 2018-12-03: 2 [IU] via SUBCUTANEOUS
  Administered 2018-12-03: 5 [IU] via SUBCUTANEOUS

## 2018-12-01 MED ORDER — CINACALCET HCL 30 MG PO TABS
30.0000 mg | ORAL_TABLET | Freq: Every evening | ORAL | Status: DC
  Administered 2018-12-02 – 2018-12-03 (×2): 30 mg via ORAL
  Filled 2018-12-01 (×2): qty 1

## 2018-12-01 MED ORDER — HYDROMORPHONE HCL 1 MG/ML IJ SOLN
INTRAMUSCULAR | Status: AC
  Filled 2018-12-01: qty 1

## 2018-12-01 MED ORDER — HYDRALAZINE HCL 20 MG/ML IJ SOLN: 5.0000 mg | INTRAMUSCULAR | Status: DC | PRN

## 2018-12-01 MED ORDER — CEFAZOLIN SODIUM-DEXTROSE 2-4 GM/100ML-% IV SOLN
2.0000 g | INTRAVENOUS | Status: AC
  Administered 2018-12-01: 2 g via INTRAVENOUS
  Filled 2018-12-01: qty 100

## 2018-12-01 MED ORDER — LEVOTHYROXINE SODIUM 25 MCG PO TABS
125.0000 ug | ORAL_TABLET | Freq: Every day | ORAL | Status: DC
  Administered 2018-12-02 – 2018-12-03 (×2): 125 ug via ORAL
  Filled 2018-12-01 (×2): qty 1

## 2018-12-01 MED ORDER — METOPROLOL TARTRATE 5 MG/5ML IV SOLN: 2.0000 mg | INTRAVENOUS | Status: DC | PRN

## 2018-12-01 MED ORDER — ACETAMINOPHEN 10 MG/ML IV SOLN
1000.0000 mg | Freq: Once | INTRAVENOUS | Status: AC
  Administered 2018-12-01: 1000 mg via INTRAVENOUS

## 2018-12-01 MED ORDER — BISACODYL 10 MG RE SUPP: 10.0000 mg | Freq: Every day | RECTAL | Status: DC | PRN

## 2018-12-01 MED ORDER — CALCIUM ACETATE (PHOS BINDER) 667 MG PO CAPS
1334.0000 mg | ORAL_CAPSULE | ORAL | Status: DC | PRN
  Administered 2018-12-02 – 2018-12-03 (×2): 1334 mg via ORAL

## 2018-12-01 MED ORDER — SUCCINYLCHOLINE CHLORIDE 200 MG/10ML IV SOSY
PREFILLED_SYRINGE | INTRAVENOUS | Status: AC
  Filled 2018-12-01: qty 10

## 2018-12-01 MED ORDER — NORTRIPTYLINE HCL 25 MG PO CAPS: 50.0000 mg | ORAL_CAPSULE | Freq: Every day | ORAL | Status: DC

## 2018-12-01 MED ORDER — PANTOPRAZOLE SODIUM 40 MG PO TBEC
40.0000 mg | DELAYED_RELEASE_TABLET | Freq: Every day | ORAL | Status: DC
  Administered 2018-12-02 – 2018-12-03 (×2): 40 mg via ORAL
  Filled 2018-12-01 (×2): qty 1

## 2018-12-01 MED ORDER — DOCUSATE SODIUM 100 MG PO CAPS
100.0000 mg | ORAL_CAPSULE | Freq: Every day | ORAL | Status: DC
  Filled 2018-12-01 (×2): qty 1

## 2018-12-01 MED ORDER — LIDOCAINE 2% (20 MG/ML) 5 ML SYRINGE
INTRAMUSCULAR | Status: AC
  Filled 2018-12-01: qty 5

## 2018-12-01 MED ORDER — PHENOL 1.4 % MT LIQD: 1.0000 | OROMUCOSAL | Status: DC | PRN

## 2018-12-01 MED ORDER — RISPERIDONE 0.25 MG PO TABS
0.2500 mg | ORAL_TABLET | Freq: Every day | ORAL | Status: DC
  Administered 2018-12-02 – 2018-12-03 (×2): 0.25 mg via ORAL
  Filled 2018-12-01 (×2): qty 1

## 2018-12-01 MED ORDER — POLYETHYLENE GLYCOL 3350 17 G PO PACK: 17.0000 g | PACK | Freq: Every day | ORAL | Status: DC | PRN

## 2018-12-01 MED ORDER — PREGABALIN 75 MG PO CAPS
150.0000 mg | ORAL_CAPSULE | Freq: Three times a day (TID) | ORAL | Status: DC
  Administered 2018-12-02 – 2018-12-03 (×6): 150 mg via ORAL
  Filled 2018-12-01 (×6): qty 2

## 2018-12-01 MED ORDER — ACETAMINOPHEN 325 MG RE SUPP: 325.0000 mg | RECTAL | Status: DC | PRN

## 2018-12-01 MED ORDER — PROPOFOL 10 MG/ML IV BOLUS
INTRAVENOUS | Status: AC
  Filled 2018-12-01: qty 20

## 2018-12-01 MED ORDER — HYDROMORPHONE HCL 1 MG/ML IJ SOLN
0.5000 mg | INTRAMUSCULAR | Status: DC | PRN
  Administered 2018-12-01: 0.5 mg via INTRAVENOUS

## 2018-12-01 MED ORDER — OXYCODONE-ACETAMINOPHEN 5-325 MG PO TABS
1.0000 | ORAL_TABLET | ORAL | Status: DC | PRN
  Administered 2018-12-02 (×2): 2 via ORAL
  Administered 2018-12-02 (×2): 1 via ORAL
  Administered 2018-12-03 (×3): 2 via ORAL
  Filled 2018-12-01 (×3): qty 2
  Filled 2018-12-01: qty 1
  Filled 2018-12-01: qty 2
  Filled 2018-12-01: qty 1
  Filled 2018-12-01: qty 2

## 2018-12-01 MED ORDER — ONDANSETRON HCL 4 MG/2ML IJ SOLN: 4.0000 mg | Freq: Four times a day (QID) | INTRAMUSCULAR | Status: DC | PRN

## 2018-12-01 MED ORDER — PROPOFOL 10 MG/ML IV BOLUS
INTRAVENOUS | Status: DC | PRN
  Administered 2018-12-01: 170 mg via INTRAVENOUS

## 2018-12-01 MED ORDER — OXYCODONE HCL 5 MG PO TABS: 5.0000 mg | ORAL_TABLET | Freq: Once | ORAL | Status: DC | PRN

## 2018-12-01 MED ORDER — MAGNESIUM SULFATE 2 GM/50ML IV SOLN: 2.0000 g | Freq: Every day | INTRAVENOUS | Status: DC | PRN

## 2018-12-01 MED ORDER — GUAIFENESIN-DM 100-10 MG/5ML PO SYRP: 15.0000 mL | ORAL_SOLUTION | ORAL | Status: DC | PRN

## 2018-12-01 MED ORDER — SODIUM CHLORIDE 0.9 % IV SOLN
INTRAVENOUS | Status: DC | PRN
  Administered 2018-12-01: 75 ug/min via INTRAVENOUS

## 2018-12-01 MED ORDER — FENTANYL CITRATE (PF) 250 MCG/5ML IJ SOLN
INTRAMUSCULAR | Status: AC
  Filled 2018-12-01: qty 5

## 2018-12-01 MED ORDER — FENTANYL CITRATE (PF) 100 MCG/2ML IJ SOLN
25.0000 ug | INTRAMUSCULAR | Status: DC | PRN
  Administered 2018-12-01 (×2): 25 ug via INTRAVENOUS

## 2018-12-01 MED ORDER — FENTANYL CITRATE (PF) 100 MCG/2ML IJ SOLN
INTRAMUSCULAR | Status: AC
  Filled 2018-12-01: qty 2

## 2018-12-01 MED ORDER — SODIUM CHLORIDE 0.9 % IV SOLN
10.0000 mL/h | Freq: Once | INTRAVENOUS | Status: AC
  Administered 2018-12-01: 19:00:00 via INTRAVENOUS

## 2018-12-01 MED ORDER — HYDROMORPHONE HCL 1 MG/ML IJ SOLN
0.2500 mg | Freq: Once | INTRAMUSCULAR | Status: AC
  Administered 2018-12-01: 23:00:00 0.5 mg via INTRAVENOUS

## 2018-12-01 MED ORDER — HYDROMORPHONE HCL 1 MG/ML IJ SOLN
0.5000 mg | INTRAMUSCULAR | Status: DC | PRN
  Administered 2018-12-02 (×2): 0.5 mg via INTRAVENOUS
  Filled 2018-12-01: qty 1

## 2018-12-01 MED ORDER — LABETALOL HCL 5 MG/ML IV SOLN: 10.0000 mg | INTRAVENOUS | Status: DC | PRN

## 2018-12-01 MED ORDER — MIDAZOLAM HCL 5 MG/5ML IJ SOLN
INTRAMUSCULAR | Status: DC | PRN
  Administered 2018-12-01: 2 mg via INTRAVENOUS

## 2018-12-01 MED ORDER — ACETAMINOPHEN 10 MG/ML IV SOLN
INTRAVENOUS | Status: AC
  Filled 2018-12-01: qty 100

## 2018-12-01 MED ORDER — MIDAZOLAM HCL 2 MG/2ML IJ SOLN
INTRAMUSCULAR | Status: AC
  Filled 2018-12-01: qty 2

## 2018-12-01 MED ORDER — PIPERACILLIN-TAZOBACTAM 3.375 G IVPB 30 MIN
3.3750 g | Freq: Once | INTRAVENOUS | Status: AC
  Administered 2018-12-01: 15:00:00 3.375 g via INTRAVENOUS
  Filled 2018-12-01: qty 50

## 2018-12-01 MED ORDER — MELATONIN 3 MG PO TABS
9.0000 mg | ORAL_TABLET | Freq: Every day | ORAL | Status: DC
  Administered 2018-12-02 (×2): 9 mg via ORAL
  Filled 2018-12-01 (×3): qty 3

## 2018-12-01 MED ORDER — ACETAMINOPHEN 325 MG PO TABS: 325.0000 mg | ORAL_TABLET | ORAL | Status: DC | PRN

## 2018-12-01 MED ORDER — BACITRACIN-NEOMYCIN-POLYMYXIN 400-5-5000 EX OINT
TOPICAL_OINTMENT | CUTANEOUS | Status: AC
  Filled 2018-12-01: qty 1

## 2018-12-01 MED ORDER — TRAZODONE HCL 50 MG PO TABS
50.0000 mg | ORAL_TABLET | Freq: Every day | ORAL | Status: DC
  Administered 2018-12-02 – 2018-12-03 (×2): 50 mg via ORAL
  Filled 2018-12-01 (×2): qty 1

## 2018-12-01 MED ORDER — HEPARIN SODIUM (PORCINE) 5000 UNIT/ML IJ SOLN
5000.0000 [IU] | Freq: Three times a day (TID) | INTRAMUSCULAR | Status: DC
  Administered 2018-12-03 (×2): 5000 [IU] via SUBCUTANEOUS
  Filled 2018-12-01 (×2): qty 1

## 2018-12-01 MED ORDER — ALBUTEROL SULFATE (2.5 MG/3ML) 0.083% IN NEBU
2.5000 mg | INHALATION_SOLUTION | Freq: Four times a day (QID) | RESPIRATORY_TRACT | Status: DC | PRN
  Administered 2018-12-01: 22:00:00 2.5 mg via RESPIRATORY_TRACT

## 2018-12-01 MED ORDER — VANCOMYCIN HCL IN DEXTROSE 1-5 GM/200ML-% IV SOLN
1000.0000 mg | Freq: Once | INTRAVENOUS | Status: AC
  Administered 2018-12-01: 1000 mg via INTRAVENOUS
  Filled 2018-12-01: qty 200

## 2018-12-01 MED ORDER — CEFAZOLIN SODIUM-DEXTROSE 2-4 GM/100ML-% IV SOLN
2.0000 g | Freq: Three times a day (TID) | INTRAVENOUS | Status: AC
  Administered 2018-12-02 (×2): 2 g via INTRAVENOUS
  Filled 2018-12-01 (×2): qty 100

## 2018-12-01 MED ORDER — 0.9 % SODIUM CHLORIDE (POUR BTL) OPTIME
TOPICAL | Status: DC | PRN
  Administered 2018-12-01 (×2): 1000 mL

## 2018-12-01 MED ORDER — SUCCINYLCHOLINE CHLORIDE 200 MG/10ML IV SOSY
PREFILLED_SYRINGE | INTRAVENOUS | Status: DC | PRN
  Administered 2018-12-01: 160 mg via INTRAVENOUS

## 2018-12-01 MED ORDER — BACITRACIN-NEOMYCIN-POLYMYXIN 400-5-5000 EX OINT
TOPICAL_OINTMENT | CUTANEOUS | Status: DC | PRN
  Administered 2018-12-01: 1 via TOPICAL

## 2018-12-01 MED ORDER — ALBUMIN HUMAN 5 % IV SOLN
INTRAVENOUS | Status: DC | PRN
  Administered 2018-12-01: 20:00:00 via INTRAVENOUS

## 2018-12-01 MED ORDER — ALBUTEROL SULFATE (2.5 MG/3ML) 0.083% IN NEBU
INHALATION_SOLUTION | RESPIRATORY_TRACT | Status: AC
  Filled 2018-12-01: qty 3

## 2018-12-01 MED ORDER — OXYCODONE HCL 5 MG/5ML PO SOLN: 5.0000 mg | Freq: Once | ORAL | Status: DC | PRN

## 2018-12-01 MED ORDER — LIDOCAINE 2% (20 MG/ML) 5 ML SYRINGE
INTRAMUSCULAR | Status: DC | PRN
  Administered 2018-12-01: 30 mg via INTRAVENOUS

## 2018-12-01 MED ORDER — ALBUTEROL SULFATE (2.5 MG/3ML) 0.083% IN NEBU
3.0000 mL | INHALATION_SOLUTION | Freq: Four times a day (QID) | RESPIRATORY_TRACT | Status: DC | PRN
  Filled 2018-12-01: qty 3

## 2018-12-01 MED ORDER — INSULIN GLARGINE 100 UNIT/ML ~~LOC~~ SOLN
85.0000 [IU] | Freq: Every day | SUBCUTANEOUS | Status: DC
  Administered 2018-12-02: 85 [IU] via SUBCUTANEOUS
  Filled 2018-12-01 (×2): qty 0.85

## 2018-12-01 MED ORDER — ONDANSETRON HCL 4 MG/2ML IJ SOLN
INTRAMUSCULAR | Status: DC | PRN
  Administered 2018-12-01: 4 mg via INTRAVENOUS

## 2018-12-01 MED ORDER — CLOPIDOGREL BISULFATE 75 MG PO TABS
75.0000 mg | ORAL_TABLET | Freq: Every day | ORAL | Status: DC
  Administered 2018-12-02 – 2018-12-03 (×2): 75 mg via ORAL
  Filled 2018-12-01 (×2): qty 1

## 2018-12-01 SURGICAL SUPPLY — 49 items
BANDAGE ACE 4X5 VEL STRL LF (GAUZE/BANDAGES/DRESSINGS) ×2 IMPLANT
BANDAGE ACE 6X5 VEL STRL LF (GAUZE/BANDAGES/DRESSINGS) ×2 IMPLANT
BANDAGE ESMARK 6X9 LF (GAUZE/BANDAGES/DRESSINGS) IMPLANT
BLADE SAW RECIP 87.9 MT (BLADE) ×2 IMPLANT
BNDG COHESIVE 6X5 TAN STRL LF (GAUZE/BANDAGES/DRESSINGS) ×2 IMPLANT
BNDG ESMARK 6X9 LF (GAUZE/BANDAGES/DRESSINGS)
BNDG GAUZE ELAST 4 BULKY (GAUZE/BANDAGES/DRESSINGS) ×2 IMPLANT
CANISTER SUCT 3000ML PPV (MISCELLANEOUS) ×2 IMPLANT
CLIP VESOCCLUDE MED 6/CT (CLIP) ×2 IMPLANT
COVER BACK TABLE 60X90IN (DRAPES) ×2 IMPLANT
COVER SURGICAL LIGHT HANDLE (MISCELLANEOUS) ×2 IMPLANT
COVER WAND RF STERILE (DRAPES) ×2 IMPLANT
CUFF TOURNIQUET SINGLE 18IN (TOURNIQUET CUFF) IMPLANT
CUFF TOURNIQUET SINGLE 24IN (TOURNIQUET CUFF) IMPLANT
CUFF TOURNIQUET SINGLE 34IN LL (TOURNIQUET CUFF) IMPLANT
CUFF TOURNIQUET SINGLE 44IN (TOURNIQUET CUFF) IMPLANT
DRAIN CHANNEL 19F RND (DRAIN) IMPLANT
DRAPE HALF SHEET 40X57 (DRAPES) ×4 IMPLANT
DRAPE ORTHO SPLIT 77X108 STRL (DRAPES) ×2
DRAPE SURG ORHT 6 SPLT 77X108 (DRAPES) ×2 IMPLANT
DRSG ADAPTIC 3X8 NADH LF (GAUZE/BANDAGES/DRESSINGS) ×2 IMPLANT
ELECT REM PT RETURN 9FT ADLT (ELECTROSURGICAL) ×2
ELECTRODE REM PT RTRN 9FT ADLT (ELECTROSURGICAL) ×1 IMPLANT
EVACUATOR SILICONE 100CC (DRAIN) IMPLANT
GAUZE SPONGE 4X4 12PLY STRL (GAUZE/BANDAGES/DRESSINGS) ×4 IMPLANT
GAUZE SPONGE 4X4 12PLY STRL LF (GAUZE/BANDAGES/DRESSINGS) ×2 IMPLANT
GLOVE BIO SURGEON STRL SZ 6.5 (GLOVE) ×4 IMPLANT
GLOVE BIO SURGEON STRL SZ7.5 (GLOVE) ×2 IMPLANT
GOWN STRL REUS W/ TWL LRG LVL3 (GOWN DISPOSABLE) ×3 IMPLANT
GOWN STRL REUS W/TWL LRG LVL3 (GOWN DISPOSABLE) ×3
KIT BASIN OR (CUSTOM PROCEDURE TRAY) ×2 IMPLANT
KIT TURNOVER KIT B (KITS) ×2 IMPLANT
NS IRRIG 1000ML POUR BTL (IV SOLUTION) ×4 IMPLANT
PACK GENERAL/GYN (CUSTOM PROCEDURE TRAY) ×2 IMPLANT
PAD ARMBOARD 7.5X6 YLW CONV (MISCELLANEOUS) ×4 IMPLANT
SPONGE LAP 18X18 RF (DISPOSABLE) ×4 IMPLANT
STAPLER VISISTAT 35W (STAPLE) ×2 IMPLANT
STOCKINETTE IMPERVIOUS LG (DRAPES) ×2 IMPLANT
SUT ETHILON 3 0 PS 1 (SUTURE) IMPLANT
SUT SILK 2 0 (SUTURE) ×1
SUT SILK 2 0 SH CR/8 (SUTURE) ×4 IMPLANT
SUT SILK 2-0 18XBRD TIE 12 (SUTURE) ×1 IMPLANT
SUT VIC AB 2-0 CT1 18 (SUTURE) ×4 IMPLANT
SUT VIC AB 2-0 SH 18 (SUTURE) ×4 IMPLANT
SUT VIC AB 3-0 SH 27 (SUTURE) ×2
SUT VIC AB 3-0 SH 27X BRD (SUTURE) ×2 IMPLANT
TOWEL GREEN STERILE (TOWEL DISPOSABLE) ×4 IMPLANT
UNDERPAD 30X30 (UNDERPADS AND DIAPERS) ×2 IMPLANT
WATER STERILE IRR 1000ML POUR (IV SOLUTION) ×2 IMPLANT

## 2018-12-01 NOTE — Consult Note (Addendum)
Hospital Consult    Reason for Consult:  Infected right foot Requesting Physician:  Tamera Punt, MD MRN #:  [id number]  History of Present Illness: This is a 68 y.o. male who comes to the ER today with an infected foot.  He states that a little over a week ago, he turned his right great toe the wrong way and developed a black line on the bottom of the toe.  He states that it got progressively worse as the week went on.  He denies any fevers but did have chills.   He states that he quit smoking about a month ago.  He has some shortness of breath he states that is from his smoking.  He denies any sx of stroke including hemiparesis, speech difficulties, temporary blindness.  He has ESRD and dialyzes at [facility] in [place] on T/T/S.    The pt is on a statin for cholesterol management.  The pt is not on a daily aspirin.   Other AC:  Plavix The pt is on ACEI for hypertension.   The pt is diabetic.   Tobacco hx:  Recently quit about a month ago.    Past Medical History:  Diagnosis Date  . Anemia   . Anxiety   . Arthritis   . Chronic kidney disease   . COPD (chronic obstructive pulmonary disease) ([place])    Pt denies  . Depression   . Diabetes mellitus without complication ([place])   . GERD (gastroesophageal reflux disease)   . H/O hiatal hernia   . Headache(784.0)    Hx: Migraines  . Hypertension   . Neuropathy   . Numbness of toes    toes and feet  . Pneumonia    Hx: of several times  . Renal insufficiency   . Shortness of breath dyspnea   . Type 2 diabetes mellitus ([place])     Past Surgical History:  Procedure Laterality Date  . AV FISTULA PLACEMENT  2012      left arm   . AV FISTULA PLACEMENT Left 12/18/2012   Procedure: ARTERIOVENOUS (AV) FISTULA CREATION;  Surgeon: Angelia Mould, MD;  Location: [place];  Service: Vascular;  Laterality: Left;  . COLONOSCOPY  10/26/2011   Procedure: COLONOSCOPY;  Surgeon: Rogene Houston, MD;  [facility];  Service: Endoscopy;  Laterality:  N/A;  730  . EMBOLECTOMY Left 12/09/2012   Procedure: EMBOLECTOMY;  Surgeon: Serafina Mitchell, MD;  Location: [facility] CATH LAB;  Service: Cardiovascular;  Laterality: Left;  left arm venous embolization  . ESOPHAGOGASTRODUODENOSCOPY (EGD) WITH ESOPHAGEAL DILATION N/A 04/23/2013   Procedure: ESOPHAGOGASTRODUODENOSCOPY (EGD) WITH ESOPHAGEAL DILATION;  Surgeon: Rogene Houston, MD;  [facility];  Service: Endoscopy;  Laterality: N/A;  200-moved to 930   . FISTULA SUPERFICIALIZATION Left 06/18/2013   Procedure: FISTULA SUPERFICIALIZATION & LIGATION BRANCH X 1;  Surgeon: Mal Misty, MD;  Location: [place];  Service: Vascular;  Laterality: Left;  . HEMATOMA EVACUATION Right 02/11/2017   Procedure: EVACUATION HEMATOMA RIGHT GROIN, Repair of Right Pseudo-anerysm.;  Surgeon: [name], MD;  Location: [facility];  Service: Vascular;  Laterality: Right;  . INGUINAL HERNIA REPAIR     ,  times   2  . INSERTION OF DIALYSIS CATHETER Left 12/18/2012   Procedure: INSERTION OF DIALYSIS CATHETER;  Surgeon: Angelia Mould, MD;  Location: [place];  Service: Vascular;  Laterality: Left;  . KNEE ARTHROSCOPY  2011   Right Knee  . LOWER EXTREMITY ANGIOGRAPHY N/A 02/11/2017  Procedure: Lower Extremity Angiography;  Surgeon: Lorretta Harp, MD;  Location: [place] CV LAB;  Service: Cardiovascular;  Laterality: N/A;  . PERIPHERAL ATHRECTOMY  02/11/2017  . PERIPHERAL VASCULAR ATHERECTOMY Left 02/11/2017   Procedure: Peripheral Vascular Atherectomy;  Surgeon: Lorretta Harp, MD;  Location: [place] CV LAB;  Service: Cardiovascular;  Laterality: Left;  . REVISON OF ARTERIOVENOUS FISTULA Left 5/63/1497   Procedure: PLICATION OF LEFT BRACHIOCEPHALIC ARTERIOVENOUS FISTULA;  Surgeon: Conrad Youngwood, MD;  Location: [place];  Service: Vascular;  Laterality: Left;  . SHUNTOGRAM N/A 12/09/2012   Procedure: fistulogram;  Surgeon: Serafina Mitchell, MD;  Location: [facility] CATH LAB;  Service: Cardiovascular;  Laterality: N/A;   . SHUNTOGRAM Left 06/03/2013   Procedure: Fistulogram;  Surgeon: Serafina Mitchell, MD;  Location: [facility] CATH LAB;  Service: Cardiovascular;  Laterality: Left;  . THROMBECTOMY W/ EMBOLECTOMY Left 12/11/2012   Procedure: THROMBECTOMY ARTERIOVENOUS FISTULA;  Surgeon: Serafina Mitchell, MD;  Location: [place];  Service: Vascular;  Laterality: Left;  . TONSILLECTOMY      Allergies  Allergen Reactions  . Morphine And Related Other (See Comments)    Not effective  . Zaroxolyn [Metolazone]     Prior to Admission medications   Medication Sig Start Date End Date Taking? Authorizing Provider  ARTIFICIAL TEAR OP Place 1 drop into both eyes every 6 (six) hours as needed (dry eyes).    [provider]  atorvastatin (LIPITOR) 80 MG tablet Take 80 mg by mouth at bedtime. 08/10/15   [provider]  calcium acetate (PHOSLO) 667 MG capsule Take 2 capsules (1,334 mg total) by mouth 3 (three) times daily with meals. 02/17/17   Strader, Fransisco Hertz, PA-C  cinacalcet (SENSIPAR) 30 MG tablet Take 30 mg by mouth every evening.    [provider]  levothyroxine (SYNTHROID, LEVOTHROID) 125 MCG tablet Take 125 mcg by mouth daily before breakfast.    [provider]  lisinopril (PRINIVIL,ZESTRIL) 10 MG tablet Take 10 mg by mouth daily.    [provider]  LYRICA 50 MG capsule Take 1 tablet by mouth 3 (three) times daily. 02/04/17   [provider]  nortriptyline (PAMELOR) 25 MG capsule Take 50 mg by mouth at bedtime.  01/23/17   [provider]  polyethylene glycol (MIRALAX / GLYCOLAX) packet Take 17 g by mouth daily.    [provider]  risperiDONE (RISPERDAL) 0.25 MG tablet Take 0.25 mg by mouth daily.    [provider]  SALINE NASAL SPRAY NA Place 2 sprays into both nostrils as needed (seasonal allergies).    [provider]  TOUJEO SOLOSTAR 300 UNIT/ML SOPN Inject 36 Units as directed at bedtime. 02/01/17   [provider]   traZODone (DESYREL) 50 MG tablet Take 50 mg by mouth daily.  02/04/17   [provider]    Social History   Socioeconomic History  . Marital status: Married    Spouse name: Not on file  . Number of children: Not on file  . Years of education: Not on file  . Highest education level: Not on file  Occupational History  . Not on file  Social Needs  . Financial resource strain: Not on file  . Food insecurity:    Worry: Not on file    Inability: Not on file  . Transportation needs:    Medical: Not on file    Non-medical: Not on file  Tobacco Use  . Smoking status: Former Smoker  Years: 10.00    Types: Cigars    Last attempt to quit: 03/13/2012    Years since quitting: 6.7  . Smokeless tobacco: Never Used  Substance and Sexual Activity  . Alcohol use: No    Alcohol/week: 0.0 standard drinks  . Drug use: No  . Sexual activity: Never  Lifestyle  . Physical activity:    Days per week: Not on file    Minutes per session: Not on file  . Stress: Not on file  Relationships  . Social connections:    Talks on phone: Not on file    Gets together: Not on file    Attends religious service: Not on file    Active member of club or organization: Not on file    Attends meetings of clubs or organizations: Not on file    Relationship status: Not on file  . Intimate partner violence:    Fear of current or ex partner: Not on file    Emotionally abused: Not on file    Physically abused: Not on file    Forced sexual activity: Not on file  Other Topics Concern  . Not on file  Social History Narrative  . Not on file     Family History  Problem Relation Age of Onset  . Heart disease Father        Heart Disease before age 67    ROS: [x]  Positive   [ ]  Negative   [ ]  All sytems reviewed and are negative  Cardiac: []  chest pain/pressure []  palpitations [x]  SOB  [x]  DOE  Vascular: []  pain in legs while walking-denies-legs feel better when walking []  pain in legs at rest  []  pain in legs at night [x]  non-healing ulcers []  hx of DVT []  swelling in legs  Pulmonary: []  productive cough []  asthma/wheezing []  home O2  Neurologic: []  weakness in []  arms []  legs []  numbness in []  arms []  legs []  hx of CVA []  mini stroke [] difficulty speaking or slurred speech []  temporary loss of vision in one eye  Hematologic: [x]  on plavix  Endocrine:   [x]  diabetes []  thyroid disease  GI [x]  hx of hiatal hernia [x]  GERD   GU: [x]  CKD/renal failure [x]  HD--[]  M/W/F or [x]  T/T/S  Psychiatric: [x]  anxiety [x]  depression  Musculoskeletal: [x]  arthritis  Integumentary: []  rashes [x]  ulcers  Constitutional: []  fever [x]  chills   Physical Examination  Vitals:   12/01/18 1516 12/01/18 1600  BP: 133/74 (!) 157/69  Pulse: 71 72  Resp: (!) 22 (!) 22  Temp:    SpO2: 99% 99%   Body mass index is 31.65 kg/m.  General:  WDWN in NAD Gait: Not observed HENT: WNL, normocephalic Pulmonary: normal non-labored breathing, with wheezing Cardiac: regularwith left carotid bruit Abdomen:  soft, NT/ND, no masses Skin: without rashes Vascular Exam/Pulses:  Right Left  Radial 2+ (normal) Unable to palpate   Femoral Unable to palpate due to body habitus and position Unable to palpate due to body habitus and position  Popliteal Unable to palpate  Unable to palpate   DP Brisk doppler signal absent  PT Brisk doppler signal absent   Extremities:      There is a small heel ulcer on the left   Musculoskeletal: no muscle wasting or atrophy  Neurologic: A&O X 3;  No focal weakness or paresthesias are detected; speech is fluent/normal Psychiatric:  The pt has flat affect.   CBC    Component Value Date/Time   WBC  14.8 (H) 12/01/2018 1414   RBC 2.80 (L) 12/01/2018 1414   HGB 8.4 (L) 12/01/2018 1414   HGB 11.5 (L) 02/08/2017 1409   HCT 27.6 (L) 12/01/2018 1414   HCT 34.3 (L) 02/08/2017 1409   PLT 333 12/01/2018 1414   PLT 208 02/08/2017 1409   MCV  98.6 12/01/2018 1414   MCV 94 02/08/2017 1409   MCH 30.0 12/01/2018 1414   MCHC 30.4 12/01/2018 1414   RDW 15.6 (H) 12/01/2018 1414   RDW 16.7 (H) 02/08/2017 1409   LYMPHSABS 1.3 12/01/2018 1414   LYMPHSABS 2.1 02/08/2017 1409   MONOABS 1.1 (H) 12/01/2018 1414   EOSABS 0.1 12/01/2018 1414   EOSABS 0.2 02/08/2017 1409   BASOSABS 0.1 12/01/2018 1414   BASOSABS 0.0 02/08/2017 1409    BMET    Component Value Date/Time   NA 136 12/01/2018 1414   NA 134 02/08/2017 1403   K 3.9 12/01/2018 1414   CL 96 (L) 12/01/2018 1414   CO2 25 12/01/2018 1414   GLUCOSE 119 (H) 12/01/2018 1414   BUN 48 (H) 12/01/2018 1414   BUN 67 (H) 02/08/2017 1403   CREATININE 9.30 (H) 12/01/2018 1414   CALCIUM 9.2 12/01/2018 1414   GFRNONAA 5 (L) 12/01/2018 1414   GFRAA 6 (L) 12/01/2018 1414    COAGS: Lab Results  Component Value Date   INR 1.1 02/08/2017   INR 0.94 11/27/2010   INR 0.97 08/24/2010     Non-Invasive Vascular Imaging:   Right foot xray 12/01/2018: IMPRESSION: Soft tissue swelling with subcutaneous gas, concerning for necrotizing infection.  Atherosclerosis.  Degenerative changes of the foot.  CXR 12/01/2018: IMPRESSION: Mild cardiac enlargement. No edema or consolidation. Aortic Atherosclerosis (ICD10-I70.0).   ASSESSMENT/PLAN: This is a 68 y.o. male with ESRD, DM with ischemic right foot  -pt with ischemic right foot and xray with subcutaneous gas concerning for necrotizing infection.  Will proceed emergently to the OR for right below knee amputation.  Pt had a sip of sprite about an hour ago, but has not had anything else to eat or drink.   -left carotid bruit- will need carotid duplex to evaluate.  Denies any sx of stroke.     Leontine Locket, PA-C [facility] [phone]  Agree with above.  Gas gangrene right foot.  He will need emergent BKA today.  Has chronic ulcer left foot.  Prior SFA/pop atherectomy Dr Gwenlyn Found will need close monitoring to  make sure it does not worsen. 02/2017. Will get duplex and ABIs while in hospital. Also carotid duplex for asymptomatic bruit neck.  [name], MD [facility] [place] Office: [phone] Pager: [phone]

## 2018-12-01 NOTE — ED Notes (Signed)
Pt reports that he urinates approximately every 4 days due to dialysis, states he will be unlikely to provide urine sample today since he last voided this am

## 2018-12-01 NOTE — Transfer of Care (Signed)
Immediate Anesthesia Transfer of Care Note  Patient: [name]  Procedure(s) Performed: RIGHT AMPUTATION BELOW KNEE (Right Leg Lower)  Patient Location: PACU  Anesthesia Type:General  Level of Consciousness: sedated and responds to stimulation  Airway & Oxygen Therapy: Patient Spontanous Breathing and Patient connected to face mask oxygen  Post-op Assessment: Report given to RN and Post -op Vital signs reviewed and stable  Post vital signs: Reviewed and stable  Last Vitals:  Vitals Value Taken Time  BP    Temp    Pulse    Resp    SpO2      Last Pain:  Vitals:   12/01/18 1325  TempSrc:   PainSc: 3          Complications: No apparent anesthesia complications

## 2018-12-01 NOTE — Op Note (Signed)
Procedure: Right below-knee amputation  Preoperative diagnosis: Gangrene right foot  Postoperative diagnosis: Same  Anesthesia Gen.  Assistant: Leontine Locket PA-C  Upper findings: #1 calcified vessels with reasonably well perfused muscle tissue  Operative details: After obtaining informed consent, the patient was taken to the operating room. The patient was placed in supine position on the operating table. After induction of general anesthesia and endotracheal intubation, the patient's entire right lower extremity was prepped and draped all the way down to the level of the ankle. Next a transverse incision was made approximately 4 fingerbreadths below the tibial tuberosity on the right leg.  The  incision was carried posteriorly to the midportion of the leg and then extended longitudinally to create a posterior flap. The subcutaneous tissues and fascia was taken down with cautery. Periosteum was raised on the tibia approximately 5 cm above the skin edge.  The periosteum was also raised on the fibula several centimeters above this. The tibia was then divided with a saw. The fibula was divided with a bone cutter. The leg was then elevated in the operative field and the amputation was completed posterior to the bones with an amputation knife. Hemostasis was then obtained with cautery and several suture ligatures. The tibial and sural nerves were pulled down into the field transected and allowed to retract up into the leg.  There was a lot of venous bleeding/oozing  that had to be controlled with figure of 8 silk sutures and cautery.  After hemostasis was obtained, the wound was thoroughly irrigated with  normal saline solution. The fascial edges were then reapproximated with interrupted 2-0 Vicryl sutures. Subcutaneous tissues were reapproximated using running 3-0 Vicryl suture. The skin was closed with staples. The patient tolerated the procedure well and there were no complications. Instrument sponge and   needle counts were correct at the end of the case. The patient was taken to the recovery room in stable condition.  [name], MD [facility] [place] Office: [phone] Pager: [phone]

## 2018-12-01 NOTE — ED Triage Notes (Addendum)
Pt arrives POV for a wound to the right foot. Pt reports hospitalized last week for it, received ABX and d/c'ed with ABX prescription. Pt reports he has been taking, but wound getting worse. Pt T, Th, Sat dialysis and has not missed any. Left side restricted. Pt sent here by podiatrist. Right foot has wound to bottom and toes, black in color.

## 2018-12-01 NOTE — ED Notes (Signed)
ED Provider at bedside. 

## 2018-12-01 NOTE — ED Provider Notes (Signed)
Patient is a 68 year old male who presents with worsening gangrene of his right foot.  He has a history of peripheral arterial disease and has had a prior procedure done on that leg by Dr. Alvester Chou.  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  I am unable to elicit pulses in the foot.  I was able to Doppler a popliteal pulse.  He is got significant gangrene of the right foot.  There is some concern on x-ray for necrotizing fasciitis.  He was started on Zosyn and vancomycin.  I spoke with Dr. [name] with vascular surgery who will see the patient.   Malvin Johns, MD 12/01/18 [phone]

## 2018-12-01 NOTE — ED Provider Notes (Signed)
[facility] EMERGENCY DEPARTMENT Provider Note   CSN: [id number] Arrival date & time: 12/01/18  1312    History   Chief Complaint Chief Complaint  Patient presents with  . Wound Check    HPI [name] is a 68 y.o. 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 Pt reports that 3 weeks ago he noticed a small laceration to the bottom of his right foot but did not do anything about it. He reports that the wound has grown larger in size since then. He was seen in the [facility] ED last week for same; had x ray done with no findings of osteomyelitis and was treated with Keflex. Pt then saw his podiatrist in [place] who sent him to another specialist in [place] who sent pt to the ED today. Pt states he has been complaint with antibiotics as well as all other home meds including his diabetic medication. He also currently complains of chills. Denies fevers.        Past Medical History:  Diagnosis Date  . Anemia   . Anxiety   . Arthritis   . Chronic kidney disease   . COPD (chronic obstructive pulmonary disease) ([place])    Pt denies  . Depression   . Diabetes mellitus without complication ([place])   . GERD (gastroesophageal reflux disease)   . H/O hiatal hernia   . Headache(784.0)    Hx: Migraines  . Hypertension   . Neuropathy   . Numbness of toes    toes and feet  . Pneumonia    Hx: of several times  . Renal insufficiency   . Shortness of breath dyspnea   . Type 2 diabetes mellitus [facility])     Patient Active Problem List   Diagnosis Date Noted  . PAD (peripheral artery disease) ([place]) 02/12/2017  . Critical lower limb ischemia 02/11/2017  . Acute respiratory failure with hypoxia ([place]) 08/28/2015  . Hypertensive urgency 08/28/2015  . Chronic anemia 08/28/2015  . ESRD on dialysis ([place]) 08/28/2015  . Diabetes mellitus with end stage renal disease ([place]) 08/28/2015  . PVD (peripheral  vascular disease) ([place]) 07/15/2014  . Peripheral vascular disease, unspecified ([place]) 01/14/2014  . Pain in limb-left arm 04/09/2013  . Guaiac + stool 04/07/2013  . Anemia 04/07/2013  . Dysphagia, unspecified(787.20) 04/07/2013  . Numbness and tingling-left arm  02/04/2013  . Cold hands and feet-left arm 02/04/2013  . Other complications due to renal dialysis device, implant, and graft 06/06/2012  . End stage renal disease ([place]) 02/05/2012  . Injury to blood vessels, unspecified site 02/05/2012  . Pain in joint, shoulder region 09/14/2011    Past Surgical History:  Procedure Laterality Date  . AV FISTULA PLACEMENT  2012      left arm   . AV FISTULA PLACEMENT Left 12/18/2012   Procedure: ARTERIOVENOUS (AV) FISTULA CREATION;  Surgeon: Angelia Mould, MD;  Location: [place];  Service: Vascular;  Laterality: Left;  . COLONOSCOPY  10/26/2011   Procedure: COLONOSCOPY;  Surgeon: Rogene Houston, MD;  [facility];  Service: Endoscopy;  Laterality: N/A;  730  . EMBOLECTOMY Left 12/09/2012   Procedure: EMBOLECTOMY;  Surgeon: Serafina Mitchell, MD;  Location: [facility] CATH LAB;  Service: Cardiovascular;  Laterality: Left;  left arm venous embolization  . ESOPHAGOGASTRODUODENOSCOPY (EGD) WITH ESOPHAGEAL DILATION N/A 04/23/2013   Procedure: ESOPHAGOGASTRODUODENOSCOPY (EGD) WITH ESOPHAGEAL DILATION;  Surgeon:  Rogene Houston, MD;  [facility];  Service: Endoscopy;  Laterality: N/A;  200-moved to 930   . FISTULA SUPERFICIALIZATION Left 06/18/2013   Procedure: FISTULA SUPERFICIALIZATION & LIGATION BRANCH X 1;  Surgeon: Mal Misty, MD;  Location: [place];  Service: Vascular;  Laterality: Left;  . HEMATOMA EVACUATION Right 02/11/2017   Procedure: EVACUATION HEMATOMA RIGHT GROIN, Repair of Right Pseudo-anerysm.;  Surgeon: [name], MD;  Location: [facility] OR;  Service: Vascular;  Laterality: Right;  . INGUINAL HERNIA REPAIR     ,  times   2  . INSERTION OF DIALYSIS CATHETER Left  12/18/2012   Procedure: INSERTION OF DIALYSIS CATHETER;  Surgeon: Angelia Mould, MD;  Location: [place];  Service: Vascular;  Laterality: Left;  . KNEE ARTHROSCOPY  2011   Right Knee  . LOWER EXTREMITY ANGIOGRAPHY N/A 02/11/2017   Procedure: Lower Extremity Angiography;  Surgeon: Lorretta Harp, MD;  Location: [place] CV LAB;  Service: Cardiovascular;  Laterality: N/A;  . PERIPHERAL ATHRECTOMY  02/11/2017  . PERIPHERAL VASCULAR ATHERECTOMY Left 02/11/2017   Procedure: Peripheral Vascular Atherectomy;  Surgeon: Lorretta Harp, MD;  Location: [place] CV LAB;  Service: Cardiovascular;  Laterality: Left;  . REVISON OF ARTERIOVENOUS FISTULA Left 09/23/1550   Procedure: PLICATION OF LEFT BRACHIOCEPHALIC ARTERIOVENOUS FISTULA;  Surgeon: Conrad [place] Lake, MD;  Location: [place];  Service: Vascular;  Laterality: Left;  . SHUNTOGRAM N/A 12/09/2012   Procedure: fistulogram;  Surgeon: Serafina Mitchell, MD;  Location: [facility] CATH LAB;  Service: Cardiovascular;  Laterality: N/A;  . SHUNTOGRAM Left 06/03/2013   Procedure: Fistulogram;  Surgeon: Serafina Mitchell, MD;  Location: [facility] CATH LAB;  Service: Cardiovascular;  Laterality: Left;  . THROMBECTOMY W/ EMBOLECTOMY Left 12/11/2012   Procedure: THROMBECTOMY ARTERIOVENOUS FISTULA;  Surgeon: Serafina Mitchell, MD;  Location: [place];  Service: Vascular;  Laterality: Left;  . TONSILLECTOMY          Home Medications    Prior to Admission medications   Medication Sig Start Date End Date Taking? Authorizing Provider  ARTIFICIAL TEAR OP Place 1 drop into both eyes every 6 (six) hours as needed (dry eyes).    [provider]  atorvastatin (LIPITOR) 80 MG tablet Take 80 mg by mouth at bedtime. 08/10/15   [provider]  calcium acetate (PHOSLO) 667 MG capsule Take 2 capsules (1,334 mg total) by mouth 3 (three) times daily with meals. 02/17/17   Strader, Fransisco Hertz, PA-C  cinacalcet (SENSIPAR) 30 MG tablet Take 30 mg by mouth every evening.     [provider]  levothyroxine (SYNTHROID, LEVOTHROID) 125 MCG tablet Take 125 mcg by mouth daily before breakfast.    [provider]  lisinopril (PRINIVIL,ZESTRIL) 10 MG tablet Take 10 mg by mouth daily.    [provider]  LYRICA 50 MG capsule Take 1 tablet by mouth 3 (three) times daily. 02/04/17   [provider]  nortriptyline (PAMELOR) 25 MG capsule Take 50 mg by mouth at bedtime.  01/23/17   [provider]  polyethylene glycol (MIRALAX / GLYCOLAX) packet Take 17 g by mouth daily.    [provider]  risperiDONE (RISPERDAL) 0.25 MG tablet Take 0.25 mg by mouth daily.    [provider]  SALINE NASAL SPRAY NA Place 2 sprays into both nostrils as needed (seasonal allergies).    [provider]  TOUJEO SOLOSTAR 300 UNIT/ML SOPN Inject 36 Units as directed at bedtime. 02/01/17  [provider]  traZODone (DESYREL) 50 MG tablet Take 50 mg by mouth daily.  02/04/17   [provider]    Family History Family History  Problem Relation Age of Onset  . Heart disease Father        Heart Disease before age 20    Social History Social History   Tobacco Use  . Smoking status: Former Smoker    Years: 10.00    Types: Cigars    Last attempt to quit: 03/13/2012    Years since quitting: 6.7  . Smokeless tobacco: Never Used  Substance Use Topics  . Alcohol use: No    Alcohol/week: 0.0 standard drinks  . Drug use: No     Allergies   Morphine and related and Zaroxolyn [metolazone]   Review of Systems Review of Systems  Constitutional: Positive for chills. Negative for fever.  HENT: Negative for congestion.   Eyes: Negative for redness.  Respiratory: Positive for wheezing. Negative for shortness of breath.   Cardiovascular: Negative for chest pain.  Gastrointestinal: Negative for nausea and vomiting.  Musculoskeletal: Positive for arthralgias.  Skin: Positive for wound.  Allergic/Immunologic:  Positive for immunocompromised state.  Neurological: Negative for weakness and numbness.     Physical Exam Updated Vital Signs BP 133/72   Pulse 71   Temp 98 F (36.7 C) (Oral)   Resp 11   Ht 6\' 4"  (1.93 m)   Wt 117.9 kg   SpO2 99%   BMI 31.65 kg/m   Physical Exam Vitals signs and nursing note reviewed.  Constitutional:      Appearance: He is not ill-appearing.  HENT:     Head: Normocephalic and atraumatic.  Eyes:     Conjunctiva/sclera: Conjunctivae normal.  Neck:     Musculoskeletal: Neck supple.  Cardiovascular:     Rate and Rhythm: Normal rate and regular rhythm.  Pulmonary:     Effort: Pulmonary effort is normal.     Breath sounds: Normal breath sounds.  Abdominal:     Palpations: Abdomen is soft.     Tenderness: There is no abdominal tenderness.  Musculoskeletal:     Comments: See photos below for right foot. Unable to palpate DP or PT pulse. Doppler at bedside unable to find DP or PT pulse; strong popliteal pulse.   Skin:    General: Skin is warm and dry.  Neurological:     Mental Status: He is alert.            ED Treatments / Results  Labs (all labs ordered are listed, but only abnormal results are displayed) Labs Reviewed  BASIC METABOLIC PANEL - Abnormal; Notable for the following components:      Result Value   Chloride 96 (*)    Glucose, Bld 119 (*)    BUN 48 (*)    Creatinine, Ser 9.30 (*)    GFR calc non Af Amer 5 (*)    GFR calc Af Amer 6 (*)    All other components within normal limits  CBC WITH DIFFERENTIAL/PLATELET - Abnormal; Notable for the following components:   WBC 14.8 (*)    RBC 2.80 (*)    Hemoglobin 8.4 (*)    HCT 27.6 (*)    RDW 15.6 (*)    Neutro Abs 12.0 (*)    Monocytes Absolute 1.1 (*)    Abs Immature Granulocytes 0.19 (*)    All other components within normal limits  CBG MONITORING, ED - Abnormal; Notable for the following components:  Glucose-Capillary 105 (*)    All other components within normal limits   CULTURE, BLOOD (ROUTINE X 2)  CULTURE, BLOOD (ROUTINE X 2)  LACTIC ACID, PLASMA  URINALYSIS, ROUTINE W REFLEX MICROSCOPIC  LACTIC ACID, PLASMA    EKG None  Radiology Dg Chest Port 1 View  Result Date: 12/01/2018 CLINICAL DATA:  Wheezing EXAM: PORTABLE CHEST 1 VIEW COMPARISON:  February 11, 2017. FINDINGS: There is no edema or consolidation. Heart is mildly enlarged with pulmonary vascularity normal. No adenopathy. There is aortic atherosclerosis. No evident bone lesions. IMPRESSION: Mild cardiac enlargement. No edema or consolidation. Aortic Atherosclerosis (ICD10-I70.0). Electronically Signed   By: Lowella Grip III M.D.   On: 12/01/2018 14:13   Dg Foot Complete Right  Result Date: 12/01/2018 CLINICAL DATA:  68 year old male with a history of foot wound EXAM: RIGHT FOOT COMPLETE - 3+ VIEW COMPARISON:  None. FINDINGS: Soft tissue swelling with subcutaneous gas of the forefoot, predominantly within the plantar soft tissues. No displaced fracture. Degenerative changes of the forefoot and midfoot. Degenerative changes of the hindfoot with enthesopathic changes at the Achilles insertion and plantar fascia insertion. Dense calcifications of the tibial plantar arteries. IMPRESSION: Soft tissue swelling with subcutaneous gas, concerning for necrotizing infection. Atherosclerosis. Degenerative changes of the foot. Electronically Signed   By: Corrie Mckusick D.O.   On: 12/01/2018 14:28    Procedures .Critical Care Performed by: Eustaquio Maize, PA-C Authorized by: Eustaquio Maize, PA-C   Critical care provider statement:    Critical care time (minutes):  40   Critical care was necessary to treat or prevent imminent or life-threatening deterioration of the following conditions: Arterial occlusion.   Critical care was time spent personally by me on the following activities:  Discussions with consultants, evaluation of patient's response to treatment, examination of patient, ordering and performing  treatments and interventions, ordering and review of laboratory studies, ordering and review of radiographic studies, pulse oximetry, re-evaluation of patient's condition, obtaining history from patient or surrogate and review of old charts   (including critical care time)  Medications Ordered in ED Medications  vancomycin (VANCOCIN) IVPB 1000 mg/200 mL premix (1,000 mg Intravenous Transfusing/Transfer 12/01/18 1652)  ceFAZolin (ANCEF) IVPB 2g/100 mL premix (has no administration in time range)  piperacillin-tazobactam (ZOSYN) IVPB 3.375 g (0 g Intravenous Stopped 12/01/18 1625)     Initial Impression / Assessment and Plan / ED Course  I have reviewed the triage vital signs and the nursing notes.  Pertinent labs & imaging results that were available during my care of the patient were reviewed by me and considered in my medical decision making (see chart for details).    Pt with necrotic appearing right foot without DP or PT pulse on doppler. Popliteal pulse intact. X ray ordered as well as baseline labs, lactic acid, and blood culture. Will consult vascular surgery.   X ray with findings with subcutaneous gas, concerning for infection. Leukocytosis of 14.8 consistent with infectious process. Pt started on Vanc and Zosyn prior to vascular seeing them.   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         Final Clinical Impressions(s) / ED Diagnoses   Final diagnoses:  Necrotizing fasciitis of ankle and foot [facility])  Arterial occlusion    ED Discharge Orders    None       Eustaquio Maize, PA-C 12/01/18 1854    Malvin Johns, MD 12/03/18 [phone]

## 2018-12-01 NOTE — Anesthesia Preprocedure Evaluation (Addendum)
Anesthesia Evaluation  Patient identified by MRN, date of birth, ID band Patient awake    Reviewed: Allergy & Precautions, H&P , NPO status , Patient's Chart, lab work & pertinent test results  Airway Mallampati: II   Neck ROM: full    Dental   Pulmonary COPD, former smoker,    breath sounds clear to auscultation       Cardiovascular hypertension, Pt. on medications + Peripheral Vascular Disease   Rhythm:regular Rate:Normal     Neuro/Psych  Headaches, PSYCHIATRIC DISORDERS Anxiety Depression    GI/Hepatic Neg liver ROS, hiatal hernia, GERD  ,  Endo/Other  diabetesHypothyroidism   Renal/GU ESRF and DialysisRenal disease     Musculoskeletal negative musculoskeletal ROS (+)   Abdominal (+) + obese,   Peds  Hematology  (+) Blood dyscrasia, anemia , HLD   Anesthesia Other Findings Right leg wound   Reproductive/Obstetrics                            Anesthesia Physical Anesthesia Plan  ASA: III and emergent  Anesthesia Plan: General   Post-op Pain Management:    Induction: Intravenous and Rapid sequence  PONV Risk Score and Plan: 3 and Ondansetron, Dexamethasone, Midazolam and Treatment may vary due to age or medical condition  Airway Management Planned: Oral ETT  Additional Equipment:   Intra-op Plan:   Post-operative Plan: Extubation in OR  Informed Consent: I have reviewed the patients History and Physical, chart, labs and discussed the procedure including the risks, benefits and alternatives for the proposed anesthesia with the patient or authorized representative who has indicated his/her understanding and acceptance.     Dental advisory given  Plan Discussed with: CRNA, Anesthesiologist and Surgeon  Anesthesia Plan Comments:        Anesthesia Quick Evaluation

## 2018-12-01 NOTE — ED Notes (Signed)
Vascular at bedside

## 2018-12-01 NOTE — ED Notes (Signed)
XR at bedside

## 2018-12-01 NOTE — Anesthesia Procedure Notes (Signed)
Procedure Name: Intubation Date/Time: 12/08/2018 7:29 PM Performed by: Eligha Bridegroom, CRNA Pre-anesthesia Checklist: Patient identified, Emergency Drugs available, Suction available, Patient being monitored and Timeout performed Patient Re-evaluated:Patient Re-evaluated prior to induction Oxygen Delivery Method: Circle system utilized Preoxygenation: Pre-oxygenation with 100% oxygen Induction Type: IV induction Grade View: Grade II Tube type: Oral Tube size: 7.5 mm Airway Equipment and Method: Stylet Placement Confirmation: positive ETCO2,  breath sounds checked- equal and bilateral and ETT inserted through vocal cords under direct vision Secured at: 22 cm Dental Injury: Teeth and Oropharynx as per pre-operative assessment

## 2018-12-02 ENCOUNTER — Inpatient Hospital Stay (HOSPITAL_COMMUNITY): Payer: Medicare Other

## 2018-12-02 ENCOUNTER — Encounter (HOSPITAL_COMMUNITY): Payer: Self-pay | Admitting: Vascular Surgery

## 2018-12-02 DIAGNOSIS — R0989 Other specified symptoms and signs involving the circulatory and respiratory systems: Secondary | ICD-10-CM

## 2018-12-02 LAB — GLUCOSE, CAPILLARY
Glucose-Capillary: 145 mg/dL — ABNORMAL HIGH (ref 70–99)
Glucose-Capillary: 182 mg/dL — ABNORMAL HIGH (ref 70–99)
Glucose-Capillary: 159 mg/dL — ABNORMAL HIGH (ref 70–99)
Glucose-Capillary: 239 mg/dL — ABNORMAL HIGH (ref 70–99)

## 2018-12-02 LAB — BASIC METABOLIC PANEL
Anion gap: 21 — ABNORMAL HIGH (ref 5–15)
BUN: 58 mg/dL — ABNORMAL HIGH (ref 8–23)
CO2: 21 mmol/L — ABNORMAL LOW (ref 22–32)
Calcium: 8.5 mg/dL — ABNORMAL LOW (ref 8.9–10.3)
Chloride: 94 mmol/L — ABNORMAL LOW (ref 98–111)
Creatinine, Ser: 10.18 mg/dL — ABNORMAL HIGH (ref 0.61–1.24)
GFR calc Af Amer: 5 mL/min — ABNORMAL LOW (ref >60)
GFR calc non Af Amer: 5 mL/min — ABNORMAL LOW (ref >60)
Glucose, Bld: 144 mg/dL — ABNORMAL HIGH (ref 70–99)
Potassium: 4.7 mmol/L (ref 3.5–5.1)
Sodium: 136 mmol/L (ref 135–145)

## 2018-12-02 LAB — PREPARE RBC (CROSSMATCH)

## 2018-12-02 LAB — CBC
HCT: 27.6 % — ABNORMAL LOW (ref 39.0–52.0)
Hemoglobin: 8.7 g/dL — ABNORMAL LOW (ref 13.0–17.0)
MCH: 30 pg (ref 26.0–34.0)
MCHC: 31.5 g/dL (ref 30.0–36.0)
MCV: 95.2 fL (ref 80.0–100.0)
Platelets: 311 10*3/uL (ref 150–400)
RBC: 2.9 MIL/uL — ABNORMAL LOW (ref 4.22–5.81)
RDW: 17.6 % — ABNORMAL HIGH (ref 11.5–15.5)
WBC: 11.2 10*3/uL — ABNORMAL HIGH (ref 4.0–10.5)
nRBC: 0 % (ref 0.0–0.2)

## 2018-12-02 LAB — HEPATITIS B SURFACE ANTIGEN: Hepatitis B Surface Ag: NEGATIVE

## 2018-12-02 MED ORDER — HYDROMORPHONE HCL 1 MG/ML IJ SOLN
INTRAMUSCULAR | Status: AC
  Administered 2018-12-02: 0.5 mg via INTRAVENOUS
  Filled 2018-12-02: qty 0.5

## 2018-12-02 MED ORDER — CALCIUM ACETATE (PHOS BINDER) 667 MG PO CAPS
2668.0000 mg | ORAL_CAPSULE | Freq: Three times a day (TID) | ORAL | Status: DC
  Administered 2018-12-02 – 2018-12-03 (×3): 2668 mg via ORAL
  Filled 2018-12-02 (×5): qty 4

## 2018-12-02 MED ORDER — DOXERCALCIFEROL 4 MCG/2ML IV SOLN
INTRAVENOUS | Status: AC
  Administered 2018-12-02: 0.5 ug via INTRAVENOUS
  Filled 2018-12-02: qty 2

## 2018-12-02 MED ORDER — CHLORHEXIDINE GLUCONATE CLOTH 2 % EX PADS: 6.0000 | MEDICATED_PAD | Freq: Every day | CUTANEOUS | Status: DC

## 2018-12-02 MED ORDER — LIDOCAINE HCL (PF) 1 % IJ SOLN: 5.0000 mL | INTRAMUSCULAR | Status: DC | PRN

## 2018-12-02 MED ORDER — PENTAFLUOROPROP-TETRAFLUOROETH EX AERO: 1.0000 "application " | INHALATION_SPRAY | CUTANEOUS | Status: DC | PRN

## 2018-12-02 MED ORDER — SODIUM CHLORIDE 0.9 % IV SOLN: 100.0000 mL | INTRAVENOUS | Status: DC | PRN

## 2018-12-02 MED ORDER — LIDOCAINE-PRILOCAINE 2.5-2.5 % EX CREA: 1.0000 "application " | TOPICAL_CREAM | CUTANEOUS | Status: DC | PRN

## 2018-12-02 MED ORDER — DOXERCALCIFEROL 4 MCG/2ML IV SOLN
0.5000 ug | INTRAVENOUS | Status: DC
  Administered 2018-12-02: 14:00:00 0.5 ug via INTRAVENOUS
  Filled 2018-12-02: qty 2

## 2018-12-02 MED ORDER — ALTEPLASE 2 MG IJ SOLR: 2.0000 mg | Freq: Once | INTRAMUSCULAR | Status: DC | PRN

## 2018-12-02 NOTE — Consult Note (Addendum)
[facility] Renal Consultation Note    Indication for Consultation:  Management of ESRD/hemodialysis, anemia, hypertension/volume, and secondary hyperparathyroidism. PCP:  HPI: [name] is a 68 y.o. male with a past medical history including ESRD on dialysis since 2014, diabetes mellitus, HTN, COPD, anemia who presented to the ED yesterday with R foot wound. Patient reports he had a small laceration on his right foot which worsened rapidly. He was treated with keflex as an outpatient but was then sent to the ED yesterday for worsening wound. 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 He also reports a chronic left foot ulcer. On presentation to the ED, temp 53F, BP 133/72, Sp02 99%. Labs include hemoglobin 8.7, K 4.3, Ca 9.2, WBC 14.8, negative COVID-19 and pending blood cultures. Chest x-ray revealed mild cardiac enlargement. No edema or consolidation.  Patient receives dialysis at [facility] in [place]. Normally dialyzes TTS with last treatment on 11/29/2018. Reports good compliance with his treatments. He has been on dialysis since 2014 which he believes was caused by diabetes and possible AKI due to dehydration. He has a LUE AVF by Dr. Scot Dock in 2014 and denies any recent complications with the access. Reports a dry weight of 119.5 kg as an outpatient but current weight is 117.6kg. He says he has had trouble with his phosphorus but has had improved control lately and is on calcium acetate binders. Also reports wheezing but states this has been present for a long time and is not worsening. Denies peripheral edema. He denies any SOB, dyspnea, cough, chest pain, fever, chills.     Past Medical History:  Diagnosis Date  . Anemia   . Anxiety   . Arthritis   . Chronic kidney disease   . COPD (chronic obstructive pulmonary disease) ([place])    Pt denies  . Depression   . Diabetes mellitus without complication ([place])   . GERD  (gastroesophageal reflux disease)   . H/O hiatal hernia   . Headache(784.0)    Hx: Migraines  . Hypertension   . Neuropathy   . Numbness of toes    toes and feet  . Pneumonia    Hx: of several times  . Renal insufficiency   . Shortness of breath dyspnea   . Type 2 diabetes mellitus ([place])    Past Surgical History:  Procedure Laterality Date  . AMPUTATION Right 12/01/2018   Procedure: RIGHT AMPUTATION BELOW KNEE;  Surgeon: [name], MD;  Location: [facility] OR;  Service: Vascular;  Laterality: Right;  . AV FISTULA PLACEMENT  2012      left arm   . AV FISTULA PLACEMENT Left 12/18/2012   Procedure: ARTERIOVENOUS (AV) FISTULA CREATION;  Surgeon: Angelia Mould, MD;  Location: [place];  Service: Vascular;  Laterality: Left;  . COLONOSCOPY  10/26/2011   Procedure: COLONOSCOPY;  Surgeon: Rogene Houston, MD;  Location: [facility] ENDO SU[facility];  Service: Endoscopy;  Laterality: N/A;  730  . EMBOLECTOMY Left 12/09/2012   Procedure: EMBOLECTOMY;  Surgeon: Serafina Mitchell, MD;  Location: [facility] CATH LAB;  Service: Cardiovascular;  Laterality: Left;  left arm venous embolization  . ESOPHAGOGASTRODUODENOSCOPY (EGD) WITH ESOPHAGEAL DILATION N/A 04/23/2013   Procedure: ESOPHAGOGASTRODUODENOSCOPY (EGD) WITH ESOPHAGEAL DILATION;  Surgeon: Rogene Houston, MD;  [facility];  Service: Endoscopy;  Laterality: N/A;  200-moved to 930   . FISTULA SUPERFICIALIZATION Left 06/18/2013   Procedure: FISTULA SUPERFICIALIZATION & LIGATION BRANCH X 1;  Surgeon:  Mal Misty, MD;  Location: [place];  Service: Vascular;  Laterality: Left;  . HEMATOMA EVACUATION Right 02/11/2017   Procedure: EVACUATION HEMATOMA RIGHT GROIN, Repair of Right Pseudo-anerysm.;  Surgeon: [name], MD;  Location: [facility] OR;  Service: Vascular;  Laterality: Right;  . INGUINAL HERNIA REPAIR     ,  times   2  . INSERTION OF DIALYSIS CATHETER Left 12/18/2012   Procedure: INSERTION OF DIALYSIS CATHETER;  Surgeon: Angelia Mould, MD;   Location: [place];  Service: Vascular;  Laterality: Left;  . KNEE ARTHROSCOPY  2011   Right Knee  . LOWER EXTREMITY ANGIOGRAPHY N/A 02/11/2017   Procedure: Lower Extremity Angiography;  Surgeon: Lorretta Harp, MD;  Location: [place] CV LAB;  Service: Cardiovascular;  Laterality: N/A;  . PERIPHERAL ATHRECTOMY  02/11/2017  . PERIPHERAL VASCULAR ATHERECTOMY Left 02/11/2017   Procedure: Peripheral Vascular Atherectomy;  Surgeon: Lorretta Harp, MD;  Location: [place] CV LAB;  Service: Cardiovascular;  Laterality: Left;  . REVISON OF ARTERIOVENOUS FISTULA Left 9/32/3557   Procedure: PLICATION OF LEFT BRACHIOCEPHALIC ARTERIOVENOUS FISTULA;  Surgeon: Conrad Rougemont, MD;  Location: [place];  Service: Vascular;  Laterality: Left;  . SHUNTOGRAM N/A 12/09/2012   Procedure: fistulogram;  Surgeon: Serafina Mitchell, MD;  Location: [facility] CATH LAB;  Service: Cardiovascular;  Laterality: N/A;  . SHUNTOGRAM Left 06/03/2013   Procedure: Fistulogram;  Surgeon: Serafina Mitchell, MD;  Location: [facility] CATH LAB;  Service: Cardiovascular;  Laterality: Left;  . THROMBECTOMY W/ EMBOLECTOMY Left 12/11/2012   Procedure: THROMBECTOMY ARTERIOVENOUS FISTULA;  Surgeon: Serafina Mitchell, MD;  Location: [place];  Service: Vascular;  Laterality: Left;  . TONSILLECTOMY     Family History  Problem Relation Age of Onset  . Heart disease Father        Heart Disease before age 11   Social History:  reports that he quit smoking about 6 years ago. His smoking use included cigars. He quit after 10.00 years of use. He has never used smokeless tobacco. He reports that he does not drink alcohol or use drugs.   Review of Systems: Gen: Tired this AM. Denies any fever, chills, fatigue, malaise CV: Denies chest pain, angina, palpitations, orthopnea, peripheral edeman. Resp: Reports wheezing. Denies dyspnea at rest, cough, sputum. GI: Denies nausea, vomiting, abdominal pain, constipation or diarrhea. MS: R leg pain s/p BKA Derm: Chronic wounds  b/l feet Heme: Denies bruising, bleeding Neuro: No headache, dizziness, or weakness. Endocrine: + diabetes mellitus and hypothyroidism  Physical Exam: Vitals:   12/02/18 0315 12/02/18 0534 12/02/18 0717 12/02/18 0827  BP: 114/65 114/67 117/65   Pulse: 66 64 61 66  Resp: 13 12 11 17   Temp: (!) 97.5 F (36.4 C) 97.7 F (36.5 C) (!) 97.5 F (36.4 C)   TempSrc: Oral Oral Oral   SpO2: 100% 100% 99% 100%  Weight:      Height:         General: Well developed, well nourished, in no acute distress. Sitting in bed, eating breakfast Head: Normocephalic, atraumatic, sclera non-icteric, mucus membranes are moist. Neck: JVD not elevated. Lungs: Breathing is unlabored. Diffuse wheezing bilaterally. No rhonchi or rales auscultated Heart: RRR with normal S1, S2. No murmurs, rubs, or gallops appreciated. Abdomen: Soft, non-tender, non-distended with normoactive bowel sounds. No rebound/guarding. No obvious abdominal masses.  Lower extremities: No edema. R BKA, currently wrapped in bandage Neuro: Alert and oriented X 3. Moves all extremities spontaneously. Psych:  Responds to questions appropriately  with a normal affect. Dialysis Access: LUE AVF, + thrill  Allergies  Allergen Reactions  . Morphine And Related Other (See Comments)    Not effective  . Zaroxolyn [Metolazone] Other (See Comments)    Reaction not recalled by the patient   Prior to Admission medications   Medication Sig Start Date End Date Taking? Authorizing Provider  albuterol (VENTOLIN HFA) 108 (90 Base) MCG/ACT inhaler Inhale 2 puffs into the lungs every 6 (six) hours as needed for wheezing or shortness of breath.   Yes [provider]  ARTIFICIAL TEAR OP Place 1 drop into both eyes every 6 (six) hours as needed (dry eyes).   Yes [provider]  atorvastatin (LIPITOR) 80 MG tablet Take 80 mg by mouth at bedtime. 08/10/15  Yes [provider]  calcium acetate (PHOSLO) 667 MG capsule Take 2 capsules  (1,334 mg total) by mouth 3 (three) times daily with meals. Patient taking differently: Take 1,334-2,668 mg by mouth See admin instructions. Take 2,668 mg by mouth three times a day with meals and 1,334 mg with each snack 02/17/17  Yes Strader, [place] M, PA-C  cephALEXin (KEFLEX) 500 MG capsule Take 500 mg by mouth 4 (four) times daily. FOR 10 DAYS 11/29/18 12/07/18 Yes [provider]  cinacalcet (SENSIPAR) 30 MG tablet Take 30 mg by mouth every evening.   Yes [provider]  clopidogrel (PLAVIX) 75 MG tablet Take 75 mg by mouth daily.   Yes [provider]  levothyroxine (SYNTHROID, LEVOTHROID) 125 MCG tablet Take 125 mcg by mouth daily before breakfast.   Yes [provider]  Melatonin 10 MG TABS Take 10 mg by mouth at bedtime.   Yes [provider]  polyethylene glycol (MIRALAX / GLYCOLAX) packet Take 17 g by mouth daily as needed for moderate constipation.    Yes [provider]  pregabalin (LYRICA) 150 MG capsule Take 150 mg by mouth 3 (three) times daily.   Yes [provider]  SALINE NASAL SPRAY NA Place 2 sprays into both nostrils as needed (seasonal allergies).   Yes [provider]  silver sulfADIAZINE (SILVADENE) 1 % cream Apply 1 application topically 2 (two) times daily. TO BOTH FEET   Yes [provider]  TOUJEO SOLOSTAR 300 UNIT/ML SOPN Inject 85 Units as directed at bedtime.  02/01/17  Yes [provider]  nortriptyline (PAMELOR) 25 MG capsule Take 50 mg by mouth at bedtime.  01/23/17   [provider]  risperiDONE (RISPERDAL) 0.25 MG tablet Take 0.25 mg by mouth daily.    [provider]  traZODone (DESYREL) 50 MG tablet Take 50 mg by mouth daily.  02/04/17   [provider]   Current Facility-Administered Medications  Medication Dose Route Frequency Provider Last Rate Last Dose  . acetaminophen (TYLENOL) tablet 325-650 mg  325-650 mg Oral Q4H PRN Rhyne, Samantha J,  PA-C       Or  . acetaminophen (TYLENOL) suppository 325-650 mg  325-650 mg Rectal Q4H PRN Rhyne, Samantha J, PA-C      . albuterol (PROVENTIL) (2.5 MG/3ML) 0.083% nebulizer solution 3 mL  3 mL Inhalation Q6H PRN Rhyne, Samantha J, PA-C      . atorvastatin (LIPITOR) tablet 80 mg  80 mg Oral QHS Rhyne, Samantha J, PA-C   80 mg at 12/02/18 0057  . bisacodyl (DULCOLAX) suppository 10 mg  10 mg Rectal Daily PRN Rhyne, Samantha J, PA-C      . calcium acetate (PHOSLO) capsule 1,334 mg  1,334 mg Oral PRN Rhyne, Hulen Shouts, PA-C      . calcium acetate (PHOSLO) capsule 2,668 mg  2,668 mg Oral TID WC [name], MD   2,668 mg at 12/02/18 2458  . ceFAZolin (ANCEF) IVPB 2g/100 mL premix  2 g Intravenous Q8H Rhyne, Samantha J, PA-C 200 mL/hr at 12/02/18 0206    . cinacalcet (SENSIPAR) tablet 30 mg  30 mg Oral QPM Rhyne, Samantha J, PA-C      . clopidogrel (PLAVIX) tablet 75 mg  75 mg Oral Daily Rhyne, Samantha J, PA-C   75 mg at 12/02/18 0827  . docusate sodium (COLACE) capsule 100 mg  100 mg Oral Daily Rhyne, Samantha J, PA-C      . guaiFENesin-dextromethorphan (ROBITUSSIN DM) 100-10 MG/5ML syrup 15 mL  15 mL Oral Q4H PRN Rhyne, Samantha J, PA-C      . [START ON 12/03/2018] heparin injection 5,000 Units  5,000 Units Subcutaneous Q8H Rhyne, Samantha J, PA-C      . hydrALAZINE (APRESOLINE) injection 5 mg  5 mg Intravenous Q20 Min PRN Rhyne, Samantha J, PA-C      . HYDROmorphone (DILAUDID) injection 0.5 mg  0.5 mg Intravenous Q2H PRN Rhyne, Samantha J, PA-C      . insulin aspart (novoLOG) injection 0-15 Units  0-15 Units Subcutaneous TID WC Rhyne, Samantha J, PA-C   2 Units at 12/02/18 [phone]  . insulin glargine (LANTUS) injection 85 Units  85 Units Subcutaneous QHS Rhyne, Samantha J, PA-C      . labetalol (NORMODYNE) injection 10 mg  10 mg Intravenous Q10 min PRN Rhyne, Samantha J, PA-C      . levothyroxine (SYNTHROID) tablet 125 mcg  125 mcg Oral Q0600 Gabriel Earing, PA-C   125 mcg at 12/02/18 0646  .  magnesium sulfate IVPB 2 g 50 mL  2 g Intravenous Daily PRN Rhyne, Hulen Shouts, PA-C      . Melatonin TABS 9 mg  9 mg Oral QHS Rhyne, Samantha J, PA-C   9 mg at 12/02/18 0057  . metoprolol tartrate (LOPRESSOR) injection 2-5 mg  2-5 mg Intravenous Q2H PRN Rhyne, Samantha J, PA-C      . ondansetron (ZOFRAN) injection 4 mg  4 mg Intravenous Q6H PRN Rhyne, Samantha J, PA-C      . oxyCODONE-acetaminophen (PERCOCET/ROXICET) 5-325 MG per tablet 1-2 tablet  1-2 tablet Oral Q4H PRN Rhyne, Hulen Shouts, PA-C   1 tablet at 12/02/18 0827  . pantoprazole (PROTONIX) EC tablet 40 mg  40 mg Oral Daily Gabriel Earing, PA-C   40 mg at 12/02/18 3382  . phenol (CHLORASEPTIC) mouth spray 1 spray  1 spray Mouth/Throat PRN Rhyne, Samantha J, PA-C      . polyethylene glycol (MIRALAX / GLYCOLAX) packet 17 g  17 g Oral Daily PRN Rhyne, Samantha J, PA-C      . pregabalin (LYRICA) capsule 150 mg  150 mg Oral TID Rhyne, Samantha J, PA-C   150 mg at 12/02/18 0827  . risperiDONE (RISPERDAL) tablet 0.25 mg  0.25 mg Oral Daily Rhyne, Samantha J, PA-C      . traZODone (DESYREL) tablet 50 mg  50 mg Oral Daily Gabriel Earing, PA-C   50 mg at 12/02/18 0827   Labs: Basic Metabolic Panel: Recent Labs  Lab 12/01/18 1414 12/01/18 2045  NA 136 136  K 3.9 4.3  CL 96*  --   CO2 25  --   GLUCOSE 119* 142*  BUN 48*  --  CREATININE 9.30*  --   CALCIUM 9.2  --    CBC: Recent Labs  Lab 12/01/18 1414 12/01/18 2045 12/01/18 2151  WBC 14.8*  --   --   NEUTROABS 12.0*  --   --   HGB 8.4* 12.2* 8.1*  HCT 27.6* 36.0* 25.8*  MCV 98.6  --   --   PLT 333  --   --    CBG: Recent Labs  Lab 12/01/18 1356 12/01/18 1906 12/01/18 2126 12/02/18 0628  GLUCAP 105* 92 130* 145*   Studies/Results: Dg Chest Port 1 View  Result Date: 12/01/2018 CLINICAL DATA:  Wheezing EXAM: PORTABLE CHEST 1 VIEW COMPARISON:  February 11, 2017. FINDINGS: There is no edema or consolidation. Heart is mildly enlarged with pulmonary vascularity normal. No  adenopathy. There is aortic atherosclerosis. No evident bone lesions. IMPRESSION: Mild cardiac enlargement. No edema or consolidation. Aortic Atherosclerosis (ICD10-I70.0). Electronically Signed   By: Lowella Grip III M.D.   On: 12/01/2018 14:13   Dg Foot Complete Right  Result Date: 12/01/2018 CLINICAL DATA:  68 year old male with a history of foot wound EXAM: RIGHT FOOT COMPLETE - 3+ VIEW COMPARISON:  None. FINDINGS: Soft tissue swelling with subcutaneous gas of the forefoot, predominantly within the plantar soft tissues. No displaced fracture. Degenerative changes of the forefoot and midfoot. Degenerative changes of the hindfoot with enthesopathic changes at the Achilles insertion and plantar fascia insertion. Dense calcifications of the tibial plantar arteries. IMPRESSION: Soft tissue swelling with subcutaneous gas, concerning for necrotizing infection. Atherosclerosis. Degenerative changes of the foot. Electronically Signed   By: Corrie Mckusick D.O.   On: 12/01/2018 14:28    Dialysis Orders: Center: [facility]  on TTS . EDW 119.5 kg; Time: 4.5 hr; 2K/2.5Ca BFR 400, DFR 600 Temp 35C Primary nephrologist: Dr. Lowanda Foster  Heparin 2000 unit bolus and 1000 units/hr Epogen 1000 units 3x week Hectorol 0.65mcg IV 3x week Venofer 100 mg IV 3x week  Recent labs 11/25/2018: Hgb 10.0, Ca 9.1, Phos 4.9, Corr Ca 9.2, K 5.1  Assessment/Plan: 1.  Ischemic R foot: S/p emergent amputation. Patient started on vanc and zosyn. Pain currently controlled. Further management per vascular surgery/primary. 2.  ESRD:  Normally TTS schedule, reports compliance with HD. Due for dialysis today. K+ 4.3, will continue 2K bath. Wheezing on exam but not in respiratory distress. Will plan for HD today per regular schedule. No heparin today due to surgery.  3.  Hypertension/volume: Blood pressure well controlled. No edema on exam. + Wheezing but COPD likely contributing. Now below EDW s/p BKA, plan for UFG of 2L and will  need EDW lowered at discharge.  4.  Anemia: Hgb 8.7 post op. Receiving epogen and venofer as outpatient. Will order ESA, holding venofer in the setting of acute infection. 5.  Metabolic bone disease:  Calcium 9.2, albumin pending. Reports better control of phosphorus lately. Continue phoslo binder, sensipar, hectorol.  6.  Nutrition:  Albumin pending.  7. Diabetes mellitus with neuropathy: Management per primary. On lyrica.  8. COPD: Wheezing on exam but denies shortness of breath. Uses albuterol as an outpatient. Further management per primary.   [name], PA-C 12/02/2018, 8:48 AM  [facility] Pager: [phone]  I have seen and examined this patient and agree with plan and assessment in the above note with renal recommendations/intervention highlighted.  Will continue with HD on TTS schedule while he remains an inpatient.  Clayton John A Garvis Downum,MD 12/02/2018 12:06 PM

## 2018-12-02 NOTE — Progress Notes (Signed)
Occupational Therapy Evaluation Patient Details Name: [name] MRN: [id number] DOB: Jul 01, 1951 Today's Date: 12/02/2018    History of Present Illness 68 y.o. male who comes to the ER today with an infected foot. PMH: ESRD and DM.  Pt with ischemic right foot and xray with subcutaneous gas concerning for necrotizing infection. s/p R BKA 12/01/18   Clinical Impression   Pt s/p right BKA. PTA, pt lives at home with wife, independent with ADLs and uses a rollator for functional mobility. Now requires mod-max assist for LB ADLs in sitting. Completed sit<>stand with +2 mod assist from elevated bed surface. Evaluation limited due to transport arrival to take pt to HD.  Will continue to follow pt acutely in order to maximize safety and independence with ADLs.  Recommending CIR to further progress rehab prior to eventual return home.     Follow Up Recommendations  CIR    Equipment Recommendations  Other (comment)(defer to next venue)    Recommendations for Other Services Rehab consult     Precautions / Restrictions Precautions Precautions: Fall Restrictions Weight Bearing Restrictions: Yes RLE Weight Bearing: Non weight bearing      Mobility Bed Mobility Overal bed mobility: Needs Assistance Bed Mobility: Supine to Sit;Sit to Supine     Supine to sit: Min assist Sit to supine: Min assist   General bed mobility comments: minA for pt to pullup against therapist to bring hips to EoB, mina for management of LE into bed  Transfers Overall transfer level: Needs assistance Equipment used: Rolling walker (2 wheeled) Transfers: Sit to/from Stand Sit to Stand: Mod assist;From elevated surface;+2 physical assistance         General transfer comment: Pt unable to power up from low bed surface. With elevated bed surface, he was able to clear hips but not succesful with power up on first attempt. On second attempt from elevated surface, he was able to power up with cues to activate  glutes. Once in standing, he requires verbal and tactile cues for anterior pelvic tilt and upright posture.    Balance Overall balance assessment: Needs assistance Sitting-balance support: Feet supported;Single extremity supported Sitting balance-Leahy Scale: Poor     Standing balance support: Bilateral upper extremity supported Standing balance-Leahy Scale: Zero Standing balance comment: requires outside assist to maintain upright                           ADL either performed or assessed with clinical judgement   ADL Overall ADL's : Needs assistance/impaired Eating/Feeding: Independent   Grooming: Supervision/safety;Sitting   Upper Body Bathing: Supervision/ safety;Sitting   Lower Body Bathing: Moderate assistance;Sitting/lateral leans   Upper Body Dressing : Supervision/safety;Sitting   Lower Body Dressing: Maximal assistance;Sitting/lateral leans                       Vision         Perception     Praxis      Pertinent Vitals/Pain Pain Assessment: 0-10 Pain Score: 6  Pain Location: R residual limb Pain Descriptors / Indicators: Throbbing;Aching;Grimacing;Guarding Pain Intervention(s): Premedicated before session;Monitored during session;Repositioned     Hand Dominance Right   Extremity/Trunk Assessment Upper Extremity Assessment Upper Extremity Assessment: Overall WFL for tasks assessed   Lower Extremity Assessment Lower Extremity Assessment: Defer to PT evaluation       Communication Communication Communication: No difficulties   Cognition Arousal/Alertness: Awake/alert Behavior During Therapy: WFL for tasks assessed/performed Overall Cognitive  Status: Within Functional Limits for tasks assessed                                     General Comments       Exercises     Shoulder Instructions      Home Living Family/patient expects to be discharged to:: Private residence Living Arrangements:  Spouse/significant other Available Help at Discharge: Family;Available 24 hours/day Type of Home: House Home Access: Ramped entrance     Home Layout: One level     Bathroom Shower/Tub: Teacher, early years/pre: Standard Bathroom Accessibility: Yes   Home Equipment: Tub bench;Grab bars - toilet;Grab bars - tub/shower;Hand held shower head;Walker - 2 wheels;Wheelchair - Pharmacist, hospital)          Prior Functioning/Environment Level of Independence: Independent with assistive device(s)        Comments: using Rollator for ambulation         OT Problem List: Decreased strength;Decreased range of motion;Decreased activity tolerance;Impaired balance (sitting and/or standing);Decreased knowledge of use of DME or AE;Pain      OT Treatment/Interventions: Self-care/ADL training;Therapeutic exercise;DME and/or AE instruction;Therapeutic activities;Patient/family education;Balance training    OT Goals(Current goals can be found in the care plan section) Acute Rehab OT Goals Patient Stated Goal: go home OT Goal Formulation: With patient Time For Goal Achievement: 12/16/18 Potential to Achieve Goals: Good  OT Frequency: Min 2X/week   Barriers to D/C:            Co-evaluation PT/OT/SLP Co-Evaluation/Treatment: Yes Reason for Co-Treatment: For patient/therapist safety;To address functional/ADL transfers   OT goals addressed during session: ADL's and self-care      AM-PAC OT "6 Clicks" Daily Activity     Outcome Measure Help from another person eating meals?: None Help from another person taking care of personal grooming?: None Help from another person toileting, which includes using toliet, bedpan, or urinal?: A Lot Help from another person bathing (including washing, rinsing, drying)?: A Lot Help from another person to put on and taking off regular upper body clothing?: A Little Help from another person to put on and taking off regular lower body  clothing?: A Lot 6 Click Score: 17   End of Session Equipment Utilized During Treatment: Gait belt;Rolling walker Nurse Communication: Mobility status  Activity Tolerance: Patient tolerated treatment well Patient left: in bed;with call bell/phone within reach  OT Visit Diagnosis: Unsteadiness on feet (R26.81);Other abnormalities of gait and mobility (R26.89);Muscle weakness (generalized) (M62.81);Pain Pain - Right/Left: Right Pain - part of body: Leg                Time: 1130-1158 OT Time Calculation (min): 28 min Charges:  OT General Charges $OT Visit: 1 Visit OT Evaluation $OT Eval Moderate Complexity: 1 Mod    Darrol Jump OTR/L [place] [phone] 12/02/2018, 4:27 PM

## 2018-12-02 NOTE — Anesthesia Postprocedure Evaluation (Signed)
Anesthesia Post Note  Patient: [name]  Procedure(s) Performed: RIGHT AMPUTATION BELOW KNEE (Right Leg Lower)     Patient location during evaluation: PACU Anesthesia Type: General Level of consciousness: awake and alert Pain management: pain level controlled Vital Signs Assessment: post-procedure vital signs reviewed and stable Respiratory status: spontaneous breathing, nonlabored ventilation, respiratory function stable and patient connected to nasal cannula oxygen Cardiovascular status: blood pressure returned to baseline and stable Postop Assessment: no apparent nausea or vomiting Anesthetic complications: no    Last Vitals:  Vitals:   12/02/18 0315 12/02/18 0534  BP: 114/65 114/67  Pulse: 66 64  Resp: 13 12  Temp: (!) 36.4 C 36.5 C  SpO2: 100% 100%    Last Pain:  Vitals:   12/02/18 0534  TempSrc: Oral  PainSc:                  [facility]

## 2018-12-02 NOTE — Progress Notes (Signed)
Inpatient Rehabilitation Admissions Coordinator  I will meet with patient in the morning to discuss a possible inpt rehab admission. Pt in hemodialysis today during my rounds.  Danne Baxter, RN, MSN Rehab Admissions Coordinator [phone] 12/02/2018 8:08 PM

## 2018-12-02 NOTE — Progress Notes (Signed)
Inpatient Diabetes Program Recommendations  AACE/ADA: New Consensus Statement on Inpatient Glycemic Control (2015)  Target Ranges:  Prepandial:   less than 140 mg/dL      Peak postprandial:   less than 180 mg/dL (1-2 hours)      Critically ill patients:  140 - 180 mg/dL   Results for ROBBY, BULKLEY (MRN [id number]) as of 12/02/2018 11:56  Ref. Range 12/01/2018 13:56 12/01/2018 19:06 12/01/2018 21:26 12/02/2018 06:28 12/02/2018 11:05  Glucose-Capillary Latest Ref Range: 70 - 99 mg/dL 105 (H) 92 130 (H) 145 (H) 182 (H)   Review of Glycemic Control  Diabetes history: DM 2 Outpatient Diabetes medications: Toujeo 85 units qhs Current orders for Inpatient glycemic control: Lantus 85 units qhs, Novolog 0-15 units tid  Consult for postop management  Inpatient Diabetes Program Recommendations:    Lantus 85 units ordered for patient to get this evening. Patient did not received dose last night.  Called HD to speak with patient. RN on Dialysis unit asked patient for me as they do not have phones foe the patient's to use. Patient is comfortable receiving the full Lantus 85 units tonight. Will follow trends for now. If patient experiences hypoglycemia may need to titrate down.  Thanks, Tama Headings RN, MSN, BC-ADM Inpatient Diabetes Coordinator Team Pager [phone] (8a-5p)

## 2018-12-02 NOTE — Procedures (Signed)
I was present at this dialysis session. I have reviewed the session itself and made appropriate changes.   Vital signs in last 24 hours:  Temp:  [97.3 F (36.3 C)-98.3 F (36.8 C)] 97.5 F (36.4 C) (04/21 1215) Pulse Rate:  [61-103] 69 (04/21 1500) Resp:  [11-32] 13 (04/21 1108) BP: (81-157)/(55-94) 99/61 (04/21 1500) SpO2:  [91 %-100 %] 97 % (04/21 1215) Weight:  [117.6 kg-120 kg] 120 kg (04/21 1215) Weight change:  Filed Weights   12/01/18 1320 12/01/18 2354 12/02/18 1215  Weight: 117.9 kg 117.6 kg 120 kg    Recent Labs  Lab 12/02/18 0813  NA 136  K 4.7  CL 94*  CO2 21*  GLUCOSE 144*  BUN 58*  CREATININE 10.18*  CALCIUM 8.5*    Recent Labs  Lab 12/01/18 1414 12/01/18 2045 12/01/18 2151 12/02/18 0813  WBC 14.8*  --   --  11.2*  NEUTROABS 12.0*  --   --   --   HGB 8.4* 12.2* 8.1* 8.7*  HCT 27.6* 36.0* 25.8* 27.6*  MCV 98.6  --   --  95.2  PLT 333  --   --  311    Scheduled Meds: . atorvastatin  80 mg Oral QHS  . calcium acetate  2,668 mg Oral TID WC  . Chlorhexidine Gluconate Cloth  6 each Topical Q0600  . cinacalcet  30 mg Oral QPM  . clopidogrel  75 mg Oral Daily  . docusate sodium  100 mg Oral Daily  . doxercalciferol  0.5 mcg Intravenous Q T,Th,Sa-HD  . [START ON 12/03/2018] heparin  5,000 Units Subcutaneous Q8H  . insulin aspart  0-15 Units Subcutaneous TID WC  . insulin glargine  85 Units Subcutaneous QHS  . levothyroxine  125 mcg Oral Q0600  . Melatonin  9 mg Oral QHS  . pantoprazole  40 mg Oral Daily  . pregabalin  150 mg Oral TID  . risperiDONE  0.25 mg Oral Daily  . traZODone  50 mg Oral Daily   Continuous Infusions: . sodium chloride    . sodium chloride    . magnesium sulfate 1 - 4 g bolus IVPB     PRN Meds:.sodium chloride, sodium chloride, acetaminophen **OR** acetaminophen, albuterol, alteplase, bisacodyl, calcium acetate, guaiFENesin-dextromethorphan, hydrALAZINE, HYDROmorphone (DILAUDID) injection, labetalol, lidocaine (PF),  lidocaine-prilocaine, magnesium sulfate 1 - 4 g bolus IVPB, metoprolol tartrate, ondansetron, oxyCODONE-acetaminophen, pentafluoroprop-tetrafluoroeth, phenol, polyethylene glycol   Donetta Potts,  MD 12/02/2018, 3:11 PM

## 2018-12-02 NOTE — Progress Notes (Signed)
Carotid artery duplex completed.  12/02/2018 9:52 AM Maudry Mayhew, MHA, RVT, RDCS, RDMS

## 2018-12-02 NOTE — Progress Notes (Signed)
Physical Therapy Evaluation Patient Details Name: [name] MRN: [id number] DOB: May 21, 1951 Today's Date: 12/02/2018   History of Present Illness  68 y.o. male who comes to the ER today with an infected foot. PMH: ESRD and DM.  Pt with ischemic right foot and xray with subcutaneous gas concerning for necrotizing infection. 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 Pt was ambulation with Rollator and independent in iADLs. Pt is limited in safe mobility by decreased strength, balance and endurance in presence of R BKA. Pt currently requires minA for bed mobility and modAx2 for sit>stand transfers. Unable to progress to ambulation due to transport arrival to take pt to HD. Given pt has 24 hour care from wife and is motivated to get back to prior level of function pt will be a good candidate for CIR level rehab prior to going home. PT will continue to follow acutely.     Follow Up Recommendations CIR    Equipment Recommendations  Other (comment)(TBD)    Recommendations for Other Services       Precautions / Restrictions Precautions Precautions: Fall Restrictions Weight Bearing Restrictions: Yes RLE Weight Bearing: Non weight bearing      Mobility  Bed Mobility Overal bed mobility: Needs Assistance Bed Mobility: Supine to Sit;Sit to Supine     Supine to sit: Min assist Sit to supine: Min assist   General bed mobility comments: minA for pt to pullup against therapist to bring hips to EoB, mina for management of LE into bed  Transfers Overall transfer level: Needs assistance Equipment used: Rolling walker (2 wheeled) Transfers: Sit to/from Stand Sit to Stand: From elevated surface;Mod assist         General transfer comment: modA for powerup to standing, attempted from low bed surface and unable to clear hips from bed, with elevated surface unable on first attempt due to decreased anterior pelvic tilt and  glute activation, on final attempt able to come into standing for 2 min with vc for anterior pelvice tilt, chest forward and head up  Ambulation/Gait             General Gait Details: unable to attempt today        Balance Overall balance assessment: Needs assistance Sitting-balance support: Feet supported;Single extremity supported Sitting balance-Leahy Scale: Poor     Standing balance support: Bilateral upper extremity supported Standing balance-Leahy Scale: Zero Standing balance comment: requires outside assist to maintain upright                             Pertinent Vitals/Pain Pain Assessment: 0-10 Pain Score: 6  Pain Location: R residual limb Pain Descriptors / Indicators: Throbbing;Aching;Grimacing;Guarding Pain Intervention(s): Premedicated before session;Monitored during session;Limited activity within patient's tolerance;Repositioned    Home Living Family/patient expects to be discharged to:: Private residence Living Arrangements: Spouse/significant other Available Help at Discharge: Family;Available 24 hours/day Type of Home: House Home Access: Ramped entrance     Home Layout: One level Home Equipment: Tub bench;Grab bars - toilet;Grab bars - tub/shower;Hand held shower head;Walker - 2 wheels;Wheelchair - Pharmacist, hospital)      Prior Function Level of Independence: Independent with assistive device(s)         Comments: using Rollator for ambulation         Extremity/Trunk Assessment   Upper Extremity Assessment Upper Extremity Assessment: Defer to OT evaluation  Lower Extremity Assessment Lower Extremity Assessment: RLE deficits/detail;Generalized weakness;LLE deficits/detail RLE Deficits / Details: s/p BKA hip and knee ROM limited by pain  RLE: Unable to fully assess due to pain LLE Deficits / Details: nonhealing wound on plantar heel of L foot, hip, knee and ankle ROM WFL strength grossly 3+/5 due to  longstanding decreased weightshift to L to protect wound LLE Sensation: decreased light touch    Cervical / Trunk Assessment Cervical / Trunk Assessment: Normal  Communication   Communication: No difficulties  Cognition Arousal/Alertness: Awake/alert Behavior During Therapy: WFL for tasks assessed/performed Overall Cognitive Status: Within Functional Limits for tasks assessed                                        General Comments General comments (skin integrity, edema, etc.): Pt with pillow underneath R knee on entry, educated on need to maintain knee extension for future prosthetic placement.     Exercises General Exercises - Lower Extremity Quad Sets: AROM;Left;5 reps;Supine   Assessment/Plan    PT Assessment Patient needs continued PT services  PT Problem List Decreased strength;Decreased activity tolerance;Decreased balance;Decreased mobility;Pain;Impaired sensation       PT Treatment Interventions DME instruction;Gait training;Functional mobility training;Therapeutic activities;Therapeutic exercise;Balance training;Cognitive remediation;Patient/family education;Wheelchair mobility training    PT Goals (Current goals can be found in the Care Plan section)  Acute Rehab PT Goals Patient Stated Goal: go home PT Goal Formulation: With patient    Frequency Min 3X/week        Co-evaluation PT/OT/SLP Co-Evaluation/Treatment: Yes Reason for Co-Treatment: For patient/therapist safety PT goals addressed during session: Mobility/safety with mobility         AM-PAC PT "6 Clicks" Mobility  Outcome Measure Help needed turning from your back to your side while in a flat bed without using bedrails?: A Little Help needed moving from lying on your back to sitting on the side of a flat bed without using bedrails?: A Little Help needed moving to and from a bed to a chair (including a wheelchair)?: Total Help needed standing up from a chair using your arms (e.g.,  wheelchair or bedside chair)?: Total Help needed to walk in hospital room?: Total Help needed climbing 3-5 steps with a railing? : Total 6 Click Score: 10    End of Session Equipment Utilized During Treatment: Gait belt Activity Tolerance: Patient tolerated treatment well Patient left: in bed;Other (comment)(transport present to take to HD) Nurse Communication: Mobility status PT Visit Diagnosis: Unsteadiness on feet (R26.81);Other abnormalities of gait and mobility (R26.89);Difficulty in walking, not elsewhere classified (R26.2);Pain;Muscle weakness (generalized) (M62.81) Pain - Right/Left: Right Pain - part of body: (residual limb)    Time: 0623-7628 PT Time Calculation (min) (ACUTE ONLY): 28 min   Charges:   PT Evaluation $PT Eval Moderate Complexity: 1 Mod          Rae Sutcliffe B. Migdalia Dk PT, DPT Acute Rehabilitation Services Pager [phone] Office [phone]   [place] 12/02/2018, 1:06 PM

## 2018-12-02 NOTE — Progress Notes (Addendum)
[facility] [place]  Subjective  -  Pain in right residual limb.   Objective 117/65 61 (!) 97.5 F (36.4 C) (Oral) 11 99%  Intake/Output Summary (Last 24 hours) at 12/02/2018 5615 Last data filed at 12/02/2018 0534 Gross per 24 hour  Intake 1292.62 ml  Output 100 ml  Net 1192.62 ml    Right BKA dressing clean and dry Lungs wheezing, non labored Heart RRR  Assessment/Planning: POD # 1 Right BKA  He is wheezing and has history of tobacco abuse.  He uses a nebulizer at home.  I have consulted respiratory therapy for recommendations. Plan to change dressing on right BKA tomorrow.  Roxy Horseman 12/02/2018 8:21 AM --  Laboratory Lab Results: Recent Labs    12/01/18 1414 12/01/18 2045 12/01/18 2151  WBC 14.8*  --   --   HGB 8.4* 12.2* 8.1*  HCT 27.6* 36.0* 25.8*  PLT 333  --   --    BMET Recent Labs    12/01/18 1414 12/01/18 2045  NA 136 136  K 3.9 4.3  CL 96*  --   CO2 25  --   GLUCOSE 119* 142*  BUN 48*  --   CREATININE 9.30*  --   CALCIUM 9.2  --     COAG Lab Results  Component Value Date   INR 1.1 02/08/2017   INR 0.94 11/27/2010   INR 0.97 08/24/2010   No results found for: PTT   I agree with the above  Right BKA:  Plan dressing change tomorrow ID:  Continue IV abx Renal:  HD per nephrology Anemia: acute operative blood loss resolved, continue to monitor Hb DM:  Cont SSI  [place] Miyako Oelke

## 2018-12-03 ENCOUNTER — Other Ambulatory Visit: Payer: Self-pay

## 2018-12-03 ENCOUNTER — Inpatient Hospital Stay (HOSPITAL_COMMUNITY)
Admission: RE | Admit: 2018-12-03 | Discharge: 2018-12-19 | DRG: 559 | Disposition: A | Discharge status: Home Health | Payer: Medicare Other | Source: Intra-hospital | Attending: Physical Medicine & Rehabilitation | Admitting: Physical Medicine & Rehabilitation

## 2018-12-03 DIAGNOSIS — F419 Anxiety disorder, unspecified: Secondary | ICD-10-CM | POA: Diagnosis present

## 2018-12-03 DIAGNOSIS — E11649 Type 2 diabetes mellitus with hypoglycemia without coma: Secondary | ICD-10-CM | POA: Diagnosis not present

## 2018-12-03 DIAGNOSIS — Z87891 Personal history of nicotine dependence: Secondary | ICD-10-CM

## 2018-12-03 DIAGNOSIS — Z825 Family history of asthma and other chronic lower respiratory diseases: Secondary | ICD-10-CM

## 2018-12-03 DIAGNOSIS — E1122 Type 2 diabetes mellitus with diabetic chronic kidney disease: Secondary | ICD-10-CM | POA: Diagnosis not present

## 2018-12-03 DIAGNOSIS — N186 End stage renal disease: Secondary | ICD-10-CM | POA: Diagnosis present

## 2018-12-03 DIAGNOSIS — E1142 Type 2 diabetes mellitus with diabetic polyneuropathy: Secondary | ICD-10-CM | POA: Diagnosis not present

## 2018-12-03 DIAGNOSIS — E8889 Other specified metabolic disorders: Secondary | ICD-10-CM | POA: Diagnosis present

## 2018-12-03 DIAGNOSIS — F329 Major depressive disorder, single episode, unspecified: Secondary | ICD-10-CM | POA: Diagnosis present

## 2018-12-03 DIAGNOSIS — Z888 Allergy status to other drugs, medicaments and biological substances status: Secondary | ICD-10-CM

## 2018-12-03 DIAGNOSIS — J449 Chronic obstructive pulmonary disease, unspecified: Secondary | ICD-10-CM | POA: Diagnosis present

## 2018-12-03 DIAGNOSIS — R0989 Other specified symptoms and signs involving the circulatory and respiratory systems: Secondary | ICD-10-CM | POA: Diagnosis not present

## 2018-12-03 DIAGNOSIS — E039 Hypothyroidism, unspecified: Secondary | ICD-10-CM | POA: Diagnosis present

## 2018-12-03 DIAGNOSIS — M726 Necrotizing fasciitis: Secondary | ICD-10-CM

## 2018-12-03 DIAGNOSIS — L97429 Non-pressure chronic ulcer of left heel and midfoot with unspecified severity: Secondary | ICD-10-CM | POA: Diagnosis present

## 2018-12-03 DIAGNOSIS — I120 Hypertensive chronic kidney disease with stage 5 chronic kidney disease or end stage renal disease: Secondary | ICD-10-CM | POA: Diagnosis not present

## 2018-12-03 DIAGNOSIS — K5903 Drug induced constipation: Secondary | ICD-10-CM

## 2018-12-03 DIAGNOSIS — Z4781 Encounter for orthopedic aftercare following surgical amputation: Principal | ICD-10-CM

## 2018-12-03 DIAGNOSIS — E785 Hyperlipidemia, unspecified: Secondary | ICD-10-CM | POA: Diagnosis present

## 2018-12-03 DIAGNOSIS — G8918 Other acute postprocedural pain: Secondary | ICD-10-CM

## 2018-12-03 DIAGNOSIS — Z89511 Acquired absence of right leg below knee: Secondary | ICD-10-CM | POA: Diagnosis not present

## 2018-12-03 DIAGNOSIS — D638 Anemia in other chronic diseases classified elsewhere: Secondary | ICD-10-CM | POA: Diagnosis not present

## 2018-12-03 DIAGNOSIS — Z20828 Contact with and (suspected) exposure to other viral communicable diseases: Secondary | ICD-10-CM | POA: Diagnosis present

## 2018-12-03 DIAGNOSIS — Z79899 Other long term (current) drug therapy: Secondary | ICD-10-CM

## 2018-12-03 DIAGNOSIS — E1151 Type 2 diabetes mellitus with diabetic peripheral angiopathy without gangrene: Secondary | ICD-10-CM | POA: Diagnosis present

## 2018-12-03 DIAGNOSIS — S88111A Complete traumatic amputation at level between knee and ankle, right lower leg, initial encounter: Secondary | ICD-10-CM

## 2018-12-03 DIAGNOSIS — N2581 Secondary hyperparathyroidism of renal origin: Secondary | ICD-10-CM | POA: Diagnosis not present

## 2018-12-03 DIAGNOSIS — Z79890 Hormone replacement therapy (postmenopausal): Secondary | ICD-10-CM

## 2018-12-03 DIAGNOSIS — R7309 Other abnormal glucose: Secondary | ICD-10-CM

## 2018-12-03 DIAGNOSIS — Z8659 Personal history of other mental and behavioral disorders: Secondary | ICD-10-CM | POA: Diagnosis not present

## 2018-12-03 DIAGNOSIS — K219 Gastro-esophageal reflux disease without esophagitis: Secondary | ICD-10-CM | POA: Diagnosis present

## 2018-12-03 DIAGNOSIS — G546 Phantom limb syndrome with pain: Secondary | ICD-10-CM | POA: Diagnosis present

## 2018-12-03 DIAGNOSIS — G479 Sleep disorder, unspecified: Secondary | ICD-10-CM | POA: Diagnosis not present

## 2018-12-03 DIAGNOSIS — M792 Neuralgia and neuritis, unspecified: Secondary | ICD-10-CM | POA: Diagnosis not present

## 2018-12-03 DIAGNOSIS — E1140 Type 2 diabetes mellitus with diabetic neuropathy, unspecified: Secondary | ICD-10-CM | POA: Diagnosis not present

## 2018-12-03 DIAGNOSIS — E162 Hypoglycemia, unspecified: Secondary | ICD-10-CM | POA: Diagnosis not present

## 2018-12-03 DIAGNOSIS — I10 Essential (primary) hypertension: Secondary | ICD-10-CM | POA: Diagnosis not present

## 2018-12-03 DIAGNOSIS — Z885 Allergy status to narcotic agent status: Secondary | ICD-10-CM

## 2018-12-03 DIAGNOSIS — D509 Iron deficiency anemia, unspecified: Secondary | ICD-10-CM | POA: Diagnosis not present

## 2018-12-03 DIAGNOSIS — Z7902 Long term (current) use of antithrombotics/antiplatelets: Secondary | ICD-10-CM

## 2018-12-03 DIAGNOSIS — D62 Acute posthemorrhagic anemia: Secondary | ICD-10-CM

## 2018-12-03 DIAGNOSIS — I998 Other disorder of circulatory system: Secondary | ICD-10-CM

## 2018-12-03 DIAGNOSIS — E1165 Type 2 diabetes mellitus with hyperglycemia: Secondary | ICD-10-CM

## 2018-12-03 DIAGNOSIS — Z992 Dependence on renal dialysis: Secondary | ICD-10-CM | POA: Diagnosis not present

## 2018-12-03 DIAGNOSIS — D649 Anemia, unspecified: Secondary | ICD-10-CM

## 2018-12-03 DIAGNOSIS — E669 Obesity, unspecified: Secondary | ICD-10-CM | POA: Diagnosis present

## 2018-12-03 DIAGNOSIS — Z6832 Body mass index (BMI) 32.0-32.9, adult: Secondary | ICD-10-CM

## 2018-12-03 DIAGNOSIS — D631 Anemia in chronic kidney disease: Secondary | ICD-10-CM | POA: Diagnosis present

## 2018-12-03 DIAGNOSIS — Z794 Long term (current) use of insulin: Secondary | ICD-10-CM

## 2018-12-03 DIAGNOSIS — R251 Tremor, unspecified: Secondary | ICD-10-CM | POA: Diagnosis present

## 2018-12-03 DIAGNOSIS — Z8249 Family history of ischemic heart disease and other diseases of the circulatory system: Secondary | ICD-10-CM

## 2018-12-03 LAB — CBC
HCT: 24.4 % — ABNORMAL LOW (ref 39.0–52.0)
HCT: 24.8 % — ABNORMAL LOW (ref 39.0–52.0)
Hemoglobin: 8 g/dL — ABNORMAL LOW (ref 13.0–17.0)
Hemoglobin: 8 g/dL — ABNORMAL LOW (ref 13.0–17.0)
MCH: 30.5 pg (ref 26.0–34.0)
MCH: 30.9 pg (ref 26.0–34.0)
MCHC: 32.8 g/dL (ref 30.0–36.0)
MCHC: 32.3 g/dL (ref 30.0–36.0)
MCV: 93.1 fL (ref 80.0–100.0)
MCV: 95.8 fL (ref 80.0–100.0)
Platelets: 296 10*3/uL (ref 150–400)
Platelets: 315 10*3/uL (ref 150–400)
RBC: 2.62 MIL/uL — ABNORMAL LOW (ref 4.22–5.81)
RBC: 2.59 MIL/uL — ABNORMAL LOW (ref 4.22–5.81)
RDW: 17.1 % — ABNORMAL HIGH (ref 11.5–15.5)
RDW: 16.8 % — ABNORMAL HIGH (ref 11.5–15.5)
WBC: 7.2 10*3/uL (ref 4.0–10.5)
WBC: 7 10*3/uL (ref 4.0–10.5)
nRBC: 0 % (ref 0.0–0.2)
nRBC: 0 % (ref 0.0–0.2)

## 2018-12-03 LAB — TYPE AND SCREEN
ABO/RH(D): A POS
Antibody Screen: NEGATIVE
Unit division: 0
Unit division: 0

## 2018-12-03 LAB — RENAL FUNCTION PANEL
Albumin: 1.9 g/dL — ABNORMAL LOW (ref 3.5–5.0)
Anion gap: 12 (ref 5–15)
BUN: 37 mg/dL — ABNORMAL HIGH (ref 8–23)
CO2: 26 mmol/L (ref 22–32)
Calcium: 8.7 mg/dL — ABNORMAL LOW (ref 8.9–10.3)
Chloride: 98 mmol/L (ref 98–111)
Creatinine, Ser: 7.77 mg/dL — ABNORMAL HIGH (ref 0.61–1.24)
GFR calc Af Amer: 7 mL/min — ABNORMAL LOW (ref >60)
GFR calc non Af Amer: 6 mL/min — ABNORMAL LOW (ref >60)
Glucose, Bld: 169 mg/dL — ABNORMAL HIGH (ref 70–99)
Phosphorus: 4.7 mg/dL — ABNORMAL HIGH (ref 2.5–4.6)
Potassium: 3.9 mmol/L (ref 3.5–5.1)
Sodium: 136 mmol/L (ref 135–145)

## 2018-12-03 LAB — CREATININE, SERUM
Creatinine, Ser: 7.87 mg/dL — ABNORMAL HIGH (ref 0.61–1.24)
GFR calc Af Amer: 7 mL/min — ABNORMAL LOW (ref >60)
GFR calc non Af Amer: 6 mL/min — ABNORMAL LOW (ref >60)

## 2018-12-03 LAB — BASIC METABOLIC PANEL
Anion gap: 15 (ref 5–15)
BUN: 28 mg/dL — ABNORMAL HIGH (ref 8–23)
CO2: 25 mmol/L (ref 22–32)
Calcium: 8.4 mg/dL — ABNORMAL LOW (ref 8.9–10.3)
Chloride: 95 mmol/L — ABNORMAL LOW (ref 98–111)
Creatinine, Ser: 6.26 mg/dL — ABNORMAL HIGH (ref 0.61–1.24)
GFR calc Af Amer: 10 mL/min — ABNORMAL LOW (ref >60)
GFR calc non Af Amer: 8 mL/min — ABNORMAL LOW (ref >60)
Glucose, Bld: 216 mg/dL — ABNORMAL HIGH (ref 70–99)
Potassium: 3.8 mmol/L (ref 3.5–5.1)
Sodium: 135 mmol/L (ref 135–145)

## 2018-12-03 LAB — BPAM RBC
Blood Product Expiration Date: 202005022359
Blood Product Expiration Date: 202005022359
ISSUE DATE / TIME: 202004202250
ISSUE DATE / TIME: 202004210253
Unit Type and Rh: 6200
Unit Type and Rh: 6200

## 2018-12-03 LAB — GLUCOSE, CAPILLARY
Glucose-Capillary: 133 mg/dL — ABNORMAL HIGH (ref 70–99)
Glucose-Capillary: 142 mg/dL — ABNORMAL HIGH (ref 70–99)
Glucose-Capillary: 216 mg/dL — ABNORMAL HIGH (ref 70–99)
Glucose-Capillary: 151 mg/dL — ABNORMAL HIGH (ref 70–99)

## 2018-12-03 MED ORDER — CINACALCET HCL 30 MG PO TABS
30.0000 mg | ORAL_TABLET | Freq: Every evening | ORAL | Status: DC
  Administered 2018-12-04 – 2018-12-18 (×13): 30 mg via ORAL
  Filled 2018-12-03 (×12): qty 1

## 2018-12-03 MED ORDER — ATORVASTATIN CALCIUM 80 MG PO TABS
80.0000 mg | ORAL_TABLET | Freq: Every day | ORAL | Status: DC
  Administered 2018-12-03 – 2018-12-18 (×16): 80 mg via ORAL
  Filled 2018-12-03 (×16): qty 1

## 2018-12-03 MED ORDER — ACETAMINOPHEN 325 MG PO TABS
325.0000 mg | ORAL_TABLET | ORAL | Status: DC | PRN
  Filled 2018-12-03: qty 2

## 2018-12-03 MED ORDER — PANTOPRAZOLE SODIUM 40 MG PO TBEC
40.0000 mg | DELAYED_RELEASE_TABLET | Freq: Every day | ORAL | Status: DC
  Administered 2018-12-04 – 2018-12-19 (×16): 40 mg via ORAL
  Filled 2018-12-03 (×16): qty 1

## 2018-12-03 MED ORDER — CALCIUM ACETATE (PHOS BINDER) 667 MG PO CAPS
1334.0000 mg | ORAL_CAPSULE | ORAL | Status: DC | PRN
  Filled 2018-12-03: qty 2

## 2018-12-03 MED ORDER — RISPERIDONE 0.25 MG PO TABS
0.2500 mg | ORAL_TABLET | Freq: Every day | ORAL | Status: DC
  Administered 2018-12-04 – 2018-12-19 (×16): 0.25 mg via ORAL
  Filled 2018-12-03 (×16): qty 1

## 2018-12-03 MED ORDER — TRAZODONE HCL 50 MG PO TABS
50.0000 mg | ORAL_TABLET | Freq: Every day | ORAL | Status: DC
  Filled 2018-12-03: qty 1

## 2018-12-03 MED ORDER — HEPARIN SODIUM (PORCINE) 1000 UNIT/ML DIALYSIS
20.0000 [IU]/kg | INTRAMUSCULAR | Status: DC | PRN
  Filled 2018-12-03: qty 3

## 2018-12-03 MED ORDER — MELATONIN 3 MG PO TABS
9.0000 mg | ORAL_TABLET | Freq: Every day | ORAL | Status: DC
  Administered 2018-12-03 – 2018-12-18 (×16): 9 mg via ORAL
  Filled 2018-12-03 (×16): qty 3

## 2018-12-03 MED ORDER — HEPARIN SODIUM (PORCINE) 5000 UNIT/ML IJ SOLN: 5000.0000 [IU] | Freq: Three times a day (TID) | INTRAMUSCULAR | Status: DC

## 2018-12-03 MED ORDER — DOCUSATE SODIUM 100 MG PO CAPS
100.0000 mg | ORAL_CAPSULE | Freq: Every day | ORAL | Status: DC
  Administered 2018-12-04 – 2018-12-09 (×6): 100 mg via ORAL
  Filled 2018-12-03 (×6): qty 1

## 2018-12-03 MED ORDER — BISACODYL 10 MG RE SUPP
10.0000 mg | Freq: Every day | RECTAL | Status: DC | PRN
  Administered 2018-12-05 – 2018-12-18 (×3): 10 mg via RECTAL
  Filled 2018-12-03 (×5): qty 1

## 2018-12-03 MED ORDER — LEVOTHYROXINE SODIUM 25 MCG PO TABS
125.0000 ug | ORAL_TABLET | Freq: Every day | ORAL | Status: DC
  Administered 2018-12-04 – 2018-12-19 (×16): 125 ug via ORAL
  Filled 2018-12-03 (×16): qty 1

## 2018-12-03 MED ORDER — DOXERCALCIFEROL 4 MCG/2ML IV SOLN
0.5000 ug | INTRAVENOUS | Status: DC
  Filled 2018-12-03: qty 2

## 2018-12-03 MED ORDER — CLOPIDOGREL BISULFATE 75 MG PO TABS
75.0000 mg | ORAL_TABLET | Freq: Every day | ORAL | Status: DC
  Administered 2018-12-04 – 2018-12-19 (×16): 75 mg via ORAL
  Filled 2018-12-03 (×16): qty 1

## 2018-12-03 MED ORDER — POLYETHYLENE GLYCOL 3350 17 G PO PACK
17.0000 g | PACK | Freq: Every day | ORAL | Status: DC | PRN
  Administered 2018-12-04: 22:00:00 17 g via ORAL
  Filled 2018-12-03: qty 1

## 2018-12-03 MED ORDER — INSULIN ASPART 100 UNIT/ML ~~LOC~~ SOLN
0.0000 [IU] | Freq: Three times a day (TID) | SUBCUTANEOUS | Status: DC
  Administered 2018-12-07: 3 [IU] via SUBCUTANEOUS
  Administered 2018-12-07 – 2018-12-08 (×2): 2 [IU] via SUBCUTANEOUS
  Administered 2018-12-13: 3 [IU] via SUBCUTANEOUS
  Administered 2018-12-13: 2 [IU] via SUBCUTANEOUS
  Administered 2018-12-14 – 2018-12-15 (×3): 3 [IU] via SUBCUTANEOUS
  Administered 2018-12-15 – 2018-12-17 (×4): 2 [IU] via SUBCUTANEOUS
  Administered 2018-12-17: 8 [IU] via SUBCUTANEOUS
  Administered 2018-12-17: 2 [IU] via SUBCUTANEOUS
  Administered 2018-12-18: 3 [IU] via SUBCUTANEOUS
  Administered 2018-12-18 – 2018-12-19 (×2): 2 [IU] via SUBCUTANEOUS

## 2018-12-03 MED ORDER — INSULIN GLARGINE 100 UNIT/ML ~~LOC~~ SOLN
85.0000 [IU] | Freq: Every day | SUBCUTANEOUS | Status: DC
  Administered 2018-12-03 – 2018-12-05 (×3): 85 [IU] via SUBCUTANEOUS
  Filled 2018-12-03 (×4): qty 0.85

## 2018-12-03 MED ORDER — OXYCODONE-ACETAMINOPHEN 5-325 MG PO TABS
1.0000 | ORAL_TABLET | ORAL | Status: DC | PRN
  Administered 2018-12-03 – 2018-12-09 (×20): 2 via ORAL
  Administered 2018-12-09: 1 via ORAL
  Administered 2018-12-10 – 2018-12-18 (×27): 2 via ORAL
  Filled 2018-12-03 (×19): qty 2
  Filled 2018-12-03: qty 1
  Filled 2018-12-03 (×31): qty 2

## 2018-12-03 MED ORDER — ALBUTEROL SULFATE (2.5 MG/3ML) 0.083% IN NEBU: 3.0000 mL | INHALATION_SOLUTION | Freq: Four times a day (QID) | RESPIRATORY_TRACT | Status: DC | PRN

## 2018-12-03 MED ORDER — CALCIUM ACETATE (PHOS BINDER) 667 MG PO CAPS
2668.0000 mg | ORAL_CAPSULE | Freq: Three times a day (TID) | ORAL | Status: DC
  Administered 2018-12-04 – 2018-12-19 (×33): 2668 mg via ORAL
  Filled 2018-12-03 (×47): qty 4

## 2018-12-03 MED ORDER — ACETAMINOPHEN 650 MG RE SUPP: 325.0000 mg | RECTAL | Status: DC | PRN

## 2018-12-03 MED ORDER — PREGABALIN 75 MG PO CAPS
150.0000 mg | ORAL_CAPSULE | Freq: Three times a day (TID) | ORAL | Status: DC
  Administered 2018-12-03 – 2018-12-19 (×42): 150 mg via ORAL
  Filled 2018-12-03 (×43): qty 2

## 2018-12-03 MED ORDER — HEPARIN SODIUM (PORCINE) 5000 UNIT/ML IJ SOLN
5000.0000 [IU] | Freq: Three times a day (TID) | INTRAMUSCULAR | Status: DC
  Administered 2018-12-03 – 2018-12-19 (×42): 5000 [IU] via SUBCUTANEOUS
  Filled 2018-12-03 (×42): qty 1

## 2018-12-03 NOTE — Progress Notes (Signed)
Patient transferred to 4MW R11 along with his personal belongings.

## 2018-12-03 NOTE — Progress Notes (Signed)
Physical Therapy Treatment Patient Details Name: [name] MRN: [id number] DOB: October 16, 1950 Today's Date: 12/03/2018    History of Present Illness 68 y.o. male who comes to the ER today with an infected foot. PMH: ESRD and DM.  Pt with ischemic right foot and xray with subcutaneous gas concerning for necrotizing infection. 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  Emphasis on transitions to EOB and 6 trials of standing, working on safest technique.   BKA exercise.    Follow Up Recommendations  CIR     Equipment Recommendations       Recommendations for Other Services       Precautions / Restrictions Precautions Precautions: Fall Restrictions RLE Weight Bearing: Non weight bearing    Mobility  Bed Mobility Overal bed mobility: Needs Assistance Bed Mobility: Supine to Sit;Sit to Supine     Supine to sit: Min assist Sit to supine: Min guard   General bed mobility comments: Support and assist forward and pt came up on L elbow  Transfers Overall transfer level: Needs assistance Equipment used: Rolling walker (2 wheeled) Transfers: Sit to/from Stand Sit to Stand: Min assist;Mod assist;+2 safety/equipment;+2 physical assistance(x6 trials)         General transfer comment: Assistance needs improved with practiced and improved technique over 6 trials.  pt unable to pick up his weight, but was able to pivot his foot.  Ambulation/Gait                 Stairs             Wheelchair Mobility    Modified Rankin (Stroke Patients Only)       Balance Overall balance assessment: Needs assistance Sitting-balance support: Feet supported;Single extremity supported Sitting balance-Leahy Scale: Fair     Standing balance support: Bilateral upper extremity supported Standing balance-Leahy Scale: Poor Standing balance comment: cues and support for sub maximal upright stance.                             Cognition Arousal/Alertness: Awake/alert Behavior During Therapy: WFL for tasks assessed/performed Overall Cognitive Status: Within Functional Limits for tasks assessed                                        Exercises Amputee Exercises Quad Sets: AROM;Right;10 reps;Supine Gluteal Sets: AROM;Right;10 reps;Supine Hip Extension: AROM;Strengthening;Right;10 reps;Supine Knee Flexion: AROM;Right;10 reps;Supine Other Exercises Other Exercises: 10 reps of resisted L LE hip/knee flexion/ext with resistance in flexion/ext    General Comments General comments (skin integrity, edema, etc.): discused parallel leg positioning, no pillow under R knee.      Pertinent Vitals/Pain Pain Assessment: Faces Faces Pain Scale: Hurts even more Pain Location: R residual limb Pain Descriptors / Indicators: Throbbing;Aching;Grimacing;Guarding Pain Intervention(s): Monitored during session    Home Living                      Prior Function            PT Goals (current goals can now be found in the care plan section) Acute Rehab PT Goals Patient Stated Goal: go home PT Goal Formulation: With patient Progress towards PT goals: Progressing toward goals    Frequency    Min 3X/week      PT  Plan Current plan remains appropriate    Co-evaluation              AM-PAC PT "6 Clicks" Mobility   Outcome Measure  Help needed turning from your back to your side while in a flat bed without using bedrails?: A Little Help needed moving from lying on your back to sitting on the side of a flat bed without using bedrails?: A Little Help needed moving to and from a bed to a chair (including a wheelchair)?: A Lot Help needed standing up from a chair using your arms (e.g., wheelchair or bedside chair)?: A Lot Help needed to walk in hospital room?: Total Help needed climbing 3-5 steps with a railing? : Total 6 Click Score: 12    End of Session   Activity Tolerance:  Patient tolerated treatment well Patient left: in bed;Other (comment) Nurse Communication: Mobility status PT Visit Diagnosis: Unsteadiness on feet (R26.81);Other abnormalities of gait and mobility (R26.89) Pain - Right/Left: Right Pain - part of body: Leg     Time: 9381-8299 PT Time Calculation (min) (ACUTE ONLY): 32 min  Charges:  $Therapeutic Exercise: 8-22 mins $Therapeutic Activity: 8-22 mins                     12/03/2018  Donnella Sham, PT [place] [phone]  (pager) [phone]  (office)   Tessie Fass Maela [employer] 12/03/2018, 6:46 PM

## 2018-12-03 NOTE — Progress Notes (Signed)
Patient ID: [name], male   DOB: Sep 20, 1950, 68 y.o.   MRN: [id number]  [facility] Progress Note    Subjective:   Feels well, no complaints.  Going to be admitted to inpatient rehab today.   Objective:   BP (!) 152/93 (BP Location: Right Arm)   Pulse 73   Temp 98.2 F (36.8 C) (Oral)   Resp 16   Ht 6\' 4"  (1.93 m)   Wt 118.7 kg   SpO2 99%   BMI 31.85 kg/m   Intake/Output: I/O last 3 completed shifts: In: 1292.6 [I.V.:300; Blood:662; Other:25; IV Piggyback:305.6] Out: 2100 [Other:2000; Blood:100]   Intake/Output this shift:  Total I/O In: 120 [P.O.:120] Out: -  Weight change: 2.065 kg  Physical Exam: Gen: NAD CVS: no rub Resp: cta Abd: benign Ext: s/p RBKA, no edema, LUE AVF +T/B  Labs: BMET Recent Labs  Lab 12/01/18 1414 12/01/18 2045 12/02/18 0813 12/03/18 0325  NA 136 136 136 135  K 3.9 4.3 4.7 3.8  CL 96*  --  94* 95*  CO2 25  --  21* 25  GLUCOSE 119* 142* 144* 216*  BUN 48*  --  58* 28*  CREATININE 9.30*  --  10.18* 6.26*  CALCIUM 9.2  --  8.5* 8.4*   CBC Recent Labs  Lab 12/01/18 1414 12/01/18 2045 12/01/18 2151 12/02/18 0813 12/03/18 0325  WBC 14.8*  --   --  11.2* 7.2  NEUTROABS 12.0*  --   --   --   --   HGB 8.4* 12.2* 8.1* 8.7* 8.0*  HCT 27.6* 36.0* 25.8* 27.6* 24.4*  MCV 98.6  --   --  95.2 93.1  PLT 333  --   --  311 296    @IMGRELPRIORS @ Medications:    . atorvastatin  80 mg Oral QHS  . calcium acetate  2,668 mg Oral TID WC  . Chlorhexidine Gluconate Cloth  6 each Topical Q0600  . cinacalcet  30 mg Oral QPM  . clopidogrel  75 mg Oral Daily  . docusate sodium  100 mg Oral Daily  . doxercalciferol  0.5 mcg Intravenous Q T,Th,Sa-HD  . heparin  5,000 Units Subcutaneous Q8H  . insulin aspart  0-15 Units Subcutaneous TID WC  . insulin glargine  85 Units Subcutaneous QHS  . levothyroxine  125 mcg Oral Q0600  . Melatonin  9 mg Oral QHS  . pantoprazole  40 mg Oral Daily  . pregabalin  150 mg Oral TID  .  risperiDONE  0.25 mg Oral Daily  . traZODone  50 mg Oral Daily   Dialysis Orders: Center: [place]  on TTS . EDW 119.5 kg; Time: 4.5 hr; 2K/2.5Ca BFR 400, DFR 600     Temp 35C Primary nephrologist: Dr. Lowanda Foster  Heparin 2000 unit bolus and 1000 units/hr Epogen 1000 units 3x week Hectorol 0.84mcg IV 3x week Venofer 100 mg IV 3x week  Recent labs 11/25/2018: Hgb 10.0, Ca 9.1, Phos 4.9, Corr Ca 9.2, K 5.1  Assessment/ Plan:   1.  Ischemic R foot: S/p emergent amputation 12/01/18. Patient started on vanc and zosyn. Pain currently controlled. Further management per vascular surgery/primary. 2.  ESRD:  Normally TTS schedule, reports compliance with HD.  1. Tolerated HD well yesterday 2. Continue with TTS schedule 3. Will start tight heparin tomorrow since he will be 3 days out from surgery.  3.  Hypertension/volume: Blood pressure well controlled. No edema on exam 1. Will need new, lower dry weight  at time of discharge following amputation. 4.  Anemia: Hgb 8.7 post op. Receiving epogen and venofer as outpatient. Will order ESA, holding venofer in the setting of acute infection. 5.  Metabolic bone disease:  Calcium 9.2, albumin pending. Reports better control of phosphorus lately. Continue phoslo binder, sensipar, hectorol.  6.  Nutrition:  Albumin pending.  7. Diabetes mellitus with neuropathy: Management per primary. On lyrica.  8. COPD: Uses albuterol as an outpatient. Further management per primary.  9. Disposition- for discharge to [facility] today.  Donetta Potts, MD [place] Pager [phone] 12/03/2018, 12:03 PM

## 2018-12-03 NOTE — Progress Notes (Signed)
Patient arrived from [facility] via bed, placed in 4M11.

## 2018-12-03 NOTE — Progress Notes (Addendum)
[facility] [place]  Subjective  - Doing OK over all.  He has moderate pain in his stump.   Objective (!) 157/74 73 98.2 F (36.8 C) (Oral) 14 97%  Intake/Output Summary (Last 24 hours) at 12/03/2018 0829 Last data filed at 12/02/2018 1650 Gross per 24 hour  Intake -  Output 2000 ml  Net -2000 ml    Right BKA incision healing well without active drainage.  Clean dry dressing reapplied. Heart RRR Lungs non labored breathing  Assessment/Planning: POD # 2 right BKA  Right BKA well perfused and appears viable.  Summary: Right Carotid: Velocities in the right ICA are consistent with a 1-39% stenosis.  Left Carotid: Velocities in the left ICA are consistent with a 40-59% stenosis.  Vertebrals:  Bilateral vertebral arteries demonstrate antegrade flow. Subclavians: Left subclavian artery was monophasic, suggestive of possible              proximal obstruction. Normal flow hemodynamics were seen in the              right subclavian artery.  We will plan for repeat carotid duplex in 6- 1 year.  He is symptomatic.   [employer] will be consulted for stump sock. Pending CIR evaluaation.   Roxy Horseman 12/03/2018 8:29 AM --  Laboratory Lab Results: Recent Labs    12/02/18 0813 12/03/18 0325  WBC 11.2* 7.2  HGB 8.7* 8.0*  HCT 27.6* 24.4*  PLT 311 296   BMET Recent Labs    12/02/18 0813 12/03/18 0325  NA 136 135  K 4.7 3.8  CL 94* 95*  CO2 21* 25  GLUCOSE 144* 216*  BUN 58* 28*  CREATININE 10.18* 6.26*  CALCIUM 8.5* 8.4*    COAG Lab Results  Component Value Date   INR 1.1 02/08/2017   INR 0.94 11/27/2010   INR 0.97 08/24/2010   No results found for: PTT   I have independently interviewed and examined patient and agree with PA assessment and plan above. To CIR today.  Somaya Grassi C. Donzetta Matters, MD [facility] [place] Office: [phone] Pager: [phone]

## 2018-12-03 NOTE — H&P (Signed)
Physical Medicine and Rehabilitation Admission H&P    Chief Complaint  Patient presents with   Wound Check  chief complaint: Stomach pain  HPI: [name] is a 68 year old right-handed male history of COPD who quit smoking 6 years ago, end-stage renal disease with hemodialysis Tuesday Thursday and Saturday at [facility] in [facility], diabetes mellitus peripheral neuropathy, hypertension and peripheral vascular disease maintained on Plavix.  Per chart review and patient, patient lives with his wife. Independent ADLs and uses a Rollator for functional mobility. one level home with ramped entrance. Presented 12/01/2018 with ischemic right foot. Per report patient turned his right great toe approximately 1 week prior and developed a black line on the bottom of the toe.  X-rays were performed which showed gaseous changes, concerning for necrotizing infection.  Limb was not felt to be salvageable. 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 [name].Hospital course complicated by pain.  Hemodialysis ongoing as per renal services. A carotid Dopplers done for evaluation of bruit showed left ICA stenosis 40-59% and monitored.  Hospital course further complicated by acute on chronic anemia.  Patient notes that he had a fall prior to this and has had gluteal pain since that time.  He is unsure how he is going to be able to tolerate rehab.  Subcutaneous heparin for DVT prophylaxis. Therapy evaluation completed with recommendations of physical medicine rehabilitation consult. Patient was admitted for a comprehensive rehabilitation program.  Please see preadmission assessment from earlier today as well.  Review of Systems  HENT: Negative for hearing loss.   Eyes: Negative for blurred vision and double vision.  Respiratory: Positive for shortness of breath. Negative for cough.   Cardiovascular: Positive for leg swelling. Negative for chest pain and palpitations.  Gastrointestinal: Positive for  constipation. Negative for heartburn, nausea and vomiting.       GERD  Genitourinary: Negative for dysuria, flank pain and hematuria.  Musculoskeletal: Positive for back pain, joint pain and myalgias.  Skin: Negative for rash.  Neurological: Positive for tingling and headaches.       Phantom limb pain  Psychiatric/Behavioral: Positive for depression. The patient has insomnia.   All other systems reviewed and are negative.  Past Medical History:  Diagnosis Date   Anemia    Anxiety    Arthritis    Chronic kidney disease    COPD (chronic obstructive pulmonary disease) (HCC)    Pt denies   Depression    Diabetes mellitus without complication (HCC)    GERD (gastroesophageal reflux disease)    H/O hiatal hernia    Headache(784.0)    Hx: Migraines   Hypertension    Neuropathy    Numbness of toes    toes and feet   Pneumonia    Hx: of several times   Renal insufficiency    Shortness of breath dyspnea    Type 2 diabetes mellitus ([place])    Past Surgical History:  Procedure Laterality Date   AMPUTATION Right 12/01/2018   Procedure: RIGHT AMPUTATION BELOW KNEE;  Surgeon: [name], MD;  Location: [facility] OR;  Service: Vascular;  Laterality: Right;   AV FISTULA PLACEMENT  2012      left arm    AV FISTULA PLACEMENT Left 12/18/2012   Procedure: ARTERIOVENOUS (AV) FISTULA CREATION;  Surgeon: Angelia Mould, MD;  Location[facility];  Service: Vascular;  Laterality: Left;   COLONOSCOPY  10/26/2011   Procedure: COLONOSCOPY;  Surgeon: Rogene Houston, MD;  [facility];  Service: Endoscopy;  Laterality: N/A;  730   EMBOLECTOMY Left 12/09/2012   Procedure: EMBOLECTOMY;  Surgeon: Serafina [place], MD;  Location: [facility] CATH LAB;  Service: Cardiovascular;  Laterality: Left;  left arm venous embolization   ESOPHAGOGASTRODUODENOSCOPY (EGD) WITH ESOPHAGEAL DILATION N/A 04/23/2013   Procedure: ESOPHAGOGASTRODUODENOSCOPY (EGD) WITH ESOPHAGEAL DILATION;  Surgeon: Rogene Houston, MD;  [facility];  Service: Endoscopy;  Laterality: N/A;  200-moved to 930    FISTULA SUPERFICIALIZATION Left 06/18/2013   Procedure: FISTULA SUPERFICIALIZATION & LIGATION BRANCH X 1;  Surgeon: Mal Misty, MD;  Location: [employer];  Service: Vascular;  Laterality: Left;   HEMATOMA EVACUATION Right 02/11/2017   Procedure: EVACUATION HEMATOMA RIGHT GROIN, Repair of Right Pseudo-anerysm.;  Surgeon: [name], MD;  Location: [place];  Service: Vascular;  Laterality: Right;   INGUINAL HERNIA REPAIR     ,  times   2   INSERTION OF DIALYSIS CATHETER Left 12/18/2012   Procedure: INSERTION OF DIALYSIS CATHETER;  Surgeon: Angelia Mould, MD;  Location: [place];  Service: Vascular;  Laterality: Left;   KNEE ARTHROSCOPY  2011   Right Knee   LOWER EXTREMITY ANGIOGRAPHY N/A 02/11/2017   Procedure: Lower Extremity Angiography;  Surgeon: Lorretta Harp, MD;  Location: [place] CV LAB;  Service: Cardiovascular;  Laterality: N/A;   PERIPHERAL ATHRECTOMY  02/11/2017   PERIPHERAL VASCULAR ATHERECTOMY Left 02/11/2017   Procedure: Peripheral Vascular Atherectomy;  Surgeon: Lorretta Harp, MD;  Location: [place] CV LAB;  Service: Cardiovascular;  Laterality: Left;   REVISON OF ARTERIOVENOUS FISTULA Left 9/98/3382   Procedure: PLICATION OF LEFT BRACHIOCEPHALIC ARTERIOVENOUS FISTULA;  Surgeon: Conrad Isabel, MD;  Location: [place];  Service: Vascular;  Laterality: Left;   SHUNTOGRAM N/A 12/09/2012   Procedure: fistulogram;  Surgeon: Serafina [place], MD;  Location: [facility] CATH LAB;  Service: Cardiovascular;  Laterality: N/A;   SHUNTOGRAM Left 06/03/2013   Procedure: Fistulogram;  Surgeon: Serafina [place], MD;  Location: [facility] C[facility] LAB;  Service: Cardiovascular;  Laterality: Left;   THROMBECTOMY W/ EMBOLECTOMY Left 12/11/2012   Procedure: THROMBECTOMY ARTERIOVENOUS FISTULA;  Surgeon: Serafina Mitchell, MD;  Location: [facility] OR;  Service: Vascular;  Laterality: Left;   TONSILLECTOMY      Family History  Problem Relation Age of Onset   Heart disease Father        Heart Disease before age 68   Social History:  reports that he quit smoking about 6 years ago. His smoking use included cigars. He quit after 10.00 years of use. He has never used smokeless tobacco. He reports that he does not drink alcohol or use drugs. Allergies:  Allergies  Allergen Reactions   Morphine And Related Other (See Comments)    Not effective   Zaroxolyn [Metolazone] Other (See Comments)    Reaction not recalled by the patient   Medications Prior to Admission  Medication Sig Dispense Refill   albuterol (VENTOLIN HFA) 108 (90 Base) MCG/ACT inhaler Inhale 2 puffs into the lungs every 6 (six) hours as needed for wheezing or shortness of breath.     ARTIFICIAL TEAR OP Place 1 drop into both eyes every 6 (six) hours as needed (dry eyes).     atorvastatin (LIPITOR) 80 MG tablet Take 80 mg by mouth at bedtime.  5   calcium acetate (PHOSLO) 667 MG capsule Take 2 capsules (1,334 mg total) by mouth 3 (three) times daily with meals. (Patient taking differently: Take 1,334-2,668 mg  by mouth See admin instructions. Take 2,668 mg by mouth three times a day with meals and 1,334 mg with each snack)  10   cephALEXin (KEFLEX) 500 MG capsule Take 500 mg by mouth 4 (four) times daily. FOR 10 DAYS     cinacalcet (SENSIPAR) 30 MG tablet Take 30 mg by mouth every evening.     clopidogrel (PLAVIX) 75 MG tablet Take 75 mg by mouth daily.     levothyroxine (SYNTHROID, LEVOTHROID) 125 MCG tablet Take 125 mcg by mouth daily before breakfast.     Melatonin 10 MG TABS Take 10 mg by mouth at bedtime.     polyethylene glycol (MIRALAX / GLYCOLAX) packet Take 17 g by mouth daily as needed for moderate constipation.      pregabalin (LYRICA) 150 MG capsule Take 150 mg by mouth 3 (three) times daily.     SALINE NASAL SPRAY NA Place 2 sprays into both nostrils as needed (seasonal allergies).     silver sulfADIAZINE  (SILVADENE) 1 % cream Apply 1 application topically 2 (two) times daily. TO BOTH FEET     TOUJEO SOLOSTAR 300 UNIT/ML SOPN Inject 85 Units as directed at bedtime.   10   nortriptyline (PAMELOR) 25 MG capsule Take 50 mg by mouth at bedtime.   10   risperiDONE (RISPERDAL) 0.25 MG tablet Take 0.25 mg by mouth daily.     traZODone (DESYREL) 50 MG tablet Take 50 mg by mouth daily.   10    Drug Regimen Review Drug regimen was reviewed and remains appropriate with no significant issues identified  Home: Home Living Family/patient expects to be discharged to:: Private residence Living Arrangements: Spouse/significant other Available Help at Discharge: Family, Available 24 hours/day Type of Home: House Home Access: Ramped entrance Home Layout: One level Bathroom Shower/Tub: Chiropodist: Standard Bathroom Accessibility: Yes Home Equipment: Tub bench, Grab bars - toilet, Grab bars - tub/shower, Hand held shower head, Walker - 2 wheels, Wheelchair - manual, Bedside commode(rollator)   Functional History: Prior Function Level of Independence: Independent with assistive device(s) Comments: using Rollator for ambulation   Functional Status:  Mobility: Bed Mobility Overal bed mobility: Needs Assistance Bed Mobility: Supine to Sit, Sit to Supine Supine to sit: Min assist Sit to supine: Min assist General bed mobility comments: minA for pt to pullup against therapist to bring hips to EoB, mina for management of LE into bed Transfers Overall transfer level: Needs assistance Equipment used: Rolling walker (2 wheeled) Transfers: Sit to/from Stand Sit to Stand: Mod assist, From elevated surface, +2 physical assistance General transfer comment: Pt unable to power up from low bed surface. With elevated bed surface, he was able to clear hips but not succesful with power up on first attempt. On second attempt from elevated surface, he was able to power up with cues to activate  glutes. Once in standing, he requires verbal and tactile cues for anterior pelvic tilt and upright posture. Ambulation/Gait General Gait Details: unable to attempt today    ADL: ADL Overall ADL's : Needs assistance/impaired Eating/Feeding: Independent Grooming: Supervision/safety, Sitting Upper Body Bathing: Supervision/ safety, Sitting Lower Body Bathing: Moderate assistance, Sitting/lateral leans Upper Body Dressing : Supervision/safety, Sitting Lower Body Dressing: Maximal assistance, Sitting/lateral leans  Cognition: Cognition Overall Cognitive Status: Within Functional Limits for tasks assessed Orientation Level: Oriented X4 Cognition Arousal/Alertness: Awake/alert Behavior During Therapy: WFL for tasks assessed/performed Overall Cognitive Status: Within Functional Limits for tasks assessed  Physical Exam: Blood pressure (!) 157/74, pulse 73,  temperature 98.2 F (36.8 C), temperature source Oral, resp. rate 14, height 6\' 4"  (1.93 m), weight 118.7 kg, SpO2 97 %. Physical Exam  Vitals reviewed. Constitutional: He appears well-developed.  Obese  HENT:  Head: Normocephalic and atraumatic.  Eyes: EOM are normal. Right eye exhibits no discharge. Left eye exhibits no discharge.  Respiratory: Effort normal. No respiratory distress.  GI: He exhibits no distension.  Musculoskeletal:     Comments: Right BKA with edema and tenderness  Neurological: He is alert.  Patient is alert and distress follows full commands.  Motor: Bilateral upper extremities: 5/5 proximal distal, left stronger than right Left lower extremity: Hip flexion, knee extension 5/5, ankle dorsiflexion 3+/5 Right lower extremity: Limited due to pain Sensation absent to light touch left foot  Skin:  Amputation site is dressed appropriately tender  Psychiatric: He has a normal mood and affect. His behavior is normal.    Results for orders placed or performed during the hospital encounter of 12/01/18 (from the  past 48 hour(s))  CBG monitoring, ED     Status: Abnormal   Collection Time: 12/01/18  1:56 PM  Result Value Ref Range   Glucose-Capillary 105 (H) 70 - 99 mg/dL  Basic metabolic panel     Status: Abnormal   Collection Time: 12/01/18  2:14 PM  Result Value Ref Range   Sodium 136 135 - 145 mmol/L   Potassium 3.9 3.5 - 5.1 mmol/L   Chloride 96 (L) 98 - 111 mmol/L   CO2 25 22 - 32 mmol/L   Glucose, Bld 119 (H) 70 - 99 mg/dL   BUN 48 (H) 8 - 23 mg/dL   Creatinine, Ser 9.30 (H) 0.61 - 1.24 mg/dL   Calcium 9.2 8.9 - 10.3 mg/dL   GFR calc non Af Amer 5 (L) >60 mL/min   GFR calc Af Amer 6 (L) >60 mL/min   Anion gap 15 5 - 15    Comment: Performed at [facility], [address] [place], [place] [address]  CBC with Differential     Status: Abnormal   Collection Time: 12/01/18  2:14 PM  Result Value Ref Range   WBC 14.8 (H) 4.0 - 10.5 K/uL   RBC 2.80 (L) 4.22 - 5.81 MIL/uL   Hemoglobin 8.4 (L) 13.0 - 17.0 g/dL   HCT 27.6 (L) 39.0 - 52.0 %   MCV 98.6 80.0 - 100.0 fL   MCH 30.0 26.0 - 34.0 pg   MCHC 30.4 30.0 - 36.0 g/dL   RDW 15.6 (H) 11.5 - 15.5 %   Platelets 333 150 - 400 K/uL   nRBC 0.0 0.0 - 0.2 %   Neutrophils Relative % 81 %   Neutro Abs 12.0 (H) 1.7 - 7.7 K/uL   Lymphocytes Relative 9 %   Lymphs Abs 1.3 0.7 - 4.0 K/uL   Monocytes Relative 8 %   Monocytes Absolute 1.1 (H) 0.1 - 1.0 K/uL   Eosinophils Relative 1 %   Eosinophils Absolute 0.1 0.0 - 0.5 K/uL   Basophils Relative 0 %   Basophils Absolute 0.1 0.0 - 0.1 K/uL   Immature Granulocytes 1 %   Abs Immature Granulocytes 0.19 (H) 0.00 - 0.07 K/uL    Comment: Performed at [facility] [address] [place], [place] [address]  Lactic acid, plasma     Status: None   Collection Time: 12/01/18  2:51 PM  Result Value Ref Range   Lactic Acid, Venous 1.3 0.5 - 1.9 mmol/L  Comment: Performed at [facility], [place] [address] [place], [place] [address]  Blood culture (routine x 2)     Status: None  (Preliminary result)   Collection Time: 12/01/18  2:51 PM  Result Value Ref Range   Specimen Description BLOOD RIGHT ANTECUBITAL    Special Requests      BOTTLES DRAWN AEROBIC AND ANAEROBIC Blood Culture adequate volume   Culture      NO GROWTH < 24 HOURS Performed at [facility], [place] [address], [place], [place] [address]    Report Status PENDING   Blood culture (routine x 2)     Status: None (Preliminary result)   Collection Time: 12/01/18  3:09 PM  Result Value Ref Range   Specimen Description BLOOD RIGHT HAND    Special Requests      BOTTLES DRAWN AEROBIC AND ANAEROBIC Blood Culture results may not be optimal due to an inadequate volume of blood received in culture bottles   Culture      NO GROWTH < 24 HOURS Performed at [facility] [address], [place], [place] [address]    Report Status PENDING   SARS Coronavirus 2 [facility] order, Performed in [place] hospital lab)     Status: None   Collection Time: 12/01/18  5:03 PM  Result Value Ref Range   SARS Coronavirus 2 NEGATIVE NEGATIVE    Comment: (NOTE) If result is NEGATIVE SARS-CoV-2 target nucleic acids are NOT DETECTED. The SARS-CoV-2 RNA is generally detectable in upper and lower  respiratory specimens during the acute phase of infection. The lowest  concentration of SARS-CoV-2 viral copies this assay can detect is 250  copies / mL. A negative result does not preclude SARS-CoV-2 infection  and should not be used as the sole basis for treatment or other  patient management decisions.  A negative result may occur with  improper specimen collection / handling, submission of specimen other  than nasopharyngeal swab, presence of viral mutation(s) within the  areas targeted by this assay, and inadequate number of viral copies  (<250 copies / mL). A negative result must be combined with clinical  observations, patient history, and epidemiological information. If result is POSITIVE SARS-CoV-2 target  nucleic acids are DETECTED. The SARS-CoV-2 RNA is generally detectable in upper and lower  respiratory specimens dur ing the acute phase of infection.  Positive  results are indicative of active infection with SARS-CoV-2.  Clinical  correlation with patient history and other diagnostic information is  necessary to determine patient infection status.  Positive results do  not rule out bacterial infection or co-infection with other viruses. If result is PRESUMPTIVE POSTIVE SARS-CoV-2 nucleic acids MAY BE PRESENT.   A presumptive positive result was obtained on the submitted specimen  and confirmed on repeat testing.  While 2019 novel coronavirus  (SARS-CoV-2) nucleic acids may be present in the submitted sample  additional confirmatory testing may be necessary for epidemiological  and / or clinical management purposes  to differentiate between  SARS-CoV-2 and other Sarbecovirus currently known to infect humans.  If clinically indicated additional testing with an alternate test  methodology [phone]) is advised. The SARS-CoV-2 RNA is generally  detectable in upper and lower respiratory sp ecimens during the acute  phase of infection. The expected result is Negative. Fact Sheet for Patients:  StrictlyIdeas.no Fact Sheet for Healthcare Providers: BankingDealers.co.za This test is not yet approved or cleared by the [place] FDA and has been authorized for detection  and/or diagnosis of SARS-CoV-2 by FDA under an Emergency Use Authorization (EUA).  This EUA will remain in effect (meaning this test can be used) for the duration of the COVID-19 declaration under Section 564(b)(1) of the Act, 21 U.S.C. section 360bbb-3(b)(1), unless the authorization is terminated or revoked sooner. Performed at [facility], [place] [address] [place], [place] [address]   Glucose, capillary     Status: None   Collection Time: 12/01/18  7:06 PM  Result  Value Ref Range   Glucose-Capillary 92 70 - 99 mg/dL  Type and screen Lydia     Status: None   Collection Time: 12/01/18  8:30 PM  Result Value Ref Range   ABO/RH(D) A POS    Antibody Screen NEG    Sample Expiration 12/04/2018    Unit Number [id number]    Blood Component Type RED CELLS,LR    Unit division 00    Status of Unit ISSUED,FINAL    Transfusion Status OK TO TRANSFUSE    Crossmatch Result Compatible    Unit Number [id number]    Blood Component Type RED CELLS,LR    Unit division 00    Status of Unit ISSUED,FINAL    Transfusion Status OK TO TRANSFUSE    Crossmatch Result      Compatible Performed at [facility], [place] [address], [place], [place] [address]   I-STAT 4, (NA,K, GLUC, HGB,HCT)     Status: Abnormal   Collection Time: 12/01/18  8:45 PM  Result Value Ref Range   Sodium 136 135 - 145 mmol/L   Potassium 4.3 3.5 - 5.1 mmol/L   Glucose, Bld 142 (H) 70 - 99 mg/dL   HCT 36.0 (L) 39.0 - 52.0 %   Hemoglobin 12.2 (L) 13.0 - 17.0 g/dL  Prepare RBC     Status: None   Collection Time: 12/01/18  8:50 PM  Result Value Ref Range   Order Confirmation      ORDER PROCESSED BY BLOOD BANK Performed at [facility], [place] [address] [place], [place] [address]   Glucose, capillary     Status: Abnormal   Collection Time: 12/01/18  9:26 PM  Result Value Ref Range   Glucose-Capillary 130 (H) 70 - 99 mg/dL  Lactic acid, plasma     Status: None   Collection Time: 12/01/18  9:50 PM  Result Value Ref Range   Lactic Acid, Venous 1.0 0.5 - 1.9 mmol/L    Comment: Performed at [facility] [address] [place], [place] [address]  Hemoglobin and hematocrit, blood     Status: Abnormal   Collection Time: 12/01/18  9:51 PM  Result Value Ref Range   Hemoglobin 8.1 (L) 13.0 - 17.0 g/dL   HCT 25.8 (L) 39.0 - 52.0 %    Comment: Performed at [facility] [address], [place], [place] [address]  Prepare RBC     Status: None    Collection Time: 12/01/18 10:20 PM  Result Value Ref Range   Order Confirmation      ORDER PROCESSED BY BLOOD BANK BB SAMPLE OR UNITS ALREADY AVAILABLE Performed at [facility], [place] [address] [place],  [address]   Glucose, capillary     Status: Abnormal   Collection Time: 12/02/18  6:28 AM  Result Value Ref Range   Glucose-Capillary 145 (H) 70 - 99 mg/dL  Basic metabolic panel     Status: Abnormal   Collection Time:  12/02/18  8:13 AM  Result Value Ref Range   Sodium 136 135 - 145 mmol/L   Potassium 4.7 3.5 - 5.1 mmol/L   Chloride 94 (L) 98 - 111 mmol/L   CO2 21 (L) 22 - 32 mmol/L   Glucose, Bld 144 (H) 70 - 99 mg/dL   BUN 58 (H) 8 - 23 mg/dL   Creatinine, Ser 10.18 (H) 0.61 - 1.24 mg/dL   Calcium 8.5 (L) 8.9 - 10.3 mg/dL   GFR calc non Af Amer 5 (L) >60 mL/min   GFR calc Af Amer 5 (L) >60 mL/min   Anion gap 21 (H) 5 - 15    Comment: Performed at [facility] [address], [place], [place] [address]  CBC     Status: Abnormal   Collection Time: 12/02/18  8:13 AM  Result Value Ref Range   WBC 11.2 (H) 4.0 - 10.5 K/uL   RBC 2.90 (L) 4.22 - 5.81 MIL/uL   Hemoglobin 8.7 (L) 13.0 - 17.0 g/dL   HCT 27.6 (L) 39.0 - 52.0 %   MCV 95.2 80.0 - 100.0 fL   MCH 30.0 26.0 - 34.0 pg   MCHC 31.5 30.0 - 36.0 g/dL   RDW 17.6 (H) 11.5 - 15.5 %   Platelets 311 150 - 400 K/uL   nRBC 0.0 0.0 - 0.2 %    Comment: Performed at [facility], [place] [address], [place], [place] [address]  Glucose, capillary     Status: Abnormal   Collection Time: 12/02/18 11:05 AM  Result Value Ref Range   Glucose-Capillary 182 (H) 70 - 99 mg/dL  Hepatitis B surface antigen     Status: None   Collection Time: 12/02/18 12:47 PM  Result Value Ref Range   Hepatitis B Surface Ag Negative Negative    Comment: (NOTE) A courtesy copy of this report has been sent to [facility], [place], [phone] Performed At: [facility] [place], [place]  [id number] Rush Farmer MD KG:[id number]   Glucose, capillary     Status: Abnormal   Collection Time: 12/02/18  5:34 PM  Result Value Ref Range   Glucose-Capillary 159 (H) 70 - 99 mg/dL  Glucose, capillary     Status: Abnormal   Collection Time: 12/02/18  9:55 PM  Result Value Ref Range   Glucose-Capillary 239 (H) 70 - 99 mg/dL  Basic metabolic panel     Status: Abnormal   Collection Time: 12/03/18  3:25 AM  Result Value Ref Range   Sodium 135 135 - 145 mmol/L   Potassium 3.8 3.5 - 5.1 mmol/L    Comment: DELTA CHECK NOTED   Chloride 95 (L) 98 - 111 mmol/L   CO2 25 22 - 32 mmol/L   Glucose, Bld 216 (H) 70 - 99 mg/dL   BUN 28 (H) 8 - 23 mg/dL   Creatinine, Ser 6.26 (H) 0.61 - 1.24 mg/dL    Comment: DELTA CHECK NOTED   Calcium 8.4 (L) 8.9 - 10.3 mg/dL   GFR calc non Af Amer 8 (L) >60 mL/min   GFR calc Af Amer 10 (L) >60 mL/min   Anion gap 15 5 - 15    Comment: Performed at [facility] [address] [place], [place] [address]  CBC     Status: Abnormal   Collection Time: 12/03/18  3:25 AM  Result Value Ref Range   WBC 7.2 4.0 - 10.5 K/uL   RBC 2.62 (L)  4.22 - 5.81 MIL/uL   Hemoglobin 8.0 (L) 13.0 - 17.0 g/dL   HCT 24.4 (L) 39.0 - 52.0 %   MCV 93.1 80.0 - 100.0 fL   MCH 30.5 26.0 - 34.0 pg   MCHC 32.8 30.0 - 36.0 g/dL   RDW 17.1 (H) 11.5 - 15.5 %   Platelets 296 150 - 400 K/uL   nRBC 0.0 0.0 - 0.2 %    Comment: Performed at [facility] [address] [place], [place] [address]  Glucose, capillary     Status: Abnormal   Collection Time: 12/03/18  7:05 AM  Result Value Ref Range   Glucose-Capillary 133 (H) 70 - 99 mg/dL   Dg Chest Port 1 View  Result Date: 12/01/2018 CLINICAL DATA:  Wheezing EXAM: PORTABLE CHEST 1 VIEW COMPARISON:  February 11, 2017. FINDINGS: There is no edema or consolidation. Heart is mildly enlarged with pulmonary vascularity normal. No adenopathy. There is aortic atherosclerosis. No evident bone lesions. IMPRESSION: Mild cardiac  enlargement. No edema or consolidation. Aortic Atherosclerosis (ICD10-I70.0). Electronically Signed   By: Lowella Grip III M.D.   On: 12/01/2018 14:13   Dg Foot Complete Right  Result Date: 12/01/2018 CLINICAL DATA:  68 year old male with a history of foot wound EXAM: RIGHT FOOT COMPLETE - 3+ VIEW COMPARISON:  None. FINDINGS: Soft tissue swelling with subcutaneous gas of the forefoot, predominantly within the plantar soft tissues. No displaced fracture. Degenerative changes of the forefoot and midfoot. Degenerative changes of the hindfoot with enthesopathic changes at the Achilles insertion and plantar fascia insertion. Dense calcifications of the tibial plantar arteries. IMPRESSION: Soft tissue swelling with subcutaneous gas, concerning for necrotizing infection. Atherosclerosis. Degenerative changes of the foot. Electronically Signed   By: Corrie Mckusick D.O.   On: 12/01/2018 14:28   Vas US Carotid  Result Date: 12/02/2018 Carotid Arterial Duplex Study Indications:  Bruit. Risk Factors: Hypertension, Diabetes. Performing Technologist: Maudry Mayhew MHA, RDMS, RVT, RDCS  Examination Guidelines: A complete evaluation includes B-mode imaging, spectral Doppler, color Doppler, and power Doppler as needed of all accessible portions of each vessel. Bilateral testing is considered an integral part of a complete examination. Limited examinations for reoccurring indications may be performed as noted.  Right Carotid Findings: +----------+--------+-------+--------+--------------------------------+--------+             PSV cm/s EDV     Stenosis Describe                         Comments                       cm/s                                                        +----------+--------+-------+--------+--------------------------------+--------+  CCA Prox   131      21                                                          +----------+--------+-------+--------+--------------------------------+--------+  CCA  Distal 87       19  smooth and heterogenous                    +----------+--------+-------+--------+--------------------------------+--------+  ICA Prox   136      30               smooth, heterogenous and                                                         calcific                                   +----------+--------+-------+--------+--------------------------------+--------+  ICA Distal 110      21                                                          +----------+--------+-------+--------+--------------------------------+--------+  ECA        143                                                                  +----------+--------+-------+--------+--------------------------------+--------+ +----------+--------+-------+----------------+-------------------+             PSV cm/s EDV cms Describe         Arm Pressure (mmHG)  +----------+--------+-------+----------------+-------------------+  Subclavian 146              Multiphasic, WNL                      +----------+--------+-------+----------------+-------------------+ +---------+--------+--+--------+--+---------+  Vertebral PSV cm/s 86 EDV cm/s 27 Antegrade  +---------+--------+--+--------+--+---------+  Left Carotid Findings: +----------+--------+-------+--------+--------------------------------+--------+             PSV cm/s EDV     Stenosis Describe                         Comments                       cm/s                                                        +----------+--------+-------+--------+--------------------------------+--------+  CCA Prox   65       15                                                          +----------+--------+-------+--------+--------------------------------+--------+  CCA Distal 86       25               smooth, heterogenous and  calcific                                   +----------+--------+-------+--------+--------------------------------+--------+   ICA Prox   308      47               calcific and irregular                     +----------+--------+-------+--------+--------------------------------+--------+  ICA Distal 116      33                                                          +----------+--------+-------+--------+--------------------------------+--------+  ECA        201      8                smooth and heterogenous                    +----------+--------+-------+--------+--------------------------------+--------+ +----------+--------+--------+----------+-------------------+  Subclavian PSV cm/s EDV cm/s Describe   Arm Pressure (mmHG)  +----------+--------+--------+----------+-------------------+             196               Monophasic                      +----------+--------+--------+----------+-------------------+ +---------+--------+--+--------+--+---------+  Vertebral PSV cm/s 55 EDV cm/s 16 Antegrade  +---------+--------+--+--------+--+---------+  Summary: Right Carotid: Velocities in the right ICA are consistent with a 1-39% stenosis. Left Carotid: Velocities in the left ICA are consistent with a 40-59% stenosis. Vertebrals:  Bilateral vertebral arteries demonstrate antegrade flow. Subclavians: Left subclavian artery was monophasic, suggestive of possible              proximal obstruction. Normal flow hemodynamics were seen in the              right subclavian artery. *See table(s) above for measurements and observations.  Electronically signed by Deitra Mayo MD on 12/02/2018 at 10:55:29 AM.    Final        Medical Problem List and Plan: 1.   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  Antithrombotics: -DVT/anticoagulation:  Subcutaneous heparin  -antiplatelet therapy:  Plavix 75 mg daily 3. 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 Mood:   trazodone 50 mg daily, melatonin 9 mg daily at bedtime  -antipsychotic agents: Risperdal 0.25 mg daily 5. Neuropsych: This patient is capable of making decisions on his own behalf. 6. Skin/Wound Care:  Routine skin checks 7. Fluids/Electrolytes/Nutrition:  Routine I/Os.   8. Acute on chronic anemia. Follow-up CBC with HD. 9. End-stage renal disease. Continue hemodialysis 10. Diabetes mellitus and peripheral neuropathy.Lantus insulin 85 units daily at bedtime.diabetic teaching.  Monitor with increased mobility. 11. Hypertension.  Monitor with increased mobility. 12. COPD with remote tobacco abuse. Continue nebulizer as needed 13. Hyperlipidemia. Lipitor 14. Hypothyroidism. Synthroid 15. GERD. Protonix 16. Constipation. Laxative assistance  Post Admission Physician Evaluation: 1. Preadmission assessment reviewed and changes made below. 2. Functional deficits secondary  to right BKA. 3. Patient is admitted  to receive collaborative, interdisciplinary care between the physiatrist, rehab nursing staff, and therapy team. 4. Patient's level of medical complexity and substantial therapy needs in context of that medical necessity cannot be provided at a lesser intensity of care such as a SNF. 5. Patient has experienced substantial functional loss from his/her baseline which was documented above under the "Functional History" and "Functional Status" headings.  Judging by the patient's diagnosis, physical exam, and functional history, the patient has potential for functional progress which will result in measurable gains while on inpatient rehab.  These gains will be of substantial and practical use upon discharge  in facilitating mobility and self-care at the household level. 6. Physiatrist will provide 24 hour management of medical needs as well as oversight of the therapy plan/treatment and provide guidance as appropriate regarding the interaction of the two. 7. 24  hour rehab nursing will assist with bowel management, safety, skin/wound care, disease management, pain management and patient education  and help integrate therapy concepts, techniques, pre-prosthetic education, etc. 8. PT will assess and treat for/with: Lower extremity strength, range of motion, stamina, balance, functional mobility, safety, adaptive techniques and equipment, wound care, coping skills, pain control, education. Goals are: Supervision/Mod I. 9. OT will assess and treat for/with: ADL's, functional mobility, safety, upper extremity strength, adaptive techniques and equipment, wound mgt, ego support, and community reintegration.   Goals are: Supervision/Mod I. Therapy may not proceed with showering this patient. 10. Case Management and Social Worker will assess and treat for psychological issues and discharge planning. 11. Team conference will be held weekly to assess progress toward goals and to determine barriers to discharge. 12. Patient will receive at least 3 hours of therapy per day at least 5 days per week. 13. ELOS: 8-13 days.       14. Prognosis:  good  I have personally performed a face to face diagnostic evaluation, including, but not limited to relevant history and physical exam findings, of this patient and developed relevant assessment and plan.  Additionally, I have reviewed and concur with the physician assistant's documentation above.  Delice Lesch, MD, ABPMR Lavon Paganini Angiulli, PA-C 12/03/2018

## 2018-12-03 NOTE — Discharge Summary (Signed)
[facility] Discharge Summary   Patient ID:  [name] MRN: [id number] DOB/AGE: 1951/07/04 68 y.o.  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 Anemia   . Anxiety   . Arthritis   . Chronic kidney disease   . COPD (chronic obstructive pulmonary disease) ([place])    Pt denies  . Depression   . Diabetes mellitus without complication ([place])   . GERD (gastroesophageal reflux disease)   . H/O hiatal hernia   . Headache(784.0)    Hx: Migraines  . Hypertension   . Neuropathy   . Numbness of toes    toes and feet  . Pneumonia    Hx: of several times  . Renal insufficiency   . Shortness of breath dyspnea   . Type 2 diabetes mellitus (HCC)     Procedure(s): RIGHT AMPUTATION BELOW KNEE  Discharged Condition: good  HPI: History of Present Illness: This is a 68 y.o. male who comes to the ER today with an infected foot.  He states that a little over a week ago, he turned his right great toe the wrong way and developed a black line on the bottom of the toe.  He states that it got progressively worse as the week went on.   Gas gangrene right foot.  He will need emergent BKA today.   Hospital Course:  [name] is a 68 y.o. male is S/P  Procedure(s): RIGHT AMPUTATION BELOW KNEE  Right BKA well perfused and appears viable.  Summary: Right Carotid: Velocities in the right ICA are consistent with a 1-39% stenosis.  Left Carotid: Velocities in the left ICA are consistent with a 40-59% stenosis.  Vertebrals: Bilateral vertebral arteries demonstrate antegrade flow. Subclavians: Left subclavian artery was  monophasic, suggestive of possible proximal obstruction. Normal flow hemodynamics were seen in the right subclavian artery.  We will plan for repeat carotid duplex in 6- 1 year.  He is symptomatic.   [employer] will be consulted for stump sock.  Discharge to CIR for rehabilitation.  Consults:  Treatment Team:  [name], MD Donato Heinz, MD  Significant Diagnostic Studies: CBC Lab Results  Component Value Date   WBC 7.2 12/03/2018   HGB 8.0 (L) 12/03/2018   HCT 24.4 (L) 12/03/2018   MCV 93.1 12/03/2018   PLT 296 12/03/2018    BMET    Component Value Date/Time   NA 135 12/03/2018 0325   NA 134 02/08/2017 1403   K 3.8 12/03/2018 0325   CL 95 (L) 12/03/2018 0325   CO2 25 12/03/2018 0325   GLUCOSE 216 (H) 12/03/2018 0325   BUN 28 (H) 12/03/2018 0325   BUN 67 (H) 02/08/2017 1403   CREATININE 6.26 (H) 12/03/2018 0325   CALCIUM 8.4 (L) 12/03/2018 0325   GFRNONAA 8 (L) 12/03/2018 0325   GFRAA 10 (L) 12/03/2018 0325   COAG Lab Results  Component Value Date   INR 1.1 02/08/2017   INR 0.94 11/27/2010   INR 0.97 08/24/2010     Disposition:  Discharge to :Rehab Discharge Instructions    Call MD for:  redness, tenderness, or signs of infection (pain, swelling, bleeding, redness, odor or green/yellow discharge around incision site)   Complete  by:  As directed    Call MD for:  severe or increased pain, loss or decreased feeling  in affected limb(s)   Complete by:  As directed    Call MD for:  temperature >100.5   Complete by:  As directed    Resume previous diet   Complete by:  As directed      Allergies as of 12/03/2018      Reactions   Morphine And Related Other (See Comments)   Not effective   Zaroxolyn [metolazone] Other (See Comments)   Reaction not recalled by the patient      Medication List    TAKE these medications   ARTIFICIAL TEAR OP Place 1 drop into both eyes every 6 (six) hours as needed (dry eyes).    atorvastatin 80 MG tablet Commonly known as:  LIPITOR Take 80 mg by mouth at bedtime.   calcium acetate 667 MG capsule Commonly known as:  PHOSLO Take 2 capsules (1,334 mg total) by mouth 3 (three) times daily with meals. What changed:    how much to take  when to take this  additional instructions   cephALEXin 500 MG capsule Commonly known as:  KEFLEX Take 500 mg by mouth 4 (four) times daily. FOR 10 DAYS   cinacalcet 30 MG tablet Commonly known as:  SENSIPAR Take 30 mg by mouth every evening.   clopidogrel 75 MG tablet Commonly known as:  PLAVIX Take 75 mg by mouth daily.   levothyroxine 125 MCG tablet Commonly known as:  SYNTHROID Take 125 mcg by mouth daily before breakfast.   Melatonin 10 MG Tabs Take 10 mg by mouth at bedtime.   nortriptyline 25 MG capsule Commonly known as:  PAMELOR Take 50 mg by mouth at bedtime.   polyethylene glycol 17 g packet Commonly known as:  MIRALAX / GLYCOLAX Take 17 g by mouth daily as needed for moderate constipation.   pregabalin 150 MG capsule Commonly known as:  LYRICA Take 150 mg by mouth 3 (three) times daily.   risperiDONE 0.25 MG tablet Commonly known as:  RISPERDAL Take 0.25 mg by mouth daily.   SALINE NASAL SPRAY NA Place 2 sprays into both nostrils as needed (seasonal allergies).   silver sulfADIAZINE 1 % cream Commonly known as:  SILVADENE Apply 1 application topically 2 (two) times daily. TO BOTH FEET   Toujeo SoloStar 300 UNIT/ML Sopn Generic drug:  Insulin Glargine (1 Unit Dial) Inject 85 Units as directed at bedtime.   traZODone 50 MG tablet Commonly known as:  DESYREL Take 50 mg by mouth daily.   Ventolin HFA 108 (90 Base) MCG/ACT inhaler Generic drug:  albuterol Inhale 2 puffs into the lungs every 6 (six) hours as needed for wheezing or shortness of breath.      Verbal and written Discharge instructions given to the patient. Wound care per Discharge AVS Follow-up Information    [name], MD Follow up in 4 week(s).   Specialties:  Vascular Surgery, Cardiology Contact information: [address] [place] [address] [phone]           Signed: Roxy Horseman 12/03/2018, 12:10 PM

## 2018-12-03 NOTE — IPOC Note (Signed)
Individualized overall Plan of Care (IPOC) Patient Details Name: [name] MRN: [id number] DOB: September 14, 1950  Admitting Diagnosis: Right BKA  Hospital Problems: Active Problems:   Right below-knee amputee ([place])   Neuropathic pain   S/P unilateral BKA (below knee amputation), right (HCC)   Sleep disturbance   Type 2 diabetes mellitus with peripheral neuropathy (HCC)   Benign essential HTN   Acute blood loss anemia   Anemia of chronic disease   Functional Problem List: Nursing Bladder, Bowel, Edema, Endurance, Medication Management, Pain, Safety, Skin Integrity  PT Balance, Behavior, Sensory, Edema, Endurance, Motor, Pain  OT Balance, Endurance, Motor, Pain, Safety, Sensory, Skin Integrity  SLP    TR         Basic ADL's: OT Grooming, Bathing, Dressing, Toileting     Advanced  ADL's: OT       Transfers: PT Bed Mobility, Bed to Chair, Teacher, early years/pre, Tub/Shower     Locomotion: PT Ambulation, Wheelchair Mobility     Additional Impairments: OT None  SLP        TR      Anticipated Outcomes Item Anticipated Outcome  Self Feeding    Swallowing      Basic self-care  Supervision  Toileting  Supervision   Bathroom Transfers Supervision  Bowel/Bladder  patient will be continent of bowel and bladder with min assist  Transfers  S basic and car transfers  Locomotion  modified independent w/c x 150' controlled, x 50' home, S community 150' and up/down ramp (for home entry); gait TBD  Communication     Cognition     Pain  pain will be equal to or less than 4/10 with min assist  Safety/Judgment  patient will be free from falls/injury and displaying appropriate safety judgement   Therapy Plan: PT Intensity: Minimum of 1-2 x/day ,45 to 90 minutes PT Frequency: 5 out of 7 days PT Duration Estimated Length of Stay: 14-18 OT Intensity: Minimum of 1-2 x/day, 45 to 90 minutes OT Frequency: 5 out of 7 days OT Duration/Estimated Length of Stay: 14-18 days      Team Interventions: Nursing Interventions Patient/Family Education, Pain Management, Medication Management, Skin Care/Wound Management, Discharge Planning  PT interventions Ambulation/gait training, Community reintegration, DME/adaptive equipment instruction, Neuromuscular re-education, Psychosocial support, UE/LE Strength taining/ROM, Wheelchair propulsion/positioning, Training and development officer, Discharge planning, Functional electrical stimulation, Pain management, Therapeutic Activities, UE/LE Coordination activities, Functional mobility training, Patient/family education, Splinting/orthotics, Therapeutic Exercise, Visual/perceptual remediation/compensation  OT Interventions Training and development officer, Community reintegration, Discharge planning, Disease mangement/prevention, DME/adaptive equipment instruction, Functional mobility training, Neuromuscular re-education, Pain management, Patient/family education, Psychosocial support, Self Care/advanced ADL retraining, Skin care/wound managment, Splinting/orthotics, Therapeutic Activities, Therapeutic Exercise, UE/LE Strength taining/ROM, Wheelchair propulsion/positioning  SLP Interventions    TR Interventions    SW/CM Interventions Discharge Planning, Psychosocial Support, Patient/Family Education   Barriers to Discharge MD  Medical stability, Wound care, Weight, Hemodialysis and Weight bearing restrictions  Nursing      PT      OT Hemodialysis, Wound Care    SLP      SW       Team Discharge Planning: Destination: PT-Home ,OT- Home , SLP-  Projected Follow-up: PT-Home health PT, OT-  24 hour supervision/assistance, Home health OT, SLP-  Projected Equipment Needs: PT-To be determined, Wheelchair (measurements), Wheelchair cushion (measurements), OT- None recommended by OT(pt has all recommended equipment), SLP-  Equipment Details: PT-22' wide w/c (depth TBD); owns a RW and rollator, OT-  Patient/family involved in discharge planning: PT-  Patient,  OT-Patient, SLP-   MD ELOS: 11-15 days. Medical Rehab Prognosis:  Good Assessment: 68 year old right-handed male history of COPD who quit smoking 6 years ago, end-stage renal disease with hemodialysis Tuesday Thursday and Saturday at [facility] in [facility], diabetes mellitus peripheral neuropathy, hypertension and peripheral vascular disease maintained on Plavix. Presented 12/01/2018 with ischemic right foot. Per report patient turned his right great toe approximately 1 week prior and developed a black line on the bottom of the toe.  X-rays were performed which showed gaseous changes, concerning for necrotizing infection.  Limb was not felt to be salvageable. 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 [name].Hospital course complicated by pain.  Hemodialysis ongoing as per renal services. A carotid Dopplers done for evaluation of bruit showed left ICA stenosis 40-59% and monitored.  Hospital course further complicated by acute on chronic anemia.  Patient notes that he had a fall prior to this and has had gluteal pain since that time.  Patient with resulting functional deficits with mobility, tranfers, self-care.  Will set goals for Supervision with PT/OT.   Due to the current state of emergency, patients may not be receiving their 3-hours of Medicare-mandated therapy.  See Team Conference Notes for weekly updates to the plan of care

## 2018-12-03 NOTE — Progress Notes (Addendum)
Inpatient Rehabilitation Admissions Coordinator  Inpatient Rehab Consult received. I met with patient at the bedside for rehabilitation assessment. We discussed goals and expectations of an inpatient rehab admission.  He prefers an inpt rehab admit rather than SNF. He is in agreement to admit today and bed is available. RN CM, Drue Dun, made aware and she will obtain d/c orders to admit pt to inpt rehab today. I will make the arrangements.  Danne Baxter, RN, MSN Rehab Admissions Coordinator [phone] 12/03/2018 12:14 PM   I notified Freda Munro in [place] [facility] of planned admit to CIR today.

## 2018-12-03 NOTE — Progress Notes (Signed)
Orthopedic Tech Progress Note Patient Details:  [name] [birth year] [id number]  Patient ID: [name], male   DOB: 10-31-1950, 68 y.o.   MRN: [id number]   [place] Pink 12/03/2018, 8:54 [facility] [employer] for right stump sock.

## 2018-12-03 NOTE — H&P (Addendum)
Physical Medicine and Rehabilitation Admission H&P    Chief Complaint  Patient presents with  . Wound Check  chief complaint: Stomach pain  HPI: [name] is a 68 year old right-handed male history of COPD who quit smoking 6 years ago, end-stage renal disease with hemodialysis Tuesday Thursday and Saturday at [facility] in [facility], diabetes mellitus peripheral neuropathy, hypertension and peripheral vascular disease maintained on Plavix.  Per chart review and patient, patient lives with his wife. Independent ADLs and uses a Rollator for functional mobility. one level home with ramped entrance. Presented 12/01/2018 with ischemic right foot. Per report patient turned his right great toe approximately 1 week prior and developed a black line on the bottom of the toe.  X-rays were performed which showed gaseous changes, concerning for necrotizing infection.  Limb was not felt to be salvageable. 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 [name].Hospital course complicated by pain.  Hemodialysis ongoing as per renal services. A carotid Dopplers done for evaluation of bruit showed left ICA stenosis 40-59% and monitored.  Hospital course further complicated by acute on chronic anemia.  Patient notes that he had a fall prior to this and has had gluteal pain since that time.  He is unsure how he is going to be able to tolerate rehab.  Subcutaneous heparin for DVT prophylaxis. Therapy evaluation completed with recommendations of physical medicine rehabilitation consult. Patient was admitted for a comprehensive rehabilitation program.  Please see preadmission assessment from earlier today as well.  Review of Systems  HENT: Negative for hearing loss.   Eyes: Negative for blurred vision and double vision.  Respiratory: Positive for shortness of breath. Negative for cough.   Cardiovascular: Positive for leg swelling. Negative for chest pain and palpitations.  Gastrointestinal: Positive for  constipation. Negative for heartburn, nausea and vomiting.       GERD  Genitourinary: Negative for dysuria, flank pain and hematuria.  Musculoskeletal: Positive for back pain, joint pain and myalgias.  Skin: Negative for rash.  Neurological: Positive for tingling and headaches.       Phantom limb pain  Psychiatric/Behavioral: Positive for depression. The patient has insomnia.   All other systems reviewed and are negative.  Past Medical History:  Diagnosis Date  . Anemia   . Anxiety   . Arthritis   . Chronic kidney disease   . COPD (chronic obstructive pulmonary disease) ([place])    Pt denies  . Depression   . Diabetes mellitus without complication ([place])   . GERD (gastroesophageal reflux disease)   . H/O hiatal hernia   . Headache(784.0)    Hx: Migraines  . Hypertension   . Neuropathy   . Numbness of toes    toes and feet  . Pneumonia    Hx: of several times  . Renal insufficiency   . Shortness of breath dyspnea   . Type 2 diabetes mellitus ([place])    Past Surgical History:  Procedure Laterality Date  . AMPUTATION Right 12/01/2018   Procedure: RIGHT AMPUTATION BELOW KNEE;  Surgeon: [name], MD;  Location: [facility] OR;  Service: Vascular;  Laterality: Right;  . AV FISTULA PLACEMENT  2012      left arm   . AV FISTULA PLACEMENT Left 12/18/2012   Procedure: ARTERIOVENOUS (AV) FISTULA CREATION;  Surgeon: Angelia Mould, MD;  Location: [place];  Service: Vascular;  Laterality: Left;  . COLONOSCOPY  10/26/2011   Procedure: COLONOSCOPY;  Surgeon: Rogene Houston, MD;  [facility] ENDO  SU[facility];  Service: Endoscopy;  Laterality: N/A;  730  . EMBOLECTOMY Left 12/09/2012   Procedure: EMBOLECTOMY;  Surgeon: Serafina Mitchell, MD;  Location: [facility] CATH LAB;  Service: Cardiovascular;  Laterality: Left;  left arm venous embolization  . ESOPHAGOGASTRODUODENOSCOPY (EGD) WITH ESOPHAGEAL DILATION N/A 04/23/2013   Procedure: ESOPHAGOGASTRODUODENOSCOPY (EGD) WITH ESOPHAGEAL DILATION;  Surgeon: Rogene Houston, MD;  [facility];  Service: Endoscopy;  Laterality: N/A;  200-moved to 930   . FISTULA SUPERFICIALIZATION Left 06/18/2013   Procedure: FISTULA SUPERFICIALIZATION & LIGATION BRANCH X 1;  Surgeon: Mal Misty, MD;  Location: [place];  Service: Vascular;  Laterality: Left;  . HEMATOMA EVACUATION Right 02/11/2017   Procedure: EVACUATION HEMATOMA RIGHT GROIN, Repair of Right Pseudo-anerysm.;  Surgeon: [name], MD;  Location: [facility] OR;  Service: Vascular;  Laterality: Right;  . INGUINAL HERNIA REPAIR     ,  times   2  . INSERTION OF DIALYSIS CATHETER Left 12/18/2012   Procedure: INSERTION OF DIALYSIS CATHETER;  Surgeon: Angelia Mould, MD;  Location: [place];  Service: Vascular;  Laterality: Left;  . KNEE ARTHROSCOPY  2011   Right Knee  . LOWER EXTREMITY ANGIOGRAPHY N/A 02/11/2017   Procedure: Lower Extremity Angiography;  Surgeon: Lorretta Harp, MD;  Location: [place] CV LAB;  Service: Cardiovascular;  Laterality: N/A;  . PERIPHERAL ATHRECTOMY  02/11/2017  . PERIPHERAL VASCULAR ATHERECTOMY Left 02/11/2017   Procedure: Peripheral Vascular Atherectomy;  Surgeon: Lorretta Harp, MD;  Location: [place] CV LAB;  Service: Cardiovascular;  Laterality: Left;  . REVISON OF ARTERIOVENOUS FISTULA Left 0/32/1224   Procedure: PLICATION OF LEFT BRACHIOCEPHALIC ARTERIOVENOUS FISTULA;  Surgeon: Conrad Lawndale, MD;  Location: [place];  Service: Vascular;  Laterality: Left;  . SHUNTOGRAM N/A 12/09/2012   Procedure: fistulogram;  Surgeon: Serafina Mitchell, MD;  Location: [facility] CATH LAB;  Service: Cardiovascular;  Laterality: N/A;  . SHUNTOGRAM Left 06/03/2013   Procedure: Fistulogram;  Surgeon: Serafina Mitchell, MD;  Location: [facility] CATH LAB;  Service: Cardiovascular;  Laterality: Left;  . THROMBECTOMY W/ EMBOLECTOMY Left 12/11/2012   Procedure: THROMBECTOMY ARTERIOVENOUS FISTULA;  Surgeon: Serafina Mitchell, MD;  Location: [place];  Service: Vascular;  Laterality: Left;  . TONSILLECTOMY      Family History  Problem Relation Age of Onset  . Heart disease Father        Heart Disease before age 65   Social History:  reports that he quit smoking about 6 years ago. His smoking use included cigars. He quit after 10.00 years of use. He has never used smokeless tobacco. He reports that he does not drink alcohol or use drugs. Allergies:  Allergies  Allergen Reactions  . Morphine And Related Other (See Comments)    Not effective  . Zaroxolyn [Metolazone] Other (See Comments)    Reaction not recalled by the patient   Medications Prior to Admission  Medication Sig Dispense Refill  . albuterol (VENTOLIN HFA) 108 (90 Base) MCG/ACT inhaler Inhale 2 puffs into the lungs every 6 (six) hours as needed for wheezing or shortness of breath.    . ARTIFICIAL TEAR OP Place 1 drop into both eyes every 6 (six) hours as needed (dry eyes).    Marland Kitchen atorvastatin (LIPITOR) 80 MG tablet Take 80 mg by mouth at bedtime.  5  . calcium acetate (PHOSLO) 667 MG capsule Take 2 capsules (1,334 mg total) by mouth 3 (three) times daily with meals. (Patient taking differently: Take 1,334-2,668 mg  by mouth See admin instructions. Take 2,668 mg by mouth three times a day with meals and 1,334 mg with each snack)  10  . cephALEXin (KEFLEX) 500 MG capsule Take 500 mg by mouth 4 (four) times daily. FOR 10 DAYS    . cinacalcet (SENSIPAR) 30 MG tablet Take 30 mg by mouth every evening.    . clopidogrel (PLAVIX) 75 MG tablet Take 75 mg by mouth daily.    Marland Kitchen levothyroxine (SYNTHROID, LEVOTHROID) 125 MCG tablet Take 125 mcg by mouth daily before breakfast.    . Melatonin 10 MG TABS Take 10 mg by mouth at bedtime.    . polyethylene glycol (MIRALAX / GLYCOLAX) packet Take 17 g by mouth daily as needed for moderate constipation.     . pregabalin (LYRICA) 150 MG capsule Take 150 mg by mouth 3 (three) times daily.    Marland Kitchen SALINE NASAL SPRAY NA Place 2 sprays into both nostrils as needed (seasonal allergies).    . silver sulfADIAZINE  (SILVADENE) 1 % cream Apply 1 application topically 2 (two) times daily. TO BOTH FEET    . TOUJEO SOLOSTAR 300 UNIT/ML SOPN Inject 85 Units as directed at bedtime.   10  . nortriptyline (PAMELOR) 25 MG capsule Take 50 mg by mouth at bedtime.   10  . risperiDONE (RISPERDAL) 0.25 MG tablet Take 0.25 mg by mouth daily.    . traZODone (DESYREL) 50 MG tablet Take 50 mg by mouth daily.   10    Drug Regimen Review Drug regimen was reviewed and remains appropriate with no significant issues identified  Home: Home Living Family/patient expects to be discharged to:: Private residence Living Arrangements: Spouse/significant other Available Help at Discharge: Family, Available 24 hours/day Type of Home: House Home Access: Ramped entrance Home Layout: One level Bathroom Shower/Tub: Chiropodist: Standard Bathroom Accessibility: Yes Home Equipment: Tub bench, Grab bars - toilet, Grab bars - tub/shower, Hand held shower head, Walker - 2 wheels, Wheelchair - manual, Bedside commode(rollator)   Functional History: Prior Function Level of Independence: Independent with assistive device(s) Comments: using Rollator for ambulation   Functional Status:  Mobility: Bed Mobility Overal bed mobility: Needs Assistance Bed Mobility: Supine to Sit, Sit to Supine Supine to sit: Min assist Sit to supine: Min assist General bed mobility comments: minA for pt to pullup against therapist to bring hips to EoB, mina for management of LE into bed Transfers Overall transfer level: Needs assistance Equipment used: Rolling walker (2 wheeled) Transfers: Sit to/from Stand Sit to Stand: Mod assist, From elevated surface, +2 physical assistance General transfer comment: Pt unable to power up from low bed surface. With elevated bed surface, he was able to clear hips but not succesful with power up on first attempt. On second attempt from elevated surface, he was able to power up with cues to activate  glutes. Once in standing, he requires verbal and tactile cues for anterior pelvic tilt and upright posture. Ambulation/Gait General Gait Details: unable to attempt today    ADL: ADL Overall ADL's : Needs assistance/impaired Eating/Feeding: Independent Grooming: Supervision/safety, Sitting Upper Body Bathing: Supervision/ safety, Sitting Lower Body Bathing: Moderate assistance, Sitting/lateral leans Upper Body Dressing : Supervision/safety, Sitting Lower Body Dressing: Maximal assistance, Sitting/lateral leans  Cognition: Cognition Overall Cognitive Status: Within Functional Limits for tasks assessed Orientation Level: Oriented X4 Cognition Arousal/Alertness: Awake/alert Behavior During Therapy: WFL for tasks assessed/performed Overall Cognitive Status: Within Functional Limits for tasks assessed  Physical Exam: Blood pressure (!) 157/74, pulse 73,  temperature 98.2 F (36.8 C), temperature source Oral, resp. rate 14, height 6\' 4"  (1.93 m), weight 118.7 kg, SpO2 97 %. Physical Exam  Vitals reviewed. Constitutional: He appears well-developed.  Obese  HENT:  Head: Normocephalic and atraumatic.  Eyes: EOM are normal. Right eye exhibits no discharge. Left eye exhibits no discharge.  Respiratory: Effort normal. No respiratory distress.  GI: He exhibits no distension.  Musculoskeletal:     Comments: Right BKA with edema and tenderness  Neurological: He is alert.  Patient is alert and distress follows full commands.  Motor: Bilateral upper extremities: 5/5 proximal distal, left stronger than right Left lower extremity: Hip flexion, knee extension 5/5, ankle dorsiflexion 3+/5 Right lower extremity: Limited due to pain Sensation absent to light touch left foot  Skin:  Amputation site is dressed appropriately tender  Psychiatric: He has a normal mood and affect. His behavior is normal.    Results for orders placed or performed during the hospital encounter of 12/01/18 (from the  past 48 hour(s))  CBG monitoring, ED     Status: Abnormal   Collection Time: 12/01/18  1:56 PM  Result Value Ref Range   Glucose-Capillary 105 (H) 70 - 99 mg/dL  Basic metabolic panel     Status: Abnormal   Collection Time: 12/01/18  2:14 PM  Result Value Ref Range   Sodium 136 135 - 145 mmol/L   Potassium 3.9 3.5 - 5.1 mmol/L   Chloride 96 (L) 98 - 111 mmol/L   CO2 25 22 - 32 mmol/L   Glucose, Bld 119 (H) 70 - 99 mg/dL   BUN 48 (H) 8 - 23 mg/dL   Creatinine, Ser 9.30 (H) 0.61 - 1.24 mg/dL   Calcium 9.2 8.9 - 10.3 mg/dL   GFR calc non Af Amer 5 (L) >60 mL/min   GFR calc Af Amer 6 (L) >60 mL/min   Anion gap 15 5 - 15    Comment: Performed at [facility], [address], [place], [place] [address]  CBC with Differential     Status: Abnormal   Collection Time: 12/01/18  2:14 PM  Result Value Ref Range   WBC 14.8 (H) 4.0 - 10.5 K/uL   RBC 2.80 (L) 4.22 - 5.81 MIL/uL   Hemoglobin 8.4 (L) 13.0 - 17.0 g/dL   HCT 27.6 (L) 39.0 - 52.0 %   MCV 98.6 80.0 - 100.0 fL   MCH 30.0 26.0 - 34.0 pg   MCHC 30.4 30.0 - 36.0 g/dL   RDW 15.6 (H) 11.5 - 15.5 %   Platelets 333 150 - 400 K/uL   nRBC 0.0 0.0 - 0.2 %   Neutrophils Relative % 81 %   Neutro Abs 12.0 (H) 1.7 - 7.7 K/uL   Lymphocytes Relative 9 %   Lymphs Abs 1.3 0.7 - 4.0 K/uL   Monocytes Relative 8 %   Monocytes Absolute 1.1 (H) 0.1 - 1.0 K/uL   Eosinophils Relative 1 %   Eosinophils Absolute 0.1 0.0 - 0.5 K/uL   Basophils Relative 0 %   Basophils Absolute 0.1 0.0 - 0.1 K/uL   Immature Granulocytes 1 %   Abs Immature Granulocytes 0.19 (H) 0.00 - 0.07 K/uL    Comment: Performed at [facility] [address], [place], [place] [address]  Lactic acid, plasma     Status: None   Collection Time: 12/01/18  2:51 PM  Result Value Ref Range   Lactic Acid, Venous 1.3 0.5 - 1.9 mmol/L  Comment: Performed at [facility], [place] [address] [place], [place] [address]  Blood culture (routine x 2)     Status: None  (Preliminary result)   Collection Time: 12/01/18  2:51 PM  Result Value Ref Range   Specimen Description BLOOD RIGHT ANTECUBITAL    Special Requests      BOTTLES DRAWN AEROBIC AND ANAEROBIC Blood Culture adequate volume   Culture      NO GROWTH < 24 HOURS Performed at [facility], [place] [address] [place], [place] [address]    Report Status PENDING   Blood culture (routine x 2)     Status: None (Preliminary result)   Collection Time: 12/01/18  3:09 PM  Result Value Ref Range   Specimen Description BLOOD RIGHT HAND    Special Requests      BOTTLES DRAWN AEROBIC AND ANAEROBIC Blood Culture results may not be optimal due to an inadequate volume of blood received in culture bottles   Culture      NO GROWTH < 24 HOURS Performed at [facility] [address] [place], [place] [address]    Report Status PENDING   SARS Coronavirus 2 [facility] order, Performed in [place] hospital lab)     Status: None   Collection Time: 12/01/18  5:03 PM  Result Value Ref Range   SARS Coronavirus 2 NEGATIVE NEGATIVE    Comment: (NOTE) If result is NEGATIVE SARS-CoV-2 target nucleic acids are NOT DETECTED. The SARS-CoV-2 RNA is generally detectable in upper and lower  respiratory specimens during the acute phase of infection. The lowest  concentration of SARS-CoV-2 viral copies this assay can detect is 250  copies / mL. A negative result does not preclude SARS-CoV-2 infection  and should not be used as the sole basis for treatment or other  patient management decisions.  A negative result may occur with  improper specimen collection / handling, submission of specimen other  than nasopharyngeal swab, presence of viral mutation(s) within the  areas targeted by this assay, and inadequate number of viral copies  (<250 copies / mL). A negative result must be combined with clinical  observations, patient history, and epidemiological information. If result is POSITIVE SARS-CoV-2 target  nucleic acids are DETECTED. The SARS-CoV-2 RNA is generally detectable in upper and lower  respiratory specimens dur ing the acute phase of infection.  Positive  results are indicative of active infection with SARS-CoV-2.  Clinical  correlation with patient history and other diagnostic information is  necessary to determine patient infection status.  Positive results do  not rule out bacterial infection or co-infection with other viruses. If result is PRESUMPTIVE POSTIVE SARS-CoV-2 nucleic acids MAY BE PRESENT.   A presumptive positive result was obtained on the submitted specimen  and confirmed on repeat testing.  While 2019 novel coronavirus  (SARS-CoV-2) nucleic acids may be present in the submitted sample  additional confirmatory testing may be necessary for epidemiological  and / or clinical management purposes  to differentiate between  SARS-CoV-2 and other Sarbecovirus currently known to infect humans.  If clinically indicated additional testing with an alternate test  methodology [phone]) is advised. The SARS-CoV-2 RNA is generally  detectable in upper and lower respiratory sp ecimens during the acute  phase of infection. The expected result is Negative. Fact Sheet for Patients:  StrictlyIdeas.no Fact Sheet for Healthcare Providers: BankingDealers.co.za This test is not yet approved or cleared by the [place] FDA and has been authorized for detection  and/or diagnosis of SARS-CoV-2 by FDA under an Emergency Use Authorization (EUA).  This EUA will remain in effect (meaning this test can be used) for the duration of the COVID-19 declaration under Section 564(b)(1) of the Act, 21 U.S.C. section 360bbb-3(b)(1), unless the authorization is terminated or revoked sooner. Performed at [facility], [place] [address] [place], [place] [address]   Glucose, capillary     Status: None   Collection Time: 12/01/18  7:06 PM  Result  Value Ref Range   Glucose-Capillary 92 70 - 99 mg/dL  Type and screen [place]     Status: None   Collection Time: 12/01/18  8:30 PM  Result Value Ref Range   ABO/RH(D) A POS    Antibody Screen NEG    Sample Expiration 12/04/2018    Unit Number [id number]    Blood Component Type RED CELLS,LR    Unit division 00    Status of Unit ISSUED,FINAL    Transfusion Status OK TO TRANSFUSE    Crossmatch Result Compatible    Unit Number [id number]    Blood Component Type RED CELLS,LR    Unit division 00    Status of Unit ISSUED,FINAL    Transfusion Status OK TO TRANSFUSE    Crossmatch Result      Compatible Performed at [facility], [place] [address] [place], [place], [place] [address]   I-STAT 4, (NA,K, GLUC, HGB,HCT)     Status: Abnormal   Collection Time: 12/01/18  8:45 PM  Result Value Ref Range   Sodium 136 135 - 145 mmol/L   Potassium 4.3 3.5 - 5.1 mmol/L   Glucose, Bld 142 (H) 70 - 99 mg/dL   HCT 36.0 (L) 39.0 - 52.0 %   Hemoglobin 12.2 (L) 13.0 - 17.0 g/dL  Prepare RBC     Status: None   Collection Time: 12/01/18  8:50 PM  Result Value Ref Range   Order Confirmation      ORDER PROCESSED BY BLOOD BANK Performed at [facility], [place] [address] [place], [place] [address]   Glucose, capillary     Status: Abnormal   Collection Time: 12/01/18  9:26 PM  Result Value Ref Range   Glucose-Capillary 130 (H) 70 - 99 mg/dL  Lactic acid, plasma     Status: None   Collection Time: 12/01/18  9:50 PM  Result Value Ref Range   Lactic Acid, Venous 1.0 0.5 - 1.9 mmol/L    Comment: Performed at [facility] [address], [place], [place] [address]  Hemoglobin and hematocrit, blood     Status: Abnormal   Collection Time: 12/01/18  9:51 PM  Result Value Ref Range   Hemoglobin 8.1 (L) 13.0 - 17.0 g/dL   HCT 25.8 (L) 39.0 - 52.0 %    Comment: Performed at [facility] [address], [place], [place] [address]  Prepare RBC     Status: None    Collection Time: 12/01/18 10:20 PM  Result Value Ref Range   Order Confirmation      ORDER PROCESSED BY BLOOD BANK BB SAMPLE OR UNITS ALREADY AVAILABLE Performed at [facility], [place] [address], [place], [place] [address]   Glucose, capillary     Status: Abnormal   Collection Time: 12/02/18  6:28 AM  Result Value Ref Range   Glucose-Capillary 145 (H) 70 - 99 mg/dL  Basic metabolic panel     Status: Abnormal   Collection Time:  12/02/18  8:13 AM  Result Value Ref Range   Sodium 136 135 - 145 mmol/L   Potassium 4.7 3.5 - 5.1 mmol/L   Chloride 94 (L) 98 - 111 mmol/L   CO2 21 (L) 22 - 32 mmol/L   Glucose, Bld 144 (H) 70 - 99 mg/dL   BUN 58 (H) 8 - 23 mg/dL   Creatinine, Ser 10.18 (H) 0.61 - 1.24 mg/dL   Calcium 8.5 (L) 8.9 - 10.3 mg/dL   GFR calc non Af Amer 5 (L) >60 mL/min   GFR calc Af Amer 5 (L) >60 mL/min   Anion gap 21 (H) 5 - 15    Comment: Performed at [facility] [address], [place], [place] [address]  CBC     Status: Abnormal   Collection Time: 12/02/18  8:13 AM  Result Value Ref Range   WBC 11.2 (H) 4.0 - 10.5 K/uL   RBC 2.90 (L) 4.22 - 5.81 MIL/uL   Hemoglobin 8.7 (L) 13.0 - 17.0 g/dL   HCT 27.6 (L) 39.0 - 52.0 %   MCV 95.2 80.0 - 100.0 fL   MCH 30.0 26.0 - 34.0 pg   MCHC 31.5 30.0 - 36.0 g/dL   RDW 17.6 (H) 11.5 - 15.5 %   Platelets 311 150 - 400 K/uL   nRBC 0.0 0.0 - 0.2 %    Comment: Performed at [facility], [place] [address] [place], [place] [address]  Glucose, capillary     Status: Abnormal   Collection Time: 12/02/18 11:05 AM  Result Value Ref Range   Glucose-Capillary 182 (H) 70 - 99 mg/dL  Hepatitis B surface antigen     Status: None   Collection Time: 12/02/18 12:47 PM  Result Value Ref Range   Hepatitis B Surface Ag Negative Negative    Comment: (NOTE) A courtesy copy of this report has been sent to [facility], [place], [phone] Performed At: [facility] [place], [place] [id number]  Rush Farmer MD [id number]   Glucose, capillary     Status: Abnormal   Collection Time: 12/02/18  5:34 PM  Result Value Ref Range   Glucose-Capillary 159 (H) 70 - 99 mg/dL  Glucose, capillary     Status: Abnormal   Collection Time: 12/02/18  9:55 PM  Result Value Ref Range   Glucose-Capillary 239 (H) 70 - 99 mg/dL  Basic metabolic panel     Status: Abnormal   Collection Time: 12/03/18  3:25 AM  Result Value Ref Range   Sodium 135 135 - 145 mmol/L   Potassium 3.8 3.5 - 5.1 mmol/L    Comment: DELTA CHECK NOTED   Chloride 95 (L) 98 - 111 mmol/L   CO2 25 22 - 32 mmol/L   Glucose, Bld 216 (H) 70 - 99 mg/dL   BUN 28 (H) 8 - 23 mg/dL   Creatinine, Ser 6.26 (H) 0.61 - 1.24 mg/dL    Comment: DELTA CHECK NOTED   Calcium 8.4 (L) 8.9 - 10.3 mg/dL   GFR calc non Af Amer 8 (L) >60 mL/min   GFR calc Af Amer 10 (L) >60 mL/min   Anion gap 15 5 - 15    Comment: Performed at [facility] [address], [place], [place] [address]  CBC     Status: Abnormal   Collection Time: 12/03/18  3:25 AM  Result Value Ref Range   WBC 7.2 4.0 - 10.5 K/uL   RBC 2.62 (L)  4.22 - 5.81 MIL/uL   Hemoglobin 8.0 (L) 13.0 - 17.0 g/dL   HCT 24.4 (L) 39.0 - 52.0 %   MCV 93.1 80.0 - 100.0 fL   MCH 30.5 26.0 - 34.0 pg   MCHC 32.8 30.0 - 36.0 g/dL   RDW 17.1 (H) 11.5 - 15.5 %   Platelets 296 150 - 400 K/uL   nRBC 0.0 0.0 - 0.2 %    Comment: Performed at [facility] [address] [place], [place] [address]  Glucose, capillary     Status: Abnormal   Collection Time: 12/03/18  7:05 AM  Result Value Ref Range   Glucose-Capillary 133 (H) 70 - 99 mg/dL   Dg Chest Port 1 View  Result Date: 12/01/2018 CLINICAL DATA:  Wheezing EXAM: PORTABLE CHEST 1 VIEW COMPARISON:  February 11, 2017. FINDINGS: There is no edema or consolidation. Heart is mildly enlarged with pulmonary vascularity normal. No adenopathy. There is aortic atherosclerosis. No evident bone lesions. IMPRESSION: Mild cardiac enlargement. No  edema or consolidation. Aortic Atherosclerosis (ICD10-I70.0). Electronically Signed   By: Lowella Grip III M.D.   On: 12/01/2018 14:13   Dg Foot Complete Right  Result Date: 12/01/2018 CLINICAL DATA:  68 year old male with a history of foot wound EXAM: RIGHT FOOT COMPLETE - 3+ VIEW COMPARISON:  None. FINDINGS: Soft tissue swelling with subcutaneous gas of the forefoot, predominantly within the plantar soft tissues. No displaced fracture. Degenerative changes of the forefoot and midfoot. Degenerative changes of the hindfoot with enthesopathic changes at the Achilles insertion and plantar fascia insertion. Dense calcifications of the tibial plantar arteries. IMPRESSION: Soft tissue swelling with subcutaneous gas, concerning for necrotizing infection. Atherosclerosis. Degenerative changes of the foot. Electronically Signed   By: Corrie Mckusick D.O.   On: 12/01/2018 14:28   Vas US Carotid  Result Date: 12/02/2018 Carotid Arterial Duplex Study Indications:  Bruit. Risk Factors: Hypertension, Diabetes. Performing Technologist: Maudry Mayhew MHA, RDMS, RVT, RDCS  Examination Guidelines: A complete evaluation includes B-mode imaging, spectral Doppler, color Doppler, and power Doppler as needed of all accessible portions of each vessel. Bilateral testing is considered an integral part of a complete examination. Limited examinations for reoccurring indications may be performed as noted.  Right Carotid Findings: +----------+--------+-------+--------+--------------------------------+--------+           PSV cm/sEDV    StenosisDescribe                        Comments                   cm/s                                                    +----------+--------+-------+--------+--------------------------------+--------+ CCA Prox  131     21                                                      +----------+--------+-------+--------+--------------------------------+--------+ CCA Distal87       19             smooth and heterogenous                  +----------+--------+-------+--------+--------------------------------+--------+  ICA Prox  136     30             smooth, heterogenous and                                                  calcific                                 +----------+--------+-------+--------+--------------------------------+--------+ ICA Distal110     21                                                      +----------+--------+-------+--------+--------------------------------+--------+ ECA       143                                                             +----------+--------+-------+--------+--------------------------------+--------+ +----------+--------+-------+----------------+-------------------+           PSV cm/sEDV cmsDescribe        Arm Pressure (mmHG) +----------+--------+-------+----------------+-------------------+ [id number]            Multiphasic, WNL                    +----------+--------+-------+----------------+-------------------+ +---------+--------+--+--------+--+---------+ VertebralPSV cm/s86EDV cm/s27Antegrade +---------+--------+--+--------+--+---------+  Left Carotid Findings: +----------+--------+-------+--------+--------------------------------+--------+           PSV cm/sEDV    StenosisDescribe                        Comments                   cm/s                                                    +----------+--------+-------+--------+--------------------------------+--------+ CCA Prox  65      15                                                      +----------+--------+-------+--------+--------------------------------+--------+ CCA Distal86      25             smooth, heterogenous and                                                  calcific                                 +----------+--------+-------+--------+--------------------------------+--------+ ICA Prox   308     47  calcific and irregular                   +----------+--------+-------+--------+--------------------------------+--------+ ICA Distal116     33                                                      +----------+--------+-------+--------+--------------------------------+--------+ ECA       201     8              smooth and heterogenous                  +----------+--------+-------+--------+--------------------------------+--------+ +----------+--------+--------+----------+-------------------+ SubclavianPSV cm/sEDV cm/sDescribe  Arm Pressure (mmHG) +----------+--------+--------+----------+-------------------+           196             Monophasic                    +----------+--------+--------+----------+-------------------+ +---------+--------+--+--------+--+---------+ VertebralPSV cm/s55EDV cm/s16Antegrade +---------+--------+--+--------+--+---------+  Summary: Right Carotid: Velocities in the right ICA are consistent with a 1-39% stenosis. Left Carotid: Velocities in the left ICA are consistent with a 40-59% stenosis. Vertebrals:  Bilateral vertebral arteries demonstrate antegrade flow. Subclavians: Left subclavian artery was monophasic, suggestive of possible              proximal obstruction. Normal flow hemodynamics were seen in the              right subclavian artery. *See table(s) above for measurements and observations.  Electronically signed by Deitra Mayo MD on 12/02/2018 at 10:55:29 AM.    Final        Medical Problem List and Plan: 1.   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  Antithrombotics: -DVT/anticoagulation:  Subcutaneous heparin  -antiplatelet therapy:  Plavix 75 mg daily 3. 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 Mood:   trazodone 50 mg daily, melatonin 9 mg daily at bedtime  -antipsychotic agents: Risperdal 0.25 mg daily 5. Neuropsych: This patient is capable of making decisions on his own behalf. 6. Skin/Wound Care:  Routine skin checks 7. Fluids/Electrolytes/Nutrition:  Routine I/Os.   8. Acute on chronic anemia. Follow-up CBC with HD. 9. End-stage renal disease. Continue hemodialysis 10. Diabetes mellitus and peripheral neuropathy.Lantus insulin 85 units daily at bedtime.diabetic teaching.  Monitor with increased mobility. 11. Hypertension.  Monitor with increased mobility. 12. COPD with remote tobacco abuse. Continue nebulizer as needed 13. Hyperlipidemia. Lipitor 14. Hypothyroidism. Synthroid 15. GERD. Protonix 16. Constipation. Laxative assistance  [name], PA-C 12/03/2018

## 2018-12-03 NOTE — PMR Pre-admission (Signed)
PMR Admission Coordinator Pre-Admission Assessment  Patient: [name] is an 68 y.o., male MRN: [id number] DOB: 1950-12-13 Height: _0  (193 cm) Weight: 118.7 kg  Insurance Information HMO:     PPO:      PCP:      IPA:      80/20:      OTHER:  PRIMARY: Medicare a and b      Policy#: [id number]      Subscriber: pt Benefits:  Phone #: passport one online     Name: 12/03/2018 Eff. Date: 08/13/2012     Deduct: $1408      Out of Pocket Max: none      Life Max: none CIR: 100%      SNF: 20 full days Outpatient: 80%     Co-Pay: 20% Home Health: 100%      Co-Pay: none DME: 80%     Co-Pay: 20% Providers: pt choice  SECONDARY: BCBS supplement      Policy#: [id number]        Medicaid Application Date:       Case Manager:  Disability Application Date:       Case Worker:   The "Data Collection Information Summary" for patients in Inpatient Rehabilitation Facilities with attached "Privacy Act [place] Records" was provided and verbally reviewed with: Patient  Emergency Contact Information Contact Information    Name Relation Home Work Mobile   [place] Spouse [phone]  [phone]   [place] Daughter [phone]  [phone]      Current Medical History  Patient Admitting Diagnosis: right BKA  History of Present Illness:  [name] is a 68 year old right-handed male history of COPD who quit smoking 6 years ago, end-stage renal disease with hemodialysis Tuesday Thursday and Saturday at [facility] in [facility], diabetes mellitus peripheral neuropathy, hypertension and peripheral vascular disease maintained on Plavix. Presented 12/01/2018 with ischemic changes right foot. Per report patient turned his right great toe proximal 1 week ago developed a black line on the bottom of the toe. X-rays and imaging subcutaneous gas concerning for necrotizing infection. Limb was not felt to be salvageable. 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  [name].Hospital course pain management. Hemodialysis ongoing as per renal services. A carotid Dopplers done for evaluation of bruit showed left ICA stenosis 40-59% and monitored. Acute on chronic anemia hemoglobin 8.0. Subcutaneous heparin for DVT prophylaxis.   Patient's medical record from [facility]  has been reviewed by the rehabilitation admission coordinator and physician.  Past Medical History  Past Medical History:  Diagnosis Date  . Anemia   . Anxiety   . Arthritis   . Chronic kidney disease   . COPD (chronic obstructive pulmonary disease) ([place])    Pt denies  . Depression   . Diabetes mellitus without complication ([place])   . GERD (gastroesophageal reflux disease)   . H/O hiatal hernia   . Headache(784.0)    Hx: Migraines  . Hypertension   . Neuropathy   . Numbness of toes    toes and feet  . Pneumonia    Hx: of several times  . Renal insufficiency   . Shortness of breath dyspnea   . Type 2 diabetes mellitus (H[facility])     Family History   family history includes Heart disease in his father.  Prior Rehab/Hospitalizations Has the patient had prior rehab or hospitalizations prior to admission? Yes  Has the patient had major surgery during 100 days prior to admission? No   Current  Medications  Current Facility-Administered Medications:  .  acetaminophen (TYLENOL) tablet 325-650 mg, 325-650 mg, Oral, Q4H PRN **OR** acetaminophen (TYLENOL) suppository 325-650 mg, 325-650 mg, Rectal, Q4H PRN, Rhyne, Samantha J, PA-C .  albuterol (PROVENTIL) (2.5 MG/3ML) 0.083% nebulizer solution 3 mL, 3 mL, Inhalation, Q6H PRN, Rhyne, Samantha J, PA-C .  atorvastatin (LIPITOR) tablet 80 mg, 80 mg, Oral, QHS, Rhyne, Samantha J, PA-C, 80 mg at 12/02/18 2219 .  bisacodyl (DULCOLAX) suppository 10 mg, 10 mg, Rectal, Daily PRN, Rhyne, Samantha J, PA-C .  calcium acetate (PHOSLO) capsule 1,334 mg, 1,334 mg, Oral, PRN, Rhyne, Samantha J, PA-C, 1,334 mg at 12/03/18 1208 .  calcium acetate  (PHOSLO) capsule 2,668 mg, 2,668 mg, Oral, TID WC, [name], MD, 2,668 mg at 12/02/18 1743 .  Chlorhexidine Gluconate Cloth 2 % PADS 6 each, 6 each, Topical, Q0600, Collins, Samantha G, PA-C .  cinacalcet (SENSIPAR) tablet 30 mg, 30 mg, Oral, QPM, Rhyne, Samantha J, PA-C, 30 mg at 12/02/18 1743 .  clopidogrel (PLAVIX) tablet 75 mg, 75 mg, Oral, Daily, Rhyne, Samantha J, PA-C, 75 mg at 12/03/18 0848 .  docusate sodium (COLACE) capsule 100 mg, 100 mg, Oral, Daily, Rhyne, Samantha J, PA-C .  doxercalciferol (HECTOROL) injection 0.5 mcg, 0.5 mcg, Intravenous, Q T,Th,Sa-HD, Collins, Samantha G, PA-C, 0.5 mcg at 12/02/18 1414 .  guaiFENesin-dextromethorphan (ROBITUSSIN DM) 100-10 MG/5ML syrup 15 mL, 15 mL, Oral, Q4H PRN, Rhyne, Samantha J, PA-C .  heparin injection 5,000 Units, 5,000 Units, Subcutaneous, Q8H, Rhyne, Samantha J, PA-C, 5,000 Units at 12/03/18 0704 .  hydrALAZINE (APRESOLINE) injection 5 mg, 5 mg, Intravenous, Q20 Min PRN, Rhyne, Samantha J, PA-C .  HYDROmorphone (DILAUDID) injection 0.5 mg, 0.5 mg, Intravenous, Q2H PRN, Rhyne, Samantha J, PA-C, 0.5 mg at 12/02/18 1413 .  insulin aspart (novoLOG) injection 0-15 Units, 0-15 Units, Subcutaneous, TID WC, Rhyne, Samantha J, PA-C, 2 Units at 12/03/18 1205 .  insulin glargine (LANTUS) injection 85 Units, 85 Units, Subcutaneous, QHS, Rhyne, Hulen Shouts, PA-C, 85 Units at 12/02/18 2219 .  labetalol (NORMODYNE) injection 10 mg, 10 mg, Intravenous, Q10 min PRN, Rhyne, Samantha J, PA-C .  levothyroxine (SYNTHROID) tablet 125 mcg, 125 mcg, Oral, Q0600, Rhyne, Hulen Shouts, PA-C, 125 mcg at 12/03/18 0703 .  magnesium sulfate IVPB 2 g 50 mL, 2 g, Intravenous, Daily PRN, Rhyne, Samantha J, PA-C .  Melatonin TABS 9 mg, 9 mg, Oral, QHS, Rhyne, Samantha J, PA-C, 9 mg at 12/02/18 2218 .  metoprolol tartrate (LOPRESSOR) injection 2-5 mg, 2-5 mg, Intravenous, Q2H PRN, Rhyne, Samantha J, PA-C .  ondansetron (ZOFRAN) injection 4 mg, 4 mg, Intravenous, Q6H  PRN, Rhyne, Samantha J, PA-C .  oxyCODONE-acetaminophen (PERCOCET/ROXICET) 5-325 MG per tablet 1-2 tablet, 1-2 tablet, Oral, Q4H PRN, Rhyne, Samantha J, PA-C, 2 tablet at 12/03/18 1159 .  pantoprazole (PROTONIX) EC tablet 40 mg, 40 mg, Oral, Daily, Rhyne, Samantha J, PA-C, 40 mg at 12/03/18 0847 .  phenol (CHLORASEPTIC) mouth spray 1 spray, 1 spray, Mouth/Throat, PRN, Rhyne, Samantha J, PA-C .  polyethylene glycol (MIRALAX / GLYCOLAX) packet 17 g, 17 g, Oral, Daily PRN, Rhyne, Samantha J, PA-C .  pregabalin (LYRICA) capsule 150 mg, 150 mg, Oral, TID, Rhyne, Samantha J, PA-C, 150 mg at 12/03/18 0848 .  risperiDONE (RISPERDAL) tablet 0.25 mg, 0.25 mg, Oral, Daily, Rhyne, Samantha J, PA-C, 0.25 mg at 12/03/18 0848 .  traZODone (DESYREL) tablet 50 mg, 50 mg, Oral, Daily, Rhyne, Samantha J, PA-C, 50 mg at 12/03/18 0847  Patients Current Diet:  Diet Order            Diet renal with fluid restriction Fluid restriction: 1200 mL Fluid; Room service appropriate? Yes; Fluid consistency: Thin  Diet effective now              Precautions / Restrictions Precautions Precautions: Fall Restrictions Weight Bearing Restrictions: Yes RLE Weight Bearing: Non weight bearing   Has the patient had 2 or more falls or a fall with injury in the past year? No  Prior Activity Level Community (5-7x/wk): Independent without AD; drove  Prior Functional Level Self Care: Did the patient need help bathing, dressing, using the toilet or eating? Independent  Indoor Mobility: Did the patient need assistance with walking from room to room (with or without device)? Independent  Stairs: Did the patient need assistance with internal or external stairs (with or without device)? Independent  Functional Cognition: Did the patient need help planning regular tasks such as shopping or remembering to take medications? Independent  Home Assistive Devices / Equipment Home Assistive Devices/Equipment: Shower chair with back,  Environmental consultant (specify type) Home Equipment: Tub bench, Grab bars - toilet, Grab bars - tub/shower, Hand held shower head, Walker - 2 wheels, Wheelchair - manual, Bedside commode  Prior Device Use: Indicate devices/aids used by the patient prior to current illness, exacerbation or injury? None of the above  Current Functional Level Cognition  Overall Cognitive Status: Within Functional Limits for tasks assessed Orientation Level: Oriented X4    Extremity Assessment (includes Sensation/Coordination)  Upper Extremity Assessment: Overall WFL for tasks assessed  Lower Extremity Assessment: Defer to PT evaluation RLE Deficits / Details: s/p BKA hip and knee ROM limited by pain  RLE: Unable to fully assess due to pain LLE Deficits / Details: nonhealing wound on plantar heel of L foot, hip, knee and ankle ROM WFL strength grossly 3+/5 due to longstanding decreased weightshift to L to protect wound LLE Sensation: decreased light touch    ADLs  Overall ADL's : Needs assistance/impaired Eating/Feeding: Independent Grooming: Supervision/safety, Sitting Upper Body Bathing: Supervision/ safety, Sitting Lower Body Bathing: Moderate assistance, Sitting/lateral leans Upper Body Dressing : Supervision/safety, Sitting Lower Body Dressing: Maximal assistance, Sitting/lateral leans    Mobility  Overal bed mobility: Needs Assistance Bed Mobility: Supine to Sit, Sit to Supine Supine to sit: Min assist Sit to supine: Min assist General bed mobility comments: minA for pt to pullup against therapist to bring hips to EoB, mina for management of LE into bed    Transfers  Overall transfer level: Needs assistance Equipment used: Rolling walker (2 wheeled) Transfers: Sit to/from Stand Sit to Stand: Mod assist, From elevated surface, +2 physical assistance General transfer comment: Pt unable to power up from low bed surface. With elevated bed surface, he was able to clear hips but not succesful with power up on  first attempt. On second attempt from elevated surface, he was able to power up with cues to activate glutes. Once in standing, he requires verbal and tactile cues for anterior pelvic tilt and upright posture.    Ambulation / Gait / Stairs / Wheelchair Mobility  Ambulation/Gait General Gait Details: unable to attempt today    Posture / Balance Balance Overall balance assessment: Needs assistance Sitting-balance support: Feet supported, Single extremity supported Sitting balance-Leahy Scale: Poor Standing balance support: Bilateral upper extremity supported Standing balance-Leahy Scale: Zero Standing balance comment: requires outside assist to maintain upright    Special needs/care consideration BiPAP/CPAP  N/a CPM  N/a Continuous Drip IV  N/a Dialysis Hemodialysis T Th Sat [facility]  Life Vest  N/a Oxygen  N/a Special Bed  N/a Trach Size  N/a Wound Vac n/a Skin Left upper arm fistula; surgical incision with compression dressing abrasion left leg Bowel mgmt: continent LBM 4/20 Bladder mgmt: no urine Diabetic mgmt: yes Behavioral consideration n/a Chemo/radiation  N/a   Previous Home Environment  Living Arrangements: Spouse/significant other  Lives With: Spouse Available Help at Discharge: Family, Available 24 hours/day Type of Home: House Home Layout: One level Home Access: Ramped entrance Bathroom Shower/Tub: Tub/shower unit, Architectural technologist: Standard Bathroom Accessibility: Yes Home Care Services: No  Discharge Living Setting Plans for Discharge Living Setting: Patient's home, Lives with (comment)(spouse) Type of Home at Discharge: House Discharge Home Layout: One level Discharge Home Access: Weiser entrance Discharge Bathroom Shower/Tub: Walk-in shower, Curtain Discharge Bathroom Toilet: Standard Discharge Bathroom Accessibility: Yes How Accessible: Accessible via walker Does the patient have any problems obtaining your medications?: No  Social/Family/Support  Systems Patient Roles: Spouse Contact Information: wife, [name] Anticipated Caregiver: wife Anticipated Caregiver's Contact Information: cell [phone] Ability/Limitations of Caregiver: no limitations Caregiver Availability: 24/7 Discharge Plan Discussed with Primary Caregiver: Yes Is Caregiver In Agreement with Plan?: Yes Does Caregiver/Family have Issues with Lodging/Transportation while Pt is in Rehab?: No  Goals/Additional Needs Patient/Family Goal for Rehab: Mod I to supervision with PT and OT Expected length of stay: ELOS 7 to 10 days Special Service Needs: Hemodialyiss T TH Sat [place] ; drove self Pt/Family Agrees to Admission and willing to participate: Yes Program Orientation Provided & Reviewed with Pt/Caregiver Including Roles  & Responsibilities: Yes  Decrease burden of Care through IP rehab admission: n/a  Possible need for SNF placement upon discharge:  Not anticipated  Patient Condition: I have reviewed medical records from [facility], spoken with CM, and patient. I met with patient at the bedside for inpatient rehabilitation assessment.  Patient will benefit from ongoing PT and OT, can actively participate in 3 hours of therapy a day 5 days of the week, and can make measurable gains during the admission.  Patient will also benefit from the coordinated team approach during an Inpatient Acute Rehabilitation admission.  The patient will receive intensive therapy as well as Rehabilitation physician, nursing, social worker, and care management interventions.  Due to safety, skin/wound care, disease management, medication administration, pain management and patient education the patient requires 24 hour a day rehabilitation nursing.  The patient is currently mod assist with mobility and basic ADLs.  Discharge setting and therapy post discharge at home with home health is anticipated.  Patient has agreed to participate in the Acute Inpatient Rehabilitation Program and will  admit today.  Preadmission Screen Completed By:  Annamary Rummage MSN 12/03/2018 12:41 PM ______________________________________________________________________   Discussed status with Dr. Posey Pronto  on  12/03/2018 at  49 and received approval for admission today.  Admission Coordinator:  Cleatrice Burke, RN MSN time  1240 Date  12/03/2018   Assessment/Plan: Diagnosis: right BKA  1. Does the need for close, 24 hr/day Medical supervision in concert with the patient's rehab needs make it unreasonable for this patient to be served in a less intensive setting? Yes  2. Co-Morbidities requiring supervision/potential complications: COPD who quit smoking 6 years ago, end-stage renal disease with hemodialysis Tuesday Thursday and Saturday at [facility] in [place], diabetes mellitus peripheral neuropathy, hypertension and peripheral vascular disease maintained on Plavix 3. Due to safety, skin/wound care, disease management, pain management and patient education,  does the patient require 24 hr/day rehab nursing? Yes 4. Does the patient require coordinated care of a physician, rehab nurse, PT (1-2 hrs/day, 5 days/week) and OT (1-2 hrs/day, 5 days/week) to address physical and functional deficits in the context of the above medical diagnosis(es)? Yes Addressing deficits in the following areas: balance, endurance, locomotion, strength, transferring, bathing, dressing, toileting and psychosocial support 5. Can the patient actively participate in an intensive therapy program of at least 3 hrs of therapy 5 days a week? Yes 6. The potential for patient to make measurable gains while on inpatient rehab is excellent 7. Anticipated functional outcomes upon discharge from inpatients are: supervision PT, supervision and min assist OT, n/a SLP 8. Estimated rehab length of stay to reach the above functional goals is: 9-13 days. 9. Anticipated D/C setting: Home 10. Anticipated post D/C treatments: HH  therapy and Home excercise program 11. Overall Rehab/Functional Prognosis: good  MD Signature: Delice Lesch, MD, ABPMR

## 2018-12-03 NOTE — Progress Notes (Signed)
Clayton Arn, MD  Physician  Physical Medicine and Rehabilitation  PMR Pre-admission  Signed  Date of Service:  12/03/2018 12:33 PM       Related encounter: ED to Hosp-Admission (Current) from 12/01/2018 in [facility] 4E CV SURGICAL PROGRESSIVE CARE      Signed         Show:Clear all _0 Manual_1 Template_2 Copied  Added by: _3 Cristina Gong, RN_4 Clayton Arn, MD  _5 Hover for details PMR Admission Coordinator Pre-Admission Assessment  Patient: [name] is an 68 y.o., male MRN: [id number] DOB: 1951-07-28 Height: _6  (193 cm) Weight: 118.7 kg  Insurance Information HMO:     PPO:      PCP:      IPA:      80/20:      OTHER:  PRIMARY: Medicare a and b      Policy#: [id number]      Subscriber: pt Benefits:  Phone #: passport one online     Name: 12/03/2018 Eff. Date: 08/13/2012     Deduct: $1408      Out of Pocket Max: none      Life Max: none CIR: 100%      SNF: 20 full days Outpatient: 80%     Co-Pay: 20% Home Health: 100%      Co-Pay: none DME: 80%     Co-Pay: 20% Providers: pt choice  SECONDARY: BCBS supplement      Policy#: [id number]        Medicaid Application Date:       Case Manager:  Disability Application Date:       Case Worker:   The "Data Collection Information Summary" for patients in Inpatient Rehabilitation Facilities with attached "Privacy Act [place] Records" was provided and verbally reviewed with: Patient  Emergency Contact Information         Contact Information    Name Relation Home Work Mobile   [place] Spouse [phone]  [phone]   [place] Daughter [phone]  [phone]      Current Medical History  Patient Admitting Diagnosis: right BKA  History of Present Illness: [name] is a 68 year old right-handed male history of COPD who quit smoking 6 years ago, end-stage renal disease with hemodialysis Tuesday Thursday and Saturday at [facility],  diabetes mellitus peripheral neuropathy, hypertension and peripheral vascular disease maintained on Plavix. Presented 12/01/2018 with ischemic changes right foot. Per report patient turned his right great toe proximal 1 week ago developed a black line on the bottom of the toe. X-rays and imaging subcutaneous gas concerning for necrotizing infection. Limb was not felt to be salvageable. 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 [name].Hospital course pain management. Hemodialysis ongoing as per renal services. A carotid Dopplers done for evaluation of bruit showed left ICA stenosis 40-59% and monitored. Acute on chronic anemia hemoglobin 8.0. Subcutaneous heparin for DVT prophylaxis.   Patient's medical record from [facility]  has been reviewed by the rehabilitation admission coordinator and physician.  Past Medical History      Past Medical History:  Diagnosis Date  . Anemia   . Anxiety   . Arthritis   . Chronic kidney disease   . COPD (chronic obstructive pulmonary disease) ([place])    Pt denies  . Depression   . Diabetes mellitus without complication ([place])   . GERD (gastroesophageal reflux disease)   . H/O hiatal hernia   . Headache(784.0)    Hx: Migraines  . Hypertension   . Neuropathy   .  Numbness of toes    toes and feet  . Pneumonia    Hx: of several times  . Renal insufficiency   . Shortness of breath dyspnea   . Type 2 diabetes mellitus (HCC)     Family History   family history includes Heart disease in his father.  Prior Rehab/Hospitalizations Has the patient had prior rehab or hospitalizations prior to admission? Yes  Has the patient had major surgery during 100 days prior to admission? No              Current Medications  Current Facility-Administered Medications:  .  acetaminophen (TYLENOL) tablet 325-650 mg, 325-650 mg, Oral, Q4H PRN **OR** acetaminophen (TYLENOL) suppository 325-650 mg, 325-650 mg, Rectal, Q4H PRN,  Rhyne, Samantha J, PA-C .  albuterol (PROVENTIL) (2.5 MG/3ML) 0.083% nebulizer solution 3 mL, 3 mL, Inhalation, Q6H PRN, Rhyne, Samantha J, PA-C .  atorvastatin (LIPITOR) tablet 80 mg, 80 mg, Oral, QHS, Rhyne, Samantha J, PA-C, 80 mg at 12/02/18 2219 .  bisacodyl (DULCOLAX) suppository 10 mg, 10 mg, Rectal, Daily PRN, Rhyne, Samantha J, PA-C .  calcium acetate (PHOSLO) capsule 1,334 mg, 1,334 mg, Oral, PRN, Rhyne, Samantha J, PA-C, 1,334 mg at 12/03/18 1208 .  calcium acetate (PHOSLO) capsule 2,668 mg, 2,668 mg, Oral, TID WC, [name], MD, 2,668 mg at 12/02/18 1743 .  Chlorhexidine Gluconate Cloth 2 % PADS 6 each, 6 each, Topical, Q0600, Collins, Samantha G, PA-C .  cinacalcet (SENSIPAR) tablet 30 mg, 30 mg, Oral, QPM, Rhyne, Samantha J, PA-C, 30 mg at 12/02/18 1743 .  clopidogrel (PLAVIX) tablet 75 mg, 75 mg, Oral, Daily, Rhyne, Samantha J, PA-C, 75 mg at 12/03/18 0848 .  docusate sodium (COLACE) capsule 100 mg, 100 mg, Oral, Daily, Rhyne, Samantha J, PA-C .  doxercalciferol (HECTOROL) injection 0.5 mcg, 0.5 mcg, Intravenous, Q T,Th,Sa-HD, Collins, Samantha G, PA-C, 0.5 mcg at 12/02/18 1414 .  guaiFENesin-dextromethorphan (ROBITUSSIN DM) 100-10 MG/5ML syrup 15 mL, 15 mL, Oral, Q4H PRN, Rhyne, Samantha J, PA-C .  heparin injection 5,000 Units, 5,000 Units, Subcutaneous, Q8H, Rhyne, Samantha J, PA-C, 5,000 Units at 12/03/18 0704 .  hydrALAZINE (APRESOLINE) injection 5 mg, 5 mg, Intravenous, Q20 Min PRN, Rhyne, Samantha J, PA-C .  HYDROmorphone (DILAUDID) injection 0.5 mg, 0.5 mg, Intravenous, Q2H PRN, Rhyne, Samantha J, PA-C, 0.5 mg at 12/02/18 1413 .  insulin aspart (novoLOG) injection 0-15 Units, 0-15 Units, Subcutaneous, TID WC, Rhyne, Samantha J, PA-C, 2 Units at 12/03/18 1205 .  insulin glargine (LANTUS) injection 85 Units, 85 Units, Subcutaneous, QHS, Rhyne, Hulen Shouts, PA-C, 85 Units at 12/02/18 2219 .  labetalol (NORMODYNE) injection 10 mg, 10 mg, Intravenous, Q10 min PRN, Rhyne,  Samantha J, PA-C .  levothyroxine (SYNTHROID) tablet 125 mcg, 125 mcg, Oral, Q0600, Rhyne, Hulen Shouts, PA-C, 125 mcg at 12/03/18 0703 .  magnesium sulfate IVPB 2 g 50 mL, 2 g, Intravenous, Daily PRN, Rhyne, Samantha J, PA-C .  Melatonin TABS 9 mg, 9 mg, Oral, QHS, Rhyne, Samantha J, PA-C, 9 mg at 12/02/18 2218 .  metoprolol tartrate (LOPRESSOR) injection 2-5 mg, 2-5 mg, Intravenous, Q2H PRN, Rhyne, Samantha J, PA-C .  ondansetron (ZOFRAN) injection 4 mg, 4 mg, Intravenous, Q6H PRN, Rhyne, Samantha J, PA-C .  oxyCODONE-acetaminophen (PERCOCET/ROXICET) 5-325 MG per tablet 1-2 tablet, 1-2 tablet, Oral, Q4H PRN, Rhyne, Samantha J, PA-C, 2 tablet at 12/03/18 1159 .  pantoprazole (PROTONIX) EC tablet 40 mg, 40 mg, Oral, Daily, Rhyne, Samantha J, PA-C, 40 mg at 12/03/18 0847 .  phenol (  CHLORASEPTIC) mouth spray 1 spray, 1 spray, Mouth/Throat, PRN, Rhyne, Samantha J, PA-C .  polyethylene glycol (MIRALAX / GLYCOLAX) packet 17 g, 17 g, Oral, Daily PRN, Rhyne, Samantha J, PA-C .  pregabalin (LYRICA) capsule 150 mg, 150 mg, Oral, TID, Rhyne, Samantha J, PA-C, 150 mg at 12/03/18 0848 .  risperiDONE (RISPERDAL) tablet 0.25 mg, 0.25 mg, Oral, Daily, Rhyne, Samantha J, PA-C, 0.25 mg at 12/03/18 0848 .  traZODone (DESYREL) tablet 50 mg, 50 mg, Oral, Daily, Rhyne, Samantha J, PA-C, 50 mg at 12/03/18 0847  Patients Current Diet:  Diet Order                  Diet renal with fluid restriction Fluid restriction: 1200 mL Fluid; Room service appropriate? Yes; Fluid consistency: Thin  Diet effective now               Precautions / Restrictions Precautions Precautions: Fall Restrictions Weight Bearing Restrictions: Yes RLE Weight Bearing: Non weight bearing   Has the patient had 2 or more falls or a fall with injury in the past year? No  Prior Activity Level Community (5-7x/wk): Independent without AD; drove  Prior Functional Level Self Care: Did the patient need help bathing, dressing, using  the toilet or eating? Independent  Indoor Mobility: Did the patient need assistance with walking from room to room (with or without device)? Independent  Stairs: Did the patient need assistance with internal or external stairs (with or without device)? Independent  Functional Cognition: Did the patient need help planning regular tasks such as shopping or remembering to take medications? Independent  Home Assistive Devices / Equipment Home Assistive Devices/Equipment: Shower chair with back, Environmental consultant (specify type) Home Equipment: Tub bench, Grab bars - toilet, Grab bars - tub/shower, Hand held shower head, Walker - 2 wheels, Wheelchair - manual, Bedside commode  Prior Device Use: Indicate devices/aids used by the patient prior to current illness, exacerbation or injury? None of the above  Current Functional Level Cognition  Overall Cognitive Status: Within Functional Limits for tasks assessed Orientation Level: Oriented X4    Extremity Assessment (includes Sensation/Coordination)  Upper Extremity Assessment: Overall WFL for tasks assessed  Lower Extremity Assessment: Defer to PT evaluation RLE Deficits / Details: s/p BKA hip and knee ROM limited by pain  RLE: Unable to fully assess due to pain LLE Deficits / Details: nonhealing wound on plantar heel of L foot, hip, knee and ankle ROM WFL strength grossly 3+/5 due to longstanding decreased weightshift to L to protect wound LLE Sensation: decreased light touch    ADLs  Overall ADL's : Needs assistance/impaired Eating/Feeding: Independent Grooming: Supervision/safety, Sitting Upper Body Bathing: Supervision/ safety, Sitting Lower Body Bathing: Moderate assistance, Sitting/lateral leans Upper Body Dressing : Supervision/safety, Sitting Lower Body Dressing: Maximal assistance, Sitting/lateral leans    Mobility  Overal bed mobility: Needs Assistance Bed Mobility: Supine to Sit, Sit to Supine Supine to sit: Min assist Sit  to supine: Min assist General bed mobility comments: minA for pt to pullup against therapist to bring hips to EoB, mina for management of LE into bed    Transfers  Overall transfer level: Needs assistance Equipment used: Rolling walker (2 wheeled) Transfers: Sit to/from Stand Sit to Stand: Mod assist, From elevated surface, +2 physical assistance General transfer comment: Pt unable to power up from low bed surface. With elevated bed surface, he was able to clear hips but not succesful with power up on first attempt. On second attempt from elevated surface, he was  able to power up with cues to activate glutes. Once in standing, he requires verbal and tactile cues for anterior pelvic tilt and upright posture.    Ambulation / Gait / Stairs / Wheelchair Mobility  Ambulation/Gait General Gait Details: unable to attempt today    Posture / Balance Balance Overall balance assessment: Needs assistance Sitting-balance support: Feet supported, Single extremity supported Sitting balance-Leahy Scale: Poor Standing balance support: Bilateral upper extremity supported Standing balance-Leahy Scale: Zero Standing balance comment: requires outside assist to maintain upright    Special needs/care consideration BiPAP/CPAP  N/a CPM  N/a Continuous Drip IV  N/a Dialysis Hemodialysis T Th Sat [facility]  Life Vest  N/a Oxygen  N/a Special Bed  N/a Trach Size  N/a Wound Vac n/a Skin Left upper arm fistula; surgical incision with compression dressing abrasion left leg Bowel mgmt: continent LBM 4/20 Bladder mgmt: no urine Diabetic mgmt: yes Behavioral consideration n/a Chemo/radiation  N/a   Previous Home Environment  Living Arrangements: Spouse/significant other  Lives With: Spouse Available Help at Discharge: Family, Available 24 hours/day Type of Home: House Home Layout: One level Home Access: Ramped entrance Bathroom Shower/Tub: Tub/shower unit, Architectural technologist: Standard Bathroom  Accessibility: Yes Home Care Services: No  Discharge Living Setting Plans for Discharge Living Setting: Patient's home, Lives with (comment)(spouse) Type of Home at Discharge: House Discharge Home Layout: One level Discharge Home Access: [place] entrance Discharge Bathroom Shower/Tub: Walk-in shower, Curtain Discharge Bathroom Toilet: Standard Discharge Bathroom Accessibility: Yes How Accessible: Accessible via walker Does the patient have any problems obtaining your medications?: No  Social/Family/Support Systems Patient Roles: Spouse Contact Information: wife, [name] Anticipated Caregiver: wife Anticipated Caregiver's Contact Information: cell [phone] Ability/Limitations of Caregiver: no limitations Caregiver Availability: 24/7 Discharge Plan Discussed with Primary Caregiver: Yes Is Caregiver In Agreement with Plan?: Yes Does Caregiver/Family have Issues with Lodging/Transportation while Pt is in Rehab?: No  Goals/Additional Needs Patient/Family Goal for Rehab: Mod I to supervision with PT and OT Expected length of stay: ELOS 7 to 10 days Special Service Needs: Hemodialyiss T TH Sat [place] ; drove self Pt/Family Agrees to Admission and willing to participate: Yes Program Orientation Provided & Reviewed with Pt/Caregiver Including Roles  & Responsibilities: Yes  Decrease burden of Care through IP rehab admission: n/a  Possible need for SNF placement upon discharge:  Not anticipated  Patient Condition: I have reviewed medical records from [facility], spoken with CM, and patient. I met with patient at the bedside for inpatient rehabilitation assessment.  Patient will benefit from ongoing PT and OT, can actively participate in 3 hours of therapy a day 5 days of the week, and can make measurable gains during the admission.  Patient will also benefit from the coordinated team approach during an Inpatient Acute Rehabilitation admission.  The patient will receive  intensive therapy as well as Rehabilitation physician, nursing, social worker, and care management interventions.  Due to safety, skin/wound care, disease management, medication administration, pain management and patient education the patient requires 24 hour a day rehabilitation nursing.  The patient is currently mod assist with mobility and basic ADLs.  Discharge setting and therapy post discharge at home with home health is anticipated.  Patient has agreed to participate in the Acute Inpatient Rehabilitation Program and will admit today.  Preadmission Screen Completed By:  Annamary Rummage MSN 12/03/2018 12:41 PM ______________________________________________________________________   Discussed status with Dr. Posey Pronto  on  12/03/2018 at  21 and received approval for admission today.  Admission Coordinator:  Cleatrice Burke, RN MSN time  1240 Date  12/03/2018   Assessment/Plan: Diagnosis: right BKA  1. Does the need for close, 24 hr/day Medical supervision in concert with the patient's rehab needs make it unreasonable for this patient to be served in a less intensive setting? Yes  2. Co-Morbidities requiring supervision/potential complications: COPD who quit smoking 6 years ago, end-stage renal disease with hemodialysis Tuesday Thursday and Saturday at [facility], diabetes mellitus peripheral neuropathy, hypertension and peripheral vascular disease maintained on Plavix 3. Due to safety, skin/wound care, disease management, pain management and patient education, does the patient require 24 hr/day rehab nursing? Yes 4. Does the patient require coordinated care of a physician, rehab nurse, PT (1-2 hrs/day, 5 days/week) and OT (1-2 hrs/day, 5 days/week) to address physical and functional deficits in the context of the above medical diagnosis(es)? Yes Addressing deficits in the following areas: balance, endurance, locomotion, strength, transferring, bathing,  dressing, toileting and psychosocial support 5. Can the patient actively participate in an intensive therapy program of at least 3 hrs of therapy 5 days a week? Yes 6. The potential for patient to make measurable gains while on inpatient rehab is excellent 7. Anticipated functional outcomes upon discharge from inpatients are: supervision PT, supervision and min assist OT, n/a SLP 8. Estimated rehab length of stay to reach the above functional goals is: 9-13 days. 9. Anticipated D/C setting: Home 10. Anticipated post D/C treatments: HH therapy and Home excercise program 11. Overall Rehab/Functional Prognosis: good  MD Signature: Delice Lesch, MD, ABPMR        Revision History

## 2018-12-04 ENCOUNTER — Encounter (HOSPITAL_COMMUNITY): Payer: Self-pay

## 2018-12-04 ENCOUNTER — Telehealth: Payer: Self-pay | Admitting: Vascular Surgery

## 2018-12-04 ENCOUNTER — Inpatient Hospital Stay (HOSPITAL_COMMUNITY): Payer: Medicare Other

## 2018-12-04 ENCOUNTER — Inpatient Hospital Stay (HOSPITAL_COMMUNITY): Payer: Medicare Other | Admitting: Occupational Therapy

## 2018-12-04 DIAGNOSIS — Z89511 Acquired absence of right leg below knee: Secondary | ICD-10-CM

## 2018-12-04 DIAGNOSIS — D638 Anemia in other chronic diseases classified elsewhere: Secondary | ICD-10-CM

## 2018-12-04 DIAGNOSIS — I10 Essential (primary) hypertension: Secondary | ICD-10-CM

## 2018-12-04 DIAGNOSIS — M792 Neuralgia and neuritis, unspecified: Secondary | ICD-10-CM

## 2018-12-04 DIAGNOSIS — D62 Acute posthemorrhagic anemia: Secondary | ICD-10-CM

## 2018-12-04 DIAGNOSIS — E1142 Type 2 diabetes mellitus with diabetic polyneuropathy: Secondary | ICD-10-CM

## 2018-12-04 DIAGNOSIS — G479 Sleep disorder, unspecified: Secondary | ICD-10-CM

## 2018-12-04 DIAGNOSIS — G546 Phantom limb syndrome with pain: Secondary | ICD-10-CM

## 2018-12-04 LAB — RENAL FUNCTION PANEL
Albumin: 2.1 g/dL — ABNORMAL LOW (ref 3.5–5.0)
Anion gap: 14 (ref 5–15)
BUN: 45 mg/dL — ABNORMAL HIGH (ref 8–23)
CO2: 24 mmol/L (ref 22–32)
Calcium: 8.9 mg/dL (ref 8.9–10.3)
Chloride: 99 mmol/L (ref 98–111)
Creatinine, Ser: 9.08 mg/dL — ABNORMAL HIGH (ref 0.61–1.24)
GFR calc Af Amer: 6 mL/min — ABNORMAL LOW (ref >60)
GFR calc non Af Amer: 5 mL/min — ABNORMAL LOW (ref >60)
Glucose, Bld: 91 mg/dL (ref 70–99)
Phosphorus: 5.6 mg/dL — ABNORMAL HIGH (ref 2.5–4.6)
Potassium: 4.5 mmol/L (ref 3.5–5.1)
Sodium: 137 mmol/L (ref 135–145)

## 2018-12-04 LAB — GLUCOSE, CAPILLARY
Glucose-Capillary: 70 mg/dL (ref 70–99)
Glucose-Capillary: 93 mg/dL (ref 70–99)
Glucose-Capillary: 105 mg/dL — ABNORMAL HIGH (ref 70–99)
Glucose-Capillary: 149 mg/dL — ABNORMAL HIGH (ref 70–99)
Glucose-Capillary: 158 mg/dL — ABNORMAL HIGH (ref 70–99)

## 2018-12-04 MED ORDER — TRAZODONE HCL 50 MG PO TABS: 50.0000 mg | ORAL_TABLET | Freq: Every day | ORAL | Status: DC

## 2018-12-04 MED ORDER — LIDOCAINE 5 % EX PTCH
1.0000 | MEDICATED_PATCH | CUTANEOUS | Status: DC
  Administered 2018-12-04 – 2018-12-13 (×10): 1 via TRANSDERMAL
  Filled 2018-12-04 (×13): qty 1

## 2018-12-04 MED ORDER — DARBEPOETIN ALFA 25 MCG/0.42ML IJ SOSY
PREFILLED_SYRINGE | INTRAMUSCULAR | Status: AC
  Filled 2018-12-04: qty 0.42

## 2018-12-04 MED ORDER — ONDANSETRON HCL 4 MG PO TABS
4.0000 mg | ORAL_TABLET | Freq: Three times a day (TID) | ORAL | Status: DC | PRN
  Administered 2018-12-04 – 2018-12-18 (×5): 4 mg via ORAL
  Filled 2018-12-04 (×6): qty 1

## 2018-12-04 MED ORDER — PRO-STAT SUGAR FREE PO LIQD
30.0000 mL | Freq: Two times a day (BID) | ORAL | Status: DC
  Administered 2018-12-04 – 2018-12-19 (×27): 30 mL via ORAL
  Filled 2018-12-04 (×28): qty 30

## 2018-12-04 MED ORDER — CHLORHEXIDINE GLUCONATE CLOTH 2 % EX PADS
6.0000 | MEDICATED_PAD | Freq: Every day | CUTANEOUS | Status: DC
  Administered 2018-12-04: 6 via TOPICAL

## 2018-12-04 MED ORDER — AMITRIPTYLINE HCL 10 MG PO TABS
10.0000 mg | ORAL_TABLET | Freq: Every day | ORAL | Status: DC
  Administered 2018-12-04 – 2018-12-18 (×15): 10 mg via ORAL
  Filled 2018-12-04 (×15): qty 1

## 2018-12-04 MED ORDER — DARBEPOETIN ALFA 25 MCG/0.42ML IJ SOSY
25.0000 ug | PREFILLED_SYRINGE | INTRAMUSCULAR | Status: DC
  Administered 2018-12-04: 25 ug via INTRAVENOUS
  Filled 2018-12-04: qty 0.42

## 2018-12-04 NOTE — Progress Notes (Signed)
[facility] PHYSICAL MEDICINE & REHABILITATION PROGRESS NOTE  Subjective/Complaints: Patient seen laying in bed this morning.  He states he did not sleep at all last night.  He states he could not get comfortable.  He states he has terrible phantom limb pain.  ROS: Denies CP, shortness of breath, nausea, vomiting, diarrhea.  Objective: Vital Signs: Blood pressure (!) 158/71, pulse 75, temperature 98.4 F (36.9 C), resp. rate 16, height 6\' 4"  (1.93 m), weight 120.2 kg, SpO2 97 %. Vas US Carotid  Result Date: 12/02/2018 Carotid Arterial Duplex Study Indications:  Bruit. Risk Factors: Hypertension, Diabetes. Performing Technologist: Maudry Mayhew MHA, RDMS, RVT, RDCS  Examination Guidelines: A complete evaluation includes B-mode imaging, spectral Doppler, color Doppler, and power Doppler as needed of all accessible portions of each vessel. Bilateral testing is considered an integral part of a complete examination. Limited examinations for reoccurring indications may be performed as noted.  Right Carotid Findings: +----------+--------+-------+--------+--------------------------------+--------+           PSV cm/sEDV    StenosisDescribe                        Comments                   cm/s                                                    +----------+--------+-------+--------+--------------------------------+--------+ CCA Prox  131     21                                                      +----------+--------+-------+--------+--------------------------------+--------+ CCA Distal87      19             smooth and heterogenous                  +----------+--------+-------+--------+--------------------------------+--------+ ICA Prox  136     30             smooth, heterogenous and                                                  calcific                                 +----------+--------+-------+--------+--------------------------------+--------+ ICA  Distal110     21                                                      +----------+--------+-------+--------+--------------------------------+--------+ ECA       143                                                             +----------+--------+-------+--------+--------------------------------+--------+ +----------+--------+-------+----------------+-------------------+  PSV cm/sEDV cmsDescribe        Arm Pressure (mmHG) +----------+--------+-------+----------------+-------------------+ [id number]            Multiphasic, WNL                    +----------+--------+-------+----------------+-------------------+ +---------+--------+--+--------+--+---------+ VertebralPSV cm/s86EDV cm/s27Antegrade +---------+--------+--+--------+--+---------+  Left Carotid Findings: +----------+--------+-------+--------+--------------------------------+--------+           PSV cm/sEDV    StenosisDescribe                        Comments                   cm/s                                                    +----------+--------+-------+--------+--------------------------------+--------+ CCA Prox  65      15                                                      +----------+--------+-------+--------+--------------------------------+--------+ CCA Distal86      25             smooth, heterogenous and                                                  calcific                                 +----------+--------+-------+--------+--------------------------------+--------+ ICA Prox  308     47             calcific and irregular                   +----------+--------+-------+--------+--------------------------------+--------+ ICA Distal116     33                                                      +----------+--------+-------+--------+--------------------------------+--------+ ECA       201     8              smooth and heterogenous                   +----------+--------+-------+--------+--------------------------------+--------+ +----------+--------+--------+----------+-------------------+ SubclavianPSV cm/sEDV cm/sDescribe  Arm Pressure (mmHG) +----------+--------+--------+----------+-------------------+           196             Monophasic                    +----------+--------+--------+----------+-------------------+ +---------+--------+--+--------+--+---------+ VertebralPSV cm/s55EDV cm/s16Antegrade +---------+--------+--+--------+--+---------+  Summary: Right Carotid: Velocities in the right ICA are consistent with a 1-39% stenosis. Left Carotid: Velocities in the left ICA are consistent with a 40-59% stenosis. Vertebrals:  Bilateral vertebral arteries demonstrate antegrade flow. Subclavians: Left subclavian artery was monophasic, suggestive of possible  proximal obstruction. Normal flow hemodynamics were seen in the              right subclavian artery. *See table(s) above for measurements and observations.  Electronically signed by Deitra Mayo MD on 12/02/2018 at 10:55:29 AM.    Final    Recent Labs    12/03/18 0325 12/03/18 1944  WBC 7.2 7.0  HGB 8.0* 8.0*  HCT 24.4* 24.8*  PLT 296 315   Recent Labs    12/03/18 0325 12/03/18 1944  NA 135 136  K 3.8 3.9  CL 95* 98  CO2 25 26  GLUCOSE 216* 169*  BUN 28* 37*  CREATININE 6.26* 7.87*  7.77*  CALCIUM 8.4* 8.7*    Physical Exam: BP (!) 158/71 (BP Location: Right Arm)   Pulse 75   Temp 98.4 F (36.9 C)   Resp 16   Ht 6\' 4"  (1.93 m)   Wt 120.2 kg   SpO2 97%   BMI 32.24 kg/m  Constitutional: No distress . Vital signs reviewed. Obese. HENT: Normocephalic.  Atraumatic. Eyes: EOMI. No discharge. Cardiovascular: No JVD. Respiratory: Normal effort. GI: Non-distended. Musc: Right BKA with edema and tenderness  Neurological: He is alert.  Patient is alert and follows full commands.  Motor: Bilateral upper extremities: 5/5 proximal  distal, left stronger than right Left lower extremity: Hip flexion, knee extension 5/5, ankle dorsiflexion 4-/5 Right lower extremity: Limited due to pain Skin: Amputation site with dressing C/D/I Psychiatric: He has a normal mood and affect. His behavior is normal.   Assessment/Plan: 1. Functional deficits secondary to right BKA which require 3+ hours per day of interdisciplinary therapy in a comprehensive inpatient rehab setting.  Physiatrist is providing close team supervision and 24 hour management of active medical problems listed below.  Physiatrist and rehab team continue to assess barriers to discharge/monitor patient progress toward functional and medical goals  Care Tool:  Bathing              Bathing assist       Upper Body Dressing/Undressing Upper body dressing Upper body dressing/undressing activity did not occur (including orthotics): N/A      Upper body assist      Lower Body Dressing/Undressing Lower body dressing    Lower body dressing activity did not occur: N/A       Lower body assist       Toileting Toileting Toileting Activity did not occur (Clothing management and hygiene only): N/A (no void or bm)  Toileting assist       Transfers Chair/bed transfer  Transfers assist  Chair/bed transfer activity did not occur: N/A        Locomotion Ambulation   Ambulation assist   Ambulation activity did not occur: N/A          Walk 10 feet activity   Assist  Walk 10 feet activity did not occur: N/A        Walk 50 feet activity   Assist Walk 50 feet with 2 turns activity did not occur: N/A         Walk 150 feet activity   Assist Walk 150 feet activity did not occur: N/A         Walk 10 feet on uneven surface  activity   Assist Walk 10 feet on uneven surfaces activity did not occur: N/A         Wheelchair     Assist     Wheelchair activity did not occur: N/A  Wheelchair 50 feet with 2  turns activity    Assist    Wheelchair 50 feet with 2 turns activity did not occur: N/A       Wheelchair 150 feet activity     Assist Wheelchair 150 feet activity did not occur: N/A          Medical Problem List and Plan: 1.   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  Antithrombotics: -DVT/anticoagulation:  Subcutaneous heparin             -antiplatelet therapy:  Plavix 75 mg daily 3. Pain Management:   Uncontrolled phantom limb pain at present  Lyrica 150 mg 3 times a day, discussed with pharmacy, appears to have been on medication for >/ 2 years, will not make any adjustments at this time  Elavil 10 started on 4/23  Lidoderm patch ordered on 4/23  Oxycodone as needed             Encourage desensitization techniques, mirror therapy 4. Mood: Melatonin 9 mg daily at bedtime  Trazodone d/ced  Elavil started on 4/23             -antipsychotic agents: Risperdal 0.25 mg daily 5. Neuropsych: This patient is capable of making decisions on his own behalf. 6. Skin/Wound Care:  Routine skin checks 7. Fluids/Electrolytes/Nutrition:  Routine I/Os.   8. Acute on chronic anemia. Follow-up CBC with HD.  Hb 8.0 on 4/22  Cont to monitor 9. End-stage renal disease. Continue hemodialysis 10. Diabetes mellitus and peripheral neuropathy. HbA1c 11.2 on 4/16.   Lantus insulin 85 units daily at bedtime.  Diabetic teaching.    Monitor with increased mobility 11. Hypertension.    Monitor with increased mobility 12. COPD with remote tobacco abuse. Continue nebulizer as needed 13. Hyperlipidemia. Lipitor 14. Hypothyroidism. Synthroid 15. GERD. Protonix 16. Constipation. Laxative assistance   LOS: 1 days A FACE TO FACE EVALUATION WAS PERFORMED   Lorie Phenix 12/04/2018, 8:15 AM

## 2018-12-04 NOTE — Progress Notes (Addendum)
[facility] Progress Note   Subjective:   Patient seen in room. Still having leg pain, 5/10 at present. Also reports a hx of intermittent tremor for several months, not related to dialysis or activity. Reports it happened 1 time during dialysis but is usually random, BP and BS were ok at time of onset. Denies SOB, dyspnea, CP, palpitations, abdominal pain, N/V/D. No fevers or chills.  Objective Vitals:   12/03/18 1815 12/03/18 1951 12/04/18 0420  BP: (!) 144/66 131/68 (!) 158/71  Pulse: 70 70 75  Resp: (!) 163 16 16  Temp: 97.7 F (36.5 C) 98.2 F (36.8 C) 98.4 F (36.9 C)  TempSrc: Oral    SpO2: 99% 96% 97%  Weight: 120.2 kg    Height: 6\' 4"  (1.93 m)     Physical Exam General: Well developed male, sitting in wheelchair in NAD. No tremor at present. Heart: RRR, no murmurs, rubs or gallops Lungs: CTA bilaterally without wheezing, rhonchi or rales Abdomen: Abdomen soft, non-tender, non-distended. + BS Extremities: No edema. R BKA, wrapped in bandage.  Dialysis Access: LUE AVF, +thrill  Additional Objective Labs: Basic Metabolic Panel: Recent Labs  Lab 12/02/18 0813 12/03/18 0325 12/03/18 1944  NA 136 135 136  K 4.7 3.8 3.9  CL 94* 95* 98  CO2 21* 25 26  GLUCOSE 144* 216* 169*  BUN 58* 28* 37*  CREATININE 10.18* 6.26* 7.87*  7.77*  CALCIUM 8.5* 8.4* 8.7*  PHOS  --   --  4.7*   Liver Function Tests: Recent Labs  Lab 12/03/18 1944  ALBUMIN 1.9*   No results for input(s): LIPASE, AMYLASE in the last 168 hours. CBC: Recent Labs  Lab 12/01/18 1414  12/02/18 0813 12/03/18 0325 12/03/18 1944  WBC 14.8*  --  11.2* 7.2 7.0  NEUTROABS 12.0*  --   --   --   --   HGB 8.4*   < > 8.7* 8.0* 8.0*  HCT 27.6*   < > 27.6* 24.4* 24.8*  MCV 98.6  --  95.2 93.1 95.8  PLT 333  --  311 296 315   < > = values in this interval not displayed.   Blood Culture    Component Value Date/Time   SDES BLOOD RIGHT HAND 12/01/2018 1509   SPECREQUEST  12/01/2018 1509     BOTTLES DRAWN AEROBIC AND ANAEROBIC Blood Culture results may not be optimal due to an inadequate volume of blood received in culture bottles   CULT  12/01/2018 1509    NO GROWTH 2 DAYS Performed at [facility], [place] [address], [place], [place]  [address]    REPTSTATUS PENDING 12/01/2018 1509    Cardiac Enzymes: No results for input(s): CKTOTAL, CKMB, CKMBINDEX, TROPONINI in the last 168 hours. CBG: Recent Labs  Lab 12/03/18 0705 12/03/18 1143 12/03/18 1613 12/03/18 2234 12/04/18 0619  GLUCAP 133* 142* 216* 151* 70   Iron Studies: No results for input(s): IRON, TIBC, TRANSFERRIN, FERRITIN in the last 72 hours. @lablastinr3 @ Studies/Results: No results found. Medications:  . amitriptyline  10 mg Oral QHS  . atorvastatin  80 mg Oral QHS  . calcium acetate  2,668 mg Oral TID WC  . cinacalcet  30 mg Oral QPM  . clopidogrel  75 mg Oral Daily  . docusate sodium  100 mg Oral Daily  . doxercalciferol  0.5 mcg Intravenous Q T,Th,Sa-HD  . heparin  5,000 Units Subcutaneous Q8H  . insulin aspart  0-15 Units Subcutaneous TID WC  . insulin glargine  85 Units Subcutaneous QHS  . levothyroxine  125 mcg Oral Q0600  . lidocaine  1 patch Transdermal Q24H  . Melatonin  9 mg Oral QHS  . pantoprazole  40 mg Oral Daily  . pregabalin  150 mg Oral TID  . risperiDONE  0.25 mg Oral Daily    Dialysis Orders: Center: [facility]  on TTS . EDW 119.5 kg; Time: 4.5 hr; 2K/2.5Ca BFR 400, DFR 600     Temp 35C Primary nephrologist: Dr. Lowanda Foster  Heparin 2000 unit bolus and 1000 units/hr Epogen 1000 units 3x week Hectorol 0.36mcg IV 3x week Venofer 100 mg IV 3x week  Recent labs 11/25/2018: Hgb 10.0, Ca 9.1, Phos 4.9, Corr Ca 9.2, K 5.1  Assessment/Plan: 1.  Ischemic R foot: S/p emergent amputation, now in inpatient rehab. Patient started on vanc and zosyn empirically. Further management per vascular surgery/primary. 2.  ESRD:  Normally TTS schedule, reports compliance with HD.  Due for dialysis today. K+ 4.3, will continue 2K bath. Wheezing on exam but not in respiratory distress. Will plan for HD today per regular schedule. No heparin today due to recent surgery.  3.  Hypertension/volume: Blood pressure slightly elevated today. No edema or SOB today. Will plan for HD today with UFG 2L.  4.  Anemia: Hgb 8.0 post op. Receiving epogen and venofer as outpatient. Aranesp ordered. Transfuse as needed.  5.  Metabolic bone disease:  Calcium 8.7, corrected 10.4. Will d/c hectorol and use lower Ca bath- follow.  Phos 4.7. Continue phoslo binder, sensipar.  6.  Nutrition:  Albumin 1.9- will start pro-stat supplement.  7. Diabetes mellitus with neuropathy: Management per primary. On lyrica.  8. COPD: No wheezing/SOB at present. Management per primary.   [name], PA-C 12/04/2018, 11:09 AM  [facility] Pager: [phone]  I have seen and examined this patient and agree with plan and assessment in the above note with renal recommendations/intervention highlighted.  Doing well and seen in HD.  Continue with HD qTTS Governor Rooks Idy Rawling,MD 12/04/2018 2:14 PM

## 2018-12-04 NOTE — Progress Notes (Signed)
Patient information reviewed and entered into [facility] by Becky Nitza Schmid, PPS coordinator. Information including medical coding, function ability, and quality indicators will be reviewed and updated through discharge.   

## 2018-12-04 NOTE — Telephone Encounter (Signed)
sch appt lvm [facility]ld ltr 01/01/2019 1130am p/o MD

## 2018-12-04 NOTE — Evaluation (Signed)
Occupational Therapy Assessment and Plan  Patient Details  Name: [name] MRN: [id number] Date of Birth: 1951/06/15  OT Diagnosis: acute pain, lumbago (low back pain) and muscle weakness (generalized) Rehab Potential: Rehab Potential (ACUTE ONLY): Good ELOS: 14-18 days   Today's Date: 12/04/2018 OT Individual Time: 0930-1030 OT Individual Time Calculation (min): 60 min     Problem List:  Patient Active Problem List   Diagnosis Date Noted  . Neuropathic pain   . S/P unilateral BKA (below knee amputation), right ([place])   . Sleep disturbance   . Type 2 diabetes mellitus with peripheral neuropathy (HCC)   . Benign essential HTN   . Acute blood loss anemia   . Anemia of chronic disease   . Right below-knee amputee ([place]) 12/03/2018  . Necrotizing fasciitis of ankle and foot ([place])   . Unilateral complete BKA, right, initial encounter ([place])   . Postoperative pain   . Phantom limb pain ([place])   . Drug induced constipation   . Poorly controlled type 2 diabetes mellitus with peripheral neuropathy ([place])   . Acute on chronic anemia   . Ischemia of extremity 12/01/2018  . PAD (peripheral artery disease) ([place]) 02/12/2017  . Critical lower limb ischemia 02/11/2017  . Acute respiratory failure with hypoxia ([place]) 08/28/2015  . Hypertensive urgency 08/28/2015  . Chronic anemia 08/28/2015  . ESRD on dialysis ([place]) 08/28/2015  . Diabetes mellitus with end stage renal disease ([place]) 08/28/2015  . PVD (peripheral vascular disease) ([place]) 07/15/2014  . Peripheral vascular disease, unspecified ([place]) 01/14/2014  . Pain in limb-left arm 04/09/2013  . Guaiac + stool 04/07/2013  . Anemia 04/07/2013  . Dysphagia, unspecified(787.20) 04/07/2013  . Numbness and tingling-left arm  02/04/2013  . Cold hands and feet-left arm 02/04/2013  . Other complications due to renal dialysis device, implant, and graft 06/06/2012  . End stage renal disease ([place]) 02/05/2012  . Injury to blood vessels, unspecified  site 02/05/2012  . Pain in joint, shoulder region 09/14/2011    Past Medical History:  Past Medical History:  Diagnosis Date  . Anemia   . Anxiety   . Arthritis   . Chronic kidney disease   . COPD (chronic obstructive pulmonary disease) ([place])    Pt denies  . Depression   . Diabetes mellitus without complication ([place])   . GERD (gastroesophageal reflux disease)   . H/O hiatal hernia   . Headache(784.0)    Hx: Migraines  . Hypertension   . Neuropathy   . Numbness of toes    toes and feet  . Pneumonia    Hx: of several times  . Renal insufficiency   . Shortness of breath dyspnea   . Type 2 diabetes mellitus ([place])    Past Surgical History:  Past Surgical History:  Procedure Laterality Date  . AMPUTATION Right 12/01/2018   Procedure: RIGHT AMPUTATION BELOW KNEE;  Surgeon: [name], MD;  Location: [facility] OR;  Service: Vascular;  Laterality: Right;  . AV FISTULA PLACEMENT  2012      left arm   . AV FISTULA PLACEMENT Left 12/18/2012   Procedure: ARTERIOVENOUS (AV) FISTULA CREATION;  Surgeon: Angelia Mould, MD;  Location: [facility];  Service: Vascular;  Laterality: Left;  . COLONOSCOPY  10/26/2011   Procedure: COLONOSCOPY;  Surgeon: Rogene Houston, MD;  Location: [facility] ENDO SUITE;  Service: Endoscopy;  Laterality: N/A;  730  . EMBOLECTOMY Left 12/09/2012   Procedure: EMBOLECTOMY;  Surgeon: Serafina Mitchell, MD;  Location: [facility]  CATH LAB;  Service: Cardiovascular;  Laterality: Left;  left arm venous embolization  . ESOPHAGOGASTRODUODENOSCOPY (EGD) WITH ESOPHAGEAL DILATION N/A 04/23/2013   Procedure: ESOPHAGOGASTRODUODENOSCOPY (EGD) WITH ESOPHAGEAL DILATION;  Surgeon: Rogene Houston, MD;  [facility];  Service: Endoscopy;  Laterality: N/A;  200-moved to 930   . FISTULA SUPERFICIALIZATION Left 06/18/2013   Procedure: FISTULA SUPERFICIALIZATION & LIGATION BRANCH X 1;  Surgeon: Mal Misty, MD;  Location: [facility] [facility];  Service: Vascular;  Laterality: Left;  . HEMATOMA EVACUATION  Right 02/11/2017   Procedure: EVACUATION HEMATOMA RIGHT GROIN, Repair of Right Pseudo-anerysm.;  Surgeon: [name], MD;  Location: [facility] OR;  Service: Vascular;  Laterality: Right;  . INGUINAL HERNIA REPAIR     ,  times   2  . INSERTION OF DIALYSIS CATHETER Left 12/18/2012   Procedure: INSERTION OF DIALYSIS CATHETER;  Surgeon: Angelia Mould, MD;  Location: [facility];  Service: Vascular;  Laterality: Left;  . KNEE ARTHROSCOPY  2011   Right Knee  . LOWER EXTREMITY ANGIOGRAPHY N/A 02/11/2017   Procedure: Lower Extremity Angiography;  Surgeon: Lorretta Harp, MD;  Location: [facility] CV LAB;  Service: Cardiovascular;  Laterality: N/A;  . PERIPHERAL ATHRECTOMY  02/11/2017  . PERIPHERAL VASCULAR ATHERECTOMY Left 02/11/2017   Procedure: Peripheral Vascular Atherectomy;  Surgeon: Lorretta Harp, MD;  Location: [facility] CV LAB;  Service: Cardiovascular;  Laterality: Left;  . REVISON OF ARTERIOVENOUS FISTULA Left 3/00/7622   Procedure: PLICATION OF LEFT BRACHIOCEPHALIC ARTERIOVENOUS FISTULA;  Surgeon: Conrad Nome, MD;  Location: [facility];  Service: Vascular;  Laterality: Left;  . SHUNTOGRAM N/A 12/09/2012   Procedure: fistulogram;  Surgeon: Serafina Mitchell, MD;  Location[facility] CATH LAB;  Service: Cardiovascular;  Laterality: N/A;  . SHUNTOGRAM Left 06/03/2013   Procedure: Fistulogram;  Surgeon: Serafina Mitchell, MD;  Location: [facility] CATH LAB;  Service: Cardiovascular;  Laterality: Left;  . THROMBECTOMY W/ EMBOLECTOMY Left 12/11/2012   Procedure: THROMBECTOMY ARTERIOVENOUS FISTULA;  Surgeon: Serafina Mitchell, MD;  Location: [facility] [place];  Service: Vascular;  Laterality: Left;  . TONSILLECTOMY      Assessment & Plan Clinical Impression: Patient is a 68 y.o. right-handed male history of COPD who quit smoking 6 years ago, end-stage renal disease with hemodialysis Tuesday Thursday and Saturday at [facility] in [facility], diabetes mellitus peripheral neuropathy, hypertension and peripheral vascular disease  maintained on Plavix.  Per chart review and patient, patient lives with his wife. Independent ADLs and uses a Rollator for functional mobility. one level home with ramped entrance. Presented 12/01/2018 with ischemic right foot. Per report patient turned his right great toe approximately 1 week prior and developed a black line on the bottom of the toe.  X-rays were performed which showed gaseous changes, concerning for necrotizing infection.  Limb was not felt to be salvageable. 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 [name].Hospital course complicated by pain.  Hemodialysis ongoing as per renal services. A carotid Dopplers done for evaluation of bruit showed left ICA stenosis 40-59% and monitored.  Hospital course further complicated by acute on chronic anemia.  Patient notes that he had a fall prior to this and has had gluteal pain since that time.  He is unsure how he is going to be able to tolerate rehab.  Subcutaneous heparin for DVT prophylaxis. Therapy evaluation completed with recommendations of physical medicine rehabilitation consult. Patient was admitted for a comprehensive rehabilitation program.   Patient transferred to CIR on 12/03/2018 .  Patient currently requires max with basic self-care skills secondary to muscle weakness, impaired sensation in BUE/BLE and decreased standing balance and decreased balance strategies.  Prior to hospitalization, patient could complete ADLs with modified independent .  Patient will benefit from skilled intervention to increase independence with basic self-care skills prior to discharge home with care partner.  Anticipate patient will require 24 hour supervision and follow up home health.  OT - End of Session Activity Tolerance: Tolerates 30+ min activity with multiple rests Endurance Deficit: Yes Endurance Deficit Description: limited participation due to nausea OT Assessment Rehab Potential (ACUTE ONLY): Good OT Barriers to Discharge:  Hemodialysis;Wound Care OT Patient demonstrates impairments in the following area(s): Balance;Endurance;Motor;Pain;Safety;Sensory;Skin Integrity OT Basic ADL's Functional Problem(s): Grooming;Bathing;Dressing;Toileting OT Transfers Functional Problem(s): Toilet;Tub/Shower OT Additional Impairment(s): None OT Plan OT Intensity: Minimum of 1-2 x/day, 45 to 90 minutes OT Frequency: 5 out of 7 days OT Duration/Estimated Length of Stay: 14-18 days OT Treatment/Interventions: Medical illustrator training;Community reintegration;Discharge planning;Disease mangement/prevention;DME/adaptive equipment instruction;Functional mobility training;Neuromuscular re-education;Pain management;Patient/family education;Psychosocial support;Self Care/advanced ADL retraining;Skin care/wound managment;Splinting/orthotics;Therapeutic Activities;Therapeutic Exercise;UE/LE Strength taining/ROM;Wheelchair propulsion/positioning OT Basic Self-Care Anticipated Outcome(s): Supervision OT Toileting Anticipated Outcome(s): Supervision OT Bathroom Transfers Anticipated Outcome(s): Supervision OT Recommendation Recommendations for Other Services: Neuropsych consult Patient destination: Home Follow Up Recommendations: 24 hour supervision/assistance;Home health OT Equipment Recommended: None recommended by OT(pt has all recommended equipment)   Skilled Therapeutic Intervention OT eval completed with discussion of rehab process, OT purpose, POC, ELOS, and goals.  Limited ADL assessment due to nausea.  Pt completed UB bathing seated at sink with setup to obtain items.  Pt refused attempts to stand for LB bathing.  Per report from PT, unable to lift buttocks from EOB >2 inches in attempts at standing.  Therapist obtained wide drop arm BSC for toilet transfers to allow for use of slide board, pt refused to attempt at this time again referencing nausea.  Pt self-limiting throughout session.  RN aware of pt status.  OT  Evaluation Precautions/Restrictions  Precautions Precautions: Fall Restrictions Weight Bearing Restrictions: Yes RLE Weight Bearing: Non weight bearing Pain Pain Assessment Pain Scale: 0-10 Pain Score: 6  Pain Type: Surgical pain;Phantom pain Pain Location: Leg Pain Orientation: Right Pain Descriptors / Indicators: Aching Pain Onset: On-going Pain Intervention(s): (premedicated) Home Living/Prior Functioning Home Living Family/patient expects to be discharged to:: Private residence Living Arrangements: Spouse/significant other Available Help at Discharge: Family, Available 24 hours/day Type of Home: House Home Access: Ramped entrance Home Layout: One level Bathroom Shower/Tub: Tub/shower unit(has tub bench) Bathroom Toilet: Standard Bathroom Accessibility: Yes  Lives With: Spouse IADL History Homemaking Responsibilities: Yes Meal Prep Responsibility: Secondary Cleaning Responsibility: Secondary Prior Function Level of Independence: Independent with basic ADLs, Independent with gait  Able to Take Stairs?: No Driving: Yes 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, out in [employer] Baseline Vision/History: Wears glasses Wears Glasses: Reading only Patient Visual Report: Blurring of vision(reports change in distance vision) Cognition Overall Cognitive Status: Within Functional Limits for tasks assessed Arousal/Alertness: Awake/alert Orientation Level: Person;Place;Situation Person: Oriented Place: Oriented Situation: Oriented Year: 2020 Month: April Day of Week: Correct Memory: Appears intact Immediate Memory Recall: Sock;Blue;Bed Memory Recall: Bed;Blue;Sock Memory Recall Sock: Without Cue Memory Recall Blue: Without Cue Memory Recall Bed: Without Cue Attention: Selective Selective Attention: Appears intact Awareness: Appears intact Safety/Judgment: Appears intact Sensation Sensation Light Touch: Impaired Detail Peripheral  sensation comments: stocking distribution LLE to mid calf; also numbness bil fingertips, L more affected than R Light Touch Impaired Details: Absent LLE Proprioception:  Impaired Detail Proprioception Impaired Details: Absent LLE(L ankle 0/5 movements; absent kinesthesia also) Coordination Gross Motor Movements are Fluid and Coordinated: Yes Fine Motor Movements are Fluid and Coordinated: Yes Motor  Motor Motor: Other (comment) Motor - Skilled Clinical Observations: pt has LLE tremors at rest; he states this is new; generalized weakness Mobility  Bed Mobility Bed Mobility: Rolling Right;Rolling Left;Right Sidelying to Sit  Extremity/Trunk Assessment RUE Assessment RUE Assessment: Within Functional Limits General Strength Comments: grossly 5/5 LUE Assessment LUE Assessment: Within Functional Limits General Strength Comments: grossly 5/5, marginally stronger than Rt     Refer to Care Plan for Long Term Goals  Recommendations for other services: Neuropsych   Discharge Criteria: Patient will be discharged from OT if patient refuses treatment 3 consecutive times without medical reason, if treatment goals not met, if there is a change in medical status, if patient makes no progress towards goals or if patient is discharged from hospital.  The above assessment, treatment plan, treatment alternatives and goals were discussed and mutually agreed upon: by patient  [place] [facility] 12/04/2018, 11:42 AM

## 2018-12-04 NOTE — Care Management Note (Signed)
[facility] Individual Statement of Services  Patient Name:  [name]  Date:  12/04/2018  Welcome to the [facility].  Our goal is to provide you with an individualized program based on your diagnosis and situation, designed to meet your specific needs.  With this comprehensive rehabilitation program, you will be expected to participate in at least 3 hours of rehabilitation therapies Monday-Friday, with modified therapy programming on the weekends.  Your rehabilitation program will include the following services:  Physical Therapy (PT), Occupational Therapy (OT), 24 hour per day rehabilitation nursing, Neuropsychology, Case Management (Social Worker), Rehabilitation Medicine, Nutrition Services and Pharmacy Services  Weekly team conferences will be held on Wednesday to discuss your progress.  Your Social Worker will talk with you frequently to get your input and to update you on team discussions.  Team conferences with you and your family in attendance may also be held.  Expected length of stay: 14-18 days Overall anticipated outcome: supervision with cueing  Depending on your progress and recovery, your program may change. Your Social Worker will coordinate services and will keep you informed of any changes. Your Social Worker's name and contact numbers are listed  below.  The following services may also be recommended but are not provided by the [facility] will be made to provide these services after discharge if needed.  Arrangements include referral to agencies that provide these services.  Your insurance has been verified to be:  [place] Your primary doctor is:  Gar Ponto  Pertinent information will be shared with your doctor and your insurance company.  Social Worker:  Ovidio Kin, [place] or ([phone]  Information discussed with and copy given to patient by: Elease Hashimoto, 12/04/2018, 10:24 AM

## 2018-12-04 NOTE — Progress Notes (Signed)
Social Work  Social Work Assessment and Plan  Patient Details  Name: [name] MRN: [id number] Date of Birth: [birth year]  Today's Date: 12/04/2018  Problem List:  Patient Active Problem List   Diagnosis Date Noted  . Neuropathic pain   . S/P unilateral BKA (below knee amputation), right ([place])   . Sleep disturbance   . Type 2 diabetes mellitus with peripheral neuropathy (HCC)   . Benign essential HTN   . Acute blood loss anemia   . Anemia of chronic disease   . Right below-knee amputee ([place]) 12/03/2018  . Necrotizing fasciitis of ankle and foot ([place])   . Unilateral complete BKA, right, initial encounter ([place])   . Postoperative pain   . Phantom limb pain ([place])   . Drug induced constipation   . Poorly controlled type 2 diabetes mellitus with peripheral neuropathy ([place])   . Acute on chronic anemia   . Ischemia of extremity 12/01/2018  . PAD (peripheral artery disease) ([place]) 02/12/2017  . Critical lower limb ischemia 02/11/2017  . Acute respiratory failure with hypoxia ([place]) 08/28/2015  . Hypertensive urgency 08/28/2015  . Chronic anemia 08/28/2015  . ESRD on dialysis ([place]) 08/28/2015  . Diabetes mellitus with end stage renal disease ([place]) 08/28/2015  . PVD (peripheral vascular disease) ([place]) 07/15/2014  . Peripheral vascular disease, unspecified ([place]) 01/14/2014  . Pain in limb-left arm 04/09/2013  . Guaiac + stool 04/07/2013  . Anemia 04/07/2013  . Dysphagia, unspecified(787.20) 04/07/2013  . Numbness and tingling-left arm  02/04/2013  . Cold hands and feet-left arm 02/04/2013  . Other complications due to renal dialysis device, implant, and graft 06/06/2012  . End stage renal disease ([place]) 02/05/2012  . Injury to blood vessels, unspecified site 02/05/2012  . Pain in joint, shoulder region 09/14/2011   Past Medical History:  Past Medical History:  Diagnosis Date  . Anemia   . Anxiety   . Arthritis   . Chronic kidney disease   . COPD (chronic obstructive  pulmonary disease) ([place])    Pt denies  . Depression   . Diabetes mellitus without complication ([place])   . GERD (gastroesophageal reflux disease)   . H/O hiatal hernia   . Headache(784.0)    Hx: Migraines  . Hypertension   . Neuropathy   . Numbness of toes    toes and feet  . Pneumonia    Hx: of several times  . Renal insufficiency   . Shortness of breath dyspnea   . Type 2 diabetes mellitus ([place])    Past Surgical History:  Past Surgical History:  Procedure Laterality Date  . AMPUTATION Right 12/01/2018   Procedure: RIGHT AMPUTATION BELOW KNEE;  Surgeon: [name], MD;  Location: [facility] OR;  Service: Vascular;  Laterality: Right;  . AV FISTULA PLACEMENT  2012      left arm   . AV FISTULA PLACEMENT Left 12/18/2012   Procedure: ARTERIOVENOUS (AV) FISTULA CREATION;  Surgeon: Angelia Mould, MD;  Location: [place];  Service: Vascular;  Laterality: Left;  . COLONOSCOPY  10/26/2011   Procedure: COLONOSCOPY;  Surgeon: Rogene Houston, MD;  [facility];  Service: Endoscopy;  Laterality: N/A;  730  . EMBOLECTOMY Left 12/09/2012   Procedure: EMBOLECTOMY;  Surgeon: Serafina [place], MD;  Location: [facility] CATH LAB;  Service: Cardiovascular;  Laterality: Left;  left arm venous embolization  . ESOPHAGOGASTRODUODENOSCOPY (EGD) WITH ESOPHAGEAL DILATION N/A 04/23/2013   Procedure: ESOPHAGOGASTRODUODENOSCOPY (EGD) WITH ESOPHAGEAL DILATION;  Surgeon: Rogene Houston, MD;  [facility];  Service: Endoscopy;  Laterality: N/A;  200-moved to 930   . FISTULA SUPERFICIALIZATION Left 06/18/2013   Procedure: FISTULA SUPERFICIALIZATION & LIGATION BRANCH X 1;  Surgeon: Mal Misty, MD;  Location: [place];  Service: Vascular;  Laterality: Left;  . HEMATOMA EVACUATION Right 02/11/2017   Procedure: EVACUATION HEMATOMA RIGHT GROIN, Repair of Right Pseudo-anerysm.;  Surgeon: [name], MD;  Location: [facility] OR;  Service: Vascular;  Laterality: Right;  . INGUINAL HERNIA REPAIR     ,  times    2  . INSERTION OF DIALYSIS CATHETER Left 12/18/2012   Procedure: INSERTION OF DIALYSIS CATHETER;  Surgeon: Angelia Mould, MD;  Location: [place];  Service: Vascular;  Laterality: Left;  . KNEE ARTHROSCOPY  2011   Right Knee  . LOWER EXTREMITY ANGIOGRAPHY N/A 02/11/2017   Procedure: Lower Extremity Angiography;  Surgeon: Lorretta Harp, MD;  Location: [place] CV LAB;  Service: Cardiovascular;  Laterality: N/A;  . PERIPHERAL ATHRECTOMY  02/11/2017  . PERIPHERAL VASCULAR ATHERECTOMY Left 02/11/2017   Procedure: Peripheral Vascular Atherectomy;  Surgeon: Lorretta Harp, MD;  Location: [place] CV LAB;  Service: Cardiovascular;  Laterality: Left;  . REVISON OF ARTERIOVENOUS FISTULA Left 3/71/6967   Procedure: PLICATION OF LEFT BRACHIOCEPHALIC ARTERIOVENOUS FISTULA;  Surgeon: Conrad Montgomery, MD;  Location: [place];  Service: Vascular;  Laterality: Left;  . SHUNTOGRAM N/A 12/09/2012   Procedure: fistulogram;  Surgeon: Serafina Mitchell, MD;  Location: [facility] CATH LAB;  Service: Cardiovascular;  Laterality: N/A;  . SHUNTOGRAM Left 06/03/2013   Procedure: Fistulogram;  Surgeon: Serafina Mitchell, MD;  Location: [facility] CATH LAB;  Service: Cardiovascular;  Laterality: Left;  . THROMBECTOMY W/ EMBOLECTOMY Left 12/11/2012   Procedure: THROMBECTOMY ARTERIOVENOUS FISTULA;  Surgeon: Serafina [place], MD;  Location: [place];  Service: Vascular;  Laterality: Left;  . TONSILLECTOMY     Social History:  reports that he quit smoking about 6 years ago. His smoking use included cigars. He quit after 10.00 years of use. He has never used smokeless tobacco. He reports that he does not drink alcohol or use drugs.  Family / Support Systems Marital Status: Married Patient Roles: Spouse, Parent Spouse/Significant Other: [name] [phone]  [phone] Children: Monico Hoar [phone]  [phone] Other Supports: Another child who is involved and local also Anticipated Caregiver: Wife Ability/Limitations  of Caregiver: has some health issues Caregiver Availability: 24/7 Family Dynamics: Close knit family who are supportive of one another and will assist when one of them has needs. Pt voiced he was independent prior to this surgery. He has friends and neighbors who are supportive and will visit him once home.  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   Abuse/Neglect Abuse/Neglect Assessment Can Be Completed: Yes Physical Abuse: Denies Verbal Abuse: Denies Sexual Abuse: Denies Exploitation of patient/patient's resources: Denies Self-Neglect: Denies  Emotional Status Pt's affect, behavior and adjustment status: Pt is motivated to recover and regain his independence, he voiced his wife has health issues fo her own and both were helping one another before this. He will work hard and get as independent as possible  Recent Psychosocial Issues: other health issues-managed by his PCP Psychiatric History: History of anxiety/depression-takes medications for this and finds them helpful. Do feel he would benefit from seeing  neuro-psych while here for coping. Will make referral to be seen Substance Abuse History: No issues  Patient / Family Perceptions, Expectations & Goals Pt/Family understanding of illness & functional limitations: Pt and wife are able to explain his surgery and reason for it. They do talk with the MD daily and feel they have a good understanding of his treatment plan going forward. Premorbid pt/family roles/activities: husband, father, grandfather, HD pt, retiree, neighbor, etc Anticipated changes in roles/activities/participation: resume Pt/family expectations/goals: Pt states: " I want to be as independent  as I can be before I leave here."  Wife states: " I will help but I can't lift him."  [employer]: Other (Comment)([facility] HD T, TH & Sat) Premorbid Home Care/DME Agencies: Other (Comment)(has rw, bsc, wc tub bench) Transportation available at discharge: Wife Resource referrals recommended: Neuropsychology, Support group (specify)  Discharge Planning Living Arrangements: Spouse/significant other Support Systems: Spouse/significant other, Children, Other relatives, Friends/neighbors, Church/faith community Type of Residence: Private residence Insurance Resources: [employer], Multimedia programmer (specify)(BCBS) Financial Resources: Fish farm manager, Family Support Financial Screen Referred: No Living Expenses: Own Money Management: Patient, Spouse Does the patient have any problems obtaining your medications?: No Home Management: Wife Patient/Family Preliminary Plans: Return home with wife who can provide assist and transportation. Pt hopes to be mod/i before discharge from rehab. Will await team evaluations and work on a safe discharge plan. Social Work Anticipated Follow Up Needs: HH/OP, Support Group  Clinical Impression Pleasant gentleman who is not feeling well today but is trying to push through and participate in therapies. Do feel he would benefit from seeing neuro-psych for coping while here. Will await therapy evaluations and work on a discharge plans.   Elease Hashimoto 12/04/2018, 10:20 AM

## 2018-12-04 NOTE — Evaluation (Signed)
Physical Therapy Assessment and Plan  Patient Details  Name: [name] MRN: [id number] Date of Birth: Nov 06, 1950  PT Diagnosis: Abnormality of gait, Contracture of joint: R knee, Edema, Impaired sensation, Muscle weakness and Pain in R residual limb Rehab Potential:   ELOS:     Today's Date: 12/04/2018 PT Individual Time:0800-0905  -  PT Individual Time Calculation (min): 65 and 60 min    Problem List:  Patient Active Problem List   Diagnosis Date Noted  . Neuropathic pain   . S/P unilateral BKA (below knee amputation), right ([place])   . Sleep disturbance   . Type 2 diabetes mellitus with peripheral neuropathy (HCC)   . Benign essential HTN   . Acute blood loss anemia   . Anemia of chronic disease   . Right below-knee amputee ([place]) 12/03/2018  . Necrotizing fasciitis of ankle and foot ([place])   . Unilateral complete BKA, right, initial encounter ([place])   . Postoperative pain   . Phantom limb pain ([place])   . Drug induced constipation   . Poorly controlled type 2 diabetes mellitus with peripheral neuropathy ([place])   . Acute on chronic anemia   . Ischemia of extremity 12/01/2018  . PAD (peripheral artery disease) ([place]) 02/12/2017  . Critical lower limb ischemia 02/11/2017  . Acute respiratory failure with hypoxia ([place]) 08/28/2015  . Hypertensive urgency 08/28/2015  . Chronic anemia 08/28/2015  . ESRD on dialysis ([place]) 08/28/2015  . Diabetes mellitus with end stage renal disease ([place]) 08/28/2015  . PVD (peripheral vascular disease) ([place]) 07/15/2014  . Peripheral vascular disease, unspecified ([place]) 01/14/2014  . Pain in limb-left arm 04/09/2013  . Guaiac + stool 04/07/2013  . Anemia 04/07/2013  . Dysphagia, unspecified(787.20) 04/07/2013  . Numbness and tingling-left arm  02/04/2013  . Cold hands and feet-left arm 02/04/2013  . Other complications due to renal dialysis device, implant, and graft 06/06/2012  . End stage renal disease ([place]) 02/05/2012  . Injury to blood  vessels, unspecified site 02/05/2012  . Pain in joint, shoulder region 09/14/2011    Past Medical History:  Past Medical History:  Diagnosis Date  . Anemia   . Anxiety   . Arthritis   . Chronic kidney disease   . COPD (chronic obstructive pulmonary disease) ([place])    Pt denies  . Depression   . Diabetes mellitus without complication ([place])   . GERD (gastroesophageal reflux disease)   . H/O hiatal hernia   . Headache(784.0)    Hx: Migraines  . Hypertension   . Neuropathy   . Numbness of toes    toes and feet  . Pneumonia    Hx: of several times  . Renal insufficiency   . Shortness of breath dyspnea   . Type 2 diabetes mellitus ([place])    Past Surgical History:  Past Surgical History:  Procedure Laterality Date  . AMPUTATION Right 12/01/2018   Procedure: RIGHT AMPUTATION BELOW KNEE;  Surgeon: [name], MD;  Location: [facility] OR;  Service: Vascular;  Laterality: Right;  . AV FISTULA PLACEMENT  2012      left arm   . AV FISTULA PLACEMENT Left 12/18/2012   Procedure: ARTERIOVENOUS (AV) FISTULA CREATION;  Surgeon: Angelia Mould, MD;  Location: [place];  Service: Vascular;  Laterality: Left;  . COLONOSCOPY  10/26/2011   Procedure: COLONOSCOPY;  Surgeon: Rogene Houston, MD;  [facility];  Service: Endoscopy;  Laterality: N/A;  730  . EMBOLECTOMY Left 12/09/2012   Procedure: EMBOLECTOMY;  Surgeon: Serafina [place], MD;  Location: [facility] CATH LAB;  Service: Cardiovascular;  Laterality: Left;  left arm venous embolization  . ESOPHAGOGASTRODUODENOSCOPY (EGD) WITH ESOPHAGEAL DILATION N/A 04/23/2013   Procedure: ESOPHAGOGASTRODUODENOSCOPY (EGD) WITH ESOPHAGEAL DILATION;  Surgeon: Rogene Houston, MD;  [facility];  Service: Endoscopy;  Laterality: N/A;  200-moved to 930   . FISTULA SUPERFICIALIZATION Left 06/18/2013   Procedure: FISTULA SUPERFICIALIZATION & LIGATION BRANCH X 1;  Surgeon: Mal Misty, MD;  Location: [place];  Service: Vascular;  Laterality: Left;  .  HEMATOMA EVACUATION Right 02/11/2017   Procedure: EVACUATION HEMATOMA RIGHT GROIN, Repair of Right Pseudo-anerysm.;  Surgeon: [name], MD;  [facility];  Service: Vascular;  Laterality: Right;  . INGUINAL HERNIA REPAIR     ,  times   2  . INSERTION OF DIALYSIS CATHETER Left 12/18/2012   Procedure: INSERTION OF DIALYSIS CATHETER;  Surgeon: Angelia Mould, MD;  Location: [place];  Service: Vascular;  Laterality: Left;  . KNEE ARTHROSCOPY  2011   Right Knee  . LOWER EXTREMITY ANGIOGRAPHY N/A 02/11/2017   Procedure: Lower Extremity Angiography;  Surgeon: Lorretta Harp, MD;  Location: [place] CV LAB;  Service: Cardiovascular;  Laterality: N/A;  . PERIPHERAL ATHRECTOMY  02/11/2017  . PERIPHERAL VASCULAR ATHERECTOMY Left 02/11/2017   Procedure: Peripheral Vascular Atherectomy;  Surgeon: Lorretta Harp, MD;  Location: [place] CV LAB;  Service: Cardiovascular;  Laterality: Left;  . REVISON OF ARTERIOVENOUS FISTULA Left 8/50/2774   Procedure: PLICATION OF LEFT BRACHIOCEPHALIC ARTERIOVENOUS FISTULA;  Surgeon: Conrad [place], MD;  Location: [place];  Service: Vascular;  Laterality: Left;  . SHUNTOGRAM N/A 12/09/2012   Procedure: fistulogram;  Surgeon: Serafina [place], MD;  Location: [facility] CATH LAB;  Service: Cardiovascular;  Laterality: N/A;  . SHUNTOGRAM Left 06/03/2013   Procedure: Fistulogram;  Surgeon: Serafina [place], MD;  Location: [facility] CATH LAB;  Service: Cardiovascular;  Laterality: Left;  . THROMBECTOMY W/ EMBOLECTOMY Left 12/11/2012   Procedure: THROMBECTOMY ARTERIOVENOUS FISTULA;  Surgeon: Serafina [place], MD;  Location: [place];  Service: Vascular;  Laterality: Left;  . TONSILLECTOMY      Assessment & Plan Clinical Impression: [name] is a 68 year old right-handed male history of COPD who quit smoking 6 years ago, end-stage renal disease with hemodialysis Tuesday Thursday and Saturday at [facility] in [facility], diabetes mellitus peripheral neuropathy, hypertension  and peripheral vascular disease maintained on Plavix.  Per chart review and patient, patient lives with his wife. Independent ADLs and uses a Rollator for functional mobility. one level home with ramped entrance. Presented 12/01/2018 with ischemic right foot. Per report patient turned his right great toe approximately 1 week prior and developed a black line on the bottom of the toe.  X-rays were performed which showed gaseous changes, concerning for necrotizing infection.  Limb was not felt to be salvageable. 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 [name].Hospital course complicated by pain.  Hemodialysis ongoing as per renal services. A carotid Dopplers done for evaluation of bruit showed left ICA stenosis 40-59% and monitored.  Hospital course further complicated by acute on chronic anemia.  Patient notes that he had a fall prior to this and has had gluteal pain since that time.  He is unsure how he is going to be able to tolerate rehab.  Subcutaneous heparin for DVT prophylaxis.  Patient transferred to CIR on 12/03/2018 .   Patient currently requires mod with mobility at w/c level,  secondary to muscle weakness and muscle joint tightness, decreased cardiorespiratoy endurance and decreased sitting balance.  Prior to hospitalization, patient was modified independent  with mobility and lived with Spouse in a House home.  Home access is  Ramped entrance.  Patient will benefit from skilled PT intervention to maximize safe functional mobility, minimize fall risk and decrease caregiver burden for planned discharge home with 24 hour supervision.  Anticipate patient will benefit from follow up [place] at discharge.     Skilled Therapeutic Intervention  tx 1:  Pt quite anxious and reluctant to participate in tx due to R residual pain.  PT provided emotional support, and contacted RN about pain.  See evaluation details below.  Therapeutic exercises performed with LE to increase strength for functional mobility:  10 x 1 each L straight leg raises, R quad sets with towel roll under residual limb. Using bed features, supine> sit with min assist.  Pt sat EOB and reached within BOS in all directions with stand by assistance.  Slide board transfer keeping bed pad under him as pt is not wearing pants.  Upon transferring, pt became nauseated, and had dry heaves.  He stated that he was "too empty" to bring anything up.  PT notified Deidre Ala, RN.  PT later discussed with pt the need for intake so that he will heal; anti-nausea meds may be necessary. He stated that he is often nauseated early in the morning at home, related to HD. Pt remained seated in w/c at end of session, with needs at hand and seat belt alarm set.  Pt able to demonstrate use of call bell for help.   tx 2:  Pt sitting in w/c.  W/c propulsion using bil hands with supervision for steering and turns, x 150'.  Seated strengthening activity using 5 # weighted bar to tap beach ball x 20 reps,   Bil adductor squeezes against towel roll x 10.  Pt put together PVC puzzle seated in w/c, L foot on floor and R residual limb flexed at knee, requiring trunk flexion/extension x 15 to reach for pieces.  Pt needed 1 cue for accuracy.  Slide board transfer to return to bed, slightly downhill, mod assist, using bed pad as pt does not have on pants.  PT instructed pt in head/hips relationship, with fair carryover.  Sit> supine with supervision. Pt left resting flat in supine for prolonged bil hip flexor stretch.  He stated that he has not been in prone for years, and found it uncomfortable.  Bed alarm set and needs left at hand.   PT Evaluation Precautions/Restrictions- fall; NWBing RLE   General   Vital SignsTherapy Vitals Pulse Rate: (P) 77 Resp: (P) 17 BP: (P) 130/66 Pain tx 1:  Pain Assessment Pain Scale: 0-10 Pain Score: 6  Pain Type: Surgical pain;Phantom pain Pain Location: Leg Pain Orientation: Right Pain Descriptors / Indicators: Aching Pain Onset:  On-going Pain Intervention(s): (premedicated)  tx 2:  Pain 3/10 R residual limb, premedicated Home Living/Prior Functioning Home Living Living Arrangements: Spouse/significant other Available Help at Discharge: Family;Available 24 hours/day Type of Home: House Home Access: Ramped entrance Home Layout: One level Bathroom Shower/Tub: Tub/shower unit(has tub bench) Bathroom Toilet: Standard Bathroom Accessibility: Yes  Lives With: Spouse Prior Function Level of Independence: Independent with basic ADLs;Independent with gait  Able to Take Stairs?: No Driving: Yes 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  He feels his distance  vision is blurry sometimes.  Pt tracked well in all quadrants; no diplopia.     Cognition Overall Cognitive Status: Within Functional Limits for tasks assessed Arousal/Alertness: Awake/alert Orientation Level: Oriented X4 Attention: Selective Selective Attention: Appears intact Memory: Appears intact Awareness: Appears intact Safety/Judgment: Appears intact Sensation Sensation Light Touch: Impaired Detail Peripheral sensation comments: stocking distribution LLE to mid calf; also numbness bil fingertips, L more affected than R Light Touch Impaired Details: Absent LLE Proprioception: Impaired Detail Proprioception Impaired Details: Absent LLE(L ankle 0/5 movements; absent kinesthesia also) Coordination Gross Motor Movements are Fluid and Coordinated: Yes Fine Motor Movements are Fluid and Coordinated: Yes Motor  Motor Motor: Other (comment) Motor - Skilled Clinical Observations: pt has LLE tremors at rest; he states this is new; generalized weakness  Mobility Bed Mobility Bed Mobility: Rolling Right;Rolling Left;Right Sidelying to Sit Transfers Transfers: Lateral/Scoot Transfers Transfer (Assistive device): Other (Comment)(slide board) Mod  assist, with bed pad under board Locomotion  Gait Ambulation: No Gait Gait: No Stairs / Additional Locomotion Stairs: No Wheelchair Mobility Wheelchair Mobility: Yes Wheelchair Assistance: Chartered loss adjuster: Both upper extremities Wheelchair Parts Management: Needs assistance Distance: 150  Trunk/Postural Assessment  Cervical Assessment Cervical Assessment: Within Functional Limits Thoracic Assessment Thoracic Assessment: Within Functional Limits Lumbar Assessment Lumbar Assessment: Within Functional Limits Postural Control Postural Control: Within Functional Limits  Balance Balance Balance Assessed: Yes Static Sitting Balance Static Sitting - Level of Assistance: 5: Stand by assistance Dynamic Sitting Balance Dynamic Sitting - Level of Assistance: 5: Stand by assistance Static Standing Balance Static Standing - Level of Assistance: Not tested (comment) Dynamic Standing Balance Dynamic Standing - Level of Assistance: Not tested (comment) Extremity Assessment   RLE Assessment RLE Assessment: Exceptions to [facility] Passive Range of Motion (PROM) Comments: lacks approx 30 degrees of extension in sitting Active Range of Motion (AROM) Comments: lacks 40 degrees of extension in sitting General Strength Comments: hip flexion, knee extension 2-/5 grossly in sitting EOB LLE Assessment LLE Assessment: Exceptions to WFL(pt with numerous abrasions due to fall to ground at home when attempting car tr) General Strength Comments: grossly in sitting: hip flexion 4/5, knee ext 4+/5, ankle DF 2-/5    Refer to Care Plan for Long Term Goals  Recommendations for other services: Neuropsych for depression, non-compliance per chart  Discharge Criteria: Patient will be discharged from PT if patient refuses treatment 3 consecutive times without medical reason, if treatment goals not met, if there is a change in medical status, if patient makes no progress towards  goals or if patient is discharged from hospital.  The above assessment, treatment plan, treatment alternatives and goals were discussed and mutually agreed upon: by patient  Darriona Dehaas 12/04/2018, 5:09 PM

## 2018-12-04 NOTE — Telephone Encounter (Signed)
-----   Message from Ulyses Amor, [place] sent at 12/03/2018 12:07 PM EDT -----  S/P right BKA needs f/u in 4 weeks with Dr. [name]

## 2018-12-05 ENCOUNTER — Inpatient Hospital Stay (HOSPITAL_COMMUNITY): Payer: Medicare Other

## 2018-12-05 ENCOUNTER — Inpatient Hospital Stay (HOSPITAL_COMMUNITY): Payer: Medicare Other | Admitting: Physical Therapy

## 2018-12-05 ENCOUNTER — Inpatient Hospital Stay (HOSPITAL_COMMUNITY): Payer: Medicare Other | Admitting: Occupational Therapy

## 2018-12-05 DIAGNOSIS — E1142 Type 2 diabetes mellitus with diabetic polyneuropathy: Secondary | ICD-10-CM

## 2018-12-05 LAB — GLUCOSE, CAPILLARY
Glucose-Capillary: 82 mg/dL (ref 70–99)
Glucose-Capillary: 46 mg/dL — ABNORMAL LOW (ref 70–99)
Glucose-Capillary: 52 mg/dL — ABNORMAL LOW (ref 70–99)
Glucose-Capillary: 52 mg/dL — ABNORMAL LOW (ref 70–99)
Glucose-Capillary: 51 mg/dL — ABNORMAL LOW (ref 70–99)
Glucose-Capillary: 65 mg/dL — ABNORMAL LOW (ref 70–99)
Glucose-Capillary: 117 mg/dL — ABNORMAL HIGH (ref 70–99)
Glucose-Capillary: 106 mg/dL — ABNORMAL HIGH (ref 70–99)

## 2018-12-05 MED ORDER — CHLORHEXIDINE GLUCONATE CLOTH 2 % EX PADS
6.0000 | MEDICATED_PAD | Freq: Every day | CUTANEOUS | Status: DC
  Administered 2018-12-11 – 2018-12-12 (×2): 6 via TOPICAL

## 2018-12-05 NOTE — Progress Notes (Signed)
Physical Therapy Session Note  Patient Details  Name: [name] MRN: [id number] Date of Birth: 1951/07/15  Today's Date: 12/05/2018 PT Individual Time: 1705-1735 PT Individual Time Calculation (min): 30 min   Short Term Goals: Week 1:  PT Short Term Goal 1 (Week 1): pt will move supine> sit wiht supervision PT Short Term Goal 2 (Week 1): pt will transfer bed>< w/c wiht min assist PT Short Term Goal 3 (Week 1): pt will propel in home setting x 25' with supervision without encountering obstacles PT Short Term Goal 4 (Week 1): pt will propel w/c up/down ramp with mod assist PT Short Term Goal 5 (Week 1): pt will initiate standng in parallel bars   Skilled Therapeutic Interventions/Progress Updates:   Pt received supine in bed and agreeable to PT. Supine>sit transfer with min cues for safety and decreased use of bed rails as tolerated. SB transfer to [name] with min assist from PT and min-mod cues for improved UE placement to improve safety and reduce sheer forces on ischial tuberosities. WC mobility x 12f with supervision assist from PT and min cues for increased use of momentum to prevent excessive fatigue. [facility]<>[facility] with sustained standing tolerance 2 x 25sec. Pt noted to have mild UE fatigue requiring rest breaks. Patient returned to room and performed SB transfer back to bed with min assist as listed above. Pt left sitting EOB with call bell in reach and all needs met.         Therapy Documentation Precautions:  Precautions Precautions: Fall Restrictions Weight Bearing Restrictions: Yes RLE Weight Bearing: Non weight bearing    Vital Signs: Therapy Vitals Temp: 98.1 F (36.7 C) Temp Source: Oral Pulse Rate: 70 Resp: 20 BP: (!) 129/59 Patient Position (if appropriate): Lying Oxygen Therapy SpO2: 100 % O2 Device: Room Air Pain:   4/10 LLE. Surgical. RN aware.    Therapy/Group: Individual Therapy  ALorie Phenix4/24/2020, 5:38 PM

## 2018-12-05 NOTE — Progress Notes (Signed)
Hypoglycemic Event  CBG: 51  Treatment: 4 oz juice/soda  Symptoms: None  Follow-up CBG: Time:1436 CBG Result:65  Possible Reasons for Event: Unknown   Comments/MD notified:[name], Patient ate lunch and immediately felt better after initial symptoms at 1147. No symptoms persisted past lunch.     [name]

## 2018-12-05 NOTE — Progress Notes (Addendum)
Physical Therapy Session Note  Patient Details  Name: [name] MRN: [id number] Date of Birth: Sep 10, 1950  Today's Date: 12/05/2018 PT Individual Time: 1130-1200 and 8916-9450 PT Individual Time Calculation (min): 30 min and 30 min   Short Term Goals: Week 1:  PT Short Term Goal 1 (Week 1): pt will move supine> sit wiht supervision PT Short Term Goal 2 (Week 1): pt will transfer bed>< w/c wiht min assist PT Short Term Goal 3 (Week 1): pt will propel in home setting x 25' with supervision without encountering obstacles PT Short Term Goal 4 (Week 1): pt will propel w/c up/down ramp with mod assist PT Short Term Goal 5 (Week 1): pt will initiate standng in parallel bars   Skilled Therapeutic Interventions/Progress Updates:     Session 1: Patient in w/c upon PT arrival. Patient alert and agreeable to PT session. NT came in at beginning of session to take the patient's blood glucose, and his blood sugar was 46 and patient reported that he has been feeling "off" and a little confused this morning. RN was notified and brought the patient apple juice and gram crackers. While the patient ate, PT performed ACE wrapping of residual limb and education on wrapping technique, edema control, shaping, phantom pain, and skin integrity. Patient then performed w/c mobility in the room 10 feet and a 180 degrees turn and was educated on donning and doffing L leg rest and R limb pad and use of breaks. Patient in w/c to eat set up to eat lunch at end of session with breaks locked, seat belt alarm set, and all needs within reach.   Session 2: Patient in bed upon PT arrival. Patient alert and agreeable to PT session. Patient's glucose remained low this afternoon, 51, and patient reported feeling fatigued, limited session to bed level exercises this afternoon due to low blood sugar and fatigue.   Therapeutic Activity: Bed Mobility: Patient performed supine to/from sit with supervision with HOB elevated and use  of bed rail. Provided verbal cues for using UE to push to sitting.  Therapeutic Exercise: Patient performed the following exercises with verbal and tactile cues for proper technique. -R LAQ in sitting x10 -R quat sets in supine with 5 second holds x10 -R SLR x5 with min A AAROM -B glut sets with 5 second holds x10 -Bridging x5 for 2 sets with min A from therapist for L LE elevation  Patient in bed at end of session with breaks locked, bed alarm set, and all needs within reach.   Therapy Documentation Precautions:  Precautions Precautions: Fall Restrictions Weight Bearing Restrictions: Yes RLE Weight Bearing: Non weight bearing General:   Vital Signs: Therapy Vitals Temp: 98.1 F (36.7 C) Temp Source: Oral Pulse Rate: 70 Resp: 20 BP: (!) 129/59 Patient Position (if appropriate): Lying Oxygen Therapy SpO2: 100 % O2 Device: Room Air Pain:  Session 1: Patient reported 8/10 constant, aching pain in his R residual limb; provided repositioning and distraction for pain interventions during session Session 2: Patient reported 5/10 constant burning surgical and phantom pain in his R residual limb; provided repositioning and distraction for pain interventions during session.   Therapy/Group: Individual Therapy  Doreene Burke, PT, DPT 12/05/2018, 3:55 PM

## 2018-12-05 NOTE — Significant Event (Signed)
Hypoglycemic Event  CBG: 52  Treatment: 4 oz juice/soda  Symptoms: None  Follow-up CBG: Time:1332 CBG Result:51  Possible Reasons for Event: Unknown  Comments/MD notified:Dan ANguilli, PA.     Barrett Shell

## 2018-12-05 NOTE — Progress Notes (Addendum)
[facility] Progress Note   Subjective: Seen in room - getting ready for PT. C/o R stump pain. No CP/dyspnea.   Objective Vitals:   12/04/18 1700 12/04/18 1720 12/04/18 1927 12/05/18 0545  BP: 128/65 139/62 128/62 (!) 149/71  Pulse: 88 87 94 77  Resp: 17 18 16  (!) 22  Temp:  97.9 F (36.6 C) (!) 95 F (35 C) 98.9 F (37.2 C)  TempSrc:  Oral  Oral  SpO2:  95% 94% 97%  Weight:  117.4 kg    Height:       Physical Exam General: Well appearing man, NAD Heart: RRR; no murmur Lungs: CTAB Abdomen: soft, slightly distended Extremities: R BKA bandaged; trace LLE edema - scattered scabbing to L leg (s/p prior fall) Dialysis Access: LUE AVF + thrill  Additional Objective Labs: Basic Metabolic Panel: Recent Labs  Lab 12/03/18 0325 12/03/18 1944 12/04/18 1103  NA 135 136 137  K 3.8 3.9 4.5  CL 95* 98 99  CO2 25 26 24   GLUCOSE 216* 169* 91  BUN 28* 37* 45*  CREATININE 6.26* 7.87*  7.77* 9.08*  CALCIUM 8.4* 8.7* 8.9  PHOS  --  4.7* 5.6*   Liver Function Tests: Recent Labs  Lab 12/03/18 1944 12/04/18 1103  ALBUMIN 1.9* 2.1*   CBC: Recent Labs  Lab 12/01/18 1414  12/02/18 0813 12/03/18 0325 12/03/18 1944  WBC 14.8*  --  11.2* 7.2 7.0  NEUTROABS 12.0*  --   --   --   --   HGB 8.4*   < > 8.7* 8.0* 8.0*  HCT 27.6*   < > 27.6* 24.4* 24.8*  MCV 98.6  --  95.2 93.1 95.8  PLT 333  --  311 296 315   < > = values in this interval not displayed.   CBG: Recent Labs  Lab 12/04/18 1935 12/04/18 2121 12/05/18 0557 12/05/18 1147 12/05/18 1205  GLUCAP 149* 158* 82 46* 52*   Medications:  . amitriptyline  10 mg Oral QHS  . atorvastatin  80 mg Oral QHS  . calcium acetate  2,668 mg Oral TID WC  . Chlorhexidine Gluconate Cloth  6 each Topical Q0600  . cinacalcet  30 mg Oral QPM  . clopidogrel  75 mg Oral Daily  . darbepoetin (ARANESP) injection - DIALYSIS  25 mcg Intravenous Q Thu-HD  . docusate sodium  100 mg Oral Daily  . feeding supplement  (PRO-STAT SUGAR FREE 64)  30 mL Oral BID  . heparin  5,000 Units Subcutaneous Q8H  . insulin aspart  0-15 Units Subcutaneous TID WC  . insulin glargine  85 Units Subcutaneous QHS  . levothyroxine  125 mcg Oral Q0600  . lidocaine  1 patch Transdermal Q24H  . Melatonin  9 mg Oral QHS  . pantoprazole  40 mg Oral Daily  . pregabalin  150 mg Oral TID  . risperiDONE  0.25 mg Oral Daily    Dialysis Orders: Center:[facility]. EDW 119.5 kg; Time: 4.5 hr; 2K/2.5Ca BFR 400, DFR 600Temp 35C Primary nephrologist: Dr. Lowanda Foster  Heparin 2000 unit bolus and 1000 units/hr Epogen 1000 units 3x week Hectorol 0.57mcg IV 3x week Venofer 100 mg IV 3x week  Recent labs 11/25/2018: Hgb 10.0, Ca 9.1, Phos 4.9, Corr Ca 9.2, K 5.1  Assessment/Plan: 1. Ischemic R foot: S/p emergent amputation, now in inpatient rehab. On Vanc/Zosyn. 2. ESRD:Continue HD per usual TTS schedule - holding heparin x 1 week s/p surgery. 3. Hypertension/volume:BP high with some edema, ^  UF goal with next HD. Will reduce EDW on d/c. 4. Anemia:Hgb 8, Aranesp 31mcg given 4/23 - likely needs ^ dose. 5. Metabolic bone disease: Corr Ca high, VDRA held and using 2Ca bath with dialysis. Phos slightly high. Continue phoslo binder, sensipar for now. 6. Nutrition:Albumin very low (2.1) - continue protein supplements. 7. Diabetes mellitus with neuropathy: Management per primary. On lyrica.  8. COPD: No wheezing/SOB at present. Management per primary.   Veneta Penton, PA-C 12/05/2018, 12:29 PM  [facility] Pager: [phone]  I have seen and examined this patient and agree with plan and assessment in the above note with renal recommendations/intervention highlighted.  Broadus John A Chardae Mulkern,MD 12/05/2018 1:56 PM

## 2018-12-05 NOTE — Significant Event (Signed)
Hypoglycemic Event  CBG: 46  Treatment: 8 oz juice/soda  Symptoms: Shaky  Follow-up CBG: Time:1205 CBG Result:52  Possible Reasons for Event: Change in activity  Comments/MD notified:Dan [place], Shakeel Disney H

## 2018-12-05 NOTE — Progress Notes (Signed)
[name] PHYSICAL MEDICINE & REHABILITATION PROGRESS NOTE  Subjective/Complaints: Patient seen sitting up in bed this morning.  He states he slept better overnight.  He states he had a good first day of therapies yesterday.  He appears significantly more comfortable today.  He was seen by nephrology yesterday, notes reviewed.  ROS: Denies CP, shortness of breath, nausea, vomiting, diarrhea.  Objective: Vital Signs: Blood pressure (!) 149/71, pulse 77, temperature 98.9 F (37.2 C), temperature source Oral, resp. rate (!) 22, height 6\' 4"  (1.93 m), weight 117.4 kg, SpO2 97 %. No results found. Recent Labs    12/03/18 0325 12/03/18 1944  WBC 7.2 7.0  HGB 8.0* 8.0*  HCT 24.4* 24.8*  PLT 296 315   Recent Labs    12/03/18 1944 12/04/18 1103  NA 136 137  K 3.9 4.5  CL 98 99  CO2 26 24  GLUCOSE 169* 91  BUN 37* 45*  CREATININE 7.87*  7.77* 9.08*  CALCIUM 8.7* 8.9    Physical Exam: BP (!) 149/71 (BP Location: Right Arm)   Pulse 77   Temp 98.9 F (37.2 C) (Oral)   Resp (!) 22   Ht 6\' 4"  (1.93 m)   Wt 117.4 kg   SpO2 97%   BMI 31.50 kg/m  Constitutional: No distress . Vital signs reviewed. Obese. HENT: Normocephalic.  Atraumatic. Eyes: EOMI.  No discharge. Cardiovascular: No JVD. Respiratory: Normal effort. GI: Non-distended. Musc: Right BKA with edema and tenderness, improving Neurological: He is alert.  Patient is alert and follows full commands.  Motor: Bilateral upper extremities: 5/5 proximal distal, left stronger than right Left lower extremity: Hip flexion, knee extension 5/5, ankle dorsiflexion 4-/5 Right lower extremity: 4-/5 hip flexion, knee extension (pain inhibition) Skin: Amputation site with staples C/D/I Psychiatric: He has a normal mood and affect. His behavior is normal.   Assessment/Plan: 1. Functional deficits secondary to right BKA which require 3+ hours per day of interdisciplinary therapy in a comprehensive inpatient rehab  setting.  Physiatrist is providing close team supervision and 24 hour management of active medical problems listed below.  Physiatrist and rehab team continue to assess barriers to discharge/monitor patient progress toward functional and medical goals  Care Tool:  Bathing  Bathing activity did not occur: Refused(refused to attempt LB bathing, unable to lift buttocks off w/c with max assist) Body parts bathed by patient: Right arm, Left arm, Chest, Abdomen, Front perineal area         Bathing assist       Upper Body Dressing/Undressing Upper body dressing Upper body dressing/undressing activity did not occur (including orthotics): N/A What is the patient wearing?: Pull over shirt    Upper body assist Assist Level: Supervision/Verbal cueing    Lower Body Dressing/Undressing Lower body dressing    Lower body dressing activity did not occur: N/A What is the patient wearing?: Underwear/pull up     Lower body assist Assist for lower body dressing: Moderate Assistance - Patient 50 - 74%     Toileting Toileting Toileting Activity did not occur Landscape architect and hygiene only): N/A (no void or bm)  Toileting assist Assist for toileting: Maximal Assistance - Patient 25 - 49%     Transfers Chair/bed transfer  Transfers assist  Chair/bed transfer activity did not occur: N/A  Chair/bed transfer assist level: Moderate Assistance - Patient 50 - 74%     Locomotion Ambulation   Ambulation assist   Ambulation activity did not occur: Safety/medical concerns(pt generalized weakness)  Walk 10 feet activity   Assist  Walk 10 feet activity did not occur: N/A        Walk 50 feet activity   Assist Walk 50 feet with 2 turns activity did not occur: N/A         Walk 150 feet activity   Assist Walk 150 feet activity did not occur: N/A         Walk 10 feet on uneven surface  activity   Assist Walk 10 feet on uneven surfaces activity did not  occur: N/A         Wheelchair     Assist Will patient use wheelchair at discharge?: Yes Type of Wheelchair: Manual Wheelchair activity did not occur: N/A  Wheelchair assist level: Supervision/Verbal cueing Max wheelchair distance: 150    Wheelchair 50 feet with 2 turns activity    Assist    Wheelchair 50 feet with 2 turns activity did not occur: N/A   Assist Level: Supervision/Verbal cueing   Wheelchair 150 feet activity     Assist Wheelchair 150 feet activity did not occur: N/A   Assist Level: Supervision/Verbal cueing      Medical Problem List and Plan: 1.   Decreased functional mobility secondary to right below-knee amputation 12/01/2018.[employer] consulted for stump shrinker  Continue CIR 2.  Antithrombotics: -DVT/anticoagulation:  Subcutaneous heparin             -antiplatelet therapy:  Plavix 75 mg daily 3. Pain Management:   Phantom limb pain improving  Lyrica 150 mg 3 times a day, discussed with pharmacy, appears to have been on medication for >/ 2 years, will not make any adjustments at this time  Elavil 10 started on 4/23  Lidoderm patch ordered on 4/23  Oxycodone as needed             Encourage desensitization techniques, mirror therapy 4. Mood:             -antipsychotic agents: Risperdal 0.25 mg daily 5. Neuropsych: This patient is capable of making decisions on his own behalf. 6. Skin/Wound Care:  Routine skin checks 7. Fluids/Electrolytes/Nutrition:  Routine I/Os.   8. Acute on chronic anemia.   Hb 8.0 on 4/22  Labs with HD  Cont to monitor 9. End-stage renal disease. Continue hemodialysis 10. Diabetes mellitus and peripheral neuropathy. HbA1c 11.2 on 4/16.   Lantus insulin 85 units daily at bedtime.  Diabetic teaching.    Level on 4/24, monitor for trend  Monitor with increased mobility 11. Hypertension.    Slightly labile on 4/24  Monitor with increased mobility 12. COPD with remote tobacco abuse. Continue nebulizer  as needed 13. Hyperlipidemia. Lipitor 14. Hypothyroidism. Synthroid 15. GERD. Protonix 16. Constipation. Laxative assistance 17.  Sleep disturbance  Melatonin 9 mg daily at bedtime  Trazodone d/ced  Elavil started on 4/23  Improving  LOS: 2 days A FACE TO FACE EVALUATION WAS PERFORMED  Delisa Finck Lorie Phenix 12/05/2018, 10:00 AM

## 2018-12-05 NOTE — Progress Notes (Signed)
Occupational Therapy Session Note  Patient Details  Name: [name] MRN: [id number] Date of Birth: 1951/06/13  Today's Date: 12/05/2018 OT Individual Time: 8110-3159 OT Individual Time Calculation (min): 55 min  and Today's Date: 12/05/2018 OT Missed Time: 20 Minutes Missed Time Reason: Patient fatigue   Short Term Goals: Week 1:  OT Short Term Goal 1 (Week 1): Pt will complete sit > stand with max assist in preparation for self-care tasks OT Short Term Goal 2 (Week 1): Pt will complete toilet transfer with mod assist  OT Short Term Goal 3 (Week 1): Pt will complete bathing with mod assist at sit > stand level OT Short Term Goal 4 (Week 1): Pt will complete LB dressing with mod assist  Skilled Therapeutic Interventions/Progress Updates:    Treatment session with focus on LB dressing and functional transfers.  Pt received supine in bed reporting need to toilet.  Donned boxers in semi-reclined position in bed with increased time when reaching to thread RLE/residual limb.  Pt completed bed mobility with min assist and placed slide board appropriately with min question cues to improve positioning.  Slide board transfer bed > wide drop arm BSC with mod assist and lateral leans to remove slide board.  Mod cues for sequencing and head/hips relationship during transfer.  Pt with out successful BM, requested suppository.  RN arrived and inserted suppository while pt on BSC.  Lateral leans to pull underwear back over hips with pt requiring assistance from therapist to fully adjust underwear.  Completed slide board transfer mod assist back to bed with mod cues for head/hips relationship.  Pt reports fatigue and wanting to rest until suppository takes effect.  Returned to supine in bed and left semi-reclined with all needs in reach.  Therapy Documentation Precautions:  Precautions Precautions: Fall Restrictions Weight Bearing Restrictions: Yes RLE Weight Bearing: Non weight  bearing General: General OT Amount of Missed Time: 20 Minutes Pain:  Pt with c/o pain in residual limb 5/10.  Premedicated.   Therapy/Group: Individual Therapy  Simonne Come 12/05/2018, 9:49 AM

## 2018-12-05 NOTE — Significant Event (Signed)
Hypoglycemic Event  CBG: 52  Treatment: 4 oz juice/soda  Symptoms: None  Follow-up CBG: Time:1252 CBG Result:52  Possible Reasons for Event: Change in activity  Comments/MD notified:Dan [place], Dangelo Guzzetta H

## 2018-12-05 NOTE — Progress Notes (Signed)
Occupational Therapy Session Note  Patient Details  Name: [name] MRN: [id number] Date of Birth: Sep 04, 1950  Today's Date: 12/05/2018 OT Individual Time: 1030-1100 OT Individual Time Calculation (min): 30 min    Short Term Goals: Week 1:  OT Short Term Goal 1 (Week 1): Pt will complete sit > stand with max assist in preparation for self-care tasks OT Short Term Goal 2 (Week 1): Pt will complete toilet transfer with mod assist  OT Short Term Goal 3 (Week 1): Pt will complete bathing with mod assist at sit > stand level OT Short Term Goal 4 (Week 1): Pt will complete LB dressing with mod assist  Skilled Therapeutic Interventions/Progress Updates:    Pt received supine with c/o pain in R residual limb as described below. Pt reported needing to use BSC. Pt completed bed mobility, requiring mod A to transition to EOB. Pt completed slideboard transfer to bari [facility] with min A. Min cueing for UE positioning. Pt voided b/b and was able to complete peri hygiene with min cueing for positioning. Pt requiring mod A overall to pull pants up using lateral leans, moderate cueing. Pt used slideboard to transfer to w/c with min A. Pt left sitting up in w/c with all needs met. Edu provided re diabetes management and intake of sugar within OT scope, pt referred to speak more with RN or dietician.   Therapy Documentation Precautions:  Precautions Precautions: Fall Restrictions Weight Bearing Restrictions: Yes RLE Weight Bearing: Non weight bearing   Pain: Pain Assessment Pain Scale: 0-10 Pain Score: 8  Pain Type: Acute pain Pain Location: Leg Pain Orientation: Right Pain Descriptors / Indicators: Aching Pain Frequency: Constant Pain Onset: On-going Pain Intervention(s): Medication (See eMAR)   Therapy/Group: Individual Therapy  Curtis Sites 12/05/2018, 11:10 AM

## 2018-12-06 ENCOUNTER — Inpatient Hospital Stay (HOSPITAL_COMMUNITY): Payer: Medicare Other

## 2018-12-06 LAB — CULTURE, BLOOD (ROUTINE X 2)
Culture: NO GROWTH
Culture: NO GROWTH
Special Requests: ADEQUATE

## 2018-12-06 LAB — GLUCOSE, CAPILLARY
Glucose-Capillary: 73 mg/dL (ref 70–99)
Glucose-Capillary: 84 mg/dL (ref 70–99)
Glucose-Capillary: 82 mg/dL (ref 70–99)
Glucose-Capillary: 134 mg/dL — ABNORMAL HIGH (ref 70–99)

## 2018-12-06 LAB — CBC
HCT: 25.1 % — ABNORMAL LOW (ref 39.0–52.0)
Hemoglobin: 7.9 g/dL — ABNORMAL LOW (ref 13.0–17.0)
MCH: 30.4 pg (ref 26.0–34.0)
MCHC: 31.5 g/dL (ref 30.0–36.0)
MCV: 96.5 fL (ref 80.0–100.0)
Platelets: 347 10*3/uL (ref 150–400)
RBC: 2.6 MIL/uL — ABNORMAL LOW (ref 4.22–5.81)
RDW: 16.2 % — ABNORMAL HIGH (ref 11.5–15.5)
WBC: 9.1 10*3/uL (ref 4.0–10.5)
nRBC: 0 % (ref 0.0–0.2)

## 2018-12-06 LAB — RENAL FUNCTION PANEL
Albumin: 2 g/dL — ABNORMAL LOW (ref 3.5–5.0)
Anion gap: 16 — ABNORMAL HIGH (ref 5–15)
BUN: 61 mg/dL — ABNORMAL HIGH (ref 8–23)
CO2: 24 mmol/L (ref 22–32)
Calcium: 8.5 mg/dL — ABNORMAL LOW (ref 8.9–10.3)
Chloride: 92 mmol/L — ABNORMAL LOW (ref 98–111)
Creatinine, Ser: 9.74 mg/dL — ABNORMAL HIGH (ref 0.61–1.24)
GFR calc Af Amer: 6 mL/min — ABNORMAL LOW (ref >60)
GFR calc non Af Amer: 5 mL/min — ABNORMAL LOW (ref >60)
Glucose, Bld: 107 mg/dL — ABNORMAL HIGH (ref 70–99)
Phosphorus: 6.2 mg/dL — ABNORMAL HIGH (ref 2.5–4.6)
Potassium: 4.5 mmol/L (ref 3.5–5.1)
Sodium: 132 mmol/L — ABNORMAL LOW (ref 135–145)

## 2018-12-06 MED ORDER — INSULIN GLARGINE 100 UNIT/ML ~~LOC~~ SOLN
80.0000 [IU] | Freq: Every day | SUBCUTANEOUS | Status: DC
  Administered 2018-12-06 – 2018-12-07 (×2): 80 [IU] via SUBCUTANEOUS
  Filled 2018-12-06 (×3): qty 0.8

## 2018-12-06 NOTE — Plan of Care (Signed)
  Problem: Consults Goal: RH LIMB LOSS PATIENT EDUCATION Description Description: See Patient Education module for eduction specifics. Outcome: Progressing Goal: Skin Care Protocol Initiated - if Braden Score 18 or less Description If consults are not indicated, leave blank or document N/A Outcome: Progressing Goal: Diabetes Guidelines if Diabetic/Glucose > 140 Description If diabetic or lab glucose is > 140 mg/dl - Initiate Diabetes/Hyperglycemia Guidelines & Document Interventions  Outcome: Progressing   Problem: RH BOWEL ELIMINATION Goal: RH STG MANAGE BOWEL WITH ASSISTANCE Description STG Manage Bowel with min Assistance.  Outcome: Progressing Goal: RH STG MANAGE BOWEL W/MEDICATION W/ASSISTANCE Description STG Manage Bowel with Medication with Assistance. min  Outcome: Progressing   Problem: RH SKIN INTEGRITY Goal: RH STG SKIN FREE OF INFECTION/BREAKDOWN Description Free of breakdown and infectiom  Outcome: Progressing Goal: RH STG MAINTAIN SKIN INTEGRITY WITH ASSISTANCE Description STG Maintain Skin Integrity With min Assistance.  Outcome: Progressing   Problem: RH SAFETY Goal: RH STG ADHERE TO SAFETY PRECAUTIONS W/ASSISTANCE/DEVICE Description STG Adhere to Safety Precautions With min Assistance/Device.  Outcome: Progressing Goal: RH STG DECREASED RISK OF FALL WITH ASSISTANCE Description STG Decreased Risk of Fall With min Assistance.  Outcome: Progressing   Problem: RH PAIN MANAGEMENT Goal: RH STG PAIN MANAGED AT OR BELOW PT'S PAIN GOAL Description Pain scale <4/10  Outcome: Progressing   Problem: RH KNOWLEDGE DEFICIT LIMB LOSS Goal: RH STG INCREASE KNOWLEDGE OF SELF CARE AFTER LIMB LOSS Description Patient will be able to verbalize care of residual limb including incision care and pain management with cues/handouts  Outcome: Progressing

## 2018-12-06 NOTE — Progress Notes (Signed)
Physical Therapy Session Note  Patient Details  Name: [name] MRN: [id number] Date of Birth: 07/29/1951  Today's Date: 12/06/2018 PT Individual Time: 1300-1415 PT Individual Time Calculation (min): 75 min   Short Term Goals: Week 1:  PT Short Term Goal 1 (Week 1): pt will move supine> sit wiht supervision PT Short Term Goal 2 (Week 1): pt will transfer bed>< w/c wiht min assist PT Short Term Goal 3 (Week 1): pt will propel in home setting x 25' with supervision without encountering obstacles PT Short Term Goal 4 (Week 1): pt will propel w/c up/down ramp with mod assist PT Short Term Goal 5 (Week 1): pt will initiate standng in parallel bars   Skilled Therapeutic Interventions/Progress Updates:     Patient in bed upon PT arrival. Patient alert and agreeable to PT session.  Therapeutic Activity: Bed Mobility: Patient performed supine to/from sit with supervision with bed flat and without use of bed rail. Provided verbal cues for pushing through UE to sit up and protect residual limb during mobility. Transfers: Patient attempted sit to/from stand x1 using a RW with max A coming halfway to standing before returning to sitting. Provided verbal cues for hand placement and demonstration of proper technique. Will continue to progress transfers with RW as appropriate. Performed slide board transfers bed<>w/c with close supervision-CGA for safety. Patient able to place board correctly independently during transfers. Performed car transfer with car set to 30" to simulate a small SUV performing a squat pivot with use of the stable door frame to pull up and pushing up from the w/c and mod A for physical support. Patient reports that he will most likely go home in his SUV.  Wheelchair Mobility:  Patient propelled wheelchair 120 feet with supervision. Provided verbal cues for avoiding obstacles. Patient managed w/c parts with min A throughout session with cues to lock and unlock breaks x2.    Therapeutic Exercise: Patient performed 2 sets the following exercises with verbal and tactile cues for proper technique. Provided handout for HEP in [place].   Exercises  Supine Quad Set 5 sec hold - 10 reps - 3 sets - 2x daily - 7x weekly  Supine Gluteal Sets 5 sec hold- 10 reps - 3 sets - 2x daily - 7x weekly  Supine Active Straight Leg Raise - 10 reps - 3 sets - 2x daily - 7x weekly  Supine Isometric Hip Adduction with Towel Roll 5 sec hold - 10 reps - 3 sets - 2x daily - 7x weekly  Supine Single Leg Bridge with Sound Leg-requires min A - 10 reps - 3 sets - 2x daily - 7x weekly  Sidelying Hip Abduction - 10 reps - 3 sets - 2x daily - 7x weekly  Seated Knee Extension - 10 reps - 3 sets - 2x daily - 7x weekly   Patient in bed at end of session with breaks locked, bed alarm set, and all needs within reach.    Therapy Documentation Precautions:  Precautions Precautions: Fall Restrictions Weight Bearing Restrictions: Yes RLE Weight Bearing: Non weight bearing Pain: Pain Assessment Pain Scale: 0-10 Pain Score: 4/10 Pain Type: Surgical pain Pain Location: Leg Pain Orientation: Right Pain Descriptors / Indicators: Aching Pain Frequency: Intermittent Pain Onset: On-going Pain Intervention(s): distraction; reposition RN provided pain medicine during session.    Therapy/Group: Individual Therapy  Cristle Jared L Zebulen Simonis PT, DPT  12/06/2018, 4:16 PM

## 2018-12-06 NOTE — Progress Notes (Signed)
Occupational Therapy Session Note  Patient Details  Name: [name] MRN: [id number] Date of Birth: 10-04-1950  Today's Date: 12/06/2018 OT Individual Time: 1314-3888 OT Individual Time Calculation (min): 73 min    Short Term Goals: Week 1:  OT Short Term Goal 1 (Week 1): Pt will complete sit > stand with max assist in preparation for self-care tasks OT Short Term Goal 2 (Week 1): Pt will complete toilet transfer with mod assist  OT Short Term Goal 3 (Week 1): Pt will complete bathing with mod assist at sit > stand level OT Short Term Goal 4 (Week 1): Pt will complete LB dressing with mod assist  Skilled Therapeutic Interventions/Progress Updates:    1:1. Pt received in w/c. Pt c/o pain and RN delivers medication. Pt declines bathing/dressing/toileting at this time. Pt completes lateral scoot transfers throughotu session with MIN A and question cues for checking board placement. Pt completes core and arm exercises at EOM sits ups with wedge 3x10 and trunk twists with 4lb ball. Pt completes ball tosses EOB with 1# wrist weights overhead pass, bounce pass and chest pass for BUE strengthtening required for BADLs. Pt propels w/c back to room with increased time and VC for momentum use to decrease fatigue. Exited session with pt seated in bed, exit alarm on and call ligtin reach  Therapy Documentation Precautions:  Precautions Precautions: Fall Restrictions Weight Bearing Restrictions: Yes RLE Weight Bearing: Non weight bearing General:   Vital Signs:   Pain: Pain Assessment Pain Scale: 0-10 Pain Score: 5  Pain Type: Surgical pain Pain Location: Leg Pain Orientation: Right Pain Descriptors / Indicators: Aching;Burning Pain Onset: Gradual Pain Intervention(s): Medication (See eMAR) ADL:   Vision   Perception    Praxis   Exercises:   Other Treatments:     Therapy/Group: Individual Therapy  Tonny Branch 12/06/2018, 10:45 AM

## 2018-12-06 NOTE — Progress Notes (Addendum)
[facility] Progress Note   Subjective:   Patient seen in room. Feeling well, denies SOB, dyspnea, wheezing, CP, palpitations, N/V/D. Currently eating lunch. Reports he got hot after last dialysis because he was given extra blankets. Otherwise no concerns.   Objective Vitals:   12/05/18 0545 12/05/18 1420 12/05/18 1950 12/06/18 0511  BP: (!) 149/71 (!) 129/59 (!) 147/66 125/65  Pulse: 77 70 72 71  Resp: (!) 22 20 15 16   Temp: 98.9 F (37.2 C) 98.1 F (36.7 C) 97.8 F (36.6 C) 98.6 F (37 C)  TempSrc: Oral Oral Oral Oral  SpO2: 97% 100% 100% 97%  Weight:      Height:       Physical Exam General: Well developed male, alert and in NAD Heart: RRR, no murmurs, rubs or gallops Lungs: Occasional expiratory wheeze RLL. No rhonchi or rales auscultated. No accessory muscle use.  Abdomen: Soft, non-tender, non-distended. + BS, no palpable masses Extremities: Trace edema LLE, R BKA bandaged Dialysis Access: LUE AVF + thrill  Additional Objective Labs: Basic Metabolic Panel: Recent Labs  Lab 12/03/18 0325 12/03/18 1944 12/04/18 1103  NA 135 136 137  K 3.8 3.9 4.5  CL 95* 98 99  CO2 25 26 24   GLUCOSE 216* 169* 91  BUN 28* 37* 45*  CREATININE 6.26* 7.87*  7.77* 9.08*  CALCIUM 8.4* 8.7* 8.9  PHOS  --  4.7* 5.6*   Liver Function Tests: Recent Labs  Lab 12/03/18 1944 12/04/18 1103  ALBUMIN 1.9* 2.1*   No results for input(s): LIPASE, AMYLASE in the last 168 hours. CBC: Recent Labs  Lab 12/01/18 1414  12/02/18 0813 12/03/18 0325 12/03/18 1944  WBC 14.8*  --  11.2* 7.2 7.0  NEUTROABS 12.0*  --   --   --   --   HGB 8.4*   < > 8.7* 8.0* 8.0*  HCT 27.6*   < > 27.6* 24.4* 24.8*  MCV 98.6  --  95.2 93.1 95.8  PLT 333  --  311 296 315   < > = values in this interval not displayed.   Blood Culture    Component Value Date/Time   SDES BLOOD RIGHT HAND 12/01/2018 1509   SPECREQUEST  12/01/2018 1509    BOTTLES DRAWN AEROBIC AND ANAEROBIC Blood Culture  results may not be optimal due to an inadequate volume of blood received in culture bottles   CULT  12/01/2018 1509    NO GROWTH 5 DAYS Performed at [facility], [place] [address], [place], [place] [address]    REPTSTATUS 12/06/2018 FINAL 12/01/2018 1509    Cardiac Enzymes: No results for input(s): CKTOTAL, CKMB, CKMBINDEX, TROPONINI in the last 168 hours. CBG: Recent Labs  Lab 12/05/18 1436 12/05/18 1634 12/05/18 2123 12/06/18 0641 12/06/18 1138  GLUCAP 65* 117* 106* 73 84   Iron Studies: No results for input(s): IRON, TIBC, TRANSFERRIN, FERRITIN in the last 72 hours. @lablastinr3 @ Studies/Results: No results found. Medications:  . amitriptyline  10 mg Oral QHS  . atorvastatin  80 mg Oral QHS  . calcium acetate  2,668 mg Oral TID WC  . Chlorhexidine Gluconate Cloth  6 each Topical Q0600  . cinacalcet  30 mg Oral QPM  . clopidogrel  75 mg Oral Daily  . darbepoetin (ARANESP) injection - DIALYSIS  25 mcg Intravenous Q Thu-HD  . docusate sodium  100 mg Oral Daily  . feeding supplement (PRO-STAT SUGAR FREE 64)  30 mL Oral BID  . heparin  5,000 Units  Subcutaneous Q8H  . insulin aspart  0-15 Units Subcutaneous TID WC  . insulin glargine  80 Units Subcutaneous QHS  . levothyroxine  125 mcg Oral Q0600  . lidocaine  1 patch Transdermal Q24H  . Melatonin  9 mg Oral QHS  . pantoprazole  40 mg Oral Daily  . pregabalin  150 mg Oral TID  . risperiDONE  0.25 mg Oral Daily    Dialysis Orders: Center:[facility]. EDW 119.5 kg; Time: 4.5 hr; 2K/2.5Ca BFR 400, DFR 600Temp 35C Primary nephrologist: Dr. Lowanda Foster  Heparin 2000 unit bolus and 1000 units/hr Epogen 1000 units 3x week Hectorol 0.91mcg IV 3x week Venofer 100 mg IV 3x week  Recent labs 11/25/2018: Hgb 10.0, Ca 9.1, Phos 4.9, Corr Ca 9.2, K 5.1  Assessment/Plan: 1. Ischemic R foot: S/p emergent amputation, now in inpatient rehab. On Vanc/Zosyn. 2. ESRD:Continue HD per usual TTS schedule -  holding heparin x 1 week s/p surgery. 3. Hypertension/volume:BP high with some edema, ^ UF goal with HD today. Will reduce EDW on d/c. 4. Anemia:Hgb 8, Aranesp 38mcg given 4/23 -trend hemoglobin, likely needs ^ dose. 5. Metabolic bone disease: Corr Ca 10.4, VDRA held and using 2Ca bath with dialysis. Phos slightly high. Continue phoslo binder, sensipar for now. 6. Nutrition:Albuminvery low (2.1) - continue protein supplements. 7. Diabetes mellitus with neuropathy: Management per primary. On lyrica.  8. COPD:No SOB at present. Management per primary. 9. Tremor: Has reported intermittent tremor, not related to dialysis. Had been present for at least several months and not worsening. Reports he plans to follow up with PCP after discharge. Monitor.   [name], PA-C 12/06/2018, 12:39 PM  [facility] Pager: [phone]  I have seen and examined this patient and agree with plan and assessment in the above note with renal recommendations/intervention highlighted.  Continue with HD qTTS Governor Rooks Ulyssa Walthour,MD 12/06/2018 2:00 PM

## 2018-12-06 NOTE — Progress Notes (Addendum)
PHYSICAL MEDICINE & REHABILITATION PROGRESS NOTE  Subjective/Complaints: Pt asking about IV removal RUE , no IV meds per RN  ROS: Denies CP, shortness of breath, nausea, vomiting, diarrhea.  Objective: Vital Signs: Blood pressure 125/65, pulse 71, temperature 98.6 F (37 C), temperature source Oral, resp. rate 16, height 6\' 4"  (1.93 m), weight 117.4 kg, SpO2 97 %. No results found. Recent Labs    12/03/18 1944  WBC 7.0  HGB 8.0*  HCT 24.8*  PLT 315   Recent Labs    12/03/18 1944 12/04/18 1103  NA 136 137  K 3.9 4.5  CL 98 99  CO2 26 24  GLUCOSE 169* 91  BUN 37* 45*  CREATININE 7.87*  7.77* 9.08*  CALCIUM 8.7* 8.9    Physical Exam: BP 125/65 (BP Location: Right Arm)   Pulse 71   Temp 98.6 F (37 C) (Oral)   Resp 16   Ht 6\' 4"  (1.93 m)   Wt 117.4 kg   SpO2 97%   BMI 31.50 kg/m  Constitutional: No distress . Vital signs reviewed. Obese. HENT: Normocephalic.  Atraumatic. Eyes: EOMI.  No discharge. Cardiovascular: No JVD. Respiratory: Normal effort. GI: Non-distended. Musc: Right BKA with edema and tenderness, improving Neurological: He is alert.  Patient is alert and follows full commands.  Motor: Bilateral upper extremities: 5/5 proximal distal, left stronger than right Left lower extremity: Hip flexion, knee extension 5/5, ankle dorsiflexion 4-/5 Right lower extremity: 4-/5 hip flexion, knee extension (pain inhibition) Sensation reduced to LT both hands, and absent below supramalleolar area on Left side Skin: Amputation site with staples C/D/I Psychiatric: He has a normal mood and affect. His behavior is normal.   Assessment/Plan: 1. Functional deficits secondary to right BKA which require 3+ hours per day of interdisciplinary therapy in a comprehensive inpatient rehab setting.  Physiatrist is providing close team supervision and 24 hour management of active medical problems listed below.  Physiatrist and rehab team continue to assess  barriers to discharge/monitor patient progress toward functional and medical goals  Care Tool:  Bathing  Bathing activity did not occur: Refused(refused to attempt LB bathing, unable to lift buttocks off w/c with max assist) Body parts bathed by patient: Right arm, Left arm, Chest, Abdomen, Front perineal area         Bathing assist       Upper Body Dressing/Undressing Upper body dressing Upper body dressing/undressing activity did not occur (including orthotics): N/A What is the patient wearing?: Pull over shirt    Upper body assist Assist Level: Supervision/Verbal cueing    Lower Body Dressing/Undressing Lower body dressing    Lower body dressing activity did not occur: N/A What is the patient wearing?: Underwear/pull up     Lower body assist Assist for lower body dressing: Moderate Assistance - Patient 50 - 74%     Toileting Toileting Toileting Activity did not occur Landscape architect and hygiene only): Safety/medical concerns  Toileting assist Assist for toileting: Maximal Assistance - Patient 25 - 49%     Transfers Chair/bed transfer  Transfers assist  Chair/bed transfer activity did not occur: N/A  Chair/bed transfer assist level: Minimal Assistance - Patient > 75%     Locomotion Ambulation   Ambulation assist   Ambulation activity did not occur: Safety/medical concerns(pt generalized weakness)          Walk 10 feet activity   Assist  Walk 10 feet activity did not occur: N/A        Walk 50  feet activity   Assist Walk 50 feet with 2 turns activity did not occur: N/A         Walk 150 feet activity   Assist Walk 150 feet activity did not occur: N/A         Walk 10 feet on uneven surface  activity   Assist Walk 10 feet on uneven surfaces activity did not occur: N/A         Wheelchair     Assist Will patient use wheelchair at discharge?: Yes Type of Wheelchair: Manual Wheelchair activity did not occur:  N/A  Wheelchair assist level: Supervision/Verbal cueing Max wheelchair distance: 130    Wheelchair 50 feet with 2 turns activity    Assist    Wheelchair 50 feet with 2 turns activity did not occur: N/A   Assist Level: Supervision/Verbal cueing   Wheelchair 150 feet activity     Assist Wheelchair 150 feet activity did not occur: N/A   Assist Level: Supervision/Verbal cueing      Medical Problem List and Plan: 1.   Decreased functional mobility secondary to right below-knee amputation 12/01/2018.[employer] consulted for stump shrinker  Continue CIR PT, OT  2.  Antithrombotics: -DVT/anticoagulation:  Subcutaneous heparin due to              -antiplatelet therapy:  Plavix 75 mg daily 3. Pain Management:   Phantom limb pain improving  Lyrica 150 mg 3 times a day, Elavil 10 started on 4/23  Lidoderm patch ordered on 4/23  Oxycodone as needed             Encourage desensitization techniques, mirror therapy 4. Mood:             -antipsychotic agents: Risperdal 0.25 mg daily 5. Neuropsych: This patient is capable of making decisions on his own behalf. 6. Skin/Wound Care:  Routine skin checks 7. Fluids/Electrolytes/Nutrition:  Routine I/Os.   8. Acute on chronic anemia.   Hb 8.0 on 4/22  Labs with HD  Cont to monitor 9. End-stage renal disease. Continue hemodialysis 10. Diabetes mellitus and peripheral neuropathy. HbA1c 11.2 on 4/16.   Lantus insulin 85 units daily at bedtime.  Diabetic teaching.     CBG (last 3)  Recent Labs    12/05/18 1634 12/05/18 2123 12/06/18 0641  GLUCAP 117* 106* 73  Controlled although am value on low side reduce lantus to 80U  Monitor with increased mobility 11. Hypertension.   Vitals:   12/05/18 1950 12/06/18 0511  BP: (!) 147/66 125/65  Pulse: 72 71  Resp: 15 16  Temp: 97.8 F (36.6 C) 98.6 F (37 C)  SpO2: 100% 97%     Monitor with increased mobility 12. COPD with remote tobacco abuse. Continue nebulizer as  needed 13. Hyperlipidemia. Lipitor 14. Hypothyroidism. Synthroid 15. GERD. Protonix 16. Constipation. Laxative assistance 17.  Sleep disturbance  Melatonin 9 mg daily at bedtime  Trazodone d/ced  Elavil started on 4/23  Improving  LOS: 3 days A FACE TO FACE EVALUATION WAS PERFORMED  Charlett Blake 12/06/2018, 8:15 AM

## 2018-12-07 LAB — GLUCOSE, CAPILLARY
Glucose-Capillary: 100 mg/dL — ABNORMAL HIGH (ref 70–99)
Glucose-Capillary: 141 mg/dL — ABNORMAL HIGH (ref 70–99)
Glucose-Capillary: 177 mg/dL — ABNORMAL HIGH (ref 70–99)
Glucose-Capillary: 165 mg/dL — ABNORMAL HIGH (ref 70–99)

## 2018-12-07 NOTE — Progress Notes (Signed)
[place] PHYSICAL MEDICINE & REHABILITATION PROGRESS NOTE  Subjective/Complaints:   ROS: Denies CP, shortness of breath, nausea, vomiting, diarrhea.  Objective: Vital Signs: Blood pressure 138/70, pulse 75, temperature 98.7 F (37.1 C), temperature source Oral, resp. rate 18, height 6\' 4"  (1.93 m), weight 118.6 kg, SpO2 98 %. No results found. Recent Labs    12/06/18 1729  WBC 9.1  HGB 7.9*  HCT 25.1*  PLT 347   Recent Labs    12/04/18 1103 12/06/18 1729  NA 137 132*  K 4.5 4.5  CL 99 92*  CO2 24 24  GLUCOSE 91 107*  BUN 45* 61*  CREATININE 9.08* 9.74*  CALCIUM 8.9 8.5*    Physical Exam: BP 138/70 (BP Location: Right Arm)   Pulse 75   Temp 98.7 F (37.1 C) (Oral)   Resp 18   Ht 6\' 4"  (1.93 m)   Wt 118.6 kg   SpO2 98%   BMI 31.83 kg/m  Constitutional: No distress . Vital signs reviewed. Obese. HENT: Normocephalic.  Atraumatic. Eyes: EOMI.  No discharge. Cardiovascular: No JVD. Respiratory: Normal effort. GI: Non-distended. Musc: Right BKA with edema and tenderness, improving Neurological: He is alert.  Patient is alert and follows full commands.  Motor: Bilateral upper extremities: 5/5 proximal distal, left stronger than right Left lower extremity: Hip flexion, knee extension 5/5, ankle dorsiflexion 4-/5 Right lower extremity: 4-/5 hip flexion, knee extension (pain inhibition) Sensation reduced to LT both hands, and absent below supramalleolar area on Left side Skin: Amputation site with staples C/D/I Psychiatric: He has a normal mood and affect. His behavior is normal.     Assessment/Plan: 1. Functional deficits secondary to right BKA which require 3+ hours per day of interdisciplinary therapy in a comprehensive inpatient rehab setting.  Physiatrist is providing close team supervision and 24 hour management of active medical problems listed below.  Physiatrist and rehab team continue to assess barriers to discharge/monitor patient progress toward  functional and medical goals  Care Tool:  Bathing  Bathing activity did not occur: Refused(refused to attempt LB bathing, unable to lift buttocks off w/c with max assist) Body parts bathed by patient: Right arm, Left arm, Chest, Abdomen, Front perineal area         Bathing assist       Upper Body Dressing/Undressing Upper body dressing Upper body dressing/undressing activity did not occur (including orthotics): N/A What is the patient wearing?: Pull over shirt    Upper body assist Assist Level: Supervision/Verbal cueing    Lower Body Dressing/Undressing Lower body dressing    Lower body dressing activity did not occur: N/A What is the patient wearing?: Underwear/pull up     Lower body assist Assist for lower body dressing: Moderate Assistance - Patient 50 - 74%     Toileting Toileting Toileting Activity did not occur Landscape architect and hygiene only): Safety/medical concerns  Toileting assist Assist for toileting: Maximal Assistance - Patient 25 - 49%     Transfers Chair/bed transfer  Transfers assist  Chair/bed transfer activity did not occur: N/A  Chair/bed transfer assist level: Contact Guard/Touching assist     Locomotion Ambulation   Ambulation assist   Ambulation activity did not occur: Safety/medical concerns(pt generalized weakness)          Walk 10 feet activity   Assist  Walk 10 feet activity did not occur: N/A        Walk 50 feet activity   Assist Walk 50 feet with 2 turns activity did not  occur: N/A         Walk 150 feet activity   Assist Walk 150 feet activity did not occur: N/A         Walk 10 feet on uneven surface  activity   Assist Walk 10 feet on uneven surfaces activity did not occur: N/A         Wheelchair     Assist Will patient use wheelchair at discharge?: Yes Type of Wheelchair: Manual Wheelchair activity did not occur: N/A  Wheelchair assist level: Supervision/Verbal cueing Max  wheelchair distance: 120'    Wheelchair 50 feet with 2 turns activity    Assist    Wheelchair 50 feet with 2 turns activity did not occur: N/A   Assist Level: Supervision/Verbal cueing   Wheelchair 150 feet activity     Assist Wheelchair 150 feet activity did not occur: N/A   Assist Level: Supervision/Verbal cueing      Medical Problem List and Plan: 1.   Decreased functional mobility secondary to right below-knee amputation 12/01/2018.[employer] consulted for stump shrinker  Continue CIR PT, OT  2.  Antithrombotics: -DVT/anticoagulation:  Subcutaneous heparin due to              -antiplatelet therapy:  Plavix 75 mg daily 3. Pain Management:   Phantom limb pain improving  Lyrica 150 mg 3 times a day, Elavil 10 started on 4/23  Lidoderm patch ordered on 4/23  Oxycodone as needed             Encourage desensitization techniques, mirror therapy 4. Mood:             -antipsychotic agents: Risperdal 0.25 mg daily 5. Neuropsych: This patient is capable of making decisions on his own behalf. 6. Skin/Wound Care:  good wound healing 7. Fluids/Electrolytes/Nutrition:  Routine I/Os.   8. Acute on chronic anemia.   Hb 8.0 on 4/22  Labs with HD  Cont to monitor 9. End-stage renal disease. Continue hemodialysis 10. Diabetes mellitus and peripheral neuropathy. HbA1c 11.2 on 4/16.   Lantus insulin 85 units daily at bedtime.  Diabetic teaching.     CBG (last 3)  Recent Labs    12/06/18 1643 12/06/18 2147 12/07/18 0611  GLUCAP 82 134* 100*  am value improved on lantus  80U  Monitor with increased mobility 11. Hypertension.   Vitals:   12/06/18 2151 12/07/18 0621  BP: 127/73 138/70  Pulse: 87 75  Resp: 15 18  Temp: 98.3 F (36.8 C) 98.7 F (37.1 C)  SpO2: 100% 98%     Monitor with increased mobility 12. COPD with remote tobacco abuse. Continue nebulizer as needed 13. Hyperlipidemia. Lipitor 14. Hypothyroidism. Synthroid 15. GERD. Protonix 16.  Constipation. Laxative assistance 17.  Sleep disturbance  Melatonin 9 mg daily at bedtime  Trazodone d/ced  Elavil started on 4/23  Improving  LOS: 4 days A FACE TO FACE EVALUATION WAS PERFORMED  Charlett Blake 12/07/2018, 7:41 AM

## 2018-12-07 NOTE — Progress Notes (Addendum)
[name] Progress Note   Subjective:   Patient seen in room. Feeling well, no new concerns. Denies issues with HD yesterday. Denies dyspnea, SOB, CP, palpitations, abdominal pain, N/V/D.   Objective Vitals:   12/06/18 2113 12/06/18 2135 12/06/18 2151 12/07/18 0621  BP: (!) 164/70 (!) 173/76 127/73 138/70  Pulse: 85 80 87 75  Resp: 20  15 18   Temp: 98 F (36.7 C)  98.3 F (36.8 C) 98.7 F (37.1 C)  TempSrc: Oral  Oral Oral  SpO2:  100% 100% 98%  Weight: 118.6 kg     Height:       Physical Exam General: Well developed male, alert and in NAD Heart: RRR, no murmurs, rubs or gallops Lungs: CTA bilaterally without wheezing, rhonchi or rales Abdomen: Soft, non-tender, non-distended. + BS, no palpable masses Extremities: No edema bilateral lower extremities, R BKA bandaged Dialysis Access: LUE AVF + thrill  Additional Objective Labs: Basic Metabolic Panel: Recent Labs  Lab 12/03/18 1944 12/04/18 1103 12/06/18 1729  NA 136 137 132*  K 3.9 4.5 4.5  CL 98 99 92*  CO2 26 24 24   GLUCOSE 169* 91 107*  BUN 37* 45* 61*  CREATININE 7.87*  7.77* 9.08* 9.74*  CALCIUM 8.7* 8.9 8.5*  PHOS 4.7* 5.6* 6.2*   Liver Function Tests: Recent Labs  Lab 12/03/18 1944 12/04/18 1103 12/06/18 1729  ALBUMIN 1.9* 2.1* 2.0*   CBC: Recent Labs  Lab 12/01/18 1414  12/02/18 0813 12/03/18 0325 12/03/18 1944 12/06/18 1729  WBC 14.8*  --  11.2* 7.2 7.0 9.1  NEUTROABS 12.0*  --   --   --   --   --   HGB 8.4*   < > 8.7* 8.0* 8.0* 7.9*  HCT 27.6*   < > 27.6* 24.4* 24.8* 25.1*  MCV 98.6  --  95.2 93.1 95.8 96.5  PLT 333  --  311 296 315 347   < > = values in this interval not displayed.   Blood Culture    Component Value Date/Time   SDES BLOOD RIGHT HAND 12/01/2018 1509   SPECREQUEST  12/01/2018 1509    BOTTLES DRAWN AEROBIC AND ANAEROBIC Blood Culture results may not be optimal due to an inadequate volume of blood received in culture bottles   CULT  12/01/2018 1509     NO GROWTH 5 DAYS Performed at [facility], [place] [address], [place], [place] [address]    REPTSTATUS 12/06/2018 FINAL 12/01/2018 1509    CBG: Recent Labs  Lab 12/06/18 0641 12/06/18 1138 12/06/18 1643 12/06/18 2147 12/07/18 0611  GLUCAP 73 84 82 134* 100*   Medications:  . amitriptyline  10 mg Oral QHS  . atorvastatin  80 mg Oral QHS  . calcium acetate  2,668 mg Oral TID WC  . Chlorhexidine Gluconate Cloth  6 each Topical Q0600  . cinacalcet  30 mg Oral QPM  . clopidogrel  75 mg Oral Daily  . darbepoetin (ARANESP) injection - DIALYSIS  25 mcg Intravenous Q Thu-HD  . docusate sodium  100 mg Oral Daily  . feeding supplement (PRO-STAT SUGAR FREE 64)  30 mL Oral BID  . heparin  5,000 Units Subcutaneous Q8H  . insulin aspart  0-15 Units Subcutaneous TID WC  . insulin glargine  80 Units Subcutaneous QHS  . levothyroxine  125 mcg Oral Q0600  . lidocaine  1 patch Transdermal Q24H  . Melatonin  9 mg Oral QHS  . pantoprazole  40 mg Oral Daily  .  pregabalin  150 mg Oral TID  . risperiDONE  0.25 mg Oral Daily    Dialysis Orders: Center:[facility]. EDW 119.5 kg; Time: 4.5 hr; 2K/2.5Ca BFR 400, DFR 600Temp 35C Primary nephrologist: Dr. Lowanda Foster  Heparin 2000 unit bolus and 1000 units/hr Epogen 1000 units 3x week Hectorol 0.87mcg IV 3x week Venofer 100 mg IV 3x week  Recent labs 11/25/2018: Hgb 10.0, Ca 9.1, Phos 4.9, Corr Ca 9.2, K 5.1  Assessment/Plan: 1. Ischemic R foot: S/p emergent amputation, now in inpatient rehab.S/p course of Vanc/Zosyn. 2. ESRD:Continue HD per usual TTS schedule - holding heparin x 1 week s/p surgery. 3. Hypertension/volume:BP overall well controlled, no edema on exam today.  4. Anemia:Hgb 7.9,Aranesp63mcg given 4/23 -trend hemoglobin, likely needs ^ dose. 5. Metabolic bone disease: Corr Ca improving- 10.1, VDRA held. Phos 6.2- trending up, follow- may need increased dose.Continue phoslo binder, sensiparfor  now. 6. Nutrition:Albuminvery low (2.0) - continue protein supplements. 7. Diabetes mellitus with neuropathy: Management per primary. On lyrica.  8. COPD:No SOB at present. Management per primary. 9. Tremor: Has reported intermittent tremor, not related to dialysis. Had been present for at least several months and not worsening. Reports he plans to follow up with PCP after discharge. Monitor.   Clayton Paganini, PA-C 12/07/2018, 11:20 AM  [facility] Pager: [phone]  I have seen and examined this patient and agree with plan and assessment in the above note with renal recommendations/intervention highlighted.  Clayton John A Yutaka Holberg,MD 12/07/2018 11:38 AM

## 2018-12-08 ENCOUNTER — Inpatient Hospital Stay (HOSPITAL_COMMUNITY): Payer: Medicare Other

## 2018-12-08 ENCOUNTER — Encounter (HOSPITAL_COMMUNITY): Payer: Medicare Other | Admitting: Psychology

## 2018-12-08 DIAGNOSIS — Z8659 Personal history of other mental and behavioral disorders: Secondary | ICD-10-CM

## 2018-12-08 DIAGNOSIS — E162 Hypoglycemia, unspecified: Secondary | ICD-10-CM

## 2018-12-08 DIAGNOSIS — Z992 Dependence on renal dialysis: Secondary | ICD-10-CM

## 2018-12-08 DIAGNOSIS — N186 End stage renal disease: Secondary | ICD-10-CM

## 2018-12-08 LAB — GLUCOSE, CAPILLARY
Glucose-Capillary: 64 mg/dL — ABNORMAL LOW (ref 70–99)
Glucose-Capillary: 75 mg/dL (ref 70–99)
Glucose-Capillary: 125 mg/dL — ABNORMAL HIGH (ref 70–99)
Glucose-Capillary: 148 mg/dL — ABNORMAL HIGH (ref 70–99)
Glucose-Capillary: 115 mg/dL — ABNORMAL HIGH (ref 70–99)
Glucose-Capillary: 129 mg/dL — ABNORMAL HIGH (ref 70–99)

## 2018-12-08 MED ORDER — CHLORHEXIDINE GLUCONATE CLOTH 2 % EX PADS: 6.0000 | MEDICATED_PAD | Freq: Every day | CUTANEOUS | Status: DC

## 2018-12-08 MED ORDER — INSULIN GLARGINE 100 UNIT/ML ~~LOC~~ SOLN
70.0000 [IU] | Freq: Every day | SUBCUTANEOUS | Status: DC
  Administered 2018-12-08 – 2018-12-09 (×2): 70 [IU] via SUBCUTANEOUS
  Filled 2018-12-08 (×4): qty 0.7

## 2018-12-08 NOTE — Progress Notes (Signed)
[facility] PHYSICAL MEDICINE & REHABILITATION PROGRESS NOTE  Subjective/Complaints: Patient seen laying in bed this morning.  He states he slept well overnight.  He states he had a good weekend.  Informed by nursing regarding hypoglycemia this morning.  ROS: Denies CP, shortness of breath, nausea, vomiting, diarrhea.  Objective: Vital Signs: Blood pressure (!) 142/70, pulse 76, temperature 98.4 F (36.9 C), temperature source Oral, resp. rate 16, height 6\' 4"  (1.93 m), weight 118.6 kg, SpO2 95 %. No results found. Recent Labs    12/06/18 1729  WBC 9.1  HGB 7.9*  HCT 25.1*  PLT 347   Recent Labs    12/06/18 1729  NA 132*  K 4.5  CL 92*  CO2 24  GLUCOSE 107*  BUN 61*  CREATININE 9.74*  CALCIUM 8.5*    Physical Exam: BP (!) 142/70 (BP Location: Right Arm)   Pulse 76   Temp 98.4 F (36.9 C) (Oral)   Resp 16   Ht 6\' 4"  (1.93 m)   Wt 118.6 kg   SpO2 95%   BMI 31.83 kg/m  Constitutional: No distress . Vital signs reviewed. Obese. HENT: Normocephalic.  Atraumatic. Eyes: EOMI.  No discharge. Cardiovascular: No JVD. Respiratory: Normal effort. GI: Non-distended. Musc: Right BKA with edema and tenderness, improving  Neurological: He is alert and oriented.  Patient is alert and follows full commands.  Motor:  Left lower extremity: Hip flexion, knee extension 5/5, ankle dorsiflexion 4-/5, stable Right lower extremity: 4-/5 hip flexion, knee extension (pain inhibition), improving Skin: Amputation site with staples C/D/I Psychiatric: He has a normal mood and affect. His behavior is normal.    Assessment/Plan: 1. Functional deficits secondary to right BKA which require 3+ hours per day of interdisciplinary therapy in a comprehensive inpatient rehab setting.  Physiatrist is providing close team supervision and 24 hour management of active medical problems listed below.  Physiatrist and rehab team continue to assess barriers to discharge/monitor patient progress toward  functional and medical goals  Care Tool:  Bathing  Bathing activity did not occur: Refused(refused to attempt LB bathing, unable to lift buttocks off w/c with max assist) Body parts bathed by patient: Right arm, Left arm, Chest, Abdomen, Front perineal area         Bathing assist       Upper Body Dressing/Undressing Upper body dressing Upper body dressing/undressing activity did not occur (including orthotics): N/A What is the patient wearing?: Pull over shirt    Upper body assist Assist Level: Supervision/Verbal cueing    Lower Body Dressing/Undressing Lower body dressing    Lower body dressing activity did not occur: N/A What is the patient wearing?: Underwear/pull up     Lower body assist Assist for lower body dressing: Moderate Assistance - Patient 50 - 74%     Toileting Toileting Toileting Activity did not occur Landscape architect and hygiene only): Safety/medical concerns  Toileting assist Assist for toileting: Maximal Assistance - Patient 25 - 49%     Transfers Chair/bed transfer  Transfers assist  Chair/bed transfer activity did not occur: N/A  Chair/bed transfer assist level: Contact Guard/Touching assist     Locomotion Ambulation   Ambulation assist   Ambulation activity did not occur: Safety/medical concerns(pt generalized weakness)          Walk 10 feet activity   Assist  Walk 10 feet activity did not occur: N/A        Walk 50 feet activity   Assist Walk 50 feet with 2 turns activity  did not occur: N/A         Walk 150 feet activity   Assist Walk 150 feet activity did not occur: N/A         Walk 10 feet on uneven surface  activity   Assist Walk 10 feet on uneven surfaces activity did not occur: Safety/medical concerns         Wheelchair     Assist Will patient use wheelchair at discharge?: Yes Type of Wheelchair: Manual Wheelchair activity did not occur: N/A  Wheelchair assist level:  Supervision/Verbal cueing Max wheelchair distance: 120'    Wheelchair 50 feet with 2 turns activity    Assist    Wheelchair 50 feet with 2 turns activity did not occur: N/A   Assist Level: Supervision/Verbal cueing   Wheelchair 150 feet activity     Assist Wheelchair 150 feet activity did not occur: N/A   Assist Level: Supervision/Verbal cueing      Medical Problem List and Plan: 1.   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 Antithrombotics: -DVT/anticoagulation:  Subcutaneous heparin due to              -antiplatelet therapy:  Plavix 75 mg daily 3. Pain Management:   Phantom limb pain improving  Lyrica 150 mg 3 times a day, Elavil 10 started on 4/23  Lidoderm patch ordered on 4/23  Oxycodone as needed             Encourage desensitization techniques, mirror therapy  Overall improving 4. Mood:             -antipsychotic agents: Risperdal 0.25 mg daily 5. Neuropsych: This patient is capable of making decisions on his own behalf. 6. Skin/Wound Care: Routine skin care 7. Fluids/Electrolytes/Nutrition:  Routine I/Os.   8. Acute on chronic anemia.   Hb 7.9 on 4/25  Labs with HD  Cont to monitor 9. End-stage renal disease. Continue hemodialysis 10. Diabetes mellitus and peripheral neuropathy. HbA1c 11.2 on 4/16.   Lantus insulin 85 units daily at bedtime, decreased to 70 on 4/27.  Diabetic teaching.     CBG (last 3)  Recent Labs    12/08/18 0609 12/08/18 0632 12/08/18 1008  GLUCAP 64* 75 125*   Hypoglycemia on 4/27   Monitor with increased mobility 11. Hypertension.   Vitals:   12/07/18 1940 12/08/18 0505  BP: (!) 160/67 (!) 142/70  Pulse: 72 76  Resp: 16 16  Temp: 98 F (36.7 C) 98.4 F (36.9 C)  SpO2: 98% 95%     Monitor with increased mobility 12. COPD with remote tobacco abuse. Continue nebulizer as needed 13. Hyperlipidemia.  Lipitor 14. Hypothyroidism. Synthroid 15. GERD. Protonix 16. Constipation. Laxative assistance 17.  Sleep disturbance  Melatonin 9 mg daily at bedtime  Trazodone d/ced  Elavil started on 4/23  Improving  LOS: 5 days A FACE TO FACE EVALUATION WAS PERFORMED  [name] Lorie Phenix 12/08/2018, 11:03 AM

## 2018-12-08 NOTE — Progress Notes (Signed)
Occupational Therapy Session Note  Patient Details  Name: [name] MRN: [id number] Date of Birth: 1951/04/26  Today's Date: 12/08/2018 OT Individual Time: 0930-1040 OT Individual Time Calculation (min): 70 min    Short Term Goals: Week 1:  OT Short Term Goal 1 (Week 1): Pt will complete sit > stand with max assist in preparation for self-care tasks OT Short Term Goal 2 (Week 1): Pt will complete toilet transfer with mod assist  OT Short Term Goal 3 (Week 1): Pt will complete bathing with mod assist at sit > stand level OT Short Term Goal 4 (Week 1): Pt will complete LB dressing with mod assist  Skilled Therapeutic Interventions/Progress Updates:    Pt resting EOB upon arrival and agreeable to therapy.  OT intervention with focus on functional transfers, bathing/dressing at w/c level, w/c mobility, discharge planning, safety awareness, and activity tolerance to increase independence with BADLs. Pt placed sliding board and performed sliding board tranfser to w/c with CGA.  Pt completed UB bathing/dressing tasks at sink.  Pt declined LB bathing/dressing this morning.  Pt able to place leg rests on w/c without assistance. Pt propelled w/c to tub room to discuss bathroom setup at home.  Pt owns tub transfer bench which he had used PTA.  Pt stated his goal on discharge is to be able to walk/hop into bathroom to use shower at home.  W/c will not fit into bathroom.  Pt propelled w/c back to room and remained in w/c with belt alarm activated and all needs within reach.   Therapy Documentation Precautions:  Precautions Precautions: Fall Restrictions Weight Bearing Restrictions: Yes RLE Weight Bearing: Non weight bearing  Pain:  Pt c/o 5/10 pain in RLE; RN admin meds, repositionned   Therapy/Group: Individual Therapy  [name] 12/08/2018, 11:44 AM

## 2018-12-08 NOTE — Consult Note (Signed)
Neuropsychological Consultation   Patient:   [name]   DOB:   1950/12/24  MR Number:  [id number]  Location:  [facility] [address] CENTER [address] [place] [id number] [place] [address] Dept: [place]: [phone]           Date of Service:   12/08/2018  Start Time:   8 AM End Time:   9 AM  Provider/Observer:  Ilean Skill, Psy.D.       Clinical Neuropsychologist       Billing Code/Service: [phone]  Chief Complaint:    [name] is a 68 year old male with history of COPD, ESRD, diabetes, peripheral neuropathy, hypertension and peripheral vascular disease.  Presented 12/01/18 with ischemic right foot.  Underwent emergent right BKA on 12/01/2018.  Patient has a past history of anxiety and hemodialysis three days per week prior.    Reason for Service:  Patient was referred for neuropsychological consultation due to coping and adjustment issues and to monitor for possible increase in anxiety that would impact therapy.  Below is the HPI for the current admission.  HPI: [name] is a 68 year old right-handed male history of COPD who quit smoking 6 years ago, end-stage renal disease with hemodialysis Tuesday Thursday and Saturday at [facility] in [facility], diabetes mellitus peripheral neuropathy, hypertension and peripheral vascular disease maintained on Plavix.  Per chart review and patient, patient lives with his wife. Independent ADLs and uses a Rollator for functional mobility. one level home with ramped entrance. Presented 12/01/2018 with ischemic right foot. Per report patient turned his right great toe approximately 1 week prior and developed a black line on the bottom of the toe.  X-rays were performed which showed gaseous changes, concerning for necrotizing infection.  Limb was not felt to be salvageable. 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 [name].Hospital course complicated by pain.   Hemodialysis ongoing as per renal services. A carotid Dopplers done for evaluation of bruit showed left ICA stenosis 40-59% and monitored.  Hospital course further complicated by acute on chronic anemia.  Patient notes that he had a fall prior to this and has had gluteal pain since that time.  He is unsure how he is going to be able to tolerate rehab.  Subcutaneous heparin for DVT prophylaxis. Therapy evaluation completed with recommendations of physical medicine rehabilitation consult. Patient was admitted for a comprehensive rehabilitation program.  Please see preadmission assessment from earlier today as well.  Current Status:  Patient reports that anxiety has improved as he has progressed in therapy.  Initially, having trouble with foot leading to BKA patient was worried and anxious about loss of leg.  He does have close family member with prosthetic but worried that he would not be able to manage.  As patient has improved skills with OT his anxiety has improved.    Behavioral Observation: [name]  presents as a 68 y.o.-year-old Right Caucasian Male who appeared his stated age. his dress was Appropriate and he was Well Groomed and his manners were Appropriate to the situation.  his participation was indicative of Appropriate and Attentive behaviors.  There were any physical disabilities noted.  he displayed an appropriate level of cooperation and motivation.     Interactions:    Active Appropriate and Attentive  Attention:   within normal limits and attention span and concentration were age appropriate  Memory:   within normal limits; recent and remote memory  intact  Visuo-spatial:  not examined  Speech (Volume):  normal  Speech:   normal; normal  Thought Process:  Coherent and Relevant  Though Content:  WNL; not suicidal and not homicidal  Orientation:   person, place, time/date and  situation  Judgment:   Good  Planning:   Good  Affect:    Anxious  Mood:    Anxious  Insight:   Good  Intelligence:   normal  Medical History:   Past Medical History:  Diagnosis Date  . Anemia   . Anxiety   . Arthritis   . Chronic kidney disease   . COPD (chronic obstructive pulmonary disease) ([place])    Pt denies  . Depression   . Diabetes mellitus without complication ([place])   . GERD (gastroesophageal reflux disease)   . H/O hiatal hernia   . Headache(784.0)    Hx: Migraines  . Hypertension   . Neuropathy   . Numbness of toes    toes and feet  . Pneumonia    Hx: of several times  . Renal insufficiency   . Shortness of breath dyspnea   . Type 2 diabetes mellitus ([place])      Psychiatric History:  Patient has past history of anxiety.  Family Med/Psych History:  Family History  Problem Relation Age of Onset  . Heart disease Father        Heart Disease before age 91    Risk of Suicide/Violence: low Patient denies   Impression/DX:  [name] is a 68 year old male with history of COPD, ESRD, diabetes, peripheral neuropathy, hypertension and peripheral vascular disease.  Presented 12/01/18 with ischemic right foot.  Underwent emergent right BKA on 12/01/2018.  Patient has a past history of anxiety and hemodialysis three days per week prior.   Patient reports that anxiety has improved as he has progressed in therapy.  Initially, having trouble with foot leading to BKA patient was worried and anxious about loss of leg.  He does have close family member with prosthetic but worried that he would not be able to manage.  As patient has improved skills with OT his anxiety has improved.     Disposition/Plan:  Will follow-up with patient later in the week.     Diagnosis:    BKA        Electronically Signed   _______________________ Ilean Skill, Psy.D.

## 2018-12-08 NOTE — Progress Notes (Addendum)
[facility] Progress Note   Subjective:   Patient seen in room. No new concerns. Denies SOB, wheezing, new onset cough, abdominal pain, N/V/D. Reports stump pain is slowly improving.   Objective Vitals:   12/07/18 0621 12/07/18 1315 12/07/18 1940 12/08/18 0505  BP: 138/70 138/69 (!) 160/67 (!) 142/70  Pulse: 75 72 72 76  Resp: 18 16 16 16   Temp: 98.7 F (37.1 C) 98.8 F (37.1 C) 98 F (36.7 C) 98.4 F (36.9 C)  TempSrc: Oral Oral Oral Oral  SpO2: 98% 98% 98% 95%  Weight:      Height:       Physical Exam General:Well developed male, alert and in NAD Heart:RRR, no murmurs, rubs or gallops Lungs:CTA bilaterally without wheezing, rhonchi or rales Abdomen:Soft, non-tender, non-distended. + BS, no palpable masses Extremities:No edema bilateral lower extremities, R BKA bandaged Dialysis Access:LUE AVF + thrill  Additional Objective Labs: Basic Metabolic Panel: Recent Labs  Lab 12/03/18 1944 12/04/18 1103 12/06/18 1729  NA 136 137 132*  K 3.9 4.5 4.5  CL 98 99 92*  CO2 26 24 24   GLUCOSE 169* 91 107*  BUN 37* 45* 61*  CREATININE 7.87*  7.77* 9.08* 9.74*  CALCIUM 8.7* 8.9 8.5*  PHOS 4.7* 5.6* 6.2*   Liver Function Tests: Recent Labs  Lab 12/03/18 1944 12/04/18 1103 12/06/18 1729  ALBUMIN 1.9* 2.1* 2.0*   CBC: Recent Labs  Lab 12/01/18 1414  12/02/18 0813 12/03/18 0325 12/03/18 1944 12/06/18 1729  WBC 14.8*  --  11.2* 7.2 7.0 9.1  NEUTROABS 12.0*  --   --   --   --   --   HGB 8.4*   < > 8.7* 8.0* 8.0* 7.9*  HCT 27.6*   < > 27.6* 24.4* 24.8* 25.1*  MCV 98.6  --  95.2 93.1 95.8 96.5  PLT 333  --  311 296 315 347   < > = values in this interval not displayed.   Blood Culture    Component Value Date/Time   SDES BLOOD RIGHT HAND 12/01/2018 1509   SPECREQUEST  12/01/2018 1509    BOTTLES DRAWN AEROBIC AND ANAEROBIC Blood Culture results may not be optimal due to an inadequate volume of blood received in culture bottles   CULT   12/01/2018 1509    NO GROWTH 5 DAYS Performed at [facility], [place] [address] [place], [place] [address]    REPTSTATUS 12/06/2018 FINAL 12/01/2018 1509   CBG: Recent Labs  Lab 12/07/18 2133 12/08/18 0609 12/08/18 0632 12/08/18 1008 12/08/18 1145  GLUCAP 165* 64* 75 125* 148*   Medications:  . amitriptyline  10 mg Oral QHS  . atorvastatin  80 mg Oral QHS  . calcium acetate  2,668 mg Oral TID WC  . Chlorhexidine Gluconate Cloth  6 each Topical Q0600  . cinacalcet  30 mg Oral QPM  . clopidogrel  75 mg Oral Daily  . darbepoetin (ARANESP) injection - DIALYSIS  25 mcg Intravenous Q Thu-HD  . docusate sodium  100 mg Oral Daily  . feeding supplement (PRO-STAT SUGAR FREE 64)  30 mL Oral BID  . heparin  5,000 Units Subcutaneous Q8H  . insulin aspart  0-15 Units Subcutaneous TID WC  . insulin glargine  70 Units Subcutaneous QHS  . levothyroxine  125 mcg Oral Q0600  . lidocaine  1 patch Transdermal Q24H  . Melatonin  9 mg Oral QHS  . pantoprazole  40 mg Oral Daily  . pregabalin  150 mg  Oral TID  . risperiDONE  0.25 mg Oral Daily     [facility]   4.5h   119.5kg   2/2.5 bath   400/600  LUE AVF  Hep 2000 then 1K/hr Primary nephrologist: Dr. Lowanda Foster Epogen 1000 units 3x week Hectorol 0.59mcg IV 3x week Venofer 100 mg IV 3x week  Recent labs 11/25/2018: Hgb 10.0, Ca 9.1, Phos 4.9, Corr Ca 9.2, K 5.1  Assessment/Plan: 1. Ischemic R foot: S/p emergent amputation on 12/01/2018, now in inpatient rehab.S/p course of Vanc/Zosyn. Continue rehab. 2. ESRD:Continue HD per usual TTS schedule - will resume heparin with next HD. 3. Hypertension/volume:BP elevated but no edema on exam today. Will plan for HD tomorrow with UFG 3L. Now significantly below EDW, will need to be lowered at discharge.  4. Anemia:Hgb 7.9,Aranesp56mcg given 4/23 -trend hemoglobin,likely needs ^ dose. 5. Metabolic bone disease: Corr Ca improving-10.1, VDRA held. Phos 6.2- trending up, follow-  may need increased dose.Continue phoslo binder, sensiparfor now. 6. Nutrition:Albuminvery low (2.0) - continue protein supplements. 7. Diabetes mellitus with neuropathy: Management per primary. On lyrica.  8. COPD:No SOB at present. Management per primary. 9. Tremor:Has reported intermittent tremor previously, not related to dialysis. Had been present for at least several months and not worsening. Reports he plans to follow up with PCP after discharge. Monitor.    [name], PA-C 12/08/2018, 11:54 AM  [facility] Pager: [phone]  Pt seen, examined and agree w A/P as above.  [facility] 12/08/2018, 12:28 PM

## 2018-12-08 NOTE — Progress Notes (Signed)
Hypoglycemic Event  CBG: 64  Treatment: 4 oz juice/soda  Symptoms: None  Follow-up CBG: [id number] CBG Result:75  Possible Reasons for Event: Medication regimen: Lantus 80units  Comments/MD notified:ate snack    [name]

## 2018-12-08 NOTE — Progress Notes (Signed)
Physical Therapy Session Note  Patient Details  Name: [name] MRN: [id number] Date of Birth: [birth year]  Today's Date: 12/08/2018 PT Individual Time: 1300-1415 and 1521-1605 PT Individual Time Calculation (min): 75 min and 44 min  Short Term Goals: Week 1:  PT Short Term Goal 1 (Week 1): pt will move supine> sit wiht supervision PT Short Term Goal 2 (Week 1): pt will transfer bed>< w/c wiht min assist PT Short Term Goal 3 (Week 1): pt will propel in home setting x 25' with supervision without encountering obstacles PT Short Term Goal 4 (Week 1): pt will propel w/c up/down ramp with mod assist PT Short Term Goal 5 (Week 1): pt will initiate standng in parallel bars   Skilled Therapeutic Interventions/Progress Updates:   tx 1:  Pt seated in w/c.  R residual limb no on pad; knee flexed 90 degrees; pt stated that it had started to hurt.  PT ediucated pt on importance of keeping R knee extended as much as possible, and demonstrated to him that manipulating the legrest lower was tolerable and his knee was nearly extended.  W/c propulsion using bil UEs over level tile x 100' before fatiguing; 22" wide chair veers L constantly.    PT donned L shoe before standing; pt requested that protective foam bandage be removed first.  PT did this; shoe would not fit over foam dressing and sock.  PT switched pt into 20" wide w/c via standing in parallel bars.  Seated Therapeutic exercise performed with LE to increase strength for functional mobility: 15 x 1 L calf raises, L long arc quad knee extension with ankle pump at end range. Sit> stand in parallel bars, pulling up with bil hands on bars, max assist.  Pre-gait in parallel bars: Standing with min> max assist for L knee control, during 10 x 1 each R hip extension, R hip abduction. Seated rest.  In standing, 10 x 1 R knee extension with neutral hip.  Pt stood x 1 minute with mirror feedback , working on L knee control.    PT checked  proprioception L knee: pt accurate 5/5 movements, in sitting.  Slide board transfer w/c to mat, level, with mod cues for set -up, CGA.   Using hand -out of HEP, pt performed 10 x 1 each: L knee isometric extension with towel roll under residual limb, L unilateral bridging.   Bed mobility on firm mat with supervision.   Slide board transfer mat> w/c with close supervision and cues for sequencing.  Pt left resting in w/c with needs at hand and seat belt alarm set, with amputee ed booklet set up for him and encouragement to read about PT and OT during pre-prosthetic period.  PT informed [place], LPN that L heel dsg was removed.  tx 2:  Pt sitting up in w/c.  He stated that he has a hard time understanding the words in the booklet.  PT provided emotional support and reassured him that all staff would help him understand pre-prosthetic and prosthetic care.   W/c propulsion in 20' wide chair with superviison, slowly but with less veering L.  Propelled up/down ramp with min assist. PT educated pt on importance of approaching transition with casters straight.  Pt voiced understanding.    Slide board transfer to L w/c> mat.  Pt needs cues for sequencing, but is resentful of VCs.  Sit> partial stand to elevate hips off of raised mat, min assist, x 5.  Sit> prone with supervision.  Pt  used 1 pillow under head, 1 under pelvis for prone lying, total x 7 minutes.  In prone, 5 x 1 each R/L isolated hip extension with flexed knees.  Slide board transfer to return to w/c, with 2 visual cues but no VCs which pt was more receptive to.  Pt left resting in w/c with needs at hand.  PT feels that pt is safe without alarm in sitting; pt refused seat pad alarm.  PT notified [place], [place].            Therapy Documentation Precautions:  Precautions Precautions: Fall Restrictions Weight Bearing Restrictions: No RLE Weight Bearing: Non weight bearing Therapy Vitals Temp: 98.4 F (36.9 C) Temp  Source: Oral Pulse Rate: 72 Resp: 18 BP: 139/65 Patient Position (if appropriate): Sitting Oxygen Therapy SpO2: 97 % O2 Device: Room Air Pain: tx 1 and tx 2: 4/10 R residual limb; premedicated Pain Assessment Pain Scale: 0-10 Pain Score: 0-No pain Faces Pain Scale: No hurt      Therapy/Group: Individual Therapy  Gearl Kimbrough 12/08/2018, 4:31 PM

## 2018-12-09 ENCOUNTER — Inpatient Hospital Stay (HOSPITAL_COMMUNITY): Payer: Medicare Other

## 2018-12-09 ENCOUNTER — Inpatient Hospital Stay (HOSPITAL_COMMUNITY): Payer: Medicare Other | Admitting: Occupational Therapy

## 2018-12-09 ENCOUNTER — Inpatient Hospital Stay (HOSPITAL_COMMUNITY): Payer: Medicare Other | Admitting: Physical Therapy

## 2018-12-09 DIAGNOSIS — K5903 Drug induced constipation: Secondary | ICD-10-CM

## 2018-12-09 DIAGNOSIS — R0989 Other specified symptoms and signs involving the circulatory and respiratory systems: Secondary | ICD-10-CM

## 2018-12-09 DIAGNOSIS — R7309 Other abnormal glucose: Secondary | ICD-10-CM

## 2018-12-09 LAB — GLUCOSE, CAPILLARY
Glucose-Capillary: 94 mg/dL (ref 70–99)
Glucose-Capillary: 93 mg/dL (ref 70–99)
Glucose-Capillary: 108 mg/dL — ABNORMAL HIGH (ref 70–99)

## 2018-12-09 MED ORDER — POLYETHYLENE GLYCOL 3350 17 G PO PACK: 17.0000 g | PACK | Freq: Two times a day (BID) | ORAL | Status: DC

## 2018-12-09 MED ORDER — HEPARIN SODIUM (PORCINE) 1000 UNIT/ML DIALYSIS
5000.0000 [IU] | Freq: Once | INTRAMUSCULAR | Status: AC
  Administered 2018-12-09: 5000 [IU] via INTRAVENOUS_CENTRAL

## 2018-12-09 MED ORDER — SENNOSIDES-DOCUSATE SODIUM 8.6-50 MG PO TABS
2.0000 | ORAL_TABLET | Freq: Every day | ORAL | Status: DC
  Administered 2018-12-09 – 2018-12-18 (×10): 2 via ORAL
  Filled 2018-12-09 (×8): qty 2

## 2018-12-09 MED ORDER — POLYETHYLENE GLYCOL 3350 17 G PO PACK
17.0000 g | PACK | Freq: Two times a day (BID) | ORAL | Status: DC
  Administered 2018-12-09 – 2018-12-19 (×16): 17 g via ORAL
  Filled 2018-12-09 (×19): qty 1

## 2018-12-09 MED ORDER — HEPARIN SODIUM (PORCINE) 1000 UNIT/ML IJ SOLN
INTRAMUSCULAR | Status: AC
  Administered 2018-12-09: 5000 [IU] via INTRAVENOUS_CENTRAL
  Filled 2018-12-09: qty 5

## 2018-12-09 NOTE — Progress Notes (Signed)
Charge nurse noted pt does not require CHG. CN ask the tech not to administer CHG.

## 2018-12-09 NOTE — Progress Notes (Signed)
Occupational Therapy Session Note  Patient Details  Name: [name] MRN: [id number] Date of Birth: January 25, 1951  Today's Date: 12/09/2018 OT Individual Time: 1834-3735 OT Individual Time Calculation (min): 60 min    Short Term Goals: Week 1:  OT Short Term Goal 1 (Week 1): Pt will complete sit > stand with max assist in preparation for self-care tasks OT Short Term Goal 2 (Week 1): Pt will complete toilet transfer with mod assist  OT Short Term Goal 3 (Week 1): Pt will complete bathing with mod assist at sit > stand level OT Short Term Goal 4 (Week 1): Pt will complete LB dressing with mod assist  Skilled Therapeutic Interventions/Progress Updates:    Pt sitting EOB upon arrival and agreeable to participating in therapy.  OT intervention with focus on functional sliding board transfers, w/c mobility, activity tolerance, safety awareness, and discharge planning.  Pt performs sliding board transfer including board placement at supervision level to w/c.  Pt completed UB bathing and partial LB bathing at w/c level.  Pt exhibited difficulty using LUE when extending elbow to reach handle on faucet.  Pt stated it felt like his arm "locked up." LUE PROM WFL and pt did not experience occurrence again.  Pt with occasional intention tremors during bathing tasks.  Pt states that has been going on for some time.  Pt issued w/c gloves which allowed pt increased efficiency with w/c propulsion.  Pt owns TTB and drop arm BSC. Pt propelled approx 200'+ before returning to room.  Pt remained in w/c with all needs within reach.   Therapy Documentation Precautions:  Precautions Precautions: Fall Restrictions Weight Bearing Restrictions: No RLE Weight Bearing: Non weight bearing  Pain:  Pt c/o 4/10 pain in RLE; repositioned and meds admin during session   Therapy/Group: Individual Therapy  [name] 12/09/2018, 10:28 AM

## 2018-12-09 NOTE — Progress Notes (Addendum)
[facility] Progress Note   Subjective:   Patient seen in room. Reports constipation and nausea this AM, now improving after meds. Denies SOB, dyspnea, wheezing, cough, BP, abdominal pain, peripheral edema. Planned for HD today.   Objective Vitals:   12/08/18 1426 12/08/18 2205 12/09/18 0538 12/09/18 0540  BP: 139/65 (!) 156/71  (!) 145/65  Pulse: 72 76 70 71  Resp: 18 18 20    Temp: 98.4 F (36.9 C)  98.3 F (36.8 C)   TempSrc: Oral     SpO2: 97%  98% 95%  Weight:      Height:       Physical Exam General:Well developed male, alert and in NAD Heart:RRR, no murmurs, rubs or gallops Lungs: Respirations even and unlabored. Wheezing RLL.No rhonchi or rales auscultated. Abdomen:Soft, mildly tender b/l lower quadrants, non-distended. + BS, no palpable masses Extremities:No edema bilateral lower extremities, R BKA bandaged Dialysis Access:LUE AVF + thrill   Additional Objective Labs: Basic Metabolic Panel: Recent Labs  Lab 12/03/18 1944 12/04/18 1103 12/06/18 1729  NA 136 137 132*  K 3.9 4.5 4.5  CL 98 99 92*  CO2 26 24 24   GLUCOSE 169* 91 107*  BUN 37* 45* 61*  CREATININE 7.87*  7.77* 9.08* 9.74*  CALCIUM 8.7* 8.9 8.5*  PHOS 4.7* 5.6* 6.2*   Liver Function Tests: Recent Labs  Lab 12/03/18 1944 12/04/18 1103 12/06/18 1729  ALBUMIN 1.9* 2.1* 2.0*   CBC: Recent Labs  Lab 12/03/18 0325 12/03/18 1944 12/06/18 1729  WBC 7.2 7.0 9.1  HGB 8.0* 8.0* 7.9*  HCT 24.4* 24.8* 25.1*  MCV 93.1 95.8 96.5  PLT 296 315 347   Blood Culture    Component Value Date/Time   SDES BLOOD RIGHT HAND 12/01/2018 1509   SPECREQUEST  12/01/2018 1509    BOTTLES DRAWN AEROBIC AND ANAEROBIC Blood Culture results may not be optimal due to an inadequate volume of blood received in culture bottles   CULT  12/01/2018 1509    NO GROWTH 5 DAYS Performed at [facility], [place] [address], [place], [place] [address]    REPTSTATUS 12/06/2018 FINAL 12/01/2018 1509     Cardiac Enzymes: No results for input(s): CKTOTAL, CKMB, CKMBINDEX, TROPONINI in the last 168 hours. CBG: Recent Labs  Lab 12/08/18 1008 12/08/18 1145 12/08/18 1647 12/08/18 2202 12/09/18 0635  GLUCAP 125* 148* 115* 129* 94   Medications:  . amitriptyline  10 mg Oral QHS  . atorvastatin  80 mg Oral QHS  . calcium acetate  2,668 mg Oral TID WC  . Chlorhexidine Gluconate Cloth  6 each Topical Q0600  . Chlorhexidine Gluconate Cloth  6 each Topical Q0600  . cinacalcet  30 mg Oral QPM  . clopidogrel  75 mg Oral Daily  . darbepoetin (ARANESP) injection - DIALYSIS  25 mcg Intravenous Q Thu-HD  . feeding supplement (PRO-STAT SUGAR FREE 64)  30 mL Oral BID  . heparin  5,000 Units Subcutaneous Q8H  . insulin aspart  0-15 Units Subcutaneous TID WC  . insulin glargine  70 Units Subcutaneous QHS  . levothyroxine  125 mcg Oral Q0600  . lidocaine  1 patch Transdermal Q24H  . Melatonin  9 mg Oral QHS  . pantoprazole  40 mg Oral Daily  . polyethylene glycol  17 g Oral BID  . pregabalin  150 mg Oral TID  . risperiDONE  0.25 mg Oral Daily  . senna-docusate  2 tablet Oral QHS    Dialysis Orders: [facility]  4.5h   119.5kg   2/2.5 bath   400/600  LUE AVF  Hep 2000 then 1K/hr Primary nephrologist: Dr. Lowanda Foster Epogen 1000 units 3x week Hectorol 0.54mcg IV 3x week Venofer 100 mg IV 3x week  Recent labs 11/25/2018: Hgb 10.0, Ca 9.1, Phos 4.9, Corr Ca 9.2, K 5.1  Assessment/Plan: 1. 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 Pain improving. Continue rehab. 2. Constipation/Nausea: Reports it is improving after meds this AM. Per primary.  3. ESRD:HD today per usual TTS schedule. K+ 4.5. Resume heparin with HD today.  4. Hypertension/volume:BP elevated with scattered wheezing, no edema on exam. UFG 3.0L today. Now significantly below EDW, will need to be lowered at discharge.  5. Anemia:Hgb7.9,Aranesp37mcg given 4/23  -trend hemoglobin,likely needs ^ dose. 6. Metabolic bone disease: Corr Caimproving-10.1, VDRA held. Phos6.2- trending up, follow- may need increased dose.Continue phoslo binder, sensiparfor now. 7. Nutrition:Albuminvery low (2.0) - continue protein supplements. 8. Diabetes mellitus with neuropathy: Management per primary. On lyrica.  9. COPD:No SOB at present. Management per primary. 10. Tremor: No tremor at present.Has reported intermittent tremor previously, not related to dialysis. Had been present for at least several months and not worsening. Reports he plans to follow up with PCP after discharge. Monitor.  [name], PA-C 12/09/2018, 10:10 AM  [facility] Pager: [phone]  Pt seen, examined and agree w A/P as above.  [facility] 12/09/2018, 11:22 AM

## 2018-12-09 NOTE — Progress Notes (Signed)
[facility] PHYSICAL MEDICINE & REHABILITATION PROGRESS NOTE  Subjective/Complaints: Patient seen sitting up in bed this morning, eating breakfast.  He states he slept well overnight.  He notes persistent constipation.  ROS: + Constipation.  Denies CP, shortness of breath, nausea, vomiting, diarrhea.  Objective: Vital Signs: Blood pressure (!) 145/65, pulse 71, temperature 98.3 F (36.8 C), resp. rate 20, height 6\' 4"  (1.93 m), weight 118.6 kg, SpO2 95 %. No results found. Recent Labs    12/06/18 1729  WBC 9.1  HGB 7.9*  HCT 25.1*  PLT 347   Recent Labs    12/06/18 1729  NA 132*  K 4.5  CL 92*  CO2 24  GLUCOSE 107*  BUN 61*  CREATININE 9.74*  CALCIUM 8.5*    Physical Exam: BP (!) 145/65   Pulse 71   Temp 98.3 F (36.8 C)   Resp 20   Ht 6\' 4"  (1.93 m)   Wt 118.6 kg   SpO2 95%   BMI 31.83 kg/m  Constitutional: No distress . Vital signs reviewed. Obese. HENT: Normocephalic.  Atraumatic. Eyes: EOMI.  No discharge. Cardiovascular: No JVD. Respiratory: Normal effort. GI: Non-distended. Musc: Right BKA with edema and tenderness, improving Neurological: He is alert and oriented.  Patient is alert and follows full commands.  Motor:  Left lower extremity: Hip flexion, knee extension 5/5, ankle dorsiflexion 4/5 Right lower extremity: 4-/5 hip flexion, knee extension (pain inhibition), unchanged Skin: Amputation site with dressing C/D/I Psychiatric: He has a normal mood and affect. His behavior is normal.    Assessment/Plan: 1. Functional deficits secondary to right BKA which require 3+ hours per day of interdisciplinary therapy in a comprehensive inpatient rehab setting.  Physiatrist is providing close team supervision and 24 hour management of active medical problems listed below.  Physiatrist and rehab team continue to assess barriers to discharge/monitor patient progress toward functional and medical goals  Care Tool:  Bathing  Bathing activity did not  occur: Refused(refused to attempt LB bathing, unable to lift buttocks off w/c with max assist) Body parts bathed by patient: Right arm, Left arm, Chest, Abdomen(declined bahting LB)         Bathing assist Assist Level: Supervision/Verbal cueing(UB only/declined LB)     Upper Body Dressing/Undressing Upper body dressing Upper body dressing/undressing activity did not occur (including orthotics): N/A What is the patient wearing?: Pull over shirt    Upper body assist Assist Level: Set up assist    Lower Body Dressing/Undressing Lower body dressing    Lower body dressing activity did not occur: N/A What is the patient wearing?: Underwear/pull up     Lower body assist Assist for lower body dressing: Moderate Assistance - Patient 50 - 74%     Toileting Toileting Toileting Activity did not occur Landscape architect and hygiene only): Safety/medical concerns  Toileting assist Assist for toileting: Maximal Assistance - Patient 25 - 49%     Transfers Chair/bed transfer  Transfers assist  Chair/bed transfer activity did not occur: N/A  Chair/bed transfer assist level: Contact Guard/Touching assist     Locomotion Ambulation   Ambulation assist   Ambulation activity did not occur: Safety/medical concerns(pt generalized weakness)          Walk 10 feet activity   Assist  Walk 10 feet activity did not occur: N/A        Walk 50 feet activity   Assist Walk 50 feet with 2 turns activity did not occur: N/A  Walk 150 feet activity   Assist Walk 150 feet activity did not occur: N/A         Walk 10 feet on uneven surface  activity   Assist Walk 10 feet on uneven surfaces activity did not occur: Safety/medical concerns         Wheelchair     Assist Will patient use wheelchair at discharge?: Yes Type of Wheelchair: Manual Wheelchair activity did not occur: N/A  Wheelchair assist level: Supervision/Verbal cueing Max wheelchair distance:  150    Wheelchair 50 feet with 2 turns activity    Assist    Wheelchair 50 feet with 2 turns activity did not occur: N/A   Assist Level: Supervision/Verbal cueing   Wheelchair 150 feet activity     Assist Wheelchair 150 feet activity did not occur: N/A   Assist Level: Supervision/Verbal cueing      Medical Problem List and Plan: 1.   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 Antithrombotics: -DVT/anticoagulation:  Subcutaneous heparin due to              -antiplatelet therapy:  Plavix 75 mg daily 3. Pain Management:   Phantom limb pain improving  Lyrica 150 mg 3 times a day, Elavil 10 started on 4/23  Lidoderm patch ordered on 4/23  Oxycodone as needed             Encourage desensitization techniques, mirror therapy  Relatively controlled on 4/28 4. Mood:             -antipsychotic agents: Risperdal 0.25 mg daily 5. Neuropsych: This patient is capable of making decisions on his own behalf. 6. Skin/Wound Care: Routine skin care 7. Fluids/Electrolytes/Nutrition:  Routine I/Os.   8. Acute on chronic anemia.   Hb 7.9 on 4/25  Labs with HD  Cont to monitor 9. End-stage renal disease. Continue hemodialysis 10. Diabetes mellitus and peripheral neuropathy. HbA1c 11.2 on 4/16.   Lantus insulin 85 units daily at bedtime, decreased to 70 on 4/27.  Diabetic teaching.     CBG (last 3)  Recent Labs    12/08/18 1647 12/08/18 2202 12/09/18 0635  GLUCAP 115* 129* 94   Slightly labile on 4/28, we will not make any changes today  Monitor with increased mobility 11. Hypertension.   Vitals:   12/09/18 0538 12/09/18 0540  BP:  (!) 145/65  Pulse: 70 71  Resp: 20   Temp: 98.3 F (36.8 C)   SpO2: 98% 95%   Labile on 4/28  Recs per nephro  Monitor with increased mobility 12. COPD with remote tobacco abuse. Continue nebulizer as needed 13. Hyperlipidemia.  Lipitor 14. Hypothyroidism. Synthroid 15. GERD. Protonix 16.  Drug-induced constipation. Laxative assistance  Bowel regimen creased on 4/28 17.  Sleep disturbance  Melatonin 9 mg daily at bedtime  Trazodone d/ced  Elavil started on 4/23  Improving  LOS: 6 days A FACE TO FACE EVALUATION WAS PERFORMED  [name] Lorie Phenix 12/09/2018, 9:17 AM

## 2018-12-09 NOTE — Plan of Care (Signed)
  Problem: RH SKIN INTEGRITY Goal: RH STG MAINTAIN SKIN INTEGRITY WITH ASSISTANCE Description STG Maintain Skin Integrity With min Assistance.  Outcome: Progressing  Surgical incision continue to assess and administer care as order Problem: RH PAIN MANAGEMENT Goal: RH STG PAIN MANAGED AT OR BELOW PT'S PAIN GOAL Description Pain scale <4/10  Outcome: Progressing  Assess pain, and administer pain regimen as order

## 2018-12-09 NOTE — Progress Notes (Signed)
Physical Therapy Session Note  Patient Details  Name: [name] MRN: [id number] Date of Birth: 1950/11/21  Today's Date: 12/09/2018 PT Individual Time: 1100-1208 PT Individual Time Calculation (min): 68 min   Short Term Goals: Week 1:  PT Short Term Goal 1 (Week 1): pt will move supine> sit wiht supervision PT Short Term Goal 2 (Week 1): pt will transfer bed>< w/c wiht min assist PT Short Term Goal 3 (Week 1): pt will propel in home setting x 25' with supervision without encountering obstacles PT Short Term Goal 4 (Week 1): pt will propel w/c up/down ramp with mod assist PT Short Term Goal 5 (Week 1): pt will initiate standng in parallel bars   Skilled Therapeutic Interventions/Progress Updates:    Patient received in [facility], willing to participate in therapy but reporting he is not feeling well today- "my stump hurts, my stomach hurts, and I'm shaky!". Able to self-propel WC with non-surgical LE, B UEs, and extended time. Able to perform sliding board transfer to mat table ultimately with S but required Max cues for repositioning of board for optimal transfer. Attempted sit to stands from elevated mat table with maxA and RW multiple times, patient  Unable to perform safely as he ended up using hamstrings to slide himself forward along the table instead of pushing up, had to stop transfer attempts due to safety concerns. From there worked on functional exercises on mat table including quad sets, quad set/SLRs, sidelying hip ABD, and sidelying hip flexor stretches. Required MinA for supine to sit. Able to transfer back to [facility] with S and Mod cues for board placement. Attempted to perform sit to stands in parallel bars, however before arriving at bars noted patient had bumped a scab on his leg and was actively bleeding, RN aware and placed bandage. Returned patient back to his room with totalA, patient then able to perform sliding board transfer back to bed with S but Max cues for [facility] and board  placement. He was left sitting at EOB with nurse tech present and attending, all needs otherwise met. Patient very talkative during session and required frequent redirection.   Therapy Documentation Precautions:  Precautions Precautions: Fall Restrictions Weight Bearing Restrictions: No RLE Weight Bearing: Non weight bearing   Pain: Pain Assessment Pain Scale: 0-10 Pain Score: 4  Faces Pain Scale: No hurt Pain Type: Acute pain Pain Location: Other (Comment)(stomach ) Pain Orientation: Mid Pain Descriptors / Indicators: Aching Pain Onset: On-going Patients Stated Pain Goal: 0 Pain Intervention(s): Medication (See eMAR);Repositioned;Ambulation/increased activity Multiple Pain Sites: No    Therapy/Group: Individual Therapy  Deniece Ree PT, DPT, CBIS  Supplemental Physical Therapist [facility]    Pager [phone] Acute Rehab Office [phone]   12/09/2018, 12:48 PM

## 2018-12-09 NOTE — Progress Notes (Signed)
Occupational Therapy Session Note  Patient Details  Name: [name] MRN: [id number] Date of Birth: Apr 20, 1951  Today's Date: 12/09/2018 OT Individual Time: 1000-1100 OT Individual Time Calculation (min): 60 min    Short Term Goals: Week 1:  OT Short Term Goal 1 (Week 1): Pt will complete sit > stand with max assist in preparation for self-care tasks OT Short Term Goal 2 (Week 1): Pt will complete toilet transfer with mod assist  OT Short Term Goal 3 (Week 1): Pt will complete bathing with mod assist at sit > stand level OT Short Term Goal 4 (Week 1): Pt will complete LB dressing with mod assist  Skilled Therapeutic Interventions/Progress Updates:    Treatment session with focus on functional transfers and proper setup and positioning of slide board.  Pt received upright in w/c reporting constipated but wanting to wait until after HD to attempt another suppository.  Engaged in prolonged discussion about importance of BM, with pt in agreement but wanting ot wait until after HD.  Engaged in slide board transfers w/c <> wide drop arm BSC with focus on proper setup of slide board.  Therapist provided verbal cues initially to draw attention to what aspects to look for during setup, pt able to return demonstration of proper positioning without additional cues.  Completed slide board transfer to [facility] with min cues for head/hips relationship and CGA during transfer.  Engaged in lateral leans and even mini pushups to partial standing as needed for clothing management during toileting.  Returned to w/c with mod assist as pt reports board moving during transfer.  Pt able to manage all w/c parts prior to and post transfer with increased time.  Pt will continue to benefit from transfer training/practice.  Therapy Documentation Precautions:  Precautions Precautions: Fall Restrictions Weight Bearing Restrictions: No RLE Weight Bearing: Non weight bearing Pain: Pain Assessment Pain Scale: 0-10 Pain  Score: 5  Faces Pain Scale: No hurt Pain Type: Surgical pain Pain Location: Leg Pain Orientation: Right Pain Descriptors / Indicators: Aching Pain Onset: On-going Patients Stated Pain Goal: 0 Pain Intervention(s): Medication (See eMAR) Multiple Pain Sites: No   Therapy/Group: Individual Therapy  Simonne Come 12/09/2018, 12:14 PM

## 2018-12-10 ENCOUNTER — Inpatient Hospital Stay (HOSPITAL_COMMUNITY): Payer: Medicare Other

## 2018-12-10 LAB — GLUCOSE, CAPILLARY
Glucose-Capillary: 77 mg/dL (ref 70–99)
Glucose-Capillary: 53 mg/dL — ABNORMAL LOW (ref 70–99)
Glucose-Capillary: 88 mg/dL (ref 70–99)
Glucose-Capillary: 100 mg/dL — ABNORMAL HIGH (ref 70–99)
Glucose-Capillary: 112 mg/dL — ABNORMAL HIGH (ref 70–99)
Glucose-Capillary: 145 mg/dL — ABNORMAL HIGH (ref 70–99)

## 2018-12-10 MED ORDER — INSULIN GLARGINE 100 UNIT/ML ~~LOC~~ SOLN
55.0000 [IU] | Freq: Every day | SUBCUTANEOUS | Status: DC
  Administered 2018-12-10 – 2018-12-11 (×2): 55 [IU] via SUBCUTANEOUS
  Filled 2018-12-10 (×3): qty 0.55

## 2018-12-10 MED ORDER — BISACODYL 10 MG RE SUPP
10.0000 mg | Freq: Once | RECTAL | Status: AC
  Administered 2018-12-10: 12:00:00 10 mg via RECTAL

## 2018-12-10 MED ORDER — DARBEPOETIN ALFA 40 MCG/0.4ML IJ SOSY
40.0000 ug | PREFILLED_SYRINGE | INTRAMUSCULAR | Status: DC
  Administered 2018-12-11: 18:00:00 40 ug via INTRAVENOUS

## 2018-12-10 MED ORDER — CHLORHEXIDINE GLUCONATE CLOTH 2 % EX PADS: 6.0000 | MEDICATED_PAD | Freq: Every day | CUTANEOUS | Status: DC

## 2018-12-10 NOTE — Progress Notes (Signed)
Occupational Therapy Session Note  Patient Details  Name: [name] MRN: [id number] Date of Birth: [birth year]  Today's Date: 12/10/2018 OT Individual Time: 0701-0800 OT Individual Time Calculation (min): 59 min    Short Term Goals: Week 1:  OT Short Term Goal 1 (Week 1): Pt will complete sit > stand with max assist in preparation for self-care tasks OT Short Term Goal 2 (Week 1): Pt will complete toilet transfer with mod assist  OT Short Term Goal 3 (Week 1): Pt will complete bathing with mod assist at sit > stand level OT Short Term Goal 4 (Week 1): Pt will complete LB dressing with mod assist  Skilled Therapeutic Interventions/Progress Updates:    1;1. Pt received in bed consuming breakfast. Pt transitions to EOB to finish meal, bathe and dress for sitting balance work. Pt completes bathing/dressing UB with supervision. OT provides verabl cueing for lateral lean technique. Pt completes LB bathing with A to wash L foot and dessing with A to don L sock, using lateral leans for clothing managmet with CGA and VC for anterior trunk flexion . Pt and OT discuss suppository timing and make plan to toilet during 11 am session. Exited session with pt seated in bed, exit alarm on and all needs met Therapy Documentation Precautions:  Precautions Precautions: Fall Restrictions Weight Bearing Restrictions: No RLE Weight Bearing: Non weight bearing General:   Vital Signs: Therapy Vitals Temp: 97.8 F (36.6 C) Temp Source: Oral Pulse Rate: 70 Resp: 18 BP: 109/60 Patient Position (if appropriate): Lying Oxygen Therapy SpO2: 95 % O2 Device: Room Air Pain:   3/10 pain reporting it is "better" ADL:   Vision   Perception    Praxis   Exercises:   Other Treatments:     Therapy/Group: Individual Therapy  Tonny Branch 12/10/2018, 7:06 AM

## 2018-12-10 NOTE — Progress Notes (Signed)
[facility] PHYSICAL MEDICINE & REHABILITATION PROGRESS NOTE  Subjective/Complaints: Patient seen sitting up in bed this morning working with therapies.  He states he slept fairly overnight.  He notes he had symptomatic hypoglycemia overnight as well as this morning.  He asks about his discharge date and states he will need to think about the recommended date and decide whether or not it is something he is agreeable to overnight.  He notes persistent constipation.  ROS: + Constipation.  Denies CP, shortness of breath, nausea, vomiting, diarrhea.  Objective: Vital Signs: Blood pressure 109/60, pulse 70, temperature 97.8 F (36.6 C), temperature source Oral, resp. rate 18, height 6\' 4"  (1.93 m), weight 119.6 kg, SpO2 95 %. No results found. No results for input(s): WBC, HGB, HCT, PLT in the last 72 hours. No results for input(s): NA, K, CL, CO2, GLUCOSE, BUN, CREATININE, CALCIUM in the last 72 hours.  Physical Exam: BP 109/60 (BP Location: Right Arm)   Pulse 70   Temp 97.8 F (36.6 C) (Oral)   Resp 18   Ht 6\' 4"  (1.93 m)   Wt 119.6 kg   SpO2 95%   BMI 32.09 kg/m  Constitutional: No distress . Vital signs reviewed. Obese. HENT: Normocephalic.  Atraumatic. Eyes: EOMI.  No discharge. Cardiovascular: No JVD. Respiratory: Normal effort. GI: Non-distended. Musc: Right BKA with edema and tenderness, improving Neurological: He is alert and oriented.  Patient is alert and follows full commands.  Motor:  Left lower extremity: Hip flexion, knee extension 5/5, ankle dorsiflexion 4/5, unchanged Right lower extremity: 4-/5 hip flexion, knee extension (pain inhibition), improving Skin: Amputation site with dressing C/D/I Psychiatric: He has a normal mood and affect. His behavior is normal.    Assessment/Plan: 1. Functional deficits secondary to right BKA which require 3+ hours per day of interdisciplinary therapy in a comprehensive inpatient rehab setting.  Physiatrist is providing close  team supervision and 24 hour management of active medical problems listed below.  Physiatrist and rehab team continue to assess barriers to discharge/monitor patient progress toward functional and medical goals  Care Tool:  Bathing  Bathing activity did not occur: Refused(refused to attempt LB bathing, unable to lift buttocks off w/c with max assist) Body parts bathed by patient: Right arm, Left arm, Chest, Abdomen, Right upper leg, Left upper leg, Left lower leg     Body parts n/a: Right lower leg   Bathing assist Assist Level: Supervision/Verbal cueing(pt declined bahting buttocks and front perineal area)     Upper Body Dressing/Undressing Upper body dressing Upper body dressing/undressing activity did not occur (including orthotics): N/A What is the patient wearing?: Pull over shirt    Upper body assist Assist Level: Set up assist    Lower Body Dressing/Undressing Lower body dressing    Lower body dressing activity did not occur: Refused What is the patient wearing?: Underwear/pull up     Lower body assist Assist for lower body dressing: Moderate Assistance - Patient 50 - 74%     Toileting Toileting Toileting Activity did not occur Landscape architect and hygiene only): Safety/medical concerns  Toileting assist Assist for toileting: Maximal Assistance - Patient 25 - 49%     Transfers Chair/bed transfer  Transfers assist  Chair/bed transfer activity did not occur: N/A  Chair/bed transfer assist level: Supervision/Verbal cueing     Locomotion Ambulation   Ambulation assist   Ambulation activity did not occur: Safety/medical concerns(pt generalized weakness)          Walk 10 feet activity  Assist  Walk 10 feet activity did not occur: N/A        Walk 50 feet activity   Assist Walk 50 feet with 2 turns activity did not occur: N/A         Walk 150 feet activity   Assist Walk 150 feet activity did not occur: N/A         Walk 10 feet  on uneven surface  activity   Assist Walk 10 feet on uneven surfaces activity did not occur: Safety/medical concerns         Wheelchair     Assist Will patient use wheelchair at discharge?: Yes Type of Wheelchair: Manual Wheelchair activity did not occur: N/A  Wheelchair assist level: Supervision/Verbal cueing Max wheelchair distance: 146ft     Wheelchair 50 feet with 2 turns activity    Assist    Wheelchair 50 feet with 2 turns activity did not occur: N/A   Assist Level: Supervision/Verbal cueing   Wheelchair 150 feet activity     Assist Wheelchair 150 feet activity did not occur: N/A   Assist Level: Supervision/Verbal cueing      Medical Problem List and Plan: 1.   Decreased functional mobility secondary to right below-knee amputation 12/01/2018.[employer] consulted for stump shrinker  Continue CIR   Team conference today to discuss current and goals and coordination of care, home and environmental barriers, and discharge planning with nursing, case manager, and therapies.  2. Antithrombotics: -DVT/anticoagulation:  Subcutaneous heparin due to              -antiplatelet therapy:  Plavix 75 mg daily 3. Pain Management:   Phantom limb pain improving  Lyrica 150 mg 3 times a day, Elavil 10 started on 4/23  Lidoderm patch ordered on 4/23  Oxycodone as needed             Encourage desensitization techniques, mirror therapy  Relatively controlled on 4/29 4. Mood:             -antipsychotic agents: Risperdal 0.25 mg daily 5. Neuropsych: This patient is capable of making decisions on his own behalf. 6. Skin/Wound Care: Routine skin care 7. Fluids/Electrolytes/Nutrition:  Routine I/Os.   8. Acute on chronic anemia.   Hb 7.9 on 4/25  Labs with HD  Cont to monitor 9. End-stage renal disease. Continue hemodialysis 10. Diabetes mellitus and peripheral neuropathy. HbA1c 11.2 on 4/16.   Lantus insulin 85 units daily at bedtime, decreased to 70 on  4/27, decreased to 55 on 4/29.  Diabetic teaching.     CBG (last 3)  Recent Labs    12/10/18 0624 12/10/18 0723 12/10/18 0925  GLUCAP 77 53* 88   Symptomatic hypoglycemia on  4/29  Monitor with increased mobility 11. Hypertension.   Vitals:   12/09/18 2055 12/10/18 0506  BP: 125/63 109/60  Pulse: 79 70  Resp: 17 18  Temp: 98 F (36.7 C) 97.8 F (36.6 C)  SpO2: 100% 95%   Relatively controlled on 4/29  Recs per nephro  Monitor with increased mobility 12. COPD with remote tobacco abuse. Continue nebulizer as needed 13. Hyperlipidemia. Lipitor 14. Hypothyroidism. Synthroid 15. GERD. Protonix 16.  Drug-induced constipation. Laxative assistance  Bowel regimen increased on 4/28  Suppository ordered on 4/29 17.  Sleep disturbance  Melatonin 9 mg daily at bedtime  Trazodone d/ced  Elavil started on 4/23  Improving  LOS: 7 days A FACE TO FACE EVALUATION WAS PERFORMED  [name] [name] 12/10/2018, 9:41 AM

## 2018-12-10 NOTE — Patient Care Conference (Addendum)
Inpatient RehabilitationTeam Conference and Plan of Care Update Date: 12/10/2018   Time: 2:00 PM    Patient Name: [name]      Medical Record Number: [id number]  Date of Birth: 1950/11/01 Sex: Male         Room/Bed: 4M11C/4M11C-01 Payor Info: Payor: MEDICARE / Plan: MEDICARE PART A AND B / Product Type: *No Product type* /    Admitting Diagnosis: BKA  Dialysis  Admit Date/Time:  12/03/2018  5:52 PM Admission Comments: No comment available   Primary Diagnosis:  <principal problem not specified> Principal Problem: <principal problem not specified>  Patient Active Problem List   Diagnosis Date Noted  . Labile blood pressure   . Labile blood glucose   . Hx of anxiety disorder   . Hypoglycemia   . Diabetic peripheral neuropathy ([place])   . Neuropathic pain   . S/P unilateral BKA (below knee amputation), right ([place])   . Sleep disturbance   . Type 2 diabetes mellitus with peripheral neuropathy ([facility])   . Benign essential HTN   . Acute blood loss anemia   . Anemia of chronic disease   . Right below-knee amputee ([place]) 12/03/2018  . Necrotizing fasciitis of ankle and foot ([place])   . Unilateral complete BKA, right, initial encounter ([place])   . Postoperative pain   . Phantom limb pain ([place])   . Drug induced constipation   . Poorly controlled type 2 diabetes mellitus with peripheral neuropathy ([place])   . Acute on chronic anemia   . Ischemia of extremity 12/01/2018  . PAD (peripheral artery disease) ([place]) 02/12/2017  . Critical lower limb ischemia 02/11/2017  . Acute respiratory failure with hypoxia ([place]) 08/28/2015  . Hypertensive urgency 08/28/2015  . Chronic anemia 08/28/2015  . ESRD on dialysis ([place]) 08/28/2015  . Diabetes mellitus with end stage renal disease ([place]) 08/28/2015  . PVD (peripheral vascular disease) ([place]) 07/15/2014  . Peripheral vascular disease, unspecified ([place]) 01/14/2014  . Pain in limb-left arm 04/09/2013  . Guaiac + stool 04/07/2013  . Anemia 04/07/2013   . Dysphagia, unspecified(787.20) 04/07/2013  . Numbness and tingling-left arm  02/04/2013  . Cold hands and feet-left arm 02/04/2013  . Other complications due to renal dialysis device, implant, and graft 06/06/2012  . End stage renal disease ([place]) 02/05/2012  . Injury to blood vessels, unspecified site 02/05/2012  . Pain in joint, shoulder region 09/14/2011    Expected Discharge Date: Expected Discharge Date: 12/19/18  Team Members Present: Physician leading conference: Dr. Delice Lesch Social Worker Present: Ovidio Kin, LCSW Nurse Present: Frances Maywood, RN PT Present: Georjean Mode, PT OT Present: Willeen Cass, OT;Roanna Epley, COTA SLP Present: Weston Anna, SLP PPS Coordinator present : Gunnar Fusi     Current Status/Progress Goal Weekly Team Focus  Medical   Decreased functional mobility secondary to right below-knee amputation 12/01/2018.  Improve mobility, transfers, CBGs, pain, constipation, sleep  See above   Bowel/Bladder   oliguria (hemodialysis pt);continent of bowel LBM: 04/24 per documentation, pt does not recall date  keep giving daily stool softeners/laxatives to help regulate his BMs; PRN dulcolax suppository   assist with tolieting needs prn   Swallow/Nutrition/ Hydration             ADL's   UB bathing/dressing-supervision; LB bathing-min A lateral leans; LB dressing-mod A lateral leans or sit<>stand; functional sliding board transfers-CGA; toileting-mod A  supervision overall  functional transfers, sit<>stand, standing balance, discharge planning, educaiton, activity tolerance   Mobility   supervision-min A bed  mobility, supervision for slide board transfers, mod-max A for sit<>stand and stand pivot transfers, variable on safety for transfers, supervision s/c mobility 150'  supervision for bed mobility and transfers, no ambulation goals at this time, mod I for w/c mobility 150' and ramp  strengthening/ROM, functional mobility, balance, edema control,  skin integrity, w/c mobility, activity tolerance, pt/famility education   Communication             Safety/Cognition/ Behavioral Observations            Pain   c/o pain on surgical site; PRN oxycodone q4hr  <3/10 pain level   assess pain qshift and prn    Skin   BKA right leg, staples as closure; diabetic ulcer on bottom of left foot-healing   treat current skin issues per orders; remain free new skin infections/breakdown  assess skin qshift and prn       *See Care Plan and progress notes for long and short-term goals.     Barriers to Discharge  Current Status/Progress Possible Resolutions Date Resolved   Physician    Medical stability;Wound Care;Weight bearing restrictions     See above  Therapies, optimize insulin, optimize pain meds, optimize bowel reg      Nursing                  PT  Hemodialysis;Wound Care  Low blood glucose and increased fatigue on days following dialysis.              OT                  SLP                SW                Discharge Planning/Teaching Needs:  HOme with wife who can assist at discharge. Pt is doing better physically and emotionally seeing neuro-psych for coping      Team Discussion:  Goals supervision wheelchair level no gait goals. Working on activity tolerance and endurance. Pt quite weak and unable to bear weight on his leg. MD adjusting insulin and pt is sleeping better. Pain issued better. Will need family education prior to DC home with wife  Revisions to Treatment Plan:  DC 5/8    Continued Need for Acute Rehabilitation Level of Care: The patient requires daily medical management by a physician with specialized training in physical medicine and rehabilitation for the following conditions: Daily direction of a multidisciplinary physical rehabilitation program to ensure safe treatment while eliciting the highest outcome that is of practical value to the patient.: Yes Daily medical management of patient stability for increased  activity during participation in an intensive rehabilitation regime.: Yes Daily analysis of laboratory values and/or radiology reports with any subsequent need for medication adjustment of medical intervention for : Post surgical problems;Diabetes problems;Other   I attest that I was present, lead the team conference, and concur with the assessment and plan of the team. Teleconference held due to COVID 19   Theodor Mustin, Gardiner Rhyme 12/10/2018, 3:23 PM

## 2018-12-10 NOTE — Progress Notes (Signed)
Social Work Patient ID: [name], male   DOB: [birth year], 68 y.o.   MRN: [id number] Met with pt to discuss team conference goals supervision level and target discharge date 5/8. He feels this may be long enough he can the reach the goals he wants to reach. He feels he needs to get stronger. Discussed having wife come in and go through therapies with him. He wanted worker to call her and set this up. Have contacted wife and she will be here next Wed at 1:00 for education. Will work on discharge needs.

## 2018-12-10 NOTE — Progress Notes (Signed)
Social Work Patient ID: [name], male   DOB: [birth year], 68 y.o.   MRN: [id number]      Diagnosis codes:Z89.511, 491.22 and N18.6  Height: 6'4               Weight: 260      Patient suffers from R-BKA and ESRD   which impairs his ability to perform daily activities like ADL's and toileting   in the home.  A walker  will not resolve issue with performing activities of daily living.  A wheelchair will allow patient to safely perform daily activities.  Patient is not able to propel themselves in the home using a standard weight wheelchair due to  Non-ambulatory due to BKA .  Patient can self propel in the lightweight wheelchair.

## 2018-12-10 NOTE — Progress Notes (Signed)
Physical Therapy Session Note  Patient Details  Name: [name] MRN: [id number] Date of Birth: 09-05-1950  Today's Date: 12/10/2018 PT Individual Time: 0915-1015 PT Individual Time Calculation (min): 60 min   Short Term Goals: Week 1:  PT Short Term Goal 1 (Week 1): pt will move supine> sit wiht supervision PT Short Term Goal 2 (Week 1): pt will transfer bed>< w/c wiht min assist PT Short Term Goal 3 (Week 1): pt will propel in home setting x 25' with supervision without encountering obstacles PT Short Term Goal 4 (Week 1): pt will propel w/c up/down ramp with mod assist PT Short Term Goal 5 (Week 1): pt will initiate standng in parallel bars   Skilled Therapeutic Interventions/Progress Updates:     Patient sitting EOB upon PT arrival. Patient alert and agreeable to PT session. Reported low blood glucose this morning, NT rechecked at beginning of session blood glucose 88 and patient asymptomatic.  Therapeutic Activity: Bed Mobility: Patient performed supine to/from sit with supervision-min A for trunk support with bed flat without use of bed rails. Provided verbal cues for rolling on L side and pushing up through UEs. Transfers: Patient performed stand pivot transfer with RW with mod A with bed elevated for standing from a higher surface. Provided verbal cues for erect posture, straightening and pushing through L knee in standing, reaching back before sitting. Attempted stand pivot transfer to w/c from standing and patient unable to swivel his L foot all the way, stated "I can't feel my foot," and sat early in transfer with max A from therapist to send hips back into the w/c.  Wheelchair Mobility:  Patient propelled wheelchair 10 feet with supervision. Provided verbal cues for donning leg rests and use of breaks.  Therapeutic Exercise: Patient performed the following exercises following HEP in CIR notebook with verbal and tactile cues for proper technique. -R LAQ in sitting x10 -R  quat sets in supine with 5 second holds x10 -R SLR x5 with min A AAROM -B glut sets with 5 second holds x10 -B isometric adductor hold for 5 sec x10 -Bridging x5 for 2 sets with min A from therapist for L LE elevation -w/c push ups x5 with L LE assist  Patient in w/c at end of session with breaks locked, and all needs within reach.    Therapy Documentation Precautions:  Precautions Precautions: Fall Restrictions Weight Bearing Restrictions: No RLE Weight Bearing: Non weight bearing Vital Signs: Therapy Vitals Pulse Rate: 74 BP: (!) 117/59 Patient Position (if appropriate): Sitting Pain: Pain Assessment Pain Scale: 0-10 Pain Score: 5  Pain Type: Acute pain;Surgical pain Pain Location: Leg Pain Orientation: Right Pain Descriptors / Indicators: Burning;Stabbing Pain Frequency: Constant Pain Onset: On-going Pain Intervention(s): Repositioned;Distraction Multiple Pain Sites: Yes 2nd Pain Site Pain Score: 5 Pain Type: Acute pain Pain Location: Leg Pain Orientation: Right;Upper(thigh) Pain Descriptors / Indicators: Burning Pain Frequency: Intermittent Pain Onset: Sudden Pain Intervention(s): Repositioned;Distraction    Therapy/Group: Individual Therapy  Norm Wray L Kialee Kham PT, DPT  12/10/2018, 1:33 PM

## 2018-12-10 NOTE — Progress Notes (Signed)
Occupational Therapy Session Note  Patient Details  Name: [name] MRN: [id number] Date of Birth: [birth year]  Today's Date: 12/10/2018 OT Individual Time: 1430-1515 OT Individual Time Calculation (min): 45 min    Short Term Goals: Week 1:  OT Short Term Goal 1 (Week 1): Pt will complete sit > stand with max assist in preparation for self-care tasks OT Short Term Goal 2 (Week 1): Pt will complete toilet transfer with mod assist  OT Short Term Goal 3 (Week 1): Pt will complete bathing with mod assist at sit > stand level OT Short Term Goal 4 (Week 1): Pt will complete LB dressing with mod assist  Skilled Therapeutic Interventions/Progress Updates:    Pt received supine with c/o pain "Burning sensation" in residual limb. Pt donned shoe on LLE with (S). Pt used slideboard to transfer to w/c, CGA provided with 1 cue for w/c set up. Pt unable to remove slideboard following transfer, requiring mod A to pull. Pt propelled w/c 100 ft with increased time, requiring several rest breaks. Pt transferred to mat and transitioned into prone. Pt was cued through full extension of residual limb and edu re importance of this position on future prosthesis use. Pt completed several activities focused on posterior chain strengthening. Pt returned to w/c and returned to room. Pt left supine in bed with all needs met.   Therapy Documentation Precautions:  Precautions Precautions: Fall Restrictions Weight Bearing Restrictions: No RLE Weight Bearing: Non weight bearing   Therapy/Group: Individual Therapy  Curtis Sites 12/10/2018, 3:04 PM

## 2018-12-10 NOTE — Progress Notes (Signed)
Occupational Therapy Session Note  Patient Details  Name: [name] MRN: [id number] Date of Birth: March 26, 1951  Today's Date: 12/10/2018 OT Individual Time: 1100-1200 OT Individual Time Calculation (min): 60 min    Short Term Goals: Week 1:  OT Short Term Goal 1 (Week 1): Pt will complete sit > stand with max assist in preparation for self-care tasks OT Short Term Goal 2 (Week 1): Pt will complete toilet transfer with mod assist  OT Short Term Goal 3 (Week 1): Pt will complete bathing with mod assist at sit > stand level OT Short Term Goal 4 (Week 1): Pt will complete LB dressing with mod assist  Skilled Therapeutic Interventions/Progress Updates:    Pt resting in w/c upon arrival awaiting RN to come to insert suppository.  Pt performed sliding board transfer to bed with close supervision for RN.  Pt engaged in LE exercises provided on handouts.  Pt followed exercises and performed appropriately.  Pt stated he was ready to get on BSC and sat EOB with supervision.  Pt required assistance holding sliding board when transferring onto [facility] from EOB but completed task without assistance. Pt was able to doff pants using lateral leans.  Pt remained on BSC, hopeful of having a BM.  Call bell and all needs within reach.  RN notified.   Therapy Documentation Precautions:  Precautions Precautions: Fall Restrictions Weight Bearing Restrictions: No RLE Weight Bearing: Non weight bearing Pain: Pain Assessment Pain Scale: 0-10 Pain Score: 5  Pain Type: Acute pain;Surgical pain Pain Location: Leg Pain Orientation: Right Pain Descriptors / Indicators: Burning;Stabbing Pain Frequency: Constant Pain Onset: On-going Pain Intervention(s): Repositioned;Distraction Multiple Pain Sites: Yes 2nd Pain Site Pain Score: 5 Pain Type: Acute pain Pain Location: Leg Pain Orientation: Right;Upper(thigh) Pain Descriptors / Indicators: Burning Pain Frequency: Intermittent Pain Onset: Sudden Pain  Intervention(s): Repositioned;Distraction   Therapy/Group: Individual Therapy  [name] 12/10/2018, 1:20 PM

## 2018-12-10 NOTE — Progress Notes (Addendum)
[facility] Progress Note   Subjective:   Patient seen in room. Still reporting constipation, suppository ordered for today. Reports some wheezing this AM, has not used prn albuterol. Denies SOB, dyspnea, cough, CP, abdominal pain, edema.   Objective Vitals:   12/09/18 2020 12/09/18 2055 12/10/18 0506 12/10/18 0945  BP: 135/67 125/63 109/60 (!) 117/59  Pulse: 80 79 70 74  Resp:  17 18   Temp:  98 F (36.7 C) 97.8 F (36.6 C)   TempSrc:   Oral   SpO2:  100% 95%   Weight:  119.6 kg    Height:       Physical Exam General: Well developed male, alert and in NAD Heart: RRR, normal S1/S2 Lungs: Expiratory wheeze LLL. No rhonchi or rales auscultated. No accessory muscle use Abdomen: Soft, non-tender. Non-distended. + BS Extremities: No edema b/l lower extremities. S/p R BKA Dialysis Access: LUE AVF + thrill  Additional Objective Labs: Basic Metabolic Panel: Recent Labs  Lab 12/03/18 1944 12/04/18 1103 12/06/18 1729  NA 136 137 132*  K 3.9 4.5 4.5  CL 98 99 92*  CO2 26 24 24   GLUCOSE 169* 91 107*  BUN 37* 45* 61*  CREATININE 7.87*  7.77* 9.08* 9.74*  CALCIUM 8.7* 8.9 8.5*  PHOS 4.7* 5.6* 6.2*   Liver Function Tests: Recent Labs  Lab 12/03/18 1944 12/04/18 1103 12/06/18 1729  ALBUMIN 1.9* 2.1* 2.0*   CBC: Recent Labs  Lab 12/03/18 1944 12/06/18 1729  WBC 7.0 9.1  HGB 8.0* 7.9*  HCT 24.8* 25.1*  MCV 95.8 96.5  PLT 315 347   Blood Culture    Component Value Date/Time   SDES BLOOD RIGHT HAND 12/01/2018 1509   SPECREQUEST  12/01/2018 1509    BOTTLES DRAWN AEROBIC AND ANAEROBIC Blood Culture results may not be optimal due to an inadequate volume of blood received in culture bottles   CULT  12/01/2018 1509    NO GROWTH 5 DAYS Performed at [facility], [place] [address], [place], [place] [address]    REPTSTATUS 12/06/2018 FINAL 12/01/2018 1509    Cardiac Enzymes: No results for input(s): CKTOTAL, CKMB, CKMBINDEX, TROPONINI in the last  168 hours. CBG: Recent Labs  Lab 12/09/18 2106 12/10/18 0624 12/10/18 0723 12/10/18 0925 12/10/18 1118  GLUCAP 108* 77 53* 88 100*   Medications:  . amitriptyline  10 mg Oral QHS  . atorvastatin  80 mg Oral QHS  . bisacodyl  10 mg Rectal Once  . calcium acetate  2,668 mg Oral TID WC  . Chlorhexidine Gluconate Cloth  6 each Topical Q0600  . Chlorhexidine Gluconate Cloth  6 each Topical Q0600  . cinacalcet  30 mg Oral QPM  . clopidogrel  75 mg Oral Daily  . darbepoetin (ARANESP) injection - DIALYSIS  25 mcg Intravenous Q Thu-HD  . feeding supplement (PRO-STAT SUGAR FREE 64)  30 mL Oral BID  . heparin  5,000 Units Subcutaneous Q8H  . insulin aspart  0-15 Units Subcutaneous TID WC  . insulin glargine  55 Units Subcutaneous QHS  . levothyroxine  125 mcg Oral Q0600  . lidocaine  1 patch Transdermal Q24H  . Melatonin  9 mg Oral QHS  . pantoprazole  40 mg Oral Daily  . polyethylene glycol  17 g Oral BID  . pregabalin  150 mg Oral TID  . risperiDONE  0.25 mg Oral Daily  . senna-docusate  2 tablet Oral QHS     [facility] 4.5h 119.5kg 2/2.5  bath 400/600 LUE AVF Hep 2000 then 1K/hr Primary nephrologist: Dr. Lowanda Foster Epogen 1000 units 3x week Hectorol 0.23mcg IV 3x week Venofer 100 mg IV 3x week  Recent labs 11/25/2018: Hgb 10.0, Ca 9.1, Phos 4.9, Corr Ca 9.2, K 5.1  Assessment/Plan: 1. 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 Pain improving. Continue rehab. 2. Constipation: Ongoing constipation, bowel regimen per primary.   3. ESRD:Continue TTS schedule. K+ 4.5.  4. Hypertension/volume:BP overall at goal. No edema on exam. Net UF 3L yesterday, BP stable. Will continue to challenge EDW with HD tomorrow. Will likely need lower EDW at discharge.  5. Anemia:Hgb7.9,Aranesp44mcg given 4/23, will increase dose with HD tomorrow.  6. Metabolic bone disease: Corr Caimproving-10.1, VDRA held. Will use lower  Ca bath with HD tomorrow. Phos6.2- trending up, follow- may need increased dose.Continue phoslo binder, sensiparfor now. 7. Nutrition:Albuminvery low (2.0) - continue protein supplements. 8. Diabetes mellitus with neuropathy: Management per primary. On lyrica.  9. COPD:Some wheezing on exam. Recommend utilizing prn albuterol. So respiratory distress. Per primary.  10. Tremor: No tremor at present.Has reported intermittent tremor previously, not related to dialysis. Had been present for at least several months and not worsening. Reports he plans to follow up with PCP after discharge. Monitor.  [name], PA-C 12/10/2018, 11:36 AM  [facility] Pager: [phone]  Pt seen, examined and agree w A/P as above.  [facility] 12/10/2018, 2:08 PM

## 2018-12-11 ENCOUNTER — Inpatient Hospital Stay (HOSPITAL_COMMUNITY): Payer: Medicare Other | Admitting: Occupational Therapy

## 2018-12-11 ENCOUNTER — Inpatient Hospital Stay (HOSPITAL_COMMUNITY): Payer: Medicare Other | Admitting: Physical Therapy

## 2018-12-11 ENCOUNTER — Inpatient Hospital Stay (HOSPITAL_COMMUNITY): Payer: Medicare Other

## 2018-12-11 DIAGNOSIS — N2581 Secondary hyperparathyroidism of renal origin: Secondary | ICD-10-CM | POA: Diagnosis not present

## 2018-12-11 DIAGNOSIS — N186 End stage renal disease: Secondary | ICD-10-CM | POA: Diagnosis not present

## 2018-12-11 DIAGNOSIS — Z992 Dependence on renal dialysis: Secondary | ICD-10-CM | POA: Diagnosis not present

## 2018-12-11 DIAGNOSIS — D509 Iron deficiency anemia, unspecified: Secondary | ICD-10-CM | POA: Diagnosis not present

## 2018-12-11 DIAGNOSIS — D631 Anemia in chronic kidney disease: Secondary | ICD-10-CM | POA: Diagnosis not present

## 2018-12-11 LAB — RENAL FUNCTION PANEL
Albumin: 2.2 g/dL — ABNORMAL LOW (ref 3.5–5.0)
Anion gap: 18 — ABNORMAL HIGH (ref 5–15)
BUN: 66 mg/dL — ABNORMAL HIGH (ref 8–23)
CO2: 23 mmol/L (ref 22–32)
Calcium: 9.2 mg/dL (ref 8.9–10.3)
Chloride: 90 mmol/L — ABNORMAL LOW (ref 98–111)
Creatinine, Ser: 9.76 mg/dL — ABNORMAL HIGH (ref 0.61–1.24)
GFR calc Af Amer: 6 mL/min — ABNORMAL LOW (ref >60)
GFR calc non Af Amer: 5 mL/min — ABNORMAL LOW (ref >60)
Glucose, Bld: 113 mg/dL — ABNORMAL HIGH (ref 70–99)
Phosphorus: 7 mg/dL — ABNORMAL HIGH (ref 2.5–4.6)
Potassium: 4.5 mmol/L (ref 3.5–5.1)
Sodium: 131 mmol/L — ABNORMAL LOW (ref 135–145)

## 2018-12-11 LAB — GLUCOSE, CAPILLARY
Glucose-Capillary: 72 mg/dL (ref 70–99)
Glucose-Capillary: 51 mg/dL — ABNORMAL LOW (ref 70–99)
Glucose-Capillary: 61 mg/dL — ABNORMAL LOW (ref 70–99)
Glucose-Capillary: 74 mg/dL (ref 70–99)
Glucose-Capillary: 116 mg/dL — ABNORMAL HIGH (ref 70–99)
Glucose-Capillary: 101 mg/dL — ABNORMAL HIGH (ref 70–99)
Glucose-Capillary: 85 mg/dL (ref 70–99)
Glucose-Capillary: 112 mg/dL — ABNORMAL HIGH (ref 70–99)

## 2018-12-11 LAB — CBC WITH DIFFERENTIAL/PLATELET
Abs Immature Granulocytes: 0.12 10*3/uL — ABNORMAL HIGH (ref 0.00–0.07)
Basophils Absolute: 0 10*3/uL (ref 0.0–0.1)
Basophils Relative: 0 %
Eosinophils Absolute: 0.3 10*3/uL (ref 0.0–0.5)
Eosinophils Relative: 3 %
HCT: 24.2 % — ABNORMAL LOW (ref 39.0–52.0)
Hemoglobin: 7.5 g/dL — ABNORMAL LOW (ref 13.0–17.0)
Immature Granulocytes: 1 %
Lymphocytes Relative: 15 %
Lymphs Abs: 1.5 10*3/uL (ref 0.7–4.0)
MCH: 29.6 pg (ref 26.0–34.0)
MCHC: 31 g/dL (ref 30.0–36.0)
MCV: 95.7 fL (ref 80.0–100.0)
Monocytes Absolute: 0.7 10*3/uL (ref 0.1–1.0)
Monocytes Relative: 8 %
Neutro Abs: 6.9 10*3/uL (ref 1.7–7.7)
Neutrophils Relative %: 73 %
Platelets: 379 10*3/uL (ref 150–400)
RBC: 2.53 MIL/uL — ABNORMAL LOW (ref 4.22–5.81)
RDW: 16 % — ABNORMAL HIGH (ref 11.5–15.5)
WBC: 9.6 10*3/uL (ref 4.0–10.5)
nRBC: 0 % (ref 0.0–0.2)

## 2018-12-11 MED ORDER — DARBEPOETIN ALFA 40 MCG/0.4ML IJ SOSY
PREFILLED_SYRINGE | INTRAMUSCULAR | Status: AC
  Administered 2018-12-11: 40 ug via INTRAVENOUS
  Filled 2018-12-11: qty 0.4

## 2018-12-11 MED ORDER — HEPARIN SODIUM (PORCINE) 1000 UNIT/ML DIALYSIS
2000.0000 [IU] | Freq: Once | INTRAMUSCULAR | Status: AC
  Administered 2018-12-11: 2000 [IU] via INTRAVENOUS_CENTRAL
  Filled 2018-12-11: qty 2

## 2018-12-11 MED ORDER — HEPARIN SODIUM (PORCINE) 1000 UNIT/ML IJ SOLN
INTRAMUSCULAR | Status: AC
  Administered 2018-12-11: 2000 [IU] via INTRAVENOUS_CENTRAL
  Filled 2018-12-11: qty 2

## 2018-12-11 MED ORDER — COLLAGENASE 250 UNIT/GM EX OINT
TOPICAL_OINTMENT | Freq: Two times a day (BID) | CUTANEOUS | Status: DC
  Administered 2018-12-11 – 2018-12-19 (×16): via TOPICAL
  Filled 2018-12-11 (×2): qty 30

## 2018-12-11 NOTE — Progress Notes (Signed)
Orthopedic Tech Progress Note Patient Details:  JAKEOB TULLIS [birth year] [id number] Called in order to [facility] Patient ID: [name], male   DOB: 01/26/1951, 68 y.o.   MRN: [id number]   [name] 12/11/2018, 10:44 AM

## 2018-12-11 NOTE — Progress Notes (Signed)
Hypoglycemic Event  CBG: 72  Treatment: sprite and graham crackers with peanut butter  Symptoms: asymptomatic   Follow-up CBG: Time: 0710 CBG Result: 51  Follow-up CBG: Time: 0725 CBG Result: 61  Follow-up CBG: Time: 0742 CBG Result: 74  Possible Reasons for Event: Have been adjusting lantus, pt has not been snacking enough at night and waiting on breakfast tray.    Chilton Si

## 2018-12-11 NOTE — Progress Notes (Addendum)
Physical Therapy Session Note  Patient Details  Name: [name] MRN: [id number] Date of Birth: Apr 28, 1951  Today's Date: 12/11/2018 PT Individual Time: 1030-1100 PT Individual Time Calculation (min): 30 min   Short Term Goals: Week 1:  PT Short Term Goal 1 (Week 1): pt will move supine> sit wiht supervision PT Short Term Goal 2 (Week 1): pt will transfer bed>< w/c wiht min assist PT Short Term Goal 3 (Week 1): pt will propel in home setting x 25' with supervision without encountering obstacles PT Short Term Goal 4 (Week 1): pt will propel w/c up/down ramp with mod assist PT Short Term Goal 5 (Week 1): pt will initiate standng in parallel bars   Skilled Therapeutic Interventions/Progress Updates:  Pt seated in w/c.  He stated that his UEs feel weak and tired; Linna Hoff, [place] aware.  Seated strengthening: 15 x 1 R short arc quad knee extension with isometric hold at end range, LLE with 3# ankle weight--15 x 1 each long are quad knee extension with isometric hold at end range, hip flexion, bil hip abduction/adduction with manual resistance from PT, L ankle pumps, bil glut sets.  Activity to challnege balance in sitting in w/c, R limb pad off: reaching to floor on R to retrieve 10 toiletry items and then place on tabel to his L, without LOB, slowly but improving in speed.  No spillage due to sensory loss of fingertips.  PT donned size XXL knee high TED LLE to address edema.  It fit over protective dressing L heel without problem.  Pt left resting in w/c with needs at hand     Therapy Documentation Precautions:  Precautions Precautions: Fall Restrictions Weight Bearing Restrictions: No RLE Weight Bearing: Non weight bearing  Pain: 5/10, R residual limb, premedicated         Therapy/Group: Individual Therapy  Karla Vines 12/11/2018, 11:02 AM

## 2018-12-11 NOTE — Progress Notes (Signed)
Occupational Therapy Session Note  Patient Details  Name: [name] MRN: [id number] Date of Birth: 05-Feb-1951  Today's Date: 12/11/2018 OT Individual Time: 7092-9574 OT Individual Time Calculation (min): 75 min    Short Term Goals: Week 1:  OT Short Term Goal 1 (Week 1): Pt will complete sit > stand with max assist in preparation for self-care tasks OT Short Term Goal 2 (Week 1): Pt will complete toilet transfer with mod assist  OT Short Term Goal 3 (Week 1): Pt will complete bathing with mod assist at sit > stand level OT Short Term Goal 4 (Week 1): Pt will complete LB dressing with mod assist  Skilled Therapeutic Interventions/Progress Updates:    Pt sitting EOB upon arrival.  Pt stated "my arms aren't working good today.  I tried to put my hands behind my head and couldn't." Pt able to raise BUE to approx 90 but unable to hold with resistance applied. Pt also with tremors. PA notified and attended to pt.  Pt did state he used his arms propelling w/c "a lot." Pt required assistance donning shirt over his head this morning.  Pt requires assistance pulling pants over hips with lateral leans. Pt requires more than a reasonable amount of time to complete all tasks and is easily distracted. Pt transitioned to gym and engaged in SciFit level 2 for 5 mins to improve joint mobilization. Pt with improved AROM following task.  Pt returned to room and remained in w/c with all needs within reach.   Therapy Documentation Precautions:  Precautions Precautions: Fall Restrictions Weight Bearing Restrictions: No RLE Weight Bearing: Non weight bearing  Pain: Pain Assessment Pain Scale: 0-10 Pain Score: 0-No pain   Therapy/Group: Individual Therapy  [name] 12/11/2018, 9:57 AM

## 2018-12-11 NOTE — Progress Notes (Addendum)
[facility] Progress Note   Subjective:   Doing well. Still having some issues with his blood sugar. Had a BM yesterday, feeling better. Denies SOB, dyspnea, wheezing, N/V, CP, palpitations.   Objective Vitals:   12/10/18 0945 12/10/18 1355 12/10/18 1900 12/11/18 0519  BP: (!) 117/59 131/67 109/69 (!) 145/71  Pulse: 74 70 77 73  Resp:  18 14 18   Temp:  97.7 F (36.5 C) 97.8 F (36.6 C) 98.1 F (36.7 C)  TempSrc:  Oral Oral   SpO2:  98% 99% 100%  Weight:      Height:       Physical Exam General: Well developed male, alert and in NAD Heart: RRR, normal S1/S2 Lungs: Expiratory wheeze LLL. No rhonchi or rales auscultated. Respirations even and unlabored. No accessory muscle use.  Abdomen: Soft, non-tender. Non-distended. + BS Extremities: No edema b/l lower extremities. S/p R BKA Dialysis Access: LUE AVF + thrill  Additional Objective Labs: Basic Metabolic Panel: Recent Labs  Lab 12/06/18 1729  NA 132*  K 4.5  CL 92*  CO2 24  GLUCOSE 107*  BUN 61*  CREATININE 9.74*  CALCIUM 8.5*  PHOS 6.2*   Liver Function Tests: Recent Labs  Lab 12/06/18 1729  ALBUMIN 2.0*   CBC: Recent Labs  Lab 12/06/18 1729  WBC 9.1  HGB 7.9*  HCT 25.1*  MCV 96.5  PLT 347   Blood Culture    Component Value Date/Time   SDES BLOOD RIGHT HAND 12/01/2018 1509   SPECREQUEST  12/01/2018 1509    BOTTLES DRAWN AEROBIC AND ANAEROBIC Blood Culture results may not be optimal due to an inadequate volume of blood received in culture bottles   CULT  12/01/2018 1509    NO GROWTH 5 DAYS Performed at [facility], [place] [address] [place], [place] [address]    REPTSTATUS 12/06/2018 FINAL 12/01/2018 1509   CBG: Recent Labs  Lab 12/11/18 0710 12/11/18 0725 12/11/18 0742 12/11/18 0954 12/11/18 1113  GLUCAP 51* 61* 74 116* 101*   Medications:  . amitriptyline  10 mg Oral QHS  . atorvastatin  80 mg Oral QHS  . calcium acetate  2,668 mg Oral TID WC  . Chlorhexidine  Gluconate Cloth  6 each Topical Q0600  . Chlorhexidine Gluconate Cloth  6 each Topical Q0600  . cinacalcet  30 mg Oral QPM  . clopidogrel  75 mg Oral Daily  . collagenase   Topical BID  . darbepoetin (ARANESP) injection - DIALYSIS  40 mcg Intravenous Q Thu-HD  . feeding supplement (PRO-STAT SUGAR FREE 64)  30 mL Oral BID  . heparin  2,000 Units Dialysis Once in dialysis  . heparin  5,000 Units Subcutaneous Q8H  . insulin aspart  0-15 Units Subcutaneous TID WC  . insulin glargine  55 Units Subcutaneous QHS  . levothyroxine  125 mcg Oral Q0600  . lidocaine  1 patch Transdermal Q24H  . Melatonin  9 mg Oral QHS  . pantoprazole  40 mg Oral Daily  . polyethylene glycol  17 g Oral BID  . pregabalin  150 mg Oral TID  . risperiDONE  0.25 mg Oral Daily  . senna-docusate  2 tablet Oral QHS    Dialysis Orders: 4.5h 119.5kg 2/2.5 bath 400/600 LUE AVF Hep 2000 then 1K/hr Primary nephrologist: Dr. Lowanda Foster Epogen 1000 units 3x week Hectorol 0.33mcg IV 3x week Venofer 100 mg IV 3x week  Recent labs 11/25/2018: Hgb 10.0, Ca 9.1, Phos 4.9, Corr Ca 9.2, K 5.1  Assessment/Plan: 1. Ischemic R foot: S/p emergentR BKA4/20/2020, now in inpatient rehab.S/p course ofVanc/Zosyn.Pain improving.Continue rehab. 2. Constipation: Improving, had BM yesterday, bowel regimen per primary.  3. ESRD:Continue TTS schedule. K+ 4.5. Reports he cannot tolerate low BP, will keep systolic BP >753.  4. Hypertension/volume:BP overall at goal. No edema on exam. Will continue to challenge EDW with HD today- UFG 3L. Will likely need lower EDW at discharge.  5. Anemia:Hgb7.9,Aranesp53mcg given 4/23, to get 40 mcg with HD today.  6. Metabolic bone disease: Corr Caimproving-10.1, VDRA held. Will use lower Ca bath. Phos6.2- trending up, follow- may need increased dose.Continue phoslo binder, sensiparfor now.Needs updated labs- confirmed RFP and CBC drawn with HD today.  7. Nutrition:Albuminvery  low (2.0) - continue protein supplements. 8. Diabetes mellitus with neuropathy: Management per primary. On lyrica.  9. COPD:Some wheezing on exam. Recommend utilizing prn albuterol when needed. Will also challenge EDW to reduce volume. No respiratory distress.  10. Tremor:No tremor at present.Has reported intermittent tremor previously, not related to dialysis. Had been present for at least several months and not worsening. Reports he plans to follow up with PCP after discharge. Monitor.  [name], PA-C 12/11/2018, 1:00 PM  [facility] Pager: [phone]  Pt seen, examined and agree w A/P as above.  [facility] 12/11/2018, 4:55 PM

## 2018-12-11 NOTE — Progress Notes (Signed)
Physical Therapy Weekly Progress Note  Patient Details  Name: [name] MRN: [id number] Date of Birth: 28-Feb-1951  Beginning of progress report period: December 05, 2018 End of progress report period: December 11, 2018   Patient has met 4 of 5 short term goals.  He is progressing toward ramp goal with w/c mobility.   Patient continues to demonstrate the following deficits muscle weakness, decreased cardiorespiratoy endurance and decreased sitting balance, decreased standing balance, decreased postural control and decreased balance strategies and therefore will continue to benefit from skilled PT intervention to increase functional independence with mobility.  Patient progressing toward long term goals..  Continue plan of care.  PT Short Term Goals Week 1:  PT Short Term Goal 1 (Week 1): pt will move supine> sit wiht supervision PT Short Term Goal 1 - Progress (Week 1): Met PT Short Term Goal 2 (Week 1): pt will transfer bed>< w/c wiht min assist PT Short Term Goal 2 - Progress (Week 1): Met PT Short Term Goal 3 (Week 1): pt will propel in home setting x 25' with supervision without encountering obstacles PT Short Term Goal 3 - Progress (Week 1): Met PT Short Term Goal 4 (Week 1): pt will propel w/c up/down ramp with mod assist PT Short Term Goal 4 - Progress (Week 1): Progressing toward goal PT Short Term Goal 5 (Week 1): pt will initiate standng in parallel bars  PT Short Term Goal 5 - Progress (Week 1): Met Week 2:  PT Short Term Goal 1 (Week 2): Patient will perform bed<>chair transfers with supervision. PT Short Term Goal 2 (Week 2): Patient will be independent with HEP 2x per day. PT Short Term Goal 3 (Week 2): Patient will perform bed mobility independently with bed flat and without use of bed rail PT Short Term Goal 4 (Week 2): Patient will perform sit<>stand transfers with min A with LRAD for standing activities and exercise >2 min.    Therapy Documentation Precautions:   Precautions Precautions: Fall Restrictions Weight Bearing Restrictions: No RLE Weight Bearing: Non weight bearing     Shailah Gibbins L Ranee Peasley, PT, DPT  12/11/2018, 1:06 PM

## 2018-12-11 NOTE — Progress Notes (Signed)
Results for JABREEL, CHIMENTO (MRN [id number]) as of 12/11/2018 15:02  Ref. Range 12/11/2018 07:10 12/11/2018 07:25 12/11/2018 07:42 12/11/2018 09:54 12/11/2018 11:13  Glucose-Capillary Latest Ref Range: 70 - 99 mg/dL 51 (L) 61 (L) 74 116 (H) 101 (H)  Noted that blood sugars have been less than 70 mg/dl today.  Recommend decreasing Lantus dosage to 50 units daily and continuing Novolog correction scale as ordered if blood sugars continue to be low. May need to titrate dosage down as needed.   Harvel Ricks RN BSN CDE Diabetes Coordinator Pager: [phone]  8am-5pm

## 2018-12-11 NOTE — Progress Notes (Signed)
Occupational Therapy Weekly Progress Note  Patient Details  Name: [name] MRN: [id number] Date of Birth: 1951-05-03  Beginning of progress report period: December 04, 2018 End of progress report period: December 11, 2018  Patient has met 3 of 4 short term goals.  Pt is making steady progress with BADLS and functional transfers.  Pt performs LB bathing/dressing tasks seated with lateral leans.  Pt requires min/mod A for donning pants with lateral leans.  Pt requires max A for sit<>stand from w/c and will continue to use lateral leans for clothing management and LB dressing tasks . Pt performs sliding board transfers with close supervisoin/CGA. Pt fatigues easily and requires multiple rest breaks. Pt with 3+ overall BUE strength.   Patient continues to demonstrate the following deficits: muscle weakness, decreased cardiorespiratoy endurance and decreased standing balance and decreased balance strategies and therefore will continue to benefit from skilled OT intervention to enhance overall performance with BADL.  Patient progressing toward long term goals..  Continue plan of care.  OT Short Term Goals Week 1:  OT Short Term Goal 1 (Week 1): Pt will complete sit > stand with max assist in preparation for self-care tasks OT Short Term Goal 1 - Progress (Week 1): Met OT Short Term Goal 2 (Week 1): Pt will complete toilet transfer with mod assist  OT Short Term Goal 2 - Progress (Week 1): Met OT Short Term Goal 3 (Week 1): Pt will complete bathing with mod A sit<>stand OT Short Term Goal 3 - Progress (Week 1): Discontinued (comment)(pt performs lateral leans for LB bathing) OT Short Term Goal 4 (Week 1): Pt will complete LB dressing with mod assist OT Short Term Goal 4 - Progress (Week 1): Met Week 2:  OT Short Term Goal 1 (Week 2): STG=LTG 2/2 ELOS        [name] 12/11/2018, 11:45 AM

## 2018-12-11 NOTE — Progress Notes (Signed)
Occupational Therapy Session Note  Patient Details  Name: [name] MRN: [id number] Date of Birth: 1951-04-07  Today's Date: 12/11/2018 OT Individual Time: 1120-1200 OT Individual Time Calculation (min): 40 min    Short Term Goals: Week 1:  OT Short Term Goal 1 (Week 1): Pt will complete sit > stand with max assist in preparation for self-care tasks OT Short Term Goal 2 (Week 1): Pt will complete toilet transfer with mod assist  OT Short Term Goal 3 (Week 1): Pt will complete bathing with mod assist at sit > stand level OT Short Term Goal 4 (Week 1): Pt will complete LB dressing with mod assist         Skilled Therapeutic Interventions/Progress Updates:    Pt seen this session to focus on UE strength and mobility to increase ability with sit to stand skills. Pt has decreased L shoulder strength and it fatigues quickly.  During AROM exercises, his L shoulder AROM decreased with each repetition.  Pt stated it has been hurting with w/c propulsion.  Recommended he back off of that for 1-2 days and let therapists push him in w/c.  Worked on upper back strength to balance work on anterior deltoids with 2 exercises (B arm opening and single arm rows) with level 3 theraband.    Practiced w/c push ups with having pt lean forward to 45 degrees.  Pt worked on lifting hips and needed several trials to achieve. He eventually was able to elevate hips and extend arms.  Pt then worked on isometric holds of 3 seconds.  Attempted a sit to stand 2x from w/c to RW with +2 A but he was not able to further shift weight up from semi stand to stand from the low w/c position.  Pt resting in w/c with all needs met.      Therapy Documentation Precautions:  Precautions Precautions: Fall Restrictions Weight Bearing Restrictions: No RLE Weight Bearing: Non weight bearing  Vital Signs: Therapy Vitals Temp: 98.1 F (36.7 C) Pulse Rate: 73 Resp: 18 BP: (!) 145/71 Patient Position (if appropriate):  Lying Oxygen Therapy SpO2: 100 % O2 Device: Room Air Pain: 5/10 R leg - pt is due for pain medication after this session   Therapy/Group: Individual Therapy  [place] 12/11/2018, 8:29 AM

## 2018-12-11 NOTE — Progress Notes (Signed)
Physical Therapy Session Note  Patient Details  Name: [name] MRN: [id number] Date of Birth: 23-Jun-1951  Today's Date: 12/11/2018 PT Individual Time: 0935-1000 PT Individual Time Calculation (min): 25 min   Short Term Goals: Week 1:  PT Short Term Goal 1 (Week 1): pt will move supine> sit wiht supervision PT Short Term Goal 2 (Week 1): pt will transfer bed>< w/c wiht min assist PT Short Term Goal 3 (Week 1): pt will propel in home setting x 25' with supervision without encountering obstacles PT Short Term Goal 4 (Week 1): pt will propel w/c up/down ramp with mod assist PT Short Term Goal 5 (Week 1): pt will initiate standng in parallel bars   Skilled Therapeutic Interventions/Progress Updates:    Patient received up in chair, pleasant but stating "my blood sugar was low earlier and I'm still not feeling right- something feels wrong". Vitals checked and BP found to be 101/55, HR 72, SpO2 98% on room air. Asked nurse tech to perform CBG recheck as patient did have hypoglycemic episode this morning. While waiting for nurse tech, performed education on importance of checking vitals to make sure patient is OK to participate and to avoid any negative events in therapy session especially with known hypoglycemic episodes. CBG found to be 116. Proceeded with upper body strengthening from chair given time constraints of session including forward chest presses, bicep curls, and tricep press-ups from chair. He was left up in chair with all needs met this morning awaiting next therapy session.   Therapy Documentation Precautions:  Precautions Precautions: Fall Restrictions Weight Bearing Restrictions: No RLE Weight Bearing: Non weight bearing   Pain: Pain Assessment Pain Scale: Faces Pain Score: 0-No pain Faces Pain Scale: No hurt    Therapy/Group: Individual Therapy   Deniece Ree PT, DPT, CBIS  Supplemental Physical Therapist [facility]    Pager [phone] Acute Rehab  Office [phone]    12/11/2018, 12:29 PM

## 2018-12-11 NOTE — Progress Notes (Signed)
[place] PHYSICAL MEDICINE & REHABILITATION PROGRESS NOTE  Subjective/Complaints: Patient seen sitting up in bed this morning.  He states he slept well overnight.  He states he had a BM yesterday.  He has questions about his foot ulcer.  He also notes that he was symptomatically hypoglycemic again.  ROS: Denies CP, shortness of breath, nausea, vomiting, diarrhea.  Objective: Vital Signs: Blood pressure (!) 145/71, pulse 73, temperature 98.1 F (36.7 C), resp. rate 18, height 6\' 4"  (1.93 m), weight 119.6 kg, SpO2 100 %. No results found. No results for input(s): WBC, HGB, HCT, PLT in the last 72 hours. No results for input(s): NA, K, CL, CO2, GLUCOSE, BUN, CREATININE, CALCIUM in the last 72 hours.  Physical Exam: BP (!) 145/71 (BP Location: Right Arm)   Pulse 73   Temp 98.1 F (36.7 C)   Resp 18   Ht 6\' 4"  (1.93 m)   Wt 119.6 kg   SpO2 100%   BMI 32.09 kg/m  Constitutional: No distress . Vital signs reviewed. Obese. HENT: Normocephalic.  Atraumatic. Eyes: EOMI.  No discharge. Cardiovascular: No JVD. Respiratory: Normal effort. GI: Non-distended. Musc: Right BKA with edema and tenderness, improving Neurological: He is alert and oriented.  Patient is alert and follows full commands.  Motor:  Left lower extremity: Hip flexion, knee extension 5/5, ankle dorsiflexion 4/5, stable Right lower extremity: 4/5 hip flexion, knee extension (pain inhibition) Skin: Amputation site with dressing C/D/I Left plantar heel ulcer with eschar Psychiatric: He has a normal mood and affect. His behavior is normal.    Assessment/Plan: 1. Functional deficits secondary to right BKA which require 3+ hours per day of interdisciplinary therapy in a comprehensive inpatient rehab setting.  Physiatrist is providing close team supervision and 24 hour management of active medical problems listed below.  Physiatrist and rehab team continue to assess barriers to discharge/monitor patient progress toward  functional and medical goals  Care Tool:  Bathing  Bathing activity did not occur: Refused(refused to attempt LB bathing, unable to lift buttocks off w/c with max assist) Body parts bathed by patient: Right arm, Left arm, Chest, Abdomen, Right upper leg, Left upper leg, Left lower leg, Face     Body parts n/a: Right lower leg   Bathing assist Assist Level: Minimal Assistance - Patient > 75%     Upper Body Dressing/Undressing Upper body dressing Upper body dressing/undressing activity did not occur (including orthotics): N/A What is the patient wearing?: Pull over shirt    Upper body assist Assist Level: Independent    Lower Body Dressing/Undressing Lower body dressing    Lower body dressing activity did not occur: Refused What is the patient wearing?: Underwear/pull up     Lower body assist Assist for lower body dressing: Moderate Assistance - Patient 50 - 74%     Toileting Toileting Toileting Activity did not occur Landscape architect and hygiene only): Safety/medical concerns  Toileting assist Assist for toileting: Moderate Assistance - Patient 50 - 74%     Transfers Chair/bed transfer  Transfers assist  Chair/bed transfer activity did not occur: N/A  Chair/bed transfer assist level: Maximal Assistance - Patient 25 - 49%(stand pivot with RW)     Locomotion Ambulation   Ambulation assist   Ambulation activity did not occur: Safety/medical concerns(pt generalized weakness)          Walk 10 feet activity   Assist  Walk 10 feet activity did not occur: N/A        Walk 50 feet activity  Assist Walk 50 feet with 2 turns activity did not occur: N/A         Walk 150 feet activity   Assist Walk 150 feet activity did not occur: N/A         Walk 10 feet on uneven surface  activity   Assist Walk 10 feet on uneven surfaces activity did not occur: Safety/medical concerns         Wheelchair     Assist Will patient use wheelchair  at discharge?: Yes Type of Wheelchair: Manual Wheelchair activity did not occur: N/A  Wheelchair assist level: Supervision/Verbal cueing Max wheelchair distance: 10'    Wheelchair 50 feet with 2 turns activity    Assist    Wheelchair 50 feet with 2 turns activity did not occur: N/A   Assist Level: Supervision/Verbal cueing   Wheelchair 150 feet activity     Assist Wheelchair 150 feet activity did not occur: N/A   Assist Level: Supervision/Verbal cueing      Medical Problem List and Plan: 1.   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 Antithrombotics: -DVT/anticoagulation:  Subcutaneous heparin due to              -antiplatelet therapy:  Plavix 75 mg daily 3. Pain Management:   Phantom limb pain improving  Lyrica 150 mg 3 times a day, Elavil 10 started on 4/23  Lidoderm patch ordered on 4/23  Oxycodone as needed             Encourage desensitization techniques, mirror therapy  Relatively controlled on 4/30 4. Mood:             -antipsychotic agents: Risperdal 0.25 mg daily 5. Neuropsych: This patient is capable of making decisions on his own behalf. 6. Skin/Wound Care: Routine skin care  Will order Santyl for left plantar heel ulcer as well as PRAFO to limit disease progression and avoid further surgery 7. Fluids/Electrolytes/Nutrition:  Routine I/Os.   8. Acute on chronic anemia.   Hb 7.9 on 4/25  Labs with HD  Cont to monitor 9. End-stage renal disease. Continue hemodialysis 10. Diabetes mellitus and peripheral neuropathy. HbA1c 11.2 on 4/16.   Lantus insulin 85 units daily at bedtime, decreased to 70 on 4/27, decreased to 55 on 4/29.  Diabetic teaching.     CBG (last 3)  Recent Labs    12/11/18 0725 12/11/18 0742 12/11/18 0954  GLUCAP 61* 74 116*   Symptomatic hypoglycemia on 4/30  Monitor with increased mobility 11. Hypertension.   Vitals:    12/10/18 1900 12/11/18 0519  BP: 109/69 (!) 145/71  Pulse: 77 73  Resp: 14 18  Temp: 97.8 F (36.6 C) 98.1 F (36.7 C)  SpO2: 99% 100%   Labile on 4/30  Recs per nephro  Monitor with increased mobility 12. COPD with remote tobacco abuse. Continue nebulizer as needed 13. Hyperlipidemia. Lipitor 14. Hypothyroidism. Synthroid 15. GERD. Protonix 16.  Drug-induced constipation. Laxative assistance  Bowel regimen increased on 4/28  Suppository ordered on 4/29  Improving 17.  Sleep disturbance  Melatonin 9 mg daily at bedtime  Trazodone d/ced  Elavil started on 4/23  Improving  LOS: 8 days A FACE TO FACE EVALUATION WAS PERFORMED  Ankit Lorie Phenix 12/11/2018, 10:33 AM

## 2018-12-12 ENCOUNTER — Inpatient Hospital Stay (HOSPITAL_COMMUNITY): Payer: Medicare Other

## 2018-12-12 ENCOUNTER — Inpatient Hospital Stay (HOSPITAL_COMMUNITY): Payer: Medicare Other | Admitting: Occupational Therapy

## 2018-12-12 LAB — GLUCOSE, CAPILLARY
Glucose-Capillary: 128 mg/dL — ABNORMAL HIGH (ref 70–99)
Glucose-Capillary: 34 mg/dL — CL (ref 70–99)
Glucose-Capillary: 104 mg/dL — ABNORMAL HIGH (ref 70–99)
Glucose-Capillary: 118 mg/dL — ABNORMAL HIGH (ref 70–99)
Glucose-Capillary: 104 mg/dL — ABNORMAL HIGH (ref 70–99)
Glucose-Capillary: 125 mg/dL — ABNORMAL HIGH (ref 70–99)

## 2018-12-12 LAB — IRON AND TIBC
Iron: 52 ug/dL (ref 45–182)
Saturation Ratios: 21 % (ref 17.9–39.5)
TIBC: 242 ug/dL — ABNORMAL LOW (ref 250–450)
UIBC: 190 ug/dL

## 2018-12-12 MED ORDER — CHLORHEXIDINE GLUCONATE CLOTH 2 % EX PADS
6.0000 | MEDICATED_PAD | Freq: Every day | CUTANEOUS | Status: DC
  Administered 2018-12-15 – 2018-12-18 (×3): 6 via TOPICAL

## 2018-12-12 MED ORDER — INSULIN GLARGINE 100 UNIT/ML ~~LOC~~ SOLN
15.0000 [IU] | Freq: Every day | SUBCUTANEOUS | Status: DC
  Administered 2018-12-12 – 2018-12-18 (×7): 15 [IU] via SUBCUTANEOUS
  Filled 2018-12-12 (×9): qty 0.15

## 2018-12-12 NOTE — Progress Notes (Signed)
Occupational Therapy Session Note  Patient Details  Name: [name] MRN: [id number] Date of Birth: 07-26-1951  Today's Date: 12/12/2018 OT Individual Time: 5427-0623 OT Individual Time Calculation (min): 75 min    Short Term Goals: Week 2:  OT Short Term Goal 1 (Week 2): STG=LTG 2/2 ELOS  Skilled Therapeutic Interventions/Progress Updates:    OT intervention with focus on w/c mobility, bathing/dressing at w/c level, BUE therex/AROM, activity tolerance, and safety awareness to increase independence with BADLs.  Pt completed bathing/dressing at w/c level, declining LB this morning.  Discussed discharge plans.  Pt sates his bed is approximately same height as w/c and he will be able to perform sliding board transfer at home.  Pt requires more than a reasonable amount of time to complete tasks.  Pt transitioned to gym and engaged in BUE therex with Sci[employer] (level 2.5 for 7 mins) and diagonals (10 X 3) with ball. Pt BUE fatigue quickly and pt only able to raise ball to approx 90 degrees at the end of each set.  Pt propelled w/c approx 30' when returning to room.  Pt returned to room and remained in w/c with all needs within reach.   Therapy Documentation Precautions:  Precautions Precautions: Fall Restrictions Weight Bearing Restrictions: Yes RLE Weight Bearing: Non weight bearing  Pain: Pain Assessment Pain Scale: 0-10 Pain Score: 4  Pain Type: Surgical pain Pain Location: Leg Pain Orientation: Right Pain Descriptors / Indicators: Aching Pain Intervention(s): emotional support, repositioned, RN aware  Therapy/Group: Individual Therapy  [name] 12/12/2018, 9:32 AM

## 2018-12-12 NOTE — Progress Notes (Signed)
Occupational Therapy Session Note  Patient Details  Name: [name] MRN: [id number] Date of Birth: [birth year]  Today's Date: 12/12/2018 OT Individual Time: 7253-6644 OT Individual Time Calculation (min): 83 min    Short Term Goals: Week 1:  OT Short Term Goal 1 (Week 1): Pt will complete sit > stand with max assist in preparation for self-care tasks OT Short Term Goal 1 - Progress (Week 1): Met OT Short Term Goal 2 (Week 1): Pt will complete toilet transfer with mod assist  OT Short Term Goal 2 - Progress (Week 1): Met OT Short Term Goal 3 (Week 1): Pt will complete bathing with mod A sit<>stand(pt performs lateral leans for bathing while seated) OT Short Term Goal 3 - Progress (Week 1): Discontinued (comment)(pt performs lateral leans for LB bathing) OT Short Term Goal 4 (Week 1): Pt will complete LB dressing with mod assist OT Short Term Goal 4 - Progress (Week 1): Met Week 2:  OT Short Term Goal 1 (Week 2): STG=LTG 2/2 ELOS  Skilled Therapeutic Interventions/Progress Updates:    Pt received in w/c and agreeable to therapy.   Pt worked on w/c push ups with excellent arm extension. Pt able to do 10, 3 sets.  To work on sit to stand skills and stand tolerance, used a bariatric stedy. Pt able to pull up to stand with min A.  Tolerated static stand for a full minute 2x, tolerated seated on pads for 5 min and worked on stand from pads to upright 5x.    Theraband exercises for triceps, biceps, arm extensions, quad extension, and dorsiflexion of L foot.    Pt needed to toilet at end of session. Used slide board to [facility] with CGA.  Pt able to use lateral leans to doff clothing with S.    Pt wanted to stay on [facility] as he needed time. Set up with call light and nurse tech and RN aware.    Therapy Documentation Precautions:  Precautions Precautions: Fall Restrictions Weight Bearing Restrictions: Yes RLE Weight Bearing: Non weight bearing  Pain: Pain Assessment Pain Scale: 0-10 Pain  Score: 6  Pain Type: Surgical pain Pain Location: Leg Pain Orientation: Right Pain Descriptors / Indicators: Aching Pain Frequency: Constant Pain Onset: On-going Patients Stated Pain Goal: 4 Pain Intervention(s): Medication (See eMAR)   Therapy/Group: Individual Therapy  [place] 12/12/2018, 11:55 AM

## 2018-12-12 NOTE — Progress Notes (Signed)
[place] PHYSICAL MEDICINE & REHABILITATION PROGRESS NOTE  Subjective/Complaints: Patient seen sitting up in bed this morning.  He states he slept well overnight.  He notes dramatic hypoglycemia of 34 this morning, despite nighttime snack.  Discussed with nursing.  Patient also states that he is not going to wear his PRAFOs because it makes him claustrophobic.  Discussed problem bed with nursing.  Patient states he feels like he needs to have a bowel movement and does not want to make any medication changes today.  Patient also notes soreness of bilateral shoulders.  ROS: +Bilateral shoulder soreness.  Denies CP, shortness of breath, nausea, vomiting, diarrhea.  Objective: Vital Signs: Blood pressure 135/63, pulse 68, temperature (!) 97.5 F (36.4 C), temperature source Oral, resp. rate 16, height 6\' 4"  (1.93 m), weight 119.1 kg, SpO2 97 %. No results found. Recent Labs    12/11/18 1425  WBC 9.6  HGB 7.5*  HCT 24.2*  PLT 379   Recent Labs    12/11/18 1417  NA 131*  K 4.5  CL 90*  CO2 23  GLUCOSE 113*  BUN 66*  CREATININE 9.76*  CALCIUM 9.2    Physical Exam: BP 135/63 (BP Location: Right Arm)   Pulse 68   Temp (!) 97.5 F (36.4 C) (Oral)   Resp 16   Ht 6\' 4"  (1.93 m)   Wt 119.1 kg   SpO2 97%   BMI 31.96 kg/m  Constitutional: No distress . Vital signs reviewed. Obese. HENT: Normocephalic.  Atraumatic. Eyes: EOMI.  No discharge. Cardiovascular: No JVD. Respiratory: Normal effort. GI: Non-distended. Musc: Right BKA with edema and tenderness, improving Neurological: He is alert and oriented.  Patient is alert and follows full commands.  Motor:  Left lower extremity: Hip flexion, knee extension 5/5, ankle dorsiflexion 4/5, unchanged Right lower extremity: 4/5 hip flexion, knee extension (pain inhibition), unchanged Skin: Amputation site with dressing C/D/I Left plantar heel ulcer with eschar, not examined today Psychiatric: He has a normal mood and affect. His  behavior is normal.    Assessment/Plan: 1. Functional deficits secondary to right BKA which require 3+ hours per day of interdisciplinary therapy in a comprehensive inpatient rehab setting.  Physiatrist is providing close team supervision and 24 hour management of active medical problems listed below.  Physiatrist and rehab team continue to assess barriers to discharge/monitor patient progress toward functional and medical goals  Care Tool:  Bathing  Bathing activity did not occur: Refused(refused to attempt LB bathing, unable to lift buttocks off w/c with max assist) Body parts bathed by patient: Right arm, Left arm, Chest, Abdomen, Right upper leg, Left upper leg, Left lower leg, Face     Body parts n/a: Right lower leg   Bathing assist Assist Level: Minimal Assistance - Patient > 75%     Upper Body Dressing/Undressing Upper body dressing Upper body dressing/undressing activity did not occur (including orthotics): N/A What is the patient wearing?: Pull over shirt    Upper body assist Assist Level: Independent    Lower Body Dressing/Undressing Lower body dressing    Lower body dressing activity did not occur: Refused What is the patient wearing?: Underwear/pull up     Lower body assist Assist for lower body dressing: Moderate Assistance - Patient 50 - 74%     Toileting Toileting Toileting Activity did not occur Landscape architect and hygiene only): Safety/medical concerns  Toileting assist Assist for toileting: Moderate Assistance - Patient 50 - 74%     Transfers Chair/bed transfer  Transfers  assist  Chair/bed transfer activity did not occur: N/A  Chair/bed transfer assist level: Maximal Assistance - Patient 25 - 49%(stand pivot with RW)     Locomotion Ambulation   Ambulation assist   Ambulation activity did not occur: Safety/medical concerns(pt generalized weakness)          Walk 10 feet activity   Assist  Walk 10 feet activity did not occur:  N/A        Walk 50 feet activity   Assist Walk 50 feet with 2 turns activity did not occur: N/A         Walk 150 feet activity   Assist Walk 150 feet activity did not occur: N/A         Walk 10 feet on uneven surface  activity   Assist Walk 10 feet on uneven surfaces activity did not occur: Safety/medical concerns         Wheelchair     Assist Will patient use wheelchair at discharge?: Yes Type of Wheelchair: Manual Wheelchair activity did not occur: N/A  Wheelchair assist level: Supervision/Verbal cueing Max wheelchair distance: 10'    Wheelchair 50 feet with 2 turns activity    Assist    Wheelchair 50 feet with 2 turns activity did not occur: N/A   Assist Level: Supervision/Verbal cueing   Wheelchair 150 feet activity     Assist Wheelchair 150 feet activity did not occur: N/A   Assist Level: Supervision/Verbal cueing      Medical Problem List and Plan: 1.   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 Antithrombotics: -DVT/anticoagulation:  Subcutaneous heparin due to              -antiplatelet therapy:  Plavix 75 mg daily 3. Pain Management:   Phantom limb pain improving  Lyrica 150 mg 3 times a day, Elavil 10 started on 4/23  Lidoderm patch ordered on 4/23  Oxycodone as needed             Encourage desensitization techniques, mirror therapy  Relatively controlled on 5/1 4. Mood:             -antipsychotic agents: Risperdal 0.25 mg daily 5. Neuropsych: This patient is capable of making decisions on his own behalf. 6. Skin/Wound Care: Routine skin care  Continue Santyl Santyl for left plantar heel ulcer  Patient refusing PRAFO, Prevalon boot ordered 7. Fluids/Electrolytes/Nutrition:  Routine I/Os.   8. Acute on chronic anemia.   Hb 7.5 on 4/30  Labs with HD  Cont to monitor 9. End-stage renal disease. Continue  hemodialysis 10. Diabetes mellitus and peripheral neuropathy. HbA1c 11.2 on 4/16.   Lantus insulin 85 units daily at bedtime, decreased to 70 on 4/27, decreased to 55 on 4/29, decreased to 15 on 5/1.  Diabetic teaching.     CBG (last 3)  Recent Labs    12/12/18 0159 12/12/18 0645 12/12/18 0802  GLUCAP 128* 34* 104*   Hypoglycemia of 34 on 5/1  Monitor with increased mobility 11. Hypertension.   Vitals:   12/11/18 1929 12/12/18 0520  BP: 115/60 135/63  Pulse: 80 68  Resp: 18 16  Temp: (!) 97.5 F (36.4 C) (!) 97.5 F (36.4 C)  SpO2: 100% 97%   Labile on 5/1  Recs per nephro  Monitor with increased mobility 12. COPD with remote tobacco abuse. Continue nebulizer as needed 13. Hyperlipidemia. Lipitor 14. Hypothyroidism. Synthroid 15. GERD. Protonix 16.  Drug-induced constipation.  Laxative assistance  Bowel regimen increased on 4/28  Suppository ordered on 4/29  Improving overall 17.  Sleep disturbance  Melatonin 9 mg daily at bedtime  Trazodone d/ced  Elavil started on 4/23  Improving  LOS: 9 days A FACE TO FACE EVALUATION WAS PERFORMED  Demarquez Ciolek Lorie Phenix 12/12/2018, 10:33 AM

## 2018-12-12 NOTE — Progress Notes (Addendum)
[name] Progress Note   Subjective:   Patient seen in room. Reports BS has been dropping at night, otherwise feeling well. Denies SOB, CP, palpitations, dyspnea, N/V/D. Some GERD after HD yesterday but ate on dialysis while reclining.   Objective Vitals:   12/11/18 1734 12/11/18 1806 12/11/18 1929 12/12/18 0520  BP: 137/73 (!) 150/67 115/60 135/63  Pulse: 78 81 80 68  Resp: 18 19 18 16   Temp: 98.9 F (37.2 C) 98.3 F (36.8 C) (!) 97.5 F (36.4 C) (!) 97.5 F (36.4 C)  TempSrc: Oral Oral  Oral  SpO2: 98% 97% 100% 97%  Weight: 118 kg   119.1 kg  Height:       Physical Exam General:Well developed male, alert and in NAD Heart:RRR, normal S1/S2 Lungs:Expiratory wheeze LLL. No rhonchi or rales auscultated. Respirations even and unlabored. No accessory muscle use.  Abdomen:Soft, non-tender. Non-distended. + BS Extremities:No edema b/l lower extremities. S/p R BKA Dialysis Access:LUE AVF + thrill  Additional Objective Labs: Basic Metabolic Panel: Recent Labs  Lab 12/06/18 1729 12/11/18 1417  NA 132* 131*  K 4.5 4.5  CL 92* 90*  CO2 24 23  GLUCOSE 107* 113*  BUN 61* 66*  CREATININE 9.74* 9.76*  CALCIUM 8.5* 9.2  PHOS 6.2* 7.0*   Liver Function Tests: Recent Labs  Lab 12/06/18 1729 12/11/18 1417  ALBUMIN 2.0* 2.2*   No results for input(s): LIPASE, AMYLASE in the last 168 hours. CBC: Recent Labs  Lab 12/06/18 1729 12/11/18 1425  WBC 9.1 9.6  NEUTROABS  --  6.9  HGB 7.9* 7.5*  HCT 25.1* 24.2*  MCV 96.5 95.7  PLT 347 379   Blood Culture    Component Value Date/Time   SDES BLOOD RIGHT HAND 12/01/2018 1509   SPECREQUEST  12/01/2018 1509    BOTTLES DRAWN AEROBIC AND ANAEROBIC Blood Culture results may not be optimal due to an inadequate volume of blood received in culture bottles   CULT  12/01/2018 1509    NO GROWTH 5 DAYS Performed at [facility], [place] [address] [place], [place] [address]    REPTSTATUS 12/06/2018 FINAL  12/01/2018 1509    Cardiac Enzymes: No results for input(s): CKTOTAL, CKMB, CKMBINDEX, TROPONINI in the last 168 hours. CBG: Recent Labs  Lab 12/11/18 2114 12/12/18 0159 12/12/18 0645 12/12/18 0802 12/12/18 1144  GLUCAP 112* 128* 34* 104* 118*   Iron Studies: No results for input(s): IRON, TIBC, TRANSFERRIN, FERRITIN in the last 72 hours. @lablastinr3 @ Studies/Results: No results found. Medications:  . amitriptyline  10 mg Oral QHS  . atorvastatin  80 mg Oral QHS  . calcium acetate  2,668 mg Oral TID WC  . Chlorhexidine Gluconate Cloth  6 each Topical Q0600  . Chlorhexidine Gluconate Cloth  6 each Topical Q0600  . cinacalcet  30 mg Oral QPM  . clopidogrel  75 mg Oral Daily  . collagenase   Topical BID  . darbepoetin (ARANESP) injection - DIALYSIS  40 mcg Intravenous Q Thu-HD  . feeding supplement (PRO-STAT SUGAR FREE 64)  30 mL Oral BID  . heparin  5,000 Units Subcutaneous Q8H  . insulin aspart  0-15 Units Subcutaneous TID WC  . insulin glargine  15 Units Subcutaneous QHS  . levothyroxine  125 mcg Oral Q0600  . lidocaine  1 patch Transdermal Q24H  . Melatonin  9 mg Oral QHS  . pantoprazole  40 mg Oral Daily  . polyethylene glycol  17 g Oral BID  . pregabalin  150 mg Oral TID  . risperiDONE  0.25 mg Oral Daily  . senna-docusate  2 tablet Oral QHS    Dialysis Orders: 4.5h 119.5kg 2/2.5 bath 400/600 LUE AVF Hep 2000 then 1K/hr Primary nephrologist: Dr. Lowanda Foster Epogen 1000 units 3x week Hectorol 0.29mcg IV 3x week Venofer 100 mg IV 3x week  Recent labs 11/25/2018: Hgb 10.0, Ca 9.1, Phos 4.9, Corr Ca 9.2, K 5.1  Assessment/Plan: 1. Ischemic R foot: S/p emergentR BKA4/20/2020, now in inpatient rehab.S/p course ofVanc/Zosyn.Pain improving.Continue rehab. 2. Constipation: Improving, bowel regimen per primary. 3. ESRD:Continue TTS schedule. K+ 4.5.Reports he cannot tolerate low BP, will keep systolic BP >630. Cr trending up slightly and Na 131  pre-HD. Will repeat BMP with HD tomorrow.  4. Hypertension/volume:BPoverall at goal. No edema on exam.Will continue to challenge EDW. Net UF 2.2L yesterday. Will likely need lower EDW at discharge. 5. Anemia:Hgb7.5,Aranesp40 mcg with HD yesterday. Will check iron panel. No bleeding reported. 6. Metabolic bone disease: Ca 9.2, corrected 10.6, VDRA held.Will use lower Ca bath.Phos7.0- trending up.Not always getting binder with snacks, reminded to ask for it. Continue phoslo binder, sensiparfor now. 7. Nutrition:Albuminvery low (2.2) - continue protein supplements. 8. Diabetes mellitus with neuropathy: Management per primary. On lyrica.  9. COPD:Lungs CTA, denies wheezing or cough. Continue albuterol prn.  10. Tremor:No tremor at present.Has reported intermittent tremor previously, not related to dialysis. Had been present for at least several months and not worsening. Reports he plans to follow up with PCP after discharge. Monitor.  [name], PA-C 12/12/2018, 12:47 PM  [facility] Pager: [phone]  Pt seen, examined and agree w A/P as above.  [facility] 12/12/2018, 2:53 PM

## 2018-12-12 NOTE — Progress Notes (Signed)
Physical Therapy Session Note  Patient Details  Name: [name] MRN: [id number] Date of Birth: Nov 24, 1950  Today's Date: 12/12/2018 PT Individual Time: 0950-1028 PT Individual Time Calculation (min): 38 min   Short Term Goals: Week 2:  PT Short Term Goal 1 (Week 2): Patient will perform bed<>chair transfers with supervision. PT Short Term Goal 2 (Week 2): Patient will be independent with HEP 2x per day. PT Short Term Goal 3 (Week 2): Patient will perform bed mobility independently with bed flat and without use of bed rail PT Short Term Goal 4 (Week 2): Patient will perform sit<>stand transfers with min A with LRAD for standing activities and exercise >2 min.   Skilled Therapeutic Interventions/Progress Updates:    pt rec'd in w/c and agreeable to therapy. Pt performs w/c mobility with supervision for parts management and propulsion x 100'.  Pt performs sliding board transfers to level surfaces with CGA.  Seated LAQ, hip add squeeze and push ups on push up blocks all 3 x 10.  Pt left in w/c with all needs at hand.   Therapy Documentation Precautions:  Precautions Precautions: Fall Restrictions Weight Bearing Restrictions: Yes RLE Weight Bearing: Non weight bearing Pain:  no c/o pain, meds given prior to session   Therapy/Group: Individual Therapy  Bow Buntyn 12/12/2018, 10:29 AM

## 2018-12-12 NOTE — Progress Notes (Signed)
Occupational Therapy Session Note  Patient Details  Name: [name] MRN: [id number] Date of Birth: [birth year]  Today's Date: 12/12/2018 OT Individual Time: 1430-1515 OT Individual Time Calculation (min): 45 min    Short Term Goals: Week 2:  OT Short Term Goal 1 (Week 2): STG=LTG 2/2 ELOS  Skilled Therapeutic Interventions/Progress Updates:    Patient seated edge of bed and ready for therapy session this afternoon.  He states that his shoulders are tight and he is having mild pain in residual limb.  Patient completed supine OH stretching and shoulder ROM exercises with good results.  Completed unsupported sitting UE stretching and reaching activities.  Completed core / posture and balance exercises with good seated balance.  Sit to stand from elevated bed x5 with CG A - patient able to maintain standing for 1-2 minutes each attempt.  Last attempt with bed lowered requiring min a to achieve standing.  Patient unable to let go of walker with one hand during static standing and unable to reposition left LE when in standing position.  Patient presents with good understanding of needs and good safety awareness.  He remained seated edge of bed at close of session with call bell/tray table and phone in reach.    Therapy Documentation Precautions:  Precautions Precautions: Fall Restrictions Weight Bearing Restrictions: Yes RLE Weight Bearing: Non weight bearing General:   Vital Signs: Therapy Vitals Temp: 97.7 F (36.5 C) Temp Source: Oral Pulse Rate: 73 Resp: 18 BP: 121/60 Patient Position (if appropriate): Sitting Oxygen Therapy SpO2: 100 % O2 Device: Room Air Pain: Pain Assessment Pain Scale: 0-10 Pain Score: 2    Other Treatments:     Therapy/Group: Individual Therapy  Carlos Levering 12/12/2018, 3:59 PM

## 2018-12-13 ENCOUNTER — Inpatient Hospital Stay (HOSPITAL_COMMUNITY): Payer: Medicare Other | Admitting: Occupational Therapy

## 2018-12-13 LAB — GLUCOSE, CAPILLARY
Glucose-Capillary: 131 mg/dL — ABNORMAL HIGH (ref 70–99)
Glucose-Capillary: 176 mg/dL — ABNORMAL HIGH (ref 70–99)
Glucose-Capillary: 142 mg/dL — ABNORMAL HIGH (ref 70–99)

## 2018-12-13 LAB — RENAL FUNCTION PANEL
Albumin: 2.1 g/dL — ABNORMAL LOW (ref 3.5–5.0)
Anion gap: 17 — ABNORMAL HIGH (ref 5–15)
BUN: 58 mg/dL — ABNORMAL HIGH (ref 8–23)
CO2: 24 mmol/L (ref 22–32)
Calcium: 9.2 mg/dL (ref 8.9–10.3)
Chloride: 88 mmol/L — ABNORMAL LOW (ref 98–111)
Creatinine, Ser: 8.38 mg/dL — ABNORMAL HIGH (ref 0.61–1.24)
GFR calc Af Amer: 7 mL/min — ABNORMAL LOW (ref >60)
GFR calc non Af Amer: 6 mL/min — ABNORMAL LOW (ref >60)
Glucose, Bld: 148 mg/dL — ABNORMAL HIGH (ref 70–99)
Phosphorus: 5.2 mg/dL — ABNORMAL HIGH (ref 2.5–4.6)
Potassium: 4.2 mmol/L (ref 3.5–5.1)
Sodium: 129 mmol/L — ABNORMAL LOW (ref 135–145)

## 2018-12-13 LAB — CBC
HCT: 23.3 % — ABNORMAL LOW (ref 39.0–52.0)
Hemoglobin: 7.5 g/dL — ABNORMAL LOW (ref 13.0–17.0)
MCH: 30.2 pg (ref 26.0–34.0)
MCHC: 32.2 g/dL (ref 30.0–36.0)
MCV: 94 fL (ref 80.0–100.0)
Platelets: 429 10*3/uL — ABNORMAL HIGH (ref 150–400)
RBC: 2.48 MIL/uL — ABNORMAL LOW (ref 4.22–5.81)
RDW: 16 % — ABNORMAL HIGH (ref 11.5–15.5)
WBC: 9 10*3/uL (ref 4.0–10.5)
nRBC: 0 % (ref 0.0–0.2)

## 2018-12-13 MED ORDER — HEPARIN SODIUM (PORCINE) 1000 UNIT/ML IJ SOLN
INTRAMUSCULAR | Status: AC
  Filled 2018-12-13: qty 4

## 2018-12-13 MED ORDER — SODIUM CHLORIDE 0.9 % IV SOLN
125.0000 mg | INTRAVENOUS | Status: DC
  Administered 2018-12-16 – 2018-12-18 (×2): 125 mg via INTRAVENOUS
  Filled 2018-12-13 (×4): qty 10

## 2018-12-13 MED ORDER — HEPARIN SODIUM (PORCINE) 1000 UNIT/ML DIALYSIS
4000.0000 [IU] | Freq: Once | INTRAMUSCULAR | Status: AC
  Administered 2018-12-13: 4000 [IU] via INTRAVENOUS_CENTRAL
  Filled 2018-12-13: qty 4

## 2018-12-13 MED ORDER — DARBEPOETIN ALFA 60 MCG/0.3ML IJ SOSY
60.0000 ug | PREFILLED_SYRINGE | INTRAMUSCULAR | Status: DC
  Filled 2018-12-13: qty 0.3

## 2018-12-13 NOTE — Progress Notes (Signed)
Occupational Therapy Session Note  Patient Details  Name: [name] MRN: [id number] Date of Birth: 1951/04/20  Today's Date: 12/13/2018 OT Individual Time:  - 0902-1000 OT Individual Treatment Time Calculation: 58 min     Skilled Therapeutic Interventions/Progress Updates:    Pt greeted EOB, receiving pain medication from RN. He wanted to wash up at the sink. Started tx with slideboard<w/c. Pt required min vcs for self setup, and supervision for transfer including inserting/removing board. He completed bathing with overall Min A excluding perihygiene (pt just didn't want to complete today). OT assisted with washing Lt foot and applying lotion due to dry skin. Educated pt on diabetic foot care and daily limb inspection. Provided him with [facility] mirror to use at [facility] and home for preventive care. Pt very receptive to education. We also discussed joining an amputee support group post d/c for psychosocial health and healing. He expressed greater interest in joining a support group pertaining to ESRD. Pt then retrieved leg rests from floor and applied both to w/c. Vcs required for locking w/c brakes prior to dynamic seated activity, and for orientation of Lt leg rest. He declined to complete oral hygiene, though OT provided encouragement to wash gums. At end of session pt was left in w/c with all needs within reach. Tx focus was placed on functional transfers, pt education, and functional activity tolerance.   Therapy Documentation Precautions:  Precautions Precautions: Fall Restrictions Weight Bearing Restrictions: Yes RLE Weight Bearing: Non weight bearing Vital Signs: Therapy Vitals Pulse Rate: 71 BP: 109/61 Pain: in residual limb. Premedicated at start of session    ADL:       Therapy/Group: Individual Therapy  Parrish Bonn A Richrd Kuzniar 12/13/2018, 4:40 PM

## 2018-12-13 NOTE — Progress Notes (Addendum)
Subjective:   No cos on hd   Objective Vital signs in last 24 hours: Vitals:   12/12/18 0520 12/12/18 1545 12/12/18 1927 12/13/18 0541  BP: 135/63 121/60 133/64 (!) 138/59  Pulse: 68 73 72 75  Resp: 16 18 16 18   Temp: (!) 97.5 F (36.4 C) 97.7 F (36.5 C) 97.8 F (36.6 C) 97.7 F (36.5 C)  TempSrc: Oral Oral Oral   SpO2: 97% 100% 97% 92%  Weight: 119.1 kg     Height:       Weight change:   Physical Exam General:on HD ,NAD Well developed male, alert and in NAD Heart:RRR, no rub or gallop  Lungs:CTA  bilat. Respirations even and unlabored.  Abdomen:Soft, non-tender. Non-distended. + BS Extremities:No pedal edema b/l lower extremities. S/p R BKA Dialysis Access:LUE AVF patent on HD   Dialysis Orders: 4.5h 119.5kg 2/2.5 bath 400/600 LUE AVF Hep 2000 then 1K/hr Primary nephrologist: Dr. Lowanda Foster Epogen 1000 units 3x week Hectorol 0.30mcg IV 3x week Venofer 100 mg IV 3x week  Recent labs 11/25/2018: Hgb 10.0, Ca 9.1, Phos 4.9, Corr Ca 9.2, K 5.1  Problem/Plan: 1. 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 2. ESRD:Continue TTS schedule.Reports he cannot tolerate low BP, will keep systolic BP >564. 3. Hypertension/volume:BPoverall at goal. No edema on exam.Will continue to challenge EDW.  Will likely need lower EDW at discharge. 4. Anemia:Hgb7.5,Aranesp40 mcg with HD yesterday. Will check iron panel. TFS 21%  Venofer 100mg  x 3 /incr Aranesp 60 next time No bleeding reported. 5. Metabolic bone disease: Ca 9.2, corrected 10.6, VDRA held.Will use lower Ca bath.Phos7.0- trending up.Not always getting binder with snacks, reminded to ask for it. Continue phoslo binder, sensiparfor now. 6. Nutrition:Albuminvery low (2.2) - continue protein supplements. 7. Diabetes mellitus with neuropathy: Management per primary. On lyrica.  8. COPD:Lungs CTA, denies wheezing or cough. Continue albuterol prn.  Clayton Haber,  PA-C [facility] Beeper [phone] 12/13/2018,12:15 PM  LOS: 10 days   Pt seen, examined and agree w A/P as above.  [name] 12/13/2018, 1:03 PM    Labs: Basic Metabolic Panel: Recent Labs  Lab 12/06/18 1729 12/11/18 1417  NA 132* 131*  K 4.5 4.5  CL 92* 90*  CO2 24 23  GLUCOSE 107* 113*  BUN 61* 66*  CREATININE 9.74* 9.76*  CALCIUM 8.5* 9.2  PHOS 6.2* 7.0*   Liver Function Tests: Recent Labs  Lab 12/06/18 1729 12/11/18 1417  ALBUMIN 2.0* 2.2*   No results for input(s): LIPASE, AMYLASE in the last 168 hours. No results for input(s): AMMONIA in the last 168 hours. CBC: Recent Labs  Lab 12/06/18 1729 12/11/18 1425  WBC 9.1 9.6  NEUTROABS  --  6.9  HGB 7.9* 7.5*  HCT 25.1* 24.2*  MCV 96.5 95.7  PLT 347 379   Cardiac Enzymes: No results for input(s): CKTOTAL, CKMB, CKMBINDEX, TROPONINI in the last 168 hours. CBG: Recent Labs  Lab 12/12/18 0802 12/12/18 1144 12/12/18 1655 12/12/18 2125 12/13/18 0633  GLUCAP 104* 118* 104* 125* 131*    Studies/Results: No results found. Medications:  . amitriptyline  10 mg Oral QHS  . atorvastatin  80 mg Oral QHS  . calcium acetate  2,668 mg Oral TID WC  . Chlorhexidine Gluconate Cloth  6 each Topical Q0600  . Chlorhexidine Gluconate Cloth  6 each Topical Q0600  . Chlorhexidine Gluconate Cloth  6 each Topical Q0600  . cinacalcet  30 mg Oral QPM  . clopidogrel  75 mg Oral Daily  . collagenase   Topical BID  . darbepoetin (ARANESP) injection - DIALYSIS  40 mcg Intravenous Q Thu-HD  . feeding supplement (PRO-STAT SUGAR FREE 64)  30 mL Oral BID  . heparin  5,000 Units Subcutaneous Q8H  . insulin aspart  0-15 Units Subcutaneous TID WC  . insulin glargine  15 Units Subcutaneous QHS  . levothyroxine  125 mcg Oral Q0600  . lidocaine  1 patch Transdermal Q24H  . Melatonin  9 mg Oral QHS  . pantoprazole  40 mg Oral Daily  . polyethylene glycol  17 g Oral BID  . pregabalin  150 mg  Oral TID  . risperiDONE  0.25 mg Oral Daily  . senna-docusate  2 tablet Oral QHS

## 2018-12-13 NOTE — Progress Notes (Signed)
[facility] PHYSICAL MEDICINE & REHABILITATION PROGRESS NOTE  Subjective/Complaints: Patient seen laying in bed this morning.  He states he slept well overnight.  He states he had a good bowel movement yesterday.  He has questions regarding dressing changes.  ROS: Denies CP, shortness of breath, nausea, vomiting, diarrhea.  Objective: Vital Signs: Blood pressure (!) 138/59, pulse 75, temperature 97.7 F (36.5 C), resp. rate 18, height 6\' 4"  (1.93 m), weight 119.1 kg, SpO2 92 %. No results found. Recent Labs    12/11/18 1425  WBC 9.6  HGB 7.5*  HCT 24.2*  PLT 379   Recent Labs    12/11/18 1417  NA 131*  K 4.5  CL 90*  CO2 23  GLUCOSE 113*  BUN 66*  CREATININE 9.76*  CALCIUM 9.2    Physical Exam: BP (!) 138/59 (BP Location: Right Arm)   Pulse 75   Temp 97.7 F (36.5 C)   Resp 18   Ht 6\' 4"  (1.93 m)   Wt 119.1 kg   SpO2 92%   BMI 31.96 kg/m  Constitutional: No distress . Vital signs reviewed. Obese. HENT: Normocephalic.  Atraumatic. Eyes: EOMI.  No discharge. Cardiovascular: No JVD. Respiratory: Normal effort. GI: Non-distended. Musc: Right BKA with edema and tenderness, unchanged Neurological: He is alert and oriented.  Patient is alert and follows full commands.  Motor:  Left lower extremity: Hip flexion, knee extension 5/5, ankle dorsiflexion 4/5, stable Right lower extremity: 4/5 hip flexion, knee extension (pain inhibition), stable Skin: Amputation site with dressing C/D/I Left plantar heel ulcer with eschar, not examined today Psychiatric: He has a normal mood and affect. His behavior is normal.    Assessment/Plan: 1. Functional deficits secondary to right BKA which require 3+ hours per day of interdisciplinary therapy in a comprehensive inpatient rehab setting.  Physiatrist is providing close team supervision and 24 hour management of active medical problems listed below.  Physiatrist and rehab team continue to assess barriers to discharge/monitor  patient progress toward functional and medical goals  Care Tool:  Bathing  Bathing activity did not occur: Refused(refused to attempt LB bathing, unable to lift buttocks off w/c with max assist) Body parts bathed by patient: Right arm, Left arm, Chest, Abdomen, Right upper leg, Left upper leg, Left lower leg, Face     Body parts n/a: Right lower leg   Bathing assist Assist Level: Minimal Assistance - Patient > 75%     Upper Body Dressing/Undressing Upper body dressing Upper body dressing/undressing activity did not occur (including orthotics): N/A What is the patient wearing?: Pull over shirt    Upper body assist Assist Level: Independent    Lower Body Dressing/Undressing Lower body dressing    Lower body dressing activity did not occur: Refused What is the patient wearing?: Underwear/pull up     Lower body assist Assist for lower body dressing: Moderate Assistance - Patient 50 - 74%     Toileting Toileting Toileting Activity did not occur Landscape architect and hygiene only): Safety/medical concerns  Toileting assist Assist for toileting: Moderate Assistance - Patient 50 - 74%     Transfers Chair/bed transfer  Transfers assist  Chair/bed transfer activity did not occur: N/A  Chair/bed transfer assist level: Maximal Assistance - Patient 25 - 49%(stand pivot with RW)     Locomotion Ambulation   Ambulation assist   Ambulation activity did not occur: Safety/medical concerns(pt generalized weakness)          Walk 10 feet activity   Assist  Walk  10 feet activity did not occur: N/A        Walk 50 feet activity   Assist Walk 50 feet with 2 turns activity did not occur: N/A         Walk 150 feet activity   Assist Walk 150 feet activity did not occur: N/A         Walk 10 feet on uneven surface  activity   Assist Walk 10 feet on uneven surfaces activity did not occur: Safety/medical concerns         Wheelchair     Assist Will  patient use wheelchair at discharge?: Yes Type of Wheelchair: Manual Wheelchair activity did not occur: N/A  Wheelchair assist level: Supervision/Verbal cueing Max wheelchair distance: 10'    Wheelchair 50 feet with 2 turns activity    Assist    Wheelchair 50 feet with 2 turns activity did not occur: N/A   Assist Level: Supervision/Verbal cueing   Wheelchair 150 feet activity     Assist Wheelchair 150 feet activity did not occur: N/A   Assist Level: Supervision/Verbal cueing      Medical Problem List and Plan: 1.   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 Antithrombotics: -DVT/anticoagulation:  Subcutaneous heparin due to              -antiplatelet therapy:  Plavix 75 mg daily 3. Pain Management:   Phantom limb pain improving  Lyrica 150 mg 3 times a day, Elavil 10 started on 4/23  Lidoderm patch ordered on 4/23  Oxycodone as needed             Encourage desensitization techniques, mirror therapy  Relatively controlled on 5/2 4. Mood:             -antipsychotic agents: Risperdal 0.25 mg daily 5. Neuropsych: This patient is capable of making decisions on his own behalf. 6. Skin/Wound Care: Routine skin care  Continue Santyl Santyl for left plantar heel ulcer  Patient refusing PRAFO, states it makes him claustrophobic 7. Fluids/Electrolytes/Nutrition:  Routine I/Os.   8. Acute on chronic anemia.   Hb 7.5 on 4/30  Iron studies showing mildly decreased TIBC  Labs with HD  Cont to monitor 9. End-stage renal disease. Continue hemodialysis 10. Diabetes mellitus and peripheral neuropathy. HbA1c 11.2 on 4/16.   Lantus insulin 85 units daily at bedtime, decreased to 70 on 4/27, decreased to 55 on 4/29, decreased to 15 on 5/1.   May need to increase in future as I suspect blood sugars will trend up  Diabetic teaching.     CBG (last 3)  Recent Labs     12/12/18 1655 12/12/18 2125 12/13/18 0633  GLUCAP 104* 125* 131*   Relatively controlled on 5/2  Monitor with increased mobility 11. Hypertension.   Vitals:   12/12/18 1927 12/13/18 0541  BP: 133/64 (!) 138/59  Pulse: 72 75  Resp: 16 18  Temp: 97.8 F (36.6 C) 97.7 F (36.5 C)  SpO2: 97% 92%   Relatively controlled on 5/2  Recs per nephro  Monitor with increased mobility 12. COPD with remote tobacco abuse. Continue nebulizer as needed 13. Hyperlipidemia. Lipitor 14. Hypothyroidism. Synthroid 15. GERD. Protonix 16.  Drug-induced constipation. Laxative assistance  Bowel regimen increased on 4/28  Suppository ordered on 4/29  Improving 17.  Sleep disturbance  Melatonin 9 mg daily at bedtime  Trazodone d/ced  Elavil started on 4/23  Improving  LOS: 10  days A FACE TO FACE EVALUATION WAS PERFORMED  [name] Lorie Phenix 12/13/2018, 9:59 AM

## 2018-12-14 ENCOUNTER — Inpatient Hospital Stay (HOSPITAL_COMMUNITY): Payer: Medicare Other

## 2018-12-14 LAB — GLUCOSE, CAPILLARY
Glucose-Capillary: 111 mg/dL — ABNORMAL HIGH (ref 70–99)
Glucose-Capillary: 172 mg/dL — ABNORMAL HIGH (ref 70–99)
Glucose-Capillary: 157 mg/dL — ABNORMAL HIGH (ref 70–99)
Glucose-Capillary: 165 mg/dL — ABNORMAL HIGH (ref 70–99)

## 2018-12-14 NOTE — Progress Notes (Signed)
[place] PHYSICAL MEDICINE & REHABILITATION PROGRESS NOTE  Subjective/Complaints: Patient seen sitting up in bed this morning.  He states he slept well overnight.  He notes he had a bowel movement yesterday.  He notes improvement in CBGs.  ROS: Denies CP, shortness of breath, nausea, vomiting, diarrhea.  Objective: Vital Signs: Blood pressure (!) 133/57, pulse 68, temperature 98.2 F (36.8 C), temperature source Oral, resp. rate 18, height 6\' 4"  (1.93 m), weight 118.7 kg, SpO2 99 %. No results found. Recent Labs    12/11/18 1425 12/13/18 1229  WBC 9.6 9.0  HGB 7.5* 7.5*  HCT 24.2* 23.3*  PLT 379 429*   Recent Labs    12/11/18 1417 12/13/18 1230  NA 131* 129*  K 4.5 4.2  CL 90* 88*  CO2 23 24  GLUCOSE 113* 148*  BUN 66* 58*  CREATININE 9.76* 8.38*  CALCIUM 9.2 9.2    Physical Exam: BP (!) 133/57 (BP Location: Right Arm)   Pulse 68   Temp 98.2 F (36.8 C) (Oral)   Resp 18   Ht 6\' 4"  (1.93 m)   Wt 118.7 kg   SpO2 99%   BMI 31.85 kg/m  Constitutional: No distress . Vital signs reviewed. Obese. HENT: Normocephalic.  Atraumatic. Eyes: EOMI.  No discharge. Cardiovascular: No JVD. Respiratory: Normal effort. GI: Non-distended. Musc: Right BKA with edema and tenderness, stable Neurological: He is alert and oriented.  Patient is alert and follows full commands.  Motor:  Left lower extremity: Hip flexion, knee extension 5/5, ankle dorsiflexion 4/5, unchanged Right lower extremity: 4/5 hip flexion, knee extension (pain inhibition), unchanged Skin: Amputation site with dressing C/D/I Left plantar heel ulcer with eschar, not examined today Psychiatric: He has a normal mood and affect. His behavior is normal.    Assessment/Plan: 1. Functional deficits secondary to right BKA which require 3+ hours per day of interdisciplinary therapy in a comprehensive inpatient rehab setting.  Physiatrist is providing close team supervision and 24 hour management of active  medical problems listed below.  Physiatrist and rehab team continue to assess barriers to discharge/monitor patient progress toward functional and medical goals  Care Tool:  Bathing  Bathing activity did not occur: Refused(refused to attempt LB bathing, unable to lift buttocks off w/c with max assist) Body parts bathed by patient: Right arm, Left arm, Chest, Abdomen, Right upper leg, Left upper leg, Left lower leg, Face     Body parts n/a: Right lower leg   Bathing assist Assist Level: Minimal Assistance - Patient > 75%     Upper Body Dressing/Undressing Upper body dressing Upper body dressing/undressing activity did not occur (including orthotics): N/A What is the patient wearing?: Pull over shirt    Upper body assist Assist Level: Independent    Lower Body Dressing/Undressing Lower body dressing    Lower body dressing activity did not occur: Refused What is the patient wearing?: Underwear/pull up     Lower body assist Assist for lower body dressing: Moderate Assistance - Patient 50 - 74%     Toileting Toileting Toileting Activity did not occur Landscape architect and hygiene only): Safety/medical concerns  Toileting assist Assist for toileting: Moderate Assistance - Patient 50 - 74%     Transfers Chair/bed transfer  Transfers assist  Chair/bed transfer activity did not occur: N/A  Chair/bed transfer assist level: Maximal Assistance - Patient 25 - 49%(stand pivot with RW)     Locomotion Ambulation   Ambulation assist   Ambulation activity did not occur: Safety/medical concerns(pt generalized  weakness)          Walk 10 feet activity   Assist  Walk 10 feet activity did not occur: N/A        Walk 50 feet activity   Assist Walk 50 feet with 2 turns activity did not occur: N/A         Walk 150 feet activity   Assist Walk 150 feet activity did not occur: N/A         Walk 10 feet on uneven surface  activity   Assist Walk 10 feet on  uneven surfaces activity did not occur: Safety/medical concerns         Wheelchair     Assist Will patient use wheelchair at discharge?: Yes Type of Wheelchair: Manual Wheelchair activity did not occur: N/A  Wheelchair assist level: Supervision/Verbal cueing Max wheelchair distance: 10'    Wheelchair 50 feet with 2 turns activity    Assist    Wheelchair 50 feet with 2 turns activity did not occur: N/A   Assist Level: Supervision/Verbal cueing   Wheelchair 150 feet activity     Assist Wheelchair 150 feet activity did not occur: N/A   Assist Level: Supervision/Verbal cueing      Medical Problem List and Plan: 1.   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 Antithrombotics: -DVT/anticoagulation:  Subcutaneous heparin due to              -antiplatelet therapy:  Plavix 75 mg daily 3. Pain Management:   Phantom limb pain improving  Lyrica 150 mg 3 times a day, Elavil 10 started on 4/23  Lidoderm patch ordered on 4/23  Oxycodone as needed             Encourage desensitization techniques, mirror therapy  Relatively controlled on 5/3 4. Mood:             -antipsychotic agents: Risperdal 0.25 mg daily 5. Neuropsych: This patient is capable of making decisions on his own behalf. 6. Skin/Wound Care: Routine skin care  Continue Santyl Santyl for left plantar heel ulcer  Patient refusing PRAFO, states it makes him claustrophobic 7. Fluids/Electrolytes/Nutrition:  Routine I/Os.   8. Acute on chronic anemia.   Hb 7.5 on 5/2  Iron studies showing mildly decreased TIBC  Labs with HD  Cont to monitor 9. End-stage renal disease. Continue hemodialysis 10. Diabetes mellitus and peripheral neuropathy. HbA1c 11.2 on 4/16.   Lantus insulin 85 units daily at bedtime, decreased to 70 on 4/27, decreased to 55 on 4/29, decreased to 15 on 5/1.   May need to increase in  future as I suspect blood sugars will trend up  Diabetic teaching.     CBG (last 3)  Recent Labs    12/13/18 1648 12/13/18 2115 12/14/18 0616  GLUCAP 176* 142* 111*   Labile on 5/3  Monitor with increased mobility 11. Hypertension.   Vitals:   12/13/18 1959 12/14/18 0452  BP: (!) 155/72 (!) 133/57  Pulse: 76 68  Resp:  18  Temp:  98.2 F (36.8 C)  SpO2:  99%   Labile on 5/3  Recs per nephro  Monitor with increased mobility 12. COPD with remote tobacco abuse. Continue nebulizer as needed 13. Hyperlipidemia. Lipitor 14. Hypothyroidism. Synthroid 15. GERD. Protonix 16.  Drug-induced constipation. Laxative assistance  Bowel regimen increased on 4/28  Suppository ordered on 4/29  Improving 17.  Sleep disturbance  Melatonin 9 mg daily  at bedtime  Trazodone d/ced  Elavil started on 4/23  Improving  LOS: 11 days A FACE TO FACE EVALUATION WAS PERFORMED  Albertine Lafoy Lorie Phenix 12/14/2018, 8:26 AM

## 2018-12-14 NOTE — Progress Notes (Addendum)
Physical Therapy Session Note  Patient Details  Name: [name] MRN: [id number] Date of Birth: February 26, 1951  Today's Date: 12/14/2018 PT Individual Time: 1400-1500 PT Individual Time Calculation (min): 60 min   Short Term Goals:  Week 2:  PT Short Term Goal 1 (Week 2): Patient will perform bed<>chair transfers with supervision. PT Short Term Goal 2 (Week 2): Patient will be independent with HEP 2x per day. PT Short Term Goal 3 (Week 2): Patient will perform bed mobility independently with bed flat and without use of bed rail PT Short Term Goal 4 (Week 2): Patient will perform sit<>stand transfers with min A with LRAD for standing activities and exercise >2 min.   Skilled Therapeutic Interventions/Progress Updates:   Pt resting in bed.  Bed mobility using bed rail, supervision.  PT donned L TED and pt donned non- slip sock.  ACE and retention sock donned already on R residual limb.  Slide board level transfer bed> w/c with supervision, without cues.    Pt reported that he has a w/c, that he thinks he got in 2017.  He has used it very little.  Plan to check with wife on w/c.  W/c propulsion bil UEs x 100' with supervison.  W/c> simulated car transfer using slide board, seat at 24" height (pt has a [employer]ia Sorento) with cues for w/c placement, otherwise supervision without cues. Pt managed bil leg rests on/off w/c with 1 cue for safety, to lock brakes before handling them.  Seated Therapeutic exercises performed with LE to increase strength for functional mobility: R knee short arc quad knee extension. Using hand out: 10 x 1 each-Supine L unilateral bridging, R isometric knee extension with towel roll, bil adductor squeeze with towel roll ; L side lying for R hip abduction .  W/c> mat to L, level slide board, without cues.  Pt able to leave w/c as is, and roll and scoot into supine, to simulate home situation of leaving w/c in place next to bed.  Mat> w/c transfer as above.  Pt able to  place SB back in tote bag with supervision.    Pt left resting in w/c with needs at hand.  He later reported that seat ht of his SUV is 26".  He inquired if he would be able to get a transport chair, which would fit into his BR (he knows his w/c does not).  PT will ask CSW to follow up on eligibility for transport chair.  PT gently suggested that pt may have to use BSC outside of BR for now; he was receptive.       Therapy Documentation Precautions:  Precautions Precautions: Fall Restrictions Weight Bearing Restrictions: Yes RLE Weight Bearing: Non weight bearing General:   Vital Signs: Therapy Vitals Temp: 97.6 F (36.4 C) Temp Source: Oral Pulse Rate: 68 Resp: 18 BP: (!) 142/64 Patient Position (if appropriate): Sitting Oxygen Therapy SpO2: 100 % O2 Device: Room Air Pain: Pain Assessment Pain Scale: 0-10 Pain Score: 0-No pain Pain Type: Surgical pain Pain Location: Leg Pain Orientation: Right Pain Descriptors / Indicators: Aching Pain Frequency: Constant Pain Onset: On-going Pain Intervention(s): Medication (See eMAR) Mobility:   Locomotion :    Trunk/Postural Assessment :    Balance:   Exercises:   Other Treatments:      Therapy/Group: Individual Therapy  Julitza Rickles 12/14/2018, 3:17 PM

## 2018-12-14 NOTE — Progress Notes (Addendum)
Subjective:  Hd yest tolerated 3 l uf, no cos this am , noted 5 sprite cans on table,  stressed vol compliance .  Objective Vital signs in last 24 hours: Vitals:   12/13/18 1756 12/13/18 1936 12/13/18 1959 12/14/18 0452  BP: 128/76 (!) 160/62 (!) 155/72 (!) 133/57  Pulse: 78 70 76 68  Resp: 18 18  18   Temp: 97.6 F (36.4 C) 98 F (36.7 C)  98.2 F (36.8 C)  TempSrc: Oral Tympanic  Oral  SpO2: 97% 98%  99%  Weight:      Height:       Weight change:   Physical Exam General:Alert obese,Well developed male,  NAD Heart:RRR, no rub or gallop  Lungs:CTA  bilat. and unlabored.  Abdomen:Soft, + bs ,obese ,non-tender. Non-distended.  Extremities:Trace L LLE pedal edema ,. S/p R BKA Dressing dry Dialysis Access:LUE AVF + bruit    Dialysis Orders: 4.5h 119.5kg 2/2.5 bath 400/600 LUE AVF Hep 2000 then 1K/hr Primary nephrologist: Dr. Leane Call [facility]  Epogen 1000 units 3x week Hectorol 0.46mcg IV 3x week Venofer 100 mg IV 3x week  Recent labs 11/25/2018: Hgb 10.0, Ca 9.1, Phos 4.9, Corr Ca 9.2, K 5.1  Problem/Plan: 1. 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 2. ESRD:Continue TTS schedule.Reports he cannot tolerate low BP, will keep systolic BP >938 Reminded needing  RECLINER  HD next Week  With plans for possible dc home  3. Hypertension/volume:Yest 3l uf tolerated  Post wt BED WT 118.7 BPoverall at goal.Will continue to challenge EDW.Will likely need lower EDW at discharge.STRESSED VOL Compliance with diet  4. Anemia:Hgb7.5,Aranesp40 mcg with HD4/30  With   iron panel= TFS 21%  Venofer 100mg  x 3 /incr Aranesp 60 next time, No bleeding reported. 5. Metabolic bone disease:Yest  CA , corrected10.6, VDRA on  Hold .Will use lower Ca bath.phos was 5.2  Prior 7.0 on 2 binders in hosp.!! Not always getting binder with snacks, reminded to ask for it.Also on Sensipar 30 mg  q day 6. Nutrition:Albuminvery low  (2.2) - continue protein supplements. 7. Diabetes mellitus with neuropathy: Management per primary. On lyrica 150 mg tid .Noted Elavil 10 mg also for phantom pain 8. COPD:Lungs CTA, denies wheezing or cough. Continue albuterol prn.  Ernest Haber, PA-C [facility]per [phone] 12/14/2018,9:28 AM  LOS: 11 days   Pt seen, examined and agree w A/P as above.  [facility] 12/14/2018, 12:56 PM    Labs: Basic Metabolic Panel: Recent Labs  Lab 12/11/18 1417 12/13/18 1230  NA 131* 129*  K 4.5 4.2  CL 90* 88*  CO2 23 24  GLUCOSE 113* 148*  BUN 66* 58*  CREATININE 9.76* 8.38*  CALCIUM 9.2 9.2  PHOS 7.0* 5.2*   Liver Function Tests: Recent Labs  Lab 12/11/18 1417 12/13/18 1230  ALBUMIN 2.2* 2.1*   No results for input(s): LIPASE, AMYLASE in the last 168 hours. No results for input(s): AMMONIA in the last 168 hours. CBC: Recent Labs  Lab 12/11/18 1425 12/13/18 1229  WBC 9.6 9.0  NEUTROABS 6.9  --   HGB 7.5* 7.5*  HCT 24.2* 23.3*  MCV 95.7 94.0  PLT 379 429*   Cardiac Enzymes: No results for input(s): CKTOTAL, CKMB, CKMBINDEX, TROPONINI in the last 168 hours. CBG: Recent Labs  Lab 12/12/18 2125 12/13/18 0633 12/13/18 1648 12/13/18 2115 12/14/18 0616  GLUCAP 125* 131* 176* 142* 111*    Studies/Results: No results found. Medications: . [START  ON 12/16/2018] ferric gluconate (FERRLECIT/NULECIT) IV     . amitriptyline  10 mg Oral QHS  . atorvastatin  80 mg Oral QHS  . calcium acetate  2,668 mg Oral TID WC  . Chlorhexidine Gluconate Cloth  6 each Topical Q0600  . Chlorhexidine Gluconate Cloth  6 each Topical Q0600  . Chlorhexidine Gluconate Cloth  6 each Topical Q0600  . cinacalcet  30 mg Oral QPM  . clopidogrel  75 mg Oral Daily  . collagenase   Topical BID  . [START ON 12/18/2018] darbepoetin (ARANESP) injection - DIALYSIS  60 mcg Intravenous Q Thu-HD  . feeding supplement (PRO-STAT SUGAR FREE 64)  30 mL Oral BID  .  heparin  5,000 Units Subcutaneous Q8H  . insulin aspart  0-15 Units Subcutaneous TID WC  . insulin glargine  15 Units Subcutaneous QHS  . levothyroxine  125 mcg Oral Q0600  . lidocaine  1 patch Transdermal Q24H  . Melatonin  9 mg Oral QHS  . pantoprazole  40 mg Oral Daily  . polyethylene glycol  17 g Oral BID  . pregabalin  150 mg Oral TID  . risperiDONE  0.25 mg Oral Daily  . senna-docusate  2 tablet Oral QHS

## 2018-12-15 ENCOUNTER — Inpatient Hospital Stay (HOSPITAL_COMMUNITY): Payer: Medicare Other | Admitting: Occupational Therapy

## 2018-12-15 ENCOUNTER — Inpatient Hospital Stay (HOSPITAL_COMMUNITY): Payer: Medicare Other

## 2018-12-15 LAB — GLUCOSE, CAPILLARY
Glucose-Capillary: 148 mg/dL — ABNORMAL HIGH (ref 70–99)
Glucose-Capillary: 165 mg/dL — ABNORMAL HIGH (ref 70–99)
Glucose-Capillary: 142 mg/dL — ABNORMAL HIGH (ref 70–99)
Glucose-Capillary: 166 mg/dL — ABNORMAL HIGH (ref 70–99)

## 2018-12-15 NOTE — Progress Notes (Signed)
Orthopedic Tech Progress Note Patient Details:  [name] June 10, 1951 [id number] Called in order to [facility] for  Stump shinkers Patient ID: [name], male   DOB: Jan 01, 1951, 68 y.o.   MRN: [id number]   [name] 12/15/2018, 12:35 PM

## 2018-12-15 NOTE — Progress Notes (Addendum)
Subjective:  Seen in rm with OT  , no cos for hd tomor.  Objective Vital signs in last 24 hours: Vitals:   12/14/18 0452 12/14/18 1348 12/14/18 2004 12/15/18 0453  BP: (!) 133/57 (!) 142/64 (!) 147/68 (!) 145/67  Pulse: 68 68 69 68  Resp: 18 18 14 18   Temp: 98.2 F (36.8 C) 97.6 F (36.4 C) 97.6 F (36.4 C) 98.6 F (37 C)  TempSrc: Oral Oral Oral Oral  SpO2: 99% 100% 100% 93%  Weight:      Height:       Weight change:   Physical Exam General:Alert obese,Well developed male,  NAD Heart:RRR,no rub or gallop Lungs:CTA bilat. and unlabored.  Abdomen:Soft, + bs ,obese ,non-tender. Non-distended.  Extremities:Trace L LLEpedaledema ,. S/p R BKA Dressing dry Dialysis Access:LUE AVF+ bruit   Dialysis Orders: 4.5h 119.5kg 2/2.5 bath 400/600 LUE AVF Hep 2000 then 1K/hr Primary nephrologist: Dr. Leane Call [facility]  Epogen 1000 units 3x week Hectorol 0.68mcg IV 3x week Venofer 100 mg IV 3x week  Recent labs 11/25/2018: Hgb 10.0, Ca 9.1, Phos 4.9, Corr Ca 9.2, K 5.1  Problem/Plan: 1. 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 2. ESRD:Continue TTS schedule.Reports he cannot tolerate low BP, will keep systolic BP >425 Reminded needing  RECLINER  HD next HD  With plans for possible dc home ?this friday 3. Hypertension/volume:last hd  3l uf tolerated  Post wt BED WT 118.7 BPoverall at goal.Will continue to challenge EDW.Will likely need lower EDW at discharge.STRESSED VOL Compliance with diet  4. Anemia:Hgb7.5,Aranesp40 mcg with HD4/30  With   iron panel= TFS 21% Venofer 100mg  x 3 /incr Aranesp 60 next time, No bleeding reported. 5. Metabolic bone disease:Yest  CA , corrected10.6, VDRA on  Hold .Will use lower Ca bath.phos was 5.2  Prior 7.0 on 2 binders in hosp.!! Not always getting binder with snacks, reminded to ask for it.Also on Sensipar 30 mg  q day 6. Nutrition:Albuminvery low (2.1) - continue  protein supplements. 7. Diabetes mellitus with neuropathy: Management per primary. On lyrica 150 mg tid .Noted Elavil 10 mg also for phantom pain 8. COPD:Lungs CTA, denies wheezing or cough. Continue albuterol prn.  Ernest Haber, PA-C [facility] [phone] 12/15/2018,1:43 PM  LOS: 12 days   Pt seen, examined and agree w A/P as above.  [facility] 12/15/2018, 2:10 PM    Labs: Basic Metabolic Panel: Recent Labs  Lab 12/11/18 1417 12/13/18 1230  NA 131* 129*  K 4.5 4.2  CL 90* 88*  CO2 23 24  GLUCOSE 113* 148*  BUN 66* 58*  CREATININE 9.76* 8.38*  CALCIUM 9.2 9.2  PHOS 7.0* 5.2*   Liver Function Tests: Recent Labs  Lab 12/11/18 1417 12/13/18 1230  ALBUMIN 2.2* 2.1*   No results for input(s): LIPASE, AMYLASE in the last 168 hours. No results for input(s): AMMONIA in the last 168 hours. CBC: Recent Labs  Lab 12/11/18 1425 12/13/18 1229  WBC 9.6 9.0  NEUTROABS 6.9  --   HGB 7.5* 7.5*  HCT 24.2* 23.3*  MCV 95.7 94.0  PLT 379 429*   Cardiac Enzymes: No results for input(s): CKTOTAL, CKMB, CKMBINDEX, TROPONINI in the last 168 hours. CBG: Recent Labs  Lab 12/14/18 1234 12/14/18 1633 12/14/18 2108 12/15/18 0618 12/15/18 1124  GLUCAP 172* 157* 165* 148* 165*    Studies/Results: No results found. Medications: . [START ON 12/16/2018] ferric gluconate (FERRLECIT/NULECIT) IV     .  amitriptyline  10 mg Oral QHS  . atorvastatin  80 mg Oral QHS  . calcium acetate  2,668 mg Oral TID WC  . Chlorhexidine Gluconate Cloth  6 each Topical Q0600  . cinacalcet  30 mg Oral QPM  . clopidogrel  75 mg Oral Daily  . collagenase   Topical BID  . [START ON 12/18/2018] darbepoetin (ARANESP) injection - DIALYSIS  60 mcg Intravenous Q Thu-HD  . feeding supplement (PRO-STAT SUGAR FREE 64)  30 mL Oral BID  . heparin  5,000 Units Subcutaneous Q8H  . insulin aspart  0-15 Units Subcutaneous TID WC  . insulin glargine  15 Units Subcutaneous  QHS  . levothyroxine  125 mcg Oral Q0600  . lidocaine  1 patch Transdermal Q24H  . Melatonin  9 mg Oral QHS  . pantoprazole  40 mg Oral Daily  . polyethylene glycol  17 g Oral BID  . pregabalin  150 mg Oral TID  . risperiDONE  0.25 mg Oral Daily  . senna-docusate  2 tablet Oral QHS

## 2018-12-15 NOTE — Progress Notes (Signed)
Social Work Patient ID: [name], male   DOB: 1951-04-20, 68 y.o.   MRN: [id number] Spoke with wife who will be here Wed to go through therapies with pt along with their daughter. She will also bring in pt's WC to see if it is appropriate for him. Work on discharge needs.

## 2018-12-15 NOTE — Progress Notes (Signed)
Physical Therapy Session Note  Patient Details  Name: [name] MRN: [id number] Date of Birth: 1951/07/06  Today's Date: 12/15/2018 PT Individual Time: 1230-1345 PT Individual Time Calculation (min): 75 min   Short Term Goals:  Week 2:  PT Short Term Goal 1 (Week 2): Patient will perform bed<>chair transfers with supervision. PT Short Term Goal 2 (Week 2): Patient will be independent with HEP 2x per day. PT Short Term Goal 3 (Week 2): Patient will perform bed mobility independently with bed flat and without use of bed rail PT Short Term Goal 4 (Week 2): Patient will perform sit<>stand transfers with min A with LRAD for standing activities and exercise >2 min.   Skilled Therapeutic Interventions/Progress Updates:   Pt seated In w/c. R amputee pad noted to be tilted up, with pt in knee flexion and resultant wt bearing slightly on end of residual limb.  PT educated pt and demonstrated how this is unsafe for his skin and healing; he expressed understanding after extensive discussion. Pain rated 4/10 R residual limb, premedicated.  PT discussed pt's present personal w/c.  He stated that he weighed significan'ty less when he received the wc in 2017.  PT asked for him to have wife measure it, and/or bring it in on day of family ed. He stated that his wife and dtr can load wc into his SUV.  Pt also reported that his HD clinic requires him to get into a high recliner and that he will need a Hoyer sling so that a Harrel Lemon can be used for HD.  Pt very upset about this.  Seated Therapeutic exercise performed with LE to increase strength for functional mobility: R short arc quad knee extension, L long arc quad knee extneson with ankle pump at end range, L hip flexion, bil glut sets; 15 x 1 L heel raises.  Standing in parallel bars with min assist for L knee stability, 10 x 1 R hip extension, R hip abduction, L terminal knee extension; all with mirror feedback.  Sit> stand pulling up on both bars, min  assist 1st bout.  Pt stood 45 seconds, 30 seconds with mod assist, during L hand activity matching cards on table across bars. Mod assist for L knee stabiltiy.  Slide board transfer w/c>< mat with supervision, 1 cue for sequencing.  Sit> prone with supervision.    Prone R/L isolated hip extension, 2 x 10 each; L with assistance for L knee flexion. Supine 10 x 12 bil adductor squeezes, L unilateral bridging with RLE flexed at hip.  Pt left resting in w/c with needs at hand.     Therapy Documentation Precautions:  Precautions Precautions: Fall Restrictions Weight Bearing Restrictions: Yes RLE Weight Bearing: Non weight bearing General:   Vital Signs:  Pain:   Mobility:   Locomotion :    Trunk/Postural Assessment :    Balance:   Exercises:   Other Treatments:      Therapy/Group: Individual Therapy  Vinal Rosengrant 12/15/2018, 2:36 PM

## 2018-12-15 NOTE — Progress Notes (Signed)
Occupational Therapy Session Note  Patient Details  Name: [name] MRN: [id number] Date of Birth: 14-Dec-1950  Today's Date: 12/15/2018 OT Individual Time: 9122-5834 OT Individual Time Calculation (min): 71 min    Short Term Goals: Week 2:  OT Short Term Goal 1 (Week 2): STG=LTG 2/2 ELOS  Skilled Therapeutic Interventions/Progress Updates:    Treatment session with focus on ADL retraining and residual limb care.  Pt received seated EOB.  Pt donned pants with lateral leans to pull pants over hips, therapist assisting to pull up in back while pt pushed up from bed in to partial stand.  Pt completed slide board transfer to w/c after setting up slide board independently.  Therapist provided one cue about weight shifting, followed by transfer with supervision.  Pt completed UB bathing and grooming while seated at sink.  Educated on residual limb wrapping and provided with handout.  Educated on desensitization strategies with pt performing massage and tapping.  Reiterated use of inspection mirror to inspect healing and encouraged to notify MD if/when issues arise.  Pt remained upright in w/c with all needs in reach.  Therapy Documentation Precautions:  Precautions Precautions: Fall Restrictions Weight Bearing Restrictions: Yes RLE Weight Bearing: Non weight bearing Pain:  Pt with c/o pain in residual limb, not rated.   Therapy/Group: Individual Therapy  Simonne Come 12/15/2018, 11:31 AM

## 2018-12-15 NOTE — Progress Notes (Signed)
[place] PHYSICAL MEDICINE & REHABILITATION PROGRESS NOTE  Subjective/Complaints: Patient seen sitting up at the edge of his bed this morning.  He states he slept well overnight.  He notes erythema in the stump.  Patient notes stable CBGs.  He also states that he feels he needs to have a bowel movement today.  Patient noted to have Sprite all around him, educated on decreasing sodas.  He was seen by nephrology yesterday, notes reviewed.  ROS: Denies CP, shortness of breath, nausea, vomiting, diarrhea.  Objective: Vital Signs: Blood pressure (!) 145/67, pulse 68, temperature 98.6 F (37 C), temperature source Oral, resp. rate 18, height 6\' 4"  (1.93 m), weight 118.7 kg, SpO2 93 %. No results found. Recent Labs    12/13/18 1229  WBC 9.0  HGB 7.5*  HCT 23.3*  PLT 429*   Recent Labs    12/13/18 1230  NA 129*  K 4.2  CL 88*  CO2 24  GLUCOSE 148*  BUN 58*  CREATININE 8.38*  CALCIUM 9.2    Physical Exam: BP (!) 145/67 (BP Location: Right Arm)   Pulse 68   Temp 98.6 F (37 C) (Oral)   Resp 18   Ht 6\' 4"  (1.93 m)   Wt 118.7 kg   SpO2 93%   BMI 31.85 kg/m  Constitutional: No distress . Vital signs reviewed. Obese. HENT: Normocephalic.  Atraumatic.. Eyes: EOMI.  No discharge. Cardiovascular: No JVD. Respiratory: Normal effort. GI: Non-distended. Musc: Right BKA with edema and tenderness, improving Neurological: He is alert and oriented.  Patient is alert and follows full commands.  Motor:  Left lower extremity: Hip flexion, knee extension 5/5, ankle dorsiflexion 4/5, stable Right lower extremity: 4/5 hip flexion, knee extension (pain inhibition), improving Skin: Amputation site with staples C/D/I, erythema noted along staple lines Left plantar heel ulcer with eschar, decreasing in size Psychiatric: He has a normal mood and affect. His behavior is normal.    Assessment/Plan: 1. Functional deficits secondary to right BKA which require 3+ hours per day of  interdisciplinary therapy in a comprehensive inpatient rehab setting.  Physiatrist is providing close team supervision and 24 hour management of active medical problems listed below.  Physiatrist and rehab team continue to assess barriers to discharge/monitor patient progress toward functional and medical goals  Care Tool:  Bathing  Bathing activity did not occur: Refused(refused to attempt LB bathing, unable to lift buttocks off w/c with max assist) Body parts bathed by patient: Right arm, Left arm, Chest, Abdomen, Right upper leg, Left upper leg, Left lower leg, Face     Body parts n/a: Right lower leg   Bathing assist Assist Level: Minimal Assistance - Patient > 75%     Upper Body Dressing/Undressing Upper body dressing Upper body dressing/undressing activity did not occur (including orthotics): N/A What is the patient wearing?: Pull over shirt    Upper body assist Assist Level: Independent    Lower Body Dressing/Undressing Lower body dressing    Lower body dressing activity did not occur: Refused What is the patient wearing?: Underwear/pull up     Lower body assist Assist for lower body dressing: Moderate Assistance - Patient 50 - 74%     Toileting Toileting Toileting Activity did not occur Landscape architect and hygiene only): Safety/medical concerns  Toileting assist Assist for toileting: Moderate Assistance - Patient 50 - 74%     Transfers Chair/bed transfer  Transfers assist  Chair/bed transfer activity did not occur: N/A  Chair/bed transfer assist level: Supervision/Verbal cueing  Locomotion Ambulation   Ambulation assist   Ambulation activity did not occur: Safety/medical concerns(pt generalized weakness)          Walk 10 feet activity   Assist  Walk 10 feet activity did not occur: N/A        Walk 50 feet activity   Assist Walk 50 feet with 2 turns activity did not occur: N/A         Walk 150 feet activity   Assist Walk  150 feet activity did not occur: N/A         Walk 10 feet on uneven surface  activity   Assist Walk 10 feet on uneven surfaces activity did not occur: Safety/medical concerns         Wheelchair     Assist Will patient use wheelchair at discharge?: Yes Type of Wheelchair: Manual Wheelchair activity did not occur: N/A  Wheelchair assist level: Supervision/Verbal cueing Max wheelchair distance: 100'    Wheelchair 50 feet with 2 turns activity    Assist    Wheelchair 50 feet with 2 turns activity did not occur: N/A   Assist Level: Supervision/Verbal cueing   Wheelchair 150 feet activity     Assist Wheelchair 150 feet activity did not occur: N/A   Assist Level: Supervision/Verbal cueing      Medical Problem List and Plan: 1.   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 Antithrombotics: -DVT/anticoagulation:  Subcutaneous heparin due to              -antiplatelet therapy:  Plavix 75 mg daily 3. Pain Management:   Phantom limb pain improving  Lyrica 150 mg 3 times a day, Elavil 10 started on 4/23  Lidoderm patch ordered on 4/23  Oxycodone as needed             Encourage desensitization techniques, mirror therapy  Relatively controlled on 5/4 4. Mood:             -antipsychotic agents: Risperdal 0.25 mg daily 5. Neuropsych: This patient is capable of making decisions on his own behalf. 6. Skin/Wound Care: Routine skin care  Continue Santyl Santyl for left plantar heel ulcer-improving  Patient refusing PRAFO, states it makes him claustrophobic 7. Fluids/Electrolytes/Nutrition:  Routine I/Os.   8. Acute on chronic anemia.   Hb 7.5 on 5/2  Iron studies showing mildly decreased TIBC  Labs with HD  Cont to monitor 9. End-stage renal disease. Continue hemodialysis 10. Diabetes mellitus and peripheral neuropathy. HbA1c 11.2 on 4/16.   Lantus  insulin 85 units daily at bedtime, decreased to 70 on 4/27, decreased to 55 on 4/29, decreased to 15 on 5/1.   Will not plan to increase as hopefully diet compliance will improve CBGs  Diabetic teaching.     CBG (last 3)  Recent Labs    12/14/18 1633 12/14/18 2108 12/15/18 0618  GLUCAP 157* 165* 148*   Elevated on 5/4  Monitor with increased mobility 11. Hypertension.   Vitals:   12/14/18 2004 12/15/18 0453  BP: (!) 147/68 (!) 145/67  Pulse: 69 68  Resp: 14 18  Temp: 97.6 F (36.4 C) 98.6 F (37 C)  SpO2: 100% 93%   Elevated on 5/4  Recs per nephro  Monitor with increased mobility 12. COPD with remote tobacco abuse. Continue nebulizer as needed 13. Hyperlipidemia. Lipitor 14. Hypothyroidism. Synthroid 15. GERD. Protonix 16.  Drug-induced constipation. Laxative assistance  Bowel regimen increased on  4/28  Suppository ordered on 4/29  Proving overall 17.  Sleep disturbance  Melatonin 9 mg daily at bedtime  Trazodone d/ced  Elavil started on 4/23  Proving  LOS: 12 days A FACE TO FACE EVALUATION WAS PERFORMED  Ankit Lorie Phenix 12/15/2018, 9:07 AM

## 2018-12-15 NOTE — Progress Notes (Signed)
Physical Therapy Session Note  Patient Details  Name: [name] MRN: [id number] Date of Birth: [birth year]  Today's Date: 12/15/2018 PT Individual Time: 1430-1530 PT Individual Time Calculation (min): 60 min   Short Term Goals: Week 2:  PT Short Term Goal 1 (Week 2): Patient will perform bed<>chair transfers with supervision. PT Short Term Goal 2 (Week 2): Patient will be independent with HEP 2x per day. PT Short Term Goal 3 (Week 2): Patient will perform bed mobility independently with bed flat and without use of bed rail PT Short Term Goal 4 (Week 2): Patient will perform sit<>stand transfers with min A with LRAD for standing activities and exercise >2 min.   Skilled Therapeutic Interventions/Progress Updates:     Patient in w/c upon PT arrival. Patient alert and agreeable to PT session.  Therapeutic Activity: Transfers: Patient performed a unlevel slide board transfer to/from the bed with min A with bed elevated to 24". Provided verbal cues for hand placement and head-hips relationship. PT demonstrated a car transfer performing a squat pivot over the slide board for safety. Patient performed a car transfers with car elevated to 27.5" with the slide board with min A into the car utilizing squat pivot technique sitting 3x on the slide board before completing the transfer and CGA for returning from the car to the w/c. Patient performed a level transfer back to the bed with supervision. Patient was able to place and remove the slide board for all transfers with supervision for cuing.  Wheelchair Mobility:  Patient propelled wheelchair 50 feet, limited by fatigue, with 2 90 degree turns with supervision and managed all w/c parts and breaks with very minimal cuing throughout session.   Patient sitting EOB with a representative from [employer] present to assess patient for shrinker at end of session with breaks locked, bed alarm set, and all needs within reach. Educated patient about preventing  R knee contractures and positioning throughout the day during session.    Therapy Documentation Precautions:  Precautions Precautions: Fall Restrictions Weight Bearing Restrictions: Yes RLE Weight Bearing: Non weight bearing Pain: Reported 4/10 R residual limb pain described as constant, aching. PT provided repositioning and distraction as pain interventions throughout session.    Therapy/Group: Individual Therapy  Rylie Limburg L Joven Mom PT, DPT  12/15/2018, 4:48 PM

## 2018-12-16 ENCOUNTER — Inpatient Hospital Stay (HOSPITAL_COMMUNITY): Payer: Medicare Other | Admitting: Occupational Therapy

## 2018-12-16 ENCOUNTER — Inpatient Hospital Stay (HOSPITAL_COMMUNITY): Payer: Medicare Other

## 2018-12-16 DIAGNOSIS — G8918 Other acute postprocedural pain: Secondary | ICD-10-CM

## 2018-12-16 LAB — GLUCOSE, CAPILLARY
Glucose-Capillary: 133 mg/dL — ABNORMAL HIGH (ref 70–99)
Glucose-Capillary: 104 mg/dL — ABNORMAL HIGH (ref 70–99)
Glucose-Capillary: 149 mg/dL — ABNORMAL HIGH (ref 70–99)
Glucose-Capillary: 229 mg/dL — ABNORMAL HIGH (ref 70–99)

## 2018-12-16 MED ORDER — HEPARIN SODIUM (PORCINE) 1000 UNIT/ML IJ SOLN
INTRAMUSCULAR | Status: AC
  Filled 2018-12-16: qty 3

## 2018-12-16 MED ORDER — HEPARIN 1000 UNIT/ML FOR PERITONEAL DIALYSIS
2600.0000 [IU] | Freq: Once | INTRAMUSCULAR | Status: AC
  Administered 2018-12-16: 2600 [IU] via INTRAVENOUS
  Filled 2018-12-16: qty 2.6

## 2018-12-16 NOTE — Progress Notes (Signed)
Occupational Therapy Session Note  Patient Details  Name: [name] MRN: [id number] Date of Birth: 1950-09-09  Today's Date: 12/16/2018 OT Individual Time: 2301-7209 OT Individual Time Calculation (min): 87 min    Short Term Goals: Week 2:  OT Short Term Goal 1 (Week 2): STG=LTG 2/2 ELOS  Skilled Therapeutic Interventions/Progress Updates:    Pt received sitting EOB with c/o pain 3/10 in R residual limb, RN delivering pain medication to begin session. Pt completed slideboard transfer to w/c with CGA for transfer, min A to position board/chair. Pt propelled chair 100 ft with min A for time management to tub room. Pt used slideboard to transfer onto TTB with cueing for positioning of chair and discussion re accessibility at home. R residual limb occluded for shower. Pt able to lateral lean to undress and cleanse during shower. Pt completed all bathing seated with (S). Pt rqeuired mod A to place towel underneath his bottom to dry completely. During lateral lean, pt bumped residual limb on R lateral side, reporting pain initially but nothing following acute incident. Pt donned pants with min A to pull up posteriorly. Pt transferred back to w/c and returned to room. Shrinker removed and pt observed to have small area bleeding on distal residual limb. RN Nona Dell notified and informed this OT to place non adhering bandage and shrinker back on. Pt with no further c/o pain. Pt left sitting up with all needs met, RN updated on limb.   Therapy Documentation Precautions:  Precautions Precautions: Fall Restrictions Weight Bearing Restrictions: Yes RLE Weight Bearing: Non weight bearing   Therapy/Group: Individual Therapy  Curtis Sites 12/16/2018, 8:08 AM

## 2018-12-16 NOTE — Progress Notes (Signed)
[facility] Progress Note    Assessment/ Plan:   Dialysis Orders: 4.5h 119.5kg 2/2.5 bath 400/600 LUE AVF Hep 2000 then 1K/hr Primary nephrologist:Dr. Befekadu/ [facility] Epogen 1000 units 3x week Hectorol 0.48mcg IV 3x week Venofer 100 mg IV 3x week  1. 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 2. ESRD:Continue TTS schedule.Reports he cannot tolerate low BP, will keep systolic BP >947 With plans for possible dc ?this Friday. Will need hoyer pad upon d/c and preferably earlier to educate the spouse.  Plan on HD today. Outpt HD WILL NOT accept pt without the hoyer pad 3. Hypertension/volume:last hd  3l uf tolerated Post wt BED WT118.7 BPoverall at goal.Will continue to challenge EDW.Will likely need lower EDW at discharge.STRESSED VOL Compliance with diet 4. Anemia:Hgb7.5,Aranesp40 mcg with HD4/30 With iron panel=TFS 21% Venofer 100mg  x 3 /incr Aranesp 60 next time,No bleeding reported. 5. Metabolic bone disease:Yest CA , corrected10.6, VDRAon Hold .Will use lower Ca bath.phos was 5.2 Prior 7.0 on 2 binders in hosp.!! Not always getting binder with snacks, reminded to ask for it.Also on Sensipar 30 mg q day 6. Nutrition:Albuminvery low (2.1) - continue protein supplements. 7. Diabetes mellitus with neuropathy: Management per primary. On lyrica150 mg tid.Noted Elavil 10 mg also for phantom pain 8. COPD:Lungs CTA, denies wheezing or cough. Continue albuterol prn.  Subjective:   Worried about needing hoyer pad for outpt hd and also wife not knowing how to use it.   Objective:   BP (!) 149/73 (BP Location: Right Arm)   Pulse 74   Temp 98.4 F (36.9 C) (Oral)   Resp 16   Ht 6\' 4"  (1.93 m)   Wt 120.6 kg   SpO2 97%   BMI 32.36 kg/m   Intake/Output Summary (Last 24 hours) at 12/16/2018 1109 Last data filed at 12/16/2018 0962 Gross per 24 hour  Intake 340 ml  Output -  Net 340 ml    Weight change:   Physical Exam: General:Alert obese,Well developed male, NAD Heart:RRR,no rub or gallop Lungs:CTA bilat. and unlabored.  Abdomen:Soft,+ bs ,obese ,non-tender. Non-distended.  Extremities:Trace L LLEpedaledema,. S/p R BKADressing dry Dialysis Access:LUE AVF pulsatile but+ bruit  Imaging: No results found.  Labs: BMET Recent Labs  Lab 12/11/18 1417 12/13/18 1230  NA 131* 129*  K 4.5 4.2  CL 90* 88*  CO2 23 24  GLUCOSE 113* 148*  BUN 66* 58*  CREATININE 9.76* 8.38*  CALCIUM 9.2 9.2  PHOS 7.0* 5.2*   CBC Recent Labs  Lab 12/11/18 1425 12/13/18 1229  WBC 9.6 9.0  NEUTROABS 6.9  --   HGB 7.5* 7.5*  HCT 24.2* 23.3*  MCV 95.7 94.0  PLT 379 429*    Medications:    . amitriptyline  10 mg Oral QHS  . atorvastatin  80 mg Oral QHS  . calcium acetate  2,668 mg Oral TID WC  . Chlorhexidine Gluconate Cloth  6 each Topical Q0600  . cinacalcet  30 mg Oral QPM  . clopidogrel  75 mg Oral Daily  . collagenase   Topical BID  . [START ON 12/18/2018] darbepoetin (ARANESP) injection - DIALYSIS  60 mcg Intravenous Q Thu-HD  . feeding supplement (PRO-STAT SUGAR FREE 64)  30 mL Oral BID  . heparin  5,000 Units Subcutaneous Q8H  . insulin aspart  0-15 Units Subcutaneous TID WC  . insulin glargine  15 Units Subcutaneous QHS  . levothyroxine  125 mcg Oral Q0600  . lidocaine  1 patch  Transdermal Q24H  . Melatonin  9 mg Oral QHS  . pantoprazole  40 mg Oral Daily  . polyethylene glycol  17 g Oral BID  . pregabalin  150 mg Oral TID  . risperiDONE  0.25 mg Oral Daily  . senna-docusate  2 tablet Oral QHS      Otelia Santee, MD 12/16/2018, 11:09 AM

## 2018-12-16 NOTE — Progress Notes (Signed)
Occupational Therapy Session Note  Patient Details  Name: [name] MRN: [id number] Date of Birth: 01/27/1951  Today's Date: 12/16/2018 OT Individual Time: 1100-1130 OT Individual Time Calculation (min): 30 min    Short Term Goals: Week 2:  OT Short Term Goal 1 (Week 2): STG=LTG 2/2 ELOS  Skilled Therapeutic Interventions/Progress Updates:    Treatment session with focus on education regarding residual limb care and functional transfers.  Pt received seated EOB with no c/o pain.  Pt notified therapist of bleeding on distal residual limb noted this AM and asked to have it checked.  RN Nona Dell also present.  Removed shrinker and donned gauze wrapping and then replaced shrinker.  While shrinker removed, utilized limb inspection mirror with pt able to inspect all aspects of residual limb.  Required assist of 2 to apply shrinker appropriately.  Pt reports need to have BM.  Completed slide board transfer to drop arm BSC with independent setup and CGA during transfer.  Pt able to doff shorts with lateral leans, no BM.  Pt required min assist to pull pants back over hips and completed slide board with mod assist back to bed.  Pt left seated EOB with all needs in reach.  Therapy Documentation Precautions:  Precautions Precautions: Fall Restrictions Weight Bearing Restrictions: Yes RLE Weight Bearing: Non weight bearing Pain:  Pt with no c/o pain   Therapy/Group: Individual Therapy  Simonne Come 12/16/2018, 12:03 PM

## 2018-12-16 NOTE — Progress Notes (Signed)
[facility] PHYSICAL MEDICINE & REHABILITATION PROGRESS NOTE  Subjective/Complaints: Patient seen sitting up, working with therapy this morning.  He states he slept well overnight.  He received his stump shrinker.  He has questions regarding need for [facility] had for HD.  He states that he was told that the chair is too high and he would need to be lifted.  Discussed with therapies, patient still requiring min/mod a for transfers with walker.  ROS: Denies CP, shortness of breath, nausea, vomiting, diarrhea.  Objective: Vital Signs: Blood pressure (!) 149/73, pulse 74, temperature 98.4 F (36.9 C), temperature source Oral, resp. rate 16, height 6\' 4"  (1.93 m), weight 120.6 kg, SpO2 97 %. No results found. Recent Labs    12/13/18 1229  WBC 9.0  HGB 7.5*  HCT 23.3*  PLT 429*   Recent Labs    12/13/18 1230  NA 129*  K 4.2  CL 88*  CO2 24  GLUCOSE 148*  BUN 58*  CREATININE 8.38*  CALCIUM 9.2    Physical Exam: BP (!) 149/73 (BP Location: Right Arm)   Pulse 74   Temp 98.4 F (36.9 C) (Oral)   Resp 16   Ht 6\' 4"  (1.93 m)   Wt 120.6 kg   SpO2 97%   BMI 32.36 kg/m  Constitutional: No distress . Vital signs reviewed. Obese. HENT: Normocephalic.  Atraumatic. Eyes: EOMI.  No discharge. Cardiovascular: No JVD. Respiratory: Normal effort. GI: Non-distended. Musc: Right BKA with edema and tenderness, improving Neurological: He is alert and oriented.  Patient is alert and follows full commands.  Motor:  Left lower extremity: Hip flexion, knee extension 5/5, ankle dorsiflexion 4/5, unchanged Right lower extremity: 4-4+/5 hip flexion, knee extension, improving Skin: Amputation site with dressing C/D/I Left plantar heel ulcer with eschar, decreasing in size, not examined today Psychiatric: He has a normal mood and affect. His behavior is normal.    Assessment/Plan: 1. Functional deficits secondary to right BKA which require 3+ hours per day of interdisciplinary therapy in a  comprehensive inpatient rehab setting.  Physiatrist is providing close team supervision and 24 hour management of active medical problems listed below.  Physiatrist and rehab team continue to assess barriers to discharge/monitor patient progress toward functional and medical goals  Care Tool:  Bathing  Bathing activity did not occur: Refused(refused to attempt LB bathing, unable to lift buttocks off w/c with max assist) Body parts bathed by patient: Right arm, Left arm, Chest, Abdomen, Face     Body parts n/a: Right lower leg   Bathing assist Assist Level: Supervision/Verbal cueing     Upper Body Dressing/Undressing Upper body dressing Upper body dressing/undressing activity did not occur (including orthotics): N/A What is the patient wearing?: Pull over shirt    Upper body assist Assist Level: Independent    Lower Body Dressing/Undressing Lower body dressing    Lower body dressing activity did not occur: Refused What is the patient wearing?: Pants     Lower body assist Assist for lower body dressing: Minimal Assistance - Patient > 75%     Toileting Toileting Toileting Activity did not occur (Clothing management and hygiene only): Safety/medical concerns  Toileting assist Assist for toileting: Moderate Assistance - Patient 50 - 74%     Transfers Chair/bed transfer  Transfers assist  Chair/bed transfer activity did not occur: N/A  Chair/bed transfer assist level: Supervision/Verbal cueing     Locomotion Ambulation   Ambulation assist   Ambulation activity did not occur: Safety/medical concerns(pt generalized weakness)  Walk 10 feet activity   Assist  Walk 10 feet activity did not occur: N/A        Walk 50 feet activity   Assist Walk 50 feet with 2 turns activity did not occur: N/A         Walk 150 feet activity   Assist Walk 150 feet activity did not occur: N/A         Walk 10 feet on uneven surface  activity   Assist  Walk 10 feet on uneven surfaces activity did not occur: Safety/medical concerns         Wheelchair     Assist Will patient use wheelchair at discharge?: Yes Type of Wheelchair: Manual Wheelchair activity did not occur: N/A  Wheelchair assist level: Supervision/Verbal cueing Max wheelchair distance: 1'    Wheelchair 50 feet with 2 turns activity    Assist    Wheelchair 50 feet with 2 turns activity did not occur: N/A   Assist Level: Supervision/Verbal cueing   Wheelchair 150 feet activity     Assist Wheelchair 150 feet activity did not occur: N/A   Assist Level: Supervision/Verbal cueing      Medical Problem List and Plan: 1.   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 Antithrombotics: -DVT/anticoagulation:  Subcutaneous heparin due to              -antiplatelet therapy:  Plavix 75 mg daily 3. Pain Management:   Phantom limb pain improving  Lyrica 150 mg 3 times a day, Elavil 10 started on 4/23  Lidoderm patch ordered on 4/23  Oxycodone as needed             Encourage desensitization techniques, mirror therapy  Relatively controlled on 5/5 4. Mood:             -antipsychotic agents: Risperdal 0.25 mg daily 5. Neuropsych: This patient is capable of making decisions on his own behalf. 6. Skin/Wound Care: Routine skin care  Continue Santyl Santyl for left plantar heel ulcer-improving  Patient refusing PRAFO, states it makes him claustrophobic 7. Fluids/Electrolytes/Nutrition:  Routine I/Os.   8. Acute on chronic anemia.   Hb 7.5 on 5/2  Iron studies showing mildly decreased TIBC  Labs with HD  Cont to monitor 9. End-stage renal disease. Continue hemodialysis 10. Diabetes mellitus and peripheral neuropathy. HbA1c 11.2 on 4/16.   Lantus insulin 85 units daily at bedtime, decreased to 70 on 4/27, decreased to 55 on 4/29, decreased to 15 on 5/1.    Slightly elevated on 5/5  Diabetic teaching.     CBG (last 3)  Recent Labs    12/15/18 1623 12/15/18 2149 12/16/18 0633  GLUCAP 142* 166* 133*   Monitor with increased mobility 11. Hypertension.   Vitals:   12/15/18 2007 12/16/18 0524  BP: 139/70 (!) 149/73  Pulse: 69 74  Resp: 19 16  Temp: 97.8 F (36.6 C) 98.4 F (36.9 C)  SpO2: 100% 97%   Slightly elevated on 5/5  Recs per nephro  Monitor with increased mobility 12. COPD with remote tobacco abuse. Continue nebulizer as needed 13. Hyperlipidemia. Lipitor 14. Hypothyroidism. Synthroid 15. GERD. Protonix 16.  Drug-induced constipation. Laxative assistance  Bowel regimen increased on 4/28  Suppository ordered on 4/29  Improved 17.  Sleep disturbance  Melatonin 9 mg daily at bedtime  Trazodone d/ced  Elavil started on 4/23  Improved  LOS: 13 days A FACE TO FACE EVALUATION  WAS PERFORMED  [name] [name] 12/16/2018, 10:02 AM

## 2018-12-16 NOTE — Progress Notes (Signed)
Physical Therapy Session Note  Patient Details  Name: [name] MRN: [id number] Date of Birth: Nov 13, 1950  Today's Date: 12/16/2018 PT Individual Time: 0900-1015 PT Individual Time Calculation (min): 75 min   Short Term Goals: Week 2:  PT Short Term Goal 1 (Week 2): Patient will perform bed<>chair transfers with supervision. PT Short Term Goal 2 (Week 2): Patient will be independent with HEP 2x per day. PT Short Term Goal 3 (Week 2): Patient will perform bed mobility independently with bed flat and without use of bed rail PT Short Term Goal 4 (Week 2): Patient will perform sit<>stand transfers with min A with LRAD for standing activities and exercise >2 min.   Skilled Therapeutic Interventions/Progress Updates:     Patient in w/c upon PT arrival. Patient alert and agreeable to PT session.  Therapeutic Activity: Bed Mobility: Patient performed sit to supine with mod I.  Transfers: Patient performed slide board transfer into the car set to 27.5" for a midsize SUV performing squat pivot technique with slide board with min A for blocking L knee and trunk support holding onto a gait belt to prevent sliding back down. Sat 4x on SB during transfer and requires increased time for decreased assist. Performed slide board transfer to bed with mod I-supervision for safety, did not requiring cueing for set up or transfer, and asked for slide board and told therapist where to stand for guarding without cuing.   Wheelchair Mobility:  Patient propelled wheelchair total A throughout session for time management.   Therapeutic Exercise: Patient performed the following exercises with verbal and tactile cues for proper technique. LE exercises in the bed: -L bridging in supine -R quat sets hold 5 sec -B isometric hip adduction hold 5 sec -side-lying R hip abduction -R SLR in supine -B glut sets hold 5 sec -B hip extension in prone -B prone stretch 2 min  Sitting in w/c: educated on performing  every 20 min when sitting in w/c for reduced stiffness in residual limb and pressure relief when sitting for longer periods. -R LAQ x10  -w/c push ups with L LE assist x5   Patient in bed at end of session with breaks locked, bed alarm set, and all needs within reach.    Therapy Documentation Precautions:  Precautions Precautions: Fall Restrictions Weight Bearing Restrictions: Yes RLE Weight Bearing: Non weight bearing Pain: Pain Assessment Pain Scale: 0-10 Pain Score: 3.5 Pain Type: Surgical pain Pain Location: Knee, L elbow Pain Orientation: Right Pain Intervention(s): reposition; distraction; premedicated by RN    Therapy/Group: Individual Therapy  Akyla Vavrek L Christiaan Strebeck PT, DPT  12/16/2018, 1:06 PM

## 2018-12-17 ENCOUNTER — Encounter (HOSPITAL_COMMUNITY): Payer: Medicare Other

## 2018-12-17 ENCOUNTER — Inpatient Hospital Stay (HOSPITAL_COMMUNITY): Payer: Medicare Other

## 2018-12-17 ENCOUNTER — Inpatient Hospital Stay (HOSPITAL_COMMUNITY): Payer: Medicare Other | Admitting: Physical Therapy

## 2018-12-17 ENCOUNTER — Ambulatory Visit (HOSPITAL_COMMUNITY): Payer: Medicare Other

## 2018-12-17 LAB — GLUCOSE, CAPILLARY
Glucose-Capillary: 152 mg/dL — ABNORMAL HIGH (ref 70–99)
Glucose-Capillary: 267 mg/dL — ABNORMAL HIGH (ref 70–99)
Glucose-Capillary: 144 mg/dL — ABNORMAL HIGH (ref 70–99)
Glucose-Capillary: 157 mg/dL — ABNORMAL HIGH (ref 70–99)

## 2018-12-17 MED ORDER — CEPHALEXIN 250 MG PO CAPS
250.0000 mg | ORAL_CAPSULE | Freq: Every day | ORAL | Status: DC
  Administered 2018-12-17 – 2018-12-19 (×3): 250 mg via ORAL
  Filled 2018-12-17 (×3): qty 1

## 2018-12-17 NOTE — Progress Notes (Signed)
Physical Therapy Session Note  Patient Details  Name: [name] MRN: [id number] Date of Birth: [birth year]  Today's Date: 12/17/2018 PT Individual Time: 0950-1035 PT Individual Time Calculation (min): 45 min   Short Term Goals: Week 2:  PT Short Term Goal 1 (Week 2): Patient will perform bed<>chair transfers with supervision. PT Short Term Goal 2 (Week 2): Patient will be independent with HEP 2x per day. PT Short Term Goal 3 (Week 2): Patient will perform bed mobility independently with bed flat and without use of bed rail PT Short Term Goal 4 (Week 2): Patient will perform sit<>stand transfers with min A with LRAD for standing activities and exercise >2 min.   Skilled Therapeutic Interventions/Progress Updates: Pt presented at EOB agreeable to therapy. Pt stating pain 3/10 per pt not able to receive any additional pain meds. Pt performed SB transfer to w/c with set up of PTA providing SB. Pt then propelled to ADL apt and practiced SB transfer to recliner. Pt noted that at home will be transferring to L however in ADL apt transferred to R. Pt was able to transfer to recliner downhill with supervision. Performed uphill to L with CGA as PTA blocked w/c to prevent shifting. Pt indicated personal recliner is higher than current. Pt then propelled to ortho gym and participated in UBE L5 x 4 min for BUE endurance. Pt transported back to room at end of session and performed SB transfer to bed mod I. Pt left at EOB with call bell within reach, bed alarm on and needs met.      Therapy Documentation Precautions:  Precautions Precautions: Fall Restrictions Weight Bearing Restrictions: Yes RLE Weight Bearing: Non weight bearing    Therapy/Group: Individual Therapy  Yaritsa Savarino  Liliyana Thobe, PTA  12/17/2018, 1:27 PM

## 2018-12-17 NOTE — Progress Notes (Signed)
Physical Therapy Session Note  Patient Details  Name: [name] MRN: [id number] Date of Birth: March 21, 1951  Today's Date: 12/17/2018 PT Individual Time: 6387-5643 PT Individual Time Calculation (min): 62 min   Short Term Goals: Week 2:  PT Short Term Goal 1 (Week 2): Patient will perform bed<>chair transfers with supervision. PT Short Term Goal 2 (Week 2): Patient will be independent with HEP 2x per day. PT Short Term Goal 3 (Week 2): Patient will perform bed mobility independently with bed flat and without use of bed rail PT Short Term Goal 4 (Week 2): Patient will perform sit<>stand transfers with min A with LRAD for standing activities and exercise >2 min.   Skilled Therapeutic Interventions/Progress Updates:    Patient in w/c with daughter and wife in the room.  Discussed plans for session including car transfers and w/c set up.  Patient reports did not know exact measurement of seat height on his Kia Sorento, but daughter reports higher than 27.5 when car in ortho gym placed at that height and she measured against her height so placed at 29".  Patient able to propel w/c part way to gym 100' prior to arm fatigue and pushed rest of distance to save arm strength for transfer.  Patient transferred via SBT to car at 18" with min A increased time.  Patient touching residual R leg against edge of car, but did not know until cued to relieve pressure on end of leg.  Reports has decreased sensation end of his leg.  Patient back to w/c with min A/CGA for safety blocking L leg as he slid down into w/c.  Patient's wife assisted with transfer up to car, but struggled and wife unable to provide enough assist to perform safely without cues and support.  Patient's daughter voiced concerns about ability to perform transfer after dialysis.  Discussed cannot count on facility staff to assist consistently and daughter reports she could assist to pick up from dialysis in her sedan, but might not have room for w/c.   Discussed option of [facility] in [place] and having transportation for safety in case too fatigued after dialysis.  Daughter reports he has been a little resistant to it since sees other patients having to wait on transportation.  SW informed need for assist to get application expedited and available for when pt d/c home for Saturday.  Reviewed HEP with pt's wife verbally with handouts and discussed completing at least 2x daily on non-dialysis days.  Demonstrated w/c set up for folding and stowing for transport.  Patient performed SBT to bed with S including w/c set up.  Patient left sitting EOB with family in room and call bell in reach.    Therapy Documentation Precautions:  Precautions Precautions: Fall Restrictions Weight Bearing Restrictions: Yes RLE Weight Bearing: Non weight bearing Pain: Pain Assessment Pain Score: 3  Pain Type: Surgical pain Pain Location: Leg Pain Orientation: Right Pain Descriptors / Indicators: Sore Pain Onset: On-going Pain Intervention(s): Repositioned    Therapy/Group: Individual Therapy  Reginia Naas  Magda Kiel, PT 12/17/2018, 5:06 PM

## 2018-12-17 NOTE — Progress Notes (Addendum)
Subjective:  In room working with OT, Wife coming for education  And OT stated they contact Case manangement about needing Hoyer pad at [employer]   Objective Vital signs in last 24 hours: Vitals:   12/16/18 1810 12/16/18 1844 12/16/18 1922 12/17/18 0502  BP: (!) 162/58 (!) 154/71 138/68 (!) 125/59  Pulse: 70 78 80 71  Resp: 16 20 18 17   Temp: 97.6 F (36.4 C) 97.8 F (36.6 C) 97.6 F (36.4 C) 98.1 F (36.7 C)  TempSrc: Oral Oral Oral   SpO2: 98% 99% 100% 95%  Weight:      Height:       Weight change:   Physical Exam: General:Alert obese,Well developed male, NAD Heart:RRR,no rub or gallop Lungs:CTA bilat. and unlabored.  Abdomen:Soft,+ bs ,obese ,non-tender. Non-distended.  Extremities:Trace L LLEpedaledema,. S/p R BKADressing dry Dialysis Access:LUE AVF+ bruit  OP Dialysis Orders: 4.5h 119.5kg 2/2.5 bath 400/600 LUE AVF Hep 2000 then 1K/hr Primary nephrologist:Dr. Befekadu/ [facility] TTS Epogen 1000 units 3x week Hectorol 0.11mcg IV 3x week Venofer 100 mg IV 3x week   Problem/Plan: 1. 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 2. ESRD:Continue TTS schedule OP  DAVITA EDEN.has stated Reports he cannot tolerate low BP, I dw again  need to limit fluids . 5 sprite cans in room/will keep systolic BP >532 With plans for possible dc ?this Friday. Will need hoyer pad upon d/c and preferably earlier to educate the spouse.  Outpt HD WILL NOT accept pt without the hoyer pad 3. Hypertension/volume:last hdyest.3.5 l uf tolerated no post wt yest , prior hd  Post wt BED WT118.7 BPoverall at goal.Will continue to challenge EDW.Will likely need lower EDW at discharge. 4. Anemia:Hgb7.5,Aranesp40 mcg with HD4/30 With iron panel=TFS 21% Venofer 100mg  x 3 /incr Aranesp 60 next time,No bleeding reported. 5. Metabolic bone disease:CA , corrected10.6, VDRAon Hold .Will use lower Ca bath.phos was 5.2 Prior 7.0 on  2 binders in hosp.!! Not always getting binder with snacks, reminded to ask for it.Also on Sensipar 30 mg q day 6. Nutrition:Albuminvery low (2.1) - continue protein supplements. 7. Diabetes mellitus with neuropathy: Management per primary. On lyrica150 mg tid.Noted Elavil 10 mg also for phantom pain 8. COPD:Continue albuterol prn.   Ernest Haber, PA-C [facility] [phone] 12/17/2018,9:15 AM  LOS: 14 days   I have seen and examined this patient and agree with the plan of care. Plan on HD tomorrow Demetrius Charity). Appreciate team getting pt the hoyer pad so wife can learn how to utilize prior to D/C.   Dwana Melena, MD 12/17/2018, 1:41 PM  Labs: Basic Metabolic Panel: Recent Labs  Lab 12/11/18 1417 12/13/18 1230  NA 131* 129*  K 4.5 4.2  CL 90* 88*  CO2 23 24  GLUCOSE 113* 148*  BUN 66* 58*  CREATININE 9.76* 8.38*  CALCIUM 9.2 9.2  PHOS 7.0* 5.2*   Liver Function Tests: Recent Labs  Lab 12/11/18 1417 12/13/18 1230  ALBUMIN 2.2* 2.1*   No results for input(s): LIPASE, AMYLASE in the last 168 hours. No results for input(s): AMMONIA in the last 168 hours. CBC: Recent Labs  Lab 12/11/18 1425 12/13/18 1229  WBC 9.6 9.0  NEUTROABS 6.9  --   HGB 7.5* 7.5*  HCT 24.2* 23.3*  MCV 95.7 94.0  PLT 379 429*   Cardiac Enzymes: No results for input(s): CKTOTAL, CKMB, CKMBINDEX, TROPONINI in the last 168 hours. CBG: Recent Labs  Lab 12/16/18 [phone] 12/16/18 1146 12/16/18 1844 12/16/18  2127 12/17/18 0639  GLUCAP 133* 104* 149* 229* 152*    Studies/Results: No results found. Medications: . ferric gluconate (FERRLECIT/NULECIT) IV 125 mg (12/16/18 1636)   . amitriptyline  10 mg Oral QHS  . atorvastatin  80 mg Oral QHS  . calcium acetate  2,668 mg Oral TID WC  . Chlorhexidine Gluconate Cloth  6 each Topical Q0600  . cinacalcet  30 mg Oral QPM  . clopidogrel  75 mg Oral Daily  . collagenase   Topical BID  . [START ON 12/18/2018] darbepoetin  (ARANESP) injection - DIALYSIS  60 mcg Intravenous Q Thu-HD  . feeding supplement (PRO-STAT SUGAR FREE 64)  30 mL Oral BID  . heparin  5,000 Units Subcutaneous Q8H  . insulin aspart  0-15 Units Subcutaneous TID WC  . insulin glargine  15 Units Subcutaneous QHS  . levothyroxine  125 mcg Oral Q0600  . lidocaine  1 patch Transdermal Q24H  . Melatonin  9 mg Oral QHS  . pantoprazole  40 mg Oral Daily  . polyethylene glycol  17 g Oral BID  . pregabalin  150 mg Oral TID  . risperiDONE  0.25 mg Oral Daily  . senna-docusate  2 tablet Oral QHS

## 2018-12-17 NOTE — Progress Notes (Signed)
[facility] PHYSICAL MEDICINE & REHABILITATION PROGRESS NOTE  Subjective/Complaints: Patient seen sitting up in bed this morning.  He states he slept well overnight, except for feeling nauseous after eating after returning from HD.  He states this happens at home as well.  ROS: Denies CP, shortness of breath, nausea, vomiting, diarrhea.  Objective: Vital Signs: Blood pressure (!) 125/59, pulse 71, temperature 98.1 F (36.7 C), resp. rate 17, height 6\' 4"  (1.93 m), weight 120.6 kg, SpO2 95 %. No results found. No results for input(s): WBC, HGB, HCT, PLT in the last 72 hours. No results for input(s): NA, K, CL, CO2, GLUCOSE, BUN, CREATININE, CALCIUM in the last 72 hours.  Physical Exam: BP (!) 125/59 (BP Location: Right Arm)   Pulse 71   Temp 98.1 F (36.7 C)   Resp 17   Ht 6\' 4"  (1.93 m)   Wt 120.6 kg   SpO2 95%   BMI 32.36 kg/m  Constitutional: No distress . Vital signs reviewed. Obese. HENT: Normocephalic.  Atraumatic. Eyes: EOMI.  No discharge. Cardiovascular: No JVD. Respiratory: Normal effort. GI: Non-distended. Musc: Right BKA with edema and tenderness, improving Neurological: He is alert and oriented.  Patient is alert and follows full commands.  Motor:  Left lower extremity: Hip flexion, knee extension 5/5, ankle dorsiflexion 4/5, stable Right lower extremity: 4-4+/5 hip flexion, knee extension, stable Skin: Amputation site with staples C/D/I.  Erythema appears to be improving with ischemic changes along medial staple lines Left plantar heel ulcer with eschar, decreasing in size, not examined today Psychiatric: He has a normal mood and affect. His behavior is normal.    Assessment/Plan: 1. Functional deficits secondary to right BKA which require 3+ hours per day of interdisciplinary therapy in a comprehensive inpatient rehab setting.  Physiatrist is providing close team supervision and 24 hour management of active medical problems listed below.  Physiatrist and  rehab team continue to assess barriers to discharge/monitor patient progress toward functional and medical goals  Care Tool:  Bathing  Bathing activity did not occur: Refused(refused to attempt LB bathing, unable to lift buttocks off w/c with max assist) Body parts bathed by patient: Right arm, Left arm, Chest, Abdomen, Face, Front perineal area, Buttocks, Right upper leg, Left upper leg, Left lower leg     Body parts n/a: Right lower leg   Bathing assist Assist Level: Supervision/Verbal cueing     Upper Body Dressing/Undressing Upper body dressing Upper body dressing/undressing activity did not occur (including orthotics): N/A What is the patient wearing?: Pull over shirt    Upper body assist Assist Level: Independent    Lower Body Dressing/Undressing Lower body dressing    Lower body dressing activity did not occur: Refused What is the patient wearing?: Pants     Lower body assist Assist for lower body dressing: Minimal Assistance - Patient > 75%     Toileting Toileting Toileting Activity did not occur (Clothing management and hygiene only): Safety/medical concerns  Toileting assist Assist for toileting: Moderate Assistance - Patient 50 - 74%     Transfers Chair/bed transfer  Transfers assist  Chair/bed transfer activity did not occur: N/A  Chair/bed transfer assist level: Supervision/Verbal cueing     Locomotion Ambulation   Ambulation assist   Ambulation activity did not occur: Safety/medical concerns(pt generalized weakness)          Walk 10 feet activity   Assist  Walk 10 feet activity did not occur: N/A        Walk 50 feet  activity   Assist Walk 50 feet with 2 turns activity did not occur: N/A         Walk 150 feet activity   Assist Walk 150 feet activity did not occur: N/A         Walk 10 feet on uneven surface  activity   Assist Walk 10 feet on uneven surfaces activity did not occur: Safety/medical concerns          Wheelchair     Assist Will patient use wheelchair at discharge?: Yes Type of Wheelchair: Manual Wheelchair activity did not occur: N/A  Wheelchair assist level: Supervision/Verbal cueing Max wheelchair distance: 16'    Wheelchair 50 feet with 2 turns activity    Assist    Wheelchair 50 feet with 2 turns activity did not occur: N/A   Assist Level: Supervision/Verbal cueing   Wheelchair 150 feet activity     Assist Wheelchair 150 feet activity did not occur: N/A   Assist Level: Supervision/Verbal cueing      Medical Problem List and Plan: 1.   Decreased functional mobility secondary to right below-knee amputation 12/01/2018.[employer] consulted for stump shrinker  Continue CIR   Team conference today to discuss current and goals and coordination of care, home and environmental barriers, and discharge planning with nursing, case manager, and therapies.  2. Antithrombotics: -DVT/anticoagulation:  Subcutaneous heparin due to              -antiplatelet therapy:  Plavix 75 mg daily 3. Pain Management:   Phantom limb pain improving  Lyrica 150 mg 3 times a day, Elavil 10 started on 4/23  Lidoderm patch ordered on 4/23  Oxycodone as needed             Encourage desensitization techniques, mirror therapy  Relatively controlled on 5/6 4. Mood:             -antipsychotic agents: Risperdal 0.25 mg daily 5. Neuropsych: This patient is capable of making decisions on his own behalf. 6. Skin/Wound Care: Routine skin care  Continue Santyl Santyl for left plantar heel ulcer-improving  Patient refusing PRAFO, states it makes him claustrophobic  Will discuss with vascular and request evaluation 7. Fluids/Electrolytes/Nutrition:  Routine I/Os.   8. Acute on chronic anemia.   Hb 7.5 on 5/2  Iron studies showing mildly decreased TIBC  Labs with HD  Cont to monitor 9. End-stage renal disease. Continue hemodialysis 10. Diabetes mellitus and peripheral neuropathy.  HbA1c 11.2 on 4/16.   Lantus insulin 85 units daily at bedtime, decreased to 70 on 4/27, decreased to 55 on 4/29, decreased to 15 on 5/1.   Labile over the last 24 hours, continue to monitor  Diabetic teaching.     CBG (last 3)  Recent Labs    12/16/18 1844 12/16/18 2127 12/17/18 0639  GLUCAP 149* 229* 152*   Monitor with increased mobility 11. Hypertension.   Vitals:   12/16/18 1922 12/17/18 0502  BP: 138/68 (!) 125/59  Pulse: 80 71  Resp: 18 17  Temp: 97.6 F (36.4 C) 98.1 F (36.7 C)  SpO2: 100% 95%   Relatively controlled on 5/6  Recs per nephro  Monitor with increased mobility 12. COPD with remote tobacco abuse. Continue nebulizer as needed 13. Hyperlipidemia. Lipitor 14. Hypothyroidism. Synthroid 15. GERD. Protonix 16.  Drug-induced constipation. Laxative assistance  Bowel regimen increased on 4/28  Suppository ordered on 4/29  Improved 17.  Sleep disturbance  Melatonin 9 mg daily at bedtime  Trazodone d/ced  Elavil  started on 4/23  Improved  LOS: 14 days A FACE TO FACE EVALUATION WAS PERFORMED  Fidencio Duddy Lorie Phenix 12/17/2018, 9:44 AM

## 2018-12-17 NOTE — Progress Notes (Signed)
Social Work Patient ID: [name], male   DOB: November 09, 1950, 68 y.o.   MRN: [id number]  Met with pt, wife and daughter when here for education with pt in preparation of discharge Friday. Will order WC and transfer board and follow up therapies. Family feels they can provide the care pt will need at discharge. Have given him a hoyer pad for OP-HD to use, if doesn't fit the HD canter will need to get him one. Work toward discharge Friday.

## 2018-12-17 NOTE — Progress Notes (Signed)
Occupational Therapy Session Note  Patient Details  Name: [name] MRN: [id number] Date of Birth: 12/06/1950  Today's Date: 12/17/2018 OT Individual Time: [phone] OT Individual Time Calculation (min): 75 min    Short Term Goals: Week 2:  OT Short Term Goal 1 (Week 2): STG=LTG 2/2 ELOS  Skilled Therapeutic Interventions/Progress Updates:    OT intervention with focus on bed mobility, functional transfers, bathing/dressing EOB and w/c at sink, discharge planning, RLE therex, and safety awareness to increase independence with BADLs. Pt sitting EOB upon arrival and transferred to w/c with slide board (therapist steadied w/c 2/2 unreliable brakes on w/c). Pt completed UB bathing/dressing w/c at sink and returned to bed to complete LB bathing/dressing using lateral leans.  Pt engaged in therex independently following instruction sheets. Pt remained seated EOB with bed alarm activated and all needs within reach.   Therapy Documentation Precautions:  Precautions Precautions: Fall Restrictions Weight Bearing Restrictions: Yes RLE Weight Bearing: Non weight bearing  Pain: Pt c/o 3/10 pain RLE; emotional support, repositioned  Therapy/Group: Individual Therapy  [name] 12/17/2018, 10:14 AM

## 2018-12-17 NOTE — Progress Notes (Signed)
Occupational Therapy Session Note  Patient Details  Name: [name] MRN: [id number] Date of Birth: 01/14/1951  Today's Date: 12/17/2018 OT Individual Time: 1300-1355 OT Individual Time Calculation (min): 55 min    Short Term Goals: Week 1:  OT Short Term Goal 1 (Week 1): Pt will complete sit > stand with max assist in preparation for self-care tasks OT Short Term Goal 1 - Progress (Week 1): Met OT Short Term Goal 2 (Week 1): Pt will complete toilet transfer with mod assist  OT Short Term Goal 2 - Progress (Week 1): Met OT Short Term Goal 3 (Week 1): Pt will complete bathing with mod A sit<>stand(pt performs lateral leans for bathing while seated) OT Short Term Goal 3 - Progress (Week 1): Discontinued (comment)(pt performs lateral leans for LB bathing) OT Short Term Goal 4 (Week 1): Pt will complete LB dressing with mod assist OT Short Term Goal 4 - Progress (Week 1): Met  Skilled Therapeutic Interventions/Progress Updates:    OT intervention with focus on family education, functional transfers, discharge planning, and safety awareness.  Pt's wife and daughter present for education.  Recommended 24 hour supervision and close supervision for all sliding board transfers. Recommended that someone steady BSC/board during transfers to prevent board from slipping.  Pt performed slide board transfer with supervision.  Pt will not be able to acces bathroom initially.  Recommended bathing at bedside or seated in w/c at table.  Pt able to manage pants with lateral leans and occasional assistance to pull up in back.  Family and pt verbalized understanding.    Therapy Documentation Precautions:  Precautions Precautions: Fall Restrictions Weight Bearing Restrictions: Yes RLE Weight Bearing: Non weight bearing Pain: Pain Assessment Pain Scale: 0-10 Pain Score: 3  Faces Pain Scale: Hurts a little bit Pain Type: Surgical pain;Acute pain Pain Location: Leg(R BKA (stump)) Pain Orientation:  Other (Comment);Right Pain Descriptors / Indicators: Aching;Constant Pain Frequency: Constant Pain Onset: On-going Patients Stated Pain Goal: 0 Pain Intervention(s): Medicated prior to therapy; repositioned  Therapy/Group: Individual Therapy  ,  Chappell 12/17/2018, 2:42 PM 

## 2018-12-17 NOTE — Progress Notes (Signed)
  Progress Note    12/17/2018 12:44 PM 16 Days Post-Op  Subjective:  Says he has some pain along the bone when he straightens his leg.  Says he feels like the redness is getting better.   Afebrile  Vitals:   12/16/18 1922 12/17/18 0502  BP: 138/68 (!) 125/59  Pulse: 80 71  Resp: 18 17  Temp: 97.6 F (36.4 C) 98.1 F (36.7 C)  SpO2: 100% 95%    Physical Exam: Incisions:    Medial aspect right BKA 12/17/2018  Lateral aspect right BKA 12/17/2018    CBC    Component Value Date/Time   WBC 9.0 12/13/2018 1229   RBC 2.48 (L) 12/13/2018 1229   HGB 7.5 (L) 12/13/2018 1229   HGB 11.5 (L) 02/08/2017 1409   HCT 23.3 (L) 12/13/2018 1229   HCT 34.3 (L) 02/08/2017 1409   PLT 429 (H) 12/13/2018 1229   PLT 208 02/08/2017 1409   MCV 94.0 12/13/2018 1229   MCV 94 02/08/2017 1409   MCH 30.2 12/13/2018 1229   MCHC 32.2 12/13/2018 1229   RDW 16.0 (H) 12/13/2018 1229   RDW 16.7 (H) 02/08/2017 1409   LYMPHSABS 1.5 12/11/2018 1425   LYMPHSABS 2.1 02/08/2017 1409   MONOABS 0.7 12/11/2018 1425   EOSABS 0.3 12/11/2018 1425   EOSABS 0.2 02/08/2017 1409   BASOSABS 0.0 12/11/2018 1425   BASOSABS 0.0 02/08/2017 1409    BMET    Component Value Date/Time   NA 129 (L) 12/13/2018 1230   NA 134 02/08/2017 1403   K 4.2 12/13/2018 1230   CL 88 (L) 12/13/2018 1230   CO2 24 12/13/2018 1230   GLUCOSE 148 (H) 12/13/2018 1230   BUN 58 (H) 12/13/2018 1230   BUN 67 (H) 02/08/2017 1403   CREATININE 8.38 (H) 12/13/2018 1230   CALCIUM 9.2 12/13/2018 1230   GFRNONAA 6 (L) 12/13/2018 1230   GFRAA 7 (L) 12/13/2018 1230    INR    Component Value Date/Time   INR 1.1 02/08/2017 1403     Intake/Output Summary (Last 24 hours) at 12/17/2018 1244 Last data filed at 12/17/2018 0900 Gross per 24 hour  Intake 330 ml  Output 3500 ml  Net -3170 ml     Assessment/Plan:  68 y.o. male is s/p right below knee amputation 16 Days Post-Op  -pt with some erythema of the right BKA stump -will start pt on  Keflex x 7 days -keep appt with Dr. [name] on 01/01/2019   Leontine Locket, PA-C [facility] [phone] 12/17/2018 12:44 PM

## 2018-12-18 ENCOUNTER — Inpatient Hospital Stay (HOSPITAL_COMMUNITY): Payer: Medicare Other

## 2018-12-18 LAB — CBC
HCT: 21.3 % — ABNORMAL LOW (ref 39.0–52.0)
Hemoglobin: 6.9 g/dL — CL (ref 13.0–17.0)
MCH: 30.5 pg (ref 26.0–34.0)
MCHC: 32.4 g/dL (ref 30.0–36.0)
MCV: 94.2 fL (ref 80.0–100.0)
Platelets: 385 10*3/uL (ref 150–400)
RBC: 2.26 MIL/uL — ABNORMAL LOW (ref 4.22–5.81)
RDW: 16.6 % — ABNORMAL HIGH (ref 11.5–15.5)
WBC: 8.3 10*3/uL (ref 4.0–10.5)
nRBC: 0 % (ref 0.0–0.2)

## 2018-12-18 LAB — RENAL FUNCTION PANEL
Albumin: 2.3 g/dL — ABNORMAL LOW (ref 3.5–5.0)
Anion gap: 18 — ABNORMAL HIGH (ref 5–15)
BUN: 69 mg/dL — ABNORMAL HIGH (ref 8–23)
CO2: 24 mmol/L (ref 22–32)
Calcium: 9.2 mg/dL (ref 8.9–10.3)
Chloride: 90 mmol/L — ABNORMAL LOW (ref 98–111)
Creatinine, Ser: 8.67 mg/dL — ABNORMAL HIGH (ref 0.61–1.24)
GFR calc Af Amer: 7 mL/min — ABNORMAL LOW (ref >60)
GFR calc non Af Amer: 6 mL/min — ABNORMAL LOW (ref >60)
Glucose, Bld: 164 mg/dL — ABNORMAL HIGH (ref 70–99)
Phosphorus: 4.6 mg/dL (ref 2.5–4.6)
Potassium: 4.1 mmol/L (ref 3.5–5.1)
Sodium: 132 mmol/L — ABNORMAL LOW (ref 135–145)

## 2018-12-18 LAB — HEMOGLOBIN AND HEMATOCRIT, BLOOD
HCT: 26.9 % — ABNORMAL LOW (ref 39.0–52.0)
Hemoglobin: 8.7 g/dL — ABNORMAL LOW (ref 13.0–17.0)

## 2018-12-18 LAB — GLUCOSE, CAPILLARY
Glucose-Capillary: 154 mg/dL — ABNORMAL HIGH (ref 70–99)
Glucose-Capillary: 146 mg/dL — ABNORMAL HIGH (ref 70–99)
Glucose-Capillary: 113 mg/dL — ABNORMAL HIGH (ref 70–99)
Glucose-Capillary: 229 mg/dL — ABNORMAL HIGH (ref 70–99)

## 2018-12-18 LAB — PREPARE RBC (CROSSMATCH)

## 2018-12-18 MED ORDER — DARBEPOETIN ALFA 100 MCG/0.5ML IJ SOSY
PREFILLED_SYRINGE | INTRAMUSCULAR | Status: AC
  Filled 2018-12-18: qty 0.5

## 2018-12-18 MED ORDER — CLOPIDOGREL BISULFATE 75 MG PO TABS: 75.0000 mg | ORAL_TABLET | Freq: Every day | ORAL | 0 refills | Status: AC

## 2018-12-18 MED ORDER — RISPERIDONE 0.25 MG PO TABS: 0.2500 mg | ORAL_TABLET | Freq: Every day | ORAL | 0 refills | Status: DC

## 2018-12-18 MED ORDER — INSULIN GLARGINE 100 UNITS/ML SOLOSTAR PEN: 15.0000 [IU] | PEN_INJECTOR | Freq: Every day | SUBCUTANEOUS | 11 refills | Status: DC

## 2018-12-18 MED ORDER — ATORVASTATIN CALCIUM 80 MG PO TABS: 80.0000 mg | ORAL_TABLET | Freq: Every day | ORAL | 5 refills | Status: AC

## 2018-12-18 MED ORDER — COLLAGENASE 250 UNIT/GM EX OINT: TOPICAL_OINTMENT | Freq: Two times a day (BID) | CUTANEOUS | 0 refills | Status: DC

## 2018-12-18 MED ORDER — LEVOTHYROXINE SODIUM 125 MCG PO TABS: 125.0000 ug | ORAL_TABLET | Freq: Every day | ORAL | 0 refills | Status: DC

## 2018-12-18 MED ORDER — LIDOCAINE 5 % EX PTCH: 1.0000 | MEDICATED_PATCH | CUTANEOUS | 0 refills | Status: DC

## 2018-12-18 MED ORDER — SODIUM CHLORIDE 0.9% IV SOLUTION: Freq: Once | INTRAVENOUS | Status: DC

## 2018-12-18 MED ORDER — AMITRIPTYLINE HCL 10 MG PO TABS: 10.0000 mg | ORAL_TABLET | Freq: Every day | ORAL | 0 refills | Status: DC

## 2018-12-18 MED ORDER — PREGABALIN 150 MG PO CAPS: 150.0000 mg | ORAL_CAPSULE | Freq: Three times a day (TID) | ORAL | 0 refills | Status: DC

## 2018-12-18 MED ORDER — DARBEPOETIN ALFA 100 MCG/0.5ML IJ SOSY
100.0000 ug | PREFILLED_SYRINGE | Freq: Once | INTRAMUSCULAR | Status: AC
  Administered 2018-12-18: 100 ug via INTRAVENOUS

## 2018-12-18 MED ORDER — INSULIN GLARGINE 100 UNIT/ML SOLOSTAR PEN: 15.0000 [IU] | PEN_INJECTOR | Freq: Every day | SUBCUTANEOUS | 11 refills | Status: DC

## 2018-12-18 MED ORDER — MELATONIN 3 MG PO TABS: 9.0000 mg | ORAL_TABLET | Freq: Every day | ORAL | 0 refills | Status: DC

## 2018-12-18 MED ORDER — PANTOPRAZOLE SODIUM 40 MG PO TBEC: 40.0000 mg | DELAYED_RELEASE_TABLET | Freq: Every day | ORAL | 0 refills | Status: DC

## 2018-12-18 MED ORDER — OXYCODONE-ACETAMINOPHEN 5-325 MG PO TABS: 1.0000 | ORAL_TABLET | ORAL | 0 refills | Status: DC | PRN

## 2018-12-18 MED ORDER — ACETAMINOPHEN 325 MG PO TABS: 325.0000 mg | ORAL_TABLET | ORAL | Status: DC | PRN

## 2018-12-18 MED ORDER — OXYCODONE-ACETAMINOPHEN 5-325 MG PO TABS
1.0000 | ORAL_TABLET | Freq: Four times a day (QID) | ORAL | Status: DC | PRN
  Administered 2018-12-18 – 2018-12-19 (×3): 1 via ORAL
  Filled 2018-12-18 (×2): qty 1

## 2018-12-18 MED ORDER — CEPHALEXIN 250 MG PO CAPS: 250.0000 mg | ORAL_CAPSULE | Freq: Every day | ORAL | 0 refills | Status: DC

## 2018-12-18 NOTE — Progress Notes (Signed)
Occupational Therapy Discharge Summary  Patient Details  Name: [name] MRN: [id number] Date of Birth: May 19, 1951  Patient has met 8 of 9 long term goals due to improved activity tolerance, improved balance, postural control, ability to compensate for deficits and improved awareness.  Pt made steady progress with BADLs during this admission and performs bathing/dressing tasks at w/c and/or bed level with supervision.  Pt unable to access bathroom at w/c level. Pt performs slide board transfers to drop arm BSC and tub transfer bench with supervision. Pt completes toileting tasks with lateral leans seated on BSC.  Pt requires mod A/max A for sit<>stand and is unable to maintain standing balance adequately to complete any bathing/dressing tasks or stand pivot transfers. Pt's wife and daughter have been present for therapy and have verbalized understanding of recommendations for 24 hour supervision at discharge.Patient to discharge at overall Supervision level.  Patient's care partner is independent to provide the necessary physical and cognitive assistance at discharge.      Recommendation:  Patient will benefit from ongoing skilled OT services in home health setting to continue to advance functional skills in the area of BADL and Reduce care partner burden.  Equipment: No equipment provided Pt owns drop arm BSC and tub transfer bench.   Reasons for discharge: treatment goals met and discharge from hospital  Patient/family agrees with progress made and goals achieved: Yes  OT Discharge Vision Baseline Vision/History: Wears glasses Wears Glasses: Reading only Patient Visual Report: No change from baseline Perception  Perception: Within Functional Limits Praxis Praxis: Intact Cognition Overall Cognitive Status: Within Functional Limits for tasks assessed Arousal/Alertness: Awake/alert Orientation Level: Oriented X4 Attention: Selective Selective Attention: Appears intact Memory:  Appears intact Memory Impairment: Decreased recall of new information(needs repetition ) Awareness: Appears intact Problem Solving: Appears intact Safety/Judgment: Appears intact Sensation Sensation Light Touch: Impaired Detail Peripheral sensation comments: stocking distribution LLE to mid calf; also numbness bil fingertips, L more affected than R Light Touch Impaired Details: Absent LLE Proprioception: Impaired Detail Proprioception Impaired Details: Absent LLE Coordination Gross Motor Movements are Fluid and Coordinated: Yes Fine Motor Movements are Fluid and Coordinated: Yes Motor  Motor Motor: Within Functional Limits Trunk/Postural Assessment  Cervical Assessment Cervical Assessment: Within Functional Limits Thoracic Assessment Thoracic Assessment: Within Functional Limits Lumbar Assessment Lumbar Assessment: Within Functional Limits Postural Control Postural Control: Within Functional Limits  Balance Static Sitting Balance Static Sitting - Level of Assistance: 6: Modified independent (Device/Increase time) Dynamic Sitting Balance Dynamic Sitting - Level of Assistance: 6: Modified independent (Device/Increase time) Extremity/Trunk Assessment RUE Assessment RUE Assessment: Within Functional Limits LUE Assessment LUE Assessment: Within Functional Limits   [name] 12/18/2018, 10:27 AM

## 2018-12-18 NOTE — Progress Notes (Signed)
Occupational Therapy Session Note  Patient Details  Name: [name] MRN: [id number] Date of Birth: [birth year]  Today's Date: 12/18/2018 OT Individual Time: 8638-1771 OT Individual Time Calculation (min): 75 min    Short Term Goals: Week 2:  OT Short Term Goal 1 (Week 2): STG=LTG 2/2 ELOS  Skilled Therapeutic Interventions/Progress Updates:    OT intervention with focus on discharge planning, bathing/dressing w/c level and bed level, w/c mobility, safety awareness, and activity tolerance to prepare for discarge tomorrow.  Pt seated EOB upon arrival and transferred to w/c with slide board to completed UB bathing/dressing tasks at sink.  Pt returned to bed to complete LB bathing/dressing tasks with lateral leans. Pt completed all tasks at supervision level. Pt requires more than a reasonable amount of time to complete tasks and is very deliberate in all actions.  Pt engaged in w/c mobility in room and hallway without assistance.  Pt requires more than a reasonable amount of time for all w/c mobility activities. Pt practiced BSC transfers and toileting tasks on wide drop arm BSC similar to home setup.  Pt completes all tasks at close supervision.  Recommended another person be present with all slide board transfers to assure equipment doesn't move during transfer.  Pt verbalized understanding.  Pt remained in w/c with all needs within reach awaiting next threapy.   Therapy Documentation Precautions:  Precautions Precautions: Fall Restrictions Weight Bearing Restrictions: Yes RLE Weight Bearing: Non weight bearing   Pain: Pain Assessment Pain Scale: 0-10 Pain Score: 3  Pain Type: Acute pain;Surgical pain Pain Location: Leg Pain Orientation: Right Pain Descriptors / Indicators: Constant;Discomfort Pain Onset: On-going Patients Stated Pain Goal: 2 Pain Intervention(s): emotional support, med admin prior to therapy Multiple Pain Sites: No   Therapy/Group: Individual  Therapy  [name] 12/18/2018, 10:22 AM

## 2018-12-18 NOTE — Progress Notes (Signed)
Physical Therapy Discharge Summary  Patient Details  Name: [name] MRN: [id number] Date of Birth: August 25, 1950  Today's Date: 12/18/2018 PT Individual Time: 0915-1000 and 1030-1145 PT Individual Time Calculation (min): 45 min and 75  Patient has met 6 of 9 long term goals due to improved activity tolerance, improved balance, increased strength, increased range of motion, decreased pain, ability to compensate for deficits and functional use of  right upper extremity, left upper extremity and left lower extremity.  Patient to discharge at a wheelchair level Modified Independent.   Patient's care partner requires assistance to provide the necessary physical assistance for very unlevel transfer to car, with slide board, at discharge.  Family ed was limited.   Reasons goals not met: generalized weakness x 4 extremities, sensation loss bil fingers, LLE  Recommendation:  Patient will benefit from ongoing skilled PT services in home health setting to continue to advance safe functional mobility, address ongoing impairments in pt and family prosthetic ed, transfers, standing, pre-gait, balance, activity tolerance, and minimize fall risk.  Equipment: 20x18 w/c iwht basic cushion, amputee pad and bil ELRs, slide board  Reasons for discharge: treatment goals met and discharge from hospital  Patient/family agrees with progress made and goals achieved: Yes  PT Discharge tx 1:   Pt sitting up in w/c; his speech was slightly slurred; he stated that his mouth was dry and he had had medication.  W/c propulsion over level tile, 100' modified independent.  Slightly uphill slide board transfer w/c> mat using new personal slide board, with extra time, and self- corrected sequencing steps.  Bed mobility including moving into prone, independent.  Mat> new personal w/c with VCs for head hips relationship, slightly uphill using slide board.  PT educated pt on care of w/c cushion, w/c parts mgt.  Pt left sitting  up in w/c with needs at hand.  tx 2:  Pt sitting up in w/c.  W/c propulsion using personal w/c x 150' modified independent.  He stated that it was easier to push than CIR chair.  Simulated car transfer to 28.5" ht seat, with min assist to enter and exit, cues for head/hips relationship . (pt doubts that his SUV has 29" high seat as estimated by his dtr).  Once in w/c, pt able to turn to place slide board in tote bag on back of w/c.  strengthening wx, seated in w/c, using KInetron with LLE , resistance 20 cm/sec, x 20 cycles targeting quadricepts, x 20 cycles x 2 targeting gluteal muscles.  Discussed fitness for life, including overeating, type of foods and soft drinks; pt would like to lose weight, but has had a sedentary lifestyle.  Up ramp with min assist to get casters over threshold, and cues to lean forward; down ramp with close supervision.  At end of session, pt left resting in bed with needs at hand.  Precautions/Restrictions Precautions Precautions: Fall Restrictions Weight Bearing Restrictions: Yes RLE Weight Bearing: Non weight bearing   Pain Pain Assessment Pain Scale: 0-10 Pain Score: 3  Pain Type: Acute pain;Surgical pain Pain Location: Leg Pain Orientation: Right Pain Descriptors / Indicators: Constant;Discomfort Pain Onset: On-going Patients Stated Pain Goal: 2 Pain Intervention(s): Medication (See eMAR) Multiple Pain Sites: No Vision/Perception  Perception Perception: Within Functional Limits Praxis Praxis: Intact  Cognition Overall Cognitive Status: Within Functional Limits for tasks assessed  Pt has increased processing time, and needs many repetitions to learn new information.  He is resistant at times until rapport is established, and may be  more receptive to visual cues (e.g. for w/c parts mgt) rather than verbal cues.   Arousal/Alertness: Awake/alert Orientation Level: Oriented X4 Attention: Selective Selective Attention: Appears intact Memory:  Impaired Memory Impairment: Decreased recall of new information(needs repetition ) Problem Solving: Impaired(slow for mobility) Safety/Judgment: Appears intact Sensation Sensation Light Touch: Impaired Detail Peripheral sensation comments: stocking distribution LLE to mid calf; also numbness bil fingertips, L more affected than R Light Touch Impaired Details: Absent LLE Proprioception: Impaired Detail Proprioception Impaired Details: Absent LLE(L ankle 0/5 movements; absent kinesthesia also) Coordination Gross Motor Movements are Fluid and Coordinated: Yes Fine Motor Movements are Fluid and Coordinated: Yes Motor  Motor Motor - Discharge Observations: tremors resolved; generalized weakness  Mobility Bed Mobility Bed Mobility: Rolling Right;Rolling Left;Right Sidelying to Sit;Sit to Supine Rolling Right: Independent Rolling Left: Independent Right Sidelying to Sit: Independent Sit to Supine: Independent Transfers Transfers: Lateral/Scoot Transfers Lateral/Scoot Transfers: Moderate Assistance - Patient 50-74%;Supervision/Verbal cueing Transfer (Assistive device): Other (Comment)(slide board) Locomotion  Gait Ambulation: No Gait Gait: No Stairs / Additional Locomotion Stairs: No Wheelchair Mobility Wheelchair Mobility: Yes Wheelchair Assistance: Independent with assistive device Min assist up ramp with noticeable threshold; down with close supervision Wheelchair Propulsion: Both upper extremities Wheelchair Parts Management: Supervision/cueing Distance: 150  Trunk/Postural Assessment  Cervical Assessment Cervical Assessment: Within Functional Limits Thoracic Assessment Thoracic Assessment: Within Functional Limits Lumbar Assessment Lumbar Assessment: Within Functional Limits Postural Control Postural Control: Within Functional Limits  Balance Static Sitting Balance Static Sitting - Level of Assistance: 6: Modified independent (Device/Increase time) Dynamic Sitting  Balance Dynamic Sitting - Level of Assistance: 6: Modified independent (Device/Increase time) Static Standing Balance Static Standing - Level of Assistance: 3: Mod assist(in parallel bars with 1-2 UE support) Dynamic Standing Balance Dynamic Standing - Level of Assistance: Not tested (comment) Extremity Assessment  RUE Assessment RUE Assessment: Within Functional Limits LUE Assessment LUE Assessment: Within Functional Limits RLE Assessment Fingertips numb bilaterally, L greater than R  RLE Assessment: Exceptions to [facility] Passive Range of Motion (PROM) Comments: full extension in sitting Active Range of Motion (AROM) Comments: lacks 5 degrees in supine General Strength Comments: hip flexion 5/5, knee extension at least 3+/5 grossly in sitting EOB LLE Assessment LLE Assessment: Exceptions to WFL(pt with numerous abrasions due to fall to ground at home when attempting car tr) Passive Range of Motion (PROM) Comments: tight hamstrings and heel cords General Strength Comments: grossly in sitting: hip flexion 5/5, knee ext 5/5, ankle DF 2-/5    Nobuo Nunziata 12/18/2018, 11:18 AM

## 2018-12-18 NOTE — Progress Notes (Signed)
[facility] PHYSICAL MEDICINE & REHABILITATION PROGRESS NOTE  Subjective/Complaints: Patient seen sitting up in his bed this morning working with therapies.  He states he slept well overnight.  He notes overall improvement in constipation.  He states his left heel dressing change has not been performed in 1-2 days, discussed with nursing.  He has questions regarding discharge insulin.  He was seen by vascular yesterday and started on antibiotics.  ROS: Denies CP, shortness of breath, nausea, vomiting, diarrhea.  Objective: Vital Signs: Blood pressure 136/65, pulse 71, temperature 98.3 F (36.8 C), temperature source Oral, resp. rate 18, height 6\' 4"  (1.93 m), weight 120.6 kg, SpO2 97 %. No results found. No results for input(s): WBC, HGB, HCT, PLT in the last 72 hours. No results for input(s): NA, K, CL, CO2, GLUCOSE, BUN, CREATININE, CALCIUM in the last 72 hours.  Physical Exam: BP 136/65 (BP Location: Right Arm)   Pulse 71   Temp 98.3 F (36.8 C) (Oral)   Resp 18   Ht 6\' 4"  (1.93 m)   Wt 120.6 kg   SpO2 97%   BMI 32.36 kg/m  Constitutional: No distress . Vital signs reviewed. Obese. HENT: Normocephalic.  Atraumatic. Eyes: EOMI.  No discharge. Cardiovascular: No JVD. Respiratory: Normal effort. GI: Non-distended. Musc: Right BKA with edema and tenderness, stable Neurological: He is alert and oriented.  Patient is alert and follows full commands.  Motor:  Left lower extremity: Hip flexion, knee extension 5/5, ankle dorsiflexion 4/5, stable Right lower extremity: 4-4+/5 hip flexion, knee extension, stable Skin: Amputation site with dressing C/D/I.   Left plantar heel ulcer with eschar, decreasing in size, not examined today Psychiatric: He has a normal mood and affect. His behavior is normal.    Assessment/Plan: 1. Functional deficits secondary to right BKA which require 3+ hours per day of interdisciplinary therapy in a comprehensive inpatient rehab setting.  Physiatrist  is providing close team supervision and 24 hour management of active medical problems listed below.  Physiatrist and rehab team continue to assess barriers to discharge/monitor patient progress toward functional and medical goals  Care Tool:  Bathing  Bathing activity did not occur: Refused(refused to attempt LB bathing, unable to lift buttocks off w/c with max assist) Body parts bathed by patient: Right arm, Left arm, Chest, Abdomen, Face, Front perineal area, Buttocks, Right upper leg, Left upper leg, Left lower leg     Body parts n/a: Right lower leg   Bathing assist Assist Level: Supervision/Verbal cueing     Upper Body Dressing/Undressing Upper body dressing Upper body dressing/undressing activity did not occur (including orthotics): N/A What is the patient wearing?: Pull over shirt    Upper body assist Assist Level: Independent    Lower Body Dressing/Undressing Lower body dressing    Lower body dressing activity did not occur: Refused What is the patient wearing?: Pants     Lower body assist Assist for lower body dressing: Supervision/Verbal cueing     Toileting Toileting Toileting Activity did not occur (Clothing management and hygiene only): Safety/medical concerns  Toileting assist Assist for toileting: Supervision/Verbal cueing     Transfers Chair/bed transfer  Transfers assist  Chair/bed transfer activity did not occur: N/A  Chair/bed transfer assist level: Supervision/Verbal cueing     Locomotion Ambulation   Ambulation assist   Ambulation activity did not occur: Safety/medical concerns(pt generalized weakness)          Walk 10 feet activity   Assist  Walk 10 feet activity did not occur: N/A  Walk 50 feet activity   Assist Walk 50 feet with 2 turns activity did not occur: N/A         Walk 150 feet activity   Assist Walk 150 feet activity did not occur: N/A         Walk 10 feet on uneven surface   activity   Assist Walk 10 feet on uneven surfaces activity did not occur: Safety/medical concerns         Wheelchair     Assist Will patient use wheelchair at discharge?: Yes Type of Wheelchair: Manual Wheelchair activity did not occur: N/A  Wheelchair assist level: Supervision/Verbal cueing Max wheelchair distance: 150, slowly    Wheelchair 50 feet with 2 turns activity    Assist    Wheelchair 50 feet with 2 turns activity did not occur: N/A   Assist Level: Supervision/Verbal cueing   Wheelchair 150 feet activity     Assist Wheelchair 150 feet activity did not occur: N/A   Assist Level: Supervision/Verbal cueing      Medical Problem List and Plan: 1.   Decreased functional mobility secondary to right below-knee amputation 12/01/2018.[employer] consulted for stump shrinker  Continue CIR   Plan for d/c tomorrow  Will see patient for transitional care management in 1-2 weeks post-discharge 2. Antithrombotics: -DVT/anticoagulation:  Subcutaneous heparin due to              -antiplatelet therapy:  Plavix 75 mg daily 3. Pain Management:   Phantom limb pain improving  Lyrica 150 mg 3 times a day, Elavil 10 started on 4/23  Lidoderm patch ordered on 4/23  Oxycodone as needed, decreased on 5/7             Encourage desensitization techniques, mirror therapy  Relatively controlled on 5/6 4. Mood:             -antipsychotic agents: Risperdal 0.25 mg daily 5. Neuropsych: This patient is capable of making decisions on his own behalf. 6. Skin/Wound Care: Routine skin care  Continue Santyl Santyl for left plantar heel ulcer-improving-discussed with nursing  Patient refusing PRAFO, states it makes him claustrophobic  Vascular started patient on Keflex x7 days. 7. Fluids/Electrolytes/Nutrition:  Routine I/Os.   8. Acute on chronic anemia.   Hb 7.5 on 5/2  Iron studies showing mildly decreased TIBC  Labs with HD  Cont to monitor 9. End-stage renal  disease. Continue hemodialysis 10. Diabetes mellitus and peripheral neuropathy. HbA1c 11.2 on 4/16.   Lantus insulin 85 units daily at bedtime, decreased to 70 on 4/27, decreased to 55 on 4/29, decreased to 15 on 5/1.   Slightly labile, but overall improved control on 5/7  Diabetic teaching.     CBG (last 3)  Recent Labs    12/17/18 1630 12/17/18 2137 12/18/18 0639  GLUCAP 144* 157* 154*   Monitor with increased mobility 11. Hypertension.   Vitals:   12/17/18 2000 12/18/18 0544  BP: 130/68 136/65  Pulse: 74 71  Resp: 18 18  Temp: 98 F (36.7 C) 98.3 F (36.8 C)  SpO2: 99% 97%   Controlled on 5/7  Recs per nephro  Monitor with increased mobility 12. COPD with remote tobacco abuse. Continue nebulizer as needed 13. Hyperlipidemia. Lipitor 14. Hypothyroidism. Synthroid 15. GERD. Protonix 16.  Drug-induced constipation. Laxative assistance  Bowel regimen increased on 4/28  Suppository ordered on 4/29  Improved 17.  Sleep disturbance  Melatonin 9 mg daily at bedtime  Trazodone d/ced  Elavil started on 4/23  Improved  LOS: 15 days A FACE TO FACE EVALUATION WAS PERFORMED  Ivelise Castillo Lorie Phenix 12/18/2018, 11:54 AM

## 2018-12-18 NOTE — Progress Notes (Signed)
[facility] Progress Note    Assessment/ Plan:   OP Dialysis Orders: 4.5h 119.5kg 2/2.5 bath 400/600 LUE AVF Hep 2000 then 1K/hr Primary nephrologist:Dr. Befekadu/ [facility] TTS Epogen 1000 units 3x week Hectorol 0.41mcg IV 3x week Venofer 100 mg IV 3x week   Problem/Plan: 1. 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 2. ESRD:Continue TTS schedule OP  DAVITA EDEN.has stated Reports he cannot tolerate low BP, I dw again  need to limit fluids . 5 sprite cans in room/will keep systolic BP >619 With plans for possible dc ?thisFriday.Now has medium sized hoyer pad.  - going for HD today. 3. Hypertension/volume:last hdyest.3.5 l uf tolerated no post wt yest , prior hd  Post wt BED WT118.7 BPoverall at goal.Will continue to challenge EDW.Will likely need lower EDW at discharge. 4. Anemia:Hgb7.5,Aranesp40 mcg with HD4/30 With iron panel=TFS 21% Venofer 100mg  x 3 /incr Aranesp 60 last given 4/30. - Will given Aranesp  100  today as well 5. Metabolic bone disease:CA , corrected10.6, VDRAon Hold .Will use lower Ca bath.phos was 5.2 Prior 7.0 on 2 binders in hosp.!! Not always getting binder with snacks, reminded to ask for it.Also on Sensipar 30 mg q day 6. Nutrition:Albuminvery low (2.1) - continue protein supplements. 7. Diabetes mellitus with neuropathy: Management per primary. On lyrica150 mg tid.Noted Elavil 10 mg also for phantom pain 8. COPD:Continue albuterol prn.  Subjective:    He denies f/cn/v/dyspnea. Nervous about changing environments.   Objective:   BP 136/65 (BP Location: Right Arm)   Pulse 71   Temp 98.3 F (36.8 C) (Oral)   Resp 18   Ht 6\' 4"  (1.93 m)   Wt 120.6 kg   SpO2 97%   BMI 32.36 kg/m   Intake/Output Summary (Last 24 hours) at 12/18/2018 0820 Last data filed at 12/17/2018 1840 Gross per 24 hour  Intake 360 ml  Output -  Net 360 ml   Weight change:    Physical Exam: General:Alert obese,Well developed male, NAD Heart:RRR,no rub or gallop Lungs:CTA bilat. and unlabored.  Abdomen:Soft,+ bs ,obese ,non-tender. Non-distended.  Extremities:Trace L LLEpedaledema,. S/p R BKADressing dry Dialysis Access:LUE AVF+ bruit  Imaging: No results found.  Labs: BMET Recent Labs  Lab 12/11/18 1417 12/13/18 1230  NA 131* 129*  K 4.5 4.2  CL 90* 88*  CO2 23 24  GLUCOSE 113* 148*  BUN 66* 58*  CREATININE 9.76* 8.38*  CALCIUM 9.2 9.2  PHOS 7.0* 5.2*   CBC Recent Labs  Lab 12/11/18 1425 12/13/18 1229  WBC 9.6 9.0  NEUTROABS 6.9  --   HGB 7.5* 7.5*  HCT 24.2* 23.3*  MCV 95.7 94.0  PLT 379 429*    Medications:    . amitriptyline  10 mg Oral QHS  . atorvastatin  80 mg Oral QHS  . calcium acetate  2,668 mg Oral TID WC  . cephALEXin  250 mg Oral Daily  . Chlorhexidine Gluconate Cloth  6 each Topical Q0600  . cinacalcet  30 mg Oral QPM  . clopidogrel  75 mg Oral Daily  . collagenase   Topical BID  . darbepoetin (ARANESP) injection - DIALYSIS  60 mcg Intravenous Q Thu-HD  . feeding supplement (PRO-STAT SUGAR FREE 64)  30 mL Oral BID  . heparin  5,000 Units Subcutaneous Q8H  . insulin aspart  0-15 Units Subcutaneous TID WC  . insulin glargine  15 Units Subcutaneous QHS  . levothyroxine  125 mcg Oral Q0600  . lidocaine  1 patch Transdermal Q24H  . Melatonin  9 mg Oral QHS  . pantoprazole  40 mg Oral Daily  . polyethylene glycol  17 g Oral BID  . pregabalin  150 mg Oral TID  . risperiDONE  0.25 mg Oral Daily  . senna-docusate  2 tablet Oral QHS      Otelia Santee, MD 12/18/2018, 8:20 AM

## 2018-12-18 NOTE — Patient Care Conference (Signed)
Inpatient RehabilitationTeam Conference and Plan of Care Update Date: 12/17/2018   Time: 2:00 PM    Patient Name: [name]      Medical Record Number: [id number]  Date of Birth: 1950-10-12 Sex: Male         Room/Bed: 4M11C/4M11C-01 Payor Info: Payor: MEDICARE / Plan: MEDICARE PART A AND B / Product Type: *No Product type* /    Admitting Diagnosis: BKA  Dialysis  Admit Date/Time:  12/03/2018  5:52 PM Admission Comments: No comment available   Primary Diagnosis:  <principal problem not specified> Principal Problem: <principal problem not specified>  Patient Active Problem List   Diagnosis Date Noted  . Post-operative pain   . Labile blood pressure   . Labile blood glucose   . Hx of anxiety disorder   . Hypoglycemia   . Diabetic peripheral neuropathy ([place])   . Neuropathic pain   . S/P unilateral BKA (below knee amputation), right ([place])   . Sleep disturbance   . Type 2 diabetes mellitus with peripheral neuropathy ([facility])   . Benign essential HTN   . Acute blood loss anemia   . Anemia of chronic disease   . Right below-knee amputee ([place]) 12/03/2018  . Necrotizing fasciitis of ankle and foot ([place])   . Unilateral complete BKA, right, initial encounter ([place])   . Postoperative pain   . Phantom limb pain ([place])   . Drug induced constipation   . Poorly controlled type 2 diabetes mellitus with peripheral neuropathy ([place])   . Acute on chronic anemia   . Ischemia of extremity 12/01/2018  . PAD (peripheral artery disease) ([place]) 02/12/2017  . Critical lower limb ischemia 02/11/2017  . Acute respiratory failure with hypoxia ([place]) 08/28/2015  . Hypertensive urgency 08/28/2015  . Chronic anemia 08/28/2015  . ESRD on dialysis ([place]) 08/28/2015  . Diabetes mellitus with end stage renal disease ([place]) 08/28/2015  . PVD (peripheral vascular disease) ([place]) 07/15/2014  . Peripheral vascular disease, unspecified ([place]) 01/14/2014  . Pain in limb-left arm 04/09/2013  . Guaiac + stool 04/07/2013   . Anemia 04/07/2013  . Dysphagia, unspecified(787.20) 04/07/2013  . Numbness and tingling-left arm  02/04/2013  . Cold hands and feet-left arm 02/04/2013  . Other complications due to renal dialysis device, implant, and graft 06/06/2012  . End stage renal disease ([place]) 02/05/2012  . Injury to blood vessels, unspecified site 02/05/2012  . Pain in joint, shoulder region 09/14/2011    Expected Discharge Date: Expected Discharge Date: 12/19/18  Team Members Present: Physician leading conference: Dr. Delice Lesch Social Worker Present: Ovidio Kin, LCSW Nurse Present: Other (comment)(Akivia Fransico [place]) PT Present: Other (comment)(Cindy Wynn-PT) OT Present: Roanna Epley, [place], OT SLP Present: Weston Anna, SLP PPS Coordinator present : Gunnar Fusi     Current Status/Progress Goal Weekly Team Focus  Medical   Decreased functional mobility secondary to right below-knee amputation 12/01/2018.  Improve mobility, transfers, pain, CBGs, stump erythema, constipation  See above   Bowel/Bladder   Pt is continent B/B. LBM 12/13/2018.  Encourage bowel movement via orders.  Assist with toileting needs PRN.   Swallow/Nutrition/ Hydration             ADL's   UB bathing/dressing-supervisoin; LB bathing-CGA; LB dressing-CGA; functional tranfsers slide board-supervisoin; toileting-min/mod A for clothing management  supervision overall      Mobility   supervison bed mobility, level slide board transfers, min A unlevel slide board transfers and car transfers, min-mod A sit<>stand in // bars, supervison w/c mobility 150'  supervision for bed mobility and transfers, no ambulation goals at this time, mod I for w/c mobility 150' and ramp  strengthening/ROM, functional mobility, balance, edema, control, skin integrity, w/c mobility, activity tolerance, pt/family education   Communication             Safety/Cognition/ Behavioral Observations            Pain   Pt complains of pain of 7  to R BKA site. Pain managed with Oxycodone.  Pain < 3  Assess pain Q shift and PRN    Skin   Pt has ulcer to posterior L foot, and incision to R BKA site.  Treat skin issues per orders to promote healing.  Assess skin Q shift and PRN.      *See Care Plan and progress notes for long and short-term goals.     Barriers to Discharge  Current Status/Progress Possible Resolutions Date Resolved   Physician    Medical stability;Wound Care;Weight bearing restrictions;Weight     See above  Therapies, optimize insulin, optimize pain meds, optimize bowel reg, Vasc eval of stump      Nursing                  PT  Hemodialysis;Wound Care  Concerned about car transfers following dialysis, may need much more assist due to fatigue              OT                  SLP                SW                Discharge Planning/Teaching Needs:  Wife and daughter coming in today for education in prepartion for DC Friday      Team Discussion:  Progressing toward his goals of supervision level wheelchair no gait goals. Family education today with daughter and wife. Pian better. Vascular coming to look at stump. Stump shrinker place on. Ready to DC friday. Medically stable for DC tomorrow.  Revisions to Treatment Plan:  DC 5/8    Continued Need for Acute Rehabilitation Level of Care: The patient requires daily medical management by a physician with specialized training in physical medicine and rehabilitation for the following conditions: Daily direction of a multidisciplinary physical rehabilitation program to ensure safe treatment while eliciting the highest outcome that is of practical value to the patient.: Yes Daily medical management of patient stability for increased activity during participation in an intensive rehabilitation regime.: Yes Daily analysis of laboratory values and/or radiology reports with any subsequent need for medication adjustment of medical intervention for : Post surgical  problems;Diabetes problems;Other;Wound care problems   I attest that I was present, lead the team conference, and concur with the assessment and plan of the team. Teleconference held due to [place], [place] 12/18/2018, 8:27 AM

## 2018-12-18 NOTE — Discharge Summary (Addendum)
Physician Discharge Summary  Patient ID: DEMARR KLUEVER MRN: [id number] DOB/AGE: July 21, 1951 68 y.o.  Admit date: 12/03/2018 Discharge date: 12/19/2018  Discharge Diagnoses:  Active Problems:   Right below-knee amputee (HCC)   Neuropathic pain   S/P unilateral BKA (below knee amputation), right (HCC)   Sleep disturbance   Type 2 diabetes mellitus with peripheral neuropathy (HCC)   Benign essential HTN   Acute blood loss anemia   Anemia of chronic disease   Diabetic peripheral neuropathy (HCC)   Hx of anxiety disorder   Hypoglycemia   Labile blood pressure   Labile blood glucose   Post-operative pain   Discharged Condition: Stable  Significant Diagnostic Studies: Dg Chest Port 1 View  Result Date: 12/01/2018 CLINICAL DATA:  Wheezing EXAM: PORTABLE CHEST 1 VIEW COMPARISON:  February 11, 2017. FINDINGS: There is no edema or consolidation. Heart is mildly enlarged with pulmonary vascularity normal. No adenopathy. There is aortic atherosclerosis. No evident bone lesions. IMPRESSION: Mild cardiac enlargement. No edema or consolidation. Aortic Atherosclerosis (ICD10-I70.0). Electronically Signed   By: Lowella Grip III M.D.   On: 12/01/2018 14:13   Dg Foot Complete Right  Result Date: 12/01/2018 CLINICAL DATA:  68 year old male with a history of foot wound EXAM: RIGHT FOOT COMPLETE - 3+ VIEW COMPARISON:  None. FINDINGS: Soft tissue swelling with subcutaneous gas of the forefoot, predominantly within the plantar soft tissues. No displaced fracture. Degenerative changes of the forefoot and midfoot. Degenerative changes of the hindfoot with enthesopathic changes at the Achilles insertion and plantar fascia insertion. Dense calcifications of the tibial plantar arteries. IMPRESSION: Soft tissue swelling with subcutaneous gas, concerning for necrotizing infection. Atherosclerosis. Degenerative changes of the foot. Electronically Signed   By: Corrie Mckusick D.O.   On: 12/01/2018 14:28   Vas US  Carotid  Result Date: 12/02/2018 Carotid Arterial Duplex Study Indications:  Bruit. Risk Factors: Hypertension, Diabetes. Performing Technologist: Maudry Mayhew MHA, RDMS, RVT, RDCS  Examination Guidelines: A complete evaluation includes B-mode imaging, spectral Doppler, color Doppler, and power Doppler as needed of all accessible portions of each vessel. Bilateral testing is considered an integral part of a complete examination. Limited examinations for reoccurring indications may be performed as noted.  Right Carotid Findings: +----------+--------+-------+--------+--------------------------------+--------+           PSV cm/sEDV    StenosisDescribe                        Comments                   cm/s                                                    +----------+--------+-------+--------+--------------------------------+--------+ CCA Prox  131     21                                                      +----------+--------+-------+--------+--------------------------------+--------+ CCA Distal87      19             smooth and heterogenous                  +----------+--------+-------+--------+--------------------------------+--------+ ICA Prox  136     30             smooth, heterogenous and                                                  calcific                                 +----------+--------+-------+--------+--------------------------------+--------+ ICA Distal110     21                                                      +----------+--------+-------+--------+--------------------------------+--------+ ECA       143                                                             +----------+--------+-------+--------+--------------------------------+--------+ +----------+--------+-------+----------------+-------------------+           PSV cm/sEDV cmsDescribe        Arm Pressure (mmHG)  +----------+--------+-------+----------------+-------------------+ [id number]            Multiphasic, WNL                    +----------+--------+-------+----------------+-------------------+ +---------+--------+--+--------+--+---------+ VertebralPSV cm/s86EDV cm/s27Antegrade +---------+--------+--+--------+--+---------+  Left Carotid Findings: +----------+--------+-------+--------+--------------------------------+--------+           PSV cm/sEDV    StenosisDescribe                        Comments                   cm/s                                                    +----------+--------+-------+--------+--------------------------------+--------+ CCA Prox  65      15                                                      +----------+--------+-------+--------+--------------------------------+--------+ CCA Distal86      25             smooth, heterogenous and                                                  calcific                                 +----------+--------+-------+--------+--------------------------------+--------+ ICA Prox  308     47  calcific and irregular                   +----------+--------+-------+--------+--------------------------------+--------+ ICA Distal116     33                                                      +----------+--------+-------+--------+--------------------------------+--------+ ECA       201     8              smooth and heterogenous                  +----------+--------+-------+--------+--------------------------------+--------+ +----------+--------+--------+----------+-------------------+ SubclavianPSV cm/sEDV cm/sDescribe  Arm Pressure (mmHG) +----------+--------+--------+----------+-------------------+           196             Monophasic                    +----------+--------+--------+----------+-------------------+ +---------+--------+--+--------+--+---------+ VertebralPSV  cm/s55EDV cm/s16Antegrade +---------+--------+--+--------+--+---------+  Summary: Right Carotid: Velocities in the right ICA are consistent with a 1-39% stenosis. Left Carotid: Velocities in the left ICA are consistent with a 40-59% stenosis. Vertebrals:  Bilateral vertebral arteries demonstrate antegrade flow. Subclavians: Left subclavian artery was monophasic, suggestive of possible              proximal obstruction. Normal flow hemodynamics were seen in the              right subclavian artery. *See table(s) above for measurements and observations.  Electronically signed by Deitra Mayo MD on 12/02/2018 at 10:55:29 AM.    Final     Labs:  Basic Metabolic Panel: Recent Labs  Lab 12/13/18 1230 12/18/18 1326  NA 129* 132*  K 4.2 4.1  CL 88* 90*  CO2 24 24  GLUCOSE 148* 164*  BUN 58* 69*  CREATININE 8.38* 8.67*  CALCIUM 9.2 9.2  PHOS 5.2* 4.6    CBC: Recent Labs  Lab 12/13/18 1229 12/18/18 1327 12/18/18 1642  WBC 9.0 8.3  --   HGB 7.5* 6.9* 8.7*  HCT 23.3* 21.3* 26.9*  MCV 94.0 94.2  --   PLT 429* 385  --     CBG: Recent Labs  Lab 12/17/18 2137 12/18/18 0639 12/18/18 1152 12/18/18 1726 12/18/18 2135  GLUCAP 157* 154* 146* 113* 229*   Family history.  Father with CAD.  Denies any cancer or diabetes  Brief HPI:    [name] is a 68 year old right-handed male with history of COPD who quit smoking 6 years ago, end-stage renal disease with hemodialysis, diabetes mellitus with peripheral neuropathy, hypertension.  Per chart review lives with his wife independent ADLs using a rolling walker for functional mobility.  One level home with ramped entrance.  Presented 12/01/2018 with ischemic right foot.  Per report patient turned hurting his right great toe approximately 1 week ago developed a black line on the bottom of the toe where x-rays performed showing a gaseous change concerning for necrotizing infection.  Limb was not felt to be salvageable and underwent  emergent right BKA 12/01/2018 per Dr. [name].  Hospital course complicated by pain.  Hemodialysis ongoing.  Carotid Dopplers done for evaluation of bruit showed left ICA stenosis 40 to 59% monitored.  Hospital course further complicated by acute on chronic anemia.  Subcutaneous heparin for DVT prophylaxis.  Therapy evaluations completed and patient was admitted  for a comprehensive rehab program.  Hospital Course: HALIM SURRETTE was admitted to rehab 12/03/2018 for inpatient therapies to consist of PT, ST and OT at least three hours five days a week. Past admission physiatrist, therapy team and rehab RN have worked together to provide customized collaborative inpatient rehab.  Pertaining to patient right BKA 12/01/2018 he would follow-up with vascular surgery.  [employer] prost[employer] consulted for stump shrinker.  Subcutaneous heparin for DVT prophylaxis.  He did remain on Plavix as prior to admission.  Pain management noted phantom pain maintained on Lyrica as well as the addition of Elavil.  Lidoderm patch as directed as well as oxycodone for breakthrough pain.  Mood stabilization with Risperdal and good results emotional support provided.  Skin with routine care he continued with Santyl for left plantar heel ulcer that was improving he was refusing PRAFO.  He was maintained on Keflex for wound coverage.  Acute on chronic anemia follow-up per hemodialysis.  Blood sugars monitored closely hemoglobin A1c 11.2 with full diabetic teaching and patient would follow-up with primary MD.  Blood pressure monitored no orthostasis.  He did have a history of COPD with remote tobacco abuse denied any increasing shortness of breath.  Remained on Lipitor for hyperlipidemia as well as Synthroid for hypothyroidism.  Drug-induced constipation with bowel program adjusted.  Physical exam.  Blood pressure 157/74 pulse 73 temperature 98.2 respirations 14 oxygen saturations 97% room air Constitutional.  Well-developed HEENT Head.   Normocephalic and atraumatic Eyes.  EOMs normal no discharge without nystagmus Respiratory effort normal no respiratory distress without wheeze GI exhibits no distention nontender without rebound Musculoskeletal right BKA with edema and appropriately tender Neurological.  Alert no distress upper extremities 5 out of 5 proximal to distal left stronger than right left lower extremity hip flexion knee extension 5 out of 5 ankle dorsiflexion 3+ out of 5 right lower extremity limited by pain.  Rehab course: During patient's stay in rehab weekly team conferences were held to monitor patient's progress, set goals and discuss barriers to discharge. At admission, patient required minimal assist supine to sit and sit to supine.  Moderate assist sit to stand.  Supervision upper body bathing moderate assist lower body bathing supervision upper body dressing max assist lower body dressing  He  has had improvement in activity tolerance, balance, postural control as well as ability to compensate for deficits. He has had improvement in functional use RUE/LUE  and RLE/LLE as well as improvement in awareness.  Working with energy conservation techniques.  Sessions including car transfers and wheelchair set up.  Transfers wheelchair to mat with contact-guard assist.  Wife ongoing and daughter for family teaching.  Perform sliding board transfers to wheelchair with set up.  Propels wheelchair supervision.  Performed up heel to left with contact-guard assist.  Perform slide board transfers with supervision that continue to improve with his family teaching.  Plan was discharged to home       Disposition: Discharge disposition: 01-Home or Self Care     Discharge to home   Diet: Diabetic diet  Special Instructions: No smoking driving or alcohol  Continue hemodialysis  santyl dressing left plantar heel cover with foam dressing twice daily  Medications at discharge. 1 Tylenol as needed 2 Elavil 10 mg  nightly 3 Lipitor 80 mg nightly 4 phoslo 2668 mg 3 times daily 5 Keflex 250 mg daily x4 doses 6.  Sensipar 30 mg daily 7.  Plavix 75 mg daily 8.  Lantus insulin 15 units nightly  9.  Synthroid 125 mcg p.o. daily 10.  Lidoderm patch as directed 11.  Melatonin 9 mg nightly 12.  Oxycodone 1 to 2 tablets every 4 hours as needed pain 13 Protonix 40 mg p.o. daily 14.  MiraLAX twice daily hold for loose stools 15.  Lyrica 150 mg p.o. 3 times daily 16.  Risperdal 0.25 mg p.o. daily 17.  Senokot S2 tablets p.o. nightly  Discharge Instructions    Ambulatory referral to Physical Medicine Rehab   Complete by:  As directed    Moderate complexity follow-up 1 to 2 weeks right BKA      Follow-up Information    Jamse Arn, MD Follow up.   Specialty:  Physical Medicine and Rehabilitation Why:  office to call for appointment Contact information: [place] [address] [phone]        [name], MD Follow up.   Specialties:  Vascular Surgery, Cardiology Why:  call for appointment Contact information: [place] [address] [phone]        Caryl Bis, MD Follow up.   Specialty:  Family Medicine Contact information: [address] [place] [address] [phone]           Signed: [name] 12/19/2018, 5:32 AM Patient seen and examined by me on day of discharge. Delice Lesch, MD, ABPMR

## 2018-12-19 LAB — BPAM RBC
Blood Product Expiration Date: 202005142359
ISSUE DATE / TIME: 202005071535
Unit Type and Rh: 6200

## 2018-12-19 LAB — TYPE AND SCREEN
ABO/RH(D): A POS
Antibody Screen: NEGATIVE
Unit division: 0

## 2018-12-19 LAB — GLUCOSE, CAPILLARY: Glucose-Capillary: 145 mg/dL — ABNORMAL HIGH (ref 70–99)

## 2018-12-19 MED ORDER — CALCIUM ACETATE (PHOS BINDER) 667 MG PO CAPS: 2668.0000 mg | ORAL_CAPSULE | Freq: Three times a day (TID) | ORAL | 0 refills | Status: DC

## 2018-12-19 MED ORDER — CINACALCET HCL 30 MG PO TABS: 30.0000 mg | ORAL_TABLET | Freq: Every evening | ORAL | 0 refills | Status: DC

## 2018-12-19 MED ORDER — OXYCODONE-ACETAMINOPHEN 5-325 MG PO TABS: 1.0000 | ORAL_TABLET | Freq: Four times a day (QID) | ORAL | 0 refills | Status: DC | PRN

## 2018-12-19 NOTE — Progress Notes (Signed)
Renal Navigator notes plan for patient discharge from CIR today. Renal Navigator has notified OP HD clinic in [place] and will fax discharge summary to assist with smooth transition from hospital back to clinic.  [name] Renal Navigator [phone]

## 2018-12-19 NOTE — Progress Notes (Signed)
Social Work  Discharge Note  The overall goal for the admission was met for:   Discharge location: Yes-HOME WITH WIFE AND DAUGHTER TO ASSIST-24 HR  Length of Stay: Yes-16 DAYS  Discharge activity level: Yes-SUPERVISION-MIN WHEELCHAIR LEVEL  Home/community participation: Yes  Services provided included: MD, RD, PT, OT, RN, CM, Pharmacy, Neuropsych and SW  Financial Services: Medicare and Private Insurance: [place]  Follow-up services arranged: Home Health: [facility], DME: [facility]-WHEELCHAIR AND 30 TRANSFER BOARD and Patient/Family has no preference for HH/DME agencies  Comments (or additional information):WIFE AND DAUGHTER [place], [place]. [facility] WIFE HAS STARTED APPLICATION FOR TRANSPORTATION TO HD.   Patient/Family verbalized understanding of follow-up arrangements: Yes  Individual responsible for coordination of the follow-up plan: DEBORAH-WFIE AND MICHELLE-DAUGHTER  Confirmed correct DME delivered: Elease Hashimoto 12/19/2018    Elease Hashimoto

## 2018-12-19 NOTE — Discharge Instructions (Signed)
Inpatient Rehab Discharge Instructions  [facility] Discharge date and time: No discharge date for patient encounter.   Activities/Precautions/ Functional Status: Activity: activity as tolerated Diet: renal diet Wound Care: keep wound clean and dry Functional status:  ___ No restrictions     ___ Walk up steps independently ___ 24/7 supervision/assistance   ___ Walk up steps with assistance ___ Intermittent supervision/assistance  ___ Bathe/dress independently ___ Walk with walker     __x_ Bathe/dress with assistance ___ Walk Independently    ___ Shower independently ___ Walk with assistance    ___ Shower with assistance ___ No alcohol     ___ Return to work/school ________  Special Instructions:  Continue ongoing outpatient hemodialysis with [facility] [place]:    Home Health:   PT, OT, RN  Agency:[facility]   [place]   Date of last service:01/19/2019  Medical Equipment/Items Ordered:WHEELCHAIR & [place]  Agency/Supplier:[facility]  [phone]  Other:[facility] APPLICATION-[phone] option one wife to follow up with this  GENERAL COMMUNITY RESOURCES FOR PATIENT/FAMILY: Support Groups:[facility]PPORT [facility] THE SECOND Thursday FROM 7:00-8:30 PM @ [facility] [place] [phone]  My questions have been answered and I understand these instructions. I will adhere to these goals and the provided educational materials after my discharge from the hospital.  Patient/Caregiver Signature _______________________________ Date __________  Clinician Signature _______________________________________ Date __________  Please bring this form and your medication list with you to all your follow-up doctor's appointments.

## 2018-12-19 NOTE — Progress Notes (Signed)
Patient was D/C'd from unit today with all personal belongings. All questions were answered and patient was taken off the unit to leave with his daughter and wife.Patient verbalizes understanding of instructions and will keep all follow up appointments. Doy Hutching, LPN

## 2018-12-19 NOTE — Progress Notes (Addendum)
Dr. [name] will see pt prior to discharge today.  Leontine Locket, [facility] 12/19/2018 8:22 AM   Pt with some erythema at distal stump.  He still has considerable edema.  Tough to know right now if this is early infection or inflammation from edema.  The shrinker he is using is fairly loose.  I replaced this with a more snug ace wrap.  Would continue full course of antibiotics for 14 days.  Pt has follow up scheduled. [place] for d/c from my standpoint  [name], MD [facility] Office: [phone] Pager: [phone]

## 2018-12-20 DIAGNOSIS — J449 Chronic obstructive pulmonary disease, unspecified: Secondary | ICD-10-CM | POA: Diagnosis not present

## 2018-12-20 DIAGNOSIS — I120 Hypertensive chronic kidney disease with stage 5 chronic kidney disease or end stage renal disease: Secondary | ICD-10-CM | POA: Diagnosis not present

## 2018-12-20 DIAGNOSIS — E1142 Type 2 diabetes mellitus with diabetic polyneuropathy: Secondary | ICD-10-CM | POA: Diagnosis not present

## 2018-12-20 DIAGNOSIS — Z992 Dependence on renal dialysis: Secondary | ICD-10-CM | POA: Diagnosis not present

## 2018-12-20 DIAGNOSIS — F329 Major depressive disorder, single episode, unspecified: Secondary | ICD-10-CM | POA: Diagnosis not present

## 2018-12-20 DIAGNOSIS — D631 Anemia in chronic kidney disease: Secondary | ICD-10-CM | POA: Diagnosis not present

## 2018-12-20 DIAGNOSIS — M1990 Unspecified osteoarthritis, unspecified site: Secondary | ICD-10-CM | POA: Diagnosis not present

## 2018-12-20 DIAGNOSIS — E11621 Type 2 diabetes mellitus with foot ulcer: Secondary | ICD-10-CM | POA: Diagnosis not present

## 2018-12-20 DIAGNOSIS — D509 Iron deficiency anemia, unspecified: Secondary | ICD-10-CM | POA: Diagnosis not present

## 2018-12-20 DIAGNOSIS — N2581 Secondary hyperparathyroidism of renal origin: Secondary | ICD-10-CM | POA: Diagnosis not present

## 2018-12-20 DIAGNOSIS — Z794 Long term (current) use of insulin: Secondary | ICD-10-CM | POA: Diagnosis not present

## 2018-12-20 DIAGNOSIS — E1151 Type 2 diabetes mellitus with diabetic peripheral angiopathy without gangrene: Secondary | ICD-10-CM | POA: Diagnosis not present

## 2018-12-20 DIAGNOSIS — F419 Anxiety disorder, unspecified: Secondary | ICD-10-CM | POA: Diagnosis not present

## 2018-12-20 DIAGNOSIS — Z9181 History of falling: Secondary | ICD-10-CM | POA: Diagnosis not present

## 2018-12-20 DIAGNOSIS — L97428 Non-pressure chronic ulcer of left heel and midfoot with other specified severity: Secondary | ICD-10-CM | POA: Diagnosis not present

## 2018-12-20 DIAGNOSIS — E039 Hypothyroidism, unspecified: Secondary | ICD-10-CM | POA: Diagnosis not present

## 2018-12-20 DIAGNOSIS — Z87891 Personal history of nicotine dependence: Secondary | ICD-10-CM | POA: Diagnosis not present

## 2018-12-20 DIAGNOSIS — E785 Hyperlipidemia, unspecified: Secondary | ICD-10-CM | POA: Diagnosis not present

## 2018-12-20 DIAGNOSIS — Z4781 Encounter for orthopedic aftercare following surgical amputation: Secondary | ICD-10-CM | POA: Diagnosis not present

## 2018-12-20 DIAGNOSIS — E1122 Type 2 diabetes mellitus with diabetic chronic kidney disease: Secondary | ICD-10-CM | POA: Diagnosis not present

## 2018-12-20 DIAGNOSIS — N186 End stage renal disease: Secondary | ICD-10-CM | POA: Diagnosis not present

## 2018-12-20 DIAGNOSIS — K219 Gastro-esophageal reflux disease without esophagitis: Secondary | ICD-10-CM | POA: Diagnosis not present

## 2018-12-20 DIAGNOSIS — Z89511 Acquired absence of right leg below knee: Secondary | ICD-10-CM | POA: Diagnosis not present

## 2018-12-22 ENCOUNTER — Telehealth: Payer: Self-pay | Admitting: *Deleted

## 2018-12-22 NOTE — Telephone Encounter (Signed)
Mr [name] has #5 oxycodone 5/325 left.  His Rx was #30 filled 12/18/18 1-2 q 4 hours.  (looks like [name] rewrote to be 1 q 6 hr but that is not what was filled at pharmacy since he sent the other first per PMP. Now he is going to be out.  Appt with you is 01/09/19. He is asking for a refill.

## 2018-12-22 NOTE — Telephone Encounter (Signed)
Transitional Care call-I spoke with Mr [name]    1. Are you/is patient experiencing any problems since coming home? Are there any questions regarding any aspect of care?NO 2. Are there any questions regarding medications administration/dosing? Are meds being taken as prescribed? Patient should review meds with caller to confirm He has medications. Some have been refused or changed.  He is not taking the Amitriptyline as he does not like how he feels (was on before), he went back to his Trujeo 85-88 units per scale instead of the Lantus, he did not want the lidoderm, he refused the risperadal. Others are received and taken as prescribed. His oxycodone acetaminophen 5/325 had directions 1-2 q 4 hours #30 filled 12/18/18.  He only has #5 pills left and is asking for more. I will send request to Dr Posey Pronto. 3. Have there been any falls?  NO 4. Has Home Health been to the house and/or have they contacted you? If not, have you tried to contact them? Can we help you contact them? Yes nursing has been out and he is waiting for therapist to call 5. Are bowels and bladder emptying properly? Are there any unexpected incontinence issues? If applicable, is patient following bowel/bladder programs? He urinates very little (goes to dialysis) and no problems with bowels. He goes to dialysis [employer] and Sat. 6. Any fevers, problems with breathing, unexpected pain? No fevers or breathing problems, still having pain 7. Are there any skin problems or new areas of breakdown? NO 8. Has the patient/family member arranged specialty MD follow up (ie cardiology/neurology/renal/surgical/etc)?  Can we help arrange? He has appts with Dr [name] (surgeon) and Dr Quillian Quince PCP. Appt given with DR Posey Pronto 01/09/19 which wil be a hospital follow up instead of TC due to date change because of conflicts with appt s and dialysis 9. Does the patient need any other services or support that we can help arrange? NO 10. Are caregivers following  through as expected in assisting the patient? YES 11. Has the patient quit smoking, drinking alcohol, or using drugs as recommended? N/A  Appointment Friday 01/09/19 @11 :20, arrive by 11:00 to see Dr Posey Pronto Address reviewed and alerted to look for packet in mail [address]

## 2018-12-22 NOTE — Telephone Encounter (Signed)
I called [facility] and they have the second Rx Linna Hoff wrote for the oxycodone 5/325 #30 1 Q 6 hours on hold.  Do you want them to release that one?

## 2018-12-22 NOTE — Telephone Encounter (Signed)
Yes please, and let the patient know that he needs to wean the medication as this will be his last refill.  Thank you.

## 2018-12-23 DIAGNOSIS — E11621 Type 2 diabetes mellitus with foot ulcer: Secondary | ICD-10-CM | POA: Diagnosis not present

## 2018-12-23 DIAGNOSIS — I120 Hypertensive chronic kidney disease with stage 5 chronic kidney disease or end stage renal disease: Secondary | ICD-10-CM | POA: Diagnosis not present

## 2018-12-23 DIAGNOSIS — Z4781 Encounter for orthopedic aftercare following surgical amputation: Secondary | ICD-10-CM | POA: Diagnosis not present

## 2018-12-23 DIAGNOSIS — D509 Iron deficiency anemia, unspecified: Secondary | ICD-10-CM | POA: Diagnosis not present

## 2018-12-23 DIAGNOSIS — Z992 Dependence on renal dialysis: Secondary | ICD-10-CM | POA: Diagnosis not present

## 2018-12-23 DIAGNOSIS — N186 End stage renal disease: Secondary | ICD-10-CM | POA: Diagnosis not present

## 2018-12-23 DIAGNOSIS — D631 Anemia in chronic kidney disease: Secondary | ICD-10-CM | POA: Diagnosis not present

## 2018-12-23 DIAGNOSIS — E1122 Type 2 diabetes mellitus with diabetic chronic kidney disease: Secondary | ICD-10-CM | POA: Diagnosis not present

## 2018-12-23 DIAGNOSIS — L97428 Non-pressure chronic ulcer of left heel and midfoot with other specified severity: Secondary | ICD-10-CM | POA: Diagnosis not present

## 2018-12-23 DIAGNOSIS — N2581 Secondary hyperparathyroidism of renal origin: Secondary | ICD-10-CM | POA: Diagnosis not present

## 2018-12-23 NOTE — Telephone Encounter (Addendum)
Notified Mrs Sek the RX is at pharmacy and to use sparingly only as needed as this is his last refill and notified pharmacy to release second Rx.

## 2018-12-24 DIAGNOSIS — E782 Mixed hyperlipidemia: Secondary | ICD-10-CM | POA: Diagnosis not present

## 2018-12-24 DIAGNOSIS — Z4781 Encounter for orthopedic aftercare following surgical amputation: Secondary | ICD-10-CM | POA: Diagnosis not present

## 2018-12-24 DIAGNOSIS — I120 Hypertensive chronic kidney disease with stage 5 chronic kidney disease or end stage renal disease: Secondary | ICD-10-CM | POA: Diagnosis not present

## 2018-12-24 DIAGNOSIS — Z6832 Body mass index (BMI) 32.0-32.9, adult: Secondary | ICD-10-CM | POA: Diagnosis not present

## 2018-12-24 DIAGNOSIS — E1142 Type 2 diabetes mellitus with diabetic polyneuropathy: Secondary | ICD-10-CM | POA: Diagnosis not present

## 2018-12-24 DIAGNOSIS — E1122 Type 2 diabetes mellitus with diabetic chronic kidney disease: Secondary | ICD-10-CM | POA: Diagnosis not present

## 2018-12-24 DIAGNOSIS — J449 Chronic obstructive pulmonary disease, unspecified: Secondary | ICD-10-CM | POA: Diagnosis not present

## 2018-12-24 DIAGNOSIS — N186 End stage renal disease: Secondary | ICD-10-CM | POA: Diagnosis not present

## 2018-12-24 DIAGNOSIS — I739 Peripheral vascular disease, unspecified: Secondary | ICD-10-CM | POA: Diagnosis not present

## 2018-12-24 DIAGNOSIS — L97428 Non-pressure chronic ulcer of left heel and midfoot with other specified severity: Secondary | ICD-10-CM | POA: Diagnosis not present

## 2018-12-24 DIAGNOSIS — Z992 Dependence on renal dialysis: Secondary | ICD-10-CM | POA: Diagnosis not present

## 2018-12-24 DIAGNOSIS — E11621 Type 2 diabetes mellitus with foot ulcer: Secondary | ICD-10-CM | POA: Diagnosis not present

## 2018-12-24 DIAGNOSIS — I10 Essential (primary) hypertension: Secondary | ICD-10-CM | POA: Diagnosis not present

## 2018-12-25 DIAGNOSIS — N186 End stage renal disease: Secondary | ICD-10-CM | POA: Diagnosis not present

## 2018-12-25 DIAGNOSIS — L97428 Non-pressure chronic ulcer of left heel and midfoot with other specified severity: Secondary | ICD-10-CM | POA: Diagnosis not present

## 2018-12-25 DIAGNOSIS — E11621 Type 2 diabetes mellitus with foot ulcer: Secondary | ICD-10-CM | POA: Diagnosis not present

## 2018-12-25 DIAGNOSIS — Z992 Dependence on renal dialysis: Secondary | ICD-10-CM | POA: Diagnosis not present

## 2018-12-25 DIAGNOSIS — N2581 Secondary hyperparathyroidism of renal origin: Secondary | ICD-10-CM | POA: Diagnosis not present

## 2018-12-25 DIAGNOSIS — E1122 Type 2 diabetes mellitus with diabetic chronic kidney disease: Secondary | ICD-10-CM | POA: Diagnosis not present

## 2018-12-25 DIAGNOSIS — I120 Hypertensive chronic kidney disease with stage 5 chronic kidney disease or end stage renal disease: Secondary | ICD-10-CM | POA: Diagnosis not present

## 2018-12-25 DIAGNOSIS — Z4781 Encounter for orthopedic aftercare following surgical amputation: Secondary | ICD-10-CM | POA: Diagnosis not present

## 2018-12-25 DIAGNOSIS — D509 Iron deficiency anemia, unspecified: Secondary | ICD-10-CM | POA: Diagnosis not present

## 2018-12-25 DIAGNOSIS — D631 Anemia in chronic kidney disease: Secondary | ICD-10-CM | POA: Diagnosis not present

## 2018-12-27 DIAGNOSIS — Z4781 Encounter for orthopedic aftercare following surgical amputation: Secondary | ICD-10-CM | POA: Diagnosis not present

## 2018-12-27 DIAGNOSIS — Z992 Dependence on renal dialysis: Secondary | ICD-10-CM | POA: Diagnosis not present

## 2018-12-27 DIAGNOSIS — N2581 Secondary hyperparathyroidism of renal origin: Secondary | ICD-10-CM | POA: Diagnosis not present

## 2018-12-27 DIAGNOSIS — L97428 Non-pressure chronic ulcer of left heel and midfoot with other specified severity: Secondary | ICD-10-CM | POA: Diagnosis not present

## 2018-12-27 DIAGNOSIS — E11621 Type 2 diabetes mellitus with foot ulcer: Secondary | ICD-10-CM | POA: Diagnosis not present

## 2018-12-27 DIAGNOSIS — D631 Anemia in chronic kidney disease: Secondary | ICD-10-CM | POA: Diagnosis not present

## 2018-12-27 DIAGNOSIS — E1122 Type 2 diabetes mellitus with diabetic chronic kidney disease: Secondary | ICD-10-CM | POA: Diagnosis not present

## 2018-12-27 DIAGNOSIS — N186 End stage renal disease: Secondary | ICD-10-CM | POA: Diagnosis not present

## 2018-12-27 DIAGNOSIS — D509 Iron deficiency anemia, unspecified: Secondary | ICD-10-CM | POA: Diagnosis not present

## 2018-12-27 DIAGNOSIS — I120 Hypertensive chronic kidney disease with stage 5 chronic kidney disease or end stage renal disease: Secondary | ICD-10-CM | POA: Diagnosis not present

## 2018-12-29 DIAGNOSIS — E1122 Type 2 diabetes mellitus with diabetic chronic kidney disease: Secondary | ICD-10-CM | POA: Diagnosis not present

## 2018-12-29 DIAGNOSIS — L97428 Non-pressure chronic ulcer of left heel and midfoot with other specified severity: Secondary | ICD-10-CM | POA: Diagnosis not present

## 2018-12-29 DIAGNOSIS — Z4781 Encounter for orthopedic aftercare following surgical amputation: Secondary | ICD-10-CM | POA: Diagnosis not present

## 2018-12-29 DIAGNOSIS — E11621 Type 2 diabetes mellitus with foot ulcer: Secondary | ICD-10-CM | POA: Diagnosis not present

## 2018-12-29 DIAGNOSIS — I120 Hypertensive chronic kidney disease with stage 5 chronic kidney disease or end stage renal disease: Secondary | ICD-10-CM | POA: Diagnosis not present

## 2018-12-29 DIAGNOSIS — N186 End stage renal disease: Secondary | ICD-10-CM | POA: Diagnosis not present

## 2018-12-30 ENCOUNTER — Telehealth: Payer: Self-pay

## 2018-12-30 DIAGNOSIS — N2581 Secondary hyperparathyroidism of renal origin: Secondary | ICD-10-CM | POA: Diagnosis not present

## 2018-12-30 DIAGNOSIS — N186 End stage renal disease: Secondary | ICD-10-CM | POA: Diagnosis not present

## 2018-12-30 DIAGNOSIS — Z992 Dependence on renal dialysis: Secondary | ICD-10-CM | POA: Diagnosis not present

## 2018-12-30 DIAGNOSIS — D631 Anemia in chronic kidney disease: Secondary | ICD-10-CM | POA: Diagnosis not present

## 2018-12-30 DIAGNOSIS — D509 Iron deficiency anemia, unspecified: Secondary | ICD-10-CM | POA: Diagnosis not present

## 2018-12-30 NOTE — Telephone Encounter (Signed)
[place] wiith OT called and said that patient wanted to know if he can get a shower and if he needs to keep his stump covered.   Called her back and advised her that he could shower ad the water would be good for the area so he does not need to cover (Per [place])   Reminded her of his appt on Thursday as well.   York Cerise, CMA

## 2018-12-31 ENCOUNTER — Telehealth (HOSPITAL_COMMUNITY): Payer: Self-pay | Admitting: Rehabilitation

## 2018-12-31 DIAGNOSIS — E162 Hypoglycemia, unspecified: Secondary | ICD-10-CM | POA: Diagnosis not present

## 2018-12-31 DIAGNOSIS — Z4781 Encounter for orthopedic aftercare following surgical amputation: Secondary | ICD-10-CM | POA: Diagnosis not present

## 2018-12-31 DIAGNOSIS — N186 End stage renal disease: Secondary | ICD-10-CM | POA: Diagnosis not present

## 2018-12-31 DIAGNOSIS — E11621 Type 2 diabetes mellitus with foot ulcer: Secondary | ICD-10-CM | POA: Diagnosis not present

## 2018-12-31 DIAGNOSIS — E1122 Type 2 diabetes mellitus with diabetic chronic kidney disease: Secondary | ICD-10-CM | POA: Diagnosis not present

## 2018-12-31 DIAGNOSIS — R609 Edema, unspecified: Secondary | ICD-10-CM | POA: Diagnosis not present

## 2018-12-31 DIAGNOSIS — Z6832 Body mass index (BMI) 32.0-32.9, adult: Secondary | ICD-10-CM | POA: Diagnosis not present

## 2018-12-31 DIAGNOSIS — L97428 Non-pressure chronic ulcer of left heel and midfoot with other specified severity: Secondary | ICD-10-CM | POA: Diagnosis not present

## 2018-12-31 DIAGNOSIS — I120 Hypertensive chronic kidney disease with stage 5 chronic kidney disease or end stage renal disease: Secondary | ICD-10-CM | POA: Diagnosis not present

## 2018-12-31 NOTE — Telephone Encounter (Signed)
The above patient or their representative was contacted and gave the following answers to these questions:         Do you have any of the following symptoms? No  Fever                    Cough                   Shortness of breath  Do  you have any of the following other symptoms? No   muscle pain         vomiting,        diarrhea        rash         weakness        red eye        abdominal pain         bruising          bruising or bleeding              joint pain           severe headache    Have you been in contact with someone who was or has been sick in the past 2 weeks? No  Yes                 Unsure                         Unable to assess   Does the person that you were in contact with have any of the following symptoms?   Cough         shortness of breath           muscle pain         vomiting,            diarrhea            rash            weakness           fever            red eye           abdominal pain           bruising  or  bleeding                joint pain                severe headache               Have you  or someone you have been in contact with traveled internationally in th last month? No        If yes, which countries?   Have you  or someone you have been in contact with traveled outside [place] in th last month? No         If yes, which state and city?   COMMENTS OR ACTION PLAN FOR THIS PATIENT:          

## 2019-01-01 ENCOUNTER — Other Ambulatory Visit: Payer: Self-pay

## 2019-01-01 ENCOUNTER — Encounter: Payer: Self-pay | Admitting: Vascular Surgery

## 2019-01-01 ENCOUNTER — Other Ambulatory Visit: Payer: Self-pay | Admitting: *Deleted

## 2019-01-01 ENCOUNTER — Encounter: Payer: Self-pay | Admitting: *Deleted

## 2019-01-01 ENCOUNTER — Encounter: Payer: Medicare Other | Admitting: Vascular Surgery

## 2019-01-01 ENCOUNTER — Other Ambulatory Visit: Payer: Self-pay | Admitting: Family Medicine

## 2019-01-01 ENCOUNTER — Ambulatory Visit (INDEPENDENT_AMBULATORY_CARE_PROVIDER_SITE_OTHER): Payer: Self-pay | Admitting: Vascular Surgery

## 2019-01-01 ENCOUNTER — Ambulatory Visit (HOSPITAL_COMMUNITY): Admission: RE | Admit: 2019-01-01 | Payer: Medicare Other | Source: Ambulatory Visit

## 2019-01-01 ENCOUNTER — Other Ambulatory Visit (HOSPITAL_COMMUNITY): Payer: Self-pay | Admitting: Family Medicine

## 2019-01-01 VITALS — BP 133/63 | HR 75 | Temp 97.4°F | Resp 20 | Ht 76.0 in | Wt 259.0 lb

## 2019-01-01 DIAGNOSIS — Z992 Dependence on renal dialysis: Secondary | ICD-10-CM | POA: Diagnosis not present

## 2019-01-01 DIAGNOSIS — N186 End stage renal disease: Secondary | ICD-10-CM

## 2019-01-01 DIAGNOSIS — I739 Peripheral vascular disease, unspecified: Secondary | ICD-10-CM

## 2019-01-01 DIAGNOSIS — N2581 Secondary hyperparathyroidism of renal origin: Secondary | ICD-10-CM | POA: Diagnosis not present

## 2019-01-01 DIAGNOSIS — D631 Anemia in chronic kidney disease: Secondary | ICD-10-CM | POA: Diagnosis not present

## 2019-01-01 DIAGNOSIS — R600 Localized edema: Secondary | ICD-10-CM

## 2019-01-01 DIAGNOSIS — D509 Iron deficiency anemia, unspecified: Secondary | ICD-10-CM | POA: Diagnosis not present

## 2019-01-01 NOTE — Progress Notes (Signed)
Patient is a 68 year old male who returns today for postoperative follow-up after recent right below-knee amputation.  He denies any incisional drainage.  He has minimal pain.  He also complains today of left upper extremity swelling.  He has AV fistula in the left arm.  His dialysis today is Tuesday Thursday Saturday.  Physical exam:  Vitals:   01/01/19 1418  BP: 133/63  Pulse: 75  Resp: 20  Temp: (!) 97.4 F (36.3 C)  SpO2: 97%  Weight: 259 lb (117.5 kg)  Height: 6\' 4"  (1.93 m)    Right lower extremity: Healing right below-knee amputation still with some edema right lateral corner has a slight area of separation.  Otherwise it is healing well.  Staples were removed today.  Left upper extremity: Pulsatile left upper arm AV fistula left arm swelling extending from the shoulder into the hand approximately 10% larger than the right  Assessment: #1 healing right below-knee amputation staples removed today shrinker prescribed he will follow-up on an as-needed basis for this.  He was counseled to treat this very gently over the next few weeks until it is completely healed.  He is going to see [employer] regarding prosthesis.  2.  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  Patient will be scheduled for a left upper extremity fistulogram possible intervention January 16, 2019.  Risk benefits possible complications of procedure details were discussed with the patient today include not limited to bleeding infection vessel injury.  He understands and agrees to proceed.  [name], MD [facility] Office: [phone] Pager: [phone]

## 2019-01-01 NOTE — H&P (View-Only) (Signed)
Patient is a 68 year old male who returns today for postoperative follow-up after recent right below-knee amputation.  He denies any incisional drainage.  He has minimal pain.  He also complains today of left upper extremity swelling.  He has AV fistula in the left arm.  His dialysis today is Tuesday Thursday Saturday.  Physical exam:  Vitals:   01/01/19 1418  BP: 133/63  Pulse: 75  Resp: 20  Temp: (!) 97.4 F (36.3 C)  SpO2: 97%  Weight: 259 lb (117.5 kg)  Height: 6\' 4"  (1.93 m)    Right lower extremity: Healing right below-knee amputation still with some edema right lateral corner has a slight area of separation.  Otherwise it is healing well.  Staples were removed today.  Left upper extremity: Pulsatile left upper arm AV fistula left arm swelling extending from the shoulder into the hand approximately 10% larger than the right  Assessment: #1 healing right below-knee amputation staples removed today shrinker prescribed he will follow-up on an as-needed basis for this.  He was counseled to treat this very gently over the next few weeks until it is completely healed.  He is going to see biotech regarding prosthesis.  2.  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  Patient will be scheduled for a left upper extremity fistulogram possible intervention January 16, 2019.  Risk benefits possible complications of procedure details were discussed with the patient today include not limited to bleeding infection vessel injury.  He understands and agrees to proceed.  [name], MD [facility] Office: [phone] Pager: [phone]

## 2019-01-02 ENCOUNTER — Encounter: Payer: Medicare Other | Admitting: Physical Medicine & Rehabilitation

## 2019-01-02 DIAGNOSIS — N186 End stage renal disease: Secondary | ICD-10-CM | POA: Diagnosis not present

## 2019-01-02 DIAGNOSIS — I120 Hypertensive chronic kidney disease with stage 5 chronic kidney disease or end stage renal disease: Secondary | ICD-10-CM | POA: Diagnosis not present

## 2019-01-02 DIAGNOSIS — E11621 Type 2 diabetes mellitus with foot ulcer: Secondary | ICD-10-CM | POA: Diagnosis not present

## 2019-01-02 DIAGNOSIS — Z4781 Encounter for orthopedic aftercare following surgical amputation: Secondary | ICD-10-CM | POA: Diagnosis not present

## 2019-01-02 DIAGNOSIS — L97428 Non-pressure chronic ulcer of left heel and midfoot with other specified severity: Secondary | ICD-10-CM | POA: Diagnosis not present

## 2019-01-02 DIAGNOSIS — E1122 Type 2 diabetes mellitus with diabetic chronic kidney disease: Secondary | ICD-10-CM | POA: Diagnosis not present

## 2019-01-03 DIAGNOSIS — D631 Anemia in chronic kidney disease: Secondary | ICD-10-CM | POA: Diagnosis not present

## 2019-01-03 DIAGNOSIS — E11621 Type 2 diabetes mellitus with foot ulcer: Secondary | ICD-10-CM | POA: Diagnosis not present

## 2019-01-03 DIAGNOSIS — Z992 Dependence on renal dialysis: Secondary | ICD-10-CM | POA: Diagnosis not present

## 2019-01-03 DIAGNOSIS — Z4781 Encounter for orthopedic aftercare following surgical amputation: Secondary | ICD-10-CM | POA: Diagnosis not present

## 2019-01-03 DIAGNOSIS — E1122 Type 2 diabetes mellitus with diabetic chronic kidney disease: Secondary | ICD-10-CM | POA: Diagnosis not present

## 2019-01-03 DIAGNOSIS — N186 End stage renal disease: Secondary | ICD-10-CM | POA: Diagnosis not present

## 2019-01-03 DIAGNOSIS — I120 Hypertensive chronic kidney disease with stage 5 chronic kidney disease or end stage renal disease: Secondary | ICD-10-CM | POA: Diagnosis not present

## 2019-01-03 DIAGNOSIS — L97428 Non-pressure chronic ulcer of left heel and midfoot with other specified severity: Secondary | ICD-10-CM | POA: Diagnosis not present

## 2019-01-03 DIAGNOSIS — N2581 Secondary hyperparathyroidism of renal origin: Secondary | ICD-10-CM | POA: Diagnosis not present

## 2019-01-03 DIAGNOSIS — D509 Iron deficiency anemia, unspecified: Secondary | ICD-10-CM | POA: Diagnosis not present

## 2019-01-05 DIAGNOSIS — E1122 Type 2 diabetes mellitus with diabetic chronic kidney disease: Secondary | ICD-10-CM | POA: Diagnosis not present

## 2019-01-05 DIAGNOSIS — N186 End stage renal disease: Secondary | ICD-10-CM | POA: Diagnosis not present

## 2019-01-05 DIAGNOSIS — I120 Hypertensive chronic kidney disease with stage 5 chronic kidney disease or end stage renal disease: Secondary | ICD-10-CM | POA: Diagnosis not present

## 2019-01-05 DIAGNOSIS — L97428 Non-pressure chronic ulcer of left heel and midfoot with other specified severity: Secondary | ICD-10-CM | POA: Diagnosis not present

## 2019-01-05 DIAGNOSIS — Z4781 Encounter for orthopedic aftercare following surgical amputation: Secondary | ICD-10-CM | POA: Diagnosis not present

## 2019-01-05 DIAGNOSIS — E11621 Type 2 diabetes mellitus with foot ulcer: Secondary | ICD-10-CM | POA: Diagnosis not present

## 2019-01-06 ENCOUNTER — Ambulatory Visit (HOSPITAL_COMMUNITY): Payer: Medicare Other

## 2019-01-06 ENCOUNTER — Encounter (HOSPITAL_COMMUNITY): Payer: Self-pay

## 2019-01-06 DIAGNOSIS — Z992 Dependence on renal dialysis: Secondary | ICD-10-CM | POA: Diagnosis not present

## 2019-01-06 DIAGNOSIS — D631 Anemia in chronic kidney disease: Secondary | ICD-10-CM | POA: Diagnosis not present

## 2019-01-06 DIAGNOSIS — N2581 Secondary hyperparathyroidism of renal origin: Secondary | ICD-10-CM | POA: Diagnosis not present

## 2019-01-06 DIAGNOSIS — N186 End stage renal disease: Secondary | ICD-10-CM | POA: Diagnosis not present

## 2019-01-06 DIAGNOSIS — D509 Iron deficiency anemia, unspecified: Secondary | ICD-10-CM | POA: Diagnosis not present

## 2019-01-07 DIAGNOSIS — E11621 Type 2 diabetes mellitus with foot ulcer: Secondary | ICD-10-CM | POA: Diagnosis not present

## 2019-01-07 DIAGNOSIS — I120 Hypertensive chronic kidney disease with stage 5 chronic kidney disease or end stage renal disease: Secondary | ICD-10-CM | POA: Diagnosis not present

## 2019-01-07 DIAGNOSIS — Z4781 Encounter for orthopedic aftercare following surgical amputation: Secondary | ICD-10-CM | POA: Diagnosis not present

## 2019-01-07 DIAGNOSIS — E1122 Type 2 diabetes mellitus with diabetic chronic kidney disease: Secondary | ICD-10-CM | POA: Diagnosis not present

## 2019-01-07 DIAGNOSIS — L97428 Non-pressure chronic ulcer of left heel and midfoot with other specified severity: Secondary | ICD-10-CM | POA: Diagnosis not present

## 2019-01-07 DIAGNOSIS — N186 End stage renal disease: Secondary | ICD-10-CM | POA: Diagnosis not present

## 2019-01-08 DIAGNOSIS — N186 End stage renal disease: Secondary | ICD-10-CM | POA: Diagnosis not present

## 2019-01-08 DIAGNOSIS — D509 Iron deficiency anemia, unspecified: Secondary | ICD-10-CM | POA: Diagnosis not present

## 2019-01-08 DIAGNOSIS — N2581 Secondary hyperparathyroidism of renal origin: Secondary | ICD-10-CM | POA: Diagnosis not present

## 2019-01-08 DIAGNOSIS — Z992 Dependence on renal dialysis: Secondary | ICD-10-CM | POA: Diagnosis not present

## 2019-01-08 DIAGNOSIS — D631 Anemia in chronic kidney disease: Secondary | ICD-10-CM | POA: Diagnosis not present

## 2019-01-09 ENCOUNTER — Encounter: Payer: Medicare Other | Admitting: Physical Medicine & Rehabilitation

## 2019-01-09 DIAGNOSIS — Z4781 Encounter for orthopedic aftercare following surgical amputation: Secondary | ICD-10-CM | POA: Diagnosis not present

## 2019-01-09 DIAGNOSIS — N186 End stage renal disease: Secondary | ICD-10-CM | POA: Diagnosis not present

## 2019-01-09 DIAGNOSIS — L97428 Non-pressure chronic ulcer of left heel and midfoot with other specified severity: Secondary | ICD-10-CM | POA: Diagnosis not present

## 2019-01-09 DIAGNOSIS — E1122 Type 2 diabetes mellitus with diabetic chronic kidney disease: Secondary | ICD-10-CM | POA: Diagnosis not present

## 2019-01-09 DIAGNOSIS — I120 Hypertensive chronic kidney disease with stage 5 chronic kidney disease or end stage renal disease: Secondary | ICD-10-CM | POA: Diagnosis not present

## 2019-01-09 DIAGNOSIS — E11621 Type 2 diabetes mellitus with foot ulcer: Secondary | ICD-10-CM | POA: Diagnosis not present

## 2019-01-10 DIAGNOSIS — D631 Anemia in chronic kidney disease: Secondary | ICD-10-CM | POA: Diagnosis not present

## 2019-01-10 DIAGNOSIS — N2581 Secondary hyperparathyroidism of renal origin: Secondary | ICD-10-CM | POA: Diagnosis not present

## 2019-01-10 DIAGNOSIS — Z992 Dependence on renal dialysis: Secondary | ICD-10-CM | POA: Diagnosis not present

## 2019-01-10 DIAGNOSIS — D509 Iron deficiency anemia, unspecified: Secondary | ICD-10-CM | POA: Diagnosis not present

## 2019-01-10 DIAGNOSIS — N186 End stage renal disease: Secondary | ICD-10-CM | POA: Diagnosis not present

## 2019-01-11 DIAGNOSIS — Z992 Dependence on renal dialysis: Secondary | ICD-10-CM | POA: Diagnosis not present

## 2019-01-11 DIAGNOSIS — N186 End stage renal disease: Secondary | ICD-10-CM | POA: Diagnosis not present

## 2019-01-12 DIAGNOSIS — D631 Anemia in chronic kidney disease: Secondary | ICD-10-CM | POA: Diagnosis not present

## 2019-01-12 DIAGNOSIS — E1122 Type 2 diabetes mellitus with diabetic chronic kidney disease: Secondary | ICD-10-CM | POA: Diagnosis not present

## 2019-01-12 DIAGNOSIS — Z4781 Encounter for orthopedic aftercare following surgical amputation: Secondary | ICD-10-CM | POA: Diagnosis not present

## 2019-01-12 DIAGNOSIS — L97428 Non-pressure chronic ulcer of left heel and midfoot with other specified severity: Secondary | ICD-10-CM | POA: Diagnosis not present

## 2019-01-12 DIAGNOSIS — Z992 Dependence on renal dialysis: Secondary | ICD-10-CM | POA: Diagnosis not present

## 2019-01-12 DIAGNOSIS — I120 Hypertensive chronic kidney disease with stage 5 chronic kidney disease or end stage renal disease: Secondary | ICD-10-CM | POA: Diagnosis not present

## 2019-01-12 DIAGNOSIS — D509 Iron deficiency anemia, unspecified: Secondary | ICD-10-CM | POA: Diagnosis not present

## 2019-01-12 DIAGNOSIS — E11621 Type 2 diabetes mellitus with foot ulcer: Secondary | ICD-10-CM | POA: Diagnosis not present

## 2019-01-12 DIAGNOSIS — N186 End stage renal disease: Secondary | ICD-10-CM | POA: Diagnosis not present

## 2019-01-12 DIAGNOSIS — N2581 Secondary hyperparathyroidism of renal origin: Secondary | ICD-10-CM | POA: Diagnosis not present

## 2019-01-13 ENCOUNTER — Other Ambulatory Visit: Payer: Self-pay

## 2019-01-13 ENCOUNTER — Other Ambulatory Visit (HOSPITAL_COMMUNITY)
Admission: RE | Admit: 2019-01-13 | Discharge: 2019-01-13 | Disposition: A | Discharge status: Home / Self Care | Payer: Medicare Other | Source: Ambulatory Visit | Attending: Vascular Surgery | Admitting: Vascular Surgery

## 2019-01-13 DIAGNOSIS — Z1159 Encounter for screening for other viral diseases: Secondary | ICD-10-CM | POA: Insufficient documentation

## 2019-01-13 DIAGNOSIS — Z992 Dependence on renal dialysis: Secondary | ICD-10-CM | POA: Diagnosis not present

## 2019-01-13 DIAGNOSIS — D631 Anemia in chronic kidney disease: Secondary | ICD-10-CM | POA: Diagnosis not present

## 2019-01-13 DIAGNOSIS — D509 Iron deficiency anemia, unspecified: Secondary | ICD-10-CM | POA: Diagnosis not present

## 2019-01-13 DIAGNOSIS — N186 End stage renal disease: Secondary | ICD-10-CM | POA: Diagnosis not present

## 2019-01-13 DIAGNOSIS — N2581 Secondary hyperparathyroidism of renal origin: Secondary | ICD-10-CM | POA: Diagnosis not present

## 2019-01-14 ENCOUNTER — Encounter: Payer: Medicare Other | Attending: Physical Medicine & Rehabilitation | Admitting: Physical Medicine & Rehabilitation

## 2019-01-14 ENCOUNTER — Encounter: Payer: Self-pay | Admitting: Physical Medicine & Rehabilitation

## 2019-01-14 VITALS — BP 147/78 | HR 78 | Ht 76.0 in | Wt 264.0 lb

## 2019-01-14 DIAGNOSIS — E1142 Type 2 diabetes mellitus with diabetic polyneuropathy: Secondary | ICD-10-CM

## 2019-01-14 DIAGNOSIS — E1122 Type 2 diabetes mellitus with diabetic chronic kidney disease: Secondary | ICD-10-CM

## 2019-01-14 DIAGNOSIS — Z4781 Encounter for orthopedic aftercare following surgical amputation: Secondary | ICD-10-CM | POA: Diagnosis not present

## 2019-01-14 DIAGNOSIS — G479 Sleep disorder, unspecified: Secondary | ICD-10-CM | POA: Diagnosis not present

## 2019-01-14 DIAGNOSIS — E11621 Type 2 diabetes mellitus with foot ulcer: Secondary | ICD-10-CM | POA: Diagnosis not present

## 2019-01-14 DIAGNOSIS — I120 Hypertensive chronic kidney disease with stage 5 chronic kidney disease or end stage renal disease: Secondary | ICD-10-CM | POA: Diagnosis not present

## 2019-01-14 DIAGNOSIS — Z89511 Acquired absence of right leg below knee: Secondary | ICD-10-CM

## 2019-01-14 DIAGNOSIS — G546 Phantom limb syndrome with pain: Secondary | ICD-10-CM

## 2019-01-14 DIAGNOSIS — N186 End stage renal disease: Secondary | ICD-10-CM | POA: Diagnosis not present

## 2019-01-14 DIAGNOSIS — G894 Chronic pain syndrome: Secondary | ICD-10-CM | POA: Diagnosis not present

## 2019-01-14 DIAGNOSIS — G8918 Other acute postprocedural pain: Secondary | ICD-10-CM | POA: Diagnosis not present

## 2019-01-14 DIAGNOSIS — L97428 Non-pressure chronic ulcer of left heel and midfoot with other specified severity: Secondary | ICD-10-CM | POA: Diagnosis not present

## 2019-01-14 DIAGNOSIS — Z992 Dependence on renal dialysis: Secondary | ICD-10-CM

## 2019-01-14 DIAGNOSIS — M792 Neuralgia and neuritis, unspecified: Secondary | ICD-10-CM

## 2019-01-14 LAB — NOVEL CORONAVIRUS, NAA (HOSP ORDER, SEND-OUT TO REF LAB; TAT 18-24 HRS): SARS-CoV-2, NAA: NOT DETECTED

## 2019-01-14 NOTE — Progress Notes (Signed)
Subjective:    Patient ID: [name], male    DOB: May 14, 1951, 68 y.o.   MRN: [id number]  TELEHEALTH NOTE  Due to national recommendations of social distancing due to COVID 19, an audio/video telehealth visit is felt to be most appropriate for this patient at this time.  See Chart message from today for the patient's consent to telehealth from [place].     I verified that I am speaking with the correct person using two identifiers.  Location of patient: Home Location of provider: Office Method of communication: WebEx Names of participants : Zorita Pang scheduling, Marland Mcalpine obtaining consent and vitals if available Established patient Time spent on call: 20 minutes  HPI 68 year old right-handed male with history of COPD who quit smoking 6 years ago, end-stage renal disease with hemodialysis, diabetes mellitus with peripheral neuropathy, hypertension presents for hospital follow-up after receiving CIR for right BKA.    At discharge he was instructed to follow up with Vascular, which he did and staples were removed. He had a visit with PCP. Pain is between 5-6. He continues to go to HD. CBGs have been relatively controlled. Bowel movements have normalized.  Not sleeping well due to pain. Denies falls.   Therapies: 3/week DME: Previously owned Mobility: Wheelchair at all times  Pain Inventory Average Pain 5 Pain Right Now 5 My pain is constant, burning, stabbing, aching and ghost pains  In the last 24 hours, has pain interfered with the following? General activity 5 Relation with others 5 Enjoyment of life 5 What TIME of day is your pain at its worst? night Sleep (in general) Poor  Pain is worse with: unsure and some activites Pain improves with: medication Relief from Meds: 8  Mobility use a wheelchair needs help with transfers  Function disabled: date disabled 2017  Neuro/Psych numbness tingling trouble walking  depression anxiety  Prior Studies Any changes since last visit?  no  Physicians involved in your care Any changes since last visit?  no   Family History  Problem Relation Age of Onset  . Heart disease Father        Heart Disease before age 56   Social History   Socioeconomic History  . Marital status: Married    Spouse name: Not on file  . Number of children: Not on file  . Years of education: Not on file  . Highest education level: Not on file  Occupational History  . Not on file  Social Needs  . Financial resource strain: Not on file  . Food insecurity:    Worry: Not on file    Inability: Not on file  . Transportation needs:    Medical: Not on file    Non-medical: Not on file  Tobacco Use  . Smoking status: Former Smoker    Years: 10.00    Types: Cigars    Last attempt to quit: 03/13/2012    Years since quitting: 6.8  . Smokeless tobacco: Never Used  Substance and Sexual Activity  . Alcohol use: No    Alcohol/week: 0.0 standard drinks  . Drug use: No  . Sexual activity: Never  Lifestyle  . Physical activity:    Days per week: Not on file    Minutes per session: Not on file  . Stress: Not on file  Relationships  . Social connections:    Talks on phone: Not on file    Gets together: Not on file    Attends  religious service: Not on file    Active member of club or organization: Not on file    Attends meetings of clubs or organizations: Not on file    Relationship status: Not on file  Other Topics Concern  . Not on file  Social History Narrative  . Not on file   Past Surgical History:  Procedure Laterality Date  . AMPUTATION Right 12/01/2018   Procedure: RIGHT AMPUTATION BELOW KNEE;  Surgeon: [name], MD;  [facility];  Service: Vascular;  Laterality: Right;  . AV FISTULA PLACEMENT  2012      left arm   . AV FISTULA PLACEMENT Left 12/18/2012   Procedure: ARTERIOVENOUS (AV) FISTULA CREATION;  Surgeon: Angelia Mould, MD;  Location:  [place];  Service: Vascular;  Laterality: Left;  . COLONOSCOPY  10/26/2011   Procedure: COLONOSCOPY;  Surgeon: Rogene Houston, MD;  Location: [facility] ENDO SUITE;  Service: Endoscopy;  Laterality: N/A;  730  . EMBOLECTOMY Left 12/09/2012   Procedure: EMBOLECTOMY;  Surgeon: Serafina Mitchell, MD;  Location: [facility] CATH LAB;  Service: Cardiovascular;  Laterality: Left;  left arm venous embolization  . ESOPHAGOGASTRODUODENOSCOPY (EGD) WITH ESOPHAGEAL DILATION N/A 04/23/2013   Procedure: ESOPHAGOGASTRODUODENOSCOPY (EGD) WITH ESOPHAGEAL DILATION;  Surgeon: Rogene Houston, MD;  Location: [facility];  Service: Endoscopy;  Laterality: N/A;  200-moved to 930   . FISTULA SUPERFICIALIZATION Left 06/18/2013   Procedure: FISTULA SUPERFICIALIZATION & LIGATION BRANCH X 1;  Surgeon: Mal Misty, MD;  Location: [place];  Service: Vascular;  Laterality: Left;  . HEMATOMA EVACUATION Right 02/11/2017   Procedure: EVACUATION HEMATOMA RIGHT GROIN, Repair of Right Pseudo-anerysm.;  Surgeon: [name], MD;  Location: [facility] OR;  Service: Vascular;  Laterality: Right;  . INGUINAL HERNIA REPAIR     ,  times   2  . INSERTION OF DIALYSIS CATHETER Left 12/18/2012   Procedure: INSERTION OF DIALYSIS CATHETER;  Surgeon: Angelia Mould, MD;  Location: [facility];  Service: Vascular;  Laterality: Left;  . KNEE ARTHROSCOPY  2011   Right Knee  . LOWER EXTREMITY ANGIOGRAPHY N/A 02/11/2017   Procedure: Lower Extremity Angiography;  Surgeon: Lorretta Harp, MD;  Location: [place] [facility] CV LAB;  Service: Cardiovascular;  Laterality: N/A;  . PERIPHERAL ATHRECTOMY  02/11/2017  . PERIPHERAL VASCULAR ATHERECTOMY Left 02/11/2017   Procedure: Peripheral Vascular Atherectomy;  Surgeon: Lorretta Harp, MD;  Location: [place] CV LAB;  Service: Cardiovascular;  Laterality: Left;  . REVISON OF ARTERIOVENOUS FISTULA Left 11/24/2438   Procedure: PLICATION OF LEFT BRACHIOCEPHALIC ARTERIOVENOUS FISTULA;  Surgeon: Conrad Farmington, MD;  Location: [place];   Service: Vascular;  Laterality: Left;  . SHUNTOGRAM N/A 12/09/2012   Procedure: fistulogram;  Surgeon: Serafina Mitchell, MD;  Location: [facility] CATH LAB;  Service: Cardiovascular;  Laterality: N/A;  . SHUNTOGRAM Left 06/03/2013   Procedure: Fistulogram;  Surgeon: Serafina Mitchell, MD;  Location: [facility] CATH LAB;  Service: Cardiovascular;  Laterality: Left;  . THROMBECTOMY W/ EMBOLECTOMY Left 12/11/2012   Procedure: THROMBECTOMY ARTERIOVENOUS FISTULA;  Surgeon: Serafina Mitchell, MD;  Location: [place];  Service: Vascular;  Laterality: Left;  . TONSILLECTOMY     Past Medical History:  Diagnosis Date  . Anemia   . Anxiety   . Arthritis   . Chronic kidney disease   . COPD (chronic obstructive pulmonary disease) ([place])    Pt denies  . Depression   . Diabetes mellitus without complication ([place])   . GERD (gastroesophageal  reflux disease)   . H/O hiatal hernia   . Headache(784.0)    Hx: Migraines  . Hypertension   . Neuropathy   . Numbness of toes    toes and feet  . Pneumonia    Hx: of several times  . Renal insufficiency   . Shortness of breath dyspnea   . Type 2 diabetes mellitus ([facility])    BP (!) 147/78   Pulse 78   Ht 6\' 4"  (1.93 m)   Wt 264 lb (119.7 kg)   BMI 32.14 kg/m   Opioid Risk Score:   Fall Risk Score:  `1  Depression screen PHQ 2/9  No flowsheet data found.   Review of Systems  Constitutional: Negative.   HENT: Negative.   Eyes: Negative.   Respiratory: Negative.   Cardiovascular: Negative.   Gastrointestinal: Negative.   Endocrine: Negative.   Musculoskeletal: Positive for gait problem and myalgias.  Skin: Negative.   Allergic/Immunologic: Negative.   Neurological: Positive for numbness.  Hematological: Negative.   Psychiatric/Behavioral: Positive for dysphoric mood. The patient is nervous/anxious.   All other systems reviewed and are negative.      Objective:   Physical Exam Gen: NAD. Pulm: Effort normal Neuro: Alert and oriented    Assessment & Plan:   68 year old right-handed male with history of COPD who quit smoking 6 years ago, end-stage renal disease with hemodialysis, diabetes mellitus with peripheral neuropathy, hypertension presents for hospital follow-up after receiving CIR for right BKA.    1.   Decreased functional mobility secondary to right below-knee amputation 12/01/2018.   [employer] prosthetics   Cont therapies  Cont follow up with Vascular   2. Pain Management/Chronic pain syndrome:   Chronic back pain-was seeing a pain management center, but stopped going.  Cont desensitization techniques  Lidoderm patches ineffective             Cont Lyrica 150 mg 3 times a day per PCP  Elavil 10 was d/ced by patient and does not want to take even though benefits in hospital  Wants Oxycodone, encouraged alternative medications  3. End-stage renal disease.   Recs per Nephro  4. Diabetes mellitus and peripheral neuropathy. HbA1c 11.2 on 4/16.              Cont meds  Relatively controlled at present  5. Gait abnormality  Cont therapies  Cont wheelchair for safety  6. Sleep disturbance  See #2  Meds reviewed Refills reviewed All questions answered

## 2019-01-15 DIAGNOSIS — Z992 Dependence on renal dialysis: Secondary | ICD-10-CM | POA: Diagnosis not present

## 2019-01-15 DIAGNOSIS — D509 Iron deficiency anemia, unspecified: Secondary | ICD-10-CM | POA: Diagnosis not present

## 2019-01-15 DIAGNOSIS — N2581 Secondary hyperparathyroidism of renal origin: Secondary | ICD-10-CM | POA: Diagnosis not present

## 2019-01-15 DIAGNOSIS — D631 Anemia in chronic kidney disease: Secondary | ICD-10-CM | POA: Diagnosis not present

## 2019-01-15 DIAGNOSIS — N186 End stage renal disease: Secondary | ICD-10-CM | POA: Diagnosis not present

## 2019-01-16 ENCOUNTER — Other Ambulatory Visit: Payer: Self-pay

## 2019-01-16 ENCOUNTER — Telehealth: Payer: Self-pay | Admitting: *Deleted

## 2019-01-16 ENCOUNTER — Encounter (HOSPITAL_COMMUNITY): Payer: Self-pay | Admitting: Vascular Surgery

## 2019-01-16 ENCOUNTER — Ambulatory Visit (HOSPITAL_COMMUNITY)
Admission: RE | Admit: 2019-01-16 | Discharge: 2019-01-16 | Disposition: A | Discharge status: Home / Self Care | Payer: Medicare Other | Attending: Vascular Surgery | Admitting: Vascular Surgery

## 2019-01-16 ENCOUNTER — Encounter (HOSPITAL_COMMUNITY)
Admission: RE | Disposition: A | Discharge status: Home / Self Care | Payer: Self-pay | Source: Home / Self Care | Attending: Vascular Surgery

## 2019-01-16 DIAGNOSIS — R600 Localized edema: Secondary | ICD-10-CM | POA: Insufficient documentation

## 2019-01-16 DIAGNOSIS — I120 Hypertensive chronic kidney disease with stage 5 chronic kidney disease or end stage renal disease: Secondary | ICD-10-CM | POA: Diagnosis not present

## 2019-01-16 DIAGNOSIS — Z89511 Acquired absence of right leg below knee: Secondary | ICD-10-CM | POA: Diagnosis not present

## 2019-01-16 DIAGNOSIS — T82898A Other specified complication of vascular prosthetic devices, implants and grafts, initial encounter: Secondary | ICD-10-CM | POA: Diagnosis not present

## 2019-01-16 DIAGNOSIS — I87302 Chronic venous hypertension (idiopathic) without complications of left lower extremity: Secondary | ICD-10-CM | POA: Diagnosis not present

## 2019-01-16 DIAGNOSIS — E1122 Type 2 diabetes mellitus with diabetic chronic kidney disease: Secondary | ICD-10-CM | POA: Diagnosis not present

## 2019-01-16 DIAGNOSIS — L97428 Non-pressure chronic ulcer of left heel and midfoot with other specified severity: Secondary | ICD-10-CM | POA: Diagnosis not present

## 2019-01-16 DIAGNOSIS — Z992 Dependence on renal dialysis: Secondary | ICD-10-CM | POA: Diagnosis not present

## 2019-01-16 DIAGNOSIS — Z4781 Encounter for orthopedic aftercare following surgical amputation: Secondary | ICD-10-CM | POA: Diagnosis not present

## 2019-01-16 DIAGNOSIS — E11621 Type 2 diabetes mellitus with foot ulcer: Secondary | ICD-10-CM | POA: Diagnosis not present

## 2019-01-16 DIAGNOSIS — N186 End stage renal disease: Secondary | ICD-10-CM | POA: Diagnosis not present

## 2019-01-16 HISTORY — PX: A/V FISTULAGRAM: CATH118298

## 2019-01-16 HISTORY — PX: PERIPHERAL VASCULAR BALLOON ANGIOPLASTY: CATH118281

## 2019-01-16 LAB — POCT I-STAT, CHEM 8
BUN: 33 mg/dL — ABNORMAL HIGH (ref 8–23)
Calcium, Ion: 1.21 mmol/L (ref 1.15–1.40)
Chloride: 98 mmol/L (ref 98–111)
Creatinine, Ser: 6.4 mg/dL — ABNORMAL HIGH (ref 0.61–1.24)
Glucose, Bld: 160 mg/dL — ABNORMAL HIGH (ref 70–99)
HCT: 32 % — ABNORMAL LOW (ref 39.0–52.0)
Hemoglobin: 10.9 g/dL — ABNORMAL LOW (ref 13.0–17.0)
Potassium: 3.8 mmol/L (ref 3.5–5.1)
Sodium: 139 mmol/L (ref 135–145)
TCO2: 31 mmol/L (ref 22–32)

## 2019-01-16 SURGERY — A/V FISTULAGRAM
Anesthesia: LOCAL | Laterality: Left

## 2019-01-16 MED ORDER — ONDANSETRON HCL 4 MG/2ML IJ SOLN: 4.0000 mg | Freq: Four times a day (QID) | INTRAMUSCULAR | Status: DC | PRN

## 2019-01-16 MED ORDER — SODIUM CHLORIDE 0.9% FLUSH: 3.0000 mL | INTRAVENOUS | Status: DC | PRN

## 2019-01-16 MED ORDER — HEPARIN SODIUM (PORCINE) 1000 UNIT/ML IJ SOLN
INTRAMUSCULAR | Status: DC | PRN
  Administered 2019-01-16: 3000 [IU] via INTRAVENOUS

## 2019-01-16 MED ORDER — OXYCODONE HCL 5 MG PO TABS: 5.0000 mg | ORAL_TABLET | ORAL | Status: DC | PRN

## 2019-01-16 MED ORDER — HEPARIN (PORCINE) IN NACL 1000-0.9 UT/500ML-% IV SOLN
INTRAVENOUS | Status: DC | PRN
  Administered 2019-01-16: 500 mL

## 2019-01-16 MED ORDER — OXYCODONE-ACETAMINOPHEN 5-325 MG PO TABS: 1.0000 | ORAL_TABLET | Freq: Every day | ORAL | 0 refills | Status: AC

## 2019-01-16 MED ORDER — SODIUM CHLORIDE 0.9 % IV SOLN: 250.0000 mL | INTRAVENOUS | Status: DC | PRN

## 2019-01-16 MED ORDER — FENTANYL CITRATE (PF) 100 MCG/2ML IJ SOLN
INTRAMUSCULAR | Status: AC
  Filled 2019-01-16: qty 2

## 2019-01-16 MED ORDER — LIDOCAINE HCL (PF) 1 % IJ SOLN
INTRAMUSCULAR | Status: DC | PRN
  Administered 2019-01-16: 5 mL

## 2019-01-16 MED ORDER — HEPARIN (PORCINE) IN NACL 1000-0.9 UT/500ML-% IV SOLN
INTRAVENOUS | Status: AC
  Filled 2019-01-16: qty 500

## 2019-01-16 MED ORDER — MORPHINE SULFATE (PF) 10 MG/ML IV SOLN: 2.0000 mg | INTRAVENOUS | Status: DC | PRN

## 2019-01-16 MED ORDER — FENTANYL CITRATE (PF) 100 MCG/2ML IJ SOLN
INTRAMUSCULAR | Status: DC | PRN
  Administered 2019-01-16: 25 ug via INTRAVENOUS

## 2019-01-16 MED ORDER — SODIUM CHLORIDE 0.9% FLUSH: 3.0000 mL | Freq: Two times a day (BID) | INTRAVENOUS | Status: DC

## 2019-01-16 MED ORDER — ACETAMINOPHEN 325 MG PO TABS: 650.0000 mg | ORAL_TABLET | ORAL | Status: DC | PRN

## 2019-01-16 MED ORDER — LABETALOL HCL 5 MG/ML IV SOLN: 10.0000 mg | INTRAVENOUS | Status: DC | PRN

## 2019-01-16 MED ORDER — IODIXANOL 320 MG/ML IV SOLN
INTRAVENOUS | Status: DC | PRN
  Administered 2019-01-16: 65 mL via INTRAVENOUS

## 2019-01-16 MED ORDER — LIDOCAINE HCL (PF) 1 % IJ SOLN
INTRAMUSCULAR | Status: AC
  Filled 2019-01-16: qty 30

## 2019-01-16 MED ORDER — HYDRALAZINE HCL 20 MG/ML IJ SOLN: 5.0000 mg | INTRAMUSCULAR | Status: DC | PRN

## 2019-01-16 SURGICAL SUPPLY — 21 items
BAG SNAP BAND KOVER 36X36 (MISCELLANEOUS) ×2 IMPLANT
BALLN MUSTANG 10X60X75 (BALLOONS) ×2
BALLN MUSTANG 12X60X75 (BALLOONS) ×2
BALLN MUSTANG 8.0X40 135 (BALLOONS) ×2
BALLN MUSTANG 8.0X40 75 (BALLOONS) ×2
BALLOON MUSTANG 10X60X75 (BALLOONS) IMPLANT
BALLOON MUSTANG 12X60X75 (BALLOONS) IMPLANT
BALLOON MUSTANG 8.0X40 135 (BALLOONS) IMPLANT
BALLOON MUSTANG 8.0X40 75 (BALLOONS) IMPLANT
CATH ANGIO 5F BER2 65CM (CATHETERS) ×1 IMPLANT
COVER DOME SNAP 22 D (MISCELLANEOUS) ×2 IMPLANT
KIT ENCORE 26 ADVANTAGE (KITS) ×1 IMPLANT
KIT MICROPUNCTURE NIT STIFF (SHEATH) ×1 IMPLANT
PROTECTION STATION PRESSURIZED (MISCELLANEOUS) ×2
SHEATH PINNACLE R/O II 7F 4CM (SHEATH) ×1 IMPLANT
SHEATH PROBE COVER 6X72 (BAG) ×2 IMPLANT
STATION PROTECTION PRESSURIZED (MISCELLANEOUS) ×1 IMPLANT
STOPCOCK MORSE 400PSI 3WAY (MISCELLANEOUS) ×2 IMPLANT
TRAY PV CATH (CUSTOM PROCEDURE TRAY) ×2 IMPLANT
TUBING CIL FLEX 10 FLL-RA (TUBING) ×2 IMPLANT
WIRE BENTSON .035X145CM (WIRE) ×1 IMPLANT

## 2019-01-16 NOTE — Interval H&P Note (Signed)
History and Physical Interval Note:  01/16/2019 7:32 AM  [name]  has presented today for surgery, with the diagnosis of Fistula Complication.  The various methods of treatment have been discussed with the patient and family. After consideration of risks, benefits and other options for treatment, the patient has consented to  Procedure(s): A/V FISTULAGRAM (Left) as a surgical intervention.  The patient's history has been reviewed, patient examined, no change in status, stable for surgery.  I have reviewed the patient's chart and labs.  Questions were answered to the patient's satisfaction.     [name]

## 2019-01-16 NOTE — Telephone Encounter (Signed)
-----   Message from [name], MD sent at 01/16/2019  9:30 AM EDT ----- Procedure: Left arm central venogram and left arm fistulogram, angioplasty left distal cephalic vein and subclavian vein  Patient has had angioplasty of his left cephalic arch for venous hypertension left arm swelling symptoms.  We will give his arm a few weeks to see if the swelling improves.  If the swelling is not improved consideration will need to be given for most likely stenting his cephalic arch to decrease venous hypertension symptoms.  However, we will try to delay this as long as possible as this will decrease the durability of his fistula long-term.  [name], MD [facility] [place] Office: [phone] Pager: [phone]

## 2019-01-16 NOTE — Discharge Instructions (Signed)

## 2019-01-16 NOTE — Op Note (Signed)
Procedure: Left arm central venogram and left arm fistulogram, angioplasty left distal cephalic vein and subclavian vein  Preoperative diagnosis: Venous hypertension left arm  Postoperative diagnosis: Same  Anesthesia: Local with IV sedation  Operative findings: 70% narrowing of venous outflow distal cephalic vein junction with subclavian vein angioplastied to 12 mm with residual less than 20% stenosis  Operative details: After obtaining informed consent, the patient was taken the PV lab.  The patient placed supine position Angie table.  Left upper extremities prepped and draped in usual sterile fashion.  Local anesthesia was infiltrated over the AV fistula in the left arm and neck near the antecubital crease.  Micropuncture needle was used to cannulate the fistula using ultrasound guidance.  Image was obtained for the patient's medical record.  Fistula was patent.  Micropuncture sheath placed over the guidewire in the AV fistula.  Central venogram and AV fistulogram was then obtained through the micropuncture sheath.  Central veins are patent.  The left subclavian and innominate vein fills into the superior vena cava.  There is a 70% narrowing at the cephalic arch where the cephalic vein junction with the subclavian vein meets.  This has multiple segments extending over several centimeters.  The remainder of the AV fistula is patent.  Blood pressure cuff was inflated in the upper arm and contrast was reflux across the arterial anastomosis and this was also widely patent.  At this point it was decided to intervene on the cephalic arch stenosis.  Patient was given 3000 units of intravenous heparin.  Micropuncture sheath was exchanged over a Bentson wire for a 7 [place] short sheath.  I then sequentially dilated the cephalic arch with an 8 followed by a 10 and then finally a 12 x 60 angioplasty balloon.  These were all inflated to nominal pressure for 1 minute.  Patient experienced some mild pressure in the  chest during the procedure and this resolved after balloon deflation.  Completion angiogram showed residual stenosis of about 20% and some recoil.  There was still some filling of collateral vessels as well.  However, if I went higher than the 12 balloon I felt most likely the patient was going to need to have this area stented so we will see if his arm swelling symptoms improved with the current therapy.  At this point guidewires and sheath were removed and the hole closed with a single Monocryl stitch.  Hemostasis obtained with direct pressure.  Patient was taken to the holding area in stable condition.  Operative management: Patient has had angioplasty of his left cephalic arch for venous hypertension left arm swelling symptoms.  We will give his arm a few weeks to see if the swelling improves.  If the swelling is not improved consideration will need to be given for most likely stenting his cephalic arch to decrease venous hypertension symptoms.  However, we will try to delay this as long as possible as this will decrease the durability of his fistula long-term.  [name], MD [facility] [place] [facility] Office: [phone] Pager: [phone]

## 2019-01-17 DIAGNOSIS — D509 Iron deficiency anemia, unspecified: Secondary | ICD-10-CM | POA: Diagnosis not present

## 2019-01-17 DIAGNOSIS — Z992 Dependence on renal dialysis: Secondary | ICD-10-CM | POA: Diagnosis not present

## 2019-01-17 DIAGNOSIS — N2581 Secondary hyperparathyroidism of renal origin: Secondary | ICD-10-CM | POA: Diagnosis not present

## 2019-01-17 DIAGNOSIS — N186 End stage renal disease: Secondary | ICD-10-CM | POA: Diagnosis not present

## 2019-01-17 DIAGNOSIS — D631 Anemia in chronic kidney disease: Secondary | ICD-10-CM | POA: Diagnosis not present

## 2019-01-19 DIAGNOSIS — Z89511 Acquired absence of right leg below knee: Secondary | ICD-10-CM | POA: Diagnosis not present

## 2019-01-19 DIAGNOSIS — E1122 Type 2 diabetes mellitus with diabetic chronic kidney disease: Secondary | ICD-10-CM | POA: Diagnosis not present

## 2019-01-19 DIAGNOSIS — F419 Anxiety disorder, unspecified: Secondary | ICD-10-CM | POA: Diagnosis not present

## 2019-01-19 DIAGNOSIS — Z794 Long term (current) use of insulin: Secondary | ICD-10-CM | POA: Diagnosis not present

## 2019-01-19 DIAGNOSIS — M1990 Unspecified osteoarthritis, unspecified site: Secondary | ICD-10-CM | POA: Diagnosis not present

## 2019-01-19 DIAGNOSIS — F329 Major depressive disorder, single episode, unspecified: Secondary | ICD-10-CM | POA: Diagnosis not present

## 2019-01-19 DIAGNOSIS — Z87891 Personal history of nicotine dependence: Secondary | ICD-10-CM | POA: Diagnosis not present

## 2019-01-19 DIAGNOSIS — E1142 Type 2 diabetes mellitus with diabetic polyneuropathy: Secondary | ICD-10-CM | POA: Diagnosis not present

## 2019-01-19 DIAGNOSIS — K219 Gastro-esophageal reflux disease without esophagitis: Secondary | ICD-10-CM | POA: Diagnosis not present

## 2019-01-19 DIAGNOSIS — Z4781 Encounter for orthopedic aftercare following surgical amputation: Secondary | ICD-10-CM | POA: Diagnosis not present

## 2019-01-19 DIAGNOSIS — E1151 Type 2 diabetes mellitus with diabetic peripheral angiopathy without gangrene: Secondary | ICD-10-CM | POA: Diagnosis not present

## 2019-01-19 DIAGNOSIS — Z9181 History of falling: Secondary | ICD-10-CM | POA: Diagnosis not present

## 2019-01-19 DIAGNOSIS — N186 End stage renal disease: Secondary | ICD-10-CM | POA: Diagnosis not present

## 2019-01-19 DIAGNOSIS — E785 Hyperlipidemia, unspecified: Secondary | ICD-10-CM | POA: Diagnosis not present

## 2019-01-19 DIAGNOSIS — E11621 Type 2 diabetes mellitus with foot ulcer: Secondary | ICD-10-CM | POA: Diagnosis not present

## 2019-01-19 DIAGNOSIS — J449 Chronic obstructive pulmonary disease, unspecified: Secondary | ICD-10-CM | POA: Diagnosis not present

## 2019-01-19 DIAGNOSIS — L97428 Non-pressure chronic ulcer of left heel and midfoot with other specified severity: Secondary | ICD-10-CM | POA: Diagnosis not present

## 2019-01-19 DIAGNOSIS — Z992 Dependence on renal dialysis: Secondary | ICD-10-CM | POA: Diagnosis not present

## 2019-01-19 DIAGNOSIS — E039 Hypothyroidism, unspecified: Secondary | ICD-10-CM | POA: Diagnosis not present

## 2019-01-19 DIAGNOSIS — I120 Hypertensive chronic kidney disease with stage 5 chronic kidney disease or end stage renal disease: Secondary | ICD-10-CM | POA: Diagnosis not present

## 2019-01-20 DIAGNOSIS — N2581 Secondary hyperparathyroidism of renal origin: Secondary | ICD-10-CM | POA: Diagnosis not present

## 2019-01-20 DIAGNOSIS — D631 Anemia in chronic kidney disease: Secondary | ICD-10-CM | POA: Diagnosis not present

## 2019-01-20 DIAGNOSIS — Z992 Dependence on renal dialysis: Secondary | ICD-10-CM | POA: Diagnosis not present

## 2019-01-20 DIAGNOSIS — D509 Iron deficiency anemia, unspecified: Secondary | ICD-10-CM | POA: Diagnosis not present

## 2019-01-20 DIAGNOSIS — N186 End stage renal disease: Secondary | ICD-10-CM | POA: Diagnosis not present

## 2019-01-21 ENCOUNTER — Encounter (HOSPITAL_COMMUNITY): Payer: Self-pay | Admitting: Vascular Surgery

## 2019-01-21 DIAGNOSIS — E11621 Type 2 diabetes mellitus with foot ulcer: Secondary | ICD-10-CM | POA: Diagnosis not present

## 2019-01-21 DIAGNOSIS — I120 Hypertensive chronic kidney disease with stage 5 chronic kidney disease or end stage renal disease: Secondary | ICD-10-CM | POA: Diagnosis not present

## 2019-01-21 DIAGNOSIS — Z4781 Encounter for orthopedic aftercare following surgical amputation: Secondary | ICD-10-CM | POA: Diagnosis not present

## 2019-01-21 DIAGNOSIS — E1122 Type 2 diabetes mellitus with diabetic chronic kidney disease: Secondary | ICD-10-CM | POA: Diagnosis not present

## 2019-01-21 DIAGNOSIS — L97428 Non-pressure chronic ulcer of left heel and midfoot with other specified severity: Secondary | ICD-10-CM | POA: Diagnosis not present

## 2019-01-21 DIAGNOSIS — N186 End stage renal disease: Secondary | ICD-10-CM | POA: Diagnosis not present

## 2019-01-22 ENCOUNTER — Telehealth: Payer: Self-pay | Admitting: Vascular Surgery

## 2019-01-22 DIAGNOSIS — L97428 Non-pressure chronic ulcer of left heel and midfoot with other specified severity: Secondary | ICD-10-CM | POA: Diagnosis not present

## 2019-01-22 DIAGNOSIS — D509 Iron deficiency anemia, unspecified: Secondary | ICD-10-CM | POA: Diagnosis not present

## 2019-01-22 DIAGNOSIS — N2581 Secondary hyperparathyroidism of renal origin: Secondary | ICD-10-CM | POA: Diagnosis not present

## 2019-01-22 DIAGNOSIS — E11621 Type 2 diabetes mellitus with foot ulcer: Secondary | ICD-10-CM | POA: Diagnosis not present

## 2019-01-22 DIAGNOSIS — N186 End stage renal disease: Secondary | ICD-10-CM | POA: Diagnosis not present

## 2019-01-22 DIAGNOSIS — E1122 Type 2 diabetes mellitus with diabetic chronic kidney disease: Secondary | ICD-10-CM | POA: Diagnosis not present

## 2019-01-22 DIAGNOSIS — Z4781 Encounter for orthopedic aftercare following surgical amputation: Secondary | ICD-10-CM | POA: Diagnosis not present

## 2019-01-22 DIAGNOSIS — Z992 Dependence on renal dialysis: Secondary | ICD-10-CM | POA: Diagnosis not present

## 2019-01-22 DIAGNOSIS — D631 Anemia in chronic kidney disease: Secondary | ICD-10-CM | POA: Diagnosis not present

## 2019-01-22 DIAGNOSIS — I120 Hypertensive chronic kidney disease with stage 5 chronic kidney disease or end stage renal disease: Secondary | ICD-10-CM | POA: Diagnosis not present

## 2019-01-22 NOTE — Telephone Encounter (Signed)
Melissa Hairston from [facility] called.  The nurses saw patient today and his wound has dehisced.  The measurements are 4 x 0.5cm.  It has been cleaned but is draining blood.  What are the new treatment orders?  Per Dr. [name], the patient will either need to see [place] in the office tomorrow or go to the ED.  Melissa is aware and had verbalized understanding.    Thurston Hole., LPN

## 2019-01-23 ENCOUNTER — Ambulatory Visit (INDEPENDENT_AMBULATORY_CARE_PROVIDER_SITE_OTHER): Payer: Self-pay | Admitting: Physician Assistant

## 2019-01-23 ENCOUNTER — Other Ambulatory Visit: Payer: Self-pay

## 2019-01-23 ENCOUNTER — Telehealth: Payer: Self-pay | Admitting: *Deleted

## 2019-01-23 VITALS — BP 130/66 | HR 71 | Temp 97.3°F | Resp 18 | Ht 76.0 in | Wt 254.0 lb

## 2019-01-23 DIAGNOSIS — S88111A Complete traumatic amputation at level between knee and ankle, right lower leg, initial encounter: Secondary | ICD-10-CM

## 2019-01-23 DIAGNOSIS — I739 Peripheral vascular disease, unspecified: Secondary | ICD-10-CM

## 2019-01-23 MED ORDER — CEPHALEXIN 500 MG PO CAPS: 500.0000 mg | ORAL_CAPSULE | Freq: Two times a day (BID) | ORAL | 0 refills | Status: DC

## 2019-01-23 NOTE — Telephone Encounter (Signed)
Call to answering service requesting appointment due to "Right stump incision about to bust open" Patient has home health provider who suggested they call for appointment. Denies any fever or chills. Claims area is cleaned and covered. She state this is an area that has been slow to heal over the past 2 months. Appt. With PA today given.

## 2019-01-23 NOTE — Progress Notes (Signed)
    Postoperative Visit    History of Present Illness   [name] is a 68 y.o. male who presents for postoperative follow-up for: right below-the-knee amputation by Dr. [name].(Date: 12/01/18).  He was recently discharged from inpatient rehabilitation.  He now has a home health nurse helping him with wound care.  He returns office due to some dehiscence of the medial aspect of his below the knee amputation incision.  Pain is only local to the area.  He denies any purulence or other drainage.  He also denies any fevers, chills, nausea/vomiting.   For VQI Use Only   PRE-ADM LIVING: Home  AMB STATUS: Ambulatory with Assistance   Physical Examination   Vitals:   01/23/19 1210  BP: 130/66  Pulse: 71  Resp: 18  Temp: (!) 97.3 F (36.3 C)  SpO2: 99%    RLE: Medial aspect of right below the knee amputation incision with about 4 x 1 cm of dehiscence with necrotic fat in the wound bed and necrotic tissue at the skin edges.  Suture material also noted in wound bed.  No palpable areas of fluctuance; no purulence with manipulation of the area of dehiscence; lateral corner of incision also with necrotic skin acting as a bio dressing   Medical Decision Making   [name] is a 68 y.o. male who presents s/p right below-the-knee amputation.   Area of dehiscence and medial aspect of below the knee amputation incision with fat necrosis at wound bed and nonviable skin edges  Patient was offered return to the operating room for washout and debridement.  He however is frustrated having been hospitalized for several weeks and would like to try everything we can to avoid return to the hospital or the OR  Wound was debrided during the visit as much as the patient could tolerate  He will be prescribed a two-week course of Keflex and will follow up in office for wound recheck in 2 weeks  Patient is aware he is at risk for revision versus above-the-knee amputation  He is also aware to  return to office sooner if pain worsens, purulence is noted, general appearance of wound worsens, or if he develops systemic infection symptoms  Dagoberto Ligas PA-C [facility] [place] Office: [phone]  Clinic MD: Dr. Donzetta Matters

## 2019-01-24 DIAGNOSIS — N2581 Secondary hyperparathyroidism of renal origin: Secondary | ICD-10-CM | POA: Diagnosis not present

## 2019-01-24 DIAGNOSIS — Z992 Dependence on renal dialysis: Secondary | ICD-10-CM | POA: Diagnosis not present

## 2019-01-24 DIAGNOSIS — D509 Iron deficiency anemia, unspecified: Secondary | ICD-10-CM | POA: Diagnosis not present

## 2019-01-24 DIAGNOSIS — N186 End stage renal disease: Secondary | ICD-10-CM | POA: Diagnosis not present

## 2019-01-24 DIAGNOSIS — D631 Anemia in chronic kidney disease: Secondary | ICD-10-CM | POA: Diagnosis not present

## 2019-01-26 ENCOUNTER — Telehealth: Payer: Self-pay

## 2019-01-26 DIAGNOSIS — E11621 Type 2 diabetes mellitus with foot ulcer: Secondary | ICD-10-CM | POA: Diagnosis not present

## 2019-01-26 DIAGNOSIS — I120 Hypertensive chronic kidney disease with stage 5 chronic kidney disease or end stage renal disease: Secondary | ICD-10-CM | POA: Diagnosis not present

## 2019-01-26 DIAGNOSIS — L97428 Non-pressure chronic ulcer of left heel and midfoot with other specified severity: Secondary | ICD-10-CM | POA: Diagnosis not present

## 2019-01-26 DIAGNOSIS — N186 End stage renal disease: Secondary | ICD-10-CM | POA: Diagnosis not present

## 2019-01-26 DIAGNOSIS — Z4781 Encounter for orthopedic aftercare following surgical amputation: Secondary | ICD-10-CM | POA: Diagnosis not present

## 2019-01-26 DIAGNOSIS — E1122 Type 2 diabetes mellitus with diabetic chronic kidney disease: Secondary | ICD-10-CM | POA: Diagnosis not present

## 2019-01-26 NOTE — Telephone Encounter (Signed)
Pt said that he is having some pain in the area where he had his wound dressed. Wanted to get a script for pain meds.   According to his chart, he is receiving pain meds from another physician. Called and advised him to contact their office because he could not get narcotics from 2 providers.   York Cerise, CMA

## 2019-01-27 DIAGNOSIS — D509 Iron deficiency anemia, unspecified: Secondary | ICD-10-CM | POA: Diagnosis not present

## 2019-01-27 DIAGNOSIS — D631 Anemia in chronic kidney disease: Secondary | ICD-10-CM | POA: Diagnosis not present

## 2019-01-27 DIAGNOSIS — E11621 Type 2 diabetes mellitus with foot ulcer: Secondary | ICD-10-CM | POA: Diagnosis not present

## 2019-01-27 DIAGNOSIS — E1122 Type 2 diabetes mellitus with diabetic chronic kidney disease: Secondary | ICD-10-CM | POA: Diagnosis not present

## 2019-01-27 DIAGNOSIS — Z992 Dependence on renal dialysis: Secondary | ICD-10-CM | POA: Diagnosis not present

## 2019-01-27 DIAGNOSIS — I120 Hypertensive chronic kidney disease with stage 5 chronic kidney disease or end stage renal disease: Secondary | ICD-10-CM | POA: Diagnosis not present

## 2019-01-27 DIAGNOSIS — N186 End stage renal disease: Secondary | ICD-10-CM | POA: Diagnosis not present

## 2019-01-27 DIAGNOSIS — Z4781 Encounter for orthopedic aftercare following surgical amputation: Secondary | ICD-10-CM | POA: Diagnosis not present

## 2019-01-27 DIAGNOSIS — N2581 Secondary hyperparathyroidism of renal origin: Secondary | ICD-10-CM | POA: Diagnosis not present

## 2019-01-27 DIAGNOSIS — L97428 Non-pressure chronic ulcer of left heel and midfoot with other specified severity: Secondary | ICD-10-CM | POA: Diagnosis not present

## 2019-01-28 DIAGNOSIS — E11621 Type 2 diabetes mellitus with foot ulcer: Secondary | ICD-10-CM | POA: Diagnosis not present

## 2019-01-28 DIAGNOSIS — I120 Hypertensive chronic kidney disease with stage 5 chronic kidney disease or end stage renal disease: Secondary | ICD-10-CM | POA: Diagnosis not present

## 2019-01-28 DIAGNOSIS — L97428 Non-pressure chronic ulcer of left heel and midfoot with other specified severity: Secondary | ICD-10-CM | POA: Diagnosis not present

## 2019-01-28 DIAGNOSIS — Z4781 Encounter for orthopedic aftercare following surgical amputation: Secondary | ICD-10-CM | POA: Diagnosis not present

## 2019-01-28 DIAGNOSIS — E1122 Type 2 diabetes mellitus with diabetic chronic kidney disease: Secondary | ICD-10-CM | POA: Diagnosis not present

## 2019-01-28 DIAGNOSIS — N186 End stage renal disease: Secondary | ICD-10-CM | POA: Diagnosis not present

## 2019-01-29 DIAGNOSIS — D631 Anemia in chronic kidney disease: Secondary | ICD-10-CM | POA: Diagnosis not present

## 2019-01-29 DIAGNOSIS — Z992 Dependence on renal dialysis: Secondary | ICD-10-CM | POA: Diagnosis not present

## 2019-01-29 DIAGNOSIS — D509 Iron deficiency anemia, unspecified: Secondary | ICD-10-CM | POA: Diagnosis not present

## 2019-01-29 DIAGNOSIS — N186 End stage renal disease: Secondary | ICD-10-CM | POA: Diagnosis not present

## 2019-01-29 DIAGNOSIS — N2581 Secondary hyperparathyroidism of renal origin: Secondary | ICD-10-CM | POA: Diagnosis not present

## 2019-01-30 DIAGNOSIS — E1122 Type 2 diabetes mellitus with diabetic chronic kidney disease: Secondary | ICD-10-CM | POA: Diagnosis not present

## 2019-01-30 DIAGNOSIS — L97428 Non-pressure chronic ulcer of left heel and midfoot with other specified severity: Secondary | ICD-10-CM | POA: Diagnosis not present

## 2019-01-30 DIAGNOSIS — I120 Hypertensive chronic kidney disease with stage 5 chronic kidney disease or end stage renal disease: Secondary | ICD-10-CM | POA: Diagnosis not present

## 2019-01-30 DIAGNOSIS — Z4781 Encounter for orthopedic aftercare following surgical amputation: Secondary | ICD-10-CM | POA: Diagnosis not present

## 2019-01-30 DIAGNOSIS — E11621 Type 2 diabetes mellitus with foot ulcer: Secondary | ICD-10-CM | POA: Diagnosis not present

## 2019-01-30 DIAGNOSIS — N186 End stage renal disease: Secondary | ICD-10-CM | POA: Diagnosis not present

## 2019-01-31 DIAGNOSIS — Z992 Dependence on renal dialysis: Secondary | ICD-10-CM | POA: Diagnosis not present

## 2019-01-31 DIAGNOSIS — N2581 Secondary hyperparathyroidism of renal origin: Secondary | ICD-10-CM | POA: Diagnosis not present

## 2019-01-31 DIAGNOSIS — N186 End stage renal disease: Secondary | ICD-10-CM | POA: Diagnosis not present

## 2019-01-31 DIAGNOSIS — D631 Anemia in chronic kidney disease: Secondary | ICD-10-CM | POA: Diagnosis not present

## 2019-01-31 DIAGNOSIS — D509 Iron deficiency anemia, unspecified: Secondary | ICD-10-CM | POA: Diagnosis not present

## 2019-02-03 DIAGNOSIS — E119 Type 2 diabetes mellitus without complications: Secondary | ICD-10-CM | POA: Diagnosis not present

## 2019-02-03 DIAGNOSIS — D631 Anemia in chronic kidney disease: Secondary | ICD-10-CM | POA: Diagnosis not present

## 2019-02-03 DIAGNOSIS — Z794 Long term (current) use of insulin: Secondary | ICD-10-CM | POA: Diagnosis not present

## 2019-02-03 DIAGNOSIS — Z992 Dependence on renal dialysis: Secondary | ICD-10-CM | POA: Diagnosis not present

## 2019-02-03 DIAGNOSIS — N2581 Secondary hyperparathyroidism of renal origin: Secondary | ICD-10-CM | POA: Diagnosis not present

## 2019-02-03 DIAGNOSIS — N186 End stage renal disease: Secondary | ICD-10-CM | POA: Diagnosis not present

## 2019-02-03 DIAGNOSIS — D509 Iron deficiency anemia, unspecified: Secondary | ICD-10-CM | POA: Diagnosis not present

## 2019-02-04 DIAGNOSIS — Z4781 Encounter for orthopedic aftercare following surgical amputation: Secondary | ICD-10-CM | POA: Diagnosis not present

## 2019-02-04 DIAGNOSIS — N186 End stage renal disease: Secondary | ICD-10-CM | POA: Diagnosis not present

## 2019-02-04 DIAGNOSIS — I120 Hypertensive chronic kidney disease with stage 5 chronic kidney disease or end stage renal disease: Secondary | ICD-10-CM | POA: Diagnosis not present

## 2019-02-04 DIAGNOSIS — E11621 Type 2 diabetes mellitus with foot ulcer: Secondary | ICD-10-CM | POA: Diagnosis not present

## 2019-02-04 DIAGNOSIS — E1122 Type 2 diabetes mellitus with diabetic chronic kidney disease: Secondary | ICD-10-CM | POA: Diagnosis not present

## 2019-02-04 DIAGNOSIS — L97428 Non-pressure chronic ulcer of left heel and midfoot with other specified severity: Secondary | ICD-10-CM | POA: Diagnosis not present

## 2019-02-05 DIAGNOSIS — N186 End stage renal disease: Secondary | ICD-10-CM | POA: Diagnosis not present

## 2019-02-05 DIAGNOSIS — D509 Iron deficiency anemia, unspecified: Secondary | ICD-10-CM | POA: Diagnosis not present

## 2019-02-05 DIAGNOSIS — N2581 Secondary hyperparathyroidism of renal origin: Secondary | ICD-10-CM | POA: Diagnosis not present

## 2019-02-05 DIAGNOSIS — D631 Anemia in chronic kidney disease: Secondary | ICD-10-CM | POA: Diagnosis not present

## 2019-02-05 DIAGNOSIS — Z992 Dependence on renal dialysis: Secondary | ICD-10-CM | POA: Diagnosis not present

## 2019-02-06 ENCOUNTER — Encounter: Payer: Self-pay | Admitting: *Deleted

## 2019-02-06 ENCOUNTER — Encounter: Payer: Self-pay | Admitting: Family

## 2019-02-06 ENCOUNTER — Other Ambulatory Visit: Payer: Self-pay

## 2019-02-06 ENCOUNTER — Ambulatory Visit (INDEPENDENT_AMBULATORY_CARE_PROVIDER_SITE_OTHER): Payer: Self-pay | Admitting: Family

## 2019-02-06 ENCOUNTER — Other Ambulatory Visit (HOSPITAL_COMMUNITY)
Admission: RE | Admit: 2019-02-06 | Discharge: 2019-02-06 | Disposition: A | Discharge status: Home / Self Care | Payer: Medicare Other | Source: Ambulatory Visit | Attending: Surgery | Admitting: Surgery

## 2019-02-06 VITALS — BP 112/62 | HR 78 | Temp 97.9°F | Resp 12 | Ht 76.0 in | Wt 250.0 lb

## 2019-02-06 DIAGNOSIS — Z89511 Acquired absence of right leg below knee: Secondary | ICD-10-CM

## 2019-02-06 DIAGNOSIS — I120 Hypertensive chronic kidney disease with stage 5 chronic kidney disease or end stage renal disease: Secondary | ICD-10-CM | POA: Diagnosis not present

## 2019-02-06 DIAGNOSIS — E11621 Type 2 diabetes mellitus with foot ulcer: Secondary | ICD-10-CM | POA: Diagnosis not present

## 2019-02-06 DIAGNOSIS — F17200 Nicotine dependence, unspecified, uncomplicated: Secondary | ICD-10-CM

## 2019-02-06 DIAGNOSIS — Z4781 Encounter for orthopedic aftercare following surgical amputation: Secondary | ICD-10-CM | POA: Diagnosis not present

## 2019-02-06 DIAGNOSIS — Z992 Dependence on renal dialysis: Secondary | ICD-10-CM

## 2019-02-06 DIAGNOSIS — N186 End stage renal disease: Secondary | ICD-10-CM

## 2019-02-06 DIAGNOSIS — E1122 Type 2 diabetes mellitus with diabetic chronic kidney disease: Secondary | ICD-10-CM | POA: Diagnosis not present

## 2019-02-06 DIAGNOSIS — T8789 Other complications of amputation stump: Secondary | ICD-10-CM

## 2019-02-06 DIAGNOSIS — L97428 Non-pressure chronic ulcer of left heel and midfoot with other specified severity: Secondary | ICD-10-CM | POA: Diagnosis not present

## 2019-02-06 MED ORDER — OXYCODONE-ACETAMINOPHEN 5-325 MG PO TABS: 1.0000 | ORAL_TABLET | ORAL | 0 refills | Status: DC | PRN

## 2019-02-06 NOTE — Progress Notes (Addendum)
[facility] [place]   CC: Right BKA wound check, peripheral artery occlusive disease  History of Present Illness [name] is a 68 y.o. male  who is s/p right below-the-knee amputation by Dr. [name].(Date: 12/01/18).   He was discharged from inpatient rehabilitation. He has home health helping him with wound care.    He returned to the office on 01-23-19 due to some dehiscence of the medial aspect of his below the knee amputation incision.  Pain was only local to the area.  He denied any purulence or other drainage.  He also denied any fevers, chills, nausea/vomiting at that time. There was an area of dehiscence at the medial aspect of below the knee amputation incision with fat necrosis at wound bed and nonviable skin edges Patient was offered return to the operating room for washout and debridement.  He however was frustrated having been hospitalized for several weeks and would like to try everything we can to avoid return to the hospital or the OR Wound was debrided during that visit as much as the patient could tolerate He was prescribed a two-week course of Keflex and was to follow up in office for wound recheck in 2 weeks Patient was made aware he is at risk for revision versus above-the-knee amputation He was also made aware to return to office sooner if pain worsens, purulence is noted, general appearance of wound worsens, or if he develops systemic infection symptoms.  He returns today for right BKA wound check. He states that [facility] has been changing his dressing, wetting the dressing before removing it.   He dialyzes T-T-S via left upper arm AVF.  He denies fever or chills.  He reports severe pain in his right BKA and asked for prescription to help pain.    Diabetic: Yes Tobacco use:  smoker, admits to 6 cigarettes/day  Pt meds include: Statin :Yes Betablocker: Yes ASA: No Other anticoagulants/antiplatelets: Plavix  Past Medical History:  Diagnosis  Date  . Anemia   . Anxiety   . Arthritis   . Chronic kidney disease   . COPD (chronic obstructive pulmonary disease) ([place])    Pt denies  . Depression   . Diabetes mellitus without complication ([place])   . GERD (gastroesophageal reflux disease)   . H/O hiatal hernia   . Headache(784.0)    Hx: Migraines  . Hypertension   . Neuropathy   . Numbness of toes    toes and feet  . Pneumonia    Hx: of several times  . Renal insufficiency   . Shortness of breath dyspnea   . Type 2 diabetes mellitus ([place])     Social History Social History   Tobacco Use  . Smoking status: Former Smoker    Years: 10.00    Types: Cigars    Quit date: 03/13/2012    Years since quitting: 6.9  . Smokeless tobacco: Never Used  Substance Use Topics  . Alcohol use: No    Alcohol/week: 0.0 standard drinks  . Drug use: No    Family History Family History  Problem Relation Age of Onset  . Heart disease Father        Heart Disease before age 80    Past Surgical History:  Procedure Laterality Date  . A/V FISTULAGRAM Left 01/16/2019   Procedure: A/V FISTULAGRAM;  Surgeon: [name], MD;  Location: [place] [facility];  Service: Cardiovascular;  Laterality: Left;  . AMPUTATION Right 12/01/2018   Procedure: RIGHT AMPUTATION  BELOW KNEE;  Surgeon: [name], MD;  [facility] OR;  Service: Vascular;  Laterality: Right;  . AV FISTULA PLACEMENT  2012      left arm   . AV FISTULA PLACEMENT Left 12/18/2012   Procedure: ARTERIOVENOUS (AV) FISTULA CREATION;  Surgeon: Angelia Mould, MD;  Location: [place];  Service: Vascular;  Laterality: Left;  . COLONOSCOPY  10/26/2011   Procedure: COLONOSCOPY;  Surgeon: Rogene Houston, MD;  [facility];  Service: Endoscopy;  Laterality: N/A;  730  . EMBOLECTOMY Left 12/09/2012   Procedure: EMBOLECTOMY;  Surgeon: Serafina Mitchell, MD;  Location: [facility] CATH LAB;  Service: Cardiovascular;  Laterality: Left;  left arm venous embolization  .  ESOPHAGOGASTRODUODENOSCOPY (EGD) WITH ESOPHAGEAL DILATION N/A 04/23/2013   Procedure: ESOPHAGOGASTRODUODENOSCOPY (EGD) WITH ESOPHAGEAL DILATION;  Surgeon: Rogene Houston, MD;  [facility];  Service: Endoscopy;  Laterality: N/A;  200-moved to 930   . FISTULA SUPERFICIALIZATION Left 06/18/2013   Procedure: FISTULA SUPERFICIALIZATION & LIGATION BRANCH X 1;  Surgeon: Mal Misty, MD;  Location: [place];  Service: Vascular;  Laterality: Left;  . HEMATOMA EVACUATION Right 02/11/2017   Procedure: EVACUATION HEMATOMA RIGHT GROIN, Repair of Right Pseudo-anerysm.;  Surgeon: [name], MD;  [facility];  Service: Vascular;  Laterality: Right;  . INGUINAL HERNIA REPAIR     ,  times   2  . INSERTION OF DIALYSIS CATHETER Left 12/18/2012   Procedure: INSERTION OF DIALYSIS CATHETER;  Surgeon: Angelia Mould, MD;  Location: [place];  Service: Vascular;  Laterality: Left;  . KNEE ARTHROSCOPY  2011   Right Knee  . LOWER EXTREMITY ANGIOGRAPHY N/A 02/11/2017   Procedure: Lower Extremity Angiography;  Surgeon: Lorretta Harp, MD;  Location: [place] [facility] LAB;  Service: Cardiovascular;  Laterality: N/A;  . PERIPHERAL ATHRECTOMY  02/11/2017  . PERIPHERAL VASCULAR ATHERECTOMY Left 02/11/2017   Procedure: Peripheral Vascular Atherectomy;  Surgeon: Lorretta Harp, MD;  Location: [place] [facility];  Service: Cardiovascular;  Laterality: Left;  . PERIPHERAL VASCULAR BALLOON ANGIOPLASTY Left 01/16/2019   Procedure: PERIPHERAL VASCULAR BALLOON ANGIOPLASTY;  Surgeon: [name], MD;  Location: [place] [facility] LAB;  Service: Cardiovascular;  Laterality: Left;  central vein  . REVISON OF ARTERIOVENOUS FISTULA Left 9/76/7341   Procedure: PLICATION OF LEFT BRACHIOCEPHALIC ARTERIOVENOUS FISTULA;  Surgeon: Conrad [place], MD;  Location: [place];  Service: Vascular;  Laterality: Left;  . SHUNTOGRAM N/A 12/09/2012   Procedure: fistulogram;  Surgeon: Serafina Mitchell, MD;  Location: [facility] CATH LAB;  Service:  Cardiovascular;  Laterality: N/A;  . SHUNTOGRAM Left 06/03/2013   Procedure: Fistulogram;  Surgeon: Serafina Mitchell, MD;  Location[facility] LAB;  Service: Cardiovascular;  Laterality: Left;  . THROMBECTOMY W/ EMBOLECTOMY Left 12/11/2012   Procedure: THROMBECTOMY ARTERIOVENOUS FISTULA;  Surgeon: Serafina Mitchell, MD;  Location: [place];  Service: Vascular;  Laterality: Left;  . TONSILLECTOMY      No Known Allergies  Current Outpatient Medications  Medication Sig Dispense Refill  . ARTIFICIAL TEAR OP Place 1 drop into both eyes every 6 (six) hours as needed (dry eyes).    Marland Kitchen atorvastatin (LIPITOR) 80 MG tablet Take 1 tablet (80 mg total) by mouth at bedtime. 30 tablet 5  . calcium acetate (PHOSLO) 667 MG capsule Take 4 capsules (2,668 mg total) by mouth 3 (three) times daily with meals. 360 capsule 0  . cinacalcet (SENSIPAR) 30 MG tablet Take 1 tablet (30 mg  total) by mouth every evening. 60 tablet 0  . clopidogrel (PLAVIX) 75 MG tablet Take 1 tablet (75 mg total) by mouth daily. 30 tablet 0  . levothyroxine (SYNTHROID) 125 MCG tablet Take 1 tablet (125 mcg total) by mouth daily before breakfast. 30 tablet 0  . lisinopril (ZESTRIL) 10 MG tablet Take 10 mg by mouth daily.    . pregabalin (LYRICA) 150 MG capsule Take 1 capsule (150 mg total) by mouth 3 (three) times daily. 90 capsule 0  . risperiDONE (RISPERDAL) 0.25 MG tablet Take 1 tablet (0.25 mg total) by mouth daily. 30 tablet 0  . TOUJEO SOLOSTAR 300 UNIT/ML SOPN Inject 54 Units as directed daily.     No current facility-administered medications for this visit.     ROS: See HPI for pertinent positives and negatives.   Physical Examination  Vitals:   02/06/19 1035  BP: 112/62  Pulse: 78  Resp: 12  Temp: 97.9 F (36.6 C)  TempSrc: Temporal  SpO2: 99%  Weight: 250 lb (113.4 kg)  Height: 6\' 4"  (1.93 m)   Body mass index is 30.43 kg/m.  General: A&O x 3, WDWN, obese male. Gait: seated in w/c HENT: No gross abnormalities.   Eyes: Pupils are equal Pulmonary: Respirations are non labored Cardiac: regular rhythm and rate                    VASCULAR EXAM: Extremities without ischemic changes, without Gangrene; with open wounds at medial and lateral aspects right BKA, flap is erythematous. See photos below   Right BKA medial wound   Right BKA lateral wounds                                                                                                          LE Pulses Right Left       FEMORAL  not palpable, seated in w/c, obese  not palpable        POPLITEAL  not palpable   not palpable       POSTERIOR TIBIAL  BKA   not palpable        DORSALIS PEDIS      ANTERIOR TIBIAL BKA  not palpable    Abdomen: soft, NT, no palpable masses. Skin: no rashes, see Extremities Musculoskeletal: no muscle wasting or atrophy. See Extremities   Neurologic: A&O X 3; appropriate affect, Sensation is diminished in left footl; MOTOR FUNCTION:  moving all extremities equally, motor strength 5/5 throughout. Speech is fluent/normal. CN 2-12 intact. Psychiatric: Thought content is normal, mood seems frustrated.     ASSESSMENT: [name] is a 68 y.o. male who is s/p right below-the-knee amputation by Dr. [name].(Date: 12/01/18).    Right BKA lateral and medial wounds are not healing, flap of stump is erythematous. Pt states severe pain in stump.  His atherosclerotic risk factors include tobacco use, ESRD, DM, and COPD.    Tobacco use: Over 3 minutes was spent counseling patient re smoking cessation, and patient was given several free resources re smoking cessation.  I discussed  with Dr. Donzetta Matters pt pertinent HPI and physical exam results. See Plan.  Dr. Donzetta Matters spoke with pt, and spoke with family by phone.   PLAN:  Based on the patient's HPI and examination, and after discussing with Dr. Donzetta Matters pt will be scheduled for debridement of right BKA non healing wounds ASAP.   I discussed in depth with the patient the nature  of atherosclerosis, and emphasized the importance of maximal medical management including strict control of blood pressure, blood glucose, and lipid levels, obtaining regular exercise, and cessation of smoking.  The patient is aware that without maximal medical management the underlying atherosclerotic disease process will progress, limiting the benefit of any interventions.  The patient was given information about PAD including signs, symptoms, treatment, what symptoms should prompt the patient to seek immediate medical care, and risk reduction measures to take.  Clemon Chambers, RN, MSN, FNP-C [facility] [employer] Phone: [phone]  Clinic MD: Donzetta Matters  02/06/19 10:47 AM

## 2019-02-06 NOTE — Patient Instructions (Signed)
Peripheral Vascular Disease  Peripheral vascular disease (PVD) is a disease of the blood vessels that are not part of your heart and brain. A simple term for PVD is poor circulation. In most cases, PVD narrows the blood vessels that carry blood from your heart to the rest of your body. This can reduce the supply of blood to your arms, legs, and internal organs, like your stomach or kidneys. However, PVD most often affects a person's lower legs and feet. Without treatment, PVD tends to get worse. PVD can also lead to acute ischemic limb. This is when an arm or leg suddenly cannot get enough blood. This is a medical emergency. Follow these instructions at home: Lifestyle  Do not use any products that contain nicotine or tobacco, such as cigarettes and e-cigarettes. If you need help quitting, ask your doctor.  Lose weight if you are overweight. Or, stay at a healthy weight as told by your doctor.  Eat a diet that is low in fat and cholesterol. If you need help, ask your doctor.  Exercise regularly. Ask your doctor for activities that are right for you. General instructions  Take over-the-counter and prescription medicines only as told by your doctor.  Take good care of your feet: ? Wear comfortable shoes that fit well. ? Check your feet often for any cuts or sores.  Keep all follow-up visits as told by your doctor This is important. Contact a doctor if:  You have cramps in your legs when you walk.  You have leg pain when you are at rest.  You have coldness in a leg or foot.  Your skin changes.  You are unable to get or have an erection (erectile dysfunction).  You have cuts or sores on your feet that do not heal. Get help right away if:  Your arm or leg turns cold, numb, and blue.  Your arms or legs become red, warm, swollen, painful, or numb.  You have chest pain.  You have trouble breathing.  You suddenly have weakness in your face, arm, or leg.  You become very  confused or you cannot speak.  You suddenly have a very bad headache.  You suddenly cannot see. Summary  Peripheral vascular disease (PVD) is a disease of the blood vessels.  A simple term for PVD is poor circulation. Without treatment, PVD tends to get worse.  Treatment may include exercise, low fat and low cholesterol diet, and quitting smoking. This information is not intended to replace advice given to you by your health care provider. Make sure you discuss any questions you have with your health care provider. Document Released: 10/24/2009 Document Revised: 09/06/2016 Document Reviewed: 09/06/2016 Elsevier Interactive Patient Education  2019 Elsevier Inc.  

## 2019-02-06 NOTE — H&P (View-Only) (Signed)
[facility] [place]   CC: Right BKA wound check, peripheral artery occlusive disease  History of Present Illness [name] is a 68 y.o. male  who is s/p right below-the-knee amputation by Dr. [name].(Date: 12/01/18).   He was discharged from inpatient rehabilitation. He has home health helping him with wound care.    He returned to the office on 01-23-19 due to some dehiscence of the medial aspect of his below the knee amputation incision.  Pain was only local to the area.  He denied any purulence or other drainage.  He also denied any fevers, chills, nausea/vomiting at that time. There was an area of dehiscence at the medial aspect of below the knee amputation incision with fat necrosis at wound bed and nonviable skin edges Patient was offered return to the operating room for washout and debridement.  He however was frustrated having been hospitalized for several weeks and would like to try everything we can to avoid return to the hospital or the OR Wound was debrided during that visit as much as the patient could tolerate He was prescribed a two-week course of Keflex and was to follow up in office for wound recheck in 2 weeks Patient was made aware he is at risk for revision versus above-the-knee amputation He was also made aware to return to office sooner if pain worsens, purulence is noted, general appearance of wound worsens, or if he develops systemic infection symptoms.  He returns today for right BKA wound check. He states that [facility] has been changing his dressing, wetting the dressing before removing it.   He dialyzes T-T-S via left upper arm AVF.  He denies fever or chills.  He reports severe pain in his right BKA and asked for prescription to help pain.    Diabetic: Yes Tobacco use:  smoker, admits to 6 cigarettes/day  Pt meds include: Statin :Yes Betablocker: Yes ASA: No Other anticoagulants/antiplatelets: Plavix  Past Medical History:  Diagnosis  Date  . Anemia   . Anxiety   . Arthritis   . Chronic kidney disease   . COPD (chronic obstructive pulmonary disease) ([place])    Pt denies  . Depression   . Diabetes mellitus without complication ([place])   . GERD (gastroesophageal reflux disease)   . H/O hiatal hernia   . Headache(784.0)    Hx: Migraines  . Hypertension   . Neuropathy   . Numbness of toes    toes and feet  . Pneumonia    Hx: of several times  . Renal insufficiency   . Shortness of breath dyspnea   . Type 2 diabetes mellitus ([place])     Social History Social History   Tobacco Use  . Smoking status: Former Smoker    Years: 10.00    Types: Cigars    Quit date: 03/13/2012    Years since quitting: 6.9  . Smokeless tobacco: Never Used  Substance Use Topics  . Alcohol use: No    Alcohol/week: 0.0 standard drinks  . Drug use: No    Family History Family History  Problem Relation Age of Onset  . Heart disease Father        Heart Disease before age 1    Past Surgical History:  Procedure Laterality Date  . A/V FISTULAGRAM Left 01/16/2019   Procedure: A/V FISTULAGRAM;  Surgeon: [name], MD;  Location: [place] [facility];  Service: Cardiovascular;  Laterality: Left;  . AMPUTATION Right 12/01/2018   Procedure: RIGHT AMPUTATION  BELOW KNEE;  Surgeon: [name], MD;  Location: [facility] OR;  Service: Vascular;  Laterality: Right;  . AV FISTULA PLACEMENT  2012      left arm   . AV FISTULA PLACEMENT Left 12/18/2012   Procedure: ARTERIOVENOUS (AV) FISTULA CREATION;  Surgeon: Angelia Mould, MD;  Location: [place];  Service: Vascular;  Laterality: Left;  . COLONOSCOPY  10/26/2011   Procedure: COLONOSCOPY;  Surgeon: Rogene Houston, MD;  [facility];  Service: Endoscopy;  Laterality: N/A;  730  . EMBOLECTOMY Left 12/09/2012   Procedure: EMBOLECTOMY;  Surgeon: Serafina Mitchell, MD;  Location: [facility] CATH LAB;  Service: Cardiovascular;  Laterality: Left;  left arm venous embolization  .  ESOPHAGOGASTRODUODENOSCOPY (EGD) WITH ESOPHAGEAL DILATION N/A 04/23/2013   Procedure: ESOPHAGOGASTRODUODENOSCOPY (EGD) WITH ESOPHAGEAL DILATION;  Surgeon: Rogene Houston, MD;  [facility];  Service: Endoscopy;  Laterality: N/A;  200-moved to 930   . FISTULA SUPERFICIALIZATION Left 06/18/2013   Procedure: FISTULA SUPERFICIALIZATION & LIGATION BRANCH X 1;  Surgeon: Mal Misty, MD;  Location: [place];  Service: Vascular;  Laterality: Left;  . HEMATOMA EVACUATION Right 02/11/2017   Procedure: EVACUATION HEMATOMA RIGHT GROIN, Repair of Right Pseudo-anerysm.;  Surgeon: [name], MD;  [facility];  Service: Vascular;  Laterality: Right;  . INGUINAL HERNIA REPAIR     ,  times   2  . INSERTION OF DIALYSIS CATHETER Left 12/18/2012   Procedure: INSERTION OF DIALYSIS CATHETER;  Surgeon: Angelia Mould, MD;  Location: [place];  Service: Vascular;  Laterality: Left;  . KNEE ARTHROSCOPY  2011   Right Knee  . LOWER EXTREMITY ANGIOGRAPHY N/A 02/11/2017   Procedure: Lower Extremity Angiography;  Surgeon: Lorretta Harp, MD;  Location: [place] [facility] LAB;  Service: Cardiovascular;  Laterality: N/A;  . PERIPHERAL ATHRECTOMY  02/11/2017  . PERIPHERAL VASCULAR ATHERECTOMY Left 02/11/2017   Procedure: Peripheral Vascular Atherectomy;  Surgeon: Lorretta Harp, MD;  Location: [place] CV LAB;  Service: Cardiovascular;  Laterality: Left;  . PERIPHERAL VASCULAR BALLOON ANGIOPLASTY Left 01/16/2019   Procedure: PERIPHERAL VASCULAR BALLOON ANGIOPLASTY;  Surgeon: [name], MD;  Location: [place] CV LAB;  Service: Cardiovascular;  Laterality: Left;  central vein  . REVISON OF ARTERIOVENOUS FISTULA Left 6/64/4034   Procedure: PLICATION OF LEFT BRACHIOCEPHALIC ARTERIOVENOUS FISTULA;  Surgeon: Conrad [place], MD;  Location: [place];  Service: Vascular;  Laterality: Left;  . SHUNTOGRAM N/A 12/09/2012   Procedure: fistulogram;  Surgeon: Serafina Mitchell, MD;  Location: [facility] CATH LAB;  Service:  Cardiovascular;  Laterality: N/A;  . SHUNTOGRAM Left 06/03/2013   Procedure: Fistulogram;  Surgeon: Serafina Mitchell, MD;  Location: [facility] CATH LAB;  Service: Cardiovascular;  Laterality: Left;  . THROMBECTOMY W/ EMBOLECTOMY Left 12/11/2012   Procedure: THROMBECTOMY ARTERIOVENOUS FISTULA;  Surgeon: Serafina Mitchell, MD;  Location: [place];  Service: Vascular;  Laterality: Left;  . TONSILLECTOMY      No Known Allergies  Current Outpatient Medications  Medication Sig Dispense Refill  . ARTIFICIAL TEAR OP Place 1 drop into both eyes every 6 (six) hours as needed (dry eyes).    Marland Kitchen atorvastatin (LIPITOR) 80 MG tablet Take 1 tablet (80 mg total) by mouth at bedtime. 30 tablet 5  . calcium acetate (PHOSLO) 667 MG capsule Take 4 capsules (2,668 mg total) by mouth 3 (three) times daily with meals. 360 capsule 0  . cinacalcet (SENSIPAR) 30 MG tablet Take 1 tablet (30 mg  total) by mouth every evening. 60 tablet 0  . clopidogrel (PLAVIX) 75 MG tablet Take 1 tablet (75 mg total) by mouth daily. 30 tablet 0  . levothyroxine (SYNTHROID) 125 MCG tablet Take 1 tablet (125 mcg total) by mouth daily before breakfast. 30 tablet 0  . lisinopril (ZESTRIL) 10 MG tablet Take 10 mg by mouth daily.    . pregabalin (LYRICA) 150 MG capsule Take 1 capsule (150 mg total) by mouth 3 (three) times daily. 90 capsule 0  . risperiDONE (RISPERDAL) 0.25 MG tablet Take 1 tablet (0.25 mg total) by mouth daily. 30 tablet 0  . TOUJEO SOLOSTAR 300 UNIT/ML SOPN Inject 54 Units as directed daily.     No current facility-administered medications for this visit.     ROS: See HPI for pertinent positives and negatives.   Physical Examination  Vitals:   02/06/19 1035  BP: 112/62  Pulse: 78  Resp: 12  Temp: 97.9 F (36.6 C)  TempSrc: Temporal  SpO2: 99%  Weight: 250 lb (113.4 kg)  Height: 6\' 4"  (1.93 m)   Body mass index is 30.43 kg/m.  General: A&O x 3, WDWN, obese male. Gait: seated in w/c HENT: No gross abnormalities.   Eyes: Pupils are equal Pulmonary: Respirations are non labored Cardiac: regular rhythm and rate                    VASCULAR EXAM: Extremities without ischemic changes, without Gangrene; with open wounds at medial and lateral aspects right BKA, flap is erythematous. See photos below   Right BKA medial wound   Right BKA lateral wounds                                                                                                          LE Pulses Right Left       FEMORAL  not palpable, seated in w/c, obese  not palpable        POPLITEAL  not palpable   not palpable       POSTERIOR TIBIAL  BKA   not palpable        DORSALIS PEDIS      ANTERIOR TIBIAL BKA  not palpable    Abdomen: soft, NT, no palpable masses. Skin: no rashes, see Extremities Musculoskeletal: no muscle wasting or atrophy. See Extremities   Neurologic: A&O X 3; appropriate affect, Sensation is diminished in left footl; MOTOR FUNCTION:  moving all extremities equally, motor strength 5/5 throughout. Speech is fluent/normal. CN 2-12 intact. Psychiatric: Thought content is normal, mood seems frustrated.     ASSESSMENT: [name] is a 68 y.o. male who is s/p right below-the-knee amputation by Dr. [name].(Date: 12/01/18).    Right BKA lateral and medial wounds are not healing, flap of stump is erythematous. Pt states severe pain in stump.  His atherosclerotic risk factors include tobacco use, ESRD, DM, and COPD.    Tobacco use: Over 3 minutes was spent counseling patient re smoking cessation, and patient was given several free resources re smoking cessation.  I discussed  with Dr. Donzetta Matters pt pertinent HPI and physical exam results. See Plan.  Dr. Donzetta Matters spoke with pt, and spoke with family by phone.   PLAN:  Based on the patient's HPI and examination, and after discussing with Dr. Donzetta Matters pt will be scheduled for debridement of right BKA non healing wounds ASAP.   I discussed in depth with the patient the nature  of atherosclerosis, and emphasized the importance of maximal medical management including strict control of blood pressure, blood glucose, and lipid levels, obtaining regular exercise, and cessation of smoking.  The patient is aware that without maximal medical management the underlying atherosclerotic disease process will progress, limiting the benefit of any interventions.  The patient was given information about PAD including signs, symptoms, treatment, what symptoms should prompt the patient to seek immediate medical care, and risk reduction measures to take.  Clemon Chambers, RN, MSN, FNP-C [facility] Phone: [phone]  Clinic MD: Donzetta Matters  02/06/19 10:47 AM

## 2019-02-07 DIAGNOSIS — D509 Iron deficiency anemia, unspecified: Secondary | ICD-10-CM | POA: Diagnosis not present

## 2019-02-07 DIAGNOSIS — Z992 Dependence on renal dialysis: Secondary | ICD-10-CM | POA: Diagnosis not present

## 2019-02-07 DIAGNOSIS — N186 End stage renal disease: Secondary | ICD-10-CM | POA: Diagnosis not present

## 2019-02-07 DIAGNOSIS — N2581 Secondary hyperparathyroidism of renal origin: Secondary | ICD-10-CM | POA: Diagnosis not present

## 2019-02-07 DIAGNOSIS — D631 Anemia in chronic kidney disease: Secondary | ICD-10-CM | POA: Diagnosis not present

## 2019-02-08 ENCOUNTER — Emergency Department (HOSPITAL_COMMUNITY): Payer: Medicare Other

## 2019-02-08 ENCOUNTER — Emergency Department (HOSPITAL_COMMUNITY)
Admission: EM | Admit: 2019-02-08 | Discharge: 2019-02-08 | Disposition: A | Discharge status: Home / Self Care | Payer: Medicare Other | Attending: Emergency Medicine | Admitting: Emergency Medicine

## 2019-02-08 ENCOUNTER — Encounter (HOSPITAL_COMMUNITY): Payer: Self-pay

## 2019-02-08 ENCOUNTER — Other Ambulatory Visit: Payer: Self-pay

## 2019-02-08 DIAGNOSIS — Z5189 Encounter for other specified aftercare: Secondary | ICD-10-CM

## 2019-02-08 DIAGNOSIS — E1122 Type 2 diabetes mellitus with diabetic chronic kidney disease: Secondary | ICD-10-CM | POA: Diagnosis not present

## 2019-02-08 DIAGNOSIS — Z992 Dependence on renal dialysis: Secondary | ICD-10-CM | POA: Diagnosis not present

## 2019-02-08 DIAGNOSIS — Z89511 Acquired absence of right leg below knee: Secondary | ICD-10-CM | POA: Diagnosis not present

## 2019-02-08 DIAGNOSIS — Z4781 Encounter for orthopedic aftercare following surgical amputation: Secondary | ICD-10-CM | POA: Diagnosis not present

## 2019-02-08 DIAGNOSIS — I120 Hypertensive chronic kidney disease with stage 5 chronic kidney disease or end stage renal disease: Secondary | ICD-10-CM | POA: Insufficient documentation

## 2019-02-08 DIAGNOSIS — Z79899 Other long term (current) drug therapy: Secondary | ICD-10-CM | POA: Diagnosis not present

## 2019-02-08 DIAGNOSIS — M79604 Pain in right leg: Secondary | ICD-10-CM | POA: Insufficient documentation

## 2019-02-08 DIAGNOSIS — Z7984 Long term (current) use of oral hypoglycemic drugs: Secondary | ICD-10-CM | POA: Diagnosis not present

## 2019-02-08 DIAGNOSIS — J449 Chronic obstructive pulmonary disease, unspecified: Secondary | ICD-10-CM | POA: Insufficient documentation

## 2019-02-08 DIAGNOSIS — Z87891 Personal history of nicotine dependence: Secondary | ICD-10-CM | POA: Diagnosis not present

## 2019-02-08 DIAGNOSIS — N186 End stage renal disease: Secondary | ICD-10-CM | POA: Diagnosis not present

## 2019-02-08 DIAGNOSIS — Z4801 Encounter for change or removal of surgical wound dressing: Secondary | ICD-10-CM | POA: Diagnosis not present

## 2019-02-08 LAB — BASIC METABOLIC PANEL
Anion gap: 17 — ABNORMAL HIGH (ref 5–15)
BUN: 48 mg/dL — ABNORMAL HIGH (ref 8–23)
CO2: 27 mmol/L (ref 22–32)
Calcium: 10.4 mg/dL — ABNORMAL HIGH (ref 8.9–10.3)
Chloride: 91 mmol/L — ABNORMAL LOW (ref 98–111)
Creatinine, Ser: 7.71 mg/dL — ABNORMAL HIGH (ref 0.61–1.24)
GFR calc Af Amer: 8 mL/min — ABNORMAL LOW (ref >60)
GFR calc non Af Amer: 7 mL/min — ABNORMAL LOW (ref >60)
Glucose, Bld: 146 mg/dL — ABNORMAL HIGH (ref 70–99)
Potassium: 4.1 mmol/L (ref 3.5–5.1)
Sodium: 135 mmol/L (ref 135–145)

## 2019-02-08 LAB — CBC
HCT: 33.2 % — ABNORMAL LOW (ref 39.0–52.0)
Hemoglobin: 10.9 g/dL — ABNORMAL LOW (ref 13.0–17.0)
MCH: 31.7 pg (ref 26.0–34.0)
MCHC: 32.8 g/dL (ref 30.0–36.0)
MCV: 96.5 fL (ref 80.0–100.0)
Platelets: 210 10*3/uL (ref 150–400)
RBC: 3.44 MIL/uL — ABNORMAL LOW (ref 4.22–5.81)
RDW: 17.1 % — ABNORMAL HIGH (ref 11.5–15.5)
WBC: 6.3 10*3/uL (ref 4.0–10.5)
nRBC: 0 % (ref 0.0–0.2)

## 2019-02-08 LAB — CBG MONITORING, ED: Glucose-Capillary: 204 mg/dL — ABNORMAL HIGH (ref 70–99)

## 2019-02-08 NOTE — Discharge Instructions (Addendum)
Follow-up with vascular surgery as scheduled for July 1.  Call them for any new or worse symptoms.

## 2019-02-08 NOTE — ED Triage Notes (Signed)
Pt presents to ED with redness and pain to right leg. Pt with recent right below leg amputation on 12/19/18. Pt states he is suppose to go have leg debrided on 02/11/19. Pt denies fever.

## 2019-02-08 NOTE — ED Provider Notes (Signed)
[facility] EMERGENCY DEPARTMENT Provider Note   CSN: [id number] Arrival date & time: 02/08/19  1849     History   Chief Complaint Chief Complaint  Patient presents with  . Leg Pain    HPI [name] is a 68 y.o. male.     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  Patient had some open granulating wound there now for a while.  Patient's sister thought that there was concerns for infection patient states that the summer from what he can see looks about the same.  Patient is a dialysis patient is normally dialyzed Tuesday Thursdays and Saturdays.  Patient denies any fevers.     Past Medical History:  Diagnosis Date  . Anemia   . Anxiety   . Arthritis   . Chronic kidney disease   . COPD (chronic obstructive pulmonary disease) ([place])    Pt denies  . Depression   . Diabetes mellitus without complication ([place])   . GERD (gastroesophageal reflux disease)   . H/O hiatal hernia   . Headache(784.0)    Hx: Migraines  . Hypertension   . Neuropathy   . Numbness of toes    toes and feet  . Pneumonia    Hx: of several times  . Renal insufficiency   . Shortness of breath dyspnea   . Type 2 diabetes mellitus [facility])     Patient Active Problem List   Diagnosis Date Noted  . Post-operative pain   . Labile blood pressure   . Labile blood glucose   . Hx of anxiety disorder   . Hypoglycemia   . Diabetic peripheral neuropathy ([place])   . Neuropathic pain   . S/P unilateral BKA (below knee amputation), right ([place])   . Sleep disturbance   . Type 2 diabetes mellitus with peripheral neuropathy (HCC)   . Benign essential HTN   . Acute blood loss anemia   . Anemia of chronic disease   . Right below-knee amputee ([place]) 12/03/2018  . Necrotizing fasciitis of ankle and foot ([place])   . Unilateral complete BKA, right, initial encounter ([place])   . Postoperative pain   . Phantom limb pain  ([place])   . Drug induced constipation   . Poorly controlled type 2 diabetes mellitus with peripheral neuropathy ([place])   . Acute on chronic anemia   . Ischemia of extremity 12/01/2018  . PAD (peripheral artery disease) ([place]) 02/12/2017  . Critical lower limb ischemia 02/11/2017  . Acute respiratory failure with hypoxia ([place]) 08/28/2015  . Hypertensive urgency 08/28/2015  . Chronic anemia 08/28/2015  . ESRD on dialysis ([place]) 08/28/2015  . Diabetes mellitus with end stage renal disease ([place]) 08/28/2015  . PVD (peripheral vascular disease) ([place]) 07/15/2014  . Peripheral vascular disease, unspecified ([place]) 01/14/2014  . Pain in limb-left arm 04/09/2013  . Guaiac + stool 04/07/2013  . Anemia 04/07/2013  . Dysphagia, unspecified(787.20) 04/07/2013  . Numbness and tingling-left arm  02/04/2013  . Cold hands and feet-left arm 02/04/2013  . Other complications due to renal dialysis device, implant, and graft 06/06/2012  . End stage renal disease ([place]) 02/05/2012  . Injury to blood vessels, unspecified site 02/05/2012  . Pain in joint, shoulder region 09/14/2011    Past Surgical History:  Procedure Laterality Date  . A/V FISTULAGRAM Left 01/16/2019   Procedure: A/V FISTULAGRAM;  Surgeon: [name], MD;  Location: [place] [facility];  Service: Cardiovascular;  Laterality: Left;  . AMPUTATION Right 12/01/2018   Procedure: RIGHT AMPUTATION BELOW KNEE;  Surgeon: [name], MD;  Location: [place];  Service: Vascular;  Laterality: Right;  . AV FISTULA PLACEMENT  2012      left arm   . AV FISTULA PLACEMENT Left 12/18/2012   Procedure: ARTERIOVENOUS (AV) FISTULA CREATION;  Surgeon: Angelia Mould, MD;  Location: [place];  Service: Vascular;  Laterality: Left;  . COLONOSCOPY  10/26/2011   Procedure: COLONOSCOPY;  Surgeon: Rogene Houston, MD;  [facility];  Service: Endoscopy;  Laterality: N/A;  730  . EMBOLECTOMY Left 12/09/2012   Procedure: EMBOLECTOMY;  Surgeon: Serafina Mitchell, MD;  Location: [facility] CATH LAB;  Service: Cardiovascular;  Laterality: Left;  left arm venous embolization  . ESOPHAGOGASTRODUODENOSCOPY (EGD) WITH ESOPHAGEAL DILATION N/A 04/23/2013   Procedure: ESOPHAGOGASTRODUODENOSCOPY (EGD) WITH ESOPHAGEAL DILATION;  Surgeon: Rogene Houston, MD;  [facility];  Service: Endoscopy;  Laterality: N/A;  200-moved to 930   . FISTULA SUPERFICIALIZATION Left 06/18/2013   Procedure: FISTULA SUPERFICIALIZATION & LIGATION BRANCH X 1;  Surgeon: Mal Misty, MD;  Location: [place];  Service: Vascular;  Laterality: Left;  . HEMATOMA EVACUATION Right 02/11/2017   Procedure: EVACUATION HEMATOMA RIGHT GROIN, Repair of Right Pseudo-anerysm.;  Surgeon: [name], MD;  Location: [facility] OR;  Service: Vascular;  Laterality: Right;  . INGUINAL HERNIA REPAIR     ,  times   2  . INSERTION OF DIALYSIS CATHETER Left 12/18/2012   Procedure: INSERTION OF DIALYSIS CATHETER;  Surgeon: Angelia Mould, MD;  Location: [place];  Service: Vascular;  Laterality: Left;  . KNEE ARTHROSCOPY  2011   Right Knee  . LOWER EXTREMITY ANGIOGRAPHY N/A 02/11/2017   Procedure: Lower Extremity Angiography;  Surgeon: Lorretta Harp, MD;  Location: [place] [facility];  Service: Cardiovascular;  Laterality: N/A;  . PERIPHERAL ATHRECTOMY  02/11/2017  . PERIPHERAL VASCULAR ATHERECTOMY Left 02/11/2017   Procedure: Peripheral Vascular Atherectomy;  Surgeon: Lorretta Harp, MD;  Location: [place] [facility];  Service: Cardiovascular;  Laterality: Left;  . PERIPHERAL VASCULAR BALLOON ANGIOPLASTY Left 01/16/2019   Procedure: PERIPHERAL VASCULAR BALLOON ANGIOPLASTY;  Surgeon: [name], MD;  Location: [place] [facility];  Service: Cardiovascular;  Laterality: Left;  central vein  . REVISON OF ARTERIOVENOUS FISTULA Left 9/60/4540   Procedure: PLICATION OF LEFT BRACHIOCEPHALIC ARTERIOVENOUS FISTULA;  Surgeon: Conrad Benton Ridge, MD;  Location: [place];  Service: Vascular;  Laterality: Left;  .  SHUNTOGRAM N/A 12/09/2012   Procedure: fistulogram;  Surgeon: Serafina Mitchell, MD;  Location: [facility] CATH LAB;  Service: Cardiovascular;  Laterality: N/A;  . SHUNTOGRAM Left 06/03/2013   Procedure: Fistulogram;  Surgeon: Serafina Mitchell, MD;  Location: [facility] CATH LAB;  Service: Cardiovascular;  Laterality: Left;  . THROMBECTOMY W/ EMBOLECTOMY Left 12/11/2012   Procedure: THROMBECTOMY ARTERIOVENOUS FISTULA;  Surgeon: Serafina Mitchell, MD;  Location: [place];  Service: Vascular;  Laterality: Left;  . TONSILLECTOMY          Home Medications    Prior to Admission medications   Medication Sig Start Date End Date Taking? Authorizing Provider  ARTIFICIAL TEAR OP Place 1 drop into both eyes every 6 (six) hours as needed (dry eyes).   Yes [provider]  atorvastatin (LIPITOR) 80 MG tablet Take 1 tablet (80 mg total) by mouth at bedtime. 12/18/18  Yes Angiulli, Lavon Paganini, PA-C  calcium acetate [facility])  667 MG capsule Take 4 capsules (2,668 mg total) by mouth 3 (three) times daily with meals. 12/19/18  Yes Angiulli, Lavon Paganini, PA-C  cinacalcet (SENSIPAR) 30 MG tablet Take 1 tablet (30 mg total) by mouth every evening. 12/19/18  Yes Angiulli, Lavon Paganini, PA-C  clopidogrel (PLAVIX) 75 MG tablet Take 1 tablet (75 mg total) by mouth daily. 12/18/18  Yes Angiulli, Lavon Paganini, PA-C  levothyroxine (SYNTHROID) 125 MCG tablet Take 1 tablet (125 mcg total) by mouth daily before breakfast. 12/18/18  Yes Angiulli, Lavon Paganini, PA-C  lisinopril (ZESTRIL) 10 MG tablet Take 10 mg by mouth daily.   Yes [provider]  oxyCODONE-acetaminophen (PERCOCET/ROXICET) 5-325 MG tablet Take 1 tablet by mouth every 4 (four) hours as needed for severe pain. 02/06/19  Yes Waynetta Sandy, MD  pregabalin (LYRICA) 150 MG capsule Take 1 capsule (150 mg total) by mouth 3 (three) times daily. 12/18/18  Yes Angiulli, Lavon Paganini, PA-C  risperiDONE (RISPERDAL) 0.25 MG tablet Take 1 tablet (0.25 mg total) by mouth daily. 12/18/18  Yes Angiulli,  Lavon Paganini, PA-C  TOUJEO SOLOSTAR 300 UNIT/ML SOPN Inject 54 Units as directed daily. 01/12/19  Yes [provider]    Family History Family History  Problem Relation Age of Onset  . Heart disease Father        Heart Disease before age 75    Social History Social History   Tobacco Use  . Smoking status: Former Smoker    Years: 10.00    Types: Cigars    Quit date: 03/13/2012    Years since quitting: 6.9  . Smokeless tobacco: Never Used  Substance Use Topics  . Alcohol use: No    Alcohol/week: 0.0 standard drinks  . Drug use: No     Allergies   Patient has no known allergies.   Review of Systems Review of Systems  Constitutional: Negative for chills and fever.  HENT: Negative for rhinorrhea and sore throat.   Eyes: Negative for visual disturbance.  Respiratory: Negative for cough and shortness of breath.   Cardiovascular: Negative for chest pain and leg swelling.  Gastrointestinal: Negative for abdominal pain, diarrhea, nausea and vomiting.  Genitourinary: Negative for dysuria.  Musculoskeletal: Negative for back pain and neck pain.  Skin: Positive for wound. Negative for rash.  Neurological: Negative for dizziness, light-headedness and headaches.  Hematological: Does not bruise/bleed easily.  Psychiatric/Behavioral: Negative for confusion.     Physical Exam Updated Vital Signs BP 104/69 (BP Location: Right Arm)   Pulse 80   Temp 97.6 F (36.4 C) (Tympanic)   Resp 18   Ht 1.93 m (6\' 4" )   Wt 111.1 kg   SpO2 100%   BMI 29.82 kg/m   Physical Exam Vitals signs and nursing note reviewed.  Constitutional:      Appearance: He is well-developed.  HENT:     Head: Normocephalic and atraumatic.  Eyes:     Extraocular Movements: Extraocular movements intact.     Conjunctiva/sclera: Conjunctivae normal.     Pupils: Pupils are equal, round, and reactive to light.  Neck:     Musculoskeletal: Normal range of motion and neck supple.  Cardiovascular:      Rate and Rhythm: Normal rate and regular rhythm.     Heart sounds: No murmur.  Pulmonary:     Effort: Pulmonary effort is normal. No respiratory distress.     Breath sounds: Normal breath sounds.  Abdominal:     General: Bowel sounds are normal.  Palpations: Abdomen is soft.     Tenderness: There is no abdominal tenderness.  Musculoskeletal:        General: No tenderness.     Comments: AV fistula left upper arm.  The right below the knee amputation has some erythema some granulation tissue erythema is to the distal part but there is good cap refill.  No red streaking moving up.  No tenderness to palpation.  Skin:    General: Skin is warm and dry.     Capillary Refill: Capillary refill takes less than 2 seconds.  Neurological:     General: No focal deficit present.     Mental Status: He is alert and oriented to person, place, and time.      ED Treatments / Results  Labs (all labs ordered are listed, but only abnormal results are displayed) Labs Reviewed  BASIC METABOLIC PANEL - Abnormal; Notable for the following components:      Result Value   Chloride 91 (*)    Glucose, Bld 146 (*)    BUN 48 (*)    Creatinine, Ser 7.71 (*)    Calcium 10.4 (*)    GFR calc non Af Amer 7 (*)    GFR calc Af Amer 8 (*)    Anion gap 17 (*)    All other components within normal limits  CBC - Abnormal; Notable for the following components:   RBC 3.44 (*)    Hemoglobin 10.9 (*)    HCT 33.2 (*)    RDW 17.1 (*)    All other components within normal limits  CBG MONITORING, ED - Abnormal; Notable for the following components:   Glucose-Capillary 204 (*)    All other components within normal limits    EKG    Radiology Dg Tibia/fibula Right  Result Date: 02/08/2019 CLINICAL DATA:  Post BKA on 12/19/2018, redness and pain at stump question infection EXAM: RIGHT TIBIA AND FIBULA - 2 VIEW COMPARISON:  Preoperative RIGHT knee radiographs 11/27/2018 FINDINGS: Post BKA. Soft tissue swelling of  stump. Lucent focus at the medial aspect of the stump distally approximately 3.0 x 1.5 cm question open wound, ulcer, or infection track. No fracture, dislocation or bone destruction. No additional soft tissue gas. IMPRESSION: Post BKA without acute osseous abnormalities. Soft tissue swelling at the stump of the RIGHT leg with a gas collection at the inferomedial aspect, question ulcer, open wound, or infection tract. Electronically Signed   By: Lavonia Dana M.D.   On: 02/08/2019 20:12    Procedures Procedures (including critical care time)  Medications Ordered in ED Medications - No data to display   Initial Impression / Assessment and Plan / ED Course  I have reviewed the triage vital signs and the nursing notes.  Pertinent labs & imaging results that were available during my care of the patient were reviewed by me and considered in my medical decision making (see chart for details).        Clinically without any distinct evidence of infection no tenderness married.  May be a little bit of erythema to the very distal part of the stump.  But good cap refill.  X-ray shows a little bit of gas but that is actually where the wound is open and there is granulation tissue.  Do not feel that patient requires admission to the vascular service or hospitalist service at this point in time.  Patient has follow-up and plan for wound debridement on July 1.  Patient will return for any  new or worse symptoms.  Patient stable for discharge home at this time.  There is no leukocytosis.  Final Clinical Impressions(s) / ED Diagnoses   Final diagnoses:  Visit for wound check    ED Discharge Orders    None       Fredia Sorrow, MD 02/08/19 2339

## 2019-02-08 NOTE — ED Notes (Signed)
Triage CBG 204

## 2019-02-09 DIAGNOSIS — N2581 Secondary hyperparathyroidism of renal origin: Secondary | ICD-10-CM | POA: Diagnosis not present

## 2019-02-09 DIAGNOSIS — Z992 Dependence on renal dialysis: Secondary | ICD-10-CM | POA: Diagnosis not present

## 2019-02-09 DIAGNOSIS — L97428 Non-pressure chronic ulcer of left heel and midfoot with other specified severity: Secondary | ICD-10-CM | POA: Diagnosis not present

## 2019-02-09 DIAGNOSIS — E1122 Type 2 diabetes mellitus with diabetic chronic kidney disease: Secondary | ICD-10-CM | POA: Diagnosis not present

## 2019-02-09 DIAGNOSIS — Z4781 Encounter for orthopedic aftercare following surgical amputation: Secondary | ICD-10-CM | POA: Diagnosis not present

## 2019-02-09 DIAGNOSIS — I120 Hypertensive chronic kidney disease with stage 5 chronic kidney disease or end stage renal disease: Secondary | ICD-10-CM | POA: Diagnosis not present

## 2019-02-09 DIAGNOSIS — D631 Anemia in chronic kidney disease: Secondary | ICD-10-CM | POA: Diagnosis not present

## 2019-02-09 DIAGNOSIS — E11621 Type 2 diabetes mellitus with foot ulcer: Secondary | ICD-10-CM | POA: Diagnosis not present

## 2019-02-09 DIAGNOSIS — D509 Iron deficiency anemia, unspecified: Secondary | ICD-10-CM | POA: Diagnosis not present

## 2019-02-09 DIAGNOSIS — N186 End stage renal disease: Secondary | ICD-10-CM | POA: Diagnosis not present

## 2019-02-10 ENCOUNTER — Other Ambulatory Visit (HOSPITAL_COMMUNITY)
Admission: RE | Admit: 2019-02-10 | Discharge: 2019-02-10 | Disposition: A | Discharge status: Home / Self Care | Payer: Medicare Other | Source: Ambulatory Visit | Attending: Surgery | Admitting: Surgery

## 2019-02-10 ENCOUNTER — Other Ambulatory Visit: Payer: Self-pay

## 2019-02-10 ENCOUNTER — Encounter (HOSPITAL_COMMUNITY): Payer: Self-pay | Admitting: *Deleted

## 2019-02-10 DIAGNOSIS — N2581 Secondary hyperparathyroidism of renal origin: Secondary | ICD-10-CM | POA: Diagnosis not present

## 2019-02-10 DIAGNOSIS — D509 Iron deficiency anemia, unspecified: Secondary | ICD-10-CM | POA: Diagnosis not present

## 2019-02-10 DIAGNOSIS — Z1159 Encounter for screening for other viral diseases: Secondary | ICD-10-CM | POA: Diagnosis not present

## 2019-02-10 DIAGNOSIS — Z992 Dependence on renal dialysis: Secondary | ICD-10-CM | POA: Diagnosis not present

## 2019-02-10 DIAGNOSIS — D631 Anemia in chronic kidney disease: Secondary | ICD-10-CM | POA: Diagnosis not present

## 2019-02-10 DIAGNOSIS — N186 End stage renal disease: Secondary | ICD-10-CM | POA: Diagnosis not present

## 2019-02-10 LAB — SARS CORONAVIRUS 2 BY RT PCR (HOSPITAL ORDER, PERFORMED IN ~~LOC~~ HOSPITAL LAB): SARS Coronavirus 2: NEGATIVE

## 2019-02-10 NOTE — Progress Notes (Signed)
Pt denies SOB, chest pain, and being under the care of a cardiologist. Pt denies having a cardiac cath. Pt made aware to stop taking vitamins, fish oil, Melatonin, and herbal medications. Do not take any NSAIDs ie: Ibuprofen, Advil, Naproxen (Aleve), Motrin, BC and Goody Powder. Pt stated that he had already taken 61 units of Toujeo. Pt made aware to check BG every 2 hours prior to arrival to hospital on DOS. Pt made aware to treat a BG < 70 with 4 cranberry juice, wait 15 minutes after intervention to recheck BG, if BG remains < 70, call Short Stay unit to speak with a nurse.  Pt denies that he and family members tested positive for COVID-19 ( pt reminded to quarantine).   Pt denies that he and family members experienced the following symptoms:  Cough yes/no: No Fever (>100.15F)  yes/no: No Runny nose yes/no: No Sore throat yes/no: No Difficulty breathing/shortness of breath  yes/no: No  Have you or a family member traveled in the last 14 days and where? yes/no: No   Pt reminded that hospital visitation restrictions are in effect and the importance of the restrictions.   Pt verbalized understanding of all pre-op instructions.

## 2019-02-10 NOTE — Progress Notes (Signed)
Dr. Trula Slade made aware that pt had not stopped Plavix; pt stated that he took a dose today. MD stated " okay, I'll do it. "

## 2019-02-11 ENCOUNTER — Telehealth: Payer: Self-pay | Admitting: Surgery

## 2019-02-11 ENCOUNTER — Ambulatory Visit (HOSPITAL_COMMUNITY): Payer: Medicare Other | Admitting: Anesthesiology

## 2019-02-11 ENCOUNTER — Encounter (HOSPITAL_COMMUNITY)
Admission: RE | Disposition: A | Discharge status: Home / Self Care | Payer: Self-pay | Source: Home / Self Care | Attending: Surgery

## 2019-02-11 ENCOUNTER — Encounter (HOSPITAL_COMMUNITY): Payer: Self-pay | Admitting: *Deleted

## 2019-02-11 ENCOUNTER — Encounter (HOSPITAL_COMMUNITY): Payer: Self-pay | Admitting: Emergency Medicine

## 2019-02-11 ENCOUNTER — Ambulatory Visit (HOSPITAL_COMMUNITY)
Admission: RE | Admit: 2019-02-11 | Discharge: 2019-02-11 | Disposition: A | Discharge status: Home / Self Care | Payer: Medicare Other | Attending: Surgery | Admitting: Surgery

## 2019-02-11 ENCOUNTER — Other Ambulatory Visit: Payer: Self-pay

## 2019-02-11 ENCOUNTER — Emergency Department (HOSPITAL_COMMUNITY)
Admission: EM | Admit: 2019-02-11 | Discharge: 2019-02-11 | Disposition: A | Discharge status: Home / Self Care | Payer: Medicare Other | Source: Home / Self Care | Attending: Emergency Medicine | Admitting: Emergency Medicine

## 2019-02-11 DIAGNOSIS — F17210 Nicotine dependence, cigarettes, uncomplicated: Secondary | ICD-10-CM | POA: Insufficient documentation

## 2019-02-11 DIAGNOSIS — E1142 Type 2 diabetes mellitus with diabetic polyneuropathy: Secondary | ICD-10-CM | POA: Insufficient documentation

## 2019-02-11 DIAGNOSIS — E039 Hypothyroidism, unspecified: Secondary | ICD-10-CM | POA: Diagnosis not present

## 2019-02-11 DIAGNOSIS — Z992 Dependence on renal dialysis: Secondary | ICD-10-CM | POA: Diagnosis not present

## 2019-02-11 DIAGNOSIS — D509 Iron deficiency anemia, unspecified: Secondary | ICD-10-CM | POA: Diagnosis not present

## 2019-02-11 DIAGNOSIS — N2581 Secondary hyperparathyroidism of renal origin: Secondary | ICD-10-CM | POA: Diagnosis not present

## 2019-02-11 DIAGNOSIS — I120 Hypertensive chronic kidney disease with stage 5 chronic kidney disease or end stage renal disease: Secondary | ICD-10-CM | POA: Insufficient documentation

## 2019-02-11 DIAGNOSIS — Y835 Amputation of limb(s) as the cause of abnormal reaction of the patient, or of later complication, without mention of misadventure at the time of the procedure: Secondary | ICD-10-CM | POA: Diagnosis not present

## 2019-02-11 DIAGNOSIS — E1122 Type 2 diabetes mellitus with diabetic chronic kidney disease: Secondary | ICD-10-CM | POA: Insufficient documentation

## 2019-02-11 DIAGNOSIS — D631 Anemia in chronic kidney disease: Secondary | ICD-10-CM | POA: Diagnosis not present

## 2019-02-11 DIAGNOSIS — N186 End stage renal disease: Secondary | ICD-10-CM | POA: Diagnosis not present

## 2019-02-11 DIAGNOSIS — M96830 Postprocedural hemorrhage and hematoma of a musculoskeletal structure following a musculoskeletal system procedure: Secondary | ICD-10-CM

## 2019-02-11 DIAGNOSIS — Z794 Long term (current) use of insulin: Secondary | ICD-10-CM | POA: Diagnosis not present

## 2019-02-11 DIAGNOSIS — Z89511 Acquired absence of right leg below knee: Secondary | ICD-10-CM | POA: Diagnosis not present

## 2019-02-11 DIAGNOSIS — Z79899 Other long term (current) drug therapy: Secondary | ICD-10-CM | POA: Insufficient documentation

## 2019-02-11 DIAGNOSIS — T8130XA Disruption of wound, unspecified, initial encounter: Secondary | ICD-10-CM | POA: Diagnosis not present

## 2019-02-11 DIAGNOSIS — Z7902 Long term (current) use of antithrombotics/antiplatelets: Secondary | ICD-10-CM | POA: Diagnosis not present

## 2019-02-11 DIAGNOSIS — J449 Chronic obstructive pulmonary disease, unspecified: Secondary | ICD-10-CM | POA: Insufficient documentation

## 2019-02-11 DIAGNOSIS — M1990 Unspecified osteoarthritis, unspecified site: Secondary | ICD-10-CM | POA: Diagnosis not present

## 2019-02-11 DIAGNOSIS — Z79890 Hormone replacement therapy (postmenopausal): Secondary | ICD-10-CM | POA: Insufficient documentation

## 2019-02-11 DIAGNOSIS — E1151 Type 2 diabetes mellitus with diabetic peripheral angiopathy without gangrene: Secondary | ICD-10-CM | POA: Insufficient documentation

## 2019-02-11 DIAGNOSIS — S81801A Unspecified open wound, right lower leg, initial encounter: Secondary | ICD-10-CM | POA: Diagnosis not present

## 2019-02-11 DIAGNOSIS — T8789 Other complications of amputation stump: Secondary | ICD-10-CM | POA: Insufficient documentation

## 2019-02-11 HISTORY — PX: WOUND DEBRIDEMENT: SHX247

## 2019-02-11 HISTORY — DX: Other complications of amputation stump: T87.89

## 2019-02-11 LAB — POCT I-STAT 4, (NA,K, GLUC, HGB,HCT)
Glucose, Bld: 169 mg/dL — ABNORMAL HIGH (ref 70–99)
HCT: 32 % — ABNORMAL LOW (ref 39.0–52.0)
Hemoglobin: 10.9 g/dL — ABNORMAL LOW (ref 13.0–17.0)
Potassium: 4.4 mmol/L (ref 3.5–5.1)
Sodium: 135 mmol/L (ref 135–145)

## 2019-02-11 LAB — GLUCOSE, CAPILLARY: Glucose-Capillary: 161 mg/dL — ABNORMAL HIGH (ref 70–99)

## 2019-02-11 SURGERY — DEBRIDEMENT, WOUND
Anesthesia: General | Site: Leg Lower | Laterality: Right

## 2019-02-11 MED ORDER — FENTANYL CITRATE (PF) 100 MCG/2ML IJ SOLN
INTRAMUSCULAR | Status: DC | PRN
  Administered 2019-02-11 (×3): 50 ug via INTRAVENOUS
  Administered 2019-02-11: 100 ug via INTRAVENOUS

## 2019-02-11 MED ORDER — PHENYLEPHRINE 40 MCG/ML (10ML) SYRINGE FOR IV PUSH (FOR BLOOD PRESSURE SUPPORT)
PREFILLED_SYRINGE | INTRAVENOUS | Status: AC
  Filled 2019-02-11: qty 10

## 2019-02-11 MED ORDER — 0.9 % SODIUM CHLORIDE (POUR BTL) OPTIME
TOPICAL | Status: DC | PRN
  Administered 2019-02-11: 1000 mL

## 2019-02-11 MED ORDER — DEXAMETHASONE SODIUM PHOSPHATE 10 MG/ML IJ SOLN
INTRAMUSCULAR | Status: DC | PRN
  Administered 2019-02-11: 5 mg via INTRAVENOUS

## 2019-02-11 MED ORDER — CEFAZOLIN SODIUM-DEXTROSE 2-4 GM/100ML-% IV SOLN
2.0000 g | INTRAVENOUS | Status: AC
  Administered 2019-02-11: 2 g via INTRAVENOUS

## 2019-02-11 MED ORDER — DEXAMETHASONE SODIUM PHOSPHATE 10 MG/ML IJ SOLN
INTRAMUSCULAR | Status: AC
  Filled 2019-02-11: qty 1

## 2019-02-11 MED ORDER — LIDOCAINE 2% (20 MG/ML) 5 ML SYRINGE
INTRAMUSCULAR | Status: DC | PRN
  Administered 2019-02-11: 60 mg via INTRAVENOUS

## 2019-02-11 MED ORDER — MIDAZOLAM HCL 5 MG/5ML IJ SOLN
INTRAMUSCULAR | Status: DC | PRN
  Administered 2019-02-11: 2 mg via INTRAVENOUS

## 2019-02-11 MED ORDER — OXYCODONE-ACETAMINOPHEN 5-325 MG PO TABS
ORAL_TABLET | ORAL | Status: AC
  Filled 2019-02-11: qty 1

## 2019-02-11 MED ORDER — OXYCODONE-ACETAMINOPHEN 5-325 MG PO TABS
1.0000 | ORAL_TABLET | Freq: Once | ORAL | Status: AC
  Administered 2019-02-11: 1 via ORAL

## 2019-02-11 MED ORDER — SODIUM CHLORIDE 0.9 % IV SOLN
INTRAVENOUS | Status: DC
  Administered 2019-02-11 (×2): via INTRAVENOUS

## 2019-02-11 MED ORDER — PROPOFOL 10 MG/ML IV BOLUS
INTRAVENOUS | Status: AC
  Filled 2019-02-11: qty 40

## 2019-02-11 MED ORDER — OXYCODONE-ACETAMINOPHEN 5-325 MG PO TABS: 1.0000 | ORAL_TABLET | ORAL | 0 refills | Status: DC | PRN

## 2019-02-11 MED ORDER — FENTANYL CITRATE (PF) 250 MCG/5ML IJ SOLN
INTRAMUSCULAR | Status: AC
  Filled 2019-02-11: qty 5

## 2019-02-11 MED ORDER — FENTANYL CITRATE (PF) 100 MCG/2ML IJ SOLN
25.0000 ug | INTRAMUSCULAR | Status: DC | PRN
  Administered 2019-02-11 (×2): 50 ug via INTRAVENOUS

## 2019-02-11 MED ORDER — PROPOFOL 10 MG/ML IV BOLUS
INTRAVENOUS | Status: DC | PRN
  Administered 2019-02-11: 250 mg via INTRAVENOUS

## 2019-02-11 MED ORDER — CHLORHEXIDINE GLUCONATE 4 % EX LIQD: 60.0000 mL | Freq: Once | CUTANEOUS | Status: DC

## 2019-02-11 MED ORDER — ONDANSETRON HCL 4 MG/2ML IJ SOLN
INTRAMUSCULAR | Status: AC
  Filled 2019-02-11: qty 2

## 2019-02-11 MED ORDER — MIDAZOLAM HCL 2 MG/2ML IJ SOLN
INTRAMUSCULAR | Status: AC
  Filled 2019-02-11: qty 2

## 2019-02-11 MED ORDER — CEFAZOLIN SODIUM-DEXTROSE 2-4 GM/100ML-% IV SOLN
INTRAVENOUS | Status: AC
  Filled 2019-02-11: qty 100

## 2019-02-11 MED ORDER — PHENYLEPHRINE 40 MCG/ML (10ML) SYRINGE FOR IV PUSH (FOR BLOOD PRESSURE SUPPORT)
PREFILLED_SYRINGE | INTRAVENOUS | Status: DC | PRN
  Administered 2019-02-11 (×3): 80 ug via INTRAVENOUS

## 2019-02-11 MED ORDER — ONDANSETRON HCL 4 MG/2ML IJ SOLN: 4.0000 mg | Freq: Once | INTRAMUSCULAR | Status: DC | PRN

## 2019-02-11 MED ORDER — FENTANYL CITRATE (PF) 100 MCG/2ML IJ SOLN
INTRAMUSCULAR | Status: AC
  Filled 2019-02-11: qty 2

## 2019-02-11 SURGICAL SUPPLY — 57 items
BANDAGE ACE 4X5 VEL STRL LF (GAUZE/BANDAGES/DRESSINGS) ×2 IMPLANT
BANDAGE ACE 6X5 VEL STRL LF (GAUZE/BANDAGES/DRESSINGS) ×2 IMPLANT
BANDAGE ESMARK 6X9 LF (GAUZE/BANDAGES/DRESSINGS) IMPLANT
BLADE SAW GIGLI 510 (BLADE) ×2 IMPLANT
BNDG COHESIVE 6X5 TAN STRL LF (GAUZE/BANDAGES/DRESSINGS) ×2 IMPLANT
BNDG ESMARK 6X9 LF (GAUZE/BANDAGES/DRESSINGS)
BNDG GAUZE ELAST 4 BULKY (GAUZE/BANDAGES/DRESSINGS) IMPLANT
CANISTER SUCT 3000ML PPV (MISCELLANEOUS) ×2 IMPLANT
CLIP VESOCCLUDE MED 6/CT (CLIP) IMPLANT
COVER SURGICAL LIGHT HANDLE (MISCELLANEOUS) ×2 IMPLANT
COVER WAND RF STERILE (DRAPES) ×2 IMPLANT
CUFF TOURN SGL QUICK 34 (TOURNIQUET CUFF)
CUFF TOURNIQUET SINGLE 44IN (TOURNIQUET CUFF) IMPLANT
CUFF TRNQT CYL 34X4.125X (TOURNIQUET CUFF) IMPLANT
DRAIN CHANNEL 19F RND (DRAIN) IMPLANT
DRAPE HALF SHEET 40X57 (DRAPES) ×2 IMPLANT
DRAPE INCISE IOBAN 66X45 STRL (DRAPES) IMPLANT
DRAPE ORTHO SPLIT 77X108 STRL (DRAPES) ×2
DRAPE SURG ORHT 6 SPLT 77X108 (DRAPES) ×2 IMPLANT
DRESSING PREVENA PLUS CUSTOM (GAUZE/BANDAGES/DRESSINGS) ×1 IMPLANT
DRSG ADAPTIC 3X8 NADH LF (GAUZE/BANDAGES/DRESSINGS) ×2 IMPLANT
DRSG PREVENA PLUS CUSTOM (GAUZE/BANDAGES/DRESSINGS) ×2
ELECT REM PT RETURN 9FT ADLT (ELECTROSURGICAL) ×2
ELECTRODE REM PT RTRN 9FT ADLT (ELECTROSURGICAL) ×1 IMPLANT
EVACUATOR SILICONE 100CC (DRAIN) IMPLANT
GAUZE 4X4 16PLY RFD (DISPOSABLE) ×2 IMPLANT
GAUZE SPONGE 4X4 12PLY STRL (GAUZE/BANDAGES/DRESSINGS) ×2 IMPLANT
GLOVE BIOGEL PI IND STRL 7.5 (GLOVE) ×1 IMPLANT
GLOVE BIOGEL PI INDICATOR 7.5 (GLOVE) ×1
GLOVE SURG SS PI 7.5 STRL IVOR (GLOVE) ×2 IMPLANT
GOWN STRL REUS W/ TWL LRG LVL3 (GOWN DISPOSABLE) ×2 IMPLANT
GOWN STRL REUS W/ TWL XL LVL3 (GOWN DISPOSABLE) ×1 IMPLANT
GOWN STRL REUS W/TWL LRG LVL3 (GOWN DISPOSABLE) ×2
GOWN STRL REUS W/TWL XL LVL3 (GOWN DISPOSABLE) ×1
KIT BASIN OR (CUSTOM PROCEDURE TRAY) ×2 IMPLANT
KIT PREVENA INCISION MGT 13 (CANNISTER) ×2 IMPLANT
KIT TURNOVER KIT B (KITS) ×2 IMPLANT
NS IRRIG 1000ML POUR BTL (IV SOLUTION) ×2 IMPLANT
PACK GENERAL/GYN (CUSTOM PROCEDURE TRAY) ×2 IMPLANT
PAD ARMBOARD 7.5X6 YLW CONV (MISCELLANEOUS) ×4 IMPLANT
PREVENA RESTOR ARTHOFORM 46X30 (CANNISTER) IMPLANT
STAPLER VISISTAT 35W (STAPLE) ×2 IMPLANT
STOCKINETTE IMPERVIOUS LG (DRAPES) ×2 IMPLANT
SUT BONE WAX W31G (SUTURE) IMPLANT
SUT ETHILON 2 0 PSLX (SUTURE) ×4 IMPLANT
SUT ETHILON 3 0 PS 1 (SUTURE) IMPLANT
SUT SILK 0 TIES 10X30 (SUTURE) ×2 IMPLANT
SUT SILK 2 0 (SUTURE)
SUT SILK 2 0 SH CR/8 (SUTURE) ×2 IMPLANT
SUT SILK 2-0 18XBRD TIE 12 (SUTURE) IMPLANT
SUT SILK 3 0 (SUTURE)
SUT SILK 3-0 18XBRD TIE 12 (SUTURE) IMPLANT
SUT VIC AB 2-0 CT1 18 (SUTURE) ×4 IMPLANT
TAPE UMBILICAL COTTON 1/8X30 (MISCELLANEOUS) IMPLANT
TOWEL GREEN STERILE (TOWEL DISPOSABLE) ×4 IMPLANT
UNDERPAD 30X30 (UNDERPADS AND DIAPERS) ×2 IMPLANT
WATER STERILE IRR 1000ML POUR (IV SOLUTION) ×2 IMPLANT

## 2019-02-11 NOTE — Telephone Encounter (Addendum)
Mittie Bodo., RN with [facility] called.    Patient came home from the hospital with a wound VAC that "keeps beeping" and he needs someone to come out and empty it now.    I contacted Zenia Resides with [employer] and had placed an order for a new wound VAC with her earlier today.    Dawn says the hospital probably sent pt home with a Prevena wound vac, and if that is the case, the nurse can just throw it away and do a wet to dry dressing change until he gets a new VAC.   Dawn will contact Melissa today with this information.    Shama Monfils E., LPN.

## 2019-02-11 NOTE — Telephone Encounter (Signed)
After speaking to [place] Nurse for [facility], I called the patient as well.    His family says the Martin Majestic is no longer providing a proper seal and exudate is draining onto the floor.  The earliest someone is able to come out to see this patient is after 5pm.  The family is very concerned.     I asked if the patient would go back to the ED to have someone take a look at the wound Vac.  The Martin Majestic was supposed to last until he was able to get a wound VAC from [place] on Friday.    Pt verbalized understanding and agreed to go back to the ED.  Tommy, O.R. Rep for [employer] was informed that there is a possibility that a wound VAC will be ordered at that time.  His phone number is [phone].  Thurston Hole., LPN

## 2019-02-11 NOTE — Interval H&P Note (Signed)
History and Physical Interval Note:  02/11/2019 9:37 AM  [name]  has presented today for surgery, with the diagnosis of NON-HEALING WOUND RIGHTBELOW THE KNEE  STUMP.  The various methods of treatment have been discussed with the patient and family. After consideration of risks, benefits and other options for treatment, the patient has consented to  Procedure(s): DEBRIDEMENT WOUND RIGHT BELOW THE KNEE STUMP (Right) as a surgical intervention.  The patient's history has been reviewed, patient examined, no change in status, stable for surgery.  I have reviewed the patient's chart and labs.  Questions were answered to the patient's satisfaction.     Wells Korinne Greenstein  Discussed debridement of right BKA, and possible revision to a shorter BKA, depending on intraoperative findings,.   WElls Brabahm

## 2019-02-11 NOTE — Anesthesia Preprocedure Evaluation (Signed)
Anesthesia Evaluation  Patient identified by MRN, date of birth, ID band Patient awake    Reviewed: Allergy & Precautions, H&P , NPO status , Patient's Chart, lab work & pertinent test results  Airway Mallampati: II   Neck ROM: full    Dental   Pulmonary COPD, Current Smoker, former smoker,    breath sounds clear to auscultation       Cardiovascular hypertension, Pt. on medications + Peripheral Vascular Disease   Rhythm:regular Rate:Normal     Neuro/Psych  Headaches, PSYCHIATRIC DISORDERS Anxiety Depression    GI/Hepatic Neg liver ROS, hiatal hernia, GERD  ,  Endo/Other  diabetes, Type 2Hypothyroidism   Renal/GU ESRF and DialysisRenal disease     Musculoskeletal negative musculoskeletal ROS (+)   Abdominal (+) + obese,   Peds  Hematology  (+) Blood dyscrasia, anemia , HLD   Anesthesia Other Findings Right leg wound   Reproductive/Obstetrics                             Lab Results  Component Value Date   WBC 6.3 02/08/2019   HGB 10.9 (L) 02/11/2019   HCT 32.0 (L) 02/11/2019   MCV 96.5 02/08/2019   PLT 210 02/08/2019   Lab Results  Component Value Date   CREATININE 7.71 (H) 02/08/2019   BUN 48 (H) 02/08/2019   NA 135 02/11/2019   K 4.4 02/11/2019   CL 91 (L) 02/08/2019   CO2 27 02/08/2019    Anesthesia Physical  Anesthesia Plan  ASA: III  Anesthesia Plan: General   Post-op Pain Management:    Induction: Intravenous  PONV Risk Score and Plan: 2 and Ondansetron, Dexamethasone and Treatment may vary due to age or medical condition  Airway Management Planned: LMA  Additional Equipment:   Intra-op Plan:   Post-operative Plan: Extubation in OR  Informed Consent: I have reviewed the patients History and Physical, chart, labs and discussed the procedure including the risks, benefits and alternatives for the proposed anesthesia with the patient or authorized representative  who has indicated his/her understanding and acceptance.     Dental advisory given  Plan Discussed with: CRNA, Anesthesiologist and Surgeon  Anesthesia Plan Comments:         Anesthesia Quick Evaluation

## 2019-02-11 NOTE — Anesthesia Postprocedure Evaluation (Signed)
Anesthesia Post Note  Patient: [name]  Procedure(s) Performed: DEBRIDEMENT WOUND RIGHT BELOW THE KNEE STUMP (Right Leg Lower)     Patient location during evaluation: PACU Anesthesia Type: General Level of consciousness: awake and alert Pain management: pain level controlled Vital Signs Assessment: post-procedure vital signs reviewed and stable Respiratory status: spontaneous breathing, nonlabored ventilation, respiratory function stable and patient connected to nasal cannula oxygen Cardiovascular status: blood pressure returned to baseline and stable Postop Assessment: no apparent nausea or vomiting Anesthetic complications: no    Last Vitals:  Vitals:   02/11/19 1137 02/11/19 1145  BP: 121/68 122/65  Pulse: 69 70  Resp: 15   Temp:    SpO2: 96% 94%    Last Pain:  Vitals:   02/11/19 1137  TempSrc:   PainSc: 7                  Tiajuana Amass

## 2019-02-11 NOTE — Discharge Instructions (Signed)
You will receive a phone call from a home health agency to assist with vac dressing changes   Post Anesthesia Home Care Instructions  Activity: Get plenty of rest for the remainder of the day. A responsible individual must stay with you for 24 hours following the procedure.  For the next 24 hours, DO NOT: -Drive a car -Paediatric nurse -Drink alcoholic beverages -Take any medication unless instructed by your physician -Make any legal decisions or sign important papers.  Meals: Start with liquid foods such as gelatin or soup. Progress to regular foods as tolerated. Avoid greasy, spicy, heavy foods. If nausea and/or vomiting occur, drink only clear liquids until the nausea and/or vomiting subsides. Call your physician if vomiting continues.  Special Instructions/Symptoms: Your throat may feel dry or sore from the anesthesia or the breathing tube placed in your throat during surgery. If this causes discomfort, gargle with warm salt water. The discomfort should disappear within 24 hours.  If you had a scopolamine patch placed behind your ear for the management of post- operative nausea and/or vomiting:  1. The medication in the patch is effective for 72 hours, after which it should be removed.  Wrap patch in a tissue and discard in the trash. Wash hands thoroughly with soap and water. 2. You may remove the patch earlier than 72 hours if you experience unpleasant side effects which may include dry mouth, dizziness or visual disturbances. 3. Avoid touching the patch. Wash your hands with soap and water after contact with the patch.

## 2019-02-11 NOTE — Transfer of Care (Signed)
Immediate Anesthesia Transfer of Care Note  Patient: KEYDEN PAVLOV  Procedure(s) Performed: DEBRIDEMENT WOUND RIGHT BELOW THE KNEE STUMP (Right Leg Lower)  Patient Location: PACU  Anesthesia Type:General  Level of Consciousness: drowsy and patient cooperative  Airway & Oxygen Therapy: Patient Spontanous Breathing and Patient connected to nasal cannula oxygen  Post-op Assessment: Report given to RN, Post -op Vital signs reviewed and stable and Patient moving all extremities  Post vital signs: Reviewed and stable  Last Vitals:  Vitals Value Taken Time  BP    Temp    Pulse 73 02/11/19 1106  Resp 9 02/11/19 1106  SpO2 100 % 02/11/19 1106  Vitals shown include unvalidated device data.  Last Pain:  Vitals:   02/11/19 0859  TempSrc:   PainSc: 7       Patients Stated Pain Goal: 4 (08/16/02 5913)  Complications: No apparent anesthesia complications

## 2019-02-11 NOTE — ED Provider Notes (Signed)
[facility] EMERGENCY DEPARTMENT Provider Note   CSN: [id number] Arrival date & time: 02/11/19  1615     History   Chief Complaint Chief Complaint  Patient presents with  . Post-op Problem    HPI [name] is a 68 y.o. male who presents emergency department chief complaint of postop bleeding.  The patient is status post right BKA.  He had fat necrosis wound debridement performed by Dr. Trula Slade at [facility] today.  He was discharged with a wound VAC in place.  On his way home the alarm went off and he noticed that the entire 150 mL chamber was already filled with blood.  The wound VAC at the stump on his right leg was also filled with warm heavy blood.  Patient came to the ER for further management.  He denies shortness of breath lightheadedness or other signs or symptoms of acute hemorrhagic anemia.Marland Kitchen     HPI  Past Medical History:  Diagnosis Date  . Anemia   . Anxiety   . Arthritis   . Chronic kidney disease   . COPD (chronic obstructive pulmonary disease) ([place])    Pt denies  . Depression   . Diabetes mellitus without complication ([place])   . GERD (gastroesophageal reflux disease)   . H/O hiatal hernia   . Headache(784.0)    Hx: Migraines  . Hypertension   . Neuropathy   . Non-healing wound of amputation stump ([place])    right  . Numbness of toes    toes and feet  . Pneumonia    Hx: of several times  . Renal insufficiency   . Shortness of breath dyspnea   . Type 2 diabetes mellitus [facility])     Patient Active Problem List   Diagnosis Date Noted  . Post-operative pain   . Labile blood pressure   . Labile blood glucose   . Hx of anxiety disorder   . Hypoglycemia   . Diabetic peripheral neuropathy ([place])   . Neuropathic pain   . S/P unilateral BKA (below knee amputation), right ([place])   . Sleep disturbance   . Type 2 diabetes mellitus with peripheral neuropathy (HCC)   . Benign essential HTN   . Acute blood loss anemia   . Anemia of chronic disease   .  Right below-knee amputee ([place]) 12/03/2018  . Necrotizing fasciitis of ankle and foot ([place])   . Unilateral complete BKA, right, initial encounter ([place])   . Postoperative pain   . Phantom limb pain ([place])   . Drug induced constipation   . Poorly controlled type 2 diabetes mellitus with peripheral neuropathy ([place])   . Acute on chronic anemia   . Ischemia of extremity 12/01/2018  . PAD (peripheral artery disease) ([place]) 02/12/2017  . Critical lower limb ischemia 02/11/2017  . Acute respiratory failure with hypoxia ([place]) 08/28/2015  . Hypertensive urgency 08/28/2015  . Chronic anemia 08/28/2015  . ESRD on dialysis ([place]) 08/28/2015  . Diabetes mellitus with end stage renal disease ([place]) 08/28/2015  . PVD (peripheral vascular disease) ([place]) 07/15/2014  . Peripheral vascular disease, unspecified ([place]) 01/14/2014  . Pain in limb-left arm 04/09/2013  . Guaiac + stool 04/07/2013  . Anemia 04/07/2013  . Dysphagia, unspecified(787.20) 04/07/2013  . Numbness and tingling-left arm  02/04/2013  . Cold hands and feet-left arm 02/04/2013  . Other complications due to renal dialysis device, implant, and graft 06/06/2012  . End stage renal disease ([place]) 02/05/2012  . Injury to blood vessels, unspecified site 02/05/2012  .  Pain in joint, shoulder region 09/14/2011    Past Surgical History:  Procedure Laterality Date  . A/V FISTULAGRAM Left 01/16/2019   Procedure: A/V FISTULAGRAM;  Surgeon: [name], MD;  Location: [place] [facility];  Service: Cardiovascular;  Laterality: Left;  . AMPUTATION Right 12/01/2018   Procedure: RIGHT AMPUTATION BELOW KNEE;  Surgeon: [name], MD;  Location: [place];  Service: Vascular;  Laterality: Right;  . AV FISTULA PLACEMENT  2012      left arm   . AV FISTULA PLACEMENT Left 12/18/2012   Procedure: ARTERIOVENOUS (AV) FISTULA CREATION;  Surgeon: Angelia Mould, MD;  Location: [place];  Service: Vascular;  Laterality: Left;  . COLONOSCOPY  10/26/2011    Procedure: COLONOSCOPY;  Surgeon: Rogene Houston, MD;  [facility];  Service: Endoscopy;  Laterality: N/A;  730  . EMBOLECTOMY Left 12/09/2012   Procedure: EMBOLECTOMY;  Surgeon: Serafina Mitchell, MD;  [facility] CATH LAB;  Service: Cardiovascular;  Laterality: Left;  left arm venous embolization  . ESOPHAGOGASTRODUODENOSCOPY (EGD) WITH ESOPHAGEAL DILATION N/A 04/23/2013   Procedure: ESOPHAGOGASTRODUODENOSCOPY (EGD) WITH ESOPHAGEAL DILATION;  Surgeon: Rogene Houston, MD;  [facility];  Service: Endoscopy;  Laterality: N/A;  200-moved to 930   . FISTULA SUPERFICIALIZATION Left 06/18/2013   Procedure: FISTULA SUPERFICIALIZATION & LIGATION BRANCH X 1;  Surgeon: Mal Misty, MD;  Location: [place];  Service: Vascular;  Laterality: Left;  . HEMATOMA EVACUATION Right 02/11/2017   Procedure: EVACUATION HEMATOMA RIGHT GROIN, Repair of Right Pseudo-anerysm.;  Surgeon: [name], MD;  Location: [facility] OR;  Service: Vascular;  Laterality: Right;  . INGUINAL HERNIA REPAIR     ,  times   2  . INSERTION OF DIALYSIS CATHETER Left 12/18/2012   Procedure: INSERTION OF DIALYSIS CATHETER;  Surgeon: Angelia Mould, MD;  Location: [place];  Service: Vascular;  Laterality: Left;  . KNEE ARTHROSCOPY  2011   Right Knee  . LOWER EXTREMITY ANGIOGRAPHY N/A 02/11/2017   Procedure: Lower Extremity Angiography;  Surgeon: Lorretta Harp, MD;  Location: [place] [facility];  Service: Cardiovascular;  Laterality: N/A;  . PERIPHERAL ATHRECTOMY  02/11/2017  . PERIPHERAL VASCULAR ATHERECTOMY Left 02/11/2017   Procedure: Peripheral Vascular Atherectomy;  Surgeon: Lorretta Harp, MD;  Location: [place] [facility];  Service: Cardiovascular;  Laterality: Left;  . PERIPHERAL VASCULAR BALLOON ANGIOPLASTY Left 01/16/2019   Procedure: PERIPHERAL VASCULAR BALLOON ANGIOPLASTY;  Surgeon: [name], MD;  Location: [place] [facility];  Service: Cardiovascular;  Laterality: Left;  central vein  . REVISON OF  ARTERIOVENOUS FISTULA Left 10/11/6008   Procedure: PLICATION OF LEFT BRACHIOCEPHALIC ARTERIOVENOUS FISTULA;  Surgeon: Conrad St. Tammany, MD;  Location: [place];  Service: Vascular;  Laterality: Left;  . SHUNTOGRAM N/A 12/09/2012   Procedure: fistulogram;  Surgeon: Serafina Mitchell, MD;  Location: [facility] CATH LAB;  Service: Cardiovascular;  Laterality: N/A;  . SHUNTOGRAM Left 06/03/2013   Procedure: Fistulogram;  Surgeon: Serafina Mitchell, MD;  Location: [facility] CATH LAB;  Service: Cardiovascular;  Laterality: Left;  . THROMBECTOMY W/ EMBOLECTOMY Left 12/11/2012   Procedure: THROMBECTOMY ARTERIOVENOUS FISTULA;  Surgeon: Serafina Mitchell, MD;  Location: [place];  Service: Vascular;  Laterality: Left;  . TONSILLECTOMY          Home Medications    Prior to Admission medications   Medication Sig Start Date End Date Taking? Authorizing Provider  ARTIFICIAL TEAR OP Place 1 drop into both eyes every  6 (six) hours as needed (dry eyes).    [provider]  atorvastatin (LIPITOR) 80 MG tablet Take 1 tablet (80 mg total) by mouth at bedtime. 12/18/18   Angiulli, Lavon Paganini, PA-C  calcium acetate (PHOSLO) 667 MG capsule Take 4 capsules (2,668 mg total) by mouth 3 (three) times daily with meals. 12/19/18   Angiulli, Lavon Paganini, PA-C  cinacalcet (SENSIPAR) 30 MG tablet Take 1 tablet (30 mg total) by mouth every evening. 12/19/18   Angiulli, Lavon Paganini, PA-C  clopidogrel (PLAVIX) 75 MG tablet Take 1 tablet (75 mg total) by mouth daily. 12/18/18   Angiulli, Lavon Paganini, PA-C  levothyroxine (SYNTHROID) 125 MCG tablet Take 1 tablet (125 mcg total) by mouth daily before breakfast. 12/18/18   Angiulli, Lavon Paganini, PA-C  lisinopril (ZESTRIL) 10 MG tablet Take 10 mg by mouth daily.    [provider]  oxyCODONE-acetaminophen (PERCOCET/ROXICET) 5-325 MG tablet Take 1 tablet by mouth every 4 (four) hours as needed for severe pain. 02/11/19   Dagoberto Ligas, PA-C  pregabalin (LYRICA) 150 MG capsule Take 1 capsule (150 mg total) by mouth 3  (three) times daily. 12/18/18   Angiulli, Lavon Paganini, PA-C  risperiDONE (RISPERDAL) 0.25 MG tablet Take 1 tablet (0.25 mg total) by mouth daily. 12/18/18   Angiulli, Lavon Paganini, PA-C  TOUJEO SOLOSTAR 300 UNIT/ML SOPN Inject 54 Units as directed daily. 01/12/19   [provider]    Family History Family History  Problem Relation Age of Onset  . Heart disease Father        Heart Disease before age 16    Social History Social History   Tobacco Use  . Smoking status: Current Every Day Smoker    Years: 10.00    Types: Cigars    Last attempt to quit: 03/13/2012    Years since quitting: 6.9  . Smokeless tobacco: Never Used  . Tobacco comment: smokes 6-8 cigarettes daily  Substance Use Topics  . Alcohol use: No    Alcohol/week: 0.0 standard drinks  . Drug use: No     Allergies   Patient has no known allergies.   Review of Systems Review of Systems  Ten systems reviewed and are negative for acute change, except as noted in the HPI.   Physical Exam Updated Vital Signs BP 138/65 (BP Location: Right Arm)   Pulse 84   Temp 97.7 F (36.5 C) (Oral)   Resp 18   SpO2 97%   Physical Exam Vitals signs and nursing note reviewed.  Constitutional:      General: He is not in acute distress.    Appearance: He is well-developed. He is not diaphoretic.  HENT:     Head: Normocephalic and atraumatic.  Eyes:     General: No scleral icterus.    Conjunctiva/sclera: Conjunctivae normal.  Neck:     Musculoskeletal: Normal range of motion and neck supple.  Cardiovascular:     Rate and Rhythm: Normal rate and regular rhythm.     Heart sounds: Normal heart sounds.  Pulmonary:     Effort: Pulmonary effort is normal. No respiratory distress.     Breath sounds: Normal breath sounds.  Abdominal:     Palpations: Abdomen is soft.     Tenderness: There is no abdominal tenderness.  Musculoskeletal:     Comments: Right leg with wound VAC in place.  Wound VAC filled with warm blood heavy.  The  vacuum chamber is filled to its entirety with blood.  Skin:  General: Skin is warm and dry.  Neurological:     Mental Status: He is alert.  Psychiatric:        Behavior: Behavior normal.      ED Treatments / Results  Labs (all labs ordered are listed, but only abnormal results are displayed) Labs Reviewed - No data to display  EKG None  Radiology No results found.  Procedures Procedures (including critical care time)  Medications Ordered in ED Medications - No data to display   Initial Impression / Assessment and Plan / ED Course  I have reviewed the triage vital signs and the nursing notes.  Pertinent labs & imaging results that were available during my care of the patient were reviewed by me and considered in my medical decision making (see chart for details).       Spoke with Dr. Sherren Mocha early who asked me to remove the wound VAC, redressed the wound with Curlex and Ace wrap to apply direct pressure and observe for about 30 minutes.  I did remove the wound VAC and his wounds are oozing dark blood.  No spurting or evidence of arterial bleed.    Patient observed in the ER for 30 minutes.  The wound was redressed.  There is no obvious bleeding.  Nothing is soaked through the bandage gauze.  Patient will be discharged to follow closely with his surgeon.  Appears appropriate for discharge at this time  Final Clinical Impressions(s) / ED Diagnoses   Final diagnoses:  Postoperative hemorrhage of musculoskeletal structure following musculoskeletal procedure    ED Discharge Orders    None       Margarita Mail, PA-C 02/12/19 2202    Milton Ferguson, MD 02/15/19 1028

## 2019-02-11 NOTE — Progress Notes (Signed)
Dr. Trula Slade made aware that last dose of Plavix was 02/10/19.

## 2019-02-11 NOTE — ED Triage Notes (Signed)
Patient had a procedure to debride his R lower leg amputation this am. Patient comes in with acute bleeding from his wound. Unable to control bleeding in Triage. Patient placed in room and EDPa notified.

## 2019-02-11 NOTE — Op Note (Signed)
    Patient name: [name] MRN: [id number] DOB: 1951-04-24 Sex: male  02/11/2019 Pre-operative Diagnosis: Nonhealing right below-knee amputation Post-operative diagnosis:  Same Surgeon:  Annamarie Major Assistants:  Arlee Muslim Procedure: #1: Incision and drainage of medial and lateral aspect of right below-knee amputation.  The medial wound measured 3 x 2 x 1 cm.  The lateral wound measured 2 x 1 x 1 cm   #2: Placement of [name] wound VAC Anesthesia: General Blood Loss: Minimal Specimens: None  Findings: Necrotic fat was identified.  There was healthy capillary bleeding.  No purulence was found.  Indications: The patient has previously undergone right below-knee amputation by Dr. [name].  He has been seen in the office for open wounds which have not progressed and so operative exploration is recommended.  Procedure:  The patient was identified in the holding area and taken to [place] [facility]  The patient was then placed supine on the table. general anesthesia was administered.  The patient was prepped and draped in the usual sterile fashion.  A time out was called and antibiotics were administered.  The patient had a eschar and open area to the aspect of his below-knee amputation.  I made an elliptical incision around this area and removed the skin and subcutaneous fat down to the fascia.  The wound after excision measured 3 x 2 x 1 cm.  There is healthy bleeding from the tissue.  There is no obvious purulence.  Attention was then turned towards the lateral aspect where I made an elliptical incision incorporating the open wounds.  I excised the skin and subcutaneous fat.  There was necrotic fat creating the nonhealing wound.  No foreign bodies were identified.  Again there was excellent capillary bleeding.  The depth of the lateral wound was 2 x 1 x 1 cm.  Once the wounds were cleaned, I closed the incisions with interrupted 2-0 nylon vertical mattress sutures and then placed a [name] wound  VAC.  Patient tolerated the procedure well there were no immediate complications.   Disposition: To PACU stable.   Theotis Burrow, M.D., [facility] [place] Office: [phone] Pager:  [phone]

## 2019-02-11 NOTE — Anesthesia Procedure Notes (Signed)
Procedure Name: LMA Insertion Date/Time: 02/11/2019 10:22 AM Performed by: Moshe Salisbury, CRNA Pre-anesthesia Checklist: Patient identified, Emergency Drugs available, Suction available and Patient being monitored Patient Re-evaluated:Patient Re-evaluated prior to induction Oxygen Delivery Method: Circle System Utilized Preoxygenation: Pre-oxygenation with 100% oxygen Induction Type: IV induction Ventilation: Mask ventilation without difficulty LMA: LMA inserted LMA Size: 5.0 Number of attempts: 1 Placement Confirmation: positive ETCO2 Tube secured with: Tape Dental Injury: Teeth and Oropharynx as per pre-operative assessment

## 2019-02-11 NOTE — Discharge Instructions (Signed)
Contact a health care provider if: You have a fever. The swelling or discoloration gets worse. You develop more hematomas. Get help right away if: Your pain is worse or your pain is not controlled with medicine. Your skin over the hematoma breaks or starts bleeding. Your hematoma is in your chest or abdomen and you have weakness, shortness of breath, or a change in consciousness. You have a hematoma on your scalp that is caused by a fall or injury, and you also have: A headache that gets worse. Trouble speaking or understanding speech. Weakness. Change in alertness or consciousness.

## 2019-02-12 ENCOUNTER — Encounter (HOSPITAL_COMMUNITY): Payer: Self-pay | Admitting: Surgery

## 2019-02-12 DIAGNOSIS — D631 Anemia in chronic kidney disease: Secondary | ICD-10-CM | POA: Diagnosis not present

## 2019-02-12 DIAGNOSIS — N2581 Secondary hyperparathyroidism of renal origin: Secondary | ICD-10-CM | POA: Diagnosis not present

## 2019-02-12 DIAGNOSIS — N186 End stage renal disease: Secondary | ICD-10-CM | POA: Diagnosis not present

## 2019-02-12 DIAGNOSIS — L97428 Non-pressure chronic ulcer of left heel and midfoot with other specified severity: Secondary | ICD-10-CM | POA: Diagnosis not present

## 2019-02-12 DIAGNOSIS — E1122 Type 2 diabetes mellitus with diabetic chronic kidney disease: Secondary | ICD-10-CM | POA: Diagnosis not present

## 2019-02-12 DIAGNOSIS — D509 Iron deficiency anemia, unspecified: Secondary | ICD-10-CM | POA: Diagnosis not present

## 2019-02-12 DIAGNOSIS — Z4781 Encounter for orthopedic aftercare following surgical amputation: Secondary | ICD-10-CM | POA: Diagnosis not present

## 2019-02-12 DIAGNOSIS — Z992 Dependence on renal dialysis: Secondary | ICD-10-CM | POA: Diagnosis not present

## 2019-02-12 DIAGNOSIS — I120 Hypertensive chronic kidney disease with stage 5 chronic kidney disease or end stage renal disease: Secondary | ICD-10-CM | POA: Diagnosis not present

## 2019-02-12 DIAGNOSIS — E11621 Type 2 diabetes mellitus with foot ulcer: Secondary | ICD-10-CM | POA: Diagnosis not present

## 2019-02-14 DIAGNOSIS — N186 End stage renal disease: Secondary | ICD-10-CM | POA: Diagnosis not present

## 2019-02-14 DIAGNOSIS — N2581 Secondary hyperparathyroidism of renal origin: Secondary | ICD-10-CM | POA: Diagnosis not present

## 2019-02-14 DIAGNOSIS — D509 Iron deficiency anemia, unspecified: Secondary | ICD-10-CM | POA: Diagnosis not present

## 2019-02-14 DIAGNOSIS — D631 Anemia in chronic kidney disease: Secondary | ICD-10-CM | POA: Diagnosis not present

## 2019-02-14 DIAGNOSIS — Z992 Dependence on renal dialysis: Secondary | ICD-10-CM | POA: Diagnosis not present

## 2019-02-16 DIAGNOSIS — Z4781 Encounter for orthopedic aftercare following surgical amputation: Secondary | ICD-10-CM | POA: Diagnosis not present

## 2019-02-16 DIAGNOSIS — E1122 Type 2 diabetes mellitus with diabetic chronic kidney disease: Secondary | ICD-10-CM | POA: Diagnosis not present

## 2019-02-16 DIAGNOSIS — N186 End stage renal disease: Secondary | ICD-10-CM | POA: Diagnosis not present

## 2019-02-16 DIAGNOSIS — E11621 Type 2 diabetes mellitus with foot ulcer: Secondary | ICD-10-CM | POA: Diagnosis not present

## 2019-02-16 DIAGNOSIS — I120 Hypertensive chronic kidney disease with stage 5 chronic kidney disease or end stage renal disease: Secondary | ICD-10-CM | POA: Diagnosis not present

## 2019-02-16 DIAGNOSIS — L97428 Non-pressure chronic ulcer of left heel and midfoot with other specified severity: Secondary | ICD-10-CM | POA: Diagnosis not present

## 2019-02-17 DIAGNOSIS — Z992 Dependence on renal dialysis: Secondary | ICD-10-CM | POA: Diagnosis not present

## 2019-02-17 DIAGNOSIS — D631 Anemia in chronic kidney disease: Secondary | ICD-10-CM | POA: Diagnosis not present

## 2019-02-17 DIAGNOSIS — N2581 Secondary hyperparathyroidism of renal origin: Secondary | ICD-10-CM | POA: Diagnosis not present

## 2019-02-17 DIAGNOSIS — N186 End stage renal disease: Secondary | ICD-10-CM | POA: Diagnosis not present

## 2019-02-17 DIAGNOSIS — D509 Iron deficiency anemia, unspecified: Secondary | ICD-10-CM | POA: Diagnosis not present

## 2019-02-18 DIAGNOSIS — J449 Chronic obstructive pulmonary disease, unspecified: Secondary | ICD-10-CM | POA: Diagnosis not present

## 2019-02-18 DIAGNOSIS — E1142 Type 2 diabetes mellitus with diabetic polyneuropathy: Secondary | ICD-10-CM | POA: Diagnosis not present

## 2019-02-18 DIAGNOSIS — F419 Anxiety disorder, unspecified: Secondary | ICD-10-CM | POA: Diagnosis not present

## 2019-02-18 DIAGNOSIS — Z87891 Personal history of nicotine dependence: Secondary | ICD-10-CM | POA: Diagnosis not present

## 2019-02-18 DIAGNOSIS — E1122 Type 2 diabetes mellitus with diabetic chronic kidney disease: Secondary | ICD-10-CM | POA: Diagnosis not present

## 2019-02-18 DIAGNOSIS — K219 Gastro-esophageal reflux disease without esophagitis: Secondary | ICD-10-CM | POA: Diagnosis not present

## 2019-02-18 DIAGNOSIS — Z9181 History of falling: Secondary | ICD-10-CM | POA: Diagnosis not present

## 2019-02-18 DIAGNOSIS — F329 Major depressive disorder, single episode, unspecified: Secondary | ICD-10-CM | POA: Diagnosis not present

## 2019-02-18 DIAGNOSIS — M1990 Unspecified osteoarthritis, unspecified site: Secondary | ICD-10-CM | POA: Diagnosis not present

## 2019-02-18 DIAGNOSIS — E039 Hypothyroidism, unspecified: Secondary | ICD-10-CM | POA: Diagnosis not present

## 2019-02-18 DIAGNOSIS — Z89511 Acquired absence of right leg below knee: Secondary | ICD-10-CM | POA: Diagnosis not present

## 2019-02-18 DIAGNOSIS — E1151 Type 2 diabetes mellitus with diabetic peripheral angiopathy without gangrene: Secondary | ICD-10-CM | POA: Diagnosis not present

## 2019-02-18 DIAGNOSIS — Z794 Long term (current) use of insulin: Secondary | ICD-10-CM | POA: Diagnosis not present

## 2019-02-18 DIAGNOSIS — Z992 Dependence on renal dialysis: Secondary | ICD-10-CM | POA: Diagnosis not present

## 2019-02-18 DIAGNOSIS — N186 End stage renal disease: Secondary | ICD-10-CM | POA: Diagnosis not present

## 2019-02-18 DIAGNOSIS — I120 Hypertensive chronic kidney disease with stage 5 chronic kidney disease or end stage renal disease: Secondary | ICD-10-CM | POA: Diagnosis not present

## 2019-02-18 DIAGNOSIS — E785 Hyperlipidemia, unspecified: Secondary | ICD-10-CM | POA: Diagnosis not present

## 2019-02-18 DIAGNOSIS — Z4781 Encounter for orthopedic aftercare following surgical amputation: Secondary | ICD-10-CM | POA: Diagnosis not present

## 2019-02-19 DIAGNOSIS — D509 Iron deficiency anemia, unspecified: Secondary | ICD-10-CM | POA: Diagnosis not present

## 2019-02-19 DIAGNOSIS — N2581 Secondary hyperparathyroidism of renal origin: Secondary | ICD-10-CM | POA: Diagnosis not present

## 2019-02-19 DIAGNOSIS — D631 Anemia in chronic kidney disease: Secondary | ICD-10-CM | POA: Diagnosis not present

## 2019-02-19 DIAGNOSIS — N186 End stage renal disease: Secondary | ICD-10-CM | POA: Diagnosis not present

## 2019-02-19 DIAGNOSIS — Z992 Dependence on renal dialysis: Secondary | ICD-10-CM | POA: Diagnosis not present

## 2019-02-21 ENCOUNTER — Encounter (HOSPITAL_COMMUNITY): Payer: Self-pay | Admitting: *Deleted

## 2019-02-21 ENCOUNTER — Emergency Department (HOSPITAL_COMMUNITY)
Admission: EM | Admit: 2019-02-21 | Discharge: 2019-02-22 | Disposition: A | Discharge status: Home / Self Care | Payer: Medicare Other | Attending: Emergency Medicine | Admitting: Emergency Medicine

## 2019-02-21 ENCOUNTER — Other Ambulatory Visit: Payer: Self-pay

## 2019-02-21 DIAGNOSIS — D631 Anemia in chronic kidney disease: Secondary | ICD-10-CM | POA: Diagnosis not present

## 2019-02-21 DIAGNOSIS — I129 Hypertensive chronic kidney disease with stage 1 through stage 4 chronic kidney disease, or unspecified chronic kidney disease: Secondary | ICD-10-CM | POA: Diagnosis not present

## 2019-02-21 DIAGNOSIS — E1151 Type 2 diabetes mellitus with diabetic peripheral angiopathy without gangrene: Secondary | ICD-10-CM | POA: Diagnosis not present

## 2019-02-21 DIAGNOSIS — N189 Chronic kidney disease, unspecified: Secondary | ICD-10-CM | POA: Diagnosis not present

## 2019-02-21 DIAGNOSIS — E1140 Type 2 diabetes mellitus with diabetic neuropathy, unspecified: Secondary | ICD-10-CM | POA: Diagnosis not present

## 2019-02-21 DIAGNOSIS — I120 Hypertensive chronic kidney disease with stage 5 chronic kidney disease or end stage renal disease: Secondary | ICD-10-CM | POA: Diagnosis not present

## 2019-02-21 DIAGNOSIS — N186 End stage renal disease: Secondary | ICD-10-CM | POA: Diagnosis not present

## 2019-02-21 DIAGNOSIS — F17290 Nicotine dependence, other tobacco product, uncomplicated: Secondary | ICD-10-CM | POA: Diagnosis not present

## 2019-02-21 DIAGNOSIS — D509 Iron deficiency anemia, unspecified: Secondary | ICD-10-CM | POA: Diagnosis not present

## 2019-02-21 DIAGNOSIS — L089 Local infection of the skin and subcutaneous tissue, unspecified: Secondary | ICD-10-CM | POA: Insufficient documentation

## 2019-02-21 DIAGNOSIS — Z79899 Other long term (current) drug therapy: Secondary | ICD-10-CM | POA: Diagnosis not present

## 2019-02-21 DIAGNOSIS — Z4889 Encounter for other specified surgical aftercare: Secondary | ICD-10-CM

## 2019-02-21 DIAGNOSIS — E1122 Type 2 diabetes mellitus with diabetic chronic kidney disease: Secondary | ICD-10-CM | POA: Insufficient documentation

## 2019-02-21 DIAGNOSIS — Z992 Dependence on renal dialysis: Secondary | ICD-10-CM | POA: Diagnosis not present

## 2019-02-21 DIAGNOSIS — E1142 Type 2 diabetes mellitus with diabetic polyneuropathy: Secondary | ICD-10-CM | POA: Diagnosis not present

## 2019-02-21 DIAGNOSIS — N2581 Secondary hyperparathyroidism of renal origin: Secondary | ICD-10-CM | POA: Diagnosis not present

## 2019-02-21 DIAGNOSIS — Z4781 Encounter for orthopedic aftercare following surgical amputation: Secondary | ICD-10-CM | POA: Diagnosis not present

## 2019-02-21 NOTE — ED Notes (Signed)
Patient sister [phone]. Patient wife is with patient sister.

## 2019-02-21 NOTE — ED Triage Notes (Signed)
Pt had a bka two weeks ago. Reports redness and increased swelling to incision site. Spoke to Dr.Fields who wanted to see pt in ER per pt

## 2019-02-22 ENCOUNTER — Other Ambulatory Visit: Payer: Self-pay

## 2019-02-22 NOTE — Discharge Instructions (Addendum)
Dr. [name] is recommended that you change your dressing from wet-to-dry to santyl once daily starting tomorrow.  Please inform your home nursing staff about that change.

## 2019-02-22 NOTE — ED Provider Notes (Signed)
[facility] EMERGENCY DEPARTMENT Provider Note   CSN: [id number] Arrival date & time: 02/21/19  2136    History   Chief Complaint Chief Complaint  Patient presents with  . Post-op Problem    HPI [name] is a 68 y.o. male.     HPI 69 year old male with history of diabetes, COPD, right-sided BKA with recent debridement by vascular surgery comes in a chief complaint of wound evaluation.  Patient's home nurse is concerned that the wound might be getting infected and called vascular surgery.  Dr. [name] wanted patient to come to the ER for evaluation.  He denies any increased pain, fevers, chills, nausea, vomiting.  He is here because the nurse was concerned, and he wants Dr. [name] to assess him so that he can figure out whether when he can get home.  Past Medical History:  Diagnosis Date  . Anemia   . Anxiety   . Arthritis   . Chronic kidney disease   . COPD (chronic obstructive pulmonary disease) ([place])    Pt denies  . Depression   . Diabetes mellitus without complication ([place])   . GERD (gastroesophageal reflux disease)   . H/O hiatal hernia   . Headache(784.0)    Hx: Migraines  . Hypertension   . Neuropathy   . Non-healing wound of amputation stump ([place])    right  . Numbness of toes    toes and feet  . Pneumonia    Hx: of several times  . Renal insufficiency   . Shortness of breath dyspnea   . Type 2 diabetes mellitus [facility])     Patient Active Problem List   Diagnosis Date Noted  . Post-operative pain   . Labile blood pressure   . Labile blood glucose   . Hx of anxiety disorder   . Hypoglycemia   . Diabetic peripheral neuropathy ([place])   . Neuropathic pain   . S/P unilateral BKA (below knee amputation), right ([place])   . Sleep disturbance   . Type 2 diabetes mellitus with peripheral neuropathy (HCC)   . Benign essential HTN   . Acute blood loss anemia   . Anemia of chronic disease   . Right below-knee amputee ([place]) 12/03/2018   . Necrotizing fasciitis of ankle and foot ([place])   . Unilateral complete BKA, right, initial encounter ([place])   . Postoperative pain   . Phantom limb pain ([place])   . Drug induced constipation   . Poorly controlled type 2 diabetes mellitus with peripheral neuropathy ([place])   . Acute on chronic anemia   . Ischemia of extremity 12/01/2018  . PAD (peripheral artery disease) ([place]) 02/12/2017  . Critical lower limb ischemia 02/11/2017  . Acute respiratory failure with hypoxia ([place]) 08/28/2015  . Hypertensive urgency 08/28/2015  . Chronic anemia 08/28/2015  . ESRD on dialysis ([place]) 08/28/2015  . Diabetes mellitus with end stage renal disease ([place]) 08/28/2015  . PVD (peripheral vascular disease) ([place]) 07/15/2014  . Peripheral vascular disease, unspecified ([place]) 01/14/2014  . Pain in limb-left arm 04/09/2013  . Guaiac + stool 04/07/2013  . Anemia 04/07/2013  . Dysphagia, unspecified(787.20) 04/07/2013  . Numbness and tingling-left arm  02/04/2013  . Cold hands and feet-left arm 02/04/2013  . Other complications due to renal dialysis device, implant, and graft 06/06/2012  . End stage renal disease ([place]) 02/05/2012  . Injury to blood vessels, unspecified site 02/05/2012  . Pain in joint, shoulder region 09/14/2011    Past Surgical History:  Procedure  Laterality Date  . A/V FISTULAGRAM Left 01/16/2019   Procedure: A/V FISTULAGRAM;  Surgeon: [name], MD;  Location: [place] [facility];  Service: Cardiovascular;  Laterality: Left;  . AMPUTATION Right 12/01/2018   Procedure: RIGHT AMPUTATION BELOW KNEE;  Surgeon: [name], MD;  Location: [place];  Service: Vascular;  Laterality: Right;  . AV FISTULA PLACEMENT  2012      left arm   . AV FISTULA PLACEMENT Left 12/18/2012   Procedure: ARTERIOVENOUS (AV) FISTULA CREATION;  Surgeon: Angelia Mould, MD;  Location: [place];  Service: Vascular;  Laterality: Left;  . COLONOSCOPY  10/26/2011   Procedure: COLONOSCOPY;  Surgeon: Rogene Houston, MD;  [facility];  Service: Endoscopy;  Laterality: N/A;  730  . EMBOLECTOMY Left 12/09/2012   Procedure: EMBOLECTOMY;  Surgeon: Serafina Mitchell, MD;  Location: [facility] CATH LAB;  Service: Cardiovascular;  Laterality: Left;  left arm venous embolization  . ESOPHAGOGASTRODUODENOSCOPY (EGD) WITH ESOPHAGEAL DILATION N/A 04/23/2013   Procedure: ESOPHAGOGASTRODUODENOSCOPY (EGD) WITH ESOPHAGEAL DILATION;  Surgeon: Rogene Houston, MD;  [facility];  Service: Endoscopy;  Laterality: N/A;  200-moved to 930   . FISTULA SUPERFICIALIZATION Left 06/18/2013   Procedure: FISTULA SUPERFICIALIZATION & LIGATION BRANCH X 1;  Surgeon: Mal Misty, MD;  Location: [place];  Service: Vascular;  Laterality: Left;  . HEMATOMA EVACUATION Right 02/11/2017   Procedure: EVACUATION HEMATOMA RIGHT GROIN, Repair of Right Pseudo-anerysm.;  Surgeon: [name], MD;  [facility];  Service: Vascular;  Laterality: Right;  . INGUINAL HERNIA REPAIR     ,  times   2  . INSERTION OF DIALYSIS CATHETER Left 12/18/2012   Procedure: INSERTION OF DIALYSIS CATHETER;  Surgeon: Angelia Mould, MD;  Location: [place];  Service: Vascular;  Laterality: Left;  . KNEE ARTHROSCOPY  2011   Right Knee  . LOWER EXTREMITY ANGIOGRAPHY N/A 02/11/2017   Procedure: Lower Extremity Angiography;  Surgeon: Lorretta Harp, MD;  Location: [place] CV LAB;  Service: Cardiovascular;  Laterality: N/A;  . PERIPHERAL ATHRECTOMY  02/11/2017  . PERIPHERAL VASCULAR ATHERECTOMY Left 02/11/2017   Procedure: Peripheral Vascular Atherectomy;  Surgeon: Lorretta Harp, MD;  Location: [place] CV LAB;  Service: Cardiovascular;  Laterality: Left;  . PERIPHERAL VASCULAR BALLOON ANGIOPLASTY Left 01/16/2019   Procedure: PERIPHERAL VASCULAR BALLOON ANGIOPLASTY;  Surgeon: [name], MD;  Location: [place] [facility] LAB;  Service: Cardiovascular;  Laterality: Left;  central vein  . REVISON OF ARTERIOVENOUS FISTULA Left 02/10/1600    Procedure: PLICATION OF LEFT BRACHIOCEPHALIC ARTERIOVENOUS FISTULA;  Surgeon: Conrad [place], MD;  Location: [place];  Service: Vascular;  Laterality: Left;  . SHUNTOGRAM N/A 12/09/2012   Procedure: fistulogram;  Surgeon: Serafina Mitchell, MD;  Location: [facility] CATH LAB;  Service: Cardiovascular;  Laterality: N/A;  . SHUNTOGRAM Left 06/03/2013   Procedure: Fistulogram;  Surgeon: Serafina Mitchell, MD;  Location: [facility] CATH LAB;  Service: Cardiovascular;  Laterality: Left;  . THROMBECTOMY W/ EMBOLECTOMY Left 12/11/2012   Procedure: THROMBECTOMY ARTERIOVENOUS FISTULA;  Surgeon: Serafina Mitchell, MD;  Location: [place];  Service: Vascular;  Laterality: Left;  . TONSILLECTOMY    . WOUND DEBRIDEMENT Right 02/11/2019   Procedure: DEBRIDEMENT WOUND RIGHT BELOW THE KNEE STUMP;  Surgeon: Serafina Mitchell, MD;  [facility];  Service: Vascular;  Laterality: Right;        Home Medications    Prior to Admission medications   Medication Sig  Start Date End Date Taking? Authorizing Provider  ARTIFICIAL TEAR OP Place 1 drop into both eyes every 6 (six) hours as needed (dry eyes).    [provider]  atorvastatin (LIPITOR) 80 MG tablet Take 1 tablet (80 mg total) by mouth at bedtime. 12/18/18   Angiulli, Lavon Paganini, PA-C  calcium acetate (PHOSLO) 667 MG capsule Take 4 capsules (2,668 mg total) by mouth 3 (three) times daily with meals. 12/19/18   Angiulli, Lavon Paganini, PA-C  cinacalcet (SENSIPAR) 30 MG tablet Take 1 tablet (30 mg total) by mouth every evening. 12/19/18   Angiulli, Lavon Paganini, PA-C  clopidogrel (PLAVIX) 75 MG tablet Take 1 tablet (75 mg total) by mouth daily. 12/18/18   Angiulli, Lavon Paganini, PA-C  levothyroxine (SYNTHROID) 125 MCG tablet Take 1 tablet (125 mcg total) by mouth daily before breakfast. 12/18/18   Angiulli, Lavon Paganini, PA-C  lisinopril (ZESTRIL) 10 MG tablet Take 10 mg by mouth daily.    [provider]  oxyCODONE-acetaminophen (PERCOCET/ROXICET) 5-325 MG tablet Take 1 tablet by mouth every 4 (four)  hours as needed for severe pain. 02/11/19   Dagoberto Ligas, PA-C  pregabalin (LYRICA) 150 MG capsule Take 1 capsule (150 mg total) by mouth 3 (three) times daily. 12/18/18   Angiulli, Lavon Paganini, PA-C  risperiDONE (RISPERDAL) 0.25 MG tablet Take 1 tablet (0.25 mg total) by mouth daily. 12/18/18   Angiulli, Lavon Paganini, PA-C  TOUJEO SOLOSTAR 300 UNIT/ML SOPN Inject 54 Units as directed daily. 01/12/19   [provider]    Family History Family History  Problem Relation Age of Onset  . Heart disease Father        Heart Disease before age 74    Social History Social History   Tobacco Use  . Smoking status: Current Every Day Smoker    Years: 10.00    Types: Cigars    Last attempt to quit: 03/13/2012    Years since quitting: 6.9  . Smokeless tobacco: Never Used  . Tobacco comment: smokes 6-8 cigarettes daily  Substance Use Topics  . Alcohol use: No    Alcohol/week: 0.0 standard drinks  . Drug use: No     Allergies   Patient has no known allergies.   Review of Systems Review of Systems  Constitutional: Negative for activity change and fever.  Gastrointestinal: Negative for nausea and vomiting.  Skin: Positive for wound.  Allergic/Immunologic: Negative for immunocompromised state.     Physical Exam Updated Vital Signs BP 131/72   Pulse 71   Temp 98.6 F (37 C)   Resp 20   SpO2 99%   Physical Exam Vitals signs and nursing note reviewed.  Constitutional:      Appearance: He is well-developed.  HENT:     Head: Atraumatic.  Neck:     Musculoskeletal: Neck supple.  Cardiovascular:     Rate and Rhythm: Normal rate.  Pulmonary:     Effort: Pulmonary effort is normal.  Musculoskeletal:     Comments: Patient has a bandaged right lower extremity with BKA.  Skin:    General: Skin is warm.  Neurological:     Mental Status: He is alert and oriented to person, place, and time.      ED Treatments / Results  Labs (all labs ordered are listed, but only abnormal  results are displayed) Labs Reviewed - No data to display  EKG None  Radiology No results found.  Procedures Procedures (including critical care time)  Medications Ordered in ED Medications -  No data to display   Initial Impression / Assessment and Plan / ED Course  I have reviewed the triage vital signs and the nursing notes.  Pertinent labs & imaging results that were available during my care of the patient were reviewed by me and considered in my medical decision making (see chart for details).        Wound looks healthy.  I assisted after Dr. [name].  Dr. [name] has assessed the patient and recommending that he go home with-sterile dressing in the ER.   Final Clinical Impressions(s) / ED Diagnoses   Final diagnoses:  Encounter for post surgical wound check    ED Discharge Orders    None       Varney Biles, MD 02/22/19 [phone]

## 2019-02-22 NOTE — Consult Note (Signed)
Patient name: [name] MRN: [id number] DOB: 10/09/1950 Sex: male   HPI: [name] is a 68 y.o. male s/p right BKA 12/01/18 by me.  He subsequently underwent debridement by Dr Trula Slade 7/1.  He noticed the skin began to separate a few days ago and got concerned.  He denies fever or chills.  He has about 20 cc per day of clear drainage.  Past Medical History:  Diagnosis Date  . Anemia   . Anxiety   . Arthritis   . Chronic kidney disease   . COPD (chronic obstructive pulmonary disease) ([place])    Pt denies  . Depression   . Diabetes mellitus without complication ([place])   . GERD (gastroesophageal reflux disease)   . H/O hiatal hernia   . Headache(784.0)    Hx: Migraines  . Hypertension   . Neuropathy   . Non-healing wound of amputation stump ([place])    right  . Numbness of toes    toes and feet  . Pneumonia    Hx: of several times  . Renal insufficiency   . Shortness of breath dyspnea   . Type 2 diabetes mellitus ([place])    Past Surgical History:  Procedure Laterality Date  . A/V FISTULAGRAM Left 01/16/2019   Procedure: A/V FISTULAGRAM;  Surgeon: [name], MD;  Location: [place] [facility];  Service: Cardiovascular;  Laterality: Left;  . AMPUTATION Right 12/01/2018   Procedure: RIGHT AMPUTATION BELOW KNEE;  Surgeon: [name], MD;  Location: [place];  Service: Vascular;  Laterality: Right;  . AV FISTULA PLACEMENT  2012      left arm   . AV FISTULA PLACEMENT Left 12/18/2012   Procedure: ARTERIOVENOUS (AV) FISTULA CREATION;  Surgeon: Angelia Mould, MD;  Location: [place];  Service: Vascular;  Laterality: Left;  . COLONOSCOPY  10/26/2011   Procedure: COLONOSCOPY;  Surgeon: Rogene Houston, MD;  [facility];  Service: Endoscopy;  Laterality: N/A;  730  . EMBOLECTOMY Left 12/09/2012   Procedure: EMBOLECTOMY;  Surgeon: Serafina Mitchell, MD;  Location: [facility] CATH LAB;  Service: Cardiovascular;  Laterality: Left;  left arm venous embolization  .  ESOPHAGOGASTRODUODENOSCOPY (EGD) WITH ESOPHAGEAL DILATION N/A 04/23/2013   Procedure: ESOPHAGOGASTRODUODENOSCOPY (EGD) WITH ESOPHAGEAL DILATION;  Surgeon: Rogene Houston, MD;  [facility];  Service: Endoscopy;  Laterality: N/A;  200-moved to 930   . FISTULA SUPERFICIALIZATION Left 06/18/2013   Procedure: FISTULA SUPERFICIALIZATION & LIGATION BRANCH X 1;  Surgeon: Mal Misty, MD;  Location: [place];  Service: Vascular;  Laterality: Left;  . HEMATOMA EVACUATION Right 02/11/2017   Procedure: EVACUATION HEMATOMA RIGHT GROIN, Repair of Right Pseudo-anerysm.;  Surgeon: [name], MD;  [facility];  Service: Vascular;  Laterality: Right;  . INGUINAL HERNIA REPAIR     ,  times   2  . INSERTION OF DIALYSIS CATHETER Left 12/18/2012   Procedure: INSERTION OF DIALYSIS CATHETER;  Surgeon: Angelia Mould, MD;  Location: [place];  Service: Vascular;  Laterality: Left;  . KNEE ARTHROSCOPY  2011   Right Knee  . LOWER EXTREMITY ANGIOGRAPHY N/A 02/11/2017   Procedure: Lower Extremity Angiography;  Surgeon: Lorretta Harp, MD;  Location: [place] CV LAB;  Service: Cardiovascular;  Laterality: N/A;  . PERIPHERAL ATHRECTOMY  02/11/2017  . PERIPHERAL VASCULAR ATHERECTOMY Left 02/11/2017   Procedure: Peripheral Vascular Atherectomy;  Surgeon: Lorretta Harp, MD;  Location: [place] [facility];  Service: Cardiovascular;  Laterality: Left;  . PERIPHERAL VASCULAR BALLOON ANGIOPLASTY Left 01/16/2019   Procedure: PERIPHERAL VASCULAR BALLOON ANGIOPLASTY;  Surgeon: [name], MD;  Location: [place] [facility];  Service: Cardiovascular;  Laterality: Left;  central vein  . REVISON OF ARTERIOVENOUS FISTULA Left 10/02/2540   Procedure: PLICATION OF LEFT BRACHIOCEPHALIC ARTERIOVENOUS FISTULA;  Surgeon: Conrad [place]ura, MD;  Location: [place];  Service: Vascular;  Laterality: Left;  . SHUNTOGRAM N/A 12/09/2012   Procedure: fistulogram;  Surgeon: Serafina Mitchell, MD;  Location: [facility] CATH LAB;  Service:  Cardiovascular;  Laterality: N/A;  . SHUNTOGRAM Left 06/03/2013   Procedure: Fistulogram;  Surgeon: Serafina Mitchell, MD;  Location: [facility] CATH LAB;  Service: Cardiovascular;  Laterality: Left;  . THROMBECTOMY W/ EMBOLECTOMY Left 12/11/2012   Procedure: THROMBECTOMY ARTERIOVENOUS FISTULA;  Surgeon: Serafina Mitchell, MD;  Location: [place];  Service: Vascular;  Laterality: Left;  . TONSILLECTOMY    . WOUND DEBRIDEMENT Right 02/11/2019   Procedure: DEBRIDEMENT WOUND RIGHT BELOW THE KNEE STUMP;  Surgeon: Serafina Mitchell, MD;  Location: [facility] OR;  Service: Vascular;  Laterality: Right;    Family History  Problem Relation Age of Onset  . Heart disease Father        Heart Disease before age 15    SOCIAL HISTORY: Social History   Socioeconomic History  . Marital status: Married    Spouse name: Not on file  . Number of children: Not on file  . Years of education: Not on file  . Highest education level: Not on file  Occupational History  . Not on file  Social Needs  . Financial resource strain: Not on file  . Food insecurity    Worry: Not on file    Inability: Not on file  . Transportation needs    Medical: Not on file    Non-medical: Not on file  Tobacco Use  . Smoking status: Current Every Day Smoker    Years: 10.00    Types: Cigars    Last attempt to quit: 03/13/2012    Years since quitting: 6.9  . Smokeless tobacco: Never Used  . Tobacco comment: smokes 6-8 cigarettes daily  Substance and Sexual Activity  . Alcohol use: No    Alcohol/week: 0.0 standard drinks  . Drug use: No  . Sexual activity: Never  Lifestyle  . Physical activity    Days per week: Not on file    Minutes per session: Not on file  . Stress: Not on file  Relationships  . Social Herbalist on phone: Not on file    Gets together: Not on file    Attends religious service: Not on file    Active member of club or organization: Not on file    Attends meetings of clubs or organizations: Not on file     Relationship status: Not on file  . Intimate partner violence    Fear of current or ex partner: Not on file    Emotionally abused: Not on file    Physically abused: Not on file    Forced sexual activity: Not on file  Other Topics Concern  . Not on file  Social History Narrative  . Not on file    No Known Allergies  No current facility-administered medications for this encounter.    Current Outpatient Medications  Medication Sig Dispense Refill  . ARTIFICIAL TEAR OP Place 1 drop into both eyes every 6 (six) hours as needed (dry eyes).    Marland Kitchen  atorvastatin (LIPITOR) 80 MG tablet Take 1 tablet (80 mg total) by mouth at bedtime. 30 tablet 5  . calcium acetate (PHOSLO) 667 MG capsule Take 4 capsules (2,668 mg total) by mouth 3 (three) times daily with meals. 360 capsule 0  . cinacalcet (SENSIPAR) 30 MG tablet Take 1 tablet (30 mg total) by mouth every evening. 60 tablet 0  . clopidogrel (PLAVIX) 75 MG tablet Take 1 tablet (75 mg total) by mouth daily. 30 tablet 0  . levothyroxine (SYNTHROID) 125 MCG tablet Take 1 tablet (125 mcg total) by mouth daily before breakfast. 30 tablet 0  . lisinopril (ZESTRIL) 10 MG tablet Take 10 mg by mouth daily.    Marland Kitchen oxyCODONE-acetaminophen (PERCOCET/ROXICET) 5-325 MG tablet Take 1 tablet by mouth every 4 (four) hours as needed for severe pain. 20 tablet 0  . pregabalin (LYRICA) 150 MG capsule Take 1 capsule (150 mg total) by mouth 3 (three) times daily. 90 capsule 0  . risperiDONE (RISPERDAL) 0.25 MG tablet Take 1 tablet (0.25 mg total) by mouth daily. 30 tablet 0  . TOUJEO SOLOSTAR 300 UNIT/ML SOPN Inject 54 Units as directed daily.       Physical Examination  Vitals:   02/21/19 2148 02/21/19 2339 02/21/19 2342 02/22/19 0000  BP: 129/71 (!) 164/74 (!) 146/68 131/72  Pulse: 80 70 71   Resp: 16 18 10 20   Temp: 98.6 F (37 C)     SpO2: 100% 99% 99%     There is no height or weight on file to calculate BMI.         ASSESSMENT:  Slowly healing  BKA still at risk for failure   PLAN:  Change wound care from normal saline wet to dry to santyl once daily  Pt states he has 3 tubes at home  He has follow up on 7/20   [name], MD [facility]: [phone] Pager: [phone]

## 2019-02-24 DIAGNOSIS — N186 End stage renal disease: Secondary | ICD-10-CM | POA: Diagnosis not present

## 2019-02-24 DIAGNOSIS — N2581 Secondary hyperparathyroidism of renal origin: Secondary | ICD-10-CM | POA: Diagnosis not present

## 2019-02-24 DIAGNOSIS — D509 Iron deficiency anemia, unspecified: Secondary | ICD-10-CM | POA: Diagnosis not present

## 2019-02-24 DIAGNOSIS — Z992 Dependence on renal dialysis: Secondary | ICD-10-CM | POA: Diagnosis not present

## 2019-02-24 DIAGNOSIS — D631 Anemia in chronic kidney disease: Secondary | ICD-10-CM | POA: Diagnosis not present

## 2019-02-25 ENCOUNTER — Encounter: Payer: Medicare Other | Attending: Physical Medicine & Rehabilitation | Admitting: Physical Medicine & Rehabilitation

## 2019-02-25 ENCOUNTER — Other Ambulatory Visit: Payer: Self-pay

## 2019-02-25 ENCOUNTER — Encounter: Payer: Self-pay | Admitting: Physical Medicine & Rehabilitation

## 2019-02-25 VITALS — BP 122/76 | HR 69 | Temp 97.8°F | Ht 76.0 in | Wt 250.0 lb

## 2019-02-25 DIAGNOSIS — G8918 Other acute postprocedural pain: Secondary | ICD-10-CM

## 2019-02-25 DIAGNOSIS — Z89511 Acquired absence of right leg below knee: Secondary | ICD-10-CM | POA: Diagnosis not present

## 2019-02-25 DIAGNOSIS — M792 Neuralgia and neuritis, unspecified: Secondary | ICD-10-CM | POA: Diagnosis not present

## 2019-02-25 DIAGNOSIS — J449 Chronic obstructive pulmonary disease, unspecified: Secondary | ICD-10-CM | POA: Insufficient documentation

## 2019-02-25 DIAGNOSIS — N186 End stage renal disease: Secondary | ICD-10-CM

## 2019-02-25 DIAGNOSIS — I7090 Unspecified atherosclerosis: Secondary | ICD-10-CM | POA: Diagnosis not present

## 2019-02-25 DIAGNOSIS — E1142 Type 2 diabetes mellitus with diabetic polyneuropathy: Secondary | ICD-10-CM | POA: Diagnosis not present

## 2019-02-25 DIAGNOSIS — I120 Hypertensive chronic kidney disease with stage 5 chronic kidney disease or end stage renal disease: Secondary | ICD-10-CM | POA: Insufficient documentation

## 2019-02-25 DIAGNOSIS — F17210 Nicotine dependence, cigarettes, uncomplicated: Secondary | ICD-10-CM | POA: Diagnosis not present

## 2019-02-25 DIAGNOSIS — G546 Phantom limb syndrome with pain: Secondary | ICD-10-CM | POA: Diagnosis not present

## 2019-02-25 DIAGNOSIS — E1122 Type 2 diabetes mellitus with diabetic chronic kidney disease: Secondary | ICD-10-CM | POA: Diagnosis not present

## 2019-02-25 DIAGNOSIS — Z720 Tobacco use: Secondary | ICD-10-CM | POA: Diagnosis not present

## 2019-02-25 DIAGNOSIS — G479 Sleep disorder, unspecified: Secondary | ICD-10-CM | POA: Insufficient documentation

## 2019-02-25 DIAGNOSIS — F17290 Nicotine dependence, other tobacco product, uncomplicated: Secondary | ICD-10-CM | POA: Insufficient documentation

## 2019-02-25 DIAGNOSIS — Z992 Dependence on renal dialysis: Secondary | ICD-10-CM

## 2019-02-25 DIAGNOSIS — Z8249 Family history of ischemic heart disease and other diseases of the circulatory system: Secondary | ICD-10-CM | POA: Insufficient documentation

## 2019-02-25 DIAGNOSIS — G894 Chronic pain syndrome: Secondary | ICD-10-CM | POA: Diagnosis not present

## 2019-02-25 DIAGNOSIS — K219 Gastro-esophageal reflux disease without esophagitis: Secondary | ICD-10-CM | POA: Diagnosis not present

## 2019-02-25 NOTE — Progress Notes (Addendum)
Subjective:    Patient ID: [name], male    DOB: 02-May-1951, 68 y.o.   MRN: [id number]    HPI Right-handed male with history of COPD who quit smoking 6 years ago, end-stage renal disease with hemodialysis, diabetes mellitus with peripheral neuropathy, hypertension presents for hospital follow-up after receiving CIR for right BKA.    Last clinic visit on 01/14/2019.  Since that time, patient went to the ED and follow-up with vascular on a few occasions most recently for I&D, notes reviewed. Most recent ED visit 3 days ago, evaled by Surgery, told to cont dressing changes. He is still in therapies. Denies falls. CBGs have been labile. Continues to use wheelchair at all times.  Pain Inventory Average Pain 5 Pain Right Now 2 My pain is constant, burning, stabbing, aching and ghost pains  In the last 24 hours, has pain interfered with the following? General activity 5 Relation with others 5 Enjoyment of life 5 What TIME of day is your pain at its worst? night Sleep (in general) Poor  Pain is worse with: unsure and some activites Pain improves with: medication Relief from Meds: 6  Mobility use a wheelchair needs help with transfers  Function disabled: date disabled 2017  Neuro/Psych numbness tingling trouble walking depression anxiety  Prior Studies Any changes since last visit?  no  Physicians involved in your care Any changes since last visit?  no   Family History  Problem Relation Age of Onset  . Heart disease Father        Heart Disease before age 11   Social History   Socioeconomic History  . Marital status: Married    Spouse name: Not on file  . Number of children: Not on file  . Years of education: Not on file  . Highest education level: Not on file  Occupational History  . Not on file  Social Needs  . Financial resource strain: Not on file  . Food insecurity    Worry: Not on file    Inability: Not on file  . Transportation needs     Medical: Not on file    Non-medical: Not on file  Tobacco Use  . Smoking status: Current Every Day Smoker    Years: 10.00    Types: Cigars    Last attempt to quit: 03/13/2012    Years since quitting: 6.9  . Smokeless tobacco: Never Used  . Tobacco comment: smokes 6-8 cigarettes daily  Substance and Sexual Activity  . Alcohol use: No    Alcohol/week: 0.0 standard drinks  . Drug use: No  . Sexual activity: Never  Lifestyle  . Physical activity    Days per week: Not on file    Minutes per session: Not on file  . Stress: Not on file  Relationships  . Social Herbalist on phone: Not on file    Gets together: Not on file    Attends religious service: Not on file    Active member of club or organization: Not on file    Attends meetings of clubs or organizations: Not on file    Relationship status: Not on file  Other Topics Concern  . Not on file  Social History Narrative  . Not on file   Past Surgical History:  Procedure Laterality Date  . A/V FISTULAGRAM Left 01/16/2019   Procedure: A/V FISTULAGRAM;  Surgeon: [name], MD;  Location: [place] [facility];  Service: Cardiovascular;  Laterality: Left;  .  AMPUTATION Right 12/01/2018   Procedure: RIGHT AMPUTATION BELOW KNEE;  Surgeon: [name], MD;  Location: [facility];  Service: Vascular;  Laterality: Right;  . AV FISTULA PLACEMENT  2012      left arm   . AV FISTULA PLACEMENT Left 12/18/2012   Procedure: ARTERIOVENOUS (AV) FISTULA CREATION;  Surgeon: Angelia Mould, MD;  Location: [place];  Service: Vascular;  Laterality: Left;  . COLONOSCOPY  10/26/2011   Procedure: COLONOSCOPY;  Surgeon: Rogene Houston, MD;  [facility];  Service: Endoscopy;  Laterality: N/A;  730  . EMBOLECTOMY Left 12/09/2012   Procedure: EMBOLECTOMY;  Surgeon: Serafina Mitchell, MD;  Location: [facility] CATH LAB;  Service: Cardiovascular;  Laterality: Left;  left arm venous embolization  . ESOPHAGOGASTRODUODENOSCOPY (EGD) WITH  ESOPHAGEAL DILATION N/A 04/23/2013   Procedure: ESOPHAGOGASTRODUODENOSCOPY (EGD) WITH ESOPHAGEAL DILATION;  Surgeon: Rogene Houston, MD;  [facility];  Service: Endoscopy;  Laterality: N/A;  200-moved to 930   . FISTULA SUPERFICIALIZATION Left 06/18/2013   Procedure: FISTULA SUPERFICIALIZATION & LIGATION BRANCH X 1;  Surgeon: Mal Misty, MD;  Location: [place];  Service: Vascular;  Laterality: Left;  . HEMATOMA EVACUATION Right 02/11/2017   Procedure: EVACUATION HEMATOMA RIGHT GROIN, Repair of Right Pseudo-anerysm.;  Surgeon: [name], MD;  [facility];  Service: Vascular;  Laterality: Right;  . INGUINAL HERNIA REPAIR     ,  times   2  . INSERTION OF DIALYSIS CATHETER Left 12/18/2012   Procedure: INSERTION OF DIALYSIS CATHETER;  Surgeon: Angelia Mould, MD;  Location: [place];  Service: Vascular;  Laterality: Left;  . KNEE ARTHROSCOPY  2011   Right Knee  . LOWER EXTREMITY ANGIOGRAPHY N/A 02/11/2017   Procedure: Lower Extremity Angiography;  Surgeon: Lorretta Harp, MD;  Location: [place] [facility];  Service: Cardiovascular;  Laterality: N/A;  . PERIPHERAL ATHRECTOMY  02/11/2017  . PERIPHERAL VASCULAR ATHERECTOMY Left 02/11/2017   Procedure: Peripheral Vascular Atherectomy;  Surgeon: Lorretta Harp, MD;  Location: [place] [facility];  Service: Cardiovascular;  Laterality: Left;  . PERIPHERAL VASCULAR BALLOON ANGIOPLASTY Left 01/16/2019   Procedure: PERIPHERAL VASCULAR BALLOON ANGIOPLASTY;  Surgeon: [name], MD;  Location: [place] [facility];  Service: Cardiovascular;  Laterality: Left;  central vein  . REVISON OF ARTERIOVENOUS FISTULA Left 3/35/4562   Procedure: PLICATION OF LEFT BRACHIOCEPHALIC ARTERIOVENOUS FISTULA;  Surgeon: Conrad , MD;  Location: [place];  Service: Vascular;  Laterality: Left;  . SHUNTOGRAM N/A 12/09/2012   Procedure: fistulogram;  Surgeon: Serafina Mitchell, MD;  [facility]: [facility];  Service: Cardiovascular;  Laterality: N/A;  .  SHUNTOGRAM Left 06/03/2013   Procedure: Fistulogram;  Surgeon: Serafina Mitchell, MD;  Location: [facility] CATH LAB;  Service: Cardiovascular;  Laterality: Left;  . THROMBECTOMY W/ EMBOLECTOMY Left 12/11/2012   Procedure: THROMBECTOMY ARTERIOVENOUS FISTULA;  Surgeon: Serafina Mitchell, MD;  Location: [place];  Service: Vascular;  Laterality: Left;  . TONSILLECTOMY    . WOUND DEBRIDEMENT Right 02/11/2019   Procedure: DEBRIDEMENT WOUND RIGHT BELOW THE KNEE STUMP;  Surgeon: Serafina Mitchell, MD;  Location: [facility] OR;  Service: Vascular;  Laterality: Right;   Past Medical History:  Diagnosis Date  . Anemia   . Anxiety   . Arthritis   . Chronic kidney disease   . COPD (chronic obstructive pulmonary disease) ([place])    Pt denies  . Depression   . Diabetes mellitus without complication ([place])   .  GERD (gastroesophageal reflux disease)   . H/O hiatal hernia   . Headache(784.0)    Hx: Migraines  . Hypertension   . Neuropathy   . Non-healing wound of amputation stump ([place])    right  . Numbness of toes    toes and feet  . Pneumonia    Hx: of several times  . Renal insufficiency   . Shortness of breath dyspnea   . Type 2 diabetes mellitus (HCC)    BP 122/76   Pulse 69   Temp 97.8 F (36.6 C)   Ht 6\' 4"  (1.93 m)   Wt 250 lb (113.4 kg)   SpO2 99%   BMI 30.43 kg/m   Opioid Risk Score:   Fall Risk Score:  `1  Depression screen PHQ 2/9  No flowsheet data found.   Review of Systems  Constitutional: Negative.   HENT: Negative.   Eyes: Negative.   Respiratory: Negative.   Cardiovascular: Positive for leg swelling.  Gastrointestinal: Positive for abdominal pain and nausea.  Endocrine: Negative.   Musculoskeletal: Positive for arthralgias, gait problem and myalgias.  Skin: Negative.   Allergic/Immunologic: Negative.   Neurological: Positive for tremors, weakness and numbness.  Hematological: Bruises/bleeds easily.  Psychiatric/Behavioral: Positive for dysphoric mood. The patient is  nervous/anxious.   All other systems reviewed and are negative.      Objective:   Physical Exam Constitutional: No distress . Vital signs reviewed. Obese. HENT: Normocephalic. Atraumatic. Eyes: EOMI. No discharge. Cardiovascular: No JVD. Respiratory: Normal effort. GI: Non-distended. Musc: Right BKA with edema and tenderness Neurological: He is alert and oriented Patient is alert and follows full commands.  Motor:  Left lower extremity: Hip flexion, knee extension 5/5, ankle dorsiflexion 4+/5 Right lower extremity: 4+/5 hip flexion, knee extension,  Skin: Right BKA with longitudinal medial and lateral incision with serosanguinous drainage Psychiatric: He has a normal mood and affect. His behavior     Assessment & Plan:  Right-handed male with history of COPD who quit smoking 6 years ago, end-stage renal disease with hemodialysis, diabetes mellitus with peripheral neuropathy, hypertension presents for hospital follow-up after receiving CIR for right BKA.    1.   Decreased functional mobility secondary to right below-knee amputation 12/01/2018, with non-healing s/p I&D on 7/1.   [employer] prosthetics   Cont therapies  Cont follow up with Vascular   Encouraged follow up with provider if wound changes in color, odor, drainage, etc.  2. Pain Management/Chronic pain syndrome:   Chronic back pain-was seeing a pain management center, but stopped going.  Cont desensitization techniques  Lidoderm patches ineffective             Lyrica 150 mg 3 times a day per PCP  Elavil 10 was d/ced by patient and does not want to take even though benefits in hospital   3. End-stage renal disease.   Recs per Nephro  4. Diabetes mellitus and peripheral neuropathy. HbA1c 11.2 on 4/16.              Cont meds  Labile at present, encouraged diet compliance  5. Gait abnormality  Cont therapies  Cont wheelchair for safety  6. Sleep disturbance  See #2  7. Tobacco abuse  6 cig/day  Encouraged  abstinence

## 2019-02-26 DIAGNOSIS — Z992 Dependence on renal dialysis: Secondary | ICD-10-CM | POA: Diagnosis not present

## 2019-02-26 DIAGNOSIS — D509 Iron deficiency anemia, unspecified: Secondary | ICD-10-CM | POA: Diagnosis not present

## 2019-02-26 DIAGNOSIS — N186 End stage renal disease: Secondary | ICD-10-CM | POA: Diagnosis not present

## 2019-02-26 DIAGNOSIS — N2581 Secondary hyperparathyroidism of renal origin: Secondary | ICD-10-CM | POA: Diagnosis not present

## 2019-02-26 DIAGNOSIS — D631 Anemia in chronic kidney disease: Secondary | ICD-10-CM | POA: Diagnosis not present

## 2019-02-27 DIAGNOSIS — N186 End stage renal disease: Secondary | ICD-10-CM | POA: Diagnosis not present

## 2019-02-27 DIAGNOSIS — Z4781 Encounter for orthopedic aftercare following surgical amputation: Secondary | ICD-10-CM | POA: Diagnosis not present

## 2019-02-27 DIAGNOSIS — E1151 Type 2 diabetes mellitus with diabetic peripheral angiopathy without gangrene: Secondary | ICD-10-CM | POA: Diagnosis not present

## 2019-02-27 DIAGNOSIS — I120 Hypertensive chronic kidney disease with stage 5 chronic kidney disease or end stage renal disease: Secondary | ICD-10-CM | POA: Diagnosis not present

## 2019-02-27 DIAGNOSIS — E1122 Type 2 diabetes mellitus with diabetic chronic kidney disease: Secondary | ICD-10-CM | POA: Diagnosis not present

## 2019-02-27 DIAGNOSIS — E1142 Type 2 diabetes mellitus with diabetic polyneuropathy: Secondary | ICD-10-CM | POA: Diagnosis not present

## 2019-02-28 DIAGNOSIS — D509 Iron deficiency anemia, unspecified: Secondary | ICD-10-CM | POA: Diagnosis not present

## 2019-02-28 DIAGNOSIS — D631 Anemia in chronic kidney disease: Secondary | ICD-10-CM | POA: Diagnosis not present

## 2019-02-28 DIAGNOSIS — N186 End stage renal disease: Secondary | ICD-10-CM | POA: Diagnosis not present

## 2019-02-28 DIAGNOSIS — Z992 Dependence on renal dialysis: Secondary | ICD-10-CM | POA: Diagnosis not present

## 2019-02-28 DIAGNOSIS — N2581 Secondary hyperparathyroidism of renal origin: Secondary | ICD-10-CM | POA: Diagnosis not present

## 2019-03-02 ENCOUNTER — Ambulatory Visit (INDEPENDENT_AMBULATORY_CARE_PROVIDER_SITE_OTHER): Payer: Medicare Other | Admitting: Family

## 2019-03-02 ENCOUNTER — Encounter: Payer: Self-pay | Admitting: Family

## 2019-03-02 ENCOUNTER — Other Ambulatory Visit: Payer: Self-pay

## 2019-03-02 VITALS — BP 153/63 | HR 72 | Temp 97.9°F | Resp 14 | Ht 76.0 in | Wt 250.0 lb

## 2019-03-02 DIAGNOSIS — F17200 Nicotine dependence, unspecified, uncomplicated: Secondary | ICD-10-CM

## 2019-03-02 DIAGNOSIS — Z89511 Acquired absence of right leg below knee: Secondary | ICD-10-CM

## 2019-03-02 DIAGNOSIS — Z992 Dependence on renal dialysis: Secondary | ICD-10-CM

## 2019-03-02 DIAGNOSIS — N186 End stage renal disease: Secondary | ICD-10-CM

## 2019-03-02 DIAGNOSIS — T8789 Other complications of amputation stump: Secondary | ICD-10-CM

## 2019-03-02 MED ORDER — CEPHALEXIN 500 MG PO CAPS: 500.0000 mg | ORAL_CAPSULE | Freq: Two times a day (BID) | ORAL | 0 refills | Status: DC

## 2019-03-02 NOTE — Patient Instructions (Signed)

## 2019-03-02 NOTE — Progress Notes (Signed)
[facility] [place]   CC: Follow up s/p I&D of medial and lateral aspect of right below-knee amputation   History of Present Illness [name] is a 68 y.o. male who is s/p incision and drainage of medial and lateral aspect of right below-knee amputation on 02-11-19 by Dr. Trula Slade.  The medial wound measured 3 x 2 x 1 cm.  The lateral wound measured 2 x 1 x 1 cm, and placement of [name] wound VAC.  Pt states that [name] changes his right BKA wounds once/week, and his wife changes the dressings the other 6 days of the week.  Pt states the dressing changes are with Santyl, and that he has instructed the [facility] nurse and his wife to moisten the dressing with NS prior to removing it.  He reports pain at his left BKA, at the site.   He is also s/p rightbelow-the-knee amputationby [name].(Date:12/01/18).   He returned to the office on 01-23-19 due to some dehiscence of the medial aspect of his below the knee amputation incision. Pain was only local to the area.He denied any purulence or other drainage. He also denied any fevers, chills, nausea/vomiting at that time. There was an area of dehiscence at the medial aspect of below the knee amputation incision with fat necrosis at wound bed and nonviable skin edges Patient was offered return to the operating room for washout and debridement.He however was frustrated having been hospitalized for several weeks and wanted to try everything we can to avoid return to the hospital or the OR Wound was debrided during that visit as much as the patient could tolerate. He was prescribed a two-week course of Keflex and was to follow up in office for wound recheck in 2 weeks Patient was made aware he is at risk for revision versus above-the-knee amputation He was also made aware to return to office sooner if pain worsens, purulence is noted, general appearance of wound worsens,or if he develops systemic infection symptoms.  He  dialyzes T-T-S via left upper arm AVF.  He denies fever or chills.   Diabetic: Yes,  States less than 7.0 A1C  Tobacco use:  smoker, admits to 6 cigarettes/day  Pt meds include: Statin :Yes Betablocker: Yes ASA: No Other anticoagulants/antiplatelets: Plavix   Past Medical History:  Diagnosis Date  . Anemia   . Anxiety   . Arthritis   . Chronic kidney disease   . COPD (chronic obstructive pulmonary disease) ([place])    Pt denies  . Depression   . Diabetes mellitus without complication ([place])   . GERD (gastroesophageal reflux disease)   . H/O hiatal hernia   . Headache(784.0)    Hx: Migraines  . Hypertension   . Neuropathy   . Non-healing wound of amputation stump ([place])    right  . Numbness of toes    toes and feet  . Pneumonia    Hx: of several times  . Renal insufficiency   . Shortness of breath dyspnea   . Type 2 diabetes mellitus ([place])     Social History Social History   Tobacco Use  . Smoking status: Current Every Day Smoker    Years: 10.00    Types: Cigars    Last attempt to quit: 03/13/2012    Years since quitting: 6.9  . Smokeless tobacco: Never Used  . Tobacco comment: smokes 6-8 cigarettes daily  Substance Use Topics  . Alcohol use: No    Alcohol/week: 0.0 standard drinks  .  Drug use: No    Family History Family History  Problem Relation Age of Onset  . Heart disease Father        Heart Disease before age 61    Past Surgical History:  Procedure Laterality Date  . A/V FISTULAGRAM Left 01/16/2019   Procedure: A/V FISTULAGRAM;  Surgeon: Clayton Dutch, MD;  Location: [place] [facility];  Service: Cardiovascular;  Laterality: Left;  . AMPUTATION Right 12/01/2018   Procedure: RIGHT AMPUTATION BELOW KNEE;  Surgeon: Clayton Dutch, MD;  Location: [place];  Service: Vascular;  Laterality: Right;  . AV FISTULA PLACEMENT  2012      left arm   . AV FISTULA PLACEMENT Left 12/18/2012   Procedure: ARTERIOVENOUS (AV) FISTULA CREATION;  Surgeon:  Clayton Mould, MD;  Location: [place];  Service: Vascular;  Laterality: Left;  . COLONOSCOPY  10/26/2011   Procedure: COLONOSCOPY;  Surgeon: Clayton Houston, MD;  [facility];  Service: Endoscopy;  Laterality: N/A;  730  . EMBOLECTOMY Left 12/09/2012   Procedure: EMBOLECTOMY;  Surgeon: Clayton Mitchell, MD;  Location: [facility] CATH LAB;  Service: Cardiovascular;  Laterality: Left;  left arm venous embolization  . ESOPHAGOGASTRODUODENOSCOPY (EGD) WITH ESOPHAGEAL DILATION N/A 04/23/2013   Procedure: ESOPHAGOGASTRODUODENOSCOPY (EGD) WITH ESOPHAGEAL DILATION;  Surgeon: Clayton Houston, MD;  [facility];  Service: Endoscopy;  Laterality: N/A;  200-moved to 930   . FISTULA SUPERFICIALIZATION Left 06/18/2013   Procedure: FISTULA SUPERFICIALIZATION & LIGATION BRANCH X 1;  Surgeon: [name] Misty, MD;  Location: [place];  Service: Vascular;  Laterality: Left;  . HEMATOMA EVACUATION Right 02/11/2017   Procedure: EVACUATION HEMATOMA RIGHT GROIN, Repair of Right Pseudo-anerysm.;  Surgeon: Clayton Dutch, MD;  [facility];  Service: Vascular;  Laterality: Right;  . INGUINAL HERNIA REPAIR     ,  times   2  . INSERTION OF DIALYSIS CATHETER Left 12/18/2012   Procedure: INSERTION OF DIALYSIS CATHETER;  Surgeon: Clayton Mould, MD;  Location: [place];  Service: Vascular;  Laterality: Left;  . KNEE ARTHROSCOPY  2011   Right Knee  . LOWER EXTREMITY ANGIOGRAPHY N/A 02/11/2017   Procedure: Lower Extremity Angiography;  Surgeon: Clayton Harp, MD;  Location: [place] [facility];  Service: Cardiovascular;  Laterality: N/A;  . PERIPHERAL ATHRECTOMY  02/11/2017  . PERIPHERAL VASCULAR ATHERECTOMY Left 02/11/2017   Procedure: Peripheral Vascular Atherectomy;  Surgeon: Clayton Harp, MD;  Location: [place] [facility];  Service: Cardiovascular;  Laterality: Left;  . PERIPHERAL VASCULAR BALLOON ANGIOPLASTY Left 01/16/2019   Procedure: PERIPHERAL VASCULAR BALLOON ANGIOPLASTY;  Surgeon: Clayton Dutch, MD;  Location: [place] [facility];  Service: Cardiovascular;  Laterality: Left;  central vein  . REVISON OF ARTERIOVENOUS FISTULA Left 9/70/2637   Procedure: PLICATION OF LEFT BRACHIOCEPHALIC ARTERIOVENOUS FISTULA;  Surgeon: Conrad Letcher, MD;  Location: [place];  Service: Vascular;  Laterality: Left;  . SHUNTOGRAM N/A 12/09/2012   Procedure: fistulogram;  Surgeon: Clayton Mitchell, MD;  Location: [facility] CATH LAB;  Service: Cardiovascular;  Laterality: N/A;  . SHUNTOGRAM Left 06/03/2013   Procedure: Fistulogram;  Surgeon: Clayton Mitchell, MD;  Location: [facility] CATH LAB;  Service: Cardiovascular;  Laterality: Left;  . THROMBECTOMY W/ EMBOLECTOMY Left 12/11/2012   Procedure: THROMBECTOMY ARTERIOVENOUS FISTULA;  Surgeon: Clayton Mitchell, MD;  Location: [place];  Service: Vascular;  Laterality: Left;  . TONSILLECTOMY    . WOUND DEBRIDEMENT Right 02/11/2019   Procedure: DEBRIDEMENT WOUND  RIGHT BELOW THE KNEE STUMP;  Surgeon: Clayton Mitchell, MD;  Location: [facility] OR;  Service: Vascular;  Laterality: Right;    No Known Allergies  Current Outpatient Medications  Medication Sig Dispense Refill  . ARTIFICIAL TEAR OP Place 1 drop into both eyes every 6 (six) hours as needed (dry eyes).    Marland Kitchen atorvastatin (LIPITOR) 80 MG tablet Take 1 tablet (80 mg total) by mouth at bedtime. 30 tablet 5  . calcium acetate (PHOSLO) 667 MG capsule Take 4 capsules (2,668 mg total) by mouth 3 (three) times daily with meals. 360 capsule 0  . cinacalcet (SENSIPAR) 30 MG tablet Take 1 tablet (30 mg total) by mouth every evening. 60 tablet 0  . clopidogrel (PLAVIX) 75 MG tablet Take 1 tablet (75 mg total) by mouth daily. 30 tablet 0  . levothyroxine (SYNTHROID) 125 MCG tablet Take 1 tablet (125 mcg total) by mouth daily before breakfast. 30 tablet 0  . lisinopril (ZESTRIL) 10 MG tablet Take 10 mg by mouth daily.    . pregabalin (LYRICA) 150 MG capsule Take 1 capsule (150 mg total) by mouth 3 (three) times daily. 90 capsule 0  .  risperiDONE (RISPERDAL) 0.25 MG tablet Take 1 tablet (0.25 mg total) by mouth daily. 30 tablet 0  . TOUJEO SOLOSTAR 300 UNIT/ML SOPN Inject 54 Units as directed daily.     No current facility-administered medications for this visit.     ROS: See HPI for pertinent positives and negatives.   Physical Examination  Vitals:   03/02/19 1213  BP: (!) 153/63  Pulse: 72  Resp: 14  Temp: 97.9 F (36.6 C)  TempSrc: Temporal  SpO2: 97%  Weight: 250 lb (113.4 kg)  Height: 6\' 4"  (1.93 m)   Body mass index is 30.43 kg/m.  General: A&O x 3, WDWN, obese male. Gait: seated in w/c HEENT: No gross abnormalities.  Pulmonary: Respirations are non labored, CTAB, fair air movement in all fields Cardiac: regular rhythm, no detected murmur.                        VASCULAR EXAM: Extremities with ischemic changes: right BKA skin flap is slightly red and dusky, without Gangrene; with open wounds. See photos below    Lateral right BKA wound    Medial right BKA wound                                                                                                           LE Pulses Right Left       FEMORAL  not palpable seated, obese  not palpable        POPLITEAL  not palpable   not palpable       POSTERIOR TIBIAL  BKA   not palpable        DORSALIS PEDIS      ANTERIOR TIBIAL  BKA  not palpable    Abdomen: soft, NT, no palpable masses. Skin: no rashes, no cellulitis, no ulcers noted. Musculoskeletal: no muscle wasting or atrophy.  Neurologic: A&O X 3; appropriate affect, Sensation is normal; MOTOR FUNCTION:  moving all extremities equally, motor strength 5/5 throughout. Speech is fluent/normal. CN 2-12 intact. Psychiatric: Thought content is negative, mood negative    ASSESSMENT: KOLBIE LEPKOWSKI is a 68 y.o. male who is s/p Incision and drainage of medial and lateral aspect of right below-knee amputation on 02-11-19 by Dr. Trula Slade.  The medial wound measured 3 x 2 x 1 cm.  The lateral  wound measured 2 x 1 x 1 cm, and placement of [name] wound VAC.  Pt instructed his [place] and his wife to apply NS to the dressing before it is removed, defeating the debriding effort. He states it hurts too much otherwise. I discussed with him that his wound flap is not looking as good as I would have hoped, and that the wounds are not being debrided adequately if the dressing is made wet before removing.   I spoke with pt wife Hilda Blades by phone during the encounter, she knows to continue daily Santyl dressing changes, and to not wet the dressing before removing it, in order to have the benefit of some debridement.   Prescribed Keflex 500 mg po bid x 10 days, disp #20, 0 refills; although since the flap does not seem very well perfused, may not be getting to tissue. The BKA wounds do not seem infected, but the skin flap is slightly red and dusky.   Return in 2 days, Dr. Trula Slade is not here this week nor next week, [name] is not here this week, for wound check, will discuss with Dr. Scot Dock who is here that day, no surgeon is in the office today.  However, pt states he wants to be scheduled with one of our surgeons on his return.  Will hold off on suture removal until then. Continue Plavix.     He has diabetes mellitus and continues to smoke. I discussed with him that tobacco use impedes wound healing and asked him to consider quitting as soon as possible to allow his right BKA wounds a better chance to heal. He did not seem receptive.    PLAN:  Return in 2 days to see me, with Dr. Scot Dock to evaluate also. However, pt refused to see me, he wants to see a doctor. Will schedule him to return at Dr. Trula Slade or [name] soonest available.   I discussed with the patient, in as much depth as he seemed tolerant of, re the nature of atherosclerosis, and emphasized the importance of maximal medical management including strict control of blood pressure, blood glucose, and lipid levels, obtaining  regular exercise, and cessation of smoking.  The patient is aware that without maximal medical management the underlying atherosclerotic disease process will progress, limiting the benefit of any interventions.  The patient was given information about PAD including signs, symptoms, treatment, what symptoms should prompt the patient to seek immediate medical care, and risk reduction measures to take.  Clemon Chambers, RN, MSN, FNP-C [facility] Phone: [phone]  Clinic MD: Donzetta Matters on call  03/02/19 12:30 PM

## 2019-03-03 DIAGNOSIS — N186 End stage renal disease: Secondary | ICD-10-CM | POA: Diagnosis not present

## 2019-03-03 DIAGNOSIS — N2581 Secondary hyperparathyroidism of renal origin: Secondary | ICD-10-CM | POA: Diagnosis not present

## 2019-03-03 DIAGNOSIS — D509 Iron deficiency anemia, unspecified: Secondary | ICD-10-CM | POA: Diagnosis not present

## 2019-03-03 DIAGNOSIS — D631 Anemia in chronic kidney disease: Secondary | ICD-10-CM | POA: Diagnosis not present

## 2019-03-03 DIAGNOSIS — Z992 Dependence on renal dialysis: Secondary | ICD-10-CM | POA: Diagnosis not present

## 2019-03-04 ENCOUNTER — Ambulatory Visit: Payer: Medicare Other | Admitting: Family

## 2019-03-04 DIAGNOSIS — E1142 Type 2 diabetes mellitus with diabetic polyneuropathy: Secondary | ICD-10-CM | POA: Diagnosis not present

## 2019-03-04 DIAGNOSIS — Z4781 Encounter for orthopedic aftercare following surgical amputation: Secondary | ICD-10-CM | POA: Diagnosis not present

## 2019-03-04 DIAGNOSIS — I120 Hypertensive chronic kidney disease with stage 5 chronic kidney disease or end stage renal disease: Secondary | ICD-10-CM | POA: Diagnosis not present

## 2019-03-04 DIAGNOSIS — N186 End stage renal disease: Secondary | ICD-10-CM | POA: Diagnosis not present

## 2019-03-04 DIAGNOSIS — E1151 Type 2 diabetes mellitus with diabetic peripheral angiopathy without gangrene: Secondary | ICD-10-CM | POA: Diagnosis not present

## 2019-03-04 DIAGNOSIS — E1122 Type 2 diabetes mellitus with diabetic chronic kidney disease: Secondary | ICD-10-CM | POA: Diagnosis not present

## 2019-03-05 DIAGNOSIS — D509 Iron deficiency anemia, unspecified: Secondary | ICD-10-CM | POA: Diagnosis not present

## 2019-03-05 DIAGNOSIS — N186 End stage renal disease: Secondary | ICD-10-CM | POA: Diagnosis not present

## 2019-03-05 DIAGNOSIS — N2581 Secondary hyperparathyroidism of renal origin: Secondary | ICD-10-CM | POA: Diagnosis not present

## 2019-03-05 DIAGNOSIS — Z992 Dependence on renal dialysis: Secondary | ICD-10-CM | POA: Diagnosis not present

## 2019-03-05 DIAGNOSIS — D631 Anemia in chronic kidney disease: Secondary | ICD-10-CM | POA: Diagnosis not present

## 2019-03-07 DIAGNOSIS — N2581 Secondary hyperparathyroidism of renal origin: Secondary | ICD-10-CM | POA: Diagnosis not present

## 2019-03-07 DIAGNOSIS — N186 End stage renal disease: Secondary | ICD-10-CM | POA: Diagnosis not present

## 2019-03-07 DIAGNOSIS — D631 Anemia in chronic kidney disease: Secondary | ICD-10-CM | POA: Diagnosis not present

## 2019-03-07 DIAGNOSIS — D509 Iron deficiency anemia, unspecified: Secondary | ICD-10-CM | POA: Diagnosis not present

## 2019-03-07 DIAGNOSIS — Z992 Dependence on renal dialysis: Secondary | ICD-10-CM | POA: Diagnosis not present

## 2019-03-09 DIAGNOSIS — I120 Hypertensive chronic kidney disease with stage 5 chronic kidney disease or end stage renal disease: Secondary | ICD-10-CM | POA: Diagnosis not present

## 2019-03-09 DIAGNOSIS — N186 End stage renal disease: Secondary | ICD-10-CM | POA: Diagnosis not present

## 2019-03-09 DIAGNOSIS — E1151 Type 2 diabetes mellitus with diabetic peripheral angiopathy without gangrene: Secondary | ICD-10-CM | POA: Diagnosis not present

## 2019-03-09 DIAGNOSIS — Z4781 Encounter for orthopedic aftercare following surgical amputation: Secondary | ICD-10-CM | POA: Diagnosis not present

## 2019-03-09 DIAGNOSIS — E1122 Type 2 diabetes mellitus with diabetic chronic kidney disease: Secondary | ICD-10-CM | POA: Diagnosis not present

## 2019-03-09 DIAGNOSIS — E1142 Type 2 diabetes mellitus with diabetic polyneuropathy: Secondary | ICD-10-CM | POA: Diagnosis not present

## 2019-03-10 ENCOUNTER — Telehealth: Payer: Self-pay | Admitting: Vascular Surgery

## 2019-03-10 DIAGNOSIS — D631 Anemia in chronic kidney disease: Secondary | ICD-10-CM | POA: Diagnosis not present

## 2019-03-10 DIAGNOSIS — N186 End stage renal disease: Secondary | ICD-10-CM | POA: Diagnosis not present

## 2019-03-10 DIAGNOSIS — Z992 Dependence on renal dialysis: Secondary | ICD-10-CM | POA: Diagnosis not present

## 2019-03-10 DIAGNOSIS — D509 Iron deficiency anemia, unspecified: Secondary | ICD-10-CM | POA: Diagnosis not present

## 2019-03-10 DIAGNOSIS — N2581 Secondary hyperparathyroidism of renal origin: Secondary | ICD-10-CM | POA: Diagnosis not present

## 2019-03-10 NOTE — Telephone Encounter (Signed)
[name], Nurse with [facility] called.  She is very concerned about how the patient's wound looks on both sides of the amputation site.  She says it looks worse each time she sees it.    The patient is caring for his own wounds and using Santyl on the sutures.   She states the patient has a tendency to "do things his own way" so it has been difficult instructing him on proper wound care.    I advised her that per Vinnie Level Nickel, NP's note, patient was advised to come in 2 days after his appointment with her to also be looked at by Dr. Scot Dock.  The patient refused.    Patient has appointment with Dr. [name] on 03/12/2019.  [name] verbalized understanding.  Thomasa Heidler E., LPN.

## 2019-03-11 DIAGNOSIS — E1122 Type 2 diabetes mellitus with diabetic chronic kidney disease: Secondary | ICD-10-CM | POA: Diagnosis not present

## 2019-03-11 DIAGNOSIS — E1151 Type 2 diabetes mellitus with diabetic peripheral angiopathy without gangrene: Secondary | ICD-10-CM | POA: Diagnosis not present

## 2019-03-11 DIAGNOSIS — N2581 Secondary hyperparathyroidism of renal origin: Secondary | ICD-10-CM | POA: Diagnosis not present

## 2019-03-11 DIAGNOSIS — E1142 Type 2 diabetes mellitus with diabetic polyneuropathy: Secondary | ICD-10-CM | POA: Diagnosis not present

## 2019-03-11 DIAGNOSIS — I120 Hypertensive chronic kidney disease with stage 5 chronic kidney disease or end stage renal disease: Secondary | ICD-10-CM | POA: Diagnosis not present

## 2019-03-11 DIAGNOSIS — Z4781 Encounter for orthopedic aftercare following surgical amputation: Secondary | ICD-10-CM | POA: Diagnosis not present

## 2019-03-11 DIAGNOSIS — Z992 Dependence on renal dialysis: Secondary | ICD-10-CM | POA: Diagnosis not present

## 2019-03-11 DIAGNOSIS — D509 Iron deficiency anemia, unspecified: Secondary | ICD-10-CM | POA: Diagnosis not present

## 2019-03-11 DIAGNOSIS — D631 Anemia in chronic kidney disease: Secondary | ICD-10-CM | POA: Diagnosis not present

## 2019-03-11 DIAGNOSIS — N186 End stage renal disease: Secondary | ICD-10-CM | POA: Diagnosis not present

## 2019-03-12 ENCOUNTER — Other Ambulatory Visit: Payer: Self-pay

## 2019-03-12 ENCOUNTER — Ambulatory Visit (INDEPENDENT_AMBULATORY_CARE_PROVIDER_SITE_OTHER): Payer: Self-pay | Admitting: Vascular Surgery

## 2019-03-12 ENCOUNTER — Encounter: Payer: Self-pay | Admitting: Vascular Surgery

## 2019-03-12 VITALS — BP 108/54 | HR 68 | Temp 97.7°F | Resp 20 | Ht 76.0 in | Wt 250.0 lb

## 2019-03-12 DIAGNOSIS — Z992 Dependence on renal dialysis: Secondary | ICD-10-CM | POA: Diagnosis not present

## 2019-03-12 DIAGNOSIS — D631 Anemia in chronic kidney disease: Secondary | ICD-10-CM | POA: Diagnosis not present

## 2019-03-12 DIAGNOSIS — I739 Peripheral vascular disease, unspecified: Secondary | ICD-10-CM

## 2019-03-12 DIAGNOSIS — D509 Iron deficiency anemia, unspecified: Secondary | ICD-10-CM | POA: Diagnosis not present

## 2019-03-12 DIAGNOSIS — N2581 Secondary hyperparathyroidism of renal origin: Secondary | ICD-10-CM | POA: Diagnosis not present

## 2019-03-12 DIAGNOSIS — N186 End stage renal disease: Secondary | ICD-10-CM | POA: Diagnosis not present

## 2019-03-12 NOTE — Progress Notes (Signed)
Patient is a 68 year old male who returns for follow-up today.  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  He subsequently underwent debridement of this by my partner Dr. Trula Slade on February 11, 2019.  The wound is slowly healing.  He is currently applying Santyl to the edges of the medial and lateral portions of the below-knee amputation.  He has no significant drainage.  He denies fever or chills.  Physical exam:  Vitals:   03/12/19 1401  BP: (!) 108/54  Pulse: 68  Resp: 20  Temp: 97.7 F (36.5 C)  SpO2: 98%  Weight: 250 lb (113.4 kg)  Height: 6\' 4"  (1.93 m)    Right below-knee amputation the anterior aspect is completely healed there are sutures in the lateral and medial corners with skin separation on both these edges.  The wound on the medial aspect is 4 cm x 2 cm x 1 cm depth on the medial side it is similar there is 95% granulation tissue with about 5% fibrinous exudate the flap itself is slightly erythematous and warm but does not appear cellulitic  Assessment: Slowly healing right below-knee amputation.  Still at high risk for failure.  Patient will continue local wound care.  We will see him back in 2 to 3 weeks for recheck.  He will follow-up sooner if he has deterioration of the wound.  Plan: See above  [name], MD [facility] [place] Office: [phone] Pager: [phone]

## 2019-03-13 DIAGNOSIS — Z992 Dependence on renal dialysis: Secondary | ICD-10-CM | POA: Diagnosis not present

## 2019-03-13 DIAGNOSIS — N186 End stage renal disease: Secondary | ICD-10-CM | POA: Diagnosis not present

## 2019-03-14 DIAGNOSIS — N2581 Secondary hyperparathyroidism of renal origin: Secondary | ICD-10-CM | POA: Diagnosis not present

## 2019-03-14 DIAGNOSIS — D509 Iron deficiency anemia, unspecified: Secondary | ICD-10-CM | POA: Diagnosis not present

## 2019-03-14 DIAGNOSIS — D631 Anemia in chronic kidney disease: Secondary | ICD-10-CM | POA: Diagnosis not present

## 2019-03-14 DIAGNOSIS — Z992 Dependence on renal dialysis: Secondary | ICD-10-CM | POA: Diagnosis not present

## 2019-03-14 DIAGNOSIS — N186 End stage renal disease: Secondary | ICD-10-CM | POA: Diagnosis not present

## 2019-03-16 DIAGNOSIS — E1122 Type 2 diabetes mellitus with diabetic chronic kidney disease: Secondary | ICD-10-CM | POA: Diagnosis not present

## 2019-03-16 DIAGNOSIS — I120 Hypertensive chronic kidney disease with stage 5 chronic kidney disease or end stage renal disease: Secondary | ICD-10-CM | POA: Diagnosis not present

## 2019-03-16 DIAGNOSIS — E1142 Type 2 diabetes mellitus with diabetic polyneuropathy: Secondary | ICD-10-CM | POA: Diagnosis not present

## 2019-03-16 DIAGNOSIS — E1151 Type 2 diabetes mellitus with diabetic peripheral angiopathy without gangrene: Secondary | ICD-10-CM | POA: Diagnosis not present

## 2019-03-16 DIAGNOSIS — N186 End stage renal disease: Secondary | ICD-10-CM | POA: Diagnosis not present

## 2019-03-16 DIAGNOSIS — Z4781 Encounter for orthopedic aftercare following surgical amputation: Secondary | ICD-10-CM | POA: Diagnosis not present

## 2019-03-17 DIAGNOSIS — D509 Iron deficiency anemia, unspecified: Secondary | ICD-10-CM | POA: Diagnosis not present

## 2019-03-17 DIAGNOSIS — N2581 Secondary hyperparathyroidism of renal origin: Secondary | ICD-10-CM | POA: Diagnosis not present

## 2019-03-17 DIAGNOSIS — Z992 Dependence on renal dialysis: Secondary | ICD-10-CM | POA: Diagnosis not present

## 2019-03-17 DIAGNOSIS — D631 Anemia in chronic kidney disease: Secondary | ICD-10-CM | POA: Diagnosis not present

## 2019-03-17 DIAGNOSIS — N186 End stage renal disease: Secondary | ICD-10-CM | POA: Diagnosis not present

## 2019-03-18 DIAGNOSIS — E1151 Type 2 diabetes mellitus with diabetic peripheral angiopathy without gangrene: Secondary | ICD-10-CM | POA: Diagnosis not present

## 2019-03-18 DIAGNOSIS — I120 Hypertensive chronic kidney disease with stage 5 chronic kidney disease or end stage renal disease: Secondary | ICD-10-CM | POA: Diagnosis not present

## 2019-03-18 DIAGNOSIS — Z4781 Encounter for orthopedic aftercare following surgical amputation: Secondary | ICD-10-CM | POA: Diagnosis not present

## 2019-03-18 DIAGNOSIS — N186 End stage renal disease: Secondary | ICD-10-CM | POA: Diagnosis not present

## 2019-03-18 DIAGNOSIS — E1122 Type 2 diabetes mellitus with diabetic chronic kidney disease: Secondary | ICD-10-CM | POA: Diagnosis not present

## 2019-03-18 DIAGNOSIS — E1142 Type 2 diabetes mellitus with diabetic polyneuropathy: Secondary | ICD-10-CM | POA: Diagnosis not present

## 2019-03-19 ENCOUNTER — Telehealth: Payer: Self-pay | Admitting: Vascular Surgery

## 2019-03-19 DIAGNOSIS — D631 Anemia in chronic kidney disease: Secondary | ICD-10-CM | POA: Diagnosis not present

## 2019-03-19 DIAGNOSIS — D509 Iron deficiency anemia, unspecified: Secondary | ICD-10-CM | POA: Diagnosis not present

## 2019-03-19 DIAGNOSIS — N2581 Secondary hyperparathyroidism of renal origin: Secondary | ICD-10-CM | POA: Diagnosis not present

## 2019-03-19 DIAGNOSIS — Z992 Dependence on renal dialysis: Secondary | ICD-10-CM | POA: Diagnosis not present

## 2019-03-19 DIAGNOSIS — N186 End stage renal disease: Secondary | ICD-10-CM | POA: Diagnosis not present

## 2019-03-19 NOTE — Telephone Encounter (Signed)
Per Elza Rafter, RN with [facility], patient is not compliant with wound care.  High risk for potentially avoidable events, infection and wound demise due to non-compliance.    If this continues, Home Health will have to discharge pt due to unsafe management of wounds in the community setting.    Pt noted to jerk dressings off wound with ungloved and unwashed hand.   Instructed on proper hand hygiene and wearing gloves when performing wound care.    Pt states he has been applying xeroform gauze or vaseline gauze to left heel wound.  Pt scheduled to see Dr. [name] on 04/02/19 for 2-3 wk follow up.  Thurston Hole., LPN

## 2019-03-20 DIAGNOSIS — F419 Anxiety disorder, unspecified: Secondary | ICD-10-CM | POA: Diagnosis not present

## 2019-03-20 DIAGNOSIS — Z4781 Encounter for orthopedic aftercare following surgical amputation: Secondary | ICD-10-CM | POA: Diagnosis not present

## 2019-03-20 DIAGNOSIS — Z992 Dependence on renal dialysis: Secondary | ICD-10-CM | POA: Diagnosis not present

## 2019-03-20 DIAGNOSIS — E785 Hyperlipidemia, unspecified: Secondary | ICD-10-CM | POA: Diagnosis not present

## 2019-03-20 DIAGNOSIS — I10 Essential (primary) hypertension: Secondary | ICD-10-CM | POA: Diagnosis not present

## 2019-03-20 DIAGNOSIS — E039 Hypothyroidism, unspecified: Secondary | ICD-10-CM | POA: Diagnosis not present

## 2019-03-20 DIAGNOSIS — M1990 Unspecified osteoarthritis, unspecified site: Secondary | ICD-10-CM | POA: Diagnosis not present

## 2019-03-20 DIAGNOSIS — I120 Hypertensive chronic kidney disease with stage 5 chronic kidney disease or end stage renal disease: Secondary | ICD-10-CM | POA: Diagnosis not present

## 2019-03-20 DIAGNOSIS — E162 Hypoglycemia, unspecified: Secondary | ICD-10-CM | POA: Diagnosis not present

## 2019-03-20 DIAGNOSIS — Z794 Long term (current) use of insulin: Secondary | ICD-10-CM | POA: Diagnosis not present

## 2019-03-20 DIAGNOSIS — N186 End stage renal disease: Secondary | ICD-10-CM | POA: Diagnosis not present

## 2019-03-20 DIAGNOSIS — E1151 Type 2 diabetes mellitus with diabetic peripheral angiopathy without gangrene: Secondary | ICD-10-CM | POA: Diagnosis not present

## 2019-03-20 DIAGNOSIS — Z9181 History of falling: Secondary | ICD-10-CM | POA: Diagnosis not present

## 2019-03-20 DIAGNOSIS — E782 Mixed hyperlipidemia: Secondary | ICD-10-CM | POA: Diagnosis not present

## 2019-03-20 DIAGNOSIS — F329 Major depressive disorder, single episode, unspecified: Secondary | ICD-10-CM | POA: Diagnosis not present

## 2019-03-20 DIAGNOSIS — E1122 Type 2 diabetes mellitus with diabetic chronic kidney disease: Secondary | ICD-10-CM | POA: Diagnosis not present

## 2019-03-20 DIAGNOSIS — Z89511 Acquired absence of right leg below knee: Secondary | ICD-10-CM | POA: Diagnosis not present

## 2019-03-20 DIAGNOSIS — R42 Dizziness and giddiness: Secondary | ICD-10-CM | POA: Diagnosis not present

## 2019-03-20 DIAGNOSIS — K210 Gastro-esophageal reflux disease with esophagitis: Secondary | ICD-10-CM | POA: Diagnosis not present

## 2019-03-20 DIAGNOSIS — J449 Chronic obstructive pulmonary disease, unspecified: Secondary | ICD-10-CM | POA: Diagnosis not present

## 2019-03-20 DIAGNOSIS — E1142 Type 2 diabetes mellitus with diabetic polyneuropathy: Secondary | ICD-10-CM | POA: Diagnosis not present

## 2019-03-20 DIAGNOSIS — Z87891 Personal history of nicotine dependence: Secondary | ICD-10-CM | POA: Diagnosis not present

## 2019-03-20 DIAGNOSIS — K219 Gastro-esophageal reflux disease without esophagitis: Secondary | ICD-10-CM | POA: Diagnosis not present

## 2019-03-21 DIAGNOSIS — Z992 Dependence on renal dialysis: Secondary | ICD-10-CM | POA: Diagnosis not present

## 2019-03-21 DIAGNOSIS — D631 Anemia in chronic kidney disease: Secondary | ICD-10-CM | POA: Diagnosis not present

## 2019-03-21 DIAGNOSIS — N2581 Secondary hyperparathyroidism of renal origin: Secondary | ICD-10-CM | POA: Diagnosis not present

## 2019-03-21 DIAGNOSIS — N186 End stage renal disease: Secondary | ICD-10-CM | POA: Diagnosis not present

## 2019-03-21 DIAGNOSIS — D509 Iron deficiency anemia, unspecified: Secondary | ICD-10-CM | POA: Diagnosis not present

## 2019-03-23 DIAGNOSIS — Z4781 Encounter for orthopedic aftercare following surgical amputation: Secondary | ICD-10-CM | POA: Diagnosis not present

## 2019-03-23 DIAGNOSIS — E1122 Type 2 diabetes mellitus with diabetic chronic kidney disease: Secondary | ICD-10-CM | POA: Diagnosis not present

## 2019-03-23 DIAGNOSIS — E1142 Type 2 diabetes mellitus with diabetic polyneuropathy: Secondary | ICD-10-CM | POA: Diagnosis not present

## 2019-03-23 DIAGNOSIS — N186 End stage renal disease: Secondary | ICD-10-CM | POA: Diagnosis not present

## 2019-03-23 DIAGNOSIS — I120 Hypertensive chronic kidney disease with stage 5 chronic kidney disease or end stage renal disease: Secondary | ICD-10-CM | POA: Diagnosis not present

## 2019-03-23 DIAGNOSIS — E1151 Type 2 diabetes mellitus with diabetic peripheral angiopathy without gangrene: Secondary | ICD-10-CM | POA: Diagnosis not present

## 2019-03-24 DIAGNOSIS — D631 Anemia in chronic kidney disease: Secondary | ICD-10-CM | POA: Diagnosis not present

## 2019-03-24 DIAGNOSIS — Z992 Dependence on renal dialysis: Secondary | ICD-10-CM | POA: Diagnosis not present

## 2019-03-24 DIAGNOSIS — N2581 Secondary hyperparathyroidism of renal origin: Secondary | ICD-10-CM | POA: Diagnosis not present

## 2019-03-24 DIAGNOSIS — D509 Iron deficiency anemia, unspecified: Secondary | ICD-10-CM | POA: Diagnosis not present

## 2019-03-24 DIAGNOSIS — N186 End stage renal disease: Secondary | ICD-10-CM | POA: Diagnosis not present

## 2019-03-25 DIAGNOSIS — Z992 Dependence on renal dialysis: Secondary | ICD-10-CM | POA: Diagnosis not present

## 2019-03-25 DIAGNOSIS — I120 Hypertensive chronic kidney disease with stage 5 chronic kidney disease or end stage renal disease: Secondary | ICD-10-CM | POA: Diagnosis not present

## 2019-03-25 DIAGNOSIS — Z1389 Encounter for screening for other disorder: Secondary | ICD-10-CM | POA: Diagnosis not present

## 2019-03-25 DIAGNOSIS — Z1331 Encounter for screening for depression: Secondary | ICD-10-CM | POA: Diagnosis not present

## 2019-03-25 DIAGNOSIS — N186 End stage renal disease: Secondary | ICD-10-CM | POA: Diagnosis not present

## 2019-03-25 DIAGNOSIS — I10 Essential (primary) hypertension: Secondary | ICD-10-CM | POA: Diagnosis not present

## 2019-03-25 DIAGNOSIS — Z4781 Encounter for orthopedic aftercare following surgical amputation: Secondary | ICD-10-CM | POA: Diagnosis not present

## 2019-03-25 DIAGNOSIS — F331 Major depressive disorder, recurrent, moderate: Secondary | ICD-10-CM | POA: Insufficient documentation

## 2019-03-25 DIAGNOSIS — E1122 Type 2 diabetes mellitus with diabetic chronic kidney disease: Secondary | ICD-10-CM | POA: Diagnosis not present

## 2019-03-25 DIAGNOSIS — J449 Chronic obstructive pulmonary disease, unspecified: Secondary | ICD-10-CM | POA: Diagnosis not present

## 2019-03-25 DIAGNOSIS — E1151 Type 2 diabetes mellitus with diabetic peripheral angiopathy without gangrene: Secondary | ICD-10-CM | POA: Diagnosis not present

## 2019-03-25 DIAGNOSIS — E1142 Type 2 diabetes mellitus with diabetic polyneuropathy: Secondary | ICD-10-CM | POA: Diagnosis not present

## 2019-03-26 DIAGNOSIS — N2581 Secondary hyperparathyroidism of renal origin: Secondary | ICD-10-CM | POA: Diagnosis not present

## 2019-03-26 DIAGNOSIS — N186 End stage renal disease: Secondary | ICD-10-CM | POA: Diagnosis not present

## 2019-03-26 DIAGNOSIS — D631 Anemia in chronic kidney disease: Secondary | ICD-10-CM | POA: Diagnosis not present

## 2019-03-26 DIAGNOSIS — D509 Iron deficiency anemia, unspecified: Secondary | ICD-10-CM | POA: Diagnosis not present

## 2019-03-26 DIAGNOSIS — Z992 Dependence on renal dialysis: Secondary | ICD-10-CM | POA: Diagnosis not present

## 2019-03-28 DIAGNOSIS — N186 End stage renal disease: Secondary | ICD-10-CM | POA: Diagnosis not present

## 2019-03-28 DIAGNOSIS — D509 Iron deficiency anemia, unspecified: Secondary | ICD-10-CM | POA: Diagnosis not present

## 2019-03-28 DIAGNOSIS — Z992 Dependence on renal dialysis: Secondary | ICD-10-CM | POA: Diagnosis not present

## 2019-03-28 DIAGNOSIS — D631 Anemia in chronic kidney disease: Secondary | ICD-10-CM | POA: Diagnosis not present

## 2019-03-28 DIAGNOSIS — N2581 Secondary hyperparathyroidism of renal origin: Secondary | ICD-10-CM | POA: Diagnosis not present

## 2019-03-30 DIAGNOSIS — E1151 Type 2 diabetes mellitus with diabetic peripheral angiopathy without gangrene: Secondary | ICD-10-CM | POA: Diagnosis not present

## 2019-03-30 DIAGNOSIS — E1122 Type 2 diabetes mellitus with diabetic chronic kidney disease: Secondary | ICD-10-CM | POA: Diagnosis not present

## 2019-03-30 DIAGNOSIS — E1142 Type 2 diabetes mellitus with diabetic polyneuropathy: Secondary | ICD-10-CM | POA: Diagnosis not present

## 2019-03-30 DIAGNOSIS — Z4781 Encounter for orthopedic aftercare following surgical amputation: Secondary | ICD-10-CM | POA: Diagnosis not present

## 2019-03-30 DIAGNOSIS — N186 End stage renal disease: Secondary | ICD-10-CM | POA: Diagnosis not present

## 2019-03-30 DIAGNOSIS — I120 Hypertensive chronic kidney disease with stage 5 chronic kidney disease or end stage renal disease: Secondary | ICD-10-CM | POA: Diagnosis not present

## 2019-03-31 DIAGNOSIS — D631 Anemia in chronic kidney disease: Secondary | ICD-10-CM | POA: Diagnosis not present

## 2019-03-31 DIAGNOSIS — N2581 Secondary hyperparathyroidism of renal origin: Secondary | ICD-10-CM | POA: Diagnosis not present

## 2019-03-31 DIAGNOSIS — N186 End stage renal disease: Secondary | ICD-10-CM | POA: Diagnosis not present

## 2019-03-31 DIAGNOSIS — Z992 Dependence on renal dialysis: Secondary | ICD-10-CM | POA: Diagnosis not present

## 2019-03-31 DIAGNOSIS — D509 Iron deficiency anemia, unspecified: Secondary | ICD-10-CM | POA: Diagnosis not present

## 2019-04-01 DIAGNOSIS — N186 End stage renal disease: Secondary | ICD-10-CM | POA: Diagnosis not present

## 2019-04-01 DIAGNOSIS — E1122 Type 2 diabetes mellitus with diabetic chronic kidney disease: Secondary | ICD-10-CM | POA: Diagnosis not present

## 2019-04-01 DIAGNOSIS — Z4781 Encounter for orthopedic aftercare following surgical amputation: Secondary | ICD-10-CM | POA: Diagnosis not present

## 2019-04-01 DIAGNOSIS — E1142 Type 2 diabetes mellitus with diabetic polyneuropathy: Secondary | ICD-10-CM | POA: Diagnosis not present

## 2019-04-01 DIAGNOSIS — E1151 Type 2 diabetes mellitus with diabetic peripheral angiopathy without gangrene: Secondary | ICD-10-CM | POA: Diagnosis not present

## 2019-04-01 DIAGNOSIS — I120 Hypertensive chronic kidney disease with stage 5 chronic kidney disease or end stage renal disease: Secondary | ICD-10-CM | POA: Diagnosis not present

## 2019-04-02 ENCOUNTER — Ambulatory Visit (INDEPENDENT_AMBULATORY_CARE_PROVIDER_SITE_OTHER): Payer: Self-pay | Admitting: Vascular Surgery

## 2019-04-02 ENCOUNTER — Other Ambulatory Visit: Payer: Self-pay

## 2019-04-02 ENCOUNTER — Encounter: Payer: Self-pay | Admitting: Vascular Surgery

## 2019-04-02 VITALS — BP 122/65 | HR 68 | Temp 97.6°F | Resp 14 | Ht 76.0 in | Wt 260.0 lb

## 2019-04-02 DIAGNOSIS — I739 Peripheral vascular disease, unspecified: Secondary | ICD-10-CM

## 2019-04-02 DIAGNOSIS — D509 Iron deficiency anemia, unspecified: Secondary | ICD-10-CM | POA: Diagnosis not present

## 2019-04-02 DIAGNOSIS — Z992 Dependence on renal dialysis: Secondary | ICD-10-CM | POA: Diagnosis not present

## 2019-04-02 DIAGNOSIS — N186 End stage renal disease: Secondary | ICD-10-CM | POA: Diagnosis not present

## 2019-04-02 DIAGNOSIS — N2581 Secondary hyperparathyroidism of renal origin: Secondary | ICD-10-CM | POA: Diagnosis not present

## 2019-04-02 DIAGNOSIS — D631 Anemia in chronic kidney disease: Secondary | ICD-10-CM | POA: Diagnosis not present

## 2019-04-02 NOTE — Progress Notes (Signed)
Patient is a 68 year old male who returns for follow-up today.  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  He then had debridement of the amputation February 11, 2019.  He is continuing to do local wound care.  He still has considerable pain in the amputation.  He is not interested in an above-knee amputation currently.  Physical exam:  Vitals:   04/02/19 1454  BP: 122/65  Pulse: 68  Resp: 14  Temp: 97.6 F (36.4 C)  TempSrc: Temporal  SpO2: 97%  Weight: 260 lb (117.9 kg)  Height: 6\' 4"  (1.93 m)    Right below-knee amputation still with open wounds lateral and medial measurements taken today images below         Assessment: Slowly healing right below-knee amputation wounds are slightly smaller today the medial wound is about 10% smaller than previously the lateral wound is about 20% smaller than previously both wounds are clean.  I did discuss with the patient today that I still believe there is a 50-50 chance that this may fail.  He would like to continue local wound care for now to see if we can get things to heal up.  Plan: Patient will continue wet-to-dry dressings right leg.  He will follow-up in 1 month for recheck.  He will follow-up sooner if the wound gets worse.  [name], MD [facility] [place] Office: [phone] Pager: [phone]

## 2019-04-04 DIAGNOSIS — N2581 Secondary hyperparathyroidism of renal origin: Secondary | ICD-10-CM | POA: Diagnosis not present

## 2019-04-04 DIAGNOSIS — Z992 Dependence on renal dialysis: Secondary | ICD-10-CM | POA: Diagnosis not present

## 2019-04-04 DIAGNOSIS — N186 End stage renal disease: Secondary | ICD-10-CM | POA: Diagnosis not present

## 2019-04-04 DIAGNOSIS — D509 Iron deficiency anemia, unspecified: Secondary | ICD-10-CM | POA: Diagnosis not present

## 2019-04-04 DIAGNOSIS — D631 Anemia in chronic kidney disease: Secondary | ICD-10-CM | POA: Diagnosis not present

## 2019-04-06 ENCOUNTER — Telehealth: Payer: Self-pay

## 2019-04-06 DIAGNOSIS — I120 Hypertensive chronic kidney disease with stage 5 chronic kidney disease or end stage renal disease: Secondary | ICD-10-CM | POA: Diagnosis not present

## 2019-04-06 DIAGNOSIS — E1122 Type 2 diabetes mellitus with diabetic chronic kidney disease: Secondary | ICD-10-CM | POA: Diagnosis not present

## 2019-04-06 DIAGNOSIS — E1142 Type 2 diabetes mellitus with diabetic polyneuropathy: Secondary | ICD-10-CM | POA: Diagnosis not present

## 2019-04-06 DIAGNOSIS — N186 End stage renal disease: Secondary | ICD-10-CM | POA: Diagnosis not present

## 2019-04-06 DIAGNOSIS — Z4781 Encounter for orthopedic aftercare following surgical amputation: Secondary | ICD-10-CM | POA: Diagnosis not present

## 2019-04-06 DIAGNOSIS — E1151 Type 2 diabetes mellitus with diabetic peripheral angiopathy without gangrene: Secondary | ICD-10-CM | POA: Diagnosis not present

## 2019-04-06 NOTE — Telephone Encounter (Signed)
Pt called and said that he has 2 areas on his amputation that have a foul odor. No difference really in the way it looks but concerned that he may have an infection.   Called pt and offered him an appt. He wants to see Dr [name] and not mid level. Appt was made and he has been advised to keep the area clean and dry outside of doing his regular dressing changes. He was advised to call back if symptoms got worse.   York Cerise, CMA

## 2019-04-07 DIAGNOSIS — N186 End stage renal disease: Secondary | ICD-10-CM | POA: Diagnosis not present

## 2019-04-07 DIAGNOSIS — D631 Anemia in chronic kidney disease: Secondary | ICD-10-CM | POA: Diagnosis not present

## 2019-04-07 DIAGNOSIS — N2581 Secondary hyperparathyroidism of renal origin: Secondary | ICD-10-CM | POA: Diagnosis not present

## 2019-04-07 DIAGNOSIS — Z992 Dependence on renal dialysis: Secondary | ICD-10-CM | POA: Diagnosis not present

## 2019-04-07 DIAGNOSIS — D509 Iron deficiency anemia, unspecified: Secondary | ICD-10-CM | POA: Diagnosis not present

## 2019-04-08 DIAGNOSIS — E1142 Type 2 diabetes mellitus with diabetic polyneuropathy: Secondary | ICD-10-CM | POA: Diagnosis not present

## 2019-04-08 DIAGNOSIS — Z4781 Encounter for orthopedic aftercare following surgical amputation: Secondary | ICD-10-CM | POA: Diagnosis not present

## 2019-04-08 DIAGNOSIS — E1151 Type 2 diabetes mellitus with diabetic peripheral angiopathy without gangrene: Secondary | ICD-10-CM | POA: Diagnosis not present

## 2019-04-08 DIAGNOSIS — I120 Hypertensive chronic kidney disease with stage 5 chronic kidney disease or end stage renal disease: Secondary | ICD-10-CM | POA: Diagnosis not present

## 2019-04-08 DIAGNOSIS — E1122 Type 2 diabetes mellitus with diabetic chronic kidney disease: Secondary | ICD-10-CM | POA: Diagnosis not present

## 2019-04-08 DIAGNOSIS — N186 End stage renal disease: Secondary | ICD-10-CM | POA: Diagnosis not present

## 2019-04-09 ENCOUNTER — Other Ambulatory Visit: Payer: Self-pay

## 2019-04-09 ENCOUNTER — Other Ambulatory Visit: Payer: Self-pay | Admitting: *Deleted

## 2019-04-09 ENCOUNTER — Other Ambulatory Visit: Payer: Self-pay | Admitting: Vascular Surgery

## 2019-04-09 ENCOUNTER — Encounter: Payer: Self-pay | Admitting: *Deleted

## 2019-04-09 ENCOUNTER — Ambulatory Visit (INDEPENDENT_AMBULATORY_CARE_PROVIDER_SITE_OTHER): Payer: Medicare Other | Admitting: Vascular Surgery

## 2019-04-09 ENCOUNTER — Encounter: Payer: Self-pay | Admitting: Vascular Surgery

## 2019-04-09 VITALS — BP 94/54 | HR 74 | Temp 97.2°F | Resp 20 | Ht 76.0 in | Wt 260.0 lb

## 2019-04-09 DIAGNOSIS — T8789 Other complications of amputation stump: Secondary | ICD-10-CM

## 2019-04-09 DIAGNOSIS — I739 Peripheral vascular disease, unspecified: Secondary | ICD-10-CM

## 2019-04-09 DIAGNOSIS — Z89511 Acquired absence of right leg below knee: Secondary | ICD-10-CM

## 2019-04-09 DIAGNOSIS — F17200 Nicotine dependence, unspecified, uncomplicated: Secondary | ICD-10-CM

## 2019-04-09 DIAGNOSIS — Z992 Dependence on renal dialysis: Secondary | ICD-10-CM | POA: Diagnosis not present

## 2019-04-09 DIAGNOSIS — N2581 Secondary hyperparathyroidism of renal origin: Secondary | ICD-10-CM | POA: Diagnosis not present

## 2019-04-09 DIAGNOSIS — D631 Anemia in chronic kidney disease: Secondary | ICD-10-CM | POA: Diagnosis not present

## 2019-04-09 DIAGNOSIS — D509 Iron deficiency anemia, unspecified: Secondary | ICD-10-CM | POA: Diagnosis not present

## 2019-04-09 DIAGNOSIS — N186 End stage renal disease: Secondary | ICD-10-CM | POA: Diagnosis not present

## 2019-04-09 MED ORDER — CEPHALEXIN 500 MG PO CAPS: 500.0000 mg | ORAL_CAPSULE | Freq: Two times a day (BID) | ORAL | 0 refills | Status: DC

## 2019-04-09 NOTE — Progress Notes (Signed)
Patient is a 68 year old male who returns for follow-up today.  He was last seen April 02, 2019.  He has a poorly healing right below-knee amputation.  The amputation was done in April 2020.  He had debridement of this by my partner Dr. Trula Slade February 11, 2019.  It has continued to fail and healing.  Patient is currently doing normal saline wet-to-dry dressings.  He states that the lateral opening in the wound is becoming progressively deeper and has developed a smell.  He is afebrile.   Past Medical History:  Diagnosis Date  . Anemia   . Anxiety   . Arthritis   . Chronic kidney disease   . COPD (chronic obstructive pulmonary disease) ([place])    Pt denies  . Depression   . Diabetes mellitus without complication ([place])   . GERD (gastroesophageal reflux disease)   . H/O hiatal hernia   . Headache(784.0)    Hx: Migraines  . Hypertension   . Neuropathy   . Non-healing wound of amputation stump ([place])    right  . Numbness of toes    toes and feet  . Pneumonia    Hx: of several times  . Renal insufficiency   . Shortness of breath dyspnea   . Type 2 diabetes mellitus ([place])     Past Surgical History:  Procedure Laterality Date  . A/V FISTULAGRAM Left 01/16/2019   Procedure: A/V FISTULAGRAM;  Surgeon: [name], MD;  Location: [place] [facility];  Service: Cardiovascular;  Laterality: Left;  . AMPUTATION Right 12/01/2018   Procedure: RIGHT AMPUTATION BELOW KNEE;  Surgeon: [name], MD;  Location: [place];  Service: Vascular;  Laterality: Right;  . AV FISTULA PLACEMENT  2012      left arm   . AV FISTULA PLACEMENT Left 12/18/2012   Procedure: ARTERIOVENOUS (AV) FISTULA CREATION;  Surgeon: Angelia Mould, MD;  Location: [place];  Service: Vascular;  Laterality: Left;  . COLONOSCOPY  10/26/2011   Procedure: COLONOSCOPY;  Surgeon: Rogene Houston, MD;  [facility];  Service: Endoscopy;  Laterality: N/A;  730  . EMBOLECTOMY Left 12/09/2012   Procedure: EMBOLECTOMY;   Surgeon: Serafina Mitchell, MD;  Location: [facility] CATH LAB;  Service: Cardiovascular;  Laterality: Left;  left arm venous embolization  . ESOPHAGOGASTRODUODENOSCOPY (EGD) WITH ESOPHAGEAL DILATION N/A 04/23/2013   Procedure: ESOPHAGOGASTRODUODENOSCOPY (EGD) WITH ESOPHAGEAL DILATION;  Surgeon: Rogene Houston, MD;  [facility];  Service: Endoscopy;  Laterality: N/A;  200-moved to 930   . FISTULA SUPERFICIALIZATION Left 06/18/2013   Procedure: FISTULA SUPERFICIALIZATION & LIGATION BRANCH X 1;  Surgeon: Mal Misty, MD;  Location: [place];  Service: Vascular;  Laterality: Left;  . HEMATOMA EVACUATION Right 02/11/2017   Procedure: EVACUATION HEMATOMA RIGHT GROIN, Repair of Right Pseudo-anerysm.;  Surgeon: [name], MD;  [facility];  Service: Vascular;  Laterality: Right;  . INGUINAL HERNIA REPAIR     ,  times   2  . INSERTION OF DIALYSIS CATHETER Left 12/18/2012   Procedure: INSERTION OF DIALYSIS CATHETER;  Surgeon: Angelia Mould, MD;  Location: [place];  Service: Vascular;  Laterality: Left;  . KNEE ARTHROSCOPY  2011   Right Knee  . LOWER EXTREMITY ANGIOGRAPHY N/A 02/11/2017   Procedure: Lower Extremity Angiography;  Surgeon: Lorretta Harp, MD;  Location: [place] [facility];  Service: Cardiovascular;  Laterality: N/A;  . PERIPHERAL ATHRECTOMY  02/11/2017  . PERIPHERAL VASCULAR ATHERECTOMY Left 02/11/2017  Procedure: Peripheral Vascular Atherectomy;  Surgeon: Lorretta Harp, MD;  Location: [place] [facility];  Service: Cardiovascular;  Laterality: Left;  . PERIPHERAL VASCULAR BALLOON ANGIOPLASTY Left 01/16/2019   Procedure: PERIPHERAL VASCULAR BALLOON ANGIOPLASTY;  Surgeon: [name], MD;  Location: [place] [facility];  Service: Cardiovascular;  Laterality: Left;  central vein  . REVISON OF ARTERIOVENOUS FISTULA Left 7/62/8315   Procedure: PLICATION OF LEFT BRACHIOCEPHALIC ARTERIOVENOUS FISTULA;  Surgeon: Conrad , MD;  Location: [place];  Service: Vascular;   Laterality: Left;  . SHUNTOGRAM N/A 12/09/2012   Procedure: fistulogram;  Surgeon: Serafina Mitchell, MD;  Location: [facility] CATH LAB;  Service: Cardiovascular;  Laterality: N/A;  . SHUNTOGRAM Left 06/03/2013   Procedure: Fistulogram;  Surgeon: Serafina Mitchell, MD;  Location: [facility] CATH LAB;  Service: Cardiovascular;  Laterality: Left;  . THROMBECTOMY W/ EMBOLECTOMY Left 12/11/2012   Procedure: THROMBECTOMY ARTERIOVENOUS FISTULA;  Surgeon: Serafina Mitchell, MD;  Location: [place];  Service: Vascular;  Laterality: Left;  . TONSILLECTOMY    . WOUND DEBRIDEMENT Right 02/11/2019   Procedure: DEBRIDEMENT WOUND RIGHT BELOW THE KNEE STUMP;  Surgeon: Serafina Mitchell, MD;  [facility] OR;  Service: Vascular;  Laterality: Right;   Current Outpatient Medications on File Prior to Visit  Medication Sig Dispense Refill  . ARTIFICIAL TEAR OP Place 1 drop into both eyes every 6 (six) hours as needed (dry eyes).    Marland Kitchen atorvastatin (LIPITOR) 80 MG tablet Take 1 tablet (80 mg total) by mouth at bedtime. 30 tablet 5  . calcium acetate (PHOSLO) 667 MG capsule Take 4 capsules (2,668 mg total) by mouth 3 (three) times daily with meals. 360 capsule 0  . cinacalcet (SENSIPAR) 30 MG tablet Take 1 tablet (30 mg total) by mouth every evening. 60 tablet 0  . clopidogrel (PLAVIX) 75 MG tablet Take 1 tablet (75 mg total) by mouth daily. 30 tablet 0  . levothyroxine (SYNTHROID) 125 MCG tablet Take 1 tablet (125 mcg total) by mouth daily before breakfast. 30 tablet 0  . lisinopril (ZESTRIL) 10 MG tablet Take 10 mg by mouth daily.    . pregabalin (LYRICA) 150 MG capsule Take 1 capsule (150 mg total) by mouth 3 (three) times daily. 90 capsule 0  . risperiDONE (RISPERDAL) 0.25 MG tablet Take 1 tablet (0.25 mg total) by mouth daily. 30 tablet 0  . TOUJEO SOLOSTAR 300 UNIT/ML SOPN Inject 54 Units as directed daily.     No current facility-administered medications on file prior to visit.     Social History   Socioeconomic History  . Marital  status: Married    Spouse name: Not on file  . Number of children: Not on file  . Years of education: Not on file  . Highest education level: Not on file  Occupational History  . Not on file  Social Needs  . Financial resource strain: Not on file  . Food insecurity    Worry: Not on file    Inability: Not on file  . Transportation needs    Medical: Not on file    Non-medical: Not on file  Tobacco Use  . Smoking status: Current Every Day Smoker    Years: 10.00    Types: Cigars    Last attempt to quit: 03/13/2012    Years since quitting: 7.0  . Smokeless tobacco: Never Used  . Tobacco comment: smokes 6-8 cigarettes daily  Substance and Sexual Activity  . Alcohol use: No    Alcohol/week:  0.0 standard drinks  . Drug use: No  . Sexual activity: Never  Lifestyle  . Physical activity    Days per week: Not on file    Minutes per session: Not on file  . Stress: Not on file  Relationships  . Social Herbalist on phone: Not on file    Gets together: Not on file    Attends religious service: Not on file    Active member of club or organization: Not on file    Attends meetings of clubs or organizations: Not on file    Relationship status: Not on file  . Intimate partner violence    Fear of current or ex partner: Not on file    Emotionally abused: Not on file    Physically abused: Not on file    Forced sexual activity: Not on file  Other Topics Concern  . Not on file  Social History Narrative  . Not on file    Review of systems: He has no shortness of breath.  He has no chest pain.  Physical exam:  Vitals:   04/09/19 1315  BP: (!) 94/54  Pulse: 74  Resp: 20  Temp: (!) 97.2 F (36.2 C)  SpO2: 97%  Weight: 260 lb (117.9 kg)  Height: 6\' 4"  (1.93 m)    Extremities: Right below-knee amputation with 3 x 3 cm opening laterally and medially with healthy-appearing granulation tissue, sinus tract in the lateral opening no expressible purulent drainage flap of stump  is erythematous but unchanged from prior office visits  Assessment: Poorly healing right below-knee amputation.  I had a very deliberate discussion with the patient and his wife today.  I do not believe that this is going to heal.  I discussed with him that I think the chances of that healing are less than 5%.  I have previously discussed with the patient above-knee amputation.  He has refused this in the past and still is resistant to an above-knee amputation.  He would prefer to try another revision of his right below-knee amputation.  He understands the chances of success of this are limited.  Plan: Patient will be scheduled for an MRI of the right lower extremity to rule out osteomyelitis  He is scheduled for revision of his right below-knee amputation April 27, 2019.  I discussed with him several days possible prior to this but he did not want to flip his dialysis today.  Patient will be started on Keflex 500 mg twice a day starting today.  [name], MD [facility] [place] Office: [phone] Pager: [phone]

## 2019-04-09 NOTE — H&P (View-Only) (Signed)
Patient is a 68 year old male who returns for follow-up today.  He was last seen April 02, 2019.  He has a poorly healing right below-knee amputation.  The amputation was done in April 2020.  He had debridement of this by my partner Dr. Trula Slade February 11, 2019.  It has continued to fail and healing.  Patient is currently doing normal saline wet-to-dry dressings.  He states that the lateral opening in the wound is becoming progressively deeper and has developed a smell.  He is afebrile.   Past Medical History:  Diagnosis Date  . Anemia   . Anxiety   . Arthritis   . Chronic kidney disease   . COPD (chronic obstructive pulmonary disease) ([place])    Pt denies  . Depression   . Diabetes mellitus without complication ([place])   . GERD (gastroesophageal reflux disease)   . H/O hiatal hernia   . Headache(784.0)    Hx: Migraines  . Hypertension   . Neuropathy   . Non-healing wound of amputation stump ([place])    right  . Numbness of toes    toes and feet  . Pneumonia    Hx: of several times  . Renal insufficiency   . Shortness of breath dyspnea   . Type 2 diabetes mellitus ([place])     Past Surgical History:  Procedure Laterality Date  . A/V FISTULAGRAM Left 01/16/2019   Procedure: A/V FISTULAGRAM;  Surgeon: [name], MD;  Location: [place] [facility];  Service: Cardiovascular;  Laterality: Left;  . AMPUTATION Right 12/01/2018   Procedure: RIGHT AMPUTATION BELOW KNEE;  Surgeon: [name], MD;  Location: [place];  Service: Vascular;  Laterality: Right;  . AV FISTULA PLACEMENT  2012      left arm   . AV FISTULA PLACEMENT Left 12/18/2012   Procedure: ARTERIOVENOUS (AV) FISTULA CREATION;  Surgeon: Angelia Mould, MD;  Location: [place];  Service: Vascular;  Laterality: Left;  . COLONOSCOPY  10/26/2011   Procedure: COLONOSCOPY;  Surgeon: Rogene Houston, MD;  [facility];  Service: Endoscopy;  Laterality: N/A;  730  . EMBOLECTOMY Left 12/09/2012   Procedure: EMBOLECTOMY;   Surgeon: Serafina Mitchell, MD;  Location: [facility] CATH LAB;  Service: Cardiovascular;  Laterality: Left;  left arm venous embolization  . ESOPHAGOGASTRODUODENOSCOPY (EGD) WITH ESOPHAGEAL DILATION N/A 04/23/2013   Procedure: ESOPHAGOGASTRODUODENOSCOPY (EGD) WITH ESOPHAGEAL DILATION;  Surgeon: Rogene Houston, MD;  [facility];  Service: Endoscopy;  Laterality: N/A;  200-moved to 930   . FISTULA SUPERFICIALIZATION Left 06/18/2013   Procedure: FISTULA SUPERFICIALIZATION & LIGATION BRANCH X 1;  Surgeon: Mal Misty, MD;  Location: [place];  Service: Vascular;  Laterality: Left;  . HEMATOMA EVACUATION Right 02/11/2017   Procedure: EVACUATION HEMATOMA RIGHT GROIN, Repair of Right Pseudo-anerysm.;  Surgeon: [name], MD;  [facility];  Service: Vascular;  Laterality: Right;  . INGUINAL HERNIA REPAIR     ,  times   2  . INSERTION OF DIALYSIS CATHETER Left 12/18/2012   Procedure: INSERTION OF DIALYSIS CATHETER;  Surgeon: Angelia Mould, MD;  Location: [place];  Service: Vascular;  Laterality: Left;  . KNEE ARTHROSCOPY  2011   Right Knee  . LOWER EXTREMITY ANGIOGRAPHY N/A 02/11/2017   Procedure: Lower Extremity Angiography;  Surgeon: Lorretta Harp, MD;  Location: [place] [facility];  Service: Cardiovascular;  Laterality: N/A;  . PERIPHERAL ATHRECTOMY  02/11/2017  . PERIPHERAL VASCULAR ATHERECTOMY Left 02/11/2017  Procedure: Peripheral Vascular Atherectomy;  Surgeon: Lorretta Harp, MD;  Location: [place] [facility];  Service: Cardiovascular;  Laterality: Left;  . PERIPHERAL VASCULAR BALLOON ANGIOPLASTY Left 01/16/2019   Procedure: PERIPHERAL VASCULAR BALLOON ANGIOPLASTY;  Surgeon: [name], MD;  Location: [place] [facility];  Service: Cardiovascular;  Laterality: Left;  central vein  . REVISON OF ARTERIOVENOUS FISTULA Left 8/67/6195   Procedure: PLICATION OF LEFT BRACHIOCEPHALIC ARTERIOVENOUS FISTULA;  Surgeon: Conrad Lomita, MD;  Location: [place];  Service: Vascular;   Laterality: Left;  . SHUNTOGRAM N/A 12/09/2012   Procedure: fistulogram;  Surgeon: Serafina Mitchell, MD;  Location: [facility] CATH LAB;  Service: Cardiovascular;  Laterality: N/A;  . SHUNTOGRAM Left 06/03/2013   Procedure: Fistulogram;  Surgeon: Serafina Mitchell, MD;  Location: [facility] CATH LAB;  Service: Cardiovascular;  Laterality: Left;  . THROMBECTOMY W/ EMBOLECTOMY Left 12/11/2012   Procedure: THROMBECTOMY ARTERIOVENOUS FISTULA;  Surgeon: Serafina Mitchell, MD;  Location: [place];  Service: Vascular;  Laterality: Left;  . TONSILLECTOMY    . WOUND DEBRIDEMENT Right 02/11/2019   Procedure: DEBRIDEMENT WOUND RIGHT BELOW THE KNEE STUMP;  Surgeon: Serafina Mitchell, MD;  Location: [facility] OR;  Service: Vascular;  Laterality: Right;   Current Outpatient Medications on File Prior to Visit  Medication Sig Dispense Refill  . ARTIFICIAL TEAR OP Place 1 drop into both eyes every 6 (six) hours as needed (dry eyes).    Marland Kitchen atorvastatin (LIPITOR) 80 MG tablet Take 1 tablet (80 mg total) by mouth at bedtime. 30 tablet 5  . calcium acetate (PHOSLO) 667 MG capsule Take 4 capsules (2,668 mg total) by mouth 3 (three) times daily with meals. 360 capsule 0  . cinacalcet (SENSIPAR) 30 MG tablet Take 1 tablet (30 mg total) by mouth every evening. 60 tablet 0  . clopidogrel (PLAVIX) 75 MG tablet Take 1 tablet (75 mg total) by mouth daily. 30 tablet 0  . levothyroxine (SYNTHROID) 125 MCG tablet Take 1 tablet (125 mcg total) by mouth daily before breakfast. 30 tablet 0  . lisinopril (ZESTRIL) 10 MG tablet Take 10 mg by mouth daily.    . pregabalin (LYRICA) 150 MG capsule Take 1 capsule (150 mg total) by mouth 3 (three) times daily. 90 capsule 0  . risperiDONE (RISPERDAL) 0.25 MG tablet Take 1 tablet (0.25 mg total) by mouth daily. 30 tablet 0  . TOUJEO SOLOSTAR 300 UNIT/ML SOPN Inject 54 Units as directed daily.     No current facility-administered medications on file prior to visit.     Social History   Socioeconomic History  . Marital  status: Married    Spouse name: Not on file  . Number of children: Not on file  . Years of education: Not on file  . Highest education level: Not on file  Occupational History  . Not on file  Social Needs  . Financial resource strain: Not on file  . Food insecurity    Worry: Not on file    Inability: Not on file  . Transportation needs    Medical: Not on file    Non-medical: Not on file  Tobacco Use  . Smoking status: Current Every Day Smoker    Years: 10.00    Types: Cigars    Last attempt to quit: 03/13/2012    Years since quitting: 7.0  . Smokeless tobacco: Never Used  . Tobacco comment: smokes 6-8 cigarettes daily  Substance and Sexual Activity  . Alcohol use: No    Alcohol/week:  0.0 standard drinks  . Drug use: No  . Sexual activity: Never  Lifestyle  . Physical activity    Days per week: Not on file    Minutes per session: Not on file  . Stress: Not on file  Relationships  . Social Herbalist on phone: Not on file    Gets together: Not on file    Attends religious service: Not on file    Active member of club or organization: Not on file    Attends meetings of clubs or organizations: Not on file    Relationship status: Not on file  . Intimate partner violence    Fear of current or ex partner: Not on file    Emotionally abused: Not on file    Physically abused: Not on file    Forced sexual activity: Not on file  Other Topics Concern  . Not on file  Social History Narrative  . Not on file    Review of systems: He has no shortness of breath.  He has no chest pain.  Physical exam:  Vitals:   04/09/19 1315  BP: (!) 94/54  Pulse: 74  Resp: 20  Temp: (!) 97.2 F (36.2 C)  SpO2: 97%  Weight: 260 lb (117.9 kg)  Height: 6\' 4"  (1.93 m)    Extremities: Right below-knee amputation with 3 x 3 cm opening laterally and medially with healthy-appearing granulation tissue, sinus tract in the lateral opening no expressible purulent drainage flap of stump  is erythematous but unchanged from prior office visits  Assessment: Poorly healing right below-knee amputation.  I had a very deliberate discussion with the patient and his wife today.  I do not believe that this is going to heal.  I discussed with him that I think the chances of that healing are less than 5%.  I have previously discussed with the patient above-knee amputation.  He has refused this in the past and still is resistant to an above-knee amputation.  He would prefer to try another revision of his right below-knee amputation.  He understands the chances of success of this are limited.  Plan: Patient will be scheduled for an MRI of the right lower extremity to rule out osteomyelitis  He is scheduled for revision of his right below-knee amputation April 27, 2019.  I discussed with him several days possible prior to this but he did not want to flip his dialysis today.  Patient will be started on Keflex 500 mg twice a day starting today.  [name], MD [facility] [place] Office: [phone] Pager: [phone]

## 2019-04-10 DIAGNOSIS — D631 Anemia in chronic kidney disease: Secondary | ICD-10-CM | POA: Diagnosis not present

## 2019-04-10 DIAGNOSIS — D509 Iron deficiency anemia, unspecified: Secondary | ICD-10-CM | POA: Diagnosis not present

## 2019-04-10 DIAGNOSIS — N186 End stage renal disease: Secondary | ICD-10-CM | POA: Diagnosis not present

## 2019-04-10 DIAGNOSIS — Z992 Dependence on renal dialysis: Secondary | ICD-10-CM | POA: Diagnosis not present

## 2019-04-10 DIAGNOSIS — N2581 Secondary hyperparathyroidism of renal origin: Secondary | ICD-10-CM | POA: Diagnosis not present

## 2019-04-11 DIAGNOSIS — N2581 Secondary hyperparathyroidism of renal origin: Secondary | ICD-10-CM | POA: Diagnosis not present

## 2019-04-11 DIAGNOSIS — N186 End stage renal disease: Secondary | ICD-10-CM | POA: Diagnosis not present

## 2019-04-11 DIAGNOSIS — D631 Anemia in chronic kidney disease: Secondary | ICD-10-CM | POA: Diagnosis not present

## 2019-04-11 DIAGNOSIS — Z992 Dependence on renal dialysis: Secondary | ICD-10-CM | POA: Diagnosis not present

## 2019-04-11 DIAGNOSIS — D509 Iron deficiency anemia, unspecified: Secondary | ICD-10-CM | POA: Diagnosis not present

## 2019-04-13 DIAGNOSIS — N186 End stage renal disease: Secondary | ICD-10-CM | POA: Diagnosis not present

## 2019-04-13 DIAGNOSIS — Z992 Dependence on renal dialysis: Secondary | ICD-10-CM | POA: Diagnosis not present

## 2019-04-14 DIAGNOSIS — D509 Iron deficiency anemia, unspecified: Secondary | ICD-10-CM | POA: Diagnosis not present

## 2019-04-14 DIAGNOSIS — D631 Anemia in chronic kidney disease: Secondary | ICD-10-CM | POA: Diagnosis not present

## 2019-04-14 DIAGNOSIS — Z23 Encounter for immunization: Secondary | ICD-10-CM | POA: Diagnosis not present

## 2019-04-14 DIAGNOSIS — N186 End stage renal disease: Secondary | ICD-10-CM | POA: Diagnosis not present

## 2019-04-14 DIAGNOSIS — Z992 Dependence on renal dialysis: Secondary | ICD-10-CM | POA: Diagnosis not present

## 2019-04-14 DIAGNOSIS — N2581 Secondary hyperparathyroidism of renal origin: Secondary | ICD-10-CM | POA: Diagnosis not present

## 2019-04-16 DIAGNOSIS — D509 Iron deficiency anemia, unspecified: Secondary | ICD-10-CM | POA: Diagnosis not present

## 2019-04-16 DIAGNOSIS — Z992 Dependence on renal dialysis: Secondary | ICD-10-CM | POA: Diagnosis not present

## 2019-04-16 DIAGNOSIS — N2581 Secondary hyperparathyroidism of renal origin: Secondary | ICD-10-CM | POA: Diagnosis not present

## 2019-04-16 DIAGNOSIS — Z23 Encounter for immunization: Secondary | ICD-10-CM | POA: Diagnosis not present

## 2019-04-16 DIAGNOSIS — D631 Anemia in chronic kidney disease: Secondary | ICD-10-CM | POA: Diagnosis not present

## 2019-04-16 DIAGNOSIS — N186 End stage renal disease: Secondary | ICD-10-CM | POA: Diagnosis not present

## 2019-04-17 DIAGNOSIS — I120 Hypertensive chronic kidney disease with stage 5 chronic kidney disease or end stage renal disease: Secondary | ICD-10-CM | POA: Diagnosis not present

## 2019-04-17 DIAGNOSIS — N186 End stage renal disease: Secondary | ICD-10-CM | POA: Diagnosis not present

## 2019-04-17 DIAGNOSIS — Z4781 Encounter for orthopedic aftercare following surgical amputation: Secondary | ICD-10-CM | POA: Diagnosis not present

## 2019-04-17 DIAGNOSIS — E1122 Type 2 diabetes mellitus with diabetic chronic kidney disease: Secondary | ICD-10-CM | POA: Diagnosis not present

## 2019-04-17 DIAGNOSIS — E1142 Type 2 diabetes mellitus with diabetic polyneuropathy: Secondary | ICD-10-CM | POA: Diagnosis not present

## 2019-04-17 DIAGNOSIS — E1151 Type 2 diabetes mellitus with diabetic peripheral angiopathy without gangrene: Secondary | ICD-10-CM | POA: Diagnosis not present

## 2019-04-18 DIAGNOSIS — N2581 Secondary hyperparathyroidism of renal origin: Secondary | ICD-10-CM | POA: Diagnosis not present

## 2019-04-18 DIAGNOSIS — D509 Iron deficiency anemia, unspecified: Secondary | ICD-10-CM | POA: Diagnosis not present

## 2019-04-18 DIAGNOSIS — N186 End stage renal disease: Secondary | ICD-10-CM | POA: Diagnosis not present

## 2019-04-18 DIAGNOSIS — Z23 Encounter for immunization: Secondary | ICD-10-CM | POA: Diagnosis not present

## 2019-04-18 DIAGNOSIS — D631 Anemia in chronic kidney disease: Secondary | ICD-10-CM | POA: Diagnosis not present

## 2019-04-18 DIAGNOSIS — Z992 Dependence on renal dialysis: Secondary | ICD-10-CM | POA: Diagnosis not present

## 2019-04-19 DIAGNOSIS — E1122 Type 2 diabetes mellitus with diabetic chronic kidney disease: Secondary | ICD-10-CM | POA: Diagnosis not present

## 2019-04-19 DIAGNOSIS — M1990 Unspecified osteoarthritis, unspecified site: Secondary | ICD-10-CM | POA: Diagnosis not present

## 2019-04-19 DIAGNOSIS — Z9181 History of falling: Secondary | ICD-10-CM | POA: Diagnosis not present

## 2019-04-19 DIAGNOSIS — F419 Anxiety disorder, unspecified: Secondary | ICD-10-CM | POA: Diagnosis not present

## 2019-04-19 DIAGNOSIS — K219 Gastro-esophageal reflux disease without esophagitis: Secondary | ICD-10-CM | POA: Diagnosis not present

## 2019-04-19 DIAGNOSIS — F329 Major depressive disorder, single episode, unspecified: Secondary | ICD-10-CM | POA: Diagnosis not present

## 2019-04-19 DIAGNOSIS — Z89511 Acquired absence of right leg below knee: Secondary | ICD-10-CM | POA: Diagnosis not present

## 2019-04-19 DIAGNOSIS — Z794 Long term (current) use of insulin: Secondary | ICD-10-CM | POA: Diagnosis not present

## 2019-04-19 DIAGNOSIS — L89622 Pressure ulcer of left heel, stage 2: Secondary | ICD-10-CM | POA: Diagnosis not present

## 2019-04-19 DIAGNOSIS — J449 Chronic obstructive pulmonary disease, unspecified: Secondary | ICD-10-CM | POA: Diagnosis not present

## 2019-04-19 DIAGNOSIS — Z4781 Encounter for orthopedic aftercare following surgical amputation: Secondary | ICD-10-CM | POA: Diagnosis not present

## 2019-04-19 DIAGNOSIS — E1151 Type 2 diabetes mellitus with diabetic peripheral angiopathy without gangrene: Secondary | ICD-10-CM | POA: Diagnosis not present

## 2019-04-19 DIAGNOSIS — E785 Hyperlipidemia, unspecified: Secondary | ICD-10-CM | POA: Diagnosis not present

## 2019-04-19 DIAGNOSIS — G43909 Migraine, unspecified, not intractable, without status migrainosus: Secondary | ICD-10-CM | POA: Diagnosis not present

## 2019-04-19 DIAGNOSIS — E1142 Type 2 diabetes mellitus with diabetic polyneuropathy: Secondary | ICD-10-CM | POA: Diagnosis not present

## 2019-04-19 DIAGNOSIS — I120 Hypertensive chronic kidney disease with stage 5 chronic kidney disease or end stage renal disease: Secondary | ICD-10-CM | POA: Diagnosis not present

## 2019-04-19 DIAGNOSIS — Z87891 Personal history of nicotine dependence: Secondary | ICD-10-CM | POA: Diagnosis not present

## 2019-04-19 DIAGNOSIS — N186 End stage renal disease: Secondary | ICD-10-CM | POA: Diagnosis not present

## 2019-04-19 DIAGNOSIS — E039 Hypothyroidism, unspecified: Secondary | ICD-10-CM | POA: Diagnosis not present

## 2019-04-19 DIAGNOSIS — Z992 Dependence on renal dialysis: Secondary | ICD-10-CM | POA: Diagnosis not present

## 2019-04-21 DIAGNOSIS — N186 End stage renal disease: Secondary | ICD-10-CM | POA: Diagnosis not present

## 2019-04-21 DIAGNOSIS — N2581 Secondary hyperparathyroidism of renal origin: Secondary | ICD-10-CM | POA: Diagnosis not present

## 2019-04-21 DIAGNOSIS — D631 Anemia in chronic kidney disease: Secondary | ICD-10-CM | POA: Diagnosis not present

## 2019-04-21 DIAGNOSIS — Z23 Encounter for immunization: Secondary | ICD-10-CM | POA: Diagnosis not present

## 2019-04-21 DIAGNOSIS — Z992 Dependence on renal dialysis: Secondary | ICD-10-CM | POA: Diagnosis not present

## 2019-04-21 DIAGNOSIS — D509 Iron deficiency anemia, unspecified: Secondary | ICD-10-CM | POA: Diagnosis not present

## 2019-04-22 ENCOUNTER — Other Ambulatory Visit: Payer: Self-pay

## 2019-04-22 ENCOUNTER — Ambulatory Visit (HOSPITAL_COMMUNITY)
Admission: RE | Admit: 2019-04-22 | Discharge: 2019-04-22 | Disposition: A | Discharge status: Home / Self Care | Payer: Medicare Other | Source: Ambulatory Visit | Attending: Vascular Surgery | Admitting: Vascular Surgery

## 2019-04-22 DIAGNOSIS — I739 Peripheral vascular disease, unspecified: Secondary | ICD-10-CM

## 2019-04-22 DIAGNOSIS — T8789 Other complications of amputation stump: Secondary | ICD-10-CM

## 2019-04-22 DIAGNOSIS — F17200 Nicotine dependence, unspecified, uncomplicated: Secondary | ICD-10-CM | POA: Insufficient documentation

## 2019-04-22 DIAGNOSIS — L97911 Non-pressure chronic ulcer of unspecified part of right lower leg limited to breakdown of skin: Secondary | ICD-10-CM | POA: Diagnosis not present

## 2019-04-22 DIAGNOSIS — Z89511 Acquired absence of right leg below knee: Secondary | ICD-10-CM

## 2019-04-23 DIAGNOSIS — Z23 Encounter for immunization: Secondary | ICD-10-CM | POA: Diagnosis not present

## 2019-04-23 DIAGNOSIS — N2581 Secondary hyperparathyroidism of renal origin: Secondary | ICD-10-CM | POA: Diagnosis not present

## 2019-04-23 DIAGNOSIS — D631 Anemia in chronic kidney disease: Secondary | ICD-10-CM | POA: Diagnosis not present

## 2019-04-23 DIAGNOSIS — N186 End stage renal disease: Secondary | ICD-10-CM | POA: Diagnosis not present

## 2019-04-23 DIAGNOSIS — D509 Iron deficiency anemia, unspecified: Secondary | ICD-10-CM | POA: Diagnosis not present

## 2019-04-23 DIAGNOSIS — Z992 Dependence on renal dialysis: Secondary | ICD-10-CM | POA: Diagnosis not present

## 2019-04-24 ENCOUNTER — Other Ambulatory Visit (HOSPITAL_COMMUNITY)
Admission: RE | Admit: 2019-04-24 | Discharge: 2019-04-24 | Disposition: A | Discharge status: Home / Self Care | Payer: Medicare Other | Source: Ambulatory Visit | Attending: Vascular Surgery | Admitting: Vascular Surgery

## 2019-04-24 ENCOUNTER — Encounter (HOSPITAL_COMMUNITY): Payer: Self-pay | Admitting: *Deleted

## 2019-04-24 ENCOUNTER — Other Ambulatory Visit: Payer: Self-pay

## 2019-04-24 DIAGNOSIS — Z01812 Encounter for preprocedural laboratory examination: Secondary | ICD-10-CM | POA: Insufficient documentation

## 2019-04-24 DIAGNOSIS — Z20828 Contact with and (suspected) exposure to other viral communicable diseases: Secondary | ICD-10-CM | POA: Insufficient documentation

## 2019-04-24 LAB — SARS CORONAVIRUS 2 (TAT 6-24 HRS): SARS Coronavirus 2: NEGATIVE

## 2019-04-24 NOTE — Progress Notes (Addendum)
Spoke with pt for pre-op call. Pt denies cardiac history. Pt is on Plavix, was instructed by Dr. [name]' office to hold 3 days prior to surgery, pt states that he had run out of the Plavix around 04/14/19 and didn't get it refilled until 9/4 but decided just not to take it since he was having surgery. Pt is a type 2 diabetic. Last A1C was 6.5 on 04/09/19. Pt states his fasting blood sugar this week has been between 56-141. Instructed pt to take 1/2 of her regular dose of [place]je[place] (that he has a sliding scale for greater than 130 blood sugar) Sunday PM. Instructed pt to check his blood sugar Monday AM when he gets up and every 2 hours until he leaves for the hospital. If blood sugar is 70 or below, treat with 1/2 cup of clear juice (apple or cranberry) and recheck blood sugar 15 minutes after drinking juice. If blood sugar continues to be 70 or below, call the Short Stay department and ask to speak to a nurse. Pt voiced understanding   Pt had his Covid test done today. Pt states he in quarantine.   Informed pt of visitation policy and voiced understanding.

## 2019-04-25 DIAGNOSIS — D509 Iron deficiency anemia, unspecified: Secondary | ICD-10-CM | POA: Diagnosis not present

## 2019-04-25 DIAGNOSIS — D631 Anemia in chronic kidney disease: Secondary | ICD-10-CM | POA: Diagnosis not present

## 2019-04-25 DIAGNOSIS — I120 Hypertensive chronic kidney disease with stage 5 chronic kidney disease or end stage renal disease: Secondary | ICD-10-CM | POA: Diagnosis not present

## 2019-04-25 DIAGNOSIS — Z23 Encounter for immunization: Secondary | ICD-10-CM | POA: Diagnosis not present

## 2019-04-25 DIAGNOSIS — L89622 Pressure ulcer of left heel, stage 2: Secondary | ICD-10-CM | POA: Diagnosis not present

## 2019-04-25 DIAGNOSIS — Z992 Dependence on renal dialysis: Secondary | ICD-10-CM | POA: Diagnosis not present

## 2019-04-25 DIAGNOSIS — N186 End stage renal disease: Secondary | ICD-10-CM | POA: Diagnosis not present

## 2019-04-25 DIAGNOSIS — N2581 Secondary hyperparathyroidism of renal origin: Secondary | ICD-10-CM | POA: Diagnosis not present

## 2019-04-25 DIAGNOSIS — Z4781 Encounter for orthopedic aftercare following surgical amputation: Secondary | ICD-10-CM | POA: Diagnosis not present

## 2019-04-25 DIAGNOSIS — E1122 Type 2 diabetes mellitus with diabetic chronic kidney disease: Secondary | ICD-10-CM | POA: Diagnosis not present

## 2019-04-25 DIAGNOSIS — E1151 Type 2 diabetes mellitus with diabetic peripheral angiopathy without gangrene: Secondary | ICD-10-CM | POA: Diagnosis not present

## 2019-04-26 ENCOUNTER — Other Ambulatory Visit: Payer: Medicare Other

## 2019-04-27 ENCOUNTER — Encounter (HOSPITAL_COMMUNITY): Payer: Self-pay

## 2019-04-27 ENCOUNTER — Inpatient Hospital Stay (HOSPITAL_COMMUNITY): Payer: Medicare Other | Admitting: Anesthesiology

## 2019-04-27 ENCOUNTER — Other Ambulatory Visit: Payer: Self-pay

## 2019-04-27 ENCOUNTER — Inpatient Hospital Stay (HOSPITAL_COMMUNITY)
Admission: RE | Admit: 2019-04-27 | Discharge: 2019-05-02 | DRG: 474 | Disposition: A | Discharge status: Home Health | Payer: Medicare Other | Attending: Vascular Surgery | Admitting: Vascular Surgery

## 2019-04-27 ENCOUNTER — Encounter (HOSPITAL_COMMUNITY)
Admission: RE | Disposition: A | Discharge status: Home Health | Payer: Self-pay | Source: Home / Self Care | Attending: Vascular Surgery

## 2019-04-27 DIAGNOSIS — T8789 Other complications of amputation stump: Secondary | ICD-10-CM | POA: Diagnosis not present

## 2019-04-27 DIAGNOSIS — L97519 Non-pressure chronic ulcer of other part of right foot with unspecified severity: Secondary | ICD-10-CM | POA: Diagnosis not present

## 2019-04-27 DIAGNOSIS — E1151 Type 2 diabetes mellitus with diabetic peripheral angiopathy without gangrene: Secondary | ICD-10-CM | POA: Diagnosis present

## 2019-04-27 DIAGNOSIS — K449 Diaphragmatic hernia without obstruction or gangrene: Secondary | ICD-10-CM | POA: Diagnosis present

## 2019-04-27 DIAGNOSIS — I739 Peripheral vascular disease, unspecified: Secondary | ICD-10-CM | POA: Diagnosis present

## 2019-04-27 DIAGNOSIS — F17210 Nicotine dependence, cigarettes, uncomplicated: Secondary | ICD-10-CM | POA: Diagnosis present

## 2019-04-27 DIAGNOSIS — Y835 Amputation of limb(s) as the cause of abnormal reaction of the patient, or of later complication, without mention of misadventure at the time of the procedure: Secondary | ICD-10-CM | POA: Diagnosis present

## 2019-04-27 DIAGNOSIS — F329 Major depressive disorder, single episode, unspecified: Secondary | ICD-10-CM | POA: Diagnosis not present

## 2019-04-27 DIAGNOSIS — Z89511 Acquired absence of right leg below knee: Secondary | ICD-10-CM | POA: Diagnosis not present

## 2019-04-27 DIAGNOSIS — N186 End stage renal disease: Secondary | ICD-10-CM | POA: Diagnosis not present

## 2019-04-27 DIAGNOSIS — T8781 Dehiscence of amputation stump: Secondary | ICD-10-CM | POA: Diagnosis not present

## 2019-04-27 DIAGNOSIS — Z79899 Other long term (current) drug therapy: Secondary | ICD-10-CM | POA: Diagnosis not present

## 2019-04-27 DIAGNOSIS — R4701 Aphasia: Secondary | ICD-10-CM | POA: Diagnosis not present

## 2019-04-27 DIAGNOSIS — E1122 Type 2 diabetes mellitus with diabetic chronic kidney disease: Secondary | ICD-10-CM | POA: Diagnosis not present

## 2019-04-27 DIAGNOSIS — E1142 Type 2 diabetes mellitus with diabetic polyneuropathy: Secondary | ICD-10-CM | POA: Diagnosis present

## 2019-04-27 DIAGNOSIS — Z992 Dependence on renal dialysis: Secondary | ICD-10-CM | POA: Diagnosis not present

## 2019-04-27 DIAGNOSIS — Z79890 Hormone replacement therapy (postmenopausal): Secondary | ICD-10-CM | POA: Diagnosis not present

## 2019-04-27 DIAGNOSIS — I70261 Atherosclerosis of native arteries of extremities with gangrene, right leg: Secondary | ICD-10-CM | POA: Diagnosis not present

## 2019-04-27 DIAGNOSIS — Z794 Long term (current) use of insulin: Secondary | ICD-10-CM | POA: Diagnosis not present

## 2019-04-27 DIAGNOSIS — Z7902 Long term (current) use of antithrombotics/antiplatelets: Secondary | ICD-10-CM | POA: Diagnosis not present

## 2019-04-27 DIAGNOSIS — D631 Anemia in chronic kidney disease: Secondary | ICD-10-CM | POA: Diagnosis not present

## 2019-04-27 DIAGNOSIS — R278 Other lack of coordination: Secondary | ICD-10-CM | POA: Diagnosis not present

## 2019-04-27 DIAGNOSIS — I120 Hypertensive chronic kidney disease with stage 5 chronic kidney disease or end stage renal disease: Secondary | ICD-10-CM | POA: Diagnosis not present

## 2019-04-27 DIAGNOSIS — F419 Anxiety disorder, unspecified: Secondary | ICD-10-CM | POA: Diagnosis present

## 2019-04-27 DIAGNOSIS — Z20828 Contact with and (suspected) exposure to other viral communicable diseases: Secondary | ICD-10-CM | POA: Diagnosis not present

## 2019-04-27 DIAGNOSIS — K219 Gastro-esophageal reflux disease without esophagitis: Secondary | ICD-10-CM | POA: Diagnosis present

## 2019-04-27 DIAGNOSIS — R4182 Altered mental status, unspecified: Secondary | ICD-10-CM | POA: Diagnosis not present

## 2019-04-27 DIAGNOSIS — J449 Chronic obstructive pulmonary disease, unspecified: Secondary | ICD-10-CM | POA: Diagnosis present

## 2019-04-27 HISTORY — PX: APPLICATION OF WOUND VAC: SHX5189

## 2019-04-27 HISTORY — DX: Personal history of urinary calculi: Z87.442

## 2019-04-27 HISTORY — PX: AMPUTATION: SHX166

## 2019-04-27 LAB — GLUCOSE, CAPILLARY
Glucose-Capillary: 214 mg/dL — ABNORMAL HIGH (ref 70–99)
Glucose-Capillary: 200 mg/dL — ABNORMAL HIGH (ref 70–99)
Glucose-Capillary: 176 mg/dL — ABNORMAL HIGH (ref 70–99)
Glucose-Capillary: 134 mg/dL — ABNORMAL HIGH (ref 70–99)
Glucose-Capillary: 92 mg/dL (ref 70–99)

## 2019-04-27 LAB — CBC
HCT: 36.5 % — ABNORMAL LOW (ref 39.0–52.0)
Hemoglobin: 11.3 g/dL — ABNORMAL LOW (ref 13.0–17.0)
MCH: 31 pg (ref 26.0–34.0)
MCHC: 31 g/dL (ref 30.0–36.0)
MCV: 100 fL (ref 80.0–100.0)
Platelets: 274 10*3/uL (ref 150–400)
RBC: 3.65 MIL/uL — ABNORMAL LOW (ref 4.22–5.81)
RDW: 15.3 % (ref 11.5–15.5)
WBC: 7.8 10*3/uL (ref 4.0–10.5)
nRBC: 0 % (ref 0.0–0.2)

## 2019-04-27 LAB — POCT I-STAT 4, (NA,K, GLUC, HGB,HCT)
Glucose, Bld: 230 mg/dL — ABNORMAL HIGH (ref 70–99)
HCT: 34 % — ABNORMAL LOW (ref 39.0–52.0)
Hemoglobin: 11.6 g/dL — ABNORMAL LOW (ref 13.0–17.0)
Potassium: 5.1 mmol/L (ref 3.5–5.1)
Sodium: 136 mmol/L (ref 135–145)

## 2019-04-27 LAB — SURGICAL PCR SCREEN
MRSA, PCR: NEGATIVE
Staphylococcus aureus: NEGATIVE

## 2019-04-27 SURGERY — AMPUTATION BELOW KNEE
Anesthesia: General | Site: Leg Lower | Laterality: Right

## 2019-04-27 MED ORDER — EPHEDRINE 5 MG/ML INJ
INTRAVENOUS | Status: AC
  Filled 2019-04-27: qty 10

## 2019-04-27 MED ORDER — MIDAZOLAM HCL 2 MG/2ML IJ SOLN
INTRAMUSCULAR | Status: AC
  Filled 2019-04-27: qty 2

## 2019-04-27 MED ORDER — OXYCODONE HCL 5 MG/5ML PO SOLN: 5.0000 mg | Freq: Once | ORAL | Status: AC | PRN

## 2019-04-27 MED ORDER — SODIUM CHLORIDE 0.9 % IV SOLN
INTRAVENOUS | Status: DC | PRN
  Administered 2019-04-27: 25 ug/min via INTRAVENOUS

## 2019-04-27 MED ORDER — LEVOTHYROXINE SODIUM 25 MCG PO TABS
125.0000 ug | ORAL_TABLET | Freq: Every day | ORAL | Status: DC
  Administered 2019-04-28 – 2019-05-02 (×5): 125 ug via ORAL
  Filled 2019-04-27 (×5): qty 1

## 2019-04-27 MED ORDER — POLYETHYLENE GLYCOL 3350 17 G PO PACK: 17.0000 g | PACK | Freq: Every day | ORAL | Status: DC | PRN

## 2019-04-27 MED ORDER — ALUM & MAG HYDROXIDE-SIMETH 200-200-20 MG/5ML PO SUSP: 15.0000 mL | ORAL | Status: DC | PRN

## 2019-04-27 MED ORDER — MELATONIN 3 MG PO TABS
9.0000 mg | ORAL_TABLET | Freq: Every day | ORAL | Status: DC
  Administered 2019-04-27 – 2019-05-01 (×5): 9 mg via ORAL
  Filled 2019-04-27 (×6): qty 3

## 2019-04-27 MED ORDER — KETOTIFEN FUMARATE 0.025 % OP SOLN
1.0000 [drp] | Freq: Two times a day (BID) | OPHTHALMIC | Status: DC
  Administered 2019-04-27 – 2019-05-02 (×9): 1 [drp] via OPHTHALMIC
  Filled 2019-04-27: qty 5

## 2019-04-27 MED ORDER — FENTANYL CITRATE (PF) 100 MCG/2ML IJ SOLN
25.0000 ug | INTRAMUSCULAR | Status: DC | PRN
  Administered 2019-04-27 (×3): 50 ug via INTRAVENOUS

## 2019-04-27 MED ORDER — FENTANYL CITRATE (PF) 100 MCG/2ML IJ SOLN
INTRAMUSCULAR | Status: AC
  Filled 2019-04-27: qty 2

## 2019-04-27 MED ORDER — CLOPIDOGREL BISULFATE 75 MG PO TABS
75.0000 mg | ORAL_TABLET | Freq: Every day | ORAL | Status: DC
  Administered 2019-04-27 – 2019-05-02 (×6): 75 mg via ORAL
  Filled 2019-04-27 (×6): qty 1

## 2019-04-27 MED ORDER — IBUPROFEN 400 MG PO TABS
800.0000 mg | ORAL_TABLET | Freq: Three times a day (TID) | ORAL | Status: DC | PRN
  Administered 2019-04-27: 800 mg via ORAL
  Filled 2019-04-27: qty 1

## 2019-04-27 MED ORDER — POTASSIUM CHLORIDE CRYS ER 20 MEQ PO TBCR
20.0000 meq | EXTENDED_RELEASE_TABLET | Freq: Once | ORAL | Status: DC
  Filled 2019-04-27: qty 2

## 2019-04-27 MED ORDER — PROPOFOL 10 MG/ML IV BOLUS
INTRAVENOUS | Status: DC | PRN
  Administered 2019-04-27: 120 mg via INTRAVENOUS

## 2019-04-27 MED ORDER — CHLORHEXIDINE GLUCONATE 4 % EX LIQD: 60.0000 mL | Freq: Once | CUTANEOUS | Status: DC

## 2019-04-27 MED ORDER — LABETALOL HCL 5 MG/ML IV SOLN: 10.0000 mg | INTRAVENOUS | Status: DC | PRN

## 2019-04-27 MED ORDER — GUAIFENESIN-DM 100-10 MG/5ML PO SYRP: 15.0000 mL | ORAL_SOLUTION | ORAL | Status: DC | PRN

## 2019-04-27 MED ORDER — ATORVASTATIN CALCIUM 80 MG PO TABS
80.0000 mg | ORAL_TABLET | Freq: Every day | ORAL | Status: DC
  Administered 2019-04-27 – 2019-05-01 (×5): 80 mg via ORAL
  Filled 2019-04-27 (×5): qty 1

## 2019-04-27 MED ORDER — CEFAZOLIN SODIUM-DEXTROSE 2-4 GM/100ML-% IV SOLN
2.0000 g | INTRAVENOUS | Status: AC
  Administered 2019-04-27: 2 g via INTRAVENOUS

## 2019-04-27 MED ORDER — DEXAMETHASONE SODIUM PHOSPHATE 10 MG/ML IJ SOLN
INTRAMUSCULAR | Status: AC
  Filled 2019-04-27: qty 1

## 2019-04-27 MED ORDER — 0.9 % SODIUM CHLORIDE (POUR BTL) OPTIME
TOPICAL | Status: DC | PRN
  Administered 2019-04-27: 1000 mL

## 2019-04-27 MED ORDER — PANTOPRAZOLE SODIUM 40 MG PO TBEC
40.0000 mg | DELAYED_RELEASE_TABLET | Freq: Every day | ORAL | Status: DC
  Administered 2019-04-28 – 2019-05-02 (×4): 40 mg via ORAL
  Filled 2019-04-27 (×4): qty 1

## 2019-04-27 MED ORDER — FENTANYL CITRATE (PF) 250 MCG/5ML IJ SOLN
INTRAMUSCULAR | Status: AC
  Filled 2019-04-27: qty 5

## 2019-04-27 MED ORDER — SODIUM CHLORIDE 0.9 % IV SOLN
INTRAVENOUS | Status: DC
  Administered 2019-04-27 (×2): via INTRAVENOUS

## 2019-04-27 MED ORDER — RISPERIDONE 0.25 MG PO TABS
0.2500 mg | ORAL_TABLET | Freq: Every day | ORAL | Status: DC
  Administered 2019-04-27 – 2019-05-01 (×4): 0.25 mg via ORAL
  Filled 2019-04-27 (×6): qty 1

## 2019-04-27 MED ORDER — CINACALCET HCL 30 MG PO TABS
30.0000 mg | ORAL_TABLET | Freq: Every evening | ORAL | Status: DC
  Administered 2019-04-27 – 2019-05-01 (×5): 30 mg via ORAL
  Filled 2019-04-27 (×5): qty 1

## 2019-04-27 MED ORDER — LIDOCAINE 2% (20 MG/ML) 5 ML SYRINGE
INTRAMUSCULAR | Status: DC | PRN
  Administered 2019-04-27: 80 mg via INTRAVENOUS

## 2019-04-27 MED ORDER — FENTANYL CITRATE (PF) 250 MCG/5ML IJ SOLN
INTRAMUSCULAR | Status: DC | PRN
  Administered 2019-04-27 (×4): 25 ug via INTRAVENOUS
  Administered 2019-04-27: 75 ug via INTRAVENOUS
  Administered 2019-04-27: 50 ug via INTRAVENOUS
  Administered 2019-04-27: 25 ug via INTRAVENOUS

## 2019-04-27 MED ORDER — ACETAMINOPHEN 325 MG PO TABS
325.0000 mg | ORAL_TABLET | ORAL | Status: DC | PRN
  Administered 2019-04-28 – 2019-05-01 (×5): 650 mg via ORAL
  Filled 2019-04-27 (×5): qty 2

## 2019-04-27 MED ORDER — SUCCINYLCHOLINE CHLORIDE 200 MG/10ML IV SOSY
PREFILLED_SYRINGE | INTRAVENOUS | Status: AC
  Filled 2019-04-27: qty 10

## 2019-04-27 MED ORDER — ONDANSETRON HCL 4 MG/2ML IJ SOLN: 4.0000 mg | Freq: Once | INTRAMUSCULAR | Status: DC | PRN

## 2019-04-27 MED ORDER — LIDOCAINE 2% (20 MG/ML) 5 ML SYRINGE
INTRAMUSCULAR | Status: AC
  Filled 2019-04-27: qty 5

## 2019-04-27 MED ORDER — OXYCODONE HCL 5 MG PO TABS
5.0000 mg | ORAL_TABLET | Freq: Once | ORAL | Status: AC | PRN
  Administered 2019-04-27: 5 mg via ORAL

## 2019-04-27 MED ORDER — PREGABALIN 75 MG PO CAPS
150.0000 mg | ORAL_CAPSULE | Freq: Three times a day (TID) | ORAL | Status: DC
  Administered 2019-04-27 – 2019-04-29 (×7): 150 mg via ORAL
  Filled 2019-04-27 (×7): qty 2

## 2019-04-27 MED ORDER — HYDROMORPHONE HCL 1 MG/ML IJ SOLN
0.5000 mg | INTRAMUSCULAR | Status: DC | PRN
  Administered 2019-04-27 – 2019-04-28 (×5): 1 mg via INTRAVENOUS
  Filled 2019-04-27 (×4): qty 1

## 2019-04-27 MED ORDER — CEFAZOLIN SODIUM 500 MG IJ SOLR: 500.0000 mg | Freq: Three times a day (TID) | INTRAMUSCULAR | Status: DC

## 2019-04-27 MED ORDER — SODIUM CHLORIDE 0.9 % IV SOLN: INTRAVENOUS | Status: DC

## 2019-04-27 MED ORDER — SODIUM CHLORIDE 0.9 % IR SOLN
Status: DC | PRN
  Administered 2019-04-27: 3000 mL

## 2019-04-27 MED ORDER — BISMUTH SUBSALICYLATE 262 MG/15ML PO SUSP: 30.0000 mL | Freq: Four times a day (QID) | ORAL | Status: DC | PRN

## 2019-04-27 MED ORDER — SODIUM CHLORIDE 0.9 % IV SOLN: INTRAVENOUS | Status: DC | PRN

## 2019-04-27 MED ORDER — ONDANSETRON HCL 4 MG/2ML IJ SOLN
INTRAMUSCULAR | Status: DC | PRN
  Administered 2019-04-27: 4 mg via INTRAVENOUS

## 2019-04-27 MED ORDER — MIDAZOLAM HCL 5 MG/5ML IJ SOLN
INTRAMUSCULAR | Status: DC | PRN
  Administered 2019-04-27 (×2): 1 mg via INTRAVENOUS

## 2019-04-27 MED ORDER — ONDANSETRON HCL 4 MG/2ML IJ SOLN: 4.0000 mg | Freq: Four times a day (QID) | INTRAMUSCULAR | Status: DC | PRN

## 2019-04-27 MED ORDER — MUPIROCIN 2 % EX OINT
TOPICAL_OINTMENT | CUTANEOUS | Status: AC
  Administered 2019-04-27: 1 via TOPICAL
  Filled 2019-04-27: qty 22

## 2019-04-27 MED ORDER — HYDROMORPHONE HCL 1 MG/ML IJ SOLN
INTRAMUSCULAR | Status: AC
  Filled 2019-04-27: qty 1

## 2019-04-27 MED ORDER — DOCUSATE SODIUM 100 MG PO CAPS
100.0000 mg | ORAL_CAPSULE | Freq: Two times a day (BID) | ORAL | Status: DC
  Administered 2019-04-27 – 2019-05-01 (×8): 100 mg via ORAL
  Filled 2019-04-27 (×10): qty 1

## 2019-04-27 MED ORDER — PHENYLEPHRINE 40 MCG/ML (10ML) SYRINGE FOR IV PUSH (FOR BLOOD PRESSURE SUPPORT)
PREFILLED_SYRINGE | INTRAVENOUS | Status: AC
  Filled 2019-04-27: qty 10

## 2019-04-27 MED ORDER — HYDRALAZINE HCL 20 MG/ML IJ SOLN
5.0000 mg | INTRAMUSCULAR | Status: DC | PRN
  Administered 2019-04-29: 5 mg via INTRAVENOUS
  Filled 2019-04-27: qty 1

## 2019-04-27 MED ORDER — ACETAMINOPHEN 325 MG RE SUPP
325.0000 mg | RECTAL | Status: DC | PRN
  Filled 2019-04-27: qty 2

## 2019-04-27 MED ORDER — PROPOFOL 10 MG/ML IV BOLUS
INTRAVENOUS | Status: AC
  Filled 2019-04-27: qty 20

## 2019-04-27 MED ORDER — OXYCODONE HCL 5 MG PO TABS
ORAL_TABLET | ORAL | Status: AC
  Filled 2019-04-27: qty 1

## 2019-04-27 MED ORDER — PHENYLEPHRINE 40 MCG/ML (10ML) SYRINGE FOR IV PUSH (FOR BLOOD PRESSURE SUPPORT)
PREFILLED_SYRINGE | INTRAVENOUS | Status: DC | PRN
  Administered 2019-04-27 (×2): 80 ug via INTRAVENOUS
  Administered 2019-04-27: 40 ug via INTRAVENOUS
  Administered 2019-04-27 (×5): 80 ug via INTRAVENOUS

## 2019-04-27 MED ORDER — CEFAZOLIN SODIUM-DEXTROSE 1-4 GM/50ML-% IV SOLN
1.0000 g | INTRAVENOUS | Status: DC
  Administered 2019-04-28 – 2019-05-01 (×4): 1 g via INTRAVENOUS
  Filled 2019-04-27 (×5): qty 50

## 2019-04-27 MED ORDER — BISACODYL 5 MG PO TBEC: 5.0000 mg | DELAYED_RELEASE_TABLET | Freq: Every day | ORAL | Status: DC | PRN

## 2019-04-27 MED ORDER — SODIUM CHLORIDE 0.9 % IV SOLN
INTRAVENOUS | Status: AC
  Administered 2019-04-27: 19:00:00 via INTRAVENOUS

## 2019-04-27 MED ORDER — SENNOSIDES-DOCUSATE SODIUM 8.6-50 MG PO TABS
1.0000 | ORAL_TABLET | Freq: Every evening | ORAL | Status: DC | PRN
  Administered 2019-05-01: 1 via ORAL
  Filled 2019-04-27: qty 1

## 2019-04-27 MED ORDER — LISINOPRIL 10 MG PO TABS
10.0000 mg | ORAL_TABLET | Freq: Every day | ORAL | Status: DC
  Administered 2019-04-28 – 2019-05-01 (×3): 10 mg via ORAL
  Filled 2019-04-27 (×4): qty 1

## 2019-04-27 MED ORDER — LISINOPRIL 10 MG PO TABS: 10.0000 mg | ORAL_TABLET | Freq: Every day | ORAL | Status: DC

## 2019-04-27 MED ORDER — DIPHENHYDRAMINE HCL 25 MG PO CAPS: 25.0000 mg | ORAL_CAPSULE | Freq: Four times a day (QID) | ORAL | Status: DC | PRN

## 2019-04-27 MED ORDER — MUPIROCIN 2 % EX OINT
1.0000 "application " | TOPICAL_OINTMENT | Freq: Once | CUTANEOUS | Status: AC
  Administered 2019-04-27: 10:00:00 1 via TOPICAL

## 2019-04-27 MED ORDER — HEPARIN SODIUM (PORCINE) 5000 UNIT/ML IJ SOLN
5000.0000 [IU] | Freq: Three times a day (TID) | INTRAMUSCULAR | Status: DC
  Administered 2019-04-27 – 2019-04-29 (×7): 5000 [IU] via SUBCUTANEOUS
  Filled 2019-04-27 (×7): qty 1

## 2019-04-27 MED ORDER — INSULIN ASPART 100 UNIT/ML ~~LOC~~ SOLN
0.0000 [IU] | Freq: Three times a day (TID) | SUBCUTANEOUS | Status: DC
  Administered 2019-04-27: 1 [IU] via SUBCUTANEOUS
  Administered 2019-04-28: 2 [IU] via SUBCUTANEOUS
  Administered 2019-04-28: 1 [IU] via SUBCUTANEOUS
  Administered 2019-04-29: 2 [IU] via SUBCUTANEOUS
  Administered 2019-04-29 – 2019-04-30 (×3): 3 [IU] via SUBCUTANEOUS
  Administered 2019-05-01 (×2): 2 [IU] via SUBCUTANEOUS
  Administered 2019-05-02: 1 [IU] via SUBCUTANEOUS

## 2019-04-27 MED ORDER — OXYCODONE HCL 5 MG PO TABS
5.0000 mg | ORAL_TABLET | ORAL | Status: DC | PRN
  Administered 2019-04-27 – 2019-05-02 (×18): 10 mg via ORAL
  Filled 2019-04-27 (×17): qty 2

## 2019-04-27 MED ORDER — INSULIN GLARGINE 100 UNIT/ML ~~LOC~~ SOLN
54.0000 [IU] | Freq: Every evening | SUBCUTANEOUS | Status: DC | PRN
  Administered 2019-05-01: 54 [IU] via SUBCUTANEOUS
  Filled 2019-04-27 (×3): qty 0.54

## 2019-04-27 MED ORDER — ONDANSETRON HCL 4 MG/2ML IJ SOLN
INTRAMUSCULAR | Status: AC
  Filled 2019-04-27: qty 2

## 2019-04-27 MED ORDER — PHENOL 1.4 % MT LIQD: 1.0000 | OROMUCOSAL | Status: DC | PRN

## 2019-04-27 MED ORDER — CEFAZOLIN SODIUM-DEXTROSE 2-4 GM/100ML-% IV SOLN
INTRAVENOUS | Status: AC
  Filled 2019-04-27: qty 100

## 2019-04-27 MED ORDER — METOPROLOL TARTRATE 5 MG/5ML IV SOLN: 2.0000 mg | INTRAVENOUS | Status: DC | PRN

## 2019-04-27 SURGICAL SUPPLY — 50 items
BANDAGE ESMARK 6X9 LF (GAUZE/BANDAGES/DRESSINGS) IMPLANT
BLADE SAW RECIP 87.9 MT (BLADE) ×3 IMPLANT
BNDG COHESIVE 6X5 TAN STRL LF (GAUZE/BANDAGES/DRESSINGS) IMPLANT
BNDG ELASTIC 6X5.8 VLCR STR LF (GAUZE/BANDAGES/DRESSINGS) IMPLANT
BNDG ESMARK 6X9 LF (GAUZE/BANDAGES/DRESSINGS)
BNDG GAUZE ELAST 4 BULKY (GAUZE/BANDAGES/DRESSINGS) IMPLANT
CANISTER SUCT 3000ML PPV (MISCELLANEOUS) ×6 IMPLANT
CANISTER WOUNDNEG PRESSURE 500 (CANNISTER) ×3 IMPLANT
CATH PULSE SPRAY 45CMX15CM 5FR (CATHETERS) ×3 IMPLANT
CLIP VESOCCLUDE MED 6/CT (CLIP) ×3 IMPLANT
COVER BACK TABLE 60X90IN (DRAPES) IMPLANT
COVER SURGICAL LIGHT HANDLE (MISCELLANEOUS) ×3 IMPLANT
COVER WAND RF STERILE (DRAPES) IMPLANT
CUFF TOURN SGL QUICK 18X4 (TOURNIQUET CUFF) IMPLANT
CUFF TOURN SGL QUICK 24 (TOURNIQUET CUFF)
CUFF TOURN SGL QUICK 34 (TOURNIQUET CUFF)
CUFF TOURN SGL QUICK 42 (TOURNIQUET CUFF) IMPLANT
CUFF TRNQT CYL 24X4X16.5-23 (TOURNIQUET CUFF) IMPLANT
CUFF TRNQT CYL 34X4.125X (TOURNIQUET CUFF) IMPLANT
DRAIN CHANNEL 19F RND (DRAIN) IMPLANT
DRAPE HALF SHEET 40X57 (DRAPES) ×6 IMPLANT
DRAPE ORTHO SPLIT 77X108 STRL (DRAPES) ×2
DRAPE SURG ORHT 6 SPLT 77X108 (DRAPES) ×4 IMPLANT
DRSG ADAPTIC 3X8 NADH LF (GAUZE/BANDAGES/DRESSINGS) ×3 IMPLANT
DRSG VAC ATS MED SENSATRAC (GAUZE/BANDAGES/DRESSINGS) ×3 IMPLANT
ELECT REM PT RETURN 9FT ADLT (ELECTROSURGICAL) ×3
ELECTRODE REM PT RTRN 9FT ADLT (ELECTROSURGICAL) ×2 IMPLANT
EVACUATOR SILICONE 100CC (DRAIN) IMPLANT
GAUZE SPONGE 4X4 12PLY STRL (GAUZE/BANDAGES/DRESSINGS) ×6 IMPLANT
GLOVE BIO SURGEON STRL SZ7.5 (GLOVE) ×3 IMPLANT
GOWN STRL REUS W/ TWL LRG LVL3 (GOWN DISPOSABLE) ×6 IMPLANT
GOWN STRL REUS W/TWL LRG LVL3 (GOWN DISPOSABLE) ×3
KIT BASIN OR (CUSTOM PROCEDURE TRAY) ×3 IMPLANT
KIT TURNOVER KIT B (KITS) ×3 IMPLANT
NS IRRIG 1000ML POUR BTL (IV SOLUTION) ×3 IMPLANT
PACK GENERAL/GYN (CUSTOM PROCEDURE TRAY) ×3 IMPLANT
PAD ARMBOARD 7.5X6 YLW CONV (MISCELLANEOUS) ×6 IMPLANT
SPONGE LAP 18X18 RF (DISPOSABLE) ×6 IMPLANT
STAPLER VISISTAT 35W (STAPLE) ×3 IMPLANT
STOCKINETTE IMPERVIOUS LG (DRAPES) ×3 IMPLANT
SUT ETHILON 3 0 PS 1 (SUTURE) IMPLANT
SUT SILK 2 0 (SUTURE) ×1
SUT SILK 2 0 SH CR/8 (SUTURE) ×3 IMPLANT
SUT SILK 2-0 18XBRD TIE 12 (SUTURE) ×2 IMPLANT
SUT VIC AB 2-0 SH 18 (SUTURE) ×6 IMPLANT
SUT VIC AB 3-0 SH 27 (SUTURE) ×1
SUT VIC AB 3-0 SH 27X BRD (SUTURE) ×2 IMPLANT
TOWEL GREEN STERILE (TOWEL DISPOSABLE) ×6 IMPLANT
UNDERPAD 30X30 (UNDERPADS AND DIAPERS) ×3 IMPLANT
WATER STERILE IRR 1000ML POUR (IV SOLUTION) ×3 IMPLANT

## 2019-04-27 NOTE — Anesthesia Preprocedure Evaluation (Addendum)
Anesthesia Evaluation  Patient identified by MRN, date of birth, ID band Patient awake    Reviewed: Allergy & Precautions, NPO status , Patient's Chart, lab work & pertinent test results  History of Anesthesia Complications Negative for: history of anesthetic complications  Airway Mallampati: II  TM Distance: >3 FB Neck ROM: Full    Dental  (+) Edentulous Lower, Edentulous Upper   Pulmonary Current SmokerPatient did not abstain from smoking.,   Denies hx COPD    Pulmonary exam normal        Cardiovascular hypertension, Pt. on medications (-) angina+ Peripheral Vascular Disease  Normal cardiovascular exam   '20 Carotid US - 40-59% left ICAS, 1-39% right ICAS    Neuro/Psych  Headaches, PSYCHIATRIC DISORDERS Anxiety Depression  Neuropathy     GI/Hepatic Neg liver ROS, hiatal hernia, GERD  Controlled,  Endo/Other  diabetes, Type 2, Insulin DependentHypothyroidism  Obesity   Renal/GU ESRF and DialysisRenal disease     Musculoskeletal  (+) Arthritis ,   Abdominal   Peds  Hematology negative hematology ROS (+)   Anesthesia Other Findings   Reproductive/Obstetrics                            Anesthesia Physical Anesthesia Plan  ASA: III  Anesthesia Plan: General   Post-op Pain Management:    Induction: Intravenous  PONV Risk Score and Plan: 2 and Treatment may vary due to age or medical condition, Ondansetron and Midazolam  Airway Management Planned: LMA  Additional Equipment: None  Intra-op Plan:   Post-operative Plan: Extubation in OR  Informed Consent: I have reviewed the patients History and Physical, chart, labs and discussed the procedure including the risks, benefits and alternatives for the proposed anesthesia with the patient or authorized representative who has indicated his/her understanding and acceptance.     Dental advisory given  Plan Discussed with: CRNA  and Anesthesiologist  Anesthesia Plan Comments:       Anesthesia Quick Evaluation

## 2019-04-27 NOTE — Interval H&P Note (Signed)
History and Physical Interval Note:  04/27/2019 12:36 PM  [name]  has presented today for surgery, with the diagnosis of LEFT LEG NONHEALING STUMP.  The various methods of treatment have been discussed with the patient and family. After consideration of risks, benefits and other options for treatment, the patient has consented to  Procedure(s): AMPUTATION BELOW KNEE REVISION (Right) as a surgical intervention.  The patient's history has been reviewed, patient examined, no change in status, stable for surgery.  I have reviewed the patient's chart and labs.  Questions were answered to the patient's satisfaction.     [name]

## 2019-04-27 NOTE — Transfer of Care (Signed)
Immediate Anesthesia Transfer of Care Note  Patient: [name]  Procedure(s) Performed: AMPUTATION BELOW KNEE REVISION (Right Knee) APPLICATION OF WOUND VAC (Right Leg Lower)  Patient Location: PACU  Anesthesia Type:General  Level of Consciousness: awake, alert  and oriented  Airway & Oxygen Therapy: Patient Spontanous Breathing and Patient connected to face mask oxygen  Post-op Assessment: Report given to RN and Post -op Vital signs reviewed and stable  Post vital signs: Reviewed and stable  Last Vitals:  Vitals Value Taken Time  BP 155/79 04/27/19 1449  Temp 36.7 C 04/27/19 1450  Pulse 76 04/27/19 1453  Resp 11 04/27/19 1453  SpO2 99 % 04/27/19 1453  Vitals shown include unvalidated device data.  Last Pain:  Vitals:   04/27/19 0933  PainSc: 4       Patients Stated Pain Goal: 4 (46/65/99 3570)  Complications: No apparent anesthesia complications

## 2019-04-27 NOTE — Op Note (Signed)
Procedure: Debridement of right below-knee amputation including resection of tibia and fibula  Preoperative diagnosis: Poorly healing right below-knee amputation  Postoperative diagnosis: Same  Anesthesia: General  Assistant: Gerri Lins, PA-C  Operative findings: Areas of severe muscle necrosis and liquefication  Areas of nonunion of the tibia and fibula with poor incorporation of the bony tissues suggestive of osteomyelitis  Operative details: After team informed consent, patient taken the operating.  The patient placed in supine position operating table.  After induction general esthesia in the tracheal intubation the patient's entire right lower extremities prepped and draped in usual sterile fashion.  Next the below-knee incision from his previous amputation was reopened through a sinus tract and carried all the way down to the level of the muscle.  The muscle was brown in appearance.  There were several areas of liquefied muscle and this was debrided away.  The flap was completely opened up and the muscle tissue debrided back to where it was bleeding.  Even with this extensive debridement there were still areas of muscle that appeared nonviable.  However, the patient did not consent to an above-knee amputation and so I debrided this back to as healthy appearing tissue as possible to remove all grossly necrotic tissue.  I then resected the tibia back another 3 cm in the fibula back another 2 cm.  The wound was thoroughly irrigated with pulse lavage with 3 L.  Hemostasis was obtained.  A VAC dressing was then applied.  Patient tolerated procedure well and there were no complications.  The instrument sponge and needle counts correct in the case.  The patient taken the recovery in stable condition.  [name], MD [facility] Office: [phone] Pager: [phone]

## 2019-04-27 NOTE — Anesthesia Procedure Notes (Signed)
Procedure Name: LMA Insertion Date/Time: 04/27/2019 1:13 PM Performed by: Janene Harvey, CRNA Pre-anesthesia Checklist: Patient identified, Emergency Drugs available, Suction available, Patient being monitored and Timeout performed Patient Re-evaluated:Patient Re-evaluated prior to induction Oxygen Delivery Method: Circle system utilized Preoxygenation: Pre-oxygenation with 100% oxygen Induction Type: IV induction LMA: LMA inserted LMA Size: 5.0 Dental Injury: Teeth and Oropharynx as per pre-operative assessment

## 2019-04-27 NOTE — Plan of Care (Signed)
  Problem: Pain Managment: Goal: General experience of comfort will improve Outcome: Progressing   

## 2019-04-27 NOTE — Interval H&P Note (Signed)
History and Physical Interval Note:  04/27/2019 12:35 PM  [name]  has presented today for surgery, with the diagnosis of LEFT LEG NONHEALING STUMP.  The various methods of treatment have been discussed with the patient and family. After consideration of risks, benefits and other options for treatment, the patient has consented to  Procedure(s): AMPUTATION BELOW KNEE REVISION (Right) as a surgical intervention.  The patient's history has been reviewed, patient examined, no change in status, stable for surgery.  I have reviewed the patient's chart and labs.  Questions were answered to the patient's satisfaction.     [name]

## 2019-04-28 ENCOUNTER — Encounter (HOSPITAL_COMMUNITY): Payer: Self-pay | Admitting: Vascular Surgery

## 2019-04-28 ENCOUNTER — Other Ambulatory Visit: Payer: Self-pay

## 2019-04-28 LAB — GLUCOSE, CAPILLARY
Glucose-Capillary: 160 mg/dL — ABNORMAL HIGH (ref 70–99)
Glucose-Capillary: 140 mg/dL — ABNORMAL HIGH (ref 70–99)
Glucose-Capillary: 97 mg/dL (ref 70–99)
Glucose-Capillary: 89 mg/dL (ref 70–99)

## 2019-04-28 MED ORDER — CHLORHEXIDINE GLUCONATE CLOTH 2 % EX PADS
6.0000 | MEDICATED_PAD | Freq: Every day | CUTANEOUS | Status: DC
  Administered 2019-04-28 – 2019-04-29 (×2): 6 via TOPICAL

## 2019-04-28 NOTE — Progress Notes (Signed)
Pt noted with temp 101.9. PRN Tylenol given. Notified on call Dr Doren Custard with no new orders.

## 2019-04-28 NOTE — Consult Note (Signed)
Renal Service Consult Note [facility]  [name] 04/28/2019 [name] Requesting Physician:  Dr [name]  Reason for Consult:  ESRD pt sp revision of L BKA wound HPI: The patient is a 68 y.o. year-old with hx of DM2, ESRD on HD, L BKA, HTN, depression, COPD and DJD admitted for elective revision of L BKA wounds. Had surgery 9/14. We are asked to see for ESRD.    Patient on HD for 14 yrs, gets HD TTS at [facility], has not missed any HD, states he "stays on the machine for 5 hrs".  They pull 3-5 liters usually and tell him he drinks too much fluid.  Lives w/ his wife , occ tobacco, no etoh.  Was in [place] for 9 mos in 2018 for not going to dialysis sessions. Has smoker's cough in am and through the day. Is wheezing now, states not normal.  Is jerking now also , also states is not normal.  Had surg yest on his stump R BKA. Is getting po and IV narcotics regularly , no gabapentin or lyrica.   No CP or SOB, no abd pain.    ROS  denies CP  no joint pain   no HA  no blurry vision  no rash  no diarrhea  no nausea/ vomiting     Past Medical History  Past Medical History:  Diagnosis Date  . Anemia   . Anxiety   . Arthritis   . Chronic kidney disease    Dialysis T/Th/Sa  . COPD (chronic obstructive pulmonary disease) ([place])    Pt denies  . Depression   . Diabetes mellitus without complication ([place])   . GERD (gastroesophageal reflux disease)   . H/O hiatal hernia   . Headache(784.0)    Hx: Migraines  . History of kidney stones   . Hypertension   . Neuropathy   . Non-healing wound of amputation stump ([place])    right  . Numbness of toes    toes and feet  . Pneumonia    Hx: of several times - pt denies (04/24/19)  . Renal insufficiency   . Shortness of breath dyspnea   . Type 2 diabetes mellitus [facility])    Past Surgical History  Past Surgical History:  Procedure Laterality Date  . A/V FISTULAGRAM Left 01/16/2019   Procedure: A/V FISTULAGRAM;   Surgeon: [name], MD;  Location: [place] CV LAB;  Service: Cardiovascular;  Laterality: Left;  . AMPUTATION Right 12/01/2018   Procedure: RIGHT AMPUTATION BELOW KNEE;  Surgeon: [name], MD;  Location: [place];  Service: Vascular;  Laterality: Right;  . AMPUTATION Right 04/27/2019   Procedure: AMPUTATION BELOW KNEE REVISION;  Surgeon: [name], MD;  Location: [place];  Service: Vascular;  Laterality: Right;  . APPLICATION OF WOUND VAC Right 04/27/2019   Procedure: APPLICATION OF WOUND VAC;  Surgeon: [name], MD;  Location: [place];  Service: Vascular;  Laterality: Right;  . AV FISTULA PLACEMENT  2012      left arm   . AV FISTULA PLACEMENT Left 12/18/2012   Procedure: ARTERIOVENOUS (AV) FISTULA CREATION;  Surgeon: Angelia Mould, MD;  Location: [place];  Service: Vascular;  Laterality: Left;  . COLONOSCOPY  10/26/2011   Procedure: COLONOSCOPY;  Surgeon: Rogene Houston, MD;  [facility];  Service: Endoscopy;  Laterality: N/A;  730  . EMBOLECTOMY Left 12/09/2012   Procedure: EMBOLECTOMY;  Surgeon: Serafina Mitchell, MD;  Location: [place] CATH LAB;  Service: Cardiovascular;  Laterality: Left;  left arm venous embolization  . ESOPHAGOGASTRODUODENOSCOPY (EGD) WITH ESOPHAGEAL DILATION N/A 04/23/2013   Procedure: ESOPHAGOGASTRODUODENOSCOPY (EGD) WITH ESOPHAGEAL DILATION;  Surgeon: Rogene Houston, MD;  [facility];  Service: Endoscopy;  Laterality: N/A;  200-moved to 930   . FISTULA SUPERFICIALIZATION Left 06/18/2013   Procedure: FISTULA SUPERFICIALIZATION & LIGATION BRANCH X 1;  Surgeon: Mal Misty, MD;  Location: [place];  Service: Vascular;  Laterality: Left;  . HEMATOMA EVACUATION Right 02/11/2017   Procedure: EVACUATION HEMATOMA RIGHT GROIN, Repair of Right Pseudo-anerysm.;  Surgeon: [name], MD;  [facility];  Service: Vascular;  Laterality: Right;  . INGUINAL HERNIA REPAIR     ,  times   2  . INSERTION OF DIALYSIS CATHETER Left  12/18/2012   Procedure: INSERTION OF DIALYSIS CATHETER;  Surgeon: Angelia Mould, MD;  Location: [place];  Service: Vascular;  Laterality: Left;  . KNEE ARTHROSCOPY  2011   Right Knee  . LOWER EXTREMITY ANGIOGRAPHY N/A 02/11/2017   Procedure: Lower Extremity Angiography;  Surgeon: Lorretta Harp, MD;  Location: [place] CV LAB;  Service: Cardiovascular;  Laterality: N/A;  . PERIPHERAL ATHRECTOMY  02/11/2017  . PERIPHERAL VASCULAR ATHERECTOMY Left 02/11/2017   Procedure: Peripheral Vascular Atherectomy;  Surgeon: Lorretta Harp, MD;  Location: [place] CV LAB;  Service: Cardiovascular;  Laterality: Left;  . PERIPHERAL VASCULAR BALLOON ANGIOPLASTY Left 01/16/2019   Procedure: PERIPHERAL VASCULAR BALLOON ANGIOPLASTY;  Surgeon: [name], MD;  Location: [place] CV LAB;  Service: Cardiovascular;  Laterality: Left;  central vein  . REVISON OF ARTERIOVENOUS FISTULA Left 0/63/0160   Procedure: PLICATION OF LEFT BRACHIOCEPHALIC ARTERIOVENOUS FISTULA;  Surgeon: Conrad [place], MD;  Location: [place];  Service: Vascular;  Laterality: Left;  . SHUNTOGRAM N/A 12/09/2012   Procedure: fistulogram;  Surgeon: Serafina Mitchell, MD;  Location: [facility] CATH LAB;  Service: Cardiovascular;  Laterality: N/A;  . SHUNTOGRAM Left 06/03/2013   Procedure: Fistulogram;  Surgeon: Serafina Mitchell, MD;  Location: [facility] CATH LAB;  Service: Cardiovascular;  Laterality: Left;  . THROMBECTOMY W/ EMBOLECTOMY Left 12/11/2012   Procedure: THROMBECTOMY ARTERIOVENOUS FISTULA;  Surgeon: Serafina Mitchell, MD;  Location: [place];  Service: Vascular;  Laterality: Left;  . TONSILLECTOMY    . WOUND DEBRIDEMENT Right 02/11/2019   Procedure: DEBRIDEMENT WOUND RIGHT BELOW THE KNEE STUMP;  Surgeon: Serafina Mitchell, MD;  Location: [facility] OR;  Service: Vascular;  Laterality: Right;   Family History  Family History  Problem Relation Age of Onset  . Heart disease Father        Heart Disease before age 6   Social History  reports that he has  been smoking cigarettes. He has a 5.00 pack-year smoking history. He has never used smokeless tobacco. He reports that he does not drink alcohol or use drugs. Allergies No Known Allergies Home medications Prior to Admission medications   Medication Sig Start Date End Date Taking? Authorizing Provider  atorvastatin (LIPITOR) 80 MG tablet Take 1 tablet (80 mg total) by mouth at bedtime. Patient taking differently: Take 80 mg by mouth daily.  12/18/18  Yes Angiulli, Lavon Paganini, PA-C  calcium acetate (PHOSLO) 667 MG capsule Take 4 capsules (2,668 mg total) by mouth 3 (three) times daily with meals. Patient taking differently: Take 1,334-2,668 mg by mouth See admin instructions. Take 2668 mg with meals and 1334 with each snack 12/19/18  Yes Angiulli, Lavon Paganini,  PA-C  cephALEXin (KEFLEX) 500 MG capsule Take 1 capsule (500 mg total) by mouth 2 (two) times daily. 04/09/19  Yes Fields, Jessy Oto, MD  cinacalcet (SENSIPAR) 30 MG tablet Take 1 tablet (30 mg total) by mouth every evening. 12/19/18  Yes Angiulli, Lavon Paganini, PA-C  clopidogrel (PLAVIX) 75 MG tablet Take 1 tablet (75 mg total) by mouth daily. 12/18/18  Yes Angiulli, Lavon Paganini, PA-C  collagenase (SANTYL) ointment Apply 1 application topically daily as needed (wound care).   Yes [provider]  diphenhydrAMINE (BENADRYL) 25 MG tablet Take 25 mg by mouth daily as needed for itching.   Yes [provider]  ibuprofen (ADVIL) 200 MG tablet Take 800 mg by mouth 3 (three) times daily as needed for headache or moderate pain.   Yes [provider]  levothyroxine (SYNTHROID) 125 MCG tablet Take 1 tablet (125 mcg total) by mouth daily before breakfast. Patient taking differently: Take 125 mcg by mouth at bedtime.  12/18/18  Yes Angiulli, Lavon Paganini, PA-C  lisinopril (ZESTRIL) 10 MG tablet Take 10 mg by mouth daily.   Yes [provider]  Melatonin 10 MG CAPS Take 10 mg by mouth at bedtime.   Yes [provider]  pregabalin  (LYRICA) 150 MG capsule Take 1 capsule (150 mg total) by mouth 3 (three) times daily. 12/18/18  Yes Angiulli, Lavon Paganini, PA-C  risperiDONE (RISPERDAL) 0.25 MG tablet Take 1 tablet (0.25 mg total) by mouth daily. 12/18/18  Yes Angiulli, Lavon Paganini, PA-C  TOUJEO SOLOSTAR 300 UNIT/ML SOPN Inject 54-84 Units as directed at bedtime as needed (if blood sugar is 140 or higher).  01/12/19  Yes [provider]  ARTIFICIAL TEAR OP Place 1 drop into both eyes every 6 (six) hours as needed (dry eyes).    [provider]  bismuth subsalicylate (PEPTO BISMOL) 262 MG/15ML suspension Take 30 mLs by mouth every 6 (six) hours as needed for indigestion.    [provider]  polyethylene glycol (MIRALAX / GLYCOLAX) 17 g packet Take 17 g by mouth daily as needed for moderate constipation.    [provider]   Liver Function Tests No results for input(s): AST, ALT, ALKPHOS, BILITOT, PROT, ALBUMIN in the last 168 hours. No results for input(s): LIPASE, AMYLASE in the last 168 hours. CBC Recent Labs  Lab 04/27/19 0949 04/27/19 1043  WBC  --  7.8  HGB 11.6* 11.3*  HCT 34.0* 36.5*  MCV  --  100.0  PLT  --  161   Basic Metabolic Panel Recent Labs  Lab 04/27/19 0949  NA 136  K 5.1  GLUCOSE 230*   Iron/TIBC/Ferritin/ %Sat    Component Value Date/Time   IRON 52 12/12/2018 1323   TIBC 242 (L) 12/12/2018 1323   FERRITIN 36 05/08/2012 1155   IRONPCTSAT 21 12/12/2018 1323    Vitals:   04/27/19 2037 04/28/19 0030 04/28/19 0511 04/28/19 1000  BP: (!) 142/64 140/67 (!) 159/68 (!) 152/66  Pulse: 78 80 80 83  Resp: 18 17 17    Temp: 98 F (36.7 C) 97.9 F (36.6 C) 99.8 F (37.7 C) 99.1 F (37.3 C)  TempSrc:  Oral Oral   SpO2: 97% 97% 98% 99%  Weight:      Height:        Exam Gen alert, jerking mild- mod off and on No rash, cyanosis or gangrene Sclera anicteric, throat clear  No jvd or bruits Chest mostly clear bilat, no rales, mild occ wheezes / upper  airway noises RRR  no MRG Abd soft ntnd no mass or ascites +bs obese GU normal male MS no joint effusions or deformity Ext no pitting LE or UE edema, R BKA dressed Neuro is alert, Ox 3 , nf, + myoclonic jerking of UE's and torso RUA AVF+bruit   Home meds:  - atorvastatin 80/ clopidogrel 75  - lisinopril 10 qd  - risperidone 0.25 qd/ pregabalin 150 tid  - cinacalcet 30 hs/ calcium acetate 4 ac tid  - levothyroxine 125 ug  - prn's/ vitamins/ supplements     Outpt HD: [facility] [place] TTS  4.5h  400/600  LUE AVF   Hep 2000 then 1000/hr (from last admit)   Assessment/ Plan: 1. SP revision of R BKA wound 9/14 2. ESRD - on HD TTS in [place], [place]. ESRD x 14 yrs. Will plan HD today 3. Myoclonic jerking - suspect due to narcotics, may improve w/ HD. Check B/Cr, no chem panel done yet.  4. Volume - suspect mild vol overload by exam, get OP records in am 5. HTN - takes acei only 6. H/o DM - not on any medication at this time 7. Psych - takes Risperdal 8. Anemia ckd - Hb 11, no need esa      Kelly Splinter  MD 04/28/2019, 11:48 AM

## 2019-04-28 NOTE — TOC Initial Note (Signed)
Transition of Care [facility]) - Initial/Assessment Note    Patient Details  Name: [name] MRN: [id number] Date of Birth: 1951/03/12  Transition [facility]) CM/SW Contact:    Marilu Favre, RN Phone Number: 04/28/2019, 11:16 AM  Clinical Narrative:                  Spoke to patient at bedside, with patient's wife on speaker phone, patient consented.   Patient from home with wife. He has walker, 3 in 1 and wheelchair at home.   He has been to [place] in past. He has had [facility] for home health in past. He has also had another home health agency in past but unsure of name.   Provided patient with medicare.gov list of home health agencies.   Talked to [facility] PA earlier who wants NCM to wait on starting home VAC progress. Discussed with patient.   Will continue to follow.   If needed home VAC application in shadow chart, will need signature and wound measurements . PA aware. Expected Discharge Plan: [place] Barriers to Discharge: Continued Medical Work up   Patient Goals and CMS Choice Patient states their goals for this hospitalization and ongoing recovery are:: to go home CMS Medicare.gov Compare Post Acute Care list provided to:: Patient Choice offered to / list presented to : Patient  Expected Discharge Plan and Services Expected Discharge Plan: [place]       Living arrangements for the past 2 months: Single Family Home                 DME Arranged: N/A                    Prior Living Arrangements/Services Living arrangements for the past 2 months: Single Family Home Lives with:: Spouse Patient language and need for interpreter reviewed:: Yes Do you feel safe going back to the place where you live?: Yes      Need for Family Participation in Patient Care: Yes (Comment) Care giver support system in place?: Yes (comment) Current home services: DME Criminal Activity/Legal Involvement  Pertinent to Current Situation/Hospitalization: No - Comment as needed  Activities of Daily Living Home Assistive Devices/Equipment: CBG Meter, Eyeglasses, Blood pressure cuff, Dentures (specify type), Cane (specify quad or straight), Wheelchair, Environmental consultant (specify type), Grab bars in shower, Shower chair with back, Grab bars around toilet, Bedside commode/3-in-1(full uppers/lowers) ADL Screening (condition at time of admission) Patient's cognitive ability adequate to safely complete daily activities?: Yes Is the patient deaf or have difficulty hearing?: No Does the patient have difficulty seeing, even when wearing glasses/contacts?: Yes Does the patient have difficulty concentrating, remembering, or making decisions?: No Patient able to express need for assistance with ADLs?: Yes Does the patient have difficulty dressing or bathing?: No Does the patient have difficulty walking or climbing stairs?: Yes Weakness of Legs: Both(r. bka ) Weakness of Arms/Hands: None  Permission Sought/Granted   Permission granted to share information with : No              Emotional Assessment Appearance:: Appears stated age Attitude/Demeanor/Rapport: Engaged Affect (typically observed): Accepting Orientation: : Oriented to Self, Oriented to Place, Oriented to  Time, Oriented to Situation Alcohol / Substance Use: Not Applicable Psych Involvement: No (comment)  Admission diagnosis:  LEFT LEG NONHEALING STUMP Patient Active Problem List   Diagnosis Date Noted  . Post-operative pain   .  Labile blood pressure   . Labile blood glucose   . Hx of anxiety disorder   . Hypoglycemia   . Diabetic peripheral neuropathy ([place])   . Neuropathic pain   . S/P unilateral BKA (below knee amputation), right ([place])   . Sleep disturbance   . Type 2 diabetes mellitus with peripheral neuropathy ([facility])   . Benign essential HTN   . Acute blood loss anemia   . Anemia of chronic disease   . Right below-knee amputee ([place])  12/03/2018  . Necrotizing fasciitis of ankle and foot ([place])   . Unilateral complete BKA, right, initial encounter ([place])   . Postoperative pain   . Phantom limb pain ([place])   . Drug induced constipation   . Poorly controlled type 2 diabetes mellitus with peripheral neuropathy ([place])   . Acute on chronic anemia   . Ischemia of extremity 12/01/2018  . PAD (peripheral artery disease) ([place]) 02/12/2017  . Critical lower limb ischemia 02/11/2017  . Acute respiratory failure with hypoxia ([place]) 08/28/2015  . Hypertensive urgency 08/28/2015  . Chronic anemia 08/28/2015  . ESRD on dialysis ([employer]) 08/28/2015  . Diabetes mellitus with end stage renal disease ([place]) 08/28/2015  . PVD (peripheral vascular disease) ([place]) 07/15/2014  . Peripheral vascular disease, unspecified ([place]) 01/14/2014  . Pain in limb-left arm 04/09/2013  . Guaiac + stool 04/07/2013  . Anemia 04/07/2013  . Dysphagia, unspecified(787.20) 04/07/2013  . Numbness and tingling-left arm  02/04/2013  . Cold hands and feet-left arm 02/04/2013  . Other complications due to renal dialysis device, implant, and graft 06/06/2012  . End stage renal disease ([place]) 02/05/2012  . Injury to blood vessels, unspecified site 02/05/2012  . Pain in joint, shoulder region 09/14/2011   PCP:  Caryl Bis, MD Pharmacy:   [facility] [place] [address] [place] [address] Phone: [phone] Fax: [phone]     Social Determinants of Health (SDOH) Interventions    Readmission Risk Interventions No flowsheet data found.

## 2019-04-28 NOTE — Anesthesia Postprocedure Evaluation (Signed)
Anesthesia Post Note  Patient: [name]  Procedure(s) Performed: AMPUTATION BELOW KNEE REVISION (Right Knee) APPLICATION OF WOUND VAC (Right Leg Lower)     Patient location during evaluation: PACU Anesthesia Type: General Level of consciousness: awake and alert Pain management: pain level controlled Vital Signs Assessment: post-procedure vital signs reviewed and stable Respiratory status: spontaneous breathing, nonlabored ventilation, respiratory function stable and patient connected to nasal cannula oxygen Cardiovascular status: blood pressure returned to baseline and stable Postop Assessment: no apparent nausea or vomiting Anesthetic complications: no    Last Vitals:  Vitals:   04/28/19 0030 04/28/19 0511  BP: 140/67 (!) 159/68  Pulse: 80 80  Resp: 17 17  Temp: 36.6 C 37.7 C  SpO2: 97% 98%    Last Pain:  Vitals:   04/28/19 0740  TempSrc:   PainSc: 7                  Lashaun Poch S

## 2019-04-28 NOTE — Plan of Care (Signed)
  Problem: Education: Goal: Knowledge of General Education information will improve Description: Including pain rating scale, medication(s)/side effects and non-pharmacologic comfort measures Outcome: Progressing   Problem: Education: Goal: Knowledge of General Education information will improve Description: Including pain rating scale, medication(s)/side effects and non-pharmacologic comfort measures Outcome: Progressing   Problem: Education: Goal: Knowledge of General Education information will improve Description: Including pain rating scale, medication(s)/side effects and non-pharmacologic comfort measures Outcome: Progressing   Problem: Clinical Measurements: Goal: Ability to maintain clinical measurements within normal limits will improve Outcome: Progressing Goal: Diagnostic test results will improve Outcome: Progressing Goal: Respiratory complications will improve Outcome: Progressing

## 2019-04-28 NOTE — Plan of Care (Signed)
  Problem: Nutrition: Goal: Adequate nutrition will be maintained Outcome: Progressing   Problem: Safety: Goal: Ability to remain free from injury will improve Outcome: Progressing   Problem: Skin Integrity: Goal: Risk for impaired skin integrity will decrease Outcome: Progressing   

## 2019-04-28 NOTE — Progress Notes (Addendum)
[facility] [place]  Subjective  - Very painful night.   Objective (!) 159/68 80 99.8 F (37.7 C) (Oral) 17 98%  Intake/Output Summary (Last 24 hours) at 04/28/2019 0901 Last data filed at 04/28/2019 0525 Gross per 24 hour  Intake 980 ml  Output 450 ml  Net 530 ml    Right BKA with wound vac to suction 150 cc out put. Painful to touch Lungs non labored breathing    Assessment/Planning: POD # 1 Debridement of right below-knee amputation including resection of tibia and fibula   Discussed level of dead tissue and he may be willing to have AKA.  He wants to discuss this with Dr. [name]. Pending disposition before I sign wound vac orders for home.  Roxy Horseman 04/28/2019 9:01 AM --   Needs HD today Tremors most likely secondary to narcotic with renal failure Pain is poorly controlled  He is chronic pain meds and has tolerance continue to monitor for now He says morphine does not work for him JAARS, MD [facility] [place]: [phone] Pager: [phone]  Laboratory Lab Results: Recent Labs    04/27/19 0949 04/27/19 1043  WBC  --  7.8  HGB 11.6* 11.3*  HCT 34.0* 36.5*  PLT  --  274   BMET Recent Labs    04/27/19 0949  NA 136  K 5.1  GLUCOSE 230*    COAG Lab Results  Component Value Date   INR 1.1 02/08/2017   INR 0.94 11/27/2010   INR 0.97 08/24/2010   No results found for: PTT

## 2019-04-29 ENCOUNTER — Encounter: Payer: Medicare Other | Attending: Physical Medicine & Rehabilitation | Admitting: Physical Medicine & Rehabilitation

## 2019-04-29 ENCOUNTER — Inpatient Hospital Stay (HOSPITAL_COMMUNITY): Payer: Medicare Other

## 2019-04-29 DIAGNOSIS — G479 Sleep disorder, unspecified: Secondary | ICD-10-CM | POA: Insufficient documentation

## 2019-04-29 DIAGNOSIS — Z8249 Family history of ischemic heart disease and other diseases of the circulatory system: Secondary | ICD-10-CM | POA: Insufficient documentation

## 2019-04-29 DIAGNOSIS — F17210 Nicotine dependence, cigarettes, uncomplicated: Secondary | ICD-10-CM | POA: Insufficient documentation

## 2019-04-29 DIAGNOSIS — G894 Chronic pain syndrome: Secondary | ICD-10-CM | POA: Insufficient documentation

## 2019-04-29 DIAGNOSIS — E1122 Type 2 diabetes mellitus with diabetic chronic kidney disease: Secondary | ICD-10-CM | POA: Insufficient documentation

## 2019-04-29 DIAGNOSIS — I120 Hypertensive chronic kidney disease with stage 5 chronic kidney disease or end stage renal disease: Secondary | ICD-10-CM | POA: Insufficient documentation

## 2019-04-29 DIAGNOSIS — J449 Chronic obstructive pulmonary disease, unspecified: Secondary | ICD-10-CM | POA: Insufficient documentation

## 2019-04-29 DIAGNOSIS — K219 Gastro-esophageal reflux disease without esophagitis: Secondary | ICD-10-CM | POA: Insufficient documentation

## 2019-04-29 DIAGNOSIS — F17290 Nicotine dependence, other tobacco product, uncomplicated: Secondary | ICD-10-CM | POA: Insufficient documentation

## 2019-04-29 DIAGNOSIS — E1142 Type 2 diabetes mellitus with diabetic polyneuropathy: Secondary | ICD-10-CM | POA: Insufficient documentation

## 2019-04-29 DIAGNOSIS — Z89511 Acquired absence of right leg below knee: Secondary | ICD-10-CM | POA: Insufficient documentation

## 2019-04-29 DIAGNOSIS — N186 End stage renal disease: Secondary | ICD-10-CM | POA: Insufficient documentation

## 2019-04-29 LAB — GLUCOSE, CAPILLARY
Glucose-Capillary: 117 mg/dL — ABNORMAL HIGH (ref 70–99)
Glucose-Capillary: 116 mg/dL — ABNORMAL HIGH (ref 70–99)
Glucose-Capillary: 224 mg/dL — ABNORMAL HIGH (ref 70–99)
Glucose-Capillary: 200 mg/dL — ABNORMAL HIGH (ref 70–99)
Glucose-Capillary: 263 mg/dL — ABNORMAL HIGH (ref 70–99)

## 2019-04-29 MED ORDER — CHLORHEXIDINE GLUCONATE CLOTH 2 % EX PADS
6.0000 | MEDICATED_PAD | Freq: Every day | CUTANEOUS | Status: DC
  Administered 2019-04-29 – 2019-05-02 (×3): 6 via TOPICAL

## 2019-04-29 MED ORDER — CHLORHEXIDINE GLUCONATE 4 % EX LIQD
60.0000 mL | Freq: Once | CUTANEOUS | Status: AC
  Administered 2019-04-29: 4 via TOPICAL
  Filled 2019-04-29: qty 60

## 2019-04-29 MED ORDER — CHLORHEXIDINE GLUCONATE 4 % EX LIQD
60.0000 mL | Freq: Once | CUTANEOUS | Status: AC
  Administered 2019-04-30: 4 via TOPICAL
  Filled 2019-04-29: qty 60

## 2019-04-29 NOTE — Significant Event (Signed)
Rapid Response Event Note  Overview: Neurological   0845: RN called, patient experiencing difficulty getting words out, new confusion.      Initial Focused Assessment: Arrived at: 0910, was on previous call.   Patient sitting in bed, on the phone with his wife on speaker phone. Upper extremities with tremors, no focal neuro deficits, Mod NIHSS = 0.  Patient and wife state he can't get words out.   Noted patient last received oxycodone 2100 9/15. Patient and wife states that patient has taken Lyrica for the past years.    Interventions: RN paged MD, ordered stat head CT  PA at bedside, per PA, tremors not new since her initial meeting with the patient. However, appreciated new expressive aphasia.    New PIV placed, patient was without IV access.   CBG 116  Plan of Care (if not transferred): Will proceed with head CT, RN/Charge RN will transport.  Will follow up with results, follow up with patient post CT.   I informed patient, and wife via phone, the plan for imaging and follow up. Both acknowledge and agree with plan.   Event Summary: End Time: 0940    Carl Best

## 2019-04-29 NOTE — Significant Event (Signed)
Rapid Response Event Note  Follow up rounding:   Patient back from CT.  Result:  CLINICAL DATA:  Altered mental status with confusion and expressive aphasia.  EXAM: CT HEAD WITHOUT CONTRAST  TECHNIQUE: Contiguous axial images were obtained from the base of the skull through the vertex without intravenous contrast.  COMPARISON:  08/22/2016  FINDINGS: Brain: Ventricles, cisterns and other CSF spaces are normal. There is no mass, mass effect, shift of midline structures or acute hemorrhage. No evidence of acute infarction.  Vascular: No hyperdense vessel or unexpected calcification.  Skull: Normal. Negative for fracture or focal lesion.  Sinuses/Orbits: No acute finding.  Other: None.  IMPRESSION: No acute findings.    Patient in bed, on the phone with his wife. Appreciate Dr Nona Dell note and plan, confusion likely medication related.   Patient stable, no significant change in assessment from 0910.  Patient without complaints, and able to communicate that he has no other needs as this time.   Call RR RN as needed.      Carl Best

## 2019-04-29 NOTE — Consult Note (Signed)
   [facility] CM Inpatient Consult   04/29/2019  [name] 1950-11-05 [id number]  Patient screened for high risk score of 22% for unplanned readmission score  And for 3 hospitalizations and 3 ED visits in the past 6 months.  Review of patient's medical record reveals patient is from History and Physical which includes but not limited to:  [name]  has presented today for surgery, with the diagnosis of LEFT LEG NONHEALING STUMP.   Patient had a significant event today, following for progress, planning for AKA noted in MD notes.  Primary Care Provider is Gar Ponto, MD   Plan:  Follow for progress with inpatient [facility] team.     Please place a [place] Management consult as appropriate and for questions contact:   Natividad Brood, RN BSN [facility] Liaison  [phone] business mobile phone Toll free office [phone]  Fax number: [phone] [email or web address]

## 2019-04-29 NOTE — Progress Notes (Signed)
Spoke with patient by phone.  He is a little more alert and less confused now.  He was given the option of an above-knee amputation by me on Friday.  However, I told him that if he wanted it done as soon as possible Dr. Donzetta Matters could do this for him tomorrow.  I assured him he would be in good hands.  I also called the hemodialysis unit at [facility] and they will dialyze him tomorrow evening after his above-knee amputation.  [name], MD [facility] [place] Office: [phone] Pager: [phone]

## 2019-04-29 NOTE — Progress Notes (Signed)
[facility] [place]  Subjective  - confused thinks he was in the OR a long time   Objective (!) 172/82 92 99.5 F (37.5 C) (Oral) 20 99%  Intake/Output Summary (Last 24 hours) at 04/29/2019 1834 Last data filed at 04/29/2019 0600 Gross per 24 hour  Intake 460 ml  Output 2075 ml  Net -1615 ml   BKA VAC in place  Alert and orient to person only No focal neuro findings just confusion Still with asterixis  Assessment/Planning: Confused most likely due to narcotic/meds Will get head CT but low likeliehood of stroke Will check stump later today after we get IV access to control pain before removing VAC Most likely will need AKA Friday.  May need to do more urgently tomorrow if wound looks bad today  [name] 04/29/2019 9:21 AM --  Laboratory Lab Results: Recent Labs    04/27/19 0949 04/27/19 1043  WBC  --  7.8  HGB 11.6* 11.3*  HCT 34.0* 36.5*  PLT  --  274   BMET Recent Labs    04/27/19 0949  NA 136  K 5.1  GLUCOSE 230*    COAG Lab Results  Component Value Date   INR 1.1 02/08/2017   INR 0.94 11/27/2010   INR 0.97 08/24/2010   No results found for: PTT

## 2019-04-29 NOTE — Progress Notes (Signed)
Visited with patient while on rounds. Patient spoke of some anxiety around his potential amputation. Prayed with the patient upon his request. Will continue to provide spiritual care as needed.   Rev. Eligha Bridegroom [place].

## 2019-04-29 NOTE — Progress Notes (Addendum)
Subjective:  Seen in room ,tolerated HD yesterday  Evening , continues  to have some Myoclonic upper extremity movements=he states  Present for several months maybe worse now .  Objective Vital signs in last 24 hours: Vitals:   04/29/19 0510 04/29/19 0539 04/29/19 0845 04/29/19 1333  BP: 136/60 119/66 (!) 172/82 (!) 157/81  Pulse: 78 90 92 83  Resp: 18 20 20 18   Temp: 98 F (36.7 C) 98.4 F (36.9 C) 99.5 F (37.5 C) 98.6 F (37 C)  TempSrc: Oral Oral Oral Axillary  SpO2: 96% 92% 99% 100%  Weight: 112.4 kg     Height:       Weight change: -1.952 kg  Physical Exam: Gen =alert, jerking mild- mod off and on Lungs=CTA , no rales, or upper airway noises Card=RRR no MRG Abd= soft ntnd no mass or ascites +bs obese Ext = no pitting LE or UE edema, R BKA wound vac in place  Neuro = alert, Ox 3 , nf, + myoclonic jerking of UE's and torso Dialysis Access =LUA AVF+bruit   Home meds:  - atorvastatin 80/ clopidogrel 75  - lisinopril 10 qd  - risperidone 0.25 qd/ pregabalin 150 tid  - cinacalcet 30 hs/ calcium acetate 4 actid  - levothyroxine 125 ug  - prn's/ vitamins/ supplements   Outpt HD: [facility]  4.5h  LUA AVF  400/600  LUE AVF/2 k/ 2 ca . edw  112 kg   Hep 2000 then 1000/hr  EPO 3000 q hd , Hec 0.5 , venofer 50  q wk   Problem/Plan:  1. SP revision of R BKA wound 9/14 with wound vac VVS managing  2. ESRD - on HD TTS / Will plan HD tomor . 3. Myoclonic jerking - suspect due to narcotics vs Risperdal (EPS) or Lyrica,? Infection also may be playing role  may improve w/ HD.and wound care RX 4. Volume - suspect mild vol overload by exam on admit tolerated  2 l uf yesterday hd / on Lisinopril 10 mg  q d get OP records in am 5. HTN - takes acei only 6. 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 H/o DM - not on any medication at this time 9. Psych - takes Risperdal   Ernest Haber,  PA-C [place] [phone] 04/29/2019,3:36 PM  LOS: 2 days   Pt seen, examined and agree w assess/plan as above with additions as indicated.  [facility] 04/29/2019, 5:19 PM     Labs: Basic Metabolic Panel: Recent Labs  Lab 04/27/19 0949  NA 136  K 5.1  GLUCOSE 230*   Liver Function Tests: No results for input(s): AST, ALT, ALKPHOS, BILITOT, PROT, ALBUMIN in the last 168 hours. No results for input(s): LIPASE, AMYLASE in the last 168 hours. No results for input(s): AMMONIA in the last 168 hours. CBC: Recent Labs  Lab 04/27/19 0949 04/27/19 1043  WBC  --  7.8  HGB 11.6* 11.3*  HCT 34.0* 36.5*  MCV  --  100.0  PLT  --  274   Cardiac Enzymes: No results for input(s): CKTOTAL, CKMB, CKMBINDEX, TROPONINI in the last 168 hours. CBG: Recent Labs  Lab 04/28/19 1647 04/28/19 2050 04/29/19 0755 04/29/19 0928 04/29/19 1200  GLUCAP 97 89 117* 116* 224*    Studies/Results: Ct Head Wo Contrast  Result Date: 04/29/2019 CLINICAL DATA:  Altered mental status with confusion and expressive aphasia. EXAM: CT HEAD WITHOUT CONTRAST TECHNIQUE: Contiguous axial images were obtained from the base of the skull through the vertex without intravenous contrast. COMPARISON:  08/22/2016 FINDINGS: Brain: Ventricles, cisterns and other CSF spaces are normal. There is no mass, mass effect, shift of midline structures or acute hemorrhage. No evidence of acute infarction. Vascular: No hyperdense vessel or unexpected calcification. Skull: Normal. Negative for fracture or focal lesion. Sinuses/Orbits: No acute finding. Other: None. IMPRESSION: No acute findings. Electronically Signed   By: Marin Olp M.D.   On: 04/29/2019 10:27   Medications: .  ceFAZolin (ANCEF) IV 1 g (04/28/19 2111)   . atorvastatin  80 mg Oral QHS  . Chlorhexidine Gluconate Cloth  6 each Topical Q0600  . Chlorhexidine Gluconate Cloth  6 each Topical Q0600  . cinacalcet  30 mg  Oral QPM  . clopidogrel  75 mg Oral Daily  . docusate sodium  100 mg Oral BID  . heparin  5,000 Units Subcutaneous Q8H  . insulin aspart  0-9 Units Subcutaneous TID WC  . ketotifen  1 drop Both Eyes BID  . levothyroxine  125 mcg Oral Q0600  . lisinopril  10 mg Oral Daily  . Melatonin  9 mg Oral QHS  . pantoprazole  40 mg Oral Daily  . potassium chloride  20-40 mEq Oral Once  . pregabalin  150 mg Oral TID  . risperiDONE  0.25 mg Oral Daily

## 2019-04-29 NOTE — Progress Notes (Signed)
   Rapid response called for altered mental status and expressive aphasia. CT of the brain demonstrated no acute infarcts.  He is a & O x3 to questions at bedside.  I spoke wit his wife and him today and he is in agreement to proceed with AKA on the right LE.  Wound vac will remain in place.   Dr. [name] has 2 options tomorrow with DR. Donzetta Matters or Friday with himself.  Dr. [name] will discuss these options with [name].    Roxy Horseman PA-C

## 2019-04-30 ENCOUNTER — Inpatient Hospital Stay (HOSPITAL_COMMUNITY): Payer: Medicare Other | Admitting: Certified Registered Nurse Anesthetist

## 2019-04-30 ENCOUNTER — Encounter (HOSPITAL_COMMUNITY)
Admission: RE | Disposition: A | Discharge status: Home Health | Payer: Self-pay | Source: Home / Self Care | Attending: Vascular Surgery

## 2019-04-30 ENCOUNTER — Encounter (HOSPITAL_COMMUNITY): Payer: Self-pay | Admitting: Orthopedic Surgery

## 2019-04-30 DIAGNOSIS — T8781 Dehiscence of amputation stump: Secondary | ICD-10-CM

## 2019-04-30 HISTORY — PX: AMPUTATION: SHX166

## 2019-04-30 LAB — GLUCOSE, CAPILLARY
Glucose-Capillary: 228 mg/dL — ABNORMAL HIGH (ref 70–99)
Glucose-Capillary: 198 mg/dL — ABNORMAL HIGH (ref 70–99)
Glucose-Capillary: 187 mg/dL — ABNORMAL HIGH (ref 70–99)
Glucose-Capillary: 234 mg/dL — ABNORMAL HIGH (ref 70–99)
Glucose-Capillary: 300 mg/dL — ABNORMAL HIGH (ref 70–99)

## 2019-04-30 LAB — CBC
HCT: 25.3 % — ABNORMAL LOW (ref 39.0–52.0)
Hemoglobin: 8.3 g/dL — ABNORMAL LOW (ref 13.0–17.0)
MCH: 31.4 pg (ref 26.0–34.0)
MCHC: 32.8 g/dL (ref 30.0–36.0)
MCV: 95.8 fL (ref 80.0–100.0)
Platelets: 183 10*3/uL (ref 150–400)
RBC: 2.64 MIL/uL — ABNORMAL LOW (ref 4.22–5.81)
RDW: 15.9 % — ABNORMAL HIGH (ref 11.5–15.5)
WBC: 5.4 10*3/uL (ref 4.0–10.5)
nRBC: 0 % (ref 0.0–0.2)

## 2019-04-30 LAB — PROTIME-INR
INR: 1.3 — ABNORMAL HIGH (ref 0.8–1.2)
Prothrombin Time: 16 seconds — ABNORMAL HIGH (ref 11.4–15.2)

## 2019-04-30 LAB — RENAL FUNCTION PANEL
Albumin: 2.6 g/dL — ABNORMAL LOW (ref 3.5–5.0)
Anion gap: 16 — ABNORMAL HIGH (ref 5–15)
BUN: 61 mg/dL — ABNORMAL HIGH (ref 8–23)
CO2: 25 mmol/L (ref 22–32)
Calcium: 8.7 mg/dL — ABNORMAL LOW (ref 8.9–10.3)
Chloride: 93 mmol/L — ABNORMAL LOW (ref 98–111)
Creatinine, Ser: 9.67 mg/dL — ABNORMAL HIGH (ref 0.61–1.24)
GFR calc Af Amer: 6 mL/min — ABNORMAL LOW (ref >60)
GFR calc non Af Amer: 5 mL/min — ABNORMAL LOW (ref >60)
Glucose, Bld: 281 mg/dL — ABNORMAL HIGH (ref 70–99)
Phosphorus: 8.9 mg/dL — ABNORMAL HIGH (ref 2.5–4.6)
Potassium: 4.7 mmol/L (ref 3.5–5.1)
Sodium: 134 mmol/L — ABNORMAL LOW (ref 135–145)

## 2019-04-30 LAB — HEPATITIS B SURFACE ANTIGEN: Hepatitis B Surface Ag: NEGATIVE

## 2019-04-30 SURGERY — AMPUTATION, ABOVE KNEE
Anesthesia: General | Site: Knee | Laterality: Right

## 2019-04-30 MED ORDER — DARBEPOETIN ALFA 60 MCG/0.3ML IJ SOSY: 60.0000 ug | PREFILLED_SYRINGE | INTRAMUSCULAR | Status: DC

## 2019-04-30 MED ORDER — HEPARIN SODIUM (PORCINE) 5000 UNIT/ML IJ SOLN
5000.0000 [IU] | Freq: Three times a day (TID) | INTRAMUSCULAR | Status: DC
  Administered 2019-05-01 – 2019-05-02 (×4): 5000 [IU] via SUBCUTANEOUS
  Filled 2019-04-30 (×4): qty 1

## 2019-04-30 MED ORDER — ONDANSETRON HCL 4 MG/2ML IJ SOLN
INTRAMUSCULAR | Status: AC
  Filled 2019-04-30: qty 2

## 2019-04-30 MED ORDER — FENTANYL CITRATE (PF) 100 MCG/2ML IJ SOLN
INTRAMUSCULAR | Status: DC | PRN
  Administered 2019-04-30 (×3): 50 ug via INTRAVENOUS

## 2019-04-30 MED ORDER — SODIUM CHLORIDE 0.9 % IV SOLN
INTRAVENOUS | Status: DC
  Administered 2019-04-30: 10 mL/h via INTRAVENOUS

## 2019-04-30 MED ORDER — OXYCODONE HCL 5 MG PO TABS: 5.0000 mg | ORAL_TABLET | Freq: Once | ORAL | Status: DC | PRN

## 2019-04-30 MED ORDER — ONDANSETRON HCL 4 MG/2ML IJ SOLN
INTRAMUSCULAR | Status: DC | PRN
  Administered 2019-04-30: 4 mg via INTRAVENOUS

## 2019-04-30 MED ORDER — MIDAZOLAM HCL 2 MG/2ML IJ SOLN
INTRAMUSCULAR | Status: AC
  Filled 2019-04-30: qty 2

## 2019-04-30 MED ORDER — 0.9 % SODIUM CHLORIDE (POUR BTL) OPTIME
TOPICAL | Status: DC | PRN
  Administered 2019-04-30: 1000 mL

## 2019-04-30 MED ORDER — FENTANYL CITRATE (PF) 100 MCG/2ML IJ SOLN: 25.0000 ug | INTRAMUSCULAR | Status: DC | PRN

## 2019-04-30 MED ORDER — ACETAMINOPHEN 500 MG PO TABS: 1000.0000 mg | ORAL_TABLET | Freq: Once | ORAL | Status: DC | PRN

## 2019-04-30 MED ORDER — CHLORHEXIDINE GLUCONATE 4 % EX LIQD
CUTANEOUS | Status: AC
  Administered 2019-04-30: 10:00:00
  Filled 2019-04-30: qty 60

## 2019-04-30 MED ORDER — PHENYLEPHRINE HCL (PRESSORS) 10 MG/ML IV SOLN
INTRAVENOUS | Status: AC
  Filled 2019-04-30: qty 1

## 2019-04-30 MED ORDER — DEXAMETHASONE SODIUM PHOSPHATE 10 MG/ML IJ SOLN
INTRAMUSCULAR | Status: AC
  Filled 2019-04-30: qty 1

## 2019-04-30 MED ORDER — FENTANYL CITRATE (PF) 100 MCG/2ML IJ SOLN
INTRAMUSCULAR | Status: AC
  Filled 2019-04-30: qty 2

## 2019-04-30 MED ORDER — CEFAZOLIN SODIUM-DEXTROSE 2-4 GM/100ML-% IV SOLN
2.0000 g | INTRAVENOUS | Status: AC
  Administered 2019-04-30: 2 g via INTRAVENOUS
  Filled 2019-04-30: qty 100

## 2019-04-30 MED ORDER — MIDAZOLAM HCL 5 MG/5ML IJ SOLN
INTRAMUSCULAR | Status: DC | PRN
  Administered 2019-04-30: 1 mg via INTRAVENOUS

## 2019-04-30 MED ORDER — FENTANYL CITRATE (PF) 100 MCG/2ML IJ SOLN
25.0000 ug | INTRAMUSCULAR | Status: DC | PRN
  Administered 2019-04-30 (×4): 25 ug via INTRAVENOUS

## 2019-04-30 MED ORDER — PROPOFOL 10 MG/ML IV BOLUS
INTRAVENOUS | Status: AC
  Filled 2019-04-30: qty 20

## 2019-04-30 MED ORDER — SODIUM CHLORIDE 0.9 % IV SOLN
INTRAVENOUS | Status: DC
  Administered 2019-04-30: 11:00:00 via INTRAVENOUS

## 2019-04-30 MED ORDER — PHENYLEPHRINE 40 MCG/ML (10ML) SYRINGE FOR IV PUSH (FOR BLOOD PRESSURE SUPPORT)
PREFILLED_SYRINGE | INTRAVENOUS | Status: DC | PRN
  Administered 2019-04-30 (×2): 80 ug via INTRAVENOUS

## 2019-04-30 MED ORDER — OXYCODONE HCL 5 MG/5ML PO SOLN: 5.0000 mg | Freq: Once | ORAL | Status: DC | PRN

## 2019-04-30 MED ORDER — DEXAMETHASONE SODIUM PHOSPHATE 10 MG/ML IJ SOLN
INTRAMUSCULAR | Status: DC | PRN
  Administered 2019-04-30: 4 mg via INTRAVENOUS

## 2019-04-30 MED ORDER — CALCIUM ACETATE (PHOS BINDER) 667 MG PO CAPS
2001.0000 mg | ORAL_CAPSULE | Freq: Three times a day (TID) | ORAL | Status: DC
  Administered 2019-05-01 – 2019-05-02 (×3): 2001 mg via ORAL
  Filled 2019-04-30 (×4): qty 3

## 2019-04-30 MED ORDER — ACETAMINOPHEN 160 MG/5ML PO SOLN: 1000.0000 mg | Freq: Once | ORAL | Status: DC | PRN

## 2019-04-30 MED ORDER — PROPOFOL 10 MG/ML IV BOLUS
INTRAVENOUS | Status: DC | PRN
  Administered 2019-04-30: 120 mg via INTRAVENOUS

## 2019-04-30 MED ORDER — FENTANYL CITRATE (PF) 250 MCG/5ML IJ SOLN
INTRAMUSCULAR | Status: AC
  Filled 2019-04-30: qty 5

## 2019-04-30 MED ORDER — SODIUM CHLORIDE 0.9 % IV SOLN
INTRAVENOUS | Status: DC | PRN
  Administered 2019-04-30: 30 ug/min via INTRAVENOUS

## 2019-04-30 MED ORDER — PHENYLEPHRINE 40 MCG/ML (10ML) SYRINGE FOR IV PUSH (FOR BLOOD PRESSURE SUPPORT)
PREFILLED_SYRINGE | INTRAVENOUS | Status: AC
  Filled 2019-04-30: qty 10

## 2019-04-30 MED ORDER — ACETAMINOPHEN 10 MG/ML IV SOLN: 1000.0000 mg | Freq: Once | INTRAVENOUS | Status: DC | PRN

## 2019-04-30 MED ORDER — LIDOCAINE 2% (20 MG/ML) 5 ML SYRINGE
INTRAMUSCULAR | Status: DC | PRN
  Administered 2019-04-30: 60 mg via INTRAVENOUS

## 2019-04-30 SURGICAL SUPPLY — 56 items
BAG ISOLATION DRAPE 18X18 (DRAPES) IMPLANT
BANDAGE ESMARK 6X9 LF (GAUZE/BANDAGES/DRESSINGS) ×1 IMPLANT
BLADE SAGITTAL (BLADE)
BLADE SAGITTAL 25.0X1.19X90 (BLADE) IMPLANT
BLADE SAW GIGLI 510 (BLADE) ×2 IMPLANT
BLADE SAW THK.89X75X18XSGTL (BLADE) IMPLANT
BNDG COHESIVE 6X5 TAN STRL LF (GAUZE/BANDAGES/DRESSINGS) ×2 IMPLANT
BNDG ELASTIC 4X5.8 VLCR STR LF (GAUZE/BANDAGES/DRESSINGS) ×2 IMPLANT
BNDG ELASTIC 6X5.8 VLCR STR LF (GAUZE/BANDAGES/DRESSINGS) ×2 IMPLANT
BNDG ESMARK 6X9 LF (GAUZE/BANDAGES/DRESSINGS) ×2
BNDG GAUZE ELAST 4 BULKY (GAUZE/BANDAGES/DRESSINGS) ×2 IMPLANT
CANISTER SUCT 3000ML PPV (MISCELLANEOUS) ×2 IMPLANT
CLIP VESOCCLUDE MED 6/CT (CLIP) ×2 IMPLANT
COVER SURGICAL LIGHT HANDLE (MISCELLANEOUS) ×2 IMPLANT
COVER WAND RF STERILE (DRAPES) ×1 IMPLANT
CUFF TOURN SGL QUICK 24 (TOURNIQUET CUFF)
CUFF TOURN SGL QUICK 34 (TOURNIQUET CUFF) ×1
CUFF TRNQT CYL 24X4X16.5-23 (TOURNIQUET CUFF) IMPLANT
CUFF TRNQT CYL 34X4.125X (TOURNIQUET CUFF) IMPLANT
DRAIN CHANNEL 19F RND (DRAIN) IMPLANT
DRAPE HALF SHEET 40X57 (DRAPES) ×2 IMPLANT
DRAPE INCISE IOBAN 66X45 STRL (DRAPES) ×1 IMPLANT
DRAPE ISOLATION BAG 18X18 (DRAPES) ×1
DRAPE ORTHO SPLIT 77X108 STRL (DRAPES) ×2
DRAPE SURG ORHT 6 SPLT 77X108 (DRAPES) ×2 IMPLANT
DRSG ADAPTIC 3X8 NADH LF (GAUZE/BANDAGES/DRESSINGS) ×2 IMPLANT
ELECT CAUTERY BLADE 6.4 (BLADE) ×2 IMPLANT
ELECT REM PT RETURN 9FT ADLT (ELECTROSURGICAL) ×2
ELECTRODE REM PT RTRN 9FT ADLT (ELECTROSURGICAL) ×1 IMPLANT
EVACUATOR SILICONE 100CC (DRAIN) IMPLANT
GAUZE SPONGE 4X4 12PLY STRL (GAUZE/BANDAGES/DRESSINGS) ×2 IMPLANT
GAUZE SPONGE 4X4 12PLY STRL LF (GAUZE/BANDAGES/DRESSINGS) ×1 IMPLANT
GLOVE BIO SURGEON STRL SZ7.5 (GLOVE) ×3 IMPLANT
GLOVE ECLIPSE 7.0 STRL STRAW (GLOVE) ×1 IMPLANT
GLOVE ECLIPSE 7.5 STRL STRAW (GLOVE) ×1 IMPLANT
GOWN STRL REUS W/ TWL LRG LVL3 (GOWN DISPOSABLE) ×2 IMPLANT
GOWN STRL REUS W/ TWL XL LVL3 (GOWN DISPOSABLE) ×1 IMPLANT
GOWN STRL REUS W/TWL LRG LVL3 (GOWN DISPOSABLE) ×2
GOWN STRL REUS W/TWL XL LVL3 (GOWN DISPOSABLE) ×2
KIT BASIN OR (CUSTOM PROCEDURE TRAY) ×2 IMPLANT
KIT TURNOVER KIT B (KITS) ×2 IMPLANT
NS IRRIG 1000ML POUR BTL (IV SOLUTION) ×2 IMPLANT
PACK GENERAL/GYN (CUSTOM PROCEDURE TRAY) ×2 IMPLANT
PAD ARMBOARD 7.5X6 YLW CONV (MISCELLANEOUS) ×4 IMPLANT
STAPLER VISISTAT 35W (STAPLE) ×2 IMPLANT
STOCKINETTE IMPERVIOUS LG (DRAPES) ×2 IMPLANT
SUT ETHILON 3 0 PS 1 (SUTURE) IMPLANT
SUT SILK 0 TIES 10X30 (SUTURE) ×2 IMPLANT
SUT SILK 2 0 (SUTURE) ×1
SUT SILK 2-0 18XBRD TIE 12 (SUTURE) ×1 IMPLANT
SUT SILK 3 0 (SUTURE)
SUT SILK 3-0 18XBRD TIE 12 (SUTURE) IMPLANT
SUT VIC AB 2-0 CT1 18 (SUTURE) ×5 IMPLANT
TOWEL GREEN STERILE (TOWEL DISPOSABLE) ×4 IMPLANT
UNDERPAD 30X30 (UNDERPADS AND DIAPERS) ×2 IMPLANT
WATER STERILE IRR 1000ML POUR (IV SOLUTION) ×2 IMPLANT

## 2019-04-30 NOTE — Anesthesia Preprocedure Evaluation (Signed)
Anesthesia Evaluation  Patient identified by MRN, date of birth, ID band Patient awake    Reviewed: Allergy & Precautions, NPO status , Patient's Chart, lab work & pertinent test results  History of Anesthesia Complications Negative for: history of anesthetic complications  Airway Mallampati: II  TM Distance: >3 FB Neck ROM: Full    Dental  (+) Dental Advisory Given   Pulmonary shortness of breath, COPD, Current Smoker,    breath sounds clear to auscultation       Cardiovascular hypertension, + Peripheral Vascular Disease   Rhythm:Regular     Neuro/Psych  Headaches, PSYCHIATRIC DISORDERS Anxiety Depression  Neuromuscular disease    GI/Hepatic Neg liver ROS, hiatal hernia, GERD  ,  Endo/Other  diabetes  Renal/GU ESRF and DialysisRenal disease     Musculoskeletal  (+) Arthritis ,   Abdominal   Peds  Hematology  (+) Blood dyscrasia, anemia ,   Anesthesia Other Findings   Reproductive/Obstetrics                             Anesthesia Physical Anesthesia Plan  ASA: III  Anesthesia Plan: General   Post-op Pain Management:    Induction: Intravenous  PONV Risk Score and Plan: 1 and Ondansetron and Dexamethasone  Airway Management Planned: Oral ETT and LMA  Additional Equipment: None  Intra-op Plan:   Post-operative Plan: Extubation in OR  Informed Consent: I have reviewed the patients History and Physical, chart, labs and discussed the procedure including the risks, benefits and alternatives for the proposed anesthesia with the patient or authorized representative who has indicated his/her understanding and acceptance.     Dental advisory given  Plan Discussed with: CRNA and Surgeon  Anesthesia Plan Comments:         Anesthesia Quick Evaluation

## 2019-04-30 NOTE — Progress Notes (Signed)
Patient off unit to OR for procedure.

## 2019-04-30 NOTE — Op Note (Signed)
    Patient name: [name] MRN: [id number] DOB: 12-07-1950 Sex: male  04/30/2019 Pre-operative Diagnosis: Nonhealing right below-knee amputation Post-operative diagnosis:  Same Surgeon:  Erlene Quan C. Donzetta Matters, MD Assistant: Arlee Muslim, PA Procedure Performed:  Right above-knee amputation  Indications: 68 year old male has undergone right below-knee amputation as well as revision.  This is nonhealing is no indicated for conversion to above-knee amputation.  Findings: There is adequate bleeding in the wound to suggest healing   Procedure:  The patient was identified in the holding area and taken to the operating room where is placed supine operative general anesthesia induced.  Antibiotics were administered timeout was called.  History of prepped draped the right lower extremity usual fashion the previous wound was covered.  Tourniquet was placed above the knee.  This is inflated 250 mmHg.  We began with fishmouth type incision above the knee.  We dissected down to the level of bone.  This was transected with Gigli saw.  The neurovascular bundle was clamped and divided.  Posterior flap was created with amputation knife.  Tourniquet was allowed down.  We obtain hemostasis.  The bone was smoothed with rasp.  We thoroughly irrigated the wound.  We reapproximated the flaps with interrupted 2-0 Vicryl suture.  Staples were placed to level the skin.  He was awakened anesthesia having tolerated procedure well to make application.  All counts were correct at completion.  EBL: 100 cc   Hatsumi Steinhart C. Donzetta Matters, MD [facility] [place] Office: [phone] Pager: [phone]

## 2019-04-30 NOTE — Transfer of Care (Signed)
Immediate Anesthesia Transfer of Care Note  Patient: [name]  Procedure(s) Performed: AMPUTATION ABOVE KNEE - RIGHT (Right Knee)  Patient Location: PACU  Anesthesia Type:General  Level of Consciousness: drowsy and patient cooperative  Airway & Oxygen Therapy: Patient Spontanous Breathing and Patient connected to nasal cannula oxygen  Post-op Assessment: Report given to RN and Post -op Vital signs reviewed and stable  Post vital signs: Reviewed and stable  Last Vitals:  Vitals Value Taken Time  BP 120/66 04/30/19 1232  Temp    Pulse 74 04/30/19 1235  Resp 15 04/30/19 1235  SpO2 100 % 04/30/19 1235  Vitals shown include unvalidated device data.  Last Pain:  Vitals:   04/30/19 0746  TempSrc:   PainSc: 2       Patients Stated Pain Goal: 2 (80/88/11 0315)  Complications: No apparent anesthesia complications

## 2019-04-30 NOTE — Progress Notes (Signed)
  Progress Note    04/30/2019 10:54 AM Day of Surgery  Subjective: Still having pain right below-knee amputation site  Vitals:   04/29/19 2157 04/30/19 0439  BP: (!) 143/70 (!) 145/75  Pulse: 80 78  Resp: 18 18  Temp: 97.8 F (36.6 C) 98.2 F (36.8 C)  SpO2: 99% 100%    Physical Exam: Currently is awake alert and oriented Right below-knee incision with wound VAC in place  CBC    Component Value Date/Time   WBC 5.4 04/30/2019 0111   RBC 2.64 (L) 04/30/2019 0111   HGB 8.3 (L) 04/30/2019 0111   HGB 11.5 (L) 02/08/2017 1409   HCT 25.3 (L) 04/30/2019 0111   HCT 34.3 (L) 02/08/2017 1409   PLT 183 04/30/2019 0111   PLT 208 02/08/2017 1409   MCV 95.8 04/30/2019 0111   MCV 94 02/08/2017 1409   MCH 31.4 04/30/2019 0111   MCHC 32.8 04/30/2019 0111   RDW 15.9 (H) 04/30/2019 0111   RDW 16.7 (H) 02/08/2017 1409   LYMPHSABS 1.5 12/11/2018 1425   LYMPHSABS 2.1 02/08/2017 1409   MONOABS 0.7 12/11/2018 1425   EOSABS 0.3 12/11/2018 1425   EOSABS 0.2 02/08/2017 1409   BASOSABS 0.0 12/11/2018 1425   BASOSABS 0.0 02/08/2017 1409    BMET    Component Value Date/Time   NA 134 (L) 04/30/2019 0111   NA 134 02/08/2017 1403   K 4.7 04/30/2019 0111   CL 93 (L) 04/30/2019 0111   CO2 25 04/30/2019 0111   GLUCOSE 281 (H) 04/30/2019 0111   BUN 61 (H) 04/30/2019 0111   BUN 67 (H) 02/08/2017 1403   CREATININE 9.67 (H) 04/30/2019 0111   CALCIUM 8.7 (L) 04/30/2019 0111   GFRNONAA 5 (L) 04/30/2019 0111   GFRAA 6 (L) 04/30/2019 0111    INR    Component Value Date/Time   INR 1.3 (H) 04/30/2019 0111     Intake/Output Summary (Last 24 hours) at 04/30/2019 1054 Last data filed at 04/30/2019 0700 Gross per 24 hour  Intake 480 ml  Output 125 ml  Net 355 ml     Assessment/plan:  68 y.o. male is here with nonhealing below-knee amputation site.  We will plan for conversion to above-knee today in OR.  I discussed risk benefits alternatives and consent is signed.   Rozella Servello C. Donzetta Matters,  MD [facility] [place] Office: [phone] Pager: [phone]  04/30/2019 10:54 AM \

## 2019-04-30 NOTE — Care Management Important Message (Signed)
Important Message  Patient Details  Name: MONTY SPICHER MRN: [id number] Date of Birth: 1951-04-22   Medicare Important Message Given:  Yes     Shelda Altes 04/30/2019, 4:09 PM

## 2019-04-30 NOTE — Progress Notes (Signed)
Pt received from PACU. VSS. Compression wrap on R AKA, clean dry and intact. Pt oriented to room and unit. Call light in reach.  Clyde Canterbury, RN

## 2019-04-30 NOTE — Progress Notes (Signed)
Inpatient Diabetes Program Recommendations  AACE/ADA: New Consensus Statement on Inpatient Glycemic Control (2015)  Target Ranges:  Prepandial:   less than 140 mg/dL      Peak postprandial:   less than 180 mg/dL (1-2 hours)      Critically ill patients:  140 - 180 mg/dL   Lab Results  Component Value Date   GLUCAP 228 (H) 04/30/2019   HGBA1C (H) 11/27/2010    11.2 (NOTE)                                                                       According to the ADA Clinical Practice Recommendations for 2011, when HbA1c is used as a screening test:   >=6.5%   Diagnostic of Diabetes Mellitus           (if abnormal result  is confirmed)  5.7-6.4%   Increased risk of developing Diabetes Mellitus  References:Diagnosis and Classification of Diabetes Mellitus,Diabetes QMVH,8469,62(XBMWU 1):S62-S69 and Standards of Medical Care in         Diabetes - 2011,Diabetes Care,2011,34  (Suppl 1):S11-S61.    Review of Glycemic Control Results for SIE, FORMISANO (MRN [id number]) as of 04/30/2019 10:31  Ref. Range 04/29/2019 12:00 04/29/2019 18:01 04/29/2019 21:45 04/30/2019 08:18  Glucose-Capillary Latest Ref Range: 70 - 99 mg/dL 224 (H) 200 (H) 263 (H) 228 (H)   Diabetes history: Type 2 DM Outpatient Diabetes medications: Toujeo 54-84 units QHS PRN for CBGs >140 mg/dL Current orders for Inpatient glycemic control: Lantus 54 units QHS PRN  Inpatient Diabetes Program Recommendations:    -Discontinue Lantus 54 units QHS PRN. When patient is NPO, glucose trends are in the 80-90's mg/dL. Surgery scheduled for 9/18. Noted elevations follow post prandial trend. Will follow.   Thanks, Bronson Curb, MSN, RNC-OB Diabetes Coordinator [phone] (8a-5p)

## 2019-04-30 NOTE — Anesthesia Postprocedure Evaluation (Signed)
Anesthesia Post Note  Patient: [name]  Procedure(s) Performed: AMPUTATION ABOVE KNEE - RIGHT (Right Knee)     Patient location during evaluation: PACU Anesthesia Type: General Level of consciousness: awake and alert Pain management: pain level controlled Vital Signs Assessment: post-procedure vital signs reviewed and stable Respiratory status: spontaneous breathing, nonlabored ventilation, respiratory function stable and patient connected to nasal cannula oxygen Cardiovascular status: blood pressure returned to baseline and stable Postop Assessment: no apparent nausea or vomiting Anesthetic complications: no    Last Vitals:  Vitals:   04/30/19 1503 04/30/19 1532  BP: (!) 148/71 (!) 158/71  Pulse: 72 79  Resp: 14 (!) 22  Temp: 36.7 C 36.7 C  SpO2: 100% 93%    Last Pain:  Vitals:   04/30/19 1631  TempSrc:   PainSc: 0-No pain                 Sheelah Ritacco

## 2019-04-30 NOTE — Anesthesia Procedure Notes (Signed)
Procedure Name: LMA Insertion Date/Time: 04/30/2019 11:42 AM Performed by: Colin Benton, CRNA Pre-anesthesia Checklist: Patient identified, Emergency Drugs available, Suction available and Patient being monitored Patient Re-evaluated:Patient Re-evaluated prior to induction Oxygen Delivery Method: Circle system utilized Preoxygenation: Pre-oxygenation with 100% oxygen Induction Type: IV induction Ventilation: Mask ventilation without difficulty LMA: LMA inserted LMA Size: 5.0 Number of attempts: 1 Placement Confirmation: positive ETCO2 Tube secured with: Tape Dental Injury: Teeth and Oropharynx as per pre-operative assessment

## 2019-04-30 NOTE — Progress Notes (Addendum)
Subjective:  Seen in room , less myoclonic movements / for HD after R  AKA   Objective Vital signs in last 24 hours: Vitals:   04/30/19 1246 04/30/19 1247 04/30/19 1300 04/30/19 1303  BP:  132/80  (!) 145/70  Pulse: 72 73 72 71  Resp:   13 13  Temp:      TempSrc:      SpO2: 99% 100% 98% 100%  Weight:      Height:       Weight change:   Physical Exam: Gen=alert, less jerking mild- mod off and on Lungs=CTA , no rales, or upper airway noises Card=RRR no MRG Abd= soft ntnd no mass or ascites +bsobese Ext= no pitting LE or UEedema, R BKA wound vac in place  Neuro = alert, Ox 3 , nf, + myoclonic jerking of UE's and torso Dialysis Access =LUA AVF+bruit  Home meds: - atorvastatin 80/ clopidogrel 75 - lisinopril 10 qd - risperidone 0.25 qd/ pregabalin 150 tid - cinacalcet 30 hs/ calcium acetate 4 actid - levothyroxine 125 ug - prn's/ vitamins/ supplements  [facility]pt ER:[facility] 4.5[facility]  LUA AVF 400/600 LUE AVF/2 k/ 2 ca . edw  112 kg Hep 2000 then 1000/hr  EPO 3000 q hd , Hec 0.5 , venofer 50  q wk   Problem/Plan:  1. 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 ESRD - on HD TTS / HD today after AKA 3. Myoclonic jerking - suspect due to narcotics vs Risperdal (EPS) or Lyrica,? Infection also may be playing role  Pt agrees to trial off Lyrica 48 hrs for myoclonic jerking.  4. Volume - on admit  mild vol overload by exam ,is tolerating uf 2 l uf yesterday  / on Lisinopril 10 mg  q d bp stable / today 1-2 l uf (lower edw at dc with AKA  After today . ) 5. HTN - takes acei only 6. 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 H/o DM - not on any medication at this time 9. Psych - takes Risperdal  Ernest Haber, PA-C [place]  [phone] 04/30/2019,2:03 PM  LOS: 3 days   Pt seen, examined and agree w assess/plan as above with additions as indicated.  [facility] 04/30/2019, 2:45 PM       Labs: Basic Metabolic Panel: Recent Labs  Lab 04/27/19 0949 04/30/19 0111  NA 136 134*  K 5.1 4.7  CL  --  93*  CO2  --  25  GLUCOSE 230* 281*  BUN  --  61*  CREATININE  --  9.67*  CALCIUM  --  8.7*  PHOS  --  8.9*   Liver Function Tests: Recent Labs  Lab 04/30/19 0111  ALBUMIN 2.6*   No results for input(s): LIPASE, AMYLASE in the last 168 hours. No results for input(s): AMMONIA in the last 168 hours. CBC: Recent Labs  Lab 04/27/19 0949 04/27/19 1043 04/30/19 0111  WBC  --  7.8 5.4  HGB 11.6* 11.3* 8.3*  HCT 34.0* 36.5* 25.3*  MCV  --  100.0 95.8  PLT  --  274 183   Cardiac Enzymes:  No results for input(s): CKTOTAL, CKMB, CKMBINDEX, TROPONINI in the last 168 hours. CBG: Recent Labs  Lab 04/29/19 1801 04/29/19 2145 04/30/19 0818 04/30/19 1058 04/30/19 1242  GLUCAP 200* 263* 228* 198* 187*    Studies/Results: Ct Head Wo Contrast  Result Date: 04/29/2019 CLINICAL DATA:  Altered mental status with confusion and expressive aphasia. EXAM: CT HEAD WITHOUT CONTRAST TECHNIQUE: Contiguous axial images were obtained from the base of the skull through the vertex without intravenous contrast. COMPARISON:  08/22/2016 FINDINGS: Brain: Ventricles, cisterns and other CSF spaces are normal. There is no mass, mass effect, shift of midline structures or acute hemorrhage. No evidence of acute infarction. Vascular: No hyperdense vessel or unexpected calcification. Skull: Normal. Negative for fracture or focal lesion. Sinuses/Orbits: No acute finding. Other: None. IMPRESSION: No acute findings. Electronically Signed   By: Marin Olp M.D.   On: 04/29/2019 10:27   Medications: . sodium chloride 10 mL/hr (04/30/19 1005)  . sodium chloride 10 mL/hr at 04/30/19 1134  . acetaminophen    .  [MAR Hold]  ceFAZolin (ANCEF) IV 1 g (04/29/19 2015)   . [MAR Hold] atorvastatin  80 mg Oral QHS  . [MAR Hold] Chlorhexidine Gluconate Cloth  6 each Topical Q0600  . [MAR Hold] cinacalcet  30 mg Oral QPM  . [MAR Hold] clopidogrel  75 mg Oral Daily  . [MAR Hold] docusate sodium  100 mg Oral BID  . fentaNYL      . [MAR Hold] heparin  5,000 Units Subcutaneous Q8H  . [MAR Hold] insulin aspart  0-9 Units Subcutaneous TID WC  . [MAR Hold] ketotifen  1 drop Both Eyes BID  . [MAR Hold] levothyroxine  125 mcg Oral Q0600  . [MAR Hold] lisinopril  10 mg Oral Daily  . [MAR Hold] Melatonin  9 mg Oral QHS  . [MAR Hold] pantoprazole  40 mg Oral Daily  . [MAR Hold] potassium chloride  20-40 mEq Oral Once  . [MAR Hold] pregabalin  150 mg Oral TID  . [MAR Hold] risperiDONE  0.25 mg Oral Daily

## 2019-05-01 ENCOUNTER — Encounter (HOSPITAL_COMMUNITY): Payer: Self-pay | Admitting: Vascular Surgery

## 2019-05-01 LAB — RENAL FUNCTION PANEL
Albumin: 2.7 g/dL — ABNORMAL LOW (ref 3.5–5.0)
Anion gap: 20 — ABNORMAL HIGH (ref 5–15)
BUN: 81 mg/dL — ABNORMAL HIGH (ref 8–23)
CO2: 22 mmol/L (ref 22–32)
Calcium: 8.7 mg/dL — ABNORMAL LOW (ref 8.9–10.3)
Chloride: 91 mmol/L — ABNORMAL LOW (ref 98–111)
Creatinine, Ser: 11.81 mg/dL — ABNORMAL HIGH (ref 0.61–1.24)
GFR calc Af Amer: 5 mL/min — ABNORMAL LOW (ref >60)
GFR calc non Af Amer: 4 mL/min — ABNORMAL LOW (ref >60)
Glucose, Bld: 240 mg/dL — ABNORMAL HIGH (ref 70–99)
Phosphorus: 11.6 mg/dL — ABNORMAL HIGH (ref 2.5–4.6)
Potassium: 6.6 mmol/L (ref 3.5–5.1)
Sodium: 133 mmol/L — ABNORMAL LOW (ref 135–145)

## 2019-05-01 LAB — CBC
HCT: 22.7 % — ABNORMAL LOW (ref 39.0–52.0)
Hemoglobin: 7.2 g/dL — ABNORMAL LOW (ref 13.0–17.0)
MCH: 30.5 pg (ref 26.0–34.0)
MCHC: 31.7 g/dL (ref 30.0–36.0)
MCV: 96.2 fL (ref 80.0–100.0)
Platelets: 206 10*3/uL (ref 150–400)
RBC: 2.36 MIL/uL — ABNORMAL LOW (ref 4.22–5.81)
RDW: 15.2 % (ref 11.5–15.5)
WBC: 4.2 10*3/uL (ref 4.0–10.5)
nRBC: 0 % (ref 0.0–0.2)

## 2019-05-01 LAB — GLUCOSE, CAPILLARY
Glucose-Capillary: 241 mg/dL — ABNORMAL HIGH (ref 70–99)
Glucose-Capillary: 213 mg/dL — ABNORMAL HIGH (ref 70–99)
Glucose-Capillary: 163 mg/dL — ABNORMAL HIGH (ref 70–99)
Glucose-Capillary: 156 mg/dL — ABNORMAL HIGH (ref 70–99)
Glucose-Capillary: 161 mg/dL — ABNORMAL HIGH (ref 70–99)

## 2019-05-01 MED ORDER — OXYCODONE HCL 5 MG PO TABS
ORAL_TABLET | ORAL | Status: AC
  Filled 2019-05-01: qty 2

## 2019-05-01 MED ORDER — DARBEPOETIN ALFA 60 MCG/0.3ML IJ SOSY: 60.0000 ug | PREFILLED_SYRINGE | INTRAMUSCULAR | Status: DC

## 2019-05-01 MED ORDER — OXYCODONE HCL 5 MG PO TABS: 5.0000 mg | ORAL_TABLET | Freq: Four times a day (QID) | ORAL | 0 refills | Status: DC | PRN

## 2019-05-01 MED ORDER — DARBEPOETIN ALFA 60 MCG/0.3ML IJ SOSY
60.0000 ug | PREFILLED_SYRINGE | INTRAMUSCULAR | Status: AC
  Administered 2019-05-01: 60 ug via INTRAVENOUS

## 2019-05-01 MED ORDER — DARBEPOETIN ALFA 60 MCG/0.3ML IJ SOSY
PREFILLED_SYRINGE | INTRAMUSCULAR | Status: AC
  Filled 2019-05-01: qty 0.3

## 2019-05-01 NOTE — Discharge Summary (Signed)
Physician Discharge Summary   Patient ID: [name] [id number] 68 y.o. 11/17/1950  Admit date: 04/27/2019  Discharge date and time: 05/02/19   Admitting Physician: [name], MD   Discharge Physician: same  Admission Diagnoses: LEFT LEG NONHEALING STUMP  Discharge Diagnoses: s/p AKA  Admission Condition: fair  Discharged Condition: fair  Indication for Admission: Nonhealing surgical wound  Hospital Course: [name] is a 68 year old male who was brought in as an outpatient for debridement of right nonhealing below the knee amputation and placement of wound VAC by Dr. [name] on 04/27/2019.  He tolerated the procedure well and was admitted to the hospital postoperatively.  Nephrology was consulted for management of end-stage renal disease on hemodialysis during hospitalization.  Due to extensive muscle necrosis and lengthy estimated recovery time patient eventually consented to right above-the-knee amputation.  This was performed by Dr. Donzetta Matters on 04/30/2019.  Case manager was consulted to resume home health physical therapy.  The remainder of hospital stay consisted of controlling postoperative pain.  He will follow-up in office in 4 to 6 weeks for staple removal.  He will be discharged home with home health in stable condition.  Consults: nephrology  Treatments: surgery: Right BKA debridement with VAC placement by Dr. [name] on 04/27/2019.  Right AKA by Dr. Donzetta Matters on 04/30/2019  Discharge Exam: See progress note 05/02/2019 Vitals:   05/01/19 0930 05/01/19 1000  BP: (!) (P) 103/52 (!) (P) 90/48  Pulse: (P) 62 (P) 61  Resp: (P) 12 (P) 13  Temp:    SpO2:       Disposition: Discharge disposition: 01-Home or Self Care       Patient Instructions:  Allergies as of 05/01/2019   No Known Allergies     Medication List    TAKE these medications   ARTIFICIAL TEAR OP Place 1 drop into both eyes every 6 (six) hours as needed (dry eyes).   atorvastatin 80 MG  tablet Commonly known as: LIPITOR Take 1 tablet (80 mg total) by mouth at bedtime. What changed: when to take this   bismuth subsalicylate 409 WJ/19JY suspension Commonly known as: PEPTO BISMOL Take 30 mLs by mouth every 6 (six) hours as needed for indigestion.   calcium acetate 667 MG capsule Commonly known as: PHOSLO Take 4 capsules (2,668 mg total) by mouth 3 (three) times daily with meals. What changed:   how much to take  when to take this  additional instructions   cephALEXin 500 MG capsule Commonly known as: KEFLEX Take 1 capsule (500 mg total) by mouth 2 (two) times daily.   cinacalcet 30 MG tablet Commonly known as: SENSIPAR Take 1 tablet (30 mg total) by mouth every evening.   clopidogrel 75 MG tablet Commonly known as: PLAVIX Take 1 tablet (75 mg total) by mouth daily.   collagenase ointment Commonly known as: SANTYL Apply 1 application topically daily as needed (wound care).   diphenhydrAMINE 25 MG tablet Commonly known as: BENADRYL Take 25 mg by mouth daily as needed for itching.   ibuprofen 200 MG tablet Commonly known as: ADVIL Take 800 mg by mouth 3 (three) times daily as needed for headache or moderate pain.   levothyroxine 125 MCG tablet Commonly known as: SYNTHROID Take 1 tablet (125 mcg total) by mouth daily before breakfast. What changed: when to take this   lisinopril 10 MG tablet Commonly known as: ZESTRIL Take 10 mg by mouth daily.   Melatonin 10 MG Caps Take 10 mg by mouth  at bedtime.   oxyCODONE 5 MG immediate release tablet Commonly known as: Oxy IR/ROXICODONE Take 1 tablet (5 mg total) by mouth every 6 (six) hours as needed for moderate pain.   polyethylene glycol 17 g packet Commonly known as: MIRALAX / GLYCOLAX Take 17 g by mouth daily as needed for moderate constipation.   pregabalin 150 MG capsule Commonly known as: LYRICA Take 1 capsule (150 mg total) by mouth 3 (three) times daily.   risperiDONE 0.25 MG  tablet Commonly known as: RISPERDAL Take 1 tablet (0.25 mg total) by mouth daily.   Toujeo SoloStar 300 UNIT/ML Sopn Generic drug: Insulin Glargine (1 Unit Dial) Inject 54-84 Units as directed at bedtime as needed (if blood sugar is 140 or higher).      Activity: activity as tolerated Diet: regular diet Wound Care: keep wound clean and dry  Follow-up with Dr. [name] in 4 weeks.  SignedDagoberto Ligas 05/01/2019 11:14 AM

## 2019-05-01 NOTE — Discharge Instructions (Signed)
Incision Care, Adult °An incision is a cut that a doctor makes in your skin for surgery (for a procedure). Most times, these cuts are closed after surgery. Your cut from surgery may be closed with stitches (sutures), staples, skin glue, or skin tape (adhesive strips). You may need to return to your doctor to have stitches or staples taken out. This may happen many days or many weeks after your surgery. The cut needs to be well cared for so it does not get infected. °How to care for your cut °Cut care ° °· Follow instructions from your doctor about how to take care of your cut. Make sure you: °? Wash your hands with soap and water before you change your bandage (dressing). If you cannot use soap and water, use hand sanitizer. °? Change your bandage as told by your doctor. °? Leave stitches, skin glue, or skin tape in place. They may need to stay in place for 2 weeks or longer. If tape strips get loose and curl up, you may trim the loose edges. Do not remove tape strips completely unless your doctor says it is okay. °· Check your cut area every day for signs of infection. Check for: °? More redness, swelling, or pain. °? More fluid or blood. °? Warmth. °? Pus or a bad smell. °· Ask your doctor how to clean the cut. This may include: °? Using mild soap and water. °? Using a clean towel to pat the cut dry after you clean it. °? Putting a cream or ointment on the cut. Do this only as told by your doctor. °? Covering the cut with a clean bandage. °· Ask your doctor when you can leave the cut uncovered. °· Do not take baths, swim, or use a hot tub until your doctor says it is okay. Ask your doctor if you can take showers. You may only be allowed to take sponge baths for bathing. °Medicines °· If you were prescribed an antibiotic medicine, cream, or ointment, take the antibiotic or put it on the cut as told by your doctor. Do not stop taking or putting on the antibiotic even if your condition gets better. °· Take  over-the-counter and prescription medicines only as told by your doctor. °General instructions °· Limit movement around your cut. This helps healing. °? Avoid straining, lifting, or exercise for the first month, or for as long as told by your doctor. °? Follow instructions from your doctor about going back to your normal activities. °? Ask your doctor what activities are safe. °· Protect your cut from the sun when you are outside for the first 6 months, or for as long as told by your doctor. Put on sunscreen around the scar or cover up the scar. °· Keep all follow-up visits as told by your doctor. This is important. °Contact a doctor if: °· Your have more redness, swelling, or pain around the cut. °· You have more fluid or blood coming from the cut. °· Your cut feels warm to the touch. °· You have pus or a bad smell coming from the cut. °· You have a fever or shaking chills. °· You feel sick to your stomach (nauseous) or you throw up (vomit). °· You are dizzy. °· Your stitches or staples come undone. °Get help right away if: °· You have a red streak coming from your cut. °· Your cut bleeds through the bandage and the bleeding does not stop with gentle pressure. °· The edges of your cut   open up and separate. °· You have very bad (severe) pain. °· You have a rash. °· You are confused. °· You pass out (faint). °· You have trouble breathing and you have a fast heartbeat. °This information is not intended to replace advice given to you by your health care provider. Make sure you discuss any questions you have with your health care provider. °Document Released: 10/22/2011 Document Revised: 12/17/2016 Document Reviewed: 04/06/2016 °Elsevier Patient Education © 2020 Else[employer] Inc. ° °

## 2019-05-01 NOTE — Progress Notes (Addendum)
  Progress Note    05/01/2019 9:32 AM 1 Day Post-Op  Subjective:  Tolerating pain with p.o. medication. Feels ok to go home with [facility] tomorrow   Vitals:   05/01/19 0750 05/01/19 0830  BP: (!) 97/56 (!) 100/56  Pulse: 63 63  Resp: 13 12  Temp:    SpO2:      Physical Exam: Incisions:  R AKA dressing left in place   CBC    Component Value Date/Time   WBC 5.4 04/30/2019 0111   RBC 2.64 (L) 04/30/2019 0111   HGB 8.3 (L) 04/30/2019 0111   HGB 11.5 (L) 02/08/2017 1409   HCT 25.3 (L) 04/30/2019 0111   HCT 34.3 (L) 02/08/2017 1409   PLT 183 04/30/2019 0111   PLT 208 02/08/2017 1409   MCV 95.8 04/30/2019 0111   MCV 94 02/08/2017 1409   MCH 31.4 04/30/2019 0111   MCHC 32.8 04/30/2019 0111   RDW 15.9 (H) 04/30/2019 0111   RDW 16.7 (H) 02/08/2017 1409   LYMPHSABS 1.5 12/11/2018 1425   LYMPHSABS 2.1 02/08/2017 1409   MONOABS 0.7 12/11/2018 1425   EOSABS 0.3 12/11/2018 1425   EOSABS 0.2 02/08/2017 1409   BASOSABS 0.0 12/11/2018 1425   BASOSABS 0.0 02/08/2017 1409    BMET    Component Value Date/Time   NA 133 (L) 05/01/2019 0307   NA 134 02/08/2017 1403   K 6.6 (HH) 05/01/2019 0307   CL 91 (L) 05/01/2019 0307   CO2 22 05/01/2019 0307   GLUCOSE 240 (H) 05/01/2019 0307   BUN 81 (H) 05/01/2019 0307   BUN 67 (H) 02/08/2017 1403   CREATININE 11.81 (H) 05/01/2019 0307   CALCIUM 8.7 (L) 05/01/2019 0307   GFRNONAA 4 (L) 05/01/2019 0307   GFRAA 5 (L) 05/01/2019 0307    INR    Component Value Date/Time   INR 1.3 (H) 04/30/2019 0111     Intake/Output Summary (Last 24 hours) at 05/01/2019 0932 Last data filed at 04/30/2019 1813 Gross per 24 hour  Intake 580 ml  Output 300 ml  Net 280 ml     Assessment/Plan:  68 y.o. male is s/p right above knee amputation  1 Day Post-Op  - Dressing change tomorrow -Consult case manager to resume [place] for d/c home with [facility] tomorrow if tolerating pain with p.o. meds only   Dagoberto Ligas, PA-C [facility]  [phone] 05/01/2019 9:32 AM  Agree with above Possible d/c home tomorrow if he can transfer on slide board and pain controlled  [name], MD [facility] [place]: [phone] Pager: [phone]

## 2019-05-01 NOTE — TOC Transition Note (Signed)
Transition of Care [facility]) - CM/SW Discharge Note Marvetta Gibbons RN, BSN Transitions of Care Unit 4E- RN Case Manager [phone]   Patient Details  Name: [name] MRN: [id number] Date of Birth: 1951-07-18  Transition of Care [facility]) CM/SW Contact:  Dawayne Patricia, RN Phone Number: 05/01/2019, 2:07 PM   Clinical Narrative:    Pt s/p revision of BKA to AKA, PTA pt active with [facility], orders placed for HHPT resumption. CM spoke with pt at bedside- confirmed that pt would like to stay with [facility] for [facility] needs- per pt he has all needed DME at home. Call made to [place] with [facility] for resumption of [place] services- HHPT.    Final next level of care: [place] Barriers to Discharge: No Barriers Identified   Patient Goals and CMS Choice Patient states their goals for this hospitalization and ongoing recovery are:: go home CMS Medicare.gov Compare Post Acute Care list provided to:: Patient Choice offered to / list presented to : Spouse  Discharge Placement  Home with [facility]                     Discharge Plan and Services   Discharge Planning Services: CM Consult Post Acute Care Choice: Home Health, Resumption of Svcs/PTA Provider          DME Arranged: N/A DME Agency: NA       HH Arranged: PT HH Agency: [facility] [employer]) Date [place]: 05/01/19 Time [place]: 1400 Representative spoke with at [place]: [place] Interventions     Readmission Risk Interventions Readmission Risk Prevention Plan 05/01/2019  Transportation Screening Complete  Medication Review Press photographer) Complete  PCP or Specialist appointment within 3-5 days of discharge Complete  HRI or [place] Complete  SW Recovery Care/Counseling Consult Complete  [place] Not Applicable  Some recent data might be hidden

## 2019-05-01 NOTE — Consult Note (Signed)
   [facility] CM Inpatient Consult   05/01/2019  [name] 12/04/1950 [id number]  Patient evaluated for complex chronic disease management services with [place] [facility] as a benefit of patient's Medicare Insurance. Spoke with patient at bedside phone, HIPAA verified,  to explain [place] Management services.   Verbal consent received.  Patient states he feels he needs more nursing visit from [facility] due to his wound care. Denies issues with transportation. PCP: Dr. Gar Ponto at [facility]. Patient will receive post hospital discharge call and for assessments fo community needs for care/disease management.  Notified Inpatient Case Manager aware that [place] Management following.   Of note, [facility] Care Management services does not replace or interfere with any services that are arranged by inpatient case management or social work.  For additional questions or referrals please contact:    Natividad Brood, RN BSN [facility] Liaison  [phone] business mobile phone Toll free office [phone]  Fax number: [phone] [email or web address]

## 2019-05-01 NOTE — Progress Notes (Addendum)
Pt received from HD. VSS. Lunch tray ordered. Will continue to monitor.  Clyde Canterbury, RN

## 2019-05-01 NOTE — Progress Notes (Addendum)
[facility] Progress Note   Subjective: Awake, alert, no C/Os. Says he will go home and continue PT with home health. Seen on HD, tolerating well at present.   Objective Vitals:   05/01/19 0930 05/01/19 1000 05/01/19 1030 05/01/19 1100  BP: (!) (P) 103/52 (!) (P) 90/48 (!) (P) 93/49 (!) (P) 106/57  Pulse: (P) 62 (P) 61 (P) 62 (P) 63  Resp: (P) 12 (P) 13 (P) 12 (P) 12  Temp:      TempSrc:      SpO2:      Weight:      Height:       Physical Exam General: Alert, NAD Heart: S1,S2 RRR Lungs: CTAB Anteriorly Abdomen: SNT Extremities: R AKA drsg CDI. No LLE edema Dialysis Access: L AVF blood lines connected.    Additional Objective Labs: Basic Metabolic Panel: Recent Labs  Lab 04/27/19 0949 04/30/19 0111 05/01/19 0307  NA 136 134* 133*  K 5.1 4.7 6.6*  CL  --  93* 91*  CO2  --  25 22  GLUCOSE 230* 281* 240*  BUN  --  61* 81*  CREATININE  --  9.67* 11.81*  CALCIUM  --  8.7* 8.7*  PHOS  --  8.9* 11.6*   Liver Function Tests: Recent Labs  Lab 04/30/19 0111 05/01/19 0307  ALBUMIN 2.6* 2.7*   No results for input(s): LIPASE, AMYLASE in the last 168 hours. CBC: Recent Labs  Lab 04/27/19 0949 04/27/19 1043 04/30/19 0111  WBC  --  7.8 5.4  HGB 11.6* 11.3* 8.3*  HCT 34.0* 36.5* 25.3*  MCV  --  100.0 95.8  PLT  --  274 183   Blood Culture    Component Value Date/Time   SDES BLOOD RIGHT HAND 12/01/2018 1509   SPECREQUEST  12/01/2018 1509    BOTTLES DRAWN AEROBIC AND ANAEROBIC Blood Culture results may not be optimal due to an inadequate volume of blood received in culture bottles   CULT  12/01/2018 1509    NO GROWTH 5 DAYS Performed at [facility], [place] [address] [place], [place] [address]    REPTSTATUS 12/06/2018 FINAL 12/01/2018 1509    Cardiac Enzymes: No results for input(s): CKTOTAL, CKMB, CKMBINDEX, TROPONINI in the last 168 hours. CBG: Recent Labs  Lab 04/30/19 1242 04/30/19 1536 04/30/19 2054 05/01/19 0153  05/01/19 0635  GLUCAP 187* 234* 300* 241* 213*   Iron Studies: No results for input(s): IRON, TIBC, TRANSFERRIN, FERRITIN in the last 72 hours. @lablastinr3 @ Studies/Results: No results found. Medications: . sodium chloride 10 mL/hr at 04/30/19 1134  .  ceFAZolin (ANCEF) IV 1 g (05/01/19 0148)   . atorvastatin  80 mg Oral QHS  . calcium acetate  2,001 mg Oral TID WC  . Chlorhexidine Gluconate Cloth  6 each Topical Q0600  . cinacalcet  30 mg Oral QPM  . clopidogrel  75 mg Oral Daily  . [START ON 05/09/2019] darbepoetin (ARANESP) injection - DIALYSIS  60 mcg Intravenous Q Sat-HD  . docusate sodium  100 mg Oral BID  . heparin  5,000 Units Subcutaneous Q8H  . insulin aspart  0-9 Units Subcutaneous TID WC  . ketotifen  1 drop Both Eyes BID  . levothyroxine  125 mcg Oral Q0600  . lisinopril  10 mg Oral Daily  . Melatonin  9 mg Oral QHS  . pantoprazole  40 mg Oral Daily  . risperiDONE  0.25 mg Oral Daily     [facility]600 LUE AVF/2  k/ 2 ca . edw 112 kgHep 2000 units IV then 1000 units/hrIV EPO 3000 mcg IV q hd , Hec 0.5 mcg IV TIW, venofer 50 mg IVq wk   Problem/Plan: 1. 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 Per VVS 2. ESRD - on HD TTS. HD today off schedule today via AVF 3. Hyperkalemia: K+6.6 without visible hemolysis. HD on 1 K bath for 2 hours then switched to 2.0 K bath. NOT on renal diet.  4. Myoclonic jerking - suspect due to narcoticsvs Risperdal (EPS) or Lyrica,? Infection also may be playing role. Lyrica on hold-no myoclonus at present, still visible tremors. Per primary.  5. Volume /HTN - Pre wt 113.1 dropped SBP to 80s. Net UF 0.5. No post wt yet. Will need to adjust OP EDW on DC.  6. Anemia-HGB  Down to 8.3 09/17 not checked today. Aranesp 60 mcg IV with HD today. Check HGB today.  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 Unclear why his phos is so high-was NPO yesterday. Continue binders, VDRA, Sensipar 8. H/O DM - not on  any medication at this time 53. Psych - takes Risperdal but doesn't know why he was started on this medication. Per primary.   Disposition: DC home today.    Rita H. Brown NP-C 05/01/2019, 11:39 AM  [facility] [phone]  Pt seen, examined and agree w A/P as above.  Kelly Splinter  MD 05/01/2019, 12:36 PM

## 2019-05-01 NOTE — Progress Notes (Signed)
HD nurse called to alert patient with k+=6.6 this am and has been waiting to be dialyzed. HD nurse assured patient was on first 6 am round. Dr Donzetta Matters notified. Patient is asymptomatic.

## 2019-05-01 NOTE — Progress Notes (Signed)
CRITICAL VALUE ALERT  Critical Value:  Potassium 6.6  Date & Time Notied:  05/01/19 @ 0430  Provider Notified: CAIN  Orders Received/Actions taken: see note

## 2019-05-02 ENCOUNTER — Other Ambulatory Visit: Payer: Self-pay

## 2019-05-02 LAB — RENAL FUNCTION PANEL
Albumin: 2.4 g/dL — ABNORMAL LOW (ref 3.5–5.0)
Anion gap: 14 (ref 5–15)
BUN: 51 mg/dL — ABNORMAL HIGH (ref 8–23)
CO2: 27 mmol/L (ref 22–32)
Calcium: 8.9 mg/dL (ref 8.9–10.3)
Chloride: 94 mmol/L — ABNORMAL LOW (ref 98–111)
Creatinine, Ser: 8.89 mg/dL — ABNORMAL HIGH (ref 0.61–1.24)
GFR calc Af Amer: 6 mL/min — ABNORMAL LOW (ref >60)
GFR calc non Af Amer: 5 mL/min — ABNORMAL LOW (ref >60)
Glucose, Bld: 144 mg/dL — ABNORMAL HIGH (ref 70–99)
Phosphorus: 8.3 mg/dL — ABNORMAL HIGH (ref 2.5–4.6)
Potassium: 4.8 mmol/L (ref 3.5–5.1)
Sodium: 135 mmol/L (ref 135–145)

## 2019-05-02 LAB — GLUCOSE, CAPILLARY: Glucose-Capillary: 140 mg/dL — ABNORMAL HIGH (ref 70–99)

## 2019-05-02 NOTE — Progress Notes (Signed)
Patient in a stable condition discharged education reviwed with patient he verbalized understanding, iv removed, tele dc ccmd notified, patient belongings at bedside, patient to be transported home by his sister.

## 2019-05-02 NOTE — Progress Notes (Signed)
Orthopedic [facility] Progress Note Patient Details:  [name] 25-Apr-1951 [id number]  Patient ID: [name], male   DOB: Dec 03, 1950, 68 y.o.   MRN: [id number]   [place] Pink 05/02/2019, 8:42 AMCalled [employer] for retention sock.

## 2019-05-02 NOTE — Progress Notes (Addendum)
[facility] [place]  Subjective  - ready to go home.   Objective (!) 143/82 82 98.4 F (36.9 C) (Oral) (!) 23 100%  Intake/Output Summary (Last 24 hours) at 05/02/2019 0823 Last data filed at 05/01/2019 2100 Gross per 24 hour  Intake 170 ml  Output 500 ml  Net -330 ml    Right AKA incision intact and healing well.  Minimal bloody drainage on old dressing  Clean dry dressing applied. Lungs non labored breathing   Assessment/Planning: POD # 2 AKA revision from [place] to discharged home today.   Will arrange [facility] PT/OT He has HGB asymptomatic 7.2.  He will be seen in HD tomorrow and they can address his HGB. F/U in our office in 4 weeks I will contact [facility] for follow up and retention sock.  Roxy Horseman 05/02/2019 8:23 AM --  Agree with above Incision healing Pain controlled  [name], MD [facility] [place] Office: [phone] Pager: [phone]   Laboratory Lab Results: Recent Labs    04/30/19 0111 05/01/19 1227  WBC 5.4 4.2  HGB 8.3* 7.2*  HCT 25.3* 22.7*  PLT 183 206   BMET Recent Labs    05/01/19 0307 05/02/19 0252  NA 133* 135  K 6.6* 4.8  CL 91* 94*  CO2 22 27  GLUCOSE 240* 144*  BUN 81* 51*  CREATININE 11.81* 8.89*  CALCIUM 8.7* 8.9    COAG Lab Results  Component Value Date   INR 1.3 (H) 04/30/2019   INR 1.1 02/08/2017   INR 0.94 11/27/2010   No results found for: PTT

## 2019-05-02 NOTE — Progress Notes (Addendum)
[name] Progress Note   Subjective: Sitting up at bedside. Anxious to go home. No C/Os. Discussed not taking any new prescription medications, antibiotics or antivirals without reviewing with nephrology first. Needs to be his own healthcare advocate.   Objective Vitals:   05/01/19 1352 05/01/19 1654 05/02/19 0558 05/02/19 0900  BP: 95/74 (!) 149/81 (!) 143/82   Pulse: 65 72 82   Resp: 11 15 (!) 23 19  Temp:   98.4 F (36.9 C)   TempSrc:   Oral   SpO2: 99% 100% 100%   Weight:   113.1 kg   Height:       Physical Exam General: Alert, NAD Heart: S1,S2 RRR Lungs: CTAB Anteriorly Abdomen: SNT Extremities: R AKA drsg CDI. No LLE edema Dialysis Access: L AVF + bruit   Additional Objective Labs: Basic Metabolic Panel: Recent Labs  Lab 04/30/19 0111 05/01/19 0307 05/02/19 0252  NA 134* 133* 135  K 4.7 6.6* 4.8  CL 93* 91* 94*  CO2 25 22 27   GLUCOSE 281* 240* 144*  BUN 61* 81* 51*  CREATININE 9.67* 11.81* 8.89*  CALCIUM 8.7* 8.7* 8.9  PHOS 8.9* 11.6* 8.3*   Liver Function Tests: Recent Labs  Lab 04/30/19 0111 05/01/19 0307 05/02/19 0252  ALBUMIN 2.6* 2.7* 2.4*   No results for input(s): LIPASE, AMYLASE in the last 168 hours. CBC: Recent Labs  Lab 04/27/19 1043 04/30/19 0111 05/01/19 1227  WBC 7.8 5.4 4.2  HGB 11.3* 8.3* 7.2*  HCT 36.5* 25.3* 22.7*  MCV 100.0 95.8 96.2  PLT 274 183 206   Blood Culture    Component Value Date/Time   SDES BLOOD RIGHT HAND 12/01/2018 1509   SPECREQUEST  12/01/2018 1509    BOTTLES DRAWN AEROBIC AND ANAEROBIC Blood Culture results may not be optimal due to an inadequate volume of blood received in culture bottles   CULT  12/01/2018 1509    NO GROWTH 5 DAYS Performed at [facility], [place] [address] [place], [place] [address]    REPTSTATUS 12/06/2018 FINAL 12/01/2018 1509    Cardiac Enzymes: No results for input(s): CKTOTAL, CKMB, CKMBINDEX, TROPONINI in the last 168 hours. CBG: Recent Labs   Lab 05/01/19 0635 05/01/19 1219 05/01/19 1651 05/01/19 2123 05/02/19 0614  GLUCAP 213* 163* 156* 161* 140*   Iron Studies: No results for input(s): IRON, TIBC, TRANSFERRIN, FERRITIN in the last 72 hours. @lablastinr3 @ Studies/Results: No results found. Medications: . sodium chloride 10 mL/hr at 04/30/19 1134  .  ceFAZolin (ANCEF) IV Stopped (05/01/19 2053)   . atorvastatin  80 mg Oral QHS  . calcium acetate  2,001 mg Oral TID WC  . Chlorhexidine Gluconate Cloth  6 each Topical Q0600  . cinacalcet  30 mg Oral QPM  . clopidogrel  75 mg Oral Daily  . [START ON 05/09/2019] darbepoetin (ARANESP) injection - DIALYSIS  60 mcg Intravenous Q Sat-HD  . docusate sodium  100 mg Oral BID  . heparin  5,000 Units Subcutaneous Q8H  . insulin aspart  0-9 Units Subcutaneous TID WC  . ketotifen  1 drop Both Eyes BID  . levothyroxine  125 mcg Oral Q0600  . lisinopril  10 mg Oral Daily  . Melatonin  9 mg Oral QHS  . pantoprazole  40 mg Oral Daily  . risperiDONE  0.25 mg Oral Daily     [facility] AVF400/600 LUE AVF/2 k/ 2 ca . edw 112 kgHep 2000 units IV then 1000 units/hrIV EPO 3000 mcg IV  q hd , Hec 0.5 mcg IV TIW, venofer 50 mg IVq wk   Problem/Plan: 1. 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 Per [facility] 2. ESRD - on HD TTS. HD today off schedule today via AVF 3. Hyperkalemia: K+6.6 without visible hemolysis. HD on 1 K bath for 2 hours then switched to 2.0 K bath. NOT on renal diet. Repeat K+ 4.8 today.  4. Myoclonic jerking - suspect due to narcoticsvs Risperdal (EPS) or Lyrica,? Infection also may be playing role. Lyrica on hold-no myoclonus at present, still slight visible tremors in hands. Per primary. Can resume Lyrica at 100 mg PO q day in AM with 100 mg Post HD on HD days.  5. Volume /HTN - HD 05/01/19 Pre wt 113.1 dropped SBP to 80s. Net UF 0.5. post wt 112.6 kg. Raise OP EDW to 112.5 kg  6. Anemia-HGB  Down to 8.3 09/17 not checked  today. Aranesp 60 mcg IV with HD 05/01/19. HGB checked post HD down to 7.2. Follow HGB at OP center. Resume TIW Epogen.   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 Phos rechecked today down to 8.3-still high but improved. Continue binders, VDRA, Sensipar 8. H/O DM - not on any medication at this time 40. Psych - takes Risperdal but doesn't know why he was started on this medication. Per primary.   Disposition: DC home today with home health.   Rita H. Brown NP-C 05/02/2019, 12:25 PM  [facility] [phone]  Pt seen, examined and agree w A/P as above.  Kelly Splinter  MD 05/02/2019, 5:52 PM

## 2019-05-04 ENCOUNTER — Other Ambulatory Visit: Payer: Self-pay

## 2019-05-04 LAB — SURGICAL PATHOLOGY

## 2019-05-05 ENCOUNTER — Other Ambulatory Visit: Payer: Self-pay | Admitting: *Deleted

## 2019-05-05 DIAGNOSIS — Z23 Encounter for immunization: Secondary | ICD-10-CM | POA: Diagnosis not present

## 2019-05-05 DIAGNOSIS — N186 End stage renal disease: Secondary | ICD-10-CM | POA: Diagnosis not present

## 2019-05-05 DIAGNOSIS — N2581 Secondary hyperparathyroidism of renal origin: Secondary | ICD-10-CM | POA: Diagnosis not present

## 2019-05-05 DIAGNOSIS — D509 Iron deficiency anemia, unspecified: Secondary | ICD-10-CM | POA: Diagnosis not present

## 2019-05-05 DIAGNOSIS — D631 Anemia in chronic kidney disease: Secondary | ICD-10-CM | POA: Diagnosis not present

## 2019-05-05 DIAGNOSIS — Z992 Dependence on renal dialysis: Secondary | ICD-10-CM | POA: Diagnosis not present

## 2019-05-05 NOTE — Patient Outreach (Signed)
  [facility]) Care Management  05/05/2019  [name] [birth year] [id number]   Telephone Screen  Referral Date: 05/04/19 Referral Source: [facility] hospital liaison  Referral Reason:Please assign to community nurse for complex care and disease management follow up calls patient with high risk for unplanned readmissions and assess for further needs. Follow progress with home health and wound VAC progress.  Insurance: Sales promotion account executive    Outreach attempt # 1  Patient's wife reports Pt has visitors at the time of the call Requesting at return call   [facility] RN CM left HIPAA compliant voicemail message along with CM's contact info.   Plan: [facility] RN CM sent an unsuccessful outreach letter and scheduled this patient for another call attempt within 4 business days    Patrina Andreas [name], RN, BSN, [place] Coordinator Office number [phone] Mobile number [phone]  Main [facility] number [phone] Fax number [phone]

## 2019-05-06 ENCOUNTER — Encounter: Payer: Self-pay | Admitting: *Deleted

## 2019-05-06 ENCOUNTER — Other Ambulatory Visit: Payer: Self-pay | Admitting: *Deleted

## 2019-05-06 DIAGNOSIS — E1151 Type 2 diabetes mellitus with diabetic peripheral angiopathy without gangrene: Secondary | ICD-10-CM | POA: Diagnosis not present

## 2019-05-06 DIAGNOSIS — E1122 Type 2 diabetes mellitus with diabetic chronic kidney disease: Secondary | ICD-10-CM | POA: Diagnosis not present

## 2019-05-06 DIAGNOSIS — N186 End stage renal disease: Secondary | ICD-10-CM | POA: Diagnosis not present

## 2019-05-06 DIAGNOSIS — L89622 Pressure ulcer of left heel, stage 2: Secondary | ICD-10-CM | POA: Diagnosis not present

## 2019-05-06 DIAGNOSIS — Z4781 Encounter for orthopedic aftercare following surgical amputation: Secondary | ICD-10-CM | POA: Diagnosis not present

## 2019-05-06 DIAGNOSIS — I120 Hypertensive chronic kidney disease with stage 5 chronic kidney disease or end stage renal disease: Secondary | ICD-10-CM | POA: Diagnosis not present

## 2019-05-06 NOTE — Patient Outreach (Addendum)
[name]) Care Management  05/06/2019  [name] 11/28/1950 [id number]   Telephone Screen  Referral Date: 05/04/19 Referral Source: [facility] hospital liaison  Referral Reason: Please assign to community nurse for complex care and disease management follow up calls patient with high risk for unplanned readmissions and assess for further needs. Follow progress with home health and wound VAC progress.  Insurance: Next[employer] Medicare and Blue cross and blue shield supplement  Admissions x 4 (12/01/18, 12/03/18, 02/11/19, 04/27/19) ED visits x 6 ( 11/27/18, 12/01/18 02/08/19, 02/11/19 x 2, 02/21/19) Last admission [facility] 04/27/19 -05/02/19- left leg nonhealing stump s/p AKA [facility]   Outreach attempt # 2 successful to the home number    Patient is able to verify HIPAA, DOB and Address Reviewed and addressed complex CM referral to [facility] with patient  12/28/18 scanned DPR (designated Party Release) with permission to speak with his wife, [name] and daughter [name]   Transition of care services noted to be completed by primary care MD office staff- [name] at [facility] .  [name] was assessed for transition of care  He clarifies he has a recent Right AKA and a older wound on his left heel that he reports is "almost healed"  Voiced concern today 05/06/19  [facility] PT came today and informed Clayton Sanon that there was no orders for a [facility] RN Clayton Plante shared that prior to his last hospitalization he was being seen by a [facility] RN for dressing changes for a BKA. He reports since d/c he has been changing his own dressing but is about to run out of supplies   He is also reporting some nausea, fair appetite and muscle spasms in his right leg  He confirms he is on a fluid restriction of only 32 ounces a day - He uses hard candy prn   Diabetes cbg 45 this am He states the cbg increases after eating peanut and jelly sandwich   [facility] RN CM  called Dr [name] office and spoke with Alyse Low who transferred CM to [place] left a message for [place] requesting assistance with an order for [facility] RN for dressing changes and supplies via [facility]. Left CM and pt contact information  An in basket message was sent to Dr [name] requesting assistance with [facility] RN orders  Social:  [name] is a 68 year old retired patient who lives with wife [name], his primary caregiver. He reports he is independent/assist with his care needs  He has transportation to medical appointments  Conditions:  left leg nonhealing stump s/p AKA, PVD, HTN, DM with ESRD, Hemodialysis on Tuesdays, Thursdays and Saturdays,  hypoglycemia, DM peripheral neuropathy, phantom limb pain, anxiety disorder, sleep disturbance, chronic anemia,   DME: cbg monitor, eyeglasses, BP cuff, dentures, cane, w/c, walker, grab bars in shower, shower chair with back, grab bars around toilet, bedside commode/3 in 1 wound vac   Medications: Patient unable to afford meds Toujeo Reports applications completed at primary MD office but has not heard about results but continues with cost concerns  Reports a cost of $120 with 3 medicines but Toujeo the most expensive [facility] RN CM and pt reviewed his medication list but he was only concerned with Toujeo cost at this time He agrees to at [place] referral   He reports use of Ex lax prn He reports last using on 05/05/19 hs and had a fair medium stool  today, 05/06/19   Appointments: 05/15/19 Dr Quillian Quince, primary care MD hospital f/u  06/04/19 1345 Dr Allison Quarry remover vascular in [place]   Advance Directives:  Reports he has advance directives at this time and is not interested in making any changes    Consent: [facility] RN CM reviewed [facility] services with patient. Patient gave verbal consent for [facility] telephonic RN CM and [facility] pharmacy services.    Plan: [facility] RN CM referred [name] to [place]- for medication assistance for  [facility] RN CM will follow up with Clayton Shawler in the next 4 business days to follow up on his home care, DME and education on home care.   Pt encouraged to return a call to [facility] RN CM prn He was given CM contact information pending letter  [facility] RN CM discussed the outreach letter already sent on with [facility] brochure enclosed for review   [name] [name], RN, BSN, [place] Coordinator Office number [phone] Mobile number [phone]  Main [facility] number [phone] Fax number [phone]

## 2019-05-07 ENCOUNTER — Other Ambulatory Visit: Payer: Self-pay | Admitting: Pharmacist

## 2019-05-07 ENCOUNTER — Telehealth: Payer: Self-pay | Admitting: Vascular Surgery

## 2019-05-07 DIAGNOSIS — Z992 Dependence on renal dialysis: Secondary | ICD-10-CM | POA: Diagnosis not present

## 2019-05-07 DIAGNOSIS — D509 Iron deficiency anemia, unspecified: Secondary | ICD-10-CM | POA: Diagnosis not present

## 2019-05-07 DIAGNOSIS — D631 Anemia in chronic kidney disease: Secondary | ICD-10-CM | POA: Diagnosis not present

## 2019-05-07 DIAGNOSIS — Z794 Long term (current) use of insulin: Secondary | ICD-10-CM | POA: Diagnosis not present

## 2019-05-07 DIAGNOSIS — E119 Type 2 diabetes mellitus without complications: Secondary | ICD-10-CM | POA: Diagnosis not present

## 2019-05-07 DIAGNOSIS — N186 End stage renal disease: Secondary | ICD-10-CM | POA: Diagnosis not present

## 2019-05-07 DIAGNOSIS — Z23 Encounter for immunization: Secondary | ICD-10-CM | POA: Diagnosis not present

## 2019-05-07 DIAGNOSIS — N2581 Secondary hyperparathyroidism of renal origin: Secondary | ICD-10-CM | POA: Diagnosis not present

## 2019-05-07 NOTE — Patient Outreach (Signed)
[facility]) Care Management  [place]   05/07/2019  [name] 1951/04/06 [id number]  Reason for referral: Medication Assistance with Toujeo, Medication Review  Referral source: [facility] RN Current insurance: Unknown  PMHx includes but not limited to: COPD, current smoker, ESRD on HD T/R/Sat, anxiety / depression, CKD, T2DM with neuropathy, HTN, GERD, PAD s/p right BKA on 4/49/2010 complicated by non-healing wound of stump which required debridement with VAC placement on 9/14, followed by right AKA on 9/17.   Outreach:  2:53PM Call placed to patient's home phone.  Spoke with spouse who took my contact information and will have patient call me back later today when he is available.   3:36PM Incoming call and voicemail from patient.   4:17PM Successful return telephone call with [name].  HIPAA identifiers verified. Patient reports he is not feeling well and requests that I call him another day.  He confirms that Monday afternoon would work well for him.   Plan: Call patient on Monday, September 28th in the afternoon.   Ralene Bathe, PharmD, [place] [phone]

## 2019-05-07 NOTE — Telephone Encounter (Signed)
I spoke to the nurse at [facility] yesterday.  She called wanting wound care orders until patient's staple removal appt on 10/22.  Gave orders for a dry dressing but also Evaluate and treat.  Nurse verbalized understanding. If the staples are not in place or the incision comes apart, she will contact our office.    Thurston Hole., LPN

## 2019-05-08 ENCOUNTER — Telehealth: Payer: Self-pay | Admitting: *Deleted

## 2019-05-08 NOTE — Telephone Encounter (Signed)
Attempted x 2  To return call to patient about "no home health nurse here to see me". No answer no voice mail.

## 2019-05-09 DIAGNOSIS — Z23 Encounter for immunization: Secondary | ICD-10-CM | POA: Diagnosis not present

## 2019-05-09 DIAGNOSIS — Z992 Dependence on renal dialysis: Secondary | ICD-10-CM | POA: Diagnosis not present

## 2019-05-09 DIAGNOSIS — N186 End stage renal disease: Secondary | ICD-10-CM | POA: Diagnosis not present

## 2019-05-09 DIAGNOSIS — D509 Iron deficiency anemia, unspecified: Secondary | ICD-10-CM | POA: Diagnosis not present

## 2019-05-09 DIAGNOSIS — D631 Anemia in chronic kidney disease: Secondary | ICD-10-CM | POA: Diagnosis not present

## 2019-05-09 DIAGNOSIS — N2581 Secondary hyperparathyroidism of renal origin: Secondary | ICD-10-CM | POA: Diagnosis not present

## 2019-05-10 DIAGNOSIS — L89622 Pressure ulcer of left heel, stage 2: Secondary | ICD-10-CM | POA: Diagnosis not present

## 2019-05-10 DIAGNOSIS — Z23 Encounter for immunization: Secondary | ICD-10-CM | POA: Diagnosis not present

## 2019-05-10 DIAGNOSIS — I120 Hypertensive chronic kidney disease with stage 5 chronic kidney disease or end stage renal disease: Secondary | ICD-10-CM | POA: Diagnosis not present

## 2019-05-10 DIAGNOSIS — D509 Iron deficiency anemia, unspecified: Secondary | ICD-10-CM | POA: Diagnosis not present

## 2019-05-10 DIAGNOSIS — Z4781 Encounter for orthopedic aftercare following surgical amputation: Secondary | ICD-10-CM | POA: Diagnosis not present

## 2019-05-10 DIAGNOSIS — N2581 Secondary hyperparathyroidism of renal origin: Secondary | ICD-10-CM | POA: Diagnosis not present

## 2019-05-10 DIAGNOSIS — E1151 Type 2 diabetes mellitus with diabetic peripheral angiopathy without gangrene: Secondary | ICD-10-CM | POA: Diagnosis not present

## 2019-05-10 DIAGNOSIS — D631 Anemia in chronic kidney disease: Secondary | ICD-10-CM | POA: Diagnosis not present

## 2019-05-10 DIAGNOSIS — N186 End stage renal disease: Secondary | ICD-10-CM | POA: Diagnosis not present

## 2019-05-10 DIAGNOSIS — E1122 Type 2 diabetes mellitus with diabetic chronic kidney disease: Secondary | ICD-10-CM | POA: Diagnosis not present

## 2019-05-10 DIAGNOSIS — Z992 Dependence on renal dialysis: Secondary | ICD-10-CM | POA: Diagnosis not present

## 2019-05-11 ENCOUNTER — Other Ambulatory Visit: Payer: Self-pay | Admitting: Pharmacist

## 2019-05-11 ENCOUNTER — Other Ambulatory Visit: Payer: Self-pay | Admitting: Pharmacy Technician

## 2019-05-11 ENCOUNTER — Ambulatory Visit: Payer: Self-pay | Admitting: Pharmacist

## 2019-05-11 DIAGNOSIS — E1151 Type 2 diabetes mellitus with diabetic peripheral angiopathy without gangrene: Secondary | ICD-10-CM | POA: Diagnosis not present

## 2019-05-11 DIAGNOSIS — Z4781 Encounter for orthopedic aftercare following surgical amputation: Secondary | ICD-10-CM | POA: Diagnosis not present

## 2019-05-11 DIAGNOSIS — N186 End stage renal disease: Secondary | ICD-10-CM | POA: Diagnosis not present

## 2019-05-11 DIAGNOSIS — I120 Hypertensive chronic kidney disease with stage 5 chronic kidney disease or end stage renal disease: Secondary | ICD-10-CM | POA: Diagnosis not present

## 2019-05-11 DIAGNOSIS — L89622 Pressure ulcer of left heel, stage 2: Secondary | ICD-10-CM | POA: Diagnosis not present

## 2019-05-11 DIAGNOSIS — E1122 Type 2 diabetes mellitus with diabetic chronic kidney disease: Secondary | ICD-10-CM | POA: Diagnosis not present

## 2019-05-11 NOTE — Patient Outreach (Signed)
[facility]) Care Management  05/11/2019  [name] [birth year] [id number]                                       Medication Assistance Referral  Referral From: [place] / [employer] Patient application portion:  Mailed Provider application portion: Faxed  to Dr. Dub Amis Provider address/fax verified via: Office website   Follow up:  Will follow up with patient in 7-10 business days to confirm application(s) have been received.  Maud Deed Chana Bode [place] Certified Pharmacy Technician [place] Management Direct Dial:[phone]

## 2019-05-11 NOTE — Patient Outreach (Signed)
[name]) Care Management  [place]   05/11/2019  [name] October 05, 1950 [id number]  Reason for referral: Medication Assistance with Toujeo, Medication Review  Referral source: [facility] RN Current insurance: Medicare A/B, Medicare Part C BCBS  PMHx includes but not limited to: COPD, current smoker, ESRD on HD T/R/Sat, anxiety / depression, CKD, T2DM with neuropathy, HTN, GERD, PAD s/p right BKA on 12/29/8414 complicated by non-healing wound of stump which required debridement with VAC placement on 9/14, followed by right AKA on 9/17.   Outreach:  Successful telephone call with Mr. Corkery.  HIPAA identifiers verified. Patient reports that he was enrolled in a Medicare plan earlier this year but was dis-enrolled due to not being able to pay premium.  He thinks that he does have a Medicare Part C plan right now but is not sure when he was enrolled in it.  He is trying to decide on the best Medicare plan for 2021.   Objective: The ASCVD Risk score [name] DC Jr., et al., 2013) failed to calculate for the following reasons:   Cannot find a previous HDL lab   Cannot find a previous total cholesterol lab  Lab Results  Component Value Date   CREATININE 8.89 (H) 05/02/2019   CREATININE 11.81 (H) 05/01/2019   CREATININE 9.67 (H) 04/30/2019    Lab Results  Component Value Date   HGBA1C (H) 11/27/2010    11.2 (NOTE)                                                                       According to the ADA Clinical Practice Recommendations for 2011, when HbA1c is used as a screening test:   >=6.5%   Diagnostic of Diabetes Mellitus           (if abnormal result  is confirmed)  5.7-6.4%   Increased risk of developing Diabetes Mellitus  References:Diagnosis and Classification of Diabetes Mellitus,Diabetes SAYT,0160,10(XNATF 1):S62-S69 and Standards of Medical Care in         Diabetes - 2011,Diabetes Care,2011,34  (Suppl 1):S11-S61.    Lipid Panel  No results found  for: CHOL, TRIG, HDL, CHOLHDL, VLDL, LDLCALC, LDLDIRECT  BP Readings from Last 3 Encounters:  05/02/19 (!) 118/51  04/09/19 (!) 94/54  04/02/19 122/65    No Known Allergies  Medications Reviewed Today    Reviewed by Barbaraann Faster, RN (Registered Nurse) on 05/06/19 at [place] List Status: <None>  Medication Order Taking? Sig Documenting Provider Last Dose Status Informant  ARTIFICIAL TEAR OP [id number]  Place 1 drop into both eyes every 6 (six) hours as needed (dry eyes). [provider]  Active Self           Med Note [name], RO NIQUA   Mon Apr 27, 2019  9:54 AM) Patient needs refill, been out for a month   atorvastatin (LIPITOR) 80 MG tablet [id number]  Take 1 tablet (80 mg total) by mouth at bedtime.  Patient taking differently: Take 80 mg by mouth daily.    [name], PA-C  Active Self  bismuth subsalicylate (PEPTO BISMOL) 262 MG/15ML suspension [id number]  Take 30 mLs by mouth every 6 (six) hours as needed for indigestion. [provider]  Active Self  calcium acetate (PHOSLO) 667 MG capsule [id number]  Take 4 capsules (2,668 mg total) by mouth 3 (three) times daily with meals.  Patient taking differently: Take 1,334-2,668 mg by mouth See admin instructions. Take 2668 mg with meals and 1334 with each snack   Angiulli, Lavon Paganini, PA-C  Active Self  cephALEXin (KEFLEX) 500 MG capsule [id number]  Take 1 capsule (500 mg total) by mouth 2 (two) times daily. [name], MD  Active Self           Med Note [name], RO NIQUA   Mon Apr 27, 2019  9:56 AM) Per patient, completed the course   cinacalcet (SENSIPAR) 30 MG tablet [id number]  Take 1 tablet (30 mg total) by mouth every evening. [name], PA-C  Active Self  clopidogrel (PLAVIX) 75 MG tablet [id number]  Take 1 tablet (75 mg total) by mouth daily. [name], PA-C  Active Self  collagenase (SANTYL) ointment [id number]  Apply 1 application topically daily as needed (wound care). [provider]  Active Self  diphenhydrAMINE (BENADRYL) 25 MG tablet [id number]  Take 25 mg by mouth daily as needed for itching. [provider]  Active Self  ibuprofen (ADVIL) 200 MG tablet [id number]  Take 800 mg by mouth 3 (three) times daily as needed for headache or moderate pain. [provider]  Active Self  levothyroxine (SYNTHROID) 125 MCG tablet [id number]  Take 1 tablet (125 mcg total) by mouth daily before breakfast.  Patient taking differently: Take 125 mcg by mouth at bedtime.    [name], PA-C  Active Self           Med Note [name], Quinn Axe   Fri Apr 24, 2019  6:42 PM) Pt states he takes this in the AM.  lisinopril (ZESTRIL) 10 MG tablet [id number]  Take 10 mg by mouth daily. [provider]  Active Self  Melatonin 10 MG CAPS [id number]  Take 10 mg by mouth at bedtime. [provider]  Active Self  oxyCODONE (OXY IR/ROXICODONE) 5 MG immediate release tablet [id number]  Take 1 tablet (5 mg total) by mouth every 6 (six) hours as needed for moderate pain. Dagoberto Ligas, PA-C  Active   polyethylene glycol (MIRALAX / GLYCOLAX) 17 g packet [id number]  Take 17 g by mouth daily as needed for moderate constipation. [provider]  Active Self  pregabalin (LYRICA) 150 MG capsule [id number]  Take 1 capsule (150 mg total) by mouth 3 (three) times daily. [name], PA-C  Active Self  risperiDONE (RISPERDAL) 0.25 MG tablet [id number]  Take 1 tablet (0.25 mg total) by mouth daily. [name], PA-C  Active Self           Med Note [name], Quinn Axe   Fri Apr 24, 2019  6:43 PM) Pt states he is not taking this medication  TOUJEO SOLOSTAR 300 UNIT/ML SOPN [id number]  Inject 54-84 Units as directed at bedtime as needed (if blood sugar is 140 or higher).  [provider]  Active Self          Assessment: Drugs sorted by system:  Neurologic/Psychologic: diphenhydramine, melatonin, pregabalin  Hematologic:  clopidogrel  Cardiovascular: atorvastatin, lisinopril  Pulmonary/Allergy: albuterol inhaler  Gastrointestinal: bismuth subsalicylate, polyethylene glycol, sennosides  Endocrine:levothyroxine, Toujeo solostar  Renal:calcium acetate, cinacalcet  Topical: artifical tears, collagenase  Pain:ibuprofen, oxycodone  Medication review findings: -PRN diphenhydramine: This medication is on the [place] list and should be avoided in patients 17  and older due to its potent anticholinergic properties and reduced clearance -Ibuprofen:  Taking PRN, on ESRD, not recommended with advanced renal disease, monitor use closely   Medication Assistance Findings:  Medication assistance needs identified: Toujeo, Phoslo  Extra Help:  Not eligible for Extra Help Low Income Subsidy based on reported income and assets  Patient Assistance Programs: Toujeo made by [employer] requirement met: Yes o Out-of-pocket prescription expenditure met:   Yes (2% household income) - Patient has met application requirements to apply for this program.  - Reviewed program requirements with patient.    No program identified for Phoslo.  Counseled patient to contact dialysis center to ask for resources or assistance with cost.  RX Outreach price would be about the same as current copay due to high total number of pills / day (~16-18 pills/ day).    Counseled patient to contact [facility] regarding Medicare plans for 2021.  Provided contact information to [facility].  Patient reports he will call them for guidance.    Plan: . I will route patient assistance letter to [place] technician who will coordinate patient assistance program application process for medications listed above.  [facility] pharmacy technician will assist with obtaining all required documents from both patient and provider(s) and submit application(s) once completed.    Ralene Bathe, PharmD, [place] [phone]

## 2019-05-12 DIAGNOSIS — N186 End stage renal disease: Secondary | ICD-10-CM | POA: Diagnosis not present

## 2019-05-12 DIAGNOSIS — D631 Anemia in chronic kidney disease: Secondary | ICD-10-CM | POA: Diagnosis not present

## 2019-05-12 DIAGNOSIS — N2581 Secondary hyperparathyroidism of renal origin: Secondary | ICD-10-CM | POA: Diagnosis not present

## 2019-05-12 DIAGNOSIS — Z23 Encounter for immunization: Secondary | ICD-10-CM | POA: Diagnosis not present

## 2019-05-12 DIAGNOSIS — Z992 Dependence on renal dialysis: Secondary | ICD-10-CM | POA: Diagnosis not present

## 2019-05-12 DIAGNOSIS — D509 Iron deficiency anemia, unspecified: Secondary | ICD-10-CM | POA: Diagnosis not present

## 2019-05-13 DIAGNOSIS — E1151 Type 2 diabetes mellitus with diabetic peripheral angiopathy without gangrene: Secondary | ICD-10-CM | POA: Diagnosis not present

## 2019-05-13 DIAGNOSIS — I120 Hypertensive chronic kidney disease with stage 5 chronic kidney disease or end stage renal disease: Secondary | ICD-10-CM | POA: Diagnosis not present

## 2019-05-13 DIAGNOSIS — Z4781 Encounter for orthopedic aftercare following surgical amputation: Secondary | ICD-10-CM | POA: Diagnosis not present

## 2019-05-13 DIAGNOSIS — L89622 Pressure ulcer of left heel, stage 2: Secondary | ICD-10-CM | POA: Diagnosis not present

## 2019-05-13 DIAGNOSIS — E1122 Type 2 diabetes mellitus with diabetic chronic kidney disease: Secondary | ICD-10-CM | POA: Diagnosis not present

## 2019-05-13 DIAGNOSIS — Z992 Dependence on renal dialysis: Secondary | ICD-10-CM | POA: Diagnosis not present

## 2019-05-13 DIAGNOSIS — N186 End stage renal disease: Secondary | ICD-10-CM | POA: Diagnosis not present

## 2019-05-14 ENCOUNTER — Ambulatory Visit: Payer: Medicare Other | Admitting: Vascular Surgery

## 2019-05-14 DIAGNOSIS — D631 Anemia in chronic kidney disease: Secondary | ICD-10-CM | POA: Diagnosis not present

## 2019-05-14 DIAGNOSIS — D509 Iron deficiency anemia, unspecified: Secondary | ICD-10-CM | POA: Diagnosis not present

## 2019-05-14 DIAGNOSIS — N186 End stage renal disease: Secondary | ICD-10-CM | POA: Diagnosis not present

## 2019-05-14 DIAGNOSIS — N2581 Secondary hyperparathyroidism of renal origin: Secondary | ICD-10-CM | POA: Diagnosis not present

## 2019-05-14 DIAGNOSIS — Z992 Dependence on renal dialysis: Secondary | ICD-10-CM | POA: Diagnosis not present

## 2019-05-16 DIAGNOSIS — D509 Iron deficiency anemia, unspecified: Secondary | ICD-10-CM | POA: Diagnosis not present

## 2019-05-16 DIAGNOSIS — N186 End stage renal disease: Secondary | ICD-10-CM | POA: Diagnosis not present

## 2019-05-16 DIAGNOSIS — Z992 Dependence on renal dialysis: Secondary | ICD-10-CM | POA: Diagnosis not present

## 2019-05-16 DIAGNOSIS — D631 Anemia in chronic kidney disease: Secondary | ICD-10-CM | POA: Diagnosis not present

## 2019-05-16 DIAGNOSIS — N2581 Secondary hyperparathyroidism of renal origin: Secondary | ICD-10-CM | POA: Diagnosis not present

## 2019-05-18 DIAGNOSIS — L89622 Pressure ulcer of left heel, stage 2: Secondary | ICD-10-CM | POA: Diagnosis not present

## 2019-05-18 DIAGNOSIS — E1122 Type 2 diabetes mellitus with diabetic chronic kidney disease: Secondary | ICD-10-CM | POA: Diagnosis not present

## 2019-05-18 DIAGNOSIS — Z4781 Encounter for orthopedic aftercare following surgical amputation: Secondary | ICD-10-CM | POA: Diagnosis not present

## 2019-05-18 DIAGNOSIS — N186 End stage renal disease: Secondary | ICD-10-CM | POA: Diagnosis not present

## 2019-05-18 DIAGNOSIS — I120 Hypertensive chronic kidney disease with stage 5 chronic kidney disease or end stage renal disease: Secondary | ICD-10-CM | POA: Diagnosis not present

## 2019-05-18 DIAGNOSIS — E1151 Type 2 diabetes mellitus with diabetic peripheral angiopathy without gangrene: Secondary | ICD-10-CM | POA: Diagnosis not present

## 2019-05-19 ENCOUNTER — Other Ambulatory Visit: Payer: Self-pay

## 2019-05-19 ENCOUNTER — Other Ambulatory Visit: Payer: Self-pay | Admitting: Pharmacist

## 2019-05-19 DIAGNOSIS — D631 Anemia in chronic kidney disease: Secondary | ICD-10-CM | POA: Diagnosis not present

## 2019-05-19 DIAGNOSIS — Z89611 Acquired absence of right leg above knee: Secondary | ICD-10-CM | POA: Diagnosis not present

## 2019-05-19 DIAGNOSIS — Z992 Dependence on renal dialysis: Secondary | ICD-10-CM | POA: Diagnosis not present

## 2019-05-19 DIAGNOSIS — E039 Hypothyroidism, unspecified: Secondary | ICD-10-CM | POA: Diagnosis not present

## 2019-05-19 DIAGNOSIS — F419 Anxiety disorder, unspecified: Secondary | ICD-10-CM | POA: Diagnosis not present

## 2019-05-19 DIAGNOSIS — E1151 Type 2 diabetes mellitus with diabetic peripheral angiopathy without gangrene: Secondary | ICD-10-CM | POA: Diagnosis not present

## 2019-05-19 DIAGNOSIS — I120 Hypertensive chronic kidney disease with stage 5 chronic kidney disease or end stage renal disease: Secondary | ICD-10-CM | POA: Diagnosis not present

## 2019-05-19 DIAGNOSIS — Z9181 History of falling: Secondary | ICD-10-CM | POA: Diagnosis not present

## 2019-05-19 DIAGNOSIS — F329 Major depressive disorder, single episode, unspecified: Secondary | ICD-10-CM | POA: Diagnosis not present

## 2019-05-19 DIAGNOSIS — E1142 Type 2 diabetes mellitus with diabetic polyneuropathy: Secondary | ICD-10-CM | POA: Diagnosis not present

## 2019-05-19 DIAGNOSIS — E785 Hyperlipidemia, unspecified: Secondary | ICD-10-CM | POA: Diagnosis not present

## 2019-05-19 DIAGNOSIS — K219 Gastro-esophageal reflux disease without esophagitis: Secondary | ICD-10-CM | POA: Diagnosis not present

## 2019-05-19 DIAGNOSIS — E1122 Type 2 diabetes mellitus with diabetic chronic kidney disease: Secondary | ICD-10-CM | POA: Diagnosis not present

## 2019-05-19 DIAGNOSIS — Z794 Long term (current) use of insulin: Secondary | ICD-10-CM | POA: Diagnosis not present

## 2019-05-19 DIAGNOSIS — Z4781 Encounter for orthopedic aftercare following surgical amputation: Secondary | ICD-10-CM | POA: Diagnosis not present

## 2019-05-19 DIAGNOSIS — G43909 Migraine, unspecified, not intractable, without status migrainosus: Secondary | ICD-10-CM | POA: Diagnosis not present

## 2019-05-19 DIAGNOSIS — J449 Chronic obstructive pulmonary disease, unspecified: Secondary | ICD-10-CM | POA: Diagnosis not present

## 2019-05-19 DIAGNOSIS — D509 Iron deficiency anemia, unspecified: Secondary | ICD-10-CM | POA: Diagnosis not present

## 2019-05-19 DIAGNOSIS — Z7902 Long term (current) use of antithrombotics/antiplatelets: Secondary | ICD-10-CM | POA: Diagnosis not present

## 2019-05-19 DIAGNOSIS — F17210 Nicotine dependence, cigarettes, uncomplicated: Secondary | ICD-10-CM | POA: Diagnosis not present

## 2019-05-19 DIAGNOSIS — N2581 Secondary hyperparathyroidism of renal origin: Secondary | ICD-10-CM | POA: Diagnosis not present

## 2019-05-19 DIAGNOSIS — M1990 Unspecified osteoarthritis, unspecified site: Secondary | ICD-10-CM | POA: Diagnosis not present

## 2019-05-19 DIAGNOSIS — N186 End stage renal disease: Secondary | ICD-10-CM | POA: Diagnosis not present

## 2019-05-19 NOTE — Patient Outreach (Signed)
[place] [facility]) Care Management  [place] 05/19/2019  [name] 07/10/1951 [id number]  Incoming call from patient stating he has not received patient assistance program application yet for Toujeo.  Reviewed with patient that application was mailed last Tuesday and hopefully should be arriving any day.  Patient voiced understanding and will check mail closely.  If need be, [facility] can fax application to PCP office for patient to fill out.  Provided patient with phone number for [facility] pharmacy technician Etter Sjogren if he needs to reach out regarding questions about application during the next week as I will be out of the office.   Ralene Bathe, PharmD, [place] [phone]

## 2019-05-20 ENCOUNTER — Other Ambulatory Visit: Payer: Self-pay | Admitting: *Deleted

## 2019-05-20 DIAGNOSIS — N186 End stage renal disease: Secondary | ICD-10-CM | POA: Diagnosis not present

## 2019-05-20 DIAGNOSIS — Z4781 Encounter for orthopedic aftercare following surgical amputation: Secondary | ICD-10-CM | POA: Diagnosis not present

## 2019-05-20 DIAGNOSIS — I120 Hypertensive chronic kidney disease with stage 5 chronic kidney disease or end stage renal disease: Secondary | ICD-10-CM | POA: Diagnosis not present

## 2019-05-20 DIAGNOSIS — E1122 Type 2 diabetes mellitus with diabetic chronic kidney disease: Secondary | ICD-10-CM | POA: Diagnosis not present

## 2019-05-20 DIAGNOSIS — E1151 Type 2 diabetes mellitus with diabetic peripheral angiopathy without gangrene: Secondary | ICD-10-CM | POA: Diagnosis not present

## 2019-05-20 DIAGNOSIS — E1142 Type 2 diabetes mellitus with diabetic polyneuropathy: Secondary | ICD-10-CM | POA: Diagnosis not present

## 2019-05-20 NOTE — Patient Outreach (Addendum)
[place] [facility]) Care Management  05/20/2019  [name] 09-Jun-1951 [id number]   Telephone Screen  Referral Date:05/04/19 Referral Source:THN hospital liaison Referral Reason: Please assign to community nurse for complex care and disease management follow up calls patient with high risk for unplanned readmissions and assess for further needs. Follow progress with home health and wound VAC progress. Insurance:NextGen Medicare and Blue cross and blue shield supplement He confirms he had medicaid in 2018 when he was in a snf but has not qualified for medicaid since.   Admissions x 4 (12/01/18, 12/03/18, 02/11/19, 04/27/19) ED visits x 6 ( 11/27/18, 12/01/18 02/08/19, 02/11/19 x 2, 02/21/19) Last admission [facility] 04/27/19 -05/02/19- left leg nonhealing stump s/p AKA [facility]   Outreach attempt #3successful to the home number    Patient is able to verify HIPAA, DOB and Address Reviewed and addressed complex CM referral to [facility] patient  12/28/18 scanned DPR (designated Party Release) with permission to speak with his wife, [name] and daughter [name]   Transition of care services noted to be completed by primary care MD office staff- [name] at [facility] .  Today's assessment [name] reports he is doing well today He confirmed he had dialysis on Tuesday 05/19/19 and it went well. His stump incision is dry, without drainage and not reddened. He reports he still has some pain He confirms he was not d/c home with a wound vac. Dr [name] is aware.  He has been seen by a [facility] RN and supplies provided for him to change his dressing qd with another dry dressing. Scheduled for a [facility] RN visit on 05/22/19   Voiced concern today 05/20/19  He has noted leaking of oily fluid from the cushion of his w/c that is label as an [facility] rental. It is now flat in the back and not providing much support to his bottom. He has  had this cushion for about 4 months   HTN He reports his BP varies. On 05/19/19 he reports when he got to dialysis the BP was ~ 156/80 went down to 063 systolic but after dialysis at times it has been down to 74/42  He reports preference not to take his HTN medication on his dialysis days to prevent hypotension, "drops"   Diabetes cbg 45 this am, 05/20/19. He reports "shaking when his cbg is low. He states the cbg increased to 81 after eating a peanut and jelly sandwich. He states he take 54 units of insulin at night and a sliding scale during the day. He reports he eats pastry after he takes his night insulin to attempt to prevent hypoglycemia in the am.  He confirms not being followed by an endocrinologist. [facility] RN CM discussed endocrinologists He states he has developed a plan to manage his DM but will discuss with [facility] RN CM at a later time if an endocrinology would benefit him He received DM education at the [facility] drug store,He obtained DM nutrition education and recipes but  is not able to afford the food recommended  He continues to maintain his 32 ounce fluid restriction per day He reports most days he is not able to get in a total of 32 ounces as he notices an increase in his weight. He voiced concern that when this occurs there are attempts to remove more fluid from him during dialysis. He has spoke to Dr Juanda Crumble about this   Social:  [name] is  a 68 year old retired patient who lives with wife [name], his primary caregiver. He reports he is independent/assist with his care needs  He has transportation to medical appointments  Conditions:  left leg nonhealing stump s/p AKA, PVD, HTN, DM with ESRD, Hemodialysis on Tuesdays, Thursdays and Saturdays,  hypoglycemia, DM peripheral neuropathy, phantom limb pain, anxiety disorder, sleep disturbance, chronic anemia,   DME: cbg monitor, eyeglasses, BP cuff, dentures, cane, w/c, walker, grab bars in shower, shower chair with back, grab bars  around toilet, bedside commode/3 in 1 wound vac   Medications: Patient continues to report that [employer] "takes all my money" He is working with [place] on his medication concerns He is awaiting a patient assistance application to arrive in the mail He spoke with [facility] pharmacy staff on 05/19/19  Appointments:  06/04/19 1345 Dr [name] staple remover vascular in [place]:  Reports he has advance directives at this time and is not interested in making any changes    Consent: [facility]N RN CM reviewed [facility] services with patient. Patient gave verbal consent for [facility] telephonic RN CM and [facility] pharmacy services.    Plan: [facility] RN CM will follow up with [name] in the next 14-21 business days to follow up on his home care, DME and education on home care.   Pt encouraged to return a call to [facility] RN CM prn He was given CM contact information pending letter  Barrier of involvement letter sent to primary care MD   Routed note to MDs  [place] Problem One     Most Recent Value  Care Plan Problem One  Diabetes home management leading to recent right AKA and previous wound on left heal  Role Documenting the Problem One  Care Management Telephonic Coordinator  Care Plan for Problem One  Active  THN Long Term Goal   over the next 60 days patient with verbalize understanding of diabetes home care and will not be re admitted to hospital  [place] Term Goal Start Date  05/06/19  Interventions for Problem One Long Term Goal  Assessed DM and HTN home care/management, discussed endocrinologists,   THN CM Short Term Goal #1   over the next 7 days patient will have home health nurse services  [facility] CM Short Term Goal #1 Start Date  05/06/19  [facility] CM Short Term Goal #1 Met Date  05/21/19  THN CM Short Term Goal #2   over the next 7 days patient will have a [facility] pharmcy consult for medication assistance  THN CM Short Term Goal #2 Start Date  05/06/19  Interventions for Short  Term Goal #2  assess for pharmacy staff contact, offer assistance prn, encouraged updates with pharmacy needs, to follow up pharmacy needs during the next cal       [place]. Lavina Hamman, RN, BSN, [place] Coordinator Office number [phone] Mobile number [phone]  Main THN number [phone] Fax number [phone]

## 2019-05-21 ENCOUNTER — Other Ambulatory Visit: Payer: Self-pay | Admitting: *Deleted

## 2019-05-21 DIAGNOSIS — N186 End stage renal disease: Secondary | ICD-10-CM | POA: Diagnosis not present

## 2019-05-21 DIAGNOSIS — D631 Anemia in chronic kidney disease: Secondary | ICD-10-CM | POA: Diagnosis not present

## 2019-05-21 DIAGNOSIS — E1122 Type 2 diabetes mellitus with diabetic chronic kidney disease: Secondary | ICD-10-CM | POA: Diagnosis not present

## 2019-05-21 DIAGNOSIS — N2581 Secondary hyperparathyroidism of renal origin: Secondary | ICD-10-CM | POA: Diagnosis not present

## 2019-05-21 DIAGNOSIS — Z992 Dependence on renal dialysis: Secondary | ICD-10-CM | POA: Diagnosis not present

## 2019-05-21 DIAGNOSIS — E1151 Type 2 diabetes mellitus with diabetic peripheral angiopathy without gangrene: Secondary | ICD-10-CM | POA: Diagnosis not present

## 2019-05-21 DIAGNOSIS — Z4781 Encounter for orthopedic aftercare following surgical amputation: Secondary | ICD-10-CM | POA: Diagnosis not present

## 2019-05-21 DIAGNOSIS — I120 Hypertensive chronic kidney disease with stage 5 chronic kidney disease or end stage renal disease: Secondary | ICD-10-CM | POA: Diagnosis not present

## 2019-05-21 DIAGNOSIS — E1142 Type 2 diabetes mellitus with diabetic polyneuropathy: Secondary | ICD-10-CM | POA: Diagnosis not present

## 2019-05-21 DIAGNOSIS — D509 Iron deficiency anemia, unspecified: Secondary | ICD-10-CM | POA: Diagnosis not present

## 2019-05-21 NOTE — Patient Outreach (Addendum)
[place]) Care Management  05/21/2019  MARSHAL ESKEW 01-19-1951 [id number]   Care coordination  Spoke with Felicia at [facility] [facility] about Mr Berendt concerns with his w/c gel cushion leaking fluids. Felicia shared that the cushion may be possible able to be swapped out at the retail store   [place] was transferred to [place] who shared that the gel cushion may be swapped out at the retail store  The available retail store is located at [place] [address] [place] with [place] at [phone] x [phone] Left message for staff at [place] location to inquire if DME can be shipped/mailed Pending return call   Plan: [place] will follow up with Mr Spivack in the next 14-21 business days to follow up on his home care, DME and education on home care.  Pt encouraged to return a call to [place] prnHe was given CM contact information pending letter  Joelene Millin [name], RN, BSN, [place] Coordinator Office number [phone] Mobile number [phone]  Main THN number [phone] Fax number [phone]

## 2019-05-23 DIAGNOSIS — Z992 Dependence on renal dialysis: Secondary | ICD-10-CM | POA: Diagnosis not present

## 2019-05-23 DIAGNOSIS — D631 Anemia in chronic kidney disease: Secondary | ICD-10-CM | POA: Diagnosis not present

## 2019-05-23 DIAGNOSIS — N186 End stage renal disease: Secondary | ICD-10-CM | POA: Diagnosis not present

## 2019-05-23 DIAGNOSIS — N2581 Secondary hyperparathyroidism of renal origin: Secondary | ICD-10-CM | POA: Diagnosis not present

## 2019-05-23 DIAGNOSIS — D509 Iron deficiency anemia, unspecified: Secondary | ICD-10-CM | POA: Diagnosis not present

## 2019-05-25 DIAGNOSIS — N186 End stage renal disease: Secondary | ICD-10-CM | POA: Diagnosis not present

## 2019-05-25 DIAGNOSIS — E1122 Type 2 diabetes mellitus with diabetic chronic kidney disease: Secondary | ICD-10-CM | POA: Diagnosis not present

## 2019-05-25 DIAGNOSIS — E1151 Type 2 diabetes mellitus with diabetic peripheral angiopathy without gangrene: Secondary | ICD-10-CM | POA: Diagnosis not present

## 2019-05-25 DIAGNOSIS — Z4781 Encounter for orthopedic aftercare following surgical amputation: Secondary | ICD-10-CM | POA: Diagnosis not present

## 2019-05-25 DIAGNOSIS — E1142 Type 2 diabetes mellitus with diabetic polyneuropathy: Secondary | ICD-10-CM | POA: Diagnosis not present

## 2019-05-25 DIAGNOSIS — I120 Hypertensive chronic kidney disease with stage 5 chronic kidney disease or end stage renal disease: Secondary | ICD-10-CM | POA: Diagnosis not present

## 2019-05-26 ENCOUNTER — Other Ambulatory Visit: Payer: Self-pay | Admitting: *Deleted

## 2019-05-26 ENCOUNTER — Other Ambulatory Visit: Payer: Self-pay | Admitting: Pharmacy Technician

## 2019-05-26 DIAGNOSIS — D509 Iron deficiency anemia, unspecified: Secondary | ICD-10-CM | POA: Diagnosis not present

## 2019-05-26 DIAGNOSIS — N186 End stage renal disease: Secondary | ICD-10-CM | POA: Diagnosis not present

## 2019-05-26 DIAGNOSIS — D631 Anemia in chronic kidney disease: Secondary | ICD-10-CM | POA: Diagnosis not present

## 2019-05-26 DIAGNOSIS — Z992 Dependence on renal dialysis: Secondary | ICD-10-CM | POA: Diagnosis not present

## 2019-05-26 DIAGNOSIS — N2581 Secondary hyperparathyroidism of renal origin: Secondary | ICD-10-CM | POA: Diagnosis not present

## 2019-05-26 NOTE — Patient Outreach (Signed)
[place] [facility]) Care Management  05/26/2019  [name] October 26, 1950 [id number]    Unsuccessful call placed to patient regarding patient assistance application(s) for [employer] , HIPAA compliant voicemail left.   Follow up:  Will make 2nd call attempt in 3-5 business days if call has not been returnerd.  Maud Deed Chana Bode [place] Certified Pharmacy Technician [place] Management Direct Dial:[phone]

## 2019-05-26 NOTE — Patient Outreach (Addendum)
[facility]) Care Management  05/26/2019  [name] Dec 20, 1950 [id number]   Care coordination- DME, w/c cushion  THN RN CM called [name] HIPPA verified (DOB & Address)   Updated [name] on the status of his w/c cushion  [employer] reports it can be replaced and a new [name] picked up at the retail store at  [place] x [phone]  [name] Reports that his [facility] PT states she will have Dr Quillian Quince write a Rx or order to be sent to the [place] for the w/c cushion and a new walker This is still pending per [name]. THN RN CM without access to notes for Dr Quillian Quince office   [name] states it would be nice if the DME could be delivered He and [name] again verified by looking at the w/c and cushion that the items are rental from [facility]. THN RN CM explained to them that [facility] DME vendor is now called [employer]   Plan [facility] RN CM will follow up with [name] in the next14-21business days to follow up on his home care, DME and education on home care.  Pt encouraged to return a call to [facility] RN CM prnHe was given CM contact information pending letter  [name]     Most Recent Value  Care Plan Problem [name]  Diabetes home management leading to recent right AKA and previous wound on left heal  Role Documenting the Problem [name]  Care Management Telephonic Coordinator  Care Plan for Problem [name]  Active  [place] Long Term [name]   over the next 60 days patient with verbalize understanding of diabetes home care and will not be re admitted to hospital  [name] Start Date  05/06/19  Interventions for Problem [name] Long Term [name]  Assessment of medical conditions, answer questions, provided education  [place] CM Short Term [name] #1   over the next 7 days patient will have home health nurse services  [facility] CM Short Term [name] #1 Start Date  05/06/19  [facility] CM Short Term [name] #1 Met Date  05/21/19   [place] CM Short Term [name] #2   over the next 7 days patient will have a [facility] pharmcy consult for medication assistance  Cl[place] CM Short Term [name] #2 Start Date  05/06/19  [facility] CM Short Term [name] #2 Met Date  05/26/19  [place] CM Short Term [name] #3  over teh next 14 days patient will have needed DME for w/c and walker if possible via DME supplier as evidence of patient verbalizing he received items during follow up call to him  [facility] CM Short Term [name] #3 Start Date  05/26/19  Interventions for Short [place] [name] #3  calls were placed o [employer] on 10/7 & 03/2019, updated patient on result of calls, to follow up on next call       [place]. Lavina Hamman, RN, BSN, [place] Coordinator Office number [phone] Mobile number [phone]  Main [place] number [phone] Fax number [phone]

## 2019-05-27 DIAGNOSIS — E1142 Type 2 diabetes mellitus with diabetic polyneuropathy: Secondary | ICD-10-CM | POA: Diagnosis not present

## 2019-05-27 DIAGNOSIS — Z4781 Encounter for orthopedic aftercare following surgical amputation: Secondary | ICD-10-CM | POA: Diagnosis not present

## 2019-05-27 DIAGNOSIS — E1151 Type 2 diabetes mellitus with diabetic peripheral angiopathy without gangrene: Secondary | ICD-10-CM | POA: Diagnosis not present

## 2019-05-27 DIAGNOSIS — I120 Hypertensive chronic kidney disease with stage 5 chronic kidney disease or end stage renal disease: Secondary | ICD-10-CM | POA: Diagnosis not present

## 2019-05-27 DIAGNOSIS — E1122 Type 2 diabetes mellitus with diabetic chronic kidney disease: Secondary | ICD-10-CM | POA: Diagnosis not present

## 2019-05-27 DIAGNOSIS — N186 End stage renal disease: Secondary | ICD-10-CM | POA: Diagnosis not present

## 2019-05-28 ENCOUNTER — Other Ambulatory Visit: Payer: Self-pay | Admitting: *Deleted

## 2019-05-28 DIAGNOSIS — N186 End stage renal disease: Secondary | ICD-10-CM | POA: Diagnosis not present

## 2019-05-28 DIAGNOSIS — D509 Iron deficiency anemia, unspecified: Secondary | ICD-10-CM | POA: Diagnosis not present

## 2019-05-28 DIAGNOSIS — D631 Anemia in chronic kidney disease: Secondary | ICD-10-CM | POA: Diagnosis not present

## 2019-05-28 DIAGNOSIS — N2581 Secondary hyperparathyroidism of renal origin: Secondary | ICD-10-CM | POA: Diagnosis not present

## 2019-05-28 DIAGNOSIS — Z992 Dependence on renal dialysis: Secondary | ICD-10-CM | POA: Diagnosis not present

## 2019-05-28 NOTE — Patient Outreach (Signed)
[place] [facility]) Care Management  05/28/2019  [name] [birth year] [id number]   Care coordination  Call to [facility] Dr Quillian Quince [phone] Spoke with Lenna Sciara, receptionist, at the office to inquire about orders for a w/c cushion and a walker Melissa is able to see and confirm the orders were sent to the [place] will follow up with Mr Carchi in the next14-21business days to follow up on his home care, DME and education on home care.   Tildon Silveria [name], RN, BSN, [place] Coordinator Office number [phone] Mobile number [phone]  Main THN number [phone] Fax number [phone]

## 2019-05-30 DIAGNOSIS — N186 End stage renal disease: Secondary | ICD-10-CM | POA: Diagnosis not present

## 2019-05-30 DIAGNOSIS — E1151 Type 2 diabetes mellitus with diabetic peripheral angiopathy without gangrene: Secondary | ICD-10-CM | POA: Diagnosis not present

## 2019-05-30 DIAGNOSIS — N2581 Secondary hyperparathyroidism of renal origin: Secondary | ICD-10-CM | POA: Diagnosis not present

## 2019-05-30 DIAGNOSIS — I120 Hypertensive chronic kidney disease with stage 5 chronic kidney disease or end stage renal disease: Secondary | ICD-10-CM | POA: Diagnosis not present

## 2019-05-30 DIAGNOSIS — E1142 Type 2 diabetes mellitus with diabetic polyneuropathy: Secondary | ICD-10-CM | POA: Diagnosis not present

## 2019-05-30 DIAGNOSIS — D509 Iron deficiency anemia, unspecified: Secondary | ICD-10-CM | POA: Diagnosis not present

## 2019-05-30 DIAGNOSIS — D631 Anemia in chronic kidney disease: Secondary | ICD-10-CM | POA: Diagnosis not present

## 2019-05-30 DIAGNOSIS — Z992 Dependence on renal dialysis: Secondary | ICD-10-CM | POA: Diagnosis not present

## 2019-05-30 DIAGNOSIS — E1122 Type 2 diabetes mellitus with diabetic chronic kidney disease: Secondary | ICD-10-CM | POA: Diagnosis not present

## 2019-05-30 DIAGNOSIS — Z4781 Encounter for orthopedic aftercare following surgical amputation: Secondary | ICD-10-CM | POA: Diagnosis not present

## 2019-05-31 DIAGNOSIS — S78119A Complete traumatic amputation at level between unspecified hip and knee, initial encounter: Secondary | ICD-10-CM | POA: Diagnosis not present

## 2019-05-31 DIAGNOSIS — E1122 Type 2 diabetes mellitus with diabetic chronic kidney disease: Secondary | ICD-10-CM | POA: Diagnosis not present

## 2019-05-31 DIAGNOSIS — E1142 Type 2 diabetes mellitus with diabetic polyneuropathy: Secondary | ICD-10-CM | POA: Diagnosis not present

## 2019-06-02 DIAGNOSIS — D509 Iron deficiency anemia, unspecified: Secondary | ICD-10-CM | POA: Diagnosis not present

## 2019-06-02 DIAGNOSIS — N2581 Secondary hyperparathyroidism of renal origin: Secondary | ICD-10-CM | POA: Diagnosis not present

## 2019-06-02 DIAGNOSIS — N186 End stage renal disease: Secondary | ICD-10-CM | POA: Diagnosis not present

## 2019-06-02 DIAGNOSIS — D631 Anemia in chronic kidney disease: Secondary | ICD-10-CM | POA: Diagnosis not present

## 2019-06-02 DIAGNOSIS — Z992 Dependence on renal dialysis: Secondary | ICD-10-CM | POA: Diagnosis not present

## 2019-06-04 ENCOUNTER — Other Ambulatory Visit: Payer: Self-pay

## 2019-06-04 ENCOUNTER — Other Ambulatory Visit: Payer: Self-pay | Admitting: *Deleted

## 2019-06-04 ENCOUNTER — Ambulatory Visit (INDEPENDENT_AMBULATORY_CARE_PROVIDER_SITE_OTHER): Payer: Self-pay | Admitting: Physician Assistant

## 2019-06-04 ENCOUNTER — Encounter: Payer: Self-pay | Admitting: Physician Assistant

## 2019-06-04 DIAGNOSIS — D631 Anemia in chronic kidney disease: Secondary | ICD-10-CM | POA: Diagnosis not present

## 2019-06-04 DIAGNOSIS — D509 Iron deficiency anemia, unspecified: Secondary | ICD-10-CM | POA: Diagnosis not present

## 2019-06-04 DIAGNOSIS — Z992 Dependence on renal dialysis: Secondary | ICD-10-CM | POA: Diagnosis not present

## 2019-06-04 DIAGNOSIS — N2581 Secondary hyperparathyroidism of renal origin: Secondary | ICD-10-CM | POA: Diagnosis not present

## 2019-06-04 DIAGNOSIS — I739 Peripheral vascular disease, unspecified: Secondary | ICD-10-CM

## 2019-06-04 DIAGNOSIS — N186 End stage renal disease: Secondary | ICD-10-CM | POA: Diagnosis not present

## 2019-06-04 NOTE — Progress Notes (Signed)
POST OPERATIVE OFFICE NOTE    CC:  F/u for surgery  HPI:  This is a 68 y.o. male who is s/p revision non healing right BKA.  He is here for incision check and staple removal.  He denise pain, fever or chills.  No Known Allergies  Current Outpatient Medications  Medication Sig Dispense Refill  . albuterol (VENTOLIN HFA) 108 (90 Base) MCG/ACT inhaler Inhale 1-2 puffs into the lungs every 6 (six) hours as needed for wheezing or shortness of breath.    . ARTIFICIAL TEAR OP Place 1 drop into both eyes every 6 (six) hours as needed (dry eyes).    Marland Kitchen atorvastatin (LIPITOR) 80 MG tablet Take 1 tablet (80 mg total) by mouth at bedtime. (Patient taking differently: Take 80 mg by mouth daily. ) 30 tablet 5  . bismuth subsalicylate (PEPTO BISMOL) 262 MG/15ML suspension Take 30 mLs by mouth every 6 (six) hours as needed for indigestion.    . calcium acetate (PHOSLO) 667 MG capsule Take 4 capsules (2,668 mg total) by mouth 3 (three) times daily with meals. (Patient taking differently: Take [id number] mg by mouth See admin instructions. Take 2668 mg with meals and 1334 with each snack) 360 capsule 0  . cinacalcet (SENSIPAR) 30 MG tablet Take 1 tablet (30 mg total) by mouth every evening. 60 tablet 0  . clopidogrel (PLAVIX) 75 MG tablet Take 1 tablet (75 mg total) by mouth daily. 30 tablet 0  . collagenase (SANTYL) ointment Apply 1 application topically daily as needed (wound care).    . diphenhydrAMINE (BENADRYL) 25 MG tablet Take 25 mg by mouth daily as needed for itching.    Marland Kitchen ibuprofen (ADVIL) 200 MG tablet Take 800 mg by mouth 3 (three) times daily as needed for headache or moderate pain.    Marland Kitchen levothyroxine (SYNTHROID) 125 MCG tablet Take 1 tablet (125 mcg total) by mouth daily before breakfast. (Patient taking differently: Take 125 mcg by mouth at bedtime. ) 30 tablet 0  . lisinopril (ZESTRIL) 10 MG tablet Take 10 mg by mouth daily.    . Melatonin 10 MG CAPS Take 10 mg by mouth at bedtime.    Marland Kitchen  oxyCODONE (OXY IR/ROXICODONE) 5 MG immediate release tablet Take 1 tablet (5 mg total) by mouth every 6 (six) hours as needed for moderate pain. 25 tablet 0  . polyethylene glycol (MIRALAX / GLYCOLAX) 17 g packet Take 17 g by mouth daily as needed for moderate constipation.    . pregabalin (LYRICA) 150 MG capsule Take 1 capsule (150 mg total) by mouth 3 (three) times daily. 90 capsule 0  . Sennosides 15 MG TABS Take 1 tablet by mouth as needed.    [name] 300 UNIT/ML SOPN Inject 54-84 Units as directed at bedtime as needed (if blood sugar is 140 or higher).      No current facility-administered medications for this visit.      ROS:  See HPI  Physical Exam:      Incision:  Well healed right AKA stump incision Extremities:  Warm to touch without erythema or openings in the skin. Lungs non labored breathing Heart RRR  Assessment/Plan:  This is a 68 y.o. 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  We will have him return in 3 months for surveillance ABI on the left LE.  He will call [facility] for f/u visit and shrinker  Therapy.  He continues to smoke daily and has  thought about quitting.  I advised him that is the best thing he can do is stop smoking to protect his left LE from PAD.      Roxy Horseman PA-C [facility] [phone]  Clinic MD:  [name]

## 2019-06-04 NOTE — Patient Outreach (Signed)
[place] [facility]) Care Management  06/04/2019  CHRISOPHER PUSTEJOVSKY [birth year] [id number]   Telephone Screen  Referral Date:05/04/19 Referral Source:THN hospital liaison Referral Reason: Please assign to community nurse for complex care and disease management follow up calls patient with high risk for unplanned readmissions and assess for further needs. Follow progress with home health and wound VAC progress. Insurance:NextGen Medicareand Blue cross and blue shield supplement He confirms he had medicaid in 2018 when he was in a snf but has not qualified for medicaid since.   Admissionsx 4 (12/01/18, 12/03/18, 02/11/19, 04/27/19) ED visits x 6 ( 11/27/18, 12/01/18 02/08/19, 02/11/19 x 2, 02/21/19) Last admission [facility] 04/27/19 -05/02/19- left leg nonhealing stump s/p AKA with [facility]  Outreach attempt # 1 for f/u on care coordination of DME  No answer. THN RN CM left HIPAA compliant voicemail message along with CM's contact info.   Plan: [facility] RN CM scheduled this THN engaged patient for another call attempt within 7 business days  Lauralyn Shadowens [name], RN, BSN, [place] Coordinator Office number [phone] Mobile number [phone]  Main THN number [phone] Fax number [phone]

## 2019-06-05 ENCOUNTER — Other Ambulatory Visit: Payer: Self-pay | Admitting: Pharmacy Technician

## 2019-06-05 DIAGNOSIS — I120 Hypertensive chronic kidney disease with stage 5 chronic kidney disease or end stage renal disease: Secondary | ICD-10-CM | POA: Diagnosis not present

## 2019-06-05 DIAGNOSIS — Z4781 Encounter for orthopedic aftercare following surgical amputation: Secondary | ICD-10-CM | POA: Diagnosis not present

## 2019-06-05 DIAGNOSIS — E1151 Type 2 diabetes mellitus with diabetic peripheral angiopathy without gangrene: Secondary | ICD-10-CM | POA: Diagnosis not present

## 2019-06-05 DIAGNOSIS — E1122 Type 2 diabetes mellitus with diabetic chronic kidney disease: Secondary | ICD-10-CM | POA: Diagnosis not present

## 2019-06-05 DIAGNOSIS — N186 End stage renal disease: Secondary | ICD-10-CM | POA: Diagnosis not present

## 2019-06-05 DIAGNOSIS — E1142 Type 2 diabetes mellitus with diabetic polyneuropathy: Secondary | ICD-10-CM | POA: Diagnosis not present

## 2019-06-05 NOTE — Patient Outreach (Signed)
[place] [facility]) Care Management  06/05/2019  [name] Dec 19, 1950 [id number]    Successful call placed to patient regarding patient assistance application(s) for [employer] , HIPAA identifiers verified. Mr. Shell confirms that he received application and mailed it back in 2-3 days ago.  Follow up:  Will submit to [employer] once documents have been received.  Maud Deed Chana Bode [place] Certified Pharmacy Technician [place] Management Direct Dial:[phone]

## 2019-06-06 DIAGNOSIS — N2581 Secondary hyperparathyroidism of renal origin: Secondary | ICD-10-CM | POA: Diagnosis not present

## 2019-06-06 DIAGNOSIS — N186 End stage renal disease: Secondary | ICD-10-CM | POA: Diagnosis not present

## 2019-06-06 DIAGNOSIS — D509 Iron deficiency anemia, unspecified: Secondary | ICD-10-CM | POA: Diagnosis not present

## 2019-06-06 DIAGNOSIS — D631 Anemia in chronic kidney disease: Secondary | ICD-10-CM | POA: Diagnosis not present

## 2019-06-06 DIAGNOSIS — Z992 Dependence on renal dialysis: Secondary | ICD-10-CM | POA: Diagnosis not present

## 2019-06-08 DIAGNOSIS — E1122 Type 2 diabetes mellitus with diabetic chronic kidney disease: Secondary | ICD-10-CM | POA: Diagnosis not present

## 2019-06-08 DIAGNOSIS — N186 End stage renal disease: Secondary | ICD-10-CM | POA: Diagnosis not present

## 2019-06-08 DIAGNOSIS — E1142 Type 2 diabetes mellitus with diabetic polyneuropathy: Secondary | ICD-10-CM | POA: Diagnosis not present

## 2019-06-08 DIAGNOSIS — Z4781 Encounter for orthopedic aftercare following surgical amputation: Secondary | ICD-10-CM | POA: Diagnosis not present

## 2019-06-08 DIAGNOSIS — E1151 Type 2 diabetes mellitus with diabetic peripheral angiopathy without gangrene: Secondary | ICD-10-CM | POA: Diagnosis not present

## 2019-06-08 DIAGNOSIS — I120 Hypertensive chronic kidney disease with stage 5 chronic kidney disease or end stage renal disease: Secondary | ICD-10-CM | POA: Diagnosis not present

## 2019-06-09 ENCOUNTER — Telehealth: Payer: Self-pay

## 2019-06-09 DIAGNOSIS — N186 End stage renal disease: Secondary | ICD-10-CM | POA: Diagnosis not present

## 2019-06-09 DIAGNOSIS — Z992 Dependence on renal dialysis: Secondary | ICD-10-CM | POA: Diagnosis not present

## 2019-06-09 DIAGNOSIS — N2581 Secondary hyperparathyroidism of renal origin: Secondary | ICD-10-CM | POA: Diagnosis not present

## 2019-06-09 DIAGNOSIS — D631 Anemia in chronic kidney disease: Secondary | ICD-10-CM | POA: Diagnosis not present

## 2019-06-09 DIAGNOSIS — D509 Iron deficiency anemia, unspecified: Secondary | ICD-10-CM | POA: Diagnosis not present

## 2019-06-09 NOTE — Telephone Encounter (Signed)
Pt said that he noticed that he had a staple left in. Just one and wanted to know if home health could take it out.   Left message advising that if she calls when she is in the home we can give her a verbal and this will likely be faster than sending a form that may not get to her before she is out.   York Cerise, CMA

## 2019-06-11 DIAGNOSIS — D631 Anemia in chronic kidney disease: Secondary | ICD-10-CM | POA: Diagnosis not present

## 2019-06-11 DIAGNOSIS — N2581 Secondary hyperparathyroidism of renal origin: Secondary | ICD-10-CM | POA: Diagnosis not present

## 2019-06-11 DIAGNOSIS — N186 End stage renal disease: Secondary | ICD-10-CM | POA: Diagnosis not present

## 2019-06-11 DIAGNOSIS — D509 Iron deficiency anemia, unspecified: Secondary | ICD-10-CM | POA: Diagnosis not present

## 2019-06-11 DIAGNOSIS — Z992 Dependence on renal dialysis: Secondary | ICD-10-CM | POA: Diagnosis not present

## 2019-06-12 DIAGNOSIS — E1122 Type 2 diabetes mellitus with diabetic chronic kidney disease: Secondary | ICD-10-CM | POA: Diagnosis not present

## 2019-06-12 DIAGNOSIS — E1151 Type 2 diabetes mellitus with diabetic peripheral angiopathy without gangrene: Secondary | ICD-10-CM | POA: Diagnosis not present

## 2019-06-12 DIAGNOSIS — Z4781 Encounter for orthopedic aftercare following surgical amputation: Secondary | ICD-10-CM | POA: Diagnosis not present

## 2019-06-12 DIAGNOSIS — I120 Hypertensive chronic kidney disease with stage 5 chronic kidney disease or end stage renal disease: Secondary | ICD-10-CM | POA: Diagnosis not present

## 2019-06-12 DIAGNOSIS — E1142 Type 2 diabetes mellitus with diabetic polyneuropathy: Secondary | ICD-10-CM | POA: Diagnosis not present

## 2019-06-12 DIAGNOSIS — N186 End stage renal disease: Secondary | ICD-10-CM | POA: Diagnosis not present

## 2019-06-13 DIAGNOSIS — D631 Anemia in chronic kidney disease: Secondary | ICD-10-CM | POA: Diagnosis not present

## 2019-06-13 DIAGNOSIS — D509 Iron deficiency anemia, unspecified: Secondary | ICD-10-CM | POA: Diagnosis not present

## 2019-06-13 DIAGNOSIS — Z992 Dependence on renal dialysis: Secondary | ICD-10-CM | POA: Diagnosis not present

## 2019-06-13 DIAGNOSIS — N186 End stage renal disease: Secondary | ICD-10-CM | POA: Diagnosis not present

## 2019-06-13 DIAGNOSIS — N2581 Secondary hyperparathyroidism of renal origin: Secondary | ICD-10-CM | POA: Diagnosis not present

## 2019-06-14 DIAGNOSIS — N2581 Secondary hyperparathyroidism of renal origin: Secondary | ICD-10-CM | POA: Diagnosis not present

## 2019-06-14 DIAGNOSIS — D509 Iron deficiency anemia, unspecified: Secondary | ICD-10-CM | POA: Diagnosis not present

## 2019-06-14 DIAGNOSIS — D631 Anemia in chronic kidney disease: Secondary | ICD-10-CM | POA: Diagnosis not present

## 2019-06-14 DIAGNOSIS — Z992 Dependence on renal dialysis: Secondary | ICD-10-CM | POA: Diagnosis not present

## 2019-06-14 DIAGNOSIS — N186 End stage renal disease: Secondary | ICD-10-CM | POA: Diagnosis not present

## 2019-06-15 DIAGNOSIS — Z4781 Encounter for orthopedic aftercare following surgical amputation: Secondary | ICD-10-CM | POA: Diagnosis not present

## 2019-06-15 DIAGNOSIS — E1142 Type 2 diabetes mellitus with diabetic polyneuropathy: Secondary | ICD-10-CM | POA: Diagnosis not present

## 2019-06-15 DIAGNOSIS — E1151 Type 2 diabetes mellitus with diabetic peripheral angiopathy without gangrene: Secondary | ICD-10-CM | POA: Diagnosis not present

## 2019-06-15 DIAGNOSIS — E1122 Type 2 diabetes mellitus with diabetic chronic kidney disease: Secondary | ICD-10-CM | POA: Diagnosis not present

## 2019-06-15 DIAGNOSIS — I120 Hypertensive chronic kidney disease with stage 5 chronic kidney disease or end stage renal disease: Secondary | ICD-10-CM | POA: Diagnosis not present

## 2019-06-15 DIAGNOSIS — N186 End stage renal disease: Secondary | ICD-10-CM | POA: Diagnosis not present

## 2019-06-16 DIAGNOSIS — D631 Anemia in chronic kidney disease: Secondary | ICD-10-CM | POA: Diagnosis not present

## 2019-06-16 DIAGNOSIS — N186 End stage renal disease: Secondary | ICD-10-CM | POA: Diagnosis not present

## 2019-06-16 DIAGNOSIS — D509 Iron deficiency anemia, unspecified: Secondary | ICD-10-CM | POA: Diagnosis not present

## 2019-06-16 DIAGNOSIS — Z992 Dependence on renal dialysis: Secondary | ICD-10-CM | POA: Diagnosis not present

## 2019-06-16 DIAGNOSIS — N2581 Secondary hyperparathyroidism of renal origin: Secondary | ICD-10-CM | POA: Diagnosis not present

## 2019-06-18 DIAGNOSIS — D509 Iron deficiency anemia, unspecified: Secondary | ICD-10-CM | POA: Diagnosis not present

## 2019-06-18 DIAGNOSIS — N2581 Secondary hyperparathyroidism of renal origin: Secondary | ICD-10-CM | POA: Diagnosis not present

## 2019-06-18 DIAGNOSIS — N186 End stage renal disease: Secondary | ICD-10-CM | POA: Diagnosis not present

## 2019-06-18 DIAGNOSIS — Z992 Dependence on renal dialysis: Secondary | ICD-10-CM | POA: Diagnosis not present

## 2019-06-18 DIAGNOSIS — D631 Anemia in chronic kidney disease: Secondary | ICD-10-CM | POA: Diagnosis not present

## 2019-06-19 ENCOUNTER — Other Ambulatory Visit: Payer: Self-pay | Admitting: Pharmacy Technician

## 2019-06-19 ENCOUNTER — Other Ambulatory Visit: Payer: Self-pay | Admitting: *Deleted

## 2019-06-19 NOTE — Patient Outreach (Addendum)
[place] [facility]) Care Management  06/19/2019  [name] 04-Jun-1951 [id number]   Follow up Telephone Screen/Assessment attempt   [facility] Referral Date:05/04/19 Referral Source:THN hospital liaison Referral Reason: Please assign to community nurse for complex care and disease management follow up calls patient with high risk for unplanned readmissions and assess for further needs. Follow progress with home health and wound VAC progress. Insurance:NextGen Medicareand Blue cross and blue shield supplement He confirms he had medicaid in 2018 when he was in a snf but has not qualified for medicaid since.  Admissionsx 4 (12/01/18, 12/03/18, 02/11/19, 04/27/19) ED visits x 6 ( 11/27/18, 12/01/18 02/08/19, 02/11/19 x 2, 02/21/19) Last admission [facility] 04/27/19 -05/02/19- left leg nonhealing stump s/p AKA with [facility] of [facility]    Outreach attempt # 2 in the last 2 weeks for f/u attempt on care coordination of DME + No answer. THN RN CM left HIPAA compliant voicemail message along with CM's contact info.   Plan: [facility] RN CM scheduled this THN engaged patient for another call attempt within 7 business days and sent a [facility] engaged unsuccessful outreach letter   [place]. Lavina Hamman, RN, BSN, [place] Coordinator Office number [phone] Mobile number [phone]  Main THN number [phone] Fax number [phone]

## 2019-06-19 NOTE — Patient Outreach (Signed)
[place] [facility]) Care Management  06/19/2019  TREON KEHL [birth year] [id number]    Follow up call placed to [place] regarding patient assistance application(s) for Rolland [place]ler confirms patient has been approved as of 06/18/19 until 06/16/2020!!. 7 boxes of medication to be delivered to providers office in 7-10 business days.  Follow up:  Will follow up with patient in 10-14 business days to confirm medication has been received.  Maud Deed Chana Bode [place] Certified Pharmacy Technician [place] Management Direct Dial:[phone]

## 2019-06-20 DIAGNOSIS — D631 Anemia in chronic kidney disease: Secondary | ICD-10-CM | POA: Diagnosis not present

## 2019-06-20 DIAGNOSIS — N186 End stage renal disease: Secondary | ICD-10-CM | POA: Diagnosis not present

## 2019-06-20 DIAGNOSIS — D509 Iron deficiency anemia, unspecified: Secondary | ICD-10-CM | POA: Diagnosis not present

## 2019-06-20 DIAGNOSIS — N2581 Secondary hyperparathyroidism of renal origin: Secondary | ICD-10-CM | POA: Diagnosis not present

## 2019-06-20 DIAGNOSIS — Z992 Dependence on renal dialysis: Secondary | ICD-10-CM | POA: Diagnosis not present

## 2019-06-22 DIAGNOSIS — I10 Essential (primary) hypertension: Secondary | ICD-10-CM | POA: Diagnosis not present

## 2019-06-22 DIAGNOSIS — R42 Dizziness and giddiness: Secondary | ICD-10-CM | POA: Diagnosis not present

## 2019-06-22 DIAGNOSIS — K219 Gastro-esophageal reflux disease without esophagitis: Secondary | ICD-10-CM | POA: Diagnosis not present

## 2019-06-22 DIAGNOSIS — E039 Hypothyroidism, unspecified: Secondary | ICD-10-CM | POA: Diagnosis not present

## 2019-06-22 DIAGNOSIS — E1143 Type 2 diabetes mellitus with diabetic autonomic (poly)neuropathy: Secondary | ICD-10-CM | POA: Diagnosis not present

## 2019-06-22 DIAGNOSIS — E782 Mixed hyperlipidemia: Secondary | ICD-10-CM | POA: Diagnosis not present

## 2019-06-22 DIAGNOSIS — E162 Hypoglycemia, unspecified: Secondary | ICD-10-CM | POA: Diagnosis not present

## 2019-06-23 DIAGNOSIS — D631 Anemia in chronic kidney disease: Secondary | ICD-10-CM | POA: Diagnosis not present

## 2019-06-23 DIAGNOSIS — Z992 Dependence on renal dialysis: Secondary | ICD-10-CM | POA: Diagnosis not present

## 2019-06-23 DIAGNOSIS — N186 End stage renal disease: Secondary | ICD-10-CM | POA: Diagnosis not present

## 2019-06-23 DIAGNOSIS — N2581 Secondary hyperparathyroidism of renal origin: Secondary | ICD-10-CM | POA: Diagnosis not present

## 2019-06-23 DIAGNOSIS — D509 Iron deficiency anemia, unspecified: Secondary | ICD-10-CM | POA: Diagnosis not present

## 2019-06-25 ENCOUNTER — Other Ambulatory Visit: Payer: Self-pay | Admitting: *Deleted

## 2019-06-25 ENCOUNTER — Other Ambulatory Visit: Payer: Self-pay

## 2019-06-25 DIAGNOSIS — D631 Anemia in chronic kidney disease: Secondary | ICD-10-CM | POA: Diagnosis not present

## 2019-06-25 DIAGNOSIS — N186 End stage renal disease: Secondary | ICD-10-CM | POA: Diagnosis not present

## 2019-06-25 DIAGNOSIS — D509 Iron deficiency anemia, unspecified: Secondary | ICD-10-CM | POA: Diagnosis not present

## 2019-06-25 DIAGNOSIS — N2581 Secondary hyperparathyroidism of renal origin: Secondary | ICD-10-CM | POA: Diagnosis not present

## 2019-06-25 DIAGNOSIS — Z992 Dependence on renal dialysis: Secondary | ICD-10-CM | POA: Diagnosis not present

## 2019-06-25 NOTE — Patient Outreach (Signed)
[facility]) Care Management  06/25/2019  [name] 1951-07-26 [id number]   Follow up Telephone Screen/Assessment attempt   [facility] Referral Date:05/04/19 Referral Source:[facility] hospital liaison Referral Reason: Please assign to community nurse for complex care and disease management follow up calls patient with high risk for unplanned readmissions and assess for further needs. Follow progress with home health and wound VAC progress. Insurance:NextGen Medicareand Blue cross and blue shield supplement He confirms he had medicaid in 2018 when he was in a snf but has not qualified for medicaid since.  Admissionsx 4 (12/01/18, 12/03/18, 02/11/19, 04/27/19) ED visits x 6 ( 11/27/18, 12/01/18 02/08/19, 02/11/19 x 2, 02/21/19) Last admission [facility] 04/27/19 -05/02/19- left leg nonhealing stump s/p AKA [facility]    Outreach attempt # 3in the last 4 weeks for f/u attempt on care coordination of DME+  successful outreach to Mr [facility] home number   He reports he has arrived home after Hemodialysis (HD), is "tired: and feels he will need to eat soon He has not been hospitalized since 05/02/19  He updated [facility] RN CM that he has not received his w/c cushion nor walker that [facility] RN CM confirmed with staff at Dr Quillian Quince office had been ordered via [place] on 05/28/19 Mr Turko reports some calls received and was informed he would have to pick it up in "[employer]"  [place] completed a conference call to [employer] with Mr Erny and spoke with Macie Burows confirms all DME orders were received and 3-4 calls and messages had been placed and left at Mr [facility] home number. The case had been closed when there was no response from Mr [place]. Rockne Coons was able to assist by getting [employer]'s intake department to resubmit the orders for processing and for pick up at the closest [place] store at [place] [place]. Mr Jenniges was informed he  would receive again calls from [place] store about the DME items. Mr Tillis's questions about delivery were answered. These items are non deliverable at this time. Mr Canterbury reports a follow up at Dr Arcola Jansky office on 06/26/19 and a visit to [facility] [place] on 07/03/19  [facility] RN CM health coach services was discussed while waiting with [employer] and Mr Redmon reports he would like to continue [facility] services by having calls from [place] health coach for further education on DM and  for monthly to quarterly contact He agrees to calls to his home number preferably  He continues to be followed by [facility] pharmacy  Plan Mr Tesler will be referred to [facility] RN CM health coach services as agreed  Pt encouraged to return a call to [facility] RN CM prn  Routed note to MD  [place] Problem One     Most Recent Value  Care Plan Problem One  Diabetes home management leading to recent right AKA and previous wound on left heal  Role Documenting the Problem One  Care Management Telephonic Coordinator  Care Plan for Problem One  Active  [facility] Long Term Goal   over the next 60 days patient with verbalize understanding of diabetes home care and will not be re admitted to hospital  [place] Term Goal Start Date  05/06/19  Interventions for Problem One Long Term Goal  Discussed [facility] RN CM health coach service in which he agreed to on 06/25/19, Assessed DM and HTN home care/management  [facility] CM Short Term Goal #1  over the next 7 days patient will have home health nurse services  [facility] CM Short Term Goal #1 Start Date  05/06/19  [facility] CM Short Term Goal #1 Met Date  05/21/19  Interventions for Short Term Goal #1  Assess for AKA home care, left message at Dr [name] office requesting orders for home health nurse via [facility] (patient choice)  [facility] CM Short Term Goal #2   over the next 7 days patient will have a [facility] pharmcy consult for medication assistance  [facility] CM Short Term Goal #2 Start Date  05/06/19  [facility] CM Short  Term Goal #2 Met Date  05/26/19  Interventions for Short Term Goal #2  assess for pharmacy staff contact, offer assistance prn, encouraged updates with pharmacy needs, to follow up pharmacy needs during the next call  [facility] CM Short Term Goal #3  over the next 14 days patient will have needed DME for w/c and walker if possible via DME supplier as evidence of patient verbalizing he received items during follow up call to him  [facility] CM Short Term Goal #3 Start Date  06/25/19  Interventions for Short Tern Goal #3  Conference call to [employer] 06/25/19 with pt to clarify that he did not respond to [employer] calls, cased closed but reopened 06/25/19 & for DME pickup in [place], calls were placed o [employer] on 10/7 & 03/2019, updated patient on result of calls, to follow up on next call      [place]. Lavina Hamman, RN, BSN, [place] Coordinator Office number [phone] Mobile number [phone]  Main [facility] number [phone] Fax number [phone]

## 2019-06-26 ENCOUNTER — Other Ambulatory Visit: Payer: Self-pay | Admitting: *Deleted

## 2019-06-26 DIAGNOSIS — Z992 Dependence on renal dialysis: Secondary | ICD-10-CM | POA: Diagnosis not present

## 2019-06-26 DIAGNOSIS — E11621 Type 2 diabetes mellitus with foot ulcer: Secondary | ICD-10-CM | POA: Diagnosis not present

## 2019-06-26 DIAGNOSIS — E1122 Type 2 diabetes mellitus with diabetic chronic kidney disease: Secondary | ICD-10-CM | POA: Diagnosis not present

## 2019-06-26 DIAGNOSIS — E1142 Type 2 diabetes mellitus with diabetic polyneuropathy: Secondary | ICD-10-CM | POA: Diagnosis not present

## 2019-06-26 DIAGNOSIS — Z6830 Body mass index (BMI) 30.0-30.9, adult: Secondary | ICD-10-CM | POA: Diagnosis not present

## 2019-06-26 DIAGNOSIS — I10 Essential (primary) hypertension: Secondary | ICD-10-CM | POA: Diagnosis not present

## 2019-06-26 DIAGNOSIS — Z89511 Acquired absence of right leg below knee: Secondary | ICD-10-CM | POA: Diagnosis not present

## 2019-06-26 DIAGNOSIS — I739 Peripheral vascular disease, unspecified: Secondary | ICD-10-CM | POA: Diagnosis not present

## 2019-06-27 DIAGNOSIS — N2581 Secondary hyperparathyroidism of renal origin: Secondary | ICD-10-CM | POA: Diagnosis not present

## 2019-06-27 DIAGNOSIS — D631 Anemia in chronic kidney disease: Secondary | ICD-10-CM | POA: Diagnosis not present

## 2019-06-27 DIAGNOSIS — N186 End stage renal disease: Secondary | ICD-10-CM | POA: Diagnosis not present

## 2019-06-27 DIAGNOSIS — Z992 Dependence on renal dialysis: Secondary | ICD-10-CM | POA: Diagnosis not present

## 2019-06-27 DIAGNOSIS — D509 Iron deficiency anemia, unspecified: Secondary | ICD-10-CM | POA: Diagnosis not present

## 2019-06-30 DIAGNOSIS — N186 End stage renal disease: Secondary | ICD-10-CM | POA: Diagnosis not present

## 2019-06-30 DIAGNOSIS — N2581 Secondary hyperparathyroidism of renal origin: Secondary | ICD-10-CM | POA: Diagnosis not present

## 2019-06-30 DIAGNOSIS — D631 Anemia in chronic kidney disease: Secondary | ICD-10-CM | POA: Diagnosis not present

## 2019-06-30 DIAGNOSIS — D509 Iron deficiency anemia, unspecified: Secondary | ICD-10-CM | POA: Diagnosis not present

## 2019-06-30 DIAGNOSIS — Z992 Dependence on renal dialysis: Secondary | ICD-10-CM | POA: Diagnosis not present

## 2019-07-02 DIAGNOSIS — D631 Anemia in chronic kidney disease: Secondary | ICD-10-CM | POA: Diagnosis not present

## 2019-07-02 DIAGNOSIS — N186 End stage renal disease: Secondary | ICD-10-CM | POA: Diagnosis not present

## 2019-07-02 DIAGNOSIS — N2581 Secondary hyperparathyroidism of renal origin: Secondary | ICD-10-CM | POA: Diagnosis not present

## 2019-07-02 DIAGNOSIS — D509 Iron deficiency anemia, unspecified: Secondary | ICD-10-CM | POA: Diagnosis not present

## 2019-07-02 DIAGNOSIS — Z992 Dependence on renal dialysis: Secondary | ICD-10-CM | POA: Diagnosis not present

## 2019-07-04 DIAGNOSIS — D631 Anemia in chronic kidney disease: Secondary | ICD-10-CM | POA: Diagnosis not present

## 2019-07-04 DIAGNOSIS — N2581 Secondary hyperparathyroidism of renal origin: Secondary | ICD-10-CM | POA: Diagnosis not present

## 2019-07-04 DIAGNOSIS — D509 Iron deficiency anemia, unspecified: Secondary | ICD-10-CM | POA: Diagnosis not present

## 2019-07-04 DIAGNOSIS — N186 End stage renal disease: Secondary | ICD-10-CM | POA: Diagnosis not present

## 2019-07-04 DIAGNOSIS — Z992 Dependence on renal dialysis: Secondary | ICD-10-CM | POA: Diagnosis not present

## 2019-07-07 DIAGNOSIS — D631 Anemia in chronic kidney disease: Secondary | ICD-10-CM | POA: Diagnosis not present

## 2019-07-07 DIAGNOSIS — D509 Iron deficiency anemia, unspecified: Secondary | ICD-10-CM | POA: Diagnosis not present

## 2019-07-07 DIAGNOSIS — N186 End stage renal disease: Secondary | ICD-10-CM | POA: Diagnosis not present

## 2019-07-07 DIAGNOSIS — Z992 Dependence on renal dialysis: Secondary | ICD-10-CM | POA: Diagnosis not present

## 2019-07-07 DIAGNOSIS — N2581 Secondary hyperparathyroidism of renal origin: Secondary | ICD-10-CM | POA: Diagnosis not present

## 2019-07-09 DIAGNOSIS — D631 Anemia in chronic kidney disease: Secondary | ICD-10-CM | POA: Diagnosis not present

## 2019-07-09 DIAGNOSIS — N186 End stage renal disease: Secondary | ICD-10-CM | POA: Diagnosis not present

## 2019-07-09 DIAGNOSIS — N2581 Secondary hyperparathyroidism of renal origin: Secondary | ICD-10-CM | POA: Diagnosis not present

## 2019-07-09 DIAGNOSIS — D509 Iron deficiency anemia, unspecified: Secondary | ICD-10-CM | POA: Diagnosis not present

## 2019-07-09 DIAGNOSIS — Z992 Dependence on renal dialysis: Secondary | ICD-10-CM | POA: Diagnosis not present

## 2019-07-11 DIAGNOSIS — N2581 Secondary hyperparathyroidism of renal origin: Secondary | ICD-10-CM | POA: Diagnosis not present

## 2019-07-11 DIAGNOSIS — N186 End stage renal disease: Secondary | ICD-10-CM | POA: Diagnosis not present

## 2019-07-11 DIAGNOSIS — D631 Anemia in chronic kidney disease: Secondary | ICD-10-CM | POA: Diagnosis not present

## 2019-07-11 DIAGNOSIS — D509 Iron deficiency anemia, unspecified: Secondary | ICD-10-CM | POA: Diagnosis not present

## 2019-07-11 DIAGNOSIS — Z992 Dependence on renal dialysis: Secondary | ICD-10-CM | POA: Diagnosis not present

## 2019-07-13 DIAGNOSIS — Z992 Dependence on renal dialysis: Secondary | ICD-10-CM | POA: Diagnosis not present

## 2019-07-13 DIAGNOSIS — N186 End stage renal disease: Secondary | ICD-10-CM | POA: Diagnosis not present

## 2019-07-14 DIAGNOSIS — D509 Iron deficiency anemia, unspecified: Secondary | ICD-10-CM | POA: Diagnosis not present

## 2019-07-14 DIAGNOSIS — N186 End stage renal disease: Secondary | ICD-10-CM | POA: Diagnosis not present

## 2019-07-14 DIAGNOSIS — N2581 Secondary hyperparathyroidism of renal origin: Secondary | ICD-10-CM | POA: Diagnosis not present

## 2019-07-14 DIAGNOSIS — D631 Anemia in chronic kidney disease: Secondary | ICD-10-CM | POA: Diagnosis not present

## 2019-07-14 DIAGNOSIS — Z992 Dependence on renal dialysis: Secondary | ICD-10-CM | POA: Diagnosis not present

## 2019-07-16 DIAGNOSIS — D631 Anemia in chronic kidney disease: Secondary | ICD-10-CM | POA: Diagnosis not present

## 2019-07-16 DIAGNOSIS — N186 End stage renal disease: Secondary | ICD-10-CM | POA: Diagnosis not present

## 2019-07-16 DIAGNOSIS — D509 Iron deficiency anemia, unspecified: Secondary | ICD-10-CM | POA: Diagnosis not present

## 2019-07-16 DIAGNOSIS — Z992 Dependence on renal dialysis: Secondary | ICD-10-CM | POA: Diagnosis not present

## 2019-07-16 DIAGNOSIS — N2581 Secondary hyperparathyroidism of renal origin: Secondary | ICD-10-CM | POA: Diagnosis not present

## 2019-07-18 DIAGNOSIS — F419 Anxiety disorder, unspecified: Secondary | ICD-10-CM | POA: Diagnosis not present

## 2019-07-18 DIAGNOSIS — Z992 Dependence on renal dialysis: Secondary | ICD-10-CM | POA: Diagnosis not present

## 2019-07-18 DIAGNOSIS — E1142 Type 2 diabetes mellitus with diabetic polyneuropathy: Secondary | ICD-10-CM | POA: Diagnosis not present

## 2019-07-18 DIAGNOSIS — Z9181 History of falling: Secondary | ICD-10-CM | POA: Diagnosis not present

## 2019-07-18 DIAGNOSIS — E039 Hypothyroidism, unspecified: Secondary | ICD-10-CM | POA: Diagnosis not present

## 2019-07-18 DIAGNOSIS — N186 End stage renal disease: Secondary | ICD-10-CM | POA: Diagnosis not present

## 2019-07-18 DIAGNOSIS — K219 Gastro-esophageal reflux disease without esophagitis: Secondary | ICD-10-CM | POA: Diagnosis not present

## 2019-07-18 DIAGNOSIS — E785 Hyperlipidemia, unspecified: Secondary | ICD-10-CM | POA: Diagnosis not present

## 2019-07-18 DIAGNOSIS — Z89611 Acquired absence of right leg above knee: Secondary | ICD-10-CM | POA: Diagnosis not present

## 2019-07-18 DIAGNOSIS — D631 Anemia in chronic kidney disease: Secondary | ICD-10-CM | POA: Diagnosis not present

## 2019-07-18 DIAGNOSIS — F329 Major depressive disorder, single episode, unspecified: Secondary | ICD-10-CM | POA: Diagnosis not present

## 2019-07-18 DIAGNOSIS — N2581 Secondary hyperparathyroidism of renal origin: Secondary | ICD-10-CM | POA: Diagnosis not present

## 2019-07-18 DIAGNOSIS — E1122 Type 2 diabetes mellitus with diabetic chronic kidney disease: Secondary | ICD-10-CM | POA: Diagnosis not present

## 2019-07-18 DIAGNOSIS — G43909 Migraine, unspecified, not intractable, without status migrainosus: Secondary | ICD-10-CM | POA: Diagnosis not present

## 2019-07-18 DIAGNOSIS — E1151 Type 2 diabetes mellitus with diabetic peripheral angiopathy without gangrene: Secondary | ICD-10-CM | POA: Diagnosis not present

## 2019-07-18 DIAGNOSIS — J449 Chronic obstructive pulmonary disease, unspecified: Secondary | ICD-10-CM | POA: Diagnosis not present

## 2019-07-18 DIAGNOSIS — M1990 Unspecified osteoarthritis, unspecified site: Secondary | ICD-10-CM | POA: Diagnosis not present

## 2019-07-18 DIAGNOSIS — F17210 Nicotine dependence, cigarettes, uncomplicated: Secondary | ICD-10-CM | POA: Diagnosis not present

## 2019-07-18 DIAGNOSIS — D509 Iron deficiency anemia, unspecified: Secondary | ICD-10-CM | POA: Diagnosis not present

## 2019-07-18 DIAGNOSIS — Z794 Long term (current) use of insulin: Secondary | ICD-10-CM | POA: Diagnosis not present

## 2019-07-18 DIAGNOSIS — Z4781 Encounter for orthopedic aftercare following surgical amputation: Secondary | ICD-10-CM | POA: Diagnosis not present

## 2019-07-18 DIAGNOSIS — I120 Hypertensive chronic kidney disease with stage 5 chronic kidney disease or end stage renal disease: Secondary | ICD-10-CM | POA: Diagnosis not present

## 2019-07-20 ENCOUNTER — Encounter: Payer: Self-pay | Admitting: *Deleted

## 2019-07-20 ENCOUNTER — Other Ambulatory Visit: Payer: Self-pay | Admitting: *Deleted

## 2019-07-20 NOTE — Patient Outreach (Addendum)
[place] [facility]) Care Management  [name]  07/20/2019   [name] 1951/05/18 [id number]   [place] Initial Assessment  Referral Date:  06/26/2019 Referral Source:  Transfer from [place] Reason for Referral:  Continued Disease Management Education Insurance:  Medicare   Outreach Attempt:  Successful telephone outreach to patient for introduction and initial telephone assessment.  HIPAA verified with patient.  RN Health Coach introduced self and role.  Patient verbally agrees to Disease Management outreaches.  Patient completed initial telephone assessment.  Social:  Patient lives at home with wife.  Wheelchair bound since recent above the knee amputation and reports about 4 falls in the past year.  Reports being independent with ADLs and IADLs even in the wheelchair.  Wife drives him to medical appointments.  DME in the home include:  Quad cane, rolling walker, standard walker, upper and lower dentures, portable pulse oximetry, wheelchair, crutches, blood pressure cuff, CBG meter, shower chair with back, bedside commode, scale, and grab bars in shower and around toilet.  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  Patient stating right surgical incision is completely healed.  States he does not have a prosthesis and is wheelchair bound but fairly independent.  Weighs at dialysis.  Denies any wounds to left leg/foot.  Denies any recent emergency room visits or hospitalizations since his surgery in September.  Monitors his blood sugars about twice a day.  Fasting blood sugar this morning was 96 with fasting  ranges of 90-150's.  Reports occasional hypoglycemia.  Last Hgb A1C was 6.3 on 06/23/2019  Medications: Patient reports taking about 9 medications daily.  Manages medications himself without difficulties.  Was having issues affording Toujeo and has worked with [place] for medication assistance.  Encounter Medications:  Outpatient Encounter Medications as of 07/20/2019  Medication Sig Note  . albuterol (VENTOLIN HFA) 108 (90 Base) MCG/ACT inhaler Inhale 1-2 puffs into the lungs every 6 (six) hours as needed for wheezing or shortness of breath.   . ARTIFICIAL TEAR OP Place 1 drop into both eyes every 6 (six) hours as needed (dry eyes).   Mar[place] Kitchen atorvastatin (LIPITOR) 80 MG tablet Take 1 tablet (80 mg total) by mouth at bedtime. (Patient taking differently: Take 80 mg by mouth daily. )   . bismuth subsalicylate (PEPTO BISMOL) 262 MG/15ML suspension Take 30 mLs by mouth every 6 (six) hours as needed for indigestion.   . calcium acetate (PHOSLO) 667 MG capsule Take 4 capsules (2,668 mg total) by mouth 3 (three) times daily with meals. (Patient taking differently: Take [phone] mg by mouth See admin instructions. Take 2668 mg with meals and 1334 with each snack)   . cinacalcet (SENSIPAR) 30 MG tablet Take 1 tablet (30 mg total) by mouth every evening.   . clopidogrel (PLAVIX) 75 MG tablet Take 1 tablet (75 mg total) by mouth daily.   . diphenhydrAMINE (BENADRYL) 25 MG tablet Take 25 mg by mouth daily as needed for itching.   Marland Kitchen ibuprofen (ADVIL) 200 MG tablet Take 800 mg by mouth 3 (three) times daily as needed for headache or moderate pain.   Marland Kitchen levothyroxine (SYNTHROID) 125 MCG tablet Take 1 tablet (125 mcg total)  by mouth daily before breakfast. (Patient taking differently: Take 125 mcg by mouth at bedtime. )   . lisinopril (ZESTRIL) 10 MG tablet Take 10 mg by mouth daily. 07/20/2019: Reports taking medication on nondialysis days [facility])  . Melatonin 10 MG CAPS Take 10 mg by  mouth at bedtime.   . polyethylene glycol (MIRALAX / GLYCOLAX) 17 g packet Take 17 g by mouth daily as needed for moderate constipation.   . pregabalin (LYRICA) 150 MG capsule Take 1 capsule (150 mg total) by mouth 3 (three) times daily.   [name] 300 UNIT/ML SOPN Inject 54-84 Units as directed at bedtime as needed (if blood sugar is 140 or higher).    . collagenase (SANTYL) ointment Apply 1 application topically daily as needed (wound care).   Marland Kitchen oxyCODONE (OXY IR/ROXICODONE) 5 MG immediate release tablet Take 1 tablet (5 mg total) by mouth every 6 (six) hours as needed for moderate pain. (Patient not taking: Reported on 07/20/2019)   . Sennosides 15 MG TABS Take 1 tablet by mouth as needed.    No facility-administered encounter medications on file as of 07/20/2019.     Functional Status:  In your present state of health, do you have any difficulty performing the following activities: 07/20/2019 05/06/2019  Hearing? N N  Vision? Tempie Donning  Comment needs to get reading glasses -  Difficulty concentrating or making decisions? N N  Walking or climbing stairs? Y Y  Comment history of above the knee amputation -  Dressing or bathing? N N  Doing errands, shopping? N N  Preparing Food and eating ? N N  Using the Toilet? N N  In the past six months, have you accidently leaked urine? N N  Do you have problems with loss of bowel control? N N  Managing your Medications? N N  Managing your Finances? N N  Housekeeping or managing your Housekeeping? N N  Some recent data might be hidden    Fall/Depression Screening: Fall Risk  07/20/2019 05/06/2019 02/25/2019  Falls in the past year? 1 1 0  Number falls in past yr: 1 1 -  Comment about 4 - -  Injury with Fall? 0 0 -  Risk for fall due to : History of fall(s);Impaired balance/gait;Impaired mobility;Medication side effect;Orthopedic patient History of fall(s);Impaired mobility -  Follow up Falls evaluation completed;Education provided;Falls  prevention discussed Falls prevention discussed -   PHQ 2/9 Scores 07/20/2019 05/06/2019  PHQ - 2 Score 1 0   [facility] CM Care Plan Problem One     Most Recent Value  Care Plan Problem One  Knowledge deficiet related to self care management of Diabetes.  Role Documenting the Problem One  [place] for Problem One  Active  [facility] Long Term Goal   Patient will maintain Hgb A1C of at or below 7 within the next 90 days  [facility] Long Term Goal Start Date  07/20/19  Interventions for Problem One Long Term Goal  Care plan and goals reviewed and discussed, sending Living Well with Diabetes Educational Packet, sending 2021 Calendar Booklet to assist with recording blood sugars, reviewed signs and symptoms of hypoglycemia, congratulated patient on current Hgb A1C, encouraged to continue to monitor blood sugars, discussed and encouraged diabetic renal diet, reviewed medications and indications and encouraged medication compliance, encouraged to keep and attend medical appontments, offered [place] Work referral for food resouces (patient declines at this time), confirmed patient is recieving Toujeo from medication assistance program  Appointments:  Patient attended appointment with primary care provider, Dr. Quillian Quince on 06/26/2019 and has follow up appointment scheduled for this month.  Advanced Directives:  Reports having Living Will and [place] in place and does not wish to make changes at this time.   Consent:  [facility] services reviewed and discussed.  Patient verbally agrees to Disease Management outreaches and continues to work with [place] for medication assistance.  Plan: RN Health Coach will send primary MD barriers letter. RN Health Coach will route initial telephone assessment note to primary MD. [place] will send patient [place]. RN Health Coach will send patient 2021 Calendar Booklet. RN Health Coach will send patient Living Well with Diabetes  Educational Packet. RN Health Coach will make next telephone outreach to patient with in the month of March and patient agreeable to outreach  [place] [phone] [email or web address]

## 2019-07-21 DIAGNOSIS — D509 Iron deficiency anemia, unspecified: Secondary | ICD-10-CM | POA: Diagnosis not present

## 2019-07-21 DIAGNOSIS — N2581 Secondary hyperparathyroidism of renal origin: Secondary | ICD-10-CM | POA: Diagnosis not present

## 2019-07-21 DIAGNOSIS — N186 End stage renal disease: Secondary | ICD-10-CM | POA: Diagnosis not present

## 2019-07-21 DIAGNOSIS — Z992 Dependence on renal dialysis: Secondary | ICD-10-CM | POA: Diagnosis not present

## 2019-07-21 DIAGNOSIS — D631 Anemia in chronic kidney disease: Secondary | ICD-10-CM | POA: Diagnosis not present

## 2019-07-23 DIAGNOSIS — D509 Iron deficiency anemia, unspecified: Secondary | ICD-10-CM | POA: Diagnosis not present

## 2019-07-23 DIAGNOSIS — N2581 Secondary hyperparathyroidism of renal origin: Secondary | ICD-10-CM | POA: Diagnosis not present

## 2019-07-23 DIAGNOSIS — Z992 Dependence on renal dialysis: Secondary | ICD-10-CM | POA: Diagnosis not present

## 2019-07-23 DIAGNOSIS — D631 Anemia in chronic kidney disease: Secondary | ICD-10-CM | POA: Diagnosis not present

## 2019-07-23 DIAGNOSIS — N186 End stage renal disease: Secondary | ICD-10-CM | POA: Diagnosis not present

## 2019-07-25 DIAGNOSIS — N2581 Secondary hyperparathyroidism of renal origin: Secondary | ICD-10-CM | POA: Diagnosis not present

## 2019-07-25 DIAGNOSIS — D509 Iron deficiency anemia, unspecified: Secondary | ICD-10-CM | POA: Diagnosis not present

## 2019-07-25 DIAGNOSIS — D631 Anemia in chronic kidney disease: Secondary | ICD-10-CM | POA: Diagnosis not present

## 2019-07-25 DIAGNOSIS — Z992 Dependence on renal dialysis: Secondary | ICD-10-CM | POA: Diagnosis not present

## 2019-07-25 DIAGNOSIS — N186 End stage renal disease: Secondary | ICD-10-CM | POA: Diagnosis not present

## 2019-07-27 DIAGNOSIS — E1151 Type 2 diabetes mellitus with diabetic peripheral angiopathy without gangrene: Secondary | ICD-10-CM | POA: Diagnosis not present

## 2019-07-27 DIAGNOSIS — E1142 Type 2 diabetes mellitus with diabetic polyneuropathy: Secondary | ICD-10-CM | POA: Diagnosis not present

## 2019-07-27 DIAGNOSIS — Z4781 Encounter for orthopedic aftercare following surgical amputation: Secondary | ICD-10-CM | POA: Diagnosis not present

## 2019-07-27 DIAGNOSIS — N186 End stage renal disease: Secondary | ICD-10-CM | POA: Diagnosis not present

## 2019-07-27 DIAGNOSIS — E1122 Type 2 diabetes mellitus with diabetic chronic kidney disease: Secondary | ICD-10-CM | POA: Diagnosis not present

## 2019-07-27 DIAGNOSIS — I120 Hypertensive chronic kidney disease with stage 5 chronic kidney disease or end stage renal disease: Secondary | ICD-10-CM | POA: Diagnosis not present

## 2019-07-28 DIAGNOSIS — D631 Anemia in chronic kidney disease: Secondary | ICD-10-CM | POA: Diagnosis not present

## 2019-07-28 DIAGNOSIS — D509 Iron deficiency anemia, unspecified: Secondary | ICD-10-CM | POA: Diagnosis not present

## 2019-07-28 DIAGNOSIS — N186 End stage renal disease: Secondary | ICD-10-CM | POA: Diagnosis not present

## 2019-07-28 DIAGNOSIS — Z992 Dependence on renal dialysis: Secondary | ICD-10-CM | POA: Diagnosis not present

## 2019-07-28 DIAGNOSIS — N2581 Secondary hyperparathyroidism of renal origin: Secondary | ICD-10-CM | POA: Diagnosis not present

## 2019-07-30 ENCOUNTER — Other Ambulatory Visit: Payer: Self-pay | Admitting: Pharmacy Technician

## 2019-07-30 DIAGNOSIS — N2581 Secondary hyperparathyroidism of renal origin: Secondary | ICD-10-CM | POA: Diagnosis not present

## 2019-07-30 DIAGNOSIS — D509 Iron deficiency anemia, unspecified: Secondary | ICD-10-CM | POA: Diagnosis not present

## 2019-07-30 DIAGNOSIS — D631 Anemia in chronic kidney disease: Secondary | ICD-10-CM | POA: Diagnosis not present

## 2019-07-30 DIAGNOSIS — N186 End stage renal disease: Secondary | ICD-10-CM | POA: Diagnosis not present

## 2019-07-30 DIAGNOSIS — Z992 Dependence on renal dialysis: Secondary | ICD-10-CM | POA: Diagnosis not present

## 2019-07-30 NOTE — Patient Outreach (Addendum)
[facility]) Care Management  07/30/2019  [name] [birth year] [id number]    Successful call placed to patient regarding patient assistance medication delivery of Toujeo, HIPAA identifiers verified. [name] confirms he received a 90 days supply of Toujeo from [employer]. Informed patient that he has been approved for program until 06/16/2020!! Also informed patient that to obtain refills he will need to contact his provider and they will have to request the refill from [facility] patient assistance. Requested he contact me if any issues occur with refills.   Follow up:  Will route note to [place] for case closure  Maud Deed. Chana Bode [place] Certified Pharmacy Technician [place] Management Direct Dial:[phone]

## 2019-07-31 ENCOUNTER — Other Ambulatory Visit: Payer: Self-pay | Admitting: Pharmacist

## 2019-07-31 NOTE — Patient Outreach (Signed)
[facility]) Care Management  [place]   07/31/2019  [name] [birth year] [id number]   Patient has been approved for [facility] patient assistance program through [employer].  He has received medication and has no other medication questions or concerns at this time.  He has been approved through 06/16/2020 and has been instructed on refills / renewal process.    Plan: -Will close [facility] pharmacy case as goals of care have been met.  -I have provided my contact information if patient or family needs to reach out to me in the future.  -Thank you for allowing [facility] pharmacy to be involved in this patient's care.    Ralene Bathe, PharmD, [place] [phone]

## 2019-08-01 DIAGNOSIS — D509 Iron deficiency anemia, unspecified: Secondary | ICD-10-CM | POA: Diagnosis not present

## 2019-08-01 DIAGNOSIS — N186 End stage renal disease: Secondary | ICD-10-CM | POA: Diagnosis not present

## 2019-08-01 DIAGNOSIS — Z992 Dependence on renal dialysis: Secondary | ICD-10-CM | POA: Diagnosis not present

## 2019-08-01 DIAGNOSIS — D631 Anemia in chronic kidney disease: Secondary | ICD-10-CM | POA: Diagnosis not present

## 2019-08-01 DIAGNOSIS — N2581 Secondary hyperparathyroidism of renal origin: Secondary | ICD-10-CM | POA: Diagnosis not present

## 2019-08-04 DIAGNOSIS — D509 Iron deficiency anemia, unspecified: Secondary | ICD-10-CM | POA: Diagnosis not present

## 2019-08-04 DIAGNOSIS — Z992 Dependence on renal dialysis: Secondary | ICD-10-CM | POA: Diagnosis not present

## 2019-08-04 DIAGNOSIS — D631 Anemia in chronic kidney disease: Secondary | ICD-10-CM | POA: Diagnosis not present

## 2019-08-04 DIAGNOSIS — N186 End stage renal disease: Secondary | ICD-10-CM | POA: Diagnosis not present

## 2019-08-04 DIAGNOSIS — N2581 Secondary hyperparathyroidism of renal origin: Secondary | ICD-10-CM | POA: Diagnosis not present

## 2019-08-06 DIAGNOSIS — D509 Iron deficiency anemia, unspecified: Secondary | ICD-10-CM | POA: Diagnosis not present

## 2019-08-06 DIAGNOSIS — N186 End stage renal disease: Secondary | ICD-10-CM | POA: Diagnosis not present

## 2019-08-06 DIAGNOSIS — N2581 Secondary hyperparathyroidism of renal origin: Secondary | ICD-10-CM | POA: Diagnosis not present

## 2019-08-06 DIAGNOSIS — Z992 Dependence on renal dialysis: Secondary | ICD-10-CM | POA: Diagnosis not present

## 2019-08-06 DIAGNOSIS — D631 Anemia in chronic kidney disease: Secondary | ICD-10-CM | POA: Diagnosis not present

## 2019-08-08 DIAGNOSIS — D631 Anemia in chronic kidney disease: Secondary | ICD-10-CM | POA: Diagnosis not present

## 2019-08-08 DIAGNOSIS — D509 Iron deficiency anemia, unspecified: Secondary | ICD-10-CM | POA: Diagnosis not present

## 2019-08-08 DIAGNOSIS — N2581 Secondary hyperparathyroidism of renal origin: Secondary | ICD-10-CM | POA: Diagnosis not present

## 2019-08-08 DIAGNOSIS — N186 End stage renal disease: Secondary | ICD-10-CM | POA: Diagnosis not present

## 2019-08-08 DIAGNOSIS — Z992 Dependence on renal dialysis: Secondary | ICD-10-CM | POA: Diagnosis not present

## 2019-08-09 DIAGNOSIS — Z992 Dependence on renal dialysis: Secondary | ICD-10-CM | POA: Diagnosis not present

## 2019-08-09 DIAGNOSIS — D509 Iron deficiency anemia, unspecified: Secondary | ICD-10-CM | POA: Diagnosis not present

## 2019-08-09 DIAGNOSIS — N2581 Secondary hyperparathyroidism of renal origin: Secondary | ICD-10-CM | POA: Diagnosis not present

## 2019-08-09 DIAGNOSIS — N186 End stage renal disease: Secondary | ICD-10-CM | POA: Diagnosis not present

## 2019-08-09 DIAGNOSIS — D631 Anemia in chronic kidney disease: Secondary | ICD-10-CM | POA: Diagnosis not present

## 2019-08-11 DIAGNOSIS — D509 Iron deficiency anemia, unspecified: Secondary | ICD-10-CM | POA: Diagnosis not present

## 2019-08-11 DIAGNOSIS — N186 End stage renal disease: Secondary | ICD-10-CM | POA: Diagnosis not present

## 2019-08-11 DIAGNOSIS — D631 Anemia in chronic kidney disease: Secondary | ICD-10-CM | POA: Diagnosis not present

## 2019-08-11 DIAGNOSIS — Z992 Dependence on renal dialysis: Secondary | ICD-10-CM | POA: Diagnosis not present

## 2019-08-11 DIAGNOSIS — N2581 Secondary hyperparathyroidism of renal origin: Secondary | ICD-10-CM | POA: Diagnosis not present

## 2019-08-13 DIAGNOSIS — N186 End stage renal disease: Secondary | ICD-10-CM | POA: Diagnosis not present

## 2019-08-13 DIAGNOSIS — D509 Iron deficiency anemia, unspecified: Secondary | ICD-10-CM | POA: Diagnosis not present

## 2019-08-13 DIAGNOSIS — N2581 Secondary hyperparathyroidism of renal origin: Secondary | ICD-10-CM | POA: Diagnosis not present

## 2019-08-13 DIAGNOSIS — D631 Anemia in chronic kidney disease: Secondary | ICD-10-CM | POA: Diagnosis not present

## 2019-08-13 DIAGNOSIS — Z992 Dependence on renal dialysis: Secondary | ICD-10-CM | POA: Diagnosis not present

## 2019-08-14 DIAGNOSIS — N2581 Secondary hyperparathyroidism of renal origin: Secondary | ICD-10-CM | POA: Diagnosis not present

## 2019-08-14 DIAGNOSIS — E1142 Type 2 diabetes mellitus with diabetic polyneuropathy: Secondary | ICD-10-CM | POA: Diagnosis not present

## 2019-08-14 DIAGNOSIS — E1122 Type 2 diabetes mellitus with diabetic chronic kidney disease: Secondary | ICD-10-CM | POA: Diagnosis not present

## 2019-08-14 DIAGNOSIS — N186 End stage renal disease: Secondary | ICD-10-CM | POA: Diagnosis not present

## 2019-08-14 DIAGNOSIS — Z4781 Encounter for orthopedic aftercare following surgical amputation: Secondary | ICD-10-CM | POA: Diagnosis not present

## 2019-08-14 DIAGNOSIS — Z992 Dependence on renal dialysis: Secondary | ICD-10-CM | POA: Diagnosis not present

## 2019-08-14 DIAGNOSIS — E1151 Type 2 diabetes mellitus with diabetic peripheral angiopathy without gangrene: Secondary | ICD-10-CM | POA: Diagnosis not present

## 2019-08-14 DIAGNOSIS — I120 Hypertensive chronic kidney disease with stage 5 chronic kidney disease or end stage renal disease: Secondary | ICD-10-CM | POA: Diagnosis not present

## 2019-08-14 DIAGNOSIS — D631 Anemia in chronic kidney disease: Secondary | ICD-10-CM | POA: Diagnosis not present

## 2019-08-14 DIAGNOSIS — D509 Iron deficiency anemia, unspecified: Secondary | ICD-10-CM | POA: Diagnosis not present

## 2019-08-15 DIAGNOSIS — N2581 Secondary hyperparathyroidism of renal origin: Secondary | ICD-10-CM | POA: Diagnosis not present

## 2019-08-15 DIAGNOSIS — Z992 Dependence on renal dialysis: Secondary | ICD-10-CM | POA: Diagnosis not present

## 2019-08-15 DIAGNOSIS — D509 Iron deficiency anemia, unspecified: Secondary | ICD-10-CM | POA: Diagnosis not present

## 2019-08-15 DIAGNOSIS — N186 End stage renal disease: Secondary | ICD-10-CM | POA: Diagnosis not present

## 2019-08-15 DIAGNOSIS — D631 Anemia in chronic kidney disease: Secondary | ICD-10-CM | POA: Diagnosis not present

## 2019-08-17 ENCOUNTER — Telehealth: Payer: Self-pay

## 2019-08-17 DIAGNOSIS — E039 Hypothyroidism, unspecified: Secondary | ICD-10-CM | POA: Diagnosis not present

## 2019-08-17 DIAGNOSIS — I120 Hypertensive chronic kidney disease with stage 5 chronic kidney disease or end stage renal disease: Secondary | ICD-10-CM | POA: Diagnosis not present

## 2019-08-17 DIAGNOSIS — F329 Major depressive disorder, single episode, unspecified: Secondary | ICD-10-CM | POA: Diagnosis not present

## 2019-08-17 DIAGNOSIS — Z89611 Acquired absence of right leg above knee: Secondary | ICD-10-CM | POA: Diagnosis not present

## 2019-08-17 DIAGNOSIS — G43909 Migraine, unspecified, not intractable, without status migrainosus: Secondary | ICD-10-CM | POA: Diagnosis not present

## 2019-08-17 DIAGNOSIS — F419 Anxiety disorder, unspecified: Secondary | ICD-10-CM | POA: Diagnosis not present

## 2019-08-17 DIAGNOSIS — Z794 Long term (current) use of insulin: Secondary | ICD-10-CM | POA: Diagnosis not present

## 2019-08-17 DIAGNOSIS — E1122 Type 2 diabetes mellitus with diabetic chronic kidney disease: Secondary | ICD-10-CM | POA: Diagnosis not present

## 2019-08-17 DIAGNOSIS — F17210 Nicotine dependence, cigarettes, uncomplicated: Secondary | ICD-10-CM | POA: Diagnosis not present

## 2019-08-17 DIAGNOSIS — M1990 Unspecified osteoarthritis, unspecified site: Secondary | ICD-10-CM | POA: Diagnosis not present

## 2019-08-17 DIAGNOSIS — J449 Chronic obstructive pulmonary disease, unspecified: Secondary | ICD-10-CM | POA: Diagnosis not present

## 2019-08-17 DIAGNOSIS — E785 Hyperlipidemia, unspecified: Secondary | ICD-10-CM | POA: Diagnosis not present

## 2019-08-17 DIAGNOSIS — N186 End stage renal disease: Secondary | ICD-10-CM | POA: Diagnosis not present

## 2019-08-17 DIAGNOSIS — Z4781 Encounter for orthopedic aftercare following surgical amputation: Secondary | ICD-10-CM | POA: Diagnosis not present

## 2019-08-17 DIAGNOSIS — E1151 Type 2 diabetes mellitus with diabetic peripheral angiopathy without gangrene: Secondary | ICD-10-CM | POA: Diagnosis not present

## 2019-08-17 DIAGNOSIS — E1142 Type 2 diabetes mellitus with diabetic polyneuropathy: Secondary | ICD-10-CM | POA: Diagnosis not present

## 2019-08-17 DIAGNOSIS — K219 Gastro-esophageal reflux disease without esophagitis: Secondary | ICD-10-CM | POA: Diagnosis not present

## 2019-08-17 DIAGNOSIS — Z9181 History of falling: Secondary | ICD-10-CM | POA: Diagnosis not present

## 2019-08-17 DIAGNOSIS — Z992 Dependence on renal dialysis: Secondary | ICD-10-CM | POA: Diagnosis not present

## 2019-08-17 NOTE — Telephone Encounter (Signed)
Melissa, RN with [facility] called.  They will go back out to see the patient since he has developed a dime sized open place in his foot.  Patient has a follow up appt on 01/28.  She will contact the office if he needs further attention.  Thurston Hole., LPN

## 2019-08-18 DIAGNOSIS — N2581 Secondary hyperparathyroidism of renal origin: Secondary | ICD-10-CM | POA: Diagnosis not present

## 2019-08-18 DIAGNOSIS — D509 Iron deficiency anemia, unspecified: Secondary | ICD-10-CM | POA: Diagnosis not present

## 2019-08-18 DIAGNOSIS — Z992 Dependence on renal dialysis: Secondary | ICD-10-CM | POA: Diagnosis not present

## 2019-08-18 DIAGNOSIS — D631 Anemia in chronic kidney disease: Secondary | ICD-10-CM | POA: Diagnosis not present

## 2019-08-18 DIAGNOSIS — N186 End stage renal disease: Secondary | ICD-10-CM | POA: Diagnosis not present

## 2019-08-19 DIAGNOSIS — E1151 Type 2 diabetes mellitus with diabetic peripheral angiopathy without gangrene: Secondary | ICD-10-CM | POA: Diagnosis not present

## 2019-08-19 DIAGNOSIS — E1142 Type 2 diabetes mellitus with diabetic polyneuropathy: Secondary | ICD-10-CM | POA: Diagnosis not present

## 2019-08-19 DIAGNOSIS — I120 Hypertensive chronic kidney disease with stage 5 chronic kidney disease or end stage renal disease: Secondary | ICD-10-CM | POA: Diagnosis not present

## 2019-08-19 DIAGNOSIS — E1122 Type 2 diabetes mellitus with diabetic chronic kidney disease: Secondary | ICD-10-CM | POA: Diagnosis not present

## 2019-08-19 DIAGNOSIS — Z4781 Encounter for orthopedic aftercare following surgical amputation: Secondary | ICD-10-CM | POA: Diagnosis not present

## 2019-08-19 DIAGNOSIS — N186 End stage renal disease: Secondary | ICD-10-CM | POA: Diagnosis not present

## 2019-08-20 DIAGNOSIS — D509 Iron deficiency anemia, unspecified: Secondary | ICD-10-CM | POA: Diagnosis not present

## 2019-08-20 DIAGNOSIS — N186 End stage renal disease: Secondary | ICD-10-CM | POA: Diagnosis not present

## 2019-08-20 DIAGNOSIS — D631 Anemia in chronic kidney disease: Secondary | ICD-10-CM | POA: Diagnosis not present

## 2019-08-20 DIAGNOSIS — Z992 Dependence on renal dialysis: Secondary | ICD-10-CM | POA: Diagnosis not present

## 2019-08-20 DIAGNOSIS — N2581 Secondary hyperparathyroidism of renal origin: Secondary | ICD-10-CM | POA: Diagnosis not present

## 2019-08-21 DIAGNOSIS — E1122 Type 2 diabetes mellitus with diabetic chronic kidney disease: Secondary | ICD-10-CM | POA: Diagnosis not present

## 2019-08-21 DIAGNOSIS — N186 End stage renal disease: Secondary | ICD-10-CM | POA: Diagnosis not present

## 2019-08-21 DIAGNOSIS — E1151 Type 2 diabetes mellitus with diabetic peripheral angiopathy without gangrene: Secondary | ICD-10-CM | POA: Diagnosis not present

## 2019-08-21 DIAGNOSIS — Z4781 Encounter for orthopedic aftercare following surgical amputation: Secondary | ICD-10-CM | POA: Diagnosis not present

## 2019-08-21 DIAGNOSIS — E1142 Type 2 diabetes mellitus with diabetic polyneuropathy: Secondary | ICD-10-CM | POA: Diagnosis not present

## 2019-08-21 DIAGNOSIS — I120 Hypertensive chronic kidney disease with stage 5 chronic kidney disease or end stage renal disease: Secondary | ICD-10-CM | POA: Diagnosis not present

## 2019-08-22 DIAGNOSIS — N186 End stage renal disease: Secondary | ICD-10-CM | POA: Diagnosis not present

## 2019-08-22 DIAGNOSIS — D631 Anemia in chronic kidney disease: Secondary | ICD-10-CM | POA: Diagnosis not present

## 2019-08-22 DIAGNOSIS — N2581 Secondary hyperparathyroidism of renal origin: Secondary | ICD-10-CM | POA: Diagnosis not present

## 2019-08-22 DIAGNOSIS — D509 Iron deficiency anemia, unspecified: Secondary | ICD-10-CM | POA: Diagnosis not present

## 2019-08-22 DIAGNOSIS — Z992 Dependence on renal dialysis: Secondary | ICD-10-CM | POA: Diagnosis not present

## 2019-08-24 DIAGNOSIS — E1122 Type 2 diabetes mellitus with diabetic chronic kidney disease: Secondary | ICD-10-CM | POA: Diagnosis not present

## 2019-08-24 DIAGNOSIS — Z4781 Encounter for orthopedic aftercare following surgical amputation: Secondary | ICD-10-CM | POA: Diagnosis not present

## 2019-08-24 DIAGNOSIS — N186 End stage renal disease: Secondary | ICD-10-CM | POA: Diagnosis not present

## 2019-08-24 DIAGNOSIS — E1151 Type 2 diabetes mellitus with diabetic peripheral angiopathy without gangrene: Secondary | ICD-10-CM | POA: Diagnosis not present

## 2019-08-24 DIAGNOSIS — I120 Hypertensive chronic kidney disease with stage 5 chronic kidney disease or end stage renal disease: Secondary | ICD-10-CM | POA: Diagnosis not present

## 2019-08-24 DIAGNOSIS — E1142 Type 2 diabetes mellitus with diabetic polyneuropathy: Secondary | ICD-10-CM | POA: Diagnosis not present

## 2019-08-25 DIAGNOSIS — Z992 Dependence on renal dialysis: Secondary | ICD-10-CM | POA: Diagnosis not present

## 2019-08-25 DIAGNOSIS — N2581 Secondary hyperparathyroidism of renal origin: Secondary | ICD-10-CM | POA: Diagnosis not present

## 2019-08-25 DIAGNOSIS — D631 Anemia in chronic kidney disease: Secondary | ICD-10-CM | POA: Diagnosis not present

## 2019-08-25 DIAGNOSIS — D509 Iron deficiency anemia, unspecified: Secondary | ICD-10-CM | POA: Diagnosis not present

## 2019-08-25 DIAGNOSIS — N186 End stage renal disease: Secondary | ICD-10-CM | POA: Diagnosis not present

## 2019-08-27 DIAGNOSIS — D631 Anemia in chronic kidney disease: Secondary | ICD-10-CM | POA: Diagnosis not present

## 2019-08-27 DIAGNOSIS — N2581 Secondary hyperparathyroidism of renal origin: Secondary | ICD-10-CM | POA: Diagnosis not present

## 2019-08-27 DIAGNOSIS — D509 Iron deficiency anemia, unspecified: Secondary | ICD-10-CM | POA: Diagnosis not present

## 2019-08-27 DIAGNOSIS — Z992 Dependence on renal dialysis: Secondary | ICD-10-CM | POA: Diagnosis not present

## 2019-08-27 DIAGNOSIS — N186 End stage renal disease: Secondary | ICD-10-CM | POA: Diagnosis not present

## 2019-08-28 DIAGNOSIS — Z4781 Encounter for orthopedic aftercare following surgical amputation: Secondary | ICD-10-CM | POA: Diagnosis not present

## 2019-08-28 DIAGNOSIS — E1122 Type 2 diabetes mellitus with diabetic chronic kidney disease: Secondary | ICD-10-CM | POA: Diagnosis not present

## 2019-08-28 DIAGNOSIS — I120 Hypertensive chronic kidney disease with stage 5 chronic kidney disease or end stage renal disease: Secondary | ICD-10-CM | POA: Diagnosis not present

## 2019-08-28 DIAGNOSIS — N186 End stage renal disease: Secondary | ICD-10-CM | POA: Diagnosis not present

## 2019-08-28 DIAGNOSIS — E1151 Type 2 diabetes mellitus with diabetic peripheral angiopathy without gangrene: Secondary | ICD-10-CM | POA: Diagnosis not present

## 2019-08-28 DIAGNOSIS — E1142 Type 2 diabetes mellitus with diabetic polyneuropathy: Secondary | ICD-10-CM | POA: Diagnosis not present

## 2019-08-29 DIAGNOSIS — N186 End stage renal disease: Secondary | ICD-10-CM | POA: Diagnosis not present

## 2019-08-29 DIAGNOSIS — Z992 Dependence on renal dialysis: Secondary | ICD-10-CM | POA: Diagnosis not present

## 2019-08-29 DIAGNOSIS — D631 Anemia in chronic kidney disease: Secondary | ICD-10-CM | POA: Diagnosis not present

## 2019-08-29 DIAGNOSIS — N2581 Secondary hyperparathyroidism of renal origin: Secondary | ICD-10-CM | POA: Diagnosis not present

## 2019-08-29 DIAGNOSIS — D509 Iron deficiency anemia, unspecified: Secondary | ICD-10-CM | POA: Diagnosis not present

## 2019-08-31 DIAGNOSIS — I739 Peripheral vascular disease, unspecified: Secondary | ICD-10-CM | POA: Diagnosis not present

## 2019-08-31 DIAGNOSIS — I10 Essential (primary) hypertension: Secondary | ICD-10-CM | POA: Diagnosis not present

## 2019-08-31 DIAGNOSIS — J449 Chronic obstructive pulmonary disease, unspecified: Secondary | ICD-10-CM | POA: Diagnosis not present

## 2019-08-31 DIAGNOSIS — Z992 Dependence on renal dialysis: Secondary | ICD-10-CM | POA: Diagnosis not present

## 2019-08-31 DIAGNOSIS — Z0001 Encounter for general adult medical examination with abnormal findings: Secondary | ICD-10-CM | POA: Diagnosis not present

## 2019-08-31 DIAGNOSIS — Z89619 Acquired absence of unspecified leg above knee: Secondary | ICD-10-CM | POA: Insufficient documentation

## 2019-08-31 DIAGNOSIS — I120 Hypertensive chronic kidney disease with stage 5 chronic kidney disease or end stage renal disease: Secondary | ICD-10-CM | POA: Diagnosis not present

## 2019-08-31 DIAGNOSIS — E1122 Type 2 diabetes mellitus with diabetic chronic kidney disease: Secondary | ICD-10-CM | POA: Diagnosis not present

## 2019-08-31 DIAGNOSIS — N186 End stage renal disease: Secondary | ICD-10-CM | POA: Diagnosis not present

## 2019-08-31 DIAGNOSIS — Z4781 Encounter for orthopedic aftercare following surgical amputation: Secondary | ICD-10-CM | POA: Diagnosis not present

## 2019-08-31 DIAGNOSIS — E1142 Type 2 diabetes mellitus with diabetic polyneuropathy: Secondary | ICD-10-CM | POA: Diagnosis not present

## 2019-08-31 DIAGNOSIS — E1151 Type 2 diabetes mellitus with diabetic peripheral angiopathy without gangrene: Secondary | ICD-10-CM | POA: Diagnosis not present

## 2019-09-01 DIAGNOSIS — N2581 Secondary hyperparathyroidism of renal origin: Secondary | ICD-10-CM | POA: Diagnosis not present

## 2019-09-01 DIAGNOSIS — D631 Anemia in chronic kidney disease: Secondary | ICD-10-CM | POA: Diagnosis not present

## 2019-09-01 DIAGNOSIS — Z992 Dependence on renal dialysis: Secondary | ICD-10-CM | POA: Diagnosis not present

## 2019-09-01 DIAGNOSIS — N186 End stage renal disease: Secondary | ICD-10-CM | POA: Diagnosis not present

## 2019-09-01 DIAGNOSIS — D509 Iron deficiency anemia, unspecified: Secondary | ICD-10-CM | POA: Diagnosis not present

## 2019-09-02 DIAGNOSIS — E1151 Type 2 diabetes mellitus with diabetic peripheral angiopathy without gangrene: Secondary | ICD-10-CM | POA: Diagnosis not present

## 2019-09-02 DIAGNOSIS — Z4781 Encounter for orthopedic aftercare following surgical amputation: Secondary | ICD-10-CM | POA: Diagnosis not present

## 2019-09-02 DIAGNOSIS — E1142 Type 2 diabetes mellitus with diabetic polyneuropathy: Secondary | ICD-10-CM | POA: Diagnosis not present

## 2019-09-02 DIAGNOSIS — E1122 Type 2 diabetes mellitus with diabetic chronic kidney disease: Secondary | ICD-10-CM | POA: Diagnosis not present

## 2019-09-02 DIAGNOSIS — I120 Hypertensive chronic kidney disease with stage 5 chronic kidney disease or end stage renal disease: Secondary | ICD-10-CM | POA: Diagnosis not present

## 2019-09-02 DIAGNOSIS — N186 End stage renal disease: Secondary | ICD-10-CM | POA: Diagnosis not present

## 2019-09-03 DIAGNOSIS — N186 End stage renal disease: Secondary | ICD-10-CM | POA: Diagnosis not present

## 2019-09-03 DIAGNOSIS — Z992 Dependence on renal dialysis: Secondary | ICD-10-CM | POA: Diagnosis not present

## 2019-09-03 DIAGNOSIS — N2581 Secondary hyperparathyroidism of renal origin: Secondary | ICD-10-CM | POA: Diagnosis not present

## 2019-09-03 DIAGNOSIS — D509 Iron deficiency anemia, unspecified: Secondary | ICD-10-CM | POA: Diagnosis not present

## 2019-09-03 DIAGNOSIS — D631 Anemia in chronic kidney disease: Secondary | ICD-10-CM | POA: Diagnosis not present

## 2019-09-04 DIAGNOSIS — I120 Hypertensive chronic kidney disease with stage 5 chronic kidney disease or end stage renal disease: Secondary | ICD-10-CM | POA: Diagnosis not present

## 2019-09-04 DIAGNOSIS — E1142 Type 2 diabetes mellitus with diabetic polyneuropathy: Secondary | ICD-10-CM | POA: Diagnosis not present

## 2019-09-04 DIAGNOSIS — N186 End stage renal disease: Secondary | ICD-10-CM | POA: Diagnosis not present

## 2019-09-04 DIAGNOSIS — Z4781 Encounter for orthopedic aftercare following surgical amputation: Secondary | ICD-10-CM | POA: Diagnosis not present

## 2019-09-04 DIAGNOSIS — E1122 Type 2 diabetes mellitus with diabetic chronic kidney disease: Secondary | ICD-10-CM | POA: Diagnosis not present

## 2019-09-04 DIAGNOSIS — E1151 Type 2 diabetes mellitus with diabetic peripheral angiopathy without gangrene: Secondary | ICD-10-CM | POA: Diagnosis not present

## 2019-09-05 DIAGNOSIS — D631 Anemia in chronic kidney disease: Secondary | ICD-10-CM | POA: Diagnosis not present

## 2019-09-05 DIAGNOSIS — D509 Iron deficiency anemia, unspecified: Secondary | ICD-10-CM | POA: Diagnosis not present

## 2019-09-05 DIAGNOSIS — Z992 Dependence on renal dialysis: Secondary | ICD-10-CM | POA: Diagnosis not present

## 2019-09-05 DIAGNOSIS — N186 End stage renal disease: Secondary | ICD-10-CM | POA: Diagnosis not present

## 2019-09-05 DIAGNOSIS — N2581 Secondary hyperparathyroidism of renal origin: Secondary | ICD-10-CM | POA: Diagnosis not present

## 2019-09-07 DIAGNOSIS — N2581 Secondary hyperparathyroidism of renal origin: Secondary | ICD-10-CM | POA: Diagnosis not present

## 2019-09-07 DIAGNOSIS — I120 Hypertensive chronic kidney disease with stage 5 chronic kidney disease or end stage renal disease: Secondary | ICD-10-CM | POA: Diagnosis not present

## 2019-09-07 DIAGNOSIS — D631 Anemia in chronic kidney disease: Secondary | ICD-10-CM | POA: Diagnosis not present

## 2019-09-07 DIAGNOSIS — N186 End stage renal disease: Secondary | ICD-10-CM | POA: Diagnosis not present

## 2019-09-07 DIAGNOSIS — Z992 Dependence on renal dialysis: Secondary | ICD-10-CM | POA: Diagnosis not present

## 2019-09-07 DIAGNOSIS — D509 Iron deficiency anemia, unspecified: Secondary | ICD-10-CM | POA: Diagnosis not present

## 2019-09-07 DIAGNOSIS — Z4781 Encounter for orthopedic aftercare following surgical amputation: Secondary | ICD-10-CM | POA: Diagnosis not present

## 2019-09-07 DIAGNOSIS — E1142 Type 2 diabetes mellitus with diabetic polyneuropathy: Secondary | ICD-10-CM | POA: Diagnosis not present

## 2019-09-07 DIAGNOSIS — E1122 Type 2 diabetes mellitus with diabetic chronic kidney disease: Secondary | ICD-10-CM | POA: Diagnosis not present

## 2019-09-07 DIAGNOSIS — E1151 Type 2 diabetes mellitus with diabetic peripheral angiopathy without gangrene: Secondary | ICD-10-CM | POA: Diagnosis not present

## 2019-09-08 DIAGNOSIS — N2581 Secondary hyperparathyroidism of renal origin: Secondary | ICD-10-CM | POA: Diagnosis not present

## 2019-09-08 DIAGNOSIS — N186 End stage renal disease: Secondary | ICD-10-CM | POA: Diagnosis not present

## 2019-09-08 DIAGNOSIS — D631 Anemia in chronic kidney disease: Secondary | ICD-10-CM | POA: Diagnosis not present

## 2019-09-08 DIAGNOSIS — D509 Iron deficiency anemia, unspecified: Secondary | ICD-10-CM | POA: Diagnosis not present

## 2019-09-08 DIAGNOSIS — Z992 Dependence on renal dialysis: Secondary | ICD-10-CM | POA: Diagnosis not present

## 2019-09-09 ENCOUNTER — Other Ambulatory Visit: Payer: Self-pay

## 2019-09-09 DIAGNOSIS — I739 Peripheral vascular disease, unspecified: Secondary | ICD-10-CM

## 2019-09-10 ENCOUNTER — Inpatient Hospital Stay (HOSPITAL_COMMUNITY): Admission: RE | Admit: 2019-09-10 | Payer: Medicare Other | Source: Ambulatory Visit

## 2019-09-10 ENCOUNTER — Ambulatory Visit: Payer: Medicare Other | Admitting: Vascular Surgery

## 2019-09-10 DIAGNOSIS — D631 Anemia in chronic kidney disease: Secondary | ICD-10-CM | POA: Diagnosis not present

## 2019-09-10 DIAGNOSIS — E1142 Type 2 diabetes mellitus with diabetic polyneuropathy: Secondary | ICD-10-CM | POA: Diagnosis not present

## 2019-09-10 DIAGNOSIS — D509 Iron deficiency anemia, unspecified: Secondary | ICD-10-CM | POA: Diagnosis not present

## 2019-09-10 DIAGNOSIS — N2581 Secondary hyperparathyroidism of renal origin: Secondary | ICD-10-CM | POA: Diagnosis not present

## 2019-09-10 DIAGNOSIS — Z992 Dependence on renal dialysis: Secondary | ICD-10-CM | POA: Diagnosis not present

## 2019-09-10 DIAGNOSIS — E1122 Type 2 diabetes mellitus with diabetic chronic kidney disease: Secondary | ICD-10-CM | POA: Diagnosis not present

## 2019-09-10 DIAGNOSIS — E1151 Type 2 diabetes mellitus with diabetic peripheral angiopathy without gangrene: Secondary | ICD-10-CM | POA: Diagnosis not present

## 2019-09-10 DIAGNOSIS — I120 Hypertensive chronic kidney disease with stage 5 chronic kidney disease or end stage renal disease: Secondary | ICD-10-CM | POA: Diagnosis not present

## 2019-09-10 DIAGNOSIS — N186 End stage renal disease: Secondary | ICD-10-CM | POA: Diagnosis not present

## 2019-09-10 DIAGNOSIS — Z4781 Encounter for orthopedic aftercare following surgical amputation: Secondary | ICD-10-CM | POA: Diagnosis not present

## 2019-09-11 DIAGNOSIS — E1142 Type 2 diabetes mellitus with diabetic polyneuropathy: Secondary | ICD-10-CM | POA: Diagnosis not present

## 2019-09-11 DIAGNOSIS — E1122 Type 2 diabetes mellitus with diabetic chronic kidney disease: Secondary | ICD-10-CM | POA: Diagnosis not present

## 2019-09-11 DIAGNOSIS — E1151 Type 2 diabetes mellitus with diabetic peripheral angiopathy without gangrene: Secondary | ICD-10-CM | POA: Diagnosis not present

## 2019-09-11 DIAGNOSIS — N186 End stage renal disease: Secondary | ICD-10-CM | POA: Diagnosis not present

## 2019-09-11 DIAGNOSIS — Z4781 Encounter for orthopedic aftercare following surgical amputation: Secondary | ICD-10-CM | POA: Diagnosis not present

## 2019-09-11 DIAGNOSIS — I120 Hypertensive chronic kidney disease with stage 5 chronic kidney disease or end stage renal disease: Secondary | ICD-10-CM | POA: Diagnosis not present

## 2019-09-12 DIAGNOSIS — N2581 Secondary hyperparathyroidism of renal origin: Secondary | ICD-10-CM | POA: Diagnosis not present

## 2019-09-12 DIAGNOSIS — D509 Iron deficiency anemia, unspecified: Secondary | ICD-10-CM | POA: Diagnosis not present

## 2019-09-12 DIAGNOSIS — D631 Anemia in chronic kidney disease: Secondary | ICD-10-CM | POA: Diagnosis not present

## 2019-09-12 DIAGNOSIS — Z992 Dependence on renal dialysis: Secondary | ICD-10-CM | POA: Diagnosis not present

## 2019-09-12 DIAGNOSIS — N186 End stage renal disease: Secondary | ICD-10-CM | POA: Diagnosis not present

## 2019-09-13 DIAGNOSIS — N186 End stage renal disease: Secondary | ICD-10-CM | POA: Diagnosis not present

## 2019-09-13 DIAGNOSIS — Z992 Dependence on renal dialysis: Secondary | ICD-10-CM | POA: Diagnosis not present

## 2019-09-14 DIAGNOSIS — D631 Anemia in chronic kidney disease: Secondary | ICD-10-CM | POA: Diagnosis not present

## 2019-09-14 DIAGNOSIS — N2581 Secondary hyperparathyroidism of renal origin: Secondary | ICD-10-CM | POA: Diagnosis not present

## 2019-09-14 DIAGNOSIS — D509 Iron deficiency anemia, unspecified: Secondary | ICD-10-CM | POA: Diagnosis not present

## 2019-09-14 DIAGNOSIS — Z992 Dependence on renal dialysis: Secondary | ICD-10-CM | POA: Diagnosis not present

## 2019-09-14 DIAGNOSIS — N186 End stage renal disease: Secondary | ICD-10-CM | POA: Diagnosis not present

## 2019-09-15 DIAGNOSIS — N186 End stage renal disease: Secondary | ICD-10-CM | POA: Diagnosis not present

## 2019-09-15 DIAGNOSIS — N2581 Secondary hyperparathyroidism of renal origin: Secondary | ICD-10-CM | POA: Diagnosis not present

## 2019-09-15 DIAGNOSIS — D509 Iron deficiency anemia, unspecified: Secondary | ICD-10-CM | POA: Diagnosis not present

## 2019-09-15 DIAGNOSIS — Z992 Dependence on renal dialysis: Secondary | ICD-10-CM | POA: Diagnosis not present

## 2019-09-15 DIAGNOSIS — D631 Anemia in chronic kidney disease: Secondary | ICD-10-CM | POA: Diagnosis not present

## 2019-09-16 ENCOUNTER — Encounter: Payer: Self-pay | Admitting: Surgery

## 2019-09-16 DIAGNOSIS — Z4781 Encounter for orthopedic aftercare following surgical amputation: Secondary | ICD-10-CM | POA: Diagnosis not present

## 2019-09-16 DIAGNOSIS — E1151 Type 2 diabetes mellitus with diabetic peripheral angiopathy without gangrene: Secondary | ICD-10-CM | POA: Diagnosis not present

## 2019-09-16 DIAGNOSIS — Z992 Dependence on renal dialysis: Secondary | ICD-10-CM | POA: Diagnosis not present

## 2019-09-16 DIAGNOSIS — F17210 Nicotine dependence, cigarettes, uncomplicated: Secondary | ICD-10-CM | POA: Diagnosis not present

## 2019-09-16 DIAGNOSIS — Z89611 Acquired absence of right leg above knee: Secondary | ICD-10-CM | POA: Diagnosis not present

## 2019-09-16 DIAGNOSIS — M1990 Unspecified osteoarthritis, unspecified site: Secondary | ICD-10-CM | POA: Diagnosis not present

## 2019-09-16 DIAGNOSIS — I120 Hypertensive chronic kidney disease with stage 5 chronic kidney disease or end stage renal disease: Secondary | ICD-10-CM | POA: Diagnosis not present

## 2019-09-16 DIAGNOSIS — Z9181 History of falling: Secondary | ICD-10-CM | POA: Diagnosis not present

## 2019-09-16 DIAGNOSIS — K219 Gastro-esophageal reflux disease without esophagitis: Secondary | ICD-10-CM | POA: Diagnosis not present

## 2019-09-16 DIAGNOSIS — E1122 Type 2 diabetes mellitus with diabetic chronic kidney disease: Secondary | ICD-10-CM | POA: Diagnosis not present

## 2019-09-16 DIAGNOSIS — G43909 Migraine, unspecified, not intractable, without status migrainosus: Secondary | ICD-10-CM | POA: Diagnosis not present

## 2019-09-16 DIAGNOSIS — E039 Hypothyroidism, unspecified: Secondary | ICD-10-CM | POA: Diagnosis not present

## 2019-09-16 DIAGNOSIS — E785 Hyperlipidemia, unspecified: Secondary | ICD-10-CM | POA: Diagnosis not present

## 2019-09-16 DIAGNOSIS — J449 Chronic obstructive pulmonary disease, unspecified: Secondary | ICD-10-CM | POA: Diagnosis not present

## 2019-09-16 DIAGNOSIS — F419 Anxiety disorder, unspecified: Secondary | ICD-10-CM | POA: Diagnosis not present

## 2019-09-16 DIAGNOSIS — Z794 Long term (current) use of insulin: Secondary | ICD-10-CM | POA: Diagnosis not present

## 2019-09-16 DIAGNOSIS — E1142 Type 2 diabetes mellitus with diabetic polyneuropathy: Secondary | ICD-10-CM | POA: Diagnosis not present

## 2019-09-16 DIAGNOSIS — F329 Major depressive disorder, single episode, unspecified: Secondary | ICD-10-CM | POA: Diagnosis not present

## 2019-09-16 DIAGNOSIS — N186 End stage renal disease: Secondary | ICD-10-CM | POA: Diagnosis not present

## 2019-09-17 DIAGNOSIS — D509 Iron deficiency anemia, unspecified: Secondary | ICD-10-CM | POA: Diagnosis not present

## 2019-09-17 DIAGNOSIS — N2581 Secondary hyperparathyroidism of renal origin: Secondary | ICD-10-CM | POA: Diagnosis not present

## 2019-09-17 DIAGNOSIS — N186 End stage renal disease: Secondary | ICD-10-CM | POA: Diagnosis not present

## 2019-09-17 DIAGNOSIS — Z992 Dependence on renal dialysis: Secondary | ICD-10-CM | POA: Diagnosis not present

## 2019-09-17 DIAGNOSIS — D631 Anemia in chronic kidney disease: Secondary | ICD-10-CM | POA: Diagnosis not present

## 2019-09-18 DIAGNOSIS — E1122 Type 2 diabetes mellitus with diabetic chronic kidney disease: Secondary | ICD-10-CM | POA: Diagnosis not present

## 2019-09-18 DIAGNOSIS — I120 Hypertensive chronic kidney disease with stage 5 chronic kidney disease or end stage renal disease: Secondary | ICD-10-CM | POA: Diagnosis not present

## 2019-09-18 DIAGNOSIS — E1142 Type 2 diabetes mellitus with diabetic polyneuropathy: Secondary | ICD-10-CM | POA: Diagnosis not present

## 2019-09-18 DIAGNOSIS — Z4781 Encounter for orthopedic aftercare following surgical amputation: Secondary | ICD-10-CM | POA: Diagnosis not present

## 2019-09-18 DIAGNOSIS — E1151 Type 2 diabetes mellitus with diabetic peripheral angiopathy without gangrene: Secondary | ICD-10-CM | POA: Diagnosis not present

## 2019-09-18 DIAGNOSIS — N186 End stage renal disease: Secondary | ICD-10-CM | POA: Diagnosis not present

## 2019-09-19 DIAGNOSIS — N2581 Secondary hyperparathyroidism of renal origin: Secondary | ICD-10-CM | POA: Diagnosis not present

## 2019-09-19 DIAGNOSIS — D509 Iron deficiency anemia, unspecified: Secondary | ICD-10-CM | POA: Diagnosis not present

## 2019-09-19 DIAGNOSIS — D631 Anemia in chronic kidney disease: Secondary | ICD-10-CM | POA: Diagnosis not present

## 2019-09-19 DIAGNOSIS — Z992 Dependence on renal dialysis: Secondary | ICD-10-CM | POA: Diagnosis not present

## 2019-09-19 DIAGNOSIS — N186 End stage renal disease: Secondary | ICD-10-CM | POA: Diagnosis not present

## 2019-09-21 DIAGNOSIS — E1151 Type 2 diabetes mellitus with diabetic peripheral angiopathy without gangrene: Secondary | ICD-10-CM | POA: Diagnosis not present

## 2019-09-21 DIAGNOSIS — E1122 Type 2 diabetes mellitus with diabetic chronic kidney disease: Secondary | ICD-10-CM | POA: Diagnosis not present

## 2019-09-21 DIAGNOSIS — I120 Hypertensive chronic kidney disease with stage 5 chronic kidney disease or end stage renal disease: Secondary | ICD-10-CM | POA: Diagnosis not present

## 2019-09-21 DIAGNOSIS — E1142 Type 2 diabetes mellitus with diabetic polyneuropathy: Secondary | ICD-10-CM | POA: Diagnosis not present

## 2019-09-21 DIAGNOSIS — Z4781 Encounter for orthopedic aftercare following surgical amputation: Secondary | ICD-10-CM | POA: Diagnosis not present

## 2019-09-21 DIAGNOSIS — N186 End stage renal disease: Secondary | ICD-10-CM | POA: Diagnosis not present

## 2019-09-22 DIAGNOSIS — Z992 Dependence on renal dialysis: Secondary | ICD-10-CM | POA: Diagnosis not present

## 2019-09-22 DIAGNOSIS — N2581 Secondary hyperparathyroidism of renal origin: Secondary | ICD-10-CM | POA: Diagnosis not present

## 2019-09-22 DIAGNOSIS — N186 End stage renal disease: Secondary | ICD-10-CM | POA: Diagnosis not present

## 2019-09-22 DIAGNOSIS — D509 Iron deficiency anemia, unspecified: Secondary | ICD-10-CM | POA: Diagnosis not present

## 2019-09-22 DIAGNOSIS — D631 Anemia in chronic kidney disease: Secondary | ICD-10-CM | POA: Diagnosis not present

## 2019-09-24 DIAGNOSIS — D509 Iron deficiency anemia, unspecified: Secondary | ICD-10-CM | POA: Diagnosis not present

## 2019-09-24 DIAGNOSIS — D631 Anemia in chronic kidney disease: Secondary | ICD-10-CM | POA: Diagnosis not present

## 2019-09-24 DIAGNOSIS — N186 End stage renal disease: Secondary | ICD-10-CM | POA: Diagnosis not present

## 2019-09-24 DIAGNOSIS — N2581 Secondary hyperparathyroidism of renal origin: Secondary | ICD-10-CM | POA: Diagnosis not present

## 2019-09-24 DIAGNOSIS — Z992 Dependence on renal dialysis: Secondary | ICD-10-CM | POA: Diagnosis not present

## 2019-09-25 DIAGNOSIS — Z4781 Encounter for orthopedic aftercare following surgical amputation: Secondary | ICD-10-CM | POA: Diagnosis not present

## 2019-09-25 DIAGNOSIS — N186 End stage renal disease: Secondary | ICD-10-CM | POA: Diagnosis not present

## 2019-09-25 DIAGNOSIS — E1122 Type 2 diabetes mellitus with diabetic chronic kidney disease: Secondary | ICD-10-CM | POA: Diagnosis not present

## 2019-09-25 DIAGNOSIS — E1151 Type 2 diabetes mellitus with diabetic peripheral angiopathy without gangrene: Secondary | ICD-10-CM | POA: Diagnosis not present

## 2019-09-25 DIAGNOSIS — E1142 Type 2 diabetes mellitus with diabetic polyneuropathy: Secondary | ICD-10-CM | POA: Diagnosis not present

## 2019-09-25 DIAGNOSIS — I120 Hypertensive chronic kidney disease with stage 5 chronic kidney disease or end stage renal disease: Secondary | ICD-10-CM | POA: Diagnosis not present

## 2019-09-26 DIAGNOSIS — N186 End stage renal disease: Secondary | ICD-10-CM | POA: Diagnosis not present

## 2019-09-26 DIAGNOSIS — D631 Anemia in chronic kidney disease: Secondary | ICD-10-CM | POA: Diagnosis not present

## 2019-09-26 DIAGNOSIS — D509 Iron deficiency anemia, unspecified: Secondary | ICD-10-CM | POA: Diagnosis not present

## 2019-09-26 DIAGNOSIS — N2581 Secondary hyperparathyroidism of renal origin: Secondary | ICD-10-CM | POA: Diagnosis not present

## 2019-09-26 DIAGNOSIS — Z992 Dependence on renal dialysis: Secondary | ICD-10-CM | POA: Diagnosis not present

## 2019-09-28 DIAGNOSIS — I120 Hypertensive chronic kidney disease with stage 5 chronic kidney disease or end stage renal disease: Secondary | ICD-10-CM | POA: Diagnosis not present

## 2019-09-28 DIAGNOSIS — Z4781 Encounter for orthopedic aftercare following surgical amputation: Secondary | ICD-10-CM | POA: Diagnosis not present

## 2019-09-28 DIAGNOSIS — N186 End stage renal disease: Secondary | ICD-10-CM | POA: Diagnosis not present

## 2019-09-28 DIAGNOSIS — E1142 Type 2 diabetes mellitus with diabetic polyneuropathy: Secondary | ICD-10-CM | POA: Diagnosis not present

## 2019-09-28 DIAGNOSIS — E1151 Type 2 diabetes mellitus with diabetic peripheral angiopathy without gangrene: Secondary | ICD-10-CM | POA: Diagnosis not present

## 2019-09-28 DIAGNOSIS — E1122 Type 2 diabetes mellitus with diabetic chronic kidney disease: Secondary | ICD-10-CM | POA: Diagnosis not present

## 2019-09-29 DIAGNOSIS — N186 End stage renal disease: Secondary | ICD-10-CM | POA: Diagnosis not present

## 2019-09-29 DIAGNOSIS — D509 Iron deficiency anemia, unspecified: Secondary | ICD-10-CM | POA: Diagnosis not present

## 2019-09-29 DIAGNOSIS — Z992 Dependence on renal dialysis: Secondary | ICD-10-CM | POA: Diagnosis not present

## 2019-09-29 DIAGNOSIS — D631 Anemia in chronic kidney disease: Secondary | ICD-10-CM | POA: Diagnosis not present

## 2019-09-29 DIAGNOSIS — N2581 Secondary hyperparathyroidism of renal origin: Secondary | ICD-10-CM | POA: Diagnosis not present

## 2019-09-30 DIAGNOSIS — Z4781 Encounter for orthopedic aftercare following surgical amputation: Secondary | ICD-10-CM | POA: Diagnosis not present

## 2019-09-30 DIAGNOSIS — E1142 Type 2 diabetes mellitus with diabetic polyneuropathy: Secondary | ICD-10-CM | POA: Diagnosis not present

## 2019-09-30 DIAGNOSIS — N186 End stage renal disease: Secondary | ICD-10-CM | POA: Diagnosis not present

## 2019-09-30 DIAGNOSIS — E1122 Type 2 diabetes mellitus with diabetic chronic kidney disease: Secondary | ICD-10-CM | POA: Diagnosis not present

## 2019-09-30 DIAGNOSIS — I120 Hypertensive chronic kidney disease with stage 5 chronic kidney disease or end stage renal disease: Secondary | ICD-10-CM | POA: Diagnosis not present

## 2019-09-30 DIAGNOSIS — E1151 Type 2 diabetes mellitus with diabetic peripheral angiopathy without gangrene: Secondary | ICD-10-CM | POA: Diagnosis not present

## 2019-10-02 DIAGNOSIS — N186 End stage renal disease: Secondary | ICD-10-CM | POA: Diagnosis not present

## 2019-10-02 DIAGNOSIS — E1142 Type 2 diabetes mellitus with diabetic polyneuropathy: Secondary | ICD-10-CM | POA: Diagnosis not present

## 2019-10-02 DIAGNOSIS — Z4781 Encounter for orthopedic aftercare following surgical amputation: Secondary | ICD-10-CM | POA: Diagnosis not present

## 2019-10-02 DIAGNOSIS — E1151 Type 2 diabetes mellitus with diabetic peripheral angiopathy without gangrene: Secondary | ICD-10-CM | POA: Diagnosis not present

## 2019-10-02 DIAGNOSIS — E1122 Type 2 diabetes mellitus with diabetic chronic kidney disease: Secondary | ICD-10-CM | POA: Diagnosis not present

## 2019-10-02 DIAGNOSIS — I120 Hypertensive chronic kidney disease with stage 5 chronic kidney disease or end stage renal disease: Secondary | ICD-10-CM | POA: Diagnosis not present

## 2019-10-03 DIAGNOSIS — D509 Iron deficiency anemia, unspecified: Secondary | ICD-10-CM | POA: Diagnosis not present

## 2019-10-03 DIAGNOSIS — N186 End stage renal disease: Secondary | ICD-10-CM | POA: Diagnosis not present

## 2019-10-03 DIAGNOSIS — Z992 Dependence on renal dialysis: Secondary | ICD-10-CM | POA: Diagnosis not present

## 2019-10-03 DIAGNOSIS — D631 Anemia in chronic kidney disease: Secondary | ICD-10-CM | POA: Diagnosis not present

## 2019-10-03 DIAGNOSIS — N2581 Secondary hyperparathyroidism of renal origin: Secondary | ICD-10-CM | POA: Diagnosis not present

## 2019-10-05 DIAGNOSIS — E1151 Type 2 diabetes mellitus with diabetic peripheral angiopathy without gangrene: Secondary | ICD-10-CM | POA: Diagnosis not present

## 2019-10-05 DIAGNOSIS — N186 End stage renal disease: Secondary | ICD-10-CM | POA: Diagnosis not present

## 2019-10-05 DIAGNOSIS — I120 Hypertensive chronic kidney disease with stage 5 chronic kidney disease or end stage renal disease: Secondary | ICD-10-CM | POA: Diagnosis not present

## 2019-10-05 DIAGNOSIS — E1142 Type 2 diabetes mellitus with diabetic polyneuropathy: Secondary | ICD-10-CM | POA: Diagnosis not present

## 2019-10-05 DIAGNOSIS — E1122 Type 2 diabetes mellitus with diabetic chronic kidney disease: Secondary | ICD-10-CM | POA: Diagnosis not present

## 2019-10-05 DIAGNOSIS — Z4781 Encounter for orthopedic aftercare following surgical amputation: Secondary | ICD-10-CM | POA: Diagnosis not present

## 2019-10-06 DIAGNOSIS — N2581 Secondary hyperparathyroidism of renal origin: Secondary | ICD-10-CM | POA: Diagnosis not present

## 2019-10-06 DIAGNOSIS — N186 End stage renal disease: Secondary | ICD-10-CM | POA: Diagnosis not present

## 2019-10-06 DIAGNOSIS — D631 Anemia in chronic kidney disease: Secondary | ICD-10-CM | POA: Diagnosis not present

## 2019-10-06 DIAGNOSIS — D509 Iron deficiency anemia, unspecified: Secondary | ICD-10-CM | POA: Diagnosis not present

## 2019-10-06 DIAGNOSIS — Z992 Dependence on renal dialysis: Secondary | ICD-10-CM | POA: Diagnosis not present

## 2019-10-07 DIAGNOSIS — E1142 Type 2 diabetes mellitus with diabetic polyneuropathy: Secondary | ICD-10-CM | POA: Diagnosis not present

## 2019-10-07 DIAGNOSIS — E1122 Type 2 diabetes mellitus with diabetic chronic kidney disease: Secondary | ICD-10-CM | POA: Diagnosis not present

## 2019-10-07 DIAGNOSIS — D509 Iron deficiency anemia, unspecified: Secondary | ICD-10-CM | POA: Diagnosis not present

## 2019-10-07 DIAGNOSIS — I120 Hypertensive chronic kidney disease with stage 5 chronic kidney disease or end stage renal disease: Secondary | ICD-10-CM | POA: Diagnosis not present

## 2019-10-07 DIAGNOSIS — E1151 Type 2 diabetes mellitus with diabetic peripheral angiopathy without gangrene: Secondary | ICD-10-CM | POA: Diagnosis not present

## 2019-10-07 DIAGNOSIS — Z4781 Encounter for orthopedic aftercare following surgical amputation: Secondary | ICD-10-CM | POA: Diagnosis not present

## 2019-10-07 DIAGNOSIS — N2581 Secondary hyperparathyroidism of renal origin: Secondary | ICD-10-CM | POA: Diagnosis not present

## 2019-10-07 DIAGNOSIS — D631 Anemia in chronic kidney disease: Secondary | ICD-10-CM | POA: Diagnosis not present

## 2019-10-07 DIAGNOSIS — N186 End stage renal disease: Secondary | ICD-10-CM | POA: Diagnosis not present

## 2019-10-07 DIAGNOSIS — Z992 Dependence on renal dialysis: Secondary | ICD-10-CM | POA: Diagnosis not present

## 2019-10-08 DIAGNOSIS — D509 Iron deficiency anemia, unspecified: Secondary | ICD-10-CM | POA: Diagnosis not present

## 2019-10-08 DIAGNOSIS — D631 Anemia in chronic kidney disease: Secondary | ICD-10-CM | POA: Diagnosis not present

## 2019-10-08 DIAGNOSIS — Z992 Dependence on renal dialysis: Secondary | ICD-10-CM | POA: Diagnosis not present

## 2019-10-08 DIAGNOSIS — N2581 Secondary hyperparathyroidism of renal origin: Secondary | ICD-10-CM | POA: Diagnosis not present

## 2019-10-08 DIAGNOSIS — N186 End stage renal disease: Secondary | ICD-10-CM | POA: Diagnosis not present

## 2019-10-09 DIAGNOSIS — E1142 Type 2 diabetes mellitus with diabetic polyneuropathy: Secondary | ICD-10-CM | POA: Diagnosis not present

## 2019-10-09 DIAGNOSIS — I120 Hypertensive chronic kidney disease with stage 5 chronic kidney disease or end stage renal disease: Secondary | ICD-10-CM | POA: Diagnosis not present

## 2019-10-09 DIAGNOSIS — E1122 Type 2 diabetes mellitus with diabetic chronic kidney disease: Secondary | ICD-10-CM | POA: Diagnosis not present

## 2019-10-09 DIAGNOSIS — Z4781 Encounter for orthopedic aftercare following surgical amputation: Secondary | ICD-10-CM | POA: Diagnosis not present

## 2019-10-09 DIAGNOSIS — E1151 Type 2 diabetes mellitus with diabetic peripheral angiopathy without gangrene: Secondary | ICD-10-CM | POA: Diagnosis not present

## 2019-10-09 DIAGNOSIS — N186 End stage renal disease: Secondary | ICD-10-CM | POA: Diagnosis not present

## 2019-10-10 DIAGNOSIS — N186 End stage renal disease: Secondary | ICD-10-CM | POA: Diagnosis not present

## 2019-10-10 DIAGNOSIS — D631 Anemia in chronic kidney disease: Secondary | ICD-10-CM | POA: Diagnosis not present

## 2019-10-10 DIAGNOSIS — Z992 Dependence on renal dialysis: Secondary | ICD-10-CM | POA: Diagnosis not present

## 2019-10-10 DIAGNOSIS — N2581 Secondary hyperparathyroidism of renal origin: Secondary | ICD-10-CM | POA: Diagnosis not present

## 2019-10-10 DIAGNOSIS — D509 Iron deficiency anemia, unspecified: Secondary | ICD-10-CM | POA: Diagnosis not present

## 2019-10-11 DIAGNOSIS — Z992 Dependence on renal dialysis: Secondary | ICD-10-CM | POA: Diagnosis not present

## 2019-10-11 DIAGNOSIS — N186 End stage renal disease: Secondary | ICD-10-CM | POA: Diagnosis not present

## 2019-10-12 DIAGNOSIS — D509 Iron deficiency anemia, unspecified: Secondary | ICD-10-CM | POA: Diagnosis not present

## 2019-10-12 DIAGNOSIS — Z4781 Encounter for orthopedic aftercare following surgical amputation: Secondary | ICD-10-CM | POA: Diagnosis not present

## 2019-10-12 DIAGNOSIS — I120 Hypertensive chronic kidney disease with stage 5 chronic kidney disease or end stage renal disease: Secondary | ICD-10-CM | POA: Diagnosis not present

## 2019-10-12 DIAGNOSIS — Z992 Dependence on renal dialysis: Secondary | ICD-10-CM | POA: Diagnosis not present

## 2019-10-12 DIAGNOSIS — D631 Anemia in chronic kidney disease: Secondary | ICD-10-CM | POA: Diagnosis not present

## 2019-10-12 DIAGNOSIS — E1122 Type 2 diabetes mellitus with diabetic chronic kidney disease: Secondary | ICD-10-CM | POA: Diagnosis not present

## 2019-10-12 DIAGNOSIS — E1151 Type 2 diabetes mellitus with diabetic peripheral angiopathy without gangrene: Secondary | ICD-10-CM | POA: Diagnosis not present

## 2019-10-12 DIAGNOSIS — N2581 Secondary hyperparathyroidism of renal origin: Secondary | ICD-10-CM | POA: Diagnosis not present

## 2019-10-12 DIAGNOSIS — N186 End stage renal disease: Secondary | ICD-10-CM | POA: Diagnosis not present

## 2019-10-12 DIAGNOSIS — E1142 Type 2 diabetes mellitus with diabetic polyneuropathy: Secondary | ICD-10-CM | POA: Diagnosis not present

## 2019-10-13 ENCOUNTER — Other Ambulatory Visit: Payer: Self-pay | Admitting: *Deleted

## 2019-10-13 DIAGNOSIS — N2581 Secondary hyperparathyroidism of renal origin: Secondary | ICD-10-CM | POA: Diagnosis not present

## 2019-10-13 DIAGNOSIS — N186 End stage renal disease: Secondary | ICD-10-CM | POA: Diagnosis not present

## 2019-10-13 DIAGNOSIS — D631 Anemia in chronic kidney disease: Secondary | ICD-10-CM | POA: Diagnosis not present

## 2019-10-13 DIAGNOSIS — D509 Iron deficiency anemia, unspecified: Secondary | ICD-10-CM | POA: Diagnosis not present

## 2019-10-13 DIAGNOSIS — Z992 Dependence on renal dialysis: Secondary | ICD-10-CM | POA: Diagnosis not present

## 2019-10-13 NOTE — Patient Outreach (Signed)
[facility]) Care Management  10/13/2019  [name] [birth year] [id number]   RN Health Coach Monthly Outreach  Referral Date:  06/26/2019 Referral Source:  Transfer from [place] Reason for Referral:  Continued Disease Management Education Insurance:  Medicare   Outreach Attempt:  Outreach attempt #1 to patient for follow up. No answer. RN Health Coach left HIPAA compliant voicemail message along with contact information.  Plan:  RN Health Coach will make another outreach attempt to patient within the month of April if no return call back from patient.   [name] [phone] [email or web address]

## 2019-10-14 DIAGNOSIS — E1122 Type 2 diabetes mellitus with diabetic chronic kidney disease: Secondary | ICD-10-CM | POA: Diagnosis not present

## 2019-10-14 DIAGNOSIS — Z4781 Encounter for orthopedic aftercare following surgical amputation: Secondary | ICD-10-CM | POA: Diagnosis not present

## 2019-10-14 DIAGNOSIS — E1142 Type 2 diabetes mellitus with diabetic polyneuropathy: Secondary | ICD-10-CM | POA: Diagnosis not present

## 2019-10-14 DIAGNOSIS — I120 Hypertensive chronic kidney disease with stage 5 chronic kidney disease or end stage renal disease: Secondary | ICD-10-CM | POA: Diagnosis not present

## 2019-10-14 DIAGNOSIS — N186 End stage renal disease: Secondary | ICD-10-CM | POA: Diagnosis not present

## 2019-10-14 DIAGNOSIS — E1151 Type 2 diabetes mellitus with diabetic peripheral angiopathy without gangrene: Secondary | ICD-10-CM | POA: Diagnosis not present

## 2019-10-15 DIAGNOSIS — Z992 Dependence on renal dialysis: Secondary | ICD-10-CM | POA: Diagnosis not present

## 2019-10-15 DIAGNOSIS — D509 Iron deficiency anemia, unspecified: Secondary | ICD-10-CM | POA: Diagnosis not present

## 2019-10-15 DIAGNOSIS — N2581 Secondary hyperparathyroidism of renal origin: Secondary | ICD-10-CM | POA: Diagnosis not present

## 2019-10-15 DIAGNOSIS — D631 Anemia in chronic kidney disease: Secondary | ICD-10-CM | POA: Diagnosis not present

## 2019-10-15 DIAGNOSIS — N186 End stage renal disease: Secondary | ICD-10-CM | POA: Diagnosis not present

## 2019-10-16 DIAGNOSIS — E11621 Type 2 diabetes mellitus with foot ulcer: Secondary | ICD-10-CM | POA: Diagnosis not present

## 2019-10-16 DIAGNOSIS — K219 Gastro-esophageal reflux disease without esophagitis: Secondary | ICD-10-CM | POA: Diagnosis not present

## 2019-10-16 DIAGNOSIS — N186 End stage renal disease: Secondary | ICD-10-CM | POA: Diagnosis not present

## 2019-10-16 DIAGNOSIS — F329 Major depressive disorder, single episode, unspecified: Secondary | ICD-10-CM | POA: Diagnosis not present

## 2019-10-16 DIAGNOSIS — E785 Hyperlipidemia, unspecified: Secondary | ICD-10-CM | POA: Diagnosis not present

## 2019-10-16 DIAGNOSIS — F419 Anxiety disorder, unspecified: Secondary | ICD-10-CM | POA: Diagnosis not present

## 2019-10-16 DIAGNOSIS — Z89611 Acquired absence of right leg above knee: Secondary | ICD-10-CM | POA: Diagnosis not present

## 2019-10-16 DIAGNOSIS — E039 Hypothyroidism, unspecified: Secondary | ICD-10-CM | POA: Diagnosis not present

## 2019-10-16 DIAGNOSIS — I120 Hypertensive chronic kidney disease with stage 5 chronic kidney disease or end stage renal disease: Secondary | ICD-10-CM | POA: Diagnosis not present

## 2019-10-16 DIAGNOSIS — E1151 Type 2 diabetes mellitus with diabetic peripheral angiopathy without gangrene: Secondary | ICD-10-CM | POA: Diagnosis not present

## 2019-10-16 DIAGNOSIS — L97401 Non-pressure chronic ulcer of unspecified heel and midfoot limited to breakdown of skin: Secondary | ICD-10-CM | POA: Diagnosis not present

## 2019-10-16 DIAGNOSIS — M1990 Unspecified osteoarthritis, unspecified site: Secondary | ICD-10-CM | POA: Diagnosis not present

## 2019-10-16 DIAGNOSIS — Z794 Long term (current) use of insulin: Secondary | ICD-10-CM | POA: Diagnosis not present

## 2019-10-16 DIAGNOSIS — J449 Chronic obstructive pulmonary disease, unspecified: Secondary | ICD-10-CM | POA: Diagnosis not present

## 2019-10-16 DIAGNOSIS — F17210 Nicotine dependence, cigarettes, uncomplicated: Secondary | ICD-10-CM | POA: Diagnosis not present

## 2019-10-16 DIAGNOSIS — E1142 Type 2 diabetes mellitus with diabetic polyneuropathy: Secondary | ICD-10-CM | POA: Diagnosis not present

## 2019-10-16 DIAGNOSIS — G43909 Migraine, unspecified, not intractable, without status migrainosus: Secondary | ICD-10-CM | POA: Diagnosis not present

## 2019-10-16 DIAGNOSIS — E1122 Type 2 diabetes mellitus with diabetic chronic kidney disease: Secondary | ICD-10-CM | POA: Diagnosis not present

## 2019-10-16 DIAGNOSIS — Z992 Dependence on renal dialysis: Secondary | ICD-10-CM | POA: Diagnosis not present

## 2019-10-16 DIAGNOSIS — Z9181 History of falling: Secondary | ICD-10-CM | POA: Diagnosis not present

## 2019-10-17 DIAGNOSIS — Z992 Dependence on renal dialysis: Secondary | ICD-10-CM | POA: Diagnosis not present

## 2019-10-17 DIAGNOSIS — N2581 Secondary hyperparathyroidism of renal origin: Secondary | ICD-10-CM | POA: Diagnosis not present

## 2019-10-17 DIAGNOSIS — D509 Iron deficiency anemia, unspecified: Secondary | ICD-10-CM | POA: Diagnosis not present

## 2019-10-17 DIAGNOSIS — N186 End stage renal disease: Secondary | ICD-10-CM | POA: Diagnosis not present

## 2019-10-17 DIAGNOSIS — D631 Anemia in chronic kidney disease: Secondary | ICD-10-CM | POA: Diagnosis not present

## 2019-10-19 DIAGNOSIS — Z23 Encounter for immunization: Secondary | ICD-10-CM | POA: Diagnosis not present

## 2019-10-20 DIAGNOSIS — D631 Anemia in chronic kidney disease: Secondary | ICD-10-CM | POA: Diagnosis not present

## 2019-10-20 DIAGNOSIS — Z992 Dependence on renal dialysis: Secondary | ICD-10-CM | POA: Diagnosis not present

## 2019-10-20 DIAGNOSIS — N2581 Secondary hyperparathyroidism of renal origin: Secondary | ICD-10-CM | POA: Diagnosis not present

## 2019-10-20 DIAGNOSIS — N186 End stage renal disease: Secondary | ICD-10-CM | POA: Diagnosis not present

## 2019-10-20 DIAGNOSIS — D509 Iron deficiency anemia, unspecified: Secondary | ICD-10-CM | POA: Diagnosis not present

## 2019-10-21 DIAGNOSIS — L97401 Non-pressure chronic ulcer of unspecified heel and midfoot limited to breakdown of skin: Secondary | ICD-10-CM | POA: Diagnosis not present

## 2019-10-21 DIAGNOSIS — I120 Hypertensive chronic kidney disease with stage 5 chronic kidney disease or end stage renal disease: Secondary | ICD-10-CM | POA: Diagnosis not present

## 2019-10-21 DIAGNOSIS — E1142 Type 2 diabetes mellitus with diabetic polyneuropathy: Secondary | ICD-10-CM | POA: Diagnosis not present

## 2019-10-21 DIAGNOSIS — E11621 Type 2 diabetes mellitus with foot ulcer: Secondary | ICD-10-CM | POA: Diagnosis not present

## 2019-10-21 DIAGNOSIS — N186 End stage renal disease: Secondary | ICD-10-CM | POA: Diagnosis not present

## 2019-10-21 DIAGNOSIS — E1122 Type 2 diabetes mellitus with diabetic chronic kidney disease: Secondary | ICD-10-CM | POA: Diagnosis not present

## 2019-10-22 DIAGNOSIS — D509 Iron deficiency anemia, unspecified: Secondary | ICD-10-CM | POA: Diagnosis not present

## 2019-10-22 DIAGNOSIS — N2581 Secondary hyperparathyroidism of renal origin: Secondary | ICD-10-CM | POA: Diagnosis not present

## 2019-10-22 DIAGNOSIS — Z992 Dependence on renal dialysis: Secondary | ICD-10-CM | POA: Diagnosis not present

## 2019-10-22 DIAGNOSIS — N186 End stage renal disease: Secondary | ICD-10-CM | POA: Diagnosis not present

## 2019-10-22 DIAGNOSIS — D631 Anemia in chronic kidney disease: Secondary | ICD-10-CM | POA: Diagnosis not present

## 2019-10-23 DIAGNOSIS — I120 Hypertensive chronic kidney disease with stage 5 chronic kidney disease or end stage renal disease: Secondary | ICD-10-CM | POA: Diagnosis not present

## 2019-10-23 DIAGNOSIS — E1142 Type 2 diabetes mellitus with diabetic polyneuropathy: Secondary | ICD-10-CM | POA: Diagnosis not present

## 2019-10-23 DIAGNOSIS — E11621 Type 2 diabetes mellitus with foot ulcer: Secondary | ICD-10-CM | POA: Diagnosis not present

## 2019-10-23 DIAGNOSIS — N186 End stage renal disease: Secondary | ICD-10-CM | POA: Diagnosis not present

## 2019-10-23 DIAGNOSIS — E1122 Type 2 diabetes mellitus with diabetic chronic kidney disease: Secondary | ICD-10-CM | POA: Diagnosis not present

## 2019-10-23 DIAGNOSIS — L97401 Non-pressure chronic ulcer of unspecified heel and midfoot limited to breakdown of skin: Secondary | ICD-10-CM | POA: Diagnosis not present

## 2019-10-24 DIAGNOSIS — Z992 Dependence on renal dialysis: Secondary | ICD-10-CM | POA: Diagnosis not present

## 2019-10-24 DIAGNOSIS — D631 Anemia in chronic kidney disease: Secondary | ICD-10-CM | POA: Diagnosis not present

## 2019-10-24 DIAGNOSIS — N2581 Secondary hyperparathyroidism of renal origin: Secondary | ICD-10-CM | POA: Diagnosis not present

## 2019-10-24 DIAGNOSIS — N186 End stage renal disease: Secondary | ICD-10-CM | POA: Diagnosis not present

## 2019-10-24 DIAGNOSIS — D509 Iron deficiency anemia, unspecified: Secondary | ICD-10-CM | POA: Diagnosis not present

## 2019-10-26 DIAGNOSIS — E1122 Type 2 diabetes mellitus with diabetic chronic kidney disease: Secondary | ICD-10-CM | POA: Diagnosis not present

## 2019-10-26 DIAGNOSIS — L97401 Non-pressure chronic ulcer of unspecified heel and midfoot limited to breakdown of skin: Secondary | ICD-10-CM | POA: Diagnosis not present

## 2019-10-26 DIAGNOSIS — N186 End stage renal disease: Secondary | ICD-10-CM | POA: Diagnosis not present

## 2019-10-26 DIAGNOSIS — E11621 Type 2 diabetes mellitus with foot ulcer: Secondary | ICD-10-CM | POA: Diagnosis not present

## 2019-10-26 DIAGNOSIS — E1142 Type 2 diabetes mellitus with diabetic polyneuropathy: Secondary | ICD-10-CM | POA: Diagnosis not present

## 2019-10-26 DIAGNOSIS — I120 Hypertensive chronic kidney disease with stage 5 chronic kidney disease or end stage renal disease: Secondary | ICD-10-CM | POA: Diagnosis not present

## 2019-10-27 DIAGNOSIS — N2581 Secondary hyperparathyroidism of renal origin: Secondary | ICD-10-CM | POA: Diagnosis not present

## 2019-10-27 DIAGNOSIS — Z992 Dependence on renal dialysis: Secondary | ICD-10-CM | POA: Diagnosis not present

## 2019-10-27 DIAGNOSIS — N186 End stage renal disease: Secondary | ICD-10-CM | POA: Diagnosis not present

## 2019-10-27 DIAGNOSIS — D509 Iron deficiency anemia, unspecified: Secondary | ICD-10-CM | POA: Diagnosis not present

## 2019-10-27 DIAGNOSIS — D631 Anemia in chronic kidney disease: Secondary | ICD-10-CM | POA: Diagnosis not present

## 2019-10-28 DIAGNOSIS — N186 End stage renal disease: Secondary | ICD-10-CM | POA: Diagnosis not present

## 2019-10-28 DIAGNOSIS — L97401 Non-pressure chronic ulcer of unspecified heel and midfoot limited to breakdown of skin: Secondary | ICD-10-CM | POA: Diagnosis not present

## 2019-10-28 DIAGNOSIS — E1142 Type 2 diabetes mellitus with diabetic polyneuropathy: Secondary | ICD-10-CM | POA: Diagnosis not present

## 2019-10-28 DIAGNOSIS — E1122 Type 2 diabetes mellitus with diabetic chronic kidney disease: Secondary | ICD-10-CM | POA: Diagnosis not present

## 2019-10-28 DIAGNOSIS — I120 Hypertensive chronic kidney disease with stage 5 chronic kidney disease or end stage renal disease: Secondary | ICD-10-CM | POA: Diagnosis not present

## 2019-10-28 DIAGNOSIS — E11621 Type 2 diabetes mellitus with foot ulcer: Secondary | ICD-10-CM | POA: Diagnosis not present

## 2019-10-29 DIAGNOSIS — D631 Anemia in chronic kidney disease: Secondary | ICD-10-CM | POA: Diagnosis not present

## 2019-10-29 DIAGNOSIS — Z992 Dependence on renal dialysis: Secondary | ICD-10-CM | POA: Diagnosis not present

## 2019-10-29 DIAGNOSIS — N186 End stage renal disease: Secondary | ICD-10-CM | POA: Diagnosis not present

## 2019-10-29 DIAGNOSIS — D509 Iron deficiency anemia, unspecified: Secondary | ICD-10-CM | POA: Diagnosis not present

## 2019-10-29 DIAGNOSIS — N2581 Secondary hyperparathyroidism of renal origin: Secondary | ICD-10-CM | POA: Diagnosis not present

## 2019-10-31 DIAGNOSIS — N2581 Secondary hyperparathyroidism of renal origin: Secondary | ICD-10-CM | POA: Diagnosis not present

## 2019-10-31 DIAGNOSIS — Z992 Dependence on renal dialysis: Secondary | ICD-10-CM | POA: Diagnosis not present

## 2019-10-31 DIAGNOSIS — D631 Anemia in chronic kidney disease: Secondary | ICD-10-CM | POA: Diagnosis not present

## 2019-10-31 DIAGNOSIS — N186 End stage renal disease: Secondary | ICD-10-CM | POA: Diagnosis not present

## 2019-10-31 DIAGNOSIS — D509 Iron deficiency anemia, unspecified: Secondary | ICD-10-CM | POA: Diagnosis not present

## 2019-11-02 DIAGNOSIS — E1142 Type 2 diabetes mellitus with diabetic polyneuropathy: Secondary | ICD-10-CM | POA: Diagnosis not present

## 2019-11-02 DIAGNOSIS — E11621 Type 2 diabetes mellitus with foot ulcer: Secondary | ICD-10-CM | POA: Diagnosis not present

## 2019-11-02 DIAGNOSIS — I120 Hypertensive chronic kidney disease with stage 5 chronic kidney disease or end stage renal disease: Secondary | ICD-10-CM | POA: Diagnosis not present

## 2019-11-02 DIAGNOSIS — E1122 Type 2 diabetes mellitus with diabetic chronic kidney disease: Secondary | ICD-10-CM | POA: Diagnosis not present

## 2019-11-02 DIAGNOSIS — L97401 Non-pressure chronic ulcer of unspecified heel and midfoot limited to breakdown of skin: Secondary | ICD-10-CM | POA: Diagnosis not present

## 2019-11-02 DIAGNOSIS — N186 End stage renal disease: Secondary | ICD-10-CM | POA: Diagnosis not present

## 2019-11-03 DIAGNOSIS — Z794 Long term (current) use of insulin: Secondary | ICD-10-CM | POA: Diagnosis not present

## 2019-11-03 DIAGNOSIS — E119 Type 2 diabetes mellitus without complications: Secondary | ICD-10-CM | POA: Diagnosis not present

## 2019-11-03 DIAGNOSIS — D631 Anemia in chronic kidney disease: Secondary | ICD-10-CM | POA: Diagnosis not present

## 2019-11-03 DIAGNOSIS — N2581 Secondary hyperparathyroidism of renal origin: Secondary | ICD-10-CM | POA: Diagnosis not present

## 2019-11-03 DIAGNOSIS — N186 End stage renal disease: Secondary | ICD-10-CM | POA: Diagnosis not present

## 2019-11-03 DIAGNOSIS — Z992 Dependence on renal dialysis: Secondary | ICD-10-CM | POA: Diagnosis not present

## 2019-11-03 DIAGNOSIS — D509 Iron deficiency anemia, unspecified: Secondary | ICD-10-CM | POA: Diagnosis not present

## 2019-11-04 ENCOUNTER — Other Ambulatory Visit: Payer: Self-pay | Admitting: *Deleted

## 2019-11-04 DIAGNOSIS — L97401 Non-pressure chronic ulcer of unspecified heel and midfoot limited to breakdown of skin: Secondary | ICD-10-CM | POA: Diagnosis not present

## 2019-11-04 DIAGNOSIS — E1122 Type 2 diabetes mellitus with diabetic chronic kidney disease: Secondary | ICD-10-CM | POA: Diagnosis not present

## 2019-11-04 DIAGNOSIS — I120 Hypertensive chronic kidney disease with stage 5 chronic kidney disease or end stage renal disease: Secondary | ICD-10-CM | POA: Diagnosis not present

## 2019-11-04 DIAGNOSIS — T8789 Other complications of amputation stump: Secondary | ICD-10-CM

## 2019-11-04 DIAGNOSIS — E11621 Type 2 diabetes mellitus with foot ulcer: Secondary | ICD-10-CM | POA: Diagnosis not present

## 2019-11-04 DIAGNOSIS — E1142 Type 2 diabetes mellitus with diabetic polyneuropathy: Secondary | ICD-10-CM | POA: Diagnosis not present

## 2019-11-04 DIAGNOSIS — I739 Peripheral vascular disease, unspecified: Secondary | ICD-10-CM

## 2019-11-04 DIAGNOSIS — N186 End stage renal disease: Secondary | ICD-10-CM | POA: Diagnosis not present

## 2019-11-05 ENCOUNTER — Ambulatory Visit (HOSPITAL_COMMUNITY)
Admission: RE | Admit: 2019-11-05 | Discharge: 2019-11-05 | Disposition: A | Discharge status: Home / Self Care | Payer: Medicare Other | Source: Ambulatory Visit | Attending: Vascular Surgery | Admitting: Vascular Surgery

## 2019-11-05 ENCOUNTER — Other Ambulatory Visit: Payer: Self-pay

## 2019-11-05 ENCOUNTER — Encounter: Payer: Self-pay | Admitting: Vascular Surgery

## 2019-11-05 ENCOUNTER — Ambulatory Visit (INDEPENDENT_AMBULATORY_CARE_PROVIDER_SITE_OTHER): Payer: Medicare Other | Admitting: Vascular Surgery

## 2019-11-05 VITALS — BP 120/62 | HR 65 | Temp 97.8°F | Resp 20 | Ht 75.0 in | Wt 249.0 lb

## 2019-11-05 DIAGNOSIS — I739 Peripheral vascular disease, unspecified: Secondary | ICD-10-CM

## 2019-11-05 DIAGNOSIS — D509 Iron deficiency anemia, unspecified: Secondary | ICD-10-CM | POA: Diagnosis not present

## 2019-11-05 DIAGNOSIS — Z992 Dependence on renal dialysis: Secondary | ICD-10-CM | POA: Diagnosis not present

## 2019-11-05 DIAGNOSIS — N2581 Secondary hyperparathyroidism of renal origin: Secondary | ICD-10-CM | POA: Diagnosis not present

## 2019-11-05 DIAGNOSIS — D631 Anemia in chronic kidney disease: Secondary | ICD-10-CM | POA: Diagnosis not present

## 2019-11-05 DIAGNOSIS — T8789 Other complications of amputation stump: Secondary | ICD-10-CM | POA: Insufficient documentation

## 2019-11-05 DIAGNOSIS — N186 End stage renal disease: Secondary | ICD-10-CM | POA: Diagnosis not present

## 2019-11-05 NOTE — Progress Notes (Signed)
Patient is a 69 year old male who returns for follow-up today after right above-knee amputation September 2020.  He currently has a small ulceration on the left heel.  He has a shrinker for his right above-knee amputation but has not yet been fitted for prosthetic.  He complains of involuntary jerking in the right leg and some occasional phantom pain.  He is on Lyrica for this.  He also has end-stage renal disease and is on hemodialysis.  Previously had atherectomy of the left mid superficial femoral artery by Dr. Gwenlyn Found in 2018.  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    Current Outpatient Medications on File Prior to Visit  Medication Sig Dispense Refill  . albuterol (VENTOLIN HFA) 108 (90 Base) MCG/ACT inhaler Inhale 1-2 puffs into the lungs every 6 (six) hours as needed for wheezing or shortness of breath.    . ARTIFICIAL TEAR OP Place 1 drop into both eyes every 6 (six) hours as needed (dry eyes).    Marland Kitchen atorvastatin (LIPITOR) 80 MG tablet Take 1 tablet (80 mg total) by mouth at bedtime. (Patient taking differently: Take 80 mg by mouth daily. ) 30 tablet 5  . bismuth subsalicylate (PEPTO BISMOL) 262 MG/15ML suspension Take 30 mLs by mouth every 6 (six) hours as needed for indigestion.    . calcium acetate (PHOSLO) 667 MG capsule Take 4 capsules (2,668 mg total) by mouth 3 (three) times daily with meals. (Patient taking differently: Take [phone] mg by mouth See admin instructions. Take 2668 mg with meals and 1334 with each snack) 360 capsule 0  . cinacalcet (SENSIPAR) 30 MG tablet Take 1 tablet (30 mg total) by mouth every evening. 60 tablet 0  . clopidogrel (PLAVIX) 75 MG tablet Take 1 tablet (75 mg total) by mouth daily. 30 tablet 0  . diphenhydrAMINE (BENADRYL) 25 MG tablet Take 25 mg by mouth daily as needed for itching.    Marland Kitchen ibuprofen (ADVIL) 200 MG tablet Take 800 mg by mouth 3 (three) times daily as needed for headache or  moderate pain.    Marland Kitchen levothyroxine (SYNTHROID) 125 MCG tablet Take 1 tablet (125 mcg total) by mouth daily before breakfast. (Patient taking differently: Take 125 mcg by mouth at bedtime. ) 30 tablet 0  . lisinopril (ZESTRIL) 10 MG tablet Take 10 mg by mouth daily.    . Melatonin 10 MG CAPS Take 10 mg by mouth at bedtime.    . multivitamin (RENA-VIT) TABS tablet Take 1 tablet by mouth daily.    . polyethylene glycol (MIRALAX / GLYCOLAX) 17 g packet Take 17 g by mouth daily as needed for moderate constipation.    . pregabalin (LYRICA) 150 MG capsule Take 1 capsule (150 mg total) by mouth 3 (three) times daily. 90 capsule 0  . sevelamer carbonate (RENVELA) 800 MG tablet Take 1,600 mg by mouth 3 (three) times daily.    [name] 300 UNIT/ML SOPN Inject 54-84 Units as directed at bedtime as needed (if blood sugar is 140 or higher).     . collagenase (SANTYL) ointment Apply 1 application topically daily as needed (wound care).    . Sennosides 15 MG TABS Take 1 tablet by mouth as needed.     No current facility-administered medications on file prior to visit.    Past Medical History:  Diagnosis Date  . Anemia   . Anxiety   . Arthritis   . Chronic kidney disease    Dialysis  T/Th/Sa  . COPD (chronic obstructive pulmonary disease) ([place])    Pt denies  . Depression   . Diabetes mellitus without complication ([place])   . GERD (gastroesophageal reflux disease)   . H/O hiatal hernia   . Headache(784.0)    Hx: Migraines  . History of kidney stones   . Hypertension   . Neuropathy   . Non-healing wound of amputation stump ([place])    right  . Numbness of toes    toes and feet  . Pneumonia    Hx: of several times - pt denies (04/24/19)  . Renal insufficiency   . Shortness of breath dyspnea   . Type 2 diabetes mellitus ([place])     Past Surgical History:  Procedure Laterality Date  . A/V FISTULAGRAM Left 01/16/2019   Procedure: A/V FISTULAGRAM;  Surgeon: [name], MD;  Location: [place] [facility];  Service: Cardiovascular;  Laterality: Left;  . AMPUTATION Right 12/01/2018   Procedure: RIGHT AMPUTATION BELOW KNEE;  Surgeon: [name], MD;  Location: [facility] OR;  Service: Vascular;  Laterality: Right;  . AMPUTATION Right 04/27/2019   Procedure: AMPUTATION BELOW KNEE REVISION;  Surgeon: [name], MD;  Location: [place];  Service: Vascular;  Laterality: Right;  . AMPUTATION Right 04/30/2019   Procedure: AMPUTATION ABOVE KNEE - RIGHT;  Surgeon: Waynetta Sandy, MD;  Location: [place];  Service: Vascular;  Laterality: Right;  . APPLICATION OF WOUND VAC Right 04/27/2019   Procedure: APPLICATION OF WOUND VAC;  Surgeon: [name], MD;  Location: [place];  Service: Vascular;  Laterality: Right;  . AV FISTULA PLACEMENT  2012      left arm   . AV FISTULA PLACEMENT Left 12/18/2012   Procedure: ARTERIOVENOUS (AV) FISTULA CREATION;  Surgeon: Angelia Mould, MD;  Location: [place];  Service: Vascular;  Laterality: Left;  . COLONOSCOPY  10/26/2011   Procedure: COLONOSCOPY;  Surgeon: Rogene Houston, MD;  [facility];  Service: Endoscopy;  Laterality: N/A;  730  . EMBOLECTOMY Left 12/09/2012   Procedure: EMBOLECTOMY;  Surgeon: Serafina Mitchell, MD;  Location: [facility] CATH LAB;  Service: Cardiovascular;  Laterality: Left;  left arm venous embolization  . ESOPHAGOGASTRODUODENOSCOPY (EGD) WITH ESOPHAGEAL DILATION N/A 04/23/2013   Procedure: ESOPHAGOGASTRODUODENOSCOPY (EGD) WITH ESOPHAGEAL DILATION;  Surgeon: Rogene Houston, MD;  [facility];  Service: Endoscopy;  Laterality: N/A;  200-moved to 930   . FISTULA SUPERFICIALIZATION Left 06/18/2013   Procedure: FISTULA SUPERFICIALIZATION & LIGATION BRANCH X 1;  Surgeon: Mal Misty, MD;  Location: [place];  Service: Vascular;  Laterality: Left;  . HEMATOMA EVACUATION Right 02/11/2017   Procedure: EVACUATION HEMATOMA RIGHT GROIN, Repair of Right Pseudo-anerysm.;  Surgeon: [name], MD;  Location: [facility]  OR;  Service: Vascular;  Laterality: Right;  . INGUINAL HERNIA REPAIR     ,  times   2  . INSERTION OF DIALYSIS CATHETER Left 12/18/2012   Procedure: INSERTION OF DIALYSIS CATHETER;  Surgeon: Angelia Mould, MD;  Location: [place];  Service: Vascular;  Laterality: Left;  . KNEE ARTHROSCOPY  2011   Right Knee  . LOWER EXTREMITY ANGIOGRAPHY N/A 02/11/2017   Procedure: Lower Extremity Angiography;  Surgeon: Lorretta Harp, MD;  Location: [place] [facility];  Service: Cardiovascular;  Laterality: N/A;  . PERIPHERAL ATHRECTOMY  02/11/2017  . PERIPHERAL VASCULAR ATHERECTOMY Left 02/11/2017   Procedure: Peripheral Vascular Atherectomy;  Surgeon: Lorretta Harp, MD;  Location: [place]  [facility];  Service: Cardiovascular;  Laterality: Left;  . PERIPHERAL VASCULAR BALLOON ANGIOPLASTY Left 01/16/2019   Procedure: PERIPHERAL VASCULAR BALLOON ANGIOPLASTY;  Surgeon: [name], MD;  Location: [place] [facility];  Service: Cardiovascular;  Laterality: Left;  central vein  . REVISON OF ARTERIOVENOUS FISTULA Left 6/80/3212   Procedure: PLICATION OF LEFT BRACHIOCEPHALIC ARTERIOVENOUS FISTULA;  Surgeon: Conrad Laconia, MD;  Location: [place];  Service: Vascular;  Laterality: Left;  . SHUNTOGRAM N/A 12/09/2012   Procedure: fistulogram;  Surgeon: Serafina Mitchell, MD;  Location: [facility] CATH LAB;  Service: Cardiovascular;  Laterality: N/A;  . SHUNTOGRAM Left 06/03/2013   Procedure: Fistulogram;  Surgeon: Serafina Mitchell, MD;  Location: [facility] CATH LAB;  Service: Cardiovascular;  Laterality: Left;  . THROMBECTOMY W/ EMBOLECTOMY Left 12/11/2012   Procedure: THROMBECTOMY ARTERIOVENOUS FISTULA;  Surgeon: Serafina Mitchell, MD;  Location: [place];  Service: Vascular;  Laterality: Left;  . TONSILLECTOMY    . WOUND DEBRIDEMENT Right 02/11/2019   Procedure: DEBRIDEMENT WOUND RIGHT BELOW THE KNEE STUMP;  Surgeon: Serafina Mitchell, MD;  Location: [facility] OR;  Service: Vascular;  Laterality: Right;   Review of systems: He has no shortness  of breath.  He has no chest pain.  Physical exam:  Vitals:   11/05/19 1414  BP: 120/62  Pulse: 65  Resp: 20  Temp: 97.8 F (36.6 C)  SpO2: 99%  Weight: 249 lb (112.9 kg)  Height: 6\' 3"  (1.905 m)   Well-healed right above-knee amputation  Left lower extremity 2 x 3 cm 1 mm depth ulceration left heel with good granulation tissue at the base  Data: Patient had an ABI performed on the left leg today.  Toe pressure was 42.  Vessels were otherwise calcified.  Assessment: Healed right above-knee amputation.  Patient with known peripheral arterial disease left leg now with ulcer on left heel.  He has marginal perfusion to the left leg with a toe pressure of 42.  However, the ulcer does appear to be healing and by the patient's description it has decreased in size by half.  Plan: Follow-up in 4 to 6 weeks to recheck left heel wound.  We will obtain a duplex ultrasound at that office visit.  [name], MD [facility] Office: [phone]

## 2019-11-06 DIAGNOSIS — E11621 Type 2 diabetes mellitus with foot ulcer: Secondary | ICD-10-CM | POA: Diagnosis not present

## 2019-11-06 DIAGNOSIS — N186 End stage renal disease: Secondary | ICD-10-CM | POA: Diagnosis not present

## 2019-11-06 DIAGNOSIS — Z992 Dependence on renal dialysis: Secondary | ICD-10-CM | POA: Diagnosis not present

## 2019-11-06 DIAGNOSIS — E1142 Type 2 diabetes mellitus with diabetic polyneuropathy: Secondary | ICD-10-CM | POA: Diagnosis not present

## 2019-11-06 DIAGNOSIS — D631 Anemia in chronic kidney disease: Secondary | ICD-10-CM | POA: Diagnosis not present

## 2019-11-06 DIAGNOSIS — N2581 Secondary hyperparathyroidism of renal origin: Secondary | ICD-10-CM | POA: Diagnosis not present

## 2019-11-06 DIAGNOSIS — D509 Iron deficiency anemia, unspecified: Secondary | ICD-10-CM | POA: Diagnosis not present

## 2019-11-06 DIAGNOSIS — E1122 Type 2 diabetes mellitus with diabetic chronic kidney disease: Secondary | ICD-10-CM | POA: Diagnosis not present

## 2019-11-06 DIAGNOSIS — L97401 Non-pressure chronic ulcer of unspecified heel and midfoot limited to breakdown of skin: Secondary | ICD-10-CM | POA: Diagnosis not present

## 2019-11-06 DIAGNOSIS — I120 Hypertensive chronic kidney disease with stage 5 chronic kidney disease or end stage renal disease: Secondary | ICD-10-CM | POA: Diagnosis not present

## 2019-11-07 DIAGNOSIS — N186 End stage renal disease: Secondary | ICD-10-CM | POA: Diagnosis not present

## 2019-11-07 DIAGNOSIS — N2581 Secondary hyperparathyroidism of renal origin: Secondary | ICD-10-CM | POA: Diagnosis not present

## 2019-11-07 DIAGNOSIS — D509 Iron deficiency anemia, unspecified: Secondary | ICD-10-CM | POA: Diagnosis not present

## 2019-11-07 DIAGNOSIS — Z992 Dependence on renal dialysis: Secondary | ICD-10-CM | POA: Diagnosis not present

## 2019-11-07 DIAGNOSIS — D631 Anemia in chronic kidney disease: Secondary | ICD-10-CM | POA: Diagnosis not present

## 2019-11-09 ENCOUNTER — Other Ambulatory Visit: Payer: Self-pay | Admitting: *Deleted

## 2019-11-09 DIAGNOSIS — N186 End stage renal disease: Secondary | ICD-10-CM | POA: Diagnosis not present

## 2019-11-09 DIAGNOSIS — E1122 Type 2 diabetes mellitus with diabetic chronic kidney disease: Secondary | ICD-10-CM | POA: Diagnosis not present

## 2019-11-09 DIAGNOSIS — E1142 Type 2 diabetes mellitus with diabetic polyneuropathy: Secondary | ICD-10-CM | POA: Diagnosis not present

## 2019-11-09 DIAGNOSIS — L97401 Non-pressure chronic ulcer of unspecified heel and midfoot limited to breakdown of skin: Secondary | ICD-10-CM | POA: Diagnosis not present

## 2019-11-09 DIAGNOSIS — I739 Peripheral vascular disease, unspecified: Secondary | ICD-10-CM

## 2019-11-09 DIAGNOSIS — E11621 Type 2 diabetes mellitus with foot ulcer: Secondary | ICD-10-CM | POA: Diagnosis not present

## 2019-11-09 DIAGNOSIS — I120 Hypertensive chronic kidney disease with stage 5 chronic kidney disease or end stage renal disease: Secondary | ICD-10-CM | POA: Diagnosis not present

## 2019-11-10 DIAGNOSIS — N186 End stage renal disease: Secondary | ICD-10-CM | POA: Diagnosis not present

## 2019-11-10 DIAGNOSIS — Z992 Dependence on renal dialysis: Secondary | ICD-10-CM | POA: Diagnosis not present

## 2019-11-10 DIAGNOSIS — N2581 Secondary hyperparathyroidism of renal origin: Secondary | ICD-10-CM | POA: Diagnosis not present

## 2019-11-10 DIAGNOSIS — D631 Anemia in chronic kidney disease: Secondary | ICD-10-CM | POA: Diagnosis not present

## 2019-11-10 DIAGNOSIS — D509 Iron deficiency anemia, unspecified: Secondary | ICD-10-CM | POA: Diagnosis not present

## 2019-11-11 DIAGNOSIS — E1122 Type 2 diabetes mellitus with diabetic chronic kidney disease: Secondary | ICD-10-CM | POA: Diagnosis not present

## 2019-11-11 DIAGNOSIS — E1142 Type 2 diabetes mellitus with diabetic polyneuropathy: Secondary | ICD-10-CM | POA: Diagnosis not present

## 2019-11-11 DIAGNOSIS — N186 End stage renal disease: Secondary | ICD-10-CM | POA: Diagnosis not present

## 2019-11-11 DIAGNOSIS — I120 Hypertensive chronic kidney disease with stage 5 chronic kidney disease or end stage renal disease: Secondary | ICD-10-CM | POA: Diagnosis not present

## 2019-11-11 DIAGNOSIS — Z992 Dependence on renal dialysis: Secondary | ICD-10-CM | POA: Diagnosis not present

## 2019-11-11 DIAGNOSIS — L97401 Non-pressure chronic ulcer of unspecified heel and midfoot limited to breakdown of skin: Secondary | ICD-10-CM | POA: Diagnosis not present

## 2019-11-11 DIAGNOSIS — E11621 Type 2 diabetes mellitus with foot ulcer: Secondary | ICD-10-CM | POA: Diagnosis not present

## 2019-11-12 DIAGNOSIS — Z992 Dependence on renal dialysis: Secondary | ICD-10-CM | POA: Diagnosis not present

## 2019-11-12 DIAGNOSIS — D631 Anemia in chronic kidney disease: Secondary | ICD-10-CM | POA: Diagnosis not present

## 2019-11-12 DIAGNOSIS — D509 Iron deficiency anemia, unspecified: Secondary | ICD-10-CM | POA: Diagnosis not present

## 2019-11-12 DIAGNOSIS — N186 End stage renal disease: Secondary | ICD-10-CM | POA: Diagnosis not present

## 2019-11-12 DIAGNOSIS — N2581 Secondary hyperparathyroidism of renal origin: Secondary | ICD-10-CM | POA: Diagnosis not present

## 2019-11-13 DIAGNOSIS — L97401 Non-pressure chronic ulcer of unspecified heel and midfoot limited to breakdown of skin: Secondary | ICD-10-CM | POA: Diagnosis not present

## 2019-11-13 DIAGNOSIS — E1122 Type 2 diabetes mellitus with diabetic chronic kidney disease: Secondary | ICD-10-CM | POA: Diagnosis not present

## 2019-11-13 DIAGNOSIS — I120 Hypertensive chronic kidney disease with stage 5 chronic kidney disease or end stage renal disease: Secondary | ICD-10-CM | POA: Diagnosis not present

## 2019-11-13 DIAGNOSIS — E11621 Type 2 diabetes mellitus with foot ulcer: Secondary | ICD-10-CM | POA: Diagnosis not present

## 2019-11-13 DIAGNOSIS — N186 End stage renal disease: Secondary | ICD-10-CM | POA: Diagnosis not present

## 2019-11-13 DIAGNOSIS — E1142 Type 2 diabetes mellitus with diabetic polyneuropathy: Secondary | ICD-10-CM | POA: Diagnosis not present

## 2019-11-14 DIAGNOSIS — N186 End stage renal disease: Secondary | ICD-10-CM | POA: Diagnosis not present

## 2019-11-14 DIAGNOSIS — D631 Anemia in chronic kidney disease: Secondary | ICD-10-CM | POA: Diagnosis not present

## 2019-11-14 DIAGNOSIS — D509 Iron deficiency anemia, unspecified: Secondary | ICD-10-CM | POA: Diagnosis not present

## 2019-11-14 DIAGNOSIS — Z992 Dependence on renal dialysis: Secondary | ICD-10-CM | POA: Diagnosis not present

## 2019-11-14 DIAGNOSIS — N2581 Secondary hyperparathyroidism of renal origin: Secondary | ICD-10-CM | POA: Diagnosis not present

## 2019-11-15 DIAGNOSIS — G43909 Migraine, unspecified, not intractable, without status migrainosus: Secondary | ICD-10-CM | POA: Diagnosis not present

## 2019-11-15 DIAGNOSIS — E1122 Type 2 diabetes mellitus with diabetic chronic kidney disease: Secondary | ICD-10-CM | POA: Diagnosis not present

## 2019-11-15 DIAGNOSIS — M1990 Unspecified osteoarthritis, unspecified site: Secondary | ICD-10-CM | POA: Diagnosis not present

## 2019-11-15 DIAGNOSIS — E039 Hypothyroidism, unspecified: Secondary | ICD-10-CM | POA: Diagnosis not present

## 2019-11-15 DIAGNOSIS — J449 Chronic obstructive pulmonary disease, unspecified: Secondary | ICD-10-CM | POA: Diagnosis not present

## 2019-11-15 DIAGNOSIS — F329 Major depressive disorder, single episode, unspecified: Secondary | ICD-10-CM | POA: Diagnosis not present

## 2019-11-15 DIAGNOSIS — Z9181 History of falling: Secondary | ICD-10-CM | POA: Diagnosis not present

## 2019-11-15 DIAGNOSIS — F419 Anxiety disorder, unspecified: Secondary | ICD-10-CM | POA: Diagnosis not present

## 2019-11-15 DIAGNOSIS — Z992 Dependence on renal dialysis: Secondary | ICD-10-CM | POA: Diagnosis not present

## 2019-11-15 DIAGNOSIS — E1151 Type 2 diabetes mellitus with diabetic peripheral angiopathy without gangrene: Secondary | ICD-10-CM | POA: Diagnosis not present

## 2019-11-15 DIAGNOSIS — Z89611 Acquired absence of right leg above knee: Secondary | ICD-10-CM | POA: Diagnosis not present

## 2019-11-15 DIAGNOSIS — E1142 Type 2 diabetes mellitus with diabetic polyneuropathy: Secondary | ICD-10-CM | POA: Diagnosis not present

## 2019-11-15 DIAGNOSIS — E11621 Type 2 diabetes mellitus with foot ulcer: Secondary | ICD-10-CM | POA: Diagnosis not present

## 2019-11-15 DIAGNOSIS — K219 Gastro-esophageal reflux disease without esophagitis: Secondary | ICD-10-CM | POA: Diagnosis not present

## 2019-11-15 DIAGNOSIS — L97401 Non-pressure chronic ulcer of unspecified heel and midfoot limited to breakdown of skin: Secondary | ICD-10-CM | POA: Diagnosis not present

## 2019-11-15 DIAGNOSIS — I120 Hypertensive chronic kidney disease with stage 5 chronic kidney disease or end stage renal disease: Secondary | ICD-10-CM | POA: Diagnosis not present

## 2019-11-15 DIAGNOSIS — N186 End stage renal disease: Secondary | ICD-10-CM | POA: Diagnosis not present

## 2019-11-15 DIAGNOSIS — Z794 Long term (current) use of insulin: Secondary | ICD-10-CM | POA: Diagnosis not present

## 2019-11-15 DIAGNOSIS — E785 Hyperlipidemia, unspecified: Secondary | ICD-10-CM | POA: Diagnosis not present

## 2019-11-15 DIAGNOSIS — F17210 Nicotine dependence, cigarettes, uncomplicated: Secondary | ICD-10-CM | POA: Diagnosis not present

## 2019-11-16 ENCOUNTER — Other Ambulatory Visit: Payer: Self-pay | Admitting: *Deleted

## 2019-11-16 DIAGNOSIS — N186 End stage renal disease: Secondary | ICD-10-CM | POA: Diagnosis not present

## 2019-11-16 DIAGNOSIS — E1122 Type 2 diabetes mellitus with diabetic chronic kidney disease: Secondary | ICD-10-CM | POA: Diagnosis not present

## 2019-11-16 DIAGNOSIS — E1142 Type 2 diabetes mellitus with diabetic polyneuropathy: Secondary | ICD-10-CM | POA: Diagnosis not present

## 2019-11-16 DIAGNOSIS — L97401 Non-pressure chronic ulcer of unspecified heel and midfoot limited to breakdown of skin: Secondary | ICD-10-CM | POA: Diagnosis not present

## 2019-11-16 DIAGNOSIS — I120 Hypertensive chronic kidney disease with stage 5 chronic kidney disease or end stage renal disease: Secondary | ICD-10-CM | POA: Diagnosis not present

## 2019-11-16 DIAGNOSIS — E11621 Type 2 diabetes mellitus with foot ulcer: Secondary | ICD-10-CM | POA: Diagnosis not present

## 2019-11-16 NOTE — Patient Outreach (Signed)
[facility]) Care Management  11/16/2019  [name] 09/06/1950 [id number]   [place] Monthly Outreach  Referral Date: 06/26/2019 Referral Source: Transfer from [place] Reason for Referral: Continued Disease Management Education Insurance: Medicare   Outreach Attempt:  Outreach attempt #2 to patient for follow up. Male answered and stated patient was working with therapist at this time.  RN Health Coach left HIPAA compliant message with male answering line.  Plan:  RN Health Coach will make another outreach attempt within the month of May if no return call back from patient.  [place] [phone] [email or web address]

## 2019-11-17 DIAGNOSIS — N186 End stage renal disease: Secondary | ICD-10-CM | POA: Diagnosis not present

## 2019-11-17 DIAGNOSIS — N2581 Secondary hyperparathyroidism of renal origin: Secondary | ICD-10-CM | POA: Diagnosis not present

## 2019-11-17 DIAGNOSIS — D509 Iron deficiency anemia, unspecified: Secondary | ICD-10-CM | POA: Diagnosis not present

## 2019-11-17 DIAGNOSIS — Z992 Dependence on renal dialysis: Secondary | ICD-10-CM | POA: Diagnosis not present

## 2019-11-17 DIAGNOSIS — D631 Anemia in chronic kidney disease: Secondary | ICD-10-CM | POA: Diagnosis not present

## 2019-11-18 DIAGNOSIS — Z23 Encounter for immunization: Secondary | ICD-10-CM | POA: Diagnosis not present

## 2019-11-19 DIAGNOSIS — N2581 Secondary hyperparathyroidism of renal origin: Secondary | ICD-10-CM | POA: Diagnosis not present

## 2019-11-19 DIAGNOSIS — Z992 Dependence on renal dialysis: Secondary | ICD-10-CM | POA: Diagnosis not present

## 2019-11-19 DIAGNOSIS — D631 Anemia in chronic kidney disease: Secondary | ICD-10-CM | POA: Diagnosis not present

## 2019-11-19 DIAGNOSIS — D509 Iron deficiency anemia, unspecified: Secondary | ICD-10-CM | POA: Diagnosis not present

## 2019-11-19 DIAGNOSIS — N186 End stage renal disease: Secondary | ICD-10-CM | POA: Diagnosis not present

## 2019-11-20 DIAGNOSIS — I120 Hypertensive chronic kidney disease with stage 5 chronic kidney disease or end stage renal disease: Secondary | ICD-10-CM | POA: Diagnosis not present

## 2019-11-20 DIAGNOSIS — E1122 Type 2 diabetes mellitus with diabetic chronic kidney disease: Secondary | ICD-10-CM | POA: Diagnosis not present

## 2019-11-20 DIAGNOSIS — E11621 Type 2 diabetes mellitus with foot ulcer: Secondary | ICD-10-CM | POA: Diagnosis not present

## 2019-11-20 DIAGNOSIS — L97401 Non-pressure chronic ulcer of unspecified heel and midfoot limited to breakdown of skin: Secondary | ICD-10-CM | POA: Diagnosis not present

## 2019-11-20 DIAGNOSIS — E1142 Type 2 diabetes mellitus with diabetic polyneuropathy: Secondary | ICD-10-CM | POA: Diagnosis not present

## 2019-11-20 DIAGNOSIS — N186 End stage renal disease: Secondary | ICD-10-CM | POA: Diagnosis not present

## 2019-11-21 DIAGNOSIS — Z992 Dependence on renal dialysis: Secondary | ICD-10-CM | POA: Diagnosis not present

## 2019-11-21 DIAGNOSIS — N2581 Secondary hyperparathyroidism of renal origin: Secondary | ICD-10-CM | POA: Diagnosis not present

## 2019-11-21 DIAGNOSIS — D509 Iron deficiency anemia, unspecified: Secondary | ICD-10-CM | POA: Diagnosis not present

## 2019-11-21 DIAGNOSIS — D631 Anemia in chronic kidney disease: Secondary | ICD-10-CM | POA: Diagnosis not present

## 2019-11-21 DIAGNOSIS — N186 End stage renal disease: Secondary | ICD-10-CM | POA: Diagnosis not present

## 2019-11-23 DIAGNOSIS — L97401 Non-pressure chronic ulcer of unspecified heel and midfoot limited to breakdown of skin: Secondary | ICD-10-CM | POA: Diagnosis not present

## 2019-11-23 DIAGNOSIS — I120 Hypertensive chronic kidney disease with stage 5 chronic kidney disease or end stage renal disease: Secondary | ICD-10-CM | POA: Diagnosis not present

## 2019-11-23 DIAGNOSIS — E1142 Type 2 diabetes mellitus with diabetic polyneuropathy: Secondary | ICD-10-CM | POA: Diagnosis not present

## 2019-11-23 DIAGNOSIS — E11621 Type 2 diabetes mellitus with foot ulcer: Secondary | ICD-10-CM | POA: Diagnosis not present

## 2019-11-23 DIAGNOSIS — E1122 Type 2 diabetes mellitus with diabetic chronic kidney disease: Secondary | ICD-10-CM | POA: Diagnosis not present

## 2019-11-23 DIAGNOSIS — N186 End stage renal disease: Secondary | ICD-10-CM | POA: Diagnosis not present

## 2019-11-24 DIAGNOSIS — N186 End stage renal disease: Secondary | ICD-10-CM | POA: Diagnosis not present

## 2019-11-24 DIAGNOSIS — D509 Iron deficiency anemia, unspecified: Secondary | ICD-10-CM | POA: Diagnosis not present

## 2019-11-24 DIAGNOSIS — Z992 Dependence on renal dialysis: Secondary | ICD-10-CM | POA: Diagnosis not present

## 2019-11-24 DIAGNOSIS — D631 Anemia in chronic kidney disease: Secondary | ICD-10-CM | POA: Diagnosis not present

## 2019-11-24 DIAGNOSIS — N2581 Secondary hyperparathyroidism of renal origin: Secondary | ICD-10-CM | POA: Diagnosis not present

## 2019-11-25 DIAGNOSIS — I120 Hypertensive chronic kidney disease with stage 5 chronic kidney disease or end stage renal disease: Secondary | ICD-10-CM | POA: Diagnosis not present

## 2019-11-25 DIAGNOSIS — L97401 Non-pressure chronic ulcer of unspecified heel and midfoot limited to breakdown of skin: Secondary | ICD-10-CM | POA: Diagnosis not present

## 2019-11-25 DIAGNOSIS — E1142 Type 2 diabetes mellitus with diabetic polyneuropathy: Secondary | ICD-10-CM | POA: Diagnosis not present

## 2019-11-25 DIAGNOSIS — E1122 Type 2 diabetes mellitus with diabetic chronic kidney disease: Secondary | ICD-10-CM | POA: Diagnosis not present

## 2019-11-25 DIAGNOSIS — N186 End stage renal disease: Secondary | ICD-10-CM | POA: Diagnosis not present

## 2019-11-25 DIAGNOSIS — E11621 Type 2 diabetes mellitus with foot ulcer: Secondary | ICD-10-CM | POA: Diagnosis not present

## 2019-11-26 DIAGNOSIS — N186 End stage renal disease: Secondary | ICD-10-CM | POA: Diagnosis not present

## 2019-11-26 DIAGNOSIS — N2581 Secondary hyperparathyroidism of renal origin: Secondary | ICD-10-CM | POA: Diagnosis not present

## 2019-11-26 DIAGNOSIS — Z992 Dependence on renal dialysis: Secondary | ICD-10-CM | POA: Diagnosis not present

## 2019-11-26 DIAGNOSIS — D631 Anemia in chronic kidney disease: Secondary | ICD-10-CM | POA: Diagnosis not present

## 2019-11-26 DIAGNOSIS — D509 Iron deficiency anemia, unspecified: Secondary | ICD-10-CM | POA: Diagnosis not present

## 2019-11-27 DIAGNOSIS — E11621 Type 2 diabetes mellitus with foot ulcer: Secondary | ICD-10-CM | POA: Diagnosis not present

## 2019-11-27 DIAGNOSIS — L97401 Non-pressure chronic ulcer of unspecified heel and midfoot limited to breakdown of skin: Secondary | ICD-10-CM | POA: Diagnosis not present

## 2019-11-27 DIAGNOSIS — E1122 Type 2 diabetes mellitus with diabetic chronic kidney disease: Secondary | ICD-10-CM | POA: Diagnosis not present

## 2019-11-27 DIAGNOSIS — E1142 Type 2 diabetes mellitus with diabetic polyneuropathy: Secondary | ICD-10-CM | POA: Diagnosis not present

## 2019-11-27 DIAGNOSIS — N186 End stage renal disease: Secondary | ICD-10-CM | POA: Diagnosis not present

## 2019-11-27 DIAGNOSIS — I120 Hypertensive chronic kidney disease with stage 5 chronic kidney disease or end stage renal disease: Secondary | ICD-10-CM | POA: Diagnosis not present

## 2019-11-28 DIAGNOSIS — N2581 Secondary hyperparathyroidism of renal origin: Secondary | ICD-10-CM | POA: Diagnosis not present

## 2019-11-28 DIAGNOSIS — N186 End stage renal disease: Secondary | ICD-10-CM | POA: Diagnosis not present

## 2019-11-28 DIAGNOSIS — Z992 Dependence on renal dialysis: Secondary | ICD-10-CM | POA: Diagnosis not present

## 2019-11-28 DIAGNOSIS — D509 Iron deficiency anemia, unspecified: Secondary | ICD-10-CM | POA: Diagnosis not present

## 2019-11-28 DIAGNOSIS — D631 Anemia in chronic kidney disease: Secondary | ICD-10-CM | POA: Diagnosis not present

## 2019-11-30 DIAGNOSIS — E11621 Type 2 diabetes mellitus with foot ulcer: Secondary | ICD-10-CM | POA: Diagnosis not present

## 2019-11-30 DIAGNOSIS — E1142 Type 2 diabetes mellitus with diabetic polyneuropathy: Secondary | ICD-10-CM | POA: Diagnosis not present

## 2019-11-30 DIAGNOSIS — I120 Hypertensive chronic kidney disease with stage 5 chronic kidney disease or end stage renal disease: Secondary | ICD-10-CM | POA: Diagnosis not present

## 2019-11-30 DIAGNOSIS — L97401 Non-pressure chronic ulcer of unspecified heel and midfoot limited to breakdown of skin: Secondary | ICD-10-CM | POA: Diagnosis not present

## 2019-11-30 DIAGNOSIS — E1122 Type 2 diabetes mellitus with diabetic chronic kidney disease: Secondary | ICD-10-CM | POA: Diagnosis not present

## 2019-11-30 DIAGNOSIS — N186 End stage renal disease: Secondary | ICD-10-CM | POA: Diagnosis not present

## 2019-12-01 DIAGNOSIS — D509 Iron deficiency anemia, unspecified: Secondary | ICD-10-CM | POA: Diagnosis not present

## 2019-12-01 DIAGNOSIS — Z992 Dependence on renal dialysis: Secondary | ICD-10-CM | POA: Diagnosis not present

## 2019-12-01 DIAGNOSIS — N186 End stage renal disease: Secondary | ICD-10-CM | POA: Diagnosis not present

## 2019-12-01 DIAGNOSIS — D631 Anemia in chronic kidney disease: Secondary | ICD-10-CM | POA: Diagnosis not present

## 2019-12-01 DIAGNOSIS — N2581 Secondary hyperparathyroidism of renal origin: Secondary | ICD-10-CM | POA: Diagnosis not present

## 2019-12-02 DIAGNOSIS — E1122 Type 2 diabetes mellitus with diabetic chronic kidney disease: Secondary | ICD-10-CM | POA: Diagnosis not present

## 2019-12-02 DIAGNOSIS — L97401 Non-pressure chronic ulcer of unspecified heel and midfoot limited to breakdown of skin: Secondary | ICD-10-CM | POA: Diagnosis not present

## 2019-12-02 DIAGNOSIS — N186 End stage renal disease: Secondary | ICD-10-CM | POA: Diagnosis not present

## 2019-12-02 DIAGNOSIS — I120 Hypertensive chronic kidney disease with stage 5 chronic kidney disease or end stage renal disease: Secondary | ICD-10-CM | POA: Diagnosis not present

## 2019-12-02 DIAGNOSIS — E1142 Type 2 diabetes mellitus with diabetic polyneuropathy: Secondary | ICD-10-CM | POA: Diagnosis not present

## 2019-12-02 DIAGNOSIS — E11621 Type 2 diabetes mellitus with foot ulcer: Secondary | ICD-10-CM | POA: Diagnosis not present

## 2019-12-03 DIAGNOSIS — N186 End stage renal disease: Secondary | ICD-10-CM | POA: Diagnosis not present

## 2019-12-03 DIAGNOSIS — D509 Iron deficiency anemia, unspecified: Secondary | ICD-10-CM | POA: Diagnosis not present

## 2019-12-03 DIAGNOSIS — N2581 Secondary hyperparathyroidism of renal origin: Secondary | ICD-10-CM | POA: Diagnosis not present

## 2019-12-03 DIAGNOSIS — Z992 Dependence on renal dialysis: Secondary | ICD-10-CM | POA: Diagnosis not present

## 2019-12-03 DIAGNOSIS — D631 Anemia in chronic kidney disease: Secondary | ICD-10-CM | POA: Diagnosis not present

## 2019-12-04 DIAGNOSIS — E11621 Type 2 diabetes mellitus with foot ulcer: Secondary | ICD-10-CM | POA: Diagnosis not present

## 2019-12-04 DIAGNOSIS — I120 Hypertensive chronic kidney disease with stage 5 chronic kidney disease or end stage renal disease: Secondary | ICD-10-CM | POA: Diagnosis not present

## 2019-12-04 DIAGNOSIS — E1142 Type 2 diabetes mellitus with diabetic polyneuropathy: Secondary | ICD-10-CM | POA: Diagnosis not present

## 2019-12-04 DIAGNOSIS — L97401 Non-pressure chronic ulcer of unspecified heel and midfoot limited to breakdown of skin: Secondary | ICD-10-CM | POA: Diagnosis not present

## 2019-12-04 DIAGNOSIS — E1122 Type 2 diabetes mellitus with diabetic chronic kidney disease: Secondary | ICD-10-CM | POA: Diagnosis not present

## 2019-12-04 DIAGNOSIS — N186 End stage renal disease: Secondary | ICD-10-CM | POA: Diagnosis not present

## 2019-12-05 DIAGNOSIS — N2581 Secondary hyperparathyroidism of renal origin: Secondary | ICD-10-CM | POA: Diagnosis not present

## 2019-12-05 DIAGNOSIS — N186 End stage renal disease: Secondary | ICD-10-CM | POA: Diagnosis not present

## 2019-12-05 DIAGNOSIS — D509 Iron deficiency anemia, unspecified: Secondary | ICD-10-CM | POA: Diagnosis not present

## 2019-12-05 DIAGNOSIS — D631 Anemia in chronic kidney disease: Secondary | ICD-10-CM | POA: Diagnosis not present

## 2019-12-05 DIAGNOSIS — Z992 Dependence on renal dialysis: Secondary | ICD-10-CM | POA: Diagnosis not present

## 2019-12-06 DIAGNOSIS — Z992 Dependence on renal dialysis: Secondary | ICD-10-CM | POA: Diagnosis not present

## 2019-12-06 DIAGNOSIS — N186 End stage renal disease: Secondary | ICD-10-CM | POA: Diagnosis not present

## 2019-12-06 DIAGNOSIS — D631 Anemia in chronic kidney disease: Secondary | ICD-10-CM | POA: Diagnosis not present

## 2019-12-06 DIAGNOSIS — N2581 Secondary hyperparathyroidism of renal origin: Secondary | ICD-10-CM | POA: Diagnosis not present

## 2019-12-06 DIAGNOSIS — D509 Iron deficiency anemia, unspecified: Secondary | ICD-10-CM | POA: Diagnosis not present

## 2019-12-07 DIAGNOSIS — N186 End stage renal disease: Secondary | ICD-10-CM | POA: Diagnosis not present

## 2019-12-07 DIAGNOSIS — E1122 Type 2 diabetes mellitus with diabetic chronic kidney disease: Secondary | ICD-10-CM | POA: Diagnosis not present

## 2019-12-07 DIAGNOSIS — L97401 Non-pressure chronic ulcer of unspecified heel and midfoot limited to breakdown of skin: Secondary | ICD-10-CM | POA: Diagnosis not present

## 2019-12-07 DIAGNOSIS — I120 Hypertensive chronic kidney disease with stage 5 chronic kidney disease or end stage renal disease: Secondary | ICD-10-CM | POA: Diagnosis not present

## 2019-12-07 DIAGNOSIS — E11621 Type 2 diabetes mellitus with foot ulcer: Secondary | ICD-10-CM | POA: Diagnosis not present

## 2019-12-07 DIAGNOSIS — E1142 Type 2 diabetes mellitus with diabetic polyneuropathy: Secondary | ICD-10-CM | POA: Diagnosis not present

## 2019-12-08 DIAGNOSIS — Z992 Dependence on renal dialysis: Secondary | ICD-10-CM | POA: Diagnosis not present

## 2019-12-08 DIAGNOSIS — D509 Iron deficiency anemia, unspecified: Secondary | ICD-10-CM | POA: Diagnosis not present

## 2019-12-08 DIAGNOSIS — N2581 Secondary hyperparathyroidism of renal origin: Secondary | ICD-10-CM | POA: Diagnosis not present

## 2019-12-08 DIAGNOSIS — D631 Anemia in chronic kidney disease: Secondary | ICD-10-CM | POA: Diagnosis not present

## 2019-12-08 DIAGNOSIS — N186 End stage renal disease: Secondary | ICD-10-CM | POA: Diagnosis not present

## 2019-12-09 DIAGNOSIS — E1122 Type 2 diabetes mellitus with diabetic chronic kidney disease: Secondary | ICD-10-CM | POA: Diagnosis not present

## 2019-12-09 DIAGNOSIS — I120 Hypertensive chronic kidney disease with stage 5 chronic kidney disease or end stage renal disease: Secondary | ICD-10-CM | POA: Diagnosis not present

## 2019-12-09 DIAGNOSIS — E11621 Type 2 diabetes mellitus with foot ulcer: Secondary | ICD-10-CM | POA: Diagnosis not present

## 2019-12-09 DIAGNOSIS — L97401 Non-pressure chronic ulcer of unspecified heel and midfoot limited to breakdown of skin: Secondary | ICD-10-CM | POA: Diagnosis not present

## 2019-12-09 DIAGNOSIS — E1142 Type 2 diabetes mellitus with diabetic polyneuropathy: Secondary | ICD-10-CM | POA: Diagnosis not present

## 2019-12-09 DIAGNOSIS — N186 End stage renal disease: Secondary | ICD-10-CM | POA: Diagnosis not present

## 2019-12-10 DIAGNOSIS — D509 Iron deficiency anemia, unspecified: Secondary | ICD-10-CM | POA: Diagnosis not present

## 2019-12-10 DIAGNOSIS — N186 End stage renal disease: Secondary | ICD-10-CM | POA: Diagnosis not present

## 2019-12-10 DIAGNOSIS — D631 Anemia in chronic kidney disease: Secondary | ICD-10-CM | POA: Diagnosis not present

## 2019-12-10 DIAGNOSIS — Z992 Dependence on renal dialysis: Secondary | ICD-10-CM | POA: Diagnosis not present

## 2019-12-10 DIAGNOSIS — N2581 Secondary hyperparathyroidism of renal origin: Secondary | ICD-10-CM | POA: Diagnosis not present

## 2019-12-11 DIAGNOSIS — N186 End stage renal disease: Secondary | ICD-10-CM | POA: Diagnosis not present

## 2019-12-11 DIAGNOSIS — Z992 Dependence on renal dialysis: Secondary | ICD-10-CM | POA: Diagnosis not present

## 2019-12-12 DIAGNOSIS — Z992 Dependence on renal dialysis: Secondary | ICD-10-CM | POA: Diagnosis not present

## 2019-12-12 DIAGNOSIS — N2581 Secondary hyperparathyroidism of renal origin: Secondary | ICD-10-CM | POA: Diagnosis not present

## 2019-12-12 DIAGNOSIS — N186 End stage renal disease: Secondary | ICD-10-CM | POA: Diagnosis not present

## 2019-12-12 DIAGNOSIS — D509 Iron deficiency anemia, unspecified: Secondary | ICD-10-CM | POA: Diagnosis not present

## 2019-12-12 DIAGNOSIS — D631 Anemia in chronic kidney disease: Secondary | ICD-10-CM | POA: Diagnosis not present

## 2019-12-14 DIAGNOSIS — L97401 Non-pressure chronic ulcer of unspecified heel and midfoot limited to breakdown of skin: Secondary | ICD-10-CM | POA: Diagnosis not present

## 2019-12-14 DIAGNOSIS — I120 Hypertensive chronic kidney disease with stage 5 chronic kidney disease or end stage renal disease: Secondary | ICD-10-CM | POA: Diagnosis not present

## 2019-12-14 DIAGNOSIS — E1122 Type 2 diabetes mellitus with diabetic chronic kidney disease: Secondary | ICD-10-CM | POA: Diagnosis not present

## 2019-12-14 DIAGNOSIS — N186 End stage renal disease: Secondary | ICD-10-CM | POA: Diagnosis not present

## 2019-12-14 DIAGNOSIS — E1142 Type 2 diabetes mellitus with diabetic polyneuropathy: Secondary | ICD-10-CM | POA: Diagnosis not present

## 2019-12-14 DIAGNOSIS — E11621 Type 2 diabetes mellitus with foot ulcer: Secondary | ICD-10-CM | POA: Diagnosis not present

## 2019-12-15 DIAGNOSIS — E1151 Type 2 diabetes mellitus with diabetic peripheral angiopathy without gangrene: Secondary | ICD-10-CM | POA: Diagnosis not present

## 2019-12-15 DIAGNOSIS — N2581 Secondary hyperparathyroidism of renal origin: Secondary | ICD-10-CM | POA: Diagnosis not present

## 2019-12-15 DIAGNOSIS — Z89611 Acquired absence of right leg above knee: Secondary | ICD-10-CM | POA: Diagnosis not present

## 2019-12-15 DIAGNOSIS — Z7902 Long term (current) use of antithrombotics/antiplatelets: Secondary | ICD-10-CM | POA: Diagnosis not present

## 2019-12-15 DIAGNOSIS — Z992 Dependence on renal dialysis: Secondary | ICD-10-CM | POA: Diagnosis not present

## 2019-12-15 DIAGNOSIS — F329 Major depressive disorder, single episode, unspecified: Secondary | ICD-10-CM | POA: Diagnosis not present

## 2019-12-15 DIAGNOSIS — E11621 Type 2 diabetes mellitus with foot ulcer: Secondary | ICD-10-CM | POA: Diagnosis not present

## 2019-12-15 DIAGNOSIS — Z9181 History of falling: Secondary | ICD-10-CM | POA: Diagnosis not present

## 2019-12-15 DIAGNOSIS — D631 Anemia in chronic kidney disease: Secondary | ICD-10-CM | POA: Diagnosis not present

## 2019-12-15 DIAGNOSIS — E039 Hypothyroidism, unspecified: Secondary | ICD-10-CM | POA: Diagnosis not present

## 2019-12-15 DIAGNOSIS — Z794 Long term (current) use of insulin: Secondary | ICD-10-CM | POA: Diagnosis not present

## 2019-12-15 DIAGNOSIS — G43909 Migraine, unspecified, not intractable, without status migrainosus: Secondary | ICD-10-CM | POA: Diagnosis not present

## 2019-12-15 DIAGNOSIS — M1991 Primary osteoarthritis, unspecified site: Secondary | ICD-10-CM | POA: Diagnosis not present

## 2019-12-15 DIAGNOSIS — L97421 Non-pressure chronic ulcer of left heel and midfoot limited to breakdown of skin: Secondary | ICD-10-CM | POA: Diagnosis not present

## 2019-12-15 DIAGNOSIS — E1142 Type 2 diabetes mellitus with diabetic polyneuropathy: Secondary | ICD-10-CM | POA: Diagnosis not present

## 2019-12-15 DIAGNOSIS — D509 Iron deficiency anemia, unspecified: Secondary | ICD-10-CM | POA: Diagnosis not present

## 2019-12-15 DIAGNOSIS — E1122 Type 2 diabetes mellitus with diabetic chronic kidney disease: Secondary | ICD-10-CM | POA: Diagnosis not present

## 2019-12-15 DIAGNOSIS — F17210 Nicotine dependence, cigarettes, uncomplicated: Secondary | ICD-10-CM | POA: Diagnosis not present

## 2019-12-15 DIAGNOSIS — F419 Anxiety disorder, unspecified: Secondary | ICD-10-CM | POA: Diagnosis not present

## 2019-12-15 DIAGNOSIS — S81812D Laceration without foreign body, left lower leg, subsequent encounter: Secondary | ICD-10-CM | POA: Diagnosis not present

## 2019-12-15 DIAGNOSIS — N186 End stage renal disease: Secondary | ICD-10-CM | POA: Diagnosis not present

## 2019-12-15 DIAGNOSIS — E785 Hyperlipidemia, unspecified: Secondary | ICD-10-CM | POA: Diagnosis not present

## 2019-12-15 DIAGNOSIS — I120 Hypertensive chronic kidney disease with stage 5 chronic kidney disease or end stage renal disease: Secondary | ICD-10-CM | POA: Diagnosis not present

## 2019-12-15 DIAGNOSIS — K219 Gastro-esophageal reflux disease without esophagitis: Secondary | ICD-10-CM | POA: Diagnosis not present

## 2019-12-15 DIAGNOSIS — J449 Chronic obstructive pulmonary disease, unspecified: Secondary | ICD-10-CM | POA: Diagnosis not present

## 2019-12-17 ENCOUNTER — Ambulatory Visit: Payer: Medicare Other | Admitting: Vascular Surgery

## 2019-12-17 ENCOUNTER — Inpatient Hospital Stay (HOSPITAL_COMMUNITY): Admission: RE | Admit: 2019-12-17 | Payer: Medicare Other | Source: Ambulatory Visit

## 2019-12-17 DIAGNOSIS — D509 Iron deficiency anemia, unspecified: Secondary | ICD-10-CM | POA: Diagnosis not present

## 2019-12-17 DIAGNOSIS — Z992 Dependence on renal dialysis: Secondary | ICD-10-CM | POA: Diagnosis not present

## 2019-12-17 DIAGNOSIS — N2581 Secondary hyperparathyroidism of renal origin: Secondary | ICD-10-CM | POA: Diagnosis not present

## 2019-12-17 DIAGNOSIS — D631 Anemia in chronic kidney disease: Secondary | ICD-10-CM | POA: Diagnosis not present

## 2019-12-17 DIAGNOSIS — N186 End stage renal disease: Secondary | ICD-10-CM | POA: Diagnosis not present

## 2019-12-18 DIAGNOSIS — E11621 Type 2 diabetes mellitus with foot ulcer: Secondary | ICD-10-CM | POA: Diagnosis not present

## 2019-12-18 DIAGNOSIS — L97421 Non-pressure chronic ulcer of left heel and midfoot limited to breakdown of skin: Secondary | ICD-10-CM | POA: Diagnosis not present

## 2019-12-18 DIAGNOSIS — I120 Hypertensive chronic kidney disease with stage 5 chronic kidney disease or end stage renal disease: Secondary | ICD-10-CM | POA: Diagnosis not present

## 2019-12-18 DIAGNOSIS — N186 End stage renal disease: Secondary | ICD-10-CM | POA: Diagnosis not present

## 2019-12-18 DIAGNOSIS — E1122 Type 2 diabetes mellitus with diabetic chronic kidney disease: Secondary | ICD-10-CM | POA: Diagnosis not present

## 2019-12-18 DIAGNOSIS — S81812D Laceration without foreign body, left lower leg, subsequent encounter: Secondary | ICD-10-CM | POA: Diagnosis not present

## 2019-12-19 DIAGNOSIS — N186 End stage renal disease: Secondary | ICD-10-CM | POA: Diagnosis not present

## 2019-12-19 DIAGNOSIS — D631 Anemia in chronic kidney disease: Secondary | ICD-10-CM | POA: Diagnosis not present

## 2019-12-19 DIAGNOSIS — N2581 Secondary hyperparathyroidism of renal origin: Secondary | ICD-10-CM | POA: Diagnosis not present

## 2019-12-19 DIAGNOSIS — D509 Iron deficiency anemia, unspecified: Secondary | ICD-10-CM | POA: Diagnosis not present

## 2019-12-19 DIAGNOSIS — Z992 Dependence on renal dialysis: Secondary | ICD-10-CM | POA: Diagnosis not present

## 2019-12-22 DIAGNOSIS — D509 Iron deficiency anemia, unspecified: Secondary | ICD-10-CM | POA: Diagnosis not present

## 2019-12-22 DIAGNOSIS — Z992 Dependence on renal dialysis: Secondary | ICD-10-CM | POA: Diagnosis not present

## 2019-12-22 DIAGNOSIS — N2581 Secondary hyperparathyroidism of renal origin: Secondary | ICD-10-CM | POA: Diagnosis not present

## 2019-12-22 DIAGNOSIS — D631 Anemia in chronic kidney disease: Secondary | ICD-10-CM | POA: Diagnosis not present

## 2019-12-22 DIAGNOSIS — N186 End stage renal disease: Secondary | ICD-10-CM | POA: Diagnosis not present

## 2019-12-23 DIAGNOSIS — I120 Hypertensive chronic kidney disease with stage 5 chronic kidney disease or end stage renal disease: Secondary | ICD-10-CM | POA: Diagnosis not present

## 2019-12-23 DIAGNOSIS — E11621 Type 2 diabetes mellitus with foot ulcer: Secondary | ICD-10-CM | POA: Diagnosis not present

## 2019-12-23 DIAGNOSIS — E1122 Type 2 diabetes mellitus with diabetic chronic kidney disease: Secondary | ICD-10-CM | POA: Diagnosis not present

## 2019-12-23 DIAGNOSIS — S81812D Laceration without foreign body, left lower leg, subsequent encounter: Secondary | ICD-10-CM | POA: Diagnosis not present

## 2019-12-23 DIAGNOSIS — L97421 Non-pressure chronic ulcer of left heel and midfoot limited to breakdown of skin: Secondary | ICD-10-CM | POA: Diagnosis not present

## 2019-12-23 DIAGNOSIS — N186 End stage renal disease: Secondary | ICD-10-CM | POA: Diagnosis not present

## 2019-12-24 ENCOUNTER — Other Ambulatory Visit: Payer: Self-pay | Admitting: *Deleted

## 2019-12-24 DIAGNOSIS — D509 Iron deficiency anemia, unspecified: Secondary | ICD-10-CM | POA: Diagnosis not present

## 2019-12-24 DIAGNOSIS — N2581 Secondary hyperparathyroidism of renal origin: Secondary | ICD-10-CM | POA: Diagnosis not present

## 2019-12-24 DIAGNOSIS — D631 Anemia in chronic kidney disease: Secondary | ICD-10-CM | POA: Diagnosis not present

## 2019-12-24 DIAGNOSIS — Z992 Dependence on renal dialysis: Secondary | ICD-10-CM | POA: Diagnosis not present

## 2019-12-24 DIAGNOSIS — N186 End stage renal disease: Secondary | ICD-10-CM | POA: Diagnosis not present

## 2019-12-24 NOTE — Patient Outreach (Signed)
[facility]) Care Management  12/24/2019  [name] 11-24-1950 [id number]   RN Health Coach Monthly Outreach  Referral Date: 06/26/2019 Referral Source: Transfer from [place] Reason for Referral: Continued Disease Management Education Insurance: Medicare   Outreach Attempt:  Outreach attempt #3 to patient for follow up.  Male answered and stated patient not home.  HIPAA compliant message left with male answering.  Plan:  RN Health Coach will make another outreach attempt within the month of June if no return call back from patient.  [place] [phone] [name]

## 2019-12-25 DIAGNOSIS — I10 Essential (primary) hypertension: Secondary | ICD-10-CM | POA: Diagnosis not present

## 2019-12-25 DIAGNOSIS — K219 Gastro-esophageal reflux disease without esophagitis: Secondary | ICD-10-CM | POA: Diagnosis not present

## 2019-12-25 DIAGNOSIS — E11621 Type 2 diabetes mellitus with foot ulcer: Secondary | ICD-10-CM | POA: Diagnosis not present

## 2019-12-25 DIAGNOSIS — E1143 Type 2 diabetes mellitus with diabetic autonomic (poly)neuropathy: Secondary | ICD-10-CM | POA: Diagnosis not present

## 2019-12-25 DIAGNOSIS — J440 Chronic obstructive pulmonary disease with acute lower respiratory infection: Secondary | ICD-10-CM | POA: Diagnosis not present

## 2019-12-25 DIAGNOSIS — E1122 Type 2 diabetes mellitus with diabetic chronic kidney disease: Secondary | ICD-10-CM | POA: Diagnosis not present

## 2019-12-25 DIAGNOSIS — E782 Mixed hyperlipidemia: Secondary | ICD-10-CM | POA: Diagnosis not present

## 2019-12-25 DIAGNOSIS — E039 Hypothyroidism, unspecified: Secondary | ICD-10-CM | POA: Diagnosis not present

## 2019-12-25 DIAGNOSIS — E1142 Type 2 diabetes mellitus with diabetic polyneuropathy: Secondary | ICD-10-CM | POA: Diagnosis not present

## 2019-12-26 DIAGNOSIS — N186 End stage renal disease: Secondary | ICD-10-CM | POA: Diagnosis not present

## 2019-12-26 DIAGNOSIS — N2581 Secondary hyperparathyroidism of renal origin: Secondary | ICD-10-CM | POA: Diagnosis not present

## 2019-12-26 DIAGNOSIS — Z992 Dependence on renal dialysis: Secondary | ICD-10-CM | POA: Diagnosis not present

## 2019-12-26 DIAGNOSIS — D631 Anemia in chronic kidney disease: Secondary | ICD-10-CM | POA: Diagnosis not present

## 2019-12-26 DIAGNOSIS — D509 Iron deficiency anemia, unspecified: Secondary | ICD-10-CM | POA: Diagnosis not present

## 2019-12-28 DIAGNOSIS — Z992 Dependence on renal dialysis: Secondary | ICD-10-CM | POA: Diagnosis not present

## 2019-12-28 DIAGNOSIS — I739 Peripheral vascular disease, unspecified: Secondary | ICD-10-CM | POA: Diagnosis not present

## 2019-12-28 DIAGNOSIS — I10 Essential (primary) hypertension: Secondary | ICD-10-CM | POA: Diagnosis not present

## 2019-12-28 DIAGNOSIS — E1142 Type 2 diabetes mellitus with diabetic polyneuropathy: Secondary | ICD-10-CM | POA: Diagnosis not present

## 2019-12-28 DIAGNOSIS — E11622 Type 2 diabetes mellitus with other skin ulcer: Secondary | ICD-10-CM | POA: Insufficient documentation

## 2019-12-28 DIAGNOSIS — E1122 Type 2 diabetes mellitus with diabetic chronic kidney disease: Secondary | ICD-10-CM | POA: Diagnosis not present

## 2019-12-28 DIAGNOSIS — L97309 Non-pressure chronic ulcer of unspecified ankle with unspecified severity: Secondary | ICD-10-CM | POA: Insufficient documentation

## 2019-12-28 DIAGNOSIS — K219 Gastro-esophageal reflux disease without esophagitis: Secondary | ICD-10-CM | POA: Diagnosis not present

## 2019-12-28 DIAGNOSIS — J449 Chronic obstructive pulmonary disease, unspecified: Secondary | ICD-10-CM | POA: Diagnosis not present

## 2019-12-29 DIAGNOSIS — D631 Anemia in chronic kidney disease: Secondary | ICD-10-CM | POA: Diagnosis not present

## 2019-12-29 DIAGNOSIS — Z992 Dependence on renal dialysis: Secondary | ICD-10-CM | POA: Diagnosis not present

## 2019-12-29 DIAGNOSIS — D509 Iron deficiency anemia, unspecified: Secondary | ICD-10-CM | POA: Diagnosis not present

## 2019-12-29 DIAGNOSIS — N186 End stage renal disease: Secondary | ICD-10-CM | POA: Diagnosis not present

## 2019-12-29 DIAGNOSIS — N2581 Secondary hyperparathyroidism of renal origin: Secondary | ICD-10-CM | POA: Diagnosis not present

## 2019-12-30 DIAGNOSIS — E11621 Type 2 diabetes mellitus with foot ulcer: Secondary | ICD-10-CM | POA: Diagnosis not present

## 2019-12-30 DIAGNOSIS — I120 Hypertensive chronic kidney disease with stage 5 chronic kidney disease or end stage renal disease: Secondary | ICD-10-CM | POA: Diagnosis not present

## 2019-12-30 DIAGNOSIS — L97421 Non-pressure chronic ulcer of left heel and midfoot limited to breakdown of skin: Secondary | ICD-10-CM | POA: Diagnosis not present

## 2019-12-30 DIAGNOSIS — N186 End stage renal disease: Secondary | ICD-10-CM | POA: Diagnosis not present

## 2019-12-30 DIAGNOSIS — S81812D Laceration without foreign body, left lower leg, subsequent encounter: Secondary | ICD-10-CM | POA: Diagnosis not present

## 2019-12-30 DIAGNOSIS — E1122 Type 2 diabetes mellitus with diabetic chronic kidney disease: Secondary | ICD-10-CM | POA: Diagnosis not present

## 2019-12-31 DIAGNOSIS — N2581 Secondary hyperparathyroidism of renal origin: Secondary | ICD-10-CM | POA: Diagnosis not present

## 2019-12-31 DIAGNOSIS — Z992 Dependence on renal dialysis: Secondary | ICD-10-CM | POA: Diagnosis not present

## 2019-12-31 DIAGNOSIS — D509 Iron deficiency anemia, unspecified: Secondary | ICD-10-CM | POA: Diagnosis not present

## 2019-12-31 DIAGNOSIS — D631 Anemia in chronic kidney disease: Secondary | ICD-10-CM | POA: Diagnosis not present

## 2019-12-31 DIAGNOSIS — N186 End stage renal disease: Secondary | ICD-10-CM | POA: Diagnosis not present

## 2020-01-02 DIAGNOSIS — N186 End stage renal disease: Secondary | ICD-10-CM | POA: Diagnosis not present

## 2020-01-02 DIAGNOSIS — Z992 Dependence on renal dialysis: Secondary | ICD-10-CM | POA: Diagnosis not present

## 2020-01-02 DIAGNOSIS — D631 Anemia in chronic kidney disease: Secondary | ICD-10-CM | POA: Diagnosis not present

## 2020-01-02 DIAGNOSIS — N2581 Secondary hyperparathyroidism of renal origin: Secondary | ICD-10-CM | POA: Diagnosis not present

## 2020-01-02 DIAGNOSIS — D509 Iron deficiency anemia, unspecified: Secondary | ICD-10-CM | POA: Diagnosis not present

## 2020-01-05 DIAGNOSIS — D631 Anemia in chronic kidney disease: Secondary | ICD-10-CM | POA: Diagnosis not present

## 2020-01-05 DIAGNOSIS — N2581 Secondary hyperparathyroidism of renal origin: Secondary | ICD-10-CM | POA: Diagnosis not present

## 2020-01-05 DIAGNOSIS — Z992 Dependence on renal dialysis: Secondary | ICD-10-CM | POA: Diagnosis not present

## 2020-01-05 DIAGNOSIS — D509 Iron deficiency anemia, unspecified: Secondary | ICD-10-CM | POA: Diagnosis not present

## 2020-01-05 DIAGNOSIS — N186 End stage renal disease: Secondary | ICD-10-CM | POA: Diagnosis not present

## 2020-01-06 ENCOUNTER — Encounter: Payer: Self-pay | Admitting: Cardiovascular Disease

## 2020-01-06 ENCOUNTER — Ambulatory Visit (INDEPENDENT_AMBULATORY_CARE_PROVIDER_SITE_OTHER): Payer: Medicare Other | Admitting: Cardiovascular Disease

## 2020-01-06 ENCOUNTER — Other Ambulatory Visit: Payer: Self-pay

## 2020-01-06 VITALS — BP 148/60 | HR 36 | Ht 76.0 in | Wt 247.4 lb

## 2020-01-06 DIAGNOSIS — I120 Hypertensive chronic kidney disease with stage 5 chronic kidney disease or end stage renal disease: Secondary | ICD-10-CM | POA: Diagnosis not present

## 2020-01-06 DIAGNOSIS — E11621 Type 2 diabetes mellitus with foot ulcer: Secondary | ICD-10-CM | POA: Diagnosis not present

## 2020-01-06 DIAGNOSIS — I493 Ventricular premature depolarization: Secondary | ICD-10-CM

## 2020-01-06 DIAGNOSIS — Z992 Dependence on renal dialysis: Secondary | ICD-10-CM

## 2020-01-06 DIAGNOSIS — I739 Peripheral vascular disease, unspecified: Secondary | ICD-10-CM | POA: Diagnosis not present

## 2020-01-06 DIAGNOSIS — S81812D Laceration without foreign body, left lower leg, subsequent encounter: Secondary | ICD-10-CM | POA: Diagnosis not present

## 2020-01-06 DIAGNOSIS — I10 Essential (primary) hypertension: Secondary | ICD-10-CM

## 2020-01-06 DIAGNOSIS — I499 Cardiac arrhythmia, unspecified: Secondary | ICD-10-CM | POA: Diagnosis not present

## 2020-01-06 DIAGNOSIS — R011 Cardiac murmur, unspecified: Secondary | ICD-10-CM

## 2020-01-06 DIAGNOSIS — N186 End stage renal disease: Secondary | ICD-10-CM | POA: Diagnosis not present

## 2020-01-06 DIAGNOSIS — L97421 Non-pressure chronic ulcer of left heel and midfoot limited to breakdown of skin: Secondary | ICD-10-CM | POA: Diagnosis not present

## 2020-01-06 DIAGNOSIS — I498 Other specified cardiac arrhythmias: Secondary | ICD-10-CM | POA: Diagnosis not present

## 2020-01-06 DIAGNOSIS — E1122 Type 2 diabetes mellitus with diabetic chronic kidney disease: Secondary | ICD-10-CM | POA: Diagnosis not present

## 2020-01-06 NOTE — Patient Instructions (Signed)
Medication Instructions:  Continue all current medications.  Labwork: none  Testing/Procedures:  Your physician has requested that you have an echocardiogram. Echocardiography is a painless test that uses sound waves to create images of your heart. It provides your doctor with information about the size and shape of your heart and how well your heart's chambers and valves are working. This procedure takes approximately one hour. There are no restrictions for this procedure.  Office will contact with results via phone or letter.    Follow-Up: 3 months   Any Other Special Instructions Will Be Listed Below (If Applicable).  If you need a refill on your cardiac medications before your next appointment, please call your pharmacy.  

## 2020-01-06 NOTE — Progress Notes (Signed)
CARDIOLOGY CONSULT NOTE  Patient ID: [name] MRN: [id number] DOB/AGE: Dec 23, 1950 69 y.o.  Admit date: (Not on file) Primary Physician: Caryl Bis, MD  Reason for Consultation: Irregular pulse  HPI: [name] is a 69 y.o. male who is being seen today for the evaluation of irregular pulse at the request of Caryl Bis, MD.   I reviewed records from his PCP.  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  I reviewed labs dated 12/26/2019: Total cholesterol 97, triglycerides 289, HDL 22, LDL 32, BUN 37, creatinine 6.58, sodium 141, potassium 4.9, normal liver transaminases, TSH 0.40, hemoglobin A1c 5.9%, white blood cells 8.4, hemoglobin 11.1, platelets 161.  I personally reviewed the ECG performed today which demonstrate sinus rhythm with PVCs in a pattern of ventricular bigeminy.  There is an underlying right bundle branch block.  I reviewed ECGs from previous years and they demonstrate sinus rhythm with right bundle branch block.  He denies palpitations.  He very seldom has chest pains which typically occur after dialysis.  He has chronic exertional dyspnea which is stable.  He smokes half pack of cigarettes daily.  He said he noticed there was an irregularity to his pulse when he was feeling his fistula and he would feel a normal be followed by a beat soon thereafter.  He denies any associated systemic symptoms such as dizziness.  He and his wife are both trying to quit cigarettes and are using nicotine patches.   Family history: He has a brother with severe three-vessel inoperable coronary artery disease diagnosed at age 33.   No Known Allergies  Current Outpatient Medications  Medication Sig Dispense Refill  . albuterol (VENTOLIN HFA)  108 (90 Base) MCG/ACT inhaler Inhale 1-2 puffs into the lungs every 6 (six) hours as needed for wheezing or shortness of breath.    . ARTIFICIAL TEAR OP Place 1 drop into both eyes every 6 (six) hours as needed (dry eyes).    Marland Kitchen atorvastatin (LIPITOR) 80 MG tablet Take 1 tablet (80 mg total) by mouth at bedtime. (Patient taking differently: Take 80 mg by mouth daily. ) 30 tablet 5  . bismuth subsalicylate (PEPTO BISMOL) 262 MG/15ML suspension Take 30 mLs by mouth every 6 (six) hours as needed for indigestion.    . calcium acetate (PHOSLO) 667 MG capsule Take 4 capsules (2,668 mg total) by mouth 3 (three) times daily with meals. (Patient taking differently: Take [phone] mg by mouth See admin instructions. Take 2668 mg with meals and 1334 with each snack) 360 capsule 0  . cinacalcet (SENSIPAR) 30 MG tablet Take 1 tablet (30 mg total) by mouth every evening. 60 tablet 0  . clopidogrel (PLAVIX) 75 MG tablet Take 1 tablet (75 mg total) by mouth daily. 30 tablet 0  . diphenhydrAMINE (BENADRYL) 25 MG tablet Take 25 mg by mouth daily as needed for itching.    Marland Kitchen ibuprofen (ADVIL) 200 MG tablet Take 800 mg by mouth 3 (three) times daily as needed for headache or moderate pain.    Marland Kitchen levothyroxine (SYNTHROID) 125 MCG tablet Take 1 tablet (125 mcg total) by mouth daily before breakfast. (Patient taking differently: Take 125 mcg by mouth at bedtime. ) 30 tablet 0  . lisinopril (ZESTRIL) 10 MG tablet Take 10 mg by mouth daily.    Marland Kitchen  Melatonin 10 MG CAPS Take 10 mg by mouth at bedtime.    . multivitamin (RENA-VIT) TABS tablet Take 1 tablet by mouth daily.    . polyethylene glycol (MIRALAX / GLYCOLAX) 17 g packet Take 17 g by mouth daily as needed for moderate constipation.    . pregabalin (LYRICA) 150 MG capsule Take 1 capsule (150 mg total) by mouth 3 (three) times daily. 90 capsule 0  . sevelamer carbonate (RENVELA) 800 MG tablet Take 1,600 mg by mouth 3 (three) times daily.    [name] 300 UNIT/ML SOPN  Inject 54-84 Units as directed at bedtime as needed (if blood sugar is 140 or higher).      No current facility-administered medications for this visit.    Past Medical History:  Diagnosis Date  . Anemia   . Anxiety   . Arthritis   . Chronic kidney disease    Dialysis T/Th/Sa  . COPD (chronic obstructive pulmonary disease) ([place])    Pt denies  . Depression   . Diabetes mellitus without complication ([place])   . GERD (gastroesophageal reflux disease)   . H/O hiatal hernia   . Headache(784.0)    Hx: Migraines  . History of kidney stones   . Hypertension   . Neuropathy   . Non-healing wound of amputation stump ([place])    right  . Numbness of toes    toes and feet  . Pneumonia    Hx: of several times - pt denies (04/24/19)  . Renal insufficiency   . Shortness of breath dyspnea   . Type 2 diabetes mellitus ([place])     Past Surgical History:  Procedure Laterality Date  . A/V FISTULAGRAM Left 01/16/2019   Procedure: A/V FISTULAGRAM;  Surgeon: [name], MD;  Location: [facility];  Service: Cardiovascular;  Laterality: Left;  . AMPUTATION Right 12/01/2018   Procedure: RIGHT AMPUTATION BELOW KNEE;  Surgeon: [name], MD;  Location[facility] OR;  Service: Vascular;  Laterality: Right;  . AMPUTATION Right 04/27/2019   Procedure: AMPUTATION BELOW KNEE REVISION;  Surgeon: [name], MD;  Location: [place];  Service: Vascular;  Laterality: Right;  . AMPUTATION Right 04/30/2019   Procedure: AMPUTATION ABOVE KNEE - RIGHT;  Surgeon: Waynetta Sandy, MD;  Location: [place];  Service: Vascular;  Laterality: Right;  . APPLICATION OF WOUND VAC Right 04/27/2019   Procedure: APPLICATION OF WOUND VAC;  Surgeon: [name], MD;  Location: [place];  Service: Vascular;  Laterality: Right;  . AV FISTULA PLACEMENT  2012      left arm   . AV FISTULA PLACEMENT Left 12/18/2012   Procedure: ARTERIOVENOUS (AV) FISTULA CREATION;  Surgeon: Angelia Mould, MD;  Location: [place];  Service: Vascular;  Laterality: Left;  . COLONOSCOPY  10/26/2011   Procedure: COLONOSCOPY;  Surgeon: Rogene [place], MD;  [facility];  Service: Endoscopy;  Laterality: N/A;  730  . EMBOLECTOMY Left 12/09/2012   Procedure: EMBOLECTOMY;  Surgeon: Serafina Mitchell, MD;  Location: [facility] CATH LAB;  Service: Cardiovascular;  Laterality: Left;  left arm venous embolization  . ESOPHAGOGASTRODUODENOSCOPY (EGD) WITH ESOPHAGEAL DILATION N/A 04/23/2013   Procedure: ESOPHAGOGASTRODUODENOSCOPY (EGD) WITH ESOPHAGEAL DILATION;  Surgeon: Rogene [place], MD;  [facility];  Service: Endoscopy;  Laterality: N/A;  200-moved to 930   . FISTULA SUPERFICIALIZATION Left 06/18/2013   Procedure: FISTULA SUPERFICIALIZATION & LIGATION BRANCH X 1;  Surgeon: Mal Misty, MD;  Location: [facility]  OR;  Service: Vascular;  Laterality: Left;  . HEMATOMA EVACUATION Right 02/11/2017   Procedure: EVACUATION HEMATOMA RIGHT GROIN, Repair of Right Pseudo-anerysm.;  Surgeon: [name], MD;  Location: [facility] OR;  Service: Vascular;  Laterality: Right;  . INGUINAL HERNIA REPAIR     ,  times   2  . INSERTION OF DIALYSIS CATHETER Left 12/18/2012   Procedure: INSERTION OF DIALYSIS CATHETER;  Surgeon: Angelia Mould, MD;  Location: [place];  Service: Vascular;  Laterality: Left;  . KNEE ARTHROSCOPY  2011   Right Knee  . LOWER EXTREMITY ANGIOGRAPHY N/A 02/11/2017   Procedure: Lower Extremity Angiography;  Surgeon: Lorretta Harp, MD;  Location: [facility] [facility];  Service: Cardiovascular;  Laterality: N/A;  . PERIPHERAL ATHRECTOMY  02/11/2017  . PERIPHERAL VASCULAR ATHERECTOMY Left 02/11/2017   Procedure: Peripheral Vascular Atherectomy;  Surgeon: Lorretta Harp, MD;  Location: [place];  Service: Cardiovascular;  Laterality: Left;  . PERIPHERAL VASCULAR BALLOON ANGIOPLASTY Left 01/16/2019   Procedure: PERIPHERAL VASCULAR BALLOON ANGIOPLASTY;  Surgeon: [name], MD;  Location: [place] [facility];   Service: Cardiovascular;  Laterality: Left;  central vein  . REVISON OF ARTERIOVENOUS FISTULA Left 9/52/8413   Procedure: PLICATION OF LEFT BRACHIOCEPHALIC ARTERIOVENOUS FISTULA;  Surgeon: Conrad Eunice, MD;  Location: [place];  Service: Vascular;  Laterality: Left;  . SHUNTOGRAM N/A 12/09/2012   Procedure: fistulogram;  Surgeon: Serafina Mitchell, MD;  Location: [facility] CATH LAB;  Service: Cardiovascular;  Laterality: N/A;  . SHUNTOGRAM Left 06/03/2013   Procedure: Fistulogram;  Surgeon: Serafina Mitchell, MD;  Location: [facility] CATH LAB;  Service: Cardiovascular;  Laterality: Left;  . THROMBECTOMY W/ EMBOLECTOMY Left 12/11/2012   Procedure: THROMBECTOMY ARTERIOVENOUS FISTULA;  Surgeon: Serafina Mitchell, MD;  Location: [place];  Service: Vascular;  Laterality: Left;  . TONSILLECTOMY    . WOUND DEBRIDEMENT Right 02/11/2019   Procedure: DEBRIDEMENT WOUND RIGHT BELOW THE KNEE STUMP;  Surgeon: Serafina Mitchell, MD;  Location: [facility] OR;  Service: Vascular;  Laterality: Right;    Social History   Socioeconomic History  . Marital status: Married    Spouse name: [name]  . Number of children: Not on file  . Years of education: Not on file  . Highest education level: Not on file  Occupational History  . Occupation: retired  Tobacco Use  . Smoking status: Current Every Day Smoker    Packs/day: 0.50    Years: 10.00    Pack years: 5.00    Types: Cigarettes    Last attempt to quit: 03/13/2012    Years since quitting: 7.8  . Smokeless tobacco: Never Used  . Tobacco comment: smokes 6-8 cigarettes daily  Substance and Sexual Activity  . Alcohol use: No    Alcohol/week: 0.0 standard drinks  . Drug use: No  . Sexual activity: Never  Other Topics Concern  . Not on file  Social History Narrative   Mr Rathke is a 69 year old retired patient who lives with wife [name], his primary caregiver. He reports he is independent/assist with his care needs    He has transportation to medical appointments   Social Determinants of  Health   Financial Resource Strain: Medium Risk  . Difficulty of Paying Living Expenses: Somewhat hard  Food Insecurity: Food Insecurity Present  . Worried About Charity fundraiser in the Last Year: Often true  . Ran Out of Food in the Last Year: Often true  Transportation Needs: No  Transportation Needs  . Lack of Transportation (Medical): No  . Lack of Transportation (Non-Medical): No  Physical Activity: Inactive  . Days of Exercise per Week: 0 days  . Minutes of Exercise per Session: 0 min  Stress: Stress Concern Present  . Feeling of Stress : To some extent  Social Connections:   . Frequency of Communication with Friends and Family:   . Frequency of Social Gatherings with Friends and Family:   . Attends Religious Services:   . Active Member of Clubs or Organizations:   . Attends Archivist Meetings:   Marland Kitchen Marital Status:   Intimate Partner Violence: Not At Risk  . Fear of Current or Ex-Partner: No  . Emotionally Abused: No  . Physically Abused: No  . Sexually Abused: No      Current Meds  Medication Sig  . albuterol (VENTOLIN HFA) 108 (90 Base) MCG/ACT inhaler Inhale 1-2 puffs into the lungs every 6 (six) hours as needed for wheezing or shortness of breath.  . ARTIFICIAL TEAR OP Place 1 drop into both eyes every 6 (six) hours as needed (dry eyes).  Marland Kitchen atorvastatin (LIPITOR) 80 MG tablet Take 1 tablet (80 mg total) by mouth at bedtime. (Patient taking differently: Take 80 mg by mouth daily. )  . bismuth subsalicylate (PEPTO BISMOL) 262 MG/15ML suspension Take 30 mLs by mouth every 6 (six) hours as needed for indigestion.  . calcium acetate (PHOSLO) 667 MG capsule Take 4 capsules (2,668 mg total) by mouth 3 (three) times daily with meals. (Patient taking differently: Take [phone] mg by mouth See admin instructions. Take 2668 mg with meals and 1334 with each snack)  . cinacalcet (SENSIPAR) 30 MG tablet Take 1 tablet (30 mg total) by mouth every evening.  .  clopidogrel (PLAVIX) 75 MG tablet Take 1 tablet (75 mg total) by mouth daily.  . diphenhydrAMINE (BENADRYL) 25 MG tablet Take 25 mg by mouth daily as needed for itching.  Marland Kitchen ibuprofen (ADVIL) 200 MG tablet Take 800 mg by mouth 3 (three) times daily as needed for headache or moderate pain.  Marland Kitchen levothyroxine (SYNTHROID) 125 MCG tablet Take 1 tablet (125 mcg total) by mouth daily before breakfast. (Patient taking differently: Take 125 mcg by mouth at bedtime. )  . lisinopril (ZESTRIL) 10 MG tablet Take 10 mg by mouth daily.  . Melatonin 10 MG CAPS Take 10 mg by mouth at bedtime.  . multivitamin (RENA-VIT) TABS tablet Take 1 tablet by mouth daily.  . polyethylene glycol (MIRALAX / GLYCOLAX) 17 g packet Take 17 g by mouth daily as needed for moderate constipation.  . pregabalin (LYRICA) 150 MG capsule Take 1 capsule (150 mg total) by mouth 3 (three) times daily.  . sevelamer carbonate (RENVELA) 800 MG tablet Take 1,600 mg by mouth 3 (three) times daily.  [name] 300 UNIT/ML SOPN Inject 54-84 Units as directed at bedtime as needed (if blood sugar is 140 or higher).       Review of systems complete and found to be negative unless listed above in HPI    Physical exam Blood pressure (!) 148/60, pulse (!) 36, height 6\' 4"  (1.93 m), weight 247 lb 5.7 oz (112.2 kg), SpO2 98 %. General: NAD Neck: No JVD, no thyromegaly or thyroid nodule.  Lungs: Poor air movement with bilateral expiratory rhonchi and wheezes. CV: Nondisplaced PMI. Regular rate and irregular rhythm with frequent premature contractions, normal S1/S2, no J6/B3, 3/6 systolic murmur heard over RUSB and along left sternal border.  Right AKA.  Trace left leg edema.  No carotid bruit.    Abdomen: Soft, nontender, no distention.  Skin: Intact without lesions or rashes.  Neurologic: Alert and oriented x 3.  Psych: Normal affect. Extremities: No clubbing or cyanosis.  HEENT: Normal.   ECG: Most recent ECG reviewed.   Labs: Lab  Results  Component Value Date/Time   K 4.8 05/02/2019 02:52 AM   BUN 51 (H) 05/02/2019 02:52 AM   BUN 67 (H) 02/08/2017 02:03 PM   CREATININE 8.89 (H) 05/02/2019 02:52 AM   ALT 22 12/02/2015 02:18 PM   TSH 4.230 02/08/2017 02:03 PM   HGB 7.2 (L) 05/01/2019 12:27 PM   HGB 11.5 (L) 02/08/2017 02:09 PM     Lipids: No results found for: LDLCALC, LDLDIRECT, CHOL, TRIG, HDL      ASSESSMENT AND PLAN:   1.  Arrhythmia: ECG demonstrative of ventricular bigeminy.  He denies palpitations altogether.  He seldom has chest pain.  Chronic exertional dyspnea stable and he does have COPD and continues to smoke 1/2 pack of cigarettes daily.  He also has a cardiac murmur. I will order a 2-D echocardiogram with Doppler to evaluate cardiac structure, function, and regional wall motion.  2. Cardiac murmur: I will order a 2-D echocardiogram with Doppler to evaluate cardiac structure, function, and regional wall motion.  3.  Hypertension: BP is mildly elevated.  No changes to therapy today.  4.  End-stage renal disease: On hemodialysis.  5.  Peripheral vascular disease: Status post right AKA in September 2020.  He previously underwent atherectomy of the left mid superficial femoral artery by Dr. Gwenlyn Found in 2018.  He follows with vascular surgery.  Currently on clopidogrel and rosuvastatin.    Disposition: Follow up in 3 months  Signed: Kate Sable, M.D., F.A.C.C.  01/06/2020, 9:26 AM

## 2020-01-07 DIAGNOSIS — D631 Anemia in chronic kidney disease: Secondary | ICD-10-CM | POA: Diagnosis not present

## 2020-01-07 DIAGNOSIS — N186 End stage renal disease: Secondary | ICD-10-CM | POA: Diagnosis not present

## 2020-01-07 DIAGNOSIS — N2581 Secondary hyperparathyroidism of renal origin: Secondary | ICD-10-CM | POA: Diagnosis not present

## 2020-01-07 DIAGNOSIS — Z992 Dependence on renal dialysis: Secondary | ICD-10-CM | POA: Diagnosis not present

## 2020-01-07 DIAGNOSIS — D509 Iron deficiency anemia, unspecified: Secondary | ICD-10-CM | POA: Diagnosis not present

## 2020-01-09 DIAGNOSIS — N2581 Secondary hyperparathyroidism of renal origin: Secondary | ICD-10-CM | POA: Diagnosis not present

## 2020-01-09 DIAGNOSIS — D631 Anemia in chronic kidney disease: Secondary | ICD-10-CM | POA: Diagnosis not present

## 2020-01-09 DIAGNOSIS — Z992 Dependence on renal dialysis: Secondary | ICD-10-CM | POA: Diagnosis not present

## 2020-01-09 DIAGNOSIS — D509 Iron deficiency anemia, unspecified: Secondary | ICD-10-CM | POA: Diagnosis not present

## 2020-01-09 DIAGNOSIS — N186 End stage renal disease: Secondary | ICD-10-CM | POA: Diagnosis not present

## 2020-01-11 DIAGNOSIS — N186 End stage renal disease: Secondary | ICD-10-CM | POA: Diagnosis not present

## 2020-01-11 DIAGNOSIS — Z992 Dependence on renal dialysis: Secondary | ICD-10-CM | POA: Diagnosis not present

## 2020-01-12 DIAGNOSIS — N2581 Secondary hyperparathyroidism of renal origin: Secondary | ICD-10-CM | POA: Diagnosis not present

## 2020-01-12 DIAGNOSIS — D509 Iron deficiency anemia, unspecified: Secondary | ICD-10-CM | POA: Diagnosis not present

## 2020-01-12 DIAGNOSIS — D631 Anemia in chronic kidney disease: Secondary | ICD-10-CM | POA: Diagnosis not present

## 2020-01-12 DIAGNOSIS — N186 End stage renal disease: Secondary | ICD-10-CM | POA: Diagnosis not present

## 2020-01-12 DIAGNOSIS — Z992 Dependence on renal dialysis: Secondary | ICD-10-CM | POA: Diagnosis not present

## 2020-01-13 DIAGNOSIS — S81812D Laceration without foreign body, left lower leg, subsequent encounter: Secondary | ICD-10-CM | POA: Diagnosis not present

## 2020-01-13 DIAGNOSIS — N186 End stage renal disease: Secondary | ICD-10-CM | POA: Diagnosis not present

## 2020-01-13 DIAGNOSIS — I120 Hypertensive chronic kidney disease with stage 5 chronic kidney disease or end stage renal disease: Secondary | ICD-10-CM | POA: Diagnosis not present

## 2020-01-13 DIAGNOSIS — E1122 Type 2 diabetes mellitus with diabetic chronic kidney disease: Secondary | ICD-10-CM | POA: Diagnosis not present

## 2020-01-13 DIAGNOSIS — E11621 Type 2 diabetes mellitus with foot ulcer: Secondary | ICD-10-CM | POA: Diagnosis not present

## 2020-01-13 DIAGNOSIS — L97421 Non-pressure chronic ulcer of left heel and midfoot limited to breakdown of skin: Secondary | ICD-10-CM | POA: Diagnosis not present

## 2020-01-14 DIAGNOSIS — Z7902 Long term (current) use of antithrombotics/antiplatelets: Secondary | ICD-10-CM | POA: Diagnosis not present

## 2020-01-14 DIAGNOSIS — E039 Hypothyroidism, unspecified: Secondary | ICD-10-CM | POA: Diagnosis not present

## 2020-01-14 DIAGNOSIS — N186 End stage renal disease: Secondary | ICD-10-CM | POA: Diagnosis not present

## 2020-01-14 DIAGNOSIS — Z992 Dependence on renal dialysis: Secondary | ICD-10-CM | POA: Diagnosis not present

## 2020-01-14 DIAGNOSIS — F329 Major depressive disorder, single episode, unspecified: Secondary | ICD-10-CM | POA: Diagnosis not present

## 2020-01-14 DIAGNOSIS — E785 Hyperlipidemia, unspecified: Secondary | ICD-10-CM | POA: Diagnosis not present

## 2020-01-14 DIAGNOSIS — N2581 Secondary hyperparathyroidism of renal origin: Secondary | ICD-10-CM | POA: Diagnosis not present

## 2020-01-14 DIAGNOSIS — Z9181 History of falling: Secondary | ICD-10-CM | POA: Diagnosis not present

## 2020-01-14 DIAGNOSIS — I120 Hypertensive chronic kidney disease with stage 5 chronic kidney disease or end stage renal disease: Secondary | ICD-10-CM | POA: Diagnosis not present

## 2020-01-14 DIAGNOSIS — E1122 Type 2 diabetes mellitus with diabetic chronic kidney disease: Secondary | ICD-10-CM | POA: Diagnosis not present

## 2020-01-14 DIAGNOSIS — S81812D Laceration without foreign body, left lower leg, subsequent encounter: Secondary | ICD-10-CM | POA: Diagnosis not present

## 2020-01-14 DIAGNOSIS — M1991 Primary osteoarthritis, unspecified site: Secondary | ICD-10-CM | POA: Diagnosis not present

## 2020-01-14 DIAGNOSIS — Z89611 Acquired absence of right leg above knee: Secondary | ICD-10-CM | POA: Diagnosis not present

## 2020-01-14 DIAGNOSIS — D509 Iron deficiency anemia, unspecified: Secondary | ICD-10-CM | POA: Diagnosis not present

## 2020-01-14 DIAGNOSIS — E1142 Type 2 diabetes mellitus with diabetic polyneuropathy: Secondary | ICD-10-CM | POA: Diagnosis not present

## 2020-01-14 DIAGNOSIS — Z794 Long term (current) use of insulin: Secondary | ICD-10-CM | POA: Diagnosis not present

## 2020-01-14 DIAGNOSIS — L97421 Non-pressure chronic ulcer of left heel and midfoot limited to breakdown of skin: Secondary | ICD-10-CM | POA: Diagnosis not present

## 2020-01-14 DIAGNOSIS — E1151 Type 2 diabetes mellitus with diabetic peripheral angiopathy without gangrene: Secondary | ICD-10-CM | POA: Diagnosis not present

## 2020-01-14 DIAGNOSIS — F419 Anxiety disorder, unspecified: Secondary | ICD-10-CM | POA: Diagnosis not present

## 2020-01-14 DIAGNOSIS — E11621 Type 2 diabetes mellitus with foot ulcer: Secondary | ICD-10-CM | POA: Diagnosis not present

## 2020-01-14 DIAGNOSIS — J449 Chronic obstructive pulmonary disease, unspecified: Secondary | ICD-10-CM | POA: Diagnosis not present

## 2020-01-14 DIAGNOSIS — D631 Anemia in chronic kidney disease: Secondary | ICD-10-CM | POA: Diagnosis not present

## 2020-01-14 DIAGNOSIS — K219 Gastro-esophageal reflux disease without esophagitis: Secondary | ICD-10-CM | POA: Diagnosis not present

## 2020-01-14 DIAGNOSIS — F17210 Nicotine dependence, cigarettes, uncomplicated: Secondary | ICD-10-CM | POA: Diagnosis not present

## 2020-01-14 DIAGNOSIS — G43909 Migraine, unspecified, not intractable, without status migrainosus: Secondary | ICD-10-CM | POA: Diagnosis not present

## 2020-01-16 DIAGNOSIS — D631 Anemia in chronic kidney disease: Secondary | ICD-10-CM | POA: Diagnosis not present

## 2020-01-16 DIAGNOSIS — D509 Iron deficiency anemia, unspecified: Secondary | ICD-10-CM | POA: Diagnosis not present

## 2020-01-16 DIAGNOSIS — Z992 Dependence on renal dialysis: Secondary | ICD-10-CM | POA: Diagnosis not present

## 2020-01-16 DIAGNOSIS — N2581 Secondary hyperparathyroidism of renal origin: Secondary | ICD-10-CM | POA: Diagnosis not present

## 2020-01-16 DIAGNOSIS — N186 End stage renal disease: Secondary | ICD-10-CM | POA: Diagnosis not present

## 2020-01-19 DIAGNOSIS — Z992 Dependence on renal dialysis: Secondary | ICD-10-CM | POA: Diagnosis not present

## 2020-01-19 DIAGNOSIS — N186 End stage renal disease: Secondary | ICD-10-CM | POA: Diagnosis not present

## 2020-01-19 DIAGNOSIS — N2581 Secondary hyperparathyroidism of renal origin: Secondary | ICD-10-CM | POA: Diagnosis not present

## 2020-01-19 DIAGNOSIS — D509 Iron deficiency anemia, unspecified: Secondary | ICD-10-CM | POA: Diagnosis not present

## 2020-01-19 DIAGNOSIS — D631 Anemia in chronic kidney disease: Secondary | ICD-10-CM | POA: Diagnosis not present

## 2020-01-20 ENCOUNTER — Other Ambulatory Visit (HOSPITAL_COMMUNITY): Payer: Medicare Other

## 2020-01-20 DIAGNOSIS — E1122 Type 2 diabetes mellitus with diabetic chronic kidney disease: Secondary | ICD-10-CM | POA: Diagnosis not present

## 2020-01-20 DIAGNOSIS — S81812D Laceration without foreign body, left lower leg, subsequent encounter: Secondary | ICD-10-CM | POA: Diagnosis not present

## 2020-01-20 DIAGNOSIS — I120 Hypertensive chronic kidney disease with stage 5 chronic kidney disease or end stage renal disease: Secondary | ICD-10-CM | POA: Diagnosis not present

## 2020-01-20 DIAGNOSIS — L97421 Non-pressure chronic ulcer of left heel and midfoot limited to breakdown of skin: Secondary | ICD-10-CM | POA: Diagnosis not present

## 2020-01-20 DIAGNOSIS — E11621 Type 2 diabetes mellitus with foot ulcer: Secondary | ICD-10-CM | POA: Diagnosis not present

## 2020-01-20 DIAGNOSIS — N186 End stage renal disease: Secondary | ICD-10-CM | POA: Diagnosis not present

## 2020-01-21 DIAGNOSIS — D509 Iron deficiency anemia, unspecified: Secondary | ICD-10-CM | POA: Diagnosis not present

## 2020-01-21 DIAGNOSIS — Z992 Dependence on renal dialysis: Secondary | ICD-10-CM | POA: Diagnosis not present

## 2020-01-21 DIAGNOSIS — N186 End stage renal disease: Secondary | ICD-10-CM | POA: Diagnosis not present

## 2020-01-21 DIAGNOSIS — N2581 Secondary hyperparathyroidism of renal origin: Secondary | ICD-10-CM | POA: Diagnosis not present

## 2020-01-21 DIAGNOSIS — D631 Anemia in chronic kidney disease: Secondary | ICD-10-CM | POA: Diagnosis not present

## 2020-01-23 DIAGNOSIS — Z992 Dependence on renal dialysis: Secondary | ICD-10-CM | POA: Diagnosis not present

## 2020-01-23 DIAGNOSIS — N2581 Secondary hyperparathyroidism of renal origin: Secondary | ICD-10-CM | POA: Diagnosis not present

## 2020-01-23 DIAGNOSIS — D631 Anemia in chronic kidney disease: Secondary | ICD-10-CM | POA: Diagnosis not present

## 2020-01-23 DIAGNOSIS — N186 End stage renal disease: Secondary | ICD-10-CM | POA: Diagnosis not present

## 2020-01-23 DIAGNOSIS — D509 Iron deficiency anemia, unspecified: Secondary | ICD-10-CM | POA: Diagnosis not present

## 2020-01-26 ENCOUNTER — Other Ambulatory Visit: Payer: Self-pay | Admitting: *Deleted

## 2020-01-26 DIAGNOSIS — N2581 Secondary hyperparathyroidism of renal origin: Secondary | ICD-10-CM | POA: Diagnosis not present

## 2020-01-26 DIAGNOSIS — D631 Anemia in chronic kidney disease: Secondary | ICD-10-CM | POA: Diagnosis not present

## 2020-01-26 DIAGNOSIS — D509 Iron deficiency anemia, unspecified: Secondary | ICD-10-CM | POA: Diagnosis not present

## 2020-01-26 DIAGNOSIS — N186 End stage renal disease: Secondary | ICD-10-CM | POA: Diagnosis not present

## 2020-01-26 DIAGNOSIS — Z992 Dependence on renal dialysis: Secondary | ICD-10-CM | POA: Diagnosis not present

## 2020-01-26 DIAGNOSIS — I739 Peripheral vascular disease, unspecified: Secondary | ICD-10-CM

## 2020-01-27 ENCOUNTER — Other Ambulatory Visit: Payer: Self-pay | Admitting: *Deleted

## 2020-01-27 DIAGNOSIS — E1122 Type 2 diabetes mellitus with diabetic chronic kidney disease: Secondary | ICD-10-CM | POA: Diagnosis not present

## 2020-01-27 DIAGNOSIS — N186 End stage renal disease: Secondary | ICD-10-CM | POA: Diagnosis not present

## 2020-01-27 DIAGNOSIS — I120 Hypertensive chronic kidney disease with stage 5 chronic kidney disease or end stage renal disease: Secondary | ICD-10-CM | POA: Diagnosis not present

## 2020-01-27 DIAGNOSIS — S81812D Laceration without foreign body, left lower leg, subsequent encounter: Secondary | ICD-10-CM | POA: Diagnosis not present

## 2020-01-27 DIAGNOSIS — E11621 Type 2 diabetes mellitus with foot ulcer: Secondary | ICD-10-CM | POA: Diagnosis not present

## 2020-01-27 DIAGNOSIS — L97421 Non-pressure chronic ulcer of left heel and midfoot limited to breakdown of skin: Secondary | ICD-10-CM | POA: Diagnosis not present

## 2020-01-27 NOTE — Patient Outreach (Signed)
[facility]) Care Management  01/27/2020  [name] 30-Mar-1951 [id number]   [place] Monthly Outreach  Referral Date: 06/26/2019 Referral Source: Transfer from [place] Reason for Referral: Continued Disease Management Education Insurance: Medicare   Outreach Attempt:  Outreach attempt #4 to patient for follow up.  Male answered and stated patient was working with his nurse at the time.  RN Health Coach left HIPAA compliant message with male answering, requesting telephone call back.  Plan:  RN Health Coach will make another outreach attempt within the month of July if no return call back from patient.   [place] Coach [phone] [name]

## 2020-01-28 DIAGNOSIS — N2581 Secondary hyperparathyroidism of renal origin: Secondary | ICD-10-CM | POA: Diagnosis not present

## 2020-01-28 DIAGNOSIS — N186 End stage renal disease: Secondary | ICD-10-CM | POA: Diagnosis not present

## 2020-01-28 DIAGNOSIS — D631 Anemia in chronic kidney disease: Secondary | ICD-10-CM | POA: Diagnosis not present

## 2020-01-28 DIAGNOSIS — Z992 Dependence on renal dialysis: Secondary | ICD-10-CM | POA: Diagnosis not present

## 2020-01-28 DIAGNOSIS — D509 Iron deficiency anemia, unspecified: Secondary | ICD-10-CM | POA: Diagnosis not present

## 2020-01-30 DIAGNOSIS — D509 Iron deficiency anemia, unspecified: Secondary | ICD-10-CM | POA: Diagnosis not present

## 2020-01-30 DIAGNOSIS — N186 End stage renal disease: Secondary | ICD-10-CM | POA: Diagnosis not present

## 2020-01-30 DIAGNOSIS — D631 Anemia in chronic kidney disease: Secondary | ICD-10-CM | POA: Diagnosis not present

## 2020-01-30 DIAGNOSIS — Z992 Dependence on renal dialysis: Secondary | ICD-10-CM | POA: Diagnosis not present

## 2020-01-30 DIAGNOSIS — N2581 Secondary hyperparathyroidism of renal origin: Secondary | ICD-10-CM | POA: Diagnosis not present

## 2020-02-02 ENCOUNTER — Other Ambulatory Visit: Payer: Self-pay

## 2020-02-02 ENCOUNTER — Ambulatory Visit (INDEPENDENT_AMBULATORY_CARE_PROVIDER_SITE_OTHER): Payer: Medicare Other

## 2020-02-02 DIAGNOSIS — D631 Anemia in chronic kidney disease: Secondary | ICD-10-CM | POA: Diagnosis not present

## 2020-02-02 DIAGNOSIS — I493 Ventricular premature depolarization: Secondary | ICD-10-CM

## 2020-02-02 DIAGNOSIS — R011 Cardiac murmur, unspecified: Secondary | ICD-10-CM | POA: Diagnosis not present

## 2020-02-02 DIAGNOSIS — N186 End stage renal disease: Secondary | ICD-10-CM | POA: Diagnosis not present

## 2020-02-02 DIAGNOSIS — Z992 Dependence on renal dialysis: Secondary | ICD-10-CM | POA: Diagnosis not present

## 2020-02-02 DIAGNOSIS — D509 Iron deficiency anemia, unspecified: Secondary | ICD-10-CM | POA: Diagnosis not present

## 2020-02-02 DIAGNOSIS — N2581 Secondary hyperparathyroidism of renal origin: Secondary | ICD-10-CM | POA: Diagnosis not present

## 2020-02-03 ENCOUNTER — Other Ambulatory Visit: Payer: Medicare Other

## 2020-02-03 DIAGNOSIS — N186 End stage renal disease: Secondary | ICD-10-CM | POA: Diagnosis not present

## 2020-02-03 DIAGNOSIS — I120 Hypertensive chronic kidney disease with stage 5 chronic kidney disease or end stage renal disease: Secondary | ICD-10-CM | POA: Diagnosis not present

## 2020-02-03 DIAGNOSIS — E1122 Type 2 diabetes mellitus with diabetic chronic kidney disease: Secondary | ICD-10-CM | POA: Diagnosis not present

## 2020-02-03 DIAGNOSIS — E11621 Type 2 diabetes mellitus with foot ulcer: Secondary | ICD-10-CM | POA: Diagnosis not present

## 2020-02-03 DIAGNOSIS — S81812D Laceration without foreign body, left lower leg, subsequent encounter: Secondary | ICD-10-CM | POA: Diagnosis not present

## 2020-02-03 DIAGNOSIS — L97421 Non-pressure chronic ulcer of left heel and midfoot limited to breakdown of skin: Secondary | ICD-10-CM | POA: Diagnosis not present

## 2020-02-04 ENCOUNTER — Ambulatory Visit (HOSPITAL_COMMUNITY)
Admission: RE | Admit: 2020-02-04 | Discharge: 2020-02-04 | Disposition: A | Discharge status: Home / Self Care | Payer: Medicare Other | Source: Ambulatory Visit | Attending: Vascular Surgery | Admitting: Vascular Surgery

## 2020-02-04 ENCOUNTER — Other Ambulatory Visit: Payer: Self-pay

## 2020-02-04 ENCOUNTER — Encounter: Payer: Self-pay | Admitting: Vascular Surgery

## 2020-02-04 ENCOUNTER — Ambulatory Visit (INDEPENDENT_AMBULATORY_CARE_PROVIDER_SITE_OTHER): Payer: Medicare Other | Admitting: Vascular Surgery

## 2020-02-04 VITALS — BP 118/68 | HR 70 | Temp 97.4°F | Resp 20 | Ht 76.0 in | Wt 247.0 lb

## 2020-02-04 DIAGNOSIS — I739 Peripheral vascular disease, unspecified: Secondary | ICD-10-CM | POA: Diagnosis not present

## 2020-02-04 DIAGNOSIS — D509 Iron deficiency anemia, unspecified: Secondary | ICD-10-CM | POA: Diagnosis not present

## 2020-02-04 DIAGNOSIS — D631 Anemia in chronic kidney disease: Secondary | ICD-10-CM | POA: Diagnosis not present

## 2020-02-04 DIAGNOSIS — N2581 Secondary hyperparathyroidism of renal origin: Secondary | ICD-10-CM | POA: Diagnosis not present

## 2020-02-04 DIAGNOSIS — Z992 Dependence on renal dialysis: Secondary | ICD-10-CM | POA: Diagnosis not present

## 2020-02-04 DIAGNOSIS — N186 End stage renal disease: Secondary | ICD-10-CM | POA: Diagnosis not present

## 2020-02-04 NOTE — Progress Notes (Signed)
Patient is a 69 year old male who returns for follow-up today regarding peripheral arterial disease and nonhealing wounds on his left foot. His last arteriogram was in July 2018. This was done by Dr. Gwenlyn Found. He had atherectomy of his left superficial femoral artery and popliteal artery. This was complicated by right groin hematoma which required evacuation. 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 He still has problems with muscle spasms in the right leg. His amputation was in September 2020. He takes Lyrica which she thinks may help a little bit. He currently has several wounds on his left foot which are being taken care of by home health nurse.  It last measured 2 x 3 cm 1 mm depth when I saw him in March 2021.  Other chronic medical problems include COPD diabetes hypertension neuropathy all of which are currently stable.  Past Medical History:  Diagnosis Date  . Anemia   . Anxiety   . Arthritis   . Chronic kidney disease    Dialysis T/Th/Sa  . COPD (chronic obstructive pulmonary disease) ([place])    Pt denies  . Depression   . Diabetes mellitus without complication ([place])   . GERD (gastroesophageal reflux disease)   . H/O hiatal hernia   . Headache(784.0)    Hx: Migraines  . History of kidney stones   . Hypertension   . Neuropathy   . Non-healing wound of amputation stump ([place])    right  . Numbness of toes    toes and feet  . Pneumonia    Hx: of several times - pt denies (04/24/19)  . Renal insufficiency   . Shortness of breath dyspnea   . Type 2 diabetes mellitus ([place])      Review of systems: He has shortness of breath with exertion.  He is on hemodialysis is due Thursday Saturday.  He does not describe chest pain.  Current Outpatient Medications on File Prior to Visit  Medication Sig Dispense Refill  . albuterol (VENTOLIN HFA) 108 (90 Base) MCG/ACT inhaler Inhale 1-2 puffs into the lungs every 6 (six) hours as needed for wheezing or shortness of breath.    .  ARTIFICIAL TEAR OP Place 1 drop into both eyes every 6 (six) hours as needed (dry eyes).    Marland Kitchen atorvastatin (LIPITOR) 80 MG tablet Take 1 tablet (80 mg total) by mouth at bedtime. (Patient taking differently: Take 80 mg by mouth daily. ) 30 tablet 5  . bismuth subsalicylate (PEPTO BISMOL) 262 MG/15ML suspension Take 30 mLs by mouth every 6 (six) hours as needed for indigestion.    . calcium acetate (PHOSLO) 667 MG capsule Take 4 capsules (2,668 mg total) by mouth 3 (three) times daily with meals. (Patient taking differently: Take [phone] mg by mouth See admin instructions. Take 2668 mg with meals and 1334 with each snack) 360 capsule 0  . cinacalcet (SENSIPAR) 30 MG tablet Take 1 tablet (30 mg total) by mouth every evening. 60 tablet 0  . clopidogrel (PLAVIX) 75 MG tablet Take 1 tablet (75 mg total) by mouth daily. 30 tablet 0  . diphenhydrAMINE (BENADRYL) 25 MG tablet Take 25 mg by mouth daily as needed for itching.    Marland Kitchen ibuprofen (ADVIL) 200 MG tablet Take 800 mg by mouth 3 (three) times daily as needed for headache or moderate pain.    Marland Kitchen levothyroxine (SYNTHROID) 125 MCG tablet Take 1 tablet (125 mcg total) by mouth daily before breakfast. (Patient taking differently: Take 125 mcg by mouth at bedtime. )  30 tablet 0  . lisinopril (ZESTRIL) 10 MG tablet Take 10 mg by mouth daily.    . Melatonin 10 MG CAPS Take 10 mg by mouth at bedtime.    . multivitamin (RENA-VIT) TABS tablet Take 1 tablet by mouth daily.    . polyethylene glycol (MIRALAX / GLYCOLAX) 17 g packet Take 17 g by mouth daily as needed for moderate constipation.    . pregabalin (LYRICA) 150 MG capsule Take 1 capsule (150 mg total) by mouth 3 (three) times daily. 90 capsule 0  . sevelamer carbonate (RENVELA) 800 MG tablet Take 1,600 mg by mouth 3 (three) times daily.    [name] 300 UNIT/ML SOPN Inject 54-84 Units as directed at bedtime as needed (if blood sugar is 140 or higher).      No current facility-administered  medications on file prior to visit.    Physical exam:  Vitals:   02/04/20 1445  BP: 118/68  Pulse: 70  Resp: 20  Temp: (!) 97.4 F (36.3 C)  SpO2: 96%  Weight: 247 lb (112 kg)  Height: 6\' 4"  (1.93 m)    Left lower extremity: Ulcer left lateral foot 10 x 10 mm diameter 1 mm depth left heel 12 x 8 mm diameter 1 mm depth  Right lower extremity well-healed AKA no fluctuance mildly tender on the medial aspect  Data: Patient had a duplex ultrasound today which showed his left superficial femoral artery is occluded with reconstitution of the popliteal artery monophasic tibial flow in the anterior and posterior tibial arteries  Assessment: Slowly healing wound left foot patient would prefer not to have a repeat arteriogram or revascularization at this point as he does not want to go back in the hospital.  The wound is about 50% smaller on the heel and it was 3 months ago.  Unfortunately he has a new lateral foot wound.  His perfusion to his foot is marginal but he does seem to be healing slowly.  We discussed the possibility of an arteriogram now and possible revascularization.  He would prefer to defer this a few more months to see if the wound will heal.  Plan: Patient will follow up with me in 3 months time with repeat duplex of his left lower extremity  [name], MD [facility]: [phone]

## 2020-02-06 DIAGNOSIS — N2581 Secondary hyperparathyroidism of renal origin: Secondary | ICD-10-CM | POA: Diagnosis not present

## 2020-02-06 DIAGNOSIS — D509 Iron deficiency anemia, unspecified: Secondary | ICD-10-CM | POA: Diagnosis not present

## 2020-02-06 DIAGNOSIS — N186 End stage renal disease: Secondary | ICD-10-CM | POA: Diagnosis not present

## 2020-02-06 DIAGNOSIS — Z992 Dependence on renal dialysis: Secondary | ICD-10-CM | POA: Diagnosis not present

## 2020-02-06 DIAGNOSIS — D631 Anemia in chronic kidney disease: Secondary | ICD-10-CM | POA: Diagnosis not present

## 2020-02-08 ENCOUNTER — Other Ambulatory Visit: Payer: Self-pay | Admitting: *Deleted

## 2020-02-08 DIAGNOSIS — I739 Peripheral vascular disease, unspecified: Secondary | ICD-10-CM

## 2020-02-09 ENCOUNTER — Telehealth: Payer: Self-pay | Admitting: *Deleted

## 2020-02-09 DIAGNOSIS — N2581 Secondary hyperparathyroidism of renal origin: Secondary | ICD-10-CM | POA: Diagnosis not present

## 2020-02-09 DIAGNOSIS — D509 Iron deficiency anemia, unspecified: Secondary | ICD-10-CM | POA: Diagnosis not present

## 2020-02-09 DIAGNOSIS — Z992 Dependence on renal dialysis: Secondary | ICD-10-CM | POA: Diagnosis not present

## 2020-02-09 DIAGNOSIS — N186 End stage renal disease: Secondary | ICD-10-CM | POA: Diagnosis not present

## 2020-02-09 DIAGNOSIS — D631 Anemia in chronic kidney disease: Secondary | ICD-10-CM | POA: Diagnosis not present

## 2020-02-09 NOTE — Telephone Encounter (Signed)
-----   Message from Herminio Commons, MD sent at 02/08/2020 12:21 PM EDT ----- Normal pumping function with mild aortic valve narrowing. I will monitor.

## 2020-02-09 NOTE — Telephone Encounter (Signed)
Laurine Blazer, LPN  04/29/6059 0:45 PM EDT Back to Top    Notified, copy to pcp.    Laurine Blazer, LPN  9/97/7414 23:95 AM EDT     Left message to return call.

## 2020-02-10 DIAGNOSIS — N186 End stage renal disease: Secondary | ICD-10-CM | POA: Diagnosis not present

## 2020-02-10 DIAGNOSIS — I120 Hypertensive chronic kidney disease with stage 5 chronic kidney disease or end stage renal disease: Secondary | ICD-10-CM | POA: Diagnosis not present

## 2020-02-10 DIAGNOSIS — L97421 Non-pressure chronic ulcer of left heel and midfoot limited to breakdown of skin: Secondary | ICD-10-CM | POA: Diagnosis not present

## 2020-02-10 DIAGNOSIS — S81812D Laceration without foreign body, left lower leg, subsequent encounter: Secondary | ICD-10-CM | POA: Diagnosis not present

## 2020-02-10 DIAGNOSIS — E1122 Type 2 diabetes mellitus with diabetic chronic kidney disease: Secondary | ICD-10-CM | POA: Diagnosis not present

## 2020-02-10 DIAGNOSIS — E11621 Type 2 diabetes mellitus with foot ulcer: Secondary | ICD-10-CM | POA: Diagnosis not present

## 2020-02-11 DIAGNOSIS — D631 Anemia in chronic kidney disease: Secondary | ICD-10-CM | POA: Diagnosis not present

## 2020-02-11 DIAGNOSIS — N186 End stage renal disease: Secondary | ICD-10-CM | POA: Diagnosis not present

## 2020-02-11 DIAGNOSIS — D509 Iron deficiency anemia, unspecified: Secondary | ICD-10-CM | POA: Diagnosis not present

## 2020-02-11 DIAGNOSIS — Z992 Dependence on renal dialysis: Secondary | ICD-10-CM | POA: Diagnosis not present

## 2020-02-11 DIAGNOSIS — N2581 Secondary hyperparathyroidism of renal origin: Secondary | ICD-10-CM | POA: Diagnosis not present

## 2020-02-13 DIAGNOSIS — F329 Major depressive disorder, single episode, unspecified: Secondary | ICD-10-CM | POA: Diagnosis not present

## 2020-02-13 DIAGNOSIS — J449 Chronic obstructive pulmonary disease, unspecified: Secondary | ICD-10-CM | POA: Diagnosis not present

## 2020-02-13 DIAGNOSIS — Z992 Dependence on renal dialysis: Secondary | ICD-10-CM | POA: Diagnosis not present

## 2020-02-13 DIAGNOSIS — E1151 Type 2 diabetes mellitus with diabetic peripheral angiopathy without gangrene: Secondary | ICD-10-CM | POA: Diagnosis not present

## 2020-02-13 DIAGNOSIS — E11621 Type 2 diabetes mellitus with foot ulcer: Secondary | ICD-10-CM | POA: Diagnosis not present

## 2020-02-13 DIAGNOSIS — N186 End stage renal disease: Secondary | ICD-10-CM | POA: Diagnosis not present

## 2020-02-13 DIAGNOSIS — S81812D Laceration without foreign body, left lower leg, subsequent encounter: Secondary | ICD-10-CM | POA: Diagnosis not present

## 2020-02-13 DIAGNOSIS — E785 Hyperlipidemia, unspecified: Secondary | ICD-10-CM | POA: Diagnosis not present

## 2020-02-13 DIAGNOSIS — N2581 Secondary hyperparathyroidism of renal origin: Secondary | ICD-10-CM | POA: Diagnosis not present

## 2020-02-13 DIAGNOSIS — F17210 Nicotine dependence, cigarettes, uncomplicated: Secondary | ICD-10-CM | POA: Diagnosis not present

## 2020-02-13 DIAGNOSIS — D509 Iron deficiency anemia, unspecified: Secondary | ICD-10-CM | POA: Diagnosis not present

## 2020-02-13 DIAGNOSIS — D631 Anemia in chronic kidney disease: Secondary | ICD-10-CM | POA: Diagnosis not present

## 2020-02-13 DIAGNOSIS — L97421 Non-pressure chronic ulcer of left heel and midfoot limited to breakdown of skin: Secondary | ICD-10-CM | POA: Diagnosis not present

## 2020-02-13 DIAGNOSIS — E039 Hypothyroidism, unspecified: Secondary | ICD-10-CM | POA: Diagnosis not present

## 2020-02-13 DIAGNOSIS — F419 Anxiety disorder, unspecified: Secondary | ICD-10-CM | POA: Diagnosis not present

## 2020-02-13 DIAGNOSIS — S81802D Unspecified open wound, left lower leg, subsequent encounter: Secondary | ICD-10-CM | POA: Diagnosis not present

## 2020-02-13 DIAGNOSIS — K219 Gastro-esophageal reflux disease without esophagitis: Secondary | ICD-10-CM | POA: Diagnosis not present

## 2020-02-13 DIAGNOSIS — E1122 Type 2 diabetes mellitus with diabetic chronic kidney disease: Secondary | ICD-10-CM | POA: Diagnosis not present

## 2020-02-13 DIAGNOSIS — Z9181 History of falling: Secondary | ICD-10-CM | POA: Diagnosis not present

## 2020-02-13 DIAGNOSIS — I120 Hypertensive chronic kidney disease with stage 5 chronic kidney disease or end stage renal disease: Secondary | ICD-10-CM | POA: Diagnosis not present

## 2020-02-13 DIAGNOSIS — Z89611 Acquired absence of right leg above knee: Secondary | ICD-10-CM | POA: Diagnosis not present

## 2020-02-13 DIAGNOSIS — M1991 Primary osteoarthritis, unspecified site: Secondary | ICD-10-CM | POA: Diagnosis not present

## 2020-02-13 DIAGNOSIS — E1142 Type 2 diabetes mellitus with diabetic polyneuropathy: Secondary | ICD-10-CM | POA: Diagnosis not present

## 2020-02-13 DIAGNOSIS — Z794 Long term (current) use of insulin: Secondary | ICD-10-CM | POA: Diagnosis not present

## 2020-02-13 DIAGNOSIS — Z7902 Long term (current) use of antithrombotics/antiplatelets: Secondary | ICD-10-CM | POA: Diagnosis not present

## 2020-02-13 DIAGNOSIS — G43909 Migraine, unspecified, not intractable, without status migrainosus: Secondary | ICD-10-CM | POA: Diagnosis not present

## 2020-02-16 DIAGNOSIS — D509 Iron deficiency anemia, unspecified: Secondary | ICD-10-CM | POA: Diagnosis not present

## 2020-02-16 DIAGNOSIS — N2581 Secondary hyperparathyroidism of renal origin: Secondary | ICD-10-CM | POA: Diagnosis not present

## 2020-02-16 DIAGNOSIS — D631 Anemia in chronic kidney disease: Secondary | ICD-10-CM | POA: Diagnosis not present

## 2020-02-16 DIAGNOSIS — Z992 Dependence on renal dialysis: Secondary | ICD-10-CM | POA: Diagnosis not present

## 2020-02-16 DIAGNOSIS — N186 End stage renal disease: Secondary | ICD-10-CM | POA: Diagnosis not present

## 2020-02-17 DIAGNOSIS — S81812D Laceration without foreign body, left lower leg, subsequent encounter: Secondary | ICD-10-CM | POA: Diagnosis not present

## 2020-02-17 DIAGNOSIS — L97421 Non-pressure chronic ulcer of left heel and midfoot limited to breakdown of skin: Secondary | ICD-10-CM | POA: Diagnosis not present

## 2020-02-17 DIAGNOSIS — I120 Hypertensive chronic kidney disease with stage 5 chronic kidney disease or end stage renal disease: Secondary | ICD-10-CM | POA: Diagnosis not present

## 2020-02-17 DIAGNOSIS — E11621 Type 2 diabetes mellitus with foot ulcer: Secondary | ICD-10-CM | POA: Diagnosis not present

## 2020-02-17 DIAGNOSIS — E1122 Type 2 diabetes mellitus with diabetic chronic kidney disease: Secondary | ICD-10-CM | POA: Diagnosis not present

## 2020-02-17 DIAGNOSIS — S81802D Unspecified open wound, left lower leg, subsequent encounter: Secondary | ICD-10-CM | POA: Diagnosis not present

## 2020-02-18 DIAGNOSIS — N2581 Secondary hyperparathyroidism of renal origin: Secondary | ICD-10-CM | POA: Diagnosis not present

## 2020-02-18 DIAGNOSIS — N186 End stage renal disease: Secondary | ICD-10-CM | POA: Diagnosis not present

## 2020-02-18 DIAGNOSIS — D631 Anemia in chronic kidney disease: Secondary | ICD-10-CM | POA: Diagnosis not present

## 2020-02-18 DIAGNOSIS — Z992 Dependence on renal dialysis: Secondary | ICD-10-CM | POA: Diagnosis not present

## 2020-02-18 DIAGNOSIS — D509 Iron deficiency anemia, unspecified: Secondary | ICD-10-CM | POA: Diagnosis not present

## 2020-02-20 DIAGNOSIS — N186 End stage renal disease: Secondary | ICD-10-CM | POA: Diagnosis not present

## 2020-02-20 DIAGNOSIS — Z992 Dependence on renal dialysis: Secondary | ICD-10-CM | POA: Diagnosis not present

## 2020-02-20 DIAGNOSIS — D509 Iron deficiency anemia, unspecified: Secondary | ICD-10-CM | POA: Diagnosis not present

## 2020-02-20 DIAGNOSIS — N2581 Secondary hyperparathyroidism of renal origin: Secondary | ICD-10-CM | POA: Diagnosis not present

## 2020-02-20 DIAGNOSIS — D631 Anemia in chronic kidney disease: Secondary | ICD-10-CM | POA: Diagnosis not present

## 2020-02-23 DIAGNOSIS — D631 Anemia in chronic kidney disease: Secondary | ICD-10-CM | POA: Diagnosis not present

## 2020-02-23 DIAGNOSIS — Z992 Dependence on renal dialysis: Secondary | ICD-10-CM | POA: Diagnosis not present

## 2020-02-23 DIAGNOSIS — N186 End stage renal disease: Secondary | ICD-10-CM | POA: Diagnosis not present

## 2020-02-23 DIAGNOSIS — N2581 Secondary hyperparathyroidism of renal origin: Secondary | ICD-10-CM | POA: Diagnosis not present

## 2020-02-23 DIAGNOSIS — D509 Iron deficiency anemia, unspecified: Secondary | ICD-10-CM | POA: Diagnosis not present

## 2020-02-24 DIAGNOSIS — E11621 Type 2 diabetes mellitus with foot ulcer: Secondary | ICD-10-CM | POA: Diagnosis not present

## 2020-02-24 DIAGNOSIS — L97421 Non-pressure chronic ulcer of left heel and midfoot limited to breakdown of skin: Secondary | ICD-10-CM | POA: Diagnosis not present

## 2020-02-24 DIAGNOSIS — E1122 Type 2 diabetes mellitus with diabetic chronic kidney disease: Secondary | ICD-10-CM | POA: Diagnosis not present

## 2020-02-24 DIAGNOSIS — I120 Hypertensive chronic kidney disease with stage 5 chronic kidney disease or end stage renal disease: Secondary | ICD-10-CM | POA: Diagnosis not present

## 2020-02-24 DIAGNOSIS — S81802D Unspecified open wound, left lower leg, subsequent encounter: Secondary | ICD-10-CM | POA: Diagnosis not present

## 2020-02-24 DIAGNOSIS — S81812D Laceration without foreign body, left lower leg, subsequent encounter: Secondary | ICD-10-CM | POA: Diagnosis not present

## 2020-02-25 DIAGNOSIS — N186 End stage renal disease: Secondary | ICD-10-CM | POA: Diagnosis not present

## 2020-02-25 DIAGNOSIS — Z992 Dependence on renal dialysis: Secondary | ICD-10-CM | POA: Diagnosis not present

## 2020-02-25 DIAGNOSIS — D631 Anemia in chronic kidney disease: Secondary | ICD-10-CM | POA: Diagnosis not present

## 2020-02-25 DIAGNOSIS — N2581 Secondary hyperparathyroidism of renal origin: Secondary | ICD-10-CM | POA: Diagnosis not present

## 2020-02-25 DIAGNOSIS — D509 Iron deficiency anemia, unspecified: Secondary | ICD-10-CM | POA: Diagnosis not present

## 2020-02-27 DIAGNOSIS — N2581 Secondary hyperparathyroidism of renal origin: Secondary | ICD-10-CM | POA: Diagnosis not present

## 2020-02-27 DIAGNOSIS — Z992 Dependence on renal dialysis: Secondary | ICD-10-CM | POA: Diagnosis not present

## 2020-02-27 DIAGNOSIS — N186 End stage renal disease: Secondary | ICD-10-CM | POA: Diagnosis not present

## 2020-02-27 DIAGNOSIS — D631 Anemia in chronic kidney disease: Secondary | ICD-10-CM | POA: Diagnosis not present

## 2020-02-27 DIAGNOSIS — D509 Iron deficiency anemia, unspecified: Secondary | ICD-10-CM | POA: Diagnosis not present

## 2020-02-29 ENCOUNTER — Other Ambulatory Visit: Payer: Self-pay | Admitting: *Deleted

## 2020-02-29 NOTE — Patient Outreach (Signed)
[facility]) Care Management  02/29/2020  [name] [birth year] [id number]   [place] Monthly Outreach  Referral Date: 06/26/2019 Referral Source: Transfer from [place] Reason for Referral: Continued Disease Management Education Insurance: Medicare   Outreach Attempt:  Outreach attempt #5 to patient for follow up. No answer. RN Health Coach left HIPAA compliant voicemail message along with contact information.  Plan:  RN Health Coach will send unsuccessful outreach letter to patient.  RN Health Coach will make another outreach attempt to patient within the month of August if no return call back from patient.   [place] [phone] [name]

## 2020-03-01 DIAGNOSIS — D631 Anemia in chronic kidney disease: Secondary | ICD-10-CM | POA: Diagnosis not present

## 2020-03-01 DIAGNOSIS — Z992 Dependence on renal dialysis: Secondary | ICD-10-CM | POA: Diagnosis not present

## 2020-03-01 DIAGNOSIS — N186 End stage renal disease: Secondary | ICD-10-CM | POA: Diagnosis not present

## 2020-03-01 DIAGNOSIS — D509 Iron deficiency anemia, unspecified: Secondary | ICD-10-CM | POA: Diagnosis not present

## 2020-03-01 DIAGNOSIS — N2581 Secondary hyperparathyroidism of renal origin: Secondary | ICD-10-CM | POA: Diagnosis not present

## 2020-03-02 ENCOUNTER — Telehealth: Payer: Self-pay

## 2020-03-02 DIAGNOSIS — S81812D Laceration without foreign body, left lower leg, subsequent encounter: Secondary | ICD-10-CM | POA: Diagnosis not present

## 2020-03-02 DIAGNOSIS — I120 Hypertensive chronic kidney disease with stage 5 chronic kidney disease or end stage renal disease: Secondary | ICD-10-CM | POA: Diagnosis not present

## 2020-03-02 DIAGNOSIS — S81802D Unspecified open wound, left lower leg, subsequent encounter: Secondary | ICD-10-CM | POA: Diagnosis not present

## 2020-03-02 DIAGNOSIS — L97421 Non-pressure chronic ulcer of left heel and midfoot limited to breakdown of skin: Secondary | ICD-10-CM | POA: Diagnosis not present

## 2020-03-02 DIAGNOSIS — E11621 Type 2 diabetes mellitus with foot ulcer: Secondary | ICD-10-CM | POA: Diagnosis not present

## 2020-03-02 DIAGNOSIS — E1122 Type 2 diabetes mellitus with diabetic chronic kidney disease: Secondary | ICD-10-CM | POA: Diagnosis not present

## 2020-03-02 NOTE — Telephone Encounter (Signed)
Pt called with c/o L foot wound slow to heal. His [place] has suggested he f/u with wound clinic. Pt is now considering surgery and wants to speak to MD to get more information about procedure. Pt has been given an appt with MD for this.

## 2020-03-03 DIAGNOSIS — N186 End stage renal disease: Secondary | ICD-10-CM | POA: Diagnosis not present

## 2020-03-03 DIAGNOSIS — N2581 Secondary hyperparathyroidism of renal origin: Secondary | ICD-10-CM | POA: Diagnosis not present

## 2020-03-03 DIAGNOSIS — D509 Iron deficiency anemia, unspecified: Secondary | ICD-10-CM | POA: Diagnosis not present

## 2020-03-03 DIAGNOSIS — D631 Anemia in chronic kidney disease: Secondary | ICD-10-CM | POA: Diagnosis not present

## 2020-03-03 DIAGNOSIS — Z992 Dependence on renal dialysis: Secondary | ICD-10-CM | POA: Diagnosis not present

## 2020-03-05 DIAGNOSIS — D631 Anemia in chronic kidney disease: Secondary | ICD-10-CM | POA: Diagnosis not present

## 2020-03-05 DIAGNOSIS — D509 Iron deficiency anemia, unspecified: Secondary | ICD-10-CM | POA: Diagnosis not present

## 2020-03-05 DIAGNOSIS — N186 End stage renal disease: Secondary | ICD-10-CM | POA: Diagnosis not present

## 2020-03-05 DIAGNOSIS — N2581 Secondary hyperparathyroidism of renal origin: Secondary | ICD-10-CM | POA: Diagnosis not present

## 2020-03-05 DIAGNOSIS — Z992 Dependence on renal dialysis: Secondary | ICD-10-CM | POA: Diagnosis not present

## 2020-03-08 DIAGNOSIS — D509 Iron deficiency anemia, unspecified: Secondary | ICD-10-CM | POA: Diagnosis not present

## 2020-03-08 DIAGNOSIS — Z992 Dependence on renal dialysis: Secondary | ICD-10-CM | POA: Diagnosis not present

## 2020-03-08 DIAGNOSIS — D631 Anemia in chronic kidney disease: Secondary | ICD-10-CM | POA: Diagnosis not present

## 2020-03-08 DIAGNOSIS — N186 End stage renal disease: Secondary | ICD-10-CM | POA: Diagnosis not present

## 2020-03-08 DIAGNOSIS — N2581 Secondary hyperparathyroidism of renal origin: Secondary | ICD-10-CM | POA: Diagnosis not present

## 2020-03-09 DIAGNOSIS — E11621 Type 2 diabetes mellitus with foot ulcer: Secondary | ICD-10-CM | POA: Diagnosis not present

## 2020-03-09 DIAGNOSIS — S81802D Unspecified open wound, left lower leg, subsequent encounter: Secondary | ICD-10-CM | POA: Diagnosis not present

## 2020-03-09 DIAGNOSIS — I120 Hypertensive chronic kidney disease with stage 5 chronic kidney disease or end stage renal disease: Secondary | ICD-10-CM | POA: Diagnosis not present

## 2020-03-09 DIAGNOSIS — S81812D Laceration without foreign body, left lower leg, subsequent encounter: Secondary | ICD-10-CM | POA: Diagnosis not present

## 2020-03-09 DIAGNOSIS — E1122 Type 2 diabetes mellitus with diabetic chronic kidney disease: Secondary | ICD-10-CM | POA: Diagnosis not present

## 2020-03-09 DIAGNOSIS — L97421 Non-pressure chronic ulcer of left heel and midfoot limited to breakdown of skin: Secondary | ICD-10-CM | POA: Diagnosis not present

## 2020-03-10 ENCOUNTER — Other Ambulatory Visit: Payer: Self-pay

## 2020-03-10 ENCOUNTER — Ambulatory Visit (INDEPENDENT_AMBULATORY_CARE_PROVIDER_SITE_OTHER): Payer: Medicare Other | Admitting: Vascular Surgery

## 2020-03-10 ENCOUNTER — Encounter: Payer: Self-pay | Admitting: Vascular Surgery

## 2020-03-10 VITALS — BP 105/61 | HR 72 | Temp 97.5°F | Ht 76.0 in | Wt 244.9 lb

## 2020-03-10 DIAGNOSIS — I739 Peripheral vascular disease, unspecified: Secondary | ICD-10-CM

## 2020-03-10 DIAGNOSIS — D631 Anemia in chronic kidney disease: Secondary | ICD-10-CM | POA: Diagnosis not present

## 2020-03-10 DIAGNOSIS — Z992 Dependence on renal dialysis: Secondary | ICD-10-CM | POA: Diagnosis not present

## 2020-03-10 DIAGNOSIS — N186 End stage renal disease: Secondary | ICD-10-CM | POA: Diagnosis not present

## 2020-03-10 DIAGNOSIS — N2581 Secondary hyperparathyroidism of renal origin: Secondary | ICD-10-CM | POA: Diagnosis not present

## 2020-03-10 DIAGNOSIS — D509 Iron deficiency anemia, unspecified: Secondary | ICD-10-CM | POA: Diagnosis not present

## 2020-03-10 MED ORDER — SODIUM CHLORIDE 0.9 % IV SOLN: 250.0000 mL | INTRAVENOUS | Status: DC | PRN

## 2020-03-10 NOTE — H&P (View-Only) (Signed)
Patient is a 69 year old male who returns for follow-up today regarding peripheral arterial disease and nonhealing wounds on his left foot. His last arteriogram was in July 2018. This was done by Dr. Gwenlyn Found. He had atherectomy of his left superficial femoral artery and popliteal artery. This was complicated by right groin hematoma which required evacuation. 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 He still has problems with muscle spasms in the right leg. His amputation was in September 2020. He takes Lyrica which she thinks may help a little bit. He currently has several wounds on his left foot which are being taken care of by home health nurse.  It last measured 2 x 3 cm 1 mm depth when I saw him in March 2021.  He is on Plavix and a statin.   Other chronic medical problems include COPD diabetes hypertension neuropathy all of which are currently stable.    Review of systems: He has no shortness of breath.  He has no chest pain.  He is on hemodialysis and dialyzes Tuesday Thursday Saturday       Past Medical History:  Diagnosis Date  . Anemia    . Anxiety    . Arthritis    . Chronic kidney disease      Dialysis T/Th/Sa  . COPD (chronic obstructive pulmonary disease) ([place])      Pt denies  . Depression    . Diabetes mellitus without complication ([place])    . GERD (gastroesophageal reflux disease)    . H/O hiatal hernia    . Headache(784.0)      Hx: Migraines  . History of kidney stones    . Hypertension    . Neuropathy    . Non-healing wound of amputation stump ([place])      right  . Numbness of toes      toes and feet  . Pneumonia      Hx: of several times - pt denies (04/24/19)  . Renal insufficiency    . Shortness of breath dyspnea    . Type 2 diabetes mellitus ([place])        Past Surgical History:  Procedure Laterality Date  . A/V FISTULAGRAM Left 01/16/2019   Procedure: A/V FISTULAGRAM;  Surgeon: [name], MD;  Location: [facility];  Service:  Cardiovascular;  Laterality: Left;  . AMPUTATION Right 12/01/2018   Procedure: RIGHT AMPUTATION BELOW KNEE;  Surgeon: [name], MD;  Location: [facility];  Service: Vascular;  Laterality: Right;  . AMPUTATION Right 04/27/2019   Procedure: AMPUTATION BELOW KNEE REVISION;  Surgeon: [name], MD;  Location: [place];  Service: Vascular;  Laterality: Right;  . AMPUTATION Right 04/30/2019   Procedure: AMPUTATION ABOVE KNEE - RIGHT;  Surgeon: Waynetta Sandy, MD;  Location: [place];  Service: Vascular;  Laterality: Right;  . APPLICATION OF WOUND VAC Right 04/27/2019   Procedure: APPLICATION OF WOUND VAC;  Surgeon: [name], MD;  Location: [place];  Service: Vascular;  Laterality: Right;  . AV FISTULA PLACEMENT  2012      left arm   . AV FISTULA PLACEMENT Left 12/18/2012   Procedure: ARTERIOVENOUS (AV) FISTULA CREATION;  Surgeon: Angelia Mould, MD;  Location: [place];  Service: Vascular;  Laterality: Left;  . COLONOSCOPY  10/26/2011   Procedure: COLONOSCOPY;  Surgeon: Rogene Houston, MD;  [facility];  Service: Endoscopy;  Laterality: N/A;  730  . EMBOLECTOMY Left 12/09/2012   Procedure: EMBOLECTOMY;  Surgeon: Serafina Mitchell, MD;  Location: [facility] CATH LAB;  Service: Cardiovascular;  Laterality: Left;  left arm venous embolization  . ESOPHAGOGASTRODUODENOSCOPY (EGD) WITH ESOPHAGEAL DILATION N/A 04/23/2013   Procedure: ESOPHAGOGASTRODUODENOSCOPY (EGD) WITH ESOPHAGEAL DILATION;  Surgeon: Rogene Houston, MD;  [facility];  Service: Endoscopy;  Laterality: N/A;  200-moved to 930   . FISTULA SUPERFICIALIZATION Left 06/18/2013   Procedure: FISTULA SUPERFICIALIZATION & LIGATION BRANCH X 1;  Surgeon: Mal Misty, MD;  Location: [place];  Service: Vascular;  Laterality: Left;  . HEMATOMA EVACUATION Right 02/11/2017   Procedure: EVACUATION HEMATOMA RIGHT GROIN, Repair of Right Pseudo-anerysm.;  Surgeon: [name], MD;  [facility];  Service: Vascular;   Laterality: Right;  . INGUINAL HERNIA REPAIR     ,  times   2  . INSERTION OF DIALYSIS CATHETER Left 12/18/2012   Procedure: INSERTION OF DIALYSIS CATHETER;  Surgeon: Angelia Mould, MD;  Location: [place];  Service: Vascular;  Laterality: Left;  . KNEE ARTHROSCOPY  2011   Right Knee  . LOWER EXTREMITY ANGIOGRAPHY N/A 02/11/2017   Procedure: Lower Extremity Angiography;  Surgeon: Lorretta Harp, MD;  Location: [place] [facility];  Service: Cardiovascular;  Laterality: N/A;  . PERIPHERAL ATHRECTOMY  02/11/2017  . PERIPHERAL VASCULAR ATHERECTOMY Left 02/11/2017   Procedure: Peripheral Vascular Atherectomy;  Surgeon: Lorretta Harp, MD;  Location: [place] [facility];  Service: Cardiovascular;  Laterality: Left;  . PERIPHERAL VASCULAR BALLOON ANGIOPLASTY Left 01/16/2019   Procedure: PERIPHERAL VASCULAR BALLOON ANGIOPLASTY;  Surgeon: [name], MD;  Location: [place] [facility];  Service: Cardiovascular;  Laterality: Left;  central vein  . REVISON OF ARTERIOVENOUS FISTULA Left 1/70/0174   Procedure: PLICATION OF LEFT BRACHIOCEPHALIC ARTERIOVENOUS FISTULA;  Surgeon: Conrad Rachel, MD;  Location: [place];  Service: Vascular;  Laterality: Left;  . SHUNTOGRAM N/A 12/09/2012   Procedure: fistulogram;  Surgeon: Serafina Mitchell, MD;  Location: [facility] C[facility] LAB;  Service: Cardiovascular;  Laterality: N/A;  . SHUNTOGRAM Left 06/03/2013   Procedure: Fistulogram;  Surgeon: Serafina Mitchell, MD;  Location: [facility] LAB;  Service: Cardiovascular;  Laterality: Left;  . THROMBECTOMY W/ EMBOLECTOMY Left 12/11/2012   Procedure: THROMBECTOMY ARTERIOVENOUS FISTULA;  Surgeon: Serafina Mitchell, MD;  Location: [place];  Service: Vascular;  Laterality: Left;  . TONSILLECTOMY    . WOUND DEBRIDEMENT Right 02/11/2019   Procedure: DEBRIDEMENT WOUND RIGHT BELOW THE KNEE STUMP;  Surgeon: Serafina Mitchell, MD;  [facility];  Service: Vascular;  Laterality: Right;      Review of systems: He has shortness of breath with  exertion.  He is on hemodialysis is due Thursday Saturday.  He does not describe chest pain.         Current Outpatient Medications on File Prior to Visit  Medication Sig Dispense Refill  . albuterol (VENTOLIN HFA) 108 (90 Base) MCG/ACT inhaler Inhale 1-2 puffs into the lungs every 6 (six) hours as needed for wheezing or shortness of breath.      . ARTIFICIAL TEAR OP Place 1 drop into both eyes every 6 (six) hours as needed (dry eyes).      Marland Kitchen atorvastatin (LIPITOR) 80 MG tablet Take 1 tablet (80 mg total) by mouth at bedtime. (Patient taking differently: Take 80 mg by mouth daily. ) 30 tablet 5  . bismuth subsalicylate (PEPTO BISMOL) 262 MG/15ML suspension Take 30 mLs by mouth every 6 (six) hours as needed for indigestion.      Marland Kitchen  calcium acetate (PHOSLO) 667 MG capsule Take 4 capsules (2,668 mg total) by mouth 3 (three) times daily with meals. (Patient taking differently: Take [phone] mg by mouth See admin instructions. Take 2668 mg with meals and 1334 with each snack) 360 capsule 0  . cinacalcet (SENSIPAR) 30 MG tablet Take 1 tablet (30 mg total) by mouth every evening. 60 tablet 0  . clopidogrel (PLAVIX) 75 MG tablet Take 1 tablet (75 mg total) by mouth daily. 30 tablet 0  . diphenhydrAMINE (BENADRYL) 25 MG tablet Take 25 mg by mouth daily as needed for itching.      Marland Kitchen ibuprofen (ADVIL) 200 MG tablet Take 800 mg by mouth 3 (three) times daily as needed for headache or moderate pain.      Marland Kitchen levothyroxine (SYNTHROID) 125 MCG tablet Take 1 tablet (125 mcg total) by mouth daily before breakfast. (Patient taking differently: Take 125 mcg by mouth at bedtime. ) 30 tablet 0  . lisinopril (ZESTRIL) 10 MG tablet Take 10 mg by mouth daily.      . Melatonin 10 MG CAPS Take 10 mg by mouth at bedtime.      . multivitamin (RENA-VIT) TABS tablet Take 1 tablet by mouth daily.      . polyethylene glycol (MIRALAX / GLYCOLAX) 17 g packet Take 17 g by mouth daily as needed for moderate constipation.      .  pregabalin (LYRICA) 150 MG capsule Take 1 capsule (150 mg total) by mouth 3 (three) times daily. 90 capsule 0  . sevelamer carbonate (RENVELA) 800 MG tablet Take 1,600 mg by mouth 3 (three) times daily.      [name] 300 UNIT/ML SOPN Inject 54-84 Units as directed at bedtime as needed (if blood sugar is 140 or higher).         No current facility-administered medications on file prior to visit.   Physical exam:  Vitals:   03/10/20 1500  BP: (!) 105/61  Pulse: 72  Temp: (!) 97.5 F (36.4 C)  TempSrc: Skin  SpO2: 95%  Weight: (!) 244 lb 14.9 oz (111.1 kg)  Height: 6\' 4"  (1.93 m)    Extremities: 2+ left femoral pulse 2+ right femoral pulse scattered ulcerations left leg lateral calf dorsal foot and the largest ulcer is about 2 cm in diameter on the left heel none of these appear to be significantly healing   Data: I reviewed the patient's lower extremity duplex exam from February 04, 2020.  This showed occlusion of his left superficial femoral artery with monophasic flow in the popliteal anterior tibial and posterior tibial arteries there was also profunda stenosis.   Assessment: Nonhealing wounds left foot despite wound care management.  Known left superficial femoral artery occlusion.  Plan: Aortogram lower extremity runoff possible intervention scheduled for Friday, March 18, 2020.  Risk benefits possible complications and procedure details were explained to the patient today include but not limited to bleeding infection vessel injury.  Of note he did have a hematoma in his right groin that required evacuation the last time Dr. Gwenlyn Found did his angiogram in 2018.  I did discuss with him that this would be a risk but hopefully we would not have any bleeding from his right groin.  Patient wishes to proceed.  [name], MD [facility] Office: [phone]

## 2020-03-10 NOTE — H&P (View-Only) (Signed)
Patient is a 69 year old male who returns for follow-up today regarding peripheral arterial disease and nonhealing wounds on his left foot. His last arteriogram was in July 2018. This was done by Dr. Gwenlyn Found. He had atherectomy of his left superficial femoral artery and popliteal artery. This was complicated by right groin hematoma which required evacuation. 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 He still has problems with muscle spasms in the right leg. His amputation was in September 2020. He takes Lyrica which she thinks may help a little bit. He currently has several wounds on his left foot which are being taken care of by home health nurse.  It last measured 2 x 3 cm 1 mm depth when I saw him in March 2021.  He is on Plavix and a statin.   Other chronic medical problems include COPD diabetes hypertension neuropathy all of which are currently stable.    Review of systems: He has no shortness of breath.  He has no chest pain.  He is on hemodialysis and dialyzes Tuesday Thursday Saturday       Past Medical History:  Diagnosis Date   Anemia     Anxiety     Arthritis     Chronic kidney disease      Dialysis T/Th/Sa   COPD (chronic obstructive pulmonary disease) ([facility])      Pt denies   Depression     Diabetes mellitus without complication ([facility])     GERD (gastroesophageal reflux disease)     H/O hiatal hernia     Headache(784.0)      Hx: Migraines   History of kidney stones     Hypertension     Neuropathy     Non-healing wound of amputation stump ([facility])      right   Numbness of toes      toes and feet   Pneumonia      Hx: of several times - pt denies (04/24/19)   Renal insufficiency     Shortness of breath dyspnea     Type 2 diabetes mellitus ([place])        Past Surgical History:  Procedure Laterality Date   A/V FISTULAGRAM Left 01/16/2019   Procedure: A/V FISTULAGRAM;  Surgeon: [name], MD;  Location: [place] [facility];  Service:  Cardiovascular;  Laterality: Left;   AMPUTATION Right 12/01/2018   Procedure: RIGHT AMPUTATION BELOW KNEE;  Surgeon: [name], MD;  Location: [place];  Service: Vascular;  Laterality: Right;   AMPUTATION Right 04/27/2019   Procedure: AMPUTATION BELOW KNEE REVISION;  Surgeon: [name], MD;  Location: [place];  Service: Vascular;  Laterality: Right;   AMPUTATION Right 04/30/2019   Procedure: AMPUTATION ABOVE KNEE - RIGHT;  Surgeon: Waynetta Sandy, MD;  Location: [place];  Service: Vascular;  Laterality: Right;   APPLICATION OF WOUND VAC Right 04/27/2019   Procedure: APPLICATION OF WOUND VAC;  Surgeon: [name], MD;  Location: [place];  Service: Vascular;  Laterality: Right;   AV FISTULA PLACEMENT  2012      left arm    AV FISTULA PLACEMENT Left 12/18/2012   Procedure: ARTERIOVENOUS (AV) FISTULA CREATION;  Surgeon: Angelia Mould, MD;  Location: [place];  Service: Vascular;  Laterality: Left;   COLONOSCOPY  10/26/2011   Procedure: COLONOSCOPY;  Surgeon: Rogene Houston, MD;  [facility];  Service: Endoscopy;  Laterality: N/A;  730   EMBOLECTOMY Left 12/09/2012   Procedure: EMBOLECTOMY;  Surgeon: Serafina Mitchell, MD;  Location: [facility];  Service: Cardiovascular;  Laterality: Left;  left arm venous embolization   ESOPHAGOGASTRODUODENOSCOPY (EGD) WITH ESOPHAGEAL DILATION N/A 04/23/2013   Procedure: ESOPHAGOGASTRODUODENOSCOPY (EGD) WITH ESOPHAGEAL DILATION;  Surgeon: Rogene Houston, MD;  [facility];  Service: Endoscopy;  Laterality: N/A;  200-moved to 930    FISTULA SUPERFICIALIZATION Left 06/18/2013   Procedure: FISTULA SUPERFICIALIZATION & LIGATION BRANCH X 1;  Surgeon: Mal Misty, MD;  Location: [place];  Service: Vascular;  Laterality: Left;   HEMATOMA EVACUATION Right 02/11/2017   Procedure: EVACUATION HEMATOMA RIGHT GROIN, Repair of Right Pseudo-anerysm.;  Surgeon: [name], MD;  Location: [place];  Service: Vascular;   Laterality: Right;   INGUINAL HERNIA REPAIR     ,  times   2   INSERTION OF DIALYSIS CATHETER Left 12/18/2012   Procedure: INSERTION OF DIALYSIS CATHETER;  Surgeon: Angelia Mould, MD;  Location: [place];  Service: Vascular;  Laterality: Left;   KNEE ARTHROSCOPY  2011   Right Knee   LOWER EXTREMITY ANGIOGRAPHY N/A 02/11/2017   Procedure: Lower Extremity Angiography;  Surgeon: Lorretta Harp, MD;  Location: [place] [facility];  Service: Cardiovascular;  Laterality: N/A;   PERIPHERAL ATHRECTOMY  02/11/2017   PERIPHERAL VASCULAR ATHERECTOMY Left 02/11/2017   Procedure: Peripheral Vascular Atherectomy;  Surgeon: Lorretta Harp, MD;  Location: [place] [facility];  Service: Cardiovascular;  Laterality: Left;   PERIPHERAL VASCULAR BALLOON ANGIOPLASTY Left 01/16/2019   Procedure: PERIPHERAL VASCULAR BALLOON ANGIOPLASTY;  Surgeon: [name], MD;  Location: [place] [facility];  Service: Cardiovascular;  Laterality: Left;  central vein   REVISON OF ARTERIOVENOUS FISTULA Left 1/60/1093   Procedure: PLICATION OF LEFT BRACHIOCEPHALIC ARTERIOVENOUS FISTULA;  Surgeon: Conrad Montrose, MD;  Location: [place];  Service: Vascular;  Laterality: Left;   SHUNTOGRAM N/A 12/09/2012   Procedure: fistulogram;  Surgeon: Serafina Mitchell, MD;  Location: [facility] LAB;  Service: Cardiovascular;  Laterality: N/A;   SHUNTOGRAM Left 06/03/2013   Procedure: Fistulogram;  Surgeon: Serafina Mitchell, MD;  Location: [facility] CATH LAB;  Service: Cardiovascular;  Laterality: Left;   THROMBECTOMY W/ EMBOLECTOMY Left 12/11/2012   Procedure: THROMBECTOMY ARTERIOVENOUS FISTULA;  Surgeon: Serafina Mitchell, MD;  Location: [place];  Service: Vascular;  Laterality: Left;   TONSILLECTOMY     WOUND DEBRIDEMENT Right 02/11/2019   Procedure: DEBRIDEMENT WOUND RIGHT BELOW THE KNEE STUMP;  Surgeon: Serafina Mitchell, MD;  [facility];  Service: Vascular;  Laterality: Right;      Review of systems: He has shortness of breath with  exertion.  He is on hemodialysis is due Thursday Saturday.  He does not describe chest pain.         Current Outpatient Medications on File Prior to Visit  Medication Sig Dispense Refill   albuterol (VENTOLIN HFA) 108 (90 Base) MCG/ACT inhaler Inhale 1-2 puffs into the lungs every 6 (six) hours as needed for wheezing or shortness of breath.       ARTIFICIAL TEAR OP Place 1 drop into both eyes every 6 (six) hours as needed (dry eyes).       atorvastatin (LIPITOR) 80 MG tablet Take 1 tablet (80 mg total) by mouth at bedtime. (Patient taking differently: Take 80 mg by mouth daily. ) 30 tablet 5   bismuth subsalicylate (PEPTO BISMOL) 262 MG/15ML suspension Take 30 mLs by mouth every 6 (six) hours as needed for indigestion.  calcium acetate (PHOSLO) 667 MG capsule Take 4 capsules (2,668 mg total) by mouth 3 (three) times daily with meals. (Patient taking differently: Take [phone] mg by mouth See admin instructions. Take 2668 mg with meals and 1334 with each snack) 360 capsule 0   cinacalcet (SENSIPAR) 30 MG tablet Take 1 tablet (30 mg total) by mouth every evening. 60 tablet 0   clopidogrel (PLAVIX) 75 MG tablet Take 1 tablet (75 mg total) by mouth daily. 30 tablet 0   diphenhydrAMINE (BENADRYL) 25 MG tablet Take 25 mg by mouth daily as needed for itching.       ibuprofen (ADVIL) 200 MG tablet Take 800 mg by mouth 3 (three) times daily as needed for headache or moderate pain.       levothyroxine (SYNTHROID) 125 MCG tablet Take 1 tablet (125 mcg total) by mouth daily before breakfast. (Patient taking differently: Take 125 mcg by mouth at bedtime. ) 30 tablet 0   lisinopril (ZESTRIL) 10 MG tablet Take 10 mg by mouth daily.       Melatonin 10 MG CAPS Take 10 mg by mouth at bedtime.       multivitamin (RENA-VIT) TABS tablet Take 1 tablet by mouth daily.       polyethylene glycol (MIRALAX / GLYCOLAX) 17 g packet Take 17 g by mouth daily as needed for moderate constipation.        pregabalin (LYRICA) 150 MG capsule Take 1 capsule (150 mg total) by mouth 3 (three) times daily. 90 capsule 0   sevelamer carbonate (RENVELA) 800 MG tablet Take 1,600 mg by mouth 3 (three) times daily.       TOUJEO SOLOSTAR 300 UNIT/ML SOPN Inject 54-84 Units as directed at bedtime as needed (if blood sugar is 140 or higher).         No current facility-administered medications on file prior to visit.   Physical exam:  Vitals:   03/10/20 1500  BP: (!) 105/61  Pulse: 72  Temp: (!) 97.5 F (36.4 C)  TempSrc: Skin  SpO2: 95%  Weight: (!) 244 lb 14.9 oz (111.1 kg)  Height: 6\' 4"  (1.93 m)    Extremities: 2+ left femoral pulse 2+ right femoral pulse scattered ulcerations left leg lateral calf dorsal foot and the largest ulcer is about 2 cm in diameter on the left heel none of these appear to be significantly healing   Data: I reviewed the patient's lower extremity duplex exam from February 04, 2020.  This showed occlusion of his left superficial femoral artery with monophasic flow in the popliteal anterior tibial and posterior tibial arteries there was also profunda stenosis.   Assessment: Nonhealing wounds left foot despite wound care management.  Known left superficial femoral artery occlusion.  Plan: Aortogram lower extremity runoff possible intervention scheduled for Friday, March 18, 2020.  Risk benefits possible complications and procedure details were explained to the patient today include but not limited to bleeding infection vessel injury.  Of note he did have a hematoma in his right groin that required evacuation the last time Dr. Gwenlyn Found did his angiogram in 2018.  I did discuss with him that this would be a risk but hopefully we would not have any bleeding from his right groin.  Patient wishes to proceed.  [name], MD [facility] [place] Office: [phone]

## 2020-03-10 NOTE — Progress Notes (Signed)
Patient is a 69 year old male who returns for follow-up today regarding peripheral arterial disease and nonhealing wounds on his left foot. His last arteriogram was in July 2018. This was done by Dr. Gwenlyn Found. He had atherectomy of his left superficial femoral artery and popliteal artery. This was complicated by right groin hematoma which required evacuation. 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 He still has problems with muscle spasms in the right leg. His amputation was in September 2020. He takes Lyrica which she thinks may help a little bit. He currently has several wounds on his left foot which are being taken care of by home health nurse.  It last measured 2 x 3 cm 1 mm depth when I saw him in March 2021.  He is on Plavix and a statin.   Other chronic medical problems include COPD diabetes hypertension neuropathy all of which are currently stable.    Review of systems: He has no shortness of breath.  He has no chest pain.  He is on hemodialysis and dialyzes Tuesday Thursday Saturday       Past Medical History:  Diagnosis Date  . Anemia    . Anxiety    . Arthritis    . Chronic kidney disease      Dialysis T/Th/Sa  . COPD (chronic obstructive pulmonary disease) ([place])      Pt denies  . Depression    . Diabetes mellitus without complication ([place])    . GERD (gastroesophageal reflux disease)    . H/O hiatal hernia    . Headache(784.0)      Hx: Migraines  . History of kidney stones    . Hypertension    . Neuropathy    . Non-healing wound of amputation stump ([place])      right  . Numbness of toes      toes and feet  . Pneumonia      Hx: of several times - pt denies (04/24/19)  . Renal insufficiency    . Shortness of breath dyspnea    . Type 2 diabetes mellitus ([place])        Past Surgical History:  Procedure Laterality Date  . A/V FISTULAGRAM Left 01/16/2019   Procedure: A/V FISTULAGRAM;  Surgeon: [name], MD;  Location: [place] [facility];  Service:  Cardiovascular;  Laterality: Left;  . AMPUTATION Right 12/01/2018   Procedure: RIGHT AMPUTATION BELOW KNEE;  Surgeon: [name], MD;  Location: [facility] OR;  Service: Vascular;  Laterality: Right;  . AMPUTATION Right 04/27/2019   Procedure: AMPUTATION BELOW KNEE REVISION;  Surgeon: [name], MD;  Location: [place];  Service: Vascular;  Laterality: Right;  . AMPUTATION Right 04/30/2019   Procedure: AMPUTATION ABOVE KNEE - RIGHT;  Surgeon: Waynetta Sandy, MD;  Location: [place];  Service: Vascular;  Laterality: Right;  . APPLICATION OF WOUND VAC Right 04/27/2019   Procedure: APPLICATION OF WOUND VAC;  Surgeon: [name], MD;  Location: [place];  Service: Vascular;  Laterality: Right;  . AV FISTULA PLACEMENT  2012      left arm   . AV FISTULA PLACEMENT Left 12/18/2012   Procedure: ARTERIOVENOUS (AV) FISTULA CREATION;  Surgeon: Angelia Mould, MD;  Location: [place];  Service: Vascular;  Laterality: Left;  . COLONOSCOPY  10/26/2011   Procedure: COLONOSCOPY;  Surgeon: Rogene Houston, MD;  [facility];  Service: Endoscopy;  Laterality: N/A;  730  . EMBOLECTOMY Left 12/09/2012   Procedure: EMBOLECTOMY;  Surgeon: Serafina Mitchell, MD;  Location: [facility] CATH LAB;  Service: Cardiovascular;  Laterality: Left;  left arm venous embolization  . ESOPHAGOGASTRODUODENOSCOPY (EGD) WITH ESOPHAGEAL DILATION N/A 04/23/2013   Procedure: ESOPHAGOGASTRODUODENOSCOPY (EGD) WITH ESOPHAGEAL DILATION;  Surgeon: Rogene Houston, MD;  [facility];  Service: Endoscopy;  Laterality: N/A;  200-moved to 930   . FISTULA SUPERFICIALIZATION Left 06/18/2013   Procedure: FISTULA SUPERFICIALIZATION & LIGATION BRANCH X 1;  Surgeon: Mal Misty, MD;  Location: [place];  Service: Vascular;  Laterality: Left;  . HEMATOMA EVACUATION Right 02/11/2017   Procedure: EVACUATION HEMATOMA RIGHT GROIN, Repair of Right Pseudo-anerysm.;  Surgeon: [name], MD;  [facility];  Service: Vascular;   Laterality: Right;  . INGUINAL HERNIA REPAIR     ,  times   2  . INSERTION OF DIALYSIS CATHETER Left 12/18/2012   Procedure: INSERTION OF DIALYSIS CATHETER;  Surgeon: Angelia Mould, MD;  Location: [place];  Service: Vascular;  Laterality: Left;  . KNEE ARTHROSCOPY  2011   Right Knee  . LOWER EXTREMITY ANGIOGRAPHY N/A 02/11/2017   Procedure: Lower Extremity Angiography;  Surgeon: Lorretta Harp, MD;  Location: [place] [facility];  Service: Cardiovascular;  Laterality: N/A;  . PERIPHERAL ATHRECTOMY  02/11/2017  . PERIPHERAL VASCULAR ATHERECTOMY Left 02/11/2017   Procedure: Peripheral Vascular Atherectomy;  Surgeon: Lorretta Harp, MD;  Location: [place] [facility];  Service: Cardiovascular;  Laterality: Left;  . PERIPHERAL VASCULAR BALLOON ANGIOPLASTY Left 01/16/2019   Procedure: PERIPHERAL VASCULAR BALLOON ANGIOPLASTY;  Surgeon: [name], MD;  Location: [place] [facility];  Service: Cardiovascular;  Laterality: Left;  central vein  . REVISON OF ARTERIOVENOUS FISTULA Left 01/24/4314   Procedure: PLICATION OF LEFT BRACHIOCEPHALIC ARTERIOVENOUS FISTULA;  Surgeon: Conrad Knob Noster, MD;  Location: [place];  Service: Vascular;  Laterality: Left;  . SHUNTOGRAM N/A 12/09/2012   Procedure: fistulogram;  Surgeon: Serafina Mitchell, MD;  Location: [facility] CATH LAB;  Service: Cardiovascular;  Laterality: N/A;  . SHUNTOGRAM Left 06/03/2013   Procedure: Fistulogram;  Surgeon: Serafina Mitchell, MD;  Location: [facility] CATH LAB;  Service: Cardiovascular;  Laterality: Left;  . THROMBECTOMY W/ EMBOLECTOMY Left 12/11/2012   Procedure: THROMBECTOMY ARTERIOVENOUS FISTULA;  Surgeon: Serafina Mitchell, MD;  Location: [place];  Service: Vascular;  Laterality: Left;  . TONSILLECTOMY    . WOUND DEBRIDEMENT Right 02/11/2019   Procedure: DEBRIDEMENT WOUND RIGHT BELOW THE KNEE STUMP;  Surgeon: Serafina Mitchell, MD;  [facility];  Service: Vascular;  Laterality: Right;      Review of systems: He has shortness of breath with  exertion.  He is on hemodialysis is due Thursday Saturday.  He does not describe chest pain.         Current Outpatient Medications on File Prior to Visit  Medication Sig Dispense Refill  . albuterol (VENTOLIN HFA) 108 (90 Base) MCG/ACT inhaler Inhale 1-2 puffs into the lungs every 6 (six) hours as needed for wheezing or shortness of breath.      . ARTIFICIAL TEAR OP Place 1 drop into both eyes every 6 (six) hours as needed (dry eyes).      Marland Kitchen atorvastatin (LIPITOR) 80 MG tablet Take 1 tablet (80 mg total) by mouth at bedtime. (Patient taking differently: Take 80 mg by mouth daily. ) 30 tablet 5  . bismuth subsalicylate (PEPTO BISMOL) 262 MG/15ML suspension Take 30 mLs by mouth every 6 (six) hours as needed for indigestion.      Marland Kitchen  calcium acetate (PHOSLO) 667 MG capsule Take 4 capsules (2,668 mg total) by mouth 3 (three) times daily with meals. (Patient taking differently: Take [phone] mg by mouth See admin instructions. Take 2668 mg with meals and 1334 with each snack) 360 capsule 0  . cinacalcet (SENSIPAR) 30 MG tablet Take 1 tablet (30 mg total) by mouth every evening. 60 tablet 0  . clopidogrel (PLAVIX) 75 MG tablet Take 1 tablet (75 mg total) by mouth daily. 30 tablet 0  . diphenhydrAMINE (BENADRYL) 25 MG tablet Take 25 mg by mouth daily as needed for itching.      Marland Kitchen ibuprofen (ADVIL) 200 MG tablet Take 800 mg by mouth 3 (three) times daily as needed for headache or moderate pain.      Marland Kitchen levothyroxine (SYNTHROID) 125 MCG tablet Take 1 tablet (125 mcg total) by mouth daily before breakfast. (Patient taking differently: Take 125 mcg by mouth at bedtime. ) 30 tablet 0  . lisinopril (ZESTRIL) 10 MG tablet Take 10 mg by mouth daily.      . Melatonin 10 MG CAPS Take 10 mg by mouth at bedtime.      . multivitamin (RENA-VIT) TABS tablet Take 1 tablet by mouth daily.      . polyethylene glycol (MIRALAX / GLYCOLAX) 17 g packet Take 17 g by mouth daily as needed for moderate constipation.      .  pregabalin (LYRICA) 150 MG capsule Take 1 capsule (150 mg total) by mouth 3 (three) times daily. 90 capsule 0  . sevelamer carbonate (RENVELA) 800 MG tablet Take 1,600 mg by mouth 3 (three) times daily.      [name] 300 UNIT/ML SOPN Inject 54-84 Units as directed at bedtime as needed (if blood sugar is 140 or higher).         No current facility-administered medications on file prior to visit.   Physical exam:  Vitals:   03/10/20 1500  BP: (!) 105/61  Pulse: 72  Temp: (!) 97.5 F (36.4 C)  TempSrc: Skin  SpO2: 95%  Weight: (!) 244 lb 14.9 oz (111.1 kg)  Height: 6\' 4"  (1.93 m)    Extremities: 2+ left femoral pulse 2+ right femoral pulse scattered ulcerations left leg lateral calf dorsal foot and the largest ulcer is about 2 cm in diameter on the left heel none of these appear to be significantly healing   Data: I reviewed the patient's lower extremity duplex exam from February 04, 2020.  This showed occlusion of his left superficial femoral artery with monophasic flow in the popliteal anterior tibial and posterior tibial arteries there was also profunda stenosis.   Assessment: Nonhealing wounds left foot despite wound care management.  Known left superficial femoral artery occlusion.  Plan: Aortogram lower extremity runoff possible intervention scheduled for Friday, March 18, 2020.  Risk benefits possible complications and procedure details were explained to the patient today include but not limited to bleeding infection vessel injury.  Of note he did have a hematoma in his right groin that required evacuation the last time Dr. Gwenlyn Found did his angiogram in 2018.  I did discuss with him that this would be a risk but hopefully we would not have any bleeding from his right groin.  Patient wishes to proceed.  [name], MD [facility] Office: [phone]

## 2020-03-12 DIAGNOSIS — D509 Iron deficiency anemia, unspecified: Secondary | ICD-10-CM | POA: Diagnosis not present

## 2020-03-12 DIAGNOSIS — N2581 Secondary hyperparathyroidism of renal origin: Secondary | ICD-10-CM | POA: Diagnosis not present

## 2020-03-12 DIAGNOSIS — N186 End stage renal disease: Secondary | ICD-10-CM | POA: Diagnosis not present

## 2020-03-12 DIAGNOSIS — Z992 Dependence on renal dialysis: Secondary | ICD-10-CM | POA: Diagnosis not present

## 2020-03-12 DIAGNOSIS — D631 Anemia in chronic kidney disease: Secondary | ICD-10-CM | POA: Diagnosis not present

## 2020-03-13 DIAGNOSIS — Z992 Dependence on renal dialysis: Secondary | ICD-10-CM | POA: Diagnosis not present

## 2020-03-13 DIAGNOSIS — D509 Iron deficiency anemia, unspecified: Secondary | ICD-10-CM | POA: Diagnosis not present

## 2020-03-13 DIAGNOSIS — N2581 Secondary hyperparathyroidism of renal origin: Secondary | ICD-10-CM | POA: Diagnosis not present

## 2020-03-13 DIAGNOSIS — D631 Anemia in chronic kidney disease: Secondary | ICD-10-CM | POA: Diagnosis not present

## 2020-03-13 DIAGNOSIS — N186 End stage renal disease: Secondary | ICD-10-CM | POA: Diagnosis not present

## 2020-03-14 DIAGNOSIS — F329 Major depressive disorder, single episode, unspecified: Secondary | ICD-10-CM | POA: Diagnosis not present

## 2020-03-14 DIAGNOSIS — Z89611 Acquired absence of right leg above knee: Secondary | ICD-10-CM | POA: Diagnosis not present

## 2020-03-14 DIAGNOSIS — E1151 Type 2 diabetes mellitus with diabetic peripheral angiopathy without gangrene: Secondary | ICD-10-CM | POA: Diagnosis not present

## 2020-03-14 DIAGNOSIS — G43909 Migraine, unspecified, not intractable, without status migrainosus: Secondary | ICD-10-CM | POA: Diagnosis not present

## 2020-03-14 DIAGNOSIS — S81812D Laceration without foreign body, left lower leg, subsequent encounter: Secondary | ICD-10-CM | POA: Diagnosis not present

## 2020-03-14 DIAGNOSIS — L97421 Non-pressure chronic ulcer of left heel and midfoot limited to breakdown of skin: Secondary | ICD-10-CM | POA: Diagnosis not present

## 2020-03-14 DIAGNOSIS — E785 Hyperlipidemia, unspecified: Secondary | ICD-10-CM | POA: Diagnosis not present

## 2020-03-14 DIAGNOSIS — E039 Hypothyroidism, unspecified: Secondary | ICD-10-CM | POA: Diagnosis not present

## 2020-03-14 DIAGNOSIS — Z794 Long term (current) use of insulin: Secondary | ICD-10-CM | POA: Diagnosis not present

## 2020-03-14 DIAGNOSIS — N186 End stage renal disease: Secondary | ICD-10-CM | POA: Diagnosis not present

## 2020-03-14 DIAGNOSIS — Z992 Dependence on renal dialysis: Secondary | ICD-10-CM | POA: Diagnosis not present

## 2020-03-14 DIAGNOSIS — E1122 Type 2 diabetes mellitus with diabetic chronic kidney disease: Secondary | ICD-10-CM | POA: Diagnosis not present

## 2020-03-14 DIAGNOSIS — Z9181 History of falling: Secondary | ICD-10-CM | POA: Diagnosis not present

## 2020-03-14 DIAGNOSIS — I120 Hypertensive chronic kidney disease with stage 5 chronic kidney disease or end stage renal disease: Secondary | ICD-10-CM | POA: Diagnosis not present

## 2020-03-14 DIAGNOSIS — Z7902 Long term (current) use of antithrombotics/antiplatelets: Secondary | ICD-10-CM | POA: Diagnosis not present

## 2020-03-14 DIAGNOSIS — J449 Chronic obstructive pulmonary disease, unspecified: Secondary | ICD-10-CM | POA: Diagnosis not present

## 2020-03-14 DIAGNOSIS — M1991 Primary osteoarthritis, unspecified site: Secondary | ICD-10-CM | POA: Diagnosis not present

## 2020-03-14 DIAGNOSIS — E1142 Type 2 diabetes mellitus with diabetic polyneuropathy: Secondary | ICD-10-CM | POA: Diagnosis not present

## 2020-03-14 DIAGNOSIS — F419 Anxiety disorder, unspecified: Secondary | ICD-10-CM | POA: Diagnosis not present

## 2020-03-14 DIAGNOSIS — S81802D Unspecified open wound, left lower leg, subsequent encounter: Secondary | ICD-10-CM | POA: Diagnosis not present

## 2020-03-14 DIAGNOSIS — K219 Gastro-esophageal reflux disease without esophagitis: Secondary | ICD-10-CM | POA: Diagnosis not present

## 2020-03-14 DIAGNOSIS — F17210 Nicotine dependence, cigarettes, uncomplicated: Secondary | ICD-10-CM | POA: Diagnosis not present

## 2020-03-14 DIAGNOSIS — E11621 Type 2 diabetes mellitus with foot ulcer: Secondary | ICD-10-CM | POA: Diagnosis not present

## 2020-03-15 DIAGNOSIS — H4710 Unspecified papilledema: Secondary | ICD-10-CM | POA: Diagnosis not present

## 2020-03-16 DIAGNOSIS — I120 Hypertensive chronic kidney disease with stage 5 chronic kidney disease or end stage renal disease: Secondary | ICD-10-CM | POA: Diagnosis not present

## 2020-03-16 DIAGNOSIS — S81802D Unspecified open wound, left lower leg, subsequent encounter: Secondary | ICD-10-CM | POA: Diagnosis not present

## 2020-03-16 DIAGNOSIS — L97421 Non-pressure chronic ulcer of left heel and midfoot limited to breakdown of skin: Secondary | ICD-10-CM | POA: Diagnosis not present

## 2020-03-16 DIAGNOSIS — S81812D Laceration without foreign body, left lower leg, subsequent encounter: Secondary | ICD-10-CM | POA: Diagnosis not present

## 2020-03-16 DIAGNOSIS — E11621 Type 2 diabetes mellitus with foot ulcer: Secondary | ICD-10-CM | POA: Diagnosis not present

## 2020-03-16 DIAGNOSIS — E1122 Type 2 diabetes mellitus with diabetic chronic kidney disease: Secondary | ICD-10-CM | POA: Diagnosis not present

## 2020-03-17 DIAGNOSIS — Z992 Dependence on renal dialysis: Secondary | ICD-10-CM | POA: Diagnosis not present

## 2020-03-17 DIAGNOSIS — N186 End stage renal disease: Secondary | ICD-10-CM | POA: Diagnosis not present

## 2020-03-17 DIAGNOSIS — D509 Iron deficiency anemia, unspecified: Secondary | ICD-10-CM | POA: Diagnosis not present

## 2020-03-17 DIAGNOSIS — D631 Anemia in chronic kidney disease: Secondary | ICD-10-CM | POA: Diagnosis not present

## 2020-03-17 DIAGNOSIS — N2581 Secondary hyperparathyroidism of renal origin: Secondary | ICD-10-CM | POA: Diagnosis not present

## 2020-03-18 ENCOUNTER — Ambulatory Visit (HOSPITAL_BASED_OUTPATIENT_CLINIC_OR_DEPARTMENT_OTHER): Payer: Medicare Other

## 2020-03-18 ENCOUNTER — Ambulatory Visit (HOSPITAL_COMMUNITY)
Admission: RE | Admit: 2020-03-18 | Discharge: 2020-03-18 | Disposition: A | Discharge status: Home / Self Care | Payer: Medicare Other | Attending: Vascular Surgery | Admitting: Vascular Surgery

## 2020-03-18 ENCOUNTER — Other Ambulatory Visit: Payer: Self-pay

## 2020-03-18 ENCOUNTER — Encounter (HOSPITAL_COMMUNITY)
Admission: RE | Disposition: A | Discharge status: Home / Self Care | Payer: Self-pay | Source: Home / Self Care | Attending: Vascular Surgery

## 2020-03-18 DIAGNOSIS — E11621 Type 2 diabetes mellitus with foot ulcer: Secondary | ICD-10-CM | POA: Insufficient documentation

## 2020-03-18 DIAGNOSIS — Z20822 Contact with and (suspected) exposure to covid-19: Secondary | ICD-10-CM | POA: Diagnosis not present

## 2020-03-18 DIAGNOSIS — Z01810 Encounter for preprocedural cardiovascular examination: Secondary | ICD-10-CM

## 2020-03-18 DIAGNOSIS — L97429 Non-pressure chronic ulcer of left heel and midfoot with unspecified severity: Secondary | ICD-10-CM | POA: Diagnosis not present

## 2020-03-18 DIAGNOSIS — Z7902 Long term (current) use of antithrombotics/antiplatelets: Secondary | ICD-10-CM | POA: Insufficient documentation

## 2020-03-18 DIAGNOSIS — I70245 Atherosclerosis of native arteries of left leg with ulceration of other part of foot: Secondary | ICD-10-CM | POA: Insufficient documentation

## 2020-03-18 DIAGNOSIS — F419 Anxiety disorder, unspecified: Secondary | ICD-10-CM | POA: Diagnosis not present

## 2020-03-18 DIAGNOSIS — Z794 Long term (current) use of insulin: Secondary | ICD-10-CM | POA: Insufficient documentation

## 2020-03-18 DIAGNOSIS — F329 Major depressive disorder, single episode, unspecified: Secondary | ICD-10-CM | POA: Insufficient documentation

## 2020-03-18 DIAGNOSIS — I129 Hypertensive chronic kidney disease with stage 1 through stage 4 chronic kidney disease, or unspecified chronic kidney disease: Secondary | ICD-10-CM | POA: Insufficient documentation

## 2020-03-18 DIAGNOSIS — Z79899 Other long term (current) drug therapy: Secondary | ICD-10-CM | POA: Diagnosis not present

## 2020-03-18 DIAGNOSIS — I70244 Atherosclerosis of native arteries of left leg with ulceration of heel and midfoot: Secondary | ICD-10-CM | POA: Diagnosis not present

## 2020-03-18 DIAGNOSIS — Z992 Dependence on renal dialysis: Secondary | ICD-10-CM | POA: Insufficient documentation

## 2020-03-18 DIAGNOSIS — J449 Chronic obstructive pulmonary disease, unspecified: Secondary | ICD-10-CM | POA: Insufficient documentation

## 2020-03-18 DIAGNOSIS — E1140 Type 2 diabetes mellitus with diabetic neuropathy, unspecified: Secondary | ICD-10-CM | POA: Insufficient documentation

## 2020-03-18 DIAGNOSIS — E1151 Type 2 diabetes mellitus with diabetic peripheral angiopathy without gangrene: Secondary | ICD-10-CM | POA: Diagnosis not present

## 2020-03-18 DIAGNOSIS — L97529 Non-pressure chronic ulcer of other part of left foot with unspecified severity: Secondary | ICD-10-CM | POA: Diagnosis not present

## 2020-03-18 DIAGNOSIS — D649 Anemia, unspecified: Secondary | ICD-10-CM | POA: Diagnosis not present

## 2020-03-18 DIAGNOSIS — E1122 Type 2 diabetes mellitus with diabetic chronic kidney disease: Secondary | ICD-10-CM | POA: Insufficient documentation

## 2020-03-18 DIAGNOSIS — Z79890 Hormone replacement therapy (postmenopausal): Secondary | ICD-10-CM | POA: Insufficient documentation

## 2020-03-18 DIAGNOSIS — K219 Gastro-esophageal reflux disease without esophagitis: Secondary | ICD-10-CM | POA: Diagnosis not present

## 2020-03-18 DIAGNOSIS — N189 Chronic kidney disease, unspecified: Secondary | ICD-10-CM | POA: Insufficient documentation

## 2020-03-18 HISTORY — PX: ABDOMINAL AORTOGRAM W/LOWER EXTREMITY: CATH118223

## 2020-03-18 LAB — POCT I-STAT, CHEM 8
BUN: 36 mg/dL — ABNORMAL HIGH (ref 8–23)
Calcium, Ion: 1.2 mmol/L (ref 1.15–1.40)
Chloride: 94 mmol/L — ABNORMAL LOW (ref 98–111)
Creatinine, Ser: 6.8 mg/dL — ABNORMAL HIGH (ref 0.61–1.24)
Glucose, Bld: 151 mg/dL — ABNORMAL HIGH (ref 70–99)
HCT: 33 % — ABNORMAL LOW (ref 39.0–52.0)
Hemoglobin: 11.2 g/dL — ABNORMAL LOW (ref 13.0–17.0)
Potassium: 4.8 mmol/L (ref 3.5–5.1)
Sodium: 137 mmol/L (ref 135–145)
TCO2: 31 mmol/L (ref 22–32)

## 2020-03-18 LAB — GLUCOSE, CAPILLARY: Glucose-Capillary: 119 mg/dL — ABNORMAL HIGH (ref 70–99)

## 2020-03-18 LAB — SARS CORONAVIRUS 2 BY RT PCR (HOSPITAL ORDER, PERFORMED IN ~~LOC~~ HOSPITAL LAB): SARS Coronavirus 2: NEGATIVE

## 2020-03-18 SURGERY — ABDOMINAL AORTOGRAM W/LOWER EXTREMITY
Anesthesia: LOCAL | Laterality: Bilateral

## 2020-03-18 MED ORDER — IODIXANOL 320 MG/ML IV SOLN
INTRAVENOUS | Status: DC | PRN
  Administered 2020-03-18: 257 mL via INTRA_ARTERIAL

## 2020-03-18 MED ORDER — SODIUM CHLORIDE 0.9% FLUSH: 3.0000 mL | Freq: Two times a day (BID) | INTRAVENOUS | Status: DC

## 2020-03-18 MED ORDER — HYDRALAZINE HCL 20 MG/ML IJ SOLN: 5.0000 mg | INTRAMUSCULAR | Status: DC | PRN

## 2020-03-18 MED ORDER — HEPARIN (PORCINE) IN NACL 1000-0.9 UT/500ML-% IV SOLN
INTRAVENOUS | Status: AC
  Filled 2020-03-18: qty 1000

## 2020-03-18 MED ORDER — SODIUM CHLORIDE 0.9% FLUSH: 3.0000 mL | INTRAVENOUS | Status: DC | PRN

## 2020-03-18 MED ORDER — ACETAMINOPHEN 325 MG PO TABS: 650.0000 mg | ORAL_TABLET | ORAL | Status: DC | PRN

## 2020-03-18 MED ORDER — LIDOCAINE HCL (PF) 1 % IJ SOLN
INTRAMUSCULAR | Status: DC | PRN
  Administered 2020-03-18: 30 mL

## 2020-03-18 MED ORDER — ONDANSETRON HCL 4 MG/2ML IJ SOLN: 4.0000 mg | Freq: Four times a day (QID) | INTRAMUSCULAR | Status: DC | PRN

## 2020-03-18 MED ORDER — HEPARIN (PORCINE) IN NACL 1000-0.9 UT/500ML-% IV SOLN
INTRAVENOUS | Status: DC | PRN
  Administered 2020-03-18: 500 mL

## 2020-03-18 MED ORDER — MORPHINE SULFATE (PF) 2 MG/ML IV SOLN
INTRAVENOUS | Status: AC
  Filled 2020-03-18: qty 1

## 2020-03-18 MED ORDER — MORPHINE SULFATE (PF) 2 MG/ML IV SOLN
2.0000 mg | INTRAVENOUS | Status: DC | PRN
  Administered 2020-03-18: 2 mg via INTRAVENOUS

## 2020-03-18 MED ORDER — LIDOCAINE HCL (PF) 1 % IJ SOLN
INTRAMUSCULAR | Status: AC
  Filled 2020-03-18: qty 30

## 2020-03-18 MED ORDER — OXYCODONE HCL 5 MG PO TABS
5.0000 mg | ORAL_TABLET | ORAL | Status: DC | PRN
  Administered 2020-03-18: 10 mg via ORAL
  Filled 2020-03-18: qty 2

## 2020-03-18 MED ORDER — LABETALOL HCL 5 MG/ML IV SOLN: 10.0000 mg | INTRAVENOUS | Status: DC | PRN

## 2020-03-18 SURGICAL SUPPLY — 11 items
CATH ANGIO 5F BER2 65CM (CATHETERS) ×2 IMPLANT
CATH ANGIO 5F PIGTAIL 65CM (CATHETERS) ×2 IMPLANT
KIT PV (KITS) ×2 IMPLANT
SHEATH PINNACLE 5F 10CM (SHEATH) ×2 IMPLANT
SHEATH PINNACLE 6F 10CM (SHEATH) ×2 IMPLANT
SHEATH PROBE COVER 6X72 (BAG) ×2 IMPLANT
SYR MEDRAD MARK V 150ML (SYRINGE) ×2 IMPLANT
TRANSDUCER W/STOPCOCK (MISCELLANEOUS) ×2 IMPLANT
TRAY PV CATH (CUSTOM PROCEDURE TRAY) ×2 IMPLANT
WIRE HITORQ VERSACORE ST 145CM (WIRE) ×2 IMPLANT
WIRE ROSEN-J .035X180CM (WIRE) ×2 IMPLANT

## 2020-03-18 NOTE — Progress Notes (Signed)
Site area: Right groin a 5 french arterial sheath was removed  Site Prior to Removal:  Level 0  Pressure Applied For 20 MINUTES    Bedrest Beginning at 1245p  Manual:   Yes.    Patient Status During Pull:  stable  Post Pull Groin Site:  Level 0  Post Pull Instructions Given:  Yes.    Post Pull Pulses Present:  Yes.    Dressing Applied:  Yes.    Comments:

## 2020-03-18 NOTE — Interval H&P Note (Signed)
History and Physical Interval Note:  03/18/2020 10:40 AM  [name]  has presented today for surgery, with the diagnosis of PAD.  The various methods of treatment have been discussed with the patient and family. After consideration of risks, benefits and other options for treatment, the patient has consented to  Procedure(s): ABDOMINAL AORTOGRAM W/LOWER EXTREMITY (Bilateral) as a surgical intervention.  The patient's history has been reviewed, patient examined, no change in status, stable for surgery.  I have reviewed the patient's chart and labs.  Questions were answered to the patient's satisfaction.     [name]

## 2020-03-18 NOTE — Progress Notes (Signed)
Lower extremity vein mapping has been completed.   Preliminary results in CV Proc.   Abram Sander 03/18/2020 3:36 PM

## 2020-03-18 NOTE — Op Note (Signed)
Procedure: Ultrasound right groin, abdominal aortogram left lower extremity runoff  Preoperative diagnosis: Nonhealing wound left foot  Postoperative diagnosis: Same  Anesthesia: Local  Operative findings: #1 diffuse calcific 75% stenosis left common femoral artery  2.  Severely calcified lower extremity arteries  3.  Small abdominal aortic aneurysm just above the bifurcation  4.  Occlusion left superficial femoral and above-knee popliteal artery  5.  Below-knee popliteal artery with primary runoff via the posterior tibial foot diminutive and patent peroneal and patent posterior tibial artery  Operative details: After team informed consent, the patient taken the [place] lab.  The patient placed supine position on the angio table.  Both groins were prepped and draped in usual sterile fashion.  Local anesthesia was infiltrated of the right common femoral artery.  Due to the patient's obesity I decided to stick the patient's artery under fluoroscopy rather than using ultrasound guidance.  The patient had severely calcified arteries and it was very easy to identify the area of the femoral bifurcation.  After infiltrating local anesthesia over this an introducer needle was used to cannulate the right common femoral artery and an 035 Rosen wire advanced into the right iliac system under fluoroscopic guidance.  5 French sheath was placed over the guidewire in the right common femoral artery.  5 French sheath was thoroughly flushed with heparinized saline.  Contrast angiogram was obtained to show that we were within the common femoral artery and the needlestick was over the common femoral head.  At this point the [facility] wire was removed and replaced with a 5 [place] Berenstein catheter over an 035 versa core wire and this was advanced into the abdominal aorta using directional catheter to get past calcific disease.  5 French pigtail catheter was advanced over the guidewire in the abdominal aorta abdominal  aortogram was obtained in [facility] projection.  Infrarenal abdominal aorta is severely calcified but patent.  There is a an aortic aneurysm just above the aortic bifurcation estimated about 3-1/2 to 4 cm diameter.  Left and right common external and internal iliac arteries are patent but heavily calcified.  Next pigtail catheter was pulled down distal to the aortic bifurcation and bilateral oblique views of the pelvis were obtained to confirm the above findings.  Also noted is severe common femoral artery disease with about a 70% stenosis of the origin of the superficial femoral artery on the right side and a 50 to 70% stenosis of the distal right common femoral artery.  Next left lower extremity runoff views were obtained through the pigtail catheter.  The left common femoral artery has severe calcific disease with multiple segments of exophytic calcified plaque causing a 70% obstruction.  The profunda has about a 50% stenosis at its origin.  The left superficial femoral artery occludes in the mid leg.  The above-knee popliteal artery is occluded.  The below-knee popliteal artery does reconstitute via profunda collaterals.  There is three-vessel runoff but difficult to determine distal extent due to motion artifact.  The proximal anterior tibial posterior tibial peroneal arteries are patent.  The peroneal artery is diminutive.  At this point we tried to obtain additional views of the tibial vessels with selective individual shots but again there was severe motion artifact.  The patient's primary runoff vessel is the posterior tibial artery.  The anterior tibial artery does seem to fill the dorsalis pedis eventually.  The peroneal again is diminutive.  At this point pigtail catheter was removed over guidewire.  The 5 [place] sheath  was then placed to be pulled in the holding area.  Patient tolerated procedure well and there were no complications.  Operative management: The patient will be scheduled on April 05, 2020 for a left common femoral endarterectomy and left femoral to below-knee popliteal bypass.  We will obtain vein mapping ultrasound today while he is on bedrest.  Risk benefits possible complications of procedure details including not limited to bleeding infection myocardial events eventual limb loss were all discussed with the patient and his wife today.  He understands and agrees to proceed.  [name], MD [facility] [place] Office: [phone]   At this point

## 2020-03-18 NOTE — Discharge Instructions (Signed)
Angiogram, Care After This sheet gives you information about how to care for yourself after your procedure. Your health care provider may also give you more specific instructions. If you have problems or questions, contact your health care provider. What can I expect after the procedure? After the procedure, it is common to have bruising and tenderness at the catheter insertion area. Follow these instructions at home: Insertion site care  Follow instructions from your health care provider about how to take care of your insertion site. Make sure you: ? Wash your hands with soap and water before you change your bandage (dressing). If soap and water are not available, use hand sanitizer. ? Change your dressing as told by your health care provider. ? Leave stitches (sutures), skin glue, or adhesive strips in place. These skin closures may need to stay in place for 2 weeks or longer. If adhesive strip edges start to loosen and curl up, you may trim the loose edges. Do not remove adhesive strips completely unless your health care provider tells you to do that.  Do not take baths, swim, or use a hot tub until your health care provider approves.  You may shower 24-48 hours after the procedure or as told by your health care provider. ? Gently wash the site with plain soap and water. ? Pat the area dry with a clean towel. ? Do not rub the site. This may cause bleeding.  Do not apply powder or lotion to the site. Keep the site clean and dry.  Check your insertion site every day for signs of infection. Check for: ? Redness, swelling, or pain. ? Fluid or blood. ? Warmth. ? Pus or a bad smell. Activity  Rest as told by your health care provider, usually for 1-2 days.  Do not lift anything that is heavier than 10 lbs. (4.5 kg) or as told by your health care provider.  Do not drive for 24 hours if you were given a medicine to help you relax (sedative).  Do not drive or use heavy machinery while  taking prescription pain medicine. General instructions   Return to your normal activities as told by your health care provider, usually in about a week. Ask your health care provider what activities are safe for you.  If the catheter site starts bleeding, lie flat and put pressure on the site. If the bleeding does not stop, get help right away. This is a medical emergency.  Drink enough fluid to keep your urine clear or pale yellow. This helps flush the contrast dye from your body.  Take over-the-counter and prescription medicines only as told by your health care provider.  Keep all follow-up visits as told by your health care provider. This is important. Contact a health care provider if:  You have a fever or chills.  You have redness, swelling, or pain around your insertion site.  You have fluid or blood coming from your insertion site.  The insertion site feels warm to the touch.  You have pus or a bad smell coming from your insertion site.  You have bruising around the insertion site.  You notice blood collecting in the tissue around the catheter site (hematoma). The hematoma may be painful to the touch. Get help right away if:  You have severe pain at the catheter insertion area.  The catheter insertion area swells very fast.  The catheter insertion area is bleeding, and the bleeding does not stop when you hold steady pressure on the area.    The area near or just beyond the catheter insertion site becomes pale, cool, tingly, or numb. These symptoms may represent a serious problem that is an emergency. Do not wait to see if the symptoms will go away. Get medical help right away. Call your local emergency services (911 in the [place] Do not drive yourself to the hospital. Summary  After the procedure, it is common to have bruising and tenderness at the catheter insertion area.  After the procedure, it is important to rest and drink plenty of fluids.  Do not take baths,  swim, or use a hot tub until your health care provider says it is okay to do so. You may shower 24-48 hours after the procedure or as told by your health care provider.  If the catheter site starts bleeding, lie flat and put pressure on the site. If the bleeding does not stop, get help right away. This is a medical emergency. This information is not intended to replace advice given to you by your health care provider. Make sure you discuss any questions you have with your health care provider. Document Revised: 07/12/2017 Document Reviewed: 07/04/2016 Elsevier Patient Education  2020 Elsevier Inc.  

## 2020-03-19 DIAGNOSIS — N186 End stage renal disease: Secondary | ICD-10-CM | POA: Diagnosis not present

## 2020-03-19 DIAGNOSIS — D509 Iron deficiency anemia, unspecified: Secondary | ICD-10-CM | POA: Diagnosis not present

## 2020-03-19 DIAGNOSIS — N2581 Secondary hyperparathyroidism of renal origin: Secondary | ICD-10-CM | POA: Diagnosis not present

## 2020-03-19 DIAGNOSIS — Z992 Dependence on renal dialysis: Secondary | ICD-10-CM | POA: Diagnosis not present

## 2020-03-19 DIAGNOSIS — D631 Anemia in chronic kidney disease: Secondary | ICD-10-CM | POA: Diagnosis not present

## 2020-03-21 ENCOUNTER — Encounter (HOSPITAL_COMMUNITY): Payer: Self-pay | Admitting: Vascular Surgery

## 2020-03-22 ENCOUNTER — Telehealth: Payer: Self-pay | Admitting: Cardiovascular Disease

## 2020-03-22 ENCOUNTER — Other Ambulatory Visit: Payer: Self-pay

## 2020-03-22 DIAGNOSIS — D509 Iron deficiency anemia, unspecified: Secondary | ICD-10-CM | POA: Diagnosis not present

## 2020-03-22 DIAGNOSIS — N186 End stage renal disease: Secondary | ICD-10-CM | POA: Diagnosis not present

## 2020-03-22 DIAGNOSIS — Z992 Dependence on renal dialysis: Secondary | ICD-10-CM | POA: Diagnosis not present

## 2020-03-22 DIAGNOSIS — N2581 Secondary hyperparathyroidism of renal origin: Secondary | ICD-10-CM | POA: Diagnosis not present

## 2020-03-22 DIAGNOSIS — D631 Anemia in chronic kidney disease: Secondary | ICD-10-CM | POA: Diagnosis not present

## 2020-03-22 NOTE — Telephone Encounter (Signed)
   [facility] Pre-operative Risk Assessment    Request for surgical clearance:  1. What type of surgery is being performed? Left Common Femoral Endarterectomy With Left Femoral To Below Knee Popliteal Bypass  2. When is this surgery scheduled? 04/05/20   3. What type of clearance is required (medical clearance vs. Pharmacy clearance to hold med vs. Both)? Medical Clearance  4. Are there any medications that need to be held prior to surgery and how long? No   5. Practice name and name of physician performing surgery? [facility]  Dr. [name]   6. What is your office phone number? [phone] (ask for [facility] or [place])    7.   What is your office fax number? [phone]  8.   Anesthesia type (None, local, MAC, general) ? Jeani Hawking with [facility] states she has not been informed of the type of anesthesia that will be used.   Zara Council 03/22/2020, 9:55 AM  _________________________________________________________________   (provider comments below)

## 2020-03-22 NOTE — Telephone Encounter (Signed)
Patient returned your call.

## 2020-03-22 NOTE — Telephone Encounter (Signed)
Pt in dialysis. He will call back. I will review clearance with DOD.  Kerin Ransom PA-C 03/22/2020 10:46 AM

## 2020-03-23 DIAGNOSIS — L97421 Non-pressure chronic ulcer of left heel and midfoot limited to breakdown of skin: Secondary | ICD-10-CM | POA: Diagnosis not present

## 2020-03-23 DIAGNOSIS — E1122 Type 2 diabetes mellitus with diabetic chronic kidney disease: Secondary | ICD-10-CM | POA: Diagnosis not present

## 2020-03-23 DIAGNOSIS — E11621 Type 2 diabetes mellitus with foot ulcer: Secondary | ICD-10-CM | POA: Diagnosis not present

## 2020-03-23 DIAGNOSIS — I120 Hypertensive chronic kidney disease with stage 5 chronic kidney disease or end stage renal disease: Secondary | ICD-10-CM | POA: Diagnosis not present

## 2020-03-23 DIAGNOSIS — S81812D Laceration without foreign body, left lower leg, subsequent encounter: Secondary | ICD-10-CM | POA: Diagnosis not present

## 2020-03-23 DIAGNOSIS — S81802D Unspecified open wound, left lower leg, subsequent encounter: Secondary | ICD-10-CM | POA: Diagnosis not present

## 2020-03-24 DIAGNOSIS — N2581 Secondary hyperparathyroidism of renal origin: Secondary | ICD-10-CM | POA: Diagnosis not present

## 2020-03-24 DIAGNOSIS — N186 End stage renal disease: Secondary | ICD-10-CM | POA: Diagnosis not present

## 2020-03-24 DIAGNOSIS — Z992 Dependence on renal dialysis: Secondary | ICD-10-CM | POA: Diagnosis not present

## 2020-03-24 DIAGNOSIS — D631 Anemia in chronic kidney disease: Secondary | ICD-10-CM | POA: Diagnosis not present

## 2020-03-24 DIAGNOSIS — D509 Iron deficiency anemia, unspecified: Secondary | ICD-10-CM | POA: Diagnosis not present

## 2020-03-26 DIAGNOSIS — N2581 Secondary hyperparathyroidism of renal origin: Secondary | ICD-10-CM | POA: Diagnosis not present

## 2020-03-26 DIAGNOSIS — D509 Iron deficiency anemia, unspecified: Secondary | ICD-10-CM | POA: Diagnosis not present

## 2020-03-26 DIAGNOSIS — Z992 Dependence on renal dialysis: Secondary | ICD-10-CM | POA: Diagnosis not present

## 2020-03-26 DIAGNOSIS — D631 Anemia in chronic kidney disease: Secondary | ICD-10-CM | POA: Diagnosis not present

## 2020-03-26 DIAGNOSIS — N186 End stage renal disease: Secondary | ICD-10-CM | POA: Diagnosis not present

## 2020-03-29 ENCOUNTER — Telehealth: Payer: Self-pay

## 2020-03-29 ENCOUNTER — Other Ambulatory Visit: Payer: Self-pay | Admitting: Physician Assistant

## 2020-03-29 DIAGNOSIS — D509 Iron deficiency anemia, unspecified: Secondary | ICD-10-CM | POA: Diagnosis not present

## 2020-03-29 DIAGNOSIS — N2581 Secondary hyperparathyroidism of renal origin: Secondary | ICD-10-CM | POA: Diagnosis not present

## 2020-03-29 DIAGNOSIS — D631 Anemia in chronic kidney disease: Secondary | ICD-10-CM | POA: Diagnosis not present

## 2020-03-29 DIAGNOSIS — Z992 Dependence on renal dialysis: Secondary | ICD-10-CM | POA: Diagnosis not present

## 2020-03-29 DIAGNOSIS — Z01810 Encounter for preprocedural cardiovascular examination: Secondary | ICD-10-CM

## 2020-03-29 DIAGNOSIS — I739 Peripheral vascular disease, unspecified: Secondary | ICD-10-CM

## 2020-03-29 DIAGNOSIS — N186 End stage renal disease: Secondary | ICD-10-CM | POA: Diagnosis not present

## 2020-03-29 NOTE — Addendum Note (Signed)
Addended by: Mendel Ryder on: 03/29/2020 04:01 PM   Modules accepted: Level of Service, SmartSet

## 2020-03-29 NOTE — Telephone Encounter (Signed)
Pt is scheduled to have stress test on 04/01/20. Pt aware and instructions were given.

## 2020-03-29 NOTE — Telephone Encounter (Signed)
Lynn from [facility] calling to follow up on surgical clearance. She states they have not heard anything.

## 2020-03-29 NOTE — Telephone Encounter (Addendum)
This encounter was created in error - please disregard.

## 2020-03-29 NOTE — Telephone Encounter (Addendum)
Attempted to reach patient to notify him that he will need a stress test. Per [name], PA-C for patient to have test done at [facility] or [place] depending on available days due to patient having dialysis. Pt is scheduled for surgery on 8/23. Detailed message was left for patient to call back.

## 2020-03-29 NOTE — Telephone Encounter (Signed)
[name] called to follow up on Cardiac Clearance [name] would like a call back with status of pre-op

## 2020-03-29 NOTE — Telephone Encounter (Signed)
   Primary Cardiologist: Kate Sable, MD (Inactive)  Chart reviewed as part of pre-operative protocol coverage. Patient was contacted 03/29/2020 in reference to pre-operative risk assessment for pending surgery as outlined below.   [name] has a hx of insulin dependent diabetes mellitus, ESRD on dialysis (Tues, Thurs, Sat), peripheral arterial disease s/p R AKA, hypertension, hyperlipidemia.  He was last seen on 01/06/20 by Dr. Bronson Ing.  Since that day, [name] has not had chest pain, shortness of breath, syncope.  An echocardiogram in 01/2020 demonstrated normal EF and mild AS (mean 13 mmgHg).    His risk of perioperative major cardiac event is high at 11% according to the revised cardiac risk index (RCRI).  His functional capacity is poor.  He cannot achieve 4 METs.  He is wheelchair bound.  According to ACC/AHA guidelines, he requires functional testing prior to proceeding with his surgical procedure.   Pre-op covering staff: - Please schedule a Lexiscan Myoview ASAP in [place] or [place].  If patient willing to come to [place] and only appt is there, schedule there.  - Surgery is 8/23 or 8/24 (pt says 8/23, notes say 8/24) >> We need test done ASAP to avoid delaying surgery. - Please contact patient to schedule stress test. - Please notify surgeon's office that Myoview is pending.   [name], PA-C 03/29/2020, 2:03 PM

## 2020-03-30 ENCOUNTER — Telehealth (HOSPITAL_COMMUNITY): Payer: Self-pay

## 2020-03-30 ENCOUNTER — Encounter (HOSPITAL_COMMUNITY): Payer: Medicare Other

## 2020-03-30 ENCOUNTER — Other Ambulatory Visit: Payer: Self-pay | Admitting: *Deleted

## 2020-03-30 DIAGNOSIS — L97421 Non-pressure chronic ulcer of left heel and midfoot limited to breakdown of skin: Secondary | ICD-10-CM | POA: Diagnosis not present

## 2020-03-30 DIAGNOSIS — E11621 Type 2 diabetes mellitus with foot ulcer: Secondary | ICD-10-CM | POA: Diagnosis not present

## 2020-03-30 DIAGNOSIS — S81802D Unspecified open wound, left lower leg, subsequent encounter: Secondary | ICD-10-CM | POA: Diagnosis not present

## 2020-03-30 DIAGNOSIS — I120 Hypertensive chronic kidney disease with stage 5 chronic kidney disease or end stage renal disease: Secondary | ICD-10-CM | POA: Diagnosis not present

## 2020-03-30 DIAGNOSIS — S81812D Laceration without foreign body, left lower leg, subsequent encounter: Secondary | ICD-10-CM | POA: Diagnosis not present

## 2020-03-30 DIAGNOSIS — E1122 Type 2 diabetes mellitus with diabetic chronic kidney disease: Secondary | ICD-10-CM | POA: Diagnosis not present

## 2020-03-30 DIAGNOSIS — G932 Benign intracranial hypertension: Secondary | ICD-10-CM | POA: Diagnosis not present

## 2020-03-30 NOTE — Patient Outreach (Signed)
[facility]) Care Management  03/30/2020  [name] [birth year] [id number]   [place] Monthly Outreach  Referral Date: 06/26/2019 Referral Source: Transfer from [place] Reason for Referral: Continued Disease Management Education Insurance: Medicare   Outreach Attempt:  Outreach attempt #6 to patient for follow up. No answer. RN Health Coach left HIPAA compliant voicemail message along with contact information.  Plan:  RN Health Coach will make another outreach attempt within the month of September if no return call back from patient.   [place] [phone] [email or web address]

## 2020-03-30 NOTE — Progress Notes (Signed)
Assessment/Plan:   1.  Possible papilledema, unilateral  -Optometry records indicate that patient had unilateral, right-sided papilledema and sent here to eval IIH.  This would be inconsistent with pseudotumor cerebri, and I am not sure his hx supports that dx.  His neuro imaging is negative.  Before we do lumbar puncture (which would evaluate opening pressure for bilateral papilledema), I really would like him to see ophthalmology for a more detailed examination.   I worry about possible AION.  I called and spoke with Dr. Valetta Close and he is going to arrange to see him asap.  -told patient that I am not sure that we should proceed with  his surgery on Monday until we get an answer on etiology of above.  I did speak with Dr. [name] and he has valid concerns about his foot and being able to keep the foot if the bypass surgery isn't done asap.  The issue, of course, is that surgery is scheduled for Monday (today being Thursday afternoon) and patient is scheduled tomorrow for stress test and Covid testing, which is taking most of his day, limiting the time that he can see Dr. Valetta Close.  We will call back to Dr. Romeo Apple office and see if they can work him in tomorrow.  -we will proceed with carotid u/s 2.  Follow up depending on results of above Subjective:   [name] was seen today in neurologic consultation at the request of Caryl Bis, MD.  The consultation is for the evaluation of possible pseudotumor cerebri.  Records made available to me have been reviewed. Pt accompanied by wife who supplements the hx.    Patient is a 69 year old male who was seen by optometry, Dr. Mayford Knife, OD on March 15, 2020 with complaints of "flashes and floaters involving the right eye."  That has been going on for a few days at that point in time.  On examination, there is reported to be papilledema of the right eye.  There was not reported to be papilledema in the left eye.   Pt states today that 3-4 weeks ago,  he woke up with a dark horizontal line in the center of the left eye x 1 week.  That lasted one week.  He could see above and below the line.  One week later, he noted vision change in the R eye.  It started with a similar line in the vision and then over days, he developed vision blurring, as if he was looking through a thick cloud on the right.  No pain.  He hoped it would go away.  Went to the eye doctor a few days later and "they told me I had fluid build up on the right eye."  He continuous to have the feeling he is looking through a thick cloud.  No black vision/complete loss of vision.  States that he has bypass surgery scheduled in the L leg on Monday with stress test all tomorrow AM and covid testing for that surgery in the afternoon.   ALLERGIES:   Allergies  Allergen Reactions  . Ace Inhibitors     Unknown reaction, tolerates lisinopril   . Clonidine     Unknown reaction   . Morphine And Related Other (See Comments)    Not effective  . Oxytocin     unknown    CURRENT MEDICATIONS:  Outpatient Encounter Medications as of 03/31/2020  Medication Sig  . albuterol (VENTOLIN HFA) 108 (90 Base) MCG/ACT inhaler Inhale 2  puffs into the lungs every 6 (six) hours as needed for wheezing or shortness of breath.   . ARTIFICIAL TEAR OP Place 1 drop into both eyes every 6 (six) hours as needed (dry eyes).  Marland Kitchen atorvastatin (LIPITOR) 80 MG tablet Take 1 tablet (80 mg total) by mouth at bedtime. (Patient taking differently: Take 80 mg by mouth daily. )  . bismuth subsalicylate (PEPTO BISMOL) 262 MG/15ML suspension Take 30 mLs by mouth every 6 (six) hours as needed for indigestion.  . calcium acetate (PHOSLO) 667 MG capsule Take 4 capsules (2,668 mg total) by mouth 3 (three) times daily with meals. (Patient taking differently: Take 1,334-2,001 mg by mouth See admin instructions. Take 2001 mg with meals and 1334 with each snack)  . cinacalcet (SENSIPAR) 30 MG tablet Take 1 tablet (30 mg total) by mouth  every evening.  . clopidogrel (PLAVIX) 75 MG tablet Take 1 tablet (75 mg total) by mouth daily.  . diphenhydrAMINE (BENADRYL) 25 MG tablet Take 25 mg by mouth daily as needed for itching.  . DULoxetine (CYMBALTA) 30 MG capsule Take 30 mg by mouth daily.  Marland Kitchen ibuprofen (ADVIL) 200 MG tablet Take 400 mg by mouth 3 (three) times daily.   Marland Kitchen levothyroxine (SYNTHROID) 125 MCG tablet Take 1 tablet (125 mcg total) by mouth daily before breakfast. (Patient taking differently: Take 125 mcg by mouth at bedtime. )  . lisinopril (ZESTRIL) 10 MG tablet Take 10 mg by mouth 4 (four) times a week. Take 10 mg daily on non dialysis days (Sun, Mon, Wed, and Fri)  . Melatonin 10 MG CAPS Take 10 mg by mouth at bedtime.  . midodrine (PROAMATINE) 10 MG tablet Take 10 mg by mouth Every Tuesday,Thursday,and Saturday with dialysis.  Marland Kitchen multivitamin (RENA-VIT) TABS tablet Take 1 tablet by mouth daily.  . polyethylene glycol (MIRALAX / GLYCOLAX) 17 g packet Take 17 g by mouth daily as needed for moderate constipation.  . pregabalin (LYRICA) 150 MG capsule Take 1 capsule (150 mg total) by mouth 3 (three) times daily.  . sevelamer carbonate (RENVELA) 800 MG tablet Take 1,600-3,200 mg by mouth See admin instructions. Take 3200 mg with each meal and 1600 mg with each snack  . TOUJEO SOLOSTAR 300 UNIT/ML SOPN Inject 54-78 Units as directed at bedtime as needed (if blood sugar is 130 or higher).   . [DISCONTINUED] HYDROcodone-acetaminophen (NORCO/VICODIN) 5-325 MG tablet Take 1 tablet by mouth 4 (four) times daily. (Patient not taking: Reported on 03/31/2020)  . [DISCONTINUED] Polyvinyl Alcohol (LIQUID TEARS OP) Place 1 drop into both eyes daily as needed (dry eyes). (Patient not taking: Reported on 03/31/2020)   Facility-Administered Encounter Medications as of 03/31/2020  Medication  . 0.9 %  sodium chloride infusion    Objective:   PHYSICAL EXAMINATION:    VITALS:   Vitals:   03/31/20 1329  BP: 122/65  Pulse: 71  SpO2: 96%   Weight: 244 lb 14.9 oz (111.1 kg)  Height: 6\' 4"  (1.93 m)    GEN:  Normal appears male in no acute distress.  Appears stated age. HEENT:  Normocephalic, atraumatic. The mucous membranes are moist. The superficial temporal arteries are without ropiness or tenderness. Cardiovascular: Regular rate and rhythm with 3/6 sem. Lungs: Clear to auscultation bilaterally. Neck/Heme: There are no carotid bruits noted bilaterally.  NEUROLOGICAL: Orientation:  The patient is alert and oriented x 3.   Cranial nerves: There is good facial symmetry.  Extraocular muscles are intact and visual fields are full to confrontational testing.  No deficits noted with VF testing.   Fundi not well visualized bilaterally.  Speech is fluent and clear. Soft palate rises symmetrically and there is no tongue deviation. Hearing is intact to conversational tone. Tone: Tone is good throughout. Sensation: Sensation is intact to light touch throughout Coordination:  The patient has no difficulty with RAM's or FNF bilaterally. Motor: Strength is 5/5 in the bilateral upper and lower extremities (has R AKA).  Shoulder shrug is equal and symmetric. There is no pronator drift.  There are no fasciculations noted. Gait and Station: not tested    Total time spent on today's visit was 70 minutes, including both face-to-face time and nonface-to-face time.  Time included that spent on review of records (prior notes available to me/labs/imaging if pertinent), discussing treatment and goals, answering patient's questions and coordinating care.   Cc:  Caryl Bis, MD

## 2020-03-30 NOTE — Telephone Encounter (Signed)
Encounter complete. 

## 2020-03-31 ENCOUNTER — Other Ambulatory Visit: Payer: Self-pay

## 2020-03-31 ENCOUNTER — Telehealth: Payer: Self-pay

## 2020-03-31 ENCOUNTER — Ambulatory Visit (INDEPENDENT_AMBULATORY_CARE_PROVIDER_SITE_OTHER): Payer: Medicare Other | Admitting: Neurology

## 2020-03-31 ENCOUNTER — Inpatient Hospital Stay (HOSPITAL_COMMUNITY): Admission: RE | Admit: 2020-03-31 | Payer: Medicare Other | Source: Ambulatory Visit

## 2020-03-31 ENCOUNTER — Encounter: Payer: Self-pay | Admitting: Neurology

## 2020-03-31 VITALS — BP 122/65 | HR 71 | Ht 76.0 in | Wt 244.9 lb

## 2020-03-31 DIAGNOSIS — H539 Unspecified visual disturbance: Secondary | ICD-10-CM

## 2020-03-31 DIAGNOSIS — N186 End stage renal disease: Secondary | ICD-10-CM | POA: Diagnosis not present

## 2020-03-31 DIAGNOSIS — Z992 Dependence on renal dialysis: Secondary | ICD-10-CM | POA: Diagnosis not present

## 2020-03-31 DIAGNOSIS — H4710 Unspecified papilledema: Secondary | ICD-10-CM

## 2020-03-31 DIAGNOSIS — D631 Anemia in chronic kidney disease: Secondary | ICD-10-CM | POA: Diagnosis not present

## 2020-03-31 DIAGNOSIS — N2581 Secondary hyperparathyroidism of renal origin: Secondary | ICD-10-CM | POA: Diagnosis not present

## 2020-03-31 DIAGNOSIS — D509 Iron deficiency anemia, unspecified: Secondary | ICD-10-CM | POA: Diagnosis not present

## 2020-03-31 NOTE — Telephone Encounter (Signed)
Telephone call placed to pt to inform of arrival time change for procedure on 04/04/20 is now 745AM instead of 530AM. Requested pt to callback to confirm.

## 2020-03-31 NOTE — Progress Notes (Signed)
[place], [place] [address] [place] [address] Phone: [phone] Fax: [phone]      Your procedure is scheduled on Monday, August 23rd.  Report to [facility] Main Entrance "A" at 5:30 A.M., and check in at the Admitting office.  Call this number if you have problems the morning of surgery:  [phone]  Call [phone] if you have any questions prior to your surgery date Monday-Friday 8am-4pm    Remember:  Do not eat or drink after midnight the night before your surgery     Take these medicines the morning of surgery with A SIP OF WATER   Albuterol inhaler - if needed  Eye drops - if needed  Atorvastatin (Lipitor)  Duloxetine (Cymbalta)  Hydrocodone-Acetaminophen - if needed  Lyrica    Follow your surgeon's instructions on when to stop Plavix.  If no instructions were given by your surgeon then you will need to call the office to get those instructions.     As of today, STOP taking any Aspirin (unless otherwise instructed by your surgeon) Aleve, Naproxen, Ibuprofen, Motrin, Advil, Goody's, BC's, all herbal medications, fish oil, and all vitamins.   WHAT DO I DO ABOUT MY DIABETES MEDICATION?   . THE NIGHT BEFORE SURGERY, take 1/2 of your dose of Toujeo       HOW TO MANAGE YOUR DIABETES BEFORE AND AFTER SURGERY  Why is it important to control my blood sugar before and after surgery? . Improving blood sugar levels before and after surgery helps healing and can limit problems. . A way of improving blood sugar control is eating a healthy diet by: o  Eating less sugar and carbohydrates o  Increasing activity/exercise o  Talking with your doctor about reaching your blood sugar goals . High blood sugars (greater than 180 mg/dL) can raise your risk of infections and slow your recovery, so you will need to focus on controlling your diabetes during the weeks before surgery. . Make sure that the doctor who takes care of your  diabetes knows about your planned surgery including the date and location.  How do I manage my blood sugar before surgery? . Check your blood sugar at least 4 times a day, starting 2 days before surgery, to make sure that the level is not too high or low. . Check your blood sugar the morning of your surgery when you wake up and every 2 hours until you get to the Short Stay unit. o If your blood sugar is less than 70 mg/dL, you will need to treat for low blood sugar: - Do not take insulin. - Treat a low blood sugar (less than 70 mg/dL) with  cup of clear juice (cranberry or apple), 4 glucose tablets, OR glucose gel. - Recheck blood sugar in 15 minutes after treatment (to make sure it is greater than 70 mg/dL). If your blood sugar is not greater than 70 mg/dL on recheck, call [phone] for further instructions. . Report your blood sugar to the short stay nurse when you get to Short Stay.  . If you are admitted to the hospital after surgery: o Your blood sugar will be checked by the staff and you will probably be given insulin after surgery (instead of oral diabetes medicines) to make sure you have good blood sugar levels. o The goal for blood sugar control after surgery is 80-180 mg/dL.  Day of Surgery:                    Do not wear jewelry            Do not wear lotions, powders, colognes, or deodorant.            Men may shave face and neck.            Do not bring valuables to the hospital.            [facility] is not responsible for any belongings or valuables.  Do NOT Smoke (Tobacco/Vaping) or drink Alcohol 24 hours prior to your procedure If you use a CPAP at night, you may bring all equipment for your overnight stay.   Contacts, glasses, dentures or bridgework may not be worn into surgery.      For patients admitted to the hospital, discharge time will be determined by your treatment team.   Patients discharged the day of surgery will not be allowed to drive  home, and someone needs to stay with them for 24 hours.    Special instructions:   - Preparing For Surgery  Before surgery, you can play an important role. Because skin is not sterile, your skin needs to be as free of germs as possible. You can reduce the number of germs on your skin by washing with CHG (chlorahexidine gluconate) Soap before surgery.  CHG is an antiseptic cleaner which kills germs and bonds with the skin to continue killing germs even after washing.    Oral Hygiene is also important to reduce your risk of infection.  Remember - BRUSH YOUR TEETH THE MORNING OF SURGERY WITH YOUR REGULAR TOOTHPASTE  Please do not use if you have an allergy to CHG or antibacterial soaps. If your skin becomes reddened/irritated stop using the CHG.  Do not shave (including legs and underarms) for at least 48 hours prior to first CHG shower. It is OK to shave your face.  Please follow these instructions carefully.   1. Shower the NIGHT BEFORE SURGERY and the MORNING OF SURGERY with CHG Soap.   2. If you chose to wash your hair, wash your hair first as usual with your normal shampoo.  3. After you shampoo, rinse your hair and body thoroughly to remove the shampoo.  4. Use CHG as you would any other liquid soap. You can apply CHG directly to the skin and wash gently with a scrungie or a clean washcloth.   5. Apply the CHG Soap to your body ONLY FROM THE NECK DOWN.  Do not use on open wounds or open sores. Avoid contact with your eyes, ears, mouth and genitals (private parts). Wash Face and genitals (private parts)  with your normal soap.   6. Wash thoroughly, paying special attention to the area where your surgery will be performed.  7. Thoroughly rinse your body with warm water from the neck down.  8. DO NOT shower/wash with your normal soap after using and rinsing off the CHG Soap.  9. Pat yourself dry with a CLEAN TOWEL.  10. Wear CLEAN PAJAMAS to bed the night before  surgery  11. Place CLEAN SHEETS on your bed the night of your first shower and DO NOT SLEEP WITH PETS.   Day of Surgery: Wear Clean/Comfortable clothing the morning of surgery Do not apply any deodorants/lotions.   Remember to brush your teeth WITH YOUR REGULAR TOOTHPASTE.   Please read over the following  fact sheets that you were given.

## 2020-04-01 ENCOUNTER — Telehealth: Payer: Self-pay

## 2020-04-01 ENCOUNTER — Ambulatory Visit (HOSPITAL_BASED_OUTPATIENT_CLINIC_OR_DEPARTMENT_OTHER)
Admission: RE | Admit: 2020-04-01 | Discharge: 2020-04-01 | Disposition: A | Discharge status: Home / Self Care | Payer: Medicare Other | Source: Ambulatory Visit | Attending: Physician Assistant | Admitting: Physician Assistant

## 2020-04-01 ENCOUNTER — Encounter: Payer: Self-pay | Admitting: Physician Assistant

## 2020-04-01 ENCOUNTER — Encounter (HOSPITAL_COMMUNITY): Payer: Self-pay

## 2020-04-01 ENCOUNTER — Other Ambulatory Visit: Payer: Self-pay

## 2020-04-01 ENCOUNTER — Other Ambulatory Visit (HOSPITAL_COMMUNITY)
Admission: RE | Admit: 2020-04-01 | Discharge: 2020-04-01 | Disposition: A | Discharge status: Home / Self Care | Payer: Medicare Other | Source: Ambulatory Visit | Attending: Vascular Surgery | Admitting: Vascular Surgery

## 2020-04-01 ENCOUNTER — Ambulatory Visit (HOSPITAL_BASED_OUTPATIENT_CLINIC_OR_DEPARTMENT_OTHER)
Admission: RE | Admit: 2020-04-01 | Discharge: 2020-04-01 | Disposition: A | Discharge status: Home / Self Care | Payer: Medicare Other | Source: Ambulatory Visit | Attending: Neurology | Admitting: Neurology

## 2020-04-01 ENCOUNTER — Encounter (HOSPITAL_COMMUNITY)
Admission: RE | Admit: 2020-04-01 | Discharge: 2020-04-01 | Disposition: A | Discharge status: Home / Self Care | Payer: Medicare Other | Source: Ambulatory Visit | Attending: Vascular Surgery | Admitting: Vascular Surgery

## 2020-04-01 DIAGNOSIS — Z20822 Contact with and (suspected) exposure to covid-19: Secondary | ICD-10-CM | POA: Insufficient documentation

## 2020-04-01 DIAGNOSIS — E785 Hyperlipidemia, unspecified: Secondary | ICD-10-CM | POA: Insufficient documentation

## 2020-04-01 DIAGNOSIS — H539 Unspecified visual disturbance: Secondary | ICD-10-CM

## 2020-04-01 DIAGNOSIS — K219 Gastro-esophageal reflux disease without esophagitis: Secondary | ICD-10-CM | POA: Insufficient documentation

## 2020-04-01 DIAGNOSIS — Z89611 Acquired absence of right leg above knee: Secondary | ICD-10-CM | POA: Insufficient documentation

## 2020-04-01 DIAGNOSIS — I739 Peripheral vascular disease, unspecified: Secondary | ICD-10-CM | POA: Insufficient documentation

## 2020-04-01 DIAGNOSIS — I2510 Atherosclerotic heart disease of native coronary artery without angina pectoris: Secondary | ICD-10-CM | POA: Insufficient documentation

## 2020-04-01 DIAGNOSIS — Z992 Dependence on renal dialysis: Secondary | ICD-10-CM | POA: Insufficient documentation

## 2020-04-01 DIAGNOSIS — R011 Cardiac murmur, unspecified: Secondary | ICD-10-CM | POA: Insufficient documentation

## 2020-04-01 DIAGNOSIS — I517 Cardiomegaly: Secondary | ICD-10-CM | POA: Insufficient documentation

## 2020-04-01 DIAGNOSIS — Z01818 Encounter for other preprocedural examination: Secondary | ICD-10-CM | POA: Insufficient documentation

## 2020-04-01 DIAGNOSIS — N186 End stage renal disease: Secondary | ICD-10-CM | POA: Insufficient documentation

## 2020-04-01 DIAGNOSIS — Z794 Long term (current) use of insulin: Secondary | ICD-10-CM | POA: Insufficient documentation

## 2020-04-01 DIAGNOSIS — E1122 Type 2 diabetes mellitus with diabetic chronic kidney disease: Secondary | ICD-10-CM | POA: Insufficient documentation

## 2020-04-01 DIAGNOSIS — E1142 Type 2 diabetes mellitus with diabetic polyneuropathy: Secondary | ICD-10-CM | POA: Insufficient documentation

## 2020-04-01 DIAGNOSIS — Z7902 Long term (current) use of antithrombotics/antiplatelets: Secondary | ICD-10-CM | POA: Insufficient documentation

## 2020-04-01 DIAGNOSIS — I120 Hypertensive chronic kidney disease with stage 5 chronic kidney disease or end stage renal disease: Secondary | ICD-10-CM | POA: Insufficient documentation

## 2020-04-01 DIAGNOSIS — Z01810 Encounter for preprocedural cardiovascular examination: Secondary | ICD-10-CM

## 2020-04-01 DIAGNOSIS — J449 Chronic obstructive pulmonary disease, unspecified: Secondary | ICD-10-CM | POA: Insufficient documentation

## 2020-04-01 DIAGNOSIS — D649 Anemia, unspecified: Secondary | ICD-10-CM | POA: Insufficient documentation

## 2020-04-01 HISTORY — DX: Hypothyroidism, unspecified: E03.9

## 2020-04-01 HISTORY — DX: Atherosclerotic heart disease of native coronary artery without angina pectoris: I25.10

## 2020-04-01 LAB — COMPREHENSIVE METABOLIC PANEL
ALT: 17 U/L (ref 0–44)
AST: 19 U/L (ref 15–41)
Albumin: 3.7 g/dL (ref 3.5–5.0)
Alkaline Phosphatase: 65 U/L (ref 38–126)
Anion gap: 15 (ref 5–15)
BUN: 41 mg/dL — ABNORMAL HIGH (ref 8–23)
CO2: 28 mmol/L (ref 22–32)
Calcium: 10.1 mg/dL (ref 8.9–10.3)
Chloride: 99 mmol/L (ref 98–111)
Creatinine, Ser: 7.89 mg/dL — ABNORMAL HIGH (ref 0.61–1.24)
GFR calc Af Amer: 7 mL/min — ABNORMAL LOW (ref >60)
GFR calc non Af Amer: 6 mL/min — ABNORMAL LOW (ref >60)
Glucose, Bld: 94 mg/dL (ref 70–99)
Potassium: 4.8 mmol/L (ref 3.5–5.1)
Sodium: 142 mmol/L (ref 135–145)
Total Bilirubin: 0.3 mg/dL (ref 0.3–1.2)
Total Protein: 6.7 g/dL (ref 6.5–8.1)

## 2020-04-01 LAB — SURGICAL PCR SCREEN
MRSA, PCR: NEGATIVE
Staphylococcus aureus: NEGATIVE

## 2020-04-01 LAB — TYPE AND SCREEN
ABO/RH(D): A POS
Antibody Screen: NEGATIVE

## 2020-04-01 LAB — CBC
HCT: 32.8 % — ABNORMAL LOW (ref 39.0–52.0)
Hemoglobin: 10.8 g/dL — ABNORMAL LOW (ref 13.0–17.0)
MCH: 35.1 pg — ABNORMAL HIGH (ref 26.0–34.0)
MCHC: 32.9 g/dL (ref 30.0–36.0)
MCV: 106.5 fL — ABNORMAL HIGH (ref 80.0–100.0)
Platelets: 165 10*3/uL (ref 150–400)
RBC: 3.08 MIL/uL — ABNORMAL LOW (ref 4.22–5.81)
RDW: 14.9 % (ref 11.5–15.5)
WBC: 5.8 10*3/uL (ref 4.0–10.5)
nRBC: 0 % (ref 0.0–0.2)

## 2020-04-01 LAB — MYOCARDIAL PERFUSION IMAGING
LV dias vol: 261 mL (ref 62–150)
LV sys vol: 160 mL
Peak HR: 74 {beats}/min
Rest HR: 59 {beats}/min
SDS: 1
SRS: 7
SSS: 8
TID: 1.15

## 2020-04-01 LAB — APTT: aPTT: 36 seconds (ref 24–36)

## 2020-04-01 LAB — GLUCOSE, CAPILLARY: Glucose-Capillary: 101 mg/dL — ABNORMAL HIGH (ref 70–99)

## 2020-04-01 LAB — PROTIME-INR
INR: 1.2 (ref 0.8–1.2)
Prothrombin Time: 14.4 seconds (ref 11.4–15.2)

## 2020-04-01 LAB — HEMOGLOBIN A1C
Hgb A1c MFr Bld: 6.1 % — ABNORMAL HIGH (ref 4.8–5.6)
Mean Plasma Glucose: 128.37 mg/dL

## 2020-04-01 LAB — SARS CORONAVIRUS 2 (TAT 6-24 HRS): SARS Coronavirus 2: NEGATIVE

## 2020-04-01 MED ORDER — TECHNETIUM TC 99M TETROFOSMIN IV KIT
32.0000 | PACK | Freq: Once | INTRAVENOUS | Status: AC | PRN
  Administered 2020-04-01: 32 via INTRAVENOUS
  Filled 2020-04-01: qty 32

## 2020-04-01 MED ORDER — TECHNETIUM TC 99M TETROFOSMIN IV KIT
9.5000 | PACK | Freq: Once | INTRAVENOUS | Status: AC | PRN
  Administered 2020-04-01: 9.5 via INTRAVENOUS
  Filled 2020-04-01: qty 10

## 2020-04-01 MED ORDER — REGADENOSON 0.4 MG/5ML IV SOLN
0.4000 mg | Freq: Once | INTRAVENOUS | Status: AC
Start: 2020-04-01 — End: 2020-04-01
  Administered 2020-04-01: 0.4 mg via INTRAVENOUS

## 2020-04-01 NOTE — Telephone Encounter (Signed)
-----  Message from [place], DO sent at 04/01/2020  7:58 AM EDT ----- Call patient and ask him if having pre-op labs anywhere today.  If so, add ESR.  If not, ask him if we can draw one today (ask him if he has any labs not drawn off of the dialysis cath).  He may not be available to answer phone until 11 or so

## 2020-04-01 NOTE — Telephone Encounter (Signed)
  Myoview 04/01/2020 demonstrates large inferior scar but no ischemia.  EF is 39 but EF on echocardiogram in 6/21 was normal.   The patient may proceed with his surgery at elevated but acceptable risk.  No further testing is needed. [name], PA-C    04/01/2020 3:51 PM

## 2020-04-01 NOTE — Progress Notes (Signed)
Anesthesia Chart Review:  Pertinent history includes insulin-dependent diabetes mellitus, end-stage renal disease on hemodialysis T Th S, hypertension, COPD, GERD, hyperlipidemia, lower extremity neuropathy, and peripheral vascular disease and is status post right AKA.   Recently referred to cardiology by PCP for dysrhythmia.  Seen by Dr. Bronson Ing 01/06/2020.  Per his note patient had ECG demonstrating ventricular bigeminy.  He was also noted to have a murmur on exam.  Echo was ordered which showed EF 55 to 60%, grade 1 DD, mild AS with mean gradient 13 mmHg.  Per cardiac clearance note 03/29/2020, due to inability to achieve 4 METS of activity a Lexiscan Myoview was ordered.  Test was done 04/01/2020 and showed large size, severe severity fixed inferior perfusion defect consistent with infarct/scar or much less likely artifact related to RBBB.  No significant reversible ischemia.  LVEF 39% with basal to mid inferior akinesis.  This is an intermediate risk study.  [name], PA-C commented on stress test result stating, "The stress test shows evidence of a probable heart attack in the past. With his known disease in his legs requiring surgery, this is not a surprise. He does not have any evidence of ischemia. The EF on this study is inaccurate. His EF on the echocardiogram in June 2021 was normal. I reviewed the study with one of our cardiologists who agreed. No further testing is needed at this time. He may proceed with his surgery. He will need follow up with one of our MDs in the [place] clinic.  PLAN:  - Proceed with surgery at elevated but acceptable risk. - I will fwd a copy to Dr. [name] as an Juluis Rainier - Please arrange follow up with one of the MDs or APP in [facility] in the next 3 mos. - Send Copy to PCP"  Per surgery posting patient is to hold Plavix 5 days prior to procedure.  Preop labs reviewed, anemia with hemoglobin 10.8.  Labs otherwise consistent with ESRD.  Nuclear stress  04/01/2020:  The left ventricular ejection fraction is moderately decreased (30-44%).  Nuclear stress EF: 39%.  No T wave inversion was noted during stress.  There was no ST segment deviation noted during stress.  Defect 1: There is a large defect of severe severity present in the mid inferior and apical inferior location.  Findings consistent with prior myocardial infarction.  This is an intermediate risk study.   Large size, severe severity fixed inferior perfusion defect consistent with infarct/scar or much less likely artifact related to RBBB. No significant reversible ischemia. LVEF 39% with basal to mid inferior akinesis. This is an intermediate risk study.   TTE 02/02/2020: 1. Left ventricular ejection fraction, by estimation, is 55 to 60%. The  left ventricle has normal function. The left ventricle has no regional  wall motion abnormalities. The left ventricular internal cavity size was  mildly dilated. There is moderate  left ventricular hypertrophy. Left ventricular diastolic parameters are  consistent with Grade I diastolic dysfunction (impaired relaxation).  2. Right ventricular systolic function is normal. The right ventricular  size is normal. There is normal pulmonary artery systolic pressure. The  estimated right ventricular systolic pressure is 84.1 mmHg.  3. Left atrial size was mildly dilated.  4. The mitral valve is grossly normal, mildly calcified annulus. Trivial  mitral valve regurgitation.  5. The aortic valve is tricuspid. Aortic valve regurgitation is not  visualized. Mild aortic valve stenosis. Aortic valve area, by VTI measures  1.49 cm. Aortic valve mean gradient measures 13.0  mmHg.  6. The inferior vena cava is normal in size with greater than 50%  respiratory variability, suggesting right atrial pressure of 3 mmHg.    Wynonia Musty [facility] Short Stay Center/Anesthesiology Phone [phone] 04/01/2020 3:54 PM

## 2020-04-01 NOTE — Progress Notes (Signed)
PCP - Gar Ponto Cardiologist - Bronson Ing Had check-up on 01-06-20 for irregular heartbeat See Dr. Court Joy note from 01-06-20 appointment  Chest x-ray - n/a EKG - 01-06-20 Stress Test - 04-01-20 ECHO - 02-02-20 Cardiac Cath - 03-18-20  DM - Type 2 Fasting Blood Sugar - 90-115   Blood Thinner Instructions: Follow surgeon's instructions on when to stop Plavix.  Last dose taken on August 14th, per patient.   COVID TEST- 04-01-20   Anesthesia review: yes, heart history, EKG  Patient denies shortness of breath, fever, cough and chest pain at PAT appointment   All instructions explained to the patient, with a verbal understanding of the material. Patient agrees to go over the instructions while at home for a better understanding. Patient also instructed to self quarantine after being tested for COVID-19. The opportunity to ask questions was provided.

## 2020-04-01 NOTE — Anesthesia Preprocedure Evaluation (Addendum)
Anesthesia Evaluation  Patient identified by MRN, date of birth, ID band Patient awake    Reviewed: Allergy & Precautions, NPO status , Patient's Chart, lab work & pertinent test results  History of Anesthesia Complications Negative for: history of anesthetic complications  Airway Mallampati: II  TM Distance: >3 FB Neck ROM: Full    Dental  (+) Edentulous Upper, Edentulous Lower   Pulmonary COPD,  COPD inhaler, Current Smoker and Patient abstained from smoking.,    Pulmonary exam normal        Cardiovascular hypertension, Pt. on medications + CAD, + Peripheral Vascular Disease and +CHF  Normal cardiovascular exam   '21 Carotid US - 60-79% left ICAS, 1-39% right ICAS  '21 Myoview - The left ventricular ejection fraction is moderately decreased (30-44%). Nuclear stress EF: 39%. No T wave inversion was noted during stress. There was no ST segment deviation noted during stress. Defect 1: There is a large defect of severe severity present in the mid inferior and apical inferior location. Findings consistent with prior myocardial infarction. This is an intermediate risk study.  '21 TTE - EF 55 to 60%. Mild dilated LV with moderate LVH. Grade I diastolic dysfunction (impaired relaxation). LA was mildly dilated. Trivial MR. Mild AS. Aortic valve area, by VTI measures 1.49 cm. Aortic valve mean gradient measures 13.0 mmHg.   "[name], PA-C commented on stress test result stating, "The stress test shows evidence of a probable heart attack in the past. With his known disease in his legs requiring surgery, this is not a surprise. He does not have any evidence of ischemia. The EF on this study is inaccurate. His EF on the echocardiogram in June 2021 was normal. I reviewed the study with one of our cardiologists who agreed. No further testing is needed at this time. He may proceed with his surgery. He will need follow up with  one of our MDs in the [place] clinic.  PLAN:  - Proceed with surgery at elevated but acceptable risk."    Neuro/Psych  Headaches, PSYCHIATRIC DISORDERS Anxiety Depression    GI/Hepatic Neg liver ROS, hiatal hernia, GERD  Medicated and Controlled,  Endo/Other  diabetes, Type 2, Insulin DependentHypothyroidism   Renal/GU ESRF and DialysisRenal disease     Musculoskeletal  (+) Arthritis ,   Abdominal   Peds  Hematology  (+) anemia ,  On plavix    Anesthesia Other Findings Covid test negative   Reproductive/Obstetrics                           Anesthesia Physical Anesthesia Plan  ASA: IV  Anesthesia Plan: General   Post-op Pain Management:    Induction: Intravenous  PONV Risk Score and Plan: 2 and Treatment may vary due to age or medical condition, Ondansetron and Dexamethasone  Airway Management Planned: Oral ETT  Additional Equipment: Arterial line  Intra-op Plan:   Post-operative Plan: Extubation in OR  Informed Consent: I have reviewed the patients History and Physical, chart, labs and discussed the procedure including the risks, benefits and alternatives for the proposed anesthesia with the patient or authorized representative who has indicated his/her understanding and acceptance.     Dental advisory given  Plan Discussed with: CRNA and Anesthesiologist  Anesthesia Plan Comments:       Anesthesia Quick Evaluation

## 2020-04-01 NOTE — Telephone Encounter (Signed)
Note faxed to surgeon's office. Please remove from [place], [place]  3:54 PM 04/01/2020

## 2020-04-01 NOTE — Telephone Encounter (Signed)
Thursday Sept 2, 2021 2pm and will have a visual field at 3:30pm at [facility] with Dr Valetta Close.  Caryl Pina a Marketing executive at [employer]. Will contact patient and make aware of appt.

## 2020-04-01 NOTE — Telephone Encounter (Signed)
Left message for patient to contact office about lab work.

## 2020-04-02 DIAGNOSIS — N2581 Secondary hyperparathyroidism of renal origin: Secondary | ICD-10-CM | POA: Diagnosis not present

## 2020-04-02 DIAGNOSIS — Z992 Dependence on renal dialysis: Secondary | ICD-10-CM | POA: Diagnosis not present

## 2020-04-02 DIAGNOSIS — D631 Anemia in chronic kidney disease: Secondary | ICD-10-CM | POA: Diagnosis not present

## 2020-04-02 DIAGNOSIS — D509 Iron deficiency anemia, unspecified: Secondary | ICD-10-CM | POA: Diagnosis not present

## 2020-04-02 DIAGNOSIS — N186 End stage renal disease: Secondary | ICD-10-CM | POA: Diagnosis not present

## 2020-04-04 ENCOUNTER — Telehealth: Payer: Self-pay | Admitting: *Deleted

## 2020-04-04 ENCOUNTER — Inpatient Hospital Stay (HOSPITAL_COMMUNITY): Payer: Medicare Other | Admitting: Physician Assistant

## 2020-04-04 ENCOUNTER — Encounter (HOSPITAL_COMMUNITY): Payer: Self-pay | Admitting: Vascular Surgery

## 2020-04-04 ENCOUNTER — Other Ambulatory Visit: Payer: Self-pay

## 2020-04-04 ENCOUNTER — Inpatient Hospital Stay (HOSPITAL_COMMUNITY)
Admission: RE | Admit: 2020-04-04 | Discharge: 2020-04-07 | DRG: 252 | Disposition: A | Discharge status: Home Health | Payer: Medicare Other | Attending: Vascular Surgery | Admitting: Vascular Surgery

## 2020-04-04 ENCOUNTER — Inpatient Hospital Stay (HOSPITAL_COMMUNITY): Payer: Medicare Other | Admitting: Anesthesiology

## 2020-04-04 ENCOUNTER — Encounter (HOSPITAL_COMMUNITY)
Admission: RE | Disposition: A | Discharge status: Home Health | Payer: Self-pay | Source: Home / Self Care | Attending: Vascular Surgery

## 2020-04-04 DIAGNOSIS — Z885 Allergy status to narcotic agent status: Secondary | ICD-10-CM | POA: Diagnosis not present

## 2020-04-04 DIAGNOSIS — E785 Hyperlipidemia, unspecified: Secondary | ICD-10-CM | POA: Diagnosis present

## 2020-04-04 DIAGNOSIS — F17210 Nicotine dependence, cigarettes, uncomplicated: Secondary | ICD-10-CM | POA: Diagnosis not present

## 2020-04-04 DIAGNOSIS — E11621 Type 2 diabetes mellitus with foot ulcer: Secondary | ICD-10-CM | POA: Diagnosis present

## 2020-04-04 DIAGNOSIS — L97229 Non-pressure chronic ulcer of left calf with unspecified severity: Secondary | ICD-10-CM | POA: Diagnosis present

## 2020-04-04 DIAGNOSIS — E1122 Type 2 diabetes mellitus with diabetic chronic kidney disease: Secondary | ICD-10-CM | POA: Diagnosis not present

## 2020-04-04 DIAGNOSIS — E1151 Type 2 diabetes mellitus with diabetic peripheral angiopathy without gangrene: Principal | ICD-10-CM | POA: Diagnosis present

## 2020-04-04 DIAGNOSIS — D509 Iron deficiency anemia, unspecified: Secondary | ICD-10-CM | POA: Diagnosis not present

## 2020-04-04 DIAGNOSIS — Z5329 Procedure and treatment not carried out because of patient's decision for other reasons: Secondary | ICD-10-CM | POA: Diagnosis not present

## 2020-04-04 DIAGNOSIS — Z79899 Other long term (current) drug therapy: Secondary | ICD-10-CM

## 2020-04-04 DIAGNOSIS — I70242 Atherosclerosis of native arteries of left leg with ulceration of calf: Secondary | ICD-10-CM | POA: Diagnosis present

## 2020-04-04 DIAGNOSIS — I739 Peripheral vascular disease, unspecified: Secondary | ICD-10-CM | POA: Diagnosis not present

## 2020-04-04 DIAGNOSIS — Z20822 Contact with and (suspected) exposure to covid-19: Secondary | ICD-10-CM | POA: Diagnosis not present

## 2020-04-04 DIAGNOSIS — L97429 Non-pressure chronic ulcer of left heel and midfoot with unspecified severity: Secondary | ICD-10-CM | POA: Diagnosis not present

## 2020-04-04 DIAGNOSIS — E039 Hypothyroidism, unspecified: Secondary | ICD-10-CM | POA: Diagnosis present

## 2020-04-04 DIAGNOSIS — I120 Hypertensive chronic kidney disease with stage 5 chronic kidney disease or end stage renal disease: Secondary | ICD-10-CM | POA: Diagnosis not present

## 2020-04-04 DIAGNOSIS — M62838 Other muscle spasm: Secondary | ICD-10-CM | POA: Diagnosis not present

## 2020-04-04 DIAGNOSIS — J449 Chronic obstructive pulmonary disease, unspecified: Secondary | ICD-10-CM | POA: Diagnosis present

## 2020-04-04 DIAGNOSIS — Z7902 Long term (current) use of antithrombotics/antiplatelets: Secondary | ICD-10-CM

## 2020-04-04 DIAGNOSIS — K219 Gastro-esophageal reflux disease without esophagitis: Secondary | ICD-10-CM | POA: Diagnosis present

## 2020-04-04 DIAGNOSIS — E11622 Type 2 diabetes mellitus with other skin ulcer: Secondary | ICD-10-CM | POA: Diagnosis present

## 2020-04-04 DIAGNOSIS — I70244 Atherosclerosis of native arteries of left leg with ulceration of heel and midfoot: Secondary | ICD-10-CM | POA: Diagnosis present

## 2020-04-04 DIAGNOSIS — I2510 Atherosclerotic heart disease of native coronary artery without angina pectoris: Secondary | ICD-10-CM | POA: Diagnosis present

## 2020-04-04 DIAGNOSIS — Z794 Long term (current) use of insulin: Secondary | ICD-10-CM | POA: Diagnosis not present

## 2020-04-04 DIAGNOSIS — N186 End stage renal disease: Secondary | ICD-10-CM | POA: Diagnosis present

## 2020-04-04 DIAGNOSIS — E1140 Type 2 diabetes mellitus with diabetic neuropathy, unspecified: Secondary | ICD-10-CM | POA: Diagnosis present

## 2020-04-04 DIAGNOSIS — D631 Anemia in chronic kidney disease: Secondary | ICD-10-CM | POA: Diagnosis not present

## 2020-04-04 DIAGNOSIS — Z95820 Peripheral vascular angioplasty status with implants and grafts: Secondary | ICD-10-CM | POA: Diagnosis not present

## 2020-04-04 DIAGNOSIS — Z992 Dependence on renal dialysis: Secondary | ICD-10-CM

## 2020-04-04 DIAGNOSIS — I252 Old myocardial infarction: Secondary | ICD-10-CM

## 2020-04-04 DIAGNOSIS — Z79890 Hormone replacement therapy (postmenopausal): Secondary | ICD-10-CM

## 2020-04-04 DIAGNOSIS — E1129 Type 2 diabetes mellitus with other diabetic kidney complication: Secondary | ICD-10-CM | POA: Diagnosis not present

## 2020-04-04 DIAGNOSIS — Z888 Allergy status to other drugs, medicaments and biological substances status: Secondary | ICD-10-CM

## 2020-04-04 DIAGNOSIS — Z89611 Acquired absence of right leg above knee: Secondary | ICD-10-CM

## 2020-04-04 DIAGNOSIS — I998 Other disorder of circulatory system: Secondary | ICD-10-CM | POA: Diagnosis not present

## 2020-04-04 DIAGNOSIS — N2581 Secondary hyperparathyroidism of renal origin: Secondary | ICD-10-CM | POA: Diagnosis not present

## 2020-04-04 DIAGNOSIS — I70202 Unspecified atherosclerosis of native arteries of extremities, left leg: Secondary | ICD-10-CM | POA: Diagnosis not present

## 2020-04-04 HISTORY — PX: FEMORAL-POPLITEAL BYPASS GRAFT: SHX937

## 2020-04-04 HISTORY — PX: ENDARTERECTOMY FEMORAL: SHX5804

## 2020-04-04 LAB — CBC
HCT: 28.1 % — ABNORMAL LOW (ref 39.0–52.0)
Hemoglobin: 9.6 g/dL — ABNORMAL LOW (ref 13.0–17.0)
MCH: 35.6 pg — ABNORMAL HIGH (ref 26.0–34.0)
MCHC: 34.2 g/dL (ref 30.0–36.0)
MCV: 104.1 fL — ABNORMAL HIGH (ref 80.0–100.0)
Platelets: 194 10*3/uL (ref 150–400)
RBC: 2.7 MIL/uL — ABNORMAL LOW (ref 4.22–5.81)
RDW: 14.9 % (ref 11.5–15.5)
WBC: 9.7 10*3/uL (ref 4.0–10.5)
nRBC: 0 % (ref 0.0–0.2)

## 2020-04-04 LAB — GLUCOSE, CAPILLARY
Glucose-Capillary: 127 mg/dL — ABNORMAL HIGH (ref 70–99)
Glucose-Capillary: 237 mg/dL — ABNORMAL HIGH (ref 70–99)
Glucose-Capillary: 237 mg/dL — ABNORMAL HIGH (ref 70–99)
Glucose-Capillary: 186 mg/dL — ABNORMAL HIGH (ref 70–99)
Glucose-Capillary: 144 mg/dL — ABNORMAL HIGH (ref 70–99)
Glucose-Capillary: 165 mg/dL — ABNORMAL HIGH (ref 70–99)

## 2020-04-04 LAB — CREATININE, SERUM
Creatinine, Ser: 9.6 mg/dL — ABNORMAL HIGH (ref 0.61–1.24)
GFR calc Af Amer: 6 mL/min — ABNORMAL LOW (ref >60)
GFR calc non Af Amer: 5 mL/min — ABNORMAL LOW (ref >60)

## 2020-04-04 SURGERY — ENDARTERECTOMY, FEMORAL
Anesthesia: General | Site: Leg Upper | Laterality: Left

## 2020-04-04 MED ORDER — ROCURONIUM BROMIDE 10 MG/ML (PF) SYRINGE
PREFILLED_SYRINGE | INTRAVENOUS | Status: AC
  Filled 2020-04-04: qty 10

## 2020-04-04 MED ORDER — CEFAZOLIN SODIUM-DEXTROSE 2-4 GM/100ML-% IV SOLN
2.0000 g | Freq: Three times a day (TID) | INTRAVENOUS | Status: AC
  Administered 2020-04-04 – 2020-04-05 (×2): 2 g via INTRAVENOUS
  Filled 2020-04-04 (×2): qty 100

## 2020-04-04 MED ORDER — ONDANSETRON HCL 4 MG/2ML IJ SOLN
INTRAMUSCULAR | Status: DC | PRN
  Administered 2020-04-04: 4 mg via INTRAVENOUS

## 2020-04-04 MED ORDER — INSULIN ASPART 100 UNIT/ML ~~LOC~~ SOLN
SUBCUTANEOUS | Status: DC | PRN
  Administered 2020-04-04: 10 [IU] via SUBCUTANEOUS

## 2020-04-04 MED ORDER — CEFAZOLIN SODIUM-DEXTROSE 2-4 GM/100ML-% IV SOLN
INTRAVENOUS | Status: AC
  Filled 2020-04-04: qty 100

## 2020-04-04 MED ORDER — ROCURONIUM BROMIDE 10 MG/ML (PF) SYRINGE
PREFILLED_SYRINGE | INTRAVENOUS | Status: DC | PRN
  Administered 2020-04-04: 30 mg via INTRAVENOUS
  Administered 2020-04-04: 60 mg via INTRAVENOUS
  Administered 2020-04-04 (×2): 20 mg via INTRAVENOUS

## 2020-04-04 MED ORDER — INSULIN ASPART 100 UNIT/ML ~~LOC~~ SOLN
SUBCUTANEOUS | Status: AC
  Filled 2020-04-04: qty 1

## 2020-04-04 MED ORDER — CALCIUM ACETATE (PHOS BINDER) 667 MG PO CAPS
1334.0000 mg | ORAL_CAPSULE | ORAL | Status: DC
  Administered 2020-04-06: 1334 mg via ORAL
  Filled 2020-04-04 (×2): qty 2

## 2020-04-04 MED ORDER — FENTANYL CITRATE (PF) 250 MCG/5ML IJ SOLN
INTRAMUSCULAR | Status: AC
  Filled 2020-04-04: qty 5

## 2020-04-04 MED ORDER — ACETAMINOPHEN 325 MG PO TABS: 325.0000 mg | ORAL_TABLET | ORAL | Status: DC | PRN

## 2020-04-04 MED ORDER — HEPARIN SODIUM (PORCINE) 1000 UNIT/ML IJ SOLN
INTRAMUSCULAR | Status: AC
  Filled 2020-04-04: qty 2

## 2020-04-04 MED ORDER — SODIUM CHLORIDE 0.9 % IV SOLN: INTRAVENOUS | Status: DC

## 2020-04-04 MED ORDER — OXYCODONE HCL 5 MG PO TABS
5.0000 mg | ORAL_TABLET | Freq: Once | ORAL | Status: AC | PRN
  Administered 2020-04-04: 5 mg via ORAL

## 2020-04-04 MED ORDER — PANTOPRAZOLE SODIUM 40 MG PO TBEC
40.0000 mg | DELAYED_RELEASE_TABLET | Freq: Every day | ORAL | Status: DC
  Administered 2020-04-04 – 2020-04-06 (×2): 40 mg via ORAL
  Filled 2020-04-04 (×4): qty 1

## 2020-04-04 MED ORDER — CHLORHEXIDINE GLUCONATE 0.12 % MT SOLN: 15.0000 mL | Freq: Once | OROMUCOSAL | Status: AC

## 2020-04-04 MED ORDER — DEXAMETHASONE SODIUM PHOSPHATE 10 MG/ML IJ SOLN
INTRAMUSCULAR | Status: DC | PRN
  Administered 2020-04-04: 4 mg via INTRAVENOUS

## 2020-04-04 MED ORDER — HEPARIN SODIUM (PORCINE) 5000 UNIT/ML IJ SOLN
5000.0000 [IU] | Freq: Three times a day (TID) | INTRAMUSCULAR | Status: DC
  Administered 2020-04-05 – 2020-04-07 (×7): 5000 [IU] via SUBCUTANEOUS
  Filled 2020-04-04 (×7): qty 1

## 2020-04-04 MED ORDER — ACETAMINOPHEN 650 MG RE SUPP: 325.0000 mg | RECTAL | Status: DC | PRN

## 2020-04-04 MED ORDER — PHENYLEPHRINE 40 MCG/ML (10ML) SYRINGE FOR IV PUSH (FOR BLOOD PRESSURE SUPPORT)
PREFILLED_SYRINGE | INTRAVENOUS | Status: DC | PRN
  Administered 2020-04-04: 80 ug via INTRAVENOUS
  Administered 2020-04-04: 120 ug via INTRAVENOUS

## 2020-04-04 MED ORDER — FENTANYL CITRATE (PF) 100 MCG/2ML IJ SOLN
25.0000 ug | INTRAMUSCULAR | Status: DC | PRN
  Administered 2020-04-04: 50 ug via INTRAVENOUS
  Administered 2020-04-04 (×2): 25 ug via INTRAVENOUS

## 2020-04-04 MED ORDER — CLOPIDOGREL BISULFATE 75 MG PO TABS
75.0000 mg | ORAL_TABLET | Freq: Every day | ORAL | Status: DC
  Administered 2020-04-05 – 2020-04-07 (×3): 75 mg via ORAL
  Filled 2020-04-04 (×3): qty 1

## 2020-04-04 MED ORDER — ONDANSETRON HCL 4 MG/2ML IJ SOLN: 4.0000 mg | Freq: Once | INTRAMUSCULAR | Status: DC | PRN

## 2020-04-04 MED ORDER — HYDRALAZINE HCL 20 MG/ML IJ SOLN: 5.0000 mg | INTRAMUSCULAR | Status: DC | PRN

## 2020-04-04 MED ORDER — DIPHENHYDRAMINE HCL 25 MG PO CAPS
25.0000 mg | ORAL_CAPSULE | Freq: Every day | ORAL | Status: DC | PRN
  Administered 2020-04-05: 25 mg via ORAL
  Filled 2020-04-04: qty 1

## 2020-04-04 MED ORDER — LACTATED RINGERS IV SOLN: INTRAVENOUS | Status: DC | PRN

## 2020-04-04 MED ORDER — LISINOPRIL 10 MG PO TABS
10.0000 mg | ORAL_TABLET | ORAL | Status: DC
  Administered 2020-04-07: 10 mg via ORAL
  Filled 2020-04-04: qty 1

## 2020-04-04 MED ORDER — CALCIUM ACETATE (PHOS BINDER) 667 MG PO CAPS
2001.0000 mg | ORAL_CAPSULE | Freq: Three times a day (TID) | ORAL | Status: DC
  Administered 2020-04-04 – 2020-04-06 (×2): 2001 mg via ORAL
  Filled 2020-04-04 (×5): qty 3

## 2020-04-04 MED ORDER — 0.9 % SODIUM CHLORIDE (POUR BTL) OPTIME
TOPICAL | Status: DC | PRN
  Administered 2020-04-04 (×3): 1000 mL

## 2020-04-04 MED ORDER — PHENOL 1.4 % MT LIQD: 1.0000 | OROMUCOSAL | Status: DC | PRN

## 2020-04-04 MED ORDER — SUGAMMADEX SODIUM 200 MG/2ML IV SOLN
INTRAVENOUS | Status: DC | PRN
  Administered 2020-04-04: 200 mg via INTRAVENOUS

## 2020-04-04 MED ORDER — ORAL CARE MOUTH RINSE: 15.0000 mL | Freq: Once | OROMUCOSAL | Status: AC

## 2020-04-04 MED ORDER — DEXAMETHASONE SODIUM PHOSPHATE 10 MG/ML IJ SOLN
INTRAMUSCULAR | Status: AC
  Filled 2020-04-04: qty 1

## 2020-04-04 MED ORDER — PROPOFOL 10 MG/ML IV BOLUS
INTRAVENOUS | Status: AC
  Filled 2020-04-04: qty 20

## 2020-04-04 MED ORDER — ALBUMIN HUMAN 5 % IV SOLN: INTRAVENOUS | Status: DC | PRN

## 2020-04-04 MED ORDER — INSULIN ASPART 100 UNIT/ML ~~LOC~~ SOLN
0.0000 [IU] | Freq: Three times a day (TID) | SUBCUTANEOUS | Status: DC
  Administered 2020-04-05 – 2020-04-06 (×2): 2 [IU] via SUBCUTANEOUS
  Administered 2020-04-06 – 2020-04-07 (×4): 3 [IU] via SUBCUTANEOUS

## 2020-04-04 MED ORDER — CHLORHEXIDINE GLUCONATE CLOTH 2 % EX PADS
6.0000 | MEDICATED_PAD | Freq: Once | CUTANEOUS | Status: AC
  Administered 2020-04-04: 6 via TOPICAL

## 2020-04-04 MED ORDER — MELATONIN 5 MG PO TABS
10.0000 mg | ORAL_TABLET | Freq: Every day | ORAL | Status: DC
  Administered 2020-04-04 – 2020-04-06 (×3): 10 mg via ORAL
  Filled 2020-04-04 (×3): qty 2

## 2020-04-04 MED ORDER — PREGABALIN 75 MG PO CAPS
150.0000 mg | ORAL_CAPSULE | Freq: Three times a day (TID) | ORAL | Status: DC
  Administered 2020-04-04 – 2020-04-07 (×8): 150 mg via ORAL
  Filled 2020-04-04 (×8): qty 2

## 2020-04-04 MED ORDER — DULOXETINE HCL 30 MG PO CPEP
30.0000 mg | ORAL_CAPSULE | Freq: Every day | ORAL | Status: DC
  Administered 2020-04-05 – 2020-04-07 (×3): 30 mg via ORAL
  Filled 2020-04-04 (×3): qty 1

## 2020-04-04 MED ORDER — LEVOTHYROXINE SODIUM 25 MCG PO TABS
125.0000 ug | ORAL_TABLET | Freq: Every day | ORAL | Status: DC
  Administered 2020-04-05 – 2020-04-07 (×3): 125 ug via ORAL
  Filled 2020-04-04 (×3): qty 1

## 2020-04-04 MED ORDER — FENTANYL CITRATE (PF) 100 MCG/2ML IJ SOLN
INTRAMUSCULAR | Status: AC
Start: 2020-04-04 — End: 2020-04-05
  Filled 2020-04-04: qty 2

## 2020-04-04 MED ORDER — INSULIN ASPART 100 UNIT/ML ~~LOC~~ SOLN
5.0000 [IU] | Freq: Once | SUBCUTANEOUS | Status: AC
  Administered 2020-04-04: 5 [IU] via SUBCUTANEOUS

## 2020-04-04 MED ORDER — ALBUTEROL SULFATE (2.5 MG/3ML) 0.083% IN NEBU: 2.5000 mg | INHALATION_SOLUTION | Freq: Four times a day (QID) | RESPIRATORY_TRACT | Status: DC | PRN

## 2020-04-04 MED ORDER — GUAIFENESIN-DM 100-10 MG/5ML PO SYRP: 15.0000 mL | ORAL_SOLUTION | ORAL | Status: DC | PRN

## 2020-04-04 MED ORDER — CHLORHEXIDINE GLUCONATE 0.12 % MT SOLN
OROMUCOSAL | Status: AC
  Administered 2020-04-04: 15 mL via OROMUCOSAL
  Filled 2020-04-04: qty 15

## 2020-04-04 MED ORDER — HYDROMORPHONE HCL 1 MG/ML IJ SOLN
0.5000 mg | INTRAMUSCULAR | Status: DC | PRN
  Administered 2020-04-05: 0.5 mg via INTRAVENOUS
  Administered 2020-04-05 – 2020-04-06 (×5): 1 mg via INTRAVENOUS
  Filled 2020-04-04 (×5): qty 1

## 2020-04-04 MED ORDER — SODIUM CHLORIDE 0.9 % IV SOLN: 500.0000 mL | Freq: Once | INTRAVENOUS | Status: DC | PRN

## 2020-04-04 MED ORDER — ESMOLOL HCL 100 MG/10ML IV SOLN
INTRAVENOUS | Status: DC | PRN
  Administered 2020-04-04: 30 mg via INTRAVENOUS
  Administered 2020-04-04: 20 mg via INTRAVENOUS

## 2020-04-04 MED ORDER — LIDOCAINE 2% (20 MG/ML) 5 ML SYRINGE
INTRAMUSCULAR | Status: AC
  Filled 2020-04-04: qty 5

## 2020-04-04 MED ORDER — INSULIN ASPART 100 UNIT/ML ~~LOC~~ SOLN
10.0000 [IU] | Freq: Once | SUBCUTANEOUS | Status: AC
  Administered 2020-04-04: 10 [IU] via SUBCUTANEOUS

## 2020-04-04 MED ORDER — ONDANSETRON HCL 4 MG/2ML IJ SOLN
INTRAMUSCULAR | Status: AC
  Filled 2020-04-04: qty 2

## 2020-04-04 MED ORDER — MIDODRINE HCL 5 MG PO TABS
10.0000 mg | ORAL_TABLET | ORAL | Status: DC
  Administered 2020-04-05: 10 mg via ORAL
  Filled 2020-04-04 (×2): qty 2

## 2020-04-04 MED ORDER — LIDOCAINE 2% (20 MG/ML) 5 ML SYRINGE
INTRAMUSCULAR | Status: DC | PRN
  Administered 2020-04-04: 80 mg via INTRAVENOUS

## 2020-04-04 MED ORDER — SODIUM CHLORIDE 0.9 % IV SOLN
INTRAVENOUS | Status: DC | PRN
  Administered 2020-04-04: 500 mL

## 2020-04-04 MED ORDER — MIDAZOLAM HCL 2 MG/2ML IJ SOLN
INTRAMUSCULAR | Status: AC
  Filled 2020-04-04: qty 2

## 2020-04-04 MED ORDER — CEFAZOLIN SODIUM-DEXTROSE 2-4 GM/100ML-% IV SOLN
2.0000 g | INTRAVENOUS | Status: AC
  Administered 2020-04-04: 2 g via INTRAVENOUS

## 2020-04-04 MED ORDER — OXYCODONE HCL 5 MG/5ML PO SOLN: 5.0000 mg | Freq: Once | ORAL | Status: AC | PRN

## 2020-04-04 MED ORDER — PROTAMINE SULFATE 10 MG/ML IV SOLN
INTRAVENOUS | Status: DC | PRN
  Administered 2020-04-04: 100 mg via INTRAVENOUS

## 2020-04-04 MED ORDER — LABETALOL HCL 5 MG/ML IV SOLN: 10.0000 mg | INTRAVENOUS | Status: DC | PRN

## 2020-04-04 MED ORDER — CHLORHEXIDINE GLUCONATE CLOTH 2 % EX PADS: 6.0000 | MEDICATED_PAD | Freq: Once | CUTANEOUS | Status: DC

## 2020-04-04 MED ORDER — METOPROLOL TARTRATE 5 MG/5ML IV SOLN: 2.0000 mg | INTRAVENOUS | Status: DC | PRN

## 2020-04-04 MED ORDER — CINACALCET HCL 30 MG PO TABS
30.0000 mg | ORAL_TABLET | Freq: Every evening | ORAL | Status: DC
  Administered 2020-04-04 – 2020-04-06 (×3): 30 mg via ORAL
  Filled 2020-04-04 (×3): qty 1

## 2020-04-04 MED ORDER — DEXTROSE 50 % IV SOLN
INTRAVENOUS | Status: DC | PRN
  Administered 2020-04-04: 25 mL via INTRAVENOUS

## 2020-04-04 MED ORDER — ATORVASTATIN CALCIUM 80 MG PO TABS
80.0000 mg | ORAL_TABLET | Freq: Every day | ORAL | Status: DC
  Administered 2020-04-05 – 2020-04-07 (×3): 80 mg via ORAL
  Filled 2020-04-04 (×3): qty 1

## 2020-04-04 MED ORDER — LACTATED RINGERS IV SOLN: INTRAVENOUS | Status: DC

## 2020-04-04 MED ORDER — ONDANSETRON HCL 4 MG/2ML IJ SOLN: 4.0000 mg | Freq: Four times a day (QID) | INTRAMUSCULAR | Status: DC | PRN

## 2020-04-04 MED ORDER — SODIUM CHLORIDE 0.9 % IV SOLN
INTRAVENOUS | Status: AC
  Filled 2020-04-04: qty 1.2

## 2020-04-04 MED ORDER — POLYETHYLENE GLYCOL 3350 17 G PO PACK: 17.0000 g | PACK | Freq: Every day | ORAL | Status: DC | PRN

## 2020-04-04 MED ORDER — HEPARIN SODIUM (PORCINE) 1000 UNIT/ML IJ SOLN
INTRAMUSCULAR | Status: DC | PRN
  Administered 2020-04-04: 10000 [IU] via INTRAVENOUS
  Administered 2020-04-04: 3000 [IU] via INTRAVENOUS

## 2020-04-04 MED ORDER — CALCIUM CHLORIDE 10 % IV SOLN
INTRAVENOUS | Status: DC | PRN
  Administered 2020-04-04: 500 mg via INTRAVENOUS

## 2020-04-04 MED ORDER — FENTANYL CITRATE (PF) 250 MCG/5ML IJ SOLN
INTRAMUSCULAR | Status: DC | PRN
Start: 2020-04-04 — End: 2020-04-04
  Administered 2020-04-04 (×2): 25 ug via INTRAVENOUS
  Administered 2020-04-04: 50 ug via INTRAVENOUS
  Administered 2020-04-04: 25 ug via INTRAVENOUS
  Administered 2020-04-04: 50 ug via INTRAVENOUS
  Administered 2020-04-04: 25 ug via INTRAVENOUS
  Administered 2020-04-04: 50 ug via INTRAVENOUS

## 2020-04-04 MED ORDER — HEMOSTATIC AGENTS (NO CHARGE) OPTIME
TOPICAL | Status: DC | PRN
  Administered 2020-04-04: 1 via TOPICAL

## 2020-04-04 MED ORDER — PROPOFOL 10 MG/ML IV BOLUS
INTRAVENOUS | Status: DC | PRN
  Administered 2020-04-04: 50 mg via INTRAVENOUS
  Administered 2020-04-04: 100 mg via INTRAVENOUS

## 2020-04-04 MED ORDER — OXYCODONE-ACETAMINOPHEN 5-325 MG PO TABS
1.0000 | ORAL_TABLET | ORAL | Status: DC | PRN
  Administered 2020-04-04 – 2020-04-07 (×10): 2 via ORAL
  Filled 2020-04-04 (×10): qty 2

## 2020-04-04 MED ORDER — PHENYLEPHRINE HCL-NACL 10-0.9 MG/250ML-% IV SOLN
INTRAVENOUS | Status: DC | PRN
  Administered 2020-04-04: 30 ug/min via INTRAVENOUS

## 2020-04-04 MED ORDER — OXYCODONE HCL 5 MG PO TABS
ORAL_TABLET | ORAL | Status: AC
Start: 2020-04-04 — End: 2020-04-05
  Filled 2020-04-04: qty 1

## 2020-04-04 SURGICAL SUPPLY — 74 items
BANDAGE ESMARK 6X9 LF (GAUZE/BANDAGES/DRESSINGS) IMPLANT
BNDG ESMARK 6X9 LF (GAUZE/BANDAGES/DRESSINGS)
CANISTER SUCT 3000ML PPV (MISCELLANEOUS) ×3 IMPLANT
CANNULA VESSEL 3MM 2 BLNT TIP (CANNULA) ×9 IMPLANT
CLIP VESOCCLUDE MED 24/CT (CLIP) ×3 IMPLANT
CLIP VESOCCLUDE SM WIDE 24/CT (CLIP) ×6 IMPLANT
CLIP VESOCCLUDE SM WIDE 6/CT (CLIP) ×3 IMPLANT
CNTNR URN SCR LID CUP LEK RST (MISCELLANEOUS) ×2 IMPLANT
CONT SPEC 4OZ STRL OR WHT (MISCELLANEOUS) ×3
COVER PROBE W GEL 5X96 (DRAPES) ×3 IMPLANT
COVER WAND RF STERILE (DRAPES) ×3 IMPLANT
CUFF TOURN SGL QUICK 24 (TOURNIQUET CUFF)
CUFF TOURN SGL QUICK 34 (TOURNIQUET CUFF)
CUFF TOURN SGL QUICK 42 (TOURNIQUET CUFF) IMPLANT
CUFF TRNQT CYL 24X4X16.5-23 (TOURNIQUET CUFF) IMPLANT
CUFF TRNQT CYL 34X4.125X (TOURNIQUET CUFF) IMPLANT
DERMABOND ADVANCED (GAUZE/BANDAGES/DRESSINGS) ×2
DERMABOND ADVANCED .7 DNX12 (GAUZE/BANDAGES/DRESSINGS) ×4 IMPLANT
DRAIN HEMOVAC 1/8 X 5 (WOUND CARE) IMPLANT
DRAPE HALF SHEET 40X57 (DRAPES) IMPLANT
DRAPE INCISE IOBAN 66X45 STRL (DRAPES) ×3 IMPLANT
DRAPE X-RAY CASS 24X20 (DRAPES) IMPLANT
ELECT BLADE 4.0 EZ CLEAN MEGAD (MISCELLANEOUS) ×3
ELECT REM PT RETURN 9FT ADLT (ELECTROSURGICAL) ×3
ELECTRODE BLDE 4.0 EZ CLN MEGD (MISCELLANEOUS) ×2 IMPLANT
ELECTRODE REM PT RTRN 9FT ADLT (ELECTROSURGICAL) ×2 IMPLANT
EVACUATOR SILICONE 100CC (DRAIN) IMPLANT
GAUZE 4X4 16PLY RFD (DISPOSABLE) ×6 IMPLANT
GLOVE BIO SURGEON STRL SZ 6.5 (GLOVE) ×3 IMPLANT
GLOVE BIO SURGEON STRL SZ7.5 (GLOVE) ×15 IMPLANT
GLOVE BIOGEL PI IND STRL 6.5 (GLOVE) ×2 IMPLANT
GLOVE BIOGEL PI IND STRL 8 (GLOVE) ×2 IMPLANT
GLOVE BIOGEL PI INDICATOR 6.5 (GLOVE) ×1
GLOVE BIOGEL PI INDICATOR 8 (GLOVE) ×1
GLOVE ECLIPSE 6.5 STRL STRAW (GLOVE) ×3 IMPLANT
GLOVE INDICATOR 7.0 STRL GRN (GLOVE) ×12 IMPLANT
GOWN STRL REUS W/ TWL LRG LVL3 (GOWN DISPOSABLE) ×10 IMPLANT
GOWN STRL REUS W/ TWL XL LVL3 (GOWN DISPOSABLE) ×4 IMPLANT
GOWN STRL REUS W/TWL LRG LVL3 (GOWN DISPOSABLE) ×15
GOWN STRL REUS W/TWL XL LVL3 (GOWN DISPOSABLE) ×6
HEMOSTAT SPONGE AVITENE ULTRA (HEMOSTASIS) IMPLANT
KIT BASIN OR (CUSTOM PROCEDURE TRAY) ×3 IMPLANT
KIT TURNOVER KIT B (KITS) ×3 IMPLANT
LOOP VESSEL MAXI BLUE (MISCELLANEOUS) ×6 IMPLANT
LOOP VESSEL MINI RED (MISCELLANEOUS) ×3 IMPLANT
MARKER SKIN DUAL TIP RULER LAB (MISCELLANEOUS) ×3 IMPLANT
NS IRRIG 1000ML POUR BTL (IV SOLUTION) ×6 IMPLANT
PACK PERIPHERAL VASCULAR (CUSTOM PROCEDURE TRAY) ×3 IMPLANT
PAD ARMBOARD 7.5X6 YLW CONV (MISCELLANEOUS) ×6 IMPLANT
PENCIL BUTTON HOLSTER BLD 10FT (ELECTRODE) ×3 IMPLANT
SET COLLECT BLD 21X3/4 12 (NEEDLE) IMPLANT
SPONGE INTESTINAL PEANUT (DISPOSABLE) ×3 IMPLANT
SPONGE LAP 18X18 RF (DISPOSABLE) ×9 IMPLANT
STOPCOCK 4 WAY LG BORE MALE ST (IV SETS) IMPLANT
SUT ETHILON 3 0 PS 1 (SUTURE) IMPLANT
SUT PROLENE 5 0 C 1 24 (SUTURE) ×9 IMPLANT
SUT PROLENE 6 0 CC (SUTURE) ×27 IMPLANT
SUT PROLENE 7 0 BV 1 (SUTURE) IMPLANT
SUT PROLENE 7 0 BV1 MDA (SUTURE) ×3 IMPLANT
SUT SILK 2 0 SH (SUTURE) ×3 IMPLANT
SUT SILK 3 0 (SUTURE) ×9
SUT SILK 3-0 18XBRD TIE 12 (SUTURE) ×6 IMPLANT
SUT VIC AB 2-0 SH 27 (SUTURE) ×12
SUT VIC AB 2-0 SH 27XBRD (SUTURE) ×8 IMPLANT
SUT VIC AB 3-0 SH 27 (SUTURE) ×27
SUT VIC AB 3-0 SH 27X BRD (SUTURE) ×18 IMPLANT
SUT VIC AB 4-0 PS2 18 (SUTURE) ×15 IMPLANT
SUT VIC AB 4-0 PS2 27 (SUTURE) ×6 IMPLANT
TAPE UMBILICAL COTTON 1/8X30 (MISCELLANEOUS) ×3 IMPLANT
TOWEL GREEN STERILE (TOWEL DISPOSABLE) ×3 IMPLANT
TRAY FOLEY MTR SLVR 16FR STAT (SET/KITS/TRAYS/PACK) IMPLANT
TUBING EXTENTION W/L.L. (IV SETS) IMPLANT
UNDERPAD 30X36 HEAVY ABSORB (UNDERPADS AND DIAPERS) ×3 IMPLANT
WATER STERILE IRR 1000ML POUR (IV SOLUTION) ×3 IMPLANT

## 2020-04-04 NOTE — Transfer of Care (Signed)
Immediate Anesthesia Transfer of Care Note  Patient: [name]  Procedure(s) Performed: ENDARTERECTOMY COMMON FEMORAL (Left Groin) FEMORAL- BELOW KNEE POPLITEAL BYPASS GRAFT NON REVERSED VEIN (Left Leg Upper)  Patient Location: PACU  Anesthesia Type:General  Level of Consciousness: awake  Airway & Oxygen Therapy: Patient Spontanous Breathing  Post-op Assessment: Report given to RN and Post -op Vital signs reviewed and stable  Post vital signs: Reviewed and stable  Last Vitals:  Vitals Value Taken Time  BP 122/60 04/04/20 1319  Temp    Pulse 75 04/04/20 1322  Resp 16 04/04/20 1322  SpO2 93 % 04/04/20 1322  Vitals shown include unvalidated device data.  Last Pain:  Vitals:   04/04/20 0603  TempSrc:   PainSc: 4       Patients Stated Pain Goal: 2 (53/79/43 2761)  Complications: No complications documented.

## 2020-04-04 NOTE — Consult Note (Signed)
Renal Service Consult Note [name]  [name] 04/04/2020 [name] Requesting Physician:  Dr [name], C.   Reason for Consult:  ESRD pt sp L fem-pop bypass graft HPI: The patient is a 69 y.o. year-old w/  Hx of DM, ESRD on HD, PAD R BKA, DM2, HTN, depression, COPD admitted for elective LLE surgical revascularization which was completed this am.  We are asked to see for HD.    Pt seen in PACU, alert and responding appropriately.  No c/o CP, SOB, n/v/abd pain, no recent HD issues or AVF issues.    ROS  denies CP  no joint pain   no HA  no blurry vision  no rash  no diarrhea  no nausea/ vomiting   Past Medical History  Past Medical History:  Diagnosis Date  . Anemia   . Anxiety   . Arthritis   . CAD (coronary artery disease)    Myoview 8/21: EF 39, Lg inf defect c/w prior infarct (less likely artifact from RBBB), no ischemia; Intermediate Risk   . Chronic kidney disease    Dialysis T/Th/Sa  . COPD (chronic obstructive pulmonary disease) ([name])    Pt denies  . Depression   . Diabetes mellitus without complication ([name])   . GERD (gastroesophageal reflux disease)   . H/O hiatal hernia   . Headache(784.0)    Hx: Migraines  . History of kidney stones   . Hypertension   . Hypothyroidism   . Neuropathy   . Non-healing wound of amputation stump ([name])    right  . Numbness of toes    toes and feet  . Pneumonia    Hx: of several times - pt denies (04/24/19)  . Renal insufficiency   . Shortness of breath dyspnea   . Type 2 diabetes mellitus [name])    Past Surgical History  Past Surgical History:  Procedure Laterality Date  . A/V FISTULAGRAM Left 01/16/2019   Procedure: A/V FISTULAGRAM;  Surgeon: [name], MD;  Location: [place] [facility];  Service: Cardiovascular;  Laterality: Left;  . ABDOMINAL AORTOGRAM W/LOWER EXTREMITY Bilateral 03/18/2020   Procedure: ABDOMINAL AORTOGRAM W/LOWER EXTREMITY;  Surgeon: [name], MD;   Location: [place] [facility];  Service: Cardiovascular;  Laterality: Bilateral;  . AMPUTATION Right 12/01/2018   Procedure: RIGHT AMPUTATION BELOW KNEE;  Surgeon: [name], MD;  Location: [facility] OR;  Service: Vascular;  Laterality: Right;  . AMPUTATION Right 04/27/2019   Procedure: AMPUTATION BELOW KNEE REVISION;  Surgeon: [name], MD;  Location: [place];  Service: Vascular;  Laterality: Right;  . AMPUTATION Right 04/30/2019   Procedure: AMPUTATION ABOVE KNEE - RIGHT;  Surgeon: Waynetta Sandy, MD;  Location: [place];  Service: Vascular;  Laterality: Right;  . APPLICATION OF WOUND VAC Right 04/27/2019   Procedure: APPLICATION OF WOUND VAC;  Surgeon: [name], MD;  Location: [place];  Service: Vascular;  Laterality: Right;  . AV FISTULA PLACEMENT  2012      left arm   . AV FISTULA PLACEMENT Left 12/18/2012   Procedure: ARTERIOVENOUS (AV) FISTULA CREATION;  Surgeon: Angelia Mould, MD;  Location: [place];  Service: Vascular;  Laterality: Left;  . COLONOSCOPY  10/26/2011   Procedure: COLONOSCOPY;  Surgeon: Rogene Houston, MD;  [facility];  Service: Endoscopy;  Laterality: N/A;  730  . EMBOLECTOMY Left 12/09/2012   Procedure: EMBOLECTOMY;  Surgeon: Serafina Mitchell, MD;  Location: [facility] CATH LAB;  Service: Cardiovascular;  Laterality: Left;  left arm venous embolization  . ESOPHAGOGASTRODUODENOSCOPY (EGD) WITH ESOPHAGEAL DILATION N/A 04/23/2013   Procedure: ESOPHAGOGASTRODUODENOSCOPY (EGD) WITH ESOPHAGEAL DILATION;  Surgeon: Rogene Houston, MD;  [facility];  Service: Endoscopy;  Laterality: N/A;  200-moved to 930   . FISTULA SUPERFICIALIZATION Left 06/18/2013   Procedure: FISTULA SUPERFICIALIZATION & LIGATION BRANCH X 1;  Surgeon: Mal Misty, MD;  Location: [place];  Service: Vascular;  Laterality: Left;  . HEMATOMA EVACUATION Right 02/11/2017   Procedure: EVACUATION HEMATOMA RIGHT GROIN, Repair of Right Pseudo-anerysm.;  Surgeon: [name],  MD;  Location: [facility];  Service: Vascular;  Laterality: Right;  . INGUINAL HERNIA REPAIR     ,  times   2  . INSERTION OF DIALYSIS CATHETER Left 12/18/2012   Procedure: INSERTION OF DIALYSIS CATHETER;  Surgeon: Angelia Mould, MD;  Location: [place];  Service: Vascular;  Laterality: Left;  . KNEE ARTHROSCOPY  2011   Right Knee  . LOWER EXTREMITY ANGIOGRAPHY N/A 02/11/2017   Procedure: Lower Extremity Angiography;  Surgeon: Lorretta Harp, MD;  Location: [place] [facility];  Service: Cardiovascular;  Laterality: N/A;  . PERIPHERAL ATHRECTOMY  02/11/2017  . PERIPHERAL VASCULAR ATHERECTOMY Left 02/11/2017   Procedure: Peripheral Vascular Atherectomy;  Surgeon: Lorretta Harp, MD;  Location: [place] [facility];  Service: Cardiovascular;  Laterality: Left;  . PERIPHERAL VASCULAR BALLOON ANGIOPLASTY Left 01/16/2019   Procedure: PERIPHERAL VASCULAR BALLOON ANGIOPLASTY;  Surgeon: [name], MD;  Location: [place] [facility];  Service: Cardiovascular;  Laterality: Left;  central vein  . REVISON OF ARTERIOVENOUS FISTULA Left 5/91/6384   Procedure: PLICATION OF LEFT BRACHIOCEPHALIC ARTERIOVENOUS FISTULA;  Surgeon: Conrad Goldville, MD;  Location: [place];  Service: Vascular;  Laterality: Left;  . SHUNTOGRAM N/A 12/09/2012   Procedure: fistulogram;  Surgeon: Serafina Mitchell, MD;  Location: [name];  Service: Cardiovascular;  Laterality: N/A;  . SHUNTOGRAM Left 06/03/2013   Procedure: Fistulogram;  Surgeon: Serafina Mitchell, MD;  Location: [facility] CATH LAB;  Service: Cardiovascular;  Laterality: Left;  . THROMBECTOMY W/ EMBOLECTOMY Left 12/11/2012   Procedure: THROMBECTOMY ARTERIOVENOUS FISTULA;  Surgeon: Serafina Mitchell, MD;  Location: [place];  Service: Vascular;  Laterality: Left;  . TONSILLECTOMY    . WOUND DEBRIDEMENT Right 02/11/2019   Procedure: DEBRIDEMENT WOUND RIGHT BELOW THE KNEE STUMP;  Surgeon: Serafina Mitchell, MD;  Location: [facility] OR;  Service: Vascular;  Laterality: Right;   Family History   Family History  Problem Relation Age of Onset  . Heart disease Father        Heart Disease before age 73  . Healthy Daughter   . Healthy Daughter    Social History  reports that he has been smoking cigarettes. He has a 5.00 pack-year smoking history. He has never used smokeless tobacco. He reports current drug use. Drug: Marijuana. He reports that he does not drink alcohol. Allergies  Allergies  Allergen Reactions  . Ace Inhibitors     Unknown reaction, tolerates lisinopril   . Clonidine     Unknown reaction   . Morphine And Related Other (See Comments)    Not effective  . Oxytocin     unknown   Home medications Prior to Admission medications   Medication Sig Start Date End Date Taking? Authorizing Provider  ARTIFICIAL TEAR OP Place 1 drop into both eyes every 6 (six) hours as needed (dry eyes).   Yes [provider]  atorvastatin (LIPITOR) 80 MG tablet Take 1 tablet (80 mg total) by mouth at bedtime. Patient taking differently: Take 80 mg by mouth daily.  12/18/18  Yes Angiulli, Lavon Paganini, PA-C  calcium acetate (PHOSLO) 667 MG capsule Take 4 capsules (2,668 mg total) by mouth 3 (three) times daily with meals. Patient taking differently: Take 1,334-2,001 mg by mouth See admin instructions. Take 2001 mg with meals and 1334 with each snack 12/19/18  Yes Angiulli, Lavon Paganini, PA-C  cinacalcet (SENSIPAR) 30 MG tablet Take 1 tablet (30 mg total) by mouth every evening. 12/19/18  Yes Angiulli, Lavon Paganini, PA-C  clopidogrel (PLAVIX) 75 MG tablet Take 1 tablet (75 mg total) by mouth daily. 12/18/18  Yes Angiulli, Lavon Paganini, PA-C  diphenhydrAMINE (BENADRYL) 25 MG tablet Take 25 mg by mouth daily as needed for itching.   Yes [provider]  DULoxetine (CYMBALTA) 30 MG capsule Take 30 mg by mouth daily. 03/15/20  Yes [provider]  ibuprofen (ADVIL) 200 MG tablet Take 400 mg by mouth 3 (three) times daily.    Yes [provider]  levothyroxine (SYNTHROID) 125 MCG tablet  Take 1 tablet (125 mcg total) by mouth daily before breakfast. Patient taking differently: Take 125 mcg by mouth at bedtime.  12/18/18  Yes Angiulli, Lavon Paganini, PA-C  lisinopril (ZESTRIL) 10 MG tablet Take 10 mg by mouth 4 (four) times a week. Take 10 mg daily on non dialysis days (Sun, Mon, Wed, and Fri)   Yes [provider]  Melatonin 10 MG CAPS Take 10 mg by mouth at bedtime.   Yes [provider]  midodrine (PROAMATINE) 10 MG tablet Take 10 mg by mouth Every Tuesday,Thursday,and Saturday with dialysis. 02/25/20  Yes [provider]  multivitamin (RENA-VIT) TABS tablet Take 1 tablet by mouth daily. 10/11/19  Yes [provider]  pregabalin (LYRICA) 150 MG capsule Take 1 capsule (150 mg total) by mouth 3 (three) times daily. 12/18/18  Yes Angiulli, Lavon Paganini, PA-C  sevelamer carbonate (RENVELA) 800 MG tablet Take 1,600-3,200 mg by mouth See admin instructions. Take 3200 mg with each meal and 1600 mg with each snack 10/11/19  Yes [provider]  TOUJEO SOLOSTAR 300 UNIT/ML SOPN Inject 54-78 Units as directed at bedtime as needed (if blood sugar is 130 or higher).  01/12/19  Yes [provider]  albuterol (VENTOLIN HFA) 108 (90 Base) MCG/ACT inhaler Inhale 2 puffs into the lungs every 6 (six) hours as needed for wheezing or shortness of breath.     [provider]  bismuth subsalicylate (PEPTO BISMOL) 262 MG/15ML suspension Take 30 mLs by mouth every 6 (six) hours as needed for indigestion.    [provider]  polyethylene glycol (MIRALAX / GLYCOLAX) 17 g packet Take 17 g by mouth daily as needed for moderate constipation.    [provider]     Vitals:   04/04/20 1600 04/04/20 1630 04/04/20 1700 04/04/20 1722  BP: 106/60 116/62 (!) 112/59   Pulse: 62 60 62   Resp: 13 12 11    Temp:    (!) 97.2 F (36.2 C)  TempSrc:      SpO2: 99% 100% 97%   Weight:      Height:       Exam Gen alert, postop seen in PACU, no  distress No rash, cyanosis or gangrene Sclera anicteric, throat clear  No jvd or bruits Chest clear bilat to bases no rales RRR no MRG Abd soft ntnd no mass or  ascites +bs GU normal male MS no joint effusions Ext R BKA, LLE 1+ edema, post op wounds from ankle to L groin Neuro is alert, Ox 3 , nf LUE AVF+bruit    Home meds:  - phoslo 2-3 ac tid/ sensipar 30 hs/ synthroid 125/ renvela 2-4ac  - toujeo 54- 78u qhs sq prn  - lyrica 150 tid/ cymbalta 30 qd  - lipitor 80/ plavix 75/ lisinopril 10mg  non HD days/ midodrine 10 tts    OP HD: [facility] pt, will get records in am (l2020 was 4.5 h LUE AVF , hep 2000 u)     Assessment/ Plan: 1. LLE ischemia - sp surgical revasc today 8/23 per [facility] 2. ESRD - on HD in [place], [place]. Plan HD here tomorrow. Will follow.  3. BP/ volume - cont home meds, get records for dry wt in am. Taking acei and midodrine, has some volume w/ LE edema today.  4. IDDM - per primary team 5. PAD sp R BKA 6. Anemia ckd - get records.      Kelly Splinter  MD 04/04/2020, 5:28 PM  Recent Labs  Lab 04/01/20 1438  WBC 5.8  HGB 10.8*   Recent Labs  Lab 04/01/20 1438  K 4.8  BUN 41*  CREATININE 7.89*  CALCIUM 10.1

## 2020-04-04 NOTE — Telephone Encounter (Signed)
-----   Message from Michaelyn Barter, RN sent at 04/01/2020  5:05 PM EDT ----- Patient's wife (DPR) is aware of results. Will send message to [facility] office to help schedule patient in 3 months.

## 2020-04-04 NOTE — Progress Notes (Signed)
Pt received from PACU. VSS. L leg incisions clean, dry and intact. Pt oriented to room and unit. Call light in reach.  Clyde Canterbury, RN

## 2020-04-04 NOTE — Op Note (Signed)
Procedure: Left common femoral endarterectomy, left femoral to below-knee popliteal bypass with nonreversed left greater saphenous vein  Preoperative diagnosis: Nonhealing wound left foot  Postoperative diagnosis: Same  Anesthesia: General  Assistant: Gae Gallop, MD, Arlee Muslim, PA-C    Dr. Scot Dock assisted in harvesting of the saphenous vein femoral endarterectomy and exposure of the below-knee popliteal artery to expedite the procedure.  Arlee Muslim was responsible for assistance in retraction creation of the below-knee popliteal anastomosis as well as expediting the procedure.  Operative findings: 1.  Severe calcific atherosclerosis  2.  Long proximal anastomosis to incorporate 3 cm segment of left common femoral endarterectomy  3.  Posterior tibial Doppler signal at conclusion of case  4.  Good quality saphenous vein 4 mm diameter  5.  Graft is tunneled subsartorial but the first 7 cm of this is in the subcutaneous position and fairly superficial to the first saphenectomy incision below the groin incision.  Specimens: Left groin lymph node  Operative details: After pain informed consent, the patient was taken the operating.  The patient was placed in supine vision operating table.  After induction general anesthesia endotracheal ovation Foley catheter was placed.  Next patient's entire left lower extremities prepped and draped usual sterile fashion.  Longitudinal incision made left groin carried down to subcutaneous tissues to the level of the left common femoral artery.  There were a large number of lymphatics overlying the left common femoral and the left greater saphenous vein.  These were all mobilized from medial to lateral.  A segment of 1 of these was sent to pathology as specimen.  Next the common femoral artery was dissected free circumferentially.  It was severely calcified.  I dissected out the profunda and superficial femoral arteries as well and Vesseloops were placed  around these.  To reach a segment of common femoral artery that was reasonable for clamping I had to dissect out the distal external iliac artery up underneath the inguinal ligament above the level of the circumflex iliac branch.  A Vesseloops placed around the external at this level as well as around the circumflex iliac branch.  Attention was then turned to the medial aspect of the incision.  Greater saphenous vein was then harvested through the medial aspect of the incision and through several skip incisions down the leg.  It was of good quality about 4 mm in diameter.  Side branches were ligated and divided tween silk ties or clips.  At the area of the saphenous vein below the knee the incision was deepened in the fashion the below-knee popliteal artery was dissected free circumferentially.  The origins of the tibial vessels were heavily calcified.  There was a soft segment on the below-knee popliteal artery.  It was quite thickened.  The vein was harvested to about 5 cm below this incision.  The vein was then transected distally and ligated with a 2-0 silk tie.  The proximal aspect of the vein was oversewn at the saphenofemoral junction with a running 5-0 Prolene suture.  The vein was gently distended with heparinized saline and checked for hemostasis and put aside and heparinized saline until the bypass was ready for use.  Next a deep tunnel was created between the heads of the gastrocnemius muscle and the below-knee incision up to the level of the groin.  I intended to put this completely subsartorial but there was a segment at the very top end of the anastomosis for about the first 7 cm of vein is in  the subcutaneous rather than subsartorial position.  Patient was then given 13,000 units of heparin to achieve an ACT greater than 250.  He was given an additional 10,000 is of heparin during the course of the case.  ACT was confirmed to be greater than 250 during the course of the case.  After 2 minutes of  circulation time the left distal external iliac artery was controlled with a Henley clamp.  The other branches were controlled with Vesseloops.  A longitudinal opening was made in the anterior surface of the left common femoral artery.  There was a large amount of calcific plaque nearly obstructing the lumen of the artery.  I performed a common femoral endarterectomy extending from the femoral bifurcation all the way up underneath the inguinal ligament.  A reasonable proximal endpoint was obtained although there was still calcific disease.  I did an eversion endarterectomy of the superficial femoral artery.  This was to allow for creation of the anastomosis as the superficial femoral artery is chronically occluded.  There was a large calcific plaque obstructing the origin of the profunda and this was also removed under direct vision.  All these vessels had good backbleeding afterwards.  Next the vein was placed in a nonreverse configuration and spatulated and sewn end of graft to side of common femoral artery using a running 6-0 Prolene suture.  Despite completion anastomosis it was for blood backbled and thoroughly flushed reanastomosed was secured clamps released there were a few bleeding areas that were repaired with a few repair sutures.  This was then packed with Avitene and the vein straightened out and the valvulotome passed through it.  Multiple passes were made to all light valves were lysed and then the vein was then marked for orientation.  It was then brought through the subsartorial tunnel down to the below-knee popliteal artery.  The distal popliteal artery had to be controlled with Henley clamp due to its calcification.  It was also controlled proximally with a Henley clamp.  Longitudinal opening was made in the popliteal artery.  It was heavily diseased with atherosclerotic plaque.  It was quite friable in the internal intimal surface and very thickened.  The leg was straightened and the vein graft  cut to length and spatulated and sewn end of vein to side of artery using a running 6-0 Prolene suture.  Despite completion of the anastomosis it was for blood backbled and thoroughly flushed.  Estimates was secured clamps released was pulsatile flow in the distal below-knee popliteal artery and good Doppler flow.  There is also posterior tibial Doppler signals that augmented about 70% with unclamping of the graft.  Next hemostasis was obtained with the assistance of direct pressure and 100 mg of protamine.  1 additional repair stitch was placed in the distal anastomosis to secure hemostasis.  After hemostasis had been obtained all of the incisions were closed with multiple running layers of 3-0 Vicryl suture in the deep layers followed by a 4-0 Vicryl subcuticular stitch in the skin.  The groin was inspected and found to be hemostatic and closed in multiple layers with running 2 oh followed by multiple layers of running 3 oh and finally a 4-0 Vicryl subcuticular stitch in the skin.  Dermabond was applied to all the skin incisions.  The patient tired procedure well and there were no complications.  The instrument sponge and needle count was correct end of the case.  The patient was taken to recovery room stable condition.  [name],  MD [facility] Office: [phone] \

## 2020-04-04 NOTE — Progress Notes (Signed)
Patient seen in PACU no hematoma left leg brisk posterior tibial nearly biphasic Doppler signal mono to biphasic anterior tibial Doppler signal.  Patient awake and alert  To 4 E. when bed available  [name], MD [facility] Office: [phone]

## 2020-04-04 NOTE — Anesthesia Postprocedure Evaluation (Signed)
Anesthesia Post Note  Patient: [name]  Procedure(s) Performed: ENDARTERECTOMY COMMON FEMORAL (Left Groin) FEMORAL- BELOW KNEE POPLITEAL BYPASS GRAFT NON REVERSED VEIN (Left Leg Upper)     Patient location during evaluation: PACU Anesthesia Type: General Level of consciousness: awake and alert Pain management: pain level controlled Vital Signs Assessment: post-procedure vital signs reviewed and stable Respiratory status: spontaneous breathing, nonlabored ventilation and respiratory function stable Cardiovascular status: blood pressure returned to baseline and stable Postop Assessment: no apparent nausea or vomiting Anesthetic complications: no   No complications documented.  Last Vitals:  Vitals:   04/04/20 1500 04/04/20 1515  BP: 113/63 (!) 114/57  Pulse: 68 70  Resp: 12 14  Temp:    SpO2: 100% 94%    Last Pain:  Vitals:   04/04/20 1500  TempSrc:   PainSc: [place]

## 2020-04-04 NOTE — Interval H&P Note (Signed)
History and Physical Interval Note:  04/04/2020 7:27 AM  [name]  has presented today for surgery, with the diagnosis of LEFT FEMORAL ARTERY STENOSIS.  The various methods of treatment have been discussed with the patient and family. After consideration of risks, benefits and other options for treatment, the patient has consented to  Procedure(s): ENDARTERECTOMY FEMORAL (Left) FEMORAL- BELOW KNEE POPLITEAL BYPASS GRAFT (Left) as a surgical intervention.  The patient's history has been reviewed, patient examined, no change in status, stable for surgery.  I have reviewed the patient's chart and labs.  Questions were answered to the patient's satisfaction.     [name]

## 2020-04-04 NOTE — Anesthesia Procedure Notes (Signed)
Arterial Line Insertion Performed by: Milford Cage, CRNA, CRNA  Patient location: Pre-op. Preanesthetic checklist: patient identified, IV checked, site marked, risks and benefits discussed, surgical consent, monitors and equipment checked, pre-op evaluation and anesthesia consent Lidocaine 1% used for infiltration Right, radial was placed Catheter size: 20 G Hand hygiene performed , maximum sterile barriers used  and Seldinger technique used  Attempts: 1 Procedure performed without using ultrasound guided technique. Following insertion, dressing applied and Biopatch. Post procedure assessment: normal and unchanged  Patient tolerated the procedure well with no immediate complications.

## 2020-04-04 NOTE — Telephone Encounter (Signed)
3 month f/u scheduled - former Dr Bronson Ing pt

## 2020-04-04 NOTE — Anesthesia Procedure Notes (Signed)
Procedure Name: Intubation Performed by: Milford Cage, CRNA Pre-anesthesia Checklist: Patient identified, Emergency Drugs available, Suction available and Patient being monitored Patient Re-evaluated:Patient Re-evaluated prior to induction Oxygen Delivery Method: Circle System Utilized Preoxygenation: Pre-oxygenation with 100% oxygen Induction Type: IV induction Ventilation: Mask ventilation with difficulty, Oral airway inserted - appropriate to patient size and Two handed mask ventilation required Laryngoscope Size: Miller and 3 Grade View: Grade I Tube type: Oral Tube size: 7.5 mm Number of attempts: 2 Airway Equipment and Method: Stylet and Oral airway Placement Confirmation: ETT inserted through vocal cords under direct vision,  positive ETCO2 and breath sounds checked- equal and bilateral Secured at: 24 cm Tube secured with: Tape Dental Injury: Teeth and Oropharynx as per pre-operative assessment  Comments: G2b view with MAC 4. G1 view with Mil 3.

## 2020-04-05 ENCOUNTER — Encounter (HOSPITAL_COMMUNITY): Payer: Self-pay | Admitting: Vascular Surgery

## 2020-04-05 LAB — BASIC METABOLIC PANEL
Anion gap: 14 (ref 5–15)
BUN: 60 mg/dL — ABNORMAL HIGH (ref 8–23)
CO2: 25 mmol/L (ref 22–32)
Calcium: 9.5 mg/dL (ref 8.9–10.3)
Chloride: 98 mmol/L (ref 98–111)
Creatinine, Ser: 10.15 mg/dL — ABNORMAL HIGH (ref 0.61–1.24)
GFR calc Af Amer: 5 mL/min — ABNORMAL LOW (ref >60)
GFR calc non Af Amer: 5 mL/min — ABNORMAL LOW (ref >60)
Glucose, Bld: 165 mg/dL — ABNORMAL HIGH (ref 70–99)
Potassium: 5.3 mmol/L — ABNORMAL HIGH (ref 3.5–5.1)
Sodium: 137 mmol/L (ref 135–145)

## 2020-04-05 LAB — CBC
HCT: 23.7 % — ABNORMAL LOW (ref 39.0–52.0)
Hemoglobin: 7.9 g/dL — ABNORMAL LOW (ref 13.0–17.0)
MCH: 35.3 pg — ABNORMAL HIGH (ref 26.0–34.0)
MCHC: 33.3 g/dL (ref 30.0–36.0)
MCV: 105.8 fL — ABNORMAL HIGH (ref 80.0–100.0)
Platelets: 160 10*3/uL (ref 150–400)
RBC: 2.24 MIL/uL — ABNORMAL LOW (ref 4.22–5.81)
RDW: 14.9 % (ref 11.5–15.5)
WBC: 7.1 10*3/uL (ref 4.0–10.5)
nRBC: 0 % (ref 0.0–0.2)

## 2020-04-05 LAB — GLUCOSE, CAPILLARY
Glucose-Capillary: 157 mg/dL — ABNORMAL HIGH (ref 70–99)
Glucose-Capillary: 118 mg/dL — ABNORMAL HIGH (ref 70–99)
Glucose-Capillary: 101 mg/dL — ABNORMAL HIGH (ref 70–99)
Glucose-Capillary: 131 mg/dL — ABNORMAL HIGH (ref 70–99)
Glucose-Capillary: 112 mg/dL — ABNORMAL HIGH (ref 70–99)

## 2020-04-05 LAB — LIPID PANEL
Cholesterol: 60 mg/dL (ref 0–200)
HDL: 17 mg/dL — ABNORMAL LOW (ref >40)
LDL Cholesterol: 12 mg/dL (ref 0–99)
Total CHOL/HDL Ratio: 3.5 RATIO
Triglycerides: 155 mg/dL — ABNORMAL HIGH (ref <150)
VLDL: 31 mg/dL (ref 0–40)

## 2020-04-05 LAB — POCT I-STAT, CHEM 8
BUN: 47 mg/dL — ABNORMAL HIGH (ref 8–23)
Calcium, Ion: 1.17 mmol/L (ref 1.15–1.40)
Chloride: 99 mmol/L (ref 98–111)
Creatinine, Ser: 8.5 mg/dL — ABNORMAL HIGH (ref 0.61–1.24)
Glucose, Bld: 199 mg/dL — ABNORMAL HIGH (ref 70–99)
HCT: 28 % — ABNORMAL LOW (ref 39.0–52.0)
Hemoglobin: 9.5 g/dL — ABNORMAL LOW (ref 13.0–17.0)
Potassium: 5.8 mmol/L — ABNORMAL HIGH (ref 3.5–5.1)
Sodium: 134 mmol/L — ABNORMAL LOW (ref 135–145)
TCO2: 26 mmol/L (ref 22–32)

## 2020-04-05 LAB — POCT ACTIVATED CLOTTING TIME
Activated Clotting Time: 230 seconds
Activated Clotting Time: 318 seconds

## 2020-04-05 LAB — HEPATITIS B SURFACE ANTIBODY,QUALITATIVE: Hep B S Ab: REACTIVE — AB

## 2020-04-05 LAB — HEPATITIS B SURFACE ANTIGEN: Hepatitis B Surface Ag: NONREACTIVE

## 2020-04-05 LAB — HEPATITIS B CORE ANTIBODY, TOTAL: Hep B Core Total Ab: NONREACTIVE

## 2020-04-05 LAB — SURGICAL PATHOLOGY

## 2020-04-05 MED ORDER — HYDROMORPHONE HCL 1 MG/ML IJ SOLN
INTRAMUSCULAR | Status: AC
  Administered 2020-04-05: 0.5 mg via INTRAVENOUS
  Filled 2020-04-05: qty 0.5

## 2020-04-05 MED ORDER — HYDROMORPHONE HCL 1 MG/ML IJ SOLN
INTRAMUSCULAR | Status: AC
  Filled 2020-04-05: qty 0.5

## 2020-04-05 NOTE — Progress Notes (Signed)
Pt received from HD. BP 92/49 (63). Pt complaining that his hearing is muffled and he has not been able to hear in a few hours. Scheduled midodrine given. Blood glucose 101. Will continue to monitor.  Clyde Canterbury, RN

## 2020-04-05 NOTE — Progress Notes (Signed)
OT Cancellation Note  Patient Details Name: [name] MRN: [id number] DOB: Jan 26, 1951   Cancelled Treatment:    Reason Eval/Treat Not Completed: Patient at procedure or test/ unavailable (HD), will follow up as able.  Lou Cal, OT Acute Rehabilitation Services Pager [phone] Office [phone]   Raymondo Band 04/05/2020, 8:11 AM

## 2020-04-05 NOTE — Progress Notes (Signed)
[facility] Progress Note  Subjective: seen in HD no c/o  Vitals:   04/05/20 0730 04/05/20 0800 04/05/20 0830 04/05/20 0900  BP: (!) 99/52 (!) 127/43 (!) 91/36 (!) 108/48  Pulse: 62 77 79 61  Resp: 16 16 16 14   Temp:      TempSrc:      SpO2:      Weight:      Height:        Exam: Gen seen on HD, listless, MS improved after NS bolus and turning off the UF No jvd or bruits Chest clear bilat to bases no rales RRR no MRG Abd soft ntnd no mass or ascites +bs Ext R BKA,  post op wounds from ankle to L groin, no edema Neuro groggy but responsive LUE AVF+bruit    Home meds:  - phoslo 2-3 ac tid/ sensipar 30 hs/ synthroid 125/ renvela 2-4ac  - toujeo 54- 78u qhs sq prn  - lyrica 150 tid/ cymbalta 30 qd  - lipitor 80/ plavix 75/ lisinopril 10mg  non HD days/ midodrine 10 tts    OP HD: [facility]    4.5h   111.5 Kg   Heparin 2000 + 1000u/hr    LUE AVF      Assessment/ Plan: 1. LLE ischemia - sp surgical revasc / fem-pop bypass graft 8/23 2. ESRD - TTS on HD in [place], [place]. HD today. 3. BP/ volume - cont home meds, taking acei and midodrine at home. Not tolerating UF on HD this am. Is 2-3kg up by wts. Turning off UF now for low BP w/ dec'd LOC which has resolved.  4. IDDM - per primary team 5. PAD sp R BKA 6. Anemia ckd - Hb 7.9 post op this am, was 9.6 pre-op. Transfuse prn Hb < 7.0     Rob Adaline Trejos 04/05/2020, 10:25 AM   Recent Labs  Lab 04/01/20 1438 04/01/20 1438 04/04/20 1237 04/04/20 1237 04/04/20 1830 04/05/20 0428  K 4.8   < > 5.8*  --   --  5.3*  BUN 41*   < > 47*  --   --  60*  CREATININE 7.89*   < > 8.50*   < > 9.60* 10.15*  CALCIUM 10.1  --   --   --   --  9.5  HGB 10.8*   < > 9.5*   < > 9.6* 7.9*   < > = values in this interval not displayed.   Inpatient medications: . atorvastatin  80 mg Oral Daily  . calcium acetate  1,334 mg Oral With snacks  . calcium acetate  2,001 mg Oral TID with meals  . cinacalcet  30 mg Oral  QPM  . clopidogrel  75 mg Oral Daily  . DULoxetine  30 mg Oral Daily  . heparin  5,000 Units Subcutaneous Q8H  . HYDROmorphone      . insulin aspart  0-15 Units Subcutaneous TID WC  . levothyroxine  125 mcg Oral QAC breakfast  . [START ON 04/07/2020] lisinopril  10 mg Oral Once per day on Sun Tue Thu Sat  . melatonin  10 mg Oral QHS  . midodrine  10 mg Oral Q T,Th,Sa-HD  . pantoprazole  40 mg Oral Daily  . pregabalin  150 mg Oral TID   . sodium chloride     sodium chloride, acetaminophen **OR** acetaminophen, albuterol, diphenhydrAMINE, guaiFENesin-dextromethorphan, hydrALAZINE, HYDROmorphone (DILAUDID) injection, labetalol, metoprolol tartrate, ondansetron, oxyCODONE-acetaminophen, phenol, polyethylene glycol

## 2020-04-05 NOTE — Progress Notes (Signed)
Mobility Specialist: Progress Note    04/05/20 1422  Mobility  Activity Dangled on edge of bed  Level of Assistance Moderate assist, patient does 50-74%  Assistive Device None  Mobility Response Tolerated fair  Mobility performed by Mobility specialist  Bed Position High-fowlers  $Mobility charge 1 Mobility   Pre-Mobility: 76 HR, 119/100 BP, 99% SpO2 Post-Mobility: 65 HR, 96/45 BP, 98% SpO2  Assisted pt to EOB. Pt dangled on EOB for a few minutes. Some incision sites on L leg started bleeding a little and pt's IV came out during transition to EOB, RN notified. Assisted pt back in bed so RN could examine incision and IV. Pt c/o of feeling light headed after returning to bed.   [facility] Erva Koke Mobility Specialist

## 2020-04-05 NOTE — Progress Notes (Signed)
[facility] Cancellation Note  Patient Details Name: [name] MRN: [id number] DOB: [birth year]   Cancelled Treatment:    Reason Eval/Treat Not Completed: Patient at procedure or test/unavailable   [name] B Makya Phillis 04/05/2020, 10:31 AM  [name], [name] Pager: [phone] Office: [phone]

## 2020-04-05 NOTE — Progress Notes (Addendum)
  Progress Note    04/05/2020 7:18 AM 1 Day Post-Op  Subjective:  Seen on HD.  L groin incision causing discomfort.  No other complaints.   Vitals:   04/05/20 0300 04/05/20 0400  BP: 127/62 (!) 133/54  Pulse: 64 (!) 59  Resp: 16 15  Temp:  97.6 F (36.4 C)  SpO2: 95% 100%   Physical Exam: Lungs:  Non labored Incisions:  Incisions of LLE c/d/i Extremities:  L foot warm with good capillary refill Neurologic: A&O  CBC    Component Value Date/Time   WBC 7.1 04/05/2020 0428   RBC 2.24 (L) 04/05/2020 0428   HGB 7.9 (L) 04/05/2020 0428   HGB 11.5 (L) 02/08/2017 1409   HCT 23.7 (L) 04/05/2020 0428   HCT 34.3 (L) 02/08/2017 1409   PLT 160 04/05/2020 0428   PLT 208 02/08/2017 1409   MCV 105.8 (H) 04/05/2020 0428   MCV 94 02/08/2017 1409   MCH 35.3 (H) 04/05/2020 0428   MCHC 33.3 04/05/2020 0428   RDW 14.9 04/05/2020 0428   RDW 16.7 (H) 02/08/2017 1409   LYMPHSABS 1.5 12/11/2018 1425   LYMPHSABS 2.1 02/08/2017 1409   MONOABS 0.7 12/11/2018 1425   EOSABS 0.3 12/11/2018 1425   EOSABS 0.2 02/08/2017 1409   BASOSABS 0.0 12/11/2018 1425   BASOSABS 0.0 02/08/2017 1409    BMET    Component Value Date/Time   NA 137 04/05/2020 0428   NA 134 02/08/2017 1403   K 5.3 (H) 04/05/2020 0428   CL 98 04/05/2020 0428   CO2 25 04/05/2020 0428   GLUCOSE 165 (H) 04/05/2020 0428   BUN 60 (H) 04/05/2020 0428   BUN 67 (H) 02/08/2017 1403   CREATININE 10.15 (H) 04/05/2020 0428   CALCIUM 9.5 04/05/2020 0428   GFRNONAA 5 (L) 04/05/2020 0428   GFRAA 5 (L) 04/05/2020 0428    INR    Component Value Date/Time   INR 1.2 04/01/2020 1438     Intake/Output Summary (Last 24 hours) at 04/05/2020 0718 Last data filed at 04/05/2020 0316 Gross per 24 hour  Intake 1850 ml  Output 600 ml  Net 1250 ml     Assessment/Plan:  69 y.o. male is s/p L CFA endarterectomy and femoral to BK popliteal bypass with vein 1 Day Post-Op   L foot is warm with good capillary refill PT/OT eval today TOC  consulted to resume HH at discharge L ABI pending   Dagoberto Ligas, PA-C [facility] [phone] 04/05/2020 7:18 AM  Agree with above.  Try to mobilize some after HD today.  [name], MD [facility] [place] Office: [phone]

## 2020-04-06 LAB — BASIC METABOLIC PANEL
Anion gap: 11 (ref 5–15)
BUN: 29 mg/dL — ABNORMAL HIGH (ref 8–23)
CO2: 28 mmol/L (ref 22–32)
Calcium: 8.8 mg/dL — ABNORMAL LOW (ref 8.9–10.3)
Chloride: 97 mmol/L — ABNORMAL LOW (ref 98–111)
Creatinine, Ser: 6.58 mg/dL — ABNORMAL HIGH (ref 0.61–1.24)
GFR calc Af Amer: 9 mL/min — ABNORMAL LOW (ref >60)
GFR calc non Af Amer: 8 mL/min — ABNORMAL LOW (ref >60)
Glucose, Bld: 138 mg/dL — ABNORMAL HIGH (ref 70–99)
Potassium: 4.5 mmol/L (ref 3.5–5.1)
Sodium: 136 mmol/L (ref 135–145)

## 2020-04-06 LAB — POCT I-STAT 7, (LYTES, BLD GAS, ICA,H+H)
Acid-Base Excess: 2 mmol/L (ref 0.0–2.0)
Bicarbonate: 27.8 mmol/L (ref 20.0–28.0)
Calcium, Ion: 1.26 mmol/L (ref 1.15–1.40)
HCT: 25 % — ABNORMAL LOW (ref 39.0–52.0)
Hemoglobin: 8.5 g/dL — ABNORMAL LOW (ref 13.0–17.0)
O2 Saturation: 93 %
Potassium: 5.6 mmol/L — ABNORMAL HIGH (ref 3.5–5.1)
Sodium: 135 mmol/L (ref 135–145)
TCO2: 29 mmol/L (ref 22–32)
pCO2 arterial: 50.8 mmHg — ABNORMAL HIGH (ref 32.0–48.0)
pH, Arterial: 7.346 — ABNORMAL LOW (ref 7.350–7.450)
pO2, Arterial: 70 mmHg — ABNORMAL LOW (ref 83.0–108.0)

## 2020-04-06 LAB — GLUCOSE, CAPILLARY
Glucose-Capillary: 136 mg/dL — ABNORMAL HIGH (ref 70–99)
Glucose-Capillary: 158 mg/dL — ABNORMAL HIGH (ref 70–99)
Glucose-Capillary: 165 mg/dL — ABNORMAL HIGH (ref 70–99)
Glucose-Capillary: 165 mg/dL — ABNORMAL HIGH (ref 70–99)

## 2020-04-06 NOTE — Progress Notes (Signed)
[facility] Progress Note  Subjective: seen in room, alert, no c/o  Vitals:   04/06/20 0406 04/06/20 0800 04/06/20 0810 04/06/20 1128  BP: (!) 138/57 (!) 112/51 (!) 112/51 (!) 129/41  Pulse: 78 76 77 84  Resp: 18 12 17 18   Temp: 98.7 F (37.1 C) 98.9 F (37.2 C) 98.4 F (36.9 C) 97.8 F (36.6 C)  TempSrc: Oral Oral Oral Oral  SpO2: 97% 98% 97% 98%  Weight:      Height:        Exam: Gen alert, and no distress No jvd or bruits Chest clear bilat to bases no rales RRR no MRG Abd soft ntnd no mass or ascites +bs Ext R BKA,  post op wounds from ankle to L groin, no edema Neuro nonfocal LUE AVF+bruit    Home meds:  - phoslo 2-3 ac tid/ sensipar 30 hs/ synthroid 125/ renvela 2-4ac  - toujeo 54- 78u qhs sq prn  - lyrica 150 tid/ cymbalta 30 qd  - lipitor 80/ plavix 75/ lisinopril 10mg  non HD days/ midodrine 10 tts    OP HD: [facility]    4.5h   111.5 Kg   Heparin 2000 + 1000u/hr    LUE AVF      Assessment/ Plan: 1. LLE ischemia - sp surgical revasc / fem-pop bypass graft 8/23 2. ESRD - TTS on HD in [place], [place]. HD today. 3. BP/ volume - cont home meds, taking acei and midodrine at home. Did not tolerate UF on HD yesterday. Gentle UF w/ HD tomorrow 1-2 L goal.  4. IDDM - per primary team 5. PAD sp R BKA 6. Anemia ckd - Hb 7.9 post op this am, was 9.6 pre-op. Transfuse prn Hb < 7.0. check in am.      Rob [place] 04/06/2020, 1:05 PM   Recent Labs  Lab 04/04/20 1237 04/04/20 1830 04/05/20 0428 04/06/20 0322  K   < >  --  5.3* 4.5  BUN   < >  --  60* 29*  CREATININE   < > 9.60* 10.15* 6.58*  CALCIUM  --   --  9.5 8.8*  HGB  --  9.6* 7.9*  --    < > = values in this interval not displayed.   Inpatient medications: . atorvastatin  80 mg Oral Daily  . calcium acetate  1,334 mg Oral With snacks  . calcium acetate  2,001 mg Oral TID with meals  . cinacalcet  30 mg Oral QPM  . clopidogrel  75 mg Oral Daily  . DULoxetine  30 mg Oral  Daily  . heparin  5,000 Units Subcutaneous Q8H  . insulin aspart  0-15 Units Subcutaneous TID WC  . levothyroxine  125 mcg Oral QAC breakfast  . [START ON 04/07/2020] lisinopril  10 mg Oral Once per day on Sun Tue Thu Sat  . melatonin  10 mg Oral QHS  . midodrine  10 mg Oral Q T,Th,Sa-HD  . pantoprazole  40 mg Oral Daily  . pregabalin  150 mg Oral TID   . sodium chloride     sodium chloride, acetaminophen **OR** acetaminophen, albuterol, diphenhydrAMINE, guaiFENesin-dextromethorphan, hydrALAZINE, HYDROmorphone (DILAUDID) injection, labetalol, metoprolol tartrate, ondansetron, oxyCODONE-acetaminophen, phenol, polyethylene glycol

## 2020-04-06 NOTE — Progress Notes (Signed)
Rehab Admissions Coordinator Note:  Patient was screened by Cleatrice Burke for appropriateness for an Inpatient Acute Rehab Consult per PT recs. Previously at [facility] 11/2018.  At this time, we are recommending Inpatient Rehab consult. Please place rehab consult if patient would like to be considered for admit prior to d/c home.  Cleatrice Burke RN MSN 04/06/2020, 5:22 PM  I can be reached at [phone].

## 2020-04-06 NOTE — Progress Notes (Addendum)
  Progress Note    04/06/2020 7:30 AM 2 Days Post-Op  Subjective:  Still some left groin and incisional discomfort. No other major complaints   Vitals:   04/06/20 0002 04/06/20 0406  BP: (!) 112/53 (!) 138/57  Pulse: 78 78  Resp: 15 18  Temp: 98 F (36.7 C) 98.7 F (37.1 C)  SpO2: 96% 97%   Physical Exam: Cardiac:  regular Lungs: non labored Incisions: Left groin and leg incisions are intact clean and dry. Dry gauze applied to proximal thigh and groin incisions. Left heel, posterior leg and lateral leg wounds stable Extremities: 2+ femoral pulse, left leg warm and well perfused. Brisk Doppler DP, PT, AT signals Abdomen:  Neurologic: alert and oriented  CBC    Component Value Date/Time   WBC 7.1 04/05/2020 0428   RBC 2.24 (L) 04/05/2020 0428   HGB 7.9 (L) 04/05/2020 0428   HGB 11.5 (L) 02/08/2017 1409   HCT 23.7 (L) 04/05/2020 0428   HCT 34.3 (L) 02/08/2017 1409   PLT 160 04/05/2020 0428   PLT 208 02/08/2017 1409   MCV 105.8 (H) 04/05/2020 0428   MCV 94 02/08/2017 1409   MCH 35.3 (H) 04/05/2020 0428   MCHC 33.3 04/05/2020 0428   RDW 14.9 04/05/2020 0428   RDW 16.7 (H) 02/08/2017 1409   LYMPHSABS 1.5 12/11/2018 1425   LYMPHSABS 2.1 02/08/2017 1409   MONOABS 0.7 12/11/2018 1425   EOSABS 0.3 12/11/2018 1425   EOSABS 0.2 02/08/2017 1409   BASOSABS 0.0 12/11/2018 1425   BASOSABS 0.0 02/08/2017 1409    BMET    Component Value Date/Time   NA 136 04/06/2020 0322   NA 134 02/08/2017 1403   K 4.5 04/06/2020 0322   CL 97 (L) 04/06/2020 0322   CO2 28 04/06/2020 0322   GLUCOSE 138 (H) 04/06/2020 0322   BUN 29 (H) 04/06/2020 0322   BUN 67 (H) 02/08/2017 1403   CREATININE 6.58 (H) 04/06/2020 0322   CALCIUM 8.8 (L) 04/06/2020 0322   GFRNONAA 8 (L) 04/06/2020 0322   GFRAA 9 (L) 04/06/2020 0322    INR    Component Value Date/Time   INR 1.2 04/01/2020 1438     Intake/Output Summary (Last 24 hours) at 04/06/2020 0730 Last data filed at 04/05/2020 2104 Gross  per 24 hour  Intake 222 ml  Output --  Net 222 ml     Assessment/Plan:  69 y.o. male is s/p left CFA endarterectomy and femoral to below knee popliteal bypass with vein 2 Days Post-Op. Doing well post op. Left leg well perfused and warm with brisk doppler PT/ DP/ At signals. Keep dry gauze in groin incisions.  L ABI pending. HD per nephrology. PT/OT eval today- was not done yesterday due to HD.  DVT prophylaxis:  Sq. Heparin  Karoline Caldwell, PA-C [facility] [phone] 04/06/2020 7:30 AM   Agree with above.  Had some drainage from  Incisions yesterday when up. Work on mobilization today.  Foot warm patent graft. Continue local wound care and float heel. Possible d/c tomorrow or Friday.  [name], MD [facility] [place] Office: [phone]

## 2020-04-06 NOTE — Consult Note (Signed)
   [facility] Inpatient Consult   04/06/2020  [name] 1951/01/02 [id number]  [place] [facility] [ACO] Patient: Medicare NextGen  Patient is currently active with [facility] for chronic disease management services.  Patient has been engaged by a [place] community based plan of care has had difficulty maintaining contact with the patient.  Plan: Made Inpatient Transition Of Care [TOC] team member to make aware that [facility] following.   Of note, [facility] Care Management services does not replace or interfere with any services that are needed or arranged by inpatient [facility] care management team.  For additional questions or referrals please contact:  Natividad Brood, RN BSN [facility] Liaison  [phone] business mobile phone Toll free office [phone]  Fax number: [phone] [email or web address]

## 2020-04-06 NOTE — Evaluation (Signed)
Occupational Therapy Evaluation Patient Details Name: [name] MRN: [id number] DOB: 04-04-1951 Today's Date: 04/06/2020    History of Present Illness 69 yo admitted for left fem-pop BPG. PMhx: Rt AKA 12/01/18, DM, ESRD, PAD, CAD, COPD, HTN   Clinical Impression   At baseline, Pt mostly uses WC for mobility. His wife assists with LB ADL getting them started, and then he can complete on his own. Today Pt is mod to max A for LB ADL due to pain at incision site, and he was unable to achieve standing from EOB (min guard to get there) despite elevated bed and attempts x3. Very minimal oozing noted from top incision and bottom incision at that time. He acknowledges that he needs to be able to transfer to be able to go home, and is very motivated. PT notified to try and bring +2 assist to today's session for OOB. AT this time, recommending SNF (since he could not stand/transfer this session) with the hopes that he will progress to [place]. Next session to focus on OOB transfers and LB ADL.     Follow Up Recommendations  SNF;Home health OT (hopeful to progress to [facility])    Equipment Recommendations  None recommended by OT (Pt has appropriate DME)    Recommendations for Other Services PT consult     Precautions / Restrictions Precautions Precautions: Fall Precaution Comments: old R AKA Restrictions Weight Bearing Restrictions: No      Mobility Bed Mobility Overal bed mobility: Needs Assistance Bed Mobility: Supine to Sit;Sit to Supine     Supine to sit: Min guard;HOB elevated Sit to supine: Min guard   General bed mobility comments: use of rails, Pt able to scoot up in bed as well  Transfers Overall transfer level: Needs assistance Equipment used: Rolling walker (2 wheeled) Transfers: Sit to/from Stand Sit to Stand: Max assist;From elevated surface         General transfer comment: attempted to stand x3 from EOB elevated surface. Pt unable even with assist. After returning  supine, Pt shared that he typically transfers to [facility] with it straight on. Will pass onto PT and hope for better luck in their session    Balance Overall balance assessment: Mild deficits observed, not formally tested                                         ADL either performed or assessed with clinical judgement   ADL Overall ADL's : Needs assistance/impaired Eating/Feeding: Modified independent   Grooming: Set up;Wash/dry hands;Wash/dry face;Oral care;Sitting Grooming Details (indicate cue type and reason): EOB Upper Body Bathing: Set up;Sitting   Lower Body Bathing: Moderate assistance;Sitting/lateral leans   Upper Body Dressing : Set up;Sitting   Lower Body Dressing: Maximal assistance;Sitting/lateral leans;Bed level   Toilet Transfer: Maximal assistance Toilet Transfer Details (indicate cue type and reason): attempted x3 from EOB to squat pivot or stand pivot, and Pt unable with +1 assist     Tub/ Shower Transfer: Tub transfer;Tub bench;Maximal assistance   Functional mobility during ADLs: Maximal assistance;+2 for safety/equipment (transfers only)       Vision Baseline Vision/History: Wears glasses Wears Glasses: Reading only Patient Visual Report: Other (comment) (cloudy vision in R eye, Pt has appointment with eyeMD)       Perception     Praxis      Pertinent Vitals/Pain Pain Assessment: 0-10 Pain Score: 7  Pain  Location: L leg knee to groin Pain Descriptors / Indicators: Constant;Aching Pain Intervention(s): Limited activity within patient's tolerance;Monitored during session;Premedicated before session;Other (comment) (elevation)     Hand Dominance Right   Extremity/Trunk Assessment Upper Extremity Assessment Upper Extremity Assessment: Overall WFL for tasks assessed   Lower Extremity Assessment Lower Extremity Assessment: LLE deficits/detail;RLE deficits/detail RLE Deficits / Details: AKA LLE Deficits / Details: post-op deficits as  anticipated, slight oozing from stitches LLE Coordination: decreased gross motor       Communication Communication Communication: No difficulties   Cognition Arousal/Alertness: Awake/alert Behavior During Therapy: WFL for tasks assessed/performed Overall Cognitive Status: Within Functional Limits for tasks assessed                                 General Comments: Pt verbalizes that he knows he wants to be able to transfer by tomorrow to go home, unsure if Pt is fully aware of new deficits post-op   General Comments       Exercises     Shoulder Instructions      Home Living Family/patient expects to be discharged to:: Private residence Living Arrangements: Spouse/significant other Available Help at Discharge: Family;Available 24 hours/day Type of Home: House Home Access: Ramped entrance     Home Layout: One level;Laundry or work area in [employer] Shower/Tub: Tub/shower unit (with Forensic psychologist)   Bathroom Toilet: Standard (uses Riser over-top) Bathroom Accessibility: Yes How Accessible: Accessible via walker;Other (comment) (uses transport chair to access) Home Equipment: Walker - 2 wheels;Bedside commode;Tub bench;Toilet riser;Grab bars - toilet;Grab bars - tub/shower;Hand held shower head;Wheelchair - manual          Prior Functioning/Environment Level of Independence: Needs assistance  Gait / Transfers Assistance Needed: mobilizes with WC, and transport chair, limited use of RW and does not really use prothesis (not currently in hospital room) ADL's / Homemaking Assistance Needed: min A for LB initiation, otherwise set up for ADL            OT Problem List: Impaired balance (sitting and/or standing);Decreased strength;Decreased safety awareness;Obesity;Pain      OT Treatment/Interventions: Self-care/ADL training;DME and/or AE instruction;Therapeutic activities;Patient/family education;Balance training    OT Goals(Current goals can be  found in the care plan section) Acute Rehab OT Goals Patient Stated Goal: to eventually be able to get back into his basement OT Goal Formulation: With patient Time For Goal Achievement: 04/20/20 Potential to Achieve Goals: Good ADL Goals Pt Will Perform Grooming: sitting;with modified independence Pt Will Perform Upper Body Dressing: with modified independence;sitting Pt Will Perform Lower Body Dressing: with min guard assist;sitting/lateral leans Pt Will Transfer to Toilet: with min guard assist;bedside commode Pt Will Perform Toileting - Clothing Manipulation and hygiene: with set-up;sitting/lateral leans  OT Frequency: Min 2X/week   Barriers to D/C:    home is WC accessible, but Pt needs to be able to transfer to WC       Co-evaluation              AM-PAC OT "6 Clicks" Daily Activity     Outcome Measure Help from another person eating meals?: None Help from another person taking care of personal grooming?: A Little Help from another person toileting, which includes using toliet, bedpan, or urinal?: A Lot Help from another person bathing (including washing, rinsing, drying)?: A Lot Help from another person to put on and taking off regular upper body clothing?: A  Little Help from another person to put on and taking off regular lower body clothing?: A Lot 6 Click Score: 16   End of Session Equipment Utilized During Treatment: Gait belt;Rolling walker Nurse Communication: Mobility status;Precautions  Activity Tolerance: Patient tolerated treatment well Patient left: in bed;with call bell/phone within reach;with bed alarm set  OT Visit Diagnosis: Other abnormalities of gait and mobility (R26.89);Muscle weakness (generalized) (M62.81);Pain Pain - Right/Left: Left Pain - part of body: Leg                Time: 6389-3734 OT Time Calculation (min): 37 min Charges:  OT General Charges $OT Visit: 1 Visit OT Evaluation $OT Eval Moderate Complexity: 1 Mod OT Treatments $Self  Care/Home Management : 8-22 mins  Jesse Sans OTR/L Acute Rehabilitation Services Pager: [phone] Office: [place] 04/06/2020, 10:07 AM

## 2020-04-06 NOTE — Evaluation (Signed)
Physical Therapy Evaluation Patient Details Name: [name] MRN: [id number] DOB: [birth year] Today's Date: 04/06/2020   History of Present Illness  69 yo admitted for left fem-pop BPG. 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  He is in a great deal of discomfort on LLE, but is hoping to go directly home.  Talked with him about the hope for him to get to rehab and get stronger first, but he is likely to refuse a rehab placement despite the challenge of transfers for him.  Follow acutely to work on stand pivot to chair and toward home, but will anticipate a rehab need.    Follow Up Recommendations CIR    Equipment Recommendations  None recommended by PT    Recommendations for Other Services Rehab consult     Precautions / Restrictions Precautions Precautions: Fall Precaution Comments: old R AKA Restrictions Weight Bearing Restrictions: No      Mobility  Bed Mobility Overal bed mobility: Needs Assistance Bed Mobility: Supine to Sit     Supine to sit: Min assist     General bed mobility comments: minor help to get to side of bed due to pain  Transfers Overall transfer level: Needs assistance Equipment used: Rolling walker (2 wheeled);1 person hand held assist Transfers: Sit to/from [employer] Pivot Transfers Sit to Stand: Max assist;From elevated surface (partial stand) Stand pivot transfers: Max assist;From elevated surface Squat pivot transfers: Mod assist (bed level)     General transfer comment: pt did well with assist to pivot to chair with locked device  Ambulation/Gait             General Gait Details: non ambulatory  Stairs            Wheelchair Mobility    Modified Rankin (Stroke Patients Only)       Balance Overall balance assessment: Needs assistance Sitting-balance support:  Single extremity supported Sitting balance-Leahy Scale: Fair     Standing balance support: Bilateral upper extremity supported;During functional activity Standing balance-Leahy Scale: Poor                               Pertinent Vitals/Pain Pain Assessment: 0-10 Pain Score: 7  Pain Location: L leg knee to groin Pain Descriptors / Indicators: Operative site guarding;Grimacing;Guarding Pain Intervention(s): Limited activity within patient's tolerance;Monitored during session;Premedicated before session;Repositioned    Home Living Family/patient expects to be discharged to:: Private residence Living Arrangements: Spouse/significant other Available Help at Discharge: Family;Available 24 hours/day Type of Home: House Home Access: Ramped entrance     Home Layout: One level;Laundry or work area in [place]: Environmental consultant - 2 wheels;Bedside commode;Tub bench;Toilet riser;Grab bars - toilet;Grab bars - tub/shower;Hand held shower head;Wheelchair - manual      Prior Function Level of Independence: Needs assistance   Gait / Transfers Assistance Needed: transfers to [facility], has improved his balance prev to pivot with chair locked and not on prosthetic  ADL's / Homemaking Assistance Needed: has help from wife for bathing and dressing as needed        Hand Dominance   Dominant Hand: Right    Extremity/Trunk Assessment   Upper Extremity Assessment Upper Extremity Assessment: Overall WFL for tasks assessed    Lower Extremity Assessment Lower Extremity Assessment: LLE deficits/detail;RLE  deficits/detail RLE Deficits / Details: AKA LLE Deficits / Details: post-op deficits as anticipated, slight oozing from stitches LLE: Unable to fully assess due to pain LLE Coordination: decreased gross motor    Cervical / Trunk Assessment Cervical / Trunk Assessment: Kyphotic (mild)  Communication   Communication: No difficulties  Cognition Arousal/Alertness:  Awake/alert Behavior During Therapy: WFL for tasks assessed/performed Overall Cognitive Status: Within Functional Limits for tasks assessed                                 General Comments: needs to be more independent to get to chair for home      General Comments General comments (skin integrity, edema, etc.): pt is able to be assisted with pivot to chair and repositioned on pillows, and has seeping on incision on LLE.  Notified nursing      Exercises     Assessment/Plan    PT Assessment Patient needs continued PT services  PT Problem List Decreased strength;Decreased range of motion;Decreased activity tolerance;Decreased balance;Decreased mobility;Decreased coordination;Cardiopulmonary status limiting activity;Decreased skin integrity;Pain       PT Treatment Interventions DME instruction;Gait training;Functional mobility training;Therapeutic activities;Therapeutic exercise;Balance training;Neuromuscular re-education;Patient/family education    PT Goals (Current goals can be found in the Care Plan section)  Acute Rehab PT Goals Patient Stated Goal: get home right away PT Goal Formulation: With patient Time For Goal Achievement: 04/20/20 Potential to Achieve Goals: Good    Frequency Min 4X/week   Barriers to discharge Decreased caregiver support home with wife who cannot lift him    Co-evaluation               AM-PAC PT "6 Clicks" Mobility  Outcome Measure Help needed turning from your back to your side while in a flat bed without using bedrails?: None Help needed moving from lying on your back to sitting on the side of a flat bed without using bedrails?: A Little Help needed moving to and from a bed to a chair (including a wheelchair)?: A Lot Help needed standing up from a chair using your arms (e.g., wheelchair or bedside chair)?: A Lot Help needed to walk in hospital room?: Total Help needed climbing 3-5 steps with a railing? : Total 6 Click  Score: 13    End of Session Equipment Utilized During Treatment: Gait belt Activity Tolerance: Patient tolerated treatment well;Patient limited by pain Patient left: in chair;with call bell/phone within reach Nurse Communication: Mobility status;Other (comment) (incision seeping) PT Visit Diagnosis: Muscle weakness (generalized) (M62.81);Pain Pain - Right/Left: Left Pain - part of body: Leg;Knee;Hip    Time: 5329-9242 PT Time Calculation (min) (ACUTE ONLY): 24 min   Charges:   PT Evaluation $PT Eval Moderate Complexity: 1 Mod PT Treatments $Therapeutic Activity: 8-22 mins       Ramond Dial 04/06/2020, 4:58 PM  Mee Hives, PT MS Acute Rehab Dept. Number: [place] and [place]

## 2020-04-06 NOTE — Progress Notes (Signed)
Mobility Specialist: Progress Note   04/06/20 1455  Mobility  Activity Transferred:  Chair to bed  Level of Assistance +2 (takes two people)  Assistive Device None  Mobility Response Tolerated well  Mobility performed by Mobility specialist  Bed Position Semi-fowlers  $Mobility charge 1 Mobility   Assisted pt in transferring back to bed w/ help from RN.   [facility] Kristle Wesch Mobility Specialist

## 2020-04-06 NOTE — Plan of Care (Signed)
  Problem: Health Behavior/Discharge Planning: Goal: Ability to manage health-related needs will improve Outcome: Progressing   

## 2020-04-07 ENCOUNTER — Other Ambulatory Visit: Payer: Self-pay | Admitting: *Deleted

## 2020-04-07 LAB — BASIC METABOLIC PANEL
Anion gap: 13 (ref 5–15)
BUN: 43 mg/dL — ABNORMAL HIGH (ref 8–23)
CO2: 27 mmol/L (ref 22–32)
Calcium: 9.3 mg/dL (ref 8.9–10.3)
Chloride: 96 mmol/L — ABNORMAL LOW (ref 98–111)
Creatinine, Ser: 8.95 mg/dL — ABNORMAL HIGH (ref 0.61–1.24)
GFR calc Af Amer: 6 mL/min — ABNORMAL LOW (ref >60)
GFR calc non Af Amer: 5 mL/min — ABNORMAL LOW (ref >60)
Glucose, Bld: 175 mg/dL — ABNORMAL HIGH (ref 70–99)
Potassium: 4.5 mmol/L (ref 3.5–5.1)
Sodium: 136 mmol/L (ref 135–145)

## 2020-04-07 LAB — CBC
HCT: 23.2 % — ABNORMAL LOW (ref 39.0–52.0)
Hemoglobin: 7.9 g/dL — ABNORMAL LOW (ref 13.0–17.0)
MCH: 35.9 pg — ABNORMAL HIGH (ref 26.0–34.0)
MCHC: 34.1 g/dL (ref 30.0–36.0)
MCV: 105.5 fL — ABNORMAL HIGH (ref 80.0–100.0)
Platelets: 148 10*3/uL — ABNORMAL LOW (ref 150–400)
RBC: 2.2 MIL/uL — ABNORMAL LOW (ref 4.22–5.81)
RDW: 15.2 % (ref 11.5–15.5)
WBC: 5.6 10*3/uL (ref 4.0–10.5)
nRBC: 0 % (ref 0.0–0.2)

## 2020-04-07 LAB — GLUCOSE, CAPILLARY
Glucose-Capillary: 176 mg/dL — ABNORMAL HIGH (ref 70–99)
Glucose-Capillary: 164 mg/dL — ABNORMAL HIGH (ref 70–99)

## 2020-04-07 MED ORDER — OXYCODONE-ACETAMINOPHEN 5-325 MG PO TABS: 1.0000 | ORAL_TABLET | Freq: Four times a day (QID) | ORAL | 0 refills | Status: DC | PRN

## 2020-04-07 NOTE — Progress Notes (Signed)
Patient refusing HD for today.

## 2020-04-07 NOTE — Progress Notes (Signed)
Patient refused HD in the inpt unit today and Renal Navigator notes plan for discharge. Navigator contacted patient's OP HD clinic/[place]-manager states they cannot accommodate patient later today, but tomorrow is a possibility if he calls this afternoon.  Navigator met with patient who states he does not want HD here today because he just wants to go home. He states he takes his HD and fluid intake very seriously and that he will call this afternoon if he feels he needs a treatment tomorrow and will ensure that he goes to his normal seat time on Saturday. He states that he rarely misses HD.  Navigator notified Nephrologist and Renal PA of above.  Alphonzo Cruise, [place] Renal Navigator [phone]

## 2020-04-07 NOTE — Progress Notes (Signed)
Occupational Therapy Treatment Patient Details Name: [name] MRN: [id number] DOB: Aug 23, 1950 Today's Date: 04/07/2020    History of present illness 69 yo admitted for left fem-pop BPG. 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 He tolerating EOB activity and OOB to recliner. Pt continues to require increased assist for squat pivot transfers (modA for transfer over recliner armrest), but suspect he may do much better with lateral transfers. Discussed this with pt as he is very adamant to return home and is currently declining post acute rehab. Educated in compensatory methods for performing ADL/functional transfers at home while maximizing his safety with pt verbalizing understanding. Pt also with questions/concerns about performing posterior pericare, educated in AE for performing ADL task to increase safety/independence as well as resources to obtain. Given pt quite adamant not to return home have updated d/c recommendations. Recommend he receive max HH services initially to ensure/maximize his safety and independence with ADL and mobility. Will continue to follow acutely.   Follow Up Recommendations  Home health OT;Supervision/Assistance - 24 hour    Equipment Recommendations  None recommended by OT          Precautions / Restrictions Precautions Precautions: Fall Precaution Comments: old R AKA Restrictions Weight Bearing Restrictions: No       Mobility Bed Mobility Overal bed mobility: Needs Assistance Bed Mobility: Supine to Sit     Supine to sit: Min assist     General bed mobility comments: assist to scoot hips towards EOB via bed pad, HOB elevated   Transfers Overall transfer level: Needs assistance   Transfers: Squat Pivot Transfers     Squat pivot transfers: Mod assist;+2 safety/equipment     General transfer comment: bed to recliner, requiring some boosting assist to  navigate over armrest of recliner, pt demosntrates safe UE placement to best self-assist with transitions     Balance Overall balance assessment: Needs assistance Sitting-balance support: No upper extremity supported Sitting balance-Leahy Scale: Good                                     ADL either performed or assessed with clinical judgement   ADL Overall ADL's : Needs assistance/impaired Eating/Feeding: Modified independent                   Lower Body Dressing: Moderate assistance;Sitting/lateral leans;Sit to/from stand Lower Body Dressing Details (indicate cue type and reason): pt requiring assist to don L sock - with therapist starting over his foot he's able to finish donning; discussed techniques for LB dressing at home as pt reports he typically performs from bed level    Toilet Transfer Details (indicate cue type and reason): dicussed likely need to bring 3:1 into main part of his home (as his w/c doesn't fit into his bathroom and his transport chair which he uses to get into bathroom doesn't have a drop arm). educated on likely need to perform lateral transfers initially to ensure safety with mobility tasks. pt reports has a drop arm 3:1, verbalizes understanding       Tub/Shower Transfer Details (indicate cue type and reason): discussed likely need to sponge bathe initially to ensure safety given increased difficulty with transfers  Functional mobility during ADLs: Moderate assistance;+2 for safety/equipment (squat pivot transfers)  Cognition Arousal/Alertness: Awake/alert Behavior During Therapy: WFL for tasks assessed/performed Overall Cognitive Status: Within Functional Limits for tasks assessed                                          Exercises     Shoulder Instructions       General Comments      Pertinent Vitals/ Pain       Pain Assessment: Faces Faces Pain Scale: Hurts a little bit Pain  Location: groin/incision sites Pain Descriptors / Indicators: Operative site guarding;Grimacing;Guarding Pain Intervention(s): Monitored during session;Repositioned  Home Living                                          Prior Functioning/Environment              Frequency  Min 2X/week        Progress Toward Goals  OT Goals(current goals can now be found in the care plan section)  Progress towards OT goals: Progressing toward goals  Acute Rehab OT Goals Patient Stated Goal: get home right away OT Goal Formulation: With patient Time For Goal Achievement: 04/20/20 Potential to Achieve Goals: Good ADL Goals Pt Will Perform Grooming: sitting;with modified independence Pt Will Perform Upper Body Dressing: with modified independence;sitting Pt Will Perform Lower Body Dressing: with min guard assist;sitting/lateral leans Pt Will Transfer to Toilet: with min guard assist;bedside commode Pt Will Perform Toileting - Clothing Manipulation and hygiene: with set-up;sitting/lateral leans  Plan Discharge plan needs to be updated    Co-evaluation                 AM-PAC OT "6 Clicks" Daily Activity     Outcome Measure   Help from another person eating meals?: None Help from another person taking care of personal grooming?: A Little Help from another person toileting, which includes using toliet, bedpan, or urinal?: A Lot Help from another person bathing (including washing, rinsing, drying)?: A Lot Help from another person to put on and taking off regular upper body clothing?: A Little Help from another person to put on and taking off regular lower body clothing?: A Lot 6 Click Score: 16    End of Session Equipment Utilized During Treatment: Gait belt  OT Visit Diagnosis: Other abnormalities of gait and mobility (R26.89);Muscle weakness (generalized) (M62.81);Pain Pain - Right/Left: Left Pain - part of body: Leg   Activity Tolerance Patient tolerated  treatment well   Patient Left in chair;with call bell/phone within reach   Nurse Communication Mobility status        Time: 0355-9741 OT Time Calculation (min): 33 min  Charges: OT General Charges $OT Visit: 1 Visit OT Treatments $Self Care/Home Management : 23-37 mins  Lou Cal, OT Acute Rehabilitation Services Pager [phone] Office [place] 04/07/2020, 11:11 AM

## 2020-04-07 NOTE — Discharge Instructions (Signed)
 [facility] [place]  Discharge instructions  Lower Extremity Bypass Surgery  Please refer to the following instruction for your post-procedure care. Your surgeon or physician assistant will discuss any changes with you.  Activity  You are encouraged to walk as much as you can. You can slowly return to normal activities during the month after your surgery. Avoid strenuous activity and heavy lifting until your doctor tells you it's OK. Avoid activities such as vacuuming or swinging a golf club. Do not drive until your doctor give the OK and you are no longer taking prescription pain medications. It is also normal to have difficulty with sleep habits, eating and bowel movement after surgery. These will go away with time.  Bathing/Showering  Shower daily after you go home. Do not soak in a bathtub, hot tub, or swim until the incision heals completely.  Incision Care  Clean your incision with mild soap and water. Shower every day. Pat the area dry with a clean towel. You do not need a bandage unless otherwise instructed. Do not apply any ointments or creams to your incision. If you have open wounds you will be instructed how to care for them or a visiting nurse may be arranged for you. If you have staples or sutures along your incision they will be removed at your post-op appointment. You may have skin glue on your incision. Do not peel it off. It will come off on its own in about one week.  Wash the groin wound with soap and water daily and pat dry. (No tub bath-only shower)  Then put a dry gauze or washcloth in the groin to keep this area dry to help prevent wound infection.  Do this daily and as needed.  Do not use Vaseline or neosporin on your incisions.  Only use soap and water on your incisions and then protect and keep dry.  Diet  Resume your normal diet. There are no special food restrictions following this procedure. A low fat/ low cholesterol diet is  recommended for all patients with vascular disease. In order to heal from your surgery, it is CRITICAL to get adequate nutrition. Your body requires vitamins, minerals, and protein. Vegetables are the best source of vitamins and minerals. Vegetables also provide the perfect balance of protein. Processed food has little nutritional value, so try to avoid this.  Medications  Resume taking all your medications unless your doctor or physician assistant tells you not to. If your incision is causing pain, you may take over-the-counter pain relievers such as acetaminophen (Tylenol). If you were prescribed a stronger pain medication, please aware these medication can cause nausea and constipation. Prevent nausea by taking the medication with a snack or meal. Avoid constipation by drinking plenty of fluids and eating foods with high amount of fiber, such as fruits, vegetables, and grains. Take Colace 100 mg (an over-the-counter stool softener) twice a day as needed for constipation.  Do not take Tylenol if you are taking prescription pain medications.  Follow Up  Our office will schedule a follow up appointment 2-3 weeks following discharge.  Please call us immediately for any of the following conditions  Severe or worsening pain in your legs or feet while at rest or while walking Increase pain, redness, warmth, or drainage (pus) from your incision site(s) Fever of 101 degree or higher The swelling in your leg with the bypass suddenly worsens and becomes more painful than when you were in the hospital If you have   been instructed to feel your graft pulse then you should do so every day. If you can no longer feel this pulse, call the office immediately. Not all patients are given this instruction.  Leg swelling is common after leg bypass surgery.  The swelling should improve over a few months following surgery. To improve the swelling, you may elevate your legs above the level of your heart while you are  sitting or resting. Your surgeon or physician assistant may ask you to apply an ACE wrap or wear compression (TED) stockings to help to reduce swelling.  Reduce your risk of vascular disease  Stop smoking. If you would like help call [facility] at 1-800-QUIT-NOW (1-800-784-8669) or [place] at 336-586-4000.  Manage your cholesterol Maintain a desired weight Control your diabetes weight Control your diabetes Keep your blood pressure down  If you have any questions, please call the office at [phone]  

## 2020-04-07 NOTE — Progress Notes (Addendum)
  Progress Note    04/07/2020 7:43 AM 3 Days Post-Op  Subjective:  Wants to go home; says he transferred to the chair.  Says PT is going to work with him this am.   Afebrile HR 60's-80's NSR 737'T-062'I systolic 94% RA  Vitals:   04/06/20 2338 04/07/20 0328  BP: (!) 119/56 (!) 163/63  Pulse: 66 68  Resp: 18 19  Temp: 97.8 F (36.6 C) 97.9 F (36.6 C)  SpO2: 95% 94%    Physical Exam: Cardiac:  regular Lungs:  Non labored Incisions:  All incisions look good Extremities:  Brisk left DP/PT doppler signals   CBC    Component Value Date/Time   WBC 5.6 04/07/2020 0248   RBC 2.20 (L) 04/07/2020 0248   HGB 7.9 (L) 04/07/2020 0248   HGB 11.5 (L) 02/08/2017 1409   HCT 23.2 (L) 04/07/2020 0248   HCT 34.3 (L) 02/08/2017 1409   PLT 148 (L) 04/07/2020 0248   PLT 208 02/08/2017 1409   MCV 105.5 (H) 04/07/2020 0248   MCV 94 02/08/2017 1409   MCH 35.9 (H) 04/07/2020 0248   MCHC 34.1 04/07/2020 0248   RDW 15.2 04/07/2020 0248   RDW 16.7 (H) 02/08/2017 1409   LYMPHSABS 1.5 12/11/2018 1425   LYMPHSABS 2.1 02/08/2017 1409   MONOABS 0.7 12/11/2018 1425   EOSABS 0.3 12/11/2018 1425   EOSABS 0.2 02/08/2017 1409   BASOSABS 0.0 12/11/2018 1425   BASOSABS 0.0 02/08/2017 1409    BMET    Component Value Date/Time   NA 136 04/07/2020 0248   NA 134 02/08/2017 1403   K 4.5 04/07/2020 0248   CL 96 (L) 04/07/2020 0248   CO2 27 04/07/2020 0248   GLUCOSE 175 (H) 04/07/2020 0248   BUN 43 (H) 04/07/2020 0248   BUN 67 (H) 02/08/2017 1403   CREATININE 8.95 (H) 04/07/2020 0248   CALCIUM 9.3 04/07/2020 0248   GFRNONAA 5 (L) 04/07/2020 0248   GFRAA 6 (L) 04/07/2020 0248    INR    Component Value Date/Time   INR 1.2 04/01/2020 1438     Intake/Output Summary (Last 24 hours) at 04/07/2020 0743 Last data filed at 04/06/2020 2045 Gross per 24 hour  Intake 480 ml  Output --  Net 480 ml     Assessment:  69 y.o. male is s/p:  Left common femoral endarterectomy, left femoral to  below-knee popliteal bypass with nonreversed left greater saphenous vein  3 Days Post-Op  Plan: -pt with patent bypass with brisk left DP/PT doppler signals -discussed groin wound care with pt. -PT recommended CIR yesterday, however, pt wants to go home.  Dr. [name] to see pt this am and make decision.   -DVT prophylaxis:  Sq heparin -PDMP reviewed.     Leontine Locket, PA-C [facility] [phone] 04/07/2020 7:43 AM  Agree with above. Will d/c home with home health Follow up 2 weeks  [name], MD [facility] [place] Office: [phone]

## 2020-04-07 NOTE — Progress Notes (Signed)
Physical Therapy Treatment Patient Details Name: [name] MRN: [id number] DOB: 1950/10/17 Today's Date: 04/07/2020    History of Present Illness 69 yo admitted for left fem-pop BPG. 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 Pt has all needed equipment at home and is ready for dc from PT standpoint.    Follow Up Recommendations  Home health PT;Supervision for mobility/OOB     Equipment Recommendations  None recommended by PT    Recommendations for Other Services       Precautions / Restrictions Precautions Precautions: Fall Precaution Comments: old R AKA Restrictions Weight Bearing Restrictions: No    Mobility  Bed Mobility Overal bed mobility: Needs Assistance Bed Mobility: Supine to Sit     Supine to sit: Min assist     General bed mobility comments: pt up in recliner  Transfers Overall transfer level: Needs assistance   Transfers: Lateral/Scoot Transfers     Squat pivot transfers: Mod assist;+2 safety/equipment     General transfer comment: Side to side lateral scoot in bariatric recliner  Ambulation/Gait                 Stairs             Wheelchair Mobility    Modified Rankin (Stroke Patients Only)       Balance Overall balance assessment: Needs assistance Sitting-balance support: No upper extremity supported Sitting balance-Leahy Scale: Good                                      Cognition Arousal/Alertness: Awake/alert Behavior During Therapy: WFL for tasks assessed/performed Overall Cognitive Status: Within Functional Limits for tasks assessed                                        Exercises General Exercises - Lower Extremity Long Arc Quad: AROM;Left;10 reps;Seated Other Exercises Other Exercises: Chair push up x 10    General Comments         Pertinent Vitals/Pain Pain Assessment: Faces Faces Pain Scale: Hurts a little bit Pain Location: groin/incision sites Pain Descriptors / Indicators: Operative site guarding;Grimacing;Guarding Pain Intervention(s): Monitored during session    Home Living                      Prior Function            PT Goals (current goals can now be found in the care plan section) Acute Rehab PT Goals Patient Stated Goal: get home right away Progress towards PT goals: Progressing toward goals    Frequency    Min 3X/week      PT Plan Discharge plan needs to be updated;Frequency needs to be updated    Co-evaluation              AM-PAC PT "6 Clicks" Mobility   Outcome Measure  Help needed turning from your back to your side while in a flat bed without using bedrails?: None Help needed moving from lying on your back to sitting on the side of a flat bed without using bedrails?: A Little Help needed moving to and from a  bed to a chair (including a wheelchair)?: A Little Help needed standing up from a chair using your arms (e.g., wheelchair or bedside chair)?: A Lot Help needed to walk in hospital room?: Total Help needed climbing 3-5 steps with a railing? : Total 6 Click Score: 14    End of Session   Activity Tolerance: Patient tolerated treatment well Patient left: in chair;with call bell/phone within reach   PT Visit Diagnosis: Other abnormalities of gait and mobility (R26.89);Pain Pain - Right/Left: Left Pain - part of body: Hip;Leg     Time: 1046-1106 PT Time Calculation (min) (ACUTE ONLY): 20 min  Charges:  $Therapeutic Activity: 8-22 mins                     [place] Pager [phone] Office [place] 04/07/2020, 1:05 PM

## 2020-04-07 NOTE — Discharge Summary (Signed)
Discharge Summary     [name] [birth year] 69 y.o. male  [id number]  Admission Date: 04/04/2020  Discharge Date: 04/07/2020  Physician: [name], MD  Admission Diagnosis: PAD (peripheral artery disease) ([place]) [I73.9]  HPI:   This is a 69 y.o. male who returns for follow-up today regarding peripheral arterial disease and nonhealing wounds on his left foot. His last arteriogram was in July 2018. This was done by Dr. Gwenlyn Found. He had atherectomy of his left superficial femoral artery and popliteal artery. This was complicated by right groin hematoma which required evacuation. 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 He still has problems with muscle spasms in the right leg. His amputation was in September 2020. He takes Lyrica which she thinks may help a little bit. He currently has several wounds on his left foot which are being taken care of by home health nurse. It last measured 2 x 3 cm 1 mm depth when I saw him in March 2021.  He is on Plavix and a statin.  Other chronic medical problems include COPD diabetes hypertension neuropathy all of which are currently stable.   Review of systems: He has no shortness of breath.  He has no chest pain.  He is on hemodialysis and dialyzes Tuesday Thursday Saturday  Hospital Course:  The patient was admitted to the hospital and taken to the operating room on 04/04/2020 and underwent: Left common femoral endarterectomy, left femoral to below-knee popliteal bypass with nonreversed left greater saphenous vein    Findings: 1.  Severe calcific atherosclerosis  2.  Long proximal anastomosis to incorporate 3 cm segment of left common femoral endarterectomy  3.  Posterior tibial Doppler signal at conclusion of case  4.  Good quality saphenous vein 4 mm diameter  5.  Graft is tunneled subsartorial but the first 7 cm of this is in the subcutaneous position and fairly superficial to the first saphenectomy incision  below the groin incision  The pt tolerated the procedure well and was transported to the PACU in good condition.   By POD 1, his left groin was causing discomfort but no other complaints.  POD 2, left leg well perfused with brisk doppler signals left DP/PT.  Pt advised to keep dry gauze in left groin to wick moisture.  POD 3, pt doing well and able to transfer from bed to chair.  He continued to have brisk doppler signals left foot.  TOC consulted for HHPT and resume previous [place] orders.   The remainder of the hospital course consisted of increasing mobilization and increasing intake of solids without difficulty.  CBC    Component Value Date/Time   WBC 5.6 04/07/2020 0248   RBC 2.20 (L) 04/07/2020 0248   HGB 7.9 (L) 04/07/2020 0248   HGB 11.5 (L) 02/08/2017 1409   HCT 23.2 (L) 04/07/2020 0248   HCT 34.3 (L) 02/08/2017 1409   PLT 148 (L) 04/07/2020 0248   PLT 208 02/08/2017 1409   MCV 105.5 (H) 04/07/2020 0248   MCV 94 02/08/2017 1409   MCH 35.9 (H) 04/07/2020 0248   MCHC 34.1 04/07/2020 0248   RDW 15.2 04/07/2020 0248   RDW 16.7 (H) 02/08/2017 1409   LYMPHSABS 1.5 12/11/2018 1425   LYMPHSABS 2.1 02/08/2017 1409   MONOABS 0.7 12/11/2018 1425   EOSABS 0.3 12/11/2018 1425   EOSABS 0.2 02/08/2017 1409   BASOSABS 0.0 12/11/2018 1425   BASOSABS 0.0 02/08/2017 1409    BMET    Component Value Date/Time  NA 136 04/07/2020 0248   NA 134 02/08/2017 1403   K 4.5 04/07/2020 0248   CL 96 (L) 04/07/2020 0248   CO2 27 04/07/2020 0248   GLUCOSE 175 (H) 04/07/2020 0248   BUN 43 (H) 04/07/2020 0248   BUN 67 (H) 02/08/2017 1403   CREATININE 8.95 (H) 04/07/2020 0248   CALCIUM 9.3 04/07/2020 0248   GFRNONAA 5 (L) 04/07/2020 0248   GFRAA 6 (L) 04/07/2020 0248       Discharge Diagnosis:  PAD (peripheral artery disease) ([place]) [I73.9]  Secondary Diagnosis: Patient Active Problem List   Diagnosis Date Noted  . CAD (coronary artery disease)   . Post-operative pain   . Labile blood  pressure   . Labile blood glucose   . Hx of anxiety disorder   . Hypoglycemia   . Diabetic peripheral neuropathy ([place])   . Neuropathic pain   . S/P unilateral BKA (below knee amputation), right ([place])   . Sleep disturbance   . Type 2 diabetes mellitus with peripheral neuropathy (HCC)   . Benign essential HTN   . Acute blood loss anemia   . Anemia of chronic disease   . Right below-knee amputee ([place]) 12/03/2018  . Necrotizing fasciitis of ankle and foot ([place])   . Unilateral complete BKA, right, initial encounter ([place])   . Postoperative pain   . Phantom limb pain ([place])   . Drug induced constipation   . Poorly controlled type 2 diabetes mellitus with peripheral neuropathy ([place])   . Acute on chronic anemia   . Ischemia of extremity 12/01/2018  . Metabolic disorder 60/73/7106  . 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 PAD (peripheral artery disease) ([place]) 02/12/2017  . Critical lower limb ischemia 02/11/2017  . Dependence on renal dialysis ([place]) 01/14/2017  . Patient's noncompliance with other medical treatment and regimen 01/14/2017  . Anemia in chronic kidney disease 01/14/2017  . Type 2 diabetes mellitus with complication ([place]) 26/94/8546  . Type 2 diabetes mellitus with diabetic neuropathy ([place]) 01/14/2017  . Peripheral neuropathy 02/16/2016  . Acute respiratory failure with hypoxia ([place]) 08/28/2015  . Hypertensive urgency 08/28/2015  . Chronic anemia 08/28/2015  . ESRD on dialysis ([place]) 08/28/2015  . Diabetes mellitus with end stage renal disease ([place]) 08/28/2015  . PVD (peripheral vascular disease) ([place]) 07/15/2014  . Peripheral vascular disease, unspecified ([place]) 01/14/2014  . Pain in limb-left arm 04/09/2013  . Guaiac + stool 04/07/2013  . Anemia 04/07/2013  . Dysphagia, unspecified(787.20) 04/07/2013  . Numbness and tingling-left arm  02/04/2013  . Cold hands and feet-left arm 02/04/2013  . Other complications due to renal dialysis  device, implant, and graft 06/06/2012  . End stage renal disease ([place]) 02/05/2012  . Injury to blood vessels, unspecified site 02/05/2012  . Essential hypertension 11/27/2011  . Pain in joint, shoulder region 09/14/2011   Past Medical History:  Diagnosis Date  . Anemia   . Anxiety   . Arthritis   . CAD (coronary artery disease)    Myoview 8/21: EF 39, Lg inf defect c/w prior infarct (less likely artifact from RBBB), no ischemia; Intermediate Risk   . Chronic kidney disease    Dialysis T/Th/Sa  . COPD (chronic obstructive pulmonary disease) ([place])    Pt denies  . Depression   . Diabetes mellitus without complication ([place])   . GERD (gastroesophageal reflux disease)   . H/O hiatal hernia   . Headache(784.0)    Hx: Migraines  . History of kidney stones   .  Hypertension   . Hypothyroidism   . Neuropathy   . Non-healing wound of amputation stump ([place])    right  . Numbness of toes    toes and feet  . Pneumonia    Hx: of several times - pt denies (04/24/19)  . Renal insufficiency   . Shortness of breath dyspnea   . Type 2 diabetes mellitus (HCC)      Allergies as of 04/07/2020      Reactions   Ace Inhibitors    Unknown reaction, tolerates lisinopril    Clonidine    Unknown reaction    Morphine And Related Other (See Comments)   Not effective   Oxytocin    unknown      Medication List    TAKE these medications   albuterol 108 (90 Base) MCG/ACT inhaler Commonly known as: VENTOLIN HFA Inhale 2 puffs into the lungs every 6 (six) hours as needed for wheezing or shortness of breath.   ARTIFICIAL TEAR OP Place 1 drop into both eyes every 6 (six) hours as needed (dry eyes).   atorvastatin 80 MG tablet Commonly known as: LIPITOR Take 1 tablet (80 mg total) by mouth at bedtime. What changed: when to take this   bismuth subsalicylate 426 ST/41DQ suspension Commonly known as: PEPTO BISMOL Take 30 mLs by mouth every 6 (six) hours as needed for indigestion.   calcium  acetate 667 MG capsule Commonly known as: PHOSLO Take 4 capsules (2,668 mg total) by mouth 3 (three) times daily with meals. What changed:   how much to take  when to take this  additional instructions   cinacalcet 30 MG tablet Commonly known as: SENSIPAR Take 1 tablet (30 mg total) by mouth every evening.   clopidogrel 75 MG tablet Commonly known as: PLAVIX Take 1 tablet (75 mg total) by mouth daily.   diphenhydrAMINE 25 MG tablet Commonly known as: BENADRYL Take 25 mg by mouth daily as needed for itching.   DULoxetine 30 MG capsule Commonly known as: CYMBALTA Take 30 mg by mouth daily.   ibuprofen 200 MG tablet Commonly known as: ADVIL Take 400 mg by mouth 3 (three) times daily.   levothyroxine 125 MCG tablet Commonly known as: SYNTHROID Take 1 tablet (125 mcg total) by mouth daily before breakfast. What changed: when to take this   lisinopril 10 MG tablet Commonly known as: ZESTRIL Take 10 mg by mouth 4 (four) times a week. Take 10 mg daily on non dialysis days (Sun, Mon, Wed, and Fri)   Melatonin 10 MG Caps Take 10 mg by mouth at bedtime.   midodrine 10 MG tablet Commonly known as: PROAMATINE Take 10 mg by mouth Every Tuesday,Thursday,and Saturday with dialysis.   multivitamin Tabs tablet Take 1 tablet by mouth daily.   oxyCODONE-acetaminophen 5-325 MG tablet Commonly known as: Percocet Take 1 tablet by mouth every 6 (six) hours as needed for severe pain.   polyethylene glycol 17 g packet Commonly known as: MIRALAX / GLYCOLAX Take 17 g by mouth daily as needed for moderate constipation.   pregabalin 150 MG capsule Commonly known as: LYRICA Take 1 capsule (150 mg total) by mouth 3 (three) times daily.   sevelamer carbonate 800 MG tablet Commonly known as: RENVELA Take 1,600-3,200 mg by mouth See admin instructions. Take 3200 mg with each meal and 1600 mg with each snack   Toujeo SoloStar 300 UNIT/ML Solostar Pen Generic drug: insulin glargine (1  Unit Dial) Inject 54-78 Units as directed at bedtime as  needed (if blood sugar is 130 or higher).       Discharge Instructions: [facility] Discharge instructions Lower Extremity Bypass Surgery  Please refer to the following instruction for your post-procedure care. Your surgeon or physician assistant will discuss any changes with you.  Activity  You are encouraged to walk as much as you can. You can slowly return to normal activities during the month after your surgery. Avoid strenuous activity and heavy lifting until your doctor tells you it's OK. Avoid activities such as vacuuming or swinging a golf club. Do not drive until your doctor give the OK and you are no longer taking prescription pain medications. It is also normal to have difficulty with sleep habits, eating and bowel movement after surgery. These will go away with time.  Bathing/Showering  You may shower after you go home. Do not soak in a bathtub, hot tub, or swim until the incision heals completely.  Incision Care  Clean your incision with mild soap and water. Shower every day. Pat the area dry with a clean towel. You do not need a bandage unless otherwise instructed. Do not apply any ointments or creams to your incision. If you have open wounds you will be instructed how to care for them or a visiting nurse may be arranged for you. If you have staples or sutures along your incision they will be removed at your post-op appointment. You may have skin glue on your incision. Do not peel it off. It will come off on its own in about one week.  Wash the groin wound with soap and water daily and pat dry. (No tub bath-only shower)  Then put a dry gauze or washcloth in the groin to keep this area dry to help prevent wound infection.  Do this daily and as needed.  Do not use Vaseline or neosporin on your incisions.  Only use soap and water on your incisions and then protect and keep dry.  Diet  Resume  your normal diet. There are no special food restrictions following this procedure. A low fat/ low cholesterol diet is recommended for all patients with vascular disease. In order to heal from your surgery, it is CRITICAL to get adequate nutrition. Your body requires vitamins, minerals, and protein. Vegetables are the best source of vitamins and minerals. Vegetables also provide the perfect balance of protein. Processed food has little nutritional value, so try to avoid this.  Medications  Resume taking all your medications unless your doctor or Physician Assistant tells you not to. If your incision is causing pain, you may take over-the-counter pain relievers such as acetaminophen (Tylenol). If you were prescribed a stronger pain medication, please aware these medication can cause nausea and constipation. Prevent nausea by taking the medication with a snack or meal. Avoid constipation by drinking plenty of fluids and eating foods with high amount of fiber, such as fruits, vegetables, and grains. Take Colace 100 mg (an over-the-counter stool softener) twice a day as needed for constipation.  Do not take Tylenol if you are taking prescription pain medications.  Follow Up  Our office will schedule a follow up appointment 2-3 weeks following discharge.  Please call us immediately for any of the following conditions  .Severe or worsening pain in your legs or feet while at rest or while walking .Increase pain, redness, warmth, or drainage (pus) from your incision site(s) . Fever of 101 degree or higher . The swelling in your leg with the bypass  suddenly worsens and becomes more painful than when you were in the hospital . If you have been instructed to feel your graft pulse then you should do so every day. If you can no longer feel this pulse, call the office immediately. Not all patients are given this instruction. .  Leg swelling is common after leg bypass surgery.  The swelling should improve over a  few months following surgery. To improve the swelling, you may elevate your legs above the level of your heart while you are sitting or resting. Your surgeon or physician assistant may ask you to apply an ACE wrap or wear compression (TED) stockings to help to reduce swelling.  Reduce your risk of vascular disease  Stop smoking. If you would like help call [facility] at 1-800-QUIT-NOW 832-102-3869) or [employer] at 8077323286.  . Manage your cholesterol . Maintain a desired weight . Control your diabetes weight . Control your diabetes . Keep your blood pressure down .  If you have any questions, please call the office at [phone]   Prescriptions given: 1.  Roxicet #30 No Refill  Disposition: home with [place]  Patient's condition: is Good  Follow up: 1. Dr. [name] in 2 weeks   Leontine Locket, PA-C [facility] [phone] 04/07/2020  11:21 AM  - For VQI Registry use ---   Post-op:  Wound infection: No  Graft infection: No  Transfusion: No    If yes, n/a units given New Arrhythmia: No Ipsilateral amputation: No, [ ]  Minor, [ ]  BKA, [ ]  AKA Discharge patency: [x ] Primary, [ ]  Primary assisted, [ ]  Secondary, [ ]  Occluded Patency judged by: [x ] Dopper only, [ ]  Palpable graft pulse, []  Palpable distal pulse, [ ]  ABI inc. > 0.15, [ ]  Duplex Discharge ABI: , L not done D/C Ambulatory Status: Wheelchair  Complications: MI: No, [ ]  Troponin only, [ ]  EKG or Clinical CHF: No Resp failure:No, [ ]  Pneumonia, [ ]  Ventilator Chg in renal function: No, [ ]  Inc. Cr > 0.5, [ ]  Temp. Dialysis,  [ ]  Permanent dialysis Stroke: No, [ ]  Minor, [ ]  Major Return to OR: No  Reason for return to OR: [ ]  Bleeding, [ ]  Infection, [ ]  Thrombosis, [ ]  Revision  Discharge medications: Statin use:  yes ASA use:  no Plavix use:  yes Beta blocker use: no CCB use:  No ACEI use:   yes ARB use:  no Coumadin use: no

## 2020-04-07 NOTE — Patient Outreach (Signed)
[place] [facility]) Care Management  04/07/2020  [name] 1951-03-07 [id number]   [place] Discipline Closure  Referral Date: 06/26/2019 Referral Source: Transfer from [place] Reason for Referral: Continued Disease Management Education Insurance: Medicare   Outreach Attempt: Notified of patients hospital admission for peripheral by pass surgery on 04/04/2020.  Discharged today.  Notified by [facility] Liaison patient to be followed by [place] Coordinator for Transitions of Care.  Plan:  RN Health Coach will close Disease Management case at this time.  RN Health Coach will send primary care provider Discipline Closure letter.   [name] [phone] [name]

## 2020-04-07 NOTE — Consult Note (Signed)
   [facility] CM Inpatient Consult   04/07/2020  [name] 12-31-1950 [id number]   [facility] Follow up:  Went to bedside, HIPAA verified, and spoke with patient explaining [place] Management services [facility] CM). Explained to [name] that unsuccessful attempts had been made by [place] to engage him and assist with chronic disease management.   [name] provided a home telephone number for contact, [phone]. States that he has dialysis on T,TH,Sa and requests phone calls on days when he does not have dialysis. Patient denies need for transportation or meals. States that he has had difficulty receiving one of his medications. States that outpatient pharmacy used is [facility], [place], [place].   Verbally consents to have continued [facility] CM follow up. Referral will be placed.  Of note, [facility] Management services does not replace or interfere with any services that are arranged by inpatient case management or social work.  Netta Cedars, MSN, [facility] Liaison Nurse Mobile Phone [phone]  Toll free office [phone]

## 2020-04-07 NOTE — Progress Notes (Signed)
[facility] Progress Note  Subjective: seen in room, alert, no c/o, refusing HD today, going home says he will go to next HD on Sat  Vitals:   04/06/20 2338 04/07/20 0328 04/07/20 0807 04/07/20 1129  BP: (!) 119/56 (!) 163/63 (!) 142/57 (!) 127/52  Pulse: 66 68 77 68  Resp: 18 19 15 18   Temp: 97.8 F (36.6 C) 97.9 F (36.6 C) 98.6 F (37 C) 98.1 F (36.7 C)  TempSrc: Oral Oral Oral Oral  SpO2: 95% 94% 97% 100%  Weight:  116.9 kg    Height:        Exam: Gen alert, and no distress No jvd or bruits Chest clear bilat to bases no rales RRR no MRG Abd soft ntnd no mass or ascites +bs Ext R BKA,  post op wounds from ankle to L groin, no edema Neuro nonfocal LUE AVF+bruit    Home meds:  - phoslo 2-3 ac tid/ sensipar 30 hs/ synthroid 125/ renvela 2-4ac  - toujeo 54- 78u qhs sq prn  - lyrica 150 tid/ cymbalta 30 qd  - lipitor 80/ plavix 75/ lisinopril 10mg  non HD days/ midodrine 10 tts    OP HD: [facility]    4.5h   111.5 Kg   Heparin 2000 + 1000u/hr    LUE AVF      Assessment/ Plan: 1. LLE ischemia - sp surgical revasc / fem-pop bypass graft 8/23 2. ESRD - TTS on HD in [place], [place]. Last HD here on 8/24. Pt refusing HD today, told him to contact his OP HD unit for HD tomorrow in place of today.  No urgent need for RRT today, vol / labs stable.  3. BP/ volume - volume okay on exam, taking acei and midodrine at home 4. IDDM - per primary team 5. PAD sp R BKA 6. Anemia ckd - Hb 7.9 post op  And 7.9 today, stable     [name] 04/07/2020, 11:38 AM   Recent Labs  Lab 04/05/20 0428 04/05/20 0428 04/06/20 0322 04/07/20 0248  K 5.3*   < > 4.5 4.5  BUN 60*   < > 29* 43*  CREATININE 10.15*   < > 6.58* 8.95*  CALCIUM 9.5   < > 8.8* 9.3  HGB 7.9*  --   --  7.9*   < > = values in this interval not displayed.   Inpatient medications: . atorvastatin  80 mg Oral Daily  . calcium acetate  1,334 mg Oral With snacks  . calcium acetate  2,001 mg  Oral TID with meals  . cinacalcet  30 mg Oral QPM  . clopidogrel  75 mg Oral Daily  . DULoxetine  30 mg Oral Daily  . heparin  5,000 Units Subcutaneous Q8H  . insulin aspart  0-15 Units Subcutaneous TID WC  . levothyroxine  125 mcg Oral QAC breakfast  . lisinopril  10 mg Oral Once per day on Sun Tue Thu Sat  . melatonin  10 mg Oral QHS  . midodrine  10 mg Oral Q T,Th,Sa-HD  . pantoprazole  40 mg Oral Daily  . pregabalin  150 mg Oral TID   . sodium chloride     sodium chloride, acetaminophen **OR** acetaminophen, albuterol, diphenhydrAMINE, guaiFENesin-dextromethorphan, hydrALAZINE, HYDROmorphone (DILAUDID) injection, labetalol, metoprolol tartrate, ondansetron, oxyCODONE-acetaminophen, phenol, polyethylene glycol

## 2020-04-07 NOTE — TOC Transition Note (Signed)
Transition of Care (TOC) - CM/SW Discharge Note Marvetta Gibbons RN, BSN Transitions of Care Unit 4E- RN Case Manager See Treatment Team for direct phone #    Patient Details  Name: [name] MRN: [id number] Date of Birth: 1950-12-03  Transition of [facility]) CM/SW Contact:  Dawayne Patricia, RN Phone Number: 04/07/2020, 12:16 PM   Clinical Narrative:    PT stable for transition home today, pt has declined rehab and desires to return home with [facility] services. Pt was active with [facility] prior to admit for HHRN/PT/OT/SW- orders have been placed for RN/PT/OT to resume at discharge. Notified Tiffany with [facility] that pt would transition home today- they will f/u with pt for resumption of [place] services. No DME needs- pt has what he needs at home.    Final next level of care: [place] Barriers to Discharge: No Barriers Identified   Patient Goals and CMS Choice Patient states their goals for this hospitalization and ongoing recovery are:: return home CMS Medicare.gov Compare Post Acute Care list provided to:: Patient Choice offered to / list presented to : Patient  Discharge Placement               Home with [facility]        Discharge Plan and Services   Discharge Planning Services: CM Consult Post Acute Care Choice: Home Health, Resumption of Svcs/PTA Provider          DME Arranged: N/A DME Agency: NA       HH Arranged: RN, PT, OT HH Agency: [facility] (formerly [employer]) Date [place]: 04/07/20 Time [place]: 1215 Representative spoke with at [place]: [place] ([place]) Interventions     Readmission Risk Interventions Readmission Risk Prevention Plan 04/07/2020 05/01/2019  Post Dischage Appt Complete -  Medication Screening Complete -  Transportation Screening Complete Complete  Medication Review Press photographer) - Complete  PCP or Specialist appointment within 3-5 days of discharge -  Complete  HRI or [place] - Complete  SW Recovery Care/Counseling Consult - Complete  [place] - Not Applicable  Some recent data might be hidden

## 2020-04-08 ENCOUNTER — Other Ambulatory Visit: Payer: Self-pay

## 2020-04-08 ENCOUNTER — Other Ambulatory Visit: Payer: Self-pay | Admitting: *Deleted

## 2020-04-08 ENCOUNTER — Encounter: Payer: Self-pay | Admitting: *Deleted

## 2020-04-08 DIAGNOSIS — I739 Peripheral vascular disease, unspecified: Secondary | ICD-10-CM

## 2020-04-08 NOTE — Patient Outreach (Addendum)
[name], new referral PCP office completes Transition of Care outreach post-hospital discharge ([facility]) Post-hospital discharge day # 2 Unsuccessful telephone outreach attempt # 1 04/08/2020  [name] March 18, 1951 [id number]  Unsuccessful telephone outreach to [name], 69 y/o male referred to [facility] RN CM 04/07/20 by [facility] Liaison after patient had recent elective hospitalization August 23-26, 2021 for (L) fem-pop bypass, which was completed 04/04/20.  Patient was discharged home to self- care with ongoing home health services through [facility] for RN/ PT/ OT.  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  Prior to most recent (elective/ planned) hospitalization, patient has not experienced any recent unplanned hospital admissions.  Call attempt today was completed with person identifying herself as patient's spouse; purpose of call and [facility] CM services briefly discussed; male person declined call/ providing HIPAA identifiers today and reports that she prefers for patient to complete call.  Reports patient currently lying down and unavailable to talk with me; she requests call back next week.  I verified that patient's home number is the best outreach number, and shared with spouse that another (primary) [facility] RN CM would re-attempt outreach next week- she is in agreement.  Plan:  Will make patient's primary [facility] RN CM aware of unsuccessful initial outreach attempt today to ensure second attempt completed within 4 business days  Will place [facility] CM unsuccessful outreach letter in mail to patient  Oneta Rack, RN, BSN, [place] Care Management  [phone]

## 2020-04-09 DIAGNOSIS — N186 End stage renal disease: Secondary | ICD-10-CM | POA: Diagnosis not present

## 2020-04-09 DIAGNOSIS — D631 Anemia in chronic kidney disease: Secondary | ICD-10-CM | POA: Diagnosis not present

## 2020-04-09 DIAGNOSIS — Z992 Dependence on renal dialysis: Secondary | ICD-10-CM | POA: Diagnosis not present

## 2020-04-09 DIAGNOSIS — N2581 Secondary hyperparathyroidism of renal origin: Secondary | ICD-10-CM | POA: Diagnosis not present

## 2020-04-10 DIAGNOSIS — I120 Hypertensive chronic kidney disease with stage 5 chronic kidney disease or end stage renal disease: Secondary | ICD-10-CM | POA: Diagnosis not present

## 2020-04-10 DIAGNOSIS — E11621 Type 2 diabetes mellitus with foot ulcer: Secondary | ICD-10-CM | POA: Diagnosis not present

## 2020-04-10 DIAGNOSIS — E1122 Type 2 diabetes mellitus with diabetic chronic kidney disease: Secondary | ICD-10-CM | POA: Diagnosis not present

## 2020-04-10 DIAGNOSIS — L97421 Non-pressure chronic ulcer of left heel and midfoot limited to breakdown of skin: Secondary | ICD-10-CM | POA: Diagnosis not present

## 2020-04-10 DIAGNOSIS — S81812D Laceration without foreign body, left lower leg, subsequent encounter: Secondary | ICD-10-CM | POA: Diagnosis not present

## 2020-04-10 DIAGNOSIS — S81802D Unspecified open wound, left lower leg, subsequent encounter: Secondary | ICD-10-CM | POA: Diagnosis not present

## 2020-04-11 ENCOUNTER — Ambulatory Visit: Payer: Medicare Other | Admitting: Cardiovascular Disease

## 2020-04-11 DIAGNOSIS — S81802D Unspecified open wound, left lower leg, subsequent encounter: Secondary | ICD-10-CM | POA: Diagnosis not present

## 2020-04-11 DIAGNOSIS — E1122 Type 2 diabetes mellitus with diabetic chronic kidney disease: Secondary | ICD-10-CM | POA: Diagnosis not present

## 2020-04-11 DIAGNOSIS — I120 Hypertensive chronic kidney disease with stage 5 chronic kidney disease or end stage renal disease: Secondary | ICD-10-CM | POA: Diagnosis not present

## 2020-04-11 DIAGNOSIS — L97421 Non-pressure chronic ulcer of left heel and midfoot limited to breakdown of skin: Secondary | ICD-10-CM | POA: Diagnosis not present

## 2020-04-11 DIAGNOSIS — E11621 Type 2 diabetes mellitus with foot ulcer: Secondary | ICD-10-CM | POA: Diagnosis not present

## 2020-04-11 DIAGNOSIS — S81812D Laceration without foreign body, left lower leg, subsequent encounter: Secondary | ICD-10-CM | POA: Diagnosis not present

## 2020-04-12 DIAGNOSIS — Z79899 Other long term (current) drug therapy: Secondary | ICD-10-CM | POA: Diagnosis not present

## 2020-04-12 DIAGNOSIS — D631 Anemia in chronic kidney disease: Secondary | ICD-10-CM | POA: Diagnosis not present

## 2020-04-12 DIAGNOSIS — N2581 Secondary hyperparathyroidism of renal origin: Secondary | ICD-10-CM | POA: Diagnosis not present

## 2020-04-12 DIAGNOSIS — Z992 Dependence on renal dialysis: Secondary | ICD-10-CM | POA: Diagnosis not present

## 2020-04-12 DIAGNOSIS — D509 Iron deficiency anemia, unspecified: Secondary | ICD-10-CM | POA: Diagnosis not present

## 2020-04-12 DIAGNOSIS — N186 End stage renal disease: Secondary | ICD-10-CM | POA: Diagnosis not present

## 2020-04-13 ENCOUNTER — Other Ambulatory Visit: Payer: Self-pay

## 2020-04-13 DIAGNOSIS — G43909 Migraine, unspecified, not intractable, without status migrainosus: Secondary | ICD-10-CM | POA: Diagnosis not present

## 2020-04-13 DIAGNOSIS — I160 Hypertensive urgency: Secondary | ICD-10-CM | POA: Diagnosis not present

## 2020-04-13 DIAGNOSIS — F419 Anxiety disorder, unspecified: Secondary | ICD-10-CM | POA: Diagnosis not present

## 2020-04-13 DIAGNOSIS — Z48812 Encounter for surgical aftercare following surgery on the circulatory system: Secondary | ICD-10-CM | POA: Diagnosis not present

## 2020-04-13 DIAGNOSIS — D631 Anemia in chronic kidney disease: Secondary | ICD-10-CM | POA: Diagnosis not present

## 2020-04-13 DIAGNOSIS — K219 Gastro-esophageal reflux disease without esophagitis: Secondary | ICD-10-CM | POA: Diagnosis not present

## 2020-04-13 DIAGNOSIS — L03116 Cellulitis of left lower limb: Secondary | ICD-10-CM | POA: Diagnosis not present

## 2020-04-13 DIAGNOSIS — Z992 Dependence on renal dialysis: Secondary | ICD-10-CM | POA: Diagnosis not present

## 2020-04-13 DIAGNOSIS — F329 Major depressive disorder, single episode, unspecified: Secondary | ICD-10-CM | POA: Diagnosis not present

## 2020-04-13 DIAGNOSIS — L97521 Non-pressure chronic ulcer of other part of left foot limited to breakdown of skin: Secondary | ICD-10-CM | POA: Diagnosis not present

## 2020-04-13 DIAGNOSIS — M1991 Primary osteoarthritis, unspecified site: Secondary | ICD-10-CM | POA: Diagnosis not present

## 2020-04-13 DIAGNOSIS — N2581 Secondary hyperparathyroidism of renal origin: Secondary | ICD-10-CM | POA: Diagnosis not present

## 2020-04-13 DIAGNOSIS — E1151 Type 2 diabetes mellitus with diabetic peripheral angiopathy without gangrene: Secondary | ICD-10-CM | POA: Diagnosis not present

## 2020-04-13 DIAGNOSIS — E785 Hyperlipidemia, unspecified: Secondary | ICD-10-CM | POA: Diagnosis not present

## 2020-04-13 DIAGNOSIS — Z4801 Encounter for change or removal of surgical wound dressing: Secondary | ICD-10-CM | POA: Diagnosis not present

## 2020-04-13 DIAGNOSIS — E1142 Type 2 diabetes mellitus with diabetic polyneuropathy: Secondary | ICD-10-CM | POA: Diagnosis not present

## 2020-04-13 DIAGNOSIS — J449 Chronic obstructive pulmonary disease, unspecified: Secondary | ICD-10-CM | POA: Diagnosis not present

## 2020-04-13 DIAGNOSIS — J9601 Acute respiratory failure with hypoxia: Secondary | ICD-10-CM | POA: Diagnosis not present

## 2020-04-13 DIAGNOSIS — N186 End stage renal disease: Secondary | ICD-10-CM | POA: Diagnosis not present

## 2020-04-13 DIAGNOSIS — F17210 Nicotine dependence, cigarettes, uncomplicated: Secondary | ICD-10-CM | POA: Diagnosis not present

## 2020-04-13 DIAGNOSIS — R1310 Dysphagia, unspecified: Secondary | ICD-10-CM | POA: Diagnosis not present

## 2020-04-13 DIAGNOSIS — E039 Hypothyroidism, unspecified: Secondary | ICD-10-CM | POA: Diagnosis not present

## 2020-04-13 DIAGNOSIS — D509 Iron deficiency anemia, unspecified: Secondary | ICD-10-CM | POA: Diagnosis not present

## 2020-04-13 DIAGNOSIS — E11621 Type 2 diabetes mellitus with foot ulcer: Secondary | ICD-10-CM | POA: Diagnosis not present

## 2020-04-13 DIAGNOSIS — I120 Hypertensive chronic kidney disease with stage 5 chronic kidney disease or end stage renal disease: Secondary | ICD-10-CM | POA: Diagnosis not present

## 2020-04-13 DIAGNOSIS — S81802D Unspecified open wound, left lower leg, subsequent encounter: Secondary | ICD-10-CM | POA: Diagnosis not present

## 2020-04-13 DIAGNOSIS — I2510 Atherosclerotic heart disease of native coronary artery without angina pectoris: Secondary | ICD-10-CM | POA: Diagnosis not present

## 2020-04-13 DIAGNOSIS — E1122 Type 2 diabetes mellitus with diabetic chronic kidney disease: Secondary | ICD-10-CM | POA: Diagnosis not present

## 2020-04-13 DIAGNOSIS — Z89611 Acquired absence of right leg above knee: Secondary | ICD-10-CM | POA: Diagnosis not present

## 2020-04-13 NOTE — Patient Outreach (Signed)
[place] [facility]) Care Management  04/13/2020  SEIJI WISWELL 07-15-1951 [id number]   Telephone assessment:  Reviewed previous telephone attempt from Reginia Naas RN.   Placed call to patient at home and cell phone without an answer.The patient left the office before the visit was finished. Message on cell phone.  PLAN: will attempt again in 4 business days. Letter already mailed.  Tomasa Rand, RN, BSN, CEN [facility] [employer] [phone]

## 2020-04-14 DIAGNOSIS — N186 End stage renal disease: Secondary | ICD-10-CM | POA: Diagnosis not present

## 2020-04-14 DIAGNOSIS — Z992 Dependence on renal dialysis: Secondary | ICD-10-CM | POA: Diagnosis not present

## 2020-04-14 DIAGNOSIS — D509 Iron deficiency anemia, unspecified: Secondary | ICD-10-CM | POA: Diagnosis not present

## 2020-04-14 DIAGNOSIS — L03116 Cellulitis of left lower limb: Secondary | ICD-10-CM | POA: Diagnosis not present

## 2020-04-14 DIAGNOSIS — D631 Anemia in chronic kidney disease: Secondary | ICD-10-CM | POA: Diagnosis not present

## 2020-04-14 DIAGNOSIS — H4710 Unspecified papilledema: Secondary | ICD-10-CM | POA: Diagnosis not present

## 2020-04-14 DIAGNOSIS — N2581 Secondary hyperparathyroidism of renal origin: Secondary | ICD-10-CM | POA: Diagnosis not present

## 2020-04-15 ENCOUNTER — Telehealth: Payer: Self-pay | Admitting: Neurology

## 2020-04-15 DIAGNOSIS — E11621 Type 2 diabetes mellitus with foot ulcer: Secondary | ICD-10-CM | POA: Diagnosis not present

## 2020-04-15 DIAGNOSIS — S81802D Unspecified open wound, left lower leg, subsequent encounter: Secondary | ICD-10-CM | POA: Diagnosis not present

## 2020-04-15 DIAGNOSIS — I120 Hypertensive chronic kidney disease with stage 5 chronic kidney disease or end stage renal disease: Secondary | ICD-10-CM | POA: Diagnosis not present

## 2020-04-15 DIAGNOSIS — L97521 Non-pressure chronic ulcer of other part of left foot limited to breakdown of skin: Secondary | ICD-10-CM | POA: Diagnosis not present

## 2020-04-15 DIAGNOSIS — Z48812 Encounter for surgical aftercare following surgery on the circulatory system: Secondary | ICD-10-CM | POA: Diagnosis not present

## 2020-04-15 DIAGNOSIS — Z4801 Encounter for change or removal of surgical wound dressing: Secondary | ICD-10-CM | POA: Diagnosis not present

## 2020-04-15 NOTE — Telephone Encounter (Signed)
Pt is sch for VV 04/20/20 @ 8:45a. We will have to send the link to his daughters number. He is going to verify with her later today and if she isn't available he will call back to resch.

## 2020-04-15 NOTE — Telephone Encounter (Signed)
Dr Tomi Likens was on call and spoke with Dr. Valetta Close about this patient.  He did have VF loss and bilateral papilledema.  Pt has had normal MRI brain at [facility] in last few weeks.  Pt will need to be schedule for LP.  Please put pt on my schedule at 8:45 next wed to discuss.  VV is fine.  I just want to make sure that I answer questions.  Will need MRV as well.  Marland Kitchen

## 2020-04-16 DIAGNOSIS — N2581 Secondary hyperparathyroidism of renal origin: Secondary | ICD-10-CM | POA: Diagnosis not present

## 2020-04-16 DIAGNOSIS — D509 Iron deficiency anemia, unspecified: Secondary | ICD-10-CM | POA: Diagnosis not present

## 2020-04-16 DIAGNOSIS — Z992 Dependence on renal dialysis: Secondary | ICD-10-CM | POA: Diagnosis not present

## 2020-04-16 DIAGNOSIS — D631 Anemia in chronic kidney disease: Secondary | ICD-10-CM | POA: Diagnosis not present

## 2020-04-16 DIAGNOSIS — N186 End stage renal disease: Secondary | ICD-10-CM | POA: Diagnosis not present

## 2020-04-16 DIAGNOSIS — L03116 Cellulitis of left lower limb: Secondary | ICD-10-CM | POA: Diagnosis not present

## 2020-04-19 ENCOUNTER — Other Ambulatory Visit: Payer: Self-pay

## 2020-04-19 DIAGNOSIS — N2581 Secondary hyperparathyroidism of renal origin: Secondary | ICD-10-CM | POA: Diagnosis not present

## 2020-04-19 DIAGNOSIS — Z992 Dependence on renal dialysis: Secondary | ICD-10-CM | POA: Diagnosis not present

## 2020-04-19 DIAGNOSIS — N186 End stage renal disease: Secondary | ICD-10-CM | POA: Diagnosis not present

## 2020-04-19 DIAGNOSIS — D631 Anemia in chronic kidney disease: Secondary | ICD-10-CM | POA: Diagnosis not present

## 2020-04-19 DIAGNOSIS — D509 Iron deficiency anemia, unspecified: Secondary | ICD-10-CM | POA: Diagnosis not present

## 2020-04-19 DIAGNOSIS — L03116 Cellulitis of left lower limb: Secondary | ICD-10-CM | POA: Diagnosis not present

## 2020-04-19 NOTE — Patient Outreach (Signed)
[place] [facility]) Care Management  04/19/2020  [name] 1951/08/07 [id number]   Telephone assessment:  3rd unsuccessful attempt to contact patient. Left voicemail requesting a call back.  PLAN: will attempt again in 4 weeks.   Tomasa Rand, RN, BSN, CEN [facility] [employer] [phone]

## 2020-04-19 NOTE — Progress Notes (Deleted)
Virtual Visit Via Video   The purpose of this virtual visit is to provide medical care while limiting exposure to the novel coronavirus.    Consent was obtained for video visit:  {yes no:314532} Answered questions that patient had about telehealth interaction:  {yes no:314532} I discussed the limitations, risks, security and privacy concerns of performing an evaluation and management service by telemedicine. I also discussed with the patient that there may be a patient responsible charge related to this service. The patient expressed understanding and agreed to proceed.  Pt location: Home Physician Location: office Name of referring provider:  Caryl Bis, MD I connected with [name] at patients initiation/request on 04/20/2020 at  8:45 AM EDT by video enabled telemedicine application and verified that I am speaking with the correct person using two identifiers. Pt MRN:  [id number] Pt DOB:  July 08, 1951 Video Participants:  [name];  ***  Assessment/Plan:   ***1.  Papilledema visual field loss  -The patient will be scheduled for lumbar puncture.  Discussed risks, benefits, and side effects.  No aspirin or aspirin-containing products 5 days prior to the procedure.  -Patient to be scheduled for MRV brain  2.  Left carotid stenosis, 60 to 79%  -Patient already sees Dr. [name] and has been referred back to vascular vein for management/consultation.  Subjective   ***Patient seen today in follow-up as a work in.  Patient had left carotid stenosis of 60 to 79%.  The right carotid was normal.  Patient did have left common femoral endarterectomy/left fem-pop bypass since our last visit and has recovered well from that.  He saw Dr. Valetta Close, and good, in fact, have bilateral papilledema.  There was visual field loss.  I do not have a copy of those notes, but Dr. Valetta Close did contact my partner and they discussed the case on the phone.  Current movement d/o meds:  ***   Current  Outpatient Medications on File Prior to Visit  Medication Sig Dispense Refill  . albuterol (VENTOLIN HFA) 108 (90 Base) MCG/ACT inhaler Inhale 2 puffs into the lungs every 6 (six) hours as needed for wheezing or shortness of breath.     . ARTIFICIAL TEAR OP Place 1 drop into both eyes every 6 (six) hours as needed (dry eyes).    Marland Kitchen atorvastatin (LIPITOR) 80 MG tablet Take 1 tablet (80 mg total) by mouth at bedtime. (Patient taking differently: Take 80 mg by mouth daily. ) 30 tablet 5  . bismuth subsalicylate (PEPTO BISMOL) 262 MG/15ML suspension Take 30 mLs by mouth every 6 (six) hours as needed for indigestion.    . calcium acetate (PHOSLO) 667 MG capsule Take 4 capsules (2,668 mg total) by mouth 3 (three) times daily with meals. (Patient taking differently: Take 1,334-2,001 mg by mouth See admin instructions. Take 2001 mg with meals and 1334 with each snack) 360 capsule 0  . cinacalcet (SENSIPAR) 30 MG tablet Take 1 tablet (30 mg total) by mouth every evening. 60 tablet 0  . clopidogrel (PLAVIX) 75 MG tablet Take 1 tablet (75 mg total) by mouth daily. 30 tablet 0  . diphenhydrAMINE (BENADRYL) 25 MG tablet Take 25 mg by mouth daily as needed for itching.    . DULoxetine (CYMBALTA) 30 MG capsule Take 30 mg by mouth daily.    Marland Kitchen ibuprofen (ADVIL) 200 MG tablet Take 400 mg by mouth 3 (three) times daily.     Marland Kitchen levothyroxine (SYNTHROID) 125 MCG tablet Take 1 tablet (125 mcg  total) by mouth daily before breakfast. (Patient taking differently: Take 125 mcg by mouth at bedtime. ) 30 tablet 0  . lisinopril (ZESTRIL) 10 MG tablet Take 10 mg by mouth 4 (four) times a week. Take 10 mg daily on non dialysis days (Sun, Mon, Wed, and Fri)    . Melatonin 10 MG CAPS Take 10 mg by mouth at bedtime.    . midodrine (PROAMATINE) 10 MG tablet Take 10 mg by mouth Every Tuesday,Thursday,and Saturday with dialysis.    Marland Kitchen multivitamin (RENA-VIT) TABS tablet Take 1 tablet by mouth daily.    Marland Kitchen oxyCODONE-acetaminophen (PERCOCET)  5-325 MG tablet Take 1 tablet by mouth every 6 (six) hours as needed for severe pain. 30 tablet 0  . polyethylene glycol (MIRALAX / GLYCOLAX) 17 g packet Take 17 g by mouth daily as needed for moderate constipation.    . pregabalin (LYRICA) 150 MG capsule Take 1 capsule (150 mg total) by mouth 3 (three) times daily. 90 capsule 0  . sevelamer carbonate (RENVELA) 800 MG tablet Take 1,600-3,200 mg by mouth See admin instructions. Take 3200 mg with each meal and 1600 mg with each snack    . TOUJEO SOLOSTAR 300 UNIT/ML SOPN Inject 54-78 Units as directed at bedtime as needed (if blood sugar is 130 or higher).      Current Facility-Administered Medications on File Prior to Visit  Medication Dose Route Frequency Provider Last Rate Last Admin  . 0.9 %  sodium chloride infusion  250 mL Intravenous PRN [name], MD         Objective   There were no vitals filed for this visit. GEN:  The patient appears stated age and is in NAD.  Neurological examination:  Orientation: The patient is alert and oriented x3. Cranial nerves: There is good facial symmetry. There is ***facial hypomimia.  The speech is fluent and clear. Soft palate rises symmetrically and there is no tongue deviation. Hearing is intact to conversational tone. Motor: Strength is at least antigravity x 4.   Shoulder shrug is equal and symmetric.  There is no pronator drift.  Movement examination: Tone: unable Abnormal movements: *** Coordination:  There is *** decremation with RAM's, *** Gait and Station: The patient has *** difficulty arising out of a deep-seated chair without the use of the hands. The patient's stride length is ***.      Follow up Instructions      -I discussed the assessment and treatment plan with the patient. The patient was provided an opportunity to ask questions and all were answered. The patient agreed with the plan and demonstrated an understanding of the instructions.   The patient was advised to  call back or seek an in-person evaluation if the symptoms worsen or if the condition fails to improve as anticipated.    Total time spent on today's visit was ***minutes, including both face-to-face time and nonface-to-face time.  Time included that spent on review of records (prior notes available to me/labs/imaging if pertinent), discussing treatment and goals, answering patient's questions and coordinating care.   Alonza Bogus, DO

## 2020-04-20 ENCOUNTER — Telehealth: Payer: Medicare Other | Admitting: Neurology

## 2020-04-20 DIAGNOSIS — S81802D Unspecified open wound, left lower leg, subsequent encounter: Secondary | ICD-10-CM | POA: Diagnosis not present

## 2020-04-20 DIAGNOSIS — E11621 Type 2 diabetes mellitus with foot ulcer: Secondary | ICD-10-CM | POA: Diagnosis not present

## 2020-04-20 DIAGNOSIS — I120 Hypertensive chronic kidney disease with stage 5 chronic kidney disease or end stage renal disease: Secondary | ICD-10-CM | POA: Diagnosis not present

## 2020-04-20 DIAGNOSIS — L97521 Non-pressure chronic ulcer of other part of left foot limited to breakdown of skin: Secondary | ICD-10-CM | POA: Diagnosis not present

## 2020-04-20 DIAGNOSIS — Z48812 Encounter for surgical aftercare following surgery on the circulatory system: Secondary | ICD-10-CM | POA: Diagnosis not present

## 2020-04-20 DIAGNOSIS — Z4801 Encounter for change or removal of surgical wound dressing: Secondary | ICD-10-CM | POA: Diagnosis not present

## 2020-04-21 DIAGNOSIS — D631 Anemia in chronic kidney disease: Secondary | ICD-10-CM | POA: Diagnosis not present

## 2020-04-21 DIAGNOSIS — Z992 Dependence on renal dialysis: Secondary | ICD-10-CM | POA: Diagnosis not present

## 2020-04-21 DIAGNOSIS — D509 Iron deficiency anemia, unspecified: Secondary | ICD-10-CM | POA: Diagnosis not present

## 2020-04-21 DIAGNOSIS — N2581 Secondary hyperparathyroidism of renal origin: Secondary | ICD-10-CM | POA: Diagnosis not present

## 2020-04-21 DIAGNOSIS — L03116 Cellulitis of left lower limb: Secondary | ICD-10-CM | POA: Diagnosis not present

## 2020-04-21 DIAGNOSIS — N186 End stage renal disease: Secondary | ICD-10-CM | POA: Diagnosis not present

## 2020-04-21 NOTE — Progress Notes (Signed)
Assessment/Plan:   1.  Papilledema visual field loss  -The patient will be scheduled for lumbar puncture if able to get off of the Plavix.  Discussed risks, benefits, and side effects.  No aspirin or aspirin-containing products 5 days prior to the procedure.  Plavix will need to be held.  Will need to ask Dr. [name] if okay to hold the Plavix since just had surgery.  Called the office multiple times.  Dr. [name] is out for the week.  We have asked if one of his partners can review the chart to see if Plavix can be held, especially since the patient just had surgery.  Letter of request sent to Dr. [name] office.  In addition, the patient is concerned that he has infection from his current surgery and that will need to be cleared before lumbar puncture.    Given that Dr. [name] is out of the office, I did ask him to follow-up with his primary care and the patient does have an appointment soon (Friday).  -Patient and I did discuss risk of permanent vision loss  -Patient to be scheduled for MRV brain  2.  Left carotid stenosis, 60 to 79%  -Patient already sees Dr. [name] and has been referred back to vascular vein for management/consultation.  Pt aware.  Subjective   Patient seen today in follow-up as a work in.  Patient had left carotid stenosis of 60 to 79%.  The right carotid was normal.  Patient did have left common femoral endarterectomy/left fem-pop bypass since our last visit and is recovering.  Pt thinks that he is getting an infections and has called the office but was told that Dr. [name] is on vacation.  He has visiting home health coming tomorrow.   He saw Dr. Valetta Close, and did, in fact, have bilateral papilledema.  There was visual field loss.  No excessive Vit A intake.      Current Outpatient Medications on File Prior to Visit  Medication Sig Dispense Refill  . albuterol (VENTOLIN HFA) 108 (90 Base) MCG/ACT inhaler Inhale 2 puffs into the lungs every 6 (six) hours as needed for  wheezing or shortness of breath.     . ARTIFICIAL TEAR OP Place 1 drop into both eyes every 6 (six) hours as needed (dry eyes).    Marland Kitchen atorvastatin (LIPITOR) 80 MG tablet Take 1 tablet (80 mg total) by mouth at bedtime. (Patient taking differently: Take 80 mg by mouth daily. ) 30 tablet 5  . bismuth subsalicylate (PEPTO BISMOL) 262 MG/15ML suspension Take 30 mLs by mouth every 6 (six) hours as needed for indigestion.    . calcium acetate (PHOSLO) 667 MG capsule Take 4 capsules (2,668 mg total) by mouth 3 (three) times daily with meals. (Patient taking differently: Take 1,334-2,001 mg by mouth See admin instructions. Take 2001 mg with meals and 1334 with each snack) 360 capsule 0  . cinacalcet (SENSIPAR) 30 MG tablet Take 1 tablet (30 mg total) by mouth every evening. 60 tablet 0  . clopidogrel (PLAVIX) 75 MG tablet Take 1 tablet (75 mg total) by mouth daily. 30 tablet 0  . diphenhydrAMINE (BENADRYL) 25 MG tablet Take 25 mg by mouth daily as needed for itching.    . DULoxetine (CYMBALTA) 30 MG capsule Take 30 mg by mouth daily.    Marland Kitchen ibuprofen (ADVIL) 200 MG tablet Take 400 mg by mouth 3 (three) times daily.     Marland Kitchen levothyroxine (SYNTHROID) 125 MCG tablet Take 1 tablet (  125 mcg total) by mouth daily before breakfast. (Patient taking differently: Take 125 mcg by mouth at bedtime. ) 30 tablet 0  . lisinopril (ZESTRIL) 10 MG tablet Take 10 mg by mouth 4 (four) times a week. Take 10 mg daily on non dialysis days (Sun, Mon, Wed, and Fri)    . Melatonin 10 MG CAPS Take 10 mg by mouth at bedtime.    . midodrine (PROAMATINE) 10 MG tablet Take 10 mg by mouth Every Tuesday,Thursday,and Saturday with dialysis.    Marland Kitchen multivitamin (RENA-VIT) TABS tablet Take 1 tablet by mouth daily.    . polyethylene glycol (MIRALAX / GLYCOLAX) 17 g packet Take 17 g by mouth daily as needed for moderate constipation.    . pregabalin (LYRICA) 150 MG capsule Take 1 capsule (150 mg total) by mouth 3 (three) times daily. 90 capsule 0  .  sevelamer carbonate (RENVELA) 800 MG tablet Take 1,600-3,200 mg by mouth See admin instructions. Take 3200 mg with each meal and 1600 mg with each snack    . TOUJEO SOLOSTAR 300 UNIT/ML SOPN Inject 54-78 Units as directed at bedtime as needed (if blood sugar is 130 or higher).      Current Facility-Administered Medications on File Prior to Visit  Medication Dose Route Frequency Provider Last Rate Last Admin  . 0.9 %  sodium chloride infusion  250 mL Intravenous PRN [name], MD         Objective   Vitals:   04/25/20 1400  BP: (!) 147/67  Pulse: 68  SpO2: 99%  Weight: 242 lb 8.1 oz (110 kg)  Height: 6\' 4"  (1.93 m)   GEN:  The patient appears stated age and is in NAD.  Neurological examination:  Orientation: The patient is alert and oriented x3. Cranial nerves: There is good facial symmetry.  VFF to confrontational testing.  There is no facial hypomimia.  The speech is fluent and clear. Soft palate rises symmetrically and there is no tongue deviation. Hearing is intact to conversational tone. Motor: Strength is at least antigravity x 4.   Shoulder shrug is equal and symmetric.  There is no pronator drift.  Movement examination: Tone: wnl Abnormal movements: he has L hand and R leg (stump) tremor. Coordination:  There is no decremation with RAM's Gait and Station: in [facility]    Follow up Instructions      -I discussed the assessment and treatment plan with the patient. The patient was provided an opportunity to ask questions and all were answered. The patient agreed with the plan and demonstrated an understanding of the instructions.   The patient was advised to call back or seek an in-person evaluation if the symptoms worsen or if the condition fails to improve as anticipated.    Total time spent on today's visit was 40 minutes, including both face-to-face time and nonface-to-face time.  Time included that spent on review of records (prior notes available to  me/labs/imaging if pertinent), discussing treatment and goals, answering patient's questions and coordinating care.   Alonza Bogus, DO

## 2020-04-22 ENCOUNTER — Telehealth: Payer: Self-pay

## 2020-04-22 DIAGNOSIS — Z4801 Encounter for change or removal of surgical wound dressing: Secondary | ICD-10-CM | POA: Diagnosis not present

## 2020-04-22 DIAGNOSIS — I120 Hypertensive chronic kidney disease with stage 5 chronic kidney disease or end stage renal disease: Secondary | ICD-10-CM | POA: Diagnosis not present

## 2020-04-22 DIAGNOSIS — L97521 Non-pressure chronic ulcer of other part of left foot limited to breakdown of skin: Secondary | ICD-10-CM | POA: Diagnosis not present

## 2020-04-22 DIAGNOSIS — Z48812 Encounter for surgical aftercare following surgery on the circulatory system: Secondary | ICD-10-CM | POA: Diagnosis not present

## 2020-04-22 DIAGNOSIS — S81802D Unspecified open wound, left lower leg, subsequent encounter: Secondary | ICD-10-CM | POA: Diagnosis not present

## 2020-04-22 DIAGNOSIS — E11621 Type 2 diabetes mellitus with foot ulcer: Secondary | ICD-10-CM | POA: Diagnosis not present

## 2020-04-22 NOTE — Telephone Encounter (Signed)
Spoke to [name] regarding pt's L groin incision with "solid white tissue starting to slightly look necrotic". She feels it is not staying adequately dry and is going to try a dry dressing with silver alginate over the area to keep it from getting moist. Spoke to pt who feels it is slightly red and swollen. He denies fever. I have offered him an appt to be seen Monday with another provider, but he prefers to see [name]. He wants to give it until next week to see if the dry dressing helps the area. If not, he will call us Monday to schedule an appt to be seen in office. We discussed importance of reporting to ED if he has severe pain, swelling, color changes, or fever. Pt verbalized understanding.

## 2020-04-23 DIAGNOSIS — D631 Anemia in chronic kidney disease: Secondary | ICD-10-CM | POA: Diagnosis not present

## 2020-04-23 DIAGNOSIS — Z992 Dependence on renal dialysis: Secondary | ICD-10-CM | POA: Diagnosis not present

## 2020-04-23 DIAGNOSIS — L03116 Cellulitis of left lower limb: Secondary | ICD-10-CM | POA: Diagnosis not present

## 2020-04-23 DIAGNOSIS — N2581 Secondary hyperparathyroidism of renal origin: Secondary | ICD-10-CM | POA: Diagnosis not present

## 2020-04-23 DIAGNOSIS — D509 Iron deficiency anemia, unspecified: Secondary | ICD-10-CM | POA: Diagnosis not present

## 2020-04-23 DIAGNOSIS — N186 End stage renal disease: Secondary | ICD-10-CM | POA: Diagnosis not present

## 2020-04-25 ENCOUNTER — Other Ambulatory Visit: Payer: Self-pay

## 2020-04-25 ENCOUNTER — Telehealth: Payer: Self-pay

## 2020-04-25 ENCOUNTER — Encounter: Payer: Self-pay | Admitting: Neurology

## 2020-04-25 ENCOUNTER — Ambulatory Visit (INDEPENDENT_AMBULATORY_CARE_PROVIDER_SITE_OTHER): Payer: Medicare Other | Admitting: Neurology

## 2020-04-25 VITALS — BP 147/67 | HR 68 | Ht 76.0 in | Wt 242.5 lb

## 2020-04-25 DIAGNOSIS — E782 Mixed hyperlipidemia: Secondary | ICD-10-CM | POA: Diagnosis not present

## 2020-04-25 DIAGNOSIS — D649 Anemia, unspecified: Secondary | ICD-10-CM | POA: Diagnosis not present

## 2020-04-25 DIAGNOSIS — H53453 Other localized visual field defect, bilateral: Secondary | ICD-10-CM | POA: Diagnosis not present

## 2020-04-25 DIAGNOSIS — E039 Hypothyroidism, unspecified: Secondary | ICD-10-CM | POA: Diagnosis not present

## 2020-04-25 DIAGNOSIS — E1142 Type 2 diabetes mellitus with diabetic polyneuropathy: Secondary | ICD-10-CM | POA: Diagnosis not present

## 2020-04-25 DIAGNOSIS — K219 Gastro-esophageal reflux disease without esophagitis: Secondary | ICD-10-CM | POA: Diagnosis not present

## 2020-04-25 DIAGNOSIS — I10 Essential (primary) hypertension: Secondary | ICD-10-CM | POA: Diagnosis not present

## 2020-04-25 DIAGNOSIS — E1122 Type 2 diabetes mellitus with diabetic chronic kidney disease: Secondary | ICD-10-CM | POA: Diagnosis not present

## 2020-04-25 DIAGNOSIS — H4710 Unspecified papilledema: Secondary | ICD-10-CM | POA: Diagnosis not present

## 2020-04-25 DIAGNOSIS — N189 Chronic kidney disease, unspecified: Secondary | ICD-10-CM | POA: Diagnosis not present

## 2020-04-25 NOTE — Telephone Encounter (Signed)
Spoke with someone at Dr [name] office who stated in order for them to give the ok for instructions that we must send something over in writing with the request.

## 2020-04-25 NOTE — Telephone Encounter (Signed)
Per Dr Tat:   please call to Dr. [name] office this AM (vascular and vein) and find out if we can hold the plavix 5 days for lumbar puncture/spinal tap. Please ask them to send in writing that okay to hold     Left a detailed message  On the nurse line for Dr [name] nurse. Requested a call back with any questions or concerns. Awaiting a call back.

## 2020-04-25 NOTE — Patient Instructions (Addendum)
A referral to [facility] has been placed for your MRVsomeone will contact you directly to schedule your appt. They are located at [place]. Please contact them directly by calling [phone] with any questions regarding your referral.  We will need clearance from Dr. [name] to get your spinal tap/lumbar puncture scheduled so that you can safely get off of the Plavix.  The physicians and staff at [facility] are committed to providing excellent care. You may receive a survey requesting feedback about your experience at our office. We strive to receive "very good" responses to the survey questions. If you feel that your experience would prevent you from giving the office a "very good " response, please contact our office to try to remedy the situation. We may be reached at [phone]. Thank you for taking the time out of your busy day to complete the survey.

## 2020-04-25 NOTE — Telephone Encounter (Addendum)
Letter faxed over to Dr [name] office.

## 2020-04-26 ENCOUNTER — Other Ambulatory Visit: Payer: Self-pay

## 2020-04-26 DIAGNOSIS — I6529 Occlusion and stenosis of unspecified carotid artery: Secondary | ICD-10-CM

## 2020-04-26 DIAGNOSIS — L03116 Cellulitis of left lower limb: Secondary | ICD-10-CM | POA: Diagnosis not present

## 2020-04-26 DIAGNOSIS — D631 Anemia in chronic kidney disease: Secondary | ICD-10-CM | POA: Diagnosis not present

## 2020-04-26 DIAGNOSIS — N2581 Secondary hyperparathyroidism of renal origin: Secondary | ICD-10-CM | POA: Diagnosis not present

## 2020-04-26 DIAGNOSIS — Z992 Dependence on renal dialysis: Secondary | ICD-10-CM | POA: Diagnosis not present

## 2020-04-26 DIAGNOSIS — D509 Iron deficiency anemia, unspecified: Secondary | ICD-10-CM | POA: Diagnosis not present

## 2020-04-26 DIAGNOSIS — N186 End stage renal disease: Secondary | ICD-10-CM | POA: Diagnosis not present

## 2020-04-26 NOTE — Addendum Note (Signed)
Addended by: Ulice Brilliant T on: 04/26/2020 08:56 AM   Modules accepted: Orders

## 2020-04-27 ENCOUNTER — Encounter: Payer: Self-pay | Admitting: *Deleted

## 2020-04-27 ENCOUNTER — Ambulatory Visit: Payer: Self-pay | Admitting: *Deleted

## 2020-04-27 ENCOUNTER — Other Ambulatory Visit (HOSPITAL_COMMUNITY)
Admission: RE | Admit: 2020-04-27 | Discharge: 2020-04-27 | Disposition: A | Discharge status: Home / Self Care | Payer: Medicare Other | Source: Ambulatory Visit | Attending: Vascular Surgery | Admitting: Vascular Surgery

## 2020-04-27 ENCOUNTER — Other Ambulatory Visit: Payer: Self-pay

## 2020-04-27 ENCOUNTER — Other Ambulatory Visit: Payer: Self-pay | Admitting: *Deleted

## 2020-04-27 ENCOUNTER — Ambulatory Visit (INDEPENDENT_AMBULATORY_CARE_PROVIDER_SITE_OTHER): Payer: Self-pay | Admitting: Physician Assistant

## 2020-04-27 ENCOUNTER — Telehealth: Payer: Self-pay

## 2020-04-27 VITALS — BP 126/63 | HR 64 | Temp 98.2°F | Resp 20 | Ht 76.0 in | Wt 243.4 lb

## 2020-04-27 DIAGNOSIS — Z48812 Encounter for surgical aftercare following surgery on the circulatory system: Secondary | ICD-10-CM | POA: Diagnosis not present

## 2020-04-27 DIAGNOSIS — Z4801 Encounter for change or removal of surgical wound dressing: Secondary | ICD-10-CM | POA: Diagnosis not present

## 2020-04-27 DIAGNOSIS — I739 Peripheral vascular disease, unspecified: Secondary | ICD-10-CM

## 2020-04-27 DIAGNOSIS — S81802D Unspecified open wound, left lower leg, subsequent encounter: Secondary | ICD-10-CM | POA: Diagnosis not present

## 2020-04-27 DIAGNOSIS — L97521 Non-pressure chronic ulcer of other part of left foot limited to breakdown of skin: Secondary | ICD-10-CM | POA: Diagnosis not present

## 2020-04-27 DIAGNOSIS — E11621 Type 2 diabetes mellitus with foot ulcer: Secondary | ICD-10-CM | POA: Diagnosis not present

## 2020-04-27 DIAGNOSIS — Z01812 Encounter for preprocedural laboratory examination: Secondary | ICD-10-CM | POA: Diagnosis not present

## 2020-04-27 DIAGNOSIS — Z20822 Contact with and (suspected) exposure to covid-19: Secondary | ICD-10-CM | POA: Insufficient documentation

## 2020-04-27 DIAGNOSIS — I120 Hypertensive chronic kidney disease with stage 5 chronic kidney disease or end stage renal disease: Secondary | ICD-10-CM | POA: Diagnosis not present

## 2020-04-27 DIAGNOSIS — T8189XA Other complications of procedures, not elsewhere classified, initial encounter: Secondary | ICD-10-CM

## 2020-04-27 LAB — SARS CORONAVIRUS 2 (TAT 6-24 HRS): SARS Coronavirus 2: NEGATIVE

## 2020-04-27 MED ORDER — OXYCODONE-ACETAMINOPHEN 5-325 MG PO TABS: 1.0000 | ORAL_TABLET | ORAL | 0 refills | Status: DC | PRN

## 2020-04-27 NOTE — H&P (View-Only) (Signed)
Established Previous Bypass   History of Present Illness   [name] is a 69 y.o. (08-Dec-1950) male who presents with chief complaint: non healing surgical wound with drainage and odor.  He is 3 weeks status post left common femoral endarterectomy and femoral to below the knee popliteal bypass with vein by Dr. [name] on 04/04/2020.  This was done due to a nonhealing left foot wound.  He returns to clinic today due to drainage and a odor coming from his proximalmost vein harvest incision.  He also reports some drainage from his left groin incision however skin is well approximated here.  He started receiving IV antibiotics during HD treatment yesterday.  He denies any fevers, chills, nausea/vomiting.  He also has a right above-the-knee amputation.   Current Outpatient Medications  Medication Sig Dispense Refill  . albuterol (VENTOLIN HFA) 108 (90 Base) MCG/ACT inhaler Inhale 2 puffs into the lungs every 6 (six) hours as needed for wheezing or shortness of breath.     . ARTIFICIAL TEAR OP Place 1 drop into both eyes every 6 (six) hours as needed (dry eyes).    Marland Kitchen atorvastatin (LIPITOR) 80 MG tablet Take 1 tablet (80 mg total) by mouth at bedtime. (Patient taking differently: Take 80 mg by mouth daily. ) 30 tablet 5  . bismuth subsalicylate (PEPTO BISMOL) 262 MG/15ML suspension Take 30 mLs by mouth every 6 (six) hours as needed for indigestion.    . calcium acetate (PHOSLO) 667 MG capsule Take 4 capsules (2,668 mg total) by mouth 3 (three) times daily with meals. (Patient taking differently: Take 1,334-2,001 mg by mouth See admin instructions. Take 2001 mg with meals and 1334 with each snack) 360 capsule 0  . cinacalcet (SENSIPAR) 30 MG tablet Take 1 tablet (30 mg total) by mouth every evening. 60 tablet 0  . clopidogrel (PLAVIX) 75 MG tablet Take 1 tablet (75 mg total) by mouth daily. 30 tablet 0  . diphenhydrAMINE (BENADRYL) 25 MG tablet Take 25 mg by mouth daily as needed for itching.    .  DULoxetine (CYMBALTA) 30 MG capsule Take 30 mg by mouth daily.    Marland Kitchen ibuprofen (ADVIL) 200 MG tablet Take 400 mg by mouth 3 (three) times daily.     Marland Kitchen levothyroxine (SYNTHROID) 125 MCG tablet Take 1 tablet (125 mcg total) by mouth daily before breakfast. (Patient taking differently: Take 125 mcg by mouth at bedtime. ) 30 tablet 0  . lisinopril (ZESTRIL) 10 MG tablet Take 10 mg by mouth 4 (four) times a week. Take 10 mg daily on non dialysis days (Sun, Mon, Wed, and Fri)    . Melatonin 10 MG CAPS Take 10 mg by mouth at bedtime.    . midodrine (PROAMATINE) 10 MG tablet Take 10 mg by mouth Every Tuesday,Thursday,and Saturday with dialysis.    Marland Kitchen multivitamin (RENA-VIT) TABS tablet Take 1 tablet by mouth daily.    . polyethylene glycol (MIRALAX / GLYCOLAX) 17 g packet Take 17 g by mouth daily as needed for moderate constipation.    . pregabalin (LYRICA) 150 MG capsule Take 1 capsule (150 mg total) by mouth 3 (three) times daily. 90 capsule 0  . sevelamer carbonate (RENVELA) 800 MG tablet Take 1,600-3,200 mg by mouth See admin instructions. Take 3200 mg with each meal and 1600 mg with each snack    . TOUJEO SOLOSTAR 300 UNIT/ML SOPN Inject 54-78 Units as directed at bedtime as needed (if blood sugar is 130 or higher).  Current Facility-Administered Medications  Medication Dose Route Frequency Provider Last Rate Last Admin  . 0.9 %  sodium chloride infusion  250 mL Intravenous PRN [name], MD        REVIEW OF SYSTEMS (negative unless checked):   Cardiac:  []  Chest pain or chest pressure? []  Shortness of breath upon activity? []  Shortness of breath when lying flat? []  Irregular heart rhythm?  Vascular:  []  Pain in calf, thigh, or hip brought on by walking? []  Pain in feet at night that wakes you up from your sleep? []  Blood clot in your veins? []  Leg swelling?  Pulmonary:  []  Oxygen at home? []  Productive cough? []  Wheezing?  Neurologic:  []  Sudden weakness in arms or  legs? []  Sudden numbness in arms or legs? []  Sudden onset of difficult speaking or slurred speech? []  Temporary loss of vision in one eye? []  Problems with dizziness?  Gastrointestinal:  []  Blood in stool? []  Vomited blood?  Genitourinary:  []  Burning when urinating? []  Blood in urine?  Psychiatric:  []  Major depression  Hematologic:  []  Bleeding problems? []  Problems with blood clotting?  Dermatologic:  []  Rashes or ulcers?  Constitutional:  []  Fever or chills?  Ear/Nose/Throat:  []  Change in hearing? []  Nose bleeds? []  Sore throat?  Musculoskeletal:  []  Back pain? []  Joint pain? []  Muscle pain?   Physical Examination   Vitals:   04/27/20 0917  BP: 126/63  Pulse: 64  Resp: 20  Temp: 98.2 F (36.8 C)  TempSrc: Temporal  SpO2: 99%  Weight: 243 lb 6.2 oz (110.4 kg)  Height: 6\' 4"  (1.93 m)   Body mass index is 29.63 kg/m.  General:  WDWN in NAD; vital signs documented above Gait: Not observed HENT: WNL, normocephalic Pulmonary: normal non-labored breathing , without Rales, rhonchi,  wheezing Cardiac: regular HR Abdomen: soft, NT, no masses Skin: without rashes Vascular Exam/Pulses:  Right Left  DP  amputation absent  PT  amputation  brisk Doppler signal   Extremities: Left groin incision with minimal serosanguineous drainage on dressing, skin edges still well approximated; proximalmost vein harvest incision with dehiscence and fat necrosis with odor and yellow drainage; dopplerable bypass graft in this area however unable to see bypass graft; all other incisions healing well  Musculoskeletal: no muscle wasting or atrophy  Neurologic: A&O X 3;  No focal weakness or paresthesias are detected Psychiatric:  The pt has Normal affect.    Medical Decision Making    [name] is a 68 y.o. male who presents with nonhealing surgical wound status post left common femoral artery endarterectomy with femoral to below the knee popliteal bypass with  vein 3 weeks postop   -Left foot seems well perfused with dopplerable PT signal -Left foot wound stable compared to preop per patient -Proximalmost vein harvest incision is now dehisced with yellow drainage and an odor and fat necrosis; concern is for exposed bypass graft running superficial in this area however I am unable to visualize this on exam today -Patient was also examined by Dr. Donzetta Matters who recommended exploration with debridement in the operating room on Friday, 04/29/2020.  He has an appointment with his PCP Friday morning but is willing to proceed with surgery in the afternoon -Petra Kuba of the procedure including risks were discussed with the patient and his wife and they agreed to proceed -He will also be prescribed pain medication to bridge him to surgery  Dagoberto Ligas PA-C [facility] Office:  [facility] MD: Donzetta Matters

## 2020-04-27 NOTE — Telephone Encounter (Signed)
Appointment scheduled at [facility] on Tuesday 05/10/2020 at 10am with a 9:30am arrival. Advised patient to contact office with any questions or concerns.

## 2020-04-27 NOTE — Telephone Encounter (Signed)
-----   Message from Sheffield Slider sent at 04/26/2020  1:31 PM EDT ----- Regarding: RE: prior auth No auth required  ----- Message ----- From: Harold Hedge, CMA Sent: 04/26/2020   8:45 AM EDT To: Sheffield Slider Subject: prior [name],   Please get MRV approved for patient.   Thanks,  [employer]

## 2020-04-27 NOTE — Addendum Note (Signed)
Addended byDagoberto Ligas on: 04/27/2020 03:45 PM   Modules accepted: Orders

## 2020-04-27 NOTE — Progress Notes (Addendum)
Established Previous Bypass   History of Present Illness   [name] is a 69 y.o. (04-10-1951) male who presents with chief complaint: non healing surgical wound with drainage and odor.  He is 3 weeks status post left common femoral endarterectomy and femoral to below the knee popliteal bypass with vein by Dr. [name] on 04/04/2020.  This was done due to a nonhealing left foot wound.  He returns to clinic today due to drainage and a odor coming from his proximalmost vein harvest incision.  He also reports some drainage from his left groin incision however skin is well approximated here.  He started receiving IV antibiotics during HD treatment yesterday.  He denies any fevers, chills, nausea/vomiting.  He also has a right above-the-knee amputation.   Current Outpatient Medications  Medication Sig Dispense Refill  . albuterol (VENTOLIN HFA) 108 (90 Base) MCG/ACT inhaler Inhale 2 puffs into the lungs every 6 (six) hours as needed for wheezing or shortness of breath.     . ARTIFICIAL TEAR OP Place 1 drop into both eyes every 6 (six) hours as needed (dry eyes).    Marland Kitchen atorvastatin (LIPITOR) 80 MG tablet Take 1 tablet (80 mg total) by mouth at bedtime. (Patient taking differently: Take 80 mg by mouth daily. ) 30 tablet 5  . bismuth subsalicylate (PEPTO BISMOL) 262 MG/15ML suspension Take 30 mLs by mouth every 6 (six) hours as needed for indigestion.    . calcium acetate (PHOSLO) 667 MG capsule Take 4 capsules (2,668 mg total) by mouth 3 (three) times daily with meals. (Patient taking differently: Take 1,334-2,001 mg by mouth See admin instructions. Take 2001 mg with meals and 1334 with each snack) 360 capsule 0  . cinacalcet (SENSIPAR) 30 MG tablet Take 1 tablet (30 mg total) by mouth every evening. 60 tablet 0  . clopidogrel (PLAVIX) 75 MG tablet Take 1 tablet (75 mg total) by mouth daily. 30 tablet 0  . diphenhydrAMINE (BENADRYL) 25 MG tablet Take 25 mg by mouth daily as needed for itching.    .  DULoxetine (CYMBALTA) 30 MG capsule Take 30 mg by mouth daily.    Marland Kitchen ibuprofen (ADVIL) 200 MG tablet Take 400 mg by mouth 3 (three) times daily.     Marland Kitchen levothyroxine (SYNTHROID) 125 MCG tablet Take 1 tablet (125 mcg total) by mouth daily before breakfast. (Patient taking differently: Take 125 mcg by mouth at bedtime. ) 30 tablet 0  . lisinopril (ZESTRIL) 10 MG tablet Take 10 mg by mouth 4 (four) times a week. Take 10 mg daily on non dialysis days (Sun, Mon, Wed, and Fri)    . Melatonin 10 MG CAPS Take 10 mg by mouth at bedtime.    . midodrine (PROAMATINE) 10 MG tablet Take 10 mg by mouth Every Tuesday,Thursday,and Saturday with dialysis.    Marland Kitchen multivitamin (RENA-VIT) TABS tablet Take 1 tablet by mouth daily.    . polyethylene glycol (MIRALAX / GLYCOLAX) 17 g packet Take 17 g by mouth daily as needed for moderate constipation.    . pregabalin (LYRICA) 150 MG capsule Take 1 capsule (150 mg total) by mouth 3 (three) times daily. 90 capsule 0  . sevelamer carbonate (RENVELA) 800 MG tablet Take 1,600-3,200 mg by mouth See admin instructions. Take 3200 mg with each meal and 1600 mg with each snack    . TOUJEO SOLOSTAR 300 UNIT/ML SOPN Inject 54-78 Units as directed at bedtime as needed (if blood sugar is 130 or higher).  Current Facility-Administered Medications  Medication Dose Route Frequency Provider Last Rate Last Admin  . 0.9 %  sodium chloride infusion  250 mL Intravenous PRN [name], MD        REVIEW OF SYSTEMS (negative unless checked):   Cardiac:  []  Chest pain or chest pressure? []  Shortness of breath upon activity? []  Shortness of breath when lying flat? []  Irregular heart rhythm?  Vascular:  []  Pain in calf, thigh, or hip brought on by walking? []  Pain in feet at night that wakes you up from your sleep? []  Blood clot in your veins? []  Leg swelling?  Pulmonary:  []  Oxygen at home? []  Productive cough? []  Wheezing?  Neurologic:  []  Sudden weakness in arms or  legs? []  Sudden numbness in arms or legs? []  Sudden onset of difficult speaking or slurred speech? []  Temporary loss of vision in one eye? []  Problems with dizziness?  Gastrointestinal:  []  Blood in stool? []  Vomited blood?  Genitourinary:  []  Burning when urinating? []  Blood in urine?  Psychiatric:  []  Major depression  Hematologic:  []  Bleeding problems? []  Problems with blood clotting?  Dermatologic:  []  Rashes or ulcers?  Constitutional:  []  Fever or chills?  Ear/Nose/Throat:  []  Change in hearing? []  Nose bleeds? []  Sore throat?  Musculoskeletal:  []  Back pain? []  Joint pain? []  Muscle pain?   Physical Examination   Vitals:   04/27/20 0917  BP: 126/63  Pulse: 64  Resp: 20  Temp: 98.2 F (36.8 C)  TempSrc: Temporal  SpO2: 99%  Weight: 243 lb 6.2 oz (110.4 kg)  Height: 6\' 4"  (1.93 m)   Body mass index is 29.63 kg/m.  General:  WDWN in NAD; vital signs documented above Gait: Not observed HENT: WNL, normocephalic Pulmonary: normal non-labored breathing , without Rales, rhonchi,  wheezing Cardiac: regular HR Abdomen: soft, NT, no masses Skin: without rashes Vascular Exam/Pulses:  Right Left  DP  amputation absent  PT  amputation  brisk Doppler signal   Extremities: Left groin incision with minimal serosanguineous drainage on dressing, skin edges still well approximated; proximalmost vein harvest incision with dehiscence and fat necrosis with odor and yellow drainage; dopplerable bypass graft in this area however unable to see bypass graft; all other incisions healing well  Musculoskeletal: no muscle wasting or atrophy  Neurologic: A&O X 3;  No focal weakness or paresthesias are detected Psychiatric:  The pt has Normal affect.    Medical Decision Making    [name] is a 69 y.o. male who presents with nonhealing surgical wound status post left common femoral artery endarterectomy with femoral to below the knee popliteal bypass with  vein 3 weeks postop   -Left foot seems well perfused with dopplerable PT signal -Left foot wound stable compared to preop per patient -Proximalmost vein harvest incision is now dehisced with yellow drainage and an odor and fat necrosis; concern is for exposed bypass graft running superficial in this area however I am unable to visualize this on exam today -Patient was also examined by Dr. Donzetta Matters who recommended exploration with debridement in the operating room on Friday, 04/29/2020.  He has an appointment with his PCP Friday morning but is willing to proceed with surgery in the afternoon -Petra Kuba of the procedure including risks were discussed with the patient and his wife and they agreed to proceed -He will also be prescribed pain medication to bridge him to surgery  Dagoberto Ligas PA-C [facility] Office:  [facility] MD: Donzetta Matters

## 2020-04-28 ENCOUNTER — Encounter (HOSPITAL_COMMUNITY): Payer: Self-pay | Admitting: Vascular Surgery

## 2020-04-28 DIAGNOSIS — D631 Anemia in chronic kidney disease: Secondary | ICD-10-CM | POA: Diagnosis not present

## 2020-04-28 DIAGNOSIS — N186 End stage renal disease: Secondary | ICD-10-CM | POA: Diagnosis not present

## 2020-04-28 DIAGNOSIS — Z992 Dependence on renal dialysis: Secondary | ICD-10-CM | POA: Diagnosis not present

## 2020-04-28 DIAGNOSIS — S81802D Unspecified open wound, left lower leg, subsequent encounter: Secondary | ICD-10-CM | POA: Diagnosis not present

## 2020-04-28 DIAGNOSIS — N2581 Secondary hyperparathyroidism of renal origin: Secondary | ICD-10-CM | POA: Diagnosis not present

## 2020-04-28 DIAGNOSIS — D509 Iron deficiency anemia, unspecified: Secondary | ICD-10-CM | POA: Diagnosis not present

## 2020-04-28 DIAGNOSIS — L97521 Non-pressure chronic ulcer of other part of left foot limited to breakdown of skin: Secondary | ICD-10-CM | POA: Diagnosis not present

## 2020-04-28 DIAGNOSIS — Z4801 Encounter for change or removal of surgical wound dressing: Secondary | ICD-10-CM | POA: Diagnosis not present

## 2020-04-28 DIAGNOSIS — I120 Hypertensive chronic kidney disease with stage 5 chronic kidney disease or end stage renal disease: Secondary | ICD-10-CM | POA: Diagnosis not present

## 2020-04-28 DIAGNOSIS — E11621 Type 2 diabetes mellitus with foot ulcer: Secondary | ICD-10-CM | POA: Diagnosis not present

## 2020-04-28 DIAGNOSIS — L03116 Cellulitis of left lower limb: Secondary | ICD-10-CM | POA: Diagnosis not present

## 2020-04-28 DIAGNOSIS — Z48812 Encounter for surgical aftercare following surgery on the circulatory system: Secondary | ICD-10-CM | POA: Diagnosis not present

## 2020-04-28 NOTE — Anesthesia Preprocedure Evaluation (Addendum)
Anesthesia Evaluation  Patient identified by MRN, date of birth, ID band Patient awake    Reviewed: Allergy & Precautions, NPO status , Patient's Chart, lab work & pertinent test results  Airway Mallampati: II  TM Distance: >3 FB Neck ROM: Full    Dental no notable dental hx. (+) Edentulous Upper, Edentulous Lower   Pulmonary shortness of breath, COPD,  COPD inhaler, Current SmokerPatient did not abstain from smoking.,    Pulmonary exam normal breath sounds clear to auscultation       Cardiovascular hypertension, Pt. on medications + CAD and + Peripheral Vascular Disease  Normal cardiovascular exam Rhythm:Regular Rate:Normal  04/01/20 Myo View  Impression Myocardial perfusion is abnormal.  Findings consistent with prior myocardial infarction.  This is an intermediate risk study.  Overall left ventricular systolic function was abnormal.    LV cavity size is mildly enlarged.  Nuclear stress EF:  39%.  The left ventricular ejection fraction is moderately decreased (30-44%).   There is no prior study for comparison.   EKG RBB   Neuro/Psych  Headaches, Anxiety Depression    GI/Hepatic Neg liver ROS, hiatal hernia, GERD  ,  Endo/Other  diabetes, Type 2, Insulin DependentHypothyroidism   Renal/GU Dialysis and ESRFRenal diseaseK+ 4.5 Cr 8.95  TTHS Dialysis     Musculoskeletal  (+) Arthritis ,   Abdominal   Peds  Hematology  (+) anemia , Hgb 7.9   Anesthesia Other Findings   Reproductive/Obstetrics                            Anesthesia Physical Anesthesia Plan  ASA: IV  Anesthesia Plan: General   Post-op Pain Management:    Induction: Intravenous  PONV Risk Score and Plan: 3 and Treatment may vary due to age or medical condition, Ondansetron and Dexamethasone  Airway Management Planned: Oral ETT  Additional Equipment: None  Intra-op Plan:   Post-operative Plan: Extubation in  OR  Informed Consent: I have reviewed the patients History and Physical, chart, labs and discussed the procedure including the risks, benefits and alternatives for the proposed anesthesia with the patient or authorized representative who has indicated his/her understanding and acceptance.     Dental advisory given  Plan Discussed with: Anesthesiologist and CRNA  Anesthesia Plan Comments:        Anesthesia Quick Evaluation

## 2020-04-28 NOTE — Progress Notes (Signed)
Mr. [name] denies chest pain or shortness of breath.  Patient tested negative for Covid__9/15/21 and has been in quarantine since that time, except to go to Hemodialysis. Mr. [name] has type II diabetes. Patient reports that CBGs run 56-110, fasting CBG was 56 when patient did not eat hs snack the preceding evening.  I instructed [name] to take 1/2 of Toujeo this evening if Toujeo is needed. I instructed patient to check CBG after awaking and every 2 hours until arrival  to the hospital.  I Instructed patient if CBG is less than 70 to drink 1/2 cup of a clear juice. Recheck CBG in 15 minutes then call pre- op desk at [phone] for further instructions.

## 2020-04-29 ENCOUNTER — Ambulatory Visit (HOSPITAL_COMMUNITY): Payer: Medicare Other | Admitting: Anesthesiology

## 2020-04-29 ENCOUNTER — Encounter (HOSPITAL_COMMUNITY): Payer: Self-pay | Admitting: Vascular Surgery

## 2020-04-29 ENCOUNTER — Ambulatory Visit (HOSPITAL_COMMUNITY)
Admission: RE | Admit: 2020-04-29 | Discharge: 2020-04-29 | Disposition: A | Discharge status: Home / Self Care | Payer: Medicare Other | Attending: Vascular Surgery | Admitting: Vascular Surgery

## 2020-04-29 ENCOUNTER — Other Ambulatory Visit: Payer: Self-pay

## 2020-04-29 ENCOUNTER — Encounter (HOSPITAL_COMMUNITY)
Admission: RE | Disposition: A | Discharge status: Home / Self Care | Payer: Self-pay | Source: Home / Self Care | Attending: Vascular Surgery

## 2020-04-29 DIAGNOSIS — I2510 Atherosclerotic heart disease of native coronary artery without angina pectoris: Secondary | ICD-10-CM | POA: Diagnosis not present

## 2020-04-29 DIAGNOSIS — Z89611 Acquired absence of right leg above knee: Secondary | ICD-10-CM | POA: Insufficient documentation

## 2020-04-29 DIAGNOSIS — Z992 Dependence on renal dialysis: Secondary | ICD-10-CM | POA: Insufficient documentation

## 2020-04-29 DIAGNOSIS — E1122 Type 2 diabetes mellitus with diabetic chronic kidney disease: Secondary | ICD-10-CM | POA: Insufficient documentation

## 2020-04-29 DIAGNOSIS — N186 End stage renal disease: Secondary | ICD-10-CM | POA: Diagnosis not present

## 2020-04-29 DIAGNOSIS — Z7902 Long term (current) use of antithrombotics/antiplatelets: Secondary | ICD-10-CM | POA: Insufficient documentation

## 2020-04-29 DIAGNOSIS — D62 Acute posthemorrhagic anemia: Secondary | ICD-10-CM | POA: Diagnosis not present

## 2020-04-29 DIAGNOSIS — Z791 Long term (current) use of non-steroidal anti-inflammatories (NSAID): Secondary | ICD-10-CM | POA: Diagnosis not present

## 2020-04-29 DIAGNOSIS — I898 Other specified noninfective disorders of lymphatic vessels and lymph nodes: Secondary | ICD-10-CM | POA: Diagnosis not present

## 2020-04-29 DIAGNOSIS — I120 Hypertensive chronic kidney disease with stage 5 chronic kidney disease or end stage renal disease: Secondary | ICD-10-CM | POA: Insufficient documentation

## 2020-04-29 DIAGNOSIS — T8189XA Other complications of procedures, not elsewhere classified, initial encounter: Secondary | ICD-10-CM | POA: Insufficient documentation

## 2020-04-29 DIAGNOSIS — J449 Chronic obstructive pulmonary disease, unspecified: Secondary | ICD-10-CM | POA: Insufficient documentation

## 2020-04-29 DIAGNOSIS — M1990 Unspecified osteoarthritis, unspecified site: Secondary | ICD-10-CM | POA: Diagnosis not present

## 2020-04-29 DIAGNOSIS — E782 Mixed hyperlipidemia: Secondary | ICD-10-CM | POA: Diagnosis not present

## 2020-04-29 DIAGNOSIS — K654 Sclerosing mesenteritis: Secondary | ICD-10-CM | POA: Diagnosis not present

## 2020-04-29 DIAGNOSIS — E1151 Type 2 diabetes mellitus with diabetic peripheral angiopathy without gangrene: Secondary | ICD-10-CM | POA: Insufficient documentation

## 2020-04-29 DIAGNOSIS — Z794 Long term (current) use of insulin: Secondary | ICD-10-CM | POA: Diagnosis not present

## 2020-04-29 DIAGNOSIS — F17210 Nicotine dependence, cigarettes, uncomplicated: Secondary | ICD-10-CM | POA: Insufficient documentation

## 2020-04-29 DIAGNOSIS — Z79890 Hormone replacement therapy (postmenopausal): Secondary | ICD-10-CM | POA: Insufficient documentation

## 2020-04-29 DIAGNOSIS — I9789 Other postprocedural complications and disorders of the circulatory system, not elsewhere classified: Secondary | ICD-10-CM | POA: Diagnosis not present

## 2020-04-29 DIAGNOSIS — Y838 Other surgical procedures as the cause of abnormal reaction of the patient, or of later complication, without mention of misadventure at the time of the procedure: Secondary | ICD-10-CM | POA: Diagnosis not present

## 2020-04-29 DIAGNOSIS — E039 Hypothyroidism, unspecified: Secondary | ICD-10-CM | POA: Diagnosis not present

## 2020-04-29 DIAGNOSIS — Z79899 Other long term (current) drug therapy: Secondary | ICD-10-CM | POA: Diagnosis not present

## 2020-04-29 HISTORY — DX: Post-traumatic stress disorder, unspecified: F43.10

## 2020-04-29 HISTORY — DX: Peripheral vascular disease, unspecified: I73.9

## 2020-04-29 HISTORY — PX: GROIN DEBRIDEMENT: SHX5159

## 2020-04-29 LAB — POCT I-STAT, CHEM 8
BUN: 37 mg/dL — ABNORMAL HIGH (ref 8–23)
Calcium, Ion: 1.18 mmol/L (ref 1.15–1.40)
Chloride: 99 mmol/L (ref 98–111)
Creatinine, Ser: 6.7 mg/dL — ABNORMAL HIGH (ref 0.61–1.24)
Glucose, Bld: 163 mg/dL — ABNORMAL HIGH (ref 70–99)
HCT: 26 % — ABNORMAL LOW (ref 39.0–52.0)
Hemoglobin: 8.8 g/dL — ABNORMAL LOW (ref 13.0–17.0)
Potassium: 4.2 mmol/L (ref 3.5–5.1)
Sodium: 139 mmol/L (ref 135–145)
TCO2: 28 mmol/L (ref 22–32)

## 2020-04-29 LAB — GLUCOSE, CAPILLARY
Glucose-Capillary: 144 mg/dL — ABNORMAL HIGH (ref 70–99)
Glucose-Capillary: 135 mg/dL — ABNORMAL HIGH (ref 70–99)
Glucose-Capillary: 134 mg/dL — ABNORMAL HIGH (ref 70–99)

## 2020-04-29 SURGERY — DEBRIDEMENT, INGUINAL REGION
Anesthesia: General | Laterality: Left

## 2020-04-29 MED ORDER — ROCURONIUM BROMIDE 10 MG/ML (PF) SYRINGE
PREFILLED_SYRINGE | INTRAVENOUS | Status: AC
  Filled 2020-04-29: qty 10

## 2020-04-29 MED ORDER — CEFAZOLIN SODIUM-DEXTROSE 2-4 GM/100ML-% IV SOLN
2.0000 g | INTRAVENOUS | Status: AC
  Administered 2020-04-29: 2 g via INTRAVENOUS

## 2020-04-29 MED ORDER — MIDAZOLAM HCL 2 MG/2ML IJ SOLN
INTRAMUSCULAR | Status: AC
  Filled 2020-04-29: qty 2

## 2020-04-29 MED ORDER — PHENYLEPHRINE HCL-NACL 10-0.9 MG/250ML-% IV SOLN
INTRAVENOUS | Status: DC | PRN
  Administered 2020-04-29: 100 ug/min via INTRAVENOUS

## 2020-04-29 MED ORDER — SODIUM CHLORIDE 0.9 % IV SOLN: INTRAVENOUS | Status: DC

## 2020-04-29 MED ORDER — LIDOCAINE 2% (20 MG/ML) 5 ML SYRINGE
INTRAMUSCULAR | Status: DC | PRN
  Administered 2020-04-29: 100 mg via INTRAVENOUS

## 2020-04-29 MED ORDER — CHLORHEXIDINE GLUCONATE 0.12 % MT SOLN: 15.0000 mL | Freq: Once | OROMUCOSAL | Status: AC

## 2020-04-29 MED ORDER — DEXMEDETOMIDINE (PRECEDEX) IN NS 20 MCG/5ML (4 MCG/ML) IV SYRINGE
PREFILLED_SYRINGE | INTRAVENOUS | Status: DC | PRN
  Administered 2020-04-29: 4 ug via INTRAVENOUS

## 2020-04-29 MED ORDER — ONDANSETRON HCL 4 MG/2ML IJ SOLN
INTRAMUSCULAR | Status: AC
  Filled 2020-04-29: qty 2

## 2020-04-29 MED ORDER — ACETAMINOPHEN 10 MG/ML IV SOLN
1000.0000 mg | Freq: Once | INTRAVENOUS | Status: DC | PRN
  Administered 2020-04-29: 1000 mg via INTRAVENOUS

## 2020-04-29 MED ORDER — ONDANSETRON HCL 4 MG/2ML IJ SOLN
INTRAMUSCULAR | Status: DC | PRN
  Administered 2020-04-29: 4 mg via INTRAVENOUS

## 2020-04-29 MED ORDER — FENTANYL CITRATE (PF) 250 MCG/5ML IJ SOLN
INTRAMUSCULAR | Status: AC
  Filled 2020-04-29: qty 5

## 2020-04-29 MED ORDER — OXYCODONE-ACETAMINOPHEN 5-325 MG PO TABS: 1.0000 | ORAL_TABLET | Freq: Four times a day (QID) | ORAL | 0 refills | Status: DC | PRN

## 2020-04-29 MED ORDER — MIDAZOLAM HCL 2 MG/2ML IJ SOLN
INTRAMUSCULAR | Status: DC | PRN
  Administered 2020-04-29: 2 mg via INTRAVENOUS

## 2020-04-29 MED ORDER — CHLORHEXIDINE GLUCONATE 0.12 % MT SOLN
OROMUCOSAL | Status: AC
  Administered 2020-04-29: 15 mL via OROMUCOSAL
  Filled 2020-04-29: qty 15

## 2020-04-29 MED ORDER — ONDANSETRON HCL 4 MG/2ML IJ SOLN: 4.0000 mg | Freq: Once | INTRAMUSCULAR | Status: DC | PRN

## 2020-04-29 MED ORDER — ACETAMINOPHEN 10 MG/ML IV SOLN
INTRAVENOUS | Status: AC
  Filled 2020-04-29: qty 100

## 2020-04-29 MED ORDER — CHLORHEXIDINE GLUCONATE 4 % EX LIQD: 60.0000 mL | Freq: Once | CUTANEOUS | Status: DC

## 2020-04-29 MED ORDER — FENTANYL CITRATE (PF) 250 MCG/5ML IJ SOLN
INTRAMUSCULAR | Status: DC | PRN
Start: 2020-04-29 — End: 2020-04-29
  Administered 2020-04-29: 25 ug via INTRAVENOUS

## 2020-04-29 MED ORDER — PROPOFOL 10 MG/ML IV BOLUS
INTRAVENOUS | Status: DC | PRN
  Administered 2020-04-29: 20 mg via INTRAVENOUS

## 2020-04-29 MED ORDER — 0.9 % SODIUM CHLORIDE (POUR BTL) OPTIME
TOPICAL | Status: DC | PRN
  Administered 2020-04-29: 1000 mL

## 2020-04-29 MED ORDER — DEXMEDETOMIDINE (PRECEDEX) IN NS 20 MCG/5ML (4 MCG/ML) IV SYRINGE
PREFILLED_SYRINGE | INTRAVENOUS | Status: AC
  Filled 2020-04-29: qty 5

## 2020-04-29 MED ORDER — FENTANYL CITRATE (PF) 100 MCG/2ML IJ SOLN: 25.0000 ug | INTRAMUSCULAR | Status: DC | PRN

## 2020-04-29 MED ORDER — ORAL CARE MOUTH RINSE: 15.0000 mL | Freq: Once | OROMUCOSAL | Status: AC

## 2020-04-29 MED ORDER — LIDOCAINE 2% (20 MG/ML) 5 ML SYRINGE
INTRAMUSCULAR | Status: AC
  Filled 2020-04-29: qty 5

## 2020-04-29 MED ORDER — CEFAZOLIN SODIUM-DEXTROSE 2-4 GM/100ML-% IV SOLN
INTRAVENOUS | Status: AC
  Filled 2020-04-29: qty 100

## 2020-04-29 MED ORDER — PROPOFOL 10 MG/ML IV BOLUS
INTRAVENOUS | Status: AC
  Filled 2020-04-29: qty 20

## 2020-04-29 SURGICAL SUPPLY — 28 items
CANISTER SUCT 3000ML PPV (MISCELLANEOUS) ×2 IMPLANT
COVER SURGICAL LIGHT HANDLE (MISCELLANEOUS) ×2 IMPLANT
COVER WAND RF STERILE (DRAPES) IMPLANT
DRAPE ORTHO SPLIT 77X108 STRL (DRAPES) ×4
DRAPE SURG ORHT 6 SPLT 77X108 (DRAPES) ×2 IMPLANT
DRSG EMULSION OIL 3X3 NADH (GAUZE/BANDAGES/DRESSINGS) ×2 IMPLANT
ELECT REM PT RETURN 9FT ADLT (ELECTROSURGICAL) ×2
ELECTRODE REM PT RTRN 9FT ADLT (ELECTROSURGICAL) ×1 IMPLANT
GAUZE SPONGE 4X4 12PLY STRL LF (GAUZE/BANDAGES/DRESSINGS) ×2 IMPLANT
GLOVE BIO SURGEON STRL SZ7.5 (GLOVE) ×2 IMPLANT
GOWN STRL REUS W/ TWL LRG LVL3 (GOWN DISPOSABLE) ×2 IMPLANT
GOWN STRL REUS W/ TWL XL LVL3 (GOWN DISPOSABLE) IMPLANT
GOWN STRL REUS W/TWL LRG LVL3 (GOWN DISPOSABLE) ×4
GOWN STRL REUS W/TWL XL LVL3 (GOWN DISPOSABLE)
KIT BASIN OR (CUSTOM PROCEDURE TRAY) ×2 IMPLANT
KIT TURNOVER KIT B (KITS) ×2 IMPLANT
NS IRRIG 1000ML POUR BTL (IV SOLUTION) ×2 IMPLANT
PACK GENERAL/GYN (CUSTOM PROCEDURE TRAY) ×2 IMPLANT
PACK UNIVERSAL I (CUSTOM PROCEDURE TRAY) IMPLANT
PAD ARMBOARD 7.5X6 YLW CONV (MISCELLANEOUS) ×4 IMPLANT
SUT ETHILON 2 0 PSLX (SUTURE) ×4 IMPLANT
SUT VIC AB 1 CTX 36 (SUTURE)
SUT VIC AB 1 CTX36XBRD ANBCTR (SUTURE) IMPLANT
SUT VIC AB 3-0 SH 27 (SUTURE)
SUT VIC AB 3-0 SH 27X BRD (SUTURE) IMPLANT
TOWEL GREEN STERILE (TOWEL DISPOSABLE) ×4 IMPLANT
TOWEL GREEN STERILE FF (TOWEL DISPOSABLE) ×2 IMPLANT
WATER STERILE IRR 1000ML POUR (IV SOLUTION) ×2 IMPLANT

## 2020-04-29 NOTE — Op Note (Signed)
° ° °  OPERATIVE REPORT  DATE OF SURGERY: 04/29/2020  PATIENT: [name], 69 y.o. 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  Primary closure  SURGEON:  Curt Jews, M.D.  PHYSICIAN ASSISTANT: Nurse  The assistant was needed for exposure and to expedite the case  ANESTHESIA: General  EBL: per anesthesia record  Total I/O In: 200 [I.V.:100; IV Piggyback:100] Out: -   BLOOD ADMINISTERED: none  DRAINS: none  SPECIMEN: none  COUNTS CORRECT:  YES  PATIENT DISPOSITION:  PACU - hemodynamically stable  PROCEDURE DETAILS: Patient was taken operating placed to position area of the left groin was prepped draped you sterile fashion.  Patient had a recent vein femoropopliteal bypass.  The groin incision itself was intact.  The uppermost vein harvest incision had had separation and fat necrosis at the base.  This was debrided.  There was old lymphocele formation and fat necrosis and some old hematoma in the base.  This was all debrided back to healthy tissue.  There was no evidence of frank infection.  The saphenous vein graft was not exposed but was easily palpable in the base of the wound.  The wound was closed with 2-0 nylon mattress sutures.  Sterile dressing was applied and the patient was transferred to the recovery room in stable condition   Rosetta Posner, M.D., [facility] 04/29/2020 1:55 PM

## 2020-04-29 NOTE — Interval H&P Note (Signed)
History and Physical Interval Note:  04/29/2020 12:53 PM  [name]  has presented today for surgery, with the diagnosis of non healing surgical wound.  The various methods of treatment have been discussed with the patient and family. After consideration of risks, benefits and other options for treatment, the patient has consented to  Procedure(s): EXPLORATION LEFT GROIN WITH DEBRIDEMENT (Left) as a surgical intervention.  The patient's history has been reviewed, patient examined, no change in status, stable for surgery.  I have reviewed the patient's chart and labs.  Questions were answered to the patient's satisfaction.     Curt Jews

## 2020-04-29 NOTE — Anesthesia Procedure Notes (Signed)
Procedure Name: LMA Insertion Date/Time: 04/29/2020 1:28 PM Performed by: Michele Rockers, CRNA Pre-anesthesia Checklist: Patient identified, Emergency Drugs available, Suction available and Patient being monitored Patient Re-evaluated:Patient Re-evaluated prior to induction Oxygen Delivery Method: Circle system utilized Preoxygenation: Pre-oxygenation with 100% oxygen Induction Type: IV induction Ventilation: Mask ventilation without difficulty LMA: LMA inserted LMA Size: 5.0 Number of attempts: 1 Placement Confirmation: positive ETCO2 and breath sounds checked- equal and bilateral Tube secured with: Tape Dental Injury: Teeth and Oropharynx as per pre-operative assessment

## 2020-04-29 NOTE — Transfer of Care (Signed)
Immediate Anesthesia Transfer of Care Note  Patient: [name]  Procedure(s) Performed: EXPLORATION LEFT GROIN WITH DEBRIDEMENT (Left )  Patient Location: PACU  Anesthesia Type:General  Level of Consciousness: drowsy and patient cooperative  Airway & Oxygen Therapy: Patient Spontanous Breathing and Patient connected to face mask oxygen  Post-op Assessment: Report given to RN and Post -op Vital signs reviewed and stable  Post vital signs: Reviewed and stable  Last Vitals:  Vitals Value Taken Time  BP 139/65 04/29/20 1409  Temp    Pulse    Resp 14 04/29/20 1411  SpO2    Vitals shown include unvalidated device data.  Last Pain:  Vitals:   04/29/20 1410  TempSrc:   PainSc: (P) Asleep      Patients Stated Pain Goal: 4 (97/98/92 1194)  Complications: No complications documented.

## 2020-04-29 NOTE — Anesthesia Postprocedure Evaluation (Signed)
Anesthesia Post Note  Patient: [name]  Procedure(s) Performed: EXPLORATION LEFT GROIN WITH DEBRIDEMENT (Left )     Patient location during evaluation: PACU Anesthesia Type: General Level of consciousness: awake and alert Pain management: pain level controlled Vital Signs Assessment: post-procedure vital signs reviewed and stable Respiratory status: spontaneous breathing, nonlabored ventilation, respiratory function stable and patient connected to nasal cannula oxygen Cardiovascular status: blood pressure returned to baseline and stable Postop Assessment: no apparent nausea or vomiting Anesthetic complications: no   No complications documented.  Last Vitals:  Vitals:   04/29/20 1450 04/29/20 1509  BP: 131/64 134/61  Pulse: 64 (!) 59  Resp: 13 14  Temp: 36.5 C   SpO2: 98% 98%    Last Pain:  Vitals:   04/29/20 1440  TempSrc:   PainSc: 3                  Barnet Glasgow

## 2020-04-29 NOTE — Discharge Instructions (Signed)
Incision Care, Adult An incision is a surgical cut that is made through your skin. Most incisions are closed after surgery. Your incision may be closed with stitches (sutures), staples, skin glue, or adhesive strips. You may need to return to your health care provider to have sutures or staples removed. This may occur several days to several weeks after your surgery. The incision needs to be cared for properly to prevent infection. How to care for your incision Incision care   Follow instructions from your health care provider about how to take care of your incision. Make sure you: ? Wash your hands with soap and water before you change the bandage (dressing). If soap and water are not available, use hand sanitizer. ? Change your dressing as told by your health care provider. ? Leave sutures, skin glue, or adhesive strips in place. These skin closures may need to stay in place for 2 weeks or longer. If adhesive strip edges start to loosen and curl up, you may trim the loose edges. Do not remove adhesive strips completely unless your health care provider tells you to do that.  Check your incision area every day for signs of infection. Check for: ? More redness, swelling, or pain. ? More fluid or blood. ? Warmth. ? Pus or a bad smell.  Ask your health care provider how to clean the incision. This may include: ? Using mild soap and water. ? Using a clean towel to pat the incision dry after cleaning it. ? Applying a cream or ointment. Do this only as told by your health care provider. ? Covering the incision with a clean dressing.  Ask your health care provider when you can leave the incision uncovered.  Do not take baths, swim, or use a hot tub until your health care provider approves. Ask your health care provider if you can take showers. You may only be allowed to take sponge baths for bathing. Medicines  If you were prescribed an antibiotic medicine, cream, or ointment, take or apply the  antibiotic as told by your health care provider. Do not stop taking or applying the antibiotic even if your condition improves.  Take over-the-counter and prescription medicines only as told by your health care provider. General instructions  Limit movement around your incision to improve healing. ? Avoid straining, lifting, or exercise for the first month, or for as long as told by your health care provider. ? Follow instructions from your health care provider about returning to your normal activities. ? Ask your health care provider what activities are safe.  Protect your incision from the sun when you are outside for the first 6 months, or for as long as told by your health care provider. Apply sunscreen around the scar or cover it up.  Keep all follow-up visits as told by your health care provider. This is important. Contact a health care provider if:  Your have more redness, swelling, or pain around the incision.  You have more fluid or blood coming from the incision.  Your incision feels warm to the touch.  You have pus or a bad smell coming from the incision.  You have a fever or shaking chills.  You are nauseous or you vomit.  You are dizzy.  Your sutures or staples come undone. Get help right away if:  You have a red streak coming from your incision.  Your incision bleeds through the dressing and the bleeding does not stop with gentle pressure.  The edges of   your incision open up and separate.  You have severe pain.  You have a rash.  You are confused.  You faint.  You have trouble breathing and a fast heartbeat. This information is not intended to replace advice given to you by your health care provider. Make sure you discuss any questions you have with your health care provider. Document Revised: 08/01/2018 Document Reviewed: 02/15/2016 Elsevier Patient Education  2020 Elsevier Inc.   

## 2020-04-30 ENCOUNTER — Encounter (HOSPITAL_COMMUNITY): Payer: Self-pay | Admitting: Vascular Surgery

## 2020-04-30 DIAGNOSIS — N186 End stage renal disease: Secondary | ICD-10-CM | POA: Diagnosis not present

## 2020-04-30 DIAGNOSIS — D631 Anemia in chronic kidney disease: Secondary | ICD-10-CM | POA: Diagnosis not present

## 2020-04-30 DIAGNOSIS — Z992 Dependence on renal dialysis: Secondary | ICD-10-CM | POA: Diagnosis not present

## 2020-04-30 DIAGNOSIS — D509 Iron deficiency anemia, unspecified: Secondary | ICD-10-CM | POA: Diagnosis not present

## 2020-04-30 DIAGNOSIS — N2581 Secondary hyperparathyroidism of renal origin: Secondary | ICD-10-CM | POA: Diagnosis not present

## 2020-04-30 DIAGNOSIS — L03116 Cellulitis of left lower limb: Secondary | ICD-10-CM | POA: Diagnosis not present

## 2020-05-02 DIAGNOSIS — S81802D Unspecified open wound, left lower leg, subsequent encounter: Secondary | ICD-10-CM | POA: Diagnosis not present

## 2020-05-02 DIAGNOSIS — Z48812 Encounter for surgical aftercare following surgery on the circulatory system: Secondary | ICD-10-CM | POA: Diagnosis not present

## 2020-05-02 DIAGNOSIS — E11621 Type 2 diabetes mellitus with foot ulcer: Secondary | ICD-10-CM | POA: Diagnosis not present

## 2020-05-02 DIAGNOSIS — L97521 Non-pressure chronic ulcer of other part of left foot limited to breakdown of skin: Secondary | ICD-10-CM | POA: Diagnosis not present

## 2020-05-02 DIAGNOSIS — Z4801 Encounter for change or removal of surgical wound dressing: Secondary | ICD-10-CM | POA: Diagnosis not present

## 2020-05-02 DIAGNOSIS — I120 Hypertensive chronic kidney disease with stage 5 chronic kidney disease or end stage renal disease: Secondary | ICD-10-CM | POA: Diagnosis not present

## 2020-05-02 LAB — AEROBIC/ANAEROBIC CULTURE W GRAM STAIN (SURGICAL/DEEP WOUND)

## 2020-05-03 DIAGNOSIS — N186 End stage renal disease: Secondary | ICD-10-CM | POA: Diagnosis not present

## 2020-05-03 DIAGNOSIS — D631 Anemia in chronic kidney disease: Secondary | ICD-10-CM | POA: Diagnosis not present

## 2020-05-03 DIAGNOSIS — D509 Iron deficiency anemia, unspecified: Secondary | ICD-10-CM | POA: Diagnosis not present

## 2020-05-03 DIAGNOSIS — L03116 Cellulitis of left lower limb: Secondary | ICD-10-CM | POA: Diagnosis not present

## 2020-05-03 DIAGNOSIS — N2581 Secondary hyperparathyroidism of renal origin: Secondary | ICD-10-CM | POA: Diagnosis not present

## 2020-05-03 DIAGNOSIS — Z992 Dependence on renal dialysis: Secondary | ICD-10-CM | POA: Diagnosis not present

## 2020-05-04 DIAGNOSIS — N2581 Secondary hyperparathyroidism of renal origin: Secondary | ICD-10-CM | POA: Diagnosis not present

## 2020-05-04 DIAGNOSIS — I120 Hypertensive chronic kidney disease with stage 5 chronic kidney disease or end stage renal disease: Secondary | ICD-10-CM | POA: Diagnosis not present

## 2020-05-04 DIAGNOSIS — D509 Iron deficiency anemia, unspecified: Secondary | ICD-10-CM | POA: Diagnosis not present

## 2020-05-04 DIAGNOSIS — E11621 Type 2 diabetes mellitus with foot ulcer: Secondary | ICD-10-CM | POA: Diagnosis not present

## 2020-05-04 DIAGNOSIS — N186 End stage renal disease: Secondary | ICD-10-CM | POA: Diagnosis not present

## 2020-05-04 DIAGNOSIS — Z48812 Encounter for surgical aftercare following surgery on the circulatory system: Secondary | ICD-10-CM | POA: Diagnosis not present

## 2020-05-04 DIAGNOSIS — L03116 Cellulitis of left lower limb: Secondary | ICD-10-CM | POA: Diagnosis not present

## 2020-05-04 DIAGNOSIS — Z992 Dependence on renal dialysis: Secondary | ICD-10-CM | POA: Diagnosis not present

## 2020-05-04 DIAGNOSIS — Z4801 Encounter for change or removal of surgical wound dressing: Secondary | ICD-10-CM | POA: Diagnosis not present

## 2020-05-04 DIAGNOSIS — D631 Anemia in chronic kidney disease: Secondary | ICD-10-CM | POA: Diagnosis not present

## 2020-05-04 DIAGNOSIS — S81802D Unspecified open wound, left lower leg, subsequent encounter: Secondary | ICD-10-CM | POA: Diagnosis not present

## 2020-05-04 DIAGNOSIS — L97521 Non-pressure chronic ulcer of other part of left foot limited to breakdown of skin: Secondary | ICD-10-CM | POA: Diagnosis not present

## 2020-05-05 ENCOUNTER — Other Ambulatory Visit: Payer: Self-pay

## 2020-05-05 ENCOUNTER — Ambulatory Visit (HOSPITAL_COMMUNITY)
Admission: RE | Admit: 2020-05-05 | Discharge: 2020-05-05 | Disposition: A | Discharge status: Home / Self Care | Payer: Medicare Other | Source: Ambulatory Visit | Attending: Vascular Surgery | Admitting: Vascular Surgery

## 2020-05-05 ENCOUNTER — Telehealth: Payer: Self-pay

## 2020-05-05 ENCOUNTER — Encounter: Payer: Self-pay | Admitting: Vascular Surgery

## 2020-05-05 ENCOUNTER — Inpatient Hospital Stay (HOSPITAL_COMMUNITY): Admission: RE | Admit: 2020-05-05 | Payer: Medicare Other | Source: Ambulatory Visit

## 2020-05-05 ENCOUNTER — Ambulatory Visit (INDEPENDENT_AMBULATORY_CARE_PROVIDER_SITE_OTHER): Payer: Medicare Other | Admitting: Vascular Surgery

## 2020-05-05 VITALS — BP 168/72 | HR 65 | Temp 97.2°F | Resp 20 | Ht 76.0 in

## 2020-05-05 DIAGNOSIS — I6522 Occlusion and stenosis of left carotid artery: Secondary | ICD-10-CM

## 2020-05-05 DIAGNOSIS — H4710 Unspecified papilledema: Secondary | ICD-10-CM

## 2020-05-05 DIAGNOSIS — I739 Peripheral vascular disease, unspecified: Secondary | ICD-10-CM | POA: Diagnosis not present

## 2020-05-05 NOTE — Telephone Encounter (Signed)
-----   Message from [place], DO sent at 05/05/2020  2:43 PM EDT ----- Please schedule LP with OP (see me).  Dr. [name] gave permission to go off plavix.  Call radiology to schedule today please ----- Message ----- From: [name], MD Sent: 05/05/2020   2:25 PM EDT To: Eustace Quail Tat, DO

## 2020-05-05 NOTE — Progress Notes (Signed)
Patient is a 69 year old male who returns for postoperative follow-up today.  He recently underwent left femoral endarterectomy left femoral to below-knee popliteal bypass with vein.  He subsequently required an I&D of his left upper thigh incision by Dr. Donnetta Hutching for a lymphocele   He currently has no incisional drainage.  He has nylon sutures in place in the left upper thigh.  He states that he feels like the wound in his left heel is continuing to fill in.  He has a home health nurse taking care of this.  Additionally he is here today for further evaluation of his carotid disease.  He has a known moderate left internal carotid artery stenosis of 60 to 80%.  He has no significant right internal carotid artery stenosis.  This is based on a duplex scan from June of this year.  Apparently the patient had an episode about 6 weeks ago where he had a line across his left lower visual field and this resolved after about a week.  He now has a fogginess and smoking this over the entire eye on the right side.  He has been evaluated by his ophthalmologist.  There were concerns as to whether or not his carotid disease may be involving the eye problems and he was sent here for further evaluation of this.  He does not have any symptoms of TIA amaurosis or stroke otherwise.  He is currently on Plavix and a statin.  He can stop his Plavix is necessary for any eye procedure.  Past Medical History:  Diagnosis Date  . Anemia   . Anxiety   . Arthritis   . CAD (coronary artery disease)    Myoview 8/21: EF 39, Lg inf defect c/w prior infarct (less likely artifact from RBBB), no ischemia; Intermediate Risk   . Chronic kidney disease    Dialysis T/Th/Sa  [facility]  . COPD (chronic obstructive pulmonary disease) ([place])    Pt denies  . Depression   . Diabetes mellitus without complication ([place])   . GERD (gastroesophageal reflux disease)   . H/O hiatal hernia   . Headache(784.0)    Hx: Migraines  . History of kidney  stones   . Hypertension   . Hypothyroidism   . Neuropathy   . Non-healing wound of amputation stump ([place])    right  . Numbness of toes    toes and feet  . Peripheral vascular disease ([place])   . Pneumonia    Hx: of several times - pt denies (04/24/19)  . PTSD (post-traumatic stress disorder)   . Renal insufficiency   . Shortness of breath dyspnea    with exertion  . Type 2 diabetes mellitus ([place])      Current Outpatient Medications on File Prior to Visit  Medication Sig Dispense Refill  . albuterol (VENTOLIN HFA) 108 (90 Base) MCG/ACT inhaler Inhale 2 puffs into the lungs every 6 (six) hours as needed for wheezing or shortness of breath.     . ARTIFICIAL TEAR OP Place 1 drop into both eyes every 6 (six) hours as needed (dry eyes).    Marland Kitchen atorvastatin (LIPITOR) 80 MG tablet Take 1 tablet (80 mg total) by mouth at bedtime. (Patient taking differently: Take 80 mg by mouth daily. ) 30 tablet 5  . bismuth subsalicylate (PEPTO BISMOL) 262 MG/15ML suspension Take 30 mLs by mouth every 6 (six) hours as needed for indigestion.    . calcium acetate (PHOSLO) 667 MG capsule Take 4 capsules (2,668 mg total) by  mouth 3 (three) times daily with meals. (Patient taking differently: Take 1,334-2,001 mg by mouth See admin instructions. Take 2001 mg with meals and 1334 with each snack) 360 capsule 0  . cinacalcet (SENSIPAR) 30 MG tablet Take 1 tablet (30 mg total) by mouth every evening. 60 tablet 0  . clopidogrel (PLAVIX) 75 MG tablet Take 1 tablet (75 mg total) by mouth daily. 30 tablet 0  . diphenhydrAMINE (BENADRYL) 25 MG tablet Take 25 mg by mouth daily as needed for itching.    . DULoxetine (CYMBALTA) 30 MG capsule Take 30 mg by mouth daily.    Marland Kitchen ibuprofen (ADVIL) 200 MG tablet Take 400 mg by mouth 3 (three) times daily.     Marland Kitchen levothyroxine (SYNTHROID) 125 MCG tablet Take 1 tablet (125 mcg total) by mouth daily before breakfast. (Patient taking differently: Take 125 mcg by mouth at bedtime. ) 30 tablet 0   . lisinopril (ZESTRIL) 10 MG tablet Take 10 mg by mouth 4 (four) times a week. Take 10 mg daily on non dialysis days (Sun, Mon, Wed, and Fri)    . Melatonin 10 MG CAPS Take 10 mg by mouth at bedtime.    . midodrine (PROAMATINE) 10 MG tablet Take 10 mg by mouth Every Tuesday,Thursday,and Saturday with dialysis.    Marland Kitchen multivitamin (RENA-VIT) TABS tablet Take 1 tablet by mouth daily.    Marland Kitchen oxyCODONE-acetaminophen (PERCOCET/ROXICET) 5-325 MG tablet Take 1 tablet by mouth every 6 (six) hours as needed for severe pain. 15 tablet 0  . polyethylene glycol (MIRALAX / GLYCOLAX) 17 g packet Take 17 g by mouth daily as needed for moderate constipation.    . pregabalin (LYRICA) 150 MG capsule Take 1 capsule (150 mg total) by mouth 3 (three) times daily. 90 capsule 0  . sevelamer carbonate (RENVELA) 800 MG tablet Take 1,600-3,200 mg by mouth See admin instructions. Take 3200 mg with each meal and 1600 mg with each snack    . TOUJEO SOLOSTAR 300 UNIT/ML SOPN Inject 54-78 Units as directed at bedtime as needed (if blood sugar is 130 or higher).      Current Facility-Administered Medications on File Prior to Visit  Medication Dose Route Frequency Provider Last Rate Last Admin  . 0.9 %  sodium chloride infusion  250 mL Intravenous PRN [name], MD         Physical exam:  Vitals:   05/05/20 1327  BP: (!) 168/72  Pulse: 65  Resp: 20  Temp: (!) 97.2 F (36.2 C)  SpO2: 96%  Height: 6\' 4"  (1.93 m)    HEENT.  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  Assessment: 1.  Peripheral arterial disease patent left leg bypass graft with slowly healing ulcer.  We will need to continue to observe stenosis at the proximal distal anastomosis repeat duplex and ABIs in 3 months time.  2.  Visual changes patient's main visual field changes in the right eye.  This has no significant carotid stenosis.  He certainly could be having aortic embolic phenomena occurring but no significant stenosis of the carotid on the right side to warrant any intervention.  He does have a moderate left internal carotid artery stenosis but primary symptoms are with his right eye currently.  If he develops any new symptoms in the left eye we could further evaluate the left internal carotid artery.  He has a work-up in progress currently with neurology and his eye doctor to evaluate all of his eye symptoms.  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  He can stop his Plavix for any intervention necessary by neurology or ophthalmology and then resume this post procedure.  3.  Recent nylon suture placement for I&D lymphocele left thigh He will need follow-up in 2 weeks for removal of the sutures in his left thigh.  [name], MD [facility] Office: [phone]

## 2020-05-05 NOTE — Telephone Encounter (Signed)
Order placed and called [facility] and was informed to send over the to them and they will contact the patient.   Spoke with billing and the facility will get the prior auth.   Left message on patients cell phone informing him that someone from [facility] will be contacting him to get his lumbar puncture scheduled. Advised patient to contact office with any questions or concerns.

## 2020-05-07 DIAGNOSIS — L03116 Cellulitis of left lower limb: Secondary | ICD-10-CM | POA: Diagnosis not present

## 2020-05-07 DIAGNOSIS — D509 Iron deficiency anemia, unspecified: Secondary | ICD-10-CM | POA: Diagnosis not present

## 2020-05-07 DIAGNOSIS — N2581 Secondary hyperparathyroidism of renal origin: Secondary | ICD-10-CM | POA: Diagnosis not present

## 2020-05-07 DIAGNOSIS — Z992 Dependence on renal dialysis: Secondary | ICD-10-CM | POA: Diagnosis not present

## 2020-05-07 DIAGNOSIS — N186 End stage renal disease: Secondary | ICD-10-CM | POA: Diagnosis not present

## 2020-05-07 DIAGNOSIS — D631 Anemia in chronic kidney disease: Secondary | ICD-10-CM | POA: Diagnosis not present

## 2020-05-09 DIAGNOSIS — Z4801 Encounter for change or removal of surgical wound dressing: Secondary | ICD-10-CM | POA: Diagnosis not present

## 2020-05-09 DIAGNOSIS — L97521 Non-pressure chronic ulcer of other part of left foot limited to breakdown of skin: Secondary | ICD-10-CM | POA: Diagnosis not present

## 2020-05-09 DIAGNOSIS — Z48812 Encounter for surgical aftercare following surgery on the circulatory system: Secondary | ICD-10-CM | POA: Diagnosis not present

## 2020-05-09 DIAGNOSIS — E11621 Type 2 diabetes mellitus with foot ulcer: Secondary | ICD-10-CM | POA: Diagnosis not present

## 2020-05-09 DIAGNOSIS — S81802D Unspecified open wound, left lower leg, subsequent encounter: Secondary | ICD-10-CM | POA: Diagnosis not present

## 2020-05-09 DIAGNOSIS — I120 Hypertensive chronic kidney disease with stage 5 chronic kidney disease or end stage renal disease: Secondary | ICD-10-CM | POA: Diagnosis not present

## 2020-05-10 ENCOUNTER — Other Ambulatory Visit: Payer: Self-pay | Admitting: *Deleted

## 2020-05-10 ENCOUNTER — Ambulatory Visit (HOSPITAL_COMMUNITY): Payer: Medicare Other

## 2020-05-10 DIAGNOSIS — Z794 Long term (current) use of insulin: Secondary | ICD-10-CM | POA: Diagnosis not present

## 2020-05-10 DIAGNOSIS — D509 Iron deficiency anemia, unspecified: Secondary | ICD-10-CM | POA: Diagnosis not present

## 2020-05-10 DIAGNOSIS — L03116 Cellulitis of left lower limb: Secondary | ICD-10-CM | POA: Diagnosis not present

## 2020-05-10 DIAGNOSIS — I739 Peripheral vascular disease, unspecified: Secondary | ICD-10-CM

## 2020-05-10 DIAGNOSIS — E119 Type 2 diabetes mellitus without complications: Secondary | ICD-10-CM | POA: Diagnosis not present

## 2020-05-10 DIAGNOSIS — N186 End stage renal disease: Secondary | ICD-10-CM | POA: Diagnosis not present

## 2020-05-10 DIAGNOSIS — N2581 Secondary hyperparathyroidism of renal origin: Secondary | ICD-10-CM | POA: Diagnosis not present

## 2020-05-10 DIAGNOSIS — Z992 Dependence on renal dialysis: Secondary | ICD-10-CM | POA: Diagnosis not present

## 2020-05-10 DIAGNOSIS — D631 Anemia in chronic kidney disease: Secondary | ICD-10-CM | POA: Diagnosis not present

## 2020-05-10 DIAGNOSIS — I6522 Occlusion and stenosis of left carotid artery: Secondary | ICD-10-CM

## 2020-05-11 ENCOUNTER — Ambulatory Visit (HOSPITAL_COMMUNITY)
Admission: RE | Admit: 2020-05-11 | Discharge: 2020-05-11 | Disposition: A | Discharge status: Home / Self Care | Payer: Medicare Other | Source: Ambulatory Visit | Attending: Neurology | Admitting: Neurology

## 2020-05-11 ENCOUNTER — Telehealth: Payer: Self-pay

## 2020-05-11 ENCOUNTER — Other Ambulatory Visit: Payer: Self-pay

## 2020-05-11 DIAGNOSIS — H5340 Unspecified visual field defects: Secondary | ICD-10-CM | POA: Diagnosis not present

## 2020-05-11 DIAGNOSIS — S81802D Unspecified open wound, left lower leg, subsequent encounter: Secondary | ICD-10-CM | POA: Diagnosis not present

## 2020-05-11 DIAGNOSIS — H53453 Other localized visual field defect, bilateral: Secondary | ICD-10-CM | POA: Diagnosis not present

## 2020-05-11 DIAGNOSIS — E11621 Type 2 diabetes mellitus with foot ulcer: Secondary | ICD-10-CM | POA: Diagnosis not present

## 2020-05-11 DIAGNOSIS — Z4801 Encounter for change or removal of surgical wound dressing: Secondary | ICD-10-CM | POA: Diagnosis not present

## 2020-05-11 DIAGNOSIS — Z48812 Encounter for surgical aftercare following surgery on the circulatory system: Secondary | ICD-10-CM | POA: Diagnosis not present

## 2020-05-11 DIAGNOSIS — H4710 Unspecified papilledema: Secondary | ICD-10-CM | POA: Insufficient documentation

## 2020-05-11 DIAGNOSIS — L97521 Non-pressure chronic ulcer of other part of left foot limited to breakdown of skin: Secondary | ICD-10-CM | POA: Diagnosis not present

## 2020-05-11 DIAGNOSIS — J3489 Other specified disorders of nose and nasal sinuses: Secondary | ICD-10-CM | POA: Diagnosis not present

## 2020-05-11 DIAGNOSIS — I120 Hypertensive chronic kidney disease with stage 5 chronic kidney disease or end stage renal disease: Secondary | ICD-10-CM | POA: Diagnosis not present

## 2020-05-11 NOTE — Telephone Encounter (Signed)
Received a call from Radiology tech at [facility] stating that he spoke with the radiologist and was informed that the patient can not have contrast because he is on dialysis . He was calling to make sure that the MRV that could be performed would be ok with Dr Tat because the patient is currently laying on the table.   Spoke with Dr Tat and she is agreeable with Radiologist recommendations.   Tech voiced understanding.

## 2020-05-12 ENCOUNTER — Encounter: Payer: Medicare Other | Admitting: Vascular Surgery

## 2020-05-12 DIAGNOSIS — N186 End stage renal disease: Secondary | ICD-10-CM | POA: Diagnosis not present

## 2020-05-12 DIAGNOSIS — D631 Anemia in chronic kidney disease: Secondary | ICD-10-CM | POA: Diagnosis not present

## 2020-05-12 DIAGNOSIS — Z992 Dependence on renal dialysis: Secondary | ICD-10-CM | POA: Diagnosis not present

## 2020-05-12 DIAGNOSIS — N2581 Secondary hyperparathyroidism of renal origin: Secondary | ICD-10-CM | POA: Diagnosis not present

## 2020-05-12 DIAGNOSIS — L03116 Cellulitis of left lower limb: Secondary | ICD-10-CM | POA: Diagnosis not present

## 2020-05-12 DIAGNOSIS — D509 Iron deficiency anemia, unspecified: Secondary | ICD-10-CM | POA: Diagnosis not present

## 2020-05-13 ENCOUNTER — Ambulatory Visit
Admission: RE | Admit: 2020-05-13 | Discharge: 2020-05-13 | Disposition: A | Discharge status: Home / Self Care | Payer: Medicare Other | Source: Ambulatory Visit | Attending: Neurology | Admitting: Neurology

## 2020-05-13 ENCOUNTER — Other Ambulatory Visit (HOSPITAL_COMMUNITY)
Admission: RE | Admit: 2020-05-13 | Discharge: 2020-05-13 | Disposition: A | Discharge status: Home / Self Care | Payer: Medicare Other | Source: Ambulatory Visit | Attending: Neurology | Admitting: Neurology

## 2020-05-13 ENCOUNTER — Other Ambulatory Visit: Payer: Medicare Other

## 2020-05-13 ENCOUNTER — Telehealth: Payer: Self-pay | Admitting: Neurology

## 2020-05-13 ENCOUNTER — Other Ambulatory Visit: Payer: Self-pay

## 2020-05-13 VITALS — BP 147/66 | HR 61

## 2020-05-13 DIAGNOSIS — R1310 Dysphagia, unspecified: Secondary | ICD-10-CM | POA: Diagnosis not present

## 2020-05-13 DIAGNOSIS — S81802D Unspecified open wound, left lower leg, subsequent encounter: Secondary | ICD-10-CM | POA: Diagnosis not present

## 2020-05-13 DIAGNOSIS — L03116 Cellulitis of left lower limb: Secondary | ICD-10-CM | POA: Diagnosis not present

## 2020-05-13 DIAGNOSIS — H4710 Unspecified papilledema: Secondary | ICD-10-CM

## 2020-05-13 DIAGNOSIS — N186 End stage renal disease: Secondary | ICD-10-CM | POA: Diagnosis not present

## 2020-05-13 DIAGNOSIS — I2510 Atherosclerotic heart disease of native coronary artery without angina pectoris: Secondary | ICD-10-CM | POA: Diagnosis not present

## 2020-05-13 DIAGNOSIS — J9601 Acute respiratory failure with hypoxia: Secondary | ICD-10-CM | POA: Diagnosis not present

## 2020-05-13 DIAGNOSIS — F17210 Nicotine dependence, cigarettes, uncomplicated: Secondary | ICD-10-CM | POA: Diagnosis not present

## 2020-05-13 DIAGNOSIS — E1151 Type 2 diabetes mellitus with diabetic peripheral angiopathy without gangrene: Secondary | ICD-10-CM | POA: Diagnosis not present

## 2020-05-13 DIAGNOSIS — Z89611 Acquired absence of right leg above knee: Secondary | ICD-10-CM | POA: Diagnosis not present

## 2020-05-13 DIAGNOSIS — I120 Hypertensive chronic kidney disease with stage 5 chronic kidney disease or end stage renal disease: Secondary | ICD-10-CM | POA: Diagnosis not present

## 2020-05-13 DIAGNOSIS — G43909 Migraine, unspecified, not intractable, without status migrainosus: Secondary | ICD-10-CM | POA: Diagnosis not present

## 2020-05-13 DIAGNOSIS — E785 Hyperlipidemia, unspecified: Secondary | ICD-10-CM | POA: Diagnosis not present

## 2020-05-13 DIAGNOSIS — L97521 Non-pressure chronic ulcer of other part of left foot limited to breakdown of skin: Secondary | ICD-10-CM | POA: Diagnosis not present

## 2020-05-13 DIAGNOSIS — J449 Chronic obstructive pulmonary disease, unspecified: Secondary | ICD-10-CM | POA: Diagnosis not present

## 2020-05-13 DIAGNOSIS — D509 Iron deficiency anemia, unspecified: Secondary | ICD-10-CM | POA: Diagnosis not present

## 2020-05-13 DIAGNOSIS — I160 Hypertensive urgency: Secondary | ICD-10-CM | POA: Diagnosis not present

## 2020-05-13 DIAGNOSIS — F329 Major depressive disorder, single episode, unspecified: Secondary | ICD-10-CM | POA: Diagnosis not present

## 2020-05-13 DIAGNOSIS — E039 Hypothyroidism, unspecified: Secondary | ICD-10-CM | POA: Diagnosis not present

## 2020-05-13 DIAGNOSIS — Z4801 Encounter for change or removal of surgical wound dressing: Secondary | ICD-10-CM | POA: Diagnosis not present

## 2020-05-13 DIAGNOSIS — K219 Gastro-esophageal reflux disease without esophagitis: Secondary | ICD-10-CM | POA: Diagnosis not present

## 2020-05-13 DIAGNOSIS — M1991 Primary osteoarthritis, unspecified site: Secondary | ICD-10-CM | POA: Diagnosis not present

## 2020-05-13 DIAGNOSIS — D631 Anemia in chronic kidney disease: Secondary | ICD-10-CM | POA: Diagnosis not present

## 2020-05-13 DIAGNOSIS — N2581 Secondary hyperparathyroidism of renal origin: Secondary | ICD-10-CM | POA: Diagnosis not present

## 2020-05-13 DIAGNOSIS — Z48812 Encounter for surgical aftercare following surgery on the circulatory system: Secondary | ICD-10-CM | POA: Diagnosis not present

## 2020-05-13 DIAGNOSIS — E1122 Type 2 diabetes mellitus with diabetic chronic kidney disease: Secondary | ICD-10-CM | POA: Diagnosis not present

## 2020-05-13 DIAGNOSIS — Z992 Dependence on renal dialysis: Secondary | ICD-10-CM | POA: Diagnosis not present

## 2020-05-13 DIAGNOSIS — E1142 Type 2 diabetes mellitus with diabetic polyneuropathy: Secondary | ICD-10-CM | POA: Diagnosis not present

## 2020-05-13 DIAGNOSIS — F419 Anxiety disorder, unspecified: Secondary | ICD-10-CM | POA: Diagnosis not present

## 2020-05-13 DIAGNOSIS — E11621 Type 2 diabetes mellitus with foot ulcer: Secondary | ICD-10-CM | POA: Diagnosis not present

## 2020-05-13 LAB — CYTOLOGY - NON PAP

## 2020-05-13 MED ORDER — IOPAMIDOL (ISOVUE-M 300) INJECTION 61%: 10.0000 mL | Freq: Once | INTRAMUSCULAR | Status: DC | PRN

## 2020-05-13 NOTE — Telephone Encounter (Signed)
Left message for patient to contact office.

## 2020-05-13 NOTE — Telephone Encounter (Signed)
I got patient schedule for 05-18-20 at 8:45 I spoke with patient wife

## 2020-05-13 NOTE — Telephone Encounter (Signed)
Some of LP results just returning.  OP high (280) and CSF protein 116.  Awaiting cytology but may be from DM (although its pretty high).  Could consider MRI lumbar in future as severe spinal stenosis can do that.  Regardless, re: high OP, patient cannot be on diamox b/c of dialysis so will start low dose topamax.  Tee, tell him that opening pressure (spinal fluid pressure) is high and there is chance that this can cause permanent blindness if untreated.  I'm waiting other labs but he cannot have some meds to treat because of dialysis.  He can have low dose topamax and that is what we will start with.  Warn him that this drug can increase risk of kidney stones and he has had stones in past but choices/options are limited.  Pt needs follow up with me (VV okay).  Start topamax - 25 mg q AM x 1 week, then 25 mg bid x 1 week, then 50 mg in the AM and 25 mg in PM for a week and then 50 mg bid thereafter (to be given post dialysis - write that on RX).  Please send in RX.  [facility], please get him appt in next few weeks

## 2020-05-13 NOTE — Progress Notes (Signed)
Blood withdrawn from pts Right AC to be sent off with LP lab work by [employer], Therapist, sports. 1 attempt, successful. Site clean and dry after. Pt tolerated well.

## 2020-05-13 NOTE — Discharge Instructions (Signed)
     Lumbar Puncture Discharge Instructions  1. Go home and rest quietly for the next 24 hours.  It is important to lie flat for the next 24 hours.  Get up only to go to the restroom.  You may lie in the bed or on a couch on your back, your stomach, your left side or your right side.  You may have one pillow under your head.  You may have pillows between your knees while you are on your side or under your knees while you are on your back.  2. DO NOT drive today.  Recline the seat as far back as it will go, while still wearing your seat belt, on the way home.  3. You may get up to go to the bathroom as needed.  You may sit up for 10 minutes to eat.  You may resume your normal diet and medications unless otherwise indicated.  Drink lots of extra fluids today and tomorrow.  4. The incidence of headache, nausea, or vomiting is about 5% (one in 20 patients).  If you develop a headache, lie flat and drink plenty of fluids until the headache goes away.  Caffeinated beverages may be helpful.  If you develop severe nausea and vomiting or a headache that does not go away with flat bed rest, call the physician who sent you here.   5. You may resume normal activities after your 24 hours of bed rest is over; however, do not exert yourself strongly or do any heavy lifting tomorrow.  6. Call your physician for a follow-up appointment.   7. If you have any questions  after you arrive home, please call [phone].  Discharge instructions have been explained to the patient.  The patient, or the person responsible for the patient, fully understands these instructions.  YOU MAY RESTART YOUR PLAVIX TODAY.

## 2020-05-14 DIAGNOSIS — N2581 Secondary hyperparathyroidism of renal origin: Secondary | ICD-10-CM | POA: Diagnosis not present

## 2020-05-14 DIAGNOSIS — Z992 Dependence on renal dialysis: Secondary | ICD-10-CM | POA: Diagnosis not present

## 2020-05-14 DIAGNOSIS — D631 Anemia in chronic kidney disease: Secondary | ICD-10-CM | POA: Diagnosis not present

## 2020-05-14 DIAGNOSIS — L03116 Cellulitis of left lower limb: Secondary | ICD-10-CM | POA: Diagnosis not present

## 2020-05-14 DIAGNOSIS — D509 Iron deficiency anemia, unspecified: Secondary | ICD-10-CM | POA: Diagnosis not present

## 2020-05-14 DIAGNOSIS — N186 End stage renal disease: Secondary | ICD-10-CM | POA: Diagnosis not present

## 2020-05-16 NOTE — Patient Instructions (Addendum)
1.  Start topiramate, 25 mg in the morning for 1 week, then 25 mg twice a day for 1 week, then 50 mg in the morning and 25 mg in the afternoon for 1 week and then he will take 50 mg twice per day thereafter (to be taken postdialysis). 2.  Just a reminder that topiramate increases the risk for kidney stones. 3.  We need to make a follow-up appointment with Dr. Valetta Close in 6 weeks   You have an appointment at [facility] with Dr Valetta Close on  June 27, 2020 at 10:15 am.  [place], [place] [address] Phone: [phone] If you are unable to keep the appointment please contact the office and reschedule the appointment.

## 2020-05-16 NOTE — Progress Notes (Signed)
Assessment/Plan:   1.  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  Long discussion with patient and wife.   Diamox is really not ideal in these patients.  Topamax, low-dose, is an option, but patient does have history of kidney stones and Topamax increases the risk of kidney stones.  Patient understands risks, but also has understanding of the risk of permanent visual field loss if we do nothing.  He would like to try this.  We discussed VP shunt as well and he would like to avoid this (and I agree, given high risk of infection)  -Start topamax - 25 mg q AM x 1 week, then 25 mg bid x 1 week, then 50 mg in the AM and 25 mg in PM for a week and then 50 mg bid thereafter (to be given post dialysis - write that on RX).  Pt to make sure that nephrologist knows that we have started this medication and he expressed understanding  -Patient no showed repeat visual field testing from Dr. Valetta Close.  Discussed the importance of following through with them.  I am going to have him try to be seen within 6 weeks so that I can get him started on the topamax even though that isn't much time for efficacy of the medication.    -pt understands risk of permanent visual loss, treated or not, moreso with untreated  -discussed weight loss could be of value  2.  Elevated CSF protein  -MRI/MRV brain neg  -cytology neg  -cx neg  -awaiting only oligoclonal bands  -could be possiblity that due to DM in combination with spinal stenosis.  Protein a bit high for DM alone.  Decided to hold off on further neuroimaging right now given patient still dealing with infection from recent fem-pop bypass and asymptommatic  3.  Left carotid stenosis, 60 to 79%  -Patient already sees Dr. [name] and has been referred back to vascular vein for management/consultation.  Pt aware.  Subjective    Patient seen today in follow-up for papilledema.  He had a lumbar puncture done since last visit.  Opening pressure was 280 mm of water.  MRV was done and was negative.  Protein was quite elevated in the CSF and was 116.  Cytology was negative.  CSF culture is negative.  Oligoclonal bands are pending.  Dr. Valetta Close saw the patient and intraocular pressures were normal.  Best corrected visual acuity was 20/300 on the right eye and 20/40 in the left.  There was 360 degree edema with bilateral hemorrhages in both eye.  Visual field testing was performed the day he saw him which demonstrated superior depression in the right eye and left eye demonstrated superior arcuate defect.  He was scheduled for repeat visual field testing on September 13, but I got a note from Dr. Valetta Close that pt no showed his visual field testing.    Current Outpatient Medications on File Prior to Visit  Medication Sig Dispense Refill   albuterol (VENTOLIN HFA) 108 (90 Base) MCG/ACT inhaler Inhale 2 puffs into the lungs every 6 (six) hours as needed for wheezing or shortness of breath.      ARTIFICIAL TEAR OP Place 1 drop into both eyes every 6 (six) hours as needed (dry eyes).     atorvastatin (LIPITOR) 80 MG tablet Take 1 tablet (80 mg total) by mouth at bedtime. (Patient  taking differently: Take 80 mg by mouth daily. ) 30 tablet 5   bismuth subsalicylate (PEPTO BISMOL) 262 MG/15ML suspension Take 30 mLs by mouth every 6 (six) hours as needed for indigestion.     calcium acetate (PHOSLO) 667 MG capsule Take 4 capsules (2,668 mg total) by mouth 3 (three) times daily with meals. (Patient taking differently: Take 1,334-2,001 mg by mouth See admin instructions. Take 2001 mg with meals and 1334 with each snack) 360 capsule 0   cinacalcet (SENSIPAR) 30 MG tablet Take 1 tablet (30 mg total) by mouth every evening. 60 tablet 0   clopidogrel (PLAVIX) 75 MG tablet Take 1 tablet (75 mg total) by mouth daily. 30 tablet 0   diphenhydrAMINE  (BENADRYL) 25 MG tablet Take 25 mg by mouth daily as needed for itching.     DULoxetine (CYMBALTA) 30 MG capsule Take 30 mg by mouth daily.     ibuprofen (ADVIL) 200 MG tablet Take 400 mg by mouth 3 (three) times daily.      levothyroxine (SYNTHROID) 125 MCG tablet Take 1 tablet (125 mcg total) by mouth daily before breakfast. (Patient taking differently: Take 125 mcg by mouth at bedtime. ) 30 tablet 0   lisinopril (ZESTRIL) 10 MG tablet Take 10 mg by mouth 4 (four) times a week. Take 10 mg daily on non dialysis days (Sun, Mon, Wed, and Fri)     Melatonin 10 MG CAPS Take 10 mg by mouth at bedtime.     midodrine (PROAMATINE) 10 MG tablet Take 10 mg by mouth Every Tuesday,Thursday,and Saturday with dialysis.     multivitamin (RENA-VIT) TABS tablet Take 1 tablet by mouth daily.     oxyCODONE-acetaminophen (PERCOCET/ROXICET) 5-325 MG tablet Take 1 tablet by mouth every 6 (six) hours as needed for severe pain. 15 tablet 0   polyethylene glycol (MIRALAX / GLYCOLAX) 17 g packet Take 17 g by mouth daily as needed for moderate constipation.     pregabalin (LYRICA) 150 MG capsule Take 1 capsule (150 mg total) by mouth 3 (three) times daily. 90 capsule 0   sevelamer carbonate (RENVELA) 800 MG tablet Take 1,600-3,200 mg by mouth See admin instructions. Take 3200 mg with each meal and 1600 mg with each snack     TOUJEO SOLOSTAR 300 UNIT/ML SOPN Inject 54-78 Units as directed at bedtime as needed (if blood sugar is 130 or higher).      Current Facility-Administered Medications on File Prior to Visit  Medication Dose Route Frequency Provider Last Rate Last Admin   0.9 %  sodium chloride infusion  250 mL Intravenous PRN [name], MD         Objective   There were no vitals filed for this visit. GEN:  The patient appears stated age and is in NAD.  Neurological examination:  Orientation: The patient is alert and oriented x3.  Movement examination: Tone: wnl Abnormal movements: he  has L hand and R leg (stump) tremor. Coordination:  There is no decremation with RAM's Gait and Station: in [facility]    Follow up Instructions      -I discussed the assessment and treatment plan with the patient. The patient was provided an opportunity to ask questions and all were answered. The patient agreed with the plan and demonstrated an understanding of the instructions.   The patient was advised to call back or seek an in-person evaluation if the symptoms worsen or if the condition fails to improve as anticipated.    Total time  spent on today's visit was 40 minutes, including both face-to-face time and nonface-to-face time.  Time included that spent on review of records (prior notes available to me/labs/imaging if pertinent), discussing treatment and goals, answering patient's questions and coordinating care.   Alonza Bogus, DO

## 2020-05-16 NOTE — Telephone Encounter (Signed)
Discussed Dr Doristine Devoid recommendations with patient. 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 Informed patient that this matter will be discussed at his upcoming appointment on Wednesday May 18, 2020. He voiced understanding.

## 2020-05-17 ENCOUNTER — Other Ambulatory Visit: Payer: Self-pay

## 2020-05-17 DIAGNOSIS — N186 End stage renal disease: Secondary | ICD-10-CM | POA: Diagnosis not present

## 2020-05-17 DIAGNOSIS — Z992 Dependence on renal dialysis: Secondary | ICD-10-CM | POA: Diagnosis not present

## 2020-05-17 DIAGNOSIS — D509 Iron deficiency anemia, unspecified: Secondary | ICD-10-CM | POA: Diagnosis not present

## 2020-05-17 DIAGNOSIS — N2581 Secondary hyperparathyroidism of renal origin: Secondary | ICD-10-CM | POA: Diagnosis not present

## 2020-05-17 DIAGNOSIS — D631 Anemia in chronic kidney disease: Secondary | ICD-10-CM | POA: Diagnosis not present

## 2020-05-17 DIAGNOSIS — L03116 Cellulitis of left lower limb: Secondary | ICD-10-CM | POA: Diagnosis not present

## 2020-05-17 NOTE — Patient Outreach (Signed)
[facility]) Care Management  05/17/2020  [name] Sep 18, 1950 [id number]   Telephone assessment:  Placed 4th outreach call to patient that was unsuccessful.   PLAN: case closure as unable to reach.  Tomasa Rand, RN, BSN, CEN [facility] [employer] [phone]

## 2020-05-18 ENCOUNTER — Encounter: Payer: Self-pay | Admitting: Neurology

## 2020-05-18 ENCOUNTER — Other Ambulatory Visit: Payer: Self-pay

## 2020-05-18 ENCOUNTER — Telehealth: Payer: Self-pay

## 2020-05-18 ENCOUNTER — Ambulatory Visit (INDEPENDENT_AMBULATORY_CARE_PROVIDER_SITE_OTHER): Payer: Medicare Other | Admitting: Neurology

## 2020-05-18 VITALS — BP 160/74 | HR 73 | Ht 76.0 in

## 2020-05-18 DIAGNOSIS — E1142 Type 2 diabetes mellitus with diabetic polyneuropathy: Secondary | ICD-10-CM | POA: Diagnosis not present

## 2020-05-18 DIAGNOSIS — Z992 Dependence on renal dialysis: Secondary | ICD-10-CM | POA: Diagnosis not present

## 2020-05-18 DIAGNOSIS — N186 End stage renal disease: Secondary | ICD-10-CM | POA: Diagnosis not present

## 2020-05-18 DIAGNOSIS — I6522 Occlusion and stenosis of left carotid artery: Secondary | ICD-10-CM

## 2020-05-18 DIAGNOSIS — E11621 Type 2 diabetes mellitus with foot ulcer: Secondary | ICD-10-CM | POA: Diagnosis not present

## 2020-05-18 DIAGNOSIS — G932 Benign intracranial hypertension: Secondary | ICD-10-CM | POA: Diagnosis not present

## 2020-05-18 DIAGNOSIS — I10 Essential (primary) hypertension: Secondary | ICD-10-CM | POA: Diagnosis not present

## 2020-05-18 DIAGNOSIS — I120 Hypertensive chronic kidney disease with stage 5 chronic kidney disease or end stage renal disease: Secondary | ICD-10-CM | POA: Diagnosis not present

## 2020-05-18 DIAGNOSIS — Z23 Encounter for immunization: Secondary | ICD-10-CM | POA: Diagnosis not present

## 2020-05-18 DIAGNOSIS — E1122 Type 2 diabetes mellitus with diabetic chronic kidney disease: Secondary | ICD-10-CM | POA: Diagnosis not present

## 2020-05-18 DIAGNOSIS — S81802D Unspecified open wound, left lower leg, subsequent encounter: Secondary | ICD-10-CM | POA: Diagnosis not present

## 2020-05-18 DIAGNOSIS — E782 Mixed hyperlipidemia: Secondary | ICD-10-CM | POA: Diagnosis not present

## 2020-05-18 DIAGNOSIS — L97521 Non-pressure chronic ulcer of other part of left foot limited to breakdown of skin: Secondary | ICD-10-CM | POA: Diagnosis not present

## 2020-05-18 DIAGNOSIS — Z48812 Encounter for surgical aftercare following surgery on the circulatory system: Secondary | ICD-10-CM | POA: Diagnosis not present

## 2020-05-18 DIAGNOSIS — Z4801 Encounter for change or removal of surgical wound dressing: Secondary | ICD-10-CM | POA: Diagnosis not present

## 2020-05-18 MED ORDER — TOPIRAMATE 25 MG PO TABS: 50.0000 mg | ORAL_TABLET | Freq: Two times a day (BID) | ORAL | 1 refills | Status: DC

## 2020-05-18 NOTE — Telephone Encounter (Signed)
Patient called about getting his stitches out. His surgery was 04/29/20 for a non healing wound in his groin.Nylon sutures left in place. According to the patient "it's healing pretty good" Made f/u appt on Friday to see PA and evaluate wound and potentially remove stitches.Patient satisfied with plan.

## 2020-05-19 DIAGNOSIS — D509 Iron deficiency anemia, unspecified: Secondary | ICD-10-CM | POA: Diagnosis not present

## 2020-05-19 DIAGNOSIS — N2581 Secondary hyperparathyroidism of renal origin: Secondary | ICD-10-CM | POA: Diagnosis not present

## 2020-05-19 DIAGNOSIS — Z992 Dependence on renal dialysis: Secondary | ICD-10-CM | POA: Diagnosis not present

## 2020-05-19 DIAGNOSIS — L03116 Cellulitis of left lower limb: Secondary | ICD-10-CM | POA: Diagnosis not present

## 2020-05-19 DIAGNOSIS — N186 End stage renal disease: Secondary | ICD-10-CM | POA: Diagnosis not present

## 2020-05-19 DIAGNOSIS — D631 Anemia in chronic kidney disease: Secondary | ICD-10-CM | POA: Diagnosis not present

## 2020-05-20 DIAGNOSIS — Z48812 Encounter for surgical aftercare following surgery on the circulatory system: Secondary | ICD-10-CM | POA: Diagnosis not present

## 2020-05-20 DIAGNOSIS — L97521 Non-pressure chronic ulcer of other part of left foot limited to breakdown of skin: Secondary | ICD-10-CM | POA: Diagnosis not present

## 2020-05-20 DIAGNOSIS — I120 Hypertensive chronic kidney disease with stage 5 chronic kidney disease or end stage renal disease: Secondary | ICD-10-CM | POA: Diagnosis not present

## 2020-05-20 DIAGNOSIS — E11621 Type 2 diabetes mellitus with foot ulcer: Secondary | ICD-10-CM | POA: Diagnosis not present

## 2020-05-20 DIAGNOSIS — S81802D Unspecified open wound, left lower leg, subsequent encounter: Secondary | ICD-10-CM | POA: Diagnosis not present

## 2020-05-20 DIAGNOSIS — Z4801 Encounter for change or removal of surgical wound dressing: Secondary | ICD-10-CM | POA: Diagnosis not present

## 2020-05-20 LAB — PROTEIN, CSF: Total Protein, CSF: 116 mg/dL — ABNORMAL HIGH (ref 15–60)

## 2020-05-20 LAB — CSF CELL COUNT WITH DIFFERENTIAL
RBC Count, CSF: 24 cells/uL — ABNORMAL HIGH
WBC, CSF: 2 cells/uL (ref 0–5)

## 2020-05-20 LAB — OLIGOCLONAL BANDS, CSF + SERM

## 2020-05-20 LAB — CSF CULTURE W GRAM STAIN
GRAM STAIN:: NONE SEEN
MICRO NUMBER:: 11020781
Result:: NO GROWTH
SPECIMEN QUALITY:: ADEQUATE

## 2020-05-20 LAB — GLUCOSE, CSF: Glucose, CSF: 67 mg/dL (ref 40–80)

## 2020-05-21 DIAGNOSIS — L03116 Cellulitis of left lower limb: Secondary | ICD-10-CM | POA: Diagnosis not present

## 2020-05-21 DIAGNOSIS — D631 Anemia in chronic kidney disease: Secondary | ICD-10-CM | POA: Diagnosis not present

## 2020-05-21 DIAGNOSIS — N186 End stage renal disease: Secondary | ICD-10-CM | POA: Diagnosis not present

## 2020-05-21 DIAGNOSIS — N2581 Secondary hyperparathyroidism of renal origin: Secondary | ICD-10-CM | POA: Diagnosis not present

## 2020-05-21 DIAGNOSIS — D509 Iron deficiency anemia, unspecified: Secondary | ICD-10-CM | POA: Diagnosis not present

## 2020-05-21 DIAGNOSIS — Z992 Dependence on renal dialysis: Secondary | ICD-10-CM | POA: Diagnosis not present

## 2020-05-24 DIAGNOSIS — L03116 Cellulitis of left lower limb: Secondary | ICD-10-CM | POA: Diagnosis not present

## 2020-05-24 DIAGNOSIS — D509 Iron deficiency anemia, unspecified: Secondary | ICD-10-CM | POA: Diagnosis not present

## 2020-05-24 DIAGNOSIS — N2581 Secondary hyperparathyroidism of renal origin: Secondary | ICD-10-CM | POA: Diagnosis not present

## 2020-05-24 DIAGNOSIS — N186 End stage renal disease: Secondary | ICD-10-CM | POA: Diagnosis not present

## 2020-05-24 DIAGNOSIS — D631 Anemia in chronic kidney disease: Secondary | ICD-10-CM | POA: Diagnosis not present

## 2020-05-24 DIAGNOSIS — Z992 Dependence on renal dialysis: Secondary | ICD-10-CM | POA: Diagnosis not present

## 2020-05-25 DIAGNOSIS — Z48812 Encounter for surgical aftercare following surgery on the circulatory system: Secondary | ICD-10-CM | POA: Diagnosis not present

## 2020-05-25 DIAGNOSIS — S81802D Unspecified open wound, left lower leg, subsequent encounter: Secondary | ICD-10-CM | POA: Diagnosis not present

## 2020-05-25 DIAGNOSIS — L97521 Non-pressure chronic ulcer of other part of left foot limited to breakdown of skin: Secondary | ICD-10-CM | POA: Diagnosis not present

## 2020-05-25 DIAGNOSIS — Z4801 Encounter for change or removal of surgical wound dressing: Secondary | ICD-10-CM | POA: Diagnosis not present

## 2020-05-25 DIAGNOSIS — I120 Hypertensive chronic kidney disease with stage 5 chronic kidney disease or end stage renal disease: Secondary | ICD-10-CM | POA: Diagnosis not present

## 2020-05-25 DIAGNOSIS — E11621 Type 2 diabetes mellitus with foot ulcer: Secondary | ICD-10-CM | POA: Diagnosis not present

## 2020-05-26 DIAGNOSIS — N186 End stage renal disease: Secondary | ICD-10-CM | POA: Diagnosis not present

## 2020-05-26 DIAGNOSIS — N2581 Secondary hyperparathyroidism of renal origin: Secondary | ICD-10-CM | POA: Diagnosis not present

## 2020-05-26 DIAGNOSIS — L03116 Cellulitis of left lower limb: Secondary | ICD-10-CM | POA: Diagnosis not present

## 2020-05-26 DIAGNOSIS — D509 Iron deficiency anemia, unspecified: Secondary | ICD-10-CM | POA: Diagnosis not present

## 2020-05-26 DIAGNOSIS — D631 Anemia in chronic kidney disease: Secondary | ICD-10-CM | POA: Diagnosis not present

## 2020-05-26 DIAGNOSIS — Z992 Dependence on renal dialysis: Secondary | ICD-10-CM | POA: Diagnosis not present

## 2020-05-28 DIAGNOSIS — N2581 Secondary hyperparathyroidism of renal origin: Secondary | ICD-10-CM | POA: Diagnosis not present

## 2020-05-28 DIAGNOSIS — D509 Iron deficiency anemia, unspecified: Secondary | ICD-10-CM | POA: Diagnosis not present

## 2020-05-28 DIAGNOSIS — D631 Anemia in chronic kidney disease: Secondary | ICD-10-CM | POA: Diagnosis not present

## 2020-05-28 DIAGNOSIS — L03116 Cellulitis of left lower limb: Secondary | ICD-10-CM | POA: Diagnosis not present

## 2020-05-28 DIAGNOSIS — Z992 Dependence on renal dialysis: Secondary | ICD-10-CM | POA: Diagnosis not present

## 2020-05-28 DIAGNOSIS — N186 End stage renal disease: Secondary | ICD-10-CM | POA: Diagnosis not present

## 2020-05-31 DIAGNOSIS — D509 Iron deficiency anemia, unspecified: Secondary | ICD-10-CM | POA: Diagnosis not present

## 2020-05-31 DIAGNOSIS — L03116 Cellulitis of left lower limb: Secondary | ICD-10-CM | POA: Diagnosis not present

## 2020-05-31 DIAGNOSIS — N2581 Secondary hyperparathyroidism of renal origin: Secondary | ICD-10-CM | POA: Diagnosis not present

## 2020-05-31 DIAGNOSIS — N186 End stage renal disease: Secondary | ICD-10-CM | POA: Diagnosis not present

## 2020-05-31 DIAGNOSIS — D631 Anemia in chronic kidney disease: Secondary | ICD-10-CM | POA: Diagnosis not present

## 2020-05-31 DIAGNOSIS — Z992 Dependence on renal dialysis: Secondary | ICD-10-CM | POA: Diagnosis not present

## 2020-06-01 DIAGNOSIS — S81802D Unspecified open wound, left lower leg, subsequent encounter: Secondary | ICD-10-CM | POA: Diagnosis not present

## 2020-06-01 DIAGNOSIS — I120 Hypertensive chronic kidney disease with stage 5 chronic kidney disease or end stage renal disease: Secondary | ICD-10-CM | POA: Diagnosis not present

## 2020-06-01 DIAGNOSIS — E11621 Type 2 diabetes mellitus with foot ulcer: Secondary | ICD-10-CM | POA: Diagnosis not present

## 2020-06-01 DIAGNOSIS — Z4801 Encounter for change or removal of surgical wound dressing: Secondary | ICD-10-CM | POA: Diagnosis not present

## 2020-06-01 DIAGNOSIS — Z48812 Encounter for surgical aftercare following surgery on the circulatory system: Secondary | ICD-10-CM | POA: Diagnosis not present

## 2020-06-01 DIAGNOSIS — L97521 Non-pressure chronic ulcer of other part of left foot limited to breakdown of skin: Secondary | ICD-10-CM | POA: Diagnosis not present

## 2020-06-02 DIAGNOSIS — D631 Anemia in chronic kidney disease: Secondary | ICD-10-CM | POA: Diagnosis not present

## 2020-06-02 DIAGNOSIS — Z992 Dependence on renal dialysis: Secondary | ICD-10-CM | POA: Diagnosis not present

## 2020-06-02 DIAGNOSIS — N2581 Secondary hyperparathyroidism of renal origin: Secondary | ICD-10-CM | POA: Diagnosis not present

## 2020-06-02 DIAGNOSIS — N186 End stage renal disease: Secondary | ICD-10-CM | POA: Diagnosis not present

## 2020-06-02 DIAGNOSIS — L03116 Cellulitis of left lower limb: Secondary | ICD-10-CM | POA: Diagnosis not present

## 2020-06-02 DIAGNOSIS — D509 Iron deficiency anemia, unspecified: Secondary | ICD-10-CM | POA: Diagnosis not present

## 2020-06-03 ENCOUNTER — Other Ambulatory Visit: Payer: Self-pay

## 2020-06-03 ENCOUNTER — Ambulatory Visit (INDEPENDENT_AMBULATORY_CARE_PROVIDER_SITE_OTHER): Payer: Medicare Other | Admitting: Physician Assistant

## 2020-06-03 VITALS — BP 147/73 | HR 61 | Temp 97.9°F | Resp 20 | Ht 76.0 in | Wt 243.4 lb

## 2020-06-03 DIAGNOSIS — Z992 Dependence on renal dialysis: Secondary | ICD-10-CM | POA: Diagnosis not present

## 2020-06-03 DIAGNOSIS — L03116 Cellulitis of left lower limb: Secondary | ICD-10-CM | POA: Diagnosis not present

## 2020-06-03 DIAGNOSIS — D509 Iron deficiency anemia, unspecified: Secondary | ICD-10-CM | POA: Diagnosis not present

## 2020-06-03 DIAGNOSIS — T8189XA Other complications of procedures, not elsewhere classified, initial encounter: Secondary | ICD-10-CM

## 2020-06-03 DIAGNOSIS — I739 Peripheral vascular disease, unspecified: Secondary | ICD-10-CM

## 2020-06-03 DIAGNOSIS — N186 End stage renal disease: Secondary | ICD-10-CM | POA: Diagnosis not present

## 2020-06-03 DIAGNOSIS — I6522 Occlusion and stenosis of left carotid artery: Secondary | ICD-10-CM

## 2020-06-03 DIAGNOSIS — N2581 Secondary hyperparathyroidism of renal origin: Secondary | ICD-10-CM | POA: Diagnosis not present

## 2020-06-03 DIAGNOSIS — D631 Anemia in chronic kidney disease: Secondary | ICD-10-CM | POA: Diagnosis not present

## 2020-06-03 NOTE — Progress Notes (Signed)
POST OPERATIVE OFFICE NOTE    CC:  F/u for surgery  HPI:  This is a 69 y.o. male who is s/p Debridement of left vein harvest incision by Dr. 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 Patient had previously undergone left common femoral endarterectomy and left femoral to below knee popliteal bypass with non reversed left greater saphenous vein by Dr. [name] on 04/04/20 due to non healing left foot wound. He was seen in follow up by Dr. [name] on 05/05/20 and the incision was doing well without drainage. Additionally his left heel wound was improving.  He presents today for follow up and suture removal following debridement of his left vein harvest site in left groin. This is healing well. He says that it has not drained at all. Denies any redness, pain, swelling, warmth, fever or chills. He does still have a very small wound on left heel. He is continuing to have [facility] assist in wound care of his left heel. He otherwise denies any pain in left leg or foot.  He does have 60-80% Left ICA stenosis which is being monitored. He reports no new symptoms since his last visit. He was having some visual fields changes in right eye and these have not changed. Continues to have blurred vision in right eye. No left eye visual changes. No amaurosis fugax, no slurred speech, no facial drooping or weakness of upper or lower extremities  Allergies  Allergen Reactions  . Ace Inhibitors     Unknown reaction, tolerates lisinopril   . Clonidine     Unknown reaction   . Morphine And Related Other (See Comments)    Not effective  . Oxytocin     unknown    Current Outpatient Medications  Medication Sig Dispense Refill  . albuterol (VENTOLIN HFA) 108 (90 Base) MCG/ACT inhaler Inhale 2 puffs into the lungs every 6 (six) hours as needed for wheezing or shortness of breath.     . ALPRAZolam (XANAX) 0.25 MG tablet Take 0.25 mg by mouth 2 (two) times daily as needed.    .  ARTIFICIAL TEAR OP Place 1 drop into both eyes every 6 (six) hours as needed (dry eyes).    Marland Kitchen atorvastatin (LIPITOR) 80 MG tablet Take 1 tablet (80 mg total) by mouth at bedtime. (Patient taking differently: Take 80 mg by mouth daily. ) 30 tablet 5  . bismuth subsalicylate (PEPTO BISMOL) 262 MG/15ML suspension Take 30 mLs by mouth every 6 (six) hours as needed for indigestion.    . calcium acetate (PHOSLO) 667 MG capsule Take 4 capsules (2,668 mg total) by mouth 3 (three) times daily with meals. (Patient taking differently: Take 1,334-2,001 mg by mouth See admin instructions. Take 2001 mg with meals and 1334 with each snack) 360 capsule 0  . cinacalcet (SENSIPAR) 30 MG tablet Take 1 tablet (30 mg total) by mouth every evening. 60 tablet 0  . clopidogrel (PLAVIX) 75 MG tablet Take 1 tablet (75 mg total) by mouth daily. 30 tablet 0  . cyclobenzaprine (FLEXERIL) 10 MG tablet Take 10 mg by mouth 3 (three) times daily as needed.    . diphenhydrAMINE (BENADRYL) 25 MG tablet Take 25 mg by mouth daily as needed for itching.    . DULoxetine (CYMBALTA) 30 MG capsule Take 30 mg by mouth daily.    Marland Kitchen GLOBAL EASE INJECT PEN NEEDLES 32G X 4 MM MISC TO BE USED DAILY WITH INSULIN    . ibuprofen (  ADVIL) 200 MG tablet Take 400 mg by mouth 3 (three) times daily.     Marland Kitchen levothyroxine (SYNTHROID) 125 MCG tablet Take 1 tablet (125 mcg total) by mouth daily before breakfast. (Patient taking differently: Take 125 mcg by mouth at bedtime. ) 30 tablet 0  . lisinopril (ZESTRIL) 10 MG tablet Take 10 mg by mouth 4 (four) times a week. Take 10 mg daily on non dialysis days (Sun, Mon, Wed, and Fri)    . Melatonin 10 MG CAPS Take 10 mg by mouth at bedtime.    . midodrine (PROAMATINE) 10 MG tablet Take 10 mg by mouth Every Tuesday,Thursday,and Saturday with dialysis.    Marland Kitchen multivitamin (RENA-VIT) TABS tablet Take 1 tablet by mouth daily.    . polyethylene glycol (MIRALAX / GLYCOLAX) 17 g packet Take 17 g by mouth daily as needed for  moderate constipation.    . pregabalin (LYRICA) 150 MG capsule Take 1 capsule (150 mg total) by mouth 3 (three) times daily. 90 capsule 0  . sevelamer carbonate (RENVELA) 800 MG tablet Take 1,600-3,200 mg by mouth See admin instructions. Take 3200 mg with each meal and 1600 mg with each snack    . topiramate (TOPAMAX) 25 MG tablet Take 2 tablets (50 mg total) by mouth 2 (two) times daily. To be given post dialysis, titrated as directed to patient 360 tablet 1  . TOUJEO SOLOSTAR 300 UNIT/ML SOPN Inject 54-78 Units as directed at bedtime as needed (if blood sugar is 130 or higher).      Current Facility-Administered Medications  Medication Dose Route Frequency Provider Last Rate Last Admin  . 0.9 %  sodium chloride infusion  250 mL Intravenous PRN [name], MD         ROS:  See HPI  Physical Exam:  Vitals:   06/03/20 1342  BP: (!) 147/73  Pulse: 61  Resp: 20  Temp: 97.9 F (36.6 C)  TempSrc: Temporal  SpO2: 97%  Weight: 243 lb 6.2 oz (110.4 kg)  Height: 6\' 4"  (1.93 m)   General: well appearing, well nourished, in no distress Incision:  Left groin saphenectomy site essentially healed. Nylon sutures removed. No erythema. No drainage on compression. Left lower extremity incisions all healed Extremities: 2+ left femoral pulse, left lower extremity well perfused and warm. 1.5 cm x 1 cm ulcer on left heel. Very superficial. No surrounding erythema Neuro: alert and oriented  Assessment/Plan:  This is a 69 y.o. male who is s/p Debridement of left vein harvest incision by Dr. 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 Patient had previously undergone left common femoral endarterectomy and left femoral to below knee popliteal bypass with non reversed left greater saphenous vein by Dr. [name] on 04/04/20 due to non healing left foot wound.  Left groin essentially healed. Sutures removed today. Advised patient to continue to keep area clean and dry.  Observe for signs of infection. Left heel is almost healed as well. Continue HH wound care. Refrain from prolonged pressure on left heel. Left leg otherwise well perfused and warm. - No new neurological symptoms since last visit. Continue follow up with Opthalmology - continue Aspirin, Statin and Plavix -He will keep his scheduled 3 month follow up appointment with Dr. [name] for 08/11/20   Karoline Caldwell, PA-C [facility] [phone]  Clinic MD: Dr. Donzetta Matters

## 2020-06-04 DIAGNOSIS — D509 Iron deficiency anemia, unspecified: Secondary | ICD-10-CM | POA: Diagnosis not present

## 2020-06-04 DIAGNOSIS — N2581 Secondary hyperparathyroidism of renal origin: Secondary | ICD-10-CM | POA: Diagnosis not present

## 2020-06-04 DIAGNOSIS — Z992 Dependence on renal dialysis: Secondary | ICD-10-CM | POA: Diagnosis not present

## 2020-06-04 DIAGNOSIS — L03116 Cellulitis of left lower limb: Secondary | ICD-10-CM | POA: Diagnosis not present

## 2020-06-04 DIAGNOSIS — D631 Anemia in chronic kidney disease: Secondary | ICD-10-CM | POA: Diagnosis not present

## 2020-06-04 DIAGNOSIS — N186 End stage renal disease: Secondary | ICD-10-CM | POA: Diagnosis not present

## 2020-06-07 DIAGNOSIS — L03116 Cellulitis of left lower limb: Secondary | ICD-10-CM | POA: Diagnosis not present

## 2020-06-07 DIAGNOSIS — D631 Anemia in chronic kidney disease: Secondary | ICD-10-CM | POA: Diagnosis not present

## 2020-06-07 DIAGNOSIS — N2581 Secondary hyperparathyroidism of renal origin: Secondary | ICD-10-CM | POA: Diagnosis not present

## 2020-06-07 DIAGNOSIS — D509 Iron deficiency anemia, unspecified: Secondary | ICD-10-CM | POA: Diagnosis not present

## 2020-06-07 DIAGNOSIS — N186 End stage renal disease: Secondary | ICD-10-CM | POA: Diagnosis not present

## 2020-06-07 DIAGNOSIS — Z992 Dependence on renal dialysis: Secondary | ICD-10-CM | POA: Diagnosis not present

## 2020-06-08 DIAGNOSIS — E11621 Type 2 diabetes mellitus with foot ulcer: Secondary | ICD-10-CM | POA: Diagnosis not present

## 2020-06-08 DIAGNOSIS — S81802D Unspecified open wound, left lower leg, subsequent encounter: Secondary | ICD-10-CM | POA: Diagnosis not present

## 2020-06-08 DIAGNOSIS — I120 Hypertensive chronic kidney disease with stage 5 chronic kidney disease or end stage renal disease: Secondary | ICD-10-CM | POA: Diagnosis not present

## 2020-06-08 DIAGNOSIS — Z4801 Encounter for change or removal of surgical wound dressing: Secondary | ICD-10-CM | POA: Diagnosis not present

## 2020-06-08 DIAGNOSIS — Z48812 Encounter for surgical aftercare following surgery on the circulatory system: Secondary | ICD-10-CM | POA: Diagnosis not present

## 2020-06-08 DIAGNOSIS — L97521 Non-pressure chronic ulcer of other part of left foot limited to breakdown of skin: Secondary | ICD-10-CM | POA: Diagnosis not present

## 2020-06-09 DIAGNOSIS — Z992 Dependence on renal dialysis: Secondary | ICD-10-CM | POA: Diagnosis not present

## 2020-06-09 DIAGNOSIS — L03116 Cellulitis of left lower limb: Secondary | ICD-10-CM | POA: Diagnosis not present

## 2020-06-09 DIAGNOSIS — D509 Iron deficiency anemia, unspecified: Secondary | ICD-10-CM | POA: Diagnosis not present

## 2020-06-09 DIAGNOSIS — N2581 Secondary hyperparathyroidism of renal origin: Secondary | ICD-10-CM | POA: Diagnosis not present

## 2020-06-09 DIAGNOSIS — N186 End stage renal disease: Secondary | ICD-10-CM | POA: Diagnosis not present

## 2020-06-09 DIAGNOSIS — D631 Anemia in chronic kidney disease: Secondary | ICD-10-CM | POA: Diagnosis not present

## 2020-06-10 DIAGNOSIS — L97521 Non-pressure chronic ulcer of other part of left foot limited to breakdown of skin: Secondary | ICD-10-CM | POA: Diagnosis not present

## 2020-06-10 DIAGNOSIS — S81802D Unspecified open wound, left lower leg, subsequent encounter: Secondary | ICD-10-CM | POA: Diagnosis not present

## 2020-06-10 DIAGNOSIS — Z4801 Encounter for change or removal of surgical wound dressing: Secondary | ICD-10-CM | POA: Diagnosis not present

## 2020-06-10 DIAGNOSIS — I120 Hypertensive chronic kidney disease with stage 5 chronic kidney disease or end stage renal disease: Secondary | ICD-10-CM | POA: Diagnosis not present

## 2020-06-10 DIAGNOSIS — Z48812 Encounter for surgical aftercare following surgery on the circulatory system: Secondary | ICD-10-CM | POA: Diagnosis not present

## 2020-06-10 DIAGNOSIS — E11621 Type 2 diabetes mellitus with foot ulcer: Secondary | ICD-10-CM | POA: Diagnosis not present

## 2020-06-11 DIAGNOSIS — N186 End stage renal disease: Secondary | ICD-10-CM | POA: Diagnosis not present

## 2020-06-11 DIAGNOSIS — D631 Anemia in chronic kidney disease: Secondary | ICD-10-CM | POA: Diagnosis not present

## 2020-06-11 DIAGNOSIS — D509 Iron deficiency anemia, unspecified: Secondary | ICD-10-CM | POA: Diagnosis not present

## 2020-06-11 DIAGNOSIS — Z992 Dependence on renal dialysis: Secondary | ICD-10-CM | POA: Diagnosis not present

## 2020-06-11 DIAGNOSIS — N2581 Secondary hyperparathyroidism of renal origin: Secondary | ICD-10-CM | POA: Diagnosis not present

## 2020-06-11 DIAGNOSIS — L03116 Cellulitis of left lower limb: Secondary | ICD-10-CM | POA: Diagnosis not present

## 2020-06-12 DIAGNOSIS — E785 Hyperlipidemia, unspecified: Secondary | ICD-10-CM | POA: Diagnosis not present

## 2020-06-12 DIAGNOSIS — E1142 Type 2 diabetes mellitus with diabetic polyneuropathy: Secondary | ICD-10-CM | POA: Diagnosis not present

## 2020-06-12 DIAGNOSIS — G43909 Migraine, unspecified, not intractable, without status migrainosus: Secondary | ICD-10-CM | POA: Diagnosis not present

## 2020-06-12 DIAGNOSIS — I120 Hypertensive chronic kidney disease with stage 5 chronic kidney disease or end stage renal disease: Secondary | ICD-10-CM | POA: Diagnosis not present

## 2020-06-12 DIAGNOSIS — K219 Gastro-esophageal reflux disease without esophagitis: Secondary | ICD-10-CM | POA: Diagnosis not present

## 2020-06-12 DIAGNOSIS — J9601 Acute respiratory failure with hypoxia: Secondary | ICD-10-CM | POA: Diagnosis not present

## 2020-06-12 DIAGNOSIS — I2510 Atherosclerotic heart disease of native coronary artery without angina pectoris: Secondary | ICD-10-CM | POA: Diagnosis not present

## 2020-06-12 DIAGNOSIS — F419 Anxiety disorder, unspecified: Secondary | ICD-10-CM | POA: Diagnosis not present

## 2020-06-12 DIAGNOSIS — J449 Chronic obstructive pulmonary disease, unspecified: Secondary | ICD-10-CM | POA: Diagnosis not present

## 2020-06-12 DIAGNOSIS — D631 Anemia in chronic kidney disease: Secondary | ICD-10-CM | POA: Diagnosis not present

## 2020-06-12 DIAGNOSIS — Z992 Dependence on renal dialysis: Secondary | ICD-10-CM | POA: Diagnosis not present

## 2020-06-12 DIAGNOSIS — E1151 Type 2 diabetes mellitus with diabetic peripheral angiopathy without gangrene: Secondary | ICD-10-CM | POA: Diagnosis not present

## 2020-06-12 DIAGNOSIS — Z89611 Acquired absence of right leg above knee: Secondary | ICD-10-CM | POA: Diagnosis not present

## 2020-06-12 DIAGNOSIS — F17210 Nicotine dependence, cigarettes, uncomplicated: Secondary | ICD-10-CM | POA: Diagnosis not present

## 2020-06-12 DIAGNOSIS — R1310 Dysphagia, unspecified: Secondary | ICD-10-CM | POA: Diagnosis not present

## 2020-06-12 DIAGNOSIS — M1991 Primary osteoarthritis, unspecified site: Secondary | ICD-10-CM | POA: Diagnosis not present

## 2020-06-12 DIAGNOSIS — E1122 Type 2 diabetes mellitus with diabetic chronic kidney disease: Secondary | ICD-10-CM | POA: Diagnosis not present

## 2020-06-12 DIAGNOSIS — N186 End stage renal disease: Secondary | ICD-10-CM | POA: Diagnosis not present

## 2020-06-12 DIAGNOSIS — F329 Major depressive disorder, single episode, unspecified: Secondary | ICD-10-CM | POA: Diagnosis not present

## 2020-06-12 DIAGNOSIS — Z79899 Other long term (current) drug therapy: Secondary | ICD-10-CM | POA: Diagnosis not present

## 2020-06-12 DIAGNOSIS — E039 Hypothyroidism, unspecified: Secondary | ICD-10-CM | POA: Diagnosis not present

## 2020-06-13 DIAGNOSIS — N186 End stage renal disease: Secondary | ICD-10-CM | POA: Diagnosis not present

## 2020-06-13 DIAGNOSIS — N2581 Secondary hyperparathyroidism of renal origin: Secondary | ICD-10-CM | POA: Diagnosis not present

## 2020-06-13 DIAGNOSIS — Z992 Dependence on renal dialysis: Secondary | ICD-10-CM | POA: Diagnosis not present

## 2020-06-13 DIAGNOSIS — D509 Iron deficiency anemia, unspecified: Secondary | ICD-10-CM | POA: Diagnosis not present

## 2020-06-13 DIAGNOSIS — D631 Anemia in chronic kidney disease: Secondary | ICD-10-CM | POA: Diagnosis not present

## 2020-06-14 DIAGNOSIS — Z992 Dependence on renal dialysis: Secondary | ICD-10-CM | POA: Diagnosis not present

## 2020-06-14 DIAGNOSIS — D509 Iron deficiency anemia, unspecified: Secondary | ICD-10-CM | POA: Diagnosis not present

## 2020-06-14 DIAGNOSIS — N186 End stage renal disease: Secondary | ICD-10-CM | POA: Diagnosis not present

## 2020-06-14 DIAGNOSIS — D631 Anemia in chronic kidney disease: Secondary | ICD-10-CM | POA: Diagnosis not present

## 2020-06-14 DIAGNOSIS — N2581 Secondary hyperparathyroidism of renal origin: Secondary | ICD-10-CM | POA: Diagnosis not present

## 2020-06-16 DIAGNOSIS — N186 End stage renal disease: Secondary | ICD-10-CM | POA: Diagnosis not present

## 2020-06-16 DIAGNOSIS — N2581 Secondary hyperparathyroidism of renal origin: Secondary | ICD-10-CM | POA: Diagnosis not present

## 2020-06-16 DIAGNOSIS — D631 Anemia in chronic kidney disease: Secondary | ICD-10-CM | POA: Diagnosis not present

## 2020-06-16 DIAGNOSIS — D509 Iron deficiency anemia, unspecified: Secondary | ICD-10-CM | POA: Diagnosis not present

## 2020-06-16 DIAGNOSIS — Z992 Dependence on renal dialysis: Secondary | ICD-10-CM | POA: Diagnosis not present

## 2020-06-17 DIAGNOSIS — D631 Anemia in chronic kidney disease: Secondary | ICD-10-CM | POA: Diagnosis not present

## 2020-06-17 DIAGNOSIS — E1142 Type 2 diabetes mellitus with diabetic polyneuropathy: Secondary | ICD-10-CM | POA: Diagnosis not present

## 2020-06-17 DIAGNOSIS — I120 Hypertensive chronic kidney disease with stage 5 chronic kidney disease or end stage renal disease: Secondary | ICD-10-CM | POA: Diagnosis not present

## 2020-06-17 DIAGNOSIS — E1151 Type 2 diabetes mellitus with diabetic peripheral angiopathy without gangrene: Secondary | ICD-10-CM | POA: Diagnosis not present

## 2020-06-17 DIAGNOSIS — N186 End stage renal disease: Secondary | ICD-10-CM | POA: Diagnosis not present

## 2020-06-17 DIAGNOSIS — E1122 Type 2 diabetes mellitus with diabetic chronic kidney disease: Secondary | ICD-10-CM | POA: Diagnosis not present

## 2020-06-18 DIAGNOSIS — N186 End stage renal disease: Secondary | ICD-10-CM | POA: Diagnosis not present

## 2020-06-18 DIAGNOSIS — Z992 Dependence on renal dialysis: Secondary | ICD-10-CM | POA: Diagnosis not present

## 2020-06-18 DIAGNOSIS — N2581 Secondary hyperparathyroidism of renal origin: Secondary | ICD-10-CM | POA: Diagnosis not present

## 2020-06-18 DIAGNOSIS — D631 Anemia in chronic kidney disease: Secondary | ICD-10-CM | POA: Diagnosis not present

## 2020-06-18 DIAGNOSIS — D509 Iron deficiency anemia, unspecified: Secondary | ICD-10-CM | POA: Diagnosis not present

## 2020-06-21 DIAGNOSIS — N2581 Secondary hyperparathyroidism of renal origin: Secondary | ICD-10-CM | POA: Diagnosis not present

## 2020-06-21 DIAGNOSIS — Z992 Dependence on renal dialysis: Secondary | ICD-10-CM | POA: Diagnosis not present

## 2020-06-21 DIAGNOSIS — D509 Iron deficiency anemia, unspecified: Secondary | ICD-10-CM | POA: Diagnosis not present

## 2020-06-21 DIAGNOSIS — D631 Anemia in chronic kidney disease: Secondary | ICD-10-CM | POA: Diagnosis not present

## 2020-06-21 DIAGNOSIS — N186 End stage renal disease: Secondary | ICD-10-CM | POA: Diagnosis not present

## 2020-06-22 DIAGNOSIS — D631 Anemia in chronic kidney disease: Secondary | ICD-10-CM | POA: Diagnosis not present

## 2020-06-22 DIAGNOSIS — N186 End stage renal disease: Secondary | ICD-10-CM | POA: Diagnosis not present

## 2020-06-22 DIAGNOSIS — E1142 Type 2 diabetes mellitus with diabetic polyneuropathy: Secondary | ICD-10-CM | POA: Diagnosis not present

## 2020-06-22 DIAGNOSIS — E1122 Type 2 diabetes mellitus with diabetic chronic kidney disease: Secondary | ICD-10-CM | POA: Diagnosis not present

## 2020-06-22 DIAGNOSIS — I120 Hypertensive chronic kidney disease with stage 5 chronic kidney disease or end stage renal disease: Secondary | ICD-10-CM | POA: Diagnosis not present

## 2020-06-22 DIAGNOSIS — E1151 Type 2 diabetes mellitus with diabetic peripheral angiopathy without gangrene: Secondary | ICD-10-CM | POA: Diagnosis not present

## 2020-06-23 DIAGNOSIS — N2581 Secondary hyperparathyroidism of renal origin: Secondary | ICD-10-CM | POA: Diagnosis not present

## 2020-06-23 DIAGNOSIS — N186 End stage renal disease: Secondary | ICD-10-CM | POA: Diagnosis not present

## 2020-06-23 DIAGNOSIS — D631 Anemia in chronic kidney disease: Secondary | ICD-10-CM | POA: Diagnosis not present

## 2020-06-23 DIAGNOSIS — Z992 Dependence on renal dialysis: Secondary | ICD-10-CM | POA: Diagnosis not present

## 2020-06-23 DIAGNOSIS — D509 Iron deficiency anemia, unspecified: Secondary | ICD-10-CM | POA: Diagnosis not present

## 2020-06-24 ENCOUNTER — Telehealth: Payer: Self-pay | Admitting: Neurology

## 2020-06-24 NOTE — Telephone Encounter (Signed)
[name] with [facility] called in stating the patient was referred there by [place] and they need records of the patient's spinal tap and what has gone on between his last visit. Fax number is [phone]

## 2020-06-25 DIAGNOSIS — D509 Iron deficiency anemia, unspecified: Secondary | ICD-10-CM | POA: Diagnosis not present

## 2020-06-25 DIAGNOSIS — N2581 Secondary hyperparathyroidism of renal origin: Secondary | ICD-10-CM | POA: Diagnosis not present

## 2020-06-25 DIAGNOSIS — D631 Anemia in chronic kidney disease: Secondary | ICD-10-CM | POA: Diagnosis not present

## 2020-06-25 DIAGNOSIS — N186 End stage renal disease: Secondary | ICD-10-CM | POA: Diagnosis not present

## 2020-06-25 DIAGNOSIS — Z992 Dependence on renal dialysis: Secondary | ICD-10-CM | POA: Diagnosis not present

## 2020-06-27 DIAGNOSIS — H4710 Unspecified papilledema: Secondary | ICD-10-CM | POA: Diagnosis not present

## 2020-06-27 NOTE — Telephone Encounter (Signed)
Left message for patient to contact office.

## 2020-06-27 NOTE — Telephone Encounter (Signed)
Tee, please send my last office note and lumbar puncture results from radiology.  Please get the notes from [facility] from where he was seen

## 2020-06-27 NOTE — Telephone Encounter (Signed)
Requested documents faxed.  Spoke with Gae Bon who states the patient has 3 missed appointments.   Spoke with Dr Tat who states I need to contact the patient and remind the patient that he needs to go to his appointments.

## 2020-06-28 DIAGNOSIS — D509 Iron deficiency anemia, unspecified: Secondary | ICD-10-CM | POA: Diagnosis not present

## 2020-06-28 DIAGNOSIS — D631 Anemia in chronic kidney disease: Secondary | ICD-10-CM | POA: Diagnosis not present

## 2020-06-28 DIAGNOSIS — Z992 Dependence on renal dialysis: Secondary | ICD-10-CM | POA: Diagnosis not present

## 2020-06-28 DIAGNOSIS — N186 End stage renal disease: Secondary | ICD-10-CM | POA: Diagnosis not present

## 2020-06-28 DIAGNOSIS — N2581 Secondary hyperparathyroidism of renal origin: Secondary | ICD-10-CM | POA: Diagnosis not present

## 2020-06-29 DIAGNOSIS — E1142 Type 2 diabetes mellitus with diabetic polyneuropathy: Secondary | ICD-10-CM | POA: Diagnosis not present

## 2020-06-29 DIAGNOSIS — E1122 Type 2 diabetes mellitus with diabetic chronic kidney disease: Secondary | ICD-10-CM | POA: Diagnosis not present

## 2020-06-29 DIAGNOSIS — I120 Hypertensive chronic kidney disease with stage 5 chronic kidney disease or end stage renal disease: Secondary | ICD-10-CM | POA: Diagnosis not present

## 2020-06-29 DIAGNOSIS — N186 End stage renal disease: Secondary | ICD-10-CM | POA: Diagnosis not present

## 2020-06-29 DIAGNOSIS — D631 Anemia in chronic kidney disease: Secondary | ICD-10-CM | POA: Diagnosis not present

## 2020-06-29 DIAGNOSIS — E1151 Type 2 diabetes mellitus with diabetic peripheral angiopathy without gangrene: Secondary | ICD-10-CM | POA: Diagnosis not present

## 2020-06-30 DIAGNOSIS — N2581 Secondary hyperparathyroidism of renal origin: Secondary | ICD-10-CM | POA: Diagnosis not present

## 2020-06-30 DIAGNOSIS — D509 Iron deficiency anemia, unspecified: Secondary | ICD-10-CM | POA: Diagnosis not present

## 2020-06-30 DIAGNOSIS — N186 End stage renal disease: Secondary | ICD-10-CM | POA: Diagnosis not present

## 2020-06-30 DIAGNOSIS — D631 Anemia in chronic kidney disease: Secondary | ICD-10-CM | POA: Diagnosis not present

## 2020-06-30 DIAGNOSIS — Z992 Dependence on renal dialysis: Secondary | ICD-10-CM | POA: Diagnosis not present

## 2020-07-02 DIAGNOSIS — D509 Iron deficiency anemia, unspecified: Secondary | ICD-10-CM | POA: Diagnosis not present

## 2020-07-02 DIAGNOSIS — Z992 Dependence on renal dialysis: Secondary | ICD-10-CM | POA: Diagnosis not present

## 2020-07-02 DIAGNOSIS — D631 Anemia in chronic kidney disease: Secondary | ICD-10-CM | POA: Diagnosis not present

## 2020-07-02 DIAGNOSIS — N186 End stage renal disease: Secondary | ICD-10-CM | POA: Diagnosis not present

## 2020-07-02 DIAGNOSIS — N2581 Secondary hyperparathyroidism of renal origin: Secondary | ICD-10-CM | POA: Diagnosis not present

## 2020-07-03 DIAGNOSIS — Z992 Dependence on renal dialysis: Secondary | ICD-10-CM | POA: Diagnosis not present

## 2020-07-03 DIAGNOSIS — N186 End stage renal disease: Secondary | ICD-10-CM | POA: Diagnosis not present

## 2020-07-03 DIAGNOSIS — D631 Anemia in chronic kidney disease: Secondary | ICD-10-CM | POA: Diagnosis not present

## 2020-07-03 DIAGNOSIS — D509 Iron deficiency anemia, unspecified: Secondary | ICD-10-CM | POA: Diagnosis not present

## 2020-07-03 DIAGNOSIS — N2581 Secondary hyperparathyroidism of renal origin: Secondary | ICD-10-CM | POA: Diagnosis not present

## 2020-07-04 ENCOUNTER — Telehealth: Payer: Self-pay | Admitting: Neurology

## 2020-07-04 ENCOUNTER — Encounter: Payer: Self-pay | Admitting: Cardiology

## 2020-07-04 ENCOUNTER — Ambulatory Visit (INDEPENDENT_AMBULATORY_CARE_PROVIDER_SITE_OTHER): Payer: Medicare Other | Admitting: Cardiology

## 2020-07-04 ENCOUNTER — Encounter: Payer: Self-pay | Admitting: *Deleted

## 2020-07-04 VITALS — BP 162/80 | HR 79 | Ht 76.0 in | Wt 243.0 lb

## 2020-07-04 DIAGNOSIS — Z992 Dependence on renal dialysis: Secondary | ICD-10-CM | POA: Diagnosis not present

## 2020-07-04 DIAGNOSIS — E782 Mixed hyperlipidemia: Secondary | ICD-10-CM | POA: Diagnosis not present

## 2020-07-04 DIAGNOSIS — N186 End stage renal disease: Secondary | ICD-10-CM

## 2020-07-04 DIAGNOSIS — I6522 Occlusion and stenosis of left carotid artery: Secondary | ICD-10-CM | POA: Diagnosis not present

## 2020-07-04 DIAGNOSIS — I739 Peripheral vascular disease, unspecified: Secondary | ICD-10-CM | POA: Diagnosis not present

## 2020-07-04 DIAGNOSIS — I25119 Atherosclerotic heart disease of native coronary artery with unspecified angina pectoris: Secondary | ICD-10-CM

## 2020-07-04 NOTE — Patient Instructions (Addendum)

## 2020-07-04 NOTE — Telephone Encounter (Signed)
[facility] sent hand written note.  Difficult to read.  I did note that it said nerve improving but still with pallor and to continue topamax.

## 2020-07-04 NOTE — Progress Notes (Signed)
Cardiology Office Note  Date: 07/04/2020   ID: [name], DOB 05-15-1951, MRN [id number]  PCP:  Caryl Bis, MD  Cardiologist:  Rozann Lesches, MD Electrophysiologist:  None   Chief Complaint  Patient presents with  . Cardiac follow-up    History of Present Illness: [name] is a 69 y.o. male former patient of Dr. Bronson Ing now presenting to establish follow-up with me.  I reviewed his records and updated the chart.  He was last seen in May.  He is here today for a routine visit.  Reports no chest pain or sense of palpitations.  He states that he has been consistent with hemodialysis sessions.  I reviewed his medications which are outlined below.  He continues to follow with [facility] status post debridement of left vein harvest incision by Dr. 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  He has a history of revascularization surgery on that side in August.  I reviewed his most recent lab work as outlined below.  Past Medical History:  Diagnosis Date  . Anemia   . Anxiety   . Arthritis   . CAD (coronary artery disease)    Myoview August 2021 with evidence of large inferior scar but no active ischemia  . COPD (chronic obstructive pulmonary disease) ([place])   . Depression   . ESRD on hemodialysis ([place])   . Essential hypertension   . GERD (gastroesophageal reflux disease)   . H/O hiatal hernia   . Headache(784.0)   . History of kidney stones   . History of pneumonia   . Hypothyroidism   . Neuropathy   . Non-healing wound of amputation stump (HCC)    Right  . Peripheral vascular disease ([place])   . PTSD (post-traumatic stress disorder)   . Type 2 diabetes mellitus ([place])     Past Surgical History:  Procedure Laterality Date  . A/V FISTULAGRAM Left 01/16/2019   Procedure: A/V FISTULAGRAM;  Surgeon: [name], MD;  Location: [place] [facility];  Service: Cardiovascular;  Laterality: Left;  . ABDOMINAL AORTOGRAM  W/LOWER EXTREMITY Bilateral 03/18/2020   Procedure: ABDOMINAL AORTOGRAM W/LOWER EXTREMITY;  Surgeon: [name], MD;  Location: [place] [facility];  Service: Cardiovascular;  Laterality: Bilateral;  . AMPUTATION Right 12/01/2018   Procedure: RIGHT AMPUTATION BELOW KNEE;  Surgeon: [name], MD;  Location: [facility] OR;  Service: Vascular;  Laterality: Right;  . AMPUTATION Right 04/27/2019   Procedure: AMPUTATION BELOW KNEE REVISION;  Surgeon: [name], MD;  Location: [place];  Service: Vascular;  Laterality: Right;  . AMPUTATION Right 04/30/2019   Procedure: AMPUTATION ABOVE KNEE - RIGHT;  Surgeon: Waynetta Sandy, MD;  Location: [place];  Service: Vascular;  Laterality: Right;  . APPLICATION OF WOUND VAC Right 04/27/2019   Procedure: APPLICATION OF WOUND VAC;  Surgeon: [name], MD;  Location: [place];  Service: Vascular;  Laterality: Right;  . AV FISTULA PLACEMENT  2012      left arm   . AV FISTULA PLACEMENT Left 12/18/2012   Procedure: ARTERIOVENOUS (AV) FISTULA CREATION;  Surgeon: Angelia Mould, MD;  Location: [place];  Service: Vascular;  Laterality: Left;  . COLONOSCOPY  10/26/2011   Procedure: COLONOSCOPY;  Surgeon: Rogene Houston, MD;  [facility];  Service: Endoscopy;  Laterality: N/A;  730  . EMBOLECTOMY Left 12/09/2012   Procedure: EMBOLECTOMY;  Surgeon: Serafina Mitchell, MD;  Location: [facility]  CATH LAB;  Service: Cardiovascular;  Laterality: Left;  left arm venous embolization  . ENDARTERECTOMY FEMORAL Left 04/04/2020   Procedure: ENDARTERECTOMY COMMON FEMORAL;  Surgeon: [name], MD;  Location[facility] OR;  Service: Vascular;  Laterality: Left;  . ESOPHAGOGASTRODUODENOSCOPY (EGD) WITH ESOPHAGEAL DILATION N/A 04/23/2013   Procedure: ESOPHAGOGASTRODUODENOSCOPY (EGD) WITH ESOPHAGEAL DILATION;  Surgeon: Rogene Houston, MD;  [facility];  Service: Endoscopy;  Laterality: N/A;  200-moved to 930   . FEMORAL-POPLITEAL BYPASS GRAFT Left  04/04/2020   Procedure: FEMORAL- BELOW KNEE POPLITEAL BYPASS GRAFT NON REVERSED VEIN;  Surgeon: [name], MD;  Location: [place];  Service: Vascular;  Laterality: Left;  . FISTULA SUPERFICIALIZATION Left 06/18/2013   Procedure: FISTULA SUPERFICIALIZATION & LIGATION BRANCH X 1;  Surgeon: Mal Misty, MD;  Location: [place];  Service: Vascular;  Laterality: Left;  . GROIN DEBRIDEMENT Left 04/29/2020   Procedure: EXPLORATION LEFT GROIN WITH DEBRIDEMENT;  Surgeon: Rosetta Posner, MD;  Location: [place];  Service: Vascular;  Laterality: Left;  . HEMATOMA EVACUATION Right 02/11/2017   Procedure: EVACUATION HEMATOMA RIGHT GROIN, Repair of Right Pseudo-anerysm.;  Surgeon: [name], MD;  Location: [facility];  Service: Vascular;  Laterality: Right;  . INGUINAL HERNIA REPAIR     ,  times   2  . INSERTION OF DIALYSIS CATHETER Left 12/18/2012   Procedure: INSERTION OF DIALYSIS CATHETER;  Surgeon: Angelia Mould, MD;  Location: [place];  Service: Vascular;  Laterality: Left;  . KNEE ARTHROSCOPY  2011   Right Knee  . LOWER EXTREMITY ANGIOGRAPHY N/A 02/11/2017   Procedure: Lower Extremity Angiography;  Surgeon: Lorretta Harp, MD;  Location: [place] [facility];  Service: Cardiovascular;  Laterality: N/A;  . PERIPHERAL ATHRECTOMY  02/11/2017  . PERIPHERAL VASCULAR ATHERECTOMY Left 02/11/2017   Procedure: Peripheral Vascular Atherectomy;  Surgeon: Lorretta Harp, MD;  Location: [place] [facility];  Service: Cardiovascular;  Laterality: Left;  . PERIPHERAL VASCULAR BALLOON ANGIOPLASTY Left 01/16/2019   Procedure: PERIPHERAL VASCULAR BALLOON ANGIOPLASTY;  Surgeon: [name], MD;  Location: [place] [facility];  Service: Cardiovascular;  Laterality: Left;  central vein  . REVISON OF ARTERIOVENOUS FISTULA Left 12/04/5359   Procedure: PLICATION OF LEFT BRACHIOCEPHALIC ARTERIOVENOUS FISTULA;  Surgeon: Conrad [place], MD;  Location: [place];  Service: Vascular;  Laterality: Left;  . SHUNTOGRAM N/A 12/09/2012    Procedure: fistulogram;  Surgeon: Serafina Mitchell, MD;  Location: [facility] CATH LAB;  Service: Cardiovascular;  Laterality: N/A;  . SHUNTOGRAM Left 06/03/2013   Procedure: Fistulogram;  Surgeon: Serafina Mitchell, MD;  Location: [facility] CATH LAB;  Service: Cardiovascular;  Laterality: Left;  . THROMBECTOMY W/ EMBOLECTOMY Left 12/11/2012   Procedure: THROMBECTOMY ARTERIOVENOUS FISTULA;  Surgeon: Serafina Mitchell, MD;  Location: [place];  Service: Vascular;  Laterality: Left;  . TONSILLECTOMY    . WOUND DEBRIDEMENT Right 02/11/2019   Procedure: DEBRIDEMENT WOUND RIGHT BELOW THE KNEE STUMP;  Surgeon: Serafina Mitchell, MD;  Location: [facility];  Service: Vascular;  Laterality: Right;    Current Outpatient Medications  Medication Sig Dispense Refill  . albuterol (VENTOLIN HFA) 108 (90 Base) MCG/ACT inhaler Inhale 2 puffs into the lungs every 6 (six) hours as needed for wheezing or shortness of breath.     . ALPRAZolam (XANAX) 0.25 MG tablet Take 0.25 mg by mouth 2 (two) times daily as needed.    . ARTIFICIAL TEAR OP Place 1 drop into both eyes every 6 (six) hours  as needed (dry eyes).    Marland Kitchen atorvastatin (LIPITOR) 80 MG tablet Take 1 tablet (80 mg total) by mouth at bedtime. (Patient taking differently: Take 80 mg by mouth daily. ) 30 tablet 5  . bismuth subsalicylate (PEPTO BISMOL) 262 MG/15ML suspension Take 30 mLs by mouth every 6 (six) hours as needed for indigestion.    . calcium acetate (PHOSLO) 667 MG capsule Take 4 capsules (2,668 mg total) by mouth 3 (three) times daily with meals. (Patient taking differently: Take 1,334-2,001 mg by mouth See admin instructions. Take 2001 mg with meals and 1334 with each snack) 360 capsule 0  . cinacalcet (SENSIPAR) 30 MG tablet Take 1 tablet (30 mg total) by mouth every evening. 60 tablet 0  . clopidogrel (PLAVIX) 75 MG tablet Take 1 tablet (75 mg total) by mouth daily. 30 tablet 0  . cyclobenzaprine (FLEXERIL) 10 MG tablet Take 10 mg by mouth 3 (three) times daily as needed.     . diphenhydrAMINE (BENADRYL) 25 MG tablet Take 25 mg by mouth daily as needed for itching.    . DULoxetine (CYMBALTA) 30 MG capsule Take 30 mg by mouth daily.    Marland Kitchen GLOBAL EASE INJECT PEN NEEDLES 32G X 4 MM MISC TO BE USED DAILY WITH INSULIN    . ibuprofen (ADVIL) 200 MG tablet Take 400 mg by mouth 3 (three) times daily.     Marland Kitchen levothyroxine (SYNTHROID) 125 MCG tablet Take 1 tablet (125 mcg total) by mouth daily before breakfast. (Patient taking differently: Take 125 mcg by mouth at bedtime. ) 30 tablet 0  . lisinopril (ZESTRIL) 10 MG tablet Take 10 mg by mouth 4 (four) times a week. Take 10 mg daily on non dialysis days (Sun, Mon, Wed, and Fri)    . Melatonin 10 MG CAPS Take 10 mg by mouth at bedtime.    . midodrine (PROAMATINE) 10 MG tablet Take 10 mg by mouth Every Tuesday,Thursday,and Saturday with dialysis.    Marland Kitchen multivitamin (RENA-VIT) TABS tablet Take 1 tablet by mouth daily.    . polyethylene glycol (MIRALAX / GLYCOLAX) 17 g packet Take 17 g by mouth daily as needed for moderate constipation.    . pregabalin (LYRICA) 150 MG capsule Take 1 capsule (150 mg total) by mouth 3 (three) times daily. 90 capsule 0  . sevelamer carbonate (RENVELA) 800 MG tablet Take 1,600-3,200 mg by mouth See admin instructions. Take 3200 mg with each meal and 1600 mg with each snack    . topiramate (TOPAMAX) 25 MG tablet Take 2 tablets (50 mg total) by mouth 2 (two) times daily. To be given post dialysis, titrated as directed to patient 360 tablet 1  . TOUJEO SOLOSTAR 300 UNIT/ML SOPN Inject 54-78 Units as directed at bedtime as needed (if blood sugar is 130 or higher).      Current Facility-Administered Medications  Medication Dose Route Frequency Provider Last Rate Last Admin  . 0.9 %  sodium chloride infusion  250 mL Intravenous PRN [name], MD       Allergies:  Ace inhibitors, Clonidine, Morphine and related, and Oxytocin   ROS: No syncope.  Physical Exam: VS:  BP (!) 162/80   Pulse 79   Ht 6'  4" (1.93 m)   Wt 243 lb (110.2 kg)   SpO2 98%   BMI 29.58 kg/m , BMI Body mass index is 29.58 kg/m.  Wt Readings from Last 3 Encounters:  07/04/20 243 lb (110.2 kg)  06/03/20 243 lb 6.2 oz (  110.4 kg)  04/29/20 243 lb 6.2 oz (110.4 kg)    General: Patient appears comfortable at rest. HEENT: Conjunctiva and lids normal, wearing a mask. Neck: Supple, no elevated JVP or carotid bruits. Lungs: Clear to auscultation, nonlabored breathing at rest. Cardiac: Regular rate and rhythm, no S3, vascular bruits from left upper extremity AV fistula. Abdomen: Soft, nontender, bowel sounds present. Extremities: Left arm AV fistula.  ECG:  An ECG dated 01/06/2020 was personally reviewed today and demonstrated:  Sinus rhythm with right bundle branch block and ventricular bigeminy.  Recent Labwork: 04/01/2020: ALT 17; AST 19 04/07/2020: Platelets 148 04/29/2020: BUN 37; Creatinine, Ser 6.70; Hemoglobin 8.8; Potassium 4.2; Sodium 139     Component Value Date/Time   CHOL 60 04/05/2020 0428   TRIG 155 (H) 04/05/2020 0428   HDL 17 (L) 04/05/2020 0428   CHOLHDL 3.5 04/05/2020 0428   VLDL 31 04/05/2020 0428   LDLCALC 12 04/05/2020 0428    Other Studies Reviewed Today:  [facility] Myoview 04/01/2020:  The left ventricular ejection fraction is moderately decreased (30-44%).  Nuclear stress EF: 39%.  No T wave inversion was noted during stress.  There was no ST segment deviation noted during stress.  Defect 1: There is a large defect of severe severity present in the mid inferior and apical inferior location.  Findings consistent with prior myocardial infarction.  This is an intermediate risk study.   Large size, severe severity fixed inferior perfusion defect consistent with infarct/scar or much less likely artifact related to RBBB. No significant reversible ischemia. LVEF 39% with basal to mid inferior akinesis. This is an intermediate risk study.  Echocardiogram 02/02/2020: 1. Left ventricular  ejection fraction, by estimation, is 55 to 60%. The  left ventricle has normal function. The left ventricle has no regional  wall motion abnormalities. The left ventricular internal cavity size was  mildly dilated. There is moderate  left ventricular hypertrophy. Left ventricular diastolic parameters are  consistent with Grade I diastolic dysfunction (impaired relaxation).  2. Right ventricular systolic function is normal. The right ventricular  size is normal. There is normal pulmonary artery systolic pressure. The  estimated right ventricular systolic pressure is 08.1 mmHg.  3. Left atrial size was mildly dilated.  4. The mitral valve is grossly normal, mildly calcified annulus. Trivial  mitral valve regurgitation.  5. The aortic valve is tricuspid. Aortic valve regurgitation is not  visualized. Mild aortic valve stenosis. Aortic valve area, by VTI measures  1.49 cm. Aortic valve mean gradient measures 13.0 mmHg.  6. The inferior vena cava is normal in size with greater than 50%  respiratory variability, suggesting right atrial pressure of 3 mmHg.   Assessment and Plan:  1.  CAD based on Myoview from August demonstrating large inferior wall scar but no active ischemia.  LVEF 55 to 60% by echocardiogram.  He does not report any active angina.  Continue Plavix, lisinopril, and Lipitor.  2.  Mixed hyperlipidemia, he is on high-dose Lipitor.  Last LDL quite low at 12.  3.  ESRD on hemodialysis.  4.  Severe PAD as outlined above, continue to follow with Dr. [name].  Medication Adjustments/Labs and Tests Ordered: Current medicines are reviewed at length with the patient today.  Concerns regarding medicines are outlined above.   Tests Ordered: No orders of the defined types were placed in this encounter.   Medication Changes: No orders of the defined types were placed in this encounter.   Disposition:  Follow up 6 months in the  [place] office.  Signed, Satira Sark, MD,  [facility] 07/04/2020 4:29 PM    [facility] [place], [place], [place] [address] Phone: [phone]; Fax: [phone]

## 2020-07-05 DIAGNOSIS — D509 Iron deficiency anemia, unspecified: Secondary | ICD-10-CM | POA: Diagnosis not present

## 2020-07-05 DIAGNOSIS — N2581 Secondary hyperparathyroidism of renal origin: Secondary | ICD-10-CM | POA: Diagnosis not present

## 2020-07-05 DIAGNOSIS — D631 Anemia in chronic kidney disease: Secondary | ICD-10-CM | POA: Diagnosis not present

## 2020-07-05 DIAGNOSIS — Z992 Dependence on renal dialysis: Secondary | ICD-10-CM | POA: Diagnosis not present

## 2020-07-05 DIAGNOSIS — N186 End stage renal disease: Secondary | ICD-10-CM | POA: Diagnosis not present

## 2020-07-06 ENCOUNTER — Telehealth: Payer: Self-pay | Admitting: Neurology

## 2020-07-06 NOTE — Telephone Encounter (Signed)
No answer at 114

## 2020-07-06 NOTE — Telephone Encounter (Signed)
Patient called to let Dr Tat know that he increased the Topiramate up to 100mg  daily as instructed but it made him sick and dizzy. He has stopped taking it completely (today is 2 days of not taking the medication) and he reports that he feels better. He would like to know what he should do from here? Is there something that can help him not to feel sick if he needs to continue to take the Topiramate? He doesn't think it's helping his eye, he states he still can't see out of it. He feels he should see Dr Tat before his 09/19/20 appointment, but I let him know that there isn't another appointment available before then.   Please call.

## 2020-07-07 DIAGNOSIS — Z992 Dependence on renal dialysis: Secondary | ICD-10-CM | POA: Diagnosis not present

## 2020-07-07 DIAGNOSIS — N2581 Secondary hyperparathyroidism of renal origin: Secondary | ICD-10-CM | POA: Diagnosis not present

## 2020-07-07 DIAGNOSIS — D631 Anemia in chronic kidney disease: Secondary | ICD-10-CM | POA: Diagnosis not present

## 2020-07-07 DIAGNOSIS — D509 Iron deficiency anemia, unspecified: Secondary | ICD-10-CM | POA: Diagnosis not present

## 2020-07-07 DIAGNOSIS — N186 End stage renal disease: Secondary | ICD-10-CM | POA: Diagnosis not present

## 2020-07-09 DIAGNOSIS — N2581 Secondary hyperparathyroidism of renal origin: Secondary | ICD-10-CM | POA: Diagnosis not present

## 2020-07-09 DIAGNOSIS — N186 End stage renal disease: Secondary | ICD-10-CM | POA: Diagnosis not present

## 2020-07-09 DIAGNOSIS — D509 Iron deficiency anemia, unspecified: Secondary | ICD-10-CM | POA: Diagnosis not present

## 2020-07-09 DIAGNOSIS — D631 Anemia in chronic kidney disease: Secondary | ICD-10-CM | POA: Diagnosis not present

## 2020-07-09 DIAGNOSIS — Z992 Dependence on renal dialysis: Secondary | ICD-10-CM | POA: Diagnosis not present

## 2020-07-11 NOTE — Telephone Encounter (Signed)
I had gotten a note from the eye doctor that said things were a bit better on the medication.  Unfortunately, given his situation with dialysis, this was really the only option except for putting in a shunt.  We probably should refer him to neurosx to see if that is an option.  Is that okay with him?

## 2020-07-11 NOTE — Telephone Encounter (Signed)
Spoke patient who states he is still sick.   He states he does not want to do the shunt.   Patient states he stopped the Topamax because it was making him sick. He states it caused him to have a headache, hot flashes, and upset stomach. Patient states he last took this medication just before Thanksgiving.

## 2020-07-12 DIAGNOSIS — N2581 Secondary hyperparathyroidism of renal origin: Secondary | ICD-10-CM | POA: Diagnosis not present

## 2020-07-12 DIAGNOSIS — N186 End stage renal disease: Secondary | ICD-10-CM | POA: Diagnosis not present

## 2020-07-12 DIAGNOSIS — D631 Anemia in chronic kidney disease: Secondary | ICD-10-CM | POA: Diagnosis not present

## 2020-07-12 DIAGNOSIS — Z992 Dependence on renal dialysis: Secondary | ICD-10-CM | POA: Diagnosis not present

## 2020-07-12 DIAGNOSIS — E1122 Type 2 diabetes mellitus with diabetic chronic kidney disease: Secondary | ICD-10-CM | POA: Diagnosis not present

## 2020-07-12 DIAGNOSIS — D509 Iron deficiency anemia, unspecified: Secondary | ICD-10-CM | POA: Diagnosis not present

## 2020-07-12 NOTE — Telephone Encounter (Signed)
Left message for patient to contact office.

## 2020-07-12 NOTE — Telephone Encounter (Signed)
Patient given Dr Doristine Devoid recommendations. He states he has an computer but will check to see if it has a camera.    Patient is agreeable with video visit.   He states he is feeling sick and he doesn't know why he feels so bad.

## 2020-07-12 NOTE — Telephone Encounter (Signed)
Patient left message with accessnurse stating he's returning a call from the office.

## 2020-07-12 NOTE — Telephone Encounter (Signed)
Patient called in returning a call from [place].

## 2020-07-12 NOTE — Telephone Encounter (Signed)
It wouldn't last this long, esp if he has had dialysis since then.  I believe he has the symptoms but don't think that they are likely from the medication at this point.  We can do a video visit to discuss but unfortunately with his medical issues and him declining a shunt (or even going to see someone about the potential) I don't think that I have other options.

## 2020-07-13 DIAGNOSIS — Z992 Dependence on renal dialysis: Secondary | ICD-10-CM | POA: Diagnosis not present

## 2020-07-13 DIAGNOSIS — G932 Benign intracranial hypertension: Secondary | ICD-10-CM | POA: Diagnosis not present

## 2020-07-13 DIAGNOSIS — I10 Essential (primary) hypertension: Secondary | ICD-10-CM | POA: Diagnosis not present

## 2020-07-13 DIAGNOSIS — N186 End stage renal disease: Secondary | ICD-10-CM | POA: Diagnosis not present

## 2020-07-13 DIAGNOSIS — N2581 Secondary hyperparathyroidism of renal origin: Secondary | ICD-10-CM | POA: Diagnosis not present

## 2020-07-13 DIAGNOSIS — D509 Iron deficiency anemia, unspecified: Secondary | ICD-10-CM | POA: Diagnosis not present

## 2020-07-13 DIAGNOSIS — D631 Anemia in chronic kidney disease: Secondary | ICD-10-CM | POA: Diagnosis not present

## 2020-07-13 NOTE — Telephone Encounter (Signed)
Pt is sch for 07-18-20 with Dr Tat as a VV he will call back with the Email Address

## 2020-07-13 NOTE — Progress Notes (Addendum)
Virtual Visit Via Video   The purpose of this virtual visit is to provide medical care while limiting exposure to the novel coronavirus.    Consent was obtained for video visit:  Yes.   Answered questions that patient had about telehealth interaction:  Yes.   I discussed the limitations, risks, security and privacy concerns of performing an evaluation and management service by telemedicine. I also discussed with the patient that there may be a patient responsible charge related to this service. The patient expressed understanding and agreed to proceed.  Pt location: Home Physician Location: office Name of referring provider:  Caryl Bis, MD I connected with [name] at patients initiation/request on 07/18/2020 at 10:15 AM EST by video enabled telemedicine application and verified that I am speaking with the correct person using two identifiers. Pt MRN:  [id number] Pt DOB:  1950/11/30 Video Participants:  [name];    Assessment/Plan:   1.  Probable pseudotumor cerebri/increased idiopathic intracranial hypertension, with associated visual field loss             -Lumbar puncture opening pressure 280 millimeters water             -Options very limited given patient is a dialysis patient.  Long discussion with patient and wife.   Diamox is really not ideal in these patients.  Patient did not think he tolerated topiramate, although I am not convinced that the perceived side effects (hot flashes, GI upset, headache) are from topiramate.  In fact, topiramate is a well-known headache preventative medication.  In addition, he had been off of topiramate for quite some time and still had these issues that he thought were side effects from the drug, which would not be given that he has had dialysis.  Discussed with the patient that I have very other few options and really no medical options.  His only other treatment options would be VP shunt.  He asked many questions about that today  and I answered those to the best of my ability, but told him if he wanted to consider that, his questions really should be best directed with a neurosurgery consult.  He will think about it, but does not think he is interested.  I had a long discussion on the phone with Dr. Valetta Close at the end of last week.  He stated that no one really does optic nerve fenestrations because of the risk, especially in his case with the pallor of the optic nerve.  He stated that patient compliance was definitely an issue (had missed 4 appointments at their office) and needed to be stressed to that he needed to follow-up regularly, especially if he was off of medication.  He made an appointment for the patient in 1 month for another visual field test.  If the patient is not undergoing treatment, then the patient will need follow-up regularly.  Fortunately, when he last saw Dr. 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  I was happy to hear when I talked to the patient today, that he actually had gone back on the Topamax, 50 mg twice per day.  The patient had many questions about why he had to follow-up with both me and Dr. Valetta Close.  Explained that Dr. 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  Told him he needed to follow-up regularly with him to see if papilledema came back (especially since  he had been off of medicine for several weeks).  -pt asks about getting new glasses but explained the difference between optic nerve pallor causing inability to see and refractive issues.  Told him he could address this with Dr. Valetta Close, but I suspect that his issues with not being able to see through the glasses are optic nerve issues.  2.  Elevated CSF protein             -MRI/MRV brain neg             -cytology neg             -cx neg             -could be possiblity that due to DM in combination with spinal stenosis.  Protein a  bit high for DM alone.  Decided to hold off on further neuroimaging right now given multiple other medical problems he is dealing with.  I do think it is important that we continue to evaluate that, however.  He is very frustrated with his overall health and does not want to continue to explore other things if irrelevant, but given his hot flashes, I think it may be prudent to explore it (? Hematology c/s).  After some discussion, he was willing.  We will send referral to Dr. Mickeal Skinner.  3.  Left carotid stenosis, 60 to 79%             -Patient already sees Dr. [name] and has been referred back to vascular vein for management/consultation.  Pt aware.  Subjective   Patient seen today in follow-up for pseudotumor cerebri.  Patient saw Dr. Valetta Close on June 27, 2020.  Then we got a handwritten note, so I could not read it all, but I was able to read that the nerve was improved.  I was able to talk to Dr. 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  The patient called me recently, however, does state that he stopped the topiramate.  He was on 50 mg twice per day.  He thought it was causing to him to have headaches, hot flashes and GI upset.  These would all be very unusual side effects for topiramate.  The patient stopped it before Thanksgiving and stated that he still had side effects long after Thanksgiving.  I discussed with him that if he was attending dialysis, the side effects would not be from topiramate.  Have my staff discussed with the patient over the phone that he was really out of medical treatment options with the exception of possible shunting, which he declined even a consult for.  Dr. Valetta Close discussed with me that optic nerve fenestrations were not an option.    Current Outpatient Medications on File Prior to Visit  Medication  Sig Dispense Refill  . albuterol (VENTOLIN HFA) 108 (90 Base) MCG/ACT inhaler Inhale 2 puffs into the lungs every 6 (six) hours as needed for wheezing or shortness of breath.     . ALPRAZolam (XANAX) 0.25 MG tablet Take 0.25 mg by mouth 2 (two) times daily as needed.    . ARTIFICIAL TEAR OP Place 1 drop into both eyes every 6 (six) hours as needed (dry eyes).    Marland Kitchen atorvastatin (LIPITOR) 80 MG tablet Take 1 tablet (80 mg total) by mouth at bedtime. (Patient taking  differently: Take 80 mg by mouth daily. ) 30 tablet 5  . bismuth subsalicylate (PEPTO BISMOL) 262 MG/15ML suspension Take 30 mLs by mouth every 6 (six) hours as needed for indigestion.    . calcium acetate (PHOSLO) 667 MG capsule Take 4 capsules (2,668 mg total) by mouth 3 (three) times daily with meals. (Patient taking differently: Take 1,334-2,001 mg by mouth See admin instructions. Take 2001 mg with meals and 1334 with each snack) 360 capsule 0  . cinacalcet (SENSIPAR) 30 MG tablet Take 1 tablet (30 mg total) by mouth every evening. 60 tablet 0  . clopidogrel (PLAVIX) 75 MG tablet Take 1 tablet (75 mg total) by mouth daily. 30 tablet 0  . cyclobenzaprine (FLEXERIL) 10 MG tablet Take 10 mg by mouth 3 (three) times daily as needed.    . diphenhydrAMINE (BENADRYL) 25 MG tablet Take 25 mg by mouth daily as needed for itching.    . DULoxetine (CYMBALTA) 30 MG capsule Take 30 mg by mouth daily.    Marland Kitchen GLOBAL EASE INJECT PEN NEEDLES 32G X 4 MM MISC TO BE USED DAILY WITH INSULIN    . ibuprofen (ADVIL) 200 MG tablet Take 400 mg by mouth 3 (three) times daily.     Marland Kitchen levothyroxine (SYNTHROID) 125 MCG tablet Take 1 tablet (125 mcg total) by mouth daily before breakfast. (Patient taking differently: Take 125 mcg by mouth at bedtime. ) 30 tablet 0  . lisinopril (ZESTRIL) 10 MG tablet Take 10 mg by mouth daily.     . Melatonin 10 MG CAPS Take 10 mg by mouth at bedtime.    . multivitamin (RENA-VIT) TABS tablet Take 1 tablet by mouth daily.    .  polyethylene glycol (MIRALAX / GLYCOLAX) 17 g packet Take 17 g by mouth daily as needed for moderate constipation.    . pregabalin (LYRICA) 150 MG capsule Take 1 capsule (150 mg total) by mouth 3 (three) times daily. 90 capsule 0  . sevelamer carbonate (RENVELA) 800 MG tablet Take 1,600-3,200 mg by mouth See admin instructions. Take 3200 mg with each meal and 1600 mg with each snack    . topiramate (TOPAMAX) 25 MG tablet Take 2 tablets (50 mg total) by mouth 2 (two) times daily. To be given post dialysis, titrated as directed to patient 360 tablet 1  . TOUJEO SOLOSTAR 300 UNIT/ML SOPN Inject 54-78 Units as directed at bedtime as needed (if blood sugar is 130 or higher).     . midodrine (PROAMATINE) 10 MG tablet Take 10 mg by mouth Every Tuesday,Thursday,and Saturday with dialysis. (Patient not taking: Reported on 07/18/2020)     Current Facility-Administered Medications on File Prior to Visit  Medication Dose Route Frequency Provider Last Rate Last Admin  . 0.9 %  sodium chloride infusion  250 mL Intravenous PRN [name], MD         Objective   Vitals:   07/18/20 0937  Weight: 242 lb 8.1 oz (110 kg)  Height: 6\' 4"  (1.93 m)   GEN:  The patient appears stated age and is in NAD.    Follow up Instructions      -I discussed the assessment and treatment plan with the patient. The patient was provided an opportunity to ask questions and all were answered. The patient agreed with the plan and demonstrated an understanding of the instructions.   The patient was advised to call back or seek an in-person evaluation if the symptoms worsen or if the condition fails to  improve as anticipated.    Total time spent on today's visit was 40 minutes, including both face-to-face time and nonface-to-face time.  Time included that spent on review of records (prior notes available to me/labs/imaging if pertinent), discussing treatment and goals, answering patient's questions and coordinating  care.   Alonza Bogus, DO

## 2020-07-13 NOTE — Telephone Encounter (Signed)
Attempted to call Dr. Valetta Close to discuss case to see if optic nerve fenestrations an option. I am out of medical options given limitations with dialysis.  Pt refuses to get consult for VP shunt.  Dr. Valetta Close not available today.

## 2020-07-14 DIAGNOSIS — N186 End stage renal disease: Secondary | ICD-10-CM | POA: Diagnosis not present

## 2020-07-14 DIAGNOSIS — D509 Iron deficiency anemia, unspecified: Secondary | ICD-10-CM | POA: Diagnosis not present

## 2020-07-14 DIAGNOSIS — D631 Anemia in chronic kidney disease: Secondary | ICD-10-CM | POA: Diagnosis not present

## 2020-07-14 DIAGNOSIS — Z992 Dependence on renal dialysis: Secondary | ICD-10-CM | POA: Diagnosis not present

## 2020-07-14 DIAGNOSIS — N2581 Secondary hyperparathyroidism of renal origin: Secondary | ICD-10-CM | POA: Diagnosis not present

## 2020-07-14 NOTE — Telephone Encounter (Signed)
Spoke with Dr. Valetta Close and greatly appreciate input.  He stated that patients swelling was actually resolved when he last saw him but that the damage to the optic nerve was done and he wasn't sure that would reverse (wasn't sure the vision would get better).  He worried that stopping the topamax would cause the swelling to come back and create more irreversible damage.  He stated that no one that he knows of, including at [facility], does optic nerve fenestrations due to the risk.  He felt that the only options patient had were VP shunt, going back on topamax and if patient refused those, then needed to closely follow with him for serial VF testing.  Reported that patient compliance was a big issue and patient had missed 4 appts.  Stated that patient didn't seem to understand importance of following up even though both he and I had discussed it with him.  He was going to have his office make an appointment with patient in 1 month for another VF test.

## 2020-07-16 DIAGNOSIS — N2581 Secondary hyperparathyroidism of renal origin: Secondary | ICD-10-CM | POA: Diagnosis not present

## 2020-07-16 DIAGNOSIS — Z992 Dependence on renal dialysis: Secondary | ICD-10-CM | POA: Diagnosis not present

## 2020-07-16 DIAGNOSIS — D509 Iron deficiency anemia, unspecified: Secondary | ICD-10-CM | POA: Diagnosis not present

## 2020-07-16 DIAGNOSIS — N186 End stage renal disease: Secondary | ICD-10-CM | POA: Diagnosis not present

## 2020-07-16 DIAGNOSIS — D631 Anemia in chronic kidney disease: Secondary | ICD-10-CM | POA: Diagnosis not present

## 2020-07-18 ENCOUNTER — Encounter: Payer: Self-pay | Admitting: Neurology

## 2020-07-18 ENCOUNTER — Other Ambulatory Visit: Payer: Self-pay

## 2020-07-18 ENCOUNTER — Telehealth (INDEPENDENT_AMBULATORY_CARE_PROVIDER_SITE_OTHER): Payer: Medicare Other | Admitting: Neurology

## 2020-07-18 VITALS — Ht 76.0 in | Wt 242.5 lb

## 2020-07-18 DIAGNOSIS — R838 Other abnormal findings in cerebrospinal fluid: Secondary | ICD-10-CM | POA: Diagnosis not present

## 2020-07-18 DIAGNOSIS — Z992 Dependence on renal dialysis: Secondary | ICD-10-CM | POA: Diagnosis not present

## 2020-07-18 DIAGNOSIS — N186 End stage renal disease: Secondary | ICD-10-CM

## 2020-07-18 DIAGNOSIS — I6522 Occlusion and stenosis of left carotid artery: Secondary | ICD-10-CM

## 2020-07-18 DIAGNOSIS — G932 Benign intracranial hypertension: Secondary | ICD-10-CM

## 2020-07-18 DIAGNOSIS — R232 Flushing: Secondary | ICD-10-CM | POA: Diagnosis not present

## 2020-07-18 NOTE — Addendum Note (Signed)
Addended by: Ulice Brilliant T on: 07/18/2020 11:55 AM   Modules accepted: Orders

## 2020-07-19 ENCOUNTER — Telehealth: Payer: Self-pay | Admitting: Internal Medicine

## 2020-07-19 DIAGNOSIS — D509 Iron deficiency anemia, unspecified: Secondary | ICD-10-CM | POA: Diagnosis not present

## 2020-07-19 DIAGNOSIS — N2581 Secondary hyperparathyroidism of renal origin: Secondary | ICD-10-CM | POA: Diagnosis not present

## 2020-07-19 DIAGNOSIS — D631 Anemia in chronic kidney disease: Secondary | ICD-10-CM | POA: Diagnosis not present

## 2020-07-19 DIAGNOSIS — N186 End stage renal disease: Secondary | ICD-10-CM | POA: Diagnosis not present

## 2020-07-19 DIAGNOSIS — Z992 Dependence on renal dialysis: Secondary | ICD-10-CM | POA: Diagnosis not present

## 2020-07-19 NOTE — Telephone Encounter (Signed)
Received a new pt referral from Dr. Carles Collet at [facility] for increased cranial pressure and elevated csf. Mr. Maalouf has been scheduled to see Dr. Mickeal Skinner on 12/17 at 12pm. Appt date and time has been given Mr. Clack's wife. Aware to arrive 30 minutes early.

## 2020-07-21 DIAGNOSIS — N186 End stage renal disease: Secondary | ICD-10-CM | POA: Diagnosis not present

## 2020-07-21 DIAGNOSIS — N2581 Secondary hyperparathyroidism of renal origin: Secondary | ICD-10-CM | POA: Diagnosis not present

## 2020-07-21 DIAGNOSIS — Z992 Dependence on renal dialysis: Secondary | ICD-10-CM | POA: Diagnosis not present

## 2020-07-21 DIAGNOSIS — D509 Iron deficiency anemia, unspecified: Secondary | ICD-10-CM | POA: Diagnosis not present

## 2020-07-21 DIAGNOSIS — D631 Anemia in chronic kidney disease: Secondary | ICD-10-CM | POA: Diagnosis not present

## 2020-07-22 ENCOUNTER — Telehealth: Payer: Self-pay | Admitting: *Deleted

## 2020-07-22 NOTE — Telephone Encounter (Signed)
Received message from front desk to call this patient. He came here today thinking he had an appt toay with Dr. Mickeal Skinner. His appt is next Friday @ 12noon.  Pt and his sister requested to talk with nurse. Spoke with patient and advised that we received referral on 07/19/20 from Dr. Carles Collet and that his appt is on 07/29/20. They wanted to see Dr. Mickeal Skinner today. Advised that this was not possible.  They also had numerous questions about other non-related medical issues/concerns. They were also unclear about why Mr. dirr was seeing Dr. Mickeal Skinner. Explained reason for referral (abnormal CSF). Advised that this would be the only medical concern he will be addressing. Question regarding how pt feels after dialysis or questions related to his leg amputation would need to be addresses per those associated providers. Eventually they voiced understanding

## 2020-07-23 DIAGNOSIS — Z992 Dependence on renal dialysis: Secondary | ICD-10-CM | POA: Diagnosis not present

## 2020-07-23 DIAGNOSIS — D631 Anemia in chronic kidney disease: Secondary | ICD-10-CM | POA: Diagnosis not present

## 2020-07-23 DIAGNOSIS — N2581 Secondary hyperparathyroidism of renal origin: Secondary | ICD-10-CM | POA: Diagnosis not present

## 2020-07-23 DIAGNOSIS — N186 End stage renal disease: Secondary | ICD-10-CM | POA: Diagnosis not present

## 2020-07-23 DIAGNOSIS — D509 Iron deficiency anemia, unspecified: Secondary | ICD-10-CM | POA: Diagnosis not present

## 2020-07-26 DIAGNOSIS — D509 Iron deficiency anemia, unspecified: Secondary | ICD-10-CM | POA: Diagnosis not present

## 2020-07-26 DIAGNOSIS — D631 Anemia in chronic kidney disease: Secondary | ICD-10-CM | POA: Diagnosis not present

## 2020-07-26 DIAGNOSIS — N186 End stage renal disease: Secondary | ICD-10-CM | POA: Diagnosis not present

## 2020-07-26 DIAGNOSIS — N2581 Secondary hyperparathyroidism of renal origin: Secondary | ICD-10-CM | POA: Diagnosis not present

## 2020-07-26 DIAGNOSIS — Z992 Dependence on renal dialysis: Secondary | ICD-10-CM | POA: Diagnosis not present

## 2020-07-28 DIAGNOSIS — N186 End stage renal disease: Secondary | ICD-10-CM | POA: Diagnosis not present

## 2020-07-28 DIAGNOSIS — D509 Iron deficiency anemia, unspecified: Secondary | ICD-10-CM | POA: Diagnosis not present

## 2020-07-28 DIAGNOSIS — N2581 Secondary hyperparathyroidism of renal origin: Secondary | ICD-10-CM | POA: Diagnosis not present

## 2020-07-28 DIAGNOSIS — Z992 Dependence on renal dialysis: Secondary | ICD-10-CM | POA: Diagnosis not present

## 2020-07-28 DIAGNOSIS — D631 Anemia in chronic kidney disease: Secondary | ICD-10-CM | POA: Diagnosis not present

## 2020-07-29 ENCOUNTER — Inpatient Hospital Stay: Payer: Medicare Other | Attending: Internal Medicine | Admitting: Internal Medicine

## 2020-07-29 ENCOUNTER — Other Ambulatory Visit: Payer: Self-pay

## 2020-07-29 VITALS — BP 158/66 | HR 73 | Temp 97.9°F | Resp 18 | Ht 76.0 in

## 2020-07-29 DIAGNOSIS — I2510 Atherosclerotic heart disease of native coronary artery without angina pectoris: Secondary | ICD-10-CM | POA: Diagnosis not present

## 2020-07-29 DIAGNOSIS — H469 Unspecified optic neuritis: Secondary | ICD-10-CM | POA: Insufficient documentation

## 2020-07-29 DIAGNOSIS — H538 Other visual disturbances: Secondary | ICD-10-CM

## 2020-07-29 DIAGNOSIS — Z992 Dependence on renal dialysis: Secondary | ICD-10-CM | POA: Insufficient documentation

## 2020-07-29 DIAGNOSIS — R4701 Aphasia: Secondary | ICD-10-CM | POA: Diagnosis not present

## 2020-07-29 DIAGNOSIS — Z794 Long term (current) use of insulin: Secondary | ICD-10-CM | POA: Diagnosis not present

## 2020-07-29 DIAGNOSIS — Z89512 Acquired absence of left leg below knee: Secondary | ICD-10-CM | POA: Insufficient documentation

## 2020-07-29 DIAGNOSIS — E1122 Type 2 diabetes mellitus with diabetic chronic kidney disease: Secondary | ICD-10-CM | POA: Insufficient documentation

## 2020-07-29 DIAGNOSIS — Z888 Allergy status to other drugs, medicaments and biological substances status: Secondary | ICD-10-CM | POA: Diagnosis not present

## 2020-07-29 DIAGNOSIS — I739 Peripheral vascular disease, unspecified: Secondary | ICD-10-CM | POA: Diagnosis not present

## 2020-07-29 DIAGNOSIS — N186 End stage renal disease: Secondary | ICD-10-CM | POA: Diagnosis not present

## 2020-07-29 DIAGNOSIS — R29818 Other symptoms and signs involving the nervous system: Secondary | ICD-10-CM

## 2020-07-29 DIAGNOSIS — R609 Edema, unspecified: Secondary | ICD-10-CM | POA: Diagnosis not present

## 2020-07-29 DIAGNOSIS — Z885 Allergy status to narcotic agent status: Secondary | ICD-10-CM | POA: Insufficient documentation

## 2020-07-29 DIAGNOSIS — Z87442 Personal history of urinary calculi: Secondary | ICD-10-CM | POA: Diagnosis not present

## 2020-07-29 DIAGNOSIS — Z8249 Family history of ischemic heart disease and other diseases of the circulatory system: Secondary | ICD-10-CM | POA: Diagnosis not present

## 2020-07-29 DIAGNOSIS — Z89511 Acquired absence of right leg below knee: Secondary | ICD-10-CM

## 2020-07-29 DIAGNOSIS — Z87891 Personal history of nicotine dependence: Secondary | ICD-10-CM | POA: Diagnosis not present

## 2020-07-29 DIAGNOSIS — Z79899 Other long term (current) drug therapy: Secondary | ICD-10-CM | POA: Diagnosis not present

## 2020-07-29 DIAGNOSIS — R4182 Altered mental status, unspecified: Secondary | ICD-10-CM

## 2020-07-29 DIAGNOSIS — M792 Neuralgia and neuritis, unspecified: Secondary | ICD-10-CM

## 2020-07-29 MED ORDER — LIDOCAINE 5 % EX OINT: 1.0000 "application " | TOPICAL_OINTMENT | CUTANEOUS | 0 refills | Status: DC | PRN

## 2020-07-29 NOTE — Progress Notes (Signed)
[facility] at [place], [place] [address] [phone]   New Patient Evaluation  Date of Service: 07/29/20 Patient Name: [name] Patient MRN: [id number] Patient DOB: 10-09-1950 Provider: Ventura Sellers, MD  Identifying Statement:  [name] is a 69 y.o. male with optic neuropathy who presents for initial consultation and evaluation regarding neurologic deficits.    Referring Provider: Caryl Bis, MD [place],  [place] [id number]   History of Present Illness: The patient's records from the referring physician were obtained and reviewed and the patient interviewed to confirm this HPI.  [name] presented to medical attention in August 2021 with sudden onset right eye visual impairment.  He describes going to bed "normal" and waking up with "lines through my vision on the right, blurriness as well in certain parts of the eye".  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  Referral to neurology yielded further workup, including CT head, MRI/MRV, lumbar puncture, CSF analysis.  He was started on Topamax in September.  He describes stability or slight improvement in his right eye in recent weeks/months, this is apparently confirmed with his eye doctor although no records of such are available for review.  He carries a high burden of medical comorbidity, with right BKA, PAD in left leg with recent endarterectomy, renal failure with hemodialysis.  Complains of severe bursts of pain arising from left BKA stump, Lyrica, cymbalta, topiramate have not been effective even in conjunction.   Medications: Current Outpatient Medications on File Prior to Visit  Medication Sig Dispense Refill  . albuterol (VENTOLIN HFA) 108 (90 Base) MCG/ACT inhaler Inhale 2 puffs into the lungs every 6 (six) hours as needed for wheezing or shortness of breath.     . ALPRAZolam (XANAX) 0.25 MG  tablet Take 0.25 mg by mouth 2 (two) times daily as needed.    . ARTIFICIAL TEAR OP Place 1 drop into both eyes every 6 (six) hours as needed (dry eyes).    Marland Kitchen atorvastatin (LIPITOR) 80 MG tablet Take 1 tablet (80 mg total) by mouth at bedtime. (Patient taking differently: Take 80 mg by mouth daily. ) 30 tablet 5  . bismuth subsalicylate (PEPTO BISMOL) 262 MG/15ML suspension Take 30 mLs by mouth every 6 (six) hours as needed for indigestion.    . calcium acetate (PHOSLO) 667 MG capsule Take 4 capsules (2,668 mg total) by mouth 3 (three) times daily with meals. (Patient taking differently: Take 1,334-2,001 mg by mouth See admin instructions. Take 2001 mg with meals and 1334 with each snack) 360 capsule 0  . cinacalcet (SENSIPAR) 30 MG tablet Take 1 tablet (30 mg total) by mouth every evening. 60 tablet 0  . clopidogrel (PLAVIX) 75 MG tablet Take 1 tablet (75 mg total) by mouth daily. 30 tablet 0  . cyclobenzaprine (FLEXERIL) 10 MG tablet Take 10 mg by mouth 3 (three) times daily as needed.    . diphenhydrAMINE (BENADRYL) 25 MG tablet Take 25 mg by mouth daily as needed for itching.    . DULoxetine (CYMBALTA) 30 MG capsule Take 30 mg by mouth daily.    Marland Kitchen GLOBAL EASE INJECT PEN NEEDLES 32G X 4 MM MISC TO BE USED DAILY WITH INSULIN    . ibuprofen (ADVIL) 200 MG tablet Take 400 mg by mouth 3 (three) times daily.     Marland Kitchen levothyroxine (SYNTHROID) 125 MCG tablet Take 1  tablet (125 mcg total) by mouth daily before breakfast. (Patient taking differently: Take 125 mcg by mouth at bedtime. ) 30 tablet 0  . lisinopril (ZESTRIL) 10 MG tablet Take 10 mg by mouth daily.     . Melatonin 10 MG CAPS Take 10 mg by mouth at bedtime.    . midodrine (PROAMATINE) 10 MG tablet Take 10 mg by mouth Every Tuesday,Thursday,and Saturday with dialysis. (Patient not taking: Reported on 07/18/2020)    . multivitamin (RENA-VIT) TABS tablet Take 1 tablet by mouth daily.    . polyethylene glycol (MIRALAX / GLYCOLAX) 17 g packet Take 17 g  by mouth daily as needed for moderate constipation.    . pregabalin (LYRICA) 150 MG capsule Take 1 capsule (150 mg total) by mouth 3 (three) times daily. 90 capsule 0  . sevelamer carbonate (RENVELA) 800 MG tablet Take 1,600-3,200 mg by mouth See admin instructions. Take 3200 mg with each meal and 1600 mg with each snack    . topiramate (TOPAMAX) 25 MG tablet Take 2 tablets (50 mg total) by mouth 2 (two) times daily. To be given post dialysis, titrated as directed to patient 360 tablet 1  . TOUJEO SOLOSTAR 300 UNIT/ML SOPN Inject 54-78 Units as directed at bedtime as needed (if blood sugar is 130 or higher).      Current Facility-Administered Medications on File Prior to Visit  Medication Dose Route Frequency Provider Last Rate Last Admin  . 0.9 %  sodium chloride infusion  250 mL Intravenous PRN [name], MD        Allergies:  Allergies  Allergen Reactions  . Ace Inhibitors     Unknown reaction, tolerates lisinopril   . Clonidine     Unknown reaction   . Morphine And Related Other (See Comments)    Not effective  . Oxytocin     unknown   Past Medical History:  Past Medical History:  Diagnosis Date  . Anemia   . Anxiety   . Arthritis   . CAD (coronary artery disease)    Myoview August 2021 with evidence of large inferior scar but no active ischemia  . COPD (chronic obstructive pulmonary disease) ([facility])   . Depression   . ESRD on hemodialysis ([facility])   . Essential hypertension   . GERD (gastroesophageal reflux disease)   . H/O hiatal hernia   . Headache(784.0)   . History of kidney stones   . History of pneumonia   . Hypothyroidism   . Neuropathy   . Non-healing wound of amputation stump ([facility])    Right  . Peripheral vascular disease ([facility])   . PTSD (post-traumatic stress disorder)   . Type 2 diabetes mellitus ([facility])    Past Surgical History:  Past Surgical History:  Procedure Laterality Date  . A/V FISTULAGRAM Left 01/16/2019   Procedure: A/V FISTULAGRAM;   Surgeon: [name], MD;  Location: [facility];  Service: Cardiovascular;  Laterality: Left;  . ABDOMINAL AORTOGRAM W/LOWER EXTREMITY Bilateral 03/18/2020   Procedure: ABDOMINAL AORTOGRAM W/LOWER EXTREMITY;  Surgeon: [name], MD;  Location: [facility];  Service: Cardiovascular;  Laterality: Bilateral;  . AMPUTATION Right 12/01/2018   Procedure: RIGHT AMPUTATION BELOW KNEE;  Surgeon: [name], MD;  Location: [facility];  Service: Vascular;  Laterality: Right;  . AMPUTATION Right 04/27/2019   Procedure: AMPUTATION BELOW KNEE REVISION;  Surgeon: [name], MD;  Location: [facility] OR;  Service: Vascular;  Laterality: Right;  . AMPUTATION Right  04/30/2019   Procedure: AMPUTATION ABOVE KNEE - RIGHT;  Surgeon: Waynetta Sandy, MD;  Location: [facility];  Service: Vascular;  Laterality: Right;  . APPLICATION OF WOUND VAC Right 04/27/2019   Procedure: APPLICATION OF WOUND VAC;  Surgeon: [name], MD;  Location: [facility];  Service: Vascular;  Laterality: Right;  . AV FISTULA PLACEMENT  2012      left arm   . AV FISTULA PLACEMENT Left 12/18/2012   Procedure: ARTERIOVENOUS (AV) FISTULA CREATION;  Surgeon: Angelia Mould, MD;  Location: [facility];  Service: Vascular;  Laterality: Left;  . COLONOSCOPY  10/26/2011   Procedure: COLONOSCOPY;  Surgeon: Rogene Houston, MD;  [facility];  Service: Endoscopy;  Laterality: N/A;  730  . EMBOLECTOMY Left 12/09/2012   Procedure: EMBOLECTOMY;  Surgeon: Serafina Mitchell, MD;  Location: [facility] C[facility] LAB;  Service: Cardiovascular;  Laterality: Left;  left arm venous embolization  . ENDARTERECTOMY FEMORAL Left 04/04/2020   Procedure: ENDARTERECTOMY COMMON FEMORAL;  Surgeon: [name], MD;  Location: [facility] OR;  Service: Vascular;  Laterality: Left;  . ESOPHAGOGASTRODUODENOSCOPY (EGD) WITH ESOPHAGEAL DILATION N/A 04/23/2013   Procedure: ESOPHAGOGASTRODUODENOSCOPY (EGD) WITH ESOPHAGEAL DILATION;  Surgeon: Rogene Houston,  MD;  [facility];  Service: Endoscopy;  Laterality: N/A;  200-moved to 930   . FEMORAL-POPLITEAL BYPASS GRAFT Left 04/04/2020   Procedure: FEMORAL- BELOW KNEE POPLITEAL BYPASS GRAFT NON REVERSED VEIN;  Surgeon: [name], MD;  Location: [facility];  Service: Vascular;  Laterality: Left;  . FISTULA SUPERFICIALIZATION Left 06/18/2013   Procedure: FISTULA SUPERFICIALIZATION & LIGATION BRANCH X 1;  Surgeon: Mal Misty, MD;  Location: [facility];  Service: Vascular;  Laterality: Left;  . GROIN DEBRIDEMENT Left 04/29/2020   Procedure: EXPLORATION LEFT GROIN WITH DEBRIDEMENT;  Surgeon: Rosetta Posner, MD;  Location: [facility];  Service: Vascular;  Laterality: Left;  . HEMATOMA EVACUATION Right 02/11/2017   Procedure: EVACUATION HEMATOMA RIGHT GROIN, Repair of Right Pseudo-anerysm.;  Surgeon: [name], MD;  [facility];  Service: Vascular;  Laterality: Right;  . INGUINAL HERNIA REPAIR     ,  times   2  . INSERTION OF DIALYSIS CATHETER Left 12/18/2012   Procedure: INSERTION OF DIALYSIS CATHETER;  Surgeon: Angelia Mould, MD;  Location: [facility];  Service: Vascular;  Laterality: Left;  . KNEE ARTHROSCOPY  2011   Right Knee  . LOWER EXTREMITY ANGIOGRAPHY N/A 02/11/2017   Procedure: Lower Extremity Angiography;  Surgeon: Lorretta Harp, MD;  Location: [facility];  Service: Cardiovascular;  Laterality: N/A;  . PERIPHERAL ATHRECTOMY  02/11/2017  . PERIPHERAL VASCULAR ATHERECTOMY Left 02/11/2017   Procedure: Peripheral Vascular Atherectomy;  Surgeon: Lorretta Harp, MD;  Location: [facility];  Service: Cardiovascular;  Laterality: Left;  . PERIPHERAL VASCULAR BALLOON ANGIOPLASTY Left 01/16/2019   Procedure: PERIPHERAL VASCULAR BALLOON ANGIOPLASTY;  Surgeon: [name], MD;  Location: [facility];  Service: Cardiovascular;  Laterality: Left;  central vein  . REVISON OF ARTERIOVENOUS FISTULA Left 6/62/9476   Procedure: PLICATION OF LEFT BRACHIOCEPHALIC  ARTERIOVENOUS FISTULA;  Surgeon: Conrad Buena Vista, MD;  Location: [facility];  Service: Vascular;  Laterality: Left;  . SHUNTOGRAM N/A 12/09/2012   Procedure: fistulogram;  Surgeon: Serafina Mitchell, MD;  Location: [facility] CATH LAB;  Service: Cardiovascular;  Laterality: N/A;  . SHUNTOGRAM Left 06/03/2013   Procedure: Fistulogram;  Surgeon: Serafina Mitchell, MD;  Location: [facility] CATH LAB;  Service:  Cardiovascular;  Laterality: Left;  . THROMBECTOMY W/ EMBOLECTOMY Left 12/11/2012   Procedure: THROMBECTOMY ARTERIOVENOUS FISTULA;  Surgeon: Serafina Mitchell, MD;  Location: [place];  Service: Vascular;  Laterality: Left;  . TONSILLECTOMY    . WOUND DEBRIDEMENT Right 02/11/2019   Procedure: DEBRIDEMENT WOUND RIGHT BELOW THE KNEE STUMP;  Surgeon: Serafina Mitchell, MD;  Location: [facility] OR;  Service: Vascular;  Laterality: Right;   Social History:  Social History   Socioeconomic History  . Marital status: Married    Spouse name: [name]  . Number of children: Not on file  . Years of education: Not on file  . Highest education level: Not on file  Occupational History  . Occupation: retired  Tobacco Use  . Smoking status: Former Smoker    Packs/day: 0.50    Years: 18.00    Pack years: 9.00    Types: Cigarettes    Quit date: 06/27/2020    Years since quitting: 0.0  . Smokeless tobacco: Never Used  Vaping Use  . Vaping Use: Never used  Substance and Sexual Activity  . Alcohol use: No    Alcohol/week: 0.0 standard drinks  . Drug use: Yes    Types: Marijuana    Comment: smokes everyday for pain relief  . Sexual activity: Not on file  Other Topics Concern  . Not on file  Social History Narrative   Mr Fails is a 69 year old retired patient who lives with wife [name], his primary caregiver. He reports he is independent/assist with his care needs    He has transportation to medical appointments   Social Determinants of Health   Financial Resource Strain: Not on file  Food Insecurity: Not on file  Transportation  Needs: Not on file  Physical Activity: Not on file  Stress: Not on file  Social Connections: Not on file  Intimate Partner Violence: Not on file   Family History:  Family History  Problem Relation Age of Onset  . Heart disease Father        Heart Disease before age 73  . Healthy Daughter   . Healthy Daughter     Review of Systems: Constitutional: Doesn't report fevers, chills or abnormal weight loss Eyes: Doesn't report blurriness of vision Ears, nose, mouth, throat, and face: Doesn't report sore throat Respiratory: Doesn't report cough, dyspnea or wheezes Cardiovascular: Doesn't report palpitation, chest discomfort  Gastrointestinal:  Doesn't report nausea, constipation, diarrhea GU: Doesn't report incontinence Skin: Doesn't report skin rashes Neurological: Per HPI Musculoskeletal: +pain left knee Behavioral/Psych: Doesn't report anxiety  Physical Exam: Vitals:   07/29/20 1156  BP: (!) 158/66  Pulse: 73  Resp: 18  Temp: 97.9 F (36.6 C)  SpO2: 100%   KPS: 60. General: Alert, cooperative, pleasant, in distress from leg pain.  In wheelchair Head: Normal EENT: No conjunctival injection or scleral icterus.  Lungs: Resp effort normal Cardiac: Regular rate Abdomen: +distended abdomen Skin: No rashes cyanosis or petechiae. Extremities: Right BKA  Neurologic Exam: Mental Status: Awake, alert, attentive to examiner. Oriented to self and environment. Language is fluent with intact comprehension.  Cranial Nerves: Visual acuity is grossly normal. Visual fields are full. Extra-ocular movements intact. No ptosis. Face is symmetric Motor: Tone and bulk are normal. Power is full in both arms and legs. Reflexes are symmetric, no pathologic reflexes present.  Sensory: Intact to light touch Gait: Non ambulatory   Labs: I have reviewed the data as listed    Component Value Date/Time   NA 139  04/29/2020 1113   NA 134 02/08/2017 1403   K 4.2 04/29/2020 1113   CL 99  04/29/2020 1113   CO2 27 04/07/2020 0248   GLUCOSE 163 (H) 04/29/2020 1113   BUN 37 (H) 04/29/2020 1113   BUN 67 (H) 02/08/2017 1403   CREATININE 6.70 (H) 04/29/2020 1113   CALCIUM 9.3 04/07/2020 0248   PROT 6.7 04/01/2020 1438   ALBUMIN 3.7 04/01/2020 1438   AST 19 04/01/2020 1438   ALT 17 04/01/2020 1438   ALKPHOS 65 04/01/2020 1438   BILITOT 0.3 04/01/2020 1438   GFRNONAA 5 (L) 04/07/2020 0248   GFRAA 6 (L) 04/07/2020 0248   Lab Results  Component Value Date   WBC 5.6 04/07/2020   NEUTROABS 6.9 12/11/2018   HGB 8.8 (L) 04/29/2020   HCT 26.0 (L) 04/29/2020   MCV 105.5 (H) 04/07/2020   PLT 148 (L) 04/07/2020    Imaging:  CLINICAL DATA:  Altered mental status with confusion and expressive aphasia.  EXAM: CT HEAD WITHOUT CONTRAST  TECHNIQUE: Contiguous axial images were obtained from the base of the skull through the vertex without intravenous contrast.  COMPARISON:  08/22/2016  FINDINGS: Brain: Ventricles, cisterns and other CSF spaces are normal. There is no mass, mass effect, shift of midline structures or acute hemorrhage. No evidence of acute infarction.  Vascular: No hyperdense vessel or unexpected calcification.  Skull: Normal. Negative for fracture or focal lesion.  Sinuses/Orbits: No acute finding.  Other: None.  IMPRESSION: No acute findings.   Electronically Signed   By: Marin Olp M.D.   On: 04/29/2019 10:27   Assessment/Plan Optic Neuropathy  [name] present with clinical and radiographic syndrome most consistent with right optic neuropathy.  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  He has extensive vasculopathy throughout different organ systems, both large and small vessel findings.  Opening pressure was elevated at 230mm H2O, but this is of unclear significance, and is not far from normal CSF pressure.  The complete  lack of headaches, as well as sudden onset of monocular symptoms, make IIH less likely in our opinion.   We recommended a trial of cessation of Topiramate, as he has not tolerated it well and it has not clearly improved his visual symptoms.  If vision declines in association with cessation of Topiramate this makes IIH more likely.  He is not a good candidate for higher risk interventions such as ONSF and shunting given uncertainty surrounding pathology, high surgical risk given comorbid state.  His stump pain is severe and has been refractory to multiple neuropathic pain agents.  We recommended trial of lidocaine 5% ointment, and we will place referral to Dr. Gean Quint at [facility] for consideration of localized nerve block.     We spent twenty additional minutes teaching regarding the natural history, biology, and historical experience in the treatment of neurologic disorders.  We appreciate the opportunity to participate in the care of DILLIAN FEIG.  He may follow up as needed.  All questions were answered. The patient knows to call the clinic with any problems, questions or concerns. No barriers to learning were detected.  The total time spent in the encounter was 60 minutes and more than 50% was on counseling and review of test results   Ventura Sellers, MD Medical Director of Neuro-Oncology [facility] at [place] 07/29/20 4:07 PM

## 2020-07-30 DIAGNOSIS — N186 End stage renal disease: Secondary | ICD-10-CM | POA: Diagnosis not present

## 2020-07-30 DIAGNOSIS — D631 Anemia in chronic kidney disease: Secondary | ICD-10-CM | POA: Diagnosis not present

## 2020-07-30 DIAGNOSIS — Z992 Dependence on renal dialysis: Secondary | ICD-10-CM | POA: Diagnosis not present

## 2020-07-30 DIAGNOSIS — N2581 Secondary hyperparathyroidism of renal origin: Secondary | ICD-10-CM | POA: Diagnosis not present

## 2020-07-30 DIAGNOSIS — D509 Iron deficiency anemia, unspecified: Secondary | ICD-10-CM | POA: Diagnosis not present

## 2020-08-02 DIAGNOSIS — N2581 Secondary hyperparathyroidism of renal origin: Secondary | ICD-10-CM | POA: Diagnosis not present

## 2020-08-02 DIAGNOSIS — Z992 Dependence on renal dialysis: Secondary | ICD-10-CM | POA: Diagnosis not present

## 2020-08-02 DIAGNOSIS — Z23 Encounter for immunization: Secondary | ICD-10-CM | POA: Diagnosis not present

## 2020-08-02 DIAGNOSIS — D509 Iron deficiency anemia, unspecified: Secondary | ICD-10-CM | POA: Diagnosis not present

## 2020-08-02 DIAGNOSIS — D631 Anemia in chronic kidney disease: Secondary | ICD-10-CM | POA: Diagnosis not present

## 2020-08-02 DIAGNOSIS — N186 End stage renal disease: Secondary | ICD-10-CM | POA: Diagnosis not present

## 2020-08-04 DIAGNOSIS — D509 Iron deficiency anemia, unspecified: Secondary | ICD-10-CM | POA: Diagnosis not present

## 2020-08-04 DIAGNOSIS — D631 Anemia in chronic kidney disease: Secondary | ICD-10-CM | POA: Diagnosis not present

## 2020-08-04 DIAGNOSIS — N186 End stage renal disease: Secondary | ICD-10-CM | POA: Diagnosis not present

## 2020-08-04 DIAGNOSIS — Z992 Dependence on renal dialysis: Secondary | ICD-10-CM | POA: Diagnosis not present

## 2020-08-04 DIAGNOSIS — N2581 Secondary hyperparathyroidism of renal origin: Secondary | ICD-10-CM | POA: Diagnosis not present

## 2020-08-07 DIAGNOSIS — D509 Iron deficiency anemia, unspecified: Secondary | ICD-10-CM | POA: Diagnosis not present

## 2020-08-07 DIAGNOSIS — N2581 Secondary hyperparathyroidism of renal origin: Secondary | ICD-10-CM | POA: Diagnosis not present

## 2020-08-07 DIAGNOSIS — D631 Anemia in chronic kidney disease: Secondary | ICD-10-CM | POA: Diagnosis not present

## 2020-08-07 DIAGNOSIS — N186 End stage renal disease: Secondary | ICD-10-CM | POA: Diagnosis not present

## 2020-08-07 DIAGNOSIS — Z992 Dependence on renal dialysis: Secondary | ICD-10-CM | POA: Diagnosis not present

## 2020-08-09 ENCOUNTER — Telehealth: Payer: Self-pay | Admitting: Neurology

## 2020-08-09 DIAGNOSIS — N2581 Secondary hyperparathyroidism of renal origin: Secondary | ICD-10-CM | POA: Diagnosis not present

## 2020-08-09 DIAGNOSIS — Z794 Long term (current) use of insulin: Secondary | ICD-10-CM | POA: Diagnosis not present

## 2020-08-09 DIAGNOSIS — E119 Type 2 diabetes mellitus without complications: Secondary | ICD-10-CM | POA: Diagnosis not present

## 2020-08-09 DIAGNOSIS — D509 Iron deficiency anemia, unspecified: Secondary | ICD-10-CM | POA: Diagnosis not present

## 2020-08-09 DIAGNOSIS — D631 Anemia in chronic kidney disease: Secondary | ICD-10-CM | POA: Diagnosis not present

## 2020-08-09 DIAGNOSIS — Z992 Dependence on renal dialysis: Secondary | ICD-10-CM | POA: Diagnosis not present

## 2020-08-09 DIAGNOSIS — N186 End stage renal disease: Secondary | ICD-10-CM | POA: Diagnosis not present

## 2020-08-09 NOTE — Telephone Encounter (Signed)
Patient called and requested a cal back with the name of the optometrist Dr. Carles Collet recommended.

## 2020-08-09 NOTE — Telephone Encounter (Signed)
Patient called back asking about optometrist name and number. Reviewed Dr Doristine Devoid last visit note and was able to get optometrist name Dr Valetta Close, and searched number online. This encounter can be closed.

## 2020-08-09 NOTE — Telephone Encounter (Signed)
Will have to hold an ask Tee.

## 2020-08-11 ENCOUNTER — Ambulatory Visit (INDEPENDENT_AMBULATORY_CARE_PROVIDER_SITE_OTHER)
Admission: RE | Admit: 2020-08-11 | Discharge: 2020-08-11 | Disposition: A | Discharge status: Home / Self Care | Payer: Medicare Other | Source: Ambulatory Visit | Attending: Vascular Surgery | Admitting: Vascular Surgery

## 2020-08-11 ENCOUNTER — Ambulatory Visit (HOSPITAL_COMMUNITY)
Admission: RE | Admit: 2020-08-11 | Discharge: 2020-08-11 | Disposition: A | Discharge status: Home / Self Care | Payer: Medicare Other | Source: Ambulatory Visit | Attending: Vascular Surgery | Admitting: Vascular Surgery

## 2020-08-11 ENCOUNTER — Other Ambulatory Visit: Payer: Self-pay

## 2020-08-11 ENCOUNTER — Ambulatory Visit (INDEPENDENT_AMBULATORY_CARE_PROVIDER_SITE_OTHER): Payer: Medicare Other | Admitting: Vascular Surgery

## 2020-08-11 ENCOUNTER — Encounter: Payer: Self-pay | Admitting: Vascular Surgery

## 2020-08-11 VITALS — BP 163/81 | HR 71 | Temp 97.7°F | Resp 20 | Ht 76.0 in | Wt 240.5 lb

## 2020-08-11 DIAGNOSIS — D631 Anemia in chronic kidney disease: Secondary | ICD-10-CM | POA: Diagnosis not present

## 2020-08-11 DIAGNOSIS — Z992 Dependence on renal dialysis: Secondary | ICD-10-CM | POA: Diagnosis not present

## 2020-08-11 DIAGNOSIS — I739 Peripheral vascular disease, unspecified: Secondary | ICD-10-CM

## 2020-08-11 DIAGNOSIS — D509 Iron deficiency anemia, unspecified: Secondary | ICD-10-CM | POA: Diagnosis not present

## 2020-08-11 DIAGNOSIS — I6522 Occlusion and stenosis of left carotid artery: Secondary | ICD-10-CM

## 2020-08-11 DIAGNOSIS — N2581 Secondary hyperparathyroidism of renal origin: Secondary | ICD-10-CM | POA: Diagnosis not present

## 2020-08-11 DIAGNOSIS — N186 End stage renal disease: Secondary | ICD-10-CM | POA: Diagnosis not present

## 2020-08-11 NOTE — Progress Notes (Signed)
Patient is a 69 year old male who returns for follow-up today.  He was last seen September 2021.  We had been following him for carotid and peripheral arterial disease.  He underwent left femoral endarterectomy left femoral to below-knee popliteal bypass with vein.  This was done for a nonhealing wound on his left leg primarily on the heel.  He has had a prior right above-knee amputation.  We have also been following him for a moderate carotid stenosis.  He has not had any symptoms recently of TIA amaurosis or stroke.  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  Past Medical History:  Diagnosis Date  . Anemia   . Anxiety   . Arthritis   . CAD (coronary artery disease)    Myoview August 2021 with evidence of large inferior scar but no active ischemia  . COPD (chronic obstructive pulmonary disease) ([place])   . Depression   . ESRD on hemodialysis ([place])   . Essential hypertension   . GERD (gastroesophageal reflux disease)   . H/O hiatal hernia   . Headache(784.0)   . History of kidney stones   . History of pneumonia   . Hypothyroidism   . Neuropathy   . Non-healing wound of amputation stump ([facility])    Right  . Peripheral vascular disease ([place])   . PTSD (post-traumatic stress disorder)   . Type 2 diabetes mellitus ([place])     Past Surgical History:  Procedure Laterality Date  . A/V FISTULAGRAM Left 01/16/2019   Procedure: A/V FISTULAGRAM;  Surgeon: [name], MD;  Location: [place] [facility];  Service: Cardiovascular;  Laterality: Left;  . ABDOMINAL AORTOGRAM W/LOWER EXTREMITY Bilateral 03/18/2020   Procedure: ABDOMINAL AORTOGRAM W/LOWER EXTREMITY;  Surgeon: [name], MD;  Location: [place] [facility];  Service: Cardiovascular;  Laterality: Bilateral;  . AMPUTATION Right 12/01/2018   Procedure: RIGHT AMPUTATION BELOW KNEE;  Surgeon: [name], MD;  Location: [facility] OR;  Service: Vascular;  Laterality: Right;  . AMPUTATION Right  04/27/2019   Procedure: AMPUTATION BELOW KNEE REVISION;  Surgeon: [name], MD;  Location: [place];  Service: Vascular;  Laterality: Right;  . AMPUTATION Right 04/30/2019   Procedure: AMPUTATION ABOVE KNEE - RIGHT;  Surgeon: Waynetta Sandy, MD;  Location: [place];  Service: Vascular;  Laterality: Right;  . APPLICATION OF WOUND VAC Right 04/27/2019   Procedure: APPLICATION OF WOUND VAC;  Surgeon: [name], MD;  Location: [place];  Service: Vascular;  Laterality: Right;  . AV FISTULA PLACEMENT  2012      left arm   . AV FISTULA PLACEMENT Left 12/18/2012   Procedure: ARTERIOVENOUS (AV) FISTULA CREATION;  Surgeon: Angelia Mould, MD;  Location: [place];  Service: Vascular;  Laterality: Left;  . COLONOSCOPY  10/26/2011   Procedure: COLONOSCOPY;  Surgeon: Rogene Houston, MD;  [facility];  Service: Endoscopy;  Laterality: N/A;  730  . EMBOLECTOMY Left 12/09/2012   Procedure: EMBOLECTOMY;  Surgeon: Serafina Mitchell, MD;  Location: [facility] CATH LAB;  Service: Cardiovascular;  Laterality: Left;  left arm venous embolization  . ENDARTERECTOMY FEMORAL Left 04/04/2020   Procedure: ENDARTERECTOMY COMMON FEMORAL;  Surgeon: [name], MD;  Location: [facility] OR;  Service: Vascular;  Laterality: Left;  . ESOPHAGOGASTRODUODENOSCOPY (EGD) WITH ESOPHAGEAL DILATION N/A 04/23/2013   Procedure: ESOPHAGOGASTRODUODENOSCOPY (EGD) WITH ESOPHAGEAL DILATION;  Surgeon: Rogene Houston, MD;  [facility];  Service: Endoscopy;  Laterality: N/A;  200-moved to 930   . FEMORAL-POPLITEAL BYPASS GRAFT Left 04/04/2020   Procedure: FEMORAL- BELOW KNEE POPLITEAL BYPASS GRAFT NON REVERSED VEIN;  Surgeon: [name], MD;  Location: [place];  Service: Vascular;  Laterality: Left;  . FISTULA SUPERFICIALIZATION Left 06/18/2013   Procedure: FISTULA SUPERFICIALIZATION & LIGATION BRANCH X 1;  Surgeon: Mal Misty, MD;  Location: [place];  Service: Vascular;  Laterality: Left;  . GROIN DEBRIDEMENT  Left 04/29/2020   Procedure: EXPLORATION LEFT GROIN WITH DEBRIDEMENT;  Surgeon: Rosetta Posner, MD;  Location: [place];  Service: Vascular;  Laterality: Left;  . HEMATOMA EVACUATION Right 02/11/2017   Procedure: EVACUATION HEMATOMA RIGHT GROIN, Repair of Right Pseudo-anerysm.;  Surgeon: [name], MD;  Location: [facility];  Service: Vascular;  Laterality: Right;  . INGUINAL HERNIA REPAIR     ,  times   2  . INSERTION OF DIALYSIS CATHETER Left 12/18/2012   Procedure: INSERTION OF DIALYSIS CATHETER;  Surgeon: Angelia Mould, MD;  Location: [place];  Service: Vascular;  Laterality: Left;  . KNEE ARTHROSCOPY  2011   Right Knee  . LOWER EXTREMITY ANGIOGRAPHY N/A 02/11/2017   Procedure: Lower Extremity Angiography;  Surgeon: Lorretta Harp, MD;  Location: [place] [facility];  Service: Cardiovascular;  Laterality: N/A;  . PERIPHERAL ATHRECTOMY  02/11/2017  . PERIPHERAL VASCULAR ATHERECTOMY Left 02/11/2017   Procedure: Peripheral Vascular Atherectomy;  Surgeon: Lorretta Harp, MD;  Location: [place] [facility];  Service: Cardiovascular;  Laterality: Left;  . PERIPHERAL VASCULAR BALLOON ANGIOPLASTY Left 01/16/2019   Procedure: PERIPHERAL VASCULAR BALLOON ANGIOPLASTY;  Surgeon: [name], MD;  Location: [place] [facility];  Service: Cardiovascular;  Laterality: Left;  central vein  . REVISON OF ARTERIOVENOUS FISTULA Left 10/11/6008   Procedure: PLICATION OF LEFT BRACHIOCEPHALIC ARTERIOVENOUS FISTULA;  Surgeon: Conrad Harper, MD;  Location: [place];  Service: Vascular;  Laterality: Left;  . SHUNTOGRAM N/A 12/09/2012   Procedure: fistulogram;  Surgeon: Serafina Mitchell, MD;  Location: [facility] CATH LAB;  Service: Cardiovascular;  Laterality: N/A;  . SHUNTOGRAM Left 06/03/2013   Procedure: Fistulogram;  Surgeon: Serafina Mitchell, MD;  Location: [facility] CATH LAB;  Service: Cardiovascular;  Laterality: Left;  . THROMBECTOMY W/ EMBOLECTOMY Left 12/11/2012   Procedure: THROMBECTOMY ARTERIOVENOUS FISTULA;  Surgeon: Serafina Mitchell, MD;  Location: [place];  Service: Vascular;  Laterality: Left;  . TONSILLECTOMY    . WOUND DEBRIDEMENT Right 02/11/2019   Procedure: DEBRIDEMENT WOUND RIGHT BELOW THE KNEE STUMP;  Surgeon: Serafina Mitchell, MD;  Location: [facility] OR;  Service: Vascular;  Laterality: Right;    Current Outpatient Medications on File Prior to Visit  Medication Sig Dispense Refill  . albuterol (VENTOLIN HFA) 108 (90 Base) MCG/ACT inhaler Inhale 2 puffs into the lungs every 6 (six) hours as needed for wheezing or shortness of breath.     . ALPRAZolam (XANAX) 0.25 MG tablet Take 0.25 mg by mouth 2 (two) times daily as needed.    . ARTIFICIAL TEAR OP Place 1 drop into both eyes every 6 (six) hours as needed (dry eyes).    Marland Kitchen atorvastatin (LIPITOR) 80 MG tablet Take 1 tablet (80 mg total) by mouth at bedtime. (Patient taking differently: Take 80 mg by mouth daily.) 30 tablet 5  . bismuth subsalicylate (PEPTO BISMOL) 262 MG/15ML suspension Take 30 mLs by mouth every 6 (six) hours as needed for indigestion.    . calcium acetate (PHOSLO) 667 MG capsule Take  4 capsules (2,668 mg total) by mouth 3 (three) times daily with meals. (Patient taking differently: Take 1,334-2,001 mg by mouth See admin instructions. Take 2001 mg with meals and 1334 with each snack) 360 capsule 0  . cinacalcet (SENSIPAR) 30 MG tablet Take 1 tablet (30 mg total) by mouth every evening. 60 tablet 0  . clopidogrel (PLAVIX) 75 MG tablet Take 1 tablet (75 mg total) by mouth daily. 30 tablet 0  . cyclobenzaprine (FLEXERIL) 10 MG tablet Take 10 mg by mouth 3 (three) times daily as needed.    . diphenhydrAMINE (BENADRYL) 25 MG tablet Take 25 mg by mouth daily as needed for itching.    . DULoxetine (CYMBALTA) 30 MG capsule Take 30 mg by mouth daily.    Marland Kitchen GLOBAL EASE INJECT PEN NEEDLES 32G X 4 MM MISC TO BE USED DAILY WITH INSULIN    . ibuprofen (ADVIL) 200 MG tablet Take 400 mg by mouth 3 (three) times daily.    Marland Kitchen levothyroxine (SYNTHROID) 125 MCG tablet  Take 1 tablet (125 mcg total) by mouth daily before breakfast. (Patient taking differently: Take 125 mcg by mouth at bedtime.) 30 tablet 0  . lidocaine (XYLOCAINE) 5 % ointment Apply 1 application topically as needed. 35.44 g 0  . lisinopril (ZESTRIL) 10 MG tablet Take 10 mg by mouth daily.     . Melatonin 10 MG CAPS Take 10 mg by mouth at bedtime.    . multivitamin (RENA-VIT) TABS tablet Take 1 tablet by mouth daily.    . polyethylene glycol (MIRALAX / GLYCOLAX) 17 g packet Take 17 g by mouth daily as needed for moderate constipation.    . pregabalin (LYRICA) 150 MG capsule Take 1 capsule (150 mg total) by mouth 3 (three) times daily. 90 capsule 0  . sevelamer carbonate (RENVELA) 800 MG tablet Take 1,600-3,200 mg by mouth See admin instructions. Take 3200 mg with each meal and 1600 mg with each snack    . topiramate (TOPAMAX) 25 MG tablet Take 2 tablets (50 mg total) by mouth 2 (two) times daily. To be given post dialysis, titrated as directed to patient 360 tablet 1  . TOUJEO SOLOSTAR 300 UNIT/ML SOPN Inject 54-78 Units as directed at bedtime as needed (if blood sugar is 130 or higher).     . midodrine (PROAMATINE) 10 MG tablet Take 10 mg by mouth Every Tuesday,Thursday,and Saturday with dialysis. (Patient not taking: No sig reported)     Current Facility-Administered Medications on File Prior to Visit  Medication Dose Route Frequency Provider Last Rate Last Admin  . 0.9 %  sodium chloride infusion  250 mL Intravenous PRN [name], MD        Physical exam:  Vitals:   08/11/20 1415  BP: (!) 163/81  Pulse: 71  Resp: 20  Temp: 97.7 F (36.5 C)  SpO2: 96%  Weight: 240 lb 8.4 oz (109.1 kg)  Height: 6\' 4"  (1.93 m)    Extremities: Healing left heel ulcer basically a crack at this point nontender all incisions healed in the left leg no palpable pedal pulses  Neuro: Symmetric upper extremity and lower extremity motor strength 5/5 no facial asymmetry  Data: Patient had a graft  duplex scan as well as bilateral ABIs today.  I reviewed and interpreted the study.  He also had a carotid duplex scan today.  Left internal carotid artery was greater than 80% right no significant stenosis also some evidence of left subclavian stenosis.  Assessment: #1 peripheral arterial  disease patent bypass graft left leg with healing wound needs repeat duplex scan in 3 months with ABIs  2.  Greater than 80% left internal carotid artery stenosis currently asymptomatic.  Discussed with the patient today possibility of left carotid endarterectomy versus left TCAR stent.  He has had several procedures recently and wishes to defer any intervention for a few weeks.  We discussed the signs and symptoms of TIA amaurosis and stroke and he will call me if he develops any of the symptoms in the near future.  Otherwise he will return in 6 weeks time and we will obtain a CT angiogram of the head and neck prior to that to determine which procedure would be best for him with stenting or carotid endarterectomy.  He is currently on Plavix and a statin.  If we end up doing a carotid stent procedure we would need to add aspirin to this.  Plan: See above  [name], MD [facility] Office: [phone]

## 2020-08-12 DIAGNOSIS — N186 End stage renal disease: Secondary | ICD-10-CM | POA: Diagnosis not present

## 2020-08-12 DIAGNOSIS — Z992 Dependence on renal dialysis: Secondary | ICD-10-CM | POA: Diagnosis not present

## 2020-08-13 DIAGNOSIS — D631 Anemia in chronic kidney disease: Secondary | ICD-10-CM | POA: Diagnosis not present

## 2020-08-13 DIAGNOSIS — N186 End stage renal disease: Secondary | ICD-10-CM | POA: Diagnosis not present

## 2020-08-13 DIAGNOSIS — N2581 Secondary hyperparathyroidism of renal origin: Secondary | ICD-10-CM | POA: Diagnosis not present

## 2020-08-13 DIAGNOSIS — E559 Vitamin D deficiency, unspecified: Secondary | ICD-10-CM | POA: Diagnosis not present

## 2020-08-13 DIAGNOSIS — Z992 Dependence on renal dialysis: Secondary | ICD-10-CM | POA: Diagnosis not present

## 2020-08-13 DIAGNOSIS — D509 Iron deficiency anemia, unspecified: Secondary | ICD-10-CM | POA: Diagnosis not present

## 2020-08-15 DIAGNOSIS — D649 Anemia, unspecified: Secondary | ICD-10-CM | POA: Diagnosis not present

## 2020-08-15 DIAGNOSIS — D519 Vitamin B12 deficiency anemia, unspecified: Secondary | ICD-10-CM | POA: Diagnosis not present

## 2020-08-15 DIAGNOSIS — D529 Folate deficiency anemia, unspecified: Secondary | ICD-10-CM | POA: Diagnosis not present

## 2020-08-15 DIAGNOSIS — I10 Essential (primary) hypertension: Secondary | ICD-10-CM | POA: Diagnosis not present

## 2020-08-15 DIAGNOSIS — E039 Hypothyroidism, unspecified: Secondary | ICD-10-CM | POA: Diagnosis not present

## 2020-08-15 DIAGNOSIS — E782 Mixed hyperlipidemia: Secondary | ICD-10-CM | POA: Diagnosis not present

## 2020-08-15 DIAGNOSIS — Z1159 Encounter for screening for other viral diseases: Secondary | ICD-10-CM | POA: Diagnosis not present

## 2020-08-15 DIAGNOSIS — K2100 Gastro-esophageal reflux disease with esophagitis, without bleeding: Secondary | ICD-10-CM | POA: Diagnosis not present

## 2020-08-15 DIAGNOSIS — N185 Chronic kidney disease, stage 5: Secondary | ICD-10-CM | POA: Diagnosis not present

## 2020-08-15 DIAGNOSIS — E1143 Type 2 diabetes mellitus with diabetic autonomic (poly)neuropathy: Secondary | ICD-10-CM | POA: Diagnosis not present

## 2020-08-15 DIAGNOSIS — E162 Hypoglycemia, unspecified: Secondary | ICD-10-CM | POA: Diagnosis not present

## 2020-08-16 ENCOUNTER — Other Ambulatory Visit: Payer: Self-pay | Admitting: Neurology

## 2020-08-16 DIAGNOSIS — D509 Iron deficiency anemia, unspecified: Secondary | ICD-10-CM | POA: Diagnosis not present

## 2020-08-16 DIAGNOSIS — N186 End stage renal disease: Secondary | ICD-10-CM | POA: Diagnosis not present

## 2020-08-16 DIAGNOSIS — N2581 Secondary hyperparathyroidism of renal origin: Secondary | ICD-10-CM | POA: Diagnosis not present

## 2020-08-16 DIAGNOSIS — D631 Anemia in chronic kidney disease: Secondary | ICD-10-CM | POA: Diagnosis not present

## 2020-08-16 DIAGNOSIS — E559 Vitamin D deficiency, unspecified: Secondary | ICD-10-CM | POA: Diagnosis not present

## 2020-08-16 DIAGNOSIS — Z992 Dependence on renal dialysis: Secondary | ICD-10-CM | POA: Diagnosis not present

## 2020-08-16 MED ORDER — TOPIRAMATE 25 MG PO TABS
50.0000 mg | ORAL_TABLET | Freq: Two times a day (BID) | ORAL | 0 refills | Status: DC
Start: 2020-08-16 — End: 2020-11-10

## 2020-08-16 NOTE — Telephone Encounter (Signed)
Rx(s) sent to pharmacy electronically.  

## 2020-08-18 DIAGNOSIS — N2581 Secondary hyperparathyroidism of renal origin: Secondary | ICD-10-CM | POA: Diagnosis not present

## 2020-08-18 DIAGNOSIS — D509 Iron deficiency anemia, unspecified: Secondary | ICD-10-CM | POA: Diagnosis not present

## 2020-08-18 DIAGNOSIS — E559 Vitamin D deficiency, unspecified: Secondary | ICD-10-CM | POA: Diagnosis not present

## 2020-08-18 DIAGNOSIS — N186 End stage renal disease: Secondary | ICD-10-CM | POA: Diagnosis not present

## 2020-08-18 DIAGNOSIS — D631 Anemia in chronic kidney disease: Secondary | ICD-10-CM | POA: Diagnosis not present

## 2020-08-18 DIAGNOSIS — Z992 Dependence on renal dialysis: Secondary | ICD-10-CM | POA: Diagnosis not present

## 2020-08-19 ENCOUNTER — Other Ambulatory Visit: Payer: Self-pay | Admitting: *Deleted

## 2020-08-19 DIAGNOSIS — I6522 Occlusion and stenosis of left carotid artery: Secondary | ICD-10-CM

## 2020-08-20 DIAGNOSIS — E559 Vitamin D deficiency, unspecified: Secondary | ICD-10-CM | POA: Diagnosis not present

## 2020-08-20 DIAGNOSIS — N2581 Secondary hyperparathyroidism of renal origin: Secondary | ICD-10-CM | POA: Diagnosis not present

## 2020-08-20 DIAGNOSIS — D509 Iron deficiency anemia, unspecified: Secondary | ICD-10-CM | POA: Diagnosis not present

## 2020-08-20 DIAGNOSIS — D631 Anemia in chronic kidney disease: Secondary | ICD-10-CM | POA: Diagnosis not present

## 2020-08-20 DIAGNOSIS — Z992 Dependence on renal dialysis: Secondary | ICD-10-CM | POA: Diagnosis not present

## 2020-08-20 DIAGNOSIS — N186 End stage renal disease: Secondary | ICD-10-CM | POA: Diagnosis not present

## 2020-08-23 DIAGNOSIS — D631 Anemia in chronic kidney disease: Secondary | ICD-10-CM | POA: Diagnosis not present

## 2020-08-23 DIAGNOSIS — N186 End stage renal disease: Secondary | ICD-10-CM | POA: Diagnosis not present

## 2020-08-23 DIAGNOSIS — E559 Vitamin D deficiency, unspecified: Secondary | ICD-10-CM | POA: Diagnosis not present

## 2020-08-23 DIAGNOSIS — D509 Iron deficiency anemia, unspecified: Secondary | ICD-10-CM | POA: Diagnosis not present

## 2020-08-23 DIAGNOSIS — Z992 Dependence on renal dialysis: Secondary | ICD-10-CM | POA: Diagnosis not present

## 2020-08-23 DIAGNOSIS — N2581 Secondary hyperparathyroidism of renal origin: Secondary | ICD-10-CM | POA: Diagnosis not present

## 2020-08-25 DIAGNOSIS — D631 Anemia in chronic kidney disease: Secondary | ICD-10-CM | POA: Diagnosis not present

## 2020-08-25 DIAGNOSIS — E559 Vitamin D deficiency, unspecified: Secondary | ICD-10-CM | POA: Diagnosis not present

## 2020-08-25 DIAGNOSIS — N186 End stage renal disease: Secondary | ICD-10-CM | POA: Diagnosis not present

## 2020-08-25 DIAGNOSIS — D509 Iron deficiency anemia, unspecified: Secondary | ICD-10-CM | POA: Diagnosis not present

## 2020-08-25 DIAGNOSIS — Z992 Dependence on renal dialysis: Secondary | ICD-10-CM | POA: Diagnosis not present

## 2020-08-25 DIAGNOSIS — N2581 Secondary hyperparathyroidism of renal origin: Secondary | ICD-10-CM | POA: Diagnosis not present

## 2020-08-27 DIAGNOSIS — N2581 Secondary hyperparathyroidism of renal origin: Secondary | ICD-10-CM | POA: Diagnosis not present

## 2020-08-27 DIAGNOSIS — D631 Anemia in chronic kidney disease: Secondary | ICD-10-CM | POA: Diagnosis not present

## 2020-08-27 DIAGNOSIS — E559 Vitamin D deficiency, unspecified: Secondary | ICD-10-CM | POA: Diagnosis not present

## 2020-08-27 DIAGNOSIS — N186 End stage renal disease: Secondary | ICD-10-CM | POA: Diagnosis not present

## 2020-08-27 DIAGNOSIS — Z992 Dependence on renal dialysis: Secondary | ICD-10-CM | POA: Diagnosis not present

## 2020-08-27 DIAGNOSIS — D509 Iron deficiency anemia, unspecified: Secondary | ICD-10-CM | POA: Diagnosis not present

## 2020-09-01 DIAGNOSIS — E559 Vitamin D deficiency, unspecified: Secondary | ICD-10-CM | POA: Diagnosis not present

## 2020-09-01 DIAGNOSIS — D509 Iron deficiency anemia, unspecified: Secondary | ICD-10-CM | POA: Diagnosis not present

## 2020-09-01 DIAGNOSIS — D631 Anemia in chronic kidney disease: Secondary | ICD-10-CM | POA: Diagnosis not present

## 2020-09-01 DIAGNOSIS — Z992 Dependence on renal dialysis: Secondary | ICD-10-CM | POA: Diagnosis not present

## 2020-09-01 DIAGNOSIS — N2581 Secondary hyperparathyroidism of renal origin: Secondary | ICD-10-CM | POA: Diagnosis not present

## 2020-09-01 DIAGNOSIS — N186 End stage renal disease: Secondary | ICD-10-CM | POA: Diagnosis not present

## 2020-09-01 DIAGNOSIS — G932 Benign intracranial hypertension: Secondary | ICD-10-CM | POA: Diagnosis not present

## 2020-09-01 DIAGNOSIS — H4710 Unspecified papilledema: Secondary | ICD-10-CM | POA: Diagnosis not present

## 2020-09-03 DIAGNOSIS — D631 Anemia in chronic kidney disease: Secondary | ICD-10-CM | POA: Diagnosis not present

## 2020-09-03 DIAGNOSIS — E559 Vitamin D deficiency, unspecified: Secondary | ICD-10-CM | POA: Diagnosis not present

## 2020-09-03 DIAGNOSIS — Z992 Dependence on renal dialysis: Secondary | ICD-10-CM | POA: Diagnosis not present

## 2020-09-03 DIAGNOSIS — N2581 Secondary hyperparathyroidism of renal origin: Secondary | ICD-10-CM | POA: Diagnosis not present

## 2020-09-03 DIAGNOSIS — D509 Iron deficiency anemia, unspecified: Secondary | ICD-10-CM | POA: Diagnosis not present

## 2020-09-03 DIAGNOSIS — N186 End stage renal disease: Secondary | ICD-10-CM | POA: Diagnosis not present

## 2020-09-06 ENCOUNTER — Other Ambulatory Visit: Payer: Self-pay | Admitting: Internal Medicine

## 2020-09-06 DIAGNOSIS — D631 Anemia in chronic kidney disease: Secondary | ICD-10-CM | POA: Diagnosis not present

## 2020-09-06 DIAGNOSIS — Z992 Dependence on renal dialysis: Secondary | ICD-10-CM | POA: Diagnosis not present

## 2020-09-06 DIAGNOSIS — N2581 Secondary hyperparathyroidism of renal origin: Secondary | ICD-10-CM | POA: Diagnosis not present

## 2020-09-06 DIAGNOSIS — D509 Iron deficiency anemia, unspecified: Secondary | ICD-10-CM | POA: Diagnosis not present

## 2020-09-06 DIAGNOSIS — N186 End stage renal disease: Secondary | ICD-10-CM | POA: Diagnosis not present

## 2020-09-06 DIAGNOSIS — E559 Vitamin D deficiency, unspecified: Secondary | ICD-10-CM | POA: Diagnosis not present

## 2020-09-08 DIAGNOSIS — N186 End stage renal disease: Secondary | ICD-10-CM | POA: Diagnosis not present

## 2020-09-08 DIAGNOSIS — M79674 Pain in right toe(s): Secondary | ICD-10-CM | POA: Diagnosis not present

## 2020-09-08 DIAGNOSIS — D631 Anemia in chronic kidney disease: Secondary | ICD-10-CM | POA: Diagnosis not present

## 2020-09-08 DIAGNOSIS — M79672 Pain in left foot: Secondary | ICD-10-CM | POA: Diagnosis not present

## 2020-09-08 DIAGNOSIS — E1140 Type 2 diabetes mellitus with diabetic neuropathy, unspecified: Secondary | ICD-10-CM | POA: Diagnosis not present

## 2020-09-08 DIAGNOSIS — E1151 Type 2 diabetes mellitus with diabetic peripheral angiopathy without gangrene: Secondary | ICD-10-CM | POA: Diagnosis not present

## 2020-09-08 DIAGNOSIS — I739 Peripheral vascular disease, unspecified: Secondary | ICD-10-CM | POA: Diagnosis not present

## 2020-09-08 DIAGNOSIS — Z992 Dependence on renal dialysis: Secondary | ICD-10-CM | POA: Diagnosis not present

## 2020-09-08 DIAGNOSIS — M79675 Pain in left toe(s): Secondary | ICD-10-CM | POA: Diagnosis not present

## 2020-09-08 DIAGNOSIS — D509 Iron deficiency anemia, unspecified: Secondary | ICD-10-CM | POA: Diagnosis not present

## 2020-09-08 DIAGNOSIS — M79671 Pain in right foot: Secondary | ICD-10-CM | POA: Diagnosis not present

## 2020-09-08 DIAGNOSIS — N2581 Secondary hyperparathyroidism of renal origin: Secondary | ICD-10-CM | POA: Diagnosis not present

## 2020-09-08 DIAGNOSIS — E559 Vitamin D deficiency, unspecified: Secondary | ICD-10-CM | POA: Diagnosis not present

## 2020-09-10 DIAGNOSIS — N2581 Secondary hyperparathyroidism of renal origin: Secondary | ICD-10-CM | POA: Diagnosis not present

## 2020-09-10 DIAGNOSIS — D509 Iron deficiency anemia, unspecified: Secondary | ICD-10-CM | POA: Diagnosis not present

## 2020-09-10 DIAGNOSIS — D631 Anemia in chronic kidney disease: Secondary | ICD-10-CM | POA: Diagnosis not present

## 2020-09-10 DIAGNOSIS — Z992 Dependence on renal dialysis: Secondary | ICD-10-CM | POA: Diagnosis not present

## 2020-09-10 DIAGNOSIS — N186 End stage renal disease: Secondary | ICD-10-CM | POA: Diagnosis not present

## 2020-09-10 DIAGNOSIS — E559 Vitamin D deficiency, unspecified: Secondary | ICD-10-CM | POA: Diagnosis not present

## 2020-09-12 DIAGNOSIS — Z992 Dependence on renal dialysis: Secondary | ICD-10-CM | POA: Diagnosis not present

## 2020-09-12 DIAGNOSIS — N186 End stage renal disease: Secondary | ICD-10-CM | POA: Diagnosis not present

## 2020-09-13 DIAGNOSIS — Z992 Dependence on renal dialysis: Secondary | ICD-10-CM | POA: Diagnosis not present

## 2020-09-13 DIAGNOSIS — N2581 Secondary hyperparathyroidism of renal origin: Secondary | ICD-10-CM | POA: Diagnosis not present

## 2020-09-13 DIAGNOSIS — N186 End stage renal disease: Secondary | ICD-10-CM | POA: Diagnosis not present

## 2020-09-13 DIAGNOSIS — D631 Anemia in chronic kidney disease: Secondary | ICD-10-CM | POA: Diagnosis not present

## 2020-09-13 DIAGNOSIS — D509 Iron deficiency anemia, unspecified: Secondary | ICD-10-CM | POA: Diagnosis not present

## 2020-09-15 DIAGNOSIS — D631 Anemia in chronic kidney disease: Secondary | ICD-10-CM | POA: Diagnosis not present

## 2020-09-15 DIAGNOSIS — Z992 Dependence on renal dialysis: Secondary | ICD-10-CM | POA: Diagnosis not present

## 2020-09-15 DIAGNOSIS — N186 End stage renal disease: Secondary | ICD-10-CM | POA: Diagnosis not present

## 2020-09-15 DIAGNOSIS — N2581 Secondary hyperparathyroidism of renal origin: Secondary | ICD-10-CM | POA: Diagnosis not present

## 2020-09-15 DIAGNOSIS — D509 Iron deficiency anemia, unspecified: Secondary | ICD-10-CM | POA: Diagnosis not present

## 2020-09-15 NOTE — Progress Notes (Signed)
Assessment/Plan:   1.  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  Regardless, ophthalmology is following the patient closely and decided to go ahead and follow him up with visual field testing in 8 weeks.  Because they are following him closely and because he is off of his topiramate, the patient does not need to see me back right now.  However, the importance of compliance with Dr. Valetta Close was discussed with him.  Discussed worsening of vision with lack of compliance.  -pt asks about getting new glasses but explained the difference between optic nerve pallor causing inability to see and refractive issues.  Told him he could address this with Dr. Valetta Close, but I suspect that his issues with not being able to see through the glasses are optic nerve issues.  2.  Elevated CSF protein             -MRI/MRV brain neg             -cytology neg             -cx neg             -could be possiblity that due to DM in combination with spinal stenosis.  Protein a bit high for DM alone.    I sent him to oncology/hematology for evaluation of it, but unfortunately don't see that they addressed it (although patient states that they did discuss)  3.  Left carotid stenosis, 60 to 79%             -Patient following with Dr. [name] and they are discussing possible endarterectomy.  Patient states that he is likely going to proceed  4.  Neuropathic pain  -explained to the patient that treatment is going to be very limited in options because of his kidneys and dialysis.  He has been on gabapentin without relief.  He is already on Lyrica.  I think that the best options for him are going to be in the  pain management realm to see if localized injections or patches could be of value for him.  Patient was agreeable to a referral.  Subjective   Patient seen today in follow-up for pseudotumor cerebri.  Records reviewed since last visit.  Pt with sister who supplements hx.   Patient was sent to hematology because of his fairly elevated CSF protein.  Unfortunately, it did not appear that oncology addressed this (patient states that he did) and for some reason addressed his eye issues and told him he did not have IIH, but rather ischemic optic neuropathy (this is not what ophthalmology has told me).  Oncology told him he could stop his topiramate, despite the fact that the nerve was actually improved when he was taking the medication.  The patient did that.  Fortunately, he still did follow-up with Dr. Valetta Close on January 20.  I just called and got that note.  He told Dr. Valetta Close that he saw a neurologist at [facility] who told him to stop the Topamax.  Dr. Valetta Close recommended that since he is off of the Topamax that they repeat visual fields in 8 weeks.  Patient states that he actually feels that the vision is better than when this all started.  He used to feel that everything was blurry out of the right eye, but now feels that it is just the nasal aspect.  Pt also c/o nerve pain in the leg with the stump.  States that is the primary reason he came here today.  He was told that he needed a neurologist.  He has tried gabapentin in the past without relief.  He is currently on fairly high-dose Lyrica.  He states that he often times will have muscle spasms in the area of the stump.    Current Outpatient Medications on File Prior to Visit  Medication Sig Dispense Refill  . albuterol (VENTOLIN HFA) 108 (90 Base) MCG/ACT inhaler Inhale 2 puffs into the lungs every 6 (six) hours as needed for wheezing or shortness of breath.     . ALPRAZolam (XANAX) 0.25 MG tablet Take 0.25 mg by mouth 2 (two) times daily as needed.    .  ARTIFICIAL TEAR OP Place 1 drop into both eyes every 6 (six) hours as needed (dry eyes).    Marland Kitchen atorvastatin (LIPITOR) 80 MG tablet Take 1 tablet (80 mg total) by mouth at bedtime. (Patient taking differently: Take 80 mg by mouth daily.) 30 tablet 5  . bismuth subsalicylate (PEPTO BISMOL) 262 MG/15ML suspension Take 30 mLs by mouth every 6 (six) hours as needed for indigestion.    . calcium acetate (PHOSLO) 667 MG capsule Take 4 capsules (2,668 mg total) by mouth 3 (three) times daily with meals. (Patient taking differently: Take by mouth See admin instructions. Take 2 tabs with meals and 1334 with each snack) 360 capsule 0  . cinacalcet (SENSIPAR) 30 MG tablet Take 1 tablet (30 mg total) by mouth every evening. 60 tablet 0  . clopidogrel (PLAVIX) 75 MG tablet Take 1 tablet (75 mg total) by mouth daily. 30 tablet 0  . cyclobenzaprine (FLEXERIL) 10 MG tablet Take 10 mg by mouth 3 (three) times daily as needed.    . diphenhydrAMINE (BENADRYL) 25 MG tablet Take 25 mg by mouth daily as needed for itching.    . DULoxetine (CYMBALTA) 30 MG capsule Take 30 mg by mouth daily.    Marland Kitchen GLOBAL EASE INJECT PEN NEEDLES 32G X 4 MM MISC TO BE USED DAILY WITH INSULIN    . ibuprofen (ADVIL) 200 MG tablet Take 400 mg by mouth 3 (three) times daily.    Marland Kitchen levothyroxine (SYNTHROID) 125 MCG tablet Take 1 tablet (125 mcg total) by mouth daily before breakfast. 30 tablet 0  . lisinopril (ZESTRIL) 10 MG tablet Take 10 mg by mouth daily.     . Melatonin 10 MG CAPS Take 10 mg by mouth at bedtime.    . multivitamin (RENA-VIT) TABS tablet Take 1 tablet by mouth daily.    . polyethylene glycol (MIRALAX / GLYCOLAX) 17 g packet Take 17 g by mouth daily as needed for moderate constipation.    . pregabalin (LYRICA) 150 MG capsule Take 1 capsule (150 mg total) by mouth 3 (three) times daily. 90 capsule 0  . sevelamer carbonate (RENVELA) 800 MG tablet Take 1,600-3,200 mg by mouth See admin instructions. Take 3200 mg with each meal and 1600  mg with each snack    . topiramate (TOPAMAX) 25 MG tablet Take 2 tablets (50 mg total) by mouth 2 (two) times daily. To be given post dialysis, titrated as directed to patient 360 tablet 0  .  TOUJEO SOLOSTAR 300 UNIT/ML SOPN Inject 54-78 Units as directed at bedtime as needed (if blood sugar is 130 or higher).     . midodrine (PROAMATINE) 10 MG tablet Take 10 mg by mouth Every Tuesday,Thursday,and Saturday with dialysis. (Patient not taking: No sig reported)     Current Facility-Administered Medications on File Prior to Visit  Medication Dose Route Frequency Provider Last Rate Last Admin  . 0.9 %  sodium chloride infusion  250 mL Intravenous PRN [name], MD         Objective   Vitals:   09/19/20 1359  BP: (!) 146/64  Pulse: 69  SpO2: 99%  Weight: 238 lb (108 kg)  Height: 6\' 4"  (1.93 m)   GEN:  The patient appears stated age and is in NAD.  Neurological examination:  Orientation: The patient is alert and oriented x3. Cranial nerves: There is good facial symmetry.    Visual fields are full to confrontational testing.  Speech is fluent and clear.   Motor: Strength is 5/5 in the bilateral upper, left lower and right stump  Movement examination: Tone: wnl Abnormal movements: None seen today. Coordination:  There is no decremation with RAM's Gait and Station: in [facility]     Follow up Instructions      -I discussed the assessment and treatment plan with the patient. The patient was provided an opportunity to ask questions and all were answered. The patient agreed with the plan and demonstrated an understanding of the instructions.   The patient was advised to call back or seek an in-person evaluation if the symptoms worsen or if the condition fails to improve as anticipated.    Total time spent on today's visit was 30 minutes, including both face-to-face time and nonface-to-face time.  Time included that spent on review of records (prior notes available to  me/labs/imaging if pertinent), discussing treatment and goals, answering patient's questions and coordinating care.   Alonza Bogus, DO

## 2020-09-16 ENCOUNTER — Other Ambulatory Visit: Payer: Self-pay | Admitting: Internal Medicine

## 2020-09-17 DIAGNOSIS — Z992 Dependence on renal dialysis: Secondary | ICD-10-CM | POA: Diagnosis not present

## 2020-09-17 DIAGNOSIS — N186 End stage renal disease: Secondary | ICD-10-CM | POA: Diagnosis not present

## 2020-09-17 DIAGNOSIS — D509 Iron deficiency anemia, unspecified: Secondary | ICD-10-CM | POA: Diagnosis not present

## 2020-09-17 DIAGNOSIS — N2581 Secondary hyperparathyroidism of renal origin: Secondary | ICD-10-CM | POA: Diagnosis not present

## 2020-09-17 DIAGNOSIS — D631 Anemia in chronic kidney disease: Secondary | ICD-10-CM | POA: Diagnosis not present

## 2020-09-19 ENCOUNTER — Other Ambulatory Visit: Payer: Self-pay

## 2020-09-19 ENCOUNTER — Telehealth (INDEPENDENT_AMBULATORY_CARE_PROVIDER_SITE_OTHER): Payer: Medicare Other | Admitting: Neurology

## 2020-09-19 ENCOUNTER — Encounter: Payer: Self-pay | Admitting: Neurology

## 2020-09-19 VITALS — BP 146/64 | HR 69 | Ht 76.0 in | Wt 238.0 lb

## 2020-09-19 DIAGNOSIS — Z992 Dependence on renal dialysis: Secondary | ICD-10-CM

## 2020-09-19 DIAGNOSIS — G932 Benign intracranial hypertension: Secondary | ICD-10-CM

## 2020-09-19 DIAGNOSIS — G603 Idiopathic progressive neuropathy: Secondary | ICD-10-CM

## 2020-09-19 DIAGNOSIS — N186 End stage renal disease: Secondary | ICD-10-CM

## 2020-09-19 DIAGNOSIS — I6522 Occlusion and stenosis of left carotid artery: Secondary | ICD-10-CM

## 2020-09-19 NOTE — Patient Instructions (Signed)
Dr. Alysia Penna Address: [address], [place],  [address] Phone: [phone]

## 2020-09-20 DIAGNOSIS — N2581 Secondary hyperparathyroidism of renal origin: Secondary | ICD-10-CM | POA: Diagnosis not present

## 2020-09-20 DIAGNOSIS — D631 Anemia in chronic kidney disease: Secondary | ICD-10-CM | POA: Diagnosis not present

## 2020-09-20 DIAGNOSIS — D509 Iron deficiency anemia, unspecified: Secondary | ICD-10-CM | POA: Diagnosis not present

## 2020-09-20 DIAGNOSIS — N186 End stage renal disease: Secondary | ICD-10-CM | POA: Diagnosis not present

## 2020-09-20 DIAGNOSIS — Z992 Dependence on renal dialysis: Secondary | ICD-10-CM | POA: Diagnosis not present

## 2020-09-22 ENCOUNTER — Ambulatory Visit (INDEPENDENT_AMBULATORY_CARE_PROVIDER_SITE_OTHER): Payer: Medicare Other | Admitting: Vascular Surgery

## 2020-09-22 ENCOUNTER — Encounter: Payer: Self-pay | Admitting: Vascular Surgery

## 2020-09-22 ENCOUNTER — Other Ambulatory Visit: Payer: Self-pay

## 2020-09-22 ENCOUNTER — Other Ambulatory Visit: Payer: Self-pay | Admitting: *Deleted

## 2020-09-22 VITALS — BP 138/68 | HR 63 | Temp 98.1°F | Resp 20 | Ht 76.0 in | Wt 238.0 lb

## 2020-09-22 DIAGNOSIS — I739 Peripheral vascular disease, unspecified: Secondary | ICD-10-CM

## 2020-09-22 DIAGNOSIS — I6522 Occlusion and stenosis of left carotid artery: Secondary | ICD-10-CM

## 2020-09-22 DIAGNOSIS — D509 Iron deficiency anemia, unspecified: Secondary | ICD-10-CM | POA: Diagnosis not present

## 2020-09-22 DIAGNOSIS — N2581 Secondary hyperparathyroidism of renal origin: Secondary | ICD-10-CM | POA: Diagnosis not present

## 2020-09-22 DIAGNOSIS — N186 End stage renal disease: Secondary | ICD-10-CM | POA: Diagnosis not present

## 2020-09-22 DIAGNOSIS — D631 Anemia in chronic kidney disease: Secondary | ICD-10-CM | POA: Diagnosis not present

## 2020-09-22 DIAGNOSIS — Z992 Dependence on renal dialysis: Secondary | ICD-10-CM | POA: Diagnosis not present

## 2020-09-22 NOTE — Progress Notes (Addendum)
Patient is a 70 year old male who returns for follow-up today.  He was last seen December 2021.  We have been following him for carotid and peripheral arterial disease.  His most recent procedure he underwent left femoral endarterectomy and left femoral to below-knee popliteal bypass with vein August 2021.  This was done for nonhealing wound in the left foot.  He has had a prior right above-knee amputation.  He is on Plavix and a statin.  At his last office visit he was found to have a greater than 80% left internal carotid artery stenosis with no significant right-sided stenosis.  He returns today for further discussion regarding this.  He has had no symptoms of TIA amaurosis or stroke.  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  Past Medical History:  Diagnosis Date  . Anemia   . Anxiety   . Arthritis   . CAD (coronary artery disease)    Myoview August 2021 with evidence of large inferior scar but no active ischemia  . COPD (chronic obstructive pulmonary disease) ([place])   . Depression   . ESRD on hemodialysis ([place])   . Essential hypertension   . GERD (gastroesophageal reflux disease)   . H/O hiatal hernia   . Headache(784.0)   . History of kidney stones   . History of pneumonia   . Hypothyroidism   . Neuropathy   . Non-healing wound of amputation stump (H[facility])    Right  . Peripheral vascular disease ([place])   . PTSD (post-traumatic stress disorder)   . Type 2 diabetes mellitus ([place])     Past Surgical History:  Procedure Laterality Date  . A/V FISTULAGRAM Left 01/16/2019   Procedure: A/V FISTULAGRAM;  Surgeon: [name], MD;  Location: [place] [facility];  Service: Cardiovascular;  Laterality: Left;  . ABDOMINAL AORTOGRAM W/LOWER EXTREMITY Bilateral 03/18/2020   Procedure: ABDOMINAL AORTOGRAM W/LOWER EXTREMITY;  Surgeon: [name], MD;  Location: [place] [facility];  Service: Cardiovascular;  Laterality:  Bilateral;  . AMPUTATION Right 12/01/2018   Procedure: RIGHT AMPUTATION BELOW KNEE;  Surgeon: [name], MD;  Location: [facility] OR;  Service: Vascular;  Laterality: Right;  . AMPUTATION Right 04/27/2019   Procedure: AMPUTATION BELOW KNEE REVISION;  Surgeon: [name], MD;  Location: [place];  Service: Vascular;  Laterality: Right;  . AMPUTATION Right 04/30/2019   Procedure: AMPUTATION ABOVE KNEE - RIGHT;  Surgeon: Waynetta Sandy, MD;  Location: [place];  Service: Vascular;  Laterality: Right;  . APPLICATION OF WOUND VAC Right 04/27/2019   Procedure: APPLICATION OF WOUND VAC;  Surgeon: [name], MD;  Location: [place];  Service: Vascular;  Laterality: Right;  . AV FISTULA PLACEMENT  2012      left arm   . AV FISTULA PLACEMENT Left 12/18/2012   Procedure: ARTERIOVENOUS (AV) FISTULA CREATION;  Surgeon: Angelia Mould, MD;  Location: [place];  Service: Vascular;  Laterality: Left;  . COLONOSCOPY  10/26/2011   Procedure: COLONOSCOPY;  Surgeon: Rogene Houston, MD;  [facility];  Service: Endoscopy;  Laterality: N/A;  730  . EMBOLECTOMY Left 12/09/2012   Procedure: EMBOLECTOMY;  Surgeon: Serafina Mitchell, MD;  Location: [facility] CATH LAB;  Service: Cardiovascular;  Laterality: Left;  left arm venous embolization  . ENDARTERECTOMY FEMORAL Left 04/04/2020   Procedure: ENDARTERECTOMY COMMON FEMORAL;  Surgeon: [name], MD;  Location: [place];  Service: Vascular;  Laterality: Left;  . ESOPHAGOGASTRODUODENOSCOPY (EGD) WITH ESOPHAGEAL DILATION N/A 04/23/2013   Procedure: ESOPHAGOGASTRODUODENOSCOPY (EGD) WITH ESOPHAGEAL DILATION;  Surgeon: Rogene Houston, MD;  [facility];  Service: Endoscopy;  Laterality: N/A;  200-moved to 930   . FEMORAL-POPLITEAL BYPASS GRAFT Left 04/04/2020   Procedure: FEMORAL- BELOW KNEE POPLITEAL BYPASS GRAFT NON REVERSED VEIN;  Surgeon: [name], MD;  Location: [place];  Service: Vascular;  Laterality: Left;  . FISTULA  SUPERFICIALIZATION Left 06/18/2013   Procedure: FISTULA SUPERFICIALIZATION & LIGATION BRANCH X 1;  Surgeon: Mal Misty, MD;  Location: [place];  Service: Vascular;  Laterality: Left;  . GROIN DEBRIDEMENT Left 04/29/2020   Procedure: EXPLORATION LEFT GROIN WITH DEBRIDEMENT;  Surgeon: Rosetta Posner, MD;  Location: [place];  Service: Vascular;  Laterality: Left;  . HEMATOMA EVACUATION Right 02/11/2017   Procedure: EVACUATION HEMATOMA RIGHT GROIN, Repair of Right Pseudo-anerysm.;  Surgeon: [name], MD;  [facility]: [facility];  Service: Vascular;  Laterality: Right;  . INGUINAL HERNIA REPAIR     ,  times   2  . INSERTION OF DIALYSIS CATHETER Left 12/18/2012   Procedure: INSERTION OF DIALYSIS CATHETER;  Surgeon: Angelia Mould, MD;  Location: [place];  Service: Vascular;  Laterality: Left;  . KNEE ARTHROSCOPY  2011   Right Knee  . LOWER EXTREMITY ANGIOGRAPHY N/A 02/11/2017   Procedure: Lower Extremity Angiography;  Surgeon: Lorretta Harp, MD;  Location: [place] [facility];  Service: Cardiovascular;  Laterality: N/A;  . PERIPHERAL ATHRECTOMY  02/11/2017  . PERIPHERAL VASCULAR ATHERECTOMY Left 02/11/2017   Procedure: Peripheral Vascular Atherectomy;  Surgeon: Lorretta Harp, MD;  Location: [place] [facility];  Service: Cardiovascular;  Laterality: Left;  . PERIPHERAL VASCULAR BALLOON ANGIOPLASTY Left 01/16/2019   Procedure: PERIPHERAL VASCULAR BALLOON ANGIOPLASTY;  Surgeon: [name], MD;  Location: [place] [facility];  Service: Cardiovascular;  Laterality: Left;  central vein  . REVISON OF ARTERIOVENOUS FISTULA Left 05/03/1940   Procedure: PLICATION OF LEFT BRACHIOCEPHALIC ARTERIOVENOUS FISTULA;  Surgeon: Conrad London Mills, MD;  Location: [place];  Service: Vascular;  Laterality: Left;  . SHUNTOGRAM N/A 12/09/2012   Procedure: fistulogram;  Surgeon: Serafina Mitchell, MD;  Location: [facility] CATH LAB;  Service: Cardiovascular;  Laterality: N/A;  . SHUNTOGRAM Left 06/03/2013   Procedure: Fistulogram;   Surgeon: Serafina Mitchell, MD;  Location: [facility] CATH LAB;  Service: Cardiovascular;  Laterality: Left;  . THROMBECTOMY W/ EMBOLECTOMY Left 12/11/2012   Procedure: THROMBECTOMY ARTERIOVENOUS FISTULA;  Surgeon: Serafina Mitchell, MD;  Location: [place];  Service: Vascular;  Laterality: Left;  . TONSILLECTOMY    . WOUND DEBRIDEMENT Right 02/11/2019   Procedure: DEBRIDEMENT WOUND RIGHT BELOW THE KNEE STUMP;  Surgeon: Serafina Mitchell, MD;  Location: [facility] OR;  Service: Vascular;  Laterality: Right;    Current Outpatient Medications on File Prior to Visit  Medication Sig Dispense Refill  . albuterol (VENTOLIN HFA) 108 (90 Base) MCG/ACT inhaler Inhale 2 puffs into the lungs every 6 (six) hours as needed for wheezing or shortness of breath.     . ALPRAZolam (XANAX) 0.25 MG tablet Take 0.25 mg by mouth 2 (two) times daily as needed.    . ARTIFICIAL TEAR OP Place 1 drop into both eyes every 6 (six) hours as needed (dry eyes).    Marland Kitchen atorvastatin (LIPITOR) 80 MG tablet Take 1 tablet (80 mg total) by mouth at bedtime. (Patient taking differently: Take 80 mg by mouth daily.) 30 tablet  5  . bismuth subsalicylate (PEPTO BISMOL) 262 MG/15ML suspension Take 30 mLs by mouth every 6 (six) hours as needed for indigestion.    . calcium acetate (PHOSLO) 667 MG capsule Take 4 capsules (2,668 mg total) by mouth 3 (three) times daily with meals. (Patient taking differently: Take by mouth See admin instructions. Take 2 tabs with meals and 1334 with each snack) 360 capsule 0  . cinacalcet (SENSIPAR) 30 MG tablet Take 1 tablet (30 mg total) by mouth every evening. 60 tablet 0  . clopidogrel (PLAVIX) 75 MG tablet Take 1 tablet (75 mg total) by mouth daily. 30 tablet 0  . cyclobenzaprine (FLEXERIL) 10 MG tablet Take 10 mg by mouth 3 (three) times daily as needed.    . diphenhydrAMINE (BENADRYL) 25 MG tablet Take 25 mg by mouth daily as needed for itching.    . DULoxetine (CYMBALTA) 30 MG capsule Take 30 mg by mouth daily.    Marland Kitchen GLOBAL EASE  INJECT PEN NEEDLES 32G X 4 MM MISC TO BE USED DAILY WITH INSULIN    . ibuprofen (ADVIL) 200 MG tablet Take 400 mg by mouth 3 (three) times daily.    Marland Kitchen levothyroxine (SYNTHROID) 125 MCG tablet Take 1 tablet (125 mcg total) by mouth daily before breakfast. 30 tablet 0  . lisinopril (ZESTRIL) 10 MG tablet Take 10 mg by mouth daily.     . Melatonin 10 MG CAPS Take 10 mg by mouth at bedtime.    . midodrine (PROAMATINE) 10 MG tablet Take 10 mg by mouth Every Tuesday,Thursday,and Saturday with dialysis.    Marland Kitchen multivitamin (RENA-VIT) TABS tablet Take 1 tablet by mouth daily.    . polyethylene glycol (MIRALAX / GLYCOLAX) 17 g packet Take 17 g by mouth daily as needed for moderate constipation.    . pregabalin (LYRICA) 150 MG capsule Take 1 capsule (150 mg total) by mouth 3 (three) times daily. 90 capsule 0  . sevelamer carbonate (RENVELA) 800 MG tablet Take 1,600-3,200 mg by mouth See admin instructions. Take 3200 mg with each meal and 1600 mg with each snack    . topiramate (TOPAMAX) 25 MG tablet Take 2 tablets (50 mg total) by mouth 2 (two) times daily. To be given post dialysis, titrated as directed to patient 360 tablet 0  . TOUJEO SOLOSTAR 300 UNIT/ML SOPN Inject 54-78 Units as directed at bedtime as needed (if blood sugar is 130 or higher).      Current Facility-Administered Medications on File Prior to Visit  Medication Dose Route Frequency Provider Last Rate Last Admin  . 0.9 %  sodium chloride infusion  250 mL Intravenous PRN [name], MD       Physical exam:  Vitals:   09/22/20 1334  BP: 138/68  Pulse: 63  Resp: 20  Temp: 98.1 F (36.7 C)  SpO2: 95%  Weight: 238 lb (108 kg)  Height: 6\' 4"  (1.93 m)    Extremities: Well-healed right above-knee amputation, left lower extremity well-healed incisions dark spot tip of left first toe patient states is healing insistent with prior blister  Neuro: Symmetric upper extremity lower extremity motor strength 5/5  Data: I reviewed his  previous carotid duplex exam which shows greater than 80% left internal carotid artery stenosis.  Assessment: Patient would be a high risk left carotid endarterectomy candidate with prior history of coronary artery disease, renal failure with end-stage renal disease, diabetes and COPD.  He did have a cardiac stress test in 2021 which did not show  any significant ischemia.  Plan: We will schedule patient for CT angiogram of the head and neck to see if he is a candidate for TCAR carotid stenting.  If not he may need a left carotid endarterectomy.  I will call the patient regarding the findings of his CT angiogram which will be scheduled at [facility] in the next few weeks.  He will also need a graft duplex scan of his left leg in March 2022.  We will also place the patient on aspirin 81 mg once daily in addition to his Plavix and statin in preparation for the possibility of carotid stenting.  [name], MD [facility] Office: [phone]

## 2020-09-24 DIAGNOSIS — D631 Anemia in chronic kidney disease: Secondary | ICD-10-CM | POA: Diagnosis not present

## 2020-09-24 DIAGNOSIS — D509 Iron deficiency anemia, unspecified: Secondary | ICD-10-CM | POA: Diagnosis not present

## 2020-09-24 DIAGNOSIS — N186 End stage renal disease: Secondary | ICD-10-CM | POA: Diagnosis not present

## 2020-09-24 DIAGNOSIS — N2581 Secondary hyperparathyroidism of renal origin: Secondary | ICD-10-CM | POA: Diagnosis not present

## 2020-09-24 DIAGNOSIS — Z992 Dependence on renal dialysis: Secondary | ICD-10-CM | POA: Diagnosis not present

## 2020-09-26 ENCOUNTER — Other Ambulatory Visit: Payer: Self-pay

## 2020-09-26 ENCOUNTER — Telehealth: Payer: Self-pay | Admitting: Vascular Surgery

## 2020-09-26 ENCOUNTER — Ambulatory Visit (HOSPITAL_COMMUNITY): Payer: Medicare Other

## 2020-09-26 ENCOUNTER — Ambulatory Visit (HOSPITAL_COMMUNITY)
Admission: RE | Admit: 2020-09-26 | Discharge: 2020-09-26 | Disposition: A | Discharge status: Home / Self Care | Payer: Medicare Other | Source: Ambulatory Visit | Attending: Vascular Surgery | Admitting: Vascular Surgery

## 2020-09-26 DIAGNOSIS — I672 Cerebral atherosclerosis: Secondary | ICD-10-CM | POA: Diagnosis not present

## 2020-09-26 DIAGNOSIS — I6522 Occlusion and stenosis of left carotid artery: Secondary | ICD-10-CM | POA: Insufficient documentation

## 2020-09-26 LAB — POCT I-STAT CREATININE: Creatinine, Ser: 8.8 mg/dL — ABNORMAL HIGH (ref 0.61–1.24)

## 2020-09-26 MED ORDER — IOHEXOL 350 MG/ML SOLN
75.0000 mL | Freq: Once | INTRAVENOUS | Status: AC | PRN
  Administered 2020-09-26: 75 mL via INTRAVENOUS

## 2020-09-26 NOTE — Telephone Encounter (Signed)
Called patient to discuss results of CT angiogram of the head and neck today.  This shows a bulky calcified plaque at the left carotid bifurcation occurring at about the C4 vertebral body level.  There is a clamp double area distal to this.  The lesion extends over about 2 cm.  There is no significant right-sided stenosis.  The lesion is heavily calcified and I do not believe amenable to carotid stenting.  I discussed with the patient and his wife the possibility of left carotid endarterectomy for stroke prophylaxis.  Risk of stroke in the next year is on the order of about 5 to 10% without intervention.  Risk of stroke with operation will be 1 to 2%.  Patient does have multiple comorbidities.  His hemodialysis today is Tuesday Thursday Saturday.  We will try to schedule him for a Monday procedure.  I offered the patient February 21 as a possible date for left carotid endarterectomy.  He wants to discuss further with his sister so that she can be present for the operation.  He will call our office regarding scheduling.  He has been on Plavix for several years.  We recently started aspirin in addition to this in the event that he was going to need a carotid stent.  Since we are proceeding with carotid endarterectomy I told him he could stop his aspirin therapy and continue with just Plavix at this point.  He will call me in the near future to schedule left carotid endarterectomy.  [name], MD [facility] [place] Office: [phone]

## 2020-09-27 DIAGNOSIS — D631 Anemia in chronic kidney disease: Secondary | ICD-10-CM | POA: Diagnosis not present

## 2020-09-27 DIAGNOSIS — N186 End stage renal disease: Secondary | ICD-10-CM | POA: Diagnosis not present

## 2020-09-27 DIAGNOSIS — Z992 Dependence on renal dialysis: Secondary | ICD-10-CM | POA: Diagnosis not present

## 2020-09-27 DIAGNOSIS — N2581 Secondary hyperparathyroidism of renal origin: Secondary | ICD-10-CM | POA: Diagnosis not present

## 2020-09-27 DIAGNOSIS — D509 Iron deficiency anemia, unspecified: Secondary | ICD-10-CM | POA: Diagnosis not present

## 2020-09-29 DIAGNOSIS — Z992 Dependence on renal dialysis: Secondary | ICD-10-CM | POA: Diagnosis not present

## 2020-09-29 DIAGNOSIS — N186 End stage renal disease: Secondary | ICD-10-CM | POA: Diagnosis not present

## 2020-09-29 DIAGNOSIS — D631 Anemia in chronic kidney disease: Secondary | ICD-10-CM | POA: Diagnosis not present

## 2020-09-29 DIAGNOSIS — D509 Iron deficiency anemia, unspecified: Secondary | ICD-10-CM | POA: Diagnosis not present

## 2020-09-29 DIAGNOSIS — N2581 Secondary hyperparathyroidism of renal origin: Secondary | ICD-10-CM | POA: Diagnosis not present

## 2020-09-30 ENCOUNTER — Other Ambulatory Visit: Payer: Self-pay

## 2020-10-01 DIAGNOSIS — N2581 Secondary hyperparathyroidism of renal origin: Secondary | ICD-10-CM | POA: Diagnosis not present

## 2020-10-01 DIAGNOSIS — D631 Anemia in chronic kidney disease: Secondary | ICD-10-CM | POA: Diagnosis not present

## 2020-10-01 DIAGNOSIS — D509 Iron deficiency anemia, unspecified: Secondary | ICD-10-CM | POA: Diagnosis not present

## 2020-10-01 DIAGNOSIS — Z992 Dependence on renal dialysis: Secondary | ICD-10-CM | POA: Diagnosis not present

## 2020-10-01 DIAGNOSIS — N186 End stage renal disease: Secondary | ICD-10-CM | POA: Diagnosis not present

## 2020-10-04 ENCOUNTER — Encounter: Payer: Self-pay | Admitting: Physical Medicine & Rehabilitation

## 2020-10-04 DIAGNOSIS — Z992 Dependence on renal dialysis: Secondary | ICD-10-CM | POA: Diagnosis not present

## 2020-10-04 DIAGNOSIS — N2581 Secondary hyperparathyroidism of renal origin: Secondary | ICD-10-CM | POA: Diagnosis not present

## 2020-10-04 DIAGNOSIS — D509 Iron deficiency anemia, unspecified: Secondary | ICD-10-CM | POA: Diagnosis not present

## 2020-10-04 DIAGNOSIS — N186 End stage renal disease: Secondary | ICD-10-CM | POA: Diagnosis not present

## 2020-10-04 DIAGNOSIS — D631 Anemia in chronic kidney disease: Secondary | ICD-10-CM | POA: Diagnosis not present

## 2020-10-05 ENCOUNTER — Telehealth: Payer: Self-pay

## 2020-10-05 NOTE — Telephone Encounter (Signed)
Received a call from [place] at [facility] stating that Dr Aretta Nip recommended the patient see Dr Posey Pronto due to the patients condition and what Dr Posey Pronto treats.   She states she was calling to make Dr Tat aware she she sent the referral to Dr Letta Pate. The appointment is on 3.31.22 at 1:40 pm.

## 2020-10-06 DIAGNOSIS — N186 End stage renal disease: Secondary | ICD-10-CM | POA: Diagnosis not present

## 2020-10-06 DIAGNOSIS — D509 Iron deficiency anemia, unspecified: Secondary | ICD-10-CM | POA: Diagnosis not present

## 2020-10-06 DIAGNOSIS — Z992 Dependence on renal dialysis: Secondary | ICD-10-CM | POA: Diagnosis not present

## 2020-10-06 DIAGNOSIS — D631 Anemia in chronic kidney disease: Secondary | ICD-10-CM | POA: Diagnosis not present

## 2020-10-06 DIAGNOSIS — N2581 Secondary hyperparathyroidism of renal origin: Secondary | ICD-10-CM | POA: Diagnosis not present

## 2020-10-08 DIAGNOSIS — D509 Iron deficiency anemia, unspecified: Secondary | ICD-10-CM | POA: Diagnosis not present

## 2020-10-08 DIAGNOSIS — D631 Anemia in chronic kidney disease: Secondary | ICD-10-CM | POA: Diagnosis not present

## 2020-10-08 DIAGNOSIS — N2581 Secondary hyperparathyroidism of renal origin: Secondary | ICD-10-CM | POA: Diagnosis not present

## 2020-10-08 DIAGNOSIS — Z992 Dependence on renal dialysis: Secondary | ICD-10-CM | POA: Diagnosis not present

## 2020-10-08 DIAGNOSIS — N186 End stage renal disease: Secondary | ICD-10-CM | POA: Diagnosis not present

## 2020-10-10 DIAGNOSIS — N186 End stage renal disease: Secondary | ICD-10-CM | POA: Diagnosis not present

## 2020-10-10 DIAGNOSIS — Z992 Dependence on renal dialysis: Secondary | ICD-10-CM | POA: Diagnosis not present

## 2020-10-11 DIAGNOSIS — D631 Anemia in chronic kidney disease: Secondary | ICD-10-CM | POA: Diagnosis not present

## 2020-10-11 DIAGNOSIS — N2581 Secondary hyperparathyroidism of renal origin: Secondary | ICD-10-CM | POA: Diagnosis not present

## 2020-10-11 DIAGNOSIS — D509 Iron deficiency anemia, unspecified: Secondary | ICD-10-CM | POA: Diagnosis not present

## 2020-10-11 DIAGNOSIS — S39012A Strain of muscle, fascia and tendon of lower back, initial encounter: Secondary | ICD-10-CM | POA: Diagnosis not present

## 2020-10-11 DIAGNOSIS — N23 Unspecified renal colic: Secondary | ICD-10-CM | POA: Diagnosis not present

## 2020-10-11 DIAGNOSIS — N186 End stage renal disease: Secondary | ICD-10-CM | POA: Diagnosis not present

## 2020-10-11 DIAGNOSIS — Z794 Long term (current) use of insulin: Secondary | ICD-10-CM | POA: Diagnosis not present

## 2020-10-11 DIAGNOSIS — E119 Type 2 diabetes mellitus without complications: Secondary | ICD-10-CM | POA: Diagnosis not present

## 2020-10-11 DIAGNOSIS — Z992 Dependence on renal dialysis: Secondary | ICD-10-CM | POA: Diagnosis not present

## 2020-10-12 DIAGNOSIS — N200 Calculus of kidney: Secondary | ICD-10-CM | POA: Diagnosis not present

## 2020-10-12 DIAGNOSIS — I714 Abdominal aortic aneurysm, without rupture: Secondary | ICD-10-CM | POA: Diagnosis not present

## 2020-10-12 DIAGNOSIS — K5750 Diverticulosis of both small and large intestine without perforation or abscess without bleeding: Secondary | ICD-10-CM | POA: Diagnosis not present

## 2020-10-12 DIAGNOSIS — N400 Enlarged prostate without lower urinary tract symptoms: Secondary | ICD-10-CM | POA: Diagnosis not present

## 2020-10-13 DIAGNOSIS — N2581 Secondary hyperparathyroidism of renal origin: Secondary | ICD-10-CM | POA: Diagnosis not present

## 2020-10-13 DIAGNOSIS — Z992 Dependence on renal dialysis: Secondary | ICD-10-CM | POA: Diagnosis not present

## 2020-10-13 DIAGNOSIS — D509 Iron deficiency anemia, unspecified: Secondary | ICD-10-CM | POA: Diagnosis not present

## 2020-10-13 DIAGNOSIS — N186 End stage renal disease: Secondary | ICD-10-CM | POA: Diagnosis not present

## 2020-10-13 DIAGNOSIS — D631 Anemia in chronic kidney disease: Secondary | ICD-10-CM | POA: Diagnosis not present

## 2020-10-15 DIAGNOSIS — D631 Anemia in chronic kidney disease: Secondary | ICD-10-CM | POA: Diagnosis not present

## 2020-10-15 DIAGNOSIS — N186 End stage renal disease: Secondary | ICD-10-CM | POA: Diagnosis not present

## 2020-10-15 DIAGNOSIS — D509 Iron deficiency anemia, unspecified: Secondary | ICD-10-CM | POA: Diagnosis not present

## 2020-10-15 DIAGNOSIS — Z992 Dependence on renal dialysis: Secondary | ICD-10-CM | POA: Diagnosis not present

## 2020-10-15 DIAGNOSIS — N2581 Secondary hyperparathyroidism of renal origin: Secondary | ICD-10-CM | POA: Diagnosis not present

## 2020-10-18 DIAGNOSIS — Z992 Dependence on renal dialysis: Secondary | ICD-10-CM | POA: Diagnosis not present

## 2020-10-18 DIAGNOSIS — N2581 Secondary hyperparathyroidism of renal origin: Secondary | ICD-10-CM | POA: Diagnosis not present

## 2020-10-18 DIAGNOSIS — D631 Anemia in chronic kidney disease: Secondary | ICD-10-CM | POA: Diagnosis not present

## 2020-10-18 DIAGNOSIS — D509 Iron deficiency anemia, unspecified: Secondary | ICD-10-CM | POA: Diagnosis not present

## 2020-10-18 DIAGNOSIS — N186 End stage renal disease: Secondary | ICD-10-CM | POA: Diagnosis not present

## 2020-10-19 NOTE — Progress Notes (Signed)
Surgical Instructions  Your procedure is scheduled on Friday, March 14th.  Report to [facility] Main Entrance "A" at 9:10 A.M., then check in with the Admitting office.  Call this number if you have problems the morning of surgery:  [phone]   If you have any questions prior to your surgery date call [phone]: Open Monday-Friday 8am-4pm   Remember:  Do not eat or drink after midnight the night before your surgery    Take these medicines the morning of surgery with A SIP OF WATER  atorvastatin (LIPITOR) DULoxetine (CYMBALTA)  levothyroxine (SYNTHROID)  pregabalin (LYRICA)   If needed: ALPRAZolam Duanne Moron), cyclobenzaprine (FLEXERIL),  albuterol (VENTOLIN HFA) 108 (90 Base)/inhaler -bring with you the morning of surgery   Follow your surgeon's instructions on when to stop clopidogrel (PLAVIX).  If no instructions were given by your surgeon then you will need to call the office to get those instructions.    As of today, STOP taking any Aspirin (unless otherwise instructed by your surgeon) Aleve, Naproxen, Ibuprofen, Motrin, Advil, Goody's, BC's, all herbal medications, fish oil, and all vitamins.          WHAT DO I DO ABOUT MY DIABETES MEDICATION?  THE NIGHT BEFORE SURGERY      . If your CBG is greater than 220 mg/dL, you may take  of your TOUJEO SOLOSTAR/sliding scale (correction) dose of insulin (20-39 units) .  THE MORNING OF SURGERY      . If your CBG is greater than 220 mg/dL, you may take  of your TOUJEO SOLOSTAR/sliding scale (correction) dose of insulin (20-39 units) .  HOW TO MANAGE YOUR DIABETES BEFORE AND AFTER SURGERY  Why is it important to control my blood sugar before and after surgery? . Improving blood sugar levels before and after surgery helps healing and can limit problems. . A way of improving blood sugar control is eating a healthy diet by: o  Eating less sugar and carbohydrates o  Increasing activity/exercise o  Talking with your doctor about  reaching your blood sugar goals . High blood sugars (greater than 180 mg/dL) can raise your risk of infections and slow your recovery, so you will need to focus on controlling your diabetes during the weeks before surgery. . Make sure that the doctor who takes care of your diabetes knows about your planned surgery including the date and location.  How do I manage my blood sugar before surgery? . Check your blood sugar at least 4 times a day, starting 2 days before surgery, to make sure that the level is not too high or low. . Check your blood sugar the morning of your surgery when you wake up and every 2 hours until you get to the Short Stay unit. o If your blood sugar is less than 70 mg/dL, you will need to treat for low blood sugar: - Do not take insulin. - Treat a low blood sugar (less than 70 mg/dL) with  cup of clear juice (cranberry or apple), 4 glucose tablets, OR glucose gel. - Recheck blood sugar in 15 minutes after treatment (to make sure it is greater than 70 mg/dL). If your blood sugar is not greater than 70 mg/dL on recheck, call [phone] for further instructions. . Report your blood sugar to the short stay nurse when you get to Short Stay.  . If you are admitted to the hospital after surgery: o Your blood sugar will be checked by the staff and you will probably be given insulin after  surgery (instead of oral diabetes medicines) to make sure you have good blood sugar levels. o The goal for blood sugar control after surgery is 80-180 mg/dL.             Do not wear jewelry.            Do not wear lotions, powders, colognes, or deodorant.            Men may shave face and neck.            Do not bring valuables to the hospital.            [facility] is not responsible for any belongings or valuables.  Do NOT Smoke (Tobacco/Vaping) or drink Alcohol 24 hours prior to your procedure If you use a CPAP at night, you may bring all equipment for your overnight stay.   Contacts,  glasses, dentures or bridgework may not be worn into surgery, please bring cases for these belongings   For patients admitted to the hospital, discharge time will be determined by your treatment team.   Patients discharged the day of surgery will not be allowed to drive home, and someone needs to stay with them for 24 hours.  Special instructions:   [facility]- Preparing For Surgery  Before surgery, you can play an important role. Because skin is not sterile, your skin needs to be as free of germs as possible. You can reduce the number of germs on your skin by washing with CHG (chlorahexidine gluconate) Soap before surgery.  CHG is an antiseptic cleaner which kills germs and bonds with the skin to continue killing germs even after washing.    Oral Hygiene is also important to reduce your risk of infection.  Remember - BRUSH YOUR TEETH THE MORNING OF SURGERY WITH YOUR REGULAR TOOTHPASTE  Please do not use if you have an allergy to CHG or antibacterial soaps. If your skin becomes reddened/irritated stop using the CHG.  Do not shave (including legs and underarms) for at least 48 hours prior to first CHG shower. It is OK to shave your face.  Please follow these instructions carefully.   1. Shower the NIGHT BEFORE SURGERY and the MORNING OF SURGERY  2. If you chose to wash your hair, wash your hair first as usual with your normal shampoo. After you shampoo, rinse your hair and body thoroughly to remove the shampoo.  3. Wash Face and genitals (private parts) with your normal soap.   4. Use CHG Soap as you would any other liquid soap. You can apply CHG directly to the skin and wash gently with a scrungie or a clean washcloth.   5. Apply the CHG Soap to your body ONLY FROM THE NECK DOWN.  Do not use on open wounds or open sores. Avoid contact with your eyes, ears, mouth and genitals (private parts). Wash Face and genitals (private parts)  with your normal soap.   6. Wash thoroughly, paying  special attention to the area where your surgery will be performed.  7. Thoroughly rinse your body with warm water from the neck down.  8. DO NOT shower/wash with your normal soap after using and rinsing off the CHG Soap.  9. Pat yourself dry with a CLEAN TOWEL.  10. Wear CLEAN PAJAMAS to bed the night before surgery  11. Place CLEAN SHEETS on your bed the night before your surgery  12. DO NOT SLEEP WITH PETS.  Day of Surgery: Shower with CHG soap  Wear Clean/Comfortable clothing the morning of surgery Do not apply any deodorants/lotions.   Remember to brush your teeth WITH YOUR REGULAR TOOTHPASTE.   Please read over the following fact sheets that you were given.

## 2020-10-20 ENCOUNTER — Encounter (HOSPITAL_COMMUNITY): Payer: Self-pay

## 2020-10-20 ENCOUNTER — Other Ambulatory Visit (HOSPITAL_COMMUNITY)
Admission: RE | Admit: 2020-10-20 | Discharge: 2020-10-20 | Disposition: A | Discharge status: Home / Self Care | Payer: Medicare Other | Source: Ambulatory Visit | Attending: Vascular Surgery | Admitting: Vascular Surgery

## 2020-10-20 ENCOUNTER — Encounter (HOSPITAL_COMMUNITY)
Admission: RE | Admit: 2020-10-20 | Discharge: 2020-10-20 | Disposition: A | Discharge status: Home / Self Care | Payer: Medicare Other | Source: Ambulatory Visit | Attending: Vascular Surgery | Admitting: Vascular Surgery

## 2020-10-20 ENCOUNTER — Other Ambulatory Visit: Payer: Self-pay

## 2020-10-20 DIAGNOSIS — Z992 Dependence on renal dialysis: Secondary | ICD-10-CM | POA: Diagnosis not present

## 2020-10-20 DIAGNOSIS — Z20822 Contact with and (suspected) exposure to covid-19: Secondary | ICD-10-CM | POA: Insufficient documentation

## 2020-10-20 DIAGNOSIS — N2581 Secondary hyperparathyroidism of renal origin: Secondary | ICD-10-CM | POA: Diagnosis not present

## 2020-10-20 DIAGNOSIS — Z01812 Encounter for preprocedural laboratory examination: Secondary | ICD-10-CM | POA: Insufficient documentation

## 2020-10-20 DIAGNOSIS — D631 Anemia in chronic kidney disease: Secondary | ICD-10-CM | POA: Diagnosis not present

## 2020-10-20 DIAGNOSIS — D509 Iron deficiency anemia, unspecified: Secondary | ICD-10-CM | POA: Diagnosis not present

## 2020-10-20 DIAGNOSIS — N186 End stage renal disease: Secondary | ICD-10-CM | POA: Diagnosis not present

## 2020-10-20 LAB — CBC
HCT: 29.7 % — ABNORMAL LOW (ref 39.0–52.0)
Hemoglobin: 9.5 g/dL — ABNORMAL LOW (ref 13.0–17.0)
MCH: 32.1 pg (ref 26.0–34.0)
MCHC: 32 g/dL (ref 30.0–36.0)
MCV: 100.3 fL — ABNORMAL HIGH (ref 80.0–100.0)
Platelets: 137 10*3/uL — ABNORMAL LOW (ref 150–400)
RBC: 2.96 MIL/uL — ABNORMAL LOW (ref 4.22–5.81)
RDW: 16 % — ABNORMAL HIGH (ref 11.5–15.5)
WBC: 4.2 10*3/uL (ref 4.0–10.5)
nRBC: 0 % (ref 0.0–0.2)

## 2020-10-20 LAB — TYPE AND SCREEN
ABO/RH(D): A POS
Antibody Screen: NEGATIVE

## 2020-10-20 LAB — HEMOGLOBIN A1C
Hgb A1c MFr Bld: 6.2 % — ABNORMAL HIGH (ref 4.8–5.6)
Mean Plasma Glucose: 131.24 mg/dL

## 2020-10-20 LAB — COMPREHENSIVE METABOLIC PANEL
ALT: 24 U/L (ref 0–44)
AST: 29 U/L (ref 15–41)
Albumin: 3.9 g/dL (ref 3.5–5.0)
Alkaline Phosphatase: 84 U/L (ref 38–126)
Anion gap: 11 (ref 5–15)
BUN: 17 mg/dL (ref 8–23)
CO2: 32 mmol/L (ref 22–32)
Calcium: 9.1 mg/dL (ref 8.9–10.3)
Chloride: 97 mmol/L — ABNORMAL LOW (ref 98–111)
Creatinine, Ser: 4.47 mg/dL — ABNORMAL HIGH (ref 0.61–1.24)
GFR, Estimated: 13 mL/min — ABNORMAL LOW (ref >60)
Glucose, Bld: 105 mg/dL — ABNORMAL HIGH (ref 70–99)
Potassium: 4 mmol/L (ref 3.5–5.1)
Sodium: 140 mmol/L (ref 135–145)
Total Bilirubin: 0.5 mg/dL (ref 0.3–1.2)
Total Protein: 7.1 g/dL (ref 6.5–8.1)

## 2020-10-20 LAB — SURGICAL PCR SCREEN
MRSA, PCR: NEGATIVE
Staphylococcus aureus: NEGATIVE

## 2020-10-20 LAB — PROTIME-INR
INR: 1.2 (ref 0.8–1.2)
Prothrombin Time: 15 seconds (ref 11.4–15.2)

## 2020-10-20 LAB — GLUCOSE, CAPILLARY: Glucose-Capillary: 109 mg/dL — ABNORMAL HIGH (ref 70–99)

## 2020-10-20 LAB — SARS CORONAVIRUS 2 (TAT 6-24 HRS): SARS Coronavirus 2: NEGATIVE

## 2020-10-20 LAB — APTT: aPTT: 38 seconds — ABNORMAL HIGH (ref 24–36)

## 2020-10-20 NOTE — Progress Notes (Addendum)
PCP - Dr. Charletta Cousin  Cardiologist - Dr. Myles Gip  Chest x-ray - Denies  EKG - 01/06/20 (E)  Stress Test - 04/01/20 (E)  ECHO - 02/02/20 (E)  Cardiac Cath - Denies  AICD-na PM-na LOOP-na  Sleep Study - Denies CPAP - Denies  LABS- 10/20/20: CBC, CMP, PT, PTT, T/S, PCR, COVID- No UA- pt on dialysis T-TH-Sat  ASA- Denies Plavix- LD- 3/10  ERAS- No  HA1C- 10/20/20 Fasting Blood Sugar - 65-197, today 109 Checks Blood Sugar __2___ times a day  Anesthesia-No  Pt denies having chest pain, sob, or fever at this time. All instructions explained to the pt, with a verbal understanding of the material. Pt agrees to go over the instructions while at home for a better understanding. Pt also instructed to self quarantine after being tested for COVID-19. The opportunity to ask questions was provided.   Coronavirus Screening  Have you experienced the following symptoms:  Cough yes/no: No Fever (>100.103F)  yes/no: No Runny nose yes/no: No Sore throat yes/no: No Difficulty breathing/shortness of breath  yes/no: No  Have you or a family member traveled in the last 14 days and where? yes/no: No   If the patient indicates "YES" to the above questions, their PAT will be rescheduled to limit the exposure to others and, the surgeon will be notified. THE PATIENT WILL NEED TO BE ASYMPTOMATIC FOR 14 DAYS.   If the patient is not experiencing any of these symptoms, the PAT nurse will instruct them to NOT bring anyone with them to their appointment since they may have these symptoms or traveled as well.   Please remind your patients and families that hospital visitation restrictions are in effect and the importance of the restrictions.

## 2020-10-20 NOTE — Progress Notes (Signed)
Anesthesia Chart Review:  Pertinent history includes insulin-dependent diabetes mellitus,end-stage renal disease on hemodialysis T Th S,hypertension, COPD, GERD, hyperlipidemia,lower extremity neuropathy, andperipheral vascular disease and is status post right AKA, most recently underwent left femoral endarterectomy and left femoral to below-knee popliteal bypass with vein August 2021.   Recently referred to cardiology by PCP for dysrhythmia.  Seen by Dr. Bronson Ing 01/06/2020.  Per his note patient had ECG demonstrating ventricular bigeminy.  He was also noted to have a murmur on exam.  Echo was ordered which showed EF 55 to 60%, grade 1 DD, mild AS with mean gradient 13 mmHg.  Due to inability to achieve 4 METS of activity a Lexiscan Myoview was ordered for preop risk stratification prior to undergoing femoral endarterectomy.  Test was done 04/01/2020 and showed large size, severe severity fixed inferior perfusion defect consistent with infarct/scar or much less likely artifact related to RBBB.  No significant reversible ischemia.  LVEF 39% with basal to mid inferior akinesis.  This is an intermediate risk study. Clayton Dopp, PA-C commented on stress test result stating, "The stress test shows evidence of a probable heart attack in the past. With his known disease in his legs requiring surgery, this is not a surprise. He does not have any evidence of ischemia. The EF on this study is inaccurate. His EF on the echocardiogram in June 2021 was normal. I reviewed the study with one of our cardiologists who agreed. No further testing is needed at this time. He may proceed with his surgery. He will need follow up with one of our MDs in the [facility]."  Pt was last seen by Dr. Domenic Polite 07/04/20. Per note, "CAD based on Myoview from August demonstrating large inferior wall scar but no active ischemia.  LVEF 55 to 60% by echocardiogram.  He does not report any active angina.  Continue Plavix, lisinopril,  and Lipitor." 6 month followup recommended.   Pt to continue plavix per [facility].  IDDMII, last A1c 6.2 on 10/20/20.   Preop labs reviewed, anemia with hemoglobin 9.5, creatinine 4.47 consistent with history of ESRD.  EKG 01/06/20: Sinus  Rhythm  Rate 69 -Frequent pvcs -ventricular bigeminy -Right bundle branch block.   CTA Head/neck 09/26/20: IMPRESSION: 1. Heavily calcified plaque in the left carotid bifurcation resulting in approximately 80% stenosis. 2. No hemodynamically significant stenosis in the right carotid bifurcation. 3. No significant intracranial stenosis.  Nuclear stress 04/01/2020:  The left ventricular ejection fraction is moderately decreased (30-44%).  Nuclear stress EF: 39%.  No T wave inversion was noted during stress.  There was no ST segment deviation noted during stress.  Defect 1: There is a large defect of severe severity present in the mid inferior and apical inferior location.  Findings consistent with prior myocardial infarction.  This is an intermediate risk study.  Large size, severe severity fixed inferior perfusion defect consistent with infarct/scar or much less likely artifact related to RBBB. No significant reversible ischemia. LVEF 39% with basal to mid inferior akinesis. This is an intermediate risk study.   TTE 02/02/2020: 1. Left ventricular ejection fraction, by estimation, is 55 to 60%. The  left ventricle has normal function. The left ventricle has no regional  wall motion abnormalities. The left ventricular internal cavity size was  mildly dilated. There is moderate  left ventricular hypertrophy. Left ventricular diastolic parameters are  consistent with Grade I diastolic dysfunction (impaired relaxation).  2. Right ventricular systolic function is normal. The right ventricular  size is normal. There  is normal pulmonary artery systolic pressure. The  estimated right ventricular systolic pressure is 33.1 mmHg.  3. Left atrial  size was mildly dilated.  4. The mitral valve is grossly normal, mildly calcified annulus. Trivial  mitral valve regurgitation.  5. The aortic valve is tricuspid. Aortic valve regurgitation is not  visualized. Mild aortic valve stenosis. Aortic valve area, by VTI measures  1.49 cm. Aortic valve mean gradient measures 13.0 mmHg.  6. The inferior vena cava is normal in size with greater than 50%  respiratory variability, suggesting right atrial pressure of 3 mmHg.     Wynonia Musty [facility]/Anesthesiology Phone [phone] 10/21/2020 11:29 AM

## 2020-10-20 NOTE — Progress Notes (Signed)
Your procedure is scheduled on Friday, March 14th.             Report to [facility] Main Entrance "A" at 9:10 A.M., then check in with the Admitting office.             Call this number if you have problems the morning of surgery:             [phone]   If you have any questions prior to your surgery date call [phone]: Open Monday-Friday 8am-4pm              Remember:             Do not eat or drink after midnight the night before your surgery                          Take these medicines the morning of surgery with A SIP OF WATER  atorvastatin (LIPITOR) DULoxetine (CYMBALTA)  levothyroxine (SYNTHROID)  pregabalin (LYRICA)   If needed: ALPRAZolam Duanne Moron), cyclobenzaprine (FLEXERIL),  albuterol (VENTOLIN HFA) 108 (90 Base)/inhaler -bring with you the morning of surgery   Follow your surgeon's instructions on when to stop clopidogrel (PLAVIX).  If no instructions were given by your surgeon then you will need to call the office to get those instructions.    As of today, STOP taking any Aspirin (unless otherwise instructed by your surgeon) Aleve, Naproxen, Ibuprofen, Motrin, Advil, Goody's, BC's, all herbal medications, fish oil, and all vitamins.  WHAT DO I DO ABOUT MY DIABETES MEDICATION?  THE NIGHT BEFORE SURGERY                                             TOUJEO SOLOSTAR (20-39 units)- If the blood sugar is greater than 130.  THE MORNING OF SURGERY                                                 TOUJEO SOLOSTAR (20-39 units)- If the blood sugar is greater than 130  HOW TO MANAGE YOUR DIABETES BEFORE AND AFTER SURGERY   Check your blood sugar the morning of your surgery when you wake up and every 2 hours until you get to the Short Stay unit. ? If your blood sugar is less than 70 mg/dL, you will need to treat for low blood sugar:  Do not take insulin.  Treat a low blood sugar (less than 70 mg/dL) with  cup of clear juice (cranberry or apple),  4glucose tablets, OR glucose gel.  Recheck blood sugar in 15 minutes after treatment (to make sure it is greater than 70 mg/dL). If your blood sugar is not greater than 70 mg/dL on recheck, call [phone] for further instructions.  Report your blood sugar to the short stay nurse when you get to Short Stay.  Do not wear jewelry. Do notwear lotions, powders, colognes, or deodorant. Men may shave face and neck. Do notbring valuables to the hospital. [facility] is not responsible for any belongings or valuables.  Do NOT Smoke (Tobacco/Vaping) or drink Alcohol 24 hours prior to your procedure If you use a CPAP at night, you may bring all equipment for your overnight stay.  Contacts,  glasses, dentures or bridgework may not be worn into surgery, please bring cases for these belongings  For patients admitted to the hospital, discharge time will be determined by your treatment team.  Patients discharged the day of surgery will not be allowed to drive home, and someone needs to stay with them for 24 hours.  Special instructions:   [facility]- Preparing For Surgery  Before surgery, you can play an important role. Because skin is not sterile, your skin needs to be as free of germs as possible. You can reduce the number of germs on your skin by washing with CHG (chlorahexidine gluconate) Soap before surgery.  CHG is an antiseptic cleaner which kills germs and bonds with the skin to continue killing germs even after washing.    Oral Hygiene is also important to reduce your risk of infection.  Remember - BRUSH YOUR TEETH THE MORNING OF SURGERY WITH YOUR REGULAR TOOTHPASTE  Please do not use if you have an allergy to CHG or antibacterial soaps. If your skin becomes reddened/irritated stop using the CHG.  Do not shave (including legs and underarms) for at least 48 hours prior to first CHG shower. It is OK to shave your  face.  Please follow these instructions carefully.                                                                                                                               1. Shower the NIGHT BEFORE SURGERY and the MORNING OF SURGERY  2. If you chose to wash your hair, wash your hair first as usual with your normal shampoo. After you shampoo, rinse your hair and body thoroughly to remove the shampoo.  3. Wash Face and genitals (private parts) with your normal soap.   4. Use CHG Soap as you would any other liquid soap. You can apply CHG directly to the skin and wash gently with a scrungie or a clean washcloth.   5. Apply the CHG Soap to your body ONLY FROM THE NECK DOWN.  Do not use on open wounds or open sores. Avoid contact with your eyes, ears, mouth and genitals (private parts). Wash Face and genitals (private parts)  with your normal soap.   6. Wash thoroughly, paying special attention to the area where your surgery will be performed.  7. Thoroughly rinse your body with warm water from the neck down.  8. DO NOT shower/wash with your normal soap after using and rinsing off the CHG Soap.  9. Pat yourself dry with a CLEAN TOWEL.  10. Wear CLEAN PAJAMAS to bed the night before surgery  11. Place CLEAN SHEETS on your bed the night before your surgery  12. DO NOT SLEEP WITH PETS.  Day of Surgery: Shower with CHG soap  Wear Clean/Comfortable clothing the morning of surgery Do notapply any deodorants/lotions.  Remember to brush your teeth WITH YOUR REGULAR TOOTHPASTE.  Please read over the following fact sheets that  you were given.

## 2020-10-21 ENCOUNTER — Telehealth: Payer: Self-pay

## 2020-10-21 NOTE — Anesthesia Preprocedure Evaluation (Addendum)
Anesthesia Evaluation  Patient identified by MRN, date of birth, ID band Patient awake    Reviewed: Allergy & Precautions, NPO status , Patient's Chart, lab work & pertinent test results  Airway Mallampati: II  TM Distance: >3 FB Neck ROM: Full    Dental no notable dental hx.    Pulmonary COPD, former smoker,    Pulmonary exam normal breath sounds clear to auscultation       Cardiovascular hypertension, + Peripheral Vascular Disease  Normal cardiovascular exam Rhythm:Regular Rate:Normal     Neuro/Psych negative neurological ROS  negative psych ROS   GI/Hepatic Neg liver ROS, GERD  ,  Endo/Other  diabetesHypothyroidism   Renal/GU DialysisRenal disease  negative genitourinary   Musculoskeletal negative musculoskeletal ROS (+)   Abdominal   Peds negative pediatric ROS (+)  Hematology negative hematology ROS (+)   Anesthesia Other Findings   Reproductive/Obstetrics negative OB ROS                            Anesthesia Physical Anesthesia Plan  ASA: III  Anesthesia Plan: General   Post-op Pain Management:    Induction: Intravenous  PONV Risk Score and Plan: 2 and Ondansetron, Dexamethasone and Treatment may vary due to age or medical condition  Airway Management Planned: Oral ETT  Additional Equipment: Arterial line  Intra-op Plan:   Post-operative Plan: Extubation in OR  Informed Consent: I have reviewed the patients History and Physical, chart, labs and discussed the procedure including the risks, benefits and alternatives for the proposed anesthesia with the patient or authorized representative who has indicated his/her understanding and acceptance.     Dental advisory given  Plan Discussed with: CRNA and Surgeon  Anesthesia Plan Comments: (PAT note by Karoline Caldwell, PA-C: Pertinent history includes insulin-dependent diabetes mellitus,end-stage renal disease on  hemodialysis T Th S,hypertension, COPD, GERD, hyperlipidemia,lower extremity neuropathy, andperipheral vascular disease and is status post right AKA, most recently underwent left femoral endarterectomy and left femoral to below-knee popliteal bypass with vein August 2021.   Recently referred to cardiology by PCP for dysrhythmia.  Seen by Dr. Bronson Ing 01/06/2020.  Per his note patient had ECG demonstrating ventricular bigeminy.  He was also noted to have a murmur on exam.  Echo was ordered which showed EF 55 to 60%, grade 1 DD, mild AS with mean gradient 13 mmHg.  Due to inability to achieve 4 METS of activity a Lexiscan Myoview was ordered for preop risk stratification prior to undergoing femoral endarterectomy.  Test was done 04/01/2020 and showed large size, severe severity fixed inferior perfusion defect consistent with infarct/scar or much less likely artifact related to RBBB.  No significant reversible ischemia.  LVEF 39% with basal to mid inferior akinesis.  This is an intermediate risk study. [name], PA-C commented on stress test result stating, "The stress test shows evidence of a probable heart attack in the past. With his known disease in his legs requiring surgery, this is not a surprise. He does not have any evidence of ischemia. The EF on this study is inaccurate. His EF on the echocardiogram in June 2021 was normal. I reviewed the study with one of our cardiologists who agreed. No further testing is needed at this time. He may proceed with his surgery. He will need follow up with one of our MDs in the [facility]."  Pt was last seen by Dr. Domenic Polite 07/04/20. Per note, "CAD based on Myoview from August demonstrating large  inferior wall scar but no active ischemia.  LVEF 55 to 60% by echocardiogram.  He does not report any active angina.  Continue Plavix, lisinopril, and Lipitor." 6 month followup recommended.   Pt to continue plavix per [facility].  IDDMII, last A1c 6.2 on 10/20/20.    Preop labs reviewed, anemia with hemoglobin 9.5, creatinine 4.47 consistent with history of ESRD.  EKG 01/06/20: Sinus  Rhythm  Rate 69 -Frequent pvcs -ventricular bigeminy -Right bundle branch block.   CTA Head/neck 09/26/20: IMPRESSION: 1. Heavily calcified plaque in the left carotid bifurcation resulting in approximately 80% stenosis. 2. No hemodynamically significant stenosis in the right carotid bifurcation. 3. No significant intracranial stenosis.  Nuclear stress 04/01/2020: The left ventricular ejection fraction is moderately decreased (30-44%). Nuclear stress EF: 39%. No T wave inversion was noted during stress. There was no ST segment deviation noted during stress. Defect 1: There is a large defect of severe severity present in the mid inferior and apical inferior location. Findings consistent with prior myocardial infarction. This is an intermediate risk study.  Large size, severe severity fixed inferior perfusion defect consistent with infarct/scar or much less likely artifact related to RBBB. No significant reversible ischemia. LVEF 39% with basal to mid inferior akinesis. This is an intermediate risk study.   TTE 02/02/2020: 1. Left ventricular ejection fraction, by estimation, is 55 to 60%. The  left ventricle has normal function. The left ventricle has no regional  wall motion abnormalities. The left ventricular internal cavity size was  mildly dilated. There is moderate  left ventricular hypertrophy. Left ventricular diastolic parameters are  consistent with Grade I diastolic dysfunction (impaired relaxation).  2. Right ventricular systolic function is normal. The right ventricular  size is normal. There is normal pulmonary artery systolic pressure. The  estimated right ventricular systolic pressure is 84.5 mmHg.  3. Left atrial size was mildly dilated.  4. The mitral valve is grossly normal, mildly calcified annulus. Trivial  mitral valve regurgitation.   5. The aortic valve is tricuspid. Aortic valve regurgitation is not  visualized. Mild aortic valve stenosis. Aortic valve area, by VTI measures  1.49 cm. Aortic valve mean gradient measures 13.0 mmHg.  6. The inferior vena cava is normal in size with greater than 50%  respiratory variability, suggesting right atrial pressure of 3 mmHg.   )       Anesthesia Quick Evaluation

## 2020-10-21 NOTE — Telephone Encounter (Signed)
Received call from Karoline Caldwell, [place] stating pt had some confusion on whether to continue or stop taking Plavix prior to CEA surgery on 3/14.   Spoke with pt who stated someone from preadmissions advised him not to take Plavix. Advised pt he is to continue taking Plavix prior to surgery as we previously discussed. Pt verbalized understanding.

## 2020-10-22 DIAGNOSIS — N186 End stage renal disease: Secondary | ICD-10-CM | POA: Diagnosis not present

## 2020-10-22 DIAGNOSIS — D509 Iron deficiency anemia, unspecified: Secondary | ICD-10-CM | POA: Diagnosis not present

## 2020-10-22 DIAGNOSIS — N2581 Secondary hyperparathyroidism of renal origin: Secondary | ICD-10-CM | POA: Diagnosis not present

## 2020-10-22 DIAGNOSIS — Z992 Dependence on renal dialysis: Secondary | ICD-10-CM | POA: Diagnosis not present

## 2020-10-22 DIAGNOSIS — D631 Anemia in chronic kidney disease: Secondary | ICD-10-CM | POA: Diagnosis not present

## 2020-10-24 ENCOUNTER — Inpatient Hospital Stay (HOSPITAL_COMMUNITY)
Admission: RE | Admit: 2020-10-24 | Discharge: 2020-10-25 | DRG: 037 | Disposition: A | Discharge status: Home / Self Care | Payer: Medicare Other | Attending: Vascular Surgery | Admitting: Vascular Surgery

## 2020-10-24 ENCOUNTER — Other Ambulatory Visit: Payer: Self-pay

## 2020-10-24 ENCOUNTER — Inpatient Hospital Stay (HOSPITAL_COMMUNITY): Payer: Medicare Other | Admitting: Physician Assistant

## 2020-10-24 ENCOUNTER — Encounter (HOSPITAL_COMMUNITY)
Admission: RE | Disposition: A | Discharge status: Home / Self Care | Payer: Self-pay | Source: Home / Self Care | Attending: Vascular Surgery

## 2020-10-24 ENCOUNTER — Encounter (HOSPITAL_COMMUNITY): Payer: Self-pay | Admitting: Vascular Surgery

## 2020-10-24 ENCOUNTER — Inpatient Hospital Stay (HOSPITAL_COMMUNITY): Payer: Medicare Other | Admitting: Certified Registered Nurse Anesthetist

## 2020-10-24 DIAGNOSIS — E1151 Type 2 diabetes mellitus with diabetic peripheral angiopathy without gangrene: Secondary | ICD-10-CM | POA: Diagnosis present

## 2020-10-24 DIAGNOSIS — Z7902 Long term (current) use of antithrombotics/antiplatelets: Secondary | ICD-10-CM

## 2020-10-24 DIAGNOSIS — Z79890 Hormone replacement therapy (postmenopausal): Secondary | ICD-10-CM

## 2020-10-24 DIAGNOSIS — L97829 Non-pressure chronic ulcer of other part of left lower leg with unspecified severity: Secondary | ICD-10-CM | POA: Diagnosis not present

## 2020-10-24 DIAGNOSIS — I6529 Occlusion and stenosis of unspecified carotid artery: Secondary | ICD-10-CM | POA: Diagnosis not present

## 2020-10-24 DIAGNOSIS — R112 Nausea with vomiting, unspecified: Secondary | ICD-10-CM | POA: Diagnosis not present

## 2020-10-24 DIAGNOSIS — Z79899 Other long term (current) drug therapy: Secondary | ICD-10-CM

## 2020-10-24 DIAGNOSIS — N186 End stage renal disease: Secondary | ICD-10-CM | POA: Diagnosis present

## 2020-10-24 DIAGNOSIS — Z7984 Long term (current) use of oral hypoglycemic drugs: Secondary | ICD-10-CM

## 2020-10-24 DIAGNOSIS — F4310 Post-traumatic stress disorder, unspecified: Secondary | ICD-10-CM | POA: Diagnosis not present

## 2020-10-24 DIAGNOSIS — Z992 Dependence on renal dialysis: Secondary | ICD-10-CM | POA: Diagnosis not present

## 2020-10-24 DIAGNOSIS — E039 Hypothyroidism, unspecified: Secondary | ICD-10-CM | POA: Diagnosis present

## 2020-10-24 DIAGNOSIS — E1142 Type 2 diabetes mellitus with diabetic polyneuropathy: Secondary | ICD-10-CM | POA: Diagnosis present

## 2020-10-24 DIAGNOSIS — E1122 Type 2 diabetes mellitus with diabetic chronic kidney disease: Secondary | ICD-10-CM | POA: Diagnosis present

## 2020-10-24 DIAGNOSIS — E1129 Type 2 diabetes mellitus with other diabetic kidney complication: Secondary | ICD-10-CM | POA: Diagnosis not present

## 2020-10-24 DIAGNOSIS — I2510 Atherosclerotic heart disease of native coronary artery without angina pectoris: Secondary | ICD-10-CM | POA: Diagnosis present

## 2020-10-24 DIAGNOSIS — Z20822 Contact with and (suspected) exposure to covid-19: Secondary | ICD-10-CM | POA: Diagnosis present

## 2020-10-24 DIAGNOSIS — Z89611 Acquired absence of right leg above knee: Secondary | ICD-10-CM | POA: Diagnosis not present

## 2020-10-24 DIAGNOSIS — I6522 Occlusion and stenosis of left carotid artery: Secondary | ICD-10-CM | POA: Diagnosis not present

## 2020-10-24 DIAGNOSIS — D631 Anemia in chronic kidney disease: Secondary | ICD-10-CM | POA: Diagnosis present

## 2020-10-24 DIAGNOSIS — N2581 Secondary hyperparathyroidism of renal origin: Secondary | ICD-10-CM | POA: Diagnosis present

## 2020-10-24 DIAGNOSIS — F419 Anxiety disorder, unspecified: Secondary | ICD-10-CM | POA: Diagnosis present

## 2020-10-24 DIAGNOSIS — E782 Mixed hyperlipidemia: Secondary | ICD-10-CM | POA: Diagnosis not present

## 2020-10-24 DIAGNOSIS — I120 Hypertensive chronic kidney disease with stage 5 chronic kidney disease or end stage renal disease: Secondary | ICD-10-CM | POA: Diagnosis present

## 2020-10-24 DIAGNOSIS — J449 Chronic obstructive pulmonary disease, unspecified: Secondary | ICD-10-CM | POA: Diagnosis not present

## 2020-10-24 DIAGNOSIS — F32A Depression, unspecified: Secondary | ICD-10-CM | POA: Diagnosis present

## 2020-10-24 DIAGNOSIS — J440 Chronic obstructive pulmonary disease with acute lower respiratory infection: Secondary | ICD-10-CM | POA: Diagnosis not present

## 2020-10-24 DIAGNOSIS — K219 Gastro-esophageal reflux disease without esophagitis: Secondary | ICD-10-CM | POA: Diagnosis present

## 2020-10-24 DIAGNOSIS — Z87891 Personal history of nicotine dependence: Secondary | ICD-10-CM

## 2020-10-24 HISTORY — PX: ENDARTERECTOMY: SHX5162

## 2020-10-24 LAB — POCT I-STAT, CHEM 8
BUN: 37 mg/dL — ABNORMAL HIGH (ref 8–23)
Calcium, Ion: 1.07 mmol/L — ABNORMAL LOW (ref 1.15–1.40)
Chloride: 99 mmol/L (ref 98–111)
Creatinine, Ser: 8.3 mg/dL — ABNORMAL HIGH (ref 0.61–1.24)
Glucose, Bld: 124 mg/dL — ABNORMAL HIGH (ref 70–99)
HCT: 28 % — ABNORMAL LOW (ref 39.0–52.0)
Hemoglobin: 9.5 g/dL — ABNORMAL LOW (ref 13.0–17.0)
Potassium: 4.4 mmol/L (ref 3.5–5.1)
Sodium: 141 mmol/L (ref 135–145)
TCO2: 29 mmol/L (ref 22–32)

## 2020-10-24 LAB — POCT ACTIVATED CLOTTING TIME
Activated Clotting Time: 231 seconds
Activated Clotting Time: 267 seconds

## 2020-10-24 LAB — GLUCOSE, CAPILLARY
Glucose-Capillary: 116 mg/dL — ABNORMAL HIGH (ref 70–99)
Glucose-Capillary: 120 mg/dL — ABNORMAL HIGH (ref 70–99)
Glucose-Capillary: 78 mg/dL (ref 70–99)
Glucose-Capillary: 86 mg/dL (ref 70–99)

## 2020-10-24 SURGERY — ENDARTERECTOMY, CAROTID
Anesthesia: General | Site: Neck | Laterality: Left

## 2020-10-24 MED ORDER — POTASSIUM CHLORIDE CRYS ER 20 MEQ PO TBCR: 20.0000 meq | EXTENDED_RELEASE_TABLET | Freq: Every day | ORAL | Status: DC | PRN

## 2020-10-24 MED ORDER — MIDAZOLAM HCL 5 MG/5ML IJ SOLN
INTRAMUSCULAR | Status: DC | PRN
  Administered 2020-10-24: 2 mg via INTRAVENOUS

## 2020-10-24 MED ORDER — HEPARIN SODIUM (PORCINE) 1000 UNIT/ML DIALYSIS
1000.0000 [IU] | INTRAMUSCULAR | Status: DC | PRN
  Filled 2020-10-24: qty 1

## 2020-10-24 MED ORDER — DARBEPOETIN ALFA 60 MCG/0.3ML IJ SOSY
60.0000 ug | PREFILLED_SYRINGE | INTRAMUSCULAR | Status: DC
  Filled 2020-10-24: qty 0.3

## 2020-10-24 MED ORDER — MIDAZOLAM HCL 2 MG/2ML IJ SOLN
INTRAMUSCULAR | Status: AC
  Filled 2020-10-24: qty 2

## 2020-10-24 MED ORDER — PROPOFOL 10 MG/ML IV BOLUS
INTRAVENOUS | Status: DC | PRN
  Administered 2020-10-24: 120 mg via INTRAVENOUS

## 2020-10-24 MED ORDER — OXYCODONE HCL 5 MG PO TABS: 5.0000 mg | ORAL_TABLET | Freq: Once | ORAL | Status: AC | PRN

## 2020-10-24 MED ORDER — CHLORHEXIDINE GLUCONATE CLOTH 2 % EX PADS: 6.0000 | MEDICATED_PAD | Freq: Once | CUTANEOUS | Status: DC

## 2020-10-24 MED ORDER — ATORVASTATIN CALCIUM 80 MG PO TABS
80.0000 mg | ORAL_TABLET | Freq: Every day | ORAL | Status: DC
  Administered 2020-10-24 – 2020-10-25 (×2): 80 mg via ORAL
  Filled 2020-10-24: qty 1

## 2020-10-24 MED ORDER — CEFAZOLIN SODIUM-DEXTROSE 1-4 GM/50ML-% IV SOLN
1.0000 g | INTRAVENOUS | Status: DC
  Filled 2020-10-24: qty 50

## 2020-10-24 MED ORDER — LIDOCAINE HCL (PF) 1 % IJ SOLN: 5.0000 mL | INTRAMUSCULAR | Status: DC | PRN

## 2020-10-24 MED ORDER — LIDOCAINE-PRILOCAINE 2.5-2.5 % EX CREA
1.0000 "application " | TOPICAL_CREAM | CUTANEOUS | Status: DC | PRN
  Filled 2020-10-24: qty 5

## 2020-10-24 MED ORDER — ONDANSETRON HCL 4 MG/2ML IJ SOLN
4.0000 mg | Freq: Four times a day (QID) | INTRAMUSCULAR | Status: DC | PRN
  Administered 2020-10-25: 4 mg via INTRAVENOUS
  Filled 2020-10-24: qty 2

## 2020-10-24 MED ORDER — DULOXETINE HCL 30 MG PO CPEP
30.0000 mg | ORAL_CAPSULE | Freq: Every day | ORAL | Status: DC
Start: 2020-10-24 — End: 2020-10-25
  Administered 2020-10-24 – 2020-10-25 (×2): 30 mg via ORAL
  Filled 2020-10-24: qty 1

## 2020-10-24 MED ORDER — FENTANYL CITRATE (PF) 250 MCG/5ML IJ SOLN
INTRAMUSCULAR | Status: AC
  Filled 2020-10-24: qty 5

## 2020-10-24 MED ORDER — GUAIFENESIN-DM 100-10 MG/5ML PO SYRP: 15.0000 mL | ORAL_SOLUTION | ORAL | Status: DC | PRN

## 2020-10-24 MED ORDER — CEFAZOLIN SODIUM-DEXTROSE 2-4 GM/100ML-% IV SOLN
2.0000 g | INTRAVENOUS | Status: AC
  Administered 2020-10-24: 2 g via INTRAVENOUS

## 2020-10-24 MED ORDER — PHENYLEPHRINE HCL-NACL 10-0.9 MG/250ML-% IV SOLN
0.0000 ug/min | INTRAVENOUS | Status: DC
Start: 2020-10-24 — End: 2020-10-24

## 2020-10-24 MED ORDER — ONDANSETRON HCL 4 MG/2ML IJ SOLN
INTRAMUSCULAR | Status: DC | PRN
  Administered 2020-10-24: 4 mg via INTRAVENOUS

## 2020-10-24 MED ORDER — ACETAMINOPHEN 10 MG/ML IV SOLN: 1000.0000 mg | Freq: Once | INTRAVENOUS | Status: DC | PRN

## 2020-10-24 MED ORDER — ALTEPLASE 2 MG IJ SOLR: 2.0000 mg | Freq: Once | INTRAMUSCULAR | Status: DC | PRN

## 2020-10-24 MED ORDER — ALBUMIN HUMAN 5 % IV SOLN
INTRAVENOUS | Status: AC
  Administered 2020-10-24: 12.5 g
  Filled 2020-10-24: qty 250

## 2020-10-24 MED ORDER — CHLORHEXIDINE GLUCONATE 0.12 % MT SOLN
OROMUCOSAL | Status: AC
  Administered 2020-10-24: 15 mL via OROMUCOSAL
  Filled 2020-10-24: qty 15

## 2020-10-24 MED ORDER — TOPIRAMATE 25 MG PO TABS
50.0000 mg | ORAL_TABLET | Freq: Two times a day (BID) | ORAL | Status: DC
  Administered 2020-10-24 – 2020-10-25 (×2): 50 mg via ORAL
  Filled 2020-10-24 (×2): qty 2

## 2020-10-24 MED ORDER — LIDOCAINE 2% (20 MG/ML) 5 ML SYRINGE
INTRAMUSCULAR | Status: DC | PRN
  Administered 2020-10-24: 80 mg via INTRAVENOUS

## 2020-10-24 MED ORDER — SODIUM CHLORIDE 0.9 % IV SOLN: 100.0000 mL | INTRAVENOUS | Status: DC | PRN

## 2020-10-24 MED ORDER — OXYCODONE HCL 5 MG PO TABS
ORAL_TABLET | ORAL | Status: AC
  Administered 2020-10-24: 5 mg via ORAL
  Filled 2020-10-24: qty 1

## 2020-10-24 MED ORDER — CYCLOBENZAPRINE HCL 10 MG PO TABS: 10.0000 mg | ORAL_TABLET | Freq: Three times a day (TID) | ORAL | Status: DC | PRN

## 2020-10-24 MED ORDER — PENTAFLUOROPROP-TETRAFLUOROETH EX AERO: 1.0000 "application " | INHALATION_SPRAY | CUTANEOUS | Status: DC | PRN

## 2020-10-24 MED ORDER — CLOPIDOGREL BISULFATE 75 MG PO TABS
75.0000 mg | ORAL_TABLET | Freq: Every day | ORAL | Status: DC
  Administered 2020-10-24 – 2020-10-25 (×2): 75 mg via ORAL
  Filled 2020-10-24 (×2): qty 1

## 2020-10-24 MED ORDER — PANTOPRAZOLE SODIUM 40 MG PO TBEC
40.0000 mg | DELAYED_RELEASE_TABLET | Freq: Every day | ORAL | Status: DC
  Administered 2020-10-24 – 2020-10-25 (×2): 40 mg via ORAL
  Filled 2020-10-24 (×2): qty 1

## 2020-10-24 MED ORDER — HYDROMORPHONE HCL 1 MG/ML IJ SOLN
0.5000 mg | INTRAMUSCULAR | Status: DC | PRN
  Administered 2020-10-24 – 2020-10-25 (×5): 1 mg via INTRAVENOUS
  Filled 2020-10-24 (×5): qty 1

## 2020-10-24 MED ORDER — ORAL CARE MOUTH RINSE: 15.0000 mL | Freq: Once | OROMUCOSAL | Status: AC

## 2020-10-24 MED ORDER — LIDOCAINE HCL (PF) 1 % IJ SOLN
INTRAMUSCULAR | Status: AC
  Filled 2020-10-24: qty 5

## 2020-10-24 MED ORDER — SODIUM CHLORIDE 0.9 % IV SOLN: INTRAVENOUS | Status: DC

## 2020-10-24 MED ORDER — ALBUTEROL SULFATE HFA 108 (90 BASE) MCG/ACT IN AERS
2.0000 | INHALATION_SPRAY | Freq: Four times a day (QID) | RESPIRATORY_TRACT | Status: DC | PRN
  Filled 2020-10-24: qty 6.7

## 2020-10-24 MED ORDER — FENTANYL CITRATE (PF) 100 MCG/2ML IJ SOLN
INTRAMUSCULAR | Status: DC | PRN
  Administered 2020-10-24: 50 ug via INTRAVENOUS
  Administered 2020-10-24: 100 ug via INTRAVENOUS
  Administered 2020-10-24 (×2): 50 ug via INTRAVENOUS

## 2020-10-24 MED ORDER — CINACALCET HCL 30 MG PO TABS
30.0000 mg | ORAL_TABLET | Freq: Every evening | ORAL | Status: DC
  Administered 2020-10-24: 30 mg via ORAL
  Filled 2020-10-24: qty 1

## 2020-10-24 MED ORDER — LISINOPRIL 10 MG PO TABS
10.0000 mg | ORAL_TABLET | Freq: Every day | ORAL | Status: DC
  Administered 2020-10-25: 10 mg via ORAL
  Filled 2020-10-24: qty 1

## 2020-10-24 MED ORDER — SODIUM CHLORIDE 0.9 % IV SOLN: 500.0000 mL | Freq: Once | INTRAVENOUS | Status: DC | PRN

## 2020-10-24 MED ORDER — PHENOL 1.4 % MT LIQD
1.0000 | OROMUCOSAL | Status: DC | PRN
  Administered 2020-10-24: 1 via OROMUCOSAL
  Filled 2020-10-24: qty 177

## 2020-10-24 MED ORDER — HYDROMORPHONE HCL 1 MG/ML IJ SOLN
0.2500 mg | INTRAMUSCULAR | Status: DC | PRN
  Administered 2020-10-24: 0.25 mg via INTRAVENOUS

## 2020-10-24 MED ORDER — PHENYLEPHRINE HCL-NACL 10-0.9 MG/250ML-% IV SOLN
INTRAVENOUS | Status: DC | PRN
  Administered 2020-10-24: 20 ug/min via INTRAVENOUS

## 2020-10-24 MED ORDER — HYDRALAZINE HCL 20 MG/ML IJ SOLN: 5.0000 mg | INTRAMUSCULAR | Status: DC | PRN

## 2020-10-24 MED ORDER — METOPROLOL TARTRATE 5 MG/5ML IV SOLN
2.0000 mg | INTRAVENOUS | Status: DC | PRN
Start: 2020-10-24 — End: 2020-10-25

## 2020-10-24 MED ORDER — ROCURONIUM BROMIDE 10 MG/ML (PF) SYRINGE
PREFILLED_SYRINGE | INTRAVENOUS | Status: DC | PRN
  Administered 2020-10-24: 30 mg via INTRAVENOUS
  Administered 2020-10-24: 70 mg via INTRAVENOUS

## 2020-10-24 MED ORDER — OXYCODONE HCL 5 MG/5ML PO SOLN
5.0000 mg | Freq: Once | ORAL | Status: AC | PRN
Start: 2020-10-24 — End: 2020-10-24

## 2020-10-24 MED ORDER — HYDROMORPHONE HCL 1 MG/ML IJ SOLN
INTRAMUSCULAR | Status: AC
  Administered 2020-10-24: 0.5 mg via INTRAVENOUS
  Filled 2020-10-24: qty 2

## 2020-10-24 MED ORDER — 0.9 % SODIUM CHLORIDE (POUR BTL) OPTIME
TOPICAL | Status: DC | PRN
  Administered 2020-10-24: 2000 mL

## 2020-10-24 MED ORDER — PREGABALIN 75 MG PO CAPS
150.0000 mg | ORAL_CAPSULE | Freq: Three times a day (TID) | ORAL | Status: DC
  Administered 2020-10-24 – 2020-10-25 (×3): 150 mg via ORAL
  Filled 2020-10-24 (×2): qty 2

## 2020-10-24 MED ORDER — CEFAZOLIN SODIUM-DEXTROSE 2-4 GM/100ML-% IV SOLN
INTRAVENOUS | Status: AC
  Filled 2020-10-24: qty 100

## 2020-10-24 MED ORDER — DOCUSATE SODIUM 100 MG PO CAPS
100.0000 mg | ORAL_CAPSULE | Freq: Every day | ORAL | Status: DC
  Administered 2020-10-25: 100 mg via ORAL
  Filled 2020-10-24: qty 1

## 2020-10-24 MED ORDER — OXYCODONE-ACETAMINOPHEN 5-325 MG PO TABS
1.0000 | ORAL_TABLET | ORAL | Status: DC | PRN
Start: 2020-10-24 — End: 2020-10-24
  Administered 2020-10-24: 2 via ORAL
  Filled 2020-10-24: qty 2

## 2020-10-24 MED ORDER — SUGAMMADEX SODIUM 200 MG/2ML IV SOLN
INTRAVENOUS | Status: DC | PRN
  Administered 2020-10-24: 400 mg via INTRAVENOUS

## 2020-10-24 MED ORDER — OXYCODONE HCL 5 MG PO TABS
5.0000 mg | ORAL_TABLET | ORAL | Status: DC | PRN
  Administered 2020-10-24 – 2020-10-25 (×3): 5 mg via ORAL
  Filled 2020-10-24 (×3): qty 1

## 2020-10-24 MED ORDER — MAGNESIUM SULFATE 2 GM/50ML IV SOLN: 2.0000 g | Freq: Every day | INTRAVENOUS | Status: DC | PRN

## 2020-10-24 MED ORDER — ALUM & MAG HYDROXIDE-SIMETH 200-200-20 MG/5ML PO SUSP: 15.0000 mL | ORAL | Status: DC | PRN

## 2020-10-24 MED ORDER — EPHEDRINE SULFATE-NACL 50-0.9 MG/10ML-% IV SOSY
PREFILLED_SYRINGE | INTRAVENOUS | Status: DC | PRN
  Administered 2020-10-24: 10 mg via INTRAVENOUS
  Administered 2020-10-24 (×3): 5 mg via INTRAVENOUS

## 2020-10-24 MED ORDER — HEPARIN SODIUM (PORCINE) 1000 UNIT/ML IJ SOLN
INTRAMUSCULAR | Status: DC | PRN
  Administered 2020-10-24: 10000 [IU] via INTRAVENOUS

## 2020-10-24 MED ORDER — MELATONIN 5 MG PO TABS
10.0000 mg | ORAL_TABLET | Freq: Every day | ORAL | Status: DC
  Administered 2020-10-24: 10 mg via ORAL
  Filled 2020-10-24: qty 2

## 2020-10-24 MED ORDER — SODIUM CHLORIDE 0.9 % IV SOLN
INTRAVENOUS | Status: DC | PRN
  Administered 2020-10-24: 500 mL

## 2020-10-24 MED ORDER — MIDODRINE HCL 5 MG PO TABS: 10.0000 mg | ORAL_TABLET | ORAL | Status: DC

## 2020-10-24 MED ORDER — CHLORHEXIDINE GLUCONATE 0.12 % MT SOLN: 15.0000 mL | Freq: Once | OROMUCOSAL | Status: AC

## 2020-10-24 MED ORDER — HEMOSTATIC AGENTS (NO CHARGE) OPTIME
TOPICAL | Status: DC | PRN
  Administered 2020-10-24: 1 via TOPICAL

## 2020-10-24 MED ORDER — ACETAMINOPHEN 325 MG PO TABS: 325.0000 mg | ORAL_TABLET | ORAL | Status: DC | PRN

## 2020-10-24 MED ORDER — LABETALOL HCL 5 MG/ML IV SOLN: 10.0000 mg | INTRAVENOUS | Status: DC | PRN

## 2020-10-24 MED ORDER — ACETAMINOPHEN 650 MG RE SUPP: 325.0000 mg | RECTAL | Status: DC | PRN

## 2020-10-24 MED ORDER — PROTAMINE SULFATE 10 MG/ML IV SOLN
INTRAVENOUS | Status: DC | PRN
  Administered 2020-10-24: 100 mg via INTRAVENOUS

## 2020-10-24 MED ORDER — ALPRAZOLAM 0.5 MG PO TABS
0.5000 mg | ORAL_TABLET | Freq: Two times a day (BID) | ORAL | Status: DC | PRN
  Administered 2020-10-24: 0.5 mg via ORAL
  Filled 2020-10-24: qty 1

## 2020-10-24 MED ORDER — POLYETHYLENE GLYCOL 3350 17 G PO PACK
17.0000 g | PACK | Freq: Every day | ORAL | Status: DC
  Filled 2020-10-24: qty 1

## 2020-10-24 MED ORDER — GLYCOPYRROLATE PF 0.2 MG/ML IJ SOSY
PREFILLED_SYRINGE | INTRAMUSCULAR | Status: DC | PRN
  Administered 2020-10-24: .2 mg via INTRAVENOUS

## 2020-10-24 MED ORDER — LEVOTHYROXINE SODIUM 25 MCG PO TABS
125.0000 ug | ORAL_TABLET | Freq: Every day | ORAL | Status: DC
  Administered 2020-10-25: 125 ug via ORAL
  Filled 2020-10-24: qty 1

## 2020-10-24 SURGICAL SUPPLY — 41 items
BLADE CLIPPER SURG (BLADE) ×2 IMPLANT
CANISTER SUCT 3000ML PPV (MISCELLANEOUS) ×2 IMPLANT
CANNULA VESSEL 3MM 2 BLNT TIP (CANNULA) ×4 IMPLANT
CATH ROBINSON RED A/P 18FR (CATHETERS) ×2 IMPLANT
CLIP VESOCCLUDE MED 6/CT (CLIP) ×2 IMPLANT
CLIP VESOCCLUDE SM WIDE 6/CT (CLIP) ×2 IMPLANT
COVER WAND RF STERILE (DRAPES) IMPLANT
DECANTER SPIKE VIAL GLASS SM (MISCELLANEOUS) IMPLANT
DERMABOND ADVANCED (GAUZE/BANDAGES/DRESSINGS) ×1
DERMABOND ADVANCED .7 DNX12 (GAUZE/BANDAGES/DRESSINGS) ×1 IMPLANT
DRAIN HEMOVAC 1/8 X 5 (WOUND CARE) IMPLANT
ELECT REM PT RETURN 9FT ADLT (ELECTROSURGICAL) ×2
ELECTRODE REM PT RTRN 9FT ADLT (ELECTROSURGICAL) ×1 IMPLANT
EVACUATOR SILICONE 100CC (DRAIN) IMPLANT
GLOVE BIO SURGEON STRL SZ7.5 (GLOVE) ×2 IMPLANT
GLOVE SURG PR MICRO ENCORE 7.5 (GLOVE) ×4 IMPLANT
GOWN STRL REUS W/ TWL LRG LVL3 (GOWN DISPOSABLE) ×3 IMPLANT
GOWN STRL REUS W/TWL LRG LVL3 (GOWN DISPOSABLE) ×6
HEMOSTAT ARISTA ABSORB 3G PWDR (HEMOSTASIS) ×2 IMPLANT
HEMOSTAT SPONGE AVITENE ULTRA (HEMOSTASIS) IMPLANT
KIT BASIN OR (CUSTOM PROCEDURE TRAY) ×2 IMPLANT
KIT SHUNT ARGYLE CAROTID ART 6 (VASCULAR PRODUCTS) ×2 IMPLANT
KIT TURNOVER KIT B (KITS) ×2 IMPLANT
NEEDLE HYPO 25GX1X1/2 BEV (NEEDLE) IMPLANT
NS IRRIG 1000ML POUR BTL (IV SOLUTION) ×4 IMPLANT
PACK CAROTID (CUSTOM PROCEDURE TRAY) ×2 IMPLANT
PAD ARMBOARD 7.5X6 YLW CONV (MISCELLANEOUS) ×4 IMPLANT
PATCH HEMASHIELD 8X75 (Vascular Products) ×2 IMPLANT
POSITIONER HEAD DONUT 9IN (MISCELLANEOUS) ×2 IMPLANT
SHUNT CAROTID BYPASS 10 (VASCULAR PRODUCTS) IMPLANT
SHUNT CAROTID BYPASS 12FRX15.5 (VASCULAR PRODUCTS) IMPLANT
SUT ETHILON 3 0 PS 1 (SUTURE) IMPLANT
SUT PROLENE 6 0 CC (SUTURE) ×2 IMPLANT
SUT SILK 3 0 TIES 17X18 (SUTURE)
SUT SILK 3-0 18XBRD TIE BLK (SUTURE) IMPLANT
SUT VIC AB 3-0 SH 27 (SUTURE) ×2
SUT VIC AB 3-0 SH 27X BRD (SUTURE) ×1 IMPLANT
SUT VICRYL 4-0 PS2 18IN ABS (SUTURE) ×2 IMPLANT
SYR CONTROL 10ML LL (SYRINGE) IMPLANT
TOWEL GREEN STERILE (TOWEL DISPOSABLE) ×2 IMPLANT
WATER STERILE IRR 1000ML POUR (IV SOLUTION) ×2 IMPLANT

## 2020-10-24 NOTE — Consult Note (Signed)
Reason for Consult: Continuity of ESRD care Referring Physician: [name] MD (vascular surgery)  HPI:  70 year old Caucasian man with past medical history significant for hypertension, type 2 diabetes mellitus, peripheral vascular disease status post right above-knee amputation with healing wounds over left leg and end-stage renal disease on hemodialysis on a TTS schedule at the [facility] unit in [place].  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  6 months ago, he had a left femoral endarterectomy with left femoropopliteal bypass for a nonhealing wound in the left foot.  He denies any chest pain or shortness of breath and reports that his estimated dry weight is 108 kg.  He has not had any problems recently on dialysis.  Past Medical History:  Diagnosis Date  . Anemia   . Anxiety   . Arthritis   . CAD (coronary artery disease)    Myoview August 2021 with evidence of large inferior scar but no active ischemia  . COPD (chronic obstructive pulmonary disease) ([place])   . Depression   . ESRD on hemodialysis ([place])   . Essential hypertension   . GERD (gastroesophageal reflux disease)   . H/O hiatal hernia   . Headache(784.0)   . History of kidney stones   . History of pneumonia   . Hypothyroidism   . Neuropathy   . Non-healing wound of amputation stump (HCC)    Right  . Peripheral vascular disease ([place])   . PTSD (post-traumatic stress disorder)   . Type 2 diabetes mellitus ([place])    Type II    Past Surgical History:  Procedure Laterality Date  . A/V FISTULAGRAM Left 01/16/2019   Procedure: A/V FISTULAGRAM;  Surgeon: [name], MD;  Location: [place] [facility];  Service: Cardiovascular;  Laterality: Left;  . ABDOMINAL AORTOGRAM W/LOWER EXTREMITY Bilateral 03/18/2020   Procedure: ABDOMINAL AORTOGRAM W/LOWER EXTREMITY;  Surgeon: [name], MD;  Location: [place] [facility];  Service: Cardiovascular;   Laterality: Bilateral;  . AMPUTATION Right 12/01/2018   Procedure: RIGHT AMPUTATION BELOW KNEE;  Surgeon: [name], MD;  Location: [facility] OR;  Service: Vascular;  Laterality: Right;  . AMPUTATION Right 04/27/2019   Procedure: AMPUTATION BELOW KNEE REVISION;  Surgeon: [name], MD;  Location: [place];  Service: Vascular;  Laterality: Right;  . AMPUTATION Right 04/30/2019   Procedure: AMPUTATION ABOVE KNEE - RIGHT;  Surgeon: Waynetta Sandy, MD;  Location: [place];  Service: Vascular;  Laterality: Right;  . APPLICATION OF WOUND VAC Right 04/27/2019   Procedure: APPLICATION OF WOUND VAC;  Surgeon: [name], MD;  Location: [place];  Service: Vascular;  Laterality: Right;  . AV FISTULA PLACEMENT  2012      left arm   . AV FISTULA PLACEMENT Left 12/18/2012   Procedure: ARTERIOVENOUS (AV) FISTULA CREATION;  Surgeon: Angelia Mould, MD;  Location: [place];  Service: Vascular;  Laterality: Left;  . COLONOSCOPY  10/26/2011   Procedure: COLONOSCOPY;  Surgeon: Rogene Houston, MD;  [facility];  Service: Endoscopy;  Laterality: N/A;  730  . EMBOLECTOMY Left 12/09/2012   Procedure: EMBOLECTOMY;  Surgeon: Serafina Mitchell, MD;  Location: [facility] C[facility] LAB;  Service: Cardiovascular;  Laterality: Left;  left arm venous embolization  . ENDARTERECTOMY FEMORAL Left 04/04/2020   Procedure: ENDARTERECTOMY COMMON FEMORAL;  Surgeon: [name], MD;  Location: [facility] OR;  Service: Vascular;  Laterality: Left;  . ESOPHAGOGASTRODUODENOSCOPY (EGD)  WITH ESOPHAGEAL DILATION N/A 04/23/2013   Procedure: ESOPHAGOGASTRODUODENOSCOPY (EGD) WITH ESOPHAGEAL DILATION;  Surgeon: Rogene Houston, MD;  [facility];  Service: Endoscopy;  Laterality: N/A;  200-moved to 930   . FEMORAL-POPLITEAL BYPASS GRAFT Left 04/04/2020   Procedure: FEMORAL- BELOW KNEE POPLITEAL BYPASS GRAFT NON REVERSED VEIN;  Surgeon: [name], MD;  Location: [place];  Service: Vascular;  Laterality: Left;  . FISTULA  SUPERFICIALIZATION Left 06/18/2013   Procedure: FISTULA SUPERFICIALIZATION & LIGATION BRANCH X 1;  Surgeon: Mal Misty, MD;  Location: [place];  Service: Vascular;  Laterality: Left;  . GROIN DEBRIDEMENT Left 04/29/2020   Procedure: EXPLORATION LEFT GROIN WITH DEBRIDEMENT;  Surgeon: Rosetta Posner, MD;  Location: [place];  Service: Vascular;  Laterality: Left;  . HEMATOMA EVACUATION Right 02/11/2017   Procedure: EVACUATION HEMATOMA RIGHT GROIN, Repair of Right Pseudo-anerysm.;  Surgeon: [name], MD;  Location: [facility];  Service: Vascular;  Laterality: Right;  . INGUINAL HERNIA REPAIR     ,  times   2  . INSERTION OF DIALYSIS CATHETER Left 12/18/2012   Procedure: INSERTION OF DIALYSIS CATHETER;  Surgeon: Angelia Mould, MD;  Location: [place];  Service: Vascular;  Laterality: Left;  . KNEE ARTHROSCOPY  2011   Right Knee  . LOWER EXTREMITY ANGIOGRAPHY N/A 02/11/2017   Procedure: Lower Extremity Angiography;  Surgeon: Lorretta Harp, MD;  Location: [place] [facility];  Service: Cardiovascular;  Laterality: N/A;  . PERIPHERAL ATHRECTOMY  02/11/2017  . PERIPHERAL VASCULAR ATHERECTOMY Left 02/11/2017   Procedure: Peripheral Vascular Atherectomy;  Surgeon: Lorretta Harp, MD;  Location: [place] [facility];  Service: Cardiovascular;  Laterality: Left;  . PERIPHERAL VASCULAR BALLOON ANGIOPLASTY Left 01/16/2019   Procedure: PERIPHERAL VASCULAR BALLOON ANGIOPLASTY;  Surgeon: [name], MD;  Location: [place] [facility];  Service: Cardiovascular;  Laterality: Left;  central vein  . REVISON OF ARTERIOVENOUS FISTULA Left 1/60/1093   Procedure: PLICATION OF LEFT BRACHIOCEPHALIC ARTERIOVENOUS FISTULA;  Surgeon: Conrad [place]ville, MD;  Location: [place];  Service: Vascular;  Laterality: Left;  . SHUNTOGRAM N/A 12/09/2012   Procedure: fistulogram;  Surgeon: Serafina Mitchell, MD;  Location: [facility] CATH LAB;  Service: Cardiovascular;  Laterality: N/A;  . SHUNTOGRAM Left 06/03/2013   Procedure: Fistulogram;   Surgeon: Serafina Mitchell, MD;  Location: [facility] CATH LAB;  Service: Cardiovascular;  Laterality: Left;  . THROMBECTOMY W/ EMBOLECTOMY Left 12/11/2012   Procedure: THROMBECTOMY ARTERIOVENOUS FISTULA;  Surgeon: Serafina Mitchell, MD;  Location: [place];  Service: Vascular;  Laterality: Left;  . TONSILLECTOMY    . WOUND DEBRIDEMENT Right 02/11/2019   Procedure: DEBRIDEMENT WOUND RIGHT BELOW THE KNEE STUMP;  Surgeon: Serafina Mitchell, MD;  Location: [facility] OR;  Service: Vascular;  Laterality: Right;    Family History  Problem Relation Age of Onset  . Heart disease Father        Heart Disease before age 65  . Healthy Daughter   . Healthy Daughter     Social History:  reports that he quit smoking about 3 months ago. His smoking use included cigarettes. He has a 9.00 pack-year smoking history. He has never used smokeless tobacco. He reports current drug use. Drug: Marijuana. He reports that he does not drink alcohol.  Allergies:  Allergies  Allergen Reactions  . Ace Inhibitors     Unknown reaction, tolerates lisinopril   . Clonidine     Unknown reaction   . Morphine And Related Other (  See Comments)    Not effective  . Oxytocin     unknown    Medications: Scheduled:   BMP Latest Ref Rng & Units 10/24/2020 10/20/2020 09/26/2020  Glucose 70 - 99 mg/dL 124(H) 105(H) -  BUN 8 - 23 mg/dL 37(H) 17 -  Creatinine 0.61 - 1.24 mg/dL 8.30(H) 4.47(H) 8.80(H)  BUN/Creat Ratio 10 - 24 - - -  Sodium 135 - 145 mmol/L 141 140 -  Potassium 3.5 - 5.1 mmol/L 4.4 4.0 -  Chloride 98 - 111 mmol/L 99 97(L) -  CO2 22 - 32 mmol/L - 32 -  Calcium 8.9 - 10.3 mg/dL - 9.1 -   CBC Latest Ref Rng & Units 10/24/2020 10/20/2020 04/29/2020  WBC 4.0 - 10.5 K/uL - 4.2 -  Hemoglobin 13.0 - 17.0 g/dL 9.5(L) 9.5(L) 8.8(L)  Hematocrit 39.0 - 52.0 % 28.0(L) 29.7(L) 26.0(L)  Platelets 150 - 400 K/uL - 137(L) -     No results found.  Review of Systems  Constitutional: Positive for fatigue. Negative for chills and fever.  HENT:  Negative for nosebleeds, sore throat and trouble swallowing.   Eyes: Negative for pain, redness and visual disturbance.  Respiratory: Negative for chest tightness, shortness of breath and wheezing.   Cardiovascular: Negative for chest pain and leg swelling.  Gastrointestinal: Negative for blood in stool, diarrhea, nausea and vomiting.  Genitourinary: Negative for dysuria, hematuria and urgency.  Musculoskeletal: Positive for arthralgias and back pain. Negative for myalgias.  Skin: Positive for wound.       Left leg  Neurological: Positive for dizziness and light-headedness.   Blood pressure (!) 91/52, pulse (!) 55, temperature (!) 97.1 F (36.2 C), resp. rate 12, height 6\' 4"  (1.93 m), weight 108.9 kg, SpO2 96 %. Physical Exam Vitals reviewed.  Constitutional:      General: He is not in acute distress.    Appearance: Normal appearance. He is not ill-appearing.     Comments: Somnolent-in PACU  HENT:     Head: Normocephalic and atraumatic.     Right Ear: External ear normal.     Left Ear: External ear normal.     Nose: Nose normal.     Mouth/Throat:     Mouth: Mucous membranes are dry.     Pharynx: Oropharynx is clear.  Eyes:     Extraocular Movements: Extraocular movements intact.     Conjunctiva/sclera: Conjunctivae normal.  Neck:     Comments: Left neck surgical scar from CEA Cardiovascular:     Rate and Rhythm: Normal rate and regular rhythm.     Pulses: Normal pulses.     Heart sounds: Normal heart sounds. No murmur heard.   Pulmonary:     Effort: Pulmonary effort is normal.     Breath sounds: Normal breath sounds. No wheezing or rales.  Abdominal:     General: Abdomen is flat.     Palpations: Abdomen is soft.     Tenderness: There is no abdominal tenderness.  Musculoskeletal:     Left lower leg: Edema present.     Comments: Status post right AKA, 1-2+ edema left leg.  Left upper arm AV fistula  Skin:    General: Skin is warm and dry.     Coloration: Skin is pale.      Findings: Lesion present.     Comments: Left pretibial skin ulcer     Assessment/Plan: 1.  Left internal carotid stenosis: Status post carotid endarterectomy performed earlier today and he will be admitted to the  hospital for observation overnight by vascular surgery. 2.  End-stage renal disease: Usually on a TTS dialysis schedule and I will order for hemodialysis tomorrow.  He is minimally over his estimated dry weight and does not have any acute electrolyte abnormality or compelling indications for dialysis today. 3.  Hypertension: Blood pressures currently low in the PACU, will monitor overnight and determine ability for ultrafiltration with dialysis again tomorrow. 4.  Anemia of chronic kidney disease: Likely compounded by recent surgery, will dose ESA with dialysis tomorrow. 5.  Secondary hyperparathyroidism: We will follow calcium and phosphorus levels and resume phosphorus binder.  Jazmin Vensel K. 10/24/2020, 1:10 PM

## 2020-10-24 NOTE — Transfer of Care (Signed)
Immediate Anesthesia Transfer of Care Note  Patient: [name]  Procedure(s) Performed: LEFT CAROTID ENDARTERECTOMY (Left Neck)  Patient Location: PACU  Anesthesia Type:General  Level of Consciousness: awake  Airway & Oxygen Therapy: Patient Spontanous Breathing and Patient connected to face mask oxygen  Post-op Assessment: Report given to RN and Post -op Vital signs reviewed and stable  Post vital signs: Reviewed and stable  Last Vitals:  Vitals Value Taken Time  BP 102/32 10/24/20 1223  Temp    Pulse 62 10/24/20 1227  Resp 16 10/24/20 1227  SpO2 100 % 10/24/20 1227  Vitals shown include unvalidated device data.  Last Pain:  Vitals:   10/24/20 0926  TempSrc:   PainSc: 5       Patients Stated Pain Goal: 2 (59/10/28 9022)  Complications: No complications documented.

## 2020-10-24 NOTE — Progress Notes (Signed)
Pt transferred from PACU, tele initiated, VSS, a line set up. Oriented to bed and unit, call bell within reach bed in lowest locked position.

## 2020-10-24 NOTE — Anesthesia Procedure Notes (Signed)
Arterial Line Insertion Start/End3/14/2022 10:00 AM, 10/24/2020 10:05 AM Performed by: Myrtie Soman, MD, Mariea Clonts, CRNA, CRNA  Patient location: OR. Preanesthetic checklist: patient identified, IV checked, site marked, risks and benefits discussed, surgical consent, monitors and equipment checked, pre-op evaluation, timeout performed and anesthesia consent Patient sedated Right, radial was placed Catheter size: 20 G Hand hygiene performed  and maximum sterile barriers used  Allen's test indicative of satisfactory collateral circulation Attempts: 2 Procedure performed without using ultrasound guided technique. Patient tolerated the procedure well with no immediate complications.

## 2020-10-24 NOTE — Anesthesia Postprocedure Evaluation (Signed)
Anesthesia Post Note  Patient: [name]  Procedure(s) Performed: LEFT CAROTID ENDARTERECTOMY (Left Neck)     Patient location during evaluation: PACU Anesthesia Type: General Level of consciousness: awake and alert Pain management: pain level controlled Vital Signs Assessment: post-procedure vital signs reviewed and stable Respiratory status: spontaneous breathing, nonlabored ventilation, respiratory function stable and patient connected to nasal cannula oxygen Cardiovascular status: blood pressure returned to baseline and stable Postop Assessment: no apparent nausea or vomiting Anesthetic complications: no   No complications documented.  Last Vitals:  Vitals:   10/24/20 1255 10/24/20 1310  BP: (!) 96/49 (!) 92/50  Pulse: (!) 51 (!) 54  Resp: (!) 7 11  Temp:    SpO2: 100% 100%    Last Pain:  Vitals:   10/24/20 1310  TempSrc:   PainSc: 4                  Angelette Ganus S

## 2020-10-24 NOTE — Progress Notes (Signed)
Patient seen in recovery.  He is moving all of his extremities normally.  He has no facial asymmetry.  His tongue is midline.  He has no neck hematoma.  Stable postop.  [name], MD [facility] Office: [phone]

## 2020-10-24 NOTE — Anesthesia Procedure Notes (Signed)
Procedure Name: Intubation Date/Time: 10/24/2020 9:58 AM Performed by: Hoy Morn, CRNA Pre-anesthesia Checklist: Patient identified, Emergency Drugs available, Suction available and Patient being monitored Patient Re-evaluated:Patient Re-evaluated prior to induction Oxygen Delivery Method: Circle system utilized Preoxygenation: Pre-oxygenation with 100% oxygen Induction Type: IV induction Ventilation: Mask ventilation without difficulty Laryngoscope Size: Miller and 2 Grade View: Grade I Tube type: Oral Tube size: 7.5 mm Number of attempts: 1 Airway Equipment and Method: Stylet and Oral airway Placement Confirmation: ETT inserted through vocal cords under direct vision,  positive ETCO2 and breath sounds checked- equal and bilateral Secured at: 24 cm Tube secured with: Tape Dental Injury: Teeth and Oropharynx as per pre-operative assessment

## 2020-10-24 NOTE — Progress Notes (Signed)
PHARMACY NOTE:  ANTIMICROBIAL RENAL DOSAGE ADJUSTMENT  Current antimicrobial regimen includes a mismatch between antimicrobial dosage and estimated renal function.  As per policy approved by the Pharmacy & Therapeutics and Medical Executive Committees, the antimicrobial dosage will be adjusted accordingly.  Current antimicrobial dosage:  Cefazolin 2 gm IV Q 8 hrs X 2 doses  Indication:  Post-op surgical prophylaxis  Renal Function:  Estimated Creatinine Clearance: 11.2 mL/min (A) (by C-G formula based on SCr of 8.3 mg/dL (H)). [x]      On intermittent HD, scheduled: TTS []      On CRRT    Antimicrobial dosage has been changed to:  Cefazolin 1 gm IV Q 24 hr X 1 dose (HD pt; rec'd cefazolin 2 gm pre op; next HD scheduled for tomorrow)  Thank you for allowing pharmacy to be a part of this patient's care.  Gillermina Hu, PharmD, BCPS, [facility] Clinical Pharmacist 10/24/2020 3:39 PM

## 2020-10-24 NOTE — H&P (Signed)
Patient is a 70 year old male who returns for follow-up today. At his last office visit he was found to have a greater than 80% left internal carotid artery stenosis with no significant right-sided stenosis.  He returns today for further discussion regarding this.  He has had no symptoms of TIA amaurosis or stroke.  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  He was last seen December 2021.  We have been following him for carotid and peripheral arterial disease.  His most recent procedure he underwent left femoral endarterectomy and left femoral to below-knee popliteal bypass with vein August 2021.  This was done for nonhealing wound in the left foot.  He has had a prior right above-knee amputation.  He is on Plavix and a statin.       Past Medical History:  Diagnosis Date  . Anemia   . Anxiety   . Arthritis   . CAD (coronary artery disease)    Myoview August 2021 with evidence of large inferior scar but no active ischemia  . COPD (chronic obstructive pulmonary disease) ([place])   . Depression   . ESRD on hemodialysis ([place])   . Essential hypertension   . GERD (gastroesophageal reflux disease)   . H/O hiatal hernia   . Headache(784.0)   . History of kidney stones   . History of pneumonia   . Hypothyroidism   . Neuropathy   . Non-healing wound of amputation stump (HCC)    Right  . Peripheral vascular disease ([place])   . PTSD (post-traumatic stress disorder)   . Type 2 diabetes mellitus ([place])          Past Surgical History:  Procedure Laterality Date  . A/V FISTULAGRAM Left 01/16/2019   Procedure: A/V FISTULAGRAM;  Surgeon: [name], MD;  Location: [place] [facility] [facility];  Service: Cardiovascular;  Laterality: Left;  . ABDOMINAL AORTOGRAM W/LOWER EXTREMITY Bilateral 03/18/2020   Procedure: ABDOMINAL AORTOGRAM W/LOWER EXTREMITY;  Surgeon: [name], MD;  Location: [place] [facility];   Service: Cardiovascular;  Laterality: Bilateral;  . AMPUTATION Right 12/01/2018   Procedure: RIGHT AMPUTATION BELOW KNEE;  Surgeon: [name], MD;  Location: [facility] OR;  Service: Vascular;  Laterality: Right;  . AMPUTATION Right 04/27/2019   Procedure: AMPUTATION BELOW KNEE REVISION;  Surgeon: [name], MD;  Location: [place];  Service: Vascular;  Laterality: Right;  . AMPUTATION Right 04/30/2019   Procedure: AMPUTATION ABOVE KNEE - RIGHT;  Surgeon: Waynetta Sandy, MD;  Location: [place];  Service: Vascular;  Laterality: Right;  . APPLICATION OF WOUND VAC Right 04/27/2019   Procedure: APPLICATION OF WOUND VAC;  Surgeon: [name], MD;  Location: [place];  Service: Vascular;  Laterality: Right;  . AV FISTULA PLACEMENT  2012      left arm   . AV FISTULA PLACEMENT Left 12/18/2012   Procedure: ARTERIOVENOUS (AV) FISTULA CREATION;  Surgeon: Angelia Mould, MD;  Location: [place];  Service: Vascular;  Laterality: Left;  . COLONOSCOPY  10/26/2011   Procedure: COLONOSCOPY;  Surgeon: Rogene Houston, MD;  [facility];  Service: Endoscopy;  Laterality: N/A;  730  . EMBOLECTOMY Left 12/09/2012   Procedure: EMBOLECTOMY;  Surgeon: Serafina Mitchell, MD;  Location: [facility] CATH LAB;  Service: Cardiovascular;  Laterality: Left;  left arm venous embolization  . ENDARTERECTOMY FEMORAL Left 04/04/2020   Procedure: ENDARTERECTOMY COMMON FEMORAL;  Surgeon: [name],  MD;  [facility]: [facility];  Service: Vascular;  Laterality: Left;  . ESOPHAGOGASTRODUODENOSCOPY (EGD) WITH ESOPHAGEAL DILATION N/A 04/23/2013   Procedure: ESOPHAGOGASTRODUODENOSCOPY (EGD) WITH ESOPHAGEAL DILATION;  Surgeon: Rogene Houston, MD;  [facility];  Service: Endoscopy;  Laterality: N/A;  200-moved to 930   . FEMORAL-POPLITEAL BYPASS GRAFT Left 04/04/2020   Procedure: FEMORAL- BELOW KNEE POPLITEAL BYPASS GRAFT NON REVERSED VEIN;  Surgeon: [name], MD;  Location: [place];  Service:  Vascular;  Laterality: Left;  . FISTULA SUPERFICIALIZATION Left 06/18/2013   Procedure: FISTULA SUPERFICIALIZATION & LIGATION BRANCH X 1;  Surgeon: Mal Misty, MD;  Location: [place];  Service: Vascular;  Laterality: Left;  . GROIN DEBRIDEMENT Left 04/29/2020   Procedure: EXPLORATION LEFT GROIN WITH DEBRIDEMENT;  Surgeon: Rosetta Posner, MD;  Location: [place];  Service: Vascular;  Laterality: Left;  . HEMATOMA EVACUATION Right 02/11/2017   Procedure: EVACUATION HEMATOMA RIGHT GROIN, Repair of Right Pseudo-anerysm.;  Surgeon: [name], MD;  [facility];  Service: Vascular;  Laterality: Right;  . INGUINAL HERNIA REPAIR     ,  times   2  . INSERTION OF DIALYSIS CATHETER Left 12/18/2012   Procedure: INSERTION OF DIALYSIS CATHETER;  Surgeon: Angelia Mould, MD;  Location: [place];  Service: Vascular;  Laterality: Left;  . KNEE ARTHROSCOPY  2011   Right Knee  . LOWER EXTREMITY ANGIOGRAPHY N/A 02/11/2017   Procedure: Lower Extremity Angiography;  Surgeon: Lorretta Harp, MD;  Location: [employer];  Service: Cardiovascular;  Laterality: N/A;  . PERIPHERAL ATHRECTOMY  02/11/2017  . PERIPHERAL VASCULAR ATHERECTOMY Left 02/11/2017   Procedure: Peripheral Vascular Atherectomy;  Surgeon: Lorretta Harp, MD;  Location: [place] [facility];  Service: Cardiovascular;  Laterality: Left;  . PERIPHERAL VASCULAR BALLOON ANGIOPLASTY Left 01/16/2019   Procedure: PERIPHERAL VASCULAR BALLOON ANGIOPLASTY;  Surgeon: [name], MD;  Location: [place] [facility];  Service: Cardiovascular;  Laterality: Left;  central vein  . REVISON OF ARTERIOVENOUS FISTULA Left 08/13/7508   Procedure: PLICATION OF LEFT BRACHIOCEPHALIC ARTERIOVENOUS FISTULA;  Surgeon: Conrad Foxholm, MD;  Location: [place];  Service: Vascular;  Laterality: Left;  . SHUNTOGRAM N/A 12/09/2012   Procedure: fistulogram;  Surgeon: Serafina Mitchell, MD;  Location: [facility] CATH LAB;  Service: Cardiovascular;  Laterality: N/A;  .  SHUNTOGRAM Left 06/03/2013   Procedure: Fistulogram;  Surgeon: Serafina Mitchell, MD;  Location: [facility] CATH LAB;  Service: Cardiovascular;  Laterality: Left;  . THROMBECTOMY W/ EMBOLECTOMY Left 12/11/2012   Procedure: THROMBECTOMY ARTERIOVENOUS FISTULA;  Surgeon: Serafina Mitchell, MD;  Location: [place];  Service: Vascular;  Laterality: Left;  . TONSILLECTOMY    . WOUND DEBRIDEMENT Right 02/11/2019   Procedure: DEBRIDEMENT WOUND RIGHT BELOW THE KNEE STUMP;  Surgeon: Serafina Mitchell, MD;  Location[facility];  Service: Vascular;  Laterality: Right;          Current Outpatient Medications on File Prior to Visit  Medication Sig Dispense Refill  . albuterol (VENTOLIN HFA) 108 (90 Base) MCG/ACT inhaler Inhale 2 puffs into the lungs every 6 (six) hours as needed for wheezing or shortness of breath.     . ALPRAZolam (XANAX) 0.25 MG tablet Take 0.25 mg by mouth 2 (two) times daily as needed.    . ARTIFICIAL TEAR OP Place 1 drop into both eyes every 6 (six) hours as needed (dry eyes).    Marland Kitchen atorvastatin (LIPITOR) 80 MG tablet Take 1 tablet (80 mg total)  by mouth at bedtime. (Patient taking differently: Take 80 mg by mouth daily.) 30 tablet 5  . bismuth subsalicylate (PEPTO BISMOL) 262 MG/15ML suspension Take 30 mLs by mouth every 6 (six) hours as needed for indigestion.    . calcium acetate (PHOSLO) 667 MG capsule Take 4 capsules (2,668 mg total) by mouth 3 (three) times daily with meals. (Patient taking differently: Take by mouth See admin instructions. Take 2 tabs with meals and 1334 with each snack) 360 capsule 0  . cinacalcet (SENSIPAR) 30 MG tablet Take 1 tablet (30 mg total) by mouth every evening. 60 tablet 0  . clopidogrel (PLAVIX) 75 MG tablet Take 1 tablet (75 mg total) by mouth daily. 30 tablet 0  . cyclobenzaprine (FLEXERIL) 10 MG tablet Take 10 mg by mouth 3 (three) times daily as needed.    . diphenhydrAMINE (BENADRYL) 25 MG tablet Take 25 mg by mouth daily as needed for itching.    .  DULoxetine (CYMBALTA) 30 MG capsule Take 30 mg by mouth daily.    Marland Kitchen GLOBAL EASE INJECT PEN NEEDLES 32G X 4 MM MISC TO BE USED DAILY WITH INSULIN    . ibuprofen (ADVIL) 200 MG tablet Take 400 mg by mouth 3 (three) times daily.    Marland Kitchen levothyroxine (SYNTHROID) 125 MCG tablet Take 1 tablet (125 mcg total) by mouth daily before breakfast. 30 tablet 0  . lisinopril (ZESTRIL) 10 MG tablet Take 10 mg by mouth daily.     . Melatonin 10 MG CAPS Take 10 mg by mouth at bedtime.    . midodrine (PROAMATINE) 10 MG tablet Take 10 mg by mouth Every Tuesday,Thursday,and Saturday with dialysis.    Marland Kitchen multivitamin (RENA-VIT) TABS tablet Take 1 tablet by mouth daily.    . polyethylene glycol (MIRALAX / GLYCOLAX) 17 g packet Take 17 g by mouth daily as needed for moderate constipation.    . pregabalin (LYRICA) 150 MG capsule Take 1 capsule (150 mg total) by mouth 3 (three) times daily. 90 capsule 0  . sevelamer carbonate (RENVELA) 800 MG tablet Take 1,600-3,200 mg by mouth See admin instructions. Take 3200 mg with each meal and 1600 mg with each snack    . topiramate (TOPAMAX) 25 MG tablet Take 2 tablets (50 mg total) by mouth 2 (two) times daily. To be given post dialysis, titrated as directed to patient 360 tablet 0  . TOUJEO SOLOSTAR 300 UNIT/ML SOPN Inject 54-78 Units as directed at bedtime as needed (if blood sugar is 130 or higher).               Current Facility-Administered Medications on File Prior to Visit  Medication Dose Route Frequency Provider Last Rate Last Admin  . 0.9 %  sodium chloride infusion  250 mL Intravenous PRN [name], MD       Physical exam:   Vitals:   10/24/20 0902  BP: (!) 171/66  Pulse: 71  Resp: 20  Temp: 97.8 F (36.6 C)  TempSrc: Oral  SpO2: 97%  Weight: 108.9 kg  Height: 6\' 4"  (1.93 m)   Extremities: Well-healed right above-knee amputation, left lower extremity well-healed incisions dark spot tip of left first toe patient states is healing  insistent with prior blister  Neuro: Symmetric upper extremity lower extremity motor strength 5/5  Data: I reviewed his previous carotid duplex exam which shows greater than 80% left internal carotid artery stenosis.  Assessment: Patient would be a high risk left carotid endarterectomy candidate with prior history of  coronary artery disease, renal failure with end-stage renal disease, diabetes and COPD.  He did have a cardiac stress test in 2021 which did not show any significant ischemia.  Plan:  Left CEA today  He will also need a graft duplex scan of his left leg in March 2022.   [name], MD [facility] [place] Office: [phone]

## 2020-10-24 NOTE — Op Note (Signed)
Procedure: Left carotid endarterectomy  Preoperative diagnosis: Asymptomatic greater than 80% left internal carotid artery stenosis  Postoperative diagnosis: Same  Anesthesia: General  Assistant: Arlee Muslim, PA-C for assistance with exposure retraction creation of anastomosis  Operative findings: Severe calcific carotid bifurcation greater than 80% stenosis  Dacron patch  Operative details: After the informed consent, the patient taken the operating.  The patient placed in supine position operating table.  After induction general anesthesia endotracheal intubation patient's entire left neck and chest were prepped and draped in usual sterile fashion.  An oblique incision was made on the left side of the neck just anterior to the border the left sternocleidomastoid muscle.  Incision was carried on through the subcutaneous tissues down the level of the platysma muscle.  Platysma muscle was incised for the full length the incision.  Sternocleidomastoid muscle was identified and reflected laterally.  Dissection was carried down the level of the common carotid artery.  This was dissected free circumferentially and a vessel loop placed around it.  Dissection was then carried up to the carotid bifurcation.  It was heavily calcified.  The external carotid artery was dissected free circumferentially and vessel placed around this.  The distal internal carotid artery was dissected free circumferentially above the area of calcified vessel and just below the hypoglossal nerve.  The ansa cervicalis was identified as well as its insertion the hypoglossal and this was well above the area of dissection.  Vagus nerve is identified and protected.  In order to obtain adequate length on the common carotid for the endarterectomy segment the omohyoid muscle was transected with cautery.  Patient was given 10,000 units of intravenous heparin which achieved an ACT of 260.  The internal carotid artery was controlled distally  with a fine blood clamp the external carotid artery was controlled with a vessel loop and the common carotid artery controlled with a peripheral DeBakey clamp.  Longitudinal plane was made in the common carotid artery just below the area of calcification in the arteriotomy extended distally with Potts scissors.  There was a large calcific plaque with greater than 80% stenosis.  The arteriotomy was extended through this to a more normal-appearing segment of distal internal carotid artery.  I attempted to place a 10 [place] shunt distally but due to the length of the shunt and there was some resistance distally this did not fit and I tried to slide this back further proximally.  It was noted that there was some air in the shunt.  This point I decided to remove the shunt completely.  There was vigorous backbleeding from the internal carotid artery so I decided not to replace this.  The common carotid artery was controlled with peripheral of a clamp.  Endarterectomy was begun in a suitable plane in the common carotid artery and extended with eversion technique open to the external carotid artery and a nice feathered endpoint was obtained in the internal carotid artery.  All loose debris was removed from the carotid bed.  The dacryon patch was then brought up in the operative field and sewn on as a patch angioplasty using a running 6-0 Prolene suture.  This prior to completion anastomosis it was for blood backbled and thoroughly flushed.  Anastomosis was secured clamps released flow was first restored retrograde from the external to the common carotid artery then common carotid external and then after approximately 5 cardiac cycles to the internal carotid artery.  Hemostasis was obtained with 1 repair stitch.  Patient was given 100  mg of protamine.  Arista powder was applied to the suture line to achieve final hemostasis.  The wound was thoroughly irrigated and all the Arista was removed.  The platysma was reapproximated  using a running 3-0 Vicryl suture.  The skin was closed with a 4-0 Vicryl subcuticular stitch.  Dermabond was applied.  Patient tolerated seizure well and there were no complications.  Instrument sponge and needle counts correct in the case.  Patient was taken the recovery room in stable condition.  He was moving upper extremity lower extremity symmetrically 5/5 motor strength.  [name], MD [facility] [place] Office: [phone]

## 2020-10-25 ENCOUNTER — Other Ambulatory Visit: Payer: Self-pay | Admitting: Physician Assistant

## 2020-10-25 ENCOUNTER — Encounter (HOSPITAL_COMMUNITY): Payer: Self-pay | Admitting: Vascular Surgery

## 2020-10-25 LAB — BASIC METABOLIC PANEL
Anion gap: 12 (ref 5–15)
BUN: 40 mg/dL — ABNORMAL HIGH (ref 8–23)
CO2: 26 mmol/L (ref 22–32)
Calcium: 8.6 mg/dL — ABNORMAL LOW (ref 8.9–10.3)
Chloride: 101 mmol/L (ref 98–111)
Creatinine, Ser: 8.85 mg/dL — ABNORMAL HIGH (ref 0.61–1.24)
GFR, Estimated: 6 mL/min — ABNORMAL LOW (ref >60)
Glucose, Bld: 117 mg/dL — ABNORMAL HIGH (ref 70–99)
Potassium: 4.2 mmol/L (ref 3.5–5.1)
Sodium: 139 mmol/L (ref 135–145)

## 2020-10-25 LAB — CBC
HCT: 24.9 % — ABNORMAL LOW (ref 39.0–52.0)
Hemoglobin: 8.1 g/dL — ABNORMAL LOW (ref 13.0–17.0)
MCH: 32.9 pg (ref 26.0–34.0)
MCHC: 32.5 g/dL (ref 30.0–36.0)
MCV: 101.2 fL — ABNORMAL HIGH (ref 80.0–100.0)
Platelets: 127 10*3/uL — ABNORMAL LOW (ref 150–400)
RBC: 2.46 MIL/uL — ABNORMAL LOW (ref 4.22–5.81)
RDW: 16.4 % — ABNORMAL HIGH (ref 11.5–15.5)
WBC: 5.6 10*3/uL (ref 4.0–10.5)
nRBC: 0 % (ref 0.0–0.2)

## 2020-10-25 LAB — HEPATITIS B SURFACE ANTIGEN: Hepatitis B Surface Ag: NONREACTIVE

## 2020-10-25 LAB — SURGICAL PATHOLOGY

## 2020-10-25 LAB — GLUCOSE, CAPILLARY: Glucose-Capillary: 95 mg/dL (ref 70–99)

## 2020-10-25 MED ORDER — MIDODRINE HCL 5 MG PO TABS
ORAL_TABLET | ORAL | Status: AC
  Administered 2020-10-25: 10 mg via ORAL
  Filled 2020-10-25: qty 2

## 2020-10-25 MED ORDER — DARBEPOETIN ALFA 60 MCG/0.3ML IJ SOSY
PREFILLED_SYRINGE | INTRAMUSCULAR | Status: AC
  Administered 2020-10-25: 60 ug via INTRAVENOUS
  Filled 2020-10-25: qty 0.3

## 2020-10-25 MED ORDER — PROCHLORPERAZINE 25 MG RE SUPP: 25.0000 mg | Freq: Two times a day (BID) | RECTAL | 0 refills | Status: DC | PRN

## 2020-10-25 MED ORDER — HYDROMORPHONE HCL 1 MG/ML IJ SOLN
INTRAMUSCULAR | Status: AC
  Administered 2020-10-25: 1 mg via INTRAVENOUS
  Filled 2020-10-25: qty 1

## 2020-10-25 MED ORDER — OXYCODONE HCL 5 MG PO TABS: 5.0000 mg | ORAL_TABLET | ORAL | 0 refills | Status: DC | PRN

## 2020-10-25 NOTE — Discharge Instructions (Signed)
   [facility] [place]  Discharge Instructions   Carotid Endarterectomy (CEA)  Please refer to the following instructions for your post-procedure care. Your surgeon or physician assistant will discuss any changes with you.  Activity  You are encouraged to walk as much as you can. You can slowly return to normal activities but must avoid strenuous activity and heavy lifting until your doctor tell you it's OK. Avoid activities such as vacuuming or swinging a golf club. You can drive after one week if you are comfortable and you are no longer taking prescription pain medications. It is normal to feel tired for serval weeks after your surgery. It is also normal to have difficulty with sleep habits, eating, and bowel movements after surgery. These will go away with time.  Bathing/Showering  You may shower after you come home. Do not soak in a bathtub, hot tub, or swim until the incision heals completely.  Incision Care  Shower every day. Clean your incision with mild soap and water. Pat the area dry with a clean towel. You do not need a bandage unless otherwise instructed. Do not apply any ointments or creams to your incision. You may have skin glue on your incision. Do not peel it off. It will come off on its own in about one week. Your incision may feel thickened and raised for several weeks after your surgery. This is normal and the skin will soften over time. For Men Only: It's OK to shave around the incision but do not shave the incision itself for 2 weeks. It is common to have numbness under your chin that could last for several months.  Diet  Resume your normal diet. There are no special food restrictions following this procedure. A low fat/low cholesterol diet is recommended for all patients with vascular disease. In order to heal from your surgery, it is CRITICAL to get adequate nutrition. Your body requires vitamins, minerals, and protein. Vegetables are the best  source of vitamins and minerals. Vegetables also provide the perfect balance of protein. Processed food has little nutritional value, so try to avoid this.        Medications  Resume taking all of your medications unless your doctor or physician assistant tells you not to. If your incision is causing pain, you may take over-the- counter pain relievers such as acetaminophen (Tylenol). If you were prescribed a stronger pain medication, please be aware these medications can cause nausea and constipation. Prevent nausea by taking the medication with a snack or meal. Avoid constipation by drinking plenty of fluids and eating foods with a high amount of fiber, such as fruits, vegetables, and grains. Do not take Tylenol if you are taking prescription pain medications.  Follow Up  Our office will schedule a follow up appointment 2-3 weeks following discharge.  Please call us immediately for any of the following conditions  Increased pain, redness, drainage (pus) from your incision site. Fever of 101 degrees or higher. If you should develop stroke (slurred speech, difficulty swallowing, weakness on one side of your body, loss of vision) you should call 911 and go to the nearest emergency room.  Reduce your risk of vascular disease:  Stop smoking. If you would like help call [facility] at 1-800-QUIT-NOW (1-800-784-8669) or [place] at [phone]. Manage your cholesterol Maintain a desired weight Control your diabetes Keep your blood pressure down  If you have any questions, please call the office at [phone].   

## 2020-10-25 NOTE — Procedures (Signed)
Patient seen on Hemodialysis. BP 130/89   Pulse 73   Temp 97.7 F (36.5 C) (Axillary)   Resp 17   Ht 6\' 4"  (1.93 m)   Wt 112.8 kg   SpO2 94%   BMI 30.27 kg/m   QB 400, UF goal 2L Tolerating treatment without complaints at this time. Had nausea/vomiting earlier and looking forward to going home later today.   Elmarie Shiley MD [facility]. Office # [phone] Pager # [phone] 9:16 AM

## 2020-10-25 NOTE — Progress Notes (Addendum)
  Progress Note    10/25/2020 7:45 AM 1 Day Post-Op  Subjective:  No neuro events overnight.  N/V this morning   Vitals:   10/24/20 2331 10/25/20 0500  BP: 126/62 124/69  Pulse: 65 61  Resp: 16 13  Temp: 97.6 F (36.4 C)   SpO2: 100% 94%   Physical Exam: Lungs:  Non labored Incisions:  L neck c/d/i Extremities:  Moving all extremities well Neurologic: a&O  CBC    Component Value Date/Time   WBC 5.6 10/25/2020 0500   RBC 2.46 (L) 10/25/2020 0500   HGB 8.1 (L) 10/25/2020 0500   HGB 11.5 (L) 02/08/2017 1409   HCT 24.9 (L) 10/25/2020 0500   HCT 34.3 (L) 02/08/2017 1409   PLT 127 (L) 10/25/2020 0500   PLT 208 02/08/2017 1409   MCV 101.2 (H) 10/25/2020 0500   MCV 94 02/08/2017 1409   MCH 32.9 10/25/2020 0500   MCHC 32.5 10/25/2020 0500   RDW 16.4 (H) 10/25/2020 0500   RDW 16.7 (H) 02/08/2017 1409   LYMPHSABS 1.5 12/11/2018 1425   LYMPHSABS 2.1 02/08/2017 1409   MONOABS 0.7 12/11/2018 1425   EOSABS 0.3 12/11/2018 1425   EOSABS 0.2 02/08/2017 1409   BASOSABS 0.0 12/11/2018 1425   BASOSABS 0.0 02/08/2017 1409    BMET    Component Value Date/Time   NA 139 10/25/2020 0500   NA 134 02/08/2017 1403   K 4.2 10/25/2020 0500   CL 101 10/25/2020 0500   CO2 26 10/25/2020 0500   GLUCOSE 117 (H) 10/25/2020 0500   BUN 40 (H) 10/25/2020 0500   BUN 67 (H) 02/08/2017 1403   CREATININE 8.85 (H) 10/25/2020 0500   CALCIUM 8.6 (L) 10/25/2020 0500   GFRNONAA 6 (L) 10/25/2020 0500   GFRAA 6 (L) 04/07/2020 0248    INR    Component Value Date/Time   INR 1.2 10/20/2020 1554     Intake/Output Summary (Last 24 hours) at 10/25/2020 0745 Last data filed at 10/24/2020 1215 Gross per 24 hour  Intake 800 ml  Output 30 ml  Net 770 ml     Assessment/Plan:  70 y.o. male is s/p L CEA 1 Day Post-Op   Neuro exam at baseline HD this morning per Nephrology D/c home after HD if N/V resolved   Dagoberto Ligas, PA-C [facility] [phone] 10/25/2020 7:45  AM  Nausea resolving No neck hematoma Neuro UE LE 5/5 motor Tongue midline Home later today if feels ok after HD  [name], MD [facility] [place] Office: [phone]

## 2020-10-26 DIAGNOSIS — I120 Hypertensive chronic kidney disease with stage 5 chronic kidney disease or end stage renal disease: Secondary | ICD-10-CM | POA: Diagnosis not present

## 2020-10-26 DIAGNOSIS — N186 End stage renal disease: Secondary | ICD-10-CM | POA: Diagnosis not present

## 2020-10-26 DIAGNOSIS — E1140 Type 2 diabetes mellitus with diabetic neuropathy, unspecified: Secondary | ICD-10-CM | POA: Diagnosis not present

## 2020-10-26 DIAGNOSIS — Z794 Long term (current) use of insulin: Secondary | ICD-10-CM | POA: Diagnosis not present

## 2020-10-26 DIAGNOSIS — Z992 Dependence on renal dialysis: Secondary | ICD-10-CM | POA: Diagnosis not present

## 2020-10-26 DIAGNOSIS — Z7902 Long term (current) use of antithrombotics/antiplatelets: Secondary | ICD-10-CM | POA: Diagnosis not present

## 2020-10-26 DIAGNOSIS — D631 Anemia in chronic kidney disease: Secondary | ICD-10-CM | POA: Diagnosis not present

## 2020-10-26 DIAGNOSIS — Z89611 Acquired absence of right leg above knee: Secondary | ICD-10-CM | POA: Diagnosis not present

## 2020-10-26 DIAGNOSIS — I6522 Occlusion and stenosis of left carotid artery: Secondary | ICD-10-CM | POA: Diagnosis not present

## 2020-10-26 DIAGNOSIS — Z48812 Encounter for surgical aftercare following surgery on the circulatory system: Secondary | ICD-10-CM | POA: Diagnosis not present

## 2020-10-26 DIAGNOSIS — E1122 Type 2 diabetes mellitus with diabetic chronic kidney disease: Secondary | ICD-10-CM | POA: Diagnosis not present

## 2020-10-26 NOTE — Discharge Summary (Signed)
Discharge Summary     [name] Aug 03, 1951 70 y.o. male  [id number]  Admission Date: 10/24/2020  Discharge Date: 10/25/20  Physician: Dr. [name]  Admission Diagnosis: Carotid artery stenosis [I65.29]  Discharge Day services:    see progress note 10/25/20  Hospital Course:  The patient was admitted to the hospital and taken to the operating room on 10/24/2020 and underwent left carotid endarterectomy.  He tolerated the procedure well and was admitted to the hospital postoperatively.  Nephrology was consulted for management of ESRD on HD during inpatient stay.  POD #1 his neuro exam remained at baseline however he did have nausea and vomiting.  This resolved after his HD treatment and patient was feeling good enough for discharge home.  He was prescribed 1 to 2 days of narcotic pain medication for continued postoperative pain control.  He will follow-up in office in 2 to 3 weeks.  At that time we will also check left lower extremity bypass graft surveillance.  He was discharged home in stable condition.   Recent Labs    10/24/20 0929 10/25/20 0500  NA 141 139  K 4.4 4.2  CL 99 101  CO2  --  26  GLUCOSE 124* 117*  BUN 37* 40*  CALCIUM  --  8.6*   Recent Labs    10/24/20 0929 10/25/20 0500  WBC  --  5.6  HGB 9.5* 8.1*  HCT 28.0* 24.9*  PLT  --  127*   No results for input(s): INR in the last 72 hours.     Discharge Diagnosis:  Carotid artery stenosis [I65.29]  Secondary Diagnosis: Patient Active Problem List   Diagnosis Date Noted  . Carotid artery stenosis 10/24/2020  . Optic neuropathy 07/29/2020  . CAD (coronary artery disease)   . Diabetic ulcer of ankle ([place]) 12/28/2019  . Non-pressure chronic ulcer of unspecified ankle with unspecified severity ([place]) 12/28/2019  . Hx of AKA (above knee amputation) ([place]) 08/31/2019  . Major depressive disorder, recurrent, moderate ([place]) 03/25/2019  . Edema 12/31/2018  . Post-operative pain   . Labile blood  pressure   . Labile blood glucose   . Hx of anxiety disorder   . Hypoglycemia   . Diabetic peripheral neuropathy ([place])   . Neuropathic pain   . S/P unilateral BKA (below knee amputation), right ([place])   . Sleep disturbance   . Type 2 diabetes mellitus with peripheral neuropathy (HCC)   . Benign essential HTN   . Acute blood loss anemia   . Anemia of chronic disease   . Right below-knee amputee ([place]) 12/03/2018  . Necrotizing fasciitis of ankle and foot ([place])   . Unilateral complete BKA, right, initial encounter ([place])   . Postoperative pain   . Phantom limb pain ([place])   . Drug induced constipation   . Poorly controlled type 2 diabetes mellitus with peripheral neuropathy ([place])   . Acute on chronic anemia   . Ischemia of extremity 12/01/2018  . Cellulitis of left leg 09/10/2018  . Melena 11/19/2017  . Unilateral primary osteoarthritis, right knee 08/09/2017  . Body mass index (BMI) of 30.0 to 30.9 in adult 05/10/2017  . Metabolic disorder 37/05/6268  . 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 PAD (peripheral artery disease) ([place]) 02/12/2017  . Critical lower limb ischemia ([place]) 02/11/2017  . Dependence on renal dialysis ([place]) 01/14/2017  . Patient's noncompliance with other medical treatment and regimen 01/14/2017  . Anemia in chronic kidney disease 01/14/2017  . Type  2 diabetes mellitus with complication ([place]) 61/95/0932  . Type 2 diabetes mellitus with diabetic neuropathy ([place]) 01/14/2017  . Other specified personal risk factors, not elsewhere classified 09/12/2016  . Chronic obstructive pulmonary disease with acute lower respiratory infection ([place]) 09/12/2016  . Hardening of the aorta (main artery of the heart) ([place]) 06/20/2016  . Hypothyroidism, unspecified 06/20/2016  . Gastro-esophageal reflux disease with esophagitis 03/12/2016  . Peripheral neuropathy 02/16/2016  . Abdominal aortic aneurysm, without rupture ([place]) 12/07/2015  . Foot drop,  left foot 12/07/2015  . Repeated falls 12/07/2015  . Laceration without foreign body of other part of head, initial encounter 10/04/2015  . Acute respiratory failure with hypoxia ([place]) 08/28/2015  . Hypertensive urgency 08/28/2015  . Chronic anemia 08/28/2015  . ESRD on dialysis ([place]) 08/28/2015  . Diabetes mellitus with end stage renal disease ([place]) 08/28/2015  . Dysuria 08/01/2015  . Disequilibrium 06/20/2015  . Other idiopathic peripheral autonomic neuropathy 02/09/2015  . Contusion of right elbow 12/20/2014  . Contusion of right hip 12/20/2014  . Contusion of right shoulder 12/20/2014  . Abrasion of right upper arm 11/28/2014  . Abrasion, right lower leg, initial encounter 11/28/2014  . Strain of muscle, fascia and tendon of right hip, initial encounter 11/28/2014  . Radiohumeral (joint) sprain of right elbow, initial encounter 11/28/2014  . PVD (peripheral vascular disease) ([place]) 07/15/2014  . Peripheral vascular disease, unspecified ([place]) 01/14/2014  . Chronic kidney disease, stage V ([place]) 09/22/2013  . Low back pain 06/02/2013  . Asthma with exacerbation 05/21/2013  . Pain in limb-left arm 04/09/2013  . Guaiac + stool 04/07/2013  . Anemia 04/07/2013  . Dysphagia, unspecified(787.20) 04/07/2013  . Numbness and tingling-left arm  02/04/2013  . Cold hands and feet-left arm 02/04/2013  . Other complications due to renal dialysis device, implant, and graft 06/06/2012  . Tobacco use disorder 05/23/2012  . Osteoarthrosis 05/23/2012  . Mixed hyperlipidemia 05/23/2012  . End stage renal disease ([place]) 02/05/2012  . Injury to blood vessels, unspecified site 02/05/2012  . Cellulitis and abscess of face 12/14/2011  . Essential hypertension 11/27/2011  . Pain in joint, shoulder region 09/14/2011  . Sprain and strain of shoulder and upper arm 09/05/2011  . Infective otitis externa 05/17/2011   Past Medical History:  Diagnosis Date  . Anemia   . Anxiety   . Arthritis   . CAD  (coronary artery disease)    Myoview August 2021 with evidence of large inferior scar but no active ischemia  . COPD (chronic obstructive pulmonary disease) ([place])   . Depression   . ESRD on hemodialysis ([place])   . Essential hypertension   . GERD (gastroesophageal reflux disease)   . H/O hiatal hernia   . Headache(784.0)   . History of kidney stones   . History of pneumonia   . Hypothyroidism   . Neuropathy   . Non-healing wound of amputation stump (HCC)    Right  . Peripheral vascular disease ([place])   . PTSD (post-traumatic stress disorder)   . Type 2 diabetes mellitus (HCC)    Type II    Allergies as of 10/25/2020      Reactions   Ace Inhibitors    Unknown reaction, tolerates lisinopril    Clonidine    Unknown reaction    Morphine And Related Other (See Comments)   Not effective   Oxytocin    unknown      Medication List    TAKE these medications   albuterol 108 (90 Base) MCG/ACT  inhaler Commonly known as: VENTOLIN HFA Inhale 2 puffs into the lungs every 6 (six) hours as needed for wheezing or shortness of breath.   ALPRAZolam 0.5 MG tablet Commonly known as: XANAX Take 0.5 mg by mouth 2 (two) times daily as needed for anxiety.   ARTIFICIAL TEAR OP Place 1 drop into both eyes every 6 (six) hours as needed (dry eyes).   atorvastatin 80 MG tablet Commonly known as: LIPITOR Take 1 tablet (80 mg total) by mouth at bedtime. What changed: when to take this   bismuth subsalicylate 947 SJ/62EZ suspension Commonly known as: PEPTO BISMOL Take 30 mLs by mouth every 6 (six) hours as needed for indigestion.   calcium acetate 667 MG capsule Commonly known as: PHOSLO Take 4 capsules (2,668 mg total) by mouth 3 (three) times daily with meals. What changed:   how much to take  when to take this  additional instructions   cinacalcet 30 MG tablet Commonly known as: SENSIPAR Take 1 tablet (30 mg total) by mouth every evening.   clopidogrel 75 MG tablet Commonly known  as: PLAVIX Take 1 tablet (75 mg total) by mouth daily.   cyclobenzaprine 10 MG tablet Commonly known as: FLEXERIL Take 10 mg by mouth 3 (three) times daily as needed for muscle spasms.   diphenhydrAMINE 25 MG tablet Commonly known as: BENADRYL Take 25 mg by mouth daily as needed for itching.   DULoxetine 30 MG capsule Commonly known as: CYMBALTA Take 30 mg by mouth daily.   Global Ease Inject Pen Needles 32G X 4 MM Misc Generic drug: Insulin Pen Needle TO BE USED DAILY WITH INSULIN   ibuprofen 200 MG tablet Commonly known as: ADVIL Take 400 mg by mouth in the morning and at bedtime.   levothyroxine 125 MCG tablet Commonly known as: SYNTHROID Take 1 tablet (125 mcg total) by mouth daily before breakfast.   lisinopril 10 MG tablet Commonly known as: ZESTRIL Take 10 mg by mouth daily.   Melatonin 10 MG Caps Take 10 mg by mouth at bedtime.   midodrine 10 MG tablet Commonly known as: PROAMATINE Take 10 mg by mouth Every Tuesday,Thursday,and Saturday with dialysis.   multivitamin Tabs tablet Take 1 tablet by mouth daily.   oxyCODONE 5 MG immediate release tablet Commonly known as: Oxy IR/ROXICODONE Take 1 tablet (5 mg total) by mouth every 4 (four) hours as needed for moderate pain.   polyethylene glycol 17 g packet Commonly known as: MIRALAX / GLYCOLAX Take 17 g by mouth daily.   pregabalin 150 MG capsule Commonly known as: LYRICA Take 1 capsule (150 mg total) by mouth 3 (three) times daily.   sevelamer carbonate 800 MG tablet Commonly known as: RENVELA Take 800-1,600 mg by mouth See admin instructions. Take 1600 mg with each meal and 800 mg with each snack   topiramate 25 MG tablet Commonly known as: TOPAMAX Take 2 tablets (50 mg total) by mouth 2 (two) times daily. To be given post dialysis, titrated as directed to patient   Toujeo SoloStar 300 UNIT/ML Solostar Pen Generic drug: insulin glargine (1 Unit Dial) Inject 40-78 Units into the skin at bedtime as  needed (if blood sugar is 130 or higher).        Discharge Instructions:   [facility] Discharge Instructions Carotid Endarterectomy (CEA)  Please refer to the following instructions for your post-procedure care. Your surgeon or physician assistant will discuss any changes with you.  Activity  You are encouraged  to walk as much as you can. You can slowly return to normal activities but must avoid strenuous activity and heavy lifting until your doctor tell you it's OK. Avoid activities such as vacuuming or swinging a golf club. You can drive after one week if you are comfortable and you are no longer taking prescription pain medications. It is normal to feel tired for serval weeks after your surgery. It is also normal to have difficulty with sleep habits, eating, and bowel movements after surgery. These will go away with time.  Bathing/Showering  You may shower after you come home. Do not soak in a bathtub, hot tub, or swim until the incision heals completely.  Incision Care  Shower every day. Clean your incision with mild soap and water. Pat the area dry with a clean towel. You do not need a bandage unless otherwise instructed. Do not apply any ointments or creams to your incision. You may have skin glue on your incision. Do not peel it off. It will come off on its own in about one week. Your incision may feel thickened and raised for several weeks after your surgery. This is normal and the skin will soften over time. For Men Only: It's OK to shave around the incision but do not shave the incision itself for 2 weeks. It is common to have numbness under your chin that could last for several months.  Diet  Resume your normal diet. There are no special food restrictions following this procedure. A low fat/low cholesterol diet is recommended for all patients with vascular disease. In order to heal from your surgery, it is CRITICAL to get adequate nutrition.  Your body requires vitamins, minerals, and protein. Vegetables are the best source of vitamins and minerals. Vegetables also provide the perfect balance of protein. Processed food has little nutritional value, so try to avoid this.  Medications  Resume taking all of your medications unless your doctor or physician assistant tells you not to.  If your incision is causing pain, you may take over-the- counter pain relievers such as acetaminophen (Tylenol). If you were prescribed a stronger pain medication, please be aware these medications can cause nausea and constipation.  Prevent nausea by taking the medication with a snack or meal. Avoid constipation by drinking plenty of fluids and eating foods with a high amount of fiber, such as fruits, vegetables, and grains. Do not take Tylenol if you are taking prescription pain medications.  Follow Up  Our office will schedule a follow up appointment 2-3 weeks following discharge.  Please call us immediately for any of the following conditions  . Increased pain, redness, drainage (pus) from your incision site. . Fever of 101 degrees or higher. . If you should develop stroke (slurred speech, difficulty swallowing, weakness on one side of your body, loss of vision) you should call 911 and go to the nearest emergency room. .  Reduce your risk of vascular disease:  . Stop smoking. If you would like help call [facility] at 1-800-QUIT-NOW 510-032-1699) or [place] at [phone]. . Manage your cholesterol . Maintain a desired weight . Control your diabetes . Keep your blood pressure down .  If you have any questions, please call the office at [phone].   Disposition: home  Patient's condition: is Good  Follow up: 1. Dr. [name] in 3 weeks.   Dagoberto Ligas, PA-C [facility] [phone]   --- For [facility] Registry use ---   Modified Rankin score at D/C (0-6): 0  IV medication needed for:  1. Hypertension: No 2.  Hypotension: No  Post-op Complications: No  1. Post-op CVA or TIA: No  If yes: Event classification (right eye, left eye, right cortical, left cortical, verterobasilar, other):   If yes: Timing of event (intra-op, <6 hrs post-op, >=6 hrs post-op, unknown):   2. CN injury: No  If yes: CN  injuried   3. Myocardial infarction: No  If yes: Dx by (EKG or clinical, Troponin):   4.  CHF: No  5.  Dysrhythmia (new): No  6. Wound infection: No  7. Reperfusion symptoms: No  8. Return to OR: No  If yes: return to OR for (bleeding, neurologic, other CEA incision, other):   Discharge medications: Statin use:  Yes ASA use:  No   Beta blocker use:  No ACE-Inhibitor use:  Yes  ARB use:  No CCB use: No P2Y12 Antagonist use: Yes, [x ] Plavix, [ ]  Plasugrel, [ ]  Ticlopinine, [ ]  Ticagrelor, [ ]  Other, [ ]  No for medical reason, [ ]  Non-compliant, [ ]  Not-indicated Anti-coagulant use:  No, [ ]  Warfarin, [ ]  Rivaroxaban, [ ]  Dabigatran,

## 2020-10-27 DIAGNOSIS — Z992 Dependence on renal dialysis: Secondary | ICD-10-CM | POA: Diagnosis not present

## 2020-10-27 DIAGNOSIS — D509 Iron deficiency anemia, unspecified: Secondary | ICD-10-CM | POA: Diagnosis not present

## 2020-10-27 DIAGNOSIS — N2581 Secondary hyperparathyroidism of renal origin: Secondary | ICD-10-CM | POA: Diagnosis not present

## 2020-10-27 DIAGNOSIS — N186 End stage renal disease: Secondary | ICD-10-CM | POA: Diagnosis not present

## 2020-10-27 DIAGNOSIS — D631 Anemia in chronic kidney disease: Secondary | ICD-10-CM | POA: Diagnosis not present

## 2020-10-28 ENCOUNTER — Other Ambulatory Visit (HOSPITAL_COMMUNITY): Payer: Medicare Other

## 2020-10-29 DIAGNOSIS — D509 Iron deficiency anemia, unspecified: Secondary | ICD-10-CM | POA: Diagnosis not present

## 2020-10-29 DIAGNOSIS — D631 Anemia in chronic kidney disease: Secondary | ICD-10-CM | POA: Diagnosis not present

## 2020-10-29 DIAGNOSIS — Z992 Dependence on renal dialysis: Secondary | ICD-10-CM | POA: Diagnosis not present

## 2020-10-29 DIAGNOSIS — N2581 Secondary hyperparathyroidism of renal origin: Secondary | ICD-10-CM | POA: Diagnosis not present

## 2020-10-29 DIAGNOSIS — N186 End stage renal disease: Secondary | ICD-10-CM | POA: Diagnosis not present

## 2020-10-31 DIAGNOSIS — N2581 Secondary hyperparathyroidism of renal origin: Secondary | ICD-10-CM | POA: Diagnosis not present

## 2020-10-31 DIAGNOSIS — Z48812 Encounter for surgical aftercare following surgery on the circulatory system: Secondary | ICD-10-CM | POA: Diagnosis not present

## 2020-10-31 DIAGNOSIS — E1122 Type 2 diabetes mellitus with diabetic chronic kidney disease: Secondary | ICD-10-CM | POA: Diagnosis not present

## 2020-10-31 DIAGNOSIS — I6522 Occlusion and stenosis of left carotid artery: Secondary | ICD-10-CM | POA: Diagnosis not present

## 2020-10-31 DIAGNOSIS — D509 Iron deficiency anemia, unspecified: Secondary | ICD-10-CM | POA: Diagnosis not present

## 2020-10-31 DIAGNOSIS — D631 Anemia in chronic kidney disease: Secondary | ICD-10-CM | POA: Diagnosis not present

## 2020-10-31 DIAGNOSIS — N186 End stage renal disease: Secondary | ICD-10-CM | POA: Diagnosis not present

## 2020-10-31 DIAGNOSIS — I120 Hypertensive chronic kidney disease with stage 5 chronic kidney disease or end stage renal disease: Secondary | ICD-10-CM | POA: Diagnosis not present

## 2020-10-31 DIAGNOSIS — Z992 Dependence on renal dialysis: Secondary | ICD-10-CM | POA: Diagnosis not present

## 2020-11-01 DIAGNOSIS — N186 End stage renal disease: Secondary | ICD-10-CM | POA: Diagnosis not present

## 2020-11-01 DIAGNOSIS — N2581 Secondary hyperparathyroidism of renal origin: Secondary | ICD-10-CM | POA: Diagnosis not present

## 2020-11-01 DIAGNOSIS — D509 Iron deficiency anemia, unspecified: Secondary | ICD-10-CM | POA: Diagnosis not present

## 2020-11-01 DIAGNOSIS — Z992 Dependence on renal dialysis: Secondary | ICD-10-CM | POA: Diagnosis not present

## 2020-11-01 DIAGNOSIS — D631 Anemia in chronic kidney disease: Secondary | ICD-10-CM | POA: Diagnosis not present

## 2020-11-03 DIAGNOSIS — D631 Anemia in chronic kidney disease: Secondary | ICD-10-CM | POA: Diagnosis not present

## 2020-11-03 DIAGNOSIS — Z992 Dependence on renal dialysis: Secondary | ICD-10-CM | POA: Diagnosis not present

## 2020-11-03 DIAGNOSIS — N2581 Secondary hyperparathyroidism of renal origin: Secondary | ICD-10-CM | POA: Diagnosis not present

## 2020-11-03 DIAGNOSIS — D509 Iron deficiency anemia, unspecified: Secondary | ICD-10-CM | POA: Diagnosis not present

## 2020-11-03 DIAGNOSIS — N186 End stage renal disease: Secondary | ICD-10-CM | POA: Diagnosis not present

## 2020-11-04 DIAGNOSIS — Z48812 Encounter for surgical aftercare following surgery on the circulatory system: Secondary | ICD-10-CM | POA: Diagnosis not present

## 2020-11-04 DIAGNOSIS — N186 End stage renal disease: Secondary | ICD-10-CM | POA: Diagnosis not present

## 2020-11-04 DIAGNOSIS — I120 Hypertensive chronic kidney disease with stage 5 chronic kidney disease or end stage renal disease: Secondary | ICD-10-CM | POA: Diagnosis not present

## 2020-11-04 DIAGNOSIS — G932 Benign intracranial hypertension: Secondary | ICD-10-CM | POA: Diagnosis not present

## 2020-11-04 DIAGNOSIS — E1122 Type 2 diabetes mellitus with diabetic chronic kidney disease: Secondary | ICD-10-CM | POA: Diagnosis not present

## 2020-11-04 DIAGNOSIS — D631 Anemia in chronic kidney disease: Secondary | ICD-10-CM | POA: Diagnosis not present

## 2020-11-04 DIAGNOSIS — H4710 Unspecified papilledema: Secondary | ICD-10-CM | POA: Diagnosis not present

## 2020-11-04 DIAGNOSIS — I6522 Occlusion and stenosis of left carotid artery: Secondary | ICD-10-CM | POA: Diagnosis not present

## 2020-11-05 DIAGNOSIS — D509 Iron deficiency anemia, unspecified: Secondary | ICD-10-CM | POA: Diagnosis not present

## 2020-11-05 DIAGNOSIS — Z992 Dependence on renal dialysis: Secondary | ICD-10-CM | POA: Diagnosis not present

## 2020-11-05 DIAGNOSIS — N2581 Secondary hyperparathyroidism of renal origin: Secondary | ICD-10-CM | POA: Diagnosis not present

## 2020-11-05 DIAGNOSIS — N186 End stage renal disease: Secondary | ICD-10-CM | POA: Diagnosis not present

## 2020-11-05 DIAGNOSIS — D631 Anemia in chronic kidney disease: Secondary | ICD-10-CM | POA: Diagnosis not present

## 2020-11-07 DIAGNOSIS — N186 End stage renal disease: Secondary | ICD-10-CM | POA: Diagnosis not present

## 2020-11-07 DIAGNOSIS — Z48812 Encounter for surgical aftercare following surgery on the circulatory system: Secondary | ICD-10-CM | POA: Diagnosis not present

## 2020-11-07 DIAGNOSIS — D529 Folate deficiency anemia, unspecified: Secondary | ICD-10-CM | POA: Diagnosis not present

## 2020-11-07 DIAGNOSIS — E11621 Type 2 diabetes mellitus with foot ulcer: Secondary | ICD-10-CM | POA: Diagnosis not present

## 2020-11-07 DIAGNOSIS — D649 Anemia, unspecified: Secondary | ICD-10-CM | POA: Diagnosis not present

## 2020-11-07 DIAGNOSIS — K2100 Gastro-esophageal reflux disease with esophagitis, without bleeding: Secondary | ICD-10-CM | POA: Diagnosis not present

## 2020-11-07 DIAGNOSIS — I6522 Occlusion and stenosis of left carotid artery: Secondary | ICD-10-CM | POA: Diagnosis not present

## 2020-11-07 DIAGNOSIS — I120 Hypertensive chronic kidney disease with stage 5 chronic kidney disease or end stage renal disease: Secondary | ICD-10-CM | POA: Diagnosis not present

## 2020-11-07 DIAGNOSIS — D631 Anemia in chronic kidney disease: Secondary | ICD-10-CM | POA: Diagnosis not present

## 2020-11-07 DIAGNOSIS — I10 Essential (primary) hypertension: Secondary | ICD-10-CM | POA: Diagnosis not present

## 2020-11-07 DIAGNOSIS — E1143 Type 2 diabetes mellitus with diabetic autonomic (poly)neuropathy: Secondary | ICD-10-CM | POA: Diagnosis not present

## 2020-11-07 DIAGNOSIS — D519 Vitamin B12 deficiency anemia, unspecified: Secondary | ICD-10-CM | POA: Diagnosis not present

## 2020-11-07 DIAGNOSIS — E1142 Type 2 diabetes mellitus with diabetic polyneuropathy: Secondary | ICD-10-CM | POA: Diagnosis not present

## 2020-11-07 DIAGNOSIS — E1122 Type 2 diabetes mellitus with diabetic chronic kidney disease: Secondary | ICD-10-CM | POA: Diagnosis not present

## 2020-11-08 DIAGNOSIS — N186 End stage renal disease: Secondary | ICD-10-CM | POA: Diagnosis not present

## 2020-11-08 DIAGNOSIS — N2581 Secondary hyperparathyroidism of renal origin: Secondary | ICD-10-CM | POA: Diagnosis not present

## 2020-11-08 DIAGNOSIS — D631 Anemia in chronic kidney disease: Secondary | ICD-10-CM | POA: Diagnosis not present

## 2020-11-08 DIAGNOSIS — D509 Iron deficiency anemia, unspecified: Secondary | ICD-10-CM | POA: Diagnosis not present

## 2020-11-08 DIAGNOSIS — Z992 Dependence on renal dialysis: Secondary | ICD-10-CM | POA: Diagnosis not present

## 2020-11-09 ENCOUNTER — Other Ambulatory Visit: Payer: Self-pay | Admitting: *Deleted

## 2020-11-09 ENCOUNTER — Other Ambulatory Visit (HOSPITAL_COMMUNITY): Payer: Medicare Other

## 2020-11-09 ENCOUNTER — Encounter (HOSPITAL_COMMUNITY): Payer: Medicare Other

## 2020-11-09 DIAGNOSIS — I739 Peripheral vascular disease, unspecified: Secondary | ICD-10-CM

## 2020-11-10 ENCOUNTER — Encounter: Payer: Medicare Other | Attending: Physical Medicine & Rehabilitation | Admitting: Physical Medicine & Rehabilitation

## 2020-11-10 ENCOUNTER — Other Ambulatory Visit: Payer: Self-pay

## 2020-11-10 ENCOUNTER — Encounter: Payer: Self-pay | Admitting: Physical Medicine & Rehabilitation

## 2020-11-10 VITALS — BP 152/72 | Temp 97.8°F | Ht 76.0 in | Wt 238.0 lb

## 2020-11-10 DIAGNOSIS — N186 End stage renal disease: Secondary | ICD-10-CM | POA: Diagnosis not present

## 2020-11-10 DIAGNOSIS — D631 Anemia in chronic kidney disease: Secondary | ICD-10-CM | POA: Diagnosis not present

## 2020-11-10 DIAGNOSIS — Z720 Tobacco use: Secondary | ICD-10-CM | POA: Diagnosis not present

## 2020-11-10 DIAGNOSIS — Z89611 Acquired absence of right leg above knee: Secondary | ICD-10-CM | POA: Diagnosis not present

## 2020-11-10 DIAGNOSIS — G479 Sleep disorder, unspecified: Secondary | ICD-10-CM | POA: Diagnosis not present

## 2020-11-10 DIAGNOSIS — D509 Iron deficiency anemia, unspecified: Secondary | ICD-10-CM | POA: Diagnosis not present

## 2020-11-10 DIAGNOSIS — Z992 Dependence on renal dialysis: Secondary | ICD-10-CM | POA: Diagnosis not present

## 2020-11-10 DIAGNOSIS — I6522 Occlusion and stenosis of left carotid artery: Secondary | ICD-10-CM

## 2020-11-10 DIAGNOSIS — N2581 Secondary hyperparathyroidism of renal origin: Secondary | ICD-10-CM | POA: Diagnosis not present

## 2020-11-10 MED ORDER — NONFORMULARY OR COMPOUNDED ITEM: 1 refills | Status: DC

## 2020-11-10 NOTE — Progress Notes (Signed)
Subjective:    Patient ID: [name], male    DOB: 1950-08-26, 70 y.o.   MRN: [id number]  HPI Right-handed male with history of COPD who quit smoking 6 years ago, end-stage renal disease with hemodialysis, diabetes mellitus with peripheral neuropathy, HTN, right BKA later AKA presents with neuropathy.  Last clinic visit on 02/25/2019.  Since that time, pt states he has burning pain in his stump. He has a prosthesis but has not worn it due to a procedure on his other leg.  Pain started after initial procedure. Oxycodone, marijuana, laying flat improves the pain.  Sitting in his chair, exacerbates the pain, notes ill fitting chair. Radiates to hip.  Intermittent.  Denies falls. He is following up with [facility].  Describes allodynia.  He completed therapies. PCP prescribing Lyrica. Pt states HbA1c is 6.2. Sleep is poor. He continues to smoke 1/2 PPD.  Pain Inventory Average Pain 4 Pain Right Now 5 My pain is constant, sharp, burning, dull, stabbing, tingling and aching  In the last 24 hours, has pain interfered with the following? General activity 10 Relation with others 7 Enjoyment of life 10 What TIME of day is your pain at its worst? evening Sleep (in general) Poor  Pain is worse with: sitting Pain improves with: rest and medication Relief from Meds: 7b  Family History  Problem Relation Age of Onset  . Heart disease Father        Heart Disease before age 17  . Healthy Daughter   . Healthy Daughter    Social History   Socioeconomic History  . Marital status: Married    Spouse name: [name]  . Number of children: Not on file  . Years of education: Not on file  . Highest education level: Not on file  Occupational History  . Occupation: retired  Tobacco Use  . Smoking status: Former Smoker    Packs/day: 0.50    Years: 18.00    Pack years: 9.00    Types: Cigarettes    Quit date: 06/27/2020    Years since quitting: 0.3  . Smokeless tobacco: Never Used  Vaping Use   . Vaping Use: Never used  Substance and Sexual Activity  . Alcohol use: No    Alcohol/week: 0.0 standard drinks  . Drug use: Yes    Types: Marijuana    Comment: smokes everyday for pain relief  . Sexual activity: Not on file  Other Topics Concern  . Not on file  Social History Narrative   [name] is a 70 year old retired patient who lives with wife [name], his primary caregiver. He reports he is independent/assist with his care needs    He has transportation to medical appointments   Social Determinants of Health   Financial Resource Strain: Not on file  Food Insecurity: Not on file  Transportation Needs: Not on file  Physical Activity: Not on file  Stress: Not on file  Social Connections: Not on file  b Past Surgical History:  Procedure Laterality Date  . A/V FISTULAGRAM Left 01/16/2019   Procedure: A/V FISTULAGRAM;  Surgeon: [name], MD;  Location: [place] [facility];  Service: Cardiovascular;  Laterality: Left;  . ABDOMINAL AORTOGRAM W/LOWER EXTREMITY Bilateral 03/18/2020   Procedure: ABDOMINAL AORTOGRAM W/LOWER EXTREMITY;  Surgeon: [name], MD;  Location: [place] [facility];  Service: Cardiovascular;  Laterality: Bilateral;  . AMPUTATION Right 12/01/2018   Procedure: RIGHT AMPUTATION BELOW KNEE;  Surgeon: [name], MD;  Location:  [place] [facility];  Service: Vascular;  Laterality: Right;  . AMPUTATION Right 04/27/2019   Procedure: AMPUTATION BELOW KNEE REVISION;  Surgeon: [name], MD;  Location: [place];  Service: Vascular;  Laterality: Right;  . AMPUTATION Right 04/30/2019   Procedure: AMPUTATION ABOVE KNEE - RIGHT;  Surgeon: Waynetta Sandy, MD;  Location: [place];  Service: Vascular;  Laterality: Right;  . APPLICATION OF WOUND VAC Right 04/27/2019   Procedure: APPLICATION OF WOUND VAC;  Surgeon: [name], MD;  Location: [place];  Service: Vascular;  Laterality: Right;  . AV FISTULA PLACEMENT  2012      left arm   . AV FISTULA  PLACEMENT Left 12/18/2012   Procedure: ARTERIOVENOUS (AV) FISTULA CREATION;  Surgeon: Angelia Mould, MD;  Location: [place];  Service: Vascular;  Laterality: Left;  . COLONOSCOPY  10/26/2011   Procedure: COLONOSCOPY;  Surgeon: Rogene Houston, MD;  [facility];  Service: Endoscopy;  Laterality: N/A;  730  . EMBOLECTOMY Left 12/09/2012   Procedure: EMBOLECTOMY;  Surgeon: Serafina Mitchell, MD;  Location: [facility] CATH LAB;  Service: Cardiovascular;  Laterality: Left;  left arm venous embolization  . ENDARTERECTOMY Left 10/24/2020   Procedure: LEFT CAROTID ENDARTERECTOMY;  Surgeon: [name], MD;  Location: [place];  Service: Vascular;  Laterality: Left;  . ENDARTERECTOMY FEMORAL Left 04/04/2020   Procedure: ENDARTERECTOMY COMMON FEMORAL;  Surgeon: [name], MD;  Location: [facility] OR;  Service: Vascular;  Laterality: Left;  . ESOPHAGOGASTRODUODENOSCOPY (EGD) WITH ESOPHAGEAL DILATION N/A 04/23/2013   Procedure: ESOPHAGOGASTRODUODENOSCOPY (EGD) WITH ESOPHAGEAL DILATION;  Surgeon: Rogene Houston, MD;  [facility];  Service: Endoscopy;  Laterality: N/A;  200-moved to 930   . FEMORAL-POPLITEAL BYPASS GRAFT Left 04/04/2020   Procedure: FEMORAL- BELOW KNEE POPLITEAL BYPASS GRAFT NON REVERSED VEIN;  Surgeon: [name], MD;  Location: [place];  Service: Vascular;  Laterality: Left;  . FISTULA SUPERFICIALIZATION Left 06/18/2013   Procedure: FISTULA SUPERFICIALIZATION & LIGATION BRANCH X 1;  Surgeon: Mal Misty, MD;  Location: [place];  Service: Vascular;  Laterality: Left;  . GROIN DEBRIDEMENT Left 04/29/2020   Procedure: EXPLORATION LEFT GROIN WITH DEBRIDEMENT;  Surgeon: Rosetta Posner, MD;  Location: [place];  Service: Vascular;  Laterality: Left;  . HEMATOMA EVACUATION Right 02/11/2017   Procedure: EVACUATION HEMATOMA RIGHT GROIN, Repair of Right Pseudo-anerysm.;  Surgeon: [name], MD;  Location: [facility];  Service: Vascular;  Laterality: Right;  . INGUINAL HERNIA REPAIR      ,  times   2  . INSERTION OF DIALYSIS CATHETER Left 12/18/2012   Procedure: INSERTION OF DIALYSIS CATHETER;  Surgeon: Angelia Mould, MD;  Location: [place];  Service: Vascular;  Laterality: Left;  . KNEE ARTHROSCOPY  2011   Right Knee  . LOWER EXTREMITY ANGIOGRAPHY N/A 02/11/2017   Procedure: Lower Extremity Angiography;  Surgeon: Lorretta Harp, MD;  Location: [place] [facility];  Service: Cardiovascular;  Laterality: N/A;  . PERIPHERAL ATHRECTOMY  02/11/2017  . PERIPHERAL VASCULAR ATHERECTOMY Left 02/11/2017   Procedure: Peripheral Vascular Atherectomy;  Surgeon: Lorretta Harp, MD;  Location: [place] [facility];  Service: Cardiovascular;  Laterality: Left;  . PERIPHERAL VASCULAR BALLOON ANGIOPLASTY Left 01/16/2019   Procedure: PERIPHERAL VASCULAR BALLOON ANGIOPLASTY;  Surgeon: [name], MD;  Location: [place] [facility];  Service: Cardiovascular;  Laterality: Left;  central vein  . REVISON OF ARTERIOVENOUS FISTULA Left 01/07/7823   Procedure: PLICATION OF LEFT  BRACHIOCEPHALIC ARTERIOVENOUS FISTULA;  Surgeon: Conrad Loraine, MD;  Location: [place];  Service: Vascular;  Laterality: Left;  . SHUNTOGRAM N/A 12/09/2012   Procedure: fistulogram;  Surgeon: Serafina Mitchell, MD;  Location: [facility] CATH LAB;  Service: Cardiovascular;  Laterality: N/A;  . SHUNTOGRAM Left 06/03/2013   Procedure: Fistulogram;  Surgeon: Serafina Mitchell, MD;  Location: [facility] CATH LAB;  Service: Cardiovascular;  Laterality: Left;  . THROMBECTOMY W/ EMBOLECTOMY Left 12/11/2012   Procedure: THROMBECTOMY ARTERIOVENOUS FISTULA;  Surgeon: Serafina Mitchell, MD;  Location: [place];  Service: Vascular;  Laterality: Left;  . TONSILLECTOMY    . WOUND DEBRIDEMENT Right 02/11/2019   Procedure: DEBRIDEMENT WOUND RIGHT BELOW THE KNEE STUMP;  Surgeon: Serafina Mitchell, MD;  Location: [facility] OR;  Service: Vascular;  Laterality: Right;   Past Medical History:  Diagnosis Date  . Anemia   . Anxiety   . Arthritis   . CAD (coronary artery  disease)    Myoview August 2021 with evidence of large inferior scar but no active ischemia  . COPD (chronic obstructive pulmonary disease) ([place])   . Depression   . ESRD on hemodialysis ([place])   . Essential hypertension   . GERD (gastroesophageal reflux disease)   . H/O hiatal hernia   . Headache(784.0)   . History of kidney stones   . History of pneumonia   . Hypothyroidism   . Neuropathy   . Non-healing wound of amputation stump (HCC)    Right  . Peripheral vascular disease ([place])   . PTSD (post-traumatic stress disorder)   . Type 2 diabetes mellitus (HCC)    Type II   BP (!) 152/72   Temp 97.8 F (36.6 C)   Ht 6\' 4"  (1.93 m)   SpO2 98%   BMI 29.65 kg/m   Opioid Risk Score:   Fall Risk Score:  `1  Depression screen PHQ 2/9  Depression screen [facility] 2/9 11/10/2020 07/20/2019 05/06/2019  Decreased Interest 1 1 0  Down, Depressed, Hopeless 1 0 0  PHQ - 2 Score 2 1 0  Some recent data might be hidden     Review of Systems  Constitutional: Negative.   HENT: Negative.   Eyes: Negative.   Respiratory: Negative.   Cardiovascular: Positive for leg swelling.  Gastrointestinal: Positive for abdominal pain and nausea.  Endocrine: Negative.   Musculoskeletal: Positive for arthralgias, gait problem and myalgias.  Skin: Negative.   Allergic/Immunologic: Negative.        +Neuropathic pain  Neurological: Positive for tremors, weakness and numbness.  Hematological: Bruises/bleeds easily.  Psychiatric/Behavioral: Positive for dysphoric mood. The patient is nervous/anxious.   All other systems reviewed and are negative.      Objective:   Physical Exam  Constitutional: No distress . Vital signs reviewed. HENT: Normocephalic.  Atraumatic. Eyes: EOMI. No discharge. Cardiovascular: No JVD.   Respiratory: Normal effort.  No stridor.   GI: Non-distended.   Skin: Warm and dry.  Intact. Psych: Normal mood.  Normal behavior. Musc: Right AKA with tenderness LLE with edema and  tenderness Neurological: He is Alert Patient is alert and follows full commands.  Motor:  Left lower extremity: Hip flexion, knee extension 5/5, ankle dorsiflexion 4+/5 Right lower extremity: 4/5 hip flexion (pain inhibition)    Assessment & Plan:  Right-handed male with history of COPD who quit smoking 6 years ago, end-stage renal disease with hemodialysis, diabetes mellitus with peripheral neuropathy, HTN, right BKA later AKA presents with neuropathy.  1.   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   Cont [employer] prost[employer]   Cont follow up with Vascular   Continue elevation  Exacerbated by seat cushion - unsure if where he has received cusions and states he has done to different places. Instructed to follow up for fitting  2. Pain Management/Chronic pain syndrome:   Continue desensitization techniques  Lidoderm patches ineffective             Lyrica 150 mg 3 times a day per PCP  Elavil 10 was d/ced by patient and does not want to take even though benefits in hospital  Smoke marijuana  Compound medications ordered: Ketamine 10%, Baclofen 2%, Cyclobenzaprine 2%, Ketoprofen 10%, Gabapentin 6%, Bupivacaine 1%, Amitryptiline 5%, clonidine 0.2%  Patient would like to trial PNS after trial of compound medication   3. End-stage renal disease.   Recs per Nephro  4. Gait abnormality  Cont HEP  Cont wheelchair for safety  5. Sleep disturbance  See #2  6. Tobacco abuse  1/2 PPD  Encouraged abstinence again

## 2020-11-11 DIAGNOSIS — I120 Hypertensive chronic kidney disease with stage 5 chronic kidney disease or end stage renal disease: Secondary | ICD-10-CM | POA: Diagnosis not present

## 2020-11-11 DIAGNOSIS — J449 Chronic obstructive pulmonary disease, unspecified: Secondary | ICD-10-CM | POA: Diagnosis not present

## 2020-11-11 DIAGNOSIS — E7849 Other hyperlipidemia: Secondary | ICD-10-CM | POA: Diagnosis not present

## 2020-11-11 DIAGNOSIS — Z992 Dependence on renal dialysis: Secondary | ICD-10-CM | POA: Diagnosis not present

## 2020-11-11 DIAGNOSIS — N2581 Secondary hyperparathyroidism of renal origin: Secondary | ICD-10-CM | POA: Diagnosis not present

## 2020-11-11 DIAGNOSIS — E1122 Type 2 diabetes mellitus with diabetic chronic kidney disease: Secondary | ICD-10-CM | POA: Diagnosis not present

## 2020-11-11 DIAGNOSIS — D631 Anemia in chronic kidney disease: Secondary | ICD-10-CM | POA: Diagnosis not present

## 2020-11-11 DIAGNOSIS — E559 Vitamin D deficiency, unspecified: Secondary | ICD-10-CM | POA: Diagnosis not present

## 2020-11-11 DIAGNOSIS — D509 Iron deficiency anemia, unspecified: Secondary | ICD-10-CM | POA: Diagnosis not present

## 2020-11-11 DIAGNOSIS — Z1331 Encounter for screening for depression: Secondary | ICD-10-CM | POA: Diagnosis not present

## 2020-11-11 DIAGNOSIS — I6522 Occlusion and stenosis of left carotid artery: Secondary | ICD-10-CM | POA: Diagnosis not present

## 2020-11-11 DIAGNOSIS — Z1389 Encounter for screening for other disorder: Secondary | ICD-10-CM | POA: Diagnosis not present

## 2020-11-11 DIAGNOSIS — E1142 Type 2 diabetes mellitus with diabetic polyneuropathy: Secondary | ICD-10-CM | POA: Diagnosis not present

## 2020-11-11 DIAGNOSIS — I10 Essential (primary) hypertension: Secondary | ICD-10-CM | POA: Diagnosis not present

## 2020-11-11 DIAGNOSIS — N186 End stage renal disease: Secondary | ICD-10-CM | POA: Diagnosis not present

## 2020-11-11 DIAGNOSIS — Z48812 Encounter for surgical aftercare following surgery on the circulatory system: Secondary | ICD-10-CM | POA: Diagnosis not present

## 2020-11-12 DIAGNOSIS — D631 Anemia in chronic kidney disease: Secondary | ICD-10-CM | POA: Diagnosis not present

## 2020-11-12 DIAGNOSIS — E559 Vitamin D deficiency, unspecified: Secondary | ICD-10-CM | POA: Diagnosis not present

## 2020-11-12 DIAGNOSIS — Z992 Dependence on renal dialysis: Secondary | ICD-10-CM | POA: Diagnosis not present

## 2020-11-12 DIAGNOSIS — N2581 Secondary hyperparathyroidism of renal origin: Secondary | ICD-10-CM | POA: Diagnosis not present

## 2020-11-12 DIAGNOSIS — D509 Iron deficiency anemia, unspecified: Secondary | ICD-10-CM | POA: Diagnosis not present

## 2020-11-12 DIAGNOSIS — N186 End stage renal disease: Secondary | ICD-10-CM | POA: Diagnosis not present

## 2020-11-15 DIAGNOSIS — N2581 Secondary hyperparathyroidism of renal origin: Secondary | ICD-10-CM | POA: Diagnosis not present

## 2020-11-15 DIAGNOSIS — D631 Anemia in chronic kidney disease: Secondary | ICD-10-CM | POA: Diagnosis not present

## 2020-11-15 DIAGNOSIS — N186 End stage renal disease: Secondary | ICD-10-CM | POA: Diagnosis not present

## 2020-11-15 DIAGNOSIS — Z992 Dependence on renal dialysis: Secondary | ICD-10-CM | POA: Diagnosis not present

## 2020-11-15 DIAGNOSIS — D509 Iron deficiency anemia, unspecified: Secondary | ICD-10-CM | POA: Diagnosis not present

## 2020-11-15 DIAGNOSIS — E559 Vitamin D deficiency, unspecified: Secondary | ICD-10-CM | POA: Diagnosis not present

## 2020-11-16 ENCOUNTER — Ambulatory Visit (INDEPENDENT_AMBULATORY_CARE_PROVIDER_SITE_OTHER)
Admission: RE | Admit: 2020-11-16 | Discharge: 2020-11-16 | Disposition: A | Discharge status: Home / Self Care | Payer: Medicare Other | Source: Ambulatory Visit | Attending: Vascular Surgery | Admitting: Vascular Surgery

## 2020-11-16 ENCOUNTER — Other Ambulatory Visit: Payer: Self-pay

## 2020-11-16 ENCOUNTER — Ambulatory Visit (INDEPENDENT_AMBULATORY_CARE_PROVIDER_SITE_OTHER): Payer: Medicare Other | Admitting: Physician Assistant

## 2020-11-16 ENCOUNTER — Ambulatory Visit (HOSPITAL_COMMUNITY)
Admission: RE | Admit: 2020-11-16 | Discharge: 2020-11-16 | Disposition: A | Discharge status: Home / Self Care | Payer: Medicare Other | Source: Ambulatory Visit | Attending: Vascular Surgery | Admitting: Vascular Surgery

## 2020-11-16 VITALS — BP 125/71 | HR 59 | Temp 97.9°F | Resp 20 | Ht 76.0 in | Wt 238.1 lb

## 2020-11-16 DIAGNOSIS — I739 Peripheral vascular disease, unspecified: Secondary | ICD-10-CM | POA: Insufficient documentation

## 2020-11-16 DIAGNOSIS — I6522 Occlusion and stenosis of left carotid artery: Secondary | ICD-10-CM

## 2020-11-16 NOTE — Progress Notes (Signed)
POST OPERATIVE OFFICE NOTE    CC:  F/u for surgery  HPI:  This is a 70 y.o. male who is s/p Left carotid endarterectomy on 10/24/20 by Dr. [name] for asymptomatic left ICA stenosis. He has had no symptoms of TIA amaurosis or stroke.  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 He says he has been doing well since surgery. He denies any new visual changes. He denies any slurred speech, facial drooping, weakness or numbness of his upper or lower extremities. He has a LUE fistula and has had long history of numbness and tingling in his left hand. This is unchanged  He is additionally here for follow up of his peripheral vascular disease. He has history of left femoral endarterectomy and left femoral to below knee popliteal bypass in August 2021. This was done for nonhealing wound in the left foot.  At his last visit in February the foot was essentially healed with one small dark area on 1st toe. He has had a prior right above-knee amputation that has healed well. Today he denies any pain in left leg or any tissue loss. He only really transfers with his left leg. He says he has prosthetic but he has not used this since before his left lower extremity bypass. He has some skin abrasions that come and go on the left leg which he says he tries not to pick at. They usually heal without issues. Otherwise he has no new wounds on left foot or leg. He has been compliant with his Plavix and a statin.  Allergies  Allergen Reactions  . Ace Inhibitors     Unknown reaction, tolerates lisinopril   . Clonidine     Unknown reaction   . Morphine And Related Other (See Comments)    Not effective  . Oxytocin     unknown    Current Outpatient Medications  Medication Sig Dispense Refill  . albuterol (VENTOLIN HFA) 108 (90 Base) MCG/ACT inhaler Inhale 2 puffs into the lungs every 6 (six) hours as needed for wheezing or shortness of breath.     . ALPRAZolam  (XANAX) 0.25 MG tablet Take 0.25 mg by mouth 2 (two) times daily as needed.    . ALPRAZolam (XANAX) 0.5 MG tablet Take 0.5 mg by mouth 2 (two) times daily as needed for anxiety.    . ARTIFICIAL TEAR OP Place 1 drop into both eyes every 6 (six) hours as needed (dry eyes).    Marland Kitchen atorvastatin (LIPITOR) 80 MG tablet Take 1 tablet (80 mg total) by mouth at bedtime. (Patient taking differently: Take 80 mg by mouth daily.) 30 tablet 5  . bismuth subsalicylate (PEPTO BISMOL) 262 MG/15ML suspension Take 30 mLs by mouth every 6 (six) hours as needed for indigestion.    . calcium acetate (PHOSLO) 667 MG capsule Take 4 capsules (2,668 mg total) by mouth 3 (three) times daily with meals. (Patient taking differently: Take 667-1,334 mg by mouth See admin instructions. Take 1334 mg with meals and 667 mg with each snack) 360 capsule 0  . cinacalcet (SENSIPAR) 30 MG tablet Take 1 tablet (30 mg total) by mouth every evening. 60 tablet 0  . clopidogrel (PLAVIX) 75 MG tablet Take 1 tablet (75 mg total) by mouth daily. 30 tablet 0  . cyclobenzaprine (FLEXERIL) 10 MG tablet Take 10 mg by mouth 3 (three) times daily as needed for muscle spasms.    . diphenhydrAMINE (BENADRYL) 25  MG tablet Take 25 mg by mouth daily as needed for itching.    Marland Kitchen GLOBAL EASE INJECT PEN NEEDLES 32G X 4 MM MISC TO BE USED DAILY WITH INSULIN    . ibuprofen (ADVIL) 200 MG tablet Take 400 mg by mouth in the morning and at bedtime.    Marland Kitchen levothyroxine (SYNTHROID) 125 MCG tablet Take 1 tablet (125 mcg total) by mouth daily before breakfast. 30 tablet 0  . lisinopril (ZESTRIL) 20 MG tablet Take 1 tablet by mouth daily.    . multivitamin (RENA-VIT) TABS tablet Take 1 tablet by mouth daily.    . NONFORMULARY OR COMPOUNDED ITEM Ketamine 10%, Baclofen 2%, Cyclobenzaprine 2%, Ketoprofen 10%, Gabapentin 6%, Bupivacaine 1%, Amitryptiline 5%, clonidine 0.2%  Dispense 240 g  Apply 1-2 g to affected area 3-4 times per day 1 each 1  . ondansetron (ZOFRAN-ODT) 4  MG disintegrating tablet     . oxyCODONE (OXY IR/ROXICODONE) 5 MG immediate release tablet Take 1 tablet (5 mg total) by mouth every 4 (four) hours as needed for moderate pain. 15 tablet 0  . polyethylene glycol (MIRALAX / GLYCOLAX) 17 g packet Take 17 g by mouth daily.    . pregabalin (LYRICA) 150 MG capsule Take 1 capsule (150 mg total) by mouth 3 (three) times daily. 90 capsule 0  . prochlorperazine (COMPAZINE) 25 MG suppository Place 1 suppository (25 mg total) rectally every 12 (twelve) hours as needed for nausea. 6 suppository 0  . sevelamer carbonate (RENVELA) 800 MG tablet Take 800-1,600 mg by mouth See admin instructions. Take 1600 mg with each meal and 800 mg with each snack    . TOUJEO SOLOSTAR 300 UNIT/ML SOPN Inject 40-78 Units into the skin at bedtime as needed (if blood sugar is 130 or higher).     Current Facility-Administered Medications  Medication Dose Route Frequency Provider Last Rate Last Admin  . 0.9 %  sodium chloride infusion  250 mL Intravenous PRN [name], MD         ROS:  See HPI  Physical Exam:  Vitals:   11/16/20 1400  BP: 125/71  Pulse: (!) 59  Resp: 20  Temp: 97.9 F (36.6 C)  TempSrc: Temporal  SpO2: 98%  Weight: 238 lb 1.6 oz (108 kg)  Height: 6\' 4"  (1.93 m)    Incision:  Left neck incision is clean, dry and intact. No swelling Extremities:  Left lower extremity is well perfused and warm. Leg is edematous. He does have some superficial abrasions on the left leg and foot but appear to be healing Neuro: alert and oriented. CN intact Abdomen:  Obese, soft, non tender  Non invasive studies:  +-------+-----------+-----------+------------+---------------------------+  ABI/TBIToday's ABIToday's TBIPrevious ABIPrevious TBI          +-------+-----------+-----------+------------+---------------------------+  Right AKA    AKA    AKA     AKA               +-------+-----------+-----------+------------+---------------------------+  Left  1.35    0.63    1.05    Amplitude too low to obtain  +-------+-----------+-----------+------------+---------------------------+   Left lower extremity arterial duplex: Patent left femoral-BK popliteal artery bypass graft with no evidence of stenosis.    Assessment/Plan:  This is a 70 y.o. male who is s/p L CEA by Dr. [name] for asymptomatic stenosis. He remains neurologically intact. Incisions is healing well. His Left lower extremity femoral to below knee popliteal vein bypass is patent without stenosis. His ABIs are stable from prior study. He  is without any lower extremity symptoms. Right AKA well healed - Discussed importance of protecting left foot and frequent foot checks   - Continue Plavix and Statin - he will not need his ABI/ Lower extremity duplex for 6 months - He will come back in 6 weeks with Carotid Duplex   Karoline Caldwell, PA-C [facility] MD:  Laqueta Due

## 2020-11-17 ENCOUNTER — Other Ambulatory Visit: Payer: Self-pay

## 2020-11-17 DIAGNOSIS — D509 Iron deficiency anemia, unspecified: Secondary | ICD-10-CM | POA: Diagnosis not present

## 2020-11-17 DIAGNOSIS — D631 Anemia in chronic kidney disease: Secondary | ICD-10-CM | POA: Diagnosis not present

## 2020-11-17 DIAGNOSIS — I739 Peripheral vascular disease, unspecified: Secondary | ICD-10-CM

## 2020-11-17 DIAGNOSIS — N186 End stage renal disease: Secondary | ICD-10-CM | POA: Diagnosis not present

## 2020-11-17 DIAGNOSIS — N2581 Secondary hyperparathyroidism of renal origin: Secondary | ICD-10-CM | POA: Diagnosis not present

## 2020-11-17 DIAGNOSIS — Z992 Dependence on renal dialysis: Secondary | ICD-10-CM | POA: Diagnosis not present

## 2020-11-17 DIAGNOSIS — E559 Vitamin D deficiency, unspecified: Secondary | ICD-10-CM | POA: Diagnosis not present

## 2020-11-17 DIAGNOSIS — I6522 Occlusion and stenosis of left carotid artery: Secondary | ICD-10-CM

## 2020-11-19 DIAGNOSIS — D509 Iron deficiency anemia, unspecified: Secondary | ICD-10-CM | POA: Diagnosis not present

## 2020-11-19 DIAGNOSIS — D631 Anemia in chronic kidney disease: Secondary | ICD-10-CM | POA: Diagnosis not present

## 2020-11-19 DIAGNOSIS — Z992 Dependence on renal dialysis: Secondary | ICD-10-CM | POA: Diagnosis not present

## 2020-11-19 DIAGNOSIS — N2581 Secondary hyperparathyroidism of renal origin: Secondary | ICD-10-CM | POA: Diagnosis not present

## 2020-11-19 DIAGNOSIS — E559 Vitamin D deficiency, unspecified: Secondary | ICD-10-CM | POA: Diagnosis not present

## 2020-11-19 DIAGNOSIS — N186 End stage renal disease: Secondary | ICD-10-CM | POA: Diagnosis not present

## 2020-11-22 DIAGNOSIS — D509 Iron deficiency anemia, unspecified: Secondary | ICD-10-CM | POA: Diagnosis not present

## 2020-11-22 DIAGNOSIS — E559 Vitamin D deficiency, unspecified: Secondary | ICD-10-CM | POA: Diagnosis not present

## 2020-11-22 DIAGNOSIS — D631 Anemia in chronic kidney disease: Secondary | ICD-10-CM | POA: Diagnosis not present

## 2020-11-22 DIAGNOSIS — Z992 Dependence on renal dialysis: Secondary | ICD-10-CM | POA: Diagnosis not present

## 2020-11-22 DIAGNOSIS — N2581 Secondary hyperparathyroidism of renal origin: Secondary | ICD-10-CM | POA: Diagnosis not present

## 2020-11-22 DIAGNOSIS — N186 End stage renal disease: Secondary | ICD-10-CM | POA: Diagnosis not present

## 2020-11-24 DIAGNOSIS — D631 Anemia in chronic kidney disease: Secondary | ICD-10-CM | POA: Diagnosis not present

## 2020-11-24 DIAGNOSIS — M79674 Pain in right toe(s): Secondary | ICD-10-CM | POA: Diagnosis not present

## 2020-11-24 DIAGNOSIS — E1151 Type 2 diabetes mellitus with diabetic peripheral angiopathy without gangrene: Secondary | ICD-10-CM | POA: Diagnosis not present

## 2020-11-24 DIAGNOSIS — Z992 Dependence on renal dialysis: Secondary | ICD-10-CM | POA: Diagnosis not present

## 2020-11-24 DIAGNOSIS — M79672 Pain in left foot: Secondary | ICD-10-CM | POA: Diagnosis not present

## 2020-11-24 DIAGNOSIS — M79671 Pain in right foot: Secondary | ICD-10-CM | POA: Diagnosis not present

## 2020-11-24 DIAGNOSIS — M79675 Pain in left toe(s): Secondary | ICD-10-CM | POA: Diagnosis not present

## 2020-11-24 DIAGNOSIS — E559 Vitamin D deficiency, unspecified: Secondary | ICD-10-CM | POA: Diagnosis not present

## 2020-11-24 DIAGNOSIS — I739 Peripheral vascular disease, unspecified: Secondary | ICD-10-CM | POA: Diagnosis not present

## 2020-11-24 DIAGNOSIS — N186 End stage renal disease: Secondary | ICD-10-CM | POA: Diagnosis not present

## 2020-11-24 DIAGNOSIS — E1140 Type 2 diabetes mellitus with diabetic neuropathy, unspecified: Secondary | ICD-10-CM | POA: Diagnosis not present

## 2020-11-24 DIAGNOSIS — D509 Iron deficiency anemia, unspecified: Secondary | ICD-10-CM | POA: Diagnosis not present

## 2020-11-24 DIAGNOSIS — N2581 Secondary hyperparathyroidism of renal origin: Secondary | ICD-10-CM | POA: Diagnosis not present

## 2020-11-26 DIAGNOSIS — N2581 Secondary hyperparathyroidism of renal origin: Secondary | ICD-10-CM | POA: Diagnosis not present

## 2020-11-26 DIAGNOSIS — D509 Iron deficiency anemia, unspecified: Secondary | ICD-10-CM | POA: Diagnosis not present

## 2020-11-26 DIAGNOSIS — N186 End stage renal disease: Secondary | ICD-10-CM | POA: Diagnosis not present

## 2020-11-26 DIAGNOSIS — Z992 Dependence on renal dialysis: Secondary | ICD-10-CM | POA: Diagnosis not present

## 2020-11-26 DIAGNOSIS — D631 Anemia in chronic kidney disease: Secondary | ICD-10-CM | POA: Diagnosis not present

## 2020-11-26 DIAGNOSIS — E559 Vitamin D deficiency, unspecified: Secondary | ICD-10-CM | POA: Diagnosis not present

## 2020-11-29 DIAGNOSIS — E559 Vitamin D deficiency, unspecified: Secondary | ICD-10-CM | POA: Diagnosis not present

## 2020-11-29 DIAGNOSIS — D631 Anemia in chronic kidney disease: Secondary | ICD-10-CM | POA: Diagnosis not present

## 2020-11-29 DIAGNOSIS — Z992 Dependence on renal dialysis: Secondary | ICD-10-CM | POA: Diagnosis not present

## 2020-11-29 DIAGNOSIS — N2581 Secondary hyperparathyroidism of renal origin: Secondary | ICD-10-CM | POA: Diagnosis not present

## 2020-11-29 DIAGNOSIS — D509 Iron deficiency anemia, unspecified: Secondary | ICD-10-CM | POA: Diagnosis not present

## 2020-11-29 DIAGNOSIS — N186 End stage renal disease: Secondary | ICD-10-CM | POA: Diagnosis not present

## 2020-11-30 DIAGNOSIS — D631 Anemia in chronic kidney disease: Secondary | ICD-10-CM | POA: Diagnosis not present

## 2020-11-30 DIAGNOSIS — N186 End stage renal disease: Secondary | ICD-10-CM | POA: Diagnosis not present

## 2020-11-30 DIAGNOSIS — E559 Vitamin D deficiency, unspecified: Secondary | ICD-10-CM | POA: Diagnosis not present

## 2020-11-30 DIAGNOSIS — N2581 Secondary hyperparathyroidism of renal origin: Secondary | ICD-10-CM | POA: Diagnosis not present

## 2020-11-30 DIAGNOSIS — Z992 Dependence on renal dialysis: Secondary | ICD-10-CM | POA: Diagnosis not present

## 2020-11-30 DIAGNOSIS — D509 Iron deficiency anemia, unspecified: Secondary | ICD-10-CM | POA: Diagnosis not present

## 2020-12-01 DIAGNOSIS — E559 Vitamin D deficiency, unspecified: Secondary | ICD-10-CM | POA: Diagnosis not present

## 2020-12-01 DIAGNOSIS — N186 End stage renal disease: Secondary | ICD-10-CM | POA: Diagnosis not present

## 2020-12-01 DIAGNOSIS — N2581 Secondary hyperparathyroidism of renal origin: Secondary | ICD-10-CM | POA: Diagnosis not present

## 2020-12-01 DIAGNOSIS — Z992 Dependence on renal dialysis: Secondary | ICD-10-CM | POA: Diagnosis not present

## 2020-12-01 DIAGNOSIS — D631 Anemia in chronic kidney disease: Secondary | ICD-10-CM | POA: Diagnosis not present

## 2020-12-01 DIAGNOSIS — D509 Iron deficiency anemia, unspecified: Secondary | ICD-10-CM | POA: Diagnosis not present

## 2020-12-03 DIAGNOSIS — N2581 Secondary hyperparathyroidism of renal origin: Secondary | ICD-10-CM | POA: Diagnosis not present

## 2020-12-03 DIAGNOSIS — N186 End stage renal disease: Secondary | ICD-10-CM | POA: Diagnosis not present

## 2020-12-03 DIAGNOSIS — E559 Vitamin D deficiency, unspecified: Secondary | ICD-10-CM | POA: Diagnosis not present

## 2020-12-03 DIAGNOSIS — Z992 Dependence on renal dialysis: Secondary | ICD-10-CM | POA: Diagnosis not present

## 2020-12-03 DIAGNOSIS — D509 Iron deficiency anemia, unspecified: Secondary | ICD-10-CM | POA: Diagnosis not present

## 2020-12-03 DIAGNOSIS — D631 Anemia in chronic kidney disease: Secondary | ICD-10-CM | POA: Diagnosis not present

## 2020-12-06 DIAGNOSIS — Z992 Dependence on renal dialysis: Secondary | ICD-10-CM | POA: Diagnosis not present

## 2020-12-06 DIAGNOSIS — D631 Anemia in chronic kidney disease: Secondary | ICD-10-CM | POA: Diagnosis not present

## 2020-12-06 DIAGNOSIS — N2581 Secondary hyperparathyroidism of renal origin: Secondary | ICD-10-CM | POA: Diagnosis not present

## 2020-12-06 DIAGNOSIS — D509 Iron deficiency anemia, unspecified: Secondary | ICD-10-CM | POA: Diagnosis not present

## 2020-12-06 DIAGNOSIS — N186 End stage renal disease: Secondary | ICD-10-CM | POA: Diagnosis not present

## 2020-12-06 DIAGNOSIS — E559 Vitamin D deficiency, unspecified: Secondary | ICD-10-CM | POA: Diagnosis not present

## 2020-12-08 DIAGNOSIS — N186 End stage renal disease: Secondary | ICD-10-CM | POA: Diagnosis not present

## 2020-12-08 DIAGNOSIS — Z992 Dependence on renal dialysis: Secondary | ICD-10-CM | POA: Diagnosis not present

## 2020-12-08 DIAGNOSIS — N2581 Secondary hyperparathyroidism of renal origin: Secondary | ICD-10-CM | POA: Diagnosis not present

## 2020-12-08 DIAGNOSIS — E559 Vitamin D deficiency, unspecified: Secondary | ICD-10-CM | POA: Diagnosis not present

## 2020-12-08 DIAGNOSIS — D631 Anemia in chronic kidney disease: Secondary | ICD-10-CM | POA: Diagnosis not present

## 2020-12-08 DIAGNOSIS — D509 Iron deficiency anemia, unspecified: Secondary | ICD-10-CM | POA: Diagnosis not present

## 2020-12-10 DIAGNOSIS — Z992 Dependence on renal dialysis: Secondary | ICD-10-CM | POA: Diagnosis not present

## 2020-12-10 DIAGNOSIS — N186 End stage renal disease: Secondary | ICD-10-CM | POA: Diagnosis not present

## 2020-12-11 DIAGNOSIS — D631 Anemia in chronic kidney disease: Secondary | ICD-10-CM | POA: Diagnosis not present

## 2020-12-11 DIAGNOSIS — N186 End stage renal disease: Secondary | ICD-10-CM | POA: Diagnosis not present

## 2020-12-11 DIAGNOSIS — D509 Iron deficiency anemia, unspecified: Secondary | ICD-10-CM | POA: Diagnosis not present

## 2020-12-11 DIAGNOSIS — N2581 Secondary hyperparathyroidism of renal origin: Secondary | ICD-10-CM | POA: Diagnosis not present

## 2020-12-11 DIAGNOSIS — Z992 Dependence on renal dialysis: Secondary | ICD-10-CM | POA: Diagnosis not present

## 2020-12-13 DIAGNOSIS — Z992 Dependence on renal dialysis: Secondary | ICD-10-CM | POA: Diagnosis not present

## 2020-12-13 DIAGNOSIS — N186 End stage renal disease: Secondary | ICD-10-CM | POA: Diagnosis not present

## 2020-12-13 DIAGNOSIS — N2581 Secondary hyperparathyroidism of renal origin: Secondary | ICD-10-CM | POA: Diagnosis not present

## 2020-12-13 DIAGNOSIS — D509 Iron deficiency anemia, unspecified: Secondary | ICD-10-CM | POA: Diagnosis not present

## 2020-12-13 DIAGNOSIS — D631 Anemia in chronic kidney disease: Secondary | ICD-10-CM | POA: Diagnosis not present

## 2020-12-15 DIAGNOSIS — D509 Iron deficiency anemia, unspecified: Secondary | ICD-10-CM | POA: Diagnosis not present

## 2020-12-15 DIAGNOSIS — N186 End stage renal disease: Secondary | ICD-10-CM | POA: Diagnosis not present

## 2020-12-15 DIAGNOSIS — Z992 Dependence on renal dialysis: Secondary | ICD-10-CM | POA: Diagnosis not present

## 2020-12-15 DIAGNOSIS — N2581 Secondary hyperparathyroidism of renal origin: Secondary | ICD-10-CM | POA: Diagnosis not present

## 2020-12-15 DIAGNOSIS — D631 Anemia in chronic kidney disease: Secondary | ICD-10-CM | POA: Diagnosis not present

## 2020-12-17 DIAGNOSIS — D631 Anemia in chronic kidney disease: Secondary | ICD-10-CM | POA: Diagnosis not present

## 2020-12-17 DIAGNOSIS — Z992 Dependence on renal dialysis: Secondary | ICD-10-CM | POA: Diagnosis not present

## 2020-12-17 DIAGNOSIS — D509 Iron deficiency anemia, unspecified: Secondary | ICD-10-CM | POA: Diagnosis not present

## 2020-12-17 DIAGNOSIS — N186 End stage renal disease: Secondary | ICD-10-CM | POA: Diagnosis not present

## 2020-12-17 DIAGNOSIS — N2581 Secondary hyperparathyroidism of renal origin: Secondary | ICD-10-CM | POA: Diagnosis not present

## 2020-12-20 DIAGNOSIS — D631 Anemia in chronic kidney disease: Secondary | ICD-10-CM | POA: Diagnosis not present

## 2020-12-20 DIAGNOSIS — Z992 Dependence on renal dialysis: Secondary | ICD-10-CM | POA: Diagnosis not present

## 2020-12-20 DIAGNOSIS — D509 Iron deficiency anemia, unspecified: Secondary | ICD-10-CM | POA: Diagnosis not present

## 2020-12-20 DIAGNOSIS — N2581 Secondary hyperparathyroidism of renal origin: Secondary | ICD-10-CM | POA: Diagnosis not present

## 2020-12-20 DIAGNOSIS — N186 End stage renal disease: Secondary | ICD-10-CM | POA: Diagnosis not present

## 2020-12-22 DIAGNOSIS — D509 Iron deficiency anemia, unspecified: Secondary | ICD-10-CM | POA: Diagnosis not present

## 2020-12-22 DIAGNOSIS — D631 Anemia in chronic kidney disease: Secondary | ICD-10-CM | POA: Diagnosis not present

## 2020-12-22 DIAGNOSIS — N2581 Secondary hyperparathyroidism of renal origin: Secondary | ICD-10-CM | POA: Diagnosis not present

## 2020-12-22 DIAGNOSIS — Z992 Dependence on renal dialysis: Secondary | ICD-10-CM | POA: Diagnosis not present

## 2020-12-22 DIAGNOSIS — N186 End stage renal disease: Secondary | ICD-10-CM | POA: Diagnosis not present

## 2020-12-24 DIAGNOSIS — D509 Iron deficiency anemia, unspecified: Secondary | ICD-10-CM | POA: Diagnosis not present

## 2020-12-24 DIAGNOSIS — D631 Anemia in chronic kidney disease: Secondary | ICD-10-CM | POA: Diagnosis not present

## 2020-12-24 DIAGNOSIS — N2581 Secondary hyperparathyroidism of renal origin: Secondary | ICD-10-CM | POA: Diagnosis not present

## 2020-12-24 DIAGNOSIS — Z992 Dependence on renal dialysis: Secondary | ICD-10-CM | POA: Diagnosis not present

## 2020-12-24 DIAGNOSIS — N186 End stage renal disease: Secondary | ICD-10-CM | POA: Diagnosis not present

## 2020-12-26 ENCOUNTER — Encounter: Payer: Medicare Other | Attending: Physical Medicine & Rehabilitation | Admitting: Physical Medicine & Rehabilitation

## 2020-12-26 DIAGNOSIS — Z720 Tobacco use: Secondary | ICD-10-CM | POA: Insufficient documentation

## 2020-12-26 DIAGNOSIS — G479 Sleep disorder, unspecified: Secondary | ICD-10-CM | POA: Insufficient documentation

## 2020-12-26 DIAGNOSIS — Z89611 Acquired absence of right leg above knee: Secondary | ICD-10-CM | POA: Insufficient documentation

## 2020-12-26 DIAGNOSIS — Z992 Dependence on renal dialysis: Secondary | ICD-10-CM | POA: Insufficient documentation

## 2020-12-26 DIAGNOSIS — N186 End stage renal disease: Secondary | ICD-10-CM | POA: Insufficient documentation

## 2020-12-27 DIAGNOSIS — N186 End stage renal disease: Secondary | ICD-10-CM | POA: Diagnosis not present

## 2020-12-27 DIAGNOSIS — Z992 Dependence on renal dialysis: Secondary | ICD-10-CM | POA: Diagnosis not present

## 2020-12-27 DIAGNOSIS — D509 Iron deficiency anemia, unspecified: Secondary | ICD-10-CM | POA: Diagnosis not present

## 2020-12-27 DIAGNOSIS — D631 Anemia in chronic kidney disease: Secondary | ICD-10-CM | POA: Diagnosis not present

## 2020-12-27 DIAGNOSIS — N2581 Secondary hyperparathyroidism of renal origin: Secondary | ICD-10-CM | POA: Diagnosis not present

## 2020-12-28 ENCOUNTER — Ambulatory Visit (HOSPITAL_COMMUNITY)
Admission: RE | Admit: 2020-12-28 | Discharge: 2020-12-28 | Disposition: A | Discharge status: Home / Self Care | Payer: Medicare Other | Source: Ambulatory Visit | Attending: Vascular Surgery | Admitting: Vascular Surgery

## 2020-12-28 ENCOUNTER — Other Ambulatory Visit: Payer: Self-pay

## 2020-12-28 ENCOUNTER — Encounter: Payer: Self-pay | Admitting: Physician Assistant

## 2020-12-28 ENCOUNTER — Ambulatory Visit (INDEPENDENT_AMBULATORY_CARE_PROVIDER_SITE_OTHER): Payer: Medicare Other | Admitting: Physician Assistant

## 2020-12-28 VITALS — BP 129/70 | HR 60 | Temp 97.9°F | Resp 20 | Ht 76.0 in | Wt 238.5 lb

## 2020-12-28 DIAGNOSIS — Z89511 Acquired absence of right leg below knee: Secondary | ICD-10-CM

## 2020-12-28 DIAGNOSIS — I6522 Occlusion and stenosis of left carotid artery: Secondary | ICD-10-CM

## 2020-12-28 DIAGNOSIS — Z992 Dependence on renal dialysis: Secondary | ICD-10-CM

## 2020-12-28 DIAGNOSIS — I739 Peripheral vascular disease, unspecified: Secondary | ICD-10-CM

## 2020-12-28 DIAGNOSIS — N186 End stage renal disease: Secondary | ICD-10-CM

## 2020-12-28 NOTE — Progress Notes (Signed)
Office Note     CC:  follow up Requesting Provider:  Caryl Bis, MD  HPI: [name] is a 70 y.o. ([birth year]) male who presents for follow up of carotid artery disease as well as PAD. He is s/p Left carotid endarterectomy on 10/24/20 by Dr. [name] for asymptomatic left ICA stenosis. He has had no symptoms of TIA amaurosis or stroke. There was prior concerns about right occipital stroke in the past which affected his right eye. He continues to have some right eye blurriness but this is unchanged. He says he has been doing well since surgery. He denies any new visual changes. He denies any slurred speech, facial drooping, weakness or numbness of his upper or lower extremities.   He has history of left femoral endarterectomy and left femoral to below knee popliteal bypass in August 2021. This was done for nonhealing wound in the left foot. 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  Today he denies any pain in left leg or any tissue loss however he reports significant discomfort in right BKA. He says his bone starts shaking and progresses to where it is uncontrollable and painful. The only resolution he gets is if he lays down in bed or if he takes oxycodone. He says he is out of his oxycodone though. Otherwise takes Lyrica and Flexeril and these do not seem to be helping. He has been reluctant to go to Pain Management because of experiences in past. He says he has prosthetic but he has not used this since before his left lower extremity bypass. He has some skin abrasions on left leg that come and go. They usually heal without issues. Otherwise he has no new wounds on left foot or leg. He has been compliant with his Plavix and a statin.  He is ESRD on HD. He has a LUE fistula and has had long history of numbness and tingling in his left hand. This is unchanged. Fistula is working well  The pt is on a statin for cholesterol management.   The pt is not on a daily aspirin.   Other AC:  Plavix The pt is on ACE for hypertension.   The pt is diabetic.  Tobacco hx: Current  Past Medical History:  Diagnosis Date  . Anemia   . Anxiety   . Arthritis   . CAD (coronary artery disease)    Myoview August 2021 with evidence of large inferior scar but no active ischemia  . COPD (chronic obstructive pulmonary disease) ([place])   . Depression   . ESRD on hemodialysis ([place])   . Essential hypertension   . GERD (gastroesophageal reflux disease)   . H/O hiatal hernia   . Headache(784.0)   . History of kidney stones   . History of pneumonia   . Hypothyroidism   . Neuropathy   . Non-healing wound of amputation stump ([facility])    Right  . Peripheral vascular disease ([place])   . PTSD (post-traumatic stress disorder)   . Type 2 diabetes mellitus ([place])    Type II    Past Surgical History:  Procedure Laterality Date  . A/V FISTULAGRAM Left 01/16/2019   Procedure: A/V FISTULAGRAM;  Surgeon: [name], MD;  Location: [place] [facility];  Service: Cardiovascular;  Laterality: Left;  . ABDOMINAL AORTOGRAM W/LOWER EXTREMITY Bilateral 03/18/2020   Procedure: ABDOMINAL AORTOGRAM W/LOWER EXTREMITY;  Surgeon: [name], MD;  Location: [place] [facility];  Service: Cardiovascular;  Laterality: Bilateral;  . AMPUTATION Right 12/01/2018   Procedure: RIGHT AMPUTATION BELOW KNEE;  Surgeon: [name], MD;  Location: [facility] OR;  Service: Vascular;  Laterality: Right;  . AMPUTATION Right 04/27/2019   Procedure: AMPUTATION BELOW KNEE REVISION;  Surgeon: [name], MD;  Location: [place];  Service: Vascular;  Laterality: Right;  . AMPUTATION Right 04/30/2019   Procedure: AMPUTATION ABOVE KNEE - RIGHT;  Surgeon: Waynetta Sandy, MD;  Location: [place];  Service: Vascular;  Laterality: Right;  . APPLICATION OF WOUND VAC Right 04/27/2019   Procedure: APPLICATION OF WOUND VAC;  Surgeon: [name], MD;  Location: [place];  Service:  Vascular;  Laterality: Right;  . AV FISTULA PLACEMENT  2012      left arm   . AV FISTULA PLACEMENT Left 12/18/2012   Procedure: ARTERIOVENOUS (AV) FISTULA CREATION;  Surgeon: Angelia Mould, MD;  Location: [place];  Service: Vascular;  Laterality: Left;  . COLONOSCOPY  10/26/2011   Procedure: COLONOSCOPY;  Surgeon: Rogene Houston, MD;  [facility];  Service: Endoscopy;  Laterality: N/A;  730  . EMBOLECTOMY Left 12/09/2012   Procedure: EMBOLECTOMY;  Surgeon: Serafina Mitchell, MD;  Location: [facility] CATH LAB;  Service: Cardiovascular;  Laterality: Left;  left arm venous embolization  . ENDARTERECTOMY Left 10/24/2020   Procedure: LEFT CAROTID ENDARTERECTOMY;  Surgeon: [name], MD;  Location: [place];  Service: Vascular;  Laterality: Left;  . ENDARTERECTOMY FEMORAL Left 04/04/2020   Procedure: ENDARTERECTOMY COMMON FEMORAL;  Surgeon: [name], MD;  Location: [facility] OR;  Service: Vascular;  Laterality: Left;  . ESOPHAGOGASTRODUODENOSCOPY (EGD) WITH ESOPHAGEAL DILATION N/A 04/23/2013   Procedure: ESOPHAGOGASTRODUODENOSCOPY (EGD) WITH ESOPHAGEAL DILATION;  Surgeon: Rogene Houston, MD;  [facility];  Service: Endoscopy;  Laterality: N/A;  200-moved to 930   . FEMORAL-POPLITEAL BYPASS GRAFT Left 04/04/2020   Procedure: FEMORAL- BELOW KNEE POPLITEAL BYPASS GRAFT NON REVERSED VEIN;  Surgeon: [name], MD;  Location: [place];  Service: Vascular;  Laterality: Left;  . FISTULA SUPERFICIALIZATION Left 06/18/2013   Procedure: FISTULA SUPERFICIALIZATION & LIGATION BRANCH X 1;  Surgeon: Mal Misty, MD;  Location: [place];  Service: Vascular;  Laterality: Left;  . GROIN DEBRIDEMENT Left 04/29/2020   Procedure: EXPLORATION LEFT GROIN WITH DEBRIDEMENT;  Surgeon: Rosetta Posner, MD;  Location: [place];  Service: Vascular;  Laterality: Left;  . HEMATOMA EVACUATION Right 02/11/2017   Procedure: EVACUATION HEMATOMA RIGHT GROIN, Repair of Right Pseudo-anerysm.;  Surgeon: [name], MD;  Location: [facility] OR;  Service: Vascular;  Laterality: Right;  . INGUINAL HERNIA REPAIR     ,  times   2  . INSERTION OF DIALYSIS CATHETER Left 12/18/2012   Procedure: INSERTION OF DIALYSIS CATHETER;  Surgeon: Angelia Mould, MD;  Location: [place];  Service: Vascular;  Laterality: Left;  . KNEE ARTHROSCOPY  2011   Right Knee  . LOWER EXTREMITY ANGIOGRAPHY N/A 02/11/2017   Procedure: Lower Extremity Angiography;  Surgeon: Lorretta Harp, MD;  Location: [place] [facility];  Service: Cardiovascular;  Laterality: N/A;  . PERIPHERAL ATHRECTOMY  02/11/2017  . PERIPHERAL VASCULAR ATHERECTOMY Left 02/11/2017   Procedure: Peripheral Vascular Atherectomy;  Surgeon: Lorretta Harp, MD;  Location: [place] [facility];  Service: Cardiovascular;  Laterality: Left;  . PERIPHERAL VASCULAR BALLOON ANGIOPLASTY Left 01/16/2019   Procedure: PERIPHERAL VASCULAR BALLOON ANGIOPLASTY;  Surgeon: [name], MD;  Location: [place]  [facility];  Service: Cardiovascular;  Laterality: Left;  central vein  . REVISON OF ARTERIOVENOUS FISTULA Left 08/21/3233   Procedure: PLICATION OF LEFT BRACHIOCEPHALIC ARTERIOVENOUS FISTULA;  Surgeon: Conrad Northglenn, MD;  Location: [place];  Service: Vascular;  Laterality: Left;  . SHUNTOGRAM N/A 12/09/2012   Procedure: fistulogram;  Surgeon: Serafina Mitchell, MD;  Location: [facility] CATH LAB;  Service: Cardiovascular;  Laterality: N/A;  . SHUNTOGRAM Left 06/03/2013   Procedure: Fistulogram;  Surgeon: Serafina Mitchell, MD;  Location: [facility] CATH LAB;  Service: Cardiovascular;  Laterality: Left;  . THROMBECTOMY W/ EMBOLECTOMY Left 12/11/2012   Procedure: THROMBECTOMY ARTERIOVENOUS FISTULA;  Surgeon: Serafina Mitchell, MD;  Location: [place];  Service: Vascular;  Laterality: Left;  . TONSILLECTOMY    . WOUND DEBRIDEMENT Right 02/11/2019   Procedure: DEBRIDEMENT WOUND RIGHT BELOW THE KNEE STUMP;  Surgeon: Serafina Mitchell, MD;  Location: [facility] OR;  Service: Vascular;  Laterality: Right;    Social History    Socioeconomic History  . Marital status: Married    Spouse name: [name]  . Number of children: Not on file  . Years of education: Not on file  . Highest education level: Not on file  Occupational History  . Occupation: retired  Tobacco Use  . Smoking status: Former Smoker    Packs/day: 0.50    Years: 18.00    Pack years: 9.00    Types: Cigarettes    Quit date: 06/27/2020    Years since quitting: 0.5  . Smokeless tobacco: Never Used  Vaping Use  . Vaping Use: Never used  Substance and Sexual Activity  . Alcohol use: No    Alcohol/week: 0.0 standard drinks  . Drug use: Yes    Types: Marijuana    Comment: smokes everyday for pain relief  . Sexual activity: Not on file  Other Topics Concern  . Not on file  Social History Narrative   [name] is a 70 year old retired patient who lives with wife [name], his primary caregiver. He reports he is independent/assist with his care needs    He has transportation to medical appointments   Social Determinants of Health   Financial Resource Strain: Not on file  Food Insecurity: Not on file  Transportation Needs: Not on file  Physical Activity: Not on file  Stress: Not on file  Social Connections: Not on file  Intimate Partner Violence: Not on file    Family History  Problem Relation Age of Onset  . Heart disease Father        Heart Disease before age 60  . Healthy Daughter   . Healthy Daughter     Current Outpatient Medications  Medication Sig Dispense Refill  . albuterol (VENTOLIN HFA) 108 (90 Base) MCG/ACT inhaler Inhale 2 puffs into the lungs every 6 (six) hours as needed for wheezing or shortness of breath.     . ALPRAZolam (XANAX) 0.25 MG tablet Take 0.25 mg by mouth 2 (two) times daily as needed.    . ALPRAZolam (XANAX) 0.5 MG tablet Take 0.5 mg by mouth 2 (two) times daily as needed for anxiety.    . ARTIFICIAL TEAR OP Place 1 drop into both eyes every 6 (six) hours as needed (dry eyes).    Marland Kitchen atorvastatin  (LIPITOR) 80 MG tablet Take 1 tablet (80 mg total) by mouth at bedtime. (Patient taking differently: Take 80 mg by mouth daily.) 30 tablet 5  . bismuth subsalicylate (PEPTO BISMOL) 262 MG/15ML suspension Take 30 mLs  by mouth every 6 (six) hours as needed for indigestion.    . calcium acetate (PHOSLO) 667 MG capsule Take 4 capsules (2,668 mg total) by mouth 3 (three) times daily with meals. (Patient taking differently: Take 667-1,334 mg by mouth See admin instructions. Take 1334 mg with meals and 667 mg with each snack) 360 capsule 0  . cinacalcet (SENSIPAR) 30 MG tablet Take 1 tablet (30 mg total) by mouth every evening. 60 tablet 0  . clopidogrel (PLAVIX) 75 MG tablet Take 1 tablet (75 mg total) by mouth daily. 30 tablet 0  . cyclobenzaprine (FLEXERIL) 10 MG tablet Take 10 mg by mouth 3 (three) times daily as needed for muscle spasms.    . diphenhydrAMINE (BENADRYL) 25 MG tablet Take 25 mg by mouth daily as needed for itching.    Marland Kitchen GLOBAL EASE INJECT PEN NEEDLES 32G X 4 MM MISC TO BE USED DAILY WITH INSULIN    . ibuprofen (ADVIL) 200 MG tablet Take 400 mg by mouth in the morning and at bedtime.    Marland Kitchen levothyroxine (SYNTHROID) 125 MCG tablet Take 1 tablet (125 mcg total) by mouth daily before breakfast. 30 tablet 0  . lisinopril (ZESTRIL) 20 MG tablet Take 1 tablet by mouth daily.    . multivitamin (RENA-VIT) TABS tablet Take 1 tablet by mouth daily.    . NONFORMULARY OR COMPOUNDED ITEM Ketamine 10%, Baclofen 2%, Cyclobenzaprine 2%, Ketoprofen 10%, Gabapentin 6%, Bupivacaine 1%, Amitryptiline 5%, clonidine 0.2%  Dispense 240 g  Apply 1-2 g to affected area 3-4 times per day 1 each 1  . ondansetron (ZOFRAN-ODT) 4 MG disintegrating tablet     . oxyCODONE (OXY IR/ROXICODONE) 5 MG immediate release tablet Take 1 tablet (5 mg total) by mouth every 4 (four) hours as needed for moderate pain. 15 tablet 0  . polyethylene glycol (MIRALAX / GLYCOLAX) 17 g packet Take 17 g by mouth daily.    . pregabalin  (LYRICA) 150 MG capsule Take 1 capsule (150 mg total) by mouth 3 (three) times daily. 90 capsule 0  . prochlorperazine (COMPAZINE) 25 MG suppository Place 1 suppository (25 mg total) rectally every 12 (twelve) hours as needed for nausea. 6 suppository 0  . sevelamer carbonate (RENVELA) 800 MG tablet Take 800-1,600 mg by mouth See admin instructions. Take 1600 mg with each meal and 800 mg with each snack    . TOUJEO SOLOSTAR 300 UNIT/ML SOPN Inject 40-78 Units into the skin at bedtime as needed (if blood sugar is 130 or higher).     Current Facility-Administered Medications  Medication Dose Route Frequency Provider Last Rate Last Admin  . 0.9 %  sodium chloride infusion  250 mL Intravenous PRN [name], MD        Allergies  Allergen Reactions  . Ace Inhibitors     Unknown reaction, tolerates lisinopril   . Clonidine     Unknown reaction   . Morphine And Related Other (See Comments)    Not effective  . Oxytocin     unknown     REVIEW OF SYSTEMS:  [X]  denotes positive finding, [ ]  denotes negative finding Cardiac  Comments:  Chest pain or chest pressure:    Shortness of breath upon exertion:    Short of breath when lying flat:    Irregular heart rhythm:        Vascular    Pain in calf, thigh, or hip brought on by ambulation:    Pain in feet at night that wakes  you up from your sleep:     Blood clot in your veins:    Leg swelling:         Pulmonary    Oxygen at home:    Productive cough:     Wheezing:         Neurologic    Sudden weakness in arms or legs:     Sudden numbness in arms or legs:     Sudden onset of difficulty speaking or slurred speech:    Temporary loss of vision in one eye:     Problems with dizziness:         Gastrointestinal    Blood in stool:     Vomited blood:         Genitourinary    Burning when urinating:     Blood in urine:        Psychiatric    Major depression:         Hematologic    Bleeding problems:    Problems with blood  clotting too easily:        Skin    Rashes or ulcers:        Constitutional    Fever or chills:      PHYSICAL EXAMINATION:  Vitals:   12/28/20 0922  BP: 129/70  Pulse: 60  Resp: 20  Temp: 97.9 F (36.6 C)  TempSrc: Temporal  SpO2: 99%  Weight: 238 lb 8.6 oz (108.2 kg)  Height: 6\' 4"  (1.93 m)    General:  WDWN in NAD; vital signs documented above Gait: Not observed, in wheelchair HENT: WNL, normocephalic Pulmonary: normal non-labored breathing , without  wheezing Cardiac: regular HR, without  Murmurs without carotid bruit. Left carotid endarterectomy incision well healed Abdomen: soft, NT, no masses Skin: without rashes Vascular Exam/Pulses: Left lower extremity is well perfused and warm. Leg is edematous. He does have some superficial abrasions on the left leg and foot but appear to be healing. Right AKA cool to the touch and tender. No erythema. Well healed Neuro: alert and oriented. CN intact   Non-Invasive Vascular Imaging:   Summary:  Right Carotid: Velocities in the right ICA are consistent with a 1-39% stenosis.   Left Carotid: Patent endarterectomy site with velocities consistent with a 1-39% stenosis.   Vertebrals: Bilateral vertebral arteries demonstrate antegrade flow.  Subclavians: Left subclavian artery flow was disturbed. Normal flow hemodynamics were seen in the right subclavian artery.    ASSESSMENT/PLAN:: 70 y.o. male here for follow up for  post op follow up s/p L CEA by Dr. [name] for asymptomatic stenosis on 10/24/20. He remains neurologically intact. Incision healed. Carotid duplex today shows patent right ICA and Left ICA with 1-39% stenosis. bilaterally. His Left lower extremity femoral to below knee popliteal vein bypass is patent clinically. Leg is warm and well perfused. No tissue loss. Right AKA well healed. Cool to touch. Complaining of a lot of pain in right AKA stump although seems more musculoskeletal in nature vs ischemic. Will obtain a  duplex of right leg at next office visit to evaluate perfusion - Discussed importance of protecting left foot and frequent foot checks   - Continue Plavix and Statin - Recommend referral to pain management however patient reluctant at this time so he will discuss this with his PCP - he will follow up in 9 months with Carotid duplex, LLE bypass graft duplex, RLE arterial duplex and ABIs     Karoline Caldwell, PA-C [facility] [phone]  Clinic MD:  Scot Dock

## 2020-12-29 ENCOUNTER — Other Ambulatory Visit: Payer: Self-pay

## 2020-12-29 DIAGNOSIS — N2581 Secondary hyperparathyroidism of renal origin: Secondary | ICD-10-CM | POA: Diagnosis not present

## 2020-12-29 DIAGNOSIS — I6522 Occlusion and stenosis of left carotid artery: Secondary | ICD-10-CM

## 2020-12-29 DIAGNOSIS — D631 Anemia in chronic kidney disease: Secondary | ICD-10-CM | POA: Diagnosis not present

## 2020-12-29 DIAGNOSIS — D509 Iron deficiency anemia, unspecified: Secondary | ICD-10-CM | POA: Diagnosis not present

## 2020-12-29 DIAGNOSIS — N186 End stage renal disease: Secondary | ICD-10-CM | POA: Diagnosis not present

## 2020-12-29 DIAGNOSIS — Z992 Dependence on renal dialysis: Secondary | ICD-10-CM | POA: Diagnosis not present

## 2020-12-29 DIAGNOSIS — I739 Peripheral vascular disease, unspecified: Secondary | ICD-10-CM

## 2020-12-30 DIAGNOSIS — D631 Anemia in chronic kidney disease: Secondary | ICD-10-CM | POA: Diagnosis not present

## 2020-12-30 DIAGNOSIS — Z992 Dependence on renal dialysis: Secondary | ICD-10-CM | POA: Diagnosis not present

## 2020-12-30 DIAGNOSIS — D509 Iron deficiency anemia, unspecified: Secondary | ICD-10-CM | POA: Diagnosis not present

## 2020-12-30 DIAGNOSIS — N186 End stage renal disease: Secondary | ICD-10-CM | POA: Diagnosis not present

## 2020-12-30 DIAGNOSIS — N2581 Secondary hyperparathyroidism of renal origin: Secondary | ICD-10-CM | POA: Diagnosis not present

## 2020-12-31 DIAGNOSIS — N2581 Secondary hyperparathyroidism of renal origin: Secondary | ICD-10-CM | POA: Diagnosis not present

## 2020-12-31 DIAGNOSIS — N186 End stage renal disease: Secondary | ICD-10-CM | POA: Diagnosis not present

## 2020-12-31 DIAGNOSIS — Z992 Dependence on renal dialysis: Secondary | ICD-10-CM | POA: Diagnosis not present

## 2020-12-31 DIAGNOSIS — D631 Anemia in chronic kidney disease: Secondary | ICD-10-CM | POA: Diagnosis not present

## 2020-12-31 DIAGNOSIS — D509 Iron deficiency anemia, unspecified: Secondary | ICD-10-CM | POA: Diagnosis not present

## 2021-01-03 DIAGNOSIS — N186 End stage renal disease: Secondary | ICD-10-CM | POA: Diagnosis not present

## 2021-01-03 DIAGNOSIS — D631 Anemia in chronic kidney disease: Secondary | ICD-10-CM | POA: Diagnosis not present

## 2021-01-03 DIAGNOSIS — N2581 Secondary hyperparathyroidism of renal origin: Secondary | ICD-10-CM | POA: Diagnosis not present

## 2021-01-03 DIAGNOSIS — Z992 Dependence on renal dialysis: Secondary | ICD-10-CM | POA: Diagnosis not present

## 2021-01-03 DIAGNOSIS — D509 Iron deficiency anemia, unspecified: Secondary | ICD-10-CM | POA: Diagnosis not present

## 2021-01-05 DIAGNOSIS — D631 Anemia in chronic kidney disease: Secondary | ICD-10-CM | POA: Diagnosis not present

## 2021-01-05 DIAGNOSIS — N186 End stage renal disease: Secondary | ICD-10-CM | POA: Diagnosis not present

## 2021-01-05 DIAGNOSIS — N2581 Secondary hyperparathyroidism of renal origin: Secondary | ICD-10-CM | POA: Diagnosis not present

## 2021-01-05 DIAGNOSIS — D509 Iron deficiency anemia, unspecified: Secondary | ICD-10-CM | POA: Diagnosis not present

## 2021-01-05 DIAGNOSIS — Z992 Dependence on renal dialysis: Secondary | ICD-10-CM | POA: Diagnosis not present

## 2021-01-07 DIAGNOSIS — D509 Iron deficiency anemia, unspecified: Secondary | ICD-10-CM | POA: Diagnosis not present

## 2021-01-07 DIAGNOSIS — N186 End stage renal disease: Secondary | ICD-10-CM | POA: Diagnosis not present

## 2021-01-07 DIAGNOSIS — Z992 Dependence on renal dialysis: Secondary | ICD-10-CM | POA: Diagnosis not present

## 2021-01-07 DIAGNOSIS — D631 Anemia in chronic kidney disease: Secondary | ICD-10-CM | POA: Diagnosis not present

## 2021-01-07 DIAGNOSIS — N2581 Secondary hyperparathyroidism of renal origin: Secondary | ICD-10-CM | POA: Diagnosis not present

## 2021-01-10 DIAGNOSIS — D509 Iron deficiency anemia, unspecified: Secondary | ICD-10-CM | POA: Diagnosis not present

## 2021-01-10 DIAGNOSIS — N2581 Secondary hyperparathyroidism of renal origin: Secondary | ICD-10-CM | POA: Diagnosis not present

## 2021-01-10 DIAGNOSIS — D631 Anemia in chronic kidney disease: Secondary | ICD-10-CM | POA: Diagnosis not present

## 2021-01-10 DIAGNOSIS — Z992 Dependence on renal dialysis: Secondary | ICD-10-CM | POA: Diagnosis not present

## 2021-01-10 DIAGNOSIS — N186 End stage renal disease: Secondary | ICD-10-CM | POA: Diagnosis not present

## 2021-01-10 NOTE — Progress Notes (Signed)
Cardiology Office Note  Date: 01/11/2021   ID: [name], DOB [birth year], MRN [id number]  PCP:  Caryl Bis, MD  Cardiologist:  Rozann Lesches, MD Electrophysiologist:  None   Chief Complaint  Patient presents with  . Cardiac follow-up    History of Present Illness: [name] is a 70 y.o. male last seen in November 2021.  He is here today with his wife for a follow-up visit.  I reviewed his interval notes from Vascular Surgery.  From a cardiac perspective he does not describe any obvious angina symptoms with current level of activity.  I reviewed his medications which are outlined below and stable from a cardiac perspective.  Today's ECG shows sinus rhythm with prolonged PR interval, leftward axis, and right bundle branch block.  He continues to follow with Dr. Quillian Quince.  Also continues with regular hemodialysis.  Past Medical History:  Diagnosis Date  . Anemia   . Anxiety   . Arthritis   . CAD (coronary artery disease)    Myoview August 2021 with evidence of large inferior scar but no active ischemia  . COPD (chronic obstructive pulmonary disease) ([place])   . Depression   . ESRD on hemodialysis ([place])   . Essential hypertension   . GERD (gastroesophageal reflux disease)   . H/O hiatal hernia   . Headache(784.0)   . History of kidney stones   . History of pneumonia   . Hypothyroidism   . Neuropathy   . Non-healing wound of amputation stump (HCC)    Right  . Peripheral vascular disease ([place])   . PTSD (post-traumatic stress disorder)   . Type 2 diabetes mellitus ([place])    Type II    Past Surgical History:  Procedure Laterality Date  . A/V FISTULAGRAM Left 01/16/2019   Procedure: A/V FISTULAGRAM;  Surgeon: [name], MD;  Location: [facility];  Service: Cardiovascular;  Laterality: Left;  . ABDOMINAL AORTOGRAM W/LOWER EXTREMITY Bilateral 03/18/2020   Procedure: ABDOMINAL AORTOGRAM W/LOWER EXTREMITY;  Surgeon: [name], MD;  Location:  [facility] [facility];  Service: Cardiovascular;  Laterality: Bilateral;  . AMPUTATION Right 12/01/2018   Procedure: RIGHT AMPUTATION BELOW KNEE;  Surgeon: [name], MD;  Location: [facility] OR;  Service: Vascular;  Laterality: Right;  . AMPUTATION Right 04/27/2019   Procedure: AMPUTATION BELOW KNEE REVISION;  Surgeon: [name], MD;  Location: [place];  Service: Vascular;  Laterality: Right;  . AMPUTATION Right 04/30/2019   Procedure: AMPUTATION ABOVE KNEE - RIGHT;  Surgeon: Waynetta Sandy, MD;  Location: [place];  Service: Vascular;  Laterality: Right;  . APPLICATION OF WOUND VAC Right 04/27/2019   Procedure: APPLICATION OF WOUND VAC;  Surgeon: [name], MD;  Location: [place];  Service: Vascular;  Laterality: Right;  . AV FISTULA PLACEMENT  2012      left arm   . AV FISTULA PLACEMENT Left 12/18/2012   Procedure: ARTERIOVENOUS (AV) FISTULA CREATION;  Surgeon: Angelia Mould, MD;  Location: [place];  Service: Vascular;  Laterality: Left;  . COLONOSCOPY  10/26/2011   Procedure: COLONOSCOPY;  Surgeon: Rogene Houston, MD;  [facility];  Service: Endoscopy;  Laterality: N/A;  730  . EMBOLECTOMY Left 12/09/2012   Procedure: EMBOLECTOMY;  Surgeon: Serafina Mitchell, MD;  Location[facility] C[facility] LAB;  Service: Cardiovascular;  Laterality: Left;  left arm venous embolization  . ENDARTERECTOMY Left 10/24/2020   Procedure: LEFT CAROTID ENDARTERECTOMY;  Surgeon: [name],  Jessy Oto, MD;  Location: [place];  Service: Vascular;  Laterality: Left;  . ENDARTERECTOMY FEMORAL Left 04/04/2020   Procedure: ENDARTERECTOMY COMMON FEMORAL;  Surgeon: [name], MD;  Location: [facility] OR;  Service: Vascular;  Laterality: Left;  . ESOPHAGOGASTRODUODENOSCOPY (EGD) WITH ESOPHAGEAL DILATION N/A 04/23/2013   Procedure: ESOPHAGOGASTRODUODENOSCOPY (EGD) WITH ESOPHAGEAL DILATION;  Surgeon: Rogene Houston, MD;  [facility];  Service: Endoscopy;  Laterality: N/A;  200-moved to 930   .  FEMORAL-POPLITEAL BYPASS GRAFT Left 04/04/2020   Procedure: FEMORAL- BELOW KNEE POPLITEAL BYPASS GRAFT NON REVERSED VEIN;  Surgeon: [name], MD;  Location: [place];  Service: Vascular;  Laterality: Left;  . FISTULA SUPERFICIALIZATION Left 06/18/2013   Procedure: FISTULA SUPERFICIALIZATION & LIGATION BRANCH X 1;  Surgeon: Mal Misty, MD;  Location: [place];  Service: Vascular;  Laterality: Left;  . GROIN DEBRIDEMENT Left 04/29/2020   Procedure: EXPLORATION LEFT GROIN WITH DEBRIDEMENT;  Surgeon: Rosetta Posner, MD;  Location: [place];  Service: Vascular;  Laterality: Left;  . HEMATOMA EVACUATION Right 02/11/2017   Procedure: EVACUATION HEMATOMA RIGHT GROIN, Repair of Right Pseudo-anerysm.;  Surgeon: [name], MD;  [facility]: [facility];  Service: Vascular;  Laterality: Right;  . INGUINAL HERNIA REPAIR     ,  times   2  . INSERTION OF DIALYSIS CATHETER Left 12/18/2012   Procedure: INSERTION OF DIALYSIS CATHETER;  Surgeon: Angelia Mould, MD;  Location: [place];  Service: Vascular;  Laterality: Left;  . KNEE ARTHROSCOPY  2011   Right Knee  . LOWER EXTREMITY ANGIOGRAPHY N/A 02/11/2017   Procedure: Lower Extremity Angiography;  Surgeon: Lorretta Harp, MD;  Location: [place] [facility];  Service: Cardiovascular;  Laterality: N/A;  . PERIPHERAL ATHRECTOMY  02/11/2017  . PERIPHERAL VASCULAR ATHERECTOMY Left 02/11/2017   Procedure: Peripheral Vascular Atherectomy;  Surgeon: Lorretta Harp, MD;  Location: [place] [facility];  Service: Cardiovascular;  Laterality: Left;  . PERIPHERAL VASCULAR BALLOON ANGIOPLASTY Left 01/16/2019   Procedure: PERIPHERAL VASCULAR BALLOON ANGIOPLASTY;  Surgeon: [name], MD;  Location: [place] [facility];  Service: Cardiovascular;  Laterality: Left;  central vein  . REVISON OF ARTERIOVENOUS FISTULA Left 6/64/4034   Procedure: PLICATION OF LEFT BRACHIOCEPHALIC ARTERIOVENOUS FISTULA;  Surgeon: Conrad Buckner, MD;  Location: [place];  Service: Vascular;  Laterality:  Left;  . SHUNTOGRAM N/A 12/09/2012   Procedure: fistulogram;  Surgeon: Serafina Mitchell, MD;  Location: [facility] CATH LAB;  Service: Cardiovascular;  Laterality: N/A;  . SHUNTOGRAM Left 06/03/2013   Procedure: Fistulogram;  Surgeon: Serafina Mitchell, MD;  Location: [facility] CATH LAB;  Service: Cardiovascular;  Laterality: Left;  . THROMBECTOMY W/ EMBOLECTOMY Left 12/11/2012   Procedure: THROMBECTOMY ARTERIOVENOUS FISTULA;  Surgeon: Serafina Mitchell, MD;  Location: [place];  Service: Vascular;  Laterality: Left;  . TONSILLECTOMY    . WOUND DEBRIDEMENT Right 02/11/2019   Procedure: DEBRIDEMENT WOUND RIGHT BELOW THE KNEE STUMP;  Surgeon: Serafina Mitchell, MD;  [facility];  Service: Vascular;  Laterality: Right;    Current Outpatient Medications  Medication Sig Dispense Refill  . albuterol (VENTOLIN HFA) 108 (90 Base) MCG/ACT inhaler Inhale 2 puffs into the lungs every 6 (six) hours as needed for wheezing or shortness of breath.     . ALPRAZolam (XANAX) 0.25 MG tablet Take 0.25 mg by mouth 2 (two) times daily as needed.    . ALPRAZolam (XANAX) 0.5 MG tablet Take 0.5 mg by mouth 2 (two) times  daily as needed for anxiety.    . ARTIFICIAL TEAR OP Place 1 drop into both eyes every 6 (six) hours as needed (dry eyes).    Marland Kitchen atorvastatin (LIPITOR) 80 MG tablet Take 1 tablet (80 mg total) by mouth at bedtime. (Patient taking differently: Take 80 mg by mouth daily.) 30 tablet 5  . bismuth subsalicylate (PEPTO BISMOL) 262 MG/15ML suspension Take 30 mLs by mouth every 6 (six) hours as needed for indigestion.    . calcium acetate (PHOSLO) 667 MG capsule Take 4 capsules (2,668 mg total) by mouth 3 (three) times daily with meals. (Patient taking differently: Take 667-1,334 mg by mouth See admin instructions. Take 1334 mg with meals and 667 mg with each snack) 360 capsule 0  . cinacalcet (SENSIPAR) 30 MG tablet Take 1 tablet (30 mg total) by mouth every evening. 60 tablet 0  . clopidogrel (PLAVIX) 75 MG tablet Take 1 tablet (75 mg  total) by mouth daily. 30 tablet 0  . cyclobenzaprine (FLEXERIL) 10 MG tablet Take 10 mg by mouth 3 (three) times daily as needed for muscle spasms.    . diphenhydrAMINE (BENADRYL) 25 MG tablet Take 25 mg by mouth daily as needed for itching.    Marland Kitchen GLOBAL EASE INJECT PEN NEEDLES 32G X 4 MM MISC TO BE USED DAILY WITH INSULIN    . ibuprofen (ADVIL) 200 MG tablet Take 400 mg by mouth in the morning and at bedtime.    Marland Kitchen levothyroxine (SYNTHROID) 125 MCG tablet Take 1 tablet (125 mcg total) by mouth daily before breakfast. 30 tablet 0  . lisinopril (ZESTRIL) 20 MG tablet Take 1 tablet by mouth daily.    . multivitamin (RENA-VIT) TABS tablet Take 1 tablet by mouth daily.    . NONFORMULARY OR COMPOUNDED ITEM Ketamine 10%, Baclofen 2%, Cyclobenzaprine 2%, Ketoprofen 10%, Gabapentin 6%, Bupivacaine 1%, Amitryptiline 5%, clonidine 0.2%  Dispense 240 g  Apply 1-2 g to affected area 3-4 times per day 1 each 1  . ondansetron (ZOFRAN-ODT) 4 MG disintegrating tablet     . oxyCODONE (OXY IR/ROXICODONE) 5 MG immediate release tablet Take 1 tablet (5 mg total) by mouth every 4 (four) hours as needed for moderate pain. 15 tablet 0  . polyethylene glycol (MIRALAX / GLYCOLAX) 17 g packet Take 17 g by mouth daily.    . pregabalin (LYRICA) 150 MG capsule Take 1 capsule (150 mg total) by mouth 3 (three) times daily. 90 capsule 0  . prochlorperazine (COMPAZINE) 25 MG suppository Place 1 suppository (25 mg total) rectally every 12 (twelve) hours as needed for nausea. 6 suppository 0  . sevelamer carbonate (RENVELA) 800 MG tablet Take 800-1,600 mg by mouth See admin instructions. Take 1600 mg with each meal and 800 mg with each snack    . TOUJEO SOLOSTAR 300 UNIT/ML SOPN Inject 40-78 Units into the skin at bedtime as needed (if blood sugar is 130 or higher).     Current Facility-Administered Medications  Medication Dose Route Frequency Provider Last Rate Last Admin  . 0.9 %  sodium chloride infusion  250 mL Intravenous  PRN [name], MD       Allergies:  Ace inhibitors, Clonidine, Morphine and related, and Oxytocin   ROS: No palpitations or syncope.  Physical Exam: VS:  BP (!) 142/78   Pulse 73   Ht 6\' 4"  (1.93 m)   Wt 238 lb (108 kg)   SpO2 98%   BMI 28.97 kg/m , BMI Body mass index is 28.97  kg/m.  Wt Readings from Last 3 Encounters:  01/11/21 238 lb (108 kg)  12/28/20 238 lb 8.6 oz (108.2 kg)  11/16/20 238 lb 1.6 oz (108 kg)    General: Patient appears comfortable at rest. HEENT: Conjunctiva and lids normal, wearing a mask. Neck: Supple, no elevated JVP or carotid bruits, no thyromegaly. Lungs: Clear to auscultation, nonlabored breathing at rest. Cardiac: Regular rate and rhythm, no S3, vascular bruits from left upper extremity AV fistula, no pericardial rub. Extremities: Left arm AV fistula, status post right AKA.  ECG:  An ECG dated 01/06/2020 was personally reviewed today and demonstrated:  Sinus rhythm with right bundle branch block and ventricular bigeminy.  Recent Labwork: 10/20/2020: ALT 24; AST 29 10/25/2020: BUN 40; Creatinine, Ser 8.85; Hemoglobin 8.1; Platelets 127; Potassium 4.2; Sodium 139     Component Value Date/Time   CHOL 60 04/05/2020 0428   TRIG 155 (H) 04/05/2020 0428   HDL 17 (L) 04/05/2020 0428   CHOLHDL 3.5 04/05/2020 0428   VLDL 31 04/05/2020 0428   LDLCALC 12 04/05/2020 0428    Other Studies Reviewed Today:  [facility] Myoview 04/01/2020:  The left ventricular ejection fraction is moderately decreased (30-44%).  Nuclear stress EF: 39%.  No T wave inversion was noted during stress.  There was no ST segment deviation noted during stress.  Defect 1: There is a large defect of severe severity present in the mid inferior and apical inferior location.  Findings consistent with prior myocardial infarction.  This is an intermediate risk study.  Large size, severe severity fixed inferior perfusion defect consistent with infarct/scar or much less  likely artifact related to RBBB. No significant reversible ischemia. LVEF 39% with basal to mid inferior akinesis. This is an intermediate risk study.  Echocardiogram 02/02/2020: 1. Left ventricular ejection fraction, by estimation, is 55 to 60%. The  left ventricle has normal function. The left ventricle has no regional  wall motion abnormalities. The left ventricular internal cavity size was  mildly dilated. There is moderate  left ventricular hypertrophy. Left ventricular diastolic parameters are  consistent with Grade I diastolic dysfunction (impaired relaxation).  2. Right ventricular systolic function is normal. The right ventricular  size is normal. There is normal pulmonary artery systolic pressure. The  estimated right ventricular systolic pressure is 84.1 mmHg.  3. Left atrial size was mildly dilated.  4. The mitral valve is grossly normal, mildly calcified annulus. Trivial  mitral valve regurgitation.  5. The aortic valve is tricuspid. Aortic valve regurgitation is not  visualized. Mild aortic valve stenosis. Aortic valve area, by VTI measures  1.49 cm. Aortic valve mean gradient measures 13.0 mmHg.  6. The inferior vena cava is normal in size with greater than 50%  respiratory variability, suggesting right atrial pressure of 3 mmHg.   Assessment and Plan:  1.  Medically managed CAD based on previous abnormal Myoview, no active angina at this time.  ECG reviewed.  Continue Plavix, Lipitor, and lisinopril.  2.  Mixed hyperlipidemia, tolerating high-dose Lipitor with aggressive LDL control.  3.  Severe PAD, continues to follow with Dr. [name].  4.  ESRD on hemodialysis.  Medication Adjustments/Labs and Tests Ordered: Current medicines are reviewed at length with the patient today.  Concerns regarding medicines are outlined above.   Tests Ordered: Orders Placed This Encounter  Procedures  . EKG 12-Lead    Medication Changes: No orders of the defined types were  placed in this encounter.   Disposition:  Follow up 6 months.  Signed, Satira Sark, MD, [facility] 01/11/2021 10:50 AM    [facility] [address] [place], [place] [address] Phone: [phone]; Fax: [phone]

## 2021-01-11 ENCOUNTER — Ambulatory Visit (INDEPENDENT_AMBULATORY_CARE_PROVIDER_SITE_OTHER): Payer: Medicare Other | Admitting: Cardiology

## 2021-01-11 ENCOUNTER — Encounter: Payer: Self-pay | Admitting: Cardiology

## 2021-01-11 VITALS — BP 142/78 | HR 73 | Ht 76.0 in | Wt 238.0 lb

## 2021-01-11 DIAGNOSIS — D631 Anemia in chronic kidney disease: Secondary | ICD-10-CM | POA: Diagnosis not present

## 2021-01-11 DIAGNOSIS — I6522 Occlusion and stenosis of left carotid artery: Secondary | ICD-10-CM | POA: Diagnosis not present

## 2021-01-11 DIAGNOSIS — D509 Iron deficiency anemia, unspecified: Secondary | ICD-10-CM | POA: Diagnosis not present

## 2021-01-11 DIAGNOSIS — I739 Peripheral vascular disease, unspecified: Secondary | ICD-10-CM

## 2021-01-11 DIAGNOSIS — I25119 Atherosclerotic heart disease of native coronary artery with unspecified angina pectoris: Secondary | ICD-10-CM | POA: Diagnosis not present

## 2021-01-11 DIAGNOSIS — E782 Mixed hyperlipidemia: Secondary | ICD-10-CM | POA: Diagnosis not present

## 2021-01-11 DIAGNOSIS — Z992 Dependence on renal dialysis: Secondary | ICD-10-CM | POA: Diagnosis not present

## 2021-01-11 DIAGNOSIS — N186 End stage renal disease: Secondary | ICD-10-CM | POA: Diagnosis not present

## 2021-01-11 DIAGNOSIS — N2581 Secondary hyperparathyroidism of renal origin: Secondary | ICD-10-CM | POA: Diagnosis not present

## 2021-01-11 NOTE — Patient Instructions (Addendum)

## 2021-01-12 DIAGNOSIS — N2581 Secondary hyperparathyroidism of renal origin: Secondary | ICD-10-CM | POA: Diagnosis not present

## 2021-01-12 DIAGNOSIS — N186 End stage renal disease: Secondary | ICD-10-CM | POA: Diagnosis not present

## 2021-01-12 DIAGNOSIS — D509 Iron deficiency anemia, unspecified: Secondary | ICD-10-CM | POA: Diagnosis not present

## 2021-01-12 DIAGNOSIS — D631 Anemia in chronic kidney disease: Secondary | ICD-10-CM | POA: Diagnosis not present

## 2021-01-12 DIAGNOSIS — Z992 Dependence on renal dialysis: Secondary | ICD-10-CM | POA: Diagnosis not present

## 2021-01-14 DIAGNOSIS — Z992 Dependence on renal dialysis: Secondary | ICD-10-CM | POA: Diagnosis not present

## 2021-01-14 DIAGNOSIS — D509 Iron deficiency anemia, unspecified: Secondary | ICD-10-CM | POA: Diagnosis not present

## 2021-01-14 DIAGNOSIS — D631 Anemia in chronic kidney disease: Secondary | ICD-10-CM | POA: Diagnosis not present

## 2021-01-14 DIAGNOSIS — N186 End stage renal disease: Secondary | ICD-10-CM | POA: Diagnosis not present

## 2021-01-14 DIAGNOSIS — N2581 Secondary hyperparathyroidism of renal origin: Secondary | ICD-10-CM | POA: Diagnosis not present

## 2021-01-17 DIAGNOSIS — N186 End stage renal disease: Secondary | ICD-10-CM | POA: Diagnosis not present

## 2021-01-17 DIAGNOSIS — N2581 Secondary hyperparathyroidism of renal origin: Secondary | ICD-10-CM | POA: Diagnosis not present

## 2021-01-17 DIAGNOSIS — Z992 Dependence on renal dialysis: Secondary | ICD-10-CM | POA: Diagnosis not present

## 2021-01-17 DIAGNOSIS — D631 Anemia in chronic kidney disease: Secondary | ICD-10-CM | POA: Diagnosis not present

## 2021-01-17 DIAGNOSIS — D509 Iron deficiency anemia, unspecified: Secondary | ICD-10-CM | POA: Diagnosis not present

## 2021-01-19 DIAGNOSIS — D509 Iron deficiency anemia, unspecified: Secondary | ICD-10-CM | POA: Diagnosis not present

## 2021-01-19 DIAGNOSIS — D631 Anemia in chronic kidney disease: Secondary | ICD-10-CM | POA: Diagnosis not present

## 2021-01-19 DIAGNOSIS — N186 End stage renal disease: Secondary | ICD-10-CM | POA: Diagnosis not present

## 2021-01-19 DIAGNOSIS — Z992 Dependence on renal dialysis: Secondary | ICD-10-CM | POA: Diagnosis not present

## 2021-01-19 DIAGNOSIS — N2581 Secondary hyperparathyroidism of renal origin: Secondary | ICD-10-CM | POA: Diagnosis not present

## 2021-01-21 DIAGNOSIS — D509 Iron deficiency anemia, unspecified: Secondary | ICD-10-CM | POA: Diagnosis not present

## 2021-01-21 DIAGNOSIS — N2581 Secondary hyperparathyroidism of renal origin: Secondary | ICD-10-CM | POA: Diagnosis not present

## 2021-01-21 DIAGNOSIS — Z992 Dependence on renal dialysis: Secondary | ICD-10-CM | POA: Diagnosis not present

## 2021-01-21 DIAGNOSIS — N186 End stage renal disease: Secondary | ICD-10-CM | POA: Diagnosis not present

## 2021-01-21 DIAGNOSIS — D631 Anemia in chronic kidney disease: Secondary | ICD-10-CM | POA: Diagnosis not present

## 2021-01-24 DIAGNOSIS — N2581 Secondary hyperparathyroidism of renal origin: Secondary | ICD-10-CM | POA: Diagnosis not present

## 2021-01-24 DIAGNOSIS — D631 Anemia in chronic kidney disease: Secondary | ICD-10-CM | POA: Diagnosis not present

## 2021-01-24 DIAGNOSIS — Z992 Dependence on renal dialysis: Secondary | ICD-10-CM | POA: Diagnosis not present

## 2021-01-24 DIAGNOSIS — N186 End stage renal disease: Secondary | ICD-10-CM | POA: Diagnosis not present

## 2021-01-24 DIAGNOSIS — D509 Iron deficiency anemia, unspecified: Secondary | ICD-10-CM | POA: Diagnosis not present

## 2021-01-26 DIAGNOSIS — D509 Iron deficiency anemia, unspecified: Secondary | ICD-10-CM | POA: Diagnosis not present

## 2021-01-26 DIAGNOSIS — N2581 Secondary hyperparathyroidism of renal origin: Secondary | ICD-10-CM | POA: Diagnosis not present

## 2021-01-26 DIAGNOSIS — Z992 Dependence on renal dialysis: Secondary | ICD-10-CM | POA: Diagnosis not present

## 2021-01-26 DIAGNOSIS — N186 End stage renal disease: Secondary | ICD-10-CM | POA: Diagnosis not present

## 2021-01-26 DIAGNOSIS — D631 Anemia in chronic kidney disease: Secondary | ICD-10-CM | POA: Diagnosis not present

## 2021-01-28 DIAGNOSIS — D631 Anemia in chronic kidney disease: Secondary | ICD-10-CM | POA: Diagnosis not present

## 2021-01-28 DIAGNOSIS — Z992 Dependence on renal dialysis: Secondary | ICD-10-CM | POA: Diagnosis not present

## 2021-01-28 DIAGNOSIS — N186 End stage renal disease: Secondary | ICD-10-CM | POA: Diagnosis not present

## 2021-01-28 DIAGNOSIS — N2581 Secondary hyperparathyroidism of renal origin: Secondary | ICD-10-CM | POA: Diagnosis not present

## 2021-01-28 DIAGNOSIS — D509 Iron deficiency anemia, unspecified: Secondary | ICD-10-CM | POA: Diagnosis not present

## 2021-01-29 DIAGNOSIS — D631 Anemia in chronic kidney disease: Secondary | ICD-10-CM | POA: Diagnosis not present

## 2021-01-29 DIAGNOSIS — N186 End stage renal disease: Secondary | ICD-10-CM | POA: Diagnosis not present

## 2021-01-29 DIAGNOSIS — Z992 Dependence on renal dialysis: Secondary | ICD-10-CM | POA: Diagnosis not present

## 2021-01-29 DIAGNOSIS — D509 Iron deficiency anemia, unspecified: Secondary | ICD-10-CM | POA: Diagnosis not present

## 2021-01-29 DIAGNOSIS — N2581 Secondary hyperparathyroidism of renal origin: Secondary | ICD-10-CM | POA: Diagnosis not present

## 2021-01-31 DIAGNOSIS — D631 Anemia in chronic kidney disease: Secondary | ICD-10-CM | POA: Diagnosis not present

## 2021-01-31 DIAGNOSIS — N186 End stage renal disease: Secondary | ICD-10-CM | POA: Diagnosis not present

## 2021-01-31 DIAGNOSIS — N2581 Secondary hyperparathyroidism of renal origin: Secondary | ICD-10-CM | POA: Diagnosis not present

## 2021-01-31 DIAGNOSIS — Z992 Dependence on renal dialysis: Secondary | ICD-10-CM | POA: Diagnosis not present

## 2021-01-31 DIAGNOSIS — D509 Iron deficiency anemia, unspecified: Secondary | ICD-10-CM | POA: Diagnosis not present

## 2021-02-02 DIAGNOSIS — D509 Iron deficiency anemia, unspecified: Secondary | ICD-10-CM | POA: Diagnosis not present

## 2021-02-02 DIAGNOSIS — N186 End stage renal disease: Secondary | ICD-10-CM | POA: Diagnosis not present

## 2021-02-02 DIAGNOSIS — D631 Anemia in chronic kidney disease: Secondary | ICD-10-CM | POA: Diagnosis not present

## 2021-02-02 DIAGNOSIS — Z992 Dependence on renal dialysis: Secondary | ICD-10-CM | POA: Diagnosis not present

## 2021-02-02 DIAGNOSIS — N2581 Secondary hyperparathyroidism of renal origin: Secondary | ICD-10-CM | POA: Diagnosis not present

## 2021-02-04 DIAGNOSIS — D509 Iron deficiency anemia, unspecified: Secondary | ICD-10-CM | POA: Diagnosis not present

## 2021-02-04 DIAGNOSIS — D631 Anemia in chronic kidney disease: Secondary | ICD-10-CM | POA: Diagnosis not present

## 2021-02-04 DIAGNOSIS — N186 End stage renal disease: Secondary | ICD-10-CM | POA: Diagnosis not present

## 2021-02-04 DIAGNOSIS — N2581 Secondary hyperparathyroidism of renal origin: Secondary | ICD-10-CM | POA: Diagnosis not present

## 2021-02-04 DIAGNOSIS — Z992 Dependence on renal dialysis: Secondary | ICD-10-CM | POA: Diagnosis not present

## 2021-02-06 DIAGNOSIS — D649 Anemia, unspecified: Secondary | ICD-10-CM | POA: Diagnosis not present

## 2021-02-06 DIAGNOSIS — E782 Mixed hyperlipidemia: Secondary | ICD-10-CM | POA: Diagnosis not present

## 2021-02-06 DIAGNOSIS — E7849 Other hyperlipidemia: Secondary | ICD-10-CM | POA: Diagnosis not present

## 2021-02-06 DIAGNOSIS — N189 Chronic kidney disease, unspecified: Secondary | ICD-10-CM | POA: Diagnosis not present

## 2021-02-06 DIAGNOSIS — D519 Vitamin B12 deficiency anemia, unspecified: Secondary | ICD-10-CM | POA: Diagnosis not present

## 2021-02-06 DIAGNOSIS — D529 Folate deficiency anemia, unspecified: Secondary | ICD-10-CM | POA: Diagnosis not present

## 2021-02-06 DIAGNOSIS — E1122 Type 2 diabetes mellitus with diabetic chronic kidney disease: Secondary | ICD-10-CM | POA: Diagnosis not present

## 2021-02-07 DIAGNOSIS — D631 Anemia in chronic kidney disease: Secondary | ICD-10-CM | POA: Diagnosis not present

## 2021-02-07 DIAGNOSIS — N2581 Secondary hyperparathyroidism of renal origin: Secondary | ICD-10-CM | POA: Diagnosis not present

## 2021-02-07 DIAGNOSIS — Z794 Long term (current) use of insulin: Secondary | ICD-10-CM | POA: Diagnosis not present

## 2021-02-07 DIAGNOSIS — E119 Type 2 diabetes mellitus without complications: Secondary | ICD-10-CM | POA: Diagnosis not present

## 2021-02-07 DIAGNOSIS — Z992 Dependence on renal dialysis: Secondary | ICD-10-CM | POA: Diagnosis not present

## 2021-02-07 DIAGNOSIS — N186 End stage renal disease: Secondary | ICD-10-CM | POA: Diagnosis not present

## 2021-02-07 DIAGNOSIS — D509 Iron deficiency anemia, unspecified: Secondary | ICD-10-CM | POA: Diagnosis not present

## 2021-02-09 DIAGNOSIS — N2581 Secondary hyperparathyroidism of renal origin: Secondary | ICD-10-CM | POA: Diagnosis not present

## 2021-02-09 DIAGNOSIS — D631 Anemia in chronic kidney disease: Secondary | ICD-10-CM | POA: Diagnosis not present

## 2021-02-09 DIAGNOSIS — Z992 Dependence on renal dialysis: Secondary | ICD-10-CM | POA: Diagnosis not present

## 2021-02-09 DIAGNOSIS — D509 Iron deficiency anemia, unspecified: Secondary | ICD-10-CM | POA: Diagnosis not present

## 2021-02-09 DIAGNOSIS — N186 End stage renal disease: Secondary | ICD-10-CM | POA: Diagnosis not present

## 2021-02-10 DIAGNOSIS — Z992 Dependence on renal dialysis: Secondary | ICD-10-CM | POA: Diagnosis not present

## 2021-02-10 DIAGNOSIS — E1142 Type 2 diabetes mellitus with diabetic polyneuropathy: Secondary | ICD-10-CM | POA: Diagnosis not present

## 2021-02-10 DIAGNOSIS — N250 Renal osteodystrophy: Secondary | ICD-10-CM | POA: Diagnosis not present

## 2021-02-10 DIAGNOSIS — D509 Iron deficiency anemia, unspecified: Secondary | ICD-10-CM | POA: Diagnosis not present

## 2021-02-10 DIAGNOSIS — E1122 Type 2 diabetes mellitus with diabetic chronic kidney disease: Secondary | ICD-10-CM | POA: Diagnosis not present

## 2021-02-10 DIAGNOSIS — D631 Anemia in chronic kidney disease: Secondary | ICD-10-CM | POA: Diagnosis not present

## 2021-02-10 DIAGNOSIS — R4582 Worries: Secondary | ICD-10-CM | POA: Diagnosis not present

## 2021-02-10 DIAGNOSIS — I10 Essential (primary) hypertension: Secondary | ICD-10-CM | POA: Diagnosis not present

## 2021-02-10 DIAGNOSIS — E7849 Other hyperlipidemia: Secondary | ICD-10-CM | POA: Diagnosis not present

## 2021-02-10 DIAGNOSIS — Z23 Encounter for immunization: Secondary | ICD-10-CM | POA: Diagnosis not present

## 2021-02-10 DIAGNOSIS — N2581 Secondary hyperparathyroidism of renal origin: Secondary | ICD-10-CM | POA: Diagnosis not present

## 2021-02-10 DIAGNOSIS — J449 Chronic obstructive pulmonary disease, unspecified: Secondary | ICD-10-CM | POA: Diagnosis not present

## 2021-02-10 DIAGNOSIS — N186 End stage renal disease: Secondary | ICD-10-CM | POA: Diagnosis not present

## 2021-02-10 DIAGNOSIS — E559 Vitamin D deficiency, unspecified: Secondary | ICD-10-CM | POA: Diagnosis not present

## 2021-02-11 DIAGNOSIS — Z992 Dependence on renal dialysis: Secondary | ICD-10-CM | POA: Diagnosis not present

## 2021-02-11 DIAGNOSIS — N186 End stage renal disease: Secondary | ICD-10-CM | POA: Diagnosis not present

## 2021-02-11 DIAGNOSIS — N2581 Secondary hyperparathyroidism of renal origin: Secondary | ICD-10-CM | POA: Diagnosis not present

## 2021-02-11 DIAGNOSIS — D509 Iron deficiency anemia, unspecified: Secondary | ICD-10-CM | POA: Diagnosis not present

## 2021-02-11 DIAGNOSIS — D631 Anemia in chronic kidney disease: Secondary | ICD-10-CM | POA: Diagnosis not present

## 2021-02-11 DIAGNOSIS — N250 Renal osteodystrophy: Secondary | ICD-10-CM | POA: Diagnosis not present

## 2021-02-14 DIAGNOSIS — D509 Iron deficiency anemia, unspecified: Secondary | ICD-10-CM | POA: Diagnosis not present

## 2021-02-14 DIAGNOSIS — N186 End stage renal disease: Secondary | ICD-10-CM | POA: Diagnosis not present

## 2021-02-14 DIAGNOSIS — Z992 Dependence on renal dialysis: Secondary | ICD-10-CM | POA: Diagnosis not present

## 2021-02-14 DIAGNOSIS — N2581 Secondary hyperparathyroidism of renal origin: Secondary | ICD-10-CM | POA: Diagnosis not present

## 2021-02-14 DIAGNOSIS — N250 Renal osteodystrophy: Secondary | ICD-10-CM | POA: Diagnosis not present

## 2021-02-14 DIAGNOSIS — D631 Anemia in chronic kidney disease: Secondary | ICD-10-CM | POA: Diagnosis not present

## 2021-02-16 DIAGNOSIS — Z992 Dependence on renal dialysis: Secondary | ICD-10-CM | POA: Diagnosis not present

## 2021-02-16 DIAGNOSIS — D509 Iron deficiency anemia, unspecified: Secondary | ICD-10-CM | POA: Diagnosis not present

## 2021-02-16 DIAGNOSIS — N2581 Secondary hyperparathyroidism of renal origin: Secondary | ICD-10-CM | POA: Diagnosis not present

## 2021-02-16 DIAGNOSIS — N250 Renal osteodystrophy: Secondary | ICD-10-CM | POA: Diagnosis not present

## 2021-02-16 DIAGNOSIS — D631 Anemia in chronic kidney disease: Secondary | ICD-10-CM | POA: Diagnosis not present

## 2021-02-16 DIAGNOSIS — N186 End stage renal disease: Secondary | ICD-10-CM | POA: Diagnosis not present

## 2021-02-18 DIAGNOSIS — D509 Iron deficiency anemia, unspecified: Secondary | ICD-10-CM | POA: Diagnosis not present

## 2021-02-18 DIAGNOSIS — N2581 Secondary hyperparathyroidism of renal origin: Secondary | ICD-10-CM | POA: Diagnosis not present

## 2021-02-18 DIAGNOSIS — N250 Renal osteodystrophy: Secondary | ICD-10-CM | POA: Diagnosis not present

## 2021-02-18 DIAGNOSIS — N186 End stage renal disease: Secondary | ICD-10-CM | POA: Diagnosis not present

## 2021-02-18 DIAGNOSIS — Z992 Dependence on renal dialysis: Secondary | ICD-10-CM | POA: Diagnosis not present

## 2021-02-18 DIAGNOSIS — D631 Anemia in chronic kidney disease: Secondary | ICD-10-CM | POA: Diagnosis not present

## 2021-02-21 DIAGNOSIS — N186 End stage renal disease: Secondary | ICD-10-CM | POA: Diagnosis not present

## 2021-02-21 DIAGNOSIS — N2581 Secondary hyperparathyroidism of renal origin: Secondary | ICD-10-CM | POA: Diagnosis not present

## 2021-02-21 DIAGNOSIS — D509 Iron deficiency anemia, unspecified: Secondary | ICD-10-CM | POA: Diagnosis not present

## 2021-02-21 DIAGNOSIS — D631 Anemia in chronic kidney disease: Secondary | ICD-10-CM | POA: Diagnosis not present

## 2021-02-21 DIAGNOSIS — N250 Renal osteodystrophy: Secondary | ICD-10-CM | POA: Diagnosis not present

## 2021-02-21 DIAGNOSIS — Z992 Dependence on renal dialysis: Secondary | ICD-10-CM | POA: Diagnosis not present

## 2021-02-23 DIAGNOSIS — Z992 Dependence on renal dialysis: Secondary | ICD-10-CM | POA: Diagnosis not present

## 2021-02-23 DIAGNOSIS — N250 Renal osteodystrophy: Secondary | ICD-10-CM | POA: Diagnosis not present

## 2021-02-23 DIAGNOSIS — N186 End stage renal disease: Secondary | ICD-10-CM | POA: Diagnosis not present

## 2021-02-23 DIAGNOSIS — D631 Anemia in chronic kidney disease: Secondary | ICD-10-CM | POA: Diagnosis not present

## 2021-02-23 DIAGNOSIS — N2581 Secondary hyperparathyroidism of renal origin: Secondary | ICD-10-CM | POA: Diagnosis not present

## 2021-02-23 DIAGNOSIS — D509 Iron deficiency anemia, unspecified: Secondary | ICD-10-CM | POA: Diagnosis not present

## 2021-02-25 DIAGNOSIS — N186 End stage renal disease: Secondary | ICD-10-CM | POA: Diagnosis not present

## 2021-02-25 DIAGNOSIS — D509 Iron deficiency anemia, unspecified: Secondary | ICD-10-CM | POA: Diagnosis not present

## 2021-02-25 DIAGNOSIS — N250 Renal osteodystrophy: Secondary | ICD-10-CM | POA: Diagnosis not present

## 2021-02-25 DIAGNOSIS — D631 Anemia in chronic kidney disease: Secondary | ICD-10-CM | POA: Diagnosis not present

## 2021-02-25 DIAGNOSIS — N2581 Secondary hyperparathyroidism of renal origin: Secondary | ICD-10-CM | POA: Diagnosis not present

## 2021-02-25 DIAGNOSIS — Z992 Dependence on renal dialysis: Secondary | ICD-10-CM | POA: Diagnosis not present

## 2021-02-28 DIAGNOSIS — D631 Anemia in chronic kidney disease: Secondary | ICD-10-CM | POA: Diagnosis not present

## 2021-02-28 DIAGNOSIS — N2581 Secondary hyperparathyroidism of renal origin: Secondary | ICD-10-CM | POA: Diagnosis not present

## 2021-02-28 DIAGNOSIS — N186 End stage renal disease: Secondary | ICD-10-CM | POA: Diagnosis not present

## 2021-02-28 DIAGNOSIS — Z992 Dependence on renal dialysis: Secondary | ICD-10-CM | POA: Diagnosis not present

## 2021-02-28 DIAGNOSIS — N250 Renal osteodystrophy: Secondary | ICD-10-CM | POA: Diagnosis not present

## 2021-02-28 DIAGNOSIS — D509 Iron deficiency anemia, unspecified: Secondary | ICD-10-CM | POA: Diagnosis not present

## 2021-03-02 DIAGNOSIS — N2581 Secondary hyperparathyroidism of renal origin: Secondary | ICD-10-CM | POA: Diagnosis not present

## 2021-03-02 DIAGNOSIS — N186 End stage renal disease: Secondary | ICD-10-CM | POA: Diagnosis not present

## 2021-03-02 DIAGNOSIS — N250 Renal osteodystrophy: Secondary | ICD-10-CM | POA: Diagnosis not present

## 2021-03-02 DIAGNOSIS — D631 Anemia in chronic kidney disease: Secondary | ICD-10-CM | POA: Diagnosis not present

## 2021-03-02 DIAGNOSIS — D509 Iron deficiency anemia, unspecified: Secondary | ICD-10-CM | POA: Diagnosis not present

## 2021-03-02 DIAGNOSIS — Z992 Dependence on renal dialysis: Secondary | ICD-10-CM | POA: Diagnosis not present

## 2021-03-04 DIAGNOSIS — N2581 Secondary hyperparathyroidism of renal origin: Secondary | ICD-10-CM | POA: Diagnosis not present

## 2021-03-04 DIAGNOSIS — Z992 Dependence on renal dialysis: Secondary | ICD-10-CM | POA: Diagnosis not present

## 2021-03-04 DIAGNOSIS — D509 Iron deficiency anemia, unspecified: Secondary | ICD-10-CM | POA: Diagnosis not present

## 2021-03-04 DIAGNOSIS — N250 Renal osteodystrophy: Secondary | ICD-10-CM | POA: Diagnosis not present

## 2021-03-04 DIAGNOSIS — N186 End stage renal disease: Secondary | ICD-10-CM | POA: Diagnosis not present

## 2021-03-04 DIAGNOSIS — D631 Anemia in chronic kidney disease: Secondary | ICD-10-CM | POA: Diagnosis not present

## 2021-03-07 DIAGNOSIS — D509 Iron deficiency anemia, unspecified: Secondary | ICD-10-CM | POA: Diagnosis not present

## 2021-03-07 DIAGNOSIS — N2581 Secondary hyperparathyroidism of renal origin: Secondary | ICD-10-CM | POA: Diagnosis not present

## 2021-03-07 DIAGNOSIS — D631 Anemia in chronic kidney disease: Secondary | ICD-10-CM | POA: Diagnosis not present

## 2021-03-07 DIAGNOSIS — N250 Renal osteodystrophy: Secondary | ICD-10-CM | POA: Diagnosis not present

## 2021-03-07 DIAGNOSIS — Z992 Dependence on renal dialysis: Secondary | ICD-10-CM | POA: Diagnosis not present

## 2021-03-07 DIAGNOSIS — N186 End stage renal disease: Secondary | ICD-10-CM | POA: Diagnosis not present

## 2021-03-09 DIAGNOSIS — D509 Iron deficiency anemia, unspecified: Secondary | ICD-10-CM | POA: Diagnosis not present

## 2021-03-09 DIAGNOSIS — N186 End stage renal disease: Secondary | ICD-10-CM | POA: Diagnosis not present

## 2021-03-09 DIAGNOSIS — D631 Anemia in chronic kidney disease: Secondary | ICD-10-CM | POA: Diagnosis not present

## 2021-03-09 DIAGNOSIS — N250 Renal osteodystrophy: Secondary | ICD-10-CM | POA: Diagnosis not present

## 2021-03-09 DIAGNOSIS — Z992 Dependence on renal dialysis: Secondary | ICD-10-CM | POA: Diagnosis not present

## 2021-03-09 DIAGNOSIS — N2581 Secondary hyperparathyroidism of renal origin: Secondary | ICD-10-CM | POA: Diagnosis not present

## 2021-03-09 DIAGNOSIS — Z20822 Contact with and (suspected) exposure to covid-19: Secondary | ICD-10-CM | POA: Diagnosis not present

## 2021-03-11 DIAGNOSIS — D631 Anemia in chronic kidney disease: Secondary | ICD-10-CM | POA: Diagnosis not present

## 2021-03-11 DIAGNOSIS — N2581 Secondary hyperparathyroidism of renal origin: Secondary | ICD-10-CM | POA: Diagnosis not present

## 2021-03-11 DIAGNOSIS — N186 End stage renal disease: Secondary | ICD-10-CM | POA: Diagnosis not present

## 2021-03-11 DIAGNOSIS — D509 Iron deficiency anemia, unspecified: Secondary | ICD-10-CM | POA: Diagnosis not present

## 2021-03-11 DIAGNOSIS — Z992 Dependence on renal dialysis: Secondary | ICD-10-CM | POA: Diagnosis not present

## 2021-03-11 DIAGNOSIS — N250 Renal osteodystrophy: Secondary | ICD-10-CM | POA: Diagnosis not present

## 2021-03-12 DIAGNOSIS — Z992 Dependence on renal dialysis: Secondary | ICD-10-CM | POA: Diagnosis not present

## 2021-03-12 DIAGNOSIS — N186 End stage renal disease: Secondary | ICD-10-CM | POA: Diagnosis not present

## 2021-03-13 DIAGNOSIS — N186 End stage renal disease: Secondary | ICD-10-CM | POA: Diagnosis not present

## 2021-03-13 DIAGNOSIS — Z992 Dependence on renal dialysis: Secondary | ICD-10-CM | POA: Diagnosis not present

## 2021-03-13 DIAGNOSIS — D509 Iron deficiency anemia, unspecified: Secondary | ICD-10-CM | POA: Diagnosis not present

## 2021-03-13 DIAGNOSIS — D631 Anemia in chronic kidney disease: Secondary | ICD-10-CM | POA: Diagnosis not present

## 2021-03-13 DIAGNOSIS — N2581 Secondary hyperparathyroidism of renal origin: Secondary | ICD-10-CM | POA: Diagnosis not present

## 2021-03-14 DIAGNOSIS — D509 Iron deficiency anemia, unspecified: Secondary | ICD-10-CM | POA: Diagnosis not present

## 2021-03-14 DIAGNOSIS — N186 End stage renal disease: Secondary | ICD-10-CM | POA: Diagnosis not present

## 2021-03-14 DIAGNOSIS — Z992 Dependence on renal dialysis: Secondary | ICD-10-CM | POA: Diagnosis not present

## 2021-03-14 DIAGNOSIS — D631 Anemia in chronic kidney disease: Secondary | ICD-10-CM | POA: Diagnosis not present

## 2021-03-14 DIAGNOSIS — N2581 Secondary hyperparathyroidism of renal origin: Secondary | ICD-10-CM | POA: Diagnosis not present

## 2021-03-15 DIAGNOSIS — Z79891 Long term (current) use of opiate analgesic: Secondary | ICD-10-CM | POA: Diagnosis not present

## 2021-03-15 DIAGNOSIS — M792 Neuralgia and neuritis, unspecified: Secondary | ICD-10-CM | POA: Diagnosis not present

## 2021-03-16 DIAGNOSIS — Z992 Dependence on renal dialysis: Secondary | ICD-10-CM | POA: Diagnosis not present

## 2021-03-16 DIAGNOSIS — N186 End stage renal disease: Secondary | ICD-10-CM | POA: Diagnosis not present

## 2021-03-16 DIAGNOSIS — N2581 Secondary hyperparathyroidism of renal origin: Secondary | ICD-10-CM | POA: Diagnosis not present

## 2021-03-16 DIAGNOSIS — D631 Anemia in chronic kidney disease: Secondary | ICD-10-CM | POA: Diagnosis not present

## 2021-03-16 DIAGNOSIS — D509 Iron deficiency anemia, unspecified: Secondary | ICD-10-CM | POA: Diagnosis not present

## 2021-03-18 DIAGNOSIS — N2581 Secondary hyperparathyroidism of renal origin: Secondary | ICD-10-CM | POA: Diagnosis not present

## 2021-03-18 DIAGNOSIS — N186 End stage renal disease: Secondary | ICD-10-CM | POA: Diagnosis not present

## 2021-03-18 DIAGNOSIS — D631 Anemia in chronic kidney disease: Secondary | ICD-10-CM | POA: Diagnosis not present

## 2021-03-18 DIAGNOSIS — D509 Iron deficiency anemia, unspecified: Secondary | ICD-10-CM | POA: Diagnosis not present

## 2021-03-18 DIAGNOSIS — Z992 Dependence on renal dialysis: Secondary | ICD-10-CM | POA: Diagnosis not present

## 2021-03-21 DIAGNOSIS — D509 Iron deficiency anemia, unspecified: Secondary | ICD-10-CM | POA: Diagnosis not present

## 2021-03-21 DIAGNOSIS — N186 End stage renal disease: Secondary | ICD-10-CM | POA: Diagnosis not present

## 2021-03-21 DIAGNOSIS — Z992 Dependence on renal dialysis: Secondary | ICD-10-CM | POA: Diagnosis not present

## 2021-03-21 DIAGNOSIS — D631 Anemia in chronic kidney disease: Secondary | ICD-10-CM | POA: Diagnosis not present

## 2021-03-21 DIAGNOSIS — N2581 Secondary hyperparathyroidism of renal origin: Secondary | ICD-10-CM | POA: Diagnosis not present

## 2021-03-23 DIAGNOSIS — N2581 Secondary hyperparathyroidism of renal origin: Secondary | ICD-10-CM | POA: Diagnosis not present

## 2021-03-23 DIAGNOSIS — D509 Iron deficiency anemia, unspecified: Secondary | ICD-10-CM | POA: Diagnosis not present

## 2021-03-23 DIAGNOSIS — N186 End stage renal disease: Secondary | ICD-10-CM | POA: Diagnosis not present

## 2021-03-23 DIAGNOSIS — Z992 Dependence on renal dialysis: Secondary | ICD-10-CM | POA: Diagnosis not present

## 2021-03-23 DIAGNOSIS — D631 Anemia in chronic kidney disease: Secondary | ICD-10-CM | POA: Diagnosis not present

## 2021-03-25 DIAGNOSIS — N186 End stage renal disease: Secondary | ICD-10-CM | POA: Diagnosis not present

## 2021-03-25 DIAGNOSIS — Z992 Dependence on renal dialysis: Secondary | ICD-10-CM | POA: Diagnosis not present

## 2021-03-25 DIAGNOSIS — D509 Iron deficiency anemia, unspecified: Secondary | ICD-10-CM | POA: Diagnosis not present

## 2021-03-25 DIAGNOSIS — D631 Anemia in chronic kidney disease: Secondary | ICD-10-CM | POA: Diagnosis not present

## 2021-03-25 DIAGNOSIS — N2581 Secondary hyperparathyroidism of renal origin: Secondary | ICD-10-CM | POA: Diagnosis not present

## 2021-03-28 DIAGNOSIS — N186 End stage renal disease: Secondary | ICD-10-CM | POA: Diagnosis not present

## 2021-03-28 DIAGNOSIS — Z992 Dependence on renal dialysis: Secondary | ICD-10-CM | POA: Diagnosis not present

## 2021-03-28 DIAGNOSIS — D509 Iron deficiency anemia, unspecified: Secondary | ICD-10-CM | POA: Diagnosis not present

## 2021-03-28 DIAGNOSIS — N2581 Secondary hyperparathyroidism of renal origin: Secondary | ICD-10-CM | POA: Diagnosis not present

## 2021-03-28 DIAGNOSIS — D631 Anemia in chronic kidney disease: Secondary | ICD-10-CM | POA: Diagnosis not present

## 2021-03-30 DIAGNOSIS — D509 Iron deficiency anemia, unspecified: Secondary | ICD-10-CM | POA: Diagnosis not present

## 2021-03-30 DIAGNOSIS — Z992 Dependence on renal dialysis: Secondary | ICD-10-CM | POA: Diagnosis not present

## 2021-03-30 DIAGNOSIS — N186 End stage renal disease: Secondary | ICD-10-CM | POA: Diagnosis not present

## 2021-03-30 DIAGNOSIS — D631 Anemia in chronic kidney disease: Secondary | ICD-10-CM | POA: Diagnosis not present

## 2021-03-30 DIAGNOSIS — N2581 Secondary hyperparathyroidism of renal origin: Secondary | ICD-10-CM | POA: Diagnosis not present

## 2021-04-01 DIAGNOSIS — D509 Iron deficiency anemia, unspecified: Secondary | ICD-10-CM | POA: Diagnosis not present

## 2021-04-01 DIAGNOSIS — Z992 Dependence on renal dialysis: Secondary | ICD-10-CM | POA: Diagnosis not present

## 2021-04-01 DIAGNOSIS — N186 End stage renal disease: Secondary | ICD-10-CM | POA: Diagnosis not present

## 2021-04-01 DIAGNOSIS — D631 Anemia in chronic kidney disease: Secondary | ICD-10-CM | POA: Diagnosis not present

## 2021-04-01 DIAGNOSIS — N2581 Secondary hyperparathyroidism of renal origin: Secondary | ICD-10-CM | POA: Diagnosis not present

## 2021-04-04 DIAGNOSIS — N186 End stage renal disease: Secondary | ICD-10-CM | POA: Diagnosis not present

## 2021-04-04 DIAGNOSIS — N2581 Secondary hyperparathyroidism of renal origin: Secondary | ICD-10-CM | POA: Diagnosis not present

## 2021-04-04 DIAGNOSIS — Z992 Dependence on renal dialysis: Secondary | ICD-10-CM | POA: Diagnosis not present

## 2021-04-04 DIAGNOSIS — D509 Iron deficiency anemia, unspecified: Secondary | ICD-10-CM | POA: Diagnosis not present

## 2021-04-04 DIAGNOSIS — D631 Anemia in chronic kidney disease: Secondary | ICD-10-CM | POA: Diagnosis not present

## 2021-04-06 DIAGNOSIS — N186 End stage renal disease: Secondary | ICD-10-CM | POA: Diagnosis not present

## 2021-04-06 DIAGNOSIS — N2581 Secondary hyperparathyroidism of renal origin: Secondary | ICD-10-CM | POA: Diagnosis not present

## 2021-04-06 DIAGNOSIS — D631 Anemia in chronic kidney disease: Secondary | ICD-10-CM | POA: Diagnosis not present

## 2021-04-06 DIAGNOSIS — D509 Iron deficiency anemia, unspecified: Secondary | ICD-10-CM | POA: Diagnosis not present

## 2021-04-06 DIAGNOSIS — Z992 Dependence on renal dialysis: Secondary | ICD-10-CM | POA: Diagnosis not present

## 2021-04-08 DIAGNOSIS — D631 Anemia in chronic kidney disease: Secondary | ICD-10-CM | POA: Diagnosis not present

## 2021-04-08 DIAGNOSIS — N2581 Secondary hyperparathyroidism of renal origin: Secondary | ICD-10-CM | POA: Diagnosis not present

## 2021-04-08 DIAGNOSIS — Z992 Dependence on renal dialysis: Secondary | ICD-10-CM | POA: Diagnosis not present

## 2021-04-08 DIAGNOSIS — N186 End stage renal disease: Secondary | ICD-10-CM | POA: Diagnosis not present

## 2021-04-08 DIAGNOSIS — D509 Iron deficiency anemia, unspecified: Secondary | ICD-10-CM | POA: Diagnosis not present

## 2021-04-09 IMAGING — DX PORTABLE CHEST - 1 VIEW
1 series · 2 of 2 positions shown · non-contrast
Comparison: February 11, 2017.

CLINICAL DATA: Wheezing

EXAM:
PORTABLE CHEST 1 VIEW

[Series 1: chest ap · 0.14mm/px · 2 of 2 slices shown]
[im 1/2]
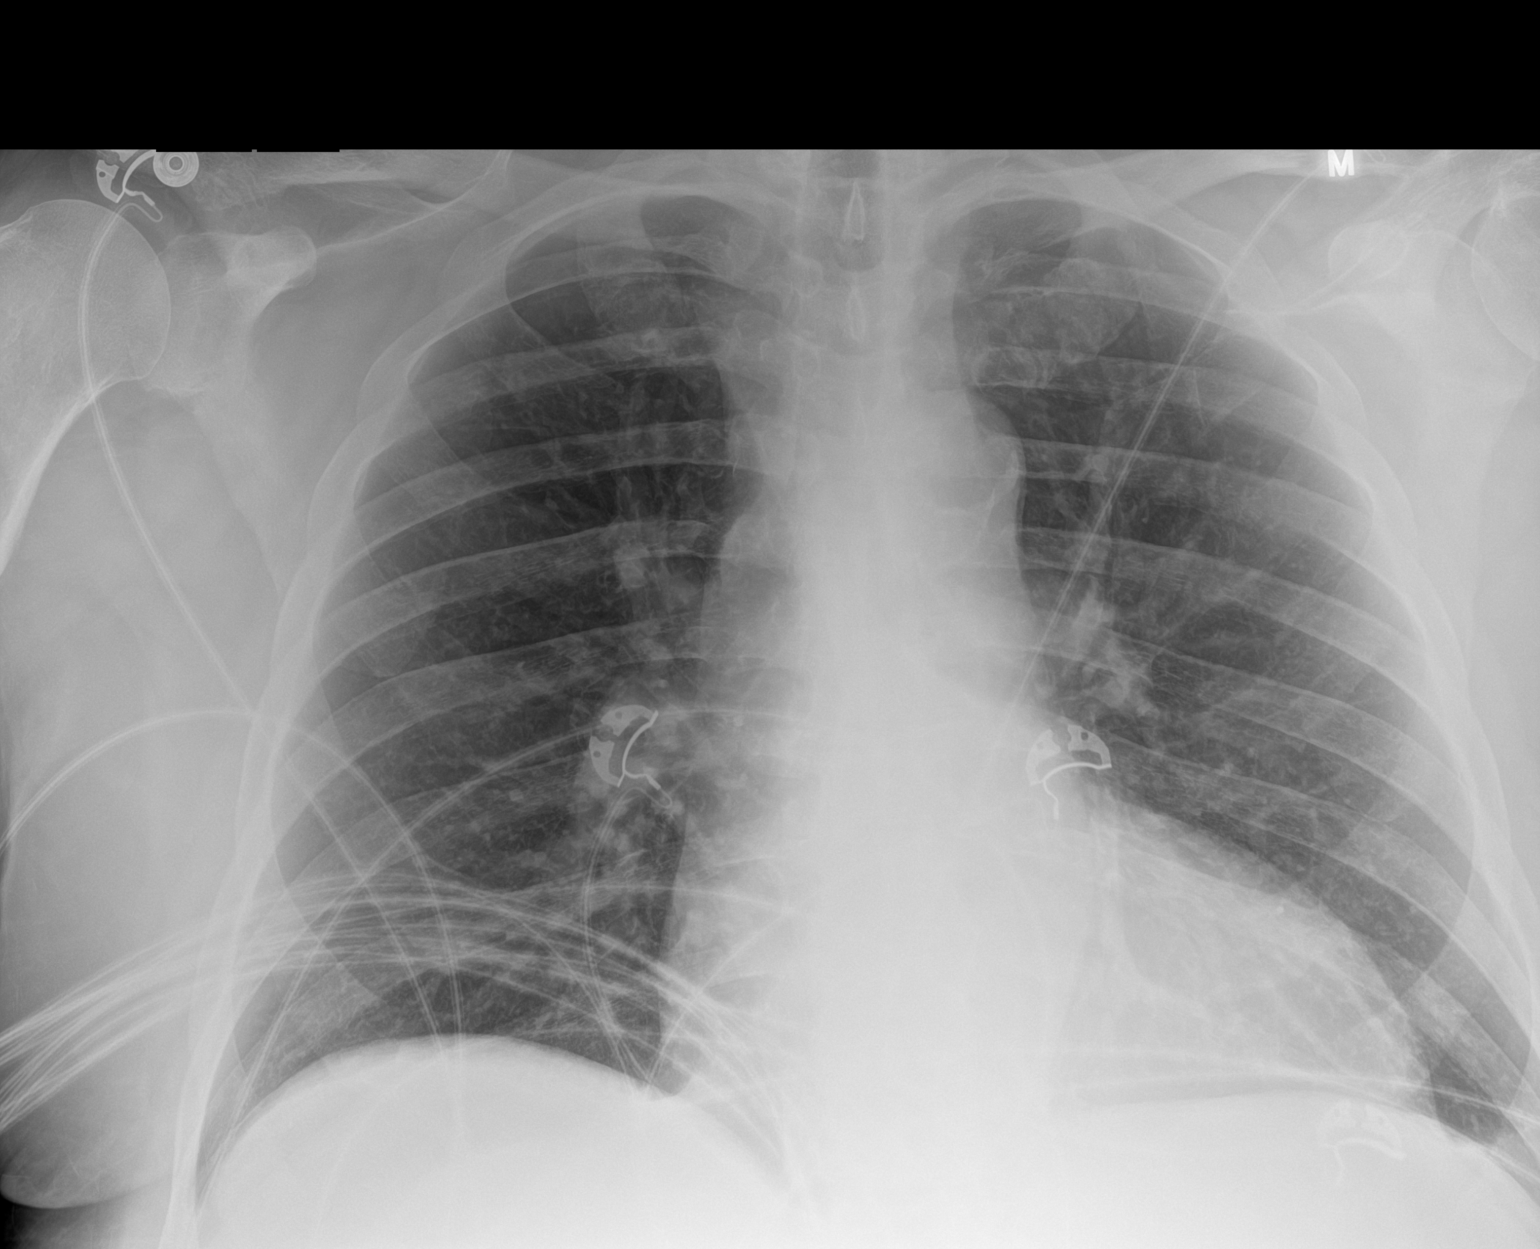
[im 2/2]
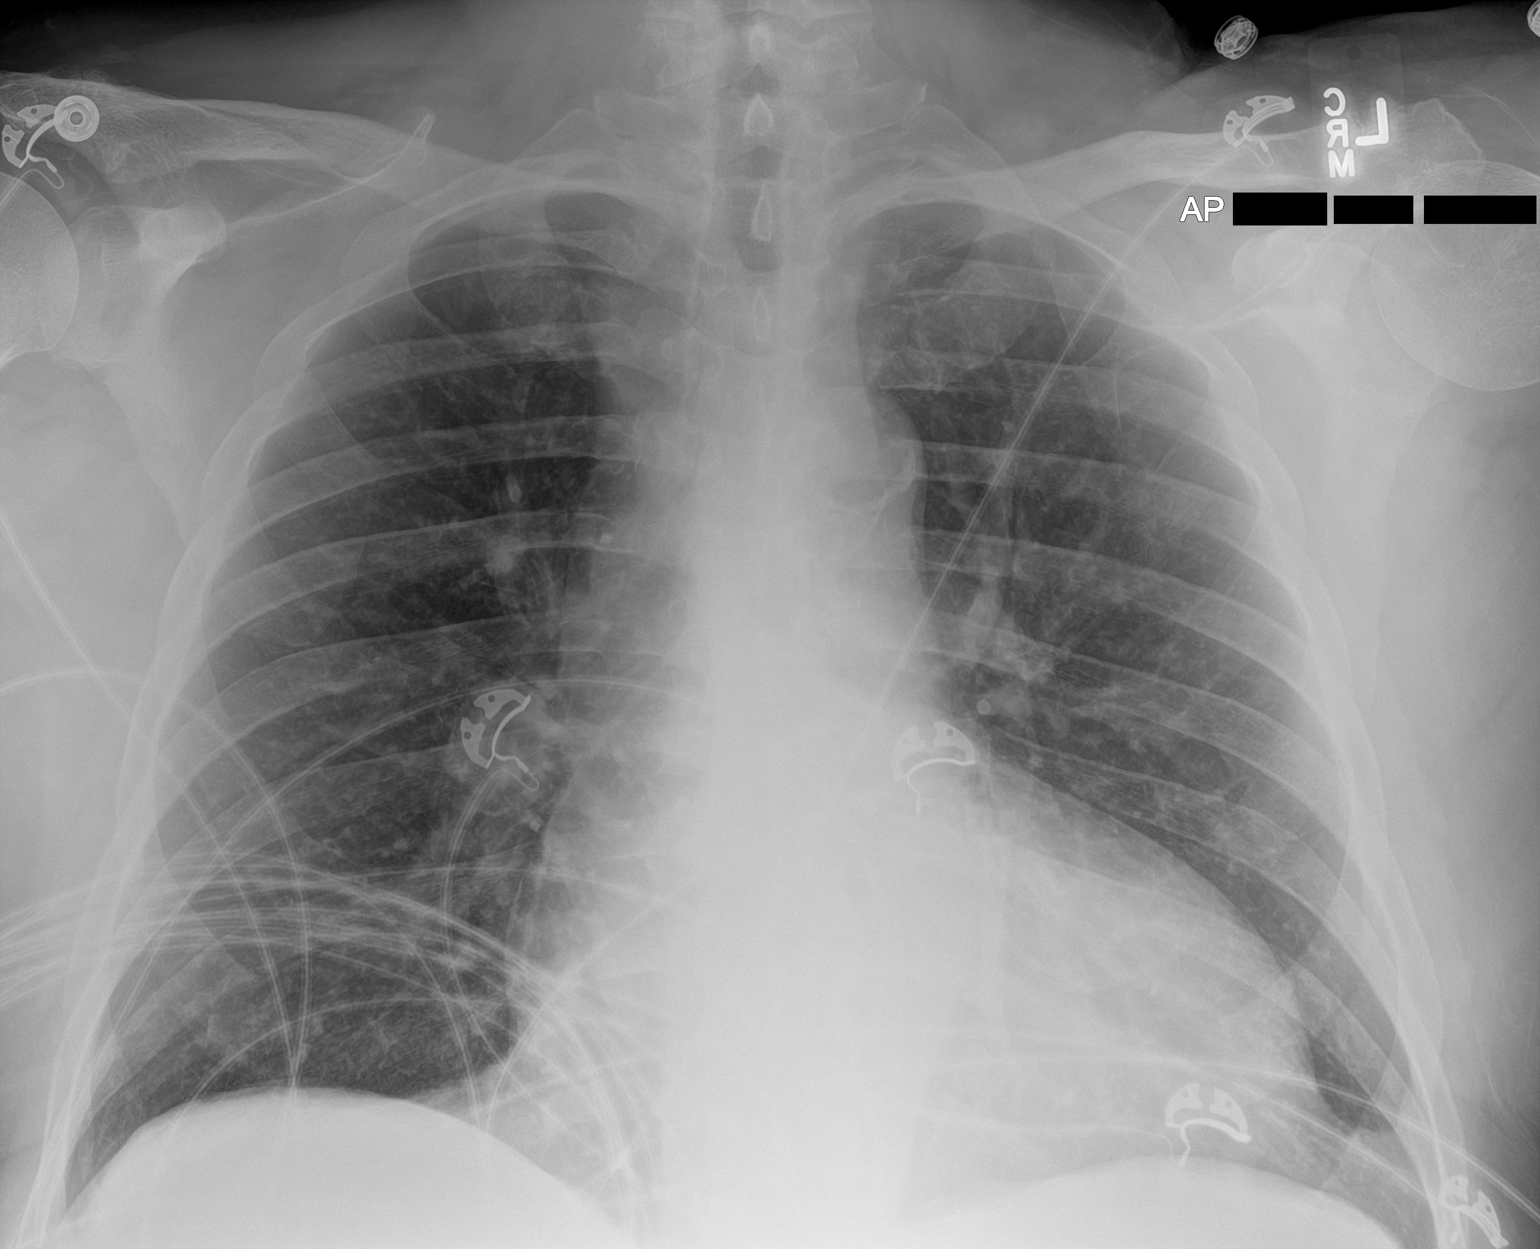

[2 of 2 positions shown; findings below may reference images not displayed]

FINDINGS: There is no edema or consolidation. Heart is mildly enlarged with
pulmonary vascularity normal. No adenopathy. There is aortic
atherosclerosis. No evident bone lesions.
IMPRESSION: Mild cardiac enlargement. No edema or consolidation. Aortic
Atherosclerosis ([id number]).

## 2021-04-09 IMAGING — DX RIGHT FOOT COMPLETE - 3+ VIEW
3 series · 3 of 3 positions shown · non-contrast
Comparison: None.

CLINICAL DATA: 67-year-old male with a history of foot wound

EXAM:
RIGHT FOOT COMPLETE - 3+ VIEW

[foot ap]
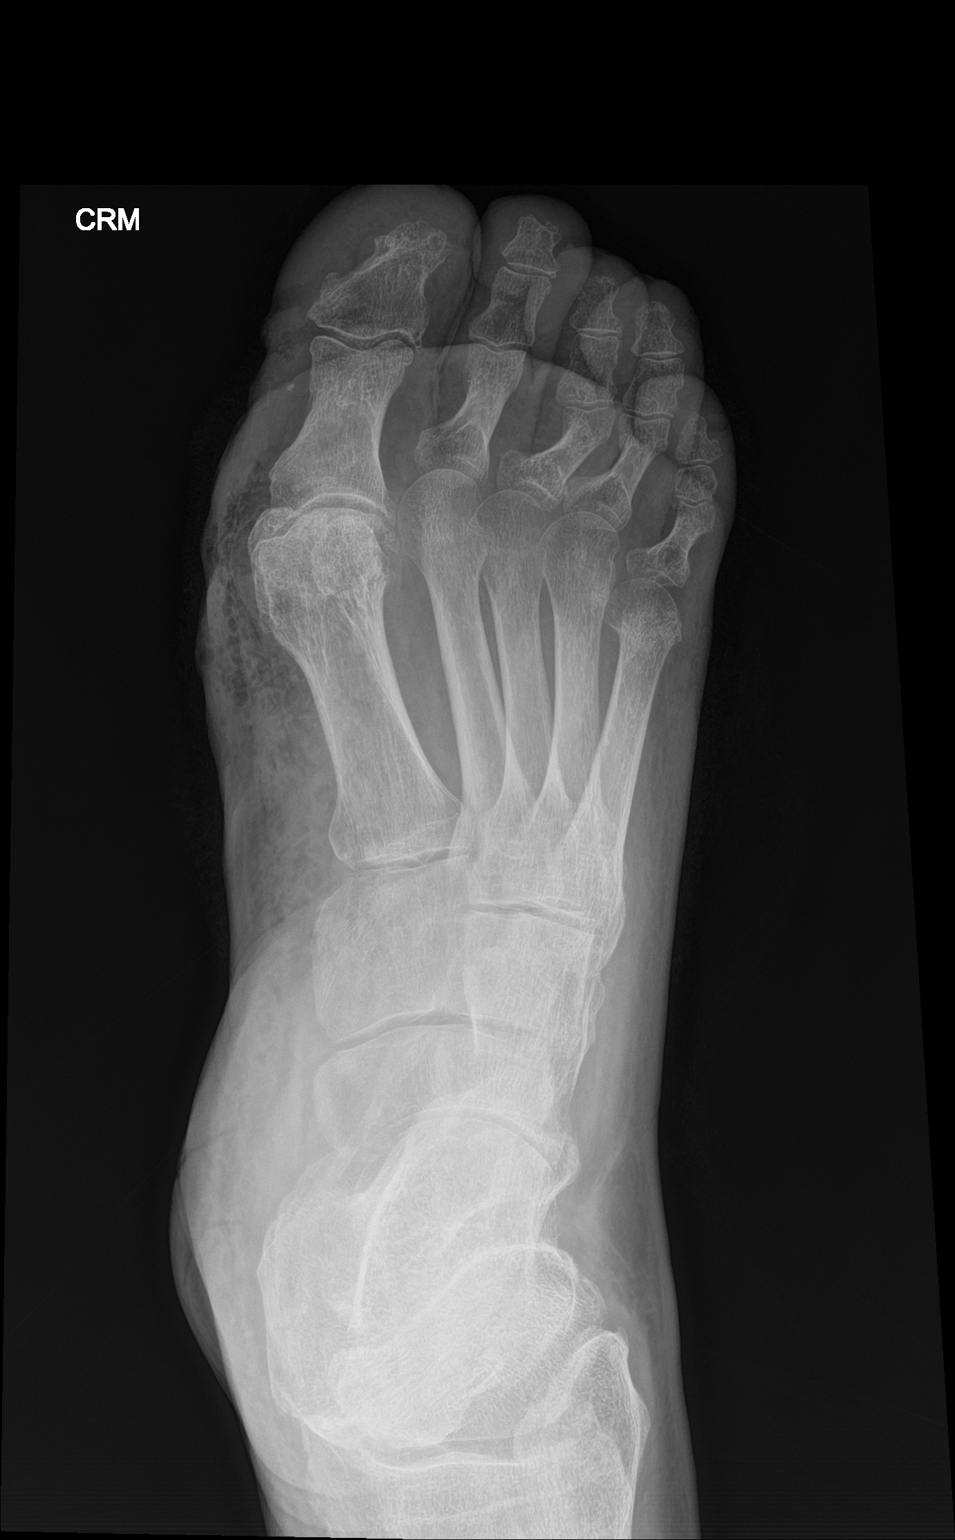

[foot obl]
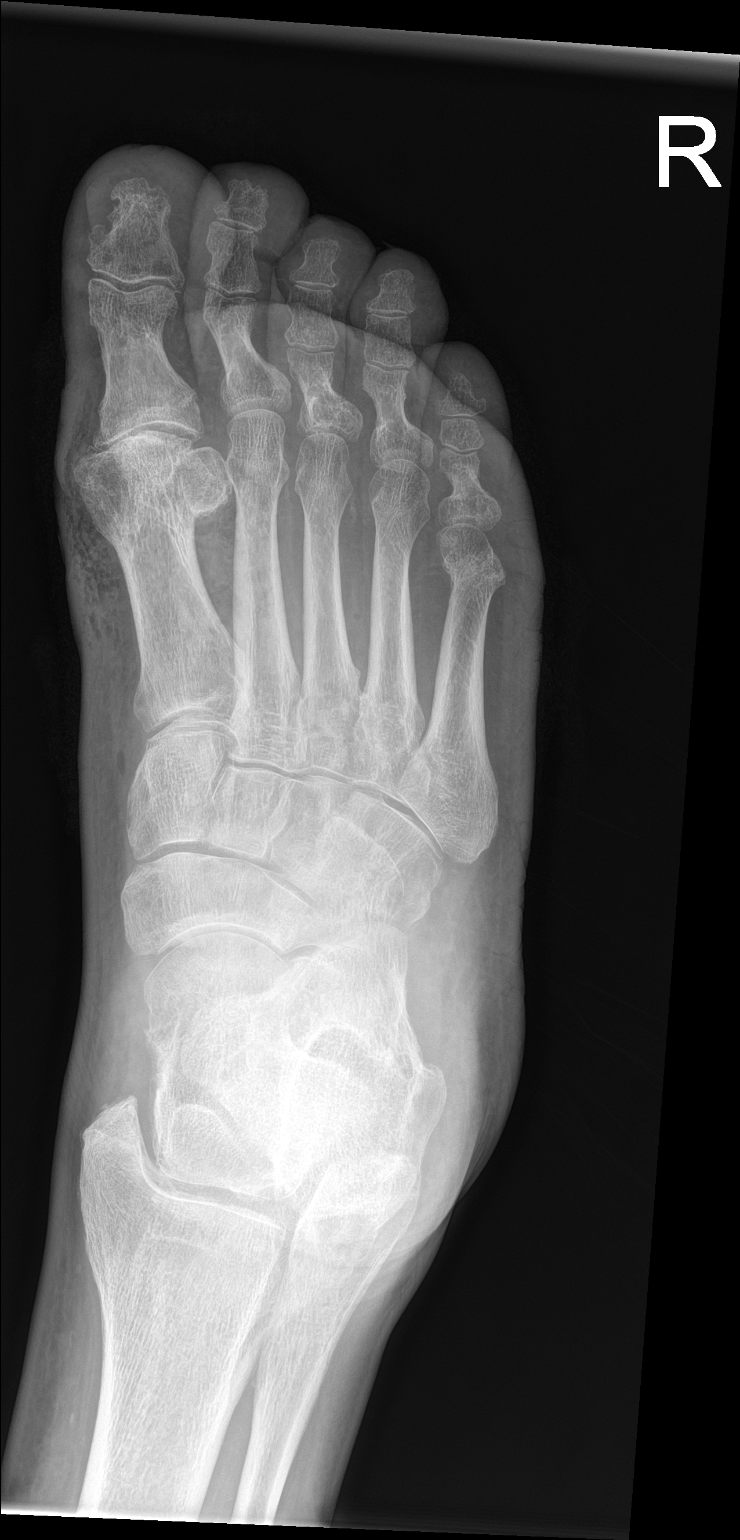

[foot lat]
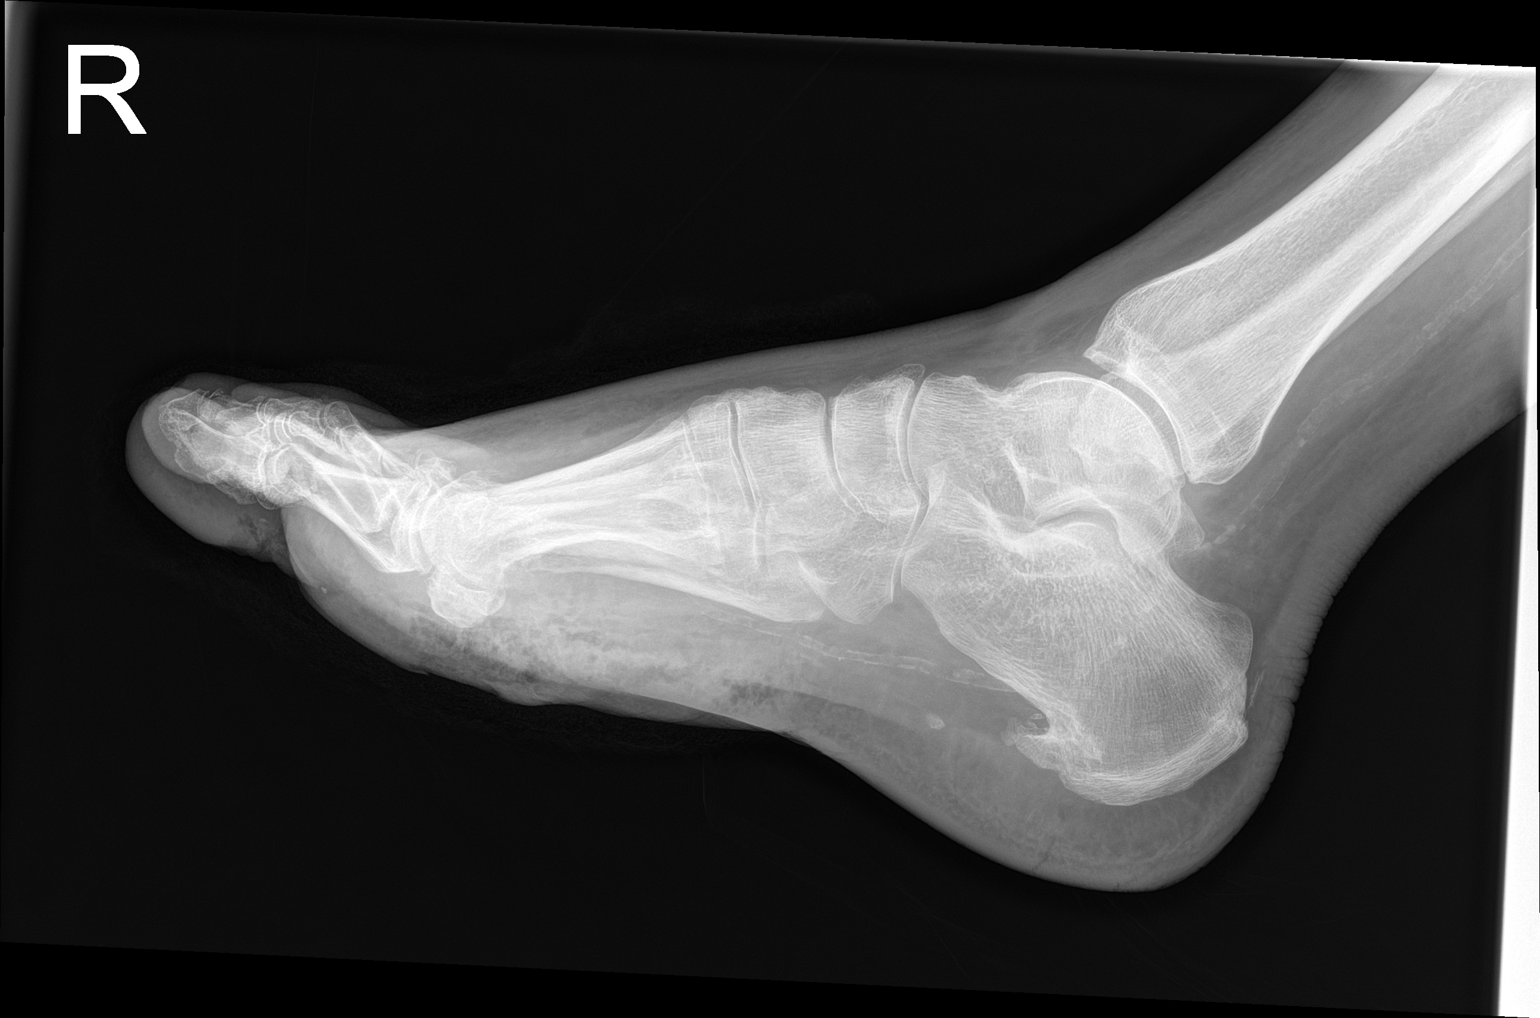

[3 of 3 positions shown; findings below may reference images not displayed]

FINDINGS: Soft tissue swelling with subcutaneous gas of the forefoot,
predominantly within the plantar soft tissues. No displaced
fracture.

Degenerative changes of the forefoot and midfoot. Degenerative
changes of the hindfoot with enthesopathic changes at the Achilles
insertion and plantar fascia insertion.

Dense calcifications of the tibial plantar arteries.
IMPRESSION: Soft tissue swelling with subcutaneous gas, concerning for
necrotizing infection.

Atherosclerosis.

Degenerative changes of the foot.

## 2021-04-11 DIAGNOSIS — D631 Anemia in chronic kidney disease: Secondary | ICD-10-CM | POA: Diagnosis not present

## 2021-04-11 DIAGNOSIS — D509 Iron deficiency anemia, unspecified: Secondary | ICD-10-CM | POA: Diagnosis not present

## 2021-04-11 DIAGNOSIS — N2581 Secondary hyperparathyroidism of renal origin: Secondary | ICD-10-CM | POA: Diagnosis not present

## 2021-04-11 DIAGNOSIS — Z992 Dependence on renal dialysis: Secondary | ICD-10-CM | POA: Diagnosis not present

## 2021-04-11 DIAGNOSIS — N186 End stage renal disease: Secondary | ICD-10-CM | POA: Diagnosis not present

## 2021-04-12 DIAGNOSIS — N186 End stage renal disease: Secondary | ICD-10-CM | POA: Diagnosis not present

## 2021-04-12 DIAGNOSIS — Z992 Dependence on renal dialysis: Secondary | ICD-10-CM | POA: Diagnosis not present

## 2021-04-13 DIAGNOSIS — D631 Anemia in chronic kidney disease: Secondary | ICD-10-CM | POA: Diagnosis not present

## 2021-04-13 DIAGNOSIS — N186 End stage renal disease: Secondary | ICD-10-CM | POA: Diagnosis not present

## 2021-04-13 DIAGNOSIS — Z992 Dependence on renal dialysis: Secondary | ICD-10-CM | POA: Diagnosis not present

## 2021-04-13 DIAGNOSIS — D509 Iron deficiency anemia, unspecified: Secondary | ICD-10-CM | POA: Diagnosis not present

## 2021-04-14 DIAGNOSIS — Z79891 Long term (current) use of opiate analgesic: Secondary | ICD-10-CM | POA: Diagnosis not present

## 2021-04-15 DIAGNOSIS — D509 Iron deficiency anemia, unspecified: Secondary | ICD-10-CM | POA: Diagnosis not present

## 2021-04-15 DIAGNOSIS — N186 End stage renal disease: Secondary | ICD-10-CM | POA: Diagnosis not present

## 2021-04-15 DIAGNOSIS — D631 Anemia in chronic kidney disease: Secondary | ICD-10-CM | POA: Diagnosis not present

## 2021-04-15 DIAGNOSIS — Z992 Dependence on renal dialysis: Secondary | ICD-10-CM | POA: Diagnosis not present

## 2021-04-17 DIAGNOSIS — Z20822 Contact with and (suspected) exposure to covid-19: Secondary | ICD-10-CM | POA: Diagnosis not present

## 2021-04-18 DIAGNOSIS — Z992 Dependence on renal dialysis: Secondary | ICD-10-CM | POA: Diagnosis not present

## 2021-04-18 DIAGNOSIS — N186 End stage renal disease: Secondary | ICD-10-CM | POA: Diagnosis not present

## 2021-04-18 DIAGNOSIS — D631 Anemia in chronic kidney disease: Secondary | ICD-10-CM | POA: Diagnosis not present

## 2021-04-18 DIAGNOSIS — D509 Iron deficiency anemia, unspecified: Secondary | ICD-10-CM | POA: Diagnosis not present

## 2021-04-20 DIAGNOSIS — Z992 Dependence on renal dialysis: Secondary | ICD-10-CM | POA: Diagnosis not present

## 2021-04-20 DIAGNOSIS — N186 End stage renal disease: Secondary | ICD-10-CM | POA: Diagnosis not present

## 2021-04-20 DIAGNOSIS — D631 Anemia in chronic kidney disease: Secondary | ICD-10-CM | POA: Diagnosis not present

## 2021-04-20 DIAGNOSIS — D509 Iron deficiency anemia, unspecified: Secondary | ICD-10-CM | POA: Diagnosis not present

## 2021-04-22 DIAGNOSIS — D509 Iron deficiency anemia, unspecified: Secondary | ICD-10-CM | POA: Diagnosis not present

## 2021-04-22 DIAGNOSIS — Z992 Dependence on renal dialysis: Secondary | ICD-10-CM | POA: Diagnosis not present

## 2021-04-22 DIAGNOSIS — D631 Anemia in chronic kidney disease: Secondary | ICD-10-CM | POA: Diagnosis not present

## 2021-04-22 DIAGNOSIS — N186 End stage renal disease: Secondary | ICD-10-CM | POA: Diagnosis not present

## 2021-04-25 DIAGNOSIS — D509 Iron deficiency anemia, unspecified: Secondary | ICD-10-CM | POA: Diagnosis not present

## 2021-04-25 DIAGNOSIS — Z992 Dependence on renal dialysis: Secondary | ICD-10-CM | POA: Diagnosis not present

## 2021-04-25 DIAGNOSIS — N186 End stage renal disease: Secondary | ICD-10-CM | POA: Diagnosis not present

## 2021-04-25 DIAGNOSIS — D631 Anemia in chronic kidney disease: Secondary | ICD-10-CM | POA: Diagnosis not present

## 2021-04-27 DIAGNOSIS — N186 End stage renal disease: Secondary | ICD-10-CM | POA: Diagnosis not present

## 2021-04-27 DIAGNOSIS — D509 Iron deficiency anemia, unspecified: Secondary | ICD-10-CM | POA: Diagnosis not present

## 2021-04-27 DIAGNOSIS — Z992 Dependence on renal dialysis: Secondary | ICD-10-CM | POA: Diagnosis not present

## 2021-04-27 DIAGNOSIS — D631 Anemia in chronic kidney disease: Secondary | ICD-10-CM | POA: Diagnosis not present

## 2021-04-29 DIAGNOSIS — D509 Iron deficiency anemia, unspecified: Secondary | ICD-10-CM | POA: Diagnosis not present

## 2021-04-29 DIAGNOSIS — Z992 Dependence on renal dialysis: Secondary | ICD-10-CM | POA: Diagnosis not present

## 2021-04-29 DIAGNOSIS — D631 Anemia in chronic kidney disease: Secondary | ICD-10-CM | POA: Diagnosis not present

## 2021-04-29 DIAGNOSIS — N186 End stage renal disease: Secondary | ICD-10-CM | POA: Diagnosis not present

## 2021-05-02 DIAGNOSIS — D631 Anemia in chronic kidney disease: Secondary | ICD-10-CM | POA: Diagnosis not present

## 2021-05-02 DIAGNOSIS — D509 Iron deficiency anemia, unspecified: Secondary | ICD-10-CM | POA: Diagnosis not present

## 2021-05-02 DIAGNOSIS — Z992 Dependence on renal dialysis: Secondary | ICD-10-CM | POA: Diagnosis not present

## 2021-05-02 DIAGNOSIS — N186 End stage renal disease: Secondary | ICD-10-CM | POA: Diagnosis not present

## 2021-05-04 DIAGNOSIS — D631 Anemia in chronic kidney disease: Secondary | ICD-10-CM | POA: Diagnosis not present

## 2021-05-04 DIAGNOSIS — Z992 Dependence on renal dialysis: Secondary | ICD-10-CM | POA: Diagnosis not present

## 2021-05-04 DIAGNOSIS — D509 Iron deficiency anemia, unspecified: Secondary | ICD-10-CM | POA: Diagnosis not present

## 2021-05-04 DIAGNOSIS — N186 End stage renal disease: Secondary | ICD-10-CM | POA: Diagnosis not present

## 2021-05-06 DIAGNOSIS — N186 End stage renal disease: Secondary | ICD-10-CM | POA: Diagnosis not present

## 2021-05-06 DIAGNOSIS — D631 Anemia in chronic kidney disease: Secondary | ICD-10-CM | POA: Diagnosis not present

## 2021-05-06 DIAGNOSIS — Z992 Dependence on renal dialysis: Secondary | ICD-10-CM | POA: Diagnosis not present

## 2021-05-06 DIAGNOSIS — D509 Iron deficiency anemia, unspecified: Secondary | ICD-10-CM | POA: Diagnosis not present

## 2021-05-09 DIAGNOSIS — N186 End stage renal disease: Secondary | ICD-10-CM | POA: Diagnosis not present

## 2021-05-09 DIAGNOSIS — D631 Anemia in chronic kidney disease: Secondary | ICD-10-CM | POA: Diagnosis not present

## 2021-05-09 DIAGNOSIS — D509 Iron deficiency anemia, unspecified: Secondary | ICD-10-CM | POA: Diagnosis not present

## 2021-05-09 DIAGNOSIS — Z992 Dependence on renal dialysis: Secondary | ICD-10-CM | POA: Diagnosis not present

## 2021-05-11 DIAGNOSIS — M792 Neuralgia and neuritis, unspecified: Secondary | ICD-10-CM | POA: Diagnosis not present

## 2021-05-11 DIAGNOSIS — D631 Anemia in chronic kidney disease: Secondary | ICD-10-CM | POA: Diagnosis not present

## 2021-05-11 DIAGNOSIS — Z992 Dependence on renal dialysis: Secondary | ICD-10-CM | POA: Diagnosis not present

## 2021-05-11 DIAGNOSIS — D509 Iron deficiency anemia, unspecified: Secondary | ICD-10-CM | POA: Diagnosis not present

## 2021-05-11 DIAGNOSIS — N186 End stage renal disease: Secondary | ICD-10-CM | POA: Diagnosis not present

## 2021-05-11 DIAGNOSIS — Z79891 Long term (current) use of opiate analgesic: Secondary | ICD-10-CM | POA: Diagnosis not present

## 2021-05-12 DIAGNOSIS — Z992 Dependence on renal dialysis: Secondary | ICD-10-CM | POA: Diagnosis not present

## 2021-05-12 DIAGNOSIS — N186 End stage renal disease: Secondary | ICD-10-CM | POA: Diagnosis not present

## 2021-05-13 DIAGNOSIS — D631 Anemia in chronic kidney disease: Secondary | ICD-10-CM | POA: Diagnosis not present

## 2021-05-13 DIAGNOSIS — D509 Iron deficiency anemia, unspecified: Secondary | ICD-10-CM | POA: Diagnosis not present

## 2021-05-13 DIAGNOSIS — Z992 Dependence on renal dialysis: Secondary | ICD-10-CM | POA: Diagnosis not present

## 2021-05-13 DIAGNOSIS — N2581 Secondary hyperparathyroidism of renal origin: Secondary | ICD-10-CM | POA: Diagnosis not present

## 2021-05-13 DIAGNOSIS — N186 End stage renal disease: Secondary | ICD-10-CM | POA: Diagnosis not present

## 2021-05-15 DIAGNOSIS — E11622 Type 2 diabetes mellitus with other skin ulcer: Secondary | ICD-10-CM | POA: Diagnosis not present

## 2021-05-15 DIAGNOSIS — E1122 Type 2 diabetes mellitus with diabetic chronic kidney disease: Secondary | ICD-10-CM | POA: Diagnosis not present

## 2021-05-15 DIAGNOSIS — E782 Mixed hyperlipidemia: Secondary | ICD-10-CM | POA: Diagnosis not present

## 2021-05-15 DIAGNOSIS — K219 Gastro-esophageal reflux disease without esophagitis: Secondary | ICD-10-CM | POA: Diagnosis not present

## 2021-05-15 DIAGNOSIS — N189 Chronic kidney disease, unspecified: Secondary | ICD-10-CM | POA: Diagnosis not present

## 2021-05-15 DIAGNOSIS — J449 Chronic obstructive pulmonary disease, unspecified: Secondary | ICD-10-CM | POA: Diagnosis not present

## 2021-05-15 DIAGNOSIS — E039 Hypothyroidism, unspecified: Secondary | ICD-10-CM | POA: Diagnosis not present

## 2021-05-15 DIAGNOSIS — E1142 Type 2 diabetes mellitus with diabetic polyneuropathy: Secondary | ICD-10-CM | POA: Diagnosis not present

## 2021-05-15 DIAGNOSIS — E7849 Other hyperlipidemia: Secondary | ICD-10-CM | POA: Diagnosis not present

## 2021-05-16 DIAGNOSIS — N2581 Secondary hyperparathyroidism of renal origin: Secondary | ICD-10-CM | POA: Diagnosis not present

## 2021-05-16 DIAGNOSIS — D509 Iron deficiency anemia, unspecified: Secondary | ICD-10-CM | POA: Diagnosis not present

## 2021-05-16 DIAGNOSIS — N186 End stage renal disease: Secondary | ICD-10-CM | POA: Diagnosis not present

## 2021-05-16 DIAGNOSIS — D631 Anemia in chronic kidney disease: Secondary | ICD-10-CM | POA: Diagnosis not present

## 2021-05-16 DIAGNOSIS — Z992 Dependence on renal dialysis: Secondary | ICD-10-CM | POA: Diagnosis not present

## 2021-05-17 DIAGNOSIS — E1122 Type 2 diabetes mellitus with diabetic chronic kidney disease: Secondary | ICD-10-CM | POA: Diagnosis not present

## 2021-05-17 DIAGNOSIS — Z992 Dependence on renal dialysis: Secondary | ICD-10-CM | POA: Diagnosis not present

## 2021-05-17 DIAGNOSIS — Z23 Encounter for immunization: Secondary | ICD-10-CM | POA: Diagnosis not present

## 2021-05-17 DIAGNOSIS — E1142 Type 2 diabetes mellitus with diabetic polyneuropathy: Secondary | ICD-10-CM | POA: Diagnosis not present

## 2021-05-17 DIAGNOSIS — I10 Essential (primary) hypertension: Secondary | ICD-10-CM | POA: Diagnosis not present

## 2021-05-17 DIAGNOSIS — R4582 Worries: Secondary | ICD-10-CM | POA: Diagnosis not present

## 2021-05-17 DIAGNOSIS — E11622 Type 2 diabetes mellitus with other skin ulcer: Secondary | ICD-10-CM | POA: Diagnosis not present

## 2021-05-17 DIAGNOSIS — E11621 Type 2 diabetes mellitus with foot ulcer: Secondary | ICD-10-CM | POA: Diagnosis not present

## 2021-05-18 DIAGNOSIS — D631 Anemia in chronic kidney disease: Secondary | ICD-10-CM | POA: Diagnosis not present

## 2021-05-18 DIAGNOSIS — Z794 Long term (current) use of insulin: Secondary | ICD-10-CM | POA: Diagnosis not present

## 2021-05-18 DIAGNOSIS — N186 End stage renal disease: Secondary | ICD-10-CM | POA: Diagnosis not present

## 2021-05-18 DIAGNOSIS — E119 Type 2 diabetes mellitus without complications: Secondary | ICD-10-CM | POA: Diagnosis not present

## 2021-05-18 DIAGNOSIS — Z992 Dependence on renal dialysis: Secondary | ICD-10-CM | POA: Diagnosis not present

## 2021-05-18 DIAGNOSIS — D509 Iron deficiency anemia, unspecified: Secondary | ICD-10-CM | POA: Diagnosis not present

## 2021-05-18 DIAGNOSIS — N2581 Secondary hyperparathyroidism of renal origin: Secondary | ICD-10-CM | POA: Diagnosis not present

## 2021-05-20 DIAGNOSIS — Z992 Dependence on renal dialysis: Secondary | ICD-10-CM | POA: Diagnosis not present

## 2021-05-20 DIAGNOSIS — N2581 Secondary hyperparathyroidism of renal origin: Secondary | ICD-10-CM | POA: Diagnosis not present

## 2021-05-20 DIAGNOSIS — D509 Iron deficiency anemia, unspecified: Secondary | ICD-10-CM | POA: Diagnosis not present

## 2021-05-20 DIAGNOSIS — D631 Anemia in chronic kidney disease: Secondary | ICD-10-CM | POA: Diagnosis not present

## 2021-05-20 DIAGNOSIS — N186 End stage renal disease: Secondary | ICD-10-CM | POA: Diagnosis not present

## 2021-05-23 DIAGNOSIS — N186 End stage renal disease: Secondary | ICD-10-CM | POA: Diagnosis not present

## 2021-05-23 DIAGNOSIS — N2581 Secondary hyperparathyroidism of renal origin: Secondary | ICD-10-CM | POA: Diagnosis not present

## 2021-05-23 DIAGNOSIS — D509 Iron deficiency anemia, unspecified: Secondary | ICD-10-CM | POA: Diagnosis not present

## 2021-05-23 DIAGNOSIS — Z992 Dependence on renal dialysis: Secondary | ICD-10-CM | POA: Diagnosis not present

## 2021-05-23 DIAGNOSIS — D631 Anemia in chronic kidney disease: Secondary | ICD-10-CM | POA: Diagnosis not present

## 2021-05-25 DIAGNOSIS — N186 End stage renal disease: Secondary | ICD-10-CM | POA: Diagnosis not present

## 2021-05-25 DIAGNOSIS — D509 Iron deficiency anemia, unspecified: Secondary | ICD-10-CM | POA: Diagnosis not present

## 2021-05-25 DIAGNOSIS — D631 Anemia in chronic kidney disease: Secondary | ICD-10-CM | POA: Diagnosis not present

## 2021-05-25 DIAGNOSIS — N2581 Secondary hyperparathyroidism of renal origin: Secondary | ICD-10-CM | POA: Diagnosis not present

## 2021-05-25 DIAGNOSIS — Z992 Dependence on renal dialysis: Secondary | ICD-10-CM | POA: Diagnosis not present

## 2021-05-27 DIAGNOSIS — D631 Anemia in chronic kidney disease: Secondary | ICD-10-CM | POA: Diagnosis not present

## 2021-05-27 DIAGNOSIS — N2581 Secondary hyperparathyroidism of renal origin: Secondary | ICD-10-CM | POA: Diagnosis not present

## 2021-05-27 DIAGNOSIS — Z992 Dependence on renal dialysis: Secondary | ICD-10-CM | POA: Diagnosis not present

## 2021-05-27 DIAGNOSIS — N186 End stage renal disease: Secondary | ICD-10-CM | POA: Diagnosis not present

## 2021-05-27 DIAGNOSIS — D509 Iron deficiency anemia, unspecified: Secondary | ICD-10-CM | POA: Diagnosis not present

## 2021-05-29 DIAGNOSIS — N186 End stage renal disease: Secondary | ICD-10-CM | POA: Diagnosis not present

## 2021-05-29 DIAGNOSIS — N2581 Secondary hyperparathyroidism of renal origin: Secondary | ICD-10-CM | POA: Diagnosis not present

## 2021-05-29 DIAGNOSIS — D631 Anemia in chronic kidney disease: Secondary | ICD-10-CM | POA: Diagnosis not present

## 2021-05-29 DIAGNOSIS — Z992 Dependence on renal dialysis: Secondary | ICD-10-CM | POA: Diagnosis not present

## 2021-05-29 DIAGNOSIS — D509 Iron deficiency anemia, unspecified: Secondary | ICD-10-CM | POA: Diagnosis not present

## 2021-05-30 ENCOUNTER — Encounter (INDEPENDENT_AMBULATORY_CARE_PROVIDER_SITE_OTHER): Payer: Self-pay | Admitting: *Deleted

## 2021-05-30 DIAGNOSIS — Z992 Dependence on renal dialysis: Secondary | ICD-10-CM | POA: Diagnosis not present

## 2021-05-30 DIAGNOSIS — D631 Anemia in chronic kidney disease: Secondary | ICD-10-CM | POA: Diagnosis not present

## 2021-05-30 DIAGNOSIS — D509 Iron deficiency anemia, unspecified: Secondary | ICD-10-CM | POA: Diagnosis not present

## 2021-05-30 DIAGNOSIS — N2581 Secondary hyperparathyroidism of renal origin: Secondary | ICD-10-CM | POA: Diagnosis not present

## 2021-05-30 DIAGNOSIS — N186 End stage renal disease: Secondary | ICD-10-CM | POA: Diagnosis not present

## 2021-06-01 DIAGNOSIS — N186 End stage renal disease: Secondary | ICD-10-CM | POA: Diagnosis not present

## 2021-06-01 DIAGNOSIS — Z992 Dependence on renal dialysis: Secondary | ICD-10-CM | POA: Diagnosis not present

## 2021-06-01 DIAGNOSIS — D509 Iron deficiency anemia, unspecified: Secondary | ICD-10-CM | POA: Diagnosis not present

## 2021-06-01 DIAGNOSIS — N2581 Secondary hyperparathyroidism of renal origin: Secondary | ICD-10-CM | POA: Diagnosis not present

## 2021-06-01 DIAGNOSIS — D631 Anemia in chronic kidney disease: Secondary | ICD-10-CM | POA: Diagnosis not present

## 2021-06-03 DIAGNOSIS — Z992 Dependence on renal dialysis: Secondary | ICD-10-CM | POA: Diagnosis not present

## 2021-06-03 DIAGNOSIS — N2581 Secondary hyperparathyroidism of renal origin: Secondary | ICD-10-CM | POA: Diagnosis not present

## 2021-06-03 DIAGNOSIS — D631 Anemia in chronic kidney disease: Secondary | ICD-10-CM | POA: Diagnosis not present

## 2021-06-03 DIAGNOSIS — N186 End stage renal disease: Secondary | ICD-10-CM | POA: Diagnosis not present

## 2021-06-03 DIAGNOSIS — D509 Iron deficiency anemia, unspecified: Secondary | ICD-10-CM | POA: Diagnosis not present

## 2021-06-05 DIAGNOSIS — R109 Unspecified abdominal pain: Secondary | ICD-10-CM | POA: Diagnosis not present

## 2021-06-05 DIAGNOSIS — R1084 Generalized abdominal pain: Secondary | ICD-10-CM | POA: Diagnosis not present

## 2021-06-05 DIAGNOSIS — R110 Nausea: Secondary | ICD-10-CM | POA: Diagnosis not present

## 2021-06-06 DIAGNOSIS — D631 Anemia in chronic kidney disease: Secondary | ICD-10-CM | POA: Diagnosis not present

## 2021-06-06 DIAGNOSIS — N2581 Secondary hyperparathyroidism of renal origin: Secondary | ICD-10-CM | POA: Diagnosis not present

## 2021-06-06 DIAGNOSIS — Z992 Dependence on renal dialysis: Secondary | ICD-10-CM | POA: Diagnosis not present

## 2021-06-06 DIAGNOSIS — N186 End stage renal disease: Secondary | ICD-10-CM | POA: Diagnosis not present

## 2021-06-06 DIAGNOSIS — D509 Iron deficiency anemia, unspecified: Secondary | ICD-10-CM | POA: Diagnosis not present

## 2021-06-08 DIAGNOSIS — Z992 Dependence on renal dialysis: Secondary | ICD-10-CM | POA: Diagnosis not present

## 2021-06-08 DIAGNOSIS — D509 Iron deficiency anemia, unspecified: Secondary | ICD-10-CM | POA: Diagnosis not present

## 2021-06-08 DIAGNOSIS — D631 Anemia in chronic kidney disease: Secondary | ICD-10-CM | POA: Diagnosis not present

## 2021-06-08 DIAGNOSIS — N2581 Secondary hyperparathyroidism of renal origin: Secondary | ICD-10-CM | POA: Diagnosis not present

## 2021-06-08 DIAGNOSIS — N186 End stage renal disease: Secondary | ICD-10-CM | POA: Diagnosis not present

## 2021-06-10 DIAGNOSIS — N186 End stage renal disease: Secondary | ICD-10-CM | POA: Diagnosis not present

## 2021-06-10 DIAGNOSIS — D509 Iron deficiency anemia, unspecified: Secondary | ICD-10-CM | POA: Diagnosis not present

## 2021-06-10 DIAGNOSIS — N2581 Secondary hyperparathyroidism of renal origin: Secondary | ICD-10-CM | POA: Diagnosis not present

## 2021-06-10 DIAGNOSIS — D631 Anemia in chronic kidney disease: Secondary | ICD-10-CM | POA: Diagnosis not present

## 2021-06-10 DIAGNOSIS — Z992 Dependence on renal dialysis: Secondary | ICD-10-CM | POA: Diagnosis not present

## 2021-06-12 DIAGNOSIS — N186 End stage renal disease: Secondary | ICD-10-CM | POA: Diagnosis not present

## 2021-06-12 DIAGNOSIS — Z992 Dependence on renal dialysis: Secondary | ICD-10-CM | POA: Diagnosis not present

## 2021-06-13 DIAGNOSIS — D631 Anemia in chronic kidney disease: Secondary | ICD-10-CM | POA: Diagnosis not present

## 2021-06-13 DIAGNOSIS — Z20828 Contact with and (suspected) exposure to other viral communicable diseases: Secondary | ICD-10-CM | POA: Diagnosis not present

## 2021-06-13 DIAGNOSIS — D509 Iron deficiency anemia, unspecified: Secondary | ICD-10-CM | POA: Diagnosis not present

## 2021-06-13 DIAGNOSIS — N2581 Secondary hyperparathyroidism of renal origin: Secondary | ICD-10-CM | POA: Diagnosis not present

## 2021-06-13 DIAGNOSIS — Z992 Dependence on renal dialysis: Secondary | ICD-10-CM | POA: Diagnosis not present

## 2021-06-13 DIAGNOSIS — N186 End stage renal disease: Secondary | ICD-10-CM | POA: Diagnosis not present

## 2021-06-14 DIAGNOSIS — Z20822 Contact with and (suspected) exposure to covid-19: Secondary | ICD-10-CM | POA: Diagnosis not present

## 2021-06-15 DIAGNOSIS — Z992 Dependence on renal dialysis: Secondary | ICD-10-CM | POA: Diagnosis not present

## 2021-06-15 DIAGNOSIS — N2581 Secondary hyperparathyroidism of renal origin: Secondary | ICD-10-CM | POA: Diagnosis not present

## 2021-06-15 DIAGNOSIS — N186 End stage renal disease: Secondary | ICD-10-CM | POA: Diagnosis not present

## 2021-06-15 DIAGNOSIS — R110 Nausea: Secondary | ICD-10-CM | POA: Diagnosis not present

## 2021-06-15 DIAGNOSIS — D509 Iron deficiency anemia, unspecified: Secondary | ICD-10-CM | POA: Diagnosis not present

## 2021-06-15 DIAGNOSIS — D631 Anemia in chronic kidney disease: Secondary | ICD-10-CM | POA: Diagnosis not present

## 2021-06-16 ENCOUNTER — Other Ambulatory Visit: Payer: Self-pay | Admitting: Family Medicine

## 2021-06-16 ENCOUNTER — Other Ambulatory Visit (HOSPITAL_COMMUNITY): Payer: Self-pay | Admitting: Family Medicine

## 2021-06-16 DIAGNOSIS — R1011 Right upper quadrant pain: Secondary | ICD-10-CM

## 2021-06-16 DIAGNOSIS — N19 Unspecified kidney failure: Secondary | ICD-10-CM

## 2021-06-17 DIAGNOSIS — D631 Anemia in chronic kidney disease: Secondary | ICD-10-CM | POA: Diagnosis not present

## 2021-06-17 DIAGNOSIS — Z992 Dependence on renal dialysis: Secondary | ICD-10-CM | POA: Diagnosis not present

## 2021-06-17 DIAGNOSIS — N2581 Secondary hyperparathyroidism of renal origin: Secondary | ICD-10-CM | POA: Diagnosis not present

## 2021-06-17 DIAGNOSIS — N186 End stage renal disease: Secondary | ICD-10-CM | POA: Diagnosis not present

## 2021-06-17 DIAGNOSIS — D509 Iron deficiency anemia, unspecified: Secondary | ICD-10-CM | POA: Diagnosis not present

## 2021-06-20 DIAGNOSIS — Z992 Dependence on renal dialysis: Secondary | ICD-10-CM | POA: Diagnosis not present

## 2021-06-20 DIAGNOSIS — D509 Iron deficiency anemia, unspecified: Secondary | ICD-10-CM | POA: Diagnosis not present

## 2021-06-20 DIAGNOSIS — D631 Anemia in chronic kidney disease: Secondary | ICD-10-CM | POA: Diagnosis not present

## 2021-06-20 DIAGNOSIS — N186 End stage renal disease: Secondary | ICD-10-CM | POA: Diagnosis not present

## 2021-06-20 DIAGNOSIS — N2581 Secondary hyperparathyroidism of renal origin: Secondary | ICD-10-CM | POA: Diagnosis not present

## 2021-06-22 DIAGNOSIS — D631 Anemia in chronic kidney disease: Secondary | ICD-10-CM | POA: Diagnosis not present

## 2021-06-22 DIAGNOSIS — N2581 Secondary hyperparathyroidism of renal origin: Secondary | ICD-10-CM | POA: Diagnosis not present

## 2021-06-22 DIAGNOSIS — N186 End stage renal disease: Secondary | ICD-10-CM | POA: Diagnosis not present

## 2021-06-22 DIAGNOSIS — D509 Iron deficiency anemia, unspecified: Secondary | ICD-10-CM | POA: Diagnosis not present

## 2021-06-22 DIAGNOSIS — Z992 Dependence on renal dialysis: Secondary | ICD-10-CM | POA: Diagnosis not present

## 2021-06-24 DIAGNOSIS — N186 End stage renal disease: Secondary | ICD-10-CM | POA: Diagnosis not present

## 2021-06-24 DIAGNOSIS — N2581 Secondary hyperparathyroidism of renal origin: Secondary | ICD-10-CM | POA: Diagnosis not present

## 2021-06-24 DIAGNOSIS — D631 Anemia in chronic kidney disease: Secondary | ICD-10-CM | POA: Diagnosis not present

## 2021-06-24 DIAGNOSIS — Z992 Dependence on renal dialysis: Secondary | ICD-10-CM | POA: Diagnosis not present

## 2021-06-24 DIAGNOSIS — D509 Iron deficiency anemia, unspecified: Secondary | ICD-10-CM | POA: Diagnosis not present

## 2021-06-27 DIAGNOSIS — D509 Iron deficiency anemia, unspecified: Secondary | ICD-10-CM | POA: Diagnosis not present

## 2021-06-27 DIAGNOSIS — N186 End stage renal disease: Secondary | ICD-10-CM | POA: Diagnosis not present

## 2021-06-27 DIAGNOSIS — N2581 Secondary hyperparathyroidism of renal origin: Secondary | ICD-10-CM | POA: Diagnosis not present

## 2021-06-27 DIAGNOSIS — Z992 Dependence on renal dialysis: Secondary | ICD-10-CM | POA: Diagnosis not present

## 2021-06-27 DIAGNOSIS — D631 Anemia in chronic kidney disease: Secondary | ICD-10-CM | POA: Diagnosis not present

## 2021-06-28 DIAGNOSIS — N186 End stage renal disease: Secondary | ICD-10-CM | POA: Diagnosis not present

## 2021-06-28 DIAGNOSIS — D631 Anemia in chronic kidney disease: Secondary | ICD-10-CM | POA: Diagnosis not present

## 2021-06-28 DIAGNOSIS — D509 Iron deficiency anemia, unspecified: Secondary | ICD-10-CM | POA: Diagnosis not present

## 2021-06-28 DIAGNOSIS — Z992 Dependence on renal dialysis: Secondary | ICD-10-CM | POA: Diagnosis not present

## 2021-06-28 DIAGNOSIS — N2581 Secondary hyperparathyroidism of renal origin: Secondary | ICD-10-CM | POA: Diagnosis not present

## 2021-06-29 DIAGNOSIS — D509 Iron deficiency anemia, unspecified: Secondary | ICD-10-CM | POA: Diagnosis not present

## 2021-06-29 DIAGNOSIS — D631 Anemia in chronic kidney disease: Secondary | ICD-10-CM | POA: Diagnosis not present

## 2021-06-29 DIAGNOSIS — Z992 Dependence on renal dialysis: Secondary | ICD-10-CM | POA: Diagnosis not present

## 2021-06-29 DIAGNOSIS — N2581 Secondary hyperparathyroidism of renal origin: Secondary | ICD-10-CM | POA: Diagnosis not present

## 2021-06-29 DIAGNOSIS — N186 End stage renal disease: Secondary | ICD-10-CM | POA: Diagnosis not present

## 2021-06-30 ENCOUNTER — Ambulatory Visit (HOSPITAL_COMMUNITY)
Admission: RE | Admit: 2021-06-30 | Discharge: 2021-06-30 | Disposition: A | Discharge status: Home / Self Care | Payer: Medicare Other | Source: Ambulatory Visit | Attending: Family Medicine | Admitting: Family Medicine

## 2021-06-30 ENCOUNTER — Other Ambulatory Visit: Payer: Self-pay

## 2021-06-30 ENCOUNTER — Other Ambulatory Visit (HOSPITAL_COMMUNITY): Payer: Self-pay | Admitting: Family Medicine

## 2021-06-30 DIAGNOSIS — N19 Unspecified kidney failure: Secondary | ICD-10-CM

## 2021-06-30 DIAGNOSIS — K862 Cyst of pancreas: Secondary | ICD-10-CM | POA: Diagnosis not present

## 2021-06-30 DIAGNOSIS — R1011 Right upper quadrant pain: Secondary | ICD-10-CM | POA: Insufficient documentation

## 2021-06-30 DIAGNOSIS — N281 Cyst of kidney, acquired: Secondary | ICD-10-CM | POA: Diagnosis not present

## 2021-06-30 DIAGNOSIS — N261 Atrophy of kidney (terminal): Secondary | ICD-10-CM | POA: Diagnosis not present

## 2021-06-30 DIAGNOSIS — K838 Other specified diseases of biliary tract: Secondary | ICD-10-CM | POA: Diagnosis not present

## 2021-07-01 DIAGNOSIS — D509 Iron deficiency anemia, unspecified: Secondary | ICD-10-CM | POA: Diagnosis not present

## 2021-07-01 DIAGNOSIS — Z992 Dependence on renal dialysis: Secondary | ICD-10-CM | POA: Diagnosis not present

## 2021-07-01 DIAGNOSIS — N186 End stage renal disease: Secondary | ICD-10-CM | POA: Diagnosis not present

## 2021-07-01 DIAGNOSIS — D631 Anemia in chronic kidney disease: Secondary | ICD-10-CM | POA: Diagnosis not present

## 2021-07-01 DIAGNOSIS — N2581 Secondary hyperparathyroidism of renal origin: Secondary | ICD-10-CM | POA: Diagnosis not present

## 2021-07-04 DIAGNOSIS — N186 End stage renal disease: Secondary | ICD-10-CM | POA: Diagnosis not present

## 2021-07-04 DIAGNOSIS — D509 Iron deficiency anemia, unspecified: Secondary | ICD-10-CM | POA: Diagnosis not present

## 2021-07-04 DIAGNOSIS — N2581 Secondary hyperparathyroidism of renal origin: Secondary | ICD-10-CM | POA: Diagnosis not present

## 2021-07-04 DIAGNOSIS — Z992 Dependence on renal dialysis: Secondary | ICD-10-CM | POA: Diagnosis not present

## 2021-07-04 DIAGNOSIS — D631 Anemia in chronic kidney disease: Secondary | ICD-10-CM | POA: Diagnosis not present

## 2021-07-06 DIAGNOSIS — Z992 Dependence on renal dialysis: Secondary | ICD-10-CM | POA: Diagnosis not present

## 2021-07-06 DIAGNOSIS — N186 End stage renal disease: Secondary | ICD-10-CM | POA: Diagnosis not present

## 2021-07-06 DIAGNOSIS — D631 Anemia in chronic kidney disease: Secondary | ICD-10-CM | POA: Diagnosis not present

## 2021-07-06 DIAGNOSIS — N2581 Secondary hyperparathyroidism of renal origin: Secondary | ICD-10-CM | POA: Diagnosis not present

## 2021-07-06 DIAGNOSIS — D509 Iron deficiency anemia, unspecified: Secondary | ICD-10-CM | POA: Diagnosis not present

## 2021-07-08 DIAGNOSIS — D631 Anemia in chronic kidney disease: Secondary | ICD-10-CM | POA: Diagnosis not present

## 2021-07-08 DIAGNOSIS — N2581 Secondary hyperparathyroidism of renal origin: Secondary | ICD-10-CM | POA: Diagnosis not present

## 2021-07-08 DIAGNOSIS — Z992 Dependence on renal dialysis: Secondary | ICD-10-CM | POA: Diagnosis not present

## 2021-07-08 DIAGNOSIS — D509 Iron deficiency anemia, unspecified: Secondary | ICD-10-CM | POA: Diagnosis not present

## 2021-07-08 DIAGNOSIS — N186 End stage renal disease: Secondary | ICD-10-CM | POA: Diagnosis not present

## 2021-07-11 DIAGNOSIS — N2581 Secondary hyperparathyroidism of renal origin: Secondary | ICD-10-CM | POA: Diagnosis not present

## 2021-07-11 DIAGNOSIS — D509 Iron deficiency anemia, unspecified: Secondary | ICD-10-CM | POA: Diagnosis not present

## 2021-07-11 DIAGNOSIS — Z992 Dependence on renal dialysis: Secondary | ICD-10-CM | POA: Diagnosis not present

## 2021-07-11 DIAGNOSIS — D631 Anemia in chronic kidney disease: Secondary | ICD-10-CM | POA: Diagnosis not present

## 2021-07-11 DIAGNOSIS — N186 End stage renal disease: Secondary | ICD-10-CM | POA: Diagnosis not present

## 2021-07-12 DIAGNOSIS — Z89619 Acquired absence of unspecified leg above knee: Secondary | ICD-10-CM | POA: Diagnosis not present

## 2021-07-12 DIAGNOSIS — N186 End stage renal disease: Secondary | ICD-10-CM | POA: Diagnosis not present

## 2021-07-12 DIAGNOSIS — I739 Peripheral vascular disease, unspecified: Secondary | ICD-10-CM | POA: Diagnosis not present

## 2021-07-12 DIAGNOSIS — F17210 Nicotine dependence, cigarettes, uncomplicated: Secondary | ICD-10-CM | POA: Diagnosis not present

## 2021-07-12 DIAGNOSIS — Z992 Dependence on renal dialysis: Secondary | ICD-10-CM | POA: Diagnosis not present

## 2021-07-13 DIAGNOSIS — N186 End stage renal disease: Secondary | ICD-10-CM | POA: Diagnosis not present

## 2021-07-13 DIAGNOSIS — D631 Anemia in chronic kidney disease: Secondary | ICD-10-CM | POA: Diagnosis not present

## 2021-07-13 DIAGNOSIS — N2581 Secondary hyperparathyroidism of renal origin: Secondary | ICD-10-CM | POA: Diagnosis not present

## 2021-07-13 DIAGNOSIS — Z992 Dependence on renal dialysis: Secondary | ICD-10-CM | POA: Diagnosis not present

## 2021-07-13 DIAGNOSIS — D509 Iron deficiency anemia, unspecified: Secondary | ICD-10-CM | POA: Diagnosis not present

## 2021-07-13 DIAGNOSIS — Z20828 Contact with and (suspected) exposure to other viral communicable diseases: Secondary | ICD-10-CM | POA: Diagnosis not present

## 2021-07-15 DIAGNOSIS — D509 Iron deficiency anemia, unspecified: Secondary | ICD-10-CM | POA: Diagnosis not present

## 2021-07-15 DIAGNOSIS — N2581 Secondary hyperparathyroidism of renal origin: Secondary | ICD-10-CM | POA: Diagnosis not present

## 2021-07-15 DIAGNOSIS — Z992 Dependence on renal dialysis: Secondary | ICD-10-CM | POA: Diagnosis not present

## 2021-07-15 DIAGNOSIS — N186 End stage renal disease: Secondary | ICD-10-CM | POA: Diagnosis not present

## 2021-07-15 DIAGNOSIS — D631 Anemia in chronic kidney disease: Secondary | ICD-10-CM | POA: Diagnosis not present

## 2021-07-17 ENCOUNTER — Ambulatory Visit (INDEPENDENT_AMBULATORY_CARE_PROVIDER_SITE_OTHER): Payer: Medicare Other | Admitting: Cardiology

## 2021-07-17 ENCOUNTER — Other Ambulatory Visit: Payer: Self-pay

## 2021-07-17 ENCOUNTER — Encounter: Payer: Self-pay | Admitting: Cardiology

## 2021-07-17 VITALS — BP 126/72 | HR 72 | Ht 76.0 in | Wt 238.0 lb

## 2021-07-17 DIAGNOSIS — N186 End stage renal disease: Secondary | ICD-10-CM | POA: Diagnosis not present

## 2021-07-17 DIAGNOSIS — I25119 Atherosclerotic heart disease of native coronary artery with unspecified angina pectoris: Secondary | ICD-10-CM

## 2021-07-17 DIAGNOSIS — I6522 Occlusion and stenosis of left carotid artery: Secondary | ICD-10-CM | POA: Diagnosis not present

## 2021-07-17 DIAGNOSIS — Z992 Dependence on renal dialysis: Secondary | ICD-10-CM | POA: Diagnosis not present

## 2021-07-17 DIAGNOSIS — E782 Mixed hyperlipidemia: Secondary | ICD-10-CM

## 2021-07-17 NOTE — Patient Instructions (Signed)

## 2021-07-17 NOTE — Progress Notes (Signed)
Cardiology Office Note  Date: 07/17/2021   ID: VI WHITESEL, DOB 08-Dec-1950, MRN [id number]  PCP:  Caryl Bis, MD  Cardiologist:  Rozann Lesches, MD Electrophysiologist:  None   Chief Complaint  Patient presents with   Cardiac follow-up    History of Present Illness: [name] is a 70 y.o. male last seen in June.  He is here today with his wife for a follow-up visit.  He does not report any obvious angina symptoms since last encounter.  Continues to undergo hemodialysis on Tuesdays, Thursdays, and Saturdays, does not report any regularly truncated sessions.  He continues to follow with Dr. Quillian Quince and has follow-up lab work and visit pending.  He states that he has been taking his medications as outlined below.  His last LDL was 12.  Past Medical History:  Diagnosis Date   Anemia    Anxiety    Arthritis    CAD (coronary artery disease)    Myoview August 2021 with evidence of large inferior scar but no active ischemia   COPD (chronic obstructive pulmonary disease) (HCC)    Depression    ESRD on hemodialysis (HCC)    Essential hypertension    GERD (gastroesophageal reflux disease)    H/O hiatal hernia    Headache(784.0)    History of kidney stones    History of pneumonia    Hypothyroidism    Neuropathy    Non-healing wound of amputation stump (HCC)    Right   Peripheral vascular disease (HCC)    PTSD (post-traumatic stress disorder)    Type 2 diabetes mellitus ([place])     Past Surgical History:  Procedure Laterality Date   A/V FISTULAGRAM Left 01/16/2019   Procedure: A/V FISTULAGRAM;  Surgeon: [name], MD;  Location: [facility];  Service: Cardiovascular;  Laterality: Left;   ABDOMINAL AORTOGRAM W/LOWER EXTREMITY Bilateral 03/18/2020   Procedure: ABDOMINAL AORTOGRAM W/LOWER EXTREMITY;  Surgeon: [name], MD;  Location: [facility] [facility];  Service: Cardiovascular;  Laterality: Bilateral;   AMPUTATION Right 12/01/2018   Procedure:  RIGHT AMPUTATION BELOW KNEE;  Surgeon: [name], MD;  Location: [place];  Service: Vascular;  Laterality: Right;   AMPUTATION Right 04/27/2019   Procedure: AMPUTATION BELOW KNEE REVISION;  Surgeon: [name], MD;  Location: [place];  Service: Vascular;  Laterality: Right;   AMPUTATION Right 04/30/2019   Procedure: AMPUTATION ABOVE KNEE - RIGHT;  Surgeon: Waynetta Sandy, MD;  Location: [place];  Service: Vascular;  Laterality: Right;   APPLICATION OF WOUND VAC Right 04/27/2019   Procedure: APPLICATION OF WOUND VAC;  Surgeon: [name], MD;  Location: [place];  Service: Vascular;  Laterality: Right;   AV FISTULA PLACEMENT  2012      left arm    AV FISTULA PLACEMENT Left 12/18/2012   Procedure: ARTERIOVENOUS (AV) FISTULA CREATION;  Surgeon: Angelia Mould, MD;  Location: [place];  Service: Vascular;  Laterality: Left;   COLONOSCOPY  10/26/2011   Procedure: COLONOSCOPY;  Surgeon: Rogene Houston, MD;  [facility];  Service: Endoscopy;  Laterality: N/A;  730   EMBOLECTOMY Left 12/09/2012   Procedure: EMBOLECTOMY;  Surgeon: Serafina Mitchell, MD;  Location: [facility] LAB;  Service: Cardiovascular;  Laterality: Left;  left arm venous embolization   ENDARTERECTOMY Left 10/24/2020   Procedure: LEFT CAROTID ENDARTERECTOMY;  Surgeon: [name], MD;  Location: [place];  Service: Vascular;  Laterality: Left;  ENDARTERECTOMY FEMORAL Left 04/04/2020   Procedure: ENDARTERECTOMY COMMON FEMORAL;  Surgeon: [name], MD;  Location: [facility] OR;  Service: Vascular;  Laterality: Left;   ESOPHAGOGASTRODUODENOSCOPY (EGD) WITH ESOPHAGEAL DILATION N/A 04/23/2013   Procedure: ESOPHAGOGASTRODUODENOSCOPY (EGD) WITH ESOPHAGEAL DILATION;  Surgeon: Rogene Houston, MD;  [facility];  Service: Endoscopy;  Laterality: N/A;  200-moved to 930    FEMORAL-POPLITEAL BYPASS GRAFT Left 04/04/2020   Procedure: FEMORAL- BELOW KNEE POPLITEAL BYPASS GRAFT NON REVERSED VEIN;  Surgeon:  [name], MD;  Location: [place];  Service: Vascular;  Laterality: Left;   FISTULA SUPERFICIALIZATION Left 06/18/2013   Procedure: FISTULA SUPERFICIALIZATION & LIGATION BRANCH X 1;  Surgeon: Mal Misty, MD;  Location: [place];  Service: Vascular;  Laterality: Left;   GROIN DEBRIDEMENT Left 04/29/2020   Procedure: EXPLORATION LEFT GROIN WITH DEBRIDEMENT;  Surgeon: Rosetta Posner, MD;  Location: [place];  Service: Vascular;  Laterality: Left;   HEMATOMA EVACUATION Right 02/11/2017   Procedure: EVACUATION HEMATOMA RIGHT GROIN, Repair of Right Pseudo-anerysm.;  Surgeon: [name], MD;  Location: [place];  Service: Vascular;  Laterality: Right;   INGUINAL HERNIA REPAIR     ,  times   2   INSERTION OF DIALYSIS CATHETER Left 12/18/2012   Procedure: INSERTION OF DIALYSIS CATHETER;  Surgeon: Angelia Mould, MD;  Location: [place];  Service: Vascular;  Laterality: Left;   KNEE ARTHROSCOPY  2011   Right Knee   LOWER EXTREMITY ANGIOGRAPHY N/A 02/11/2017   Procedure: Lower Extremity Angiography;  Surgeon: Lorretta Harp, MD;  Location: [place] [facility];  Service: Cardiovascular;  Laterality: N/A;   PERIPHERAL ATHRECTOMY  02/11/2017   PERIPHERAL VASCULAR ATHERECTOMY Left 02/11/2017   Procedure: Peripheral Vascular Atherectomy;  Surgeon: Lorretta Harp, MD;  Location: [place] [facility];  Service: Cardiovascular;  Laterality: Left;   PERIPHERAL VASCULAR BALLOON ANGIOPLASTY Left 01/16/2019   Procedure: PERIPHERAL VASCULAR BALLOON ANGIOPLASTY;  Surgeon: [name], MD;  Location: [facility] [facility];  Service: Cardiovascular;  Laterality: Left;  central vein   REVISON OF ARTERIOVENOUS FISTULA Left 01/08/4131   Procedure: PLICATION OF LEFT BRACHIOCEPHALIC ARTERIOVENOUS FISTULA;  Surgeon: Conrad Camilla, MD;  Location: [place];  Service: Vascular;  Laterality: Left;   SHUNTOGRAM N/A 12/09/2012   Procedure: fistulogram;  Surgeon: Serafina Mitchell, MD;  Location: [facility];  Service: Cardiovascular;   Laterality: N/A;   SHUNTOGRAM Left 06/03/2013   Procedure: Fistulogram;  Surgeon: Serafina Mitchell, MD;  Location: [facility] LAB;  Service: Cardiovascular;  Laterality: Left;   THROMBECTOMY W/ EMBOLECTOMY Left 12/11/2012   Procedure: THROMBECTOMY ARTERIOVENOUS FISTULA;  Surgeon: Serafina Mitchell, MD;  Location: [place];  Service: Vascular;  Laterality: Left;   TONSILLECTOMY     WOUND DEBRIDEMENT Right 02/11/2019   Procedure: DEBRIDEMENT WOUND RIGHT BELOW THE KNEE STUMP;  Surgeon: Serafina Mitchell, MD;  Location: [facility];  Service: Vascular;  Laterality: Right;    Current Outpatient Medications  Medication Sig Dispense Refill   albuterol (VENTOLIN HFA) 108 (90 Base) MCG/ACT inhaler Inhale 2 puffs into the lungs every 6 (six) hours as needed for wheezing or shortness of breath.      ALPRAZolam (XANAX) 0.5 MG tablet Take 0.5 mg by mouth 2 (two) times daily as needed for anxiety.     ARTIFICIAL TEAR OP Place 1 drop into both eyes every 6 (six) hours as needed (dry eyes).     atorvastatin (LIPITOR) 80 MG tablet  Take 1 tablet (80 mg total) by mouth at bedtime. (Patient taking differently: Take 80 mg by mouth daily.) 30 tablet 5   bismuth subsalicylate (PEPTO BISMOL) 262 MG/15ML suspension Take 30 mLs by mouth every 6 (six) hours as needed for indigestion.     calcium acetate (PHOSLO) 667 MG capsule Take 4 capsules (2,668 mg total) by mouth 3 (three) times daily with meals. (Patient taking differently: Take 667-1,334 mg by mouth See admin instructions. Take 1334 mg with meals and 667 mg with each snack) 360 capsule 0   cinacalcet (SENSIPAR) 30 MG tablet Take 1 tablet (30 mg total) by mouth every evening. 60 tablet 0   clopidogrel (PLAVIX) 75 MG tablet Take 1 tablet (75 mg total) by mouth daily. 30 tablet 0   cyclobenzaprine (FLEXERIL) 10 MG tablet Take 10 mg by mouth 3 (three) times daily as needed for muscle spasms.     diphenhydrAMINE (BENADRYL) 25 MG tablet Take 25 mg by mouth daily as needed for itching.      GLOBAL EASE INJECT PEN NEEDLES 32G X 4 MM MISC TO BE USED DAILY WITH INSULIN     ibuprofen (ADVIL) 200 MG tablet Take 400 mg by mouth in the morning and at bedtime.     levothyroxine (SYNTHROID) 125 MCG tablet Take 1 tablet (125 mcg total) by mouth daily before breakfast. 30 tablet 0   lisinopril (ZESTRIL) 20 MG tablet Take 1 tablet by mouth daily.     multivitamin (RENA-VIT) TABS tablet Take 1 tablet by mouth daily.     NONFORMULARY OR COMPOUNDED ITEM Ketamine 10%, Baclofen 2%, Cyclobenzaprine 2%, Ketoprofen 10%, Gabapentin 6%, Bupivacaine 1%, Amitryptiline 5%, clonidine 0.2%  Dispense 240 g  Apply 1-2 g to affected area 3-4 times per day 1 each 1   ondansetron (ZOFRAN-ODT) 4 MG disintegrating tablet 4 mg as needed.     Oxycodone HCl 10 MG TABS Take 10 mg by mouth 3 (three) times daily.     polyethylene glycol (MIRALAX / GLYCOLAX) 17 g packet Take 17 g by mouth daily.     pregabalin (LYRICA) 150 MG capsule Take 1 capsule (150 mg total) by mouth 3 (three) times daily. 90 capsule 0   prochlorperazine (COMPAZINE) 25 MG suppository Place 1 suppository (25 mg total) rectally every 12 (twelve) hours as needed for nausea. 6 suppository 0   senna (SENOKOT) 8.6 MG tablet Take 1 tablet by mouth as needed for constipation.     sevelamer carbonate (RENVELA) 800 MG tablet Take 800-1,600 mg by mouth See admin instructions. Take 1600 mg with each meal and 800 mg with each snack     TOUJEO SOLOSTAR 300 UNIT/ML SOPN Inject 40-78 Units into the skin at bedtime as needed (if blood sugar is 130 or higher).     Current Facility-Administered Medications  Medication Dose Route Frequency Provider Last Rate Last Admin   0.9 %  sodium chloride infusion  250 mL Intravenous PRN [name], MD       Allergies:  Ace inhibitors, Clonidine, Morphine and related, and Oxytocin   ROS: No palpitations or syncope.  Physical Exam: VS:  BP 126/72   Pulse 72   Ht 6\' 4"  (1.93 m)   Wt 238 lb (108 kg)   BMI 28.97 kg/m  , BMI Body mass index is 28.97 kg/m.  Wt Readings from Last 3 Encounters:  07/17/21 238 lb (108 kg)  01/11/21 238 lb (108 kg)  12/28/20 238 lb 8.6 oz (108.2 kg)  General: Appears comfortable, seated in wheelchair. HEENT: Conjunctiva and lids normal, wearing a mask. Neck: Supple, no elevated JVP or carotid bruits, no thyromegaly. Lungs: Clear to auscultation, nonlabored breathing at rest. Cardiac: Regular rate and rhythm, no S3, 1/6 systolic murmur, no pericardial rub. Extremities: AV fistula left arm, status post right AKA.  ECG:  An ECG dated 01/11/2021 was personally reviewed today and demonstrated:  Sinus rhythm with prolonged PR interval, leftward axis, right bundle branch block.  Recent Labwork: 10/20/2020: ALT 24; AST 29 10/25/2020: BUN 40; Creatinine, Ser 8.85; Hemoglobin 8.1; Platelets 127; Potassium 4.2; Sodium 139     Component Value Date/Time   CHOL 60 04/05/2020 0428   TRIG 155 (H) 04/05/2020 0428   HDL 17 (L) 04/05/2020 0428   CHOLHDL 3.5 04/05/2020 0428   VLDL 31 04/05/2020 0428   LDLCALC 12 04/05/2020 0428    Other Studies Reviewed Today:  [facility] Myoview 04/01/2020: The left ventricular ejection fraction is moderately decreased (30-44%). Nuclear stress EF: 39%. No T wave inversion was noted during stress. There was no ST segment deviation noted during stress. Defect 1: There is a large defect of severe severity present in the mid inferior and apical inferior location. Findings consistent with prior myocardial infarction. This is an intermediate risk study.   Large size, severe severity fixed inferior perfusion defect consistent with infarct/scar or much less likely artifact related to RBBB. No significant reversible ischemia. LVEF 39% with basal to mid inferior akinesis. This is an intermediate risk study.   Echocardiogram 02/02/2020:  1. Left ventricular ejection fraction, by estimation, is 55 to 60%. The  left ventricle has normal function. The left  ventricle has no regional  wall motion abnormalities. The left ventricular internal cavity size was  mildly dilated. There is moderate  left ventricular hypertrophy. Left ventricular diastolic parameters are  consistent with Grade I diastolic dysfunction (impaired relaxation).   2. Right ventricular systolic function is normal. The right ventricular  size is normal. There is normal pulmonary artery systolic pressure. The  estimated right ventricular systolic pressure is 16.0 mmHg.   3. Left atrial size was mildly dilated.   4. The mitral valve is grossly normal, mildly calcified annulus. Trivial  mitral valve regurgitation.   5. The aortic valve is tricuspid. Aortic valve regurgitation is not  visualized. Mild aortic valve stenosis. Aortic valve area, by VTI measures  1.49 cm. Aortic valve mean gradient measures 13.0 mmHg.   6. The inferior vena cava is normal in size with greater than 50%  respiratory variability, suggesting right atrial pressure of 3 mmHg.   Assessment and Plan:  1.  CAD based on noninvasive imaging studies outlined above with plan to continue medical therapy in the absence of accelerating angina.  He has evidence of previous inferior wall infarct but preserved LVEF by echocardiogram.  Continue Plavix, Lipitor, and lisinopril.  2.  ESRD on hemodialysis.  3.  Mixed hyperlipidemia tolerating high-dose Lipitor.  Medication Adjustments/Labs and Tests Ordered: Current medicines are reviewed at length with the patient today.  Concerns regarding medicines are outlined above.   Tests Ordered: No orders of the defined types were placed in this encounter.   Medication Changes: No orders of the defined types were placed in this encounter.   Disposition:  Follow up  6 months.  Signed, Satira Sark, MD, [facility] 07/17/2021 11:22 AM    [facility] [place], [place], [place] [id number] Phone: [phone]; Fax: [phone]

## 2021-07-18 DIAGNOSIS — D509 Iron deficiency anemia, unspecified: Secondary | ICD-10-CM | POA: Diagnosis not present

## 2021-07-18 DIAGNOSIS — D631 Anemia in chronic kidney disease: Secondary | ICD-10-CM | POA: Diagnosis not present

## 2021-07-18 DIAGNOSIS — N2581 Secondary hyperparathyroidism of renal origin: Secondary | ICD-10-CM | POA: Diagnosis not present

## 2021-07-18 DIAGNOSIS — Z992 Dependence on renal dialysis: Secondary | ICD-10-CM | POA: Diagnosis not present

## 2021-07-18 DIAGNOSIS — N186 End stage renal disease: Secondary | ICD-10-CM | POA: Diagnosis not present

## 2021-07-20 DIAGNOSIS — D509 Iron deficiency anemia, unspecified: Secondary | ICD-10-CM | POA: Diagnosis not present

## 2021-07-20 DIAGNOSIS — Z992 Dependence on renal dialysis: Secondary | ICD-10-CM | POA: Diagnosis not present

## 2021-07-20 DIAGNOSIS — N186 End stage renal disease: Secondary | ICD-10-CM | POA: Diagnosis not present

## 2021-07-20 DIAGNOSIS — D631 Anemia in chronic kidney disease: Secondary | ICD-10-CM | POA: Diagnosis not present

## 2021-07-20 DIAGNOSIS — N2581 Secondary hyperparathyroidism of renal origin: Secondary | ICD-10-CM | POA: Diagnosis not present

## 2021-07-22 DIAGNOSIS — D509 Iron deficiency anemia, unspecified: Secondary | ICD-10-CM | POA: Diagnosis not present

## 2021-07-22 DIAGNOSIS — N186 End stage renal disease: Secondary | ICD-10-CM | POA: Diagnosis not present

## 2021-07-22 DIAGNOSIS — N2581 Secondary hyperparathyroidism of renal origin: Secondary | ICD-10-CM | POA: Diagnosis not present

## 2021-07-22 DIAGNOSIS — D631 Anemia in chronic kidney disease: Secondary | ICD-10-CM | POA: Diagnosis not present

## 2021-07-22 DIAGNOSIS — Z992 Dependence on renal dialysis: Secondary | ICD-10-CM | POA: Diagnosis not present

## 2021-07-25 DIAGNOSIS — N186 End stage renal disease: Secondary | ICD-10-CM | POA: Diagnosis not present

## 2021-07-25 DIAGNOSIS — D509 Iron deficiency anemia, unspecified: Secondary | ICD-10-CM | POA: Diagnosis not present

## 2021-07-25 DIAGNOSIS — Z992 Dependence on renal dialysis: Secondary | ICD-10-CM | POA: Diagnosis not present

## 2021-07-25 DIAGNOSIS — N2581 Secondary hyperparathyroidism of renal origin: Secondary | ICD-10-CM | POA: Diagnosis not present

## 2021-07-25 DIAGNOSIS — D631 Anemia in chronic kidney disease: Secondary | ICD-10-CM | POA: Diagnosis not present

## 2021-07-28 DIAGNOSIS — N2581 Secondary hyperparathyroidism of renal origin: Secondary | ICD-10-CM | POA: Diagnosis not present

## 2021-07-28 DIAGNOSIS — Z992 Dependence on renal dialysis: Secondary | ICD-10-CM | POA: Diagnosis not present

## 2021-07-28 DIAGNOSIS — D509 Iron deficiency anemia, unspecified: Secondary | ICD-10-CM | POA: Diagnosis not present

## 2021-07-28 DIAGNOSIS — N186 End stage renal disease: Secondary | ICD-10-CM | POA: Diagnosis not present

## 2021-07-28 DIAGNOSIS — D631 Anemia in chronic kidney disease: Secondary | ICD-10-CM | POA: Diagnosis not present

## 2021-07-29 DIAGNOSIS — D631 Anemia in chronic kidney disease: Secondary | ICD-10-CM | POA: Diagnosis not present

## 2021-07-29 DIAGNOSIS — N186 End stage renal disease: Secondary | ICD-10-CM | POA: Diagnosis not present

## 2021-07-29 DIAGNOSIS — D509 Iron deficiency anemia, unspecified: Secondary | ICD-10-CM | POA: Diagnosis not present

## 2021-07-29 DIAGNOSIS — N2581 Secondary hyperparathyroidism of renal origin: Secondary | ICD-10-CM | POA: Diagnosis not present

## 2021-07-29 DIAGNOSIS — Z992 Dependence on renal dialysis: Secondary | ICD-10-CM | POA: Diagnosis not present

## 2021-07-31 DIAGNOSIS — F17210 Nicotine dependence, cigarettes, uncomplicated: Secondary | ICD-10-CM | POA: Diagnosis not present

## 2021-07-31 DIAGNOSIS — R531 Weakness: Secondary | ICD-10-CM | POA: Diagnosis not present

## 2021-07-31 DIAGNOSIS — L03116 Cellulitis of left lower limb: Secondary | ICD-10-CM | POA: Diagnosis not present

## 2021-08-01 DIAGNOSIS — N2581 Secondary hyperparathyroidism of renal origin: Secondary | ICD-10-CM | POA: Diagnosis not present

## 2021-08-01 DIAGNOSIS — D631 Anemia in chronic kidney disease: Secondary | ICD-10-CM | POA: Diagnosis not present

## 2021-08-01 DIAGNOSIS — D509 Iron deficiency anemia, unspecified: Secondary | ICD-10-CM | POA: Diagnosis not present

## 2021-08-01 DIAGNOSIS — Z992 Dependence on renal dialysis: Secondary | ICD-10-CM | POA: Diagnosis not present

## 2021-08-01 DIAGNOSIS — N186 End stage renal disease: Secondary | ICD-10-CM | POA: Diagnosis not present

## 2021-08-03 ENCOUNTER — Telehealth: Payer: Self-pay

## 2021-08-03 DIAGNOSIS — D509 Iron deficiency anemia, unspecified: Secondary | ICD-10-CM | POA: Diagnosis not present

## 2021-08-03 DIAGNOSIS — D631 Anemia in chronic kidney disease: Secondary | ICD-10-CM | POA: Diagnosis not present

## 2021-08-03 DIAGNOSIS — N2581 Secondary hyperparathyroidism of renal origin: Secondary | ICD-10-CM | POA: Diagnosis not present

## 2021-08-03 DIAGNOSIS — N186 End stage renal disease: Secondary | ICD-10-CM | POA: Diagnosis not present

## 2021-08-03 DIAGNOSIS — Z992 Dependence on renal dialysis: Secondary | ICD-10-CM | POA: Diagnosis not present

## 2021-08-03 NOTE — Telephone Encounter (Signed)
Ramiro [facility] PA calls today to schedule patient for his 9 month follow up appt. He has some little sores on his left leg - no issues on his feet or pain. Moved up his follow up to a couple weeks. Advised to have patient call back if pain or foot wounds developed.

## 2021-08-08 DIAGNOSIS — D509 Iron deficiency anemia, unspecified: Secondary | ICD-10-CM | POA: Diagnosis not present

## 2021-08-08 DIAGNOSIS — Z794 Long term (current) use of insulin: Secondary | ICD-10-CM | POA: Diagnosis not present

## 2021-08-08 DIAGNOSIS — N186 End stage renal disease: Secondary | ICD-10-CM | POA: Diagnosis not present

## 2021-08-08 DIAGNOSIS — Z992 Dependence on renal dialysis: Secondary | ICD-10-CM | POA: Diagnosis not present

## 2021-08-08 DIAGNOSIS — N2581 Secondary hyperparathyroidism of renal origin: Secondary | ICD-10-CM | POA: Diagnosis not present

## 2021-08-08 DIAGNOSIS — D631 Anemia in chronic kidney disease: Secondary | ICD-10-CM | POA: Diagnosis not present

## 2021-08-08 DIAGNOSIS — E119 Type 2 diabetes mellitus without complications: Secondary | ICD-10-CM | POA: Diagnosis not present

## 2021-08-09 DIAGNOSIS — E7849 Other hyperlipidemia: Secondary | ICD-10-CM | POA: Diagnosis not present

## 2021-08-09 DIAGNOSIS — E782 Mixed hyperlipidemia: Secondary | ICD-10-CM | POA: Diagnosis not present

## 2021-08-09 DIAGNOSIS — N189 Chronic kidney disease, unspecified: Secondary | ICD-10-CM | POA: Diagnosis not present

## 2021-08-09 DIAGNOSIS — E039 Hypothyroidism, unspecified: Secondary | ICD-10-CM | POA: Diagnosis not present

## 2021-08-09 DIAGNOSIS — E11621 Type 2 diabetes mellitus with foot ulcer: Secondary | ICD-10-CM | POA: Diagnosis not present

## 2021-08-09 DIAGNOSIS — I10 Essential (primary) hypertension: Secondary | ICD-10-CM | POA: Diagnosis not present

## 2021-08-09 DIAGNOSIS — K2100 Gastro-esophageal reflux disease with esophagitis, without bleeding: Secondary | ICD-10-CM | POA: Diagnosis not present

## 2021-08-09 DIAGNOSIS — E1143 Type 2 diabetes mellitus with diabetic autonomic (poly)neuropathy: Secondary | ICD-10-CM | POA: Diagnosis not present

## 2021-08-10 DIAGNOSIS — N186 End stage renal disease: Secondary | ICD-10-CM | POA: Diagnosis not present

## 2021-08-10 DIAGNOSIS — Z992 Dependence on renal dialysis: Secondary | ICD-10-CM | POA: Diagnosis not present

## 2021-08-10 DIAGNOSIS — D631 Anemia in chronic kidney disease: Secondary | ICD-10-CM | POA: Diagnosis not present

## 2021-08-10 DIAGNOSIS — N2581 Secondary hyperparathyroidism of renal origin: Secondary | ICD-10-CM | POA: Diagnosis not present

## 2021-08-10 DIAGNOSIS — D509 Iron deficiency anemia, unspecified: Secondary | ICD-10-CM | POA: Diagnosis not present

## 2021-08-12 DIAGNOSIS — Z992 Dependence on renal dialysis: Secondary | ICD-10-CM | POA: Diagnosis not present

## 2021-08-12 DIAGNOSIS — D509 Iron deficiency anemia, unspecified: Secondary | ICD-10-CM | POA: Diagnosis not present

## 2021-08-12 DIAGNOSIS — N186 End stage renal disease: Secondary | ICD-10-CM | POA: Diagnosis not present

## 2021-08-12 DIAGNOSIS — D631 Anemia in chronic kidney disease: Secondary | ICD-10-CM | POA: Diagnosis not present

## 2021-08-12 DIAGNOSIS — N2581 Secondary hyperparathyroidism of renal origin: Secondary | ICD-10-CM | POA: Diagnosis not present

## 2021-08-13 DIAGNOSIS — D509 Iron deficiency anemia, unspecified: Secondary | ICD-10-CM | POA: Diagnosis not present

## 2021-08-13 DIAGNOSIS — N2581 Secondary hyperparathyroidism of renal origin: Secondary | ICD-10-CM | POA: Diagnosis not present

## 2021-08-13 DIAGNOSIS — Z992 Dependence on renal dialysis: Secondary | ICD-10-CM | POA: Diagnosis not present

## 2021-08-13 DIAGNOSIS — D631 Anemia in chronic kidney disease: Secondary | ICD-10-CM | POA: Diagnosis not present

## 2021-08-13 DIAGNOSIS — N186 End stage renal disease: Secondary | ICD-10-CM | POA: Diagnosis not present

## 2021-08-15 DIAGNOSIS — N2581 Secondary hyperparathyroidism of renal origin: Secondary | ICD-10-CM | POA: Diagnosis not present

## 2021-08-15 DIAGNOSIS — Z992 Dependence on renal dialysis: Secondary | ICD-10-CM | POA: Diagnosis not present

## 2021-08-15 DIAGNOSIS — D509 Iron deficiency anemia, unspecified: Secondary | ICD-10-CM | POA: Diagnosis not present

## 2021-08-15 DIAGNOSIS — D631 Anemia in chronic kidney disease: Secondary | ICD-10-CM | POA: Diagnosis not present

## 2021-08-15 DIAGNOSIS — Z20822 Contact with and (suspected) exposure to covid-19: Secondary | ICD-10-CM | POA: Diagnosis not present

## 2021-08-15 DIAGNOSIS — N186 End stage renal disease: Secondary | ICD-10-CM | POA: Diagnosis not present

## 2021-08-17 DIAGNOSIS — N2581 Secondary hyperparathyroidism of renal origin: Secondary | ICD-10-CM | POA: Diagnosis not present

## 2021-08-17 DIAGNOSIS — D509 Iron deficiency anemia, unspecified: Secondary | ICD-10-CM | POA: Diagnosis not present

## 2021-08-17 DIAGNOSIS — Z992 Dependence on renal dialysis: Secondary | ICD-10-CM | POA: Diagnosis not present

## 2021-08-17 DIAGNOSIS — N186 End stage renal disease: Secondary | ICD-10-CM | POA: Diagnosis not present

## 2021-08-17 DIAGNOSIS — D631 Anemia in chronic kidney disease: Secondary | ICD-10-CM | POA: Diagnosis not present

## 2021-08-18 DIAGNOSIS — Z992 Dependence on renal dialysis: Secondary | ICD-10-CM | POA: Diagnosis not present

## 2021-08-18 DIAGNOSIS — I700 Atherosclerosis of aorta: Secondary | ICD-10-CM | POA: Diagnosis not present

## 2021-08-18 DIAGNOSIS — Z0001 Encounter for general adult medical examination with abnormal findings: Secondary | ICD-10-CM | POA: Diagnosis not present

## 2021-08-18 DIAGNOSIS — E1143 Type 2 diabetes mellitus with diabetic autonomic (poly)neuropathy: Secondary | ICD-10-CM | POA: Diagnosis not present

## 2021-08-18 DIAGNOSIS — I10 Essential (primary) hypertension: Secondary | ICD-10-CM | POA: Diagnosis not present

## 2021-08-18 DIAGNOSIS — J440 Chronic obstructive pulmonary disease with acute lower respiratory infection: Secondary | ICD-10-CM | POA: Diagnosis not present

## 2021-08-18 DIAGNOSIS — F17200 Nicotine dependence, unspecified, uncomplicated: Secondary | ICD-10-CM | POA: Diagnosis not present

## 2021-08-18 DIAGNOSIS — I739 Peripheral vascular disease, unspecified: Secondary | ICD-10-CM | POA: Diagnosis not present

## 2021-08-19 DIAGNOSIS — N2581 Secondary hyperparathyroidism of renal origin: Secondary | ICD-10-CM | POA: Diagnosis not present

## 2021-08-19 DIAGNOSIS — D631 Anemia in chronic kidney disease: Secondary | ICD-10-CM | POA: Diagnosis not present

## 2021-08-19 DIAGNOSIS — N186 End stage renal disease: Secondary | ICD-10-CM | POA: Diagnosis not present

## 2021-08-19 DIAGNOSIS — D509 Iron deficiency anemia, unspecified: Secondary | ICD-10-CM | POA: Diagnosis not present

## 2021-08-19 DIAGNOSIS — Z992 Dependence on renal dialysis: Secondary | ICD-10-CM | POA: Diagnosis not present

## 2021-08-21 ENCOUNTER — Encounter (INDEPENDENT_AMBULATORY_CARE_PROVIDER_SITE_OTHER): Payer: Self-pay | Admitting: *Deleted

## 2021-08-22 DIAGNOSIS — N2581 Secondary hyperparathyroidism of renal origin: Secondary | ICD-10-CM | POA: Diagnosis not present

## 2021-08-22 DIAGNOSIS — N186 End stage renal disease: Secondary | ICD-10-CM | POA: Diagnosis not present

## 2021-08-22 DIAGNOSIS — D509 Iron deficiency anemia, unspecified: Secondary | ICD-10-CM | POA: Diagnosis not present

## 2021-08-22 DIAGNOSIS — Z992 Dependence on renal dialysis: Secondary | ICD-10-CM | POA: Diagnosis not present

## 2021-08-22 DIAGNOSIS — D631 Anemia in chronic kidney disease: Secondary | ICD-10-CM | POA: Diagnosis not present

## 2021-08-24 DIAGNOSIS — N2581 Secondary hyperparathyroidism of renal origin: Secondary | ICD-10-CM | POA: Diagnosis not present

## 2021-08-24 DIAGNOSIS — D631 Anemia in chronic kidney disease: Secondary | ICD-10-CM | POA: Diagnosis not present

## 2021-08-24 DIAGNOSIS — D509 Iron deficiency anemia, unspecified: Secondary | ICD-10-CM | POA: Diagnosis not present

## 2021-08-24 DIAGNOSIS — N186 End stage renal disease: Secondary | ICD-10-CM | POA: Diagnosis not present

## 2021-08-24 DIAGNOSIS — Z992 Dependence on renal dialysis: Secondary | ICD-10-CM | POA: Diagnosis not present

## 2021-08-26 DIAGNOSIS — N186 End stage renal disease: Secondary | ICD-10-CM | POA: Diagnosis not present

## 2021-08-26 DIAGNOSIS — Z992 Dependence on renal dialysis: Secondary | ICD-10-CM | POA: Diagnosis not present

## 2021-08-26 DIAGNOSIS — N2581 Secondary hyperparathyroidism of renal origin: Secondary | ICD-10-CM | POA: Diagnosis not present

## 2021-08-26 DIAGNOSIS — D509 Iron deficiency anemia, unspecified: Secondary | ICD-10-CM | POA: Diagnosis not present

## 2021-08-26 DIAGNOSIS — D631 Anemia in chronic kidney disease: Secondary | ICD-10-CM | POA: Diagnosis not present

## 2021-08-27 DIAGNOSIS — N2581 Secondary hyperparathyroidism of renal origin: Secondary | ICD-10-CM | POA: Diagnosis not present

## 2021-08-27 DIAGNOSIS — N186 End stage renal disease: Secondary | ICD-10-CM | POA: Diagnosis not present

## 2021-08-27 DIAGNOSIS — D509 Iron deficiency anemia, unspecified: Secondary | ICD-10-CM | POA: Diagnosis not present

## 2021-08-27 DIAGNOSIS — D631 Anemia in chronic kidney disease: Secondary | ICD-10-CM | POA: Diagnosis not present

## 2021-08-27 DIAGNOSIS — Z992 Dependence on renal dialysis: Secondary | ICD-10-CM | POA: Diagnosis not present

## 2021-08-29 DIAGNOSIS — N186 End stage renal disease: Secondary | ICD-10-CM | POA: Diagnosis not present

## 2021-08-29 DIAGNOSIS — N2581 Secondary hyperparathyroidism of renal origin: Secondary | ICD-10-CM | POA: Diagnosis not present

## 2021-08-29 DIAGNOSIS — D509 Iron deficiency anemia, unspecified: Secondary | ICD-10-CM | POA: Diagnosis not present

## 2021-08-29 DIAGNOSIS — Z992 Dependence on renal dialysis: Secondary | ICD-10-CM | POA: Diagnosis not present

## 2021-08-29 DIAGNOSIS — D631 Anemia in chronic kidney disease: Secondary | ICD-10-CM | POA: Diagnosis not present

## 2021-08-31 DIAGNOSIS — Z992 Dependence on renal dialysis: Secondary | ICD-10-CM | POA: Diagnosis not present

## 2021-08-31 DIAGNOSIS — N186 End stage renal disease: Secondary | ICD-10-CM | POA: Diagnosis not present

## 2021-08-31 DIAGNOSIS — D631 Anemia in chronic kidney disease: Secondary | ICD-10-CM | POA: Diagnosis not present

## 2021-08-31 DIAGNOSIS — N2581 Secondary hyperparathyroidism of renal origin: Secondary | ICD-10-CM | POA: Diagnosis not present

## 2021-08-31 DIAGNOSIS — D509 Iron deficiency anemia, unspecified: Secondary | ICD-10-CM | POA: Diagnosis not present

## 2021-09-02 DIAGNOSIS — N186 End stage renal disease: Secondary | ICD-10-CM | POA: Diagnosis not present

## 2021-09-02 DIAGNOSIS — D631 Anemia in chronic kidney disease: Secondary | ICD-10-CM | POA: Diagnosis not present

## 2021-09-02 DIAGNOSIS — D509 Iron deficiency anemia, unspecified: Secondary | ICD-10-CM | POA: Diagnosis not present

## 2021-09-02 DIAGNOSIS — Z992 Dependence on renal dialysis: Secondary | ICD-10-CM | POA: Diagnosis not present

## 2021-09-02 DIAGNOSIS — N2581 Secondary hyperparathyroidism of renal origin: Secondary | ICD-10-CM | POA: Diagnosis not present

## 2021-09-05 DIAGNOSIS — D631 Anemia in chronic kidney disease: Secondary | ICD-10-CM | POA: Diagnosis not present

## 2021-09-05 DIAGNOSIS — N186 End stage renal disease: Secondary | ICD-10-CM | POA: Diagnosis not present

## 2021-09-05 DIAGNOSIS — Z992 Dependence on renal dialysis: Secondary | ICD-10-CM | POA: Diagnosis not present

## 2021-09-05 DIAGNOSIS — D509 Iron deficiency anemia, unspecified: Secondary | ICD-10-CM | POA: Diagnosis not present

## 2021-09-05 DIAGNOSIS — N2581 Secondary hyperparathyroidism of renal origin: Secondary | ICD-10-CM | POA: Diagnosis not present

## 2021-09-06 ENCOUNTER — Encounter (HOSPITAL_COMMUNITY): Payer: Medicare Other

## 2021-09-06 ENCOUNTER — Ambulatory Visit: Payer: Medicare Other

## 2021-09-06 ENCOUNTER — Other Ambulatory Visit (HOSPITAL_COMMUNITY): Payer: Medicare Other

## 2021-09-07 DIAGNOSIS — N2581 Secondary hyperparathyroidism of renal origin: Secondary | ICD-10-CM | POA: Diagnosis not present

## 2021-09-07 DIAGNOSIS — N186 End stage renal disease: Secondary | ICD-10-CM | POA: Diagnosis not present

## 2021-09-07 DIAGNOSIS — D509 Iron deficiency anemia, unspecified: Secondary | ICD-10-CM | POA: Diagnosis not present

## 2021-09-07 DIAGNOSIS — Z992 Dependence on renal dialysis: Secondary | ICD-10-CM | POA: Diagnosis not present

## 2021-09-07 DIAGNOSIS — D631 Anemia in chronic kidney disease: Secondary | ICD-10-CM | POA: Diagnosis not present

## 2021-09-08 ENCOUNTER — Ambulatory Visit (HOSPITAL_COMMUNITY): Payer: Medicare Other | Attending: Family Medicine | Admitting: Physical Therapy

## 2021-09-08 ENCOUNTER — Other Ambulatory Visit: Payer: Self-pay

## 2021-09-08 ENCOUNTER — Encounter (HOSPITAL_COMMUNITY): Payer: Self-pay | Admitting: Physical Therapy

## 2021-09-08 DIAGNOSIS — M6281 Muscle weakness (generalized): Secondary | ICD-10-CM | POA: Diagnosis not present

## 2021-09-08 DIAGNOSIS — M79662 Pain in left lower leg: Secondary | ICD-10-CM | POA: Insufficient documentation

## 2021-09-08 DIAGNOSIS — S81802A Unspecified open wound, left lower leg, initial encounter: Secondary | ICD-10-CM | POA: Diagnosis not present

## 2021-09-08 NOTE — Therapy (Signed)
[place], [place], [address] Phone: [phone]   Fax:  [phone]  Wound Care Evaluation  Patient Details  Name: [name] MRN: [id number] Date of Birth: 1951-03-21 Referring Provider (PT): Gar Ponto   Encounter Date: 09/08/2021   PT End of Session - 09/08/21 1215     Visit Number 1    Number of Visits 8    Date for PT Re-Evaluation 10/08/21    Authorization Type medicare / BCBS    Progress Note Due on Visit 8    PT Start Time 0950    PT Stop Time 6606    PT Time Calculation (min) 45 min    Activity Tolerance Patient tolerated treatment well             Past Medical History:  Diagnosis Date   Anemia    Anxiety    Arthritis    CAD (coronary artery disease)    Myoview August 2021 with evidence of large inferior scar but no active ischemia   COPD (chronic obstructive pulmonary disease) ([place])    Depression    ESRD on hemodialysis ([place])    Essential hypertension    GERD (gastroesophageal reflux disease)    H/O hiatal hernia    Headache(784.0)    History of kidney stones    History of pneumonia    Hypothyroidism    Neuropathy    Non-healing wound of amputation stump (HCC)    Right   Peripheral vascular disease ([place])    PTSD (post-traumatic stress disorder)    Type 2 diabetes mellitus ([place])     Past Surgical History:  Procedure Laterality Date   A/V FISTULAGRAM Left 01/16/2019   Procedure: A/V FISTULAGRAM;  Surgeon: [name], MD;  Location: [facility];  Service: Cardiovascular;  Laterality: Left;   ABDOMINAL AORTOGRAM W/LOWER EXTREMITY Bilateral 03/18/2020   Procedure: ABDOMINAL AORTOGRAM W/LOWER EXTREMITY;  Surgeon: [name], MD;  Location: [facility];  Service: Cardiovascular;  Laterality: Bilateral;   AMPUTATION Right 12/01/2018   Procedure: RIGHT AMPUTATION BELOW KNEE;  Surgeon: [name], MD;  Location: [facility];  Service: Vascular;  Laterality:  Right;   AMPUTATION Right 04/27/2019   Procedure: AMPUTATION BELOW KNEE REVISION;  Surgeon: [name], MD;  Location: [facility];  Service: Vascular;  Laterality: Right;   AMPUTATION Right 04/30/2019   Procedure: AMPUTATION ABOVE KNEE - RIGHT;  Surgeon: Waynetta Sandy, MD;  Location: [facility];  Service: Vascular;  Laterality: Right;   APPLICATION OF WOUND VAC Right 04/27/2019   Procedure: APPLICATION OF WOUND VAC;  Surgeon: [name], MD;  Location: [facility];  Service: Vascular;  Laterality: Right;   AV FISTULA PLACEMENT  2012      left arm    AV FISTULA PLACEMENT Left 12/18/2012   Procedure: ARTERIOVENOUS (AV) FISTULA CREATION;  Surgeon: Angelia Mould, MD;  Location: [facility];  Service: Vascular;  Laterality: Left;   COLONOSCOPY  10/26/2011   Procedure: COLONOSCOPY;  Surgeon: Rogene Houston, MD;  [facility];  Service: Endoscopy;  Laterality: N/A;  730   EMBOLECTOMY Left 12/09/2012   Procedure: EMBOLECTOMY;  Surgeon: Serafina Mitchell, MD;  Location: [facility];  Service: Cardiovascular;  Laterality: Left;  left arm venous embolization   ENDARTERECTOMY Left 10/24/2020   Procedure: LEFT CAROTID ENDARTERECTOMY;  Surgeon: [name], MD;  Location: [facility];  Service: Vascular;  Laterality: Left;  ENDARTERECTOMY FEMORAL Left 04/04/2020   Procedure: ENDARTERECTOMY COMMON FEMORAL;  Surgeon: [name], MD;  Location: [facility];  Service: Vascular;  Laterality: Left;   ESOPHAGOGASTRODUODENOSCOPY (EGD) WITH ESOPHAGEAL DILATION N/A 04/23/2013   Procedure: ESOPHAGOGASTRODUODENOSCOPY (EGD) WITH ESOPHAGEAL DILATION;  Surgeon: Rogene Houston, MD;  [facility];  Service: Endoscopy;  Laterality: N/A;  200-moved to 930    FEMORAL-POPLITEAL BYPASS GRAFT Left 04/04/2020   Procedure: FEMORAL- BELOW KNEE POPLITEAL BYPASS GRAFT NON REVERSED VEIN;  Surgeon: [name], MD;  Location: [facility];  Service: Vascular;  Laterality: Left;   FISTULA SUPERFICIALIZATION Left  06/18/2013   Procedure: FISTULA SUPERFICIALIZATION & LIGATION BRANCH X 1;  Surgeon: Mal Misty, MD;  Location: [facility];  Service: Vascular;  Laterality: Left;   GROIN DEBRIDEMENT Left 04/29/2020   Procedure: EXPLORATION LEFT GROIN WITH DEBRIDEMENT;  Surgeon: Rosetta Posner, MD;  Location: [facility];  Service: Vascular;  Laterality: Left;   HEMATOMA EVACUATION Right 02/11/2017   Procedure: EVACUATION HEMATOMA RIGHT GROIN, Repair of Right Pseudo-anerysm.;  Surgeon: [name], MD;  Location: [facility];  Service: Vascular;  Laterality: Right;   INGUINAL HERNIA REPAIR     ,  times   2   INSERTION OF DIALYSIS CATHETER Left 12/18/2012   Procedure: INSERTION OF DIALYSIS CATHETER;  Surgeon: Angelia Mould, MD;  Location: [facility];  Service: Vascular;  Laterality: Left;   KNEE ARTHROSCOPY  2011   Right Knee   LOWER EXTREMITY ANGIOGRAPHY N/A 02/11/2017   Procedure: Lower Extremity Angiography;  Surgeon: Lorretta Harp, MD;  Location: [facility];  Service: Cardiovascular;  Laterality: N/A;   PERIPHERAL ATHRECTOMY  02/11/2017   PERIPHERAL VASCULAR ATHERECTOMY Left 02/11/2017   Procedure: Peripheral Vascular Atherectomy;  Surgeon: Lorretta Harp, MD;  Location: [facility];  Service: Cardiovascular;  Laterality: Left;   PERIPHERAL VASCULAR BALLOON ANGIOPLASTY Left 01/16/2019   Procedure: PERIPHERAL VASCULAR BALLOON ANGIOPLASTY;  Surgeon: [name], MD;  Location: [facility];  Service: Cardiovascular;  Laterality: Left;  central vein   REVISON OF ARTERIOVENOUS FISTULA Left 02/09/5283   Procedure: PLICATION OF LEFT BRACHIOCEPHALIC ARTERIOVENOUS FISTULA;  Surgeon: Conrad Hilltop, MD;  Location: [facility];  Service: Vascular;  Laterality: Left;   SHUNTOGRAM N/A 12/09/2012   Procedure: fistulogram;  Surgeon: Serafina Mitchell, MD;  Location: [facility] CATH LAB;  Service: Cardiovascular;  Laterality: N/A;   SHUNTOGRAM Left 06/03/2013   Procedure: Fistulogram;  Surgeon: Serafina Mitchell, MD;   Location: [facility];  Service: Cardiovascular;  Laterality: Left;   THROMBECTOMY W/ EMBOLECTOMY Left 12/11/2012   Procedure: THROMBECTOMY ARTERIOVENOUS FISTULA;  Surgeon: Serafina Mitchell, MD;  Location: [place];  Service: Vascular;  Laterality: Left;   TONSILLECTOMY     WOUND DEBRIDEMENT Right 02/11/2019   Procedure: DEBRIDEMENT WOUND RIGHT BELOW THE KNEE STUMP;  Surgeon: Serafina Mitchell, MD;  Location: [place];  Service: Vascular;  Laterality: Right;    There were no vitals filed for this visit.     [facility] PT Assessment - 09/08/21 0001       Assessment   Medical Diagnosis Multiple wounds LT LE    Referring Provider (PT) Gar Ponto    Onset Date/Surgical Date --   chronic 2018   Next MD Visit not schedule    Prior Therapy SNF / [facility]      Precautions   Precautions Other (comment)    Precaution Comments cellulitis  Balance Screen   Has the patient fallen in the past 6 months No   w/c bound   Has the patient had a decrease in activity level because of a fear of falling?  Yes    Is the patient reluctant to leave their home because of a fear of falling?  No             Wound Therapy - 09/08/21 0001     Subjective PT states that he has been having trouble with wounds on his Left leg since 2018, he has had treatement in a SNF, HH and self care.  States that he also has a wound on his buttock that he wants [place] to treat.  Therapist explained that he would need to get an order for this but the best thing he could do is get the pressure off of his buttock    Patient and Family Stated Goals wounds to heal    Date of Onset --   chronic   Prior Treatments as above    Pain Scale 0-10    Pain Score 6     Pain Type Chronic pain    Pain Location Leg    Pain Orientation Left    Patients Stated Pain Goal 0    Pain Intervention(s) Emotional support    Evaluation and Treatment Procedures Explained to Patient/Family Yes    Evaluation and Treatment Procedures agreed to    Wound Properties Date  First Assessed: 09/08/21 Time First Assessed: 1000 Wound Type: Non-pressure wound Location: Leg Location Orientation: Left Wound Description (Comments): 13 + wounds Present on Admission: Yes   Wound Image Images linked: 2    Dressing Type None    Site / Wound Assessment Dusky;Bleeding;Dry    % Wound base Red or Granulating 80%    % Wound base Yellow/Fibrinous Exudate 20%    Peri-wound Assessment Edema    Wound Length (cm) --   over 13 wounds of various sizes   Drainage Amount Scant    Drainage Description Serous;Sanguineous    Treatment Cleansed;Debridement (Selective)    Selective Debridement - Location wound beds    Selective Debridement - Tools Used Forceps    Selective Debridement - Tissue Removed scab, slough and devitalized tissue.    Wound Therapy - Clinical Statement Mr. Durbin is a 71 yo male referred to skilled PT for multiple wounds of his Lt LE>  He has 13+ wounds scattered from his distal thigh to his ankle area at this time.  Mr. Ritacco has hx of artery blockage and has had a bypass in the past, RT AKA, he is also in renal failure requiring dialysis three times a week.  At this time there is noted swelling, although the pt states that his is normal for him.  The pt states that has his wife cleanse and moisturize his legs on a regular basis, however, it took ten soapy washclothes before the pt leg was clean.  Mr. Mohamed will benefit from skilled PT for skilled guidance on how to create a healing environment to allow his wounds to heal and decrease his risk of cellulitis.    Wound Therapy - Functional Problem List difficult to dress;bath    Factors Delaying/Impairing Wound Healing Diabetes Mellitus;Infection - systemic/local;Immobility;Multiple medical problems;Polypharmacy;Vascular compromise    Hydrotherapy Plan Debridement;Dressing change;Patient/family education    Wound Therapy - Frequency 2X / week    Wound Therapy - Current Recommendations PT    Wound Plan See for  debridement and dressing  changes twice a week for 4 weeks.    Dressing  Therapist thourghly cleansed LE followed by debridement, xeroform, lotion, vaseling, kerlix, coban and netting.                   Objective measurements completed on examination: See above findings.             PT Education - 09/08/21 1213     Education Details keep dressing clean and dry, go home and cleanse RT residual limb f/b lotion and shrinker, begin wheelchair pushups and get of buttock as much as possible.  We will not be able to treat his buttock wound until we have a prescription    Person(s) Educated Patient;Spouse    Methods Explanation    Comprehension Verbalized understanding;Returned demonstration              PT Short Term Goals - 09/08/21 1314       PT SHORT TERM GOAL #1   Title Pt to have 7 or less wounds    Time 2    Period Weeks    Status New    Target Date 09/22/21               PT Long Term Goals - 09/08/21 1315       PT LONG TERM GOAL #1   Title PT to have 2 or fewer wounds on Lt LE    Time 4    Period Weeks    Status New    Target Date 10/06/21      PT LONG TERM GOAL #2   Title Pt to have obtained and be wearing light thigh high compresssion.  Information given to pt at eval    Time 4    Period Weeks    Status New                  Plan - 09/08/21 1310     Clinical Impression Statement as above    Personal Factors and Comorbidities Social Background;Time since onset of injury/illness/exacerbation;Comorbidity 1    Comorbidities renal failure, DM, arterial blockage    Examination-Activity Limitations Dressing    Examination-Participation Restrictions Other    Stability/Clinical Decision Making Stable/Uncomplicated    Clinical Decision Making Moderate    Rehab Potential Good    PT Frequency 2x / week    PT Duration 4 weeks    PT Treatment/Interventions ADLs/Self Care Home Management;Patient/family education;Other (comment)    debridement and dressing change   PT Next Visit Plan Continue with debridement and dressing change.  See if pt has ordered light thigh high compression,(measurements given to pt PT recommended 8-15)    PT Home Exercise Plan wheelchair pushups and glut sets             Patient will benefit from skilled therapeutic intervention in order to improve the following deficits and impairments:  Pain, Increased edema, Decreased skin integrity  Visit Diagnosis: Multiple open wounds of left lower leg  Pain in left lower leg    Problem List Patient Active Problem List   Diagnosis Date Noted   Right above-knee amputee ([place]) 11/10/2020   Tobacco abuse 11/10/2020   Carotid artery stenosis 10/24/2020   Optic neuropathy 07/29/2020   CAD (coronary artery disease)    Diabetic ulcer of ankle ([place]) 12/28/2019   Non-pressure chronic ulcer of unspecified ankle with unspecified severity ([place]) 12/28/2019   Hx of AKA (above knee amputation) ([place]) 08/31/2019   Major depressive disorder, recurrent,  moderate ([place]) 03/25/2019   Edema 12/31/2018   Post-operative pain    Labile blood pressure    Labile blood glucose    Hx of anxiety disorder    Hypoglycemia    Diabetic peripheral neuropathy (HCC)    Neuropathic pain    S/P unilateral BKA (below knee amputation), right (HCC)    Sleep disturbance    Type 2 diabetes mellitus with peripheral neuropathy (HCC)    Benign essential HTN    Acute blood loss anemia    Anemia of chronic disease    Right below-knee amputee ([place]) 12/03/2018   Necrotizing fasciitis of ankle and foot (HCC)    Unilateral complete BKA, right, initial encounter ([place])    Postoperative pain    Phantom limb pain (HCC)    Drug induced constipation    Poorly controlled type 2 diabetes mellitus with peripheral neuropathy (HCC)    Acute on chronic anemia    Ischemia of extremity 12/01/2018   Cellulitis of left leg 09/10/2018   Melena 11/19/2017   Unilateral primary osteoarthritis,  right knee 08/09/2017   Body mass index (BMI) of 30.0 to 30.9 in adult 59/93/5701   Metabolic disorder 77/93/9030   Osteopathy in diseases classified elsewhere, unspecified site 03/09/2017   PAD (peripheral artery disease) ([place]) 02/12/2017   Critical lower limb ischemia ([place]) 02/11/2017   Dependence on renal dialysis ([place]) 01/14/2017   Patient's noncompliance with other medical treatment and regimen 01/14/2017   Anemia in chronic kidney disease 01/14/2017   Type 2 diabetes mellitus with complication ([place]) 05/05/3006   Type 2 diabetes mellitus with diabetic neuropathy ([place]) 01/14/2017   Other specified personal risk factors, not elsewhere classified 09/12/2016   Chronic obstructive pulmonary disease with acute lower respiratory infection ([place]) 09/12/2016   Hardening of the aorta (main artery of the heart) ([place]) 06/20/2016   Hypothyroidism, unspecified 06/20/2016   Gastro-esophageal reflux disease with esophagitis 03/12/2016   Peripheral neuropathy 02/16/2016   Abdominal aortic aneurysm, without rupture 12/07/2015   Foot drop, left foot 12/07/2015   Repeated falls 12/07/2015   Laceration without foreign body of other part of head, initial encounter 10/04/2015   Acute respiratory failure with hypoxia ([place]) 08/28/2015   Hypertensive urgency 08/28/2015   Chronic anemia 08/28/2015   ESRD on dialysis ([place]) 08/28/2015   Diabetes mellitus with end stage renal disease ([place]) 08/28/2015   Dysuria 08/01/2015   Disequilibrium 06/20/2015   Other idiopathic peripheral autonomic neuropathy 02/09/2015   Contusion of right elbow 12/20/2014   Contusion of right hip 12/20/2014   Contusion of right shoulder 12/20/2014   Abrasion of right upper arm 11/28/2014   Abrasion, right lower leg, initial encounter 11/28/2014   Strain of muscle, fascia and tendon of right hip, initial encounter 11/28/2014   Radiohumeral (joint) sprain of right elbow, initial encounter 11/28/2014   PVD (peripheral vascular  disease) ([place]) 07/15/2014   Peripheral vascular disease, unspecified ([place]) 01/14/2014   Chronic kidney disease, stage V ([place]) 09/22/2013   Low back pain 06/02/2013   Asthma with exacerbation 05/21/2013   Pain in limb-left arm 04/09/2013   Guaiac + stool 04/07/2013   Anemia 04/07/2013   Dysphagia, unspecified(787.20) 04/07/2013   Numbness and tingling-left arm  02/04/2013   Cold hands and feet-left arm 62/26/3335   Other complications due to renal dialysis device, implant, and graft 06/06/2012   Tobacco use disorder 05/23/2012   Osteoarthrosis 05/23/2012   Mixed hyperlipidemia 05/23/2012   End stage renal disease ([place]) 02/05/2012   Injury to blood vessels,  unspecified site 02/05/2012   Cellulitis and abscess of face 12/14/2011   Essential hypertension 11/27/2011   Pain in joint, shoulder region 09/14/2011   Sprain and strain of shoulder and upper arm 09/05/2011   Infective otitis externa 05/17/2011  Rayetta Humphrey, PT CLT [phone]  09/08/2021, 1:18 PM  [place], [place], [address] Phone: [phone]   Fax:  [phone]  Name: AMORE GRATER MRN: [id number] Date of Birth: 1950-09-13

## 2021-09-09 DIAGNOSIS — Z992 Dependence on renal dialysis: Secondary | ICD-10-CM | POA: Diagnosis not present

## 2021-09-09 DIAGNOSIS — D509 Iron deficiency anemia, unspecified: Secondary | ICD-10-CM | POA: Diagnosis not present

## 2021-09-09 DIAGNOSIS — N186 End stage renal disease: Secondary | ICD-10-CM | POA: Diagnosis not present

## 2021-09-09 DIAGNOSIS — D631 Anemia in chronic kidney disease: Secondary | ICD-10-CM | POA: Diagnosis not present

## 2021-09-09 DIAGNOSIS — N2581 Secondary hyperparathyroidism of renal origin: Secondary | ICD-10-CM | POA: Diagnosis not present

## 2021-09-11 ENCOUNTER — Other Ambulatory Visit: Payer: Self-pay

## 2021-09-11 ENCOUNTER — Ambulatory Visit (HOSPITAL_COMMUNITY): Payer: Medicare Other | Admitting: Physical Therapy

## 2021-09-11 DIAGNOSIS — M79662 Pain in left lower leg: Secondary | ICD-10-CM | POA: Diagnosis not present

## 2021-09-11 DIAGNOSIS — S81802A Unspecified open wound, left lower leg, initial encounter: Secondary | ICD-10-CM

## 2021-09-11 NOTE — Therapy (Signed)
[facility] [address] [place], [place], [address] Phone: [phone]   Fax:  [phone]  Wound Care Therapy  Patient Details  Name: [name] MRN: [id number] Date of Birth: [birth year] Referring Provider (PT): Gar Ponto   Encounter Date: 09/11/2021   PT End of Session - 09/11/21 1629     Visit Number 2    Number of Visits 8    Date for PT Re-Evaluation 10/08/21    Authorization Type medicare / BCBS    Progress Note Due on Visit 8    PT Start Time 1320    PT Stop Time 1400    PT Time Calculation (min) 40 min    Activity Tolerance Patient tolerated treatment well             Past Medical History:  Diagnosis Date   Anemia    Anxiety    Arthritis    CAD (coronary artery disease)    Myoview August 2021 with evidence of large inferior scar but no active ischemia   COPD (chronic obstructive pulmonary disease) ([place])    Depression    ESRD on hemodialysis ([place])    Essential hypertension    GERD (gastroesophageal reflux disease)    H/O hiatal hernia    Headache(784.0)    History of kidney stones    History of pneumonia    Hypothyroidism    Neuropathy    Non-healing wound of amputation stump ([place])    Right   Peripheral vascular disease ([place])    PTSD (post-traumatic stress disorder)    Type 2 diabetes mellitus ([place])     Past Surgical History:  Procedure Laterality Date   A/V FISTULAGRAM Left 01/16/2019   Procedure: A/V FISTULAGRAM;  Surgeon: [name], MD;  Location: [facility];  Service: Cardiovascular;  Laterality: Left;   ABDOMINAL AORTOGRAM W/LOWER EXTREMITY Bilateral 03/18/2020   Procedure: ABDOMINAL AORTOGRAM W/LOWER EXTREMITY;  Surgeon: [name], MD;  Location: [facility];  Service: Cardiovascular;  Laterality: Bilateral;   AMPUTATION Right 12/01/2018   Procedure: RIGHT AMPUTATION BELOW KNEE;  Surgeon: [name], MD;  Location: [facility];  Service: Vascular;  Laterality:  Right;   AMPUTATION Right 04/27/2019   Procedure: AMPUTATION BELOW KNEE REVISION;  Surgeon: [name], MD;  Location: [facility];  Service: Vascular;  Laterality: Right;   AMPUTATION Right 04/30/2019   Procedure: AMPUTATION ABOVE KNEE - RIGHT;  Surgeon: Waynetta Sandy, MD;  Location: [facility];  Service: Vascular;  Laterality: Right;   APPLICATION OF WOUND VAC Right 04/27/2019   Procedure: APPLICATION OF WOUND VAC;  Surgeon: [name], MD;  Location: [facility];  Service: Vascular;  Laterality: Right;   AV FISTULA PLACEMENT  2012      left arm    AV FISTULA PLACEMENT Left 12/18/2012   Procedure: ARTERIOVENOUS (AV) FISTULA CREATION;  Surgeon: Angelia Mould, MD;  Location: [facility];  Service: Vascular;  Laterality: Left;   COLONOSCOPY  10/26/2011   Procedure: COLONOSCOPY;  Surgeon: Rogene [place], MD;  [facility];  Service: Endoscopy;  Laterality: N/A;  730   EMBOLECTOMY Left 12/09/2012   Procedure: EMBOLECTOMY;  Surgeon: Serafina Mitchell, MD;  Location: [facility];  Service: Cardiovascular;  Laterality: Left;  left arm venous embolization   ENDARTERECTOMY Left 10/24/2020   Procedure: LEFT CAROTID ENDARTERECTOMY;  Surgeon: [name], MD;  Location: [facility];  Service: Vascular;  Laterality: Left;  ENDARTERECTOMY FEMORAL Left 04/04/2020   Procedure: ENDARTERECTOMY COMMON FEMORAL;  Surgeon: [name], MD;  Location: [facility];  Service: Vascular;  Laterality: Left;   ESOPHAGOGASTRODUODENOSCOPY (EGD) WITH ESOPHAGEAL DILATION N/A 04/23/2013   Procedure: ESOPHAGOGASTRODUODENOSCOPY (EGD) WITH ESOPHAGEAL DILATION;  Surgeon: Rogene [place], MD;  [facility];  Service: Endoscopy;  Laterality: N/A;  200-moved to 930    FEMORAL-POPLITEAL BYPASS GRAFT Left 04/04/2020   Procedure: FEMORAL- BELOW KNEE POPLITEAL BYPASS GRAFT NON REVERSED VEIN;  Surgeon: [name], MD;  Location: [facility];  Service: Vascular;  Laterality: Left;   FISTULA SUPERFICIALIZATION Left  06/18/2013   Procedure: FISTULA SUPERFICIALIZATION & LIGATION BRANCH X 1;  Surgeon: Mal Misty, MD;  Location: [facility];  Service: Vascular;  Laterality: Left;   GROIN DEBRIDEMENT Left 04/29/2020   Procedure: EXPLORATION LEFT GROIN WITH DEBRIDEMENT;  Surgeon: Rosetta Posner, MD;  Location: [facility];  Service: Vascular;  Laterality: Left;   HEMATOMA EVACUATION Right 02/11/2017   Procedure: EVACUATION HEMATOMA RIGHT GROIN, Repair of Right Pseudo-anerysm.;  Surgeon: [name], MD;  Location: [facility];  Service: Vascular;  Laterality: Right;   INGUINAL HERNIA REPAIR     ,  times   2   INSERTION OF DIALYSIS CATHETER Left 12/18/2012   Procedure: INSERTION OF DIALYSIS CATHETER;  Surgeon: Angelia Mould, MD;  Location: [facility];  Service: Vascular;  Laterality: Left;   KNEE ARTHROSCOPY  2011   Right Knee   LOWER EXTREMITY ANGIOGRAPHY N/A 02/11/2017   Procedure: Lower Extremity Angiography;  Surgeon: Lorretta Harp, MD;  Location: [facility];  Service: Cardiovascular;  Laterality: N/A;   PERIPHERAL ATHRECTOMY  02/11/2017   PERIPHERAL VASCULAR ATHERECTOMY Left 02/11/2017   Procedure: Peripheral Vascular Atherectomy;  Surgeon: Lorretta Harp, MD;  Location: [facility];  Service: Cardiovascular;  Laterality: Left;   PERIPHERAL VASCULAR BALLOON ANGIOPLASTY Left 01/16/2019   Procedure: PERIPHERAL VASCULAR BALLOON ANGIOPLASTY;  Surgeon: [name], MD;  Location: [facility];  Service: Cardiovascular;  Laterality: Left;  central vein   REVISON OF ARTERIOVENOUS FISTULA Left 12/16/3974   Procedure: PLICATION OF LEFT BRACHIOCEPHALIC ARTERIOVENOUS FISTULA;  Surgeon: Conrad [place], MD;  Location: [facility];  Service: Vascular;  Laterality: Left;   SHUNTOGRAM N/A 12/09/2012   Procedure: fistulogram;  Surgeon: Serafina Mitchell, MD;  Location: [facility];  Service: Cardiovascular;  Laterality: N/A;   SHUNTOGRAM Left 06/03/2013   Procedure: Fistulogram;  Surgeon: Serafina Mitchell, MD;   Location: [facility] LAB;  Service: Cardiovascular;  Laterality: Left;   THROMBECTOMY W/ EMBOLECTOMY Left 12/11/2012   Procedure: THROMBECTOMY ARTERIOVENOUS FISTULA;  Surgeon: Serafina Mitchell, MD;  Location: [facility];  Service: Vascular;  Laterality: Left;   TONSILLECTOMY     WOUND DEBRIDEMENT Right 02/11/2019   Procedure: DEBRIDEMENT WOUND RIGHT BELOW THE KNEE STUMP;  Surgeon: Serafina Mitchell, MD;  Location: [facility];  Service: Vascular;  Laterality: Right;    There were no vitals filed for this visit.               Wound Therapy - 09/11/21 0001     Subjective pt states the bandage stayed on.  States he is receiving HHPT at the moment so discussed this with pt; he will f/u with MD regarding changing to [place] as well.  Pt reports no pain.l    Pain Scale 0-10    Pain Score 0-No pain    Wound Properties Date First  Assessed: 09/08/21 Time First Assessed: 1000 Wound Type: Non-pressure wound Location: Leg Location Orientation: Left Wound Description (Comments): 13 + wounds Present on Admission: Yes   Dressing Type Impregnated gauze (bismuth);Gauze (Comment);Compression wrap    Dressing Changed Changed    Dressing Status Intact    Dressing Change Frequency PRN    Site / Wound Assessment Pink;Red;Yellow    % Wound base Red or Granulating 80%    % Wound base Yellow/Fibrinous Exudate 20%    Peri-wound Assessment Intact    Drainage Amount Scant    Drainage Description Serosanguineous    Treatment Cleansed;Debridement (Selective)    Selective Debridement - Location wound beds    Selective Debridement - Tools Used Forceps    Selective Debridement - Tissue Removed scab, slough and devitalized tissue.    Wound Therapy - Clinical Statement Skin overall with improved integrity in comparison to phtographs from last visit.  Able to remove scabs off of several areas revealing completing healing below.  Several areas above knee remain but was able to debride most of slough from these (5 total above  knee).  Pt has 6 wounds remaining below knee, overall improving.  Moisturzed LE well and continued with xeroform over the wounds.  Used kerlix and coban to knee level only then dressed areas on knee and above using gauze and medipore tape.  pt repors bandage being very uncomfortable behind his knee.    Wound Therapy - Functional Problem List difficult to dress;bath    Factors Delaying/Impairing Wound Healing Diabetes Mellitus;Infection - systemic/local;Immobility;Multiple medical problems;Polypharmacy;Vascular compromise    Hydrotherapy Plan Debridement;Dressing change;Patient/family education    Wound Therapy - Frequency 2X / week    Wound Therapy - Current Recommendations PT    Wound Plan See for debridement and dressing changes twice a week for 4 weeks.    Dressing  below knee: xeroform, gauze, kerlix, coban and netting    Dressing above knee:  xerform, gauze and medipore tape.                       PT Short Term Goals - 09/08/21 1314       PT SHORT TERM GOAL #1   Title Pt to have 7 or less wounds    Time 2    Period Weeks    Status New    Target Date 09/22/21               PT Long Term Goals - 09/08/21 1315       PT LONG TERM GOAL #1   Title PT to have 2 or fewer wounds on Lt LE    Time 4    Period Weeks    Status New    Target Date 10/06/21      PT LONG TERM GOAL #2   Title Pt to have obtained and be wearing light thigh high compresssion.  Information given to pt at eval    Time 4    Period Weeks    Status New                    Patient will benefit from skilled therapeutic intervention in order to improve the following deficits and impairments:     Visit Diagnosis: Multiple open wounds of left lower leg  Pain in left lower leg     Problem List Patient Active Problem List   Diagnosis Date Noted   Right above-knee amputee ([place]) 11/10/2020   Tobacco abuse 11/10/2020  Carotid artery stenosis 10/24/2020   Optic neuropathy  07/29/2020   CAD (coronary artery disease)    Diabetic ulcer of ankle ([place]) 12/28/2019   Non-pressure chronic ulcer of unspecified ankle with unspecified severity ([place]) 12/28/2019   Hx of AKA (above knee amputation) ([place]) 08/31/2019   Major depressive disorder, recurrent, moderate ([place]) 03/25/2019   Edema 12/31/2018   Post-operative pain    Labile blood pressure    Labile blood glucose    Hx of anxiety disorder    Hypoglycemia    Diabetic peripheral neuropathy (HCC)    Neuropathic pain    S/P unilateral BKA (below knee amputation), right (HCC)    Sleep disturbance    Type 2 diabetes mellitus with peripheral neuropathy (HCC)    Benign essential HTN    Acute blood loss anemia    Anemia of chronic disease    Right below-knee amputee ([place]) 12/03/2018   Necrotizing fasciitis of ankle and foot (HCC)    Unilateral complete BKA, right, initial encounter ([place])    Postoperative pain    Phantom limb pain ([place])    Drug induced constipation    Poorly controlled type 2 diabetes mellitus with peripheral neuropathy (HCC)    Acute on chronic anemia    Ischemia of extremity 12/01/2018   Cellulitis of left leg 09/10/2018   Melena 11/19/2017   Unilateral primary osteoarthritis, right knee 08/09/2017   Body mass index (BMI) of 30.0 to 30.9 in adult 31/51/7616   Metabolic disorder 07/37/1062   Osteopathy in diseases classified elsewhere, unspecified site 03/09/2017   PAD (peripheral artery disease) ([place]) 02/12/2017   Critical lower limb ischemia ([place]) 02/11/2017   Dependence on renal dialysis ([place]) 01/14/2017   Patient's noncompliance with other medical treatment and regimen 01/14/2017   Anemia in chronic kidney disease 01/14/2017   Type 2 diabetes mellitus with complication ([place]) 69/48/5462   Type 2 diabetes mellitus with diabetic neuropathy ([place]) 01/14/2017   Other specified personal risk factors, not elsewhere classified 09/12/2016   Chronic obstructive pulmonary disease with acute lower  respiratory infection ([place]) 09/12/2016   Hardening of the aorta (main artery of the heart) ([place]) 06/20/2016   Hypothyroidism, unspecified 06/20/2016   Gastro-esophageal reflux disease with esophagitis 03/12/2016   Peripheral neuropathy 02/16/2016   Abdominal aortic aneurysm, without rupture 12/07/2015   Foot drop, left foot 12/07/2015   Repeated falls 12/07/2015   Laceration without foreign body of other part of head, initial encounter 10/04/2015   Acute respiratory failure with hypoxia ([place]) 08/28/2015   Hypertensive urgency 08/28/2015   Chronic anemia 08/28/2015   ESRD on dialysis ([place]) 08/28/2015   Diabetes mellitus with end stage renal disease ([place]) 08/28/2015   Dysuria 08/01/2015   Disequilibrium 06/20/2015   Other idiopathic peripheral autonomic neuropathy 02/09/2015   Contusion of right elbow 12/20/2014   Contusion of right hip 12/20/2014   Contusion of right shoulder 12/20/2014   Abrasion of right upper arm 11/28/2014   Abrasion, right lower leg, initial encounter 11/28/2014   Strain of muscle, fascia and tendon of right hip, initial encounter 11/28/2014   Radiohumeral (joint) sprain of right elbow, initial encounter 11/28/2014   PVD (peripheral vascular disease) ([place]) 07/15/2014   Peripheral vascular disease, unspecified ([place]) 01/14/2014   Chronic kidney disease, stage V ([place]) 09/22/2013   Low back pain 06/02/2013   Asthma with exacerbation 05/21/2013   Pain in limb-left arm 04/09/2013   Guaiac + stool 04/07/2013   Anemia 04/07/2013   Dysphagia, unspecified(787.20) 04/07/2013   Numbness and tingling-left  arm  02/04/2013   Cold hands and feet-left arm 88/67/7373   Other complications due to renal dialysis device, implant, and graft 06/06/2012   Tobacco use disorder 05/23/2012   Osteoarthrosis 05/23/2012   Mixed hyperlipidemia 05/23/2012   End stage renal disease ([place]) 02/05/2012   Injury to blood vessels, unspecified site 02/05/2012   Cellulitis and abscess of face  12/14/2011   Essential hypertension 11/27/2011   Pain in joint, shoulder region 09/14/2011   Sprain and strain of shoulder and upper arm 09/05/2011   Infective otitis externa 05/17/2011   Teena Irani, PTA/CLT, WTA [phone]  Teena Irani, PTA 09/11/2021, 4:30 PM  [facility] [address] [place], [place], [address] Phone: [phone]   Fax:  [phone]  Name: [name] MRN: [id number] Date of Birth: Dec 12, 1950

## 2021-09-12 DIAGNOSIS — Z992 Dependence on renal dialysis: Secondary | ICD-10-CM | POA: Diagnosis not present

## 2021-09-12 DIAGNOSIS — N186 End stage renal disease: Secondary | ICD-10-CM | POA: Diagnosis not present

## 2021-09-12 DIAGNOSIS — D631 Anemia in chronic kidney disease: Secondary | ICD-10-CM | POA: Diagnosis not present

## 2021-09-12 DIAGNOSIS — N2581 Secondary hyperparathyroidism of renal origin: Secondary | ICD-10-CM | POA: Diagnosis not present

## 2021-09-12 DIAGNOSIS — D509 Iron deficiency anemia, unspecified: Secondary | ICD-10-CM | POA: Diagnosis not present

## 2021-09-13 ENCOUNTER — Ambulatory Visit (HOSPITAL_COMMUNITY): Payer: Medicare Other

## 2021-09-13 ENCOUNTER — Encounter (HOSPITAL_COMMUNITY): Payer: Self-pay | Admitting: Physical Therapy

## 2021-09-13 DIAGNOSIS — D631 Anemia in chronic kidney disease: Secondary | ICD-10-CM | POA: Diagnosis not present

## 2021-09-13 DIAGNOSIS — D509 Iron deficiency anemia, unspecified: Secondary | ICD-10-CM | POA: Diagnosis not present

## 2021-09-13 DIAGNOSIS — Z992 Dependence on renal dialysis: Secondary | ICD-10-CM | POA: Diagnosis not present

## 2021-09-13 DIAGNOSIS — M6281 Muscle weakness (generalized): Secondary | ICD-10-CM | POA: Diagnosis not present

## 2021-09-13 DIAGNOSIS — N186 End stage renal disease: Secondary | ICD-10-CM | POA: Diagnosis not present

## 2021-09-13 DIAGNOSIS — N2581 Secondary hyperparathyroidism of renal origin: Secondary | ICD-10-CM | POA: Diagnosis not present

## 2021-09-13 NOTE — Therapy (Signed)
[facility] [place], [place], [address] Phone: [phone]   Fax:  [phone]  Patient Details  Name: DARRIK RICHMAN MRN: [id number] Date of Birth: 1950/10/19 Referring Provider:  No ref. provider found  Encounter Date: 09/13/2021   PHYSICAL THERAPY DISCHARGE SUMMARY  Visits from Start of Care: 1  Current functional level related to goals / functional outcomes: Unknown pt receiving home health at this time   Remaining deficits: unknown   Education / Equipment: Caring for wounds    Patient agrees to discharge. Patient goals were not met. Patient is being discharged due to not returning since the last visit.  Rayetta Humphrey, PT CLT [phone]  09/13/2021, 9:42 AM  [facility] [address] [place], [place], [address] Phone: [phone]   Fax:  [phone]

## 2021-09-14 DIAGNOSIS — N186 End stage renal disease: Secondary | ICD-10-CM | POA: Diagnosis not present

## 2021-09-14 DIAGNOSIS — D509 Iron deficiency anemia, unspecified: Secondary | ICD-10-CM | POA: Diagnosis not present

## 2021-09-14 DIAGNOSIS — N2581 Secondary hyperparathyroidism of renal origin: Secondary | ICD-10-CM | POA: Diagnosis not present

## 2021-09-14 DIAGNOSIS — Z992 Dependence on renal dialysis: Secondary | ICD-10-CM | POA: Diagnosis not present

## 2021-09-14 DIAGNOSIS — D631 Anemia in chronic kidney disease: Secondary | ICD-10-CM | POA: Diagnosis not present

## 2021-09-16 DIAGNOSIS — D509 Iron deficiency anemia, unspecified: Secondary | ICD-10-CM | POA: Diagnosis not present

## 2021-09-16 DIAGNOSIS — Z992 Dependence on renal dialysis: Secondary | ICD-10-CM | POA: Diagnosis not present

## 2021-09-16 DIAGNOSIS — N186 End stage renal disease: Secondary | ICD-10-CM | POA: Diagnosis not present

## 2021-09-16 DIAGNOSIS — N2581 Secondary hyperparathyroidism of renal origin: Secondary | ICD-10-CM | POA: Diagnosis not present

## 2021-09-16 DIAGNOSIS — D631 Anemia in chronic kidney disease: Secondary | ICD-10-CM | POA: Diagnosis not present

## 2021-09-18 ENCOUNTER — Ambulatory Visit (HOSPITAL_COMMUNITY): Payer: Medicare Other | Admitting: Physical Therapy

## 2021-09-18 DIAGNOSIS — E039 Hypothyroidism, unspecified: Secondary | ICD-10-CM | POA: Diagnosis not present

## 2021-09-18 DIAGNOSIS — E1151 Type 2 diabetes mellitus with diabetic peripheral angiopathy without gangrene: Secondary | ICD-10-CM | POA: Diagnosis not present

## 2021-09-18 DIAGNOSIS — Z720 Tobacco use: Secondary | ICD-10-CM | POA: Diagnosis not present

## 2021-09-18 DIAGNOSIS — I700 Atherosclerosis of aorta: Secondary | ICD-10-CM | POA: Diagnosis not present

## 2021-09-18 DIAGNOSIS — Z992 Dependence on renal dialysis: Secondary | ICD-10-CM | POA: Diagnosis not present

## 2021-09-18 DIAGNOSIS — L97228 Non-pressure chronic ulcer of left calf with other specified severity: Secondary | ICD-10-CM | POA: Diagnosis not present

## 2021-09-18 DIAGNOSIS — K5900 Constipation, unspecified: Secondary | ICD-10-CM | POA: Diagnosis not present

## 2021-09-18 DIAGNOSIS — J449 Chronic obstructive pulmonary disease, unspecified: Secondary | ICD-10-CM | POA: Diagnosis not present

## 2021-09-18 DIAGNOSIS — M1711 Unilateral primary osteoarthritis, right knee: Secondary | ICD-10-CM | POA: Diagnosis not present

## 2021-09-18 DIAGNOSIS — Z89611 Acquired absence of right leg above knee: Secondary | ICD-10-CM | POA: Diagnosis not present

## 2021-09-18 DIAGNOSIS — F331 Major depressive disorder, recurrent, moderate: Secondary | ICD-10-CM | POA: Diagnosis not present

## 2021-09-18 DIAGNOSIS — N186 End stage renal disease: Secondary | ICD-10-CM | POA: Diagnosis not present

## 2021-09-18 DIAGNOSIS — K2100 Gastro-esophageal reflux disease with esophagitis, without bleeding: Secondary | ICD-10-CM | POA: Diagnosis not present

## 2021-09-18 DIAGNOSIS — I120 Hypertensive chronic kidney disease with stage 5 chronic kidney disease or end stage renal disease: Secondary | ICD-10-CM | POA: Diagnosis not present

## 2021-09-18 DIAGNOSIS — Z794 Long term (current) use of insulin: Secondary | ICD-10-CM | POA: Diagnosis not present

## 2021-09-18 DIAGNOSIS — I70242 Atherosclerosis of native arteries of left leg with ulceration of calf: Secondary | ICD-10-CM | POA: Diagnosis not present

## 2021-09-18 DIAGNOSIS — E11622 Type 2 diabetes mellitus with other skin ulcer: Secondary | ICD-10-CM | POA: Diagnosis not present

## 2021-09-18 DIAGNOSIS — Z7902 Long term (current) use of antithrombotics/antiplatelets: Secondary | ICD-10-CM | POA: Diagnosis not present

## 2021-09-18 DIAGNOSIS — E785 Hyperlipidemia, unspecified: Secondary | ICD-10-CM | POA: Diagnosis not present

## 2021-09-18 DIAGNOSIS — Z20822 Contact with and (suspected) exposure to covid-19: Secondary | ICD-10-CM | POA: Diagnosis not present

## 2021-09-18 DIAGNOSIS — I350 Nonrheumatic aortic (valve) stenosis: Secondary | ICD-10-CM | POA: Diagnosis not present

## 2021-09-18 DIAGNOSIS — E1122 Type 2 diabetes mellitus with diabetic chronic kidney disease: Secondary | ICD-10-CM | POA: Diagnosis not present

## 2021-09-18 DIAGNOSIS — E1142 Type 2 diabetes mellitus with diabetic polyneuropathy: Secondary | ICD-10-CM | POA: Diagnosis not present

## 2021-09-19 DIAGNOSIS — N186 End stage renal disease: Secondary | ICD-10-CM | POA: Diagnosis not present

## 2021-09-19 DIAGNOSIS — D631 Anemia in chronic kidney disease: Secondary | ICD-10-CM | POA: Diagnosis not present

## 2021-09-19 DIAGNOSIS — G894 Chronic pain syndrome: Secondary | ICD-10-CM | POA: Diagnosis not present

## 2021-09-19 DIAGNOSIS — N2581 Secondary hyperparathyroidism of renal origin: Secondary | ICD-10-CM | POA: Diagnosis not present

## 2021-09-19 DIAGNOSIS — G629 Polyneuropathy, unspecified: Secondary | ICD-10-CM | POA: Diagnosis not present

## 2021-09-19 DIAGNOSIS — Z79891 Long term (current) use of opiate analgesic: Secondary | ICD-10-CM | POA: Diagnosis not present

## 2021-09-19 DIAGNOSIS — Z992 Dependence on renal dialysis: Secondary | ICD-10-CM | POA: Diagnosis not present

## 2021-09-19 DIAGNOSIS — D509 Iron deficiency anemia, unspecified: Secondary | ICD-10-CM | POA: Diagnosis not present

## 2021-09-19 DIAGNOSIS — Z79899 Other long term (current) drug therapy: Secondary | ICD-10-CM | POA: Diagnosis not present

## 2021-09-19 DIAGNOSIS — M545 Low back pain, unspecified: Secondary | ICD-10-CM | POA: Diagnosis not present

## 2021-09-20 ENCOUNTER — Ambulatory Visit (HOSPITAL_COMMUNITY): Payer: Medicare Other | Admitting: Physical Therapy

## 2021-09-21 DIAGNOSIS — L97228 Non-pressure chronic ulcer of left calf with other specified severity: Secondary | ICD-10-CM | POA: Diagnosis not present

## 2021-09-21 DIAGNOSIS — Z992 Dependence on renal dialysis: Secondary | ICD-10-CM | POA: Diagnosis not present

## 2021-09-21 DIAGNOSIS — E11622 Type 2 diabetes mellitus with other skin ulcer: Secondary | ICD-10-CM | POA: Diagnosis not present

## 2021-09-21 DIAGNOSIS — E1151 Type 2 diabetes mellitus with diabetic peripheral angiopathy without gangrene: Secondary | ICD-10-CM | POA: Diagnosis not present

## 2021-09-21 DIAGNOSIS — I70242 Atherosclerosis of native arteries of left leg with ulceration of calf: Secondary | ICD-10-CM | POA: Diagnosis not present

## 2021-09-21 DIAGNOSIS — D631 Anemia in chronic kidney disease: Secondary | ICD-10-CM | POA: Diagnosis not present

## 2021-09-21 DIAGNOSIS — D509 Iron deficiency anemia, unspecified: Secondary | ICD-10-CM | POA: Diagnosis not present

## 2021-09-21 DIAGNOSIS — N2581 Secondary hyperparathyroidism of renal origin: Secondary | ICD-10-CM | POA: Diagnosis not present

## 2021-09-21 DIAGNOSIS — M1711 Unilateral primary osteoarthritis, right knee: Secondary | ICD-10-CM | POA: Diagnosis not present

## 2021-09-21 DIAGNOSIS — E1142 Type 2 diabetes mellitus with diabetic polyneuropathy: Secondary | ICD-10-CM | POA: Diagnosis not present

## 2021-09-21 DIAGNOSIS — N186 End stage renal disease: Secondary | ICD-10-CM | POA: Diagnosis not present

## 2021-09-23 DIAGNOSIS — Z992 Dependence on renal dialysis: Secondary | ICD-10-CM | POA: Diagnosis not present

## 2021-09-23 DIAGNOSIS — D631 Anemia in chronic kidney disease: Secondary | ICD-10-CM | POA: Diagnosis not present

## 2021-09-23 DIAGNOSIS — N186 End stage renal disease: Secondary | ICD-10-CM | POA: Diagnosis not present

## 2021-09-23 DIAGNOSIS — D509 Iron deficiency anemia, unspecified: Secondary | ICD-10-CM | POA: Diagnosis not present

## 2021-09-23 DIAGNOSIS — N2581 Secondary hyperparathyroidism of renal origin: Secondary | ICD-10-CM | POA: Diagnosis not present

## 2021-09-26 ENCOUNTER — Ambulatory Visit (HOSPITAL_COMMUNITY): Payer: Medicare Other | Admitting: Physical Therapy

## 2021-09-26 DIAGNOSIS — D509 Iron deficiency anemia, unspecified: Secondary | ICD-10-CM | POA: Diagnosis not present

## 2021-09-26 DIAGNOSIS — Z992 Dependence on renal dialysis: Secondary | ICD-10-CM | POA: Diagnosis not present

## 2021-09-26 DIAGNOSIS — D631 Anemia in chronic kidney disease: Secondary | ICD-10-CM | POA: Diagnosis not present

## 2021-09-26 DIAGNOSIS — N186 End stage renal disease: Secondary | ICD-10-CM | POA: Diagnosis not present

## 2021-09-26 DIAGNOSIS — N2581 Secondary hyperparathyroidism of renal origin: Secondary | ICD-10-CM | POA: Diagnosis not present

## 2021-09-27 DIAGNOSIS — M1711 Unilateral primary osteoarthritis, right knee: Secondary | ICD-10-CM | POA: Diagnosis not present

## 2021-09-27 DIAGNOSIS — L97228 Non-pressure chronic ulcer of left calf with other specified severity: Secondary | ICD-10-CM | POA: Diagnosis not present

## 2021-09-27 DIAGNOSIS — I70242 Atherosclerosis of native arteries of left leg with ulceration of calf: Secondary | ICD-10-CM | POA: Diagnosis not present

## 2021-09-27 DIAGNOSIS — E1151 Type 2 diabetes mellitus with diabetic peripheral angiopathy without gangrene: Secondary | ICD-10-CM | POA: Diagnosis not present

## 2021-09-27 DIAGNOSIS — E1142 Type 2 diabetes mellitus with diabetic polyneuropathy: Secondary | ICD-10-CM | POA: Diagnosis not present

## 2021-09-27 DIAGNOSIS — E11622 Type 2 diabetes mellitus with other skin ulcer: Secondary | ICD-10-CM | POA: Diagnosis not present

## 2021-09-28 ENCOUNTER — Ambulatory Visit (INDEPENDENT_AMBULATORY_CARE_PROVIDER_SITE_OTHER): Payer: Medicare Other | Admitting: Gastroenterology

## 2021-09-28 ENCOUNTER — Ambulatory Visit (HOSPITAL_COMMUNITY): Payer: Medicare Other | Admitting: Physical Therapy

## 2021-09-28 DIAGNOSIS — N2581 Secondary hyperparathyroidism of renal origin: Secondary | ICD-10-CM | POA: Diagnosis not present

## 2021-09-28 DIAGNOSIS — L97228 Non-pressure chronic ulcer of left calf with other specified severity: Secondary | ICD-10-CM | POA: Diagnosis not present

## 2021-09-28 DIAGNOSIS — N186 End stage renal disease: Secondary | ICD-10-CM | POA: Diagnosis not present

## 2021-09-28 DIAGNOSIS — Z992 Dependence on renal dialysis: Secondary | ICD-10-CM | POA: Diagnosis not present

## 2021-09-28 DIAGNOSIS — E11622 Type 2 diabetes mellitus with other skin ulcer: Secondary | ICD-10-CM | POA: Diagnosis not present

## 2021-09-28 DIAGNOSIS — E1151 Type 2 diabetes mellitus with diabetic peripheral angiopathy without gangrene: Secondary | ICD-10-CM | POA: Diagnosis not present

## 2021-09-28 DIAGNOSIS — M1711 Unilateral primary osteoarthritis, right knee: Secondary | ICD-10-CM | POA: Diagnosis not present

## 2021-09-28 DIAGNOSIS — I70242 Atherosclerosis of native arteries of left leg with ulceration of calf: Secondary | ICD-10-CM | POA: Diagnosis not present

## 2021-09-28 DIAGNOSIS — D509 Iron deficiency anemia, unspecified: Secondary | ICD-10-CM | POA: Diagnosis not present

## 2021-09-28 DIAGNOSIS — E1142 Type 2 diabetes mellitus with diabetic polyneuropathy: Secondary | ICD-10-CM | POA: Diagnosis not present

## 2021-09-28 DIAGNOSIS — D631 Anemia in chronic kidney disease: Secondary | ICD-10-CM | POA: Diagnosis not present

## 2021-09-30 DIAGNOSIS — N186 End stage renal disease: Secondary | ICD-10-CM | POA: Diagnosis not present

## 2021-09-30 DIAGNOSIS — Z992 Dependence on renal dialysis: Secondary | ICD-10-CM | POA: Diagnosis not present

## 2021-09-30 DIAGNOSIS — N2581 Secondary hyperparathyroidism of renal origin: Secondary | ICD-10-CM | POA: Diagnosis not present

## 2021-09-30 DIAGNOSIS — D631 Anemia in chronic kidney disease: Secondary | ICD-10-CM | POA: Diagnosis not present

## 2021-09-30 DIAGNOSIS — D509 Iron deficiency anemia, unspecified: Secondary | ICD-10-CM | POA: Diagnosis not present

## 2021-10-03 DIAGNOSIS — N186 End stage renal disease: Secondary | ICD-10-CM | POA: Diagnosis not present

## 2021-10-03 DIAGNOSIS — N2581 Secondary hyperparathyroidism of renal origin: Secondary | ICD-10-CM | POA: Diagnosis not present

## 2021-10-03 DIAGNOSIS — D631 Anemia in chronic kidney disease: Secondary | ICD-10-CM | POA: Diagnosis not present

## 2021-10-03 DIAGNOSIS — Z992 Dependence on renal dialysis: Secondary | ICD-10-CM | POA: Diagnosis not present

## 2021-10-03 DIAGNOSIS — D509 Iron deficiency anemia, unspecified: Secondary | ICD-10-CM | POA: Diagnosis not present

## 2021-10-04 DIAGNOSIS — E1151 Type 2 diabetes mellitus with diabetic peripheral angiopathy without gangrene: Secondary | ICD-10-CM | POA: Diagnosis not present

## 2021-10-04 DIAGNOSIS — I70242 Atherosclerosis of native arteries of left leg with ulceration of calf: Secondary | ICD-10-CM | POA: Diagnosis not present

## 2021-10-04 DIAGNOSIS — E11622 Type 2 diabetes mellitus with other skin ulcer: Secondary | ICD-10-CM | POA: Diagnosis not present

## 2021-10-04 DIAGNOSIS — E1142 Type 2 diabetes mellitus with diabetic polyneuropathy: Secondary | ICD-10-CM | POA: Diagnosis not present

## 2021-10-04 DIAGNOSIS — M1711 Unilateral primary osteoarthritis, right knee: Secondary | ICD-10-CM | POA: Diagnosis not present

## 2021-10-04 DIAGNOSIS — L97228 Non-pressure chronic ulcer of left calf with other specified severity: Secondary | ICD-10-CM | POA: Diagnosis not present

## 2021-10-05 DIAGNOSIS — D509 Iron deficiency anemia, unspecified: Secondary | ICD-10-CM | POA: Diagnosis not present

## 2021-10-05 DIAGNOSIS — N2581 Secondary hyperparathyroidism of renal origin: Secondary | ICD-10-CM | POA: Diagnosis not present

## 2021-10-05 DIAGNOSIS — N186 End stage renal disease: Secondary | ICD-10-CM | POA: Diagnosis not present

## 2021-10-05 DIAGNOSIS — Z992 Dependence on renal dialysis: Secondary | ICD-10-CM | POA: Diagnosis not present

## 2021-10-05 DIAGNOSIS — D631 Anemia in chronic kidney disease: Secondary | ICD-10-CM | POA: Diagnosis not present

## 2021-10-07 DIAGNOSIS — D631 Anemia in chronic kidney disease: Secondary | ICD-10-CM | POA: Diagnosis not present

## 2021-10-07 DIAGNOSIS — N2581 Secondary hyperparathyroidism of renal origin: Secondary | ICD-10-CM | POA: Diagnosis not present

## 2021-10-07 DIAGNOSIS — D509 Iron deficiency anemia, unspecified: Secondary | ICD-10-CM | POA: Diagnosis not present

## 2021-10-07 DIAGNOSIS — N186 End stage renal disease: Secondary | ICD-10-CM | POA: Diagnosis not present

## 2021-10-07 DIAGNOSIS — Z992 Dependence on renal dialysis: Secondary | ICD-10-CM | POA: Diagnosis not present

## 2021-10-08 DIAGNOSIS — E11622 Type 2 diabetes mellitus with other skin ulcer: Secondary | ICD-10-CM | POA: Diagnosis not present

## 2021-10-08 DIAGNOSIS — E1151 Type 2 diabetes mellitus with diabetic peripheral angiopathy without gangrene: Secondary | ICD-10-CM | POA: Diagnosis not present

## 2021-10-08 DIAGNOSIS — L97228 Non-pressure chronic ulcer of left calf with other specified severity: Secondary | ICD-10-CM | POA: Diagnosis not present

## 2021-10-08 DIAGNOSIS — E1142 Type 2 diabetes mellitus with diabetic polyneuropathy: Secondary | ICD-10-CM | POA: Diagnosis not present

## 2021-10-08 DIAGNOSIS — M1711 Unilateral primary osteoarthritis, right knee: Secondary | ICD-10-CM | POA: Diagnosis not present

## 2021-10-08 DIAGNOSIS — I70242 Atherosclerosis of native arteries of left leg with ulceration of calf: Secondary | ICD-10-CM | POA: Diagnosis not present

## 2021-10-10 DIAGNOSIS — N186 End stage renal disease: Secondary | ICD-10-CM | POA: Diagnosis not present

## 2021-10-10 DIAGNOSIS — Z992 Dependence on renal dialysis: Secondary | ICD-10-CM | POA: Diagnosis not present

## 2021-10-10 DIAGNOSIS — D631 Anemia in chronic kidney disease: Secondary | ICD-10-CM | POA: Diagnosis not present

## 2021-10-10 DIAGNOSIS — D509 Iron deficiency anemia, unspecified: Secondary | ICD-10-CM | POA: Diagnosis not present

## 2021-10-10 DIAGNOSIS — N2581 Secondary hyperparathyroidism of renal origin: Secondary | ICD-10-CM | POA: Diagnosis not present

## 2021-10-11 ENCOUNTER — Ambulatory Visit (INDEPENDENT_AMBULATORY_CARE_PROVIDER_SITE_OTHER): Payer: Medicare Other

## 2021-10-11 ENCOUNTER — Encounter: Payer: Self-pay | Admitting: Vascular Surgery

## 2021-10-11 ENCOUNTER — Other Ambulatory Visit: Payer: Self-pay

## 2021-10-11 ENCOUNTER — Ambulatory Visit (INDEPENDENT_AMBULATORY_CARE_PROVIDER_SITE_OTHER): Payer: Medicare Other | Admitting: Vascular Surgery

## 2021-10-11 ENCOUNTER — Ambulatory Visit: Payer: Medicare Other

## 2021-10-11 DIAGNOSIS — L97228 Non-pressure chronic ulcer of left calf with other specified severity: Secondary | ICD-10-CM | POA: Diagnosis not present

## 2021-10-11 DIAGNOSIS — E1151 Type 2 diabetes mellitus with diabetic peripheral angiopathy without gangrene: Secondary | ICD-10-CM | POA: Diagnosis not present

## 2021-10-11 DIAGNOSIS — N186 End stage renal disease: Secondary | ICD-10-CM | POA: Diagnosis not present

## 2021-10-11 DIAGNOSIS — I6522 Occlusion and stenosis of left carotid artery: Secondary | ICD-10-CM

## 2021-10-11 DIAGNOSIS — Z992 Dependence on renal dialysis: Secondary | ICD-10-CM

## 2021-10-11 DIAGNOSIS — I739 Peripheral vascular disease, unspecified: Secondary | ICD-10-CM | POA: Diagnosis not present

## 2021-10-11 DIAGNOSIS — Z20828 Contact with and (suspected) exposure to other viral communicable diseases: Secondary | ICD-10-CM | POA: Diagnosis not present

## 2021-10-11 DIAGNOSIS — E1142 Type 2 diabetes mellitus with diabetic polyneuropathy: Secondary | ICD-10-CM | POA: Diagnosis not present

## 2021-10-11 DIAGNOSIS — E11622 Type 2 diabetes mellitus with other skin ulcer: Secondary | ICD-10-CM | POA: Diagnosis not present

## 2021-10-11 DIAGNOSIS — M1711 Unilateral primary osteoarthritis, right knee: Secondary | ICD-10-CM | POA: Diagnosis not present

## 2021-10-11 DIAGNOSIS — I70242 Atherosclerosis of native arteries of left leg with ulceration of calf: Secondary | ICD-10-CM | POA: Diagnosis not present

## 2021-10-11 NOTE — Progress Notes (Signed)
? ? [facility] ? ?Patient name: [name] MRN: [id number] DOB: March 03, 1951 Sex: male ? ?REASON FOR VISIT: Follow-up diffuse peripheral vascular occlusive disease ? ?HPI: ?[name] is a 71 y.o. male here today for follow-up.  He is here with his wife.  He has extensive history of peripheral vascular occlusive disease.  He most recently underwent left carotid endarterectomy for severe asymptomatic disease with Dr. [name] on 10/24/2020.  He has had no new neurologic deficits.  Does have a past history of right occipital stroke resulting in vision changes in his right.  He is also status post left femoral endarterectomy and left femoral to popliteal bypass for wound issues in August 2021.  He has a more remote history of right above-knee amputation.  He is end-stage renal disease with dialysis via a right upper arm AV fistula.  He does have venous stasis disease with diffuse excoriation over his left leg from his knee distally and this is being treated with local wound care and Unna boot ? ?Past Medical History:  ?Diagnosis Date  ? Anemia   ? Anxiety   ? Arthritis   ? CAD (coronary artery disease)   ? Myoview August 2021 with evidence of large inferior scar but no active ischemia  ? COPD (chronic obstructive pulmonary disease) ([place])   ? Depression   ? ESRD on hemodialysis ([place])   ? Essential hypertension   ? GERD (gastroesophageal reflux disease)   ? H/O hiatal hernia   ? Headache(784.0)   ? History of kidney stones   ? History of pneumonia   ? Hypothyroidism   ? Neuropathy   ? Non-healing wound of amputation stump ([place])   ? Right  ? Peripheral vascular disease ([place])   ? PTSD (post-traumatic stress disorder)   ? Type 2 diabetes mellitus ([place])   ? ? ?Family History  ?Problem Relation Age of Onset  ? Heart disease Father   ?     Heart Disease before age 44  ? Healthy Daughter   ? Healthy Daughter   ? ? ?SOCIAL HISTORY: ?Social History  ? ?Tobacco  Use  ? Smoking status: Every Day  ?  Packs/day: 0.50  ?  Years: 18.00  ?  Pack years: 9.00  ?  Types: Cigarettes  ?  Last attempt to quit: 06/27/2020  ?  Years since quitting: 1.2  ? Smokeless tobacco: Never  ?Substance Use Topics  ? Alcohol use: No  ?  Alcohol/week: 0.0 standard drinks  ? ? ?Allergies  ?Allergen Reactions  ? Ace Inhibitors   ?  Unknown reaction, tolerates lisinopril   ? Clonidine   ?  Unknown reaction   ? Morphine And Related Other (See Comments)  ?  Not effective  ? Oxytocin   ?  unknown  ? ? ?Current Outpatient Medications  ?Medication Sig Dispense Refill  ? albuterol (VENTOLIN HFA) 108 (90 Base) MCG/ACT inhaler Inhale 2 puffs into the lungs every 6 (six) hours as needed for wheezing or shortness of breath.     ? ALPRAZolam (XANAX) 0.5 MG tablet Take 0.5 mg by mouth 2 (two) times daily as needed for anxiety.    ? ARTIFICIAL TEAR OP Place 1 drop into both eyes every 6 (six) hours as needed (dry eyes).    ? atorvastatin (LIPITOR) 80 MG tablet Take 1 tablet (80 mg total) by mouth at bedtime. (Patient taking differently: Take 80 mg by mouth daily.) 30 tablet 5  ? bismuth subsalicylate (  PEPTO BISMOL) 262 MG/15ML suspension Take 30 mLs by mouth every 6 (six) hours as needed for indigestion.    ? calcium acetate (PHOSLO) 667 MG capsule Take 4 capsules (2,668 mg total) by mouth 3 (three) times daily with meals. (Patient taking differently: Take 667-1,334 mg by mouth See admin instructions. Take 1334 mg with meals and 667 mg with each snack) 360 capsule 0  ? cinacalcet (SENSIPAR) 30 MG tablet Take 1 tablet (30 mg total) by mouth every evening. 60 tablet 0  ? clopidogrel (PLAVIX) 75 MG tablet Take 1 tablet (75 mg total) by mouth daily. 30 tablet 0  ? cyclobenzaprine (FLEXERIL) 10 MG tablet Take 10 mg by mouth 3 (three) times daily as needed for muscle spasms.    ? diphenhydrAMINE (BENADRYL) 25 MG tablet Take 25 mg by mouth daily as needed for itching.    ? GLOBAL EASE INJECT PEN NEEDLES 32G X 4 MM MISC TO  BE USED DAILY WITH INSULIN    ? ibuprofen (ADVIL) 200 MG tablet Take 400 mg by mouth in the morning and at bedtime.    ? levothyroxine (SYNTHROID) 125 MCG tablet Take 1 tablet (125 mcg total) by mouth daily before breakfast. 30 tablet 0  ? lisinopril (ZESTRIL) 20 MG tablet Take 1 tablet by mouth daily.    ? multivitamin (RENA-VIT) TABS tablet Take 1 tablet by mouth daily.    ? NONFORMULARY OR COMPOUNDED ITEM Ketamine 10%, Baclofen 2%, Cyclobenzaprine 2%, Ketoprofen 10%, Gabapentin 6%, Bupivacaine 1%, Amitryptiline 5%, clonidine 0.2% ? ?Dispense 240 g ? ?Apply 1-2 g to affected area 3-4 times per day 1 each 1  ? ondansetron (ZOFRAN-ODT) 4 MG disintegrating tablet 4 mg as needed.    ? Oxycodone HCl 10 MG TABS Take 10 mg by mouth 3 (three) times daily.    ? polyethylene glycol (MIRALAX / GLYCOLAX) 17 g packet Take 17 g by mouth daily.    ? pregabalin (LYRICA) 150 MG capsule Take 1 capsule (150 mg total) by mouth 3 (three) times daily. 90 capsule 0  ? prochlorperazine (COMPAZINE) 25 MG suppository Place 1 suppository (25 mg total) rectally every 12 (twelve) hours as needed for nausea. 6 suppository 0  ? senna (SENOKOT) 8.6 MG tablet Take 1 tablet by mouth as needed for constipation.    ? sevelamer carbonate (RENVELA) 800 MG tablet Take 800-1,600 mg by mouth See admin instructions. Take 1600 mg with each meal and 800 mg with each snack    ? TOUJEO SOLOSTAR 300 UNIT/ML SOPN Inject 40-78 Units into the skin at bedtime as needed (if blood sugar is 130 or higher).    ? ?Current Facility-Administered Medications  ?Medication Dose Route Frequency Provider Last Rate Last Admin  ? 0.9 %  sodium chloride infusion  250 mL Intravenous PRN [name], MD      ? ? ?REVIEW OF SYSTEMS:  ?[X]  denotes positive finding, [ ]  denotes negative finding ?Cardiac  Comments:  ?Chest pain or chest pressure:    ?Shortness of breath upon exertion:    ?Short of breath when lying flat:    ?Irregular heart rhythm:    ?    ?Vascular    ?Pain in  calf, thigh, or hip brought on by ambulation:    ?Pain in feet at night that wakes you up from your sleep:     ?Blood clot in your veins:    ?Leg swelling:  x   ?    ? ? ?PHYSICAL EXAM: ?There were no  vitals filed for this visit. ? ?GENERAL: The patient is a well-nourished male, in no acute distress. The vital signs are documented above. ?CARDIOVASCULAR: Carotid arteries without bruits bilaterally.  Well-healed left neck incision.  I do not palpate pedal pulses.  Left upper arm AV fistula with some skin thinning but no ulceration ?PULMONARY: There is good air exchange  ?MUSCULOSKELETAL: There are no major deformities or cyanosis. ?NEUROLOGIC: No focal weakness or paresthesias are detected. ?SKIN: Multiple areas of excoriation over his left leg with significant edema and venous ulceration ?PSYCHIATRIC: The patient has a normal affect. ? ?DATA:  ?Carotid duplex today reveals no evidence of internal carotid artery stenosis bilaterally.  He does have elevated velocities in his left external carotid.  His carotid and subclavian waveforms suggest proximal occlusive disease.  Old CT scan from a year ago does show calcific changes at the origin of his great vessels ? ?Lower extremity arterial studies reveal patent graft with inability to calculate ankle arm indices due to skin changes. ? ?MEDICAL ISSUES: ?Stable overall from his diffuse arterial disease.  I do not feel that he is having any arterial insufficiency contributing to difficulty in healing his chronic venous stasis disease.  He will continue local wound care.  We will see him again in 1 year with repeat noninvasive studies ? ? ? ?Rosetta Posner, MD FACS ?[facility] ?Office Tel [phone] ? ?Note: Portions of this report may have been transcribed using voice recognition software.  Every effort has been made to ensure accuracy; however, inadvertent computerized transcription errors may still be present. ?

## 2021-10-12 DIAGNOSIS — N186 End stage renal disease: Secondary | ICD-10-CM | POA: Diagnosis not present

## 2021-10-12 DIAGNOSIS — D631 Anemia in chronic kidney disease: Secondary | ICD-10-CM | POA: Diagnosis not present

## 2021-10-12 DIAGNOSIS — N2581 Secondary hyperparathyroidism of renal origin: Secondary | ICD-10-CM | POA: Diagnosis not present

## 2021-10-12 DIAGNOSIS — Z992 Dependence on renal dialysis: Secondary | ICD-10-CM | POA: Diagnosis not present

## 2021-10-12 DIAGNOSIS — Z20822 Contact with and (suspected) exposure to covid-19: Secondary | ICD-10-CM | POA: Diagnosis not present

## 2021-10-12 DIAGNOSIS — D509 Iron deficiency anemia, unspecified: Secondary | ICD-10-CM | POA: Diagnosis not present

## 2021-10-13 DIAGNOSIS — L97228 Non-pressure chronic ulcer of left calf with other specified severity: Secondary | ICD-10-CM | POA: Diagnosis not present

## 2021-10-13 DIAGNOSIS — E1122 Type 2 diabetes mellitus with diabetic chronic kidney disease: Secondary | ICD-10-CM | POA: Diagnosis not present

## 2021-10-13 DIAGNOSIS — M1711 Unilateral primary osteoarthritis, right knee: Secondary | ICD-10-CM | POA: Diagnosis not present

## 2021-10-13 DIAGNOSIS — I70242 Atherosclerosis of native arteries of left leg with ulceration of calf: Secondary | ICD-10-CM | POA: Diagnosis not present

## 2021-10-13 DIAGNOSIS — E1142 Type 2 diabetes mellitus with diabetic polyneuropathy: Secondary | ICD-10-CM | POA: Diagnosis not present

## 2021-10-13 DIAGNOSIS — I120 Hypertensive chronic kidney disease with stage 5 chronic kidney disease or end stage renal disease: Secondary | ICD-10-CM | POA: Diagnosis not present

## 2021-10-13 DIAGNOSIS — E1151 Type 2 diabetes mellitus with diabetic peripheral angiopathy without gangrene: Secondary | ICD-10-CM | POA: Diagnosis not present

## 2021-10-13 DIAGNOSIS — E11622 Type 2 diabetes mellitus with other skin ulcer: Secondary | ICD-10-CM | POA: Diagnosis not present

## 2021-10-14 DIAGNOSIS — N2581 Secondary hyperparathyroidism of renal origin: Secondary | ICD-10-CM | POA: Diagnosis not present

## 2021-10-14 DIAGNOSIS — Z992 Dependence on renal dialysis: Secondary | ICD-10-CM | POA: Diagnosis not present

## 2021-10-14 DIAGNOSIS — N186 End stage renal disease: Secondary | ICD-10-CM | POA: Diagnosis not present

## 2021-10-14 DIAGNOSIS — D509 Iron deficiency anemia, unspecified: Secondary | ICD-10-CM | POA: Diagnosis not present

## 2021-10-14 DIAGNOSIS — D631 Anemia in chronic kidney disease: Secondary | ICD-10-CM | POA: Diagnosis not present

## 2021-10-17 DIAGNOSIS — G629 Polyneuropathy, unspecified: Secondary | ICD-10-CM | POA: Diagnosis not present

## 2021-10-17 DIAGNOSIS — D509 Iron deficiency anemia, unspecified: Secondary | ICD-10-CM | POA: Diagnosis not present

## 2021-10-17 DIAGNOSIS — Z79891 Long term (current) use of opiate analgesic: Secondary | ICD-10-CM | POA: Diagnosis not present

## 2021-10-17 DIAGNOSIS — M545 Low back pain, unspecified: Secondary | ICD-10-CM | POA: Diagnosis not present

## 2021-10-17 DIAGNOSIS — N2581 Secondary hyperparathyroidism of renal origin: Secondary | ICD-10-CM | POA: Diagnosis not present

## 2021-10-17 DIAGNOSIS — G894 Chronic pain syndrome: Secondary | ICD-10-CM | POA: Diagnosis not present

## 2021-10-17 DIAGNOSIS — Z992 Dependence on renal dialysis: Secondary | ICD-10-CM | POA: Diagnosis not present

## 2021-10-17 DIAGNOSIS — D631 Anemia in chronic kidney disease: Secondary | ICD-10-CM | POA: Diagnosis not present

## 2021-10-17 DIAGNOSIS — N186 End stage renal disease: Secondary | ICD-10-CM | POA: Diagnosis not present

## 2021-10-18 DIAGNOSIS — M1711 Unilateral primary osteoarthritis, right knee: Secondary | ICD-10-CM | POA: Diagnosis not present

## 2021-10-18 DIAGNOSIS — I70242 Atherosclerosis of native arteries of left leg with ulceration of calf: Secondary | ICD-10-CM | POA: Diagnosis not present

## 2021-10-18 DIAGNOSIS — E11622 Type 2 diabetes mellitus with other skin ulcer: Secondary | ICD-10-CM | POA: Diagnosis not present

## 2021-10-18 DIAGNOSIS — Z7902 Long term (current) use of antithrombotics/antiplatelets: Secondary | ICD-10-CM | POA: Diagnosis not present

## 2021-10-18 DIAGNOSIS — E785 Hyperlipidemia, unspecified: Secondary | ICD-10-CM | POA: Diagnosis not present

## 2021-10-18 DIAGNOSIS — Z794 Long term (current) use of insulin: Secondary | ICD-10-CM | POA: Diagnosis not present

## 2021-10-18 DIAGNOSIS — I350 Nonrheumatic aortic (valve) stenosis: Secondary | ICD-10-CM | POA: Diagnosis not present

## 2021-10-18 DIAGNOSIS — Z720 Tobacco use: Secondary | ICD-10-CM | POA: Diagnosis not present

## 2021-10-18 DIAGNOSIS — E1122 Type 2 diabetes mellitus with diabetic chronic kidney disease: Secondary | ICD-10-CM | POA: Diagnosis not present

## 2021-10-18 DIAGNOSIS — I700 Atherosclerosis of aorta: Secondary | ICD-10-CM | POA: Diagnosis not present

## 2021-10-18 DIAGNOSIS — L97228 Non-pressure chronic ulcer of left calf with other specified severity: Secondary | ICD-10-CM | POA: Diagnosis not present

## 2021-10-18 DIAGNOSIS — E1142 Type 2 diabetes mellitus with diabetic polyneuropathy: Secondary | ICD-10-CM | POA: Diagnosis not present

## 2021-10-18 DIAGNOSIS — E039 Hypothyroidism, unspecified: Secondary | ICD-10-CM | POA: Diagnosis not present

## 2021-10-18 DIAGNOSIS — F331 Major depressive disorder, recurrent, moderate: Secondary | ICD-10-CM | POA: Diagnosis not present

## 2021-10-18 DIAGNOSIS — J449 Chronic obstructive pulmonary disease, unspecified: Secondary | ICD-10-CM | POA: Diagnosis not present

## 2021-10-18 DIAGNOSIS — K5900 Constipation, unspecified: Secondary | ICD-10-CM | POA: Diagnosis not present

## 2021-10-18 DIAGNOSIS — Z992 Dependence on renal dialysis: Secondary | ICD-10-CM | POA: Diagnosis not present

## 2021-10-18 DIAGNOSIS — Z89611 Acquired absence of right leg above knee: Secondary | ICD-10-CM | POA: Diagnosis not present

## 2021-10-18 DIAGNOSIS — I120 Hypertensive chronic kidney disease with stage 5 chronic kidney disease or end stage renal disease: Secondary | ICD-10-CM | POA: Diagnosis not present

## 2021-10-18 DIAGNOSIS — K2100 Gastro-esophageal reflux disease with esophagitis, without bleeding: Secondary | ICD-10-CM | POA: Diagnosis not present

## 2021-10-18 DIAGNOSIS — E1151 Type 2 diabetes mellitus with diabetic peripheral angiopathy without gangrene: Secondary | ICD-10-CM | POA: Diagnosis not present

## 2021-10-18 DIAGNOSIS — N186 End stage renal disease: Secondary | ICD-10-CM | POA: Diagnosis not present

## 2021-10-19 DIAGNOSIS — N186 End stage renal disease: Secondary | ICD-10-CM | POA: Diagnosis not present

## 2021-10-19 DIAGNOSIS — N2581 Secondary hyperparathyroidism of renal origin: Secondary | ICD-10-CM | POA: Diagnosis not present

## 2021-10-19 DIAGNOSIS — F17210 Nicotine dependence, cigarettes, uncomplicated: Secondary | ICD-10-CM | POA: Diagnosis not present

## 2021-10-19 DIAGNOSIS — Z992 Dependence on renal dialysis: Secondary | ICD-10-CM | POA: Diagnosis not present

## 2021-10-19 DIAGNOSIS — D631 Anemia in chronic kidney disease: Secondary | ICD-10-CM | POA: Diagnosis not present

## 2021-10-19 DIAGNOSIS — D509 Iron deficiency anemia, unspecified: Secondary | ICD-10-CM | POA: Diagnosis not present

## 2021-10-19 DIAGNOSIS — R609 Edema, unspecified: Secondary | ICD-10-CM | POA: Diagnosis not present

## 2021-10-21 DIAGNOSIS — Z992 Dependence on renal dialysis: Secondary | ICD-10-CM | POA: Diagnosis not present

## 2021-10-21 DIAGNOSIS — N2581 Secondary hyperparathyroidism of renal origin: Secondary | ICD-10-CM | POA: Diagnosis not present

## 2021-10-21 DIAGNOSIS — D631 Anemia in chronic kidney disease: Secondary | ICD-10-CM | POA: Diagnosis not present

## 2021-10-21 DIAGNOSIS — D509 Iron deficiency anemia, unspecified: Secondary | ICD-10-CM | POA: Diagnosis not present

## 2021-10-21 DIAGNOSIS — N186 End stage renal disease: Secondary | ICD-10-CM | POA: Diagnosis not present

## 2021-10-24 DIAGNOSIS — Z992 Dependence on renal dialysis: Secondary | ICD-10-CM | POA: Diagnosis not present

## 2021-10-24 DIAGNOSIS — D509 Iron deficiency anemia, unspecified: Secondary | ICD-10-CM | POA: Diagnosis not present

## 2021-10-24 DIAGNOSIS — N186 End stage renal disease: Secondary | ICD-10-CM | POA: Diagnosis not present

## 2021-10-24 DIAGNOSIS — D631 Anemia in chronic kidney disease: Secondary | ICD-10-CM | POA: Diagnosis not present

## 2021-10-24 DIAGNOSIS — N2581 Secondary hyperparathyroidism of renal origin: Secondary | ICD-10-CM | POA: Diagnosis not present

## 2021-10-25 DIAGNOSIS — E1142 Type 2 diabetes mellitus with diabetic polyneuropathy: Secondary | ICD-10-CM | POA: Diagnosis not present

## 2021-10-25 DIAGNOSIS — I70242 Atherosclerosis of native arteries of left leg with ulceration of calf: Secondary | ICD-10-CM | POA: Diagnosis not present

## 2021-10-25 DIAGNOSIS — M1711 Unilateral primary osteoarthritis, right knee: Secondary | ICD-10-CM | POA: Diagnosis not present

## 2021-10-25 DIAGNOSIS — E11622 Type 2 diabetes mellitus with other skin ulcer: Secondary | ICD-10-CM | POA: Diagnosis not present

## 2021-10-25 DIAGNOSIS — E1151 Type 2 diabetes mellitus with diabetic peripheral angiopathy without gangrene: Secondary | ICD-10-CM | POA: Diagnosis not present

## 2021-10-25 DIAGNOSIS — L97228 Non-pressure chronic ulcer of left calf with other specified severity: Secondary | ICD-10-CM | POA: Diagnosis not present

## 2021-10-26 DIAGNOSIS — R059 Cough, unspecified: Secondary | ICD-10-CM | POA: Diagnosis not present

## 2021-10-26 DIAGNOSIS — N2581 Secondary hyperparathyroidism of renal origin: Secondary | ICD-10-CM | POA: Diagnosis not present

## 2021-10-26 DIAGNOSIS — D631 Anemia in chronic kidney disease: Secondary | ICD-10-CM | POA: Diagnosis not present

## 2021-10-26 DIAGNOSIS — R051 Acute cough: Secondary | ICD-10-CM | POA: Diagnosis not present

## 2021-10-26 DIAGNOSIS — Z20822 Contact with and (suspected) exposure to covid-19: Secondary | ICD-10-CM | POA: Diagnosis not present

## 2021-10-26 DIAGNOSIS — D509 Iron deficiency anemia, unspecified: Secondary | ICD-10-CM | POA: Diagnosis not present

## 2021-10-26 DIAGNOSIS — Z992 Dependence on renal dialysis: Secondary | ICD-10-CM | POA: Diagnosis not present

## 2021-10-26 DIAGNOSIS — N186 End stage renal disease: Secondary | ICD-10-CM | POA: Diagnosis not present

## 2021-10-28 DIAGNOSIS — N186 End stage renal disease: Secondary | ICD-10-CM | POA: Diagnosis not present

## 2021-10-28 DIAGNOSIS — D509 Iron deficiency anemia, unspecified: Secondary | ICD-10-CM | POA: Diagnosis not present

## 2021-10-28 DIAGNOSIS — Z992 Dependence on renal dialysis: Secondary | ICD-10-CM | POA: Diagnosis not present

## 2021-10-28 DIAGNOSIS — D631 Anemia in chronic kidney disease: Secondary | ICD-10-CM | POA: Diagnosis not present

## 2021-10-28 DIAGNOSIS — N2581 Secondary hyperparathyroidism of renal origin: Secondary | ICD-10-CM | POA: Diagnosis not present

## 2021-10-30 ENCOUNTER — Ambulatory Visit (INDEPENDENT_AMBULATORY_CARE_PROVIDER_SITE_OTHER): Payer: Medicare Other | Admitting: Gastroenterology

## 2021-10-30 ENCOUNTER — Other Ambulatory Visit: Payer: Self-pay

## 2021-10-30 ENCOUNTER — Encounter (INDEPENDENT_AMBULATORY_CARE_PROVIDER_SITE_OTHER): Payer: Self-pay | Admitting: Gastroenterology

## 2021-10-30 VITALS — BP 132/77 | HR 76 | Ht 76.0 in

## 2021-10-30 DIAGNOSIS — R109 Unspecified abdominal pain: Secondary | ICD-10-CM

## 2021-10-30 DIAGNOSIS — L97228 Non-pressure chronic ulcer of left calf with other specified severity: Secondary | ICD-10-CM | POA: Diagnosis not present

## 2021-10-30 DIAGNOSIS — K5903 Drug induced constipation: Secondary | ICD-10-CM

## 2021-10-30 DIAGNOSIS — K838 Other specified diseases of biliary tract: Secondary | ICD-10-CM

## 2021-10-30 DIAGNOSIS — E1142 Type 2 diabetes mellitus with diabetic polyneuropathy: Secondary | ICD-10-CM | POA: Diagnosis not present

## 2021-10-30 DIAGNOSIS — I70242 Atherosclerosis of native arteries of left leg with ulceration of calf: Secondary | ICD-10-CM | POA: Diagnosis not present

## 2021-10-30 DIAGNOSIS — E11622 Type 2 diabetes mellitus with other skin ulcer: Secondary | ICD-10-CM | POA: Diagnosis not present

## 2021-10-30 DIAGNOSIS — E1151 Type 2 diabetes mellitus with diabetic peripheral angiopathy without gangrene: Secondary | ICD-10-CM | POA: Diagnosis not present

## 2021-10-30 DIAGNOSIS — M1711 Unilateral primary osteoarthritis, right knee: Secondary | ICD-10-CM | POA: Diagnosis not present

## 2021-10-30 MED ORDER — DICYCLOMINE HCL 10 MG PO CAPS: 10.0000 mg | ORAL_CAPSULE | Freq: Two times a day (BID) | ORAL | 2 refills | Status: DC | PRN

## 2021-10-30 NOTE — Patient Instructions (Addendum)
Continue stool softener three times a day, take Miralqax 1 capful every day ?Schedule CT angio abdomen/pelvis with IV contrast ?Schedule MRCP ?Perform blood workup ?Start Bentyl 1 tablet q12h as needed for abdominal pain ? ? ?

## 2021-10-30 NOTE — Progress Notes (Signed)
Maylon Peppers, M.D. ?Gastroenterology & Hepatology ?[facility] ?[address] ?[place], [place] [address] ?Primary Care Physician: ?Caryl Bis, MD ?[address] ?[place] [address] ? ?Referring MD: PCP ? ?Chief Complaint: Abdominal pain ? ?History of Present Illness: ?MYLIK PRO is a 71 y.o. male multiple comorbidities including coronary artery disease, anxiety, depression, end-stage renal disease On HD, COPD, hypertension, GERD, hypothyroidism, severe peripheral vascular disease s/p R AKA, PTSD and diabetes who presents for evaluation of abdominal pain. ? ?Patient reports that since June 2022 he presented recurrent eepisodes of dry heaving. Stats that since then he has presented recurrent episodes of abdominal pain in his upper abdomen, which he never had before. The patient reports that the pain may radiate to the lower abdomen intermittently, but sometimes will describe the abdominal pain coming from the lower abdomen. Patient reports that he has presented more episodes of abdominal pain in his lower abdomen which he describes as tight pressure and bloating. States that when he moves his bowels he feels some relief of his pain. He is currently having a BM every day while taking Colace with each meal, but sometimes he may skip  a few days. Usually feels some pain relief after moving his bowels. He usually does not have pain when eating but usually before eating.  ? ?He has also noticed that he has been more sensitive to different type of smells, for example the smell of food cooking. Has noticed the nausea has been more frequent but is not having dry heaving recently. ? ?The patient denies having any fever, chills, hematochezia, melena, hematemesis, diarrhea, jaundice, pruritus. Has lost some weight as he has presented decreased appetite. ? ?Most recent abdominal imaging was an MRCP without contrast on 07/03/2021.  He had mildly dilated CBD up to 8 mm no  evidence of obstruction.  There was presence of hepatosplenomegaly with liver measuring 18.6 cm.  Pancreas was mildly atrophic findings probable continued cystic lesion in the uncinate process of the head of the pancreas.  Pancreatic duct measured 5 mm and the pancreatic head.  Most recent blood work-up from 10/25/2020 showed creatinine 8.85, BUN 40, normal electrolytes, CBC with hemoglobin of 8.1, MCV 101, follow cell count 5.6 and platelets of 127.  Most recent iron stores from 2020: Normal iron 52, TIBC of 242, ferritin was not checked and saturation was 21%.  Most recent LFTs from March 2022 showed AST of 29, ALT 24, total bilirubin of 0.5, alkaline phosphatase 84.   ? ?Last EGD: never ?Last Colonoscopy:2013 -  ?Prep fair to satisfactory. ?Few small output and less scattered throughout the colon. ?3 mm polyp of aortic wall biopsy from cecum. ?8 mm polyp snared from the ascending colon. ?6 mm polyp snared from the hepatic flexure. ?Another 10 mm polyp snared from hepatic flexure. ?Small polyps could not be removed on today's exam. These are located at the hepatic flexure and sigmoid colon. ?Hemorrhoids above and below the dentate line.  ? ?Path: Diagnosis ?1. Colon, polyp(s), cecal ?BENIGN LYMPHOID POLYP. NO ADENOMATOUS CHANGE OR MALIGNANCY. ?2. Colon, polyp(s), ascending ?TUBULAR ADENOMA. NO HIGH GRADE DYSPLASIA OR MALIGNANCY IDENTIFIED. ?3. Colon, polyp(s), hepatic flexure ?TWO TUBULAR ADENOMAS. NO HIGH GRADE DYSPLASIA OR MALIGNANCY IDENTIFIED. ? ?FHx: neg for any gastrointestinal/liver disease, no malignancies ?Social: smokes a little more than a 1/2 pack a day, neg alcohol or illicit drug use ?Surgical: hernia repair ? ?Past Medical History: ?Past Medical History:  ?Diagnosis Date  ? Anemia   ?  Anxiety   ? Arthritis   ? CAD (coronary artery disease)   ? Myoview August 2021 with evidence of large inferior scar but no active ischemia  ? COPD (chronic obstructive pulmonary disease) ([place])   ? Depression   ? ESRD  on hemodialysis ([place])   ? Essential hypertension   ? GERD (gastroesophageal reflux disease)   ? H/O hiatal hernia   ? Headache(784.0)   ? History of kidney stones   ? History of pneumonia   ? Hypothyroidism   ? Neuropathy   ? Non-healing wound of amputation stump ([place])   ? Right  ? Peripheral vascular disease ([place])   ? PTSD (post-traumatic stress disorder)   ? Type 2 diabetes mellitus ([place])   ? ? ?Past Surgical History: ?Past Surgical History:  ?Procedure Laterality Date  ? A/V FISTULAGRAM Left 01/16/2019  ? Procedure: A/V FISTULAGRAM;  Surgeon: [name], MD;  Location: [facility];  Service: Cardiovascular;  Laterality: Left;  ? ABDOMINAL AORTOGRAM W/LOWER EXTREMITY Bilateral 03/18/2020  ? Procedure: ABDOMINAL AORTOGRAM W/LOWER EXTREMITY;  Surgeon: [name], MD;  Location: [facility];  Service: Cardiovascular;  Laterality: Bilateral;  ? AMPUTATION Right 12/01/2018  ? Procedure: RIGHT AMPUTATION BELOW KNEE;  Surgeon: [name], MD;  Location: [facility] OR;  Service: Vascular;  Laterality: Right;  ? AMPUTATION Right 04/27/2019  ? Procedure: AMPUTATION BELOW KNEE REVISION;  Surgeon: [name], MD;  Location: [facility] OR;  Service: Vascular;  Laterality: Right;  ? AMPUTATION Right 04/30/2019  ? Procedure: AMPUTATION ABOVE KNEE - RIGHT;  Surgeon: Waynetta Sandy, MD;  Location: [facility];  Service: Vascular;  Laterality: Right;  ? APPLICATION OF WOUND VAC Right 04/27/2019  ? Procedure: APPLICATION OF WOUND VAC;  Surgeon: [name], MD;  Location: [facility];  Service: Vascular;  Laterality: Right;  ? AV FISTULA PLACEMENT  2012  ?    left arm   ? AV FISTULA PLACEMENT Left 12/18/2012  ? Procedure: ARTERIOVENOUS (AV) FISTULA CREATION;  Surgeon: Angelia Mould, MD;  Location: [facility];  Service: Vascular;  Laterality: Left;  ? COLONOSCOPY  10/26/2011  ? Procedure: COLONOSCOPY;  Surgeon: Rogene Houston, MD;  [facility];  Service: Endoscopy;  Laterality: N/A;  730  ?  EMBOLECTOMY Left 12/09/2012  ? Procedure: EMBOLECTOMY;  Surgeon: Serafina Mitchell, MD;  Location: [facility] CATH LAB;  Service: Cardiovascular;  Laterality: Left;  left arm venous embolization  ? ENDARTERECTOMY Left 10/24/2020  ? Procedure: LEFT CAROTID ENDARTERECTOMY;  Surgeon: [name], MD;  Location: [facility] OR;  Service: Vascular;  Laterality: Left;  ? ENDARTERECTOMY FEMORAL Left 04/04/2020  ? Procedure: ENDARTERECTOMY COMMON FEMORAL;  Surgeon: [name], MD;  Location: [facility] OR;  Service: Vascular;  Laterality: Left;  ? ESOPHAGOGASTRODUODENOSCOPY (EGD) WITH ESOPHAGEAL DILATION N/A 04/23/2013  ? Procedure: ESOPHAGOGASTRODUODENOSCOPY (EGD) WITH ESOPHAGEAL DILATION;  Surgeon: Rogene Houston, MD;  [facility];  Service: Endoscopy;  Laterality: N/A;  200-moved to 930   ? FEMORAL-POPLITEAL BYPASS GRAFT Left 04/04/2020  ? Procedure: FEMORAL- BELOW KNEE POPLITEAL BYPASS GRAFT NON REVERSED VEIN;  Surgeon: [name], MD;  Location: [facility];  Service: Vascular;  Laterality: Left;  ? FISTULA SUPERFICIALIZATION Left 06/18/2013  ? Procedure: FISTULA SUPERFICIALIZATION & LIGATION BRANCH X 1;  Surgeon: Mal Misty, MD;  Location: [facility];  Service: Vascular;  Laterality: Left;  ? GROIN DEBRIDEMENT Left 04/29/2020  ? Procedure: EXPLORATION LEFT GROIN WITH DEBRIDEMENT;  Surgeon: Donnetta Hutching,  Arvilla Meres, MD;  Location: [facility];  Service: Vascular;  Laterality: Left;  ? HEMATOMA EVACUATION Right 02/11/2017  ? Procedure: EVACUATION HEMATOMA RIGHT GROIN, Repair of Right Pseudo-anerysm.;  Surgeon: [name], MD;  Location: [facility] OR;  Service: Vascular;  Laterality: Right;  ? INGUINAL HERNIA REPAIR    ? ,  times   2  ? INSERTION OF DIALYSIS CATHETER Left 12/18/2012  ? Procedure: INSERTION OF DIALYSIS CATHETER;  Surgeon: Angelia Mould, MD;  Location: [facility];  Service: Vascular;  Laterality: Left;  ? KNEE ARTHROSCOPY  2011  ? Right Knee  ? LOWER EXTREMITY ANGIOGRAPHY N/A 02/11/2017  ? Procedure: Lower Extremity Angiography;   Surgeon: Lorretta Harp, MD;  Location: [facility];  Service: Cardiovascular;  Laterality: N/A;  ? PERIPHERAL ATHRECTOMY  02/11/2017  ? PERIPHERAL VASCULAR ATHERECTOMY Left 02/11/2017  ? Procedure: Per

## 2021-10-31 DIAGNOSIS — D509 Iron deficiency anemia, unspecified: Secondary | ICD-10-CM | POA: Diagnosis not present

## 2021-10-31 DIAGNOSIS — N2581 Secondary hyperparathyroidism of renal origin: Secondary | ICD-10-CM | POA: Diagnosis not present

## 2021-10-31 DIAGNOSIS — D631 Anemia in chronic kidney disease: Secondary | ICD-10-CM | POA: Diagnosis not present

## 2021-10-31 DIAGNOSIS — N186 End stage renal disease: Secondary | ICD-10-CM | POA: Diagnosis not present

## 2021-10-31 DIAGNOSIS — Z992 Dependence on renal dialysis: Secondary | ICD-10-CM | POA: Diagnosis not present

## 2021-11-01 DIAGNOSIS — L97228 Non-pressure chronic ulcer of left calf with other specified severity: Secondary | ICD-10-CM | POA: Diagnosis not present

## 2021-11-01 DIAGNOSIS — M1711 Unilateral primary osteoarthritis, right knee: Secondary | ICD-10-CM | POA: Diagnosis not present

## 2021-11-01 DIAGNOSIS — I70242 Atherosclerosis of native arteries of left leg with ulceration of calf: Secondary | ICD-10-CM | POA: Diagnosis not present

## 2021-11-01 DIAGNOSIS — E11622 Type 2 diabetes mellitus with other skin ulcer: Secondary | ICD-10-CM | POA: Diagnosis not present

## 2021-11-01 DIAGNOSIS — E1151 Type 2 diabetes mellitus with diabetic peripheral angiopathy without gangrene: Secondary | ICD-10-CM | POA: Diagnosis not present

## 2021-11-01 DIAGNOSIS — E1142 Type 2 diabetes mellitus with diabetic polyneuropathy: Secondary | ICD-10-CM | POA: Diagnosis not present

## 2021-11-02 DIAGNOSIS — Z992 Dependence on renal dialysis: Secondary | ICD-10-CM | POA: Diagnosis not present

## 2021-11-02 DIAGNOSIS — D509 Iron deficiency anemia, unspecified: Secondary | ICD-10-CM | POA: Diagnosis not present

## 2021-11-02 DIAGNOSIS — D631 Anemia in chronic kidney disease: Secondary | ICD-10-CM | POA: Diagnosis not present

## 2021-11-02 DIAGNOSIS — N186 End stage renal disease: Secondary | ICD-10-CM | POA: Diagnosis not present

## 2021-11-02 DIAGNOSIS — N2581 Secondary hyperparathyroidism of renal origin: Secondary | ICD-10-CM | POA: Diagnosis not present

## 2021-11-04 DIAGNOSIS — Z992 Dependence on renal dialysis: Secondary | ICD-10-CM | POA: Diagnosis not present

## 2021-11-04 DIAGNOSIS — N186 End stage renal disease: Secondary | ICD-10-CM | POA: Diagnosis not present

## 2021-11-04 DIAGNOSIS — D509 Iron deficiency anemia, unspecified: Secondary | ICD-10-CM | POA: Diagnosis not present

## 2021-11-04 DIAGNOSIS — D631 Anemia in chronic kidney disease: Secondary | ICD-10-CM | POA: Diagnosis not present

## 2021-11-04 DIAGNOSIS — N2581 Secondary hyperparathyroidism of renal origin: Secondary | ICD-10-CM | POA: Diagnosis not present

## 2021-11-06 DIAGNOSIS — E11622 Type 2 diabetes mellitus with other skin ulcer: Secondary | ICD-10-CM | POA: Diagnosis not present

## 2021-11-06 DIAGNOSIS — E1151 Type 2 diabetes mellitus with diabetic peripheral angiopathy without gangrene: Secondary | ICD-10-CM | POA: Diagnosis not present

## 2021-11-06 DIAGNOSIS — L97228 Non-pressure chronic ulcer of left calf with other specified severity: Secondary | ICD-10-CM | POA: Diagnosis not present

## 2021-11-06 DIAGNOSIS — E1142 Type 2 diabetes mellitus with diabetic polyneuropathy: Secondary | ICD-10-CM | POA: Diagnosis not present

## 2021-11-06 DIAGNOSIS — M1711 Unilateral primary osteoarthritis, right knee: Secondary | ICD-10-CM | POA: Diagnosis not present

## 2021-11-06 DIAGNOSIS — I70242 Atherosclerosis of native arteries of left leg with ulceration of calf: Secondary | ICD-10-CM | POA: Diagnosis not present

## 2021-11-07 DIAGNOSIS — Z794 Long term (current) use of insulin: Secondary | ICD-10-CM | POA: Diagnosis not present

## 2021-11-07 DIAGNOSIS — D631 Anemia in chronic kidney disease: Secondary | ICD-10-CM | POA: Diagnosis not present

## 2021-11-07 DIAGNOSIS — E119 Type 2 diabetes mellitus without complications: Secondary | ICD-10-CM | POA: Diagnosis not present

## 2021-11-07 DIAGNOSIS — N186 End stage renal disease: Secondary | ICD-10-CM | POA: Diagnosis not present

## 2021-11-07 DIAGNOSIS — Z992 Dependence on renal dialysis: Secondary | ICD-10-CM | POA: Diagnosis not present

## 2021-11-07 DIAGNOSIS — N2581 Secondary hyperparathyroidism of renal origin: Secondary | ICD-10-CM | POA: Diagnosis not present

## 2021-11-07 DIAGNOSIS — D509 Iron deficiency anemia, unspecified: Secondary | ICD-10-CM | POA: Diagnosis not present

## 2021-11-08 DIAGNOSIS — E1142 Type 2 diabetes mellitus with diabetic polyneuropathy: Secondary | ICD-10-CM | POA: Diagnosis not present

## 2021-11-08 DIAGNOSIS — E11622 Type 2 diabetes mellitus with other skin ulcer: Secondary | ICD-10-CM | POA: Diagnosis not present

## 2021-11-08 DIAGNOSIS — I70242 Atherosclerosis of native arteries of left leg with ulceration of calf: Secondary | ICD-10-CM | POA: Diagnosis not present

## 2021-11-08 DIAGNOSIS — L97228 Non-pressure chronic ulcer of left calf with other specified severity: Secondary | ICD-10-CM | POA: Diagnosis not present

## 2021-11-08 DIAGNOSIS — E1151 Type 2 diabetes mellitus with diabetic peripheral angiopathy without gangrene: Secondary | ICD-10-CM | POA: Diagnosis not present

## 2021-11-08 DIAGNOSIS — M1711 Unilateral primary osteoarthritis, right knee: Secondary | ICD-10-CM | POA: Diagnosis not present

## 2021-11-09 ENCOUNTER — Other Ambulatory Visit (INDEPENDENT_AMBULATORY_CARE_PROVIDER_SITE_OTHER): Payer: Self-pay

## 2021-11-09 DIAGNOSIS — N2581 Secondary hyperparathyroidism of renal origin: Secondary | ICD-10-CM | POA: Diagnosis not present

## 2021-11-09 DIAGNOSIS — D631 Anemia in chronic kidney disease: Secondary | ICD-10-CM | POA: Diagnosis not present

## 2021-11-09 DIAGNOSIS — Z992 Dependence on renal dialysis: Secondary | ICD-10-CM | POA: Diagnosis not present

## 2021-11-09 DIAGNOSIS — Z20822 Contact with and (suspected) exposure to covid-19: Secondary | ICD-10-CM | POA: Diagnosis not present

## 2021-11-09 DIAGNOSIS — D509 Iron deficiency anemia, unspecified: Secondary | ICD-10-CM | POA: Diagnosis not present

## 2021-11-09 DIAGNOSIS — N186 End stage renal disease: Secondary | ICD-10-CM | POA: Diagnosis not present

## 2021-11-09 DIAGNOSIS — Z01812 Encounter for preprocedural laboratory examination: Secondary | ICD-10-CM

## 2021-11-10 DIAGNOSIS — K551 Chronic vascular disorders of intestine: Secondary | ICD-10-CM | POA: Diagnosis not present

## 2021-11-10 DIAGNOSIS — I774 Celiac artery compression syndrome: Secondary | ICD-10-CM | POA: Diagnosis not present

## 2021-11-10 DIAGNOSIS — Z20828 Contact with and (suspected) exposure to other viral communicable diseases: Secondary | ICD-10-CM | POA: Diagnosis not present

## 2021-11-10 DIAGNOSIS — N186 End stage renal disease: Secondary | ICD-10-CM | POA: Diagnosis not present

## 2021-11-10 DIAGNOSIS — I771 Stricture of artery: Secondary | ICD-10-CM | POA: Diagnosis not present

## 2021-11-10 DIAGNOSIS — Z992 Dependence on renal dialysis: Secondary | ICD-10-CM | POA: Diagnosis not present

## 2021-11-10 DIAGNOSIS — I714 Abdominal aortic aneurysm, without rupture, unspecified: Secondary | ICD-10-CM | POA: Diagnosis not present

## 2021-11-11 DIAGNOSIS — D631 Anemia in chronic kidney disease: Secondary | ICD-10-CM | POA: Diagnosis not present

## 2021-11-11 DIAGNOSIS — Z992 Dependence on renal dialysis: Secondary | ICD-10-CM | POA: Diagnosis not present

## 2021-11-11 DIAGNOSIS — N250 Renal osteodystrophy: Secondary | ICD-10-CM | POA: Diagnosis not present

## 2021-11-11 DIAGNOSIS — E559 Vitamin D deficiency, unspecified: Secondary | ICD-10-CM | POA: Diagnosis not present

## 2021-11-11 DIAGNOSIS — N2581 Secondary hyperparathyroidism of renal origin: Secondary | ICD-10-CM | POA: Diagnosis not present

## 2021-11-11 DIAGNOSIS — N186 End stage renal disease: Secondary | ICD-10-CM | POA: Diagnosis not present

## 2021-11-11 DIAGNOSIS — D509 Iron deficiency anemia, unspecified: Secondary | ICD-10-CM | POA: Diagnosis not present

## 2021-11-13 ENCOUNTER — Telehealth (INDEPENDENT_AMBULATORY_CARE_PROVIDER_SITE_OTHER): Payer: Self-pay | Admitting: Gastroenterology

## 2021-11-13 DIAGNOSIS — E11622 Type 2 diabetes mellitus with other skin ulcer: Secondary | ICD-10-CM | POA: Diagnosis not present

## 2021-11-13 DIAGNOSIS — M1711 Unilateral primary osteoarthritis, right knee: Secondary | ICD-10-CM | POA: Diagnosis not present

## 2021-11-13 DIAGNOSIS — E1151 Type 2 diabetes mellitus with diabetic peripheral angiopathy without gangrene: Secondary | ICD-10-CM | POA: Diagnosis not present

## 2021-11-13 DIAGNOSIS — I70242 Atherosclerosis of native arteries of left leg with ulceration of calf: Secondary | ICD-10-CM | POA: Diagnosis not present

## 2021-11-13 DIAGNOSIS — E1142 Type 2 diabetes mellitus with diabetic polyneuropathy: Secondary | ICD-10-CM | POA: Diagnosis not present

## 2021-11-13 DIAGNOSIS — L97228 Non-pressure chronic ulcer of left calf with other specified severity: Secondary | ICD-10-CM | POA: Diagnosis not present

## 2021-11-13 NOTE — Telephone Encounter (Signed)
I received the results from most recent CT angio of the abdomen and pelvis with and without IV contrast that showed: ? ?VASCULAR  ? ?1. Positive for high-grade stenoses at the origins of the celiac,  ?SMA and IMA artery secondary to heavily calcified atherosclerotic  ?plaque. Hemodynamically significant stenosis of all visceral  ?arteries could certainly lead to clinical symptoms of chronic  ?mesenteric ischemia.  ?2. 3.3 cm abdominal aortic aneurysm. Recommend follow-up every 3  ?years. This recommendation follows ACR consensus guidelines: White  ?Paper of the ACR Incidental Findings Committee II on Vascular  ?Findings. J Am Coll Radiol 2013; 10:789-794.  ?3. Extensive calcified atherosclerotic plaque and medial  ?arteriosclerosis throughout all visualized vascular beds consistent  ?with longstanding chronic renal disease.  ?4. Multifocal hemodynamically significant stenoses in the iliac and  ?femoral arteries as detailed above.  ?5. Suspect prior left common femoral endarterectomy.  ? ?NON-VASCULAR  ? ?1. Cardiomegaly with evidence of elevated right heart pressures.  ?2. Small bilateral pleural effusions, anasarca, presacral edema and  ?trace ascites all favored to reflect volume overload given known  ?history of hemodialysis.  ?3. Colonic diverticular disease without CT evidence of active  ?inflammation.  ?4. Multilevel degenerative disc disease.  ? ?Given these abnormalities concerning for chronic mesenteric ischemia, I called the patient to discuss the possibility of being evaluated by interventional radiology.  However, there was no answer after a call to the numbers listed in his chart.  I left to voice messages asking for a call back before I start the referral process for evaluation by interventional radiology. ?

## 2021-11-14 DIAGNOSIS — N2581 Secondary hyperparathyroidism of renal origin: Secondary | ICD-10-CM | POA: Diagnosis not present

## 2021-11-14 DIAGNOSIS — D631 Anemia in chronic kidney disease: Secondary | ICD-10-CM | POA: Diagnosis not present

## 2021-11-14 DIAGNOSIS — N186 End stage renal disease: Secondary | ICD-10-CM | POA: Diagnosis not present

## 2021-11-14 DIAGNOSIS — Z79891 Long term (current) use of opiate analgesic: Secondary | ICD-10-CM | POA: Diagnosis not present

## 2021-11-14 DIAGNOSIS — D509 Iron deficiency anemia, unspecified: Secondary | ICD-10-CM | POA: Diagnosis not present

## 2021-11-14 DIAGNOSIS — Z992 Dependence on renal dialysis: Secondary | ICD-10-CM | POA: Diagnosis not present

## 2021-11-14 DIAGNOSIS — N250 Renal osteodystrophy: Secondary | ICD-10-CM | POA: Diagnosis not present

## 2021-11-14 DIAGNOSIS — G629 Polyneuropathy, unspecified: Secondary | ICD-10-CM | POA: Diagnosis not present

## 2021-11-14 DIAGNOSIS — M545 Low back pain, unspecified: Secondary | ICD-10-CM | POA: Diagnosis not present

## 2021-11-14 DIAGNOSIS — G894 Chronic pain syndrome: Secondary | ICD-10-CM | POA: Diagnosis not present

## 2021-11-16 DIAGNOSIS — Z992 Dependence on renal dialysis: Secondary | ICD-10-CM | POA: Diagnosis not present

## 2021-11-16 DIAGNOSIS — E1151 Type 2 diabetes mellitus with diabetic peripheral angiopathy without gangrene: Secondary | ICD-10-CM | POA: Diagnosis not present

## 2021-11-16 DIAGNOSIS — D509 Iron deficiency anemia, unspecified: Secondary | ICD-10-CM | POA: Diagnosis not present

## 2021-11-16 DIAGNOSIS — M1711 Unilateral primary osteoarthritis, right knee: Secondary | ICD-10-CM | POA: Diagnosis not present

## 2021-11-16 DIAGNOSIS — I70242 Atherosclerosis of native arteries of left leg with ulceration of calf: Secondary | ICD-10-CM | POA: Diagnosis not present

## 2021-11-16 DIAGNOSIS — E1142 Type 2 diabetes mellitus with diabetic polyneuropathy: Secondary | ICD-10-CM | POA: Diagnosis not present

## 2021-11-16 DIAGNOSIS — N2581 Secondary hyperparathyroidism of renal origin: Secondary | ICD-10-CM | POA: Diagnosis not present

## 2021-11-16 DIAGNOSIS — E11622 Type 2 diabetes mellitus with other skin ulcer: Secondary | ICD-10-CM | POA: Diagnosis not present

## 2021-11-16 DIAGNOSIS — D631 Anemia in chronic kidney disease: Secondary | ICD-10-CM | POA: Diagnosis not present

## 2021-11-16 DIAGNOSIS — N186 End stage renal disease: Secondary | ICD-10-CM | POA: Diagnosis not present

## 2021-11-16 DIAGNOSIS — L97228 Non-pressure chronic ulcer of left calf with other specified severity: Secondary | ICD-10-CM | POA: Diagnosis not present

## 2021-11-16 DIAGNOSIS — N250 Renal osteodystrophy: Secondary | ICD-10-CM | POA: Diagnosis not present

## 2021-11-18 DIAGNOSIS — N186 End stage renal disease: Secondary | ICD-10-CM | POA: Diagnosis not present

## 2021-11-18 DIAGNOSIS — D631 Anemia in chronic kidney disease: Secondary | ICD-10-CM | POA: Diagnosis not present

## 2021-11-18 DIAGNOSIS — D509 Iron deficiency anemia, unspecified: Secondary | ICD-10-CM | POA: Diagnosis not present

## 2021-11-18 DIAGNOSIS — N250 Renal osteodystrophy: Secondary | ICD-10-CM | POA: Diagnosis not present

## 2021-11-18 DIAGNOSIS — Z992 Dependence on renal dialysis: Secondary | ICD-10-CM | POA: Diagnosis not present

## 2021-11-18 DIAGNOSIS — N2581 Secondary hyperparathyroidism of renal origin: Secondary | ICD-10-CM | POA: Diagnosis not present

## 2021-11-19 ENCOUNTER — Emergency Department (HOSPITAL_COMMUNITY): Payer: Medicare Other

## 2021-11-19 ENCOUNTER — Other Ambulatory Visit: Payer: Self-pay

## 2021-11-19 ENCOUNTER — Inpatient Hospital Stay (HOSPITAL_COMMUNITY)
Admission: EM | Admit: 2021-11-19 | Discharge: 2021-11-24 | DRG: 286 | Disposition: A | Discharge status: Home Health | Payer: Medicare Other | Attending: Internal Medicine | Admitting: Internal Medicine

## 2021-11-19 ENCOUNTER — Encounter (HOSPITAL_COMMUNITY): Payer: Self-pay

## 2021-11-19 DIAGNOSIS — K219 Gastro-esophageal reflux disease without esophagitis: Secondary | ICD-10-CM | POA: Diagnosis not present

## 2021-11-19 DIAGNOSIS — R5381 Other malaise: Secondary | ICD-10-CM | POA: Diagnosis not present

## 2021-11-19 DIAGNOSIS — M1990 Unspecified osteoarthritis, unspecified site: Secondary | ICD-10-CM | POA: Diagnosis present

## 2021-11-19 DIAGNOSIS — E1122 Type 2 diabetes mellitus with diabetic chronic kidney disease: Secondary | ICD-10-CM | POA: Diagnosis present

## 2021-11-19 DIAGNOSIS — N186 End stage renal disease: Secondary | ICD-10-CM

## 2021-11-19 DIAGNOSIS — A419 Sepsis, unspecified organism: Secondary | ICD-10-CM | POA: Diagnosis not present

## 2021-11-19 DIAGNOSIS — Z87442 Personal history of urinary calculi: Secondary | ICD-10-CM

## 2021-11-19 DIAGNOSIS — I5020 Unspecified systolic (congestive) heart failure: Secondary | ICD-10-CM

## 2021-11-19 DIAGNOSIS — D631 Anemia in chronic kidney disease: Secondary | ICD-10-CM | POA: Diagnosis present

## 2021-11-19 DIAGNOSIS — E8779 Other fluid overload: Secondary | ICD-10-CM | POA: Diagnosis not present

## 2021-11-19 DIAGNOSIS — Z66 Do not resuscitate: Secondary | ICD-10-CM | POA: Diagnosis not present

## 2021-11-19 DIAGNOSIS — Z6834 Body mass index (BMI) 34.0-34.9, adult: Secondary | ICD-10-CM | POA: Diagnosis not present

## 2021-11-19 DIAGNOSIS — Z7189 Other specified counseling: Secondary | ICD-10-CM | POA: Diagnosis not present

## 2021-11-19 DIAGNOSIS — I959 Hypotension, unspecified: Secondary | ICD-10-CM

## 2021-11-19 DIAGNOSIS — J449 Chronic obstructive pulmonary disease, unspecified: Secondary | ICD-10-CM | POA: Diagnosis not present

## 2021-11-19 DIAGNOSIS — E1159 Type 2 diabetes mellitus with other circulatory complications: Secondary | ICD-10-CM | POA: Diagnosis not present

## 2021-11-19 DIAGNOSIS — F17210 Nicotine dependence, cigarettes, uncomplicated: Secondary | ICD-10-CM | POA: Diagnosis present

## 2021-11-19 DIAGNOSIS — E861 Hypovolemia: Secondary | ICD-10-CM | POA: Diagnosis not present

## 2021-11-19 DIAGNOSIS — I5023 Acute on chronic systolic (congestive) heart failure: Secondary | ICD-10-CM | POA: Diagnosis present

## 2021-11-19 DIAGNOSIS — Z885 Allergy status to narcotic agent status: Secondary | ICD-10-CM

## 2021-11-19 DIAGNOSIS — Z79899 Other long term (current) drug therapy: Secondary | ICD-10-CM

## 2021-11-19 DIAGNOSIS — E119 Type 2 diabetes mellitus without complications: Secondary | ICD-10-CM

## 2021-11-19 DIAGNOSIS — E1151 Type 2 diabetes mellitus with diabetic peripheral angiopathy without gangrene: Secondary | ICD-10-CM | POA: Diagnosis present

## 2021-11-19 DIAGNOSIS — Z794 Long term (current) use of insulin: Secondary | ICD-10-CM

## 2021-11-19 DIAGNOSIS — N2581 Secondary hyperparathyroidism of renal origin: Secondary | ICD-10-CM | POA: Diagnosis present

## 2021-11-19 DIAGNOSIS — S0990XA Unspecified injury of head, initial encounter: Secondary | ICD-10-CM

## 2021-11-19 DIAGNOSIS — I080 Rheumatic disorders of both mitral and aortic valves: Secondary | ICD-10-CM | POA: Diagnosis present

## 2021-11-19 DIAGNOSIS — Z515 Encounter for palliative care: Secondary | ICD-10-CM | POA: Diagnosis not present

## 2021-11-19 DIAGNOSIS — R9389 Abnormal findings on diagnostic imaging of other specified body structures: Secondary | ICD-10-CM

## 2021-11-19 DIAGNOSIS — R55 Syncope and collapse: Secondary | ICD-10-CM | POA: Diagnosis present

## 2021-11-19 DIAGNOSIS — I132 Hypertensive heart and chronic kidney disease with heart failure and with stage 5 chronic kidney disease, or end stage renal disease: Secondary | ICD-10-CM | POA: Diagnosis present

## 2021-11-19 DIAGNOSIS — F419 Anxiety disorder, unspecified: Secondary | ICD-10-CM | POA: Diagnosis present

## 2021-11-19 DIAGNOSIS — M50223 Other cervical disc displacement at C6-C7 level: Secondary | ICD-10-CM | POA: Diagnosis not present

## 2021-11-19 DIAGNOSIS — Z8701 Personal history of pneumonia (recurrent): Secondary | ICD-10-CM

## 2021-11-19 DIAGNOSIS — F4310 Post-traumatic stress disorder, unspecified: Secondary | ICD-10-CM | POA: Diagnosis present

## 2021-11-19 DIAGNOSIS — I499 Cardiac arrhythmia, unspecified: Secondary | ICD-10-CM | POA: Diagnosis not present

## 2021-11-19 DIAGNOSIS — I2510 Atherosclerotic heart disease of native coronary artery without angina pectoris: Secondary | ICD-10-CM | POA: Diagnosis present

## 2021-11-19 DIAGNOSIS — R0902 Hypoxemia: Secondary | ICD-10-CM | POA: Diagnosis not present

## 2021-11-19 DIAGNOSIS — R918 Other nonspecific abnormal finding of lung field: Secondary | ICD-10-CM | POA: Diagnosis not present

## 2021-11-19 DIAGNOSIS — S81802A Unspecified open wound, left lower leg, initial encounter: Secondary | ICD-10-CM

## 2021-11-19 DIAGNOSIS — E669 Obesity, unspecified: Secondary | ICD-10-CM | POA: Diagnosis present

## 2021-11-19 DIAGNOSIS — I9589 Other hypotension: Secondary | ICD-10-CM | POA: Diagnosis not present

## 2021-11-19 DIAGNOSIS — F32A Depression, unspecified: Secondary | ICD-10-CM | POA: Diagnosis present

## 2021-11-19 DIAGNOSIS — Y92009 Unspecified place in unspecified non-institutional (private) residence as the place of occurrence of the external cause: Secondary | ICD-10-CM | POA: Diagnosis not present

## 2021-11-19 DIAGNOSIS — I452 Bifascicular block: Secondary | ICD-10-CM | POA: Diagnosis present

## 2021-11-19 DIAGNOSIS — Z992 Dependence on renal dialysis: Secondary | ICD-10-CM

## 2021-11-19 DIAGNOSIS — E11649 Type 2 diabetes mellitus with hypoglycemia without coma: Secondary | ICD-10-CM | POA: Diagnosis not present

## 2021-11-19 DIAGNOSIS — R6520 Severe sepsis without septic shock: Secondary | ICD-10-CM

## 2021-11-19 DIAGNOSIS — I472 Ventricular tachycardia, unspecified: Secondary | ICD-10-CM | POA: Diagnosis present

## 2021-11-19 DIAGNOSIS — Z8249 Family history of ischemic heart disease and other diseases of the circulatory system: Secondary | ICD-10-CM

## 2021-11-19 DIAGNOSIS — I6523 Occlusion and stenosis of bilateral carotid arteries: Secondary | ICD-10-CM | POA: Diagnosis not present

## 2021-11-19 DIAGNOSIS — E039 Hypothyroidism, unspecified: Secondary | ICD-10-CM | POA: Diagnosis not present

## 2021-11-19 DIAGNOSIS — Z7902 Long term (current) use of antithrombotics/antiplatelets: Secondary | ICD-10-CM

## 2021-11-19 DIAGNOSIS — E1142 Type 2 diabetes mellitus with diabetic polyneuropathy: Secondary | ICD-10-CM | POA: Diagnosis present

## 2021-11-19 DIAGNOSIS — I440 Atrioventricular block, first degree: Secondary | ICD-10-CM | POA: Diagnosis present

## 2021-11-19 DIAGNOSIS — S0001XA Abrasion of scalp, initial encounter: Secondary | ICD-10-CM | POA: Diagnosis present

## 2021-11-19 DIAGNOSIS — J929 Pleural plaque without asbestos: Secondary | ICD-10-CM | POA: Diagnosis not present

## 2021-11-19 DIAGNOSIS — Z8673 Personal history of transient ischemic attack (TIA), and cerebral infarction without residual deficits: Secondary | ICD-10-CM

## 2021-11-19 DIAGNOSIS — W050XXA Fall from non-moving wheelchair, initial encounter: Secondary | ICD-10-CM | POA: Diagnosis present

## 2021-11-19 DIAGNOSIS — Z89611 Acquired absence of right leg above knee: Secondary | ICD-10-CM

## 2021-11-19 DIAGNOSIS — R011 Cardiac murmur, unspecified: Secondary | ICD-10-CM | POA: Diagnosis present

## 2021-11-19 DIAGNOSIS — I350 Nonrheumatic aortic (valve) stenosis: Secondary | ICD-10-CM | POA: Diagnosis not present

## 2021-11-19 DIAGNOSIS — Z888 Allergy status to other drugs, medicaments and biological substances status: Secondary | ICD-10-CM

## 2021-11-19 DIAGNOSIS — J9811 Atelectasis: Secondary | ICD-10-CM | POA: Diagnosis present

## 2021-11-19 DIAGNOSIS — N189 Chronic kidney disease, unspecified: Secondary | ICD-10-CM | POA: Diagnosis not present

## 2021-11-19 DIAGNOSIS — T1490XA Injury, unspecified, initial encounter: Secondary | ICD-10-CM | POA: Diagnosis not present

## 2021-11-19 DIAGNOSIS — Z9181 History of falling: Secondary | ICD-10-CM

## 2021-11-19 DIAGNOSIS — I4510 Unspecified right bundle-branch block: Secondary | ICD-10-CM | POA: Diagnosis not present

## 2021-11-19 DIAGNOSIS — Z79890 Hormone replacement therapy (postmenopausal): Secondary | ICD-10-CM

## 2021-11-19 DIAGNOSIS — I739 Peripheral vascular disease, unspecified: Secondary | ICD-10-CM | POA: Diagnosis present

## 2021-11-19 DIAGNOSIS — S81012A Laceration without foreign body, left knee, initial encounter: Secondary | ICD-10-CM | POA: Diagnosis present

## 2021-11-19 DIAGNOSIS — I429 Cardiomyopathy, unspecified: Secondary | ICD-10-CM | POA: Diagnosis present

## 2021-11-19 DIAGNOSIS — I120 Hypertensive chronic kidney disease with stage 5 chronic kidney disease or end stage renal disease: Secondary | ICD-10-CM | POA: Diagnosis not present

## 2021-11-19 DIAGNOSIS — Z043 Encounter for examination and observation following other accident: Secondary | ICD-10-CM | POA: Diagnosis not present

## 2021-11-19 DIAGNOSIS — R4020 Unspecified coma: Secondary | ICD-10-CM | POA: Diagnosis not present

## 2021-11-19 LAB — COMPREHENSIVE METABOLIC PANEL
ALT: 47 U/L — ABNORMAL HIGH (ref 0–44)
AST: 74 U/L — ABNORMAL HIGH (ref 15–41)
Albumin: 2.7 g/dL — ABNORMAL LOW (ref 3.5–5.0)
Alkaline Phosphatase: 188 U/L — ABNORMAL HIGH (ref 38–126)
Anion gap: 14 (ref 5–15)
BUN: 38 mg/dL — ABNORMAL HIGH (ref 8–23)
CO2: 23 mmol/L (ref 22–32)
Calcium: 8.9 mg/dL (ref 8.9–10.3)
Chloride: 102 mmol/L (ref 98–111)
Creatinine, Ser: 4.39 mg/dL — ABNORMAL HIGH (ref 0.61–1.24)
GFR, Estimated: 14 mL/min — ABNORMAL LOW (ref >60)
Glucose, Bld: 135 mg/dL — ABNORMAL HIGH (ref 70–99)
Potassium: 4.3 mmol/L (ref 3.5–5.1)
Sodium: 139 mmol/L (ref 135–145)
Total Bilirubin: 0.7 mg/dL (ref 0.3–1.2)
Total Protein: 5.8 g/dL — ABNORMAL LOW (ref 6.5–8.1)

## 2021-11-19 LAB — CBC WITH DIFFERENTIAL/PLATELET
Abs Immature Granulocytes: 0.02 10*3/uL (ref 0.00–0.07)
Basophils Absolute: 0 10*3/uL (ref 0.0–0.1)
Basophils Relative: 1 %
Eosinophils Absolute: 0.1 10*3/uL (ref 0.0–0.5)
Eosinophils Relative: 2 %
HCT: 27.4 % — ABNORMAL LOW (ref 39.0–52.0)
Hemoglobin: 8.7 g/dL — ABNORMAL LOW (ref 13.0–17.0)
Immature Granulocytes: 0 %
Lymphocytes Relative: 15 %
Lymphs Abs: 0.9 10*3/uL (ref 0.7–4.0)
MCH: 32.2 pg (ref 26.0–34.0)
MCHC: 31.8 g/dL (ref 30.0–36.0)
MCV: 101.5 fL — ABNORMAL HIGH (ref 80.0–100.0)
Monocytes Absolute: 0.7 10*3/uL (ref 0.1–1.0)
Monocytes Relative: 13 %
Neutro Abs: 3.9 10*3/uL (ref 1.7–7.7)
Neutrophils Relative %: 69 %
Platelets: 155 10*3/uL (ref 150–400)
RBC: 2.7 MIL/uL — ABNORMAL LOW (ref 4.22–5.81)
RDW: 18.5 % — ABNORMAL HIGH (ref 11.5–15.5)
WBC: 5.6 10*3/uL (ref 4.0–10.5)
nRBC: 0 % (ref 0.0–0.2)

## 2021-11-19 LAB — PROTIME-INR
INR: 1.4 — ABNORMAL HIGH (ref 0.8–1.2)
Prothrombin Time: 16.9 seconds — ABNORMAL HIGH (ref 11.4–15.2)

## 2021-11-19 LAB — LACTIC ACID, PLASMA
Lactic Acid, Venous: 2.4 mmol/L (ref 0.5–1.9)
Lactic Acid, Venous: 1.4 mmol/L (ref 0.5–1.9)

## 2021-11-19 LAB — CBG MONITORING, ED: Glucose-Capillary: 141 mg/dL — ABNORMAL HIGH (ref 70–99)

## 2021-11-19 LAB — GLUCOSE, CAPILLARY: Glucose-Capillary: 107 mg/dL — ABNORMAL HIGH (ref 70–99)

## 2021-11-19 LAB — APTT: aPTT: 38 seconds — ABNORMAL HIGH (ref 24–36)

## 2021-11-19 LAB — PROCALCITONIN: Procalcitonin: 1.36 ng/mL

## 2021-11-19 MED ORDER — DIPHENHYDRAMINE HCL 25 MG PO CAPS: 25.0000 mg | ORAL_CAPSULE | Freq: Every day | ORAL | Status: DC | PRN

## 2021-11-19 MED ORDER — INSULIN ASPART 100 UNIT/ML IJ SOLN: 0.0000 [IU] | Freq: Every day | INTRAMUSCULAR | Status: DC

## 2021-11-19 MED ORDER — LEVOTHYROXINE SODIUM 75 MCG PO TABS
150.0000 ug | ORAL_TABLET | Freq: Every day | ORAL | Status: DC
  Administered 2021-11-20 – 2021-11-24 (×5): 150 ug via ORAL
  Filled 2021-11-19 (×5): qty 2

## 2021-11-19 MED ORDER — PANTOPRAZOLE SODIUM 40 MG PO TBEC
40.0000 mg | DELAYED_RELEASE_TABLET | Freq: Every day | ORAL | Status: DC | PRN
  Administered 2021-11-20: 40 mg via ORAL
  Filled 2021-11-19: qty 1

## 2021-11-19 MED ORDER — SODIUM CHLORIDE 0.9 % IV BOLUS
500.0000 mL | Freq: Once | INTRAVENOUS | Status: AC
  Administered 2021-11-19: 500 mL via INTRAVENOUS

## 2021-11-19 MED ORDER — SODIUM CHLORIDE 0.9 % IV BOLUS (SEPSIS)
1000.0000 mL | Freq: Once | INTRAVENOUS | Status: AC
Start: 2021-11-19 — End: 2021-11-19
  Administered 2021-11-19: 1000 mL via INTRAVENOUS

## 2021-11-19 MED ORDER — SODIUM CHLORIDE 0.9 % IV SOLN
500.0000 mg | INTRAVENOUS | Status: DC
  Administered 2021-11-20: 500 mg via INTRAVENOUS
  Filled 2021-11-19: qty 5

## 2021-11-19 MED ORDER — ONDANSETRON HCL 4 MG PO TABS: 4.0000 mg | ORAL_TABLET | Freq: Four times a day (QID) | ORAL | Status: DC | PRN

## 2021-11-19 MED ORDER — CALCIUM ACETATE (PHOS BINDER) 667 MG PO CAPS
1334.0000 mg | ORAL_CAPSULE | Freq: Three times a day (TID) | ORAL | Status: DC
  Administered 2021-11-20 – 2021-11-24 (×10): 1334 mg via ORAL
  Filled 2021-11-19 (×10): qty 2

## 2021-11-19 MED ORDER — ALPRAZOLAM 0.5 MG PO TABS
0.5000 mg | ORAL_TABLET | Freq: Two times a day (BID) | ORAL | Status: DC | PRN
  Administered 2021-11-20 – 2021-11-23 (×7): 0.5 mg via ORAL
  Filled 2021-11-19 (×7): qty 1

## 2021-11-19 MED ORDER — RENA-VITE PO TABS
1.0000 | ORAL_TABLET | Freq: Every day | ORAL | Status: DC
  Administered 2021-11-20 – 2021-11-24 (×5): 1 via ORAL
  Filled 2021-11-19 (×5): qty 1

## 2021-11-19 MED ORDER — ACETAMINOPHEN 325 MG PO TABS
650.0000 mg | ORAL_TABLET | Freq: Four times a day (QID) | ORAL | Status: DC | PRN
Start: 2021-11-19 — End: 2021-11-24

## 2021-11-19 MED ORDER — ACETAMINOPHEN 650 MG RE SUPP: 650.0000 mg | Freq: Four times a day (QID) | RECTAL | Status: DC | PRN

## 2021-11-19 MED ORDER — LEVOFLOXACIN IN D5W 750 MG/150ML IV SOLN
750.0000 mg | Freq: Once | INTRAVENOUS | Status: AC
  Administered 2021-11-19: 750 mg via INTRAVENOUS
  Filled 2021-11-19: qty 150

## 2021-11-19 MED ORDER — OXYCODONE HCL 5 MG PO TABS
5.0000 mg | ORAL_TABLET | ORAL | Status: DC | PRN
  Administered 2021-11-19 – 2021-11-24 (×20): 5 mg via ORAL
  Filled 2021-11-19 (×20): qty 1

## 2021-11-19 MED ORDER — HEPARIN SODIUM (PORCINE) 5000 UNIT/ML IJ SOLN
5000.0000 [IU] | Freq: Three times a day (TID) | INTRAMUSCULAR | Status: DC
  Administered 2021-11-20 – 2021-11-24 (×12): 5000 [IU] via SUBCUTANEOUS
  Filled 2021-11-19 (×12): qty 1

## 2021-11-19 MED ORDER — CALCIUM ACETATE (PHOS BINDER) 667 MG PO CAPS
667.0000 mg | ORAL_CAPSULE | ORAL | Status: DC | PRN
  Filled 2021-11-19: qty 1

## 2021-11-19 MED ORDER — MIDODRINE HCL 5 MG PO TABS
10.0000 mg | ORAL_TABLET | Freq: Three times a day (TID) | ORAL | Status: DC
  Administered 2021-11-19 – 2021-11-24 (×12): 10 mg via ORAL
  Filled 2021-11-19 (×14): qty 2

## 2021-11-19 MED ORDER — INSULIN ASPART 100 UNIT/ML IJ SOLN
0.0000 [IU] | Freq: Three times a day (TID) | INTRAMUSCULAR | Status: DC
  Administered 2021-11-20: 5 [IU] via SUBCUTANEOUS
  Administered 2021-11-20: 2 [IU] via SUBCUTANEOUS

## 2021-11-19 MED ORDER — PREGABALIN 75 MG PO CAPS
150.0000 mg | ORAL_CAPSULE | Freq: Three times a day (TID) | ORAL | Status: DC
  Administered 2021-11-20 – 2021-11-24 (×15): 150 mg via ORAL
  Filled 2021-11-19 (×15): qty 2

## 2021-11-19 MED ORDER — SODIUM CHLORIDE 0.9 % IV SOLN
2.0000 g | INTRAVENOUS | Status: DC
  Administered 2021-11-20 – 2021-11-21 (×2): 2 g via INTRAVENOUS
  Filled 2021-11-19 (×2): qty 20

## 2021-11-19 MED ORDER — SODIUM CHLORIDE 0.9% FLUSH
3.0000 mL | Freq: Two times a day (BID) | INTRAVENOUS | Status: DC
  Administered 2021-11-20 – 2021-11-21 (×2): 3 mL via INTRAVENOUS

## 2021-11-19 MED ORDER — ALBUTEROL SULFATE (2.5 MG/3ML) 0.083% IN NEBU: 3.0000 mL | INHALATION_SOLUTION | Freq: Four times a day (QID) | RESPIRATORY_TRACT | Status: DC | PRN

## 2021-11-19 MED ORDER — ATORVASTATIN CALCIUM 80 MG PO TABS
80.0000 mg | ORAL_TABLET | Freq: Every day | ORAL | Status: DC
  Administered 2021-11-20 – 2021-11-24 (×5): 80 mg via ORAL
  Filled 2021-11-19: qty 2
  Filled 2021-11-19 (×4): qty 1

## 2021-11-19 MED ORDER — ONDANSETRON HCL 4 MG/2ML IJ SOLN
4.0000 mg | Freq: Four times a day (QID) | INTRAMUSCULAR | Status: DC | PRN
Start: 2021-11-19 — End: 2021-11-24

## 2021-11-19 MED ORDER — LEVOFLOXACIN IN D5W 750 MG/150ML IV SOLN: 750.0000 mg | INTRAVENOUS | Status: DC

## 2021-11-19 MED ORDER — CLOPIDOGREL BISULFATE 75 MG PO TABS
75.0000 mg | ORAL_TABLET | Freq: Every day | ORAL | Status: DC
  Administered 2021-11-20 – 2021-11-24 (×5): 75 mg via ORAL
  Filled 2021-11-19 (×6): qty 1

## 2021-11-19 MED ORDER — INSULIN DETEMIR 100 UNIT/ML ~~LOC~~ SOLN
5.0000 [IU] | Freq: Every day | SUBCUTANEOUS | Status: DC
  Filled 2021-11-19 (×2): qty 0.05

## 2021-11-19 MED ORDER — SENNA 8.6 MG PO TABS: 1.0000 | ORAL_TABLET | ORAL | Status: DC | PRN

## 2021-11-19 NOTE — Assessment & Plan Note (Signed)
Continue Synthroid °

## 2021-11-19 NOTE — ED Provider Notes (Signed)
?[facility] ?Provider Note ? ? ?CSN: [id number] ?Arrival date & time: 11/19/21  1253 ? ?  ? ?History ? ?Chief Complaint  ?Patient presents with  ? Loss of Consciousness  ? ? ?[name] is a 71 y.o. male. ? ?HPI ?Patient presents [place] for evaluation of syncope, x2.  Both times today he fell out of his wheelchair.  After the first episode he was evaluated by EMS but not transported.  After the second episode he was transported and found to be hypotensive during transport.  He injured his head when he fell, the second time.  His wife was attempting to prevent him from falling out of the wheelchair when it happened a second time. ?  ? ?Home Medications ?Prior to Admission medications   ?Medication Sig Start Date End Date Taking? Authorizing Provider  ?albuterol (VENTOLIN HFA) 108 (90 Base) MCG/ACT inhaler Inhale 2 puffs into the lungs every 6 (six) hours as needed for wheezing or shortness of breath.     [provider]  ?ALPRAZolam Duanne Moron) 0.5 MG tablet Take 0.5 mg by mouth 2 (two) times daily as needed for anxiety. 05/24/20   [provider]  ?ARTIFICIAL TEAR OP Place 1 drop into both eyes every 6 (six) hours as needed (dry eyes).    [provider]  ?atorvastatin (LIPITOR) 80 MG tablet Take 1 tablet (80 mg total) by mouth at bedtime. ?Patient taking differently: Take 80 mg by mouth daily. 12/18/18   Angiulli, Lavon Paganini, PA-C  ?bismuth subsalicylate (PEPTO BISMOL) 262 MG/15ML suspension Take 30 mLs by mouth every 6 (six) hours as needed for indigestion.    [provider]  ?calcium acetate (PHOSLO) 667 MG capsule Take 4 capsules (2,668 mg total) by mouth 3 (three) times daily with meals. ?Patient taking differently: Take 667-1,334 mg by mouth See admin instructions. Take 1334 mg with meals and 667 mg with each snack 12/19/18   Angiulli, Lavon Paganini, PA-C  ?cinacalcet (SENSIPAR) 30 MG tablet Take 1 tablet (30 mg total) by mouth every evening. ?Patient not taking:  Reported on 10/30/2021 12/19/18   [name], PA-C  ?clopidogrel (PLAVIX) 75 MG tablet Take 1 tablet (75 mg total) by mouth daily. 12/18/18   Angiulli, Lavon Paganini, PA-C  ?cyclobenzaprine (FLEXERIL) 10 MG tablet Take 10 mg by mouth 3 (three) times daily as needed for muscle spasms. 05/18/20   [provider]  ?dicyclomine (BENTYL) 10 MG capsule Take 1 capsule (10 mg total) by mouth every 12 (twelve) hours as needed for spasms. 10/30/21   Harvel Quale, MD  ?diphenhydrAMINE (BENADRYL) 25 MG tablet Take 25 mg by mouth daily as needed for itching.    [provider]  ?GLOBAL EASE INJECT PEN NEEDLES 32G X 4 MM MISC TO BE USED DAILY WITH INSULIN 04/14/20   [provider]  ?ibuprofen (ADVIL) 200 MG tablet Take 400 mg by mouth in the morning and at bedtime.    [provider]  ?levothyroxine (SYNTHROID) 125 MCG tablet Take 1 tablet (125 mcg total) by mouth daily before breakfast. 12/18/18   Angiulli, Lavon Paganini, PA-C  ?lisinopril (ZESTRIL) 20 MG tablet Take 1 tablet by mouth daily. 11/07/20   [provider]  ?multivitamin (RENA-VIT) TABS tablet Take 1 tablet by mouth daily. 10/11/19   [provider]  ?NONFORMULARY OR COMPOUNDED ITEM Ketamine 10%, Baclofen 2%, Cyclobenzaprine 2%, Ketoprofen 10%, Gabapentin 6%, Bupivacaine 1%, Amitryptiline 5%, clonidine 0.2% ? ?Dispense 240 g ? ?Apply 1-2 g to  affected area 3-4 times per day ?Patient not taking: Reported on 10/30/2021 11/10/20   Jamse Arn, MD  ?ondansetron (ZOFRAN-ODT) 4 MG disintegrating tablet 4 mg as needed. 10/22/20   [provider]  ?Oxycodone HCl 10 MG TABS Take 10 mg by mouth 3 (three) times daily. 07/12/21   [provider]  ?polyethylene glycol (MIRALAX / GLYCOLAX) 17 g packet Take 17 g by mouth daily. ?Patient not taking: Reported on 10/30/2021    [provider]  ?pregabalin (LYRICA) 150 MG capsule Take 1 capsule (150 mg total) by mouth 3 (three) times daily. 12/18/18    Angiulli, Lavon Paganini, PA-C  ?prochlorperazine (COMPAZINE) 25 MG suppository Place 1 suppository (25 mg total) rectally every 12 (twelve) hours as needed for nausea. 10/25/20   Rhyne, Hulen Shouts, PA-C  ?senna (SENOKOT) 8.6 MG tablet Take 1 tablet by mouth as needed for constipation.    [provider]  ?sevelamer carbonate (RENVELA) 800 MG tablet Take 800-1,600 mg by mouth See admin instructions. Take 1600 mg with each meal and 800 mg with each snack ?Patient not taking: Reported on 10/30/2021 10/11/19   [provider]  ?TOUJEO SOLOSTAR 300 UNIT/ML SOPN Inject 10-15 Units into the skin at bedtime as needed (if blood sugar is 130 or higher). 01/12/19   [provider]  ?   ? ?Allergies    ?Ace inhibitors, Clonidine, Morphine and related, and Oxytocin   ? ?Review of Systems   ?Review of Systems ? ?Physical Exam ?Updated Vital Signs ?BP 91/70   Pulse 94   Temp 98.4 ?F (36.9 ?C) (Oral)   Resp 15   Ht '6\' 4"'$  (1.93 m)   Wt 130 kg   SpO2 100%   BMI 34.89 kg/m?  ?Physical Exam ?Vitals and nursing note reviewed.  ?Constitutional:   ?   Appearance: He is well-developed. He is not ill-appearing.  ?HENT:  ?   Head: Normocephalic and atraumatic.  ?   Right Ear: External ear normal.  ?   Left Ear: External ear normal.  ?Eyes:  ?   Conjunctiva/sclera: Conjunctivae normal.  ?   Pupils: Pupils are equal, round, and reactive to light.  ?Neck:  ?   Trachea: Phonation normal.  ?Cardiovascular:  ?   Rate and Rhythm: Normal rate and regular rhythm.  ?   Heart sounds: Normal heart sounds.  ?Pulmonary:  ?   Effort: Pulmonary effort is normal.  ?   Breath sounds: Normal breath sounds.  ?Abdominal:  ?   Palpations: Abdomen is soft.  ?   Tenderness: There is no abdominal tenderness.  ?Musculoskeletal:     ?   General: Normal range of motion.  ?   Cervical back: Normal range of motion and neck supple.  ?   Comments: Right leg AKA, stump appears normal.  Left lower leg, has a bandage on wounds of the posterior calf.   These wounds are superficial, consisting of blistering and epithelial sloughing.  No significant drainage, erythema or bleeding associated with these wounds.  A representative image is in the chart.  ?Skin: ?   General: Skin is warm and dry.  ?Neurological:  ?   Mental Status: He is alert and oriented to person, place, and time.  ?   Cranial Nerves: No cranial nerve deficit.  ?   Sensory: No sensory deficit.  ?   Motor: No abnormal muscle tone.  ?   Coordination: Coordination normal.  ?Psychiatric:     ?   Behavior:  Behavior normal.     ?   Thought Content: Thought content normal.     ?   Judgment: Judgment normal.  ? ? ? ? ? ?ED Results / Procedures / Treatments   ?Labs ?(all labs ordered are listed, but only abnormal results are displayed) ?Labs Reviewed  ?LACTIC ACID, PLASMA - Abnormal; Notable for the following components:  ?    Result Value  ? Lactic Acid, Venous 2.4 (*)   ? All other components within normal limits  ?COMPREHENSIVE METABOLIC PANEL - Abnormal; Notable for the following components:  ? Glucose, Bld 135 (*)   ? BUN 38 (*)   ? Creatinine, Ser 4.39 (*)   ? Total Protein 5.8 (*)   ? Albumin 2.7 (*)   ? AST 74 (*)   ? ALT 47 (*)   ? Alkaline Phosphatase 188 (*)   ? GFR, Estimated 14 (*)   ? All other components within normal limits  ?CBC WITH DIFFERENTIAL/PLATELET - Abnormal; Notable for the following components:  ? RBC 2.70 (*)   ? Hemoglobin 8.7 (*)   ? HCT 27.4 (*)   ? MCV 101.5 (*)   ? RDW 18.5 (*)   ? All other components within normal limits  ?PROTIME-INR - Abnormal; Notable for the following components:  ? Prothrombin Time 16.9 (*)   ? INR 1.4 (*)   ? All other components within normal limits  ?APTT - Abnormal; Notable for the following components:  ? aPTT 38 (*)   ? All other components within normal limits  ?CBG MONITORING, ED - Abnormal; Notable for the following components:  ? Glucose-Capillary 141 (*)   ? All other components within normal limits  ?CULTURE, BLOOD (ROUTINE X 2)  ?CULTURE,  BLOOD (ROUTINE X 2)  ?URINE CULTURE  ?LACTIC ACID, PLASMA  ?URINALYSIS, ROUTINE W REFLEX MICROSCOPIC  ? ? ?EKG ?EKG Interpretation ? ?Date/Time:  Sunday November 19 2021 13:10:04 EDT ?Ventricular Rate:  95 ?PR Interval:

## 2021-11-19 NOTE — ED Triage Notes (Signed)
"  Wife was helping get him in his pants on side of bed. Had a syncopal episode and fell over on to head. Enroute he was hypotensive in lower 80s" per EMS ?Upon arrival to hospital BP was 58/40. Patient was oriented but sluggish. Placed in trendelenburg. Patient became unresponsive for approximately 1 minute. Repeat BP 48/28. Provider called to bedside.  ?Last dialysis treatment was yesterday. Denies any recent sickness. Abrasion noted to top of head from fall at home.  ?

## 2021-11-19 NOTE — Assessment & Plan Note (Addendum)
Continue routine HD TTS thru [facility] (last 4/8). ?-HD: 4/11 ?-nephrology consult ?

## 2021-11-19 NOTE — Assessment & Plan Note (Addendum)
Suspected with subtle CXR findings, hypotension, tachypnea, elevated lactic acid. There is also a possibility that derangements are hemodynamically medicated primarily and not due to infection. ?- Continue empiric abx. Would hopefully target only 5 days of Tx  ? ?

## 2021-11-19 NOTE — ED Notes (Signed)
A&O x 4. No complaints. Spouse at bedside.  ?

## 2021-11-19 NOTE — Progress Notes (Deleted)
Pharmacy Antibiotic Note [name] a 71 y.o. male admitted on 11/19/2021 with pneumonia.  Pharmacy has been consulted for levofloxacin dosing. ? ?Plan: ?Levofloxacin '750mg'$  iv q48h ? ?Medical History: ?Past Medical History:  ?Diagnosis Date  ? Anemia   ? Anxiety   ? Arthritis   ? CAD (coronary artery disease)   ? Myoview August 2021 with evidence of large inferior scar but no active ischemia  ? COPD (chronic obstructive pulmonary disease) ([place])   ? Depression   ? ESRD on hemodialysis ([place])   ? Essential hypertension   ? GERD (gastroesophageal reflux disease)   ? H/O hiatal hernia   ? Headache(784.0)   ? History of kidney stones   ? History of pneumonia   ? Hypothyroidism   ? Neuropathy   ? Non-healing wound of amputation stump ([place])   ? Right  ? Peripheral vascular disease ([place])   ? PTSD (post-traumatic stress disorder)   ? Type 2 diabetes mellitus ([place])   ? ? ?Allergies:  ?Allergies  ?Allergen Reactions  ? Ace Inhibitors   ?  Unknown reaction, tolerates lisinopril   ? Clonidine   ?  Unknown reaction   ? Morphine And Related Other (See Comments)  ?  Not effective  ? Oxytocin   ?  unknown  ? ? ?Filed Weights  ? 11/19/21 1314  ?Weight: 130 kg (286 lb 9.6 oz)  ? ? ? ?  Latest Ref Rng & Units 11/19/2021  ?  2:01 PM 10/25/2020  ?  5:00 AM 10/24/2020  ?  9:29 AM  ?CBC  ?WBC 4.0 - 10.5 K/uL 5.6   5.6     ?Hemoglobin 13.0 - 17.0 g/dL 8.7   8.1   9.5    ?Hematocrit 39.0 - 52.0 % 27.4   24.9   28.0    ?Platelets 150 - 400 K/uL 155   127     ? ? ? ?Estimated Creatinine Clearance: 22.7 mL/min (A) (by C-G formula based on SCr of 4.39 mg/dL (H)). ? ?Antibiotics Given (last 72 hours)   ? ? Date/Time Action Medication Dose Rate  ? 11/19/21 1650 New Bag/Given  ? levofloxacin (LEVAQUIN) IVPB 750 mg 750 mg 100 mL/hr  ? ?  ? ? ?Antimicrobials this admission: ? ?levofloxacin 11/19/2021  >>  ? ?Microbiology results: ?11/19/2021  BCx: sent ?11/19/2021  UCx: sent ?11/19/2021  Resp Panel: sent  ?11/19/2021  MRSA PCR: sent ? ?Thank you for allowing  pharmacy to be a part of this patient?s care. ? ?Thomasenia Sales, PharmD ?Clinical Pharmacist ? ? ?

## 2021-11-19 NOTE — Assessment & Plan Note (Addendum)
-   Toujeo 10-15 units at home, given 5u here, hold for now with NPO status tomorrow.  ?- Continue SSI but decrease dose due to hypoglycemia ?

## 2021-11-19 NOTE — ED Notes (Signed)
Lactic acid 2.4 ?Dr Eulis Foster and primary nurse notified ?

## 2021-11-19 NOTE — ED Notes (Signed)
A&O x 4. No complaints of pain. Spouse at bedside.  ?

## 2021-11-19 NOTE — ED Notes (Signed)
Pt awake, alert and talking at this time.  ?

## 2021-11-19 NOTE — Assessment & Plan Note (Addendum)
Cardiogenic sounding story and echo confirms causative valvular disease, depressed LVEF. Carotid U/S without severe stenosis.  ?- See plan for aortic stenosis.  ?- Fall precautions.  ?- Continue cardiac monitoring. ?

## 2021-11-19 NOTE — Assessment & Plan Note (Signed)
Continue Protonix °

## 2021-11-19 NOTE — Assessment & Plan Note (Addendum)
Improved. Suspect dialysis-related volume removal with decreased cardiac output ?- Hold home lisinopril, lasix, continue midodrine. ?

## 2021-11-19 NOTE — H&P (Signed)
?History and Physical  ? ? ?Patient: [name] PJA:[id number] DOB: February 05, 1951 ?DOA: 11/19/2021 ?DOS: the patient was seen and examined on 11/19/2021 ?PCP: Caryl Bis, MD  ?Patient coming from: Home ? ?Chief Complaint:  ?Chief Complaint  ?Patient presents with  ? Loss of Consciousness  ? ?HPI: [name] is a 71 y.o. male with medical history significant of with history of coronary artery disease, COPD, ESRD on HD, GERD, hypertension, hypothyroidism, peripheral vascular disease, history of nonhealing wound, type 2 diabetes mellitus, and more presents the ED with a chief complaint of syncope.  Patient reports that he had 2 syncopal episodes approximately 10 minutes apart.  The first 1 he was sitting in his chair when he leaned over to check on the arm of the chair, and then was on the floor.  He had no preceding symptoms.  The syncopal event caught him by surprise.  He called EMS and asked them to help him get up back into his chair.  When they got him in his chair something was folded over and needed adjusting.  He does not remember willing back to his bedroom but his plan had been to go back to the bedroom get transfer himself to the bed 6 to chair and transfer himself back into the chair.  He woke up on the floor by the bed.  Again the syncopal episode caught him by surprise.  He did hit his head and also his left knee.  He does not have any left knee pain.  He has an abrasion on his head and skin tear on left knee.  He has not had any change in vision, hearing, or asymmetrical weakness.  EMS had to come get him up from the ground again.  So he decided to be transported to the hospital.  Patient denies any shortness of breath.  He reports that he does have a wheeze and cough at baseline due to smoking.  Neither have been worse than normal.  His cough is productive of white sputum which is normal for him.  He has not had any fevers, chest pain, palpitations.  He reports for quite some time bending over in  his chair would make him dizzy.  He is still taking a blood pressure medication, and he does take midodrine on hemodialysis days.  His hemodialysis days are Tuesday Thursday Saturday.  He was last dialyzed April 8.  He has no further complaints at this time. ? ?Patient smokes half a pack per day, declines nicotine patch at this time.  He reports he is quit cold [place] in the past, but this time he tried to wean himself off.  He does not drink alcohol.  He does use marijuana and his last use was the morning of the ninth.  He is vaccinated for COVID.  Patient is full code. ?Review of Systems: As mentioned in the history of present illness. All other systems reviewed and are negative. ?Past Medical History:  ?Diagnosis Date  ? Anemia   ? Anxiety   ? Arthritis   ? CAD (coronary artery disease)   ? Myoview August 2021 with evidence of large inferior scar but no active ischemia  ? COPD (chronic obstructive pulmonary disease) ([place])   ? Depression   ? ESRD on hemodialysis ([place])   ? Essential hypertension   ? GERD (gastroesophageal reflux disease)   ? H/O hiatal hernia   ? Headache(784.0)   ? History of kidney stones   ? History of pneumonia   ?  Hypothyroidism   ? Neuropathy   ? Non-healing wound of amputation stump ([place])   ? Right  ? Peripheral vascular disease ([place])   ? PTSD (post-traumatic stress disorder)   ? Type 2 diabetes mellitus ([place])   ? ?Past Surgical History:  ?Procedure Laterality Date  ? A/V FISTULAGRAM Left 01/16/2019  ? Procedure: A/V FISTULAGRAM;  Surgeon: [name], MD;  Location: [place] [facility];  Service: Cardiovascular;  Laterality: Left;  ? ABDOMINAL AORTOGRAM W/LOWER EXTREMITY Bilateral 03/18/2020  ? Procedure: ABDOMINAL AORTOGRAM W/LOWER EXTREMITY;  Surgeon: [name], MD;  Location: [place] [facility];  Service: Cardiovascular;  Laterality: Bilateral;  ? AMPUTATION Right 12/01/2018  ? Procedure: RIGHT AMPUTATION BELOW KNEE;  Surgeon: [name], MD;  Location: [facility] OR;  Service:  Vascular;  Laterality: Right;  ? AMPUTATION Right 04/27/2019  ? Procedure: AMPUTATION BELOW KNEE REVISION;  Surgeon: [name], MD;  Location: [facility] OR;  Service: Vascular;  Laterality: Right;  ? AMPUTATION Right 04/30/2019  ? Procedure: AMPUTATION ABOVE KNEE - RIGHT;  Surgeon: Waynetta Sandy, MD;  Location: [place];  Service: Vascular;  Laterality: Right;  ? APPLICATION OF WOUND VAC Right 04/27/2019  ? Procedure: APPLICATION OF WOUND VAC;  Surgeon: [name], MD;  Location: [place];  Service: Vascular;  Laterality: Right;  ? AV FISTULA PLACEMENT  2012  ?    left arm   ? AV FISTULA PLACEMENT Left 12/18/2012  ? Procedure: ARTERIOVENOUS (AV) FISTULA CREATION;  Surgeon: Angelia Mould, MD;  Location: [place];  Service: Vascular;  Laterality: Left;  ? COLONOSCOPY  10/26/2011  ? Procedure: COLONOSCOPY;  Surgeon: Rogene Houston, MD;  [facility];  Service: Endoscopy;  Laterality: N/A;  730  ? EMBOLECTOMY Left 12/09/2012  ? Procedure: EMBOLECTOMY;  Surgeon: Serafina Mitchell, MD;  Location: [facility] CATH LAB;  Service: Cardiovascular;  Laterality: Left;  left arm venous embolization  ? ENDARTERECTOMY Left 10/24/2020  ? Procedure: LEFT CAROTID ENDARTERECTOMY;  Surgeon: [name], MD;  Location: [facility] OR;  Service: Vascular;  Laterality: Left;  ? ENDARTERECTOMY FEMORAL Left 04/04/2020  ? Procedure: ENDARTERECTOMY COMMON FEMORAL;  Surgeon: [name], MD;  Location: [facility] OR;  Service: Vascular;  Laterality: Left;  ? ESOPHAGOGASTRODUODENOSCOPY (EGD) WITH ESOPHAGEAL DILATION N/A 04/23/2013  ? Procedure: ESOPHAGOGASTRODUODENOSCOPY (EGD) WITH ESOPHAGEAL DILATION;  Surgeon: Rogene Houston, MD;  [facility];  Service: Endoscopy;  Laterality: N/A;  200-moved to 930   ? FEMORAL-POPLITEAL BYPASS GRAFT Left 04/04/2020  ? Procedure: FEMORAL- BELOW KNEE POPLITEAL BYPASS GRAFT NON REVERSED VEIN;  Surgeon: [name], MD;  Location: [place];  Service: Vascular;  Laterality: Left;  ? FISTULA  SUPERFICIALIZATION Left 06/18/2013  ? Procedure: FISTULA SUPERFICIALIZATION & LIGATION BRANCH X 1;  Surgeon: Mal Misty, MD;  Location: [place];  Service: Vascular;  Laterality: Left;  ? GROIN DEBRIDEMENT Left 04/29/2020  ? Procedure: EXPLORATION LEFT GROIN WITH DEBRIDEMENT;  Surgeon: Rosetta Posner, MD;  Location: [place];  Service: Vascular;  Laterality: Left;  ? HEMATOMA EVACUATION Right 02/11/2017  ? Procedure: EVACUATION HEMATOMA RIGHT GROIN, Repair of Right Pseudo-anerysm.;  Surgeon: [name], MD;  Location: [facility];  Service: Vascular;  Laterality: Right;  ? INGUINAL HERNIA REPAIR    ? ,  times   2  ? INSERTION OF DIALYSIS CATHETER Left 12/18/2012  ? Procedure: INSERTION OF DIALYSIS CATHETER;  Surgeon: Angelia Mould, MD;  Location: [place];  Service: Vascular;  Laterality: Left;  ? KNEE ARTHROSCOPY  2011  ? Right Knee  ? LOWER EXTREMITY ANGIOGRAPHY N/A 02/11/2017  ? Procedure: Lower Extremity Angiography;  Surgeon: Lorretta Harp, MD;  Location: [place] [facility];  Service: Cardiovascular;  Laterality: N/A;  ? PERIPHERAL ATHRECTOMY  02/11/2017  ? PERIPHERAL VASCULAR ATHERECTOMY Left 02/11/2017  ? Procedure: Peripheral Vascular Atherectomy;  Surgeon: Lorretta Harp, MD;  Location: [place] [facility];  Service: Cardiovascular;  Laterality: Left;  ? PERIPHERAL VASCULAR BALLOON ANGIOPLASTY Left 01/16/2019  ? Procedure: PERIPHERAL VASCULAR BALLOON ANGIOPLASTY;  Surgeon: [name], MD;  Location: [place] [facility];  Service: Cardiovascular;  Laterality: Left;  central vein  ? REVISON OF ARTERIOVENOUS FISTULA Left 03/06/2016  ? Procedure: PLICATION OF LEFT BRACHIOCEPHALIC ARTERIOVENOUS FISTULA;  Surgeon: Conrad [place], MD;  Location: [place];  Service: Vascular;  Laterality: Left;  ? SHUNTOGRAM N/A 12/09/2012  ? Procedure: fistulogram;  Surgeon: Serafina Mitchell, MD;  Location: [facility] CATH LAB;  Service: Cardiovascular;  Laterality: N/A;  ? SHUNTOGRAM Left 06/03/2013  ? Procedure: Fistulogram;  Surgeon: Serafina Mitchell, MD;  Location: [facility] CATH LAB;  Service: Cardiovascular;  Laterality: Left;  ? THROMBECTOMY W/ EMBOLECTOMY Left 12/11/2012  ? Procedure: THROMBECTOMY ARTERIOVENOUS FISTULA;  Surgeon: Desmond Dike

## 2021-11-20 ENCOUNTER — Observation Stay (HOSPITAL_COMMUNITY): Payer: Medicare Other

## 2021-11-20 ENCOUNTER — Observation Stay (HOSPITAL_BASED_OUTPATIENT_CLINIC_OR_DEPARTMENT_OTHER): Payer: Medicare Other

## 2021-11-20 DIAGNOSIS — W050XXA Fall from non-moving wheelchair, initial encounter: Secondary | ICD-10-CM | POA: Diagnosis present

## 2021-11-20 DIAGNOSIS — I959 Hypotension, unspecified: Secondary | ICD-10-CM | POA: Diagnosis present

## 2021-11-20 DIAGNOSIS — I739 Peripheral vascular disease, unspecified: Secondary | ICD-10-CM

## 2021-11-20 DIAGNOSIS — Z992 Dependence on renal dialysis: Secondary | ICD-10-CM

## 2021-11-20 DIAGNOSIS — E11649 Type 2 diabetes mellitus with hypoglycemia without coma: Secondary | ICD-10-CM | POA: Diagnosis not present

## 2021-11-20 DIAGNOSIS — K219 Gastro-esophageal reflux disease without esophagitis: Secondary | ICD-10-CM | POA: Diagnosis not present

## 2021-11-20 DIAGNOSIS — Z794 Long term (current) use of insulin: Secondary | ICD-10-CM | POA: Diagnosis not present

## 2021-11-20 DIAGNOSIS — E1151 Type 2 diabetes mellitus with diabetic peripheral angiopathy without gangrene: Secondary | ICD-10-CM | POA: Diagnosis present

## 2021-11-20 DIAGNOSIS — N2581 Secondary hyperparathyroidism of renal origin: Secondary | ICD-10-CM | POA: Diagnosis present

## 2021-11-20 DIAGNOSIS — F17210 Nicotine dependence, cigarettes, uncomplicated: Secondary | ICD-10-CM | POA: Diagnosis present

## 2021-11-20 DIAGNOSIS — Z66 Do not resuscitate: Secondary | ICD-10-CM | POA: Diagnosis not present

## 2021-11-20 DIAGNOSIS — E861 Hypovolemia: Secondary | ICD-10-CM | POA: Diagnosis not present

## 2021-11-20 DIAGNOSIS — I5020 Unspecified systolic (congestive) heart failure: Secondary | ICD-10-CM | POA: Diagnosis not present

## 2021-11-20 DIAGNOSIS — I472 Ventricular tachycardia, unspecified: Secondary | ICD-10-CM | POA: Diagnosis present

## 2021-11-20 DIAGNOSIS — E119 Type 2 diabetes mellitus without complications: Secondary | ICD-10-CM

## 2021-11-20 DIAGNOSIS — E1122 Type 2 diabetes mellitus with diabetic chronic kidney disease: Secondary | ICD-10-CM | POA: Diagnosis present

## 2021-11-20 DIAGNOSIS — E669 Obesity, unspecified: Secondary | ICD-10-CM | POA: Diagnosis present

## 2021-11-20 DIAGNOSIS — R55 Syncope and collapse: Secondary | ICD-10-CM | POA: Diagnosis present

## 2021-11-20 DIAGNOSIS — J9811 Atelectasis: Secondary | ICD-10-CM | POA: Diagnosis present

## 2021-11-20 DIAGNOSIS — E1159 Type 2 diabetes mellitus with other circulatory complications: Secondary | ICD-10-CM | POA: Diagnosis not present

## 2021-11-20 DIAGNOSIS — J449 Chronic obstructive pulmonary disease, unspecified: Secondary | ICD-10-CM

## 2021-11-20 DIAGNOSIS — I350 Nonrheumatic aortic (valve) stenosis: Secondary | ICD-10-CM

## 2021-11-20 DIAGNOSIS — A419 Sepsis, unspecified organism: Secondary | ICD-10-CM | POA: Diagnosis not present

## 2021-11-20 DIAGNOSIS — N189 Chronic kidney disease, unspecified: Secondary | ICD-10-CM | POA: Diagnosis not present

## 2021-11-20 DIAGNOSIS — I6523 Occlusion and stenosis of bilateral carotid arteries: Secondary | ICD-10-CM | POA: Diagnosis not present

## 2021-11-20 DIAGNOSIS — D631 Anemia in chronic kidney disease: Secondary | ICD-10-CM | POA: Diagnosis present

## 2021-11-20 DIAGNOSIS — E8779 Other fluid overload: Secondary | ICD-10-CM | POA: Diagnosis not present

## 2021-11-20 DIAGNOSIS — Z515 Encounter for palliative care: Secondary | ICD-10-CM | POA: Diagnosis not present

## 2021-11-20 DIAGNOSIS — Z7189 Other specified counseling: Secondary | ICD-10-CM | POA: Diagnosis not present

## 2021-11-20 DIAGNOSIS — I452 Bifascicular block: Secondary | ICD-10-CM | POA: Diagnosis present

## 2021-11-20 DIAGNOSIS — F4310 Post-traumatic stress disorder, unspecified: Secondary | ICD-10-CM | POA: Diagnosis present

## 2021-11-20 DIAGNOSIS — I2510 Atherosclerotic heart disease of native coronary artery without angina pectoris: Secondary | ICD-10-CM | POA: Diagnosis not present

## 2021-11-20 DIAGNOSIS — I120 Hypertensive chronic kidney disease with stage 5 chronic kidney disease or end stage renal disease: Secondary | ICD-10-CM | POA: Diagnosis not present

## 2021-11-20 DIAGNOSIS — I429 Cardiomyopathy, unspecified: Secondary | ICD-10-CM | POA: Diagnosis present

## 2021-11-20 DIAGNOSIS — I9589 Other hypotension: Secondary | ICD-10-CM | POA: Diagnosis not present

## 2021-11-20 DIAGNOSIS — Z6834 Body mass index (BMI) 34.0-34.9, adult: Secondary | ICD-10-CM | POA: Diagnosis not present

## 2021-11-20 DIAGNOSIS — I080 Rheumatic disorders of both mitral and aortic valves: Secondary | ICD-10-CM | POA: Diagnosis present

## 2021-11-20 DIAGNOSIS — N186 End stage renal disease: Secondary | ICD-10-CM | POA: Diagnosis present

## 2021-11-20 DIAGNOSIS — Y92009 Unspecified place in unspecified non-institutional (private) residence as the place of occurrence of the external cause: Secondary | ICD-10-CM | POA: Diagnosis not present

## 2021-11-20 DIAGNOSIS — E039 Hypothyroidism, unspecified: Secondary | ICD-10-CM | POA: Diagnosis present

## 2021-11-20 DIAGNOSIS — F32A Depression, unspecified: Secondary | ICD-10-CM | POA: Diagnosis present

## 2021-11-20 DIAGNOSIS — I5023 Acute on chronic systolic (congestive) heart failure: Secondary | ICD-10-CM | POA: Diagnosis present

## 2021-11-20 DIAGNOSIS — I132 Hypertensive heart and chronic kidney disease with heart failure and with stage 5 chronic kidney disease, or end stage renal disease: Secondary | ICD-10-CM | POA: Diagnosis present

## 2021-11-20 LAB — CBC WITH DIFFERENTIAL/PLATELET
Abs Immature Granulocytes: 0.06 10*3/uL (ref 0.00–0.07)
Basophils Absolute: 0.1 10*3/uL (ref 0.0–0.1)
Basophils Relative: 1 %
Eosinophils Absolute: 0.1 10*3/uL (ref 0.0–0.5)
Eosinophils Relative: 2 %
HCT: 32.8 % — ABNORMAL LOW (ref 39.0–52.0)
Hemoglobin: 10.2 g/dL — ABNORMAL LOW (ref 13.0–17.0)
Immature Granulocytes: 1 %
Lymphocytes Relative: 24 %
Lymphs Abs: 1.5 10*3/uL (ref 0.7–4.0)
MCH: 31.4 pg (ref 26.0–34.0)
MCHC: 31.1 g/dL (ref 30.0–36.0)
MCV: 100.9 fL — ABNORMAL HIGH (ref 80.0–100.0)
Monocytes Absolute: 0.8 10*3/uL (ref 0.1–1.0)
Monocytes Relative: 14 %
Neutro Abs: 3.6 10*3/uL (ref 1.7–7.7)
Neutrophils Relative %: 58 %
Platelets: 140 10*3/uL — ABNORMAL LOW (ref 150–400)
RBC: 3.25 MIL/uL — ABNORMAL LOW (ref 4.22–5.81)
RDW: 18.3 % — ABNORMAL HIGH (ref 11.5–15.5)
WBC: 6.1 10*3/uL (ref 4.0–10.5)
nRBC: 0 % (ref 0.0–0.2)

## 2021-11-20 LAB — COMPREHENSIVE METABOLIC PANEL
ALT: 50 U/L — ABNORMAL HIGH (ref 0–44)
AST: 84 U/L — ABNORMAL HIGH (ref 15–41)
Albumin: 2.8 g/dL — ABNORMAL LOW (ref 3.5–5.0)
Alkaline Phosphatase: 185 U/L — ABNORMAL HIGH (ref 38–126)
Anion gap: 13 (ref 5–15)
BUN: 45 mg/dL — ABNORMAL HIGH (ref 8–23)
CO2: 23 mmol/L (ref 22–32)
Calcium: 9.4 mg/dL (ref 8.9–10.3)
Chloride: 102 mmol/L (ref 98–111)
Creatinine, Ser: 5.29 mg/dL — ABNORMAL HIGH (ref 0.61–1.24)
GFR, Estimated: 11 mL/min — ABNORMAL LOW (ref >60)
Glucose, Bld: 138 mg/dL — ABNORMAL HIGH (ref 70–99)
Potassium: 4.9 mmol/L (ref 3.5–5.1)
Sodium: 138 mmol/L (ref 135–145)
Total Bilirubin: 0.8 mg/dL (ref 0.3–1.2)
Total Protein: 6.1 g/dL — ABNORMAL LOW (ref 6.5–8.1)

## 2021-11-20 LAB — GLUCOSE, CAPILLARY
Glucose-Capillary: 125 mg/dL — ABNORMAL HIGH (ref 70–99)
Glucose-Capillary: 97 mg/dL (ref 70–99)
Glucose-Capillary: 221 mg/dL — ABNORMAL HIGH (ref 70–99)
Glucose-Capillary: 104 mg/dL — ABNORMAL HIGH (ref 70–99)

## 2021-11-20 LAB — ECHOCARDIOGRAM COMPLETE
AR max vel: 0.8 cm2
AV Area VTI: 0.75 cm2
AV Area mean vel: 0.73 cm2
AV Mean grad: 23 mmHg
AV Peak grad: 44.4 mmHg
Ao pk vel: 3.33 m/s
Area-P 1/2: 3.77 cm2
Calc EF: 45.9 %
Height: 76 in
MV VTI: 1.65 cm2
S' Lateral: 4 cm
Single Plane A2C EF: 48.5 %
Single Plane A4C EF: 40.6 %
Weight: 4585.57 oz

## 2021-11-20 LAB — HEMOGLOBIN A1C
Hgb A1c MFr Bld: 6 % — ABNORMAL HIGH (ref 4.8–5.6)
Mean Plasma Glucose: 125.5 mg/dL

## 2021-11-20 LAB — HIV ANTIBODY (ROUTINE TESTING W REFLEX): HIV Screen 4th Generation wRfx: NONREACTIVE

## 2021-11-20 LAB — MAGNESIUM: Magnesium: 1.8 mg/dL (ref 1.7–2.4)

## 2021-11-20 MED ORDER — ASPIRIN 81 MG PO CHEW
81.0000 mg | CHEWABLE_TABLET | ORAL | Status: AC
  Administered 2021-11-21: 81 mg via ORAL
  Filled 2021-11-20: qty 1

## 2021-11-20 MED ORDER — AZITHROMYCIN 250 MG PO TABS: ORAL_TABLET | ORAL | 0 refills | Status: DC

## 2021-11-20 MED ORDER — HYDROCORTISONE 1 % EX CREA
TOPICAL_CREAM | Freq: Two times a day (BID) | CUTANEOUS | Status: DC | PRN
  Filled 2021-11-20 (×2): qty 28

## 2021-11-20 MED ORDER — SODIUM CHLORIDE 0.9% FLUSH
3.0000 mL | Freq: Two times a day (BID) | INTRAVENOUS | Status: DC
  Administered 2021-11-20 – 2021-11-24 (×8): 3 mL via INTRAVENOUS

## 2021-11-20 MED ORDER — SODIUM CHLORIDE 0.9 % IV SOLN: INTRAVENOUS | Status: DC

## 2021-11-20 MED ORDER — SODIUM CHLORIDE 0.9% FLUSH: 3.0000 mL | INTRAVENOUS | Status: DC | PRN

## 2021-11-20 MED ORDER — SODIUM CHLORIDE 0.9 % IV SOLN: 250.0000 mL | INTRAVENOUS | Status: DC | PRN

## 2021-11-20 MED ORDER — CEFPODOXIME PROXETIL 200 MG PO TABS
200.0000 mg | ORAL_TABLET | ORAL | 0 refills | Status: DC
Start: 2021-11-20 — End: 2021-11-24

## 2021-11-20 MED ORDER — PREGABALIN 75 MG PO CAPS: 150.0000 mg | ORAL_CAPSULE | Freq: Every day | ORAL | Status: AC

## 2021-11-20 NOTE — Progress Notes (Signed)
Per d/w Dr. Johnsie Cancel, will tentatively put on for [facility] tomorrow - scheduled at 12pm for Dr. Ali Lowe. He discussed procedure with patient. Dr. Johnsie Cancel recommends he be evaluated by the rounding team tomorrow to determine if patient is suitable to proceed with cath or needs to be deferred until Wednesday to allow for HD instead. Given that he'll be NPO after midnight, we will hold his long acting insulin for now until plan clarified for all procedures complete - has SSI ordered. Dr. Bonner Puna is working on IM-to-IM transfer and cardiology will follow at [facility]. I called cardmaster Trish to sign out the hand-off of recommendations above and to have rounding team prioritize eval in AM for timing of cath. Dr. Kyla Balzarine full cardiology note to follow. ? ?

## 2021-11-20 NOTE — Progress Notes (Signed)
*  PRELIMINARY RESULTS* ?Echocardiogram ?2D Echocardiogram has been performed. ? ?[name] ?11/20/2021, 2:33 PM ?

## 2021-11-20 NOTE — Consult Note (Signed)
WOC Nurse Consult Note: ?Reason for Consult:left LE wound, chronic, nonhealing. Followed by Vascular, last seen in March by Dr. Donnetta Hutching. See photo in EHR taken by EDP on admission. ?Wound type: Mixed etiology, peripheral vascular disease ?Pressure Injury POA: Yes/No/NA ?Measurement:To be obtained today by bedside RN and documented on Nursing Flow Sheet ?Wound YOM:AYOK, moist ?Drainage (amount, consistency, odor) small serous ?Periwound: with evidence of previous wound healing, scarring, edema ?Dressing procedure/placement/frequency: ?Patient is managed in the community with topical care and an Unna's Boot. While in house we will provide Nursing with guidance for daily care using a soap and water cleanse and pat dry followed by placement of an antimicrobial nonadherent (xeroform) gauze over the lesion. This is to be topped with a dry gauze, ABD pad and secured with a Kerlix roll gauze wrap from just below toes to just below knee. The Kerlix roll gauze is to be topped with an ACE bandage applied in a similar manner. The heel is to be floated to prevent pressure injury. ?A pressure redistribution chair pad is provided for use when OOB in chair for PI prevention due to altered weight distribution from right BKA. ?Recommend patient return to wound care regimen (topical care, Unna's Boot) used as outpatient post discharge with follow up by Vascular Surgery as directed. ? ?[place] nursing team will not follow, but will remain available to this patient, the nursing and medical teams.  Please re-consult if needed. ?Thanks, ?Maudie Flakes, MSN, RN, [place], [place], [facility]ON-[facility], [place]  ?Pager# [phone]  ? ? ?  ?

## 2021-11-20 NOTE — Plan of Care (Signed)
Pt admitted for syncope. BP 102/70 (BP Location: Right Arm)   Pulse 84   Temp 98.4 ?F (36.9 ?C) (Oral)   Resp 18   Ht '6\' 4"'$  (1.93 m)   Wt 130 kg   SpO2 98%   BMI 34.89 kg/m?   ?Pt on tele with pulse ox, NSR. Pt RLE has some blistering. Pt is BS ACHS was 105, no additional coverage needed. PT given tray for dinner. Bed in lowest position, 2/4 rails up, breaks set and call bell nearby. NO further needs voiced at this time. ?Milta Deiters . ?1:19 AM ?11/20/21 ? ?

## 2021-11-20 NOTE — Assessment & Plan Note (Signed)
LV globally hypokinetic. Given hx CAD but no cardiac catheterization, suspect this would be helpful.  ?- Cardiology consulted, will pursue cath tentatively 4/11. ?- GDMT to be initiated once hemodynamically stabilized. ?

## 2021-11-20 NOTE — Progress Notes (Signed)
?Progress Note ? ?Patient: [name] TIW:[id number] DOB: [birth year]  ?DOA: 11/19/2021  DOS: 11/20/2021  ?  ?Brief hospital course: ?DONA KLEMANN is a 71 y.o. male with a history of ESRD (HD on TTS), COPD, CAD, HTN, PVD s/p R AKA, chronic LLE wound, T2DM who presented to the ED 4/9 after 2 separate episodes of syncope. He reports no prodrome, just waking up on the floor. EMS was reactivated and transported to the ED where BP was noted to be 58/40, 48/28, associated with unresponsiveness. A total of 1.5L IVF was given with improvement in blood pressure and mentation. He was brought in for observation for syncope, given antibiotics for a possible pneumonia. Telemetry revealed short NSVT, some 1st degree AVB. Echo revealed severe aortic stenosis (previously mild in 2505), and LV systolic dysfunction (LVEF 30-35%, new) with global hypokinesis and dilated/plethoric IVC. Cardiology was consulted, recommending transfer to [facility] for cardiac catheterization and consideration of evaluation by structural team for TAVR. ? ?Assessment and Plan: ?* Syncope ?Cardiogenic sounding story and echo confirms causative valvular disease, depressed LVEF. Carotid U/S without severe stenosis.  ?- See plan for aortic stenosis.  ?- Fall precautions.  ?- Continue cardiac monitoring. ? ?HFrEF (heart failure with reduced ejection fraction) ([place]) ?LV globally hypokinetic. Given hx CAD but no cardiac catheterization, suspect this would be helpful.  ?- Cardiology consulted, will pursue cath tentatively 4/11. ?- GDMT to be initiated once hemodynamically stabilized. ? ?Aortic stenosis ?Severe by echo 11/20/2021. Previously said to be mild.  ?- Not likely a candidate for open heart surgery. Plan to have structural team evaluate for TAVR candidacy at [facility].  ? ?GERD (gastroesophageal reflux disease) ?- Continue Protonix ? ?Sepsis ([place]) ?Suspected with subtle CXR findings, hypotension, tachypnea, elevated lactic acid. There is also a possibility that  derangements are hemodynamically medicated primarily and not due to infection. ?- Continue empiric abx. Would hopefully target only 5 days of Tx vs. early discontinuation pending sputum, blood culture data ? ?Hypotension ?Improved. Suspect dialysis-related volume removal with decreased cardiac output ?- Hold home lisinopril, lasix, continue midodrine. ? ?Hypothyroidism ?- Continue Synthroid ? ?DMII (diabetes mellitus, type 2) ([place]) ?- Toujeo 10-15 units at home, given 5u here, hold for now with NPO status tomorrow.  ?- Continue SSI ? ?Peripheral vascular disease, unspecified ([place]) ?s/p right AKA. Previously followed by Dr. [name]. ?- Continue statin ? ?ESRD (end stage renal disease) ([place]) ?Continue routine HD TTS thru LUA AVF (last 4/8). He's now overloaded after IVF were given but no urgent dialysis needs.  ?- Notified nephrology at [facility] of his need to remain inpatient, though he will be requiring dialysis at [facility] if he gets a bed there by tomorrow. I've notified Dr. Carolin Sicks of patient's transfer. ? ?Obesity: Estimated body mass index is 34.89 kg/m? as calculated from the following: ?  Height as of this encounter: '6\' 4"'$  (1.93 m). ?  Weight as of this encounter: 130 kg. ? ?Subjective: Feels fine, wants to go home. No chest pain or dyspnea. Has not gotten up, but denies any falls/lightheadedness. No recent palpitations.  ? ?Objective: ?Vitals:  ? 11/19/21 1900 11/19/21 2100 11/19/21 2211 11/20/21 3976  ?BP: (!) 89/65 94/72 102/70 92/68  ?Pulse:  84  79  ?Resp: '17 18 18 20  '$ ?Temp:    97.6 ?F (36.4 ?C)  ?TempSrc:    Oral  ?SpO2: 98% 98%  91%  ?Weight:      ?Height:      ? ?Gen: Chronically ill-appearing 71 y.o. male  in no distress ?Pulm: Nonlabored breathing room air.  Crackles at bases. ?CV: Regular rate and rhythm. No murmur, rub, or gallop. +JVD this PM, +LLE dependent edema. ?GI: Abdomen soft, non-tender, non-distended, with normoactive bowel sounds.  ?Ext: Warm, right AKA appears normal. Left LE with chronic  draining wounds and edema, no purulence noted. LUA AVF +thrill ?Skin: As above, otherwise no rashes, lesions or ulcers on visualized skin. ?Neuro: Alert and oriented. No focal neurological deficits. ?Psych: Judgement and insight appear fair. Mood euthymic & affect congruent. Behavior is appropriate.   ? ?Data Personally reviewed: ?CBC: ?Recent Labs  ?Lab 11/19/21 ?1401 11/20/21 ?3299  ?WBC 5.6 6.1  ?NEUTROABS 3.9 3.6  ?HGB 8.7* 10.2*  ?HCT 27.4* 32.8*  ?MCV 101.5* 100.9*  ?PLT 155 140*  ? ?Basic Metabolic Panel: ?Recent Labs  ?Lab 11/19/21 ?1401 11/20/21 ?2426  ?NA 139 138  ?K 4.3 4.9  ?CL 102 102  ?CO2 23 23  ?GLUCOSE 135* 138*  ?BUN 38* 45*  ?CREATININE 4.39* 5.29*  ?CALCIUM 8.9 9.4  ?MG  --  1.8  ? ?GFR: ?Estimated Creatinine Clearance: 18.9 mL/min (A) (by C-G formula based on SCr of 5.29 mg/dL (H)). ?Liver Function Tests: ?Recent Labs  ?Lab 11/19/21 ?1401 11/20/21 ?8341  ?AST 74* 84*  ?ALT 47* 50*  ?ALKPHOS 188* 185*  ?BILITOT 0.7 0.8  ?PROT 5.8* 6.1*  ?ALBUMIN 2.7* 2.8*  ? ?HbA1C: ?Recent Labs  ?  11/20/21 ?9622  ?HGBA1C 6.0*  ? ?HEAD CT 1. No acute intracranial abnormalities. CERVICAL CT 1. No fracture or acute finding. 2. Bilateral pleural effusions. ? ?CAROTID US ?1. Right carotid artery system: Less than 50% stenosis secondary to mild atherosclerotic plaque formation in the proximal internal carotid artery. 2. Left carotid artery system: Patent without significant atherosclerotic plaque formation. 3.  Vertebral artery system: Patent with antegrade flow bilaterally. Ruthann Cancer, MD [facility] Radiology Electronically Signed   By: Ruthann Cancer M.D.   On: 11/20/2021 15:32  ? ?ECHOCARDIOGRAM COMPLETE ?1. Left ventricular ejection fraction, by estimation, is 30 to 35%. The left ventricle has moderately decreased function. The left ventricle demonstrates global hypokinesis. The left ventricular internal cavity size was mildly dilated. There is moderate  left  ventricular hypertrophy. Left ventricular diastolic parameters are indeterminate.  2. Right ventricular systolic function is moderately reduced. The right ventricular size is mildly enlarged. There is mildly elevated pulmonary artery systolic pressure.  3. Left atrial size was moderately dilated.  4. The mitral valve is degenerative. Mild mitral valve regurgitation. No evidence of mitral stenosis. Moderate mitral annular calcification.  5. Severe low flow AS with moderately decreased EF. AS/decreased EF new since 2021 . The aortic valve was not well visualized. There is severe calcifcation of the aortic valve. There is severe thickening of the aortic valve. Aortic valve regurgitation is trivial. Severe aortic valve stenosis.  6. The inferior vena cava is dilated in size with <50% respiratory variability, suggesting right atrial pressure of 15 mmHg.  ? ?Family Communication: At bedside ? ?Disposition: ?Status is: Inpatient ?Remains inpatient appropriate because: Continued work up of new CHF, AS, syncope at [facility]. ?Planned Discharge Destination: Home ? ? ?Patrecia Pour, MD ?11/20/2021 5:33 PM ?Page by [email or web address]  ?

## 2021-11-20 NOTE — Assessment & Plan Note (Addendum)
Severe by echo 11/20/2021. Previously said to be mild.  ?- Not likely a candidate for open heart surgery. Per cards ?

## 2021-11-20 NOTE — Assessment & Plan Note (Addendum)
-   s/p right AKA. Previously followed by Dr. [name]. ?- Continue statin ?

## 2021-11-20 NOTE — TOC Progression Note (Signed)
?  Transition of Care (TOC) Screening Note ? ? ?Patient Details  ?Name: [name] ?Date of Birth: Aug 06, 1951 ? ? ?Transition of Care (TOC) CM/SW Contact:    ?Boneta Lucks, RN ?Phone Number: ?11/20/2021, 11:31 AM ? ?Transition of Care Department [facility]) has reviewed patient and no TOC needs have been identified at this time. We will continue to monitor patient advancement through interdisciplinary progression rounds. If new patient transition needs arise, please place a TOC consult. ? ?  ?Barriers to Discharge: Continued Medical Work up ? ? ?  ?  ?                ?  ?  ?  ?  ?  ?  ?  ?  ?  ?  ? ? ? ? ?

## 2021-11-20 NOTE — Plan of Care (Signed)
  Problem: Education: Goal: Knowledge of General Education information will improve Description Including pain rating scale, medication(s)/side effects and non-pharmacologic comfort measures Outcome: Progressing   Problem: Health Behavior/Discharge Planning: Goal: Ability to manage health-related needs will improve Outcome: Progressing   

## 2021-11-20 NOTE — H&P (View-Only) (Signed)
CARDIOLOGY CONSULT NOTE  ? ? ? ? ? ?Patient ID: ?[name] ?MRN: [id number] ?DOB/AGE: Sep 18, 1950 71 y.o. ? ?Admit date: 11/19/2021 ?Referring Physician: Bonner Puna ?Primary Physician: Caryl Bis, MD ?Primary Cardiologist: Domenic Polite ?Reason for Consultation: Syncope ? ?Principal Problem: ?  Syncope ?Active Problems: ?  ESRD (end stage renal disease) ([place]) ?  DMII (diabetes mellitus, type 2) ([place]) ?  Hypothyroidism ?  Hypotension ?  Sepsis ([place]) ?  GERD (gastroesophageal reflux disease) ? ? ?HPI:  71 y.o. [facility] with two syncopal episodes. Chronically ill with multiple co morbidities. CRF on dialysis with low BP. Right AKA for infection. Still smoking with COPD. Last seen by Dr Domenic Polite 07/17/21 CAD by non invasive testing but has not been cathed. Myovue 04/01/20 EF 39% Large inferior/apical defect no ischemia. He was not cathed  ? ?He sees Dr Donnetta Hutching for vascular. Had a left CEA 10/24/20 with previous right occipital stroke He has had left femoral to popliteal bypass for wound issues 03/2020 His fistula is active in LUE and has a non functional one in radial on left. 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 ?Admitted 11/19/21 with 2 syncopal episodes 10 minutes apart First one sitting in chair EMS helped him up and he passed out again after getting in bed Bending over in chair makes him dizzy and BP has been low on dialysis No antecedent palpitations, chest pain dyspnea  ? ?TTE read by myself today showed marked change in EF previously normal now 30-35% with severe low flow AS mean gradient 23 peak 44.4 mmHg AVA 0.73 and DVI 0.24  ? ?Telemetry with NSR ECG ? 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 ?Long discussion with primary patient and wife. Syncope concerning for cardiac etiology with likely CAD by previous myovue, severe AS and newly reduced EF.  ? ? ? ?ROS ?All other systems reviewed and negative except as noted above ? ?Past Medical History:  ?Diagnosis Date  ?  Anemia   ? Anxiety   ? Arthritis   ? CAD (coronary artery disease)   ? Myoview August 2021 with evidence of large inferior scar but no active ischemia  ? COPD (chronic obstructive pulmonary disease) ([place])   ? Depression   ? ESRD on hemodialysis ([place])   ? Essential hypertension   ? GERD (gastroesophageal reflux disease)   ? H/O hiatal hernia   ? Headache(784.0)   ? History of kidney stones   ? History of pneumonia   ? Hypothyroidism   ? Neuropathy   ? Non-healing wound of amputation stump ([place])   ? Right  ? Peripheral vascular disease ([place])   ? PTSD (post-traumatic stress disorder)   ? Type 2 diabetes mellitus ([place])   ?  ?Family History  ?Problem Relation Age of Onset  ? Heart disease Father   ?     Heart Disease before age 24  ? Healthy Daughter   ? Healthy Daughter   ?  ?Social History  ? ?Socioeconomic History  ? Marital status: Married  ?  Spouse name: [name]  ? Number of children: Not on file  ? Years of education: Not on file  ? Highest education level: Not on file  ?Occupational History  ? Occupation: retired  ?Tobacco Use  ? Smoking status: Every Day  ?  Packs/day: 0.50  ?  Years: 18.00  ?  Pack years: 9.00  ?  Types: Cigarettes  ?  Last attempt to quit: 06/27/2020  ?  Years since quitting: 1.4  ? Smokeless tobacco: Never  ?Vaping Use  ? Vaping Use: Never used  ?Substance and Sexual Activity  ? Alcohol use: No  ?  Alcohol/week: 0.0 standard drinks  ? Drug use: Yes  ?  Types: Marijuana  ?  Comment: smokes everyday for pain relief  ? Sexual activity: Not on file  ?Other Topics Concern  ? Not on file  ?Social History Narrative  ? Mr [name] is a 71 year old retired patient who lives with wife [name], his primary caregiver. He reports he is independent/assist with his care needs   ? He has transportation to medical appointments  ? ?Social Determinants of Health  ? ?Financial Resource Strain: Not on file  ?Food Insecurity: Not on file  ?Transportation Needs: Not on file  ?Physical Activity: Not on file  ?Stress:  Not on file  ?Social Connections: Not on file  ?Intimate Partner Violence: Not on file  ?  ?Past Surgical History:  ?Procedure Laterality Date  ? A/V FISTULAGRAM Left 01/16/2019  ? Procedure: A/V FISTULAGRAM;  Surgeon: [name], MD;  Location: [place] [facility];  Service: Cardiovascular;  Laterality: Left;  ? ABDOMINAL AORTOGRAM W/LOWER EXTREMITY Bilateral 03/18/2020  ? Procedure: ABDOMINAL AORTOGRAM W/LOWER EXTREMITY;  Surgeon: [name], MD;  Location: [place] [facility];  Service: Cardiovascular;  Laterality: Bilateral;  ? AMPUTATION Right 12/01/2018  ? Procedure: RIGHT AMPUTATION BELOW KNEE;  Surgeon: [name], MD;  Location: [facility] OR;  Service: Vascular;  Laterality: Right;  ? AMPUTATION Right 04/27/2019  ? Procedure: AMPUTATION BELOW KNEE REVISION;  Surgeon: [name], MD;  Location: [facility] OR;  Service: Vascular;  Laterality: Right;  ? AMPUTATION Right 04/30/2019  ? Procedure: AMPUTATION ABOVE KNEE - RIGHT;  Surgeon: Waynetta Sandy, MD;  Location: [place];  Service: Vascular;  Laterality: Right;  ? APPLICATION OF WOUND VAC Right 04/27/2019  ? Procedure: APPLICATION OF WOUND VAC;  Surgeon: [name], MD;  Location: [place];  Service: Vascular;  Laterality: Right;  ? AV FISTULA PLACEMENT  2012  ?    left arm   ? AV FISTULA PLACEMENT Left 12/18/2012  ? Procedure: ARTERIOVENOUS (AV) FISTULA CREATION;  Surgeon: Angelia Mould, MD;  Location: [place];  Service: Vascular;  Laterality: Left;  ? COLONOSCOPY  10/26/2011  ? Procedure: COLONOSCOPY;  Surgeon: Rogene Houston, MD;  [facility];  Service: Endoscopy;  Laterality: N/A;  730  ? EMBOLECTOMY Left 12/09/2012  ? Procedure: EMBOLECTOMY;  Surgeon: Serafina Mitchell, MD;  Location: [facility] CATH LAB;  Service: Cardiovascular;  Laterality: Left;  left arm venous embolization  ? ENDARTERECTOMY Left 10/24/2020  ? Procedure: LEFT CAROTID ENDARTERECTOMY;  Surgeon: [name], MD;  Location: [facility] OR;  Service: Vascular;   Laterality: Left;  ? ENDARTERECTOMY FEMORAL Left 04/04/2020  ? Procedure: ENDARTERECTOMY COMMON FEMORAL;  Surgeon: [name], MD;  Location: [facility] OR;  Service: Vascular;  Laterality: Left;  ? ESOPHAGOGASTRODUODENOSCOPY (EGD) WITH ESOPHAGEAL DILATION N/A 04/23/2013  ? Procedure: ESOPHAGOGASTRODUODENOSCOPY (EGD) WITH ESOPHAGEAL DILATION;  Surgeon: Rogene Houston, MD;  [facility];  Service: Endoscopy;  Laterality: N/A;  200-moved to 930   ? FEMORAL-POPLITEAL BYPASS GRAFT Left 04/04/2020  ? Procedure: FEMORAL- BELOW KNEE POPLITEAL BYPASS GRAFT NON REVERSED VEIN;  Surgeon: [name], MD;  Location: [place];  Service: Vascular;  Laterality: Left;  ? FISTULA SUPERFICIALIZATION Left 06/18/2013  ?  Procedure: FISTULA SUPERFICIALIZATION & LIGATION BRANCH X 1;  Surgeon: Mal Misty, MD;  Location: [place];  Service: Vascular;  Laterality: Left;  ? GROIN DEBRIDEMENT Left 04/29/2020  ? Procedure: EXPLORATION LEFT GROIN WITH DEBRIDEMENT;  Surgeon: Rosetta Posner, MD;  Location: [place];  Service: Vascular;  Laterality: Left;  ? HEMATOMA EVACUATION Right 02/11/2017  ? Procedure: EVACUATION HEMATOMA RIGHT GROIN, Repair of Right Pseudo-anerysm.;  Surgeon: [name], MD;  [facility] OR;  Service: Vascular;  Laterality: Right;  ? INGUINAL HERNIA REPAIR    ? ,  times   2  ? INSERTION OF DIALYSIS CATHETER Left 12/18/2012  ? Procedure: INSERTION OF DIALYSIS CATHETER;  Surgeon: Angelia Mould, MD;  Location: [place];  Service: Vascular;  Laterality: Left;  ? KNEE ARTHROSCOPY  2011  ? Right Knee  ? LOWER EXTREMITY ANGIOGRAPHY N/A 02/11/2017  ? Procedure: Lower Extremity Angiography;  Surgeon: Lorretta Harp, MD;  Location: [place] [facility];  Service: Cardiovascular;  Laterality: N/A;  ? PERIPHERAL ATHRECTOMY  02/11/2017  ? PERIPHERAL VASCULAR ATHERECTOMY Left 02/11/2017  ? Procedure: Peripheral Vascular Atherectomy;  Surgeon: Lorretta Harp, MD;  Location: [place] [facility];  Service: Cardiovascular;   Laterality: Left;  ? PERIPHERAL VASCULAR BALLOON ANGIOPLASTY Left 01/16/2019  ? Procedure: PERIPHERAL VASCULAR BALLOON ANGIOPLASTY;  Surgeon: [name], MD;  Location: [place] [facility];  Service: [employer]

## 2021-11-20 NOTE — Progress Notes (Signed)
Discharge is complete waiting on ECHO results  ?

## 2021-11-20 NOTE — Progress Notes (Signed)
Report given to Dianna RN

## 2021-11-20 NOTE — Consult Note (Signed)
CARDIOLOGY CONSULT NOTE  ? ? ? ? ? ?Patient ID: ?[name] ?MRN: [id number] ?DOB/AGE: Jun 28, 1951 71 y.o. ? ?Admit date: 11/19/2021 ?Referring Physician: Bonner Puna ?Primary Physician: Caryl Bis, MD ?Primary Cardiologist: Domenic Polite ?Reason for Consultation: Syncope ? ?Principal Problem: ?  Syncope ?Active Problems: ?  ESRD (end stage renal disease) ([place]) ?  DMII (diabetes mellitus, type 2) ([place]) ?  Hypothyroidism ?  Hypotension ?  Sepsis ([place]) ?  GERD (gastroesophageal reflux disease) ? ? ?HPI:  71 y.o. admitted to [facility] with two syncopal episodes. Chronically ill with multiple co morbidities. CRF on dialysis with low BP. Right AKA for infection. Still smoking with COPD. Last seen by Dr Domenic Polite 07/17/21 CAD by non invasive testing but has not been cathed. Myovue 04/01/20 EF 39% Large inferior/apical defect no ischemia. He was not cathed  ? ?He sees Dr Donnetta Hutching for vascular. Had a left CEA 10/24/20 with previous right occipital stroke He has had left femoral to popliteal bypass for wound issues 03/2020 His fistula is active in LUE and has a non functional one in radial on left. 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 ?Admitted 11/19/21 with 2 syncopal episodes 10 minutes apart First one sitting in chair EMS helped him up and he passed out again after getting in bed Bending over in chair makes him dizzy and BP has been low on dialysis No antecedent palpitations, chest pain dyspnea  ? ?TTE read by myself today showed marked change in EF previously normal now 30-35% with severe low flow AS mean gradient 23 peak 44.4 mmHg AVA 0.73 and DVI 0.24  ? ?Telemetry with NSR ECG ? 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 ?Long discussion with primary patient and wife. Syncope concerning for cardiac etiology with likely CAD by previous myovue, severe AS and newly reduced EF.  ? ? ? ?ROS ?All other systems reviewed and negative except as noted above ? ?Past Medical History:  ?Diagnosis Date  ?  Anemia   ? Anxiety   ? Arthritis   ? CAD (coronary artery disease)   ? Myoview August 2021 with evidence of large inferior scar but no active ischemia  ? COPD (chronic obstructive pulmonary disease) ([place])   ? Depression   ? ESRD on hemodialysis ([place])   ? Essential hypertension   ? GERD (gastroesophageal reflux disease)   ? H/O hiatal hernia   ? Headache(784.0)   ? History of kidney stones   ? History of pneumonia   ? Hypothyroidism   ? Neuropathy   ? Non-healing wound of amputation stump ([place])   ? Right  ? Peripheral vascular disease ([place])   ? PTSD (post-traumatic stress disorder)   ? Type 2 diabetes mellitus ([place])   ?  ?Family History  ?Problem Relation Age of Onset  ? Heart disease Father   ?     Heart Disease before age 41  ? Healthy Daughter   ? Healthy Daughter   ?  ?Social History  ? ?Socioeconomic History  ? Marital status: Married  ?  Spouse name: [name]  ? Number of children: Not on file  ? Years of education: Not on file  ? Highest education level: Not on file  ?Occupational History  ? Occupation: retired  ?Tobacco Use  ? Smoking status: Every Day  ?  Packs/day: 0.50  ?  Years: 18.00  ?  Pack years: 9.00  ?  Types: Cigarettes  ?  Last attempt to quit: 06/27/2020  ?  Years since quitting: 1.4  ? Smokeless tobacco: Never  ?Vaping Use  ? Vaping Use: Never used  ?Substance and Sexual Activity  ? Alcohol use: No  ?  Alcohol/week: 0.0 standard drinks  ? Drug use: Yes  ?  Types: Marijuana  ?  Comment: smokes everyday for pain relief  ? Sexual activity: Not on file  ?Other Topics Concern  ? Not on file  ?Social History Narrative  ? [name] is a 71 year old retired patient who lives with wife [name], his primary caregiver. He reports he is independent/assist with his care needs   ? He has transportation to medical appointments  ? ?Social Determinants of Health  ? ?Financial Resource Strain: Not on file  ?Food Insecurity: Not on file  ?Transportation Needs: Not on file  ?Physical Activity: Not on file  ?Stress:  Not on file  ?Social Connections: Not on file  ?Intimate Partner Violence: Not on file  ?  ?Past Surgical History:  ?Procedure Laterality Date  ? A/V FISTULAGRAM Left 01/16/2019  ? Procedure: A/V FISTULAGRAM;  Surgeon: [name], MD;  Location: [place] [facility];  Service: Cardiovascular;  Laterality: Left;  ? ABDOMINAL AORTOGRAM W/LOWER EXTREMITY Bilateral 03/18/2020  ? Procedure: ABDOMINAL AORTOGRAM W/LOWER EXTREMITY;  Surgeon: [name], MD;  Location: [place] [facility];  Service: Cardiovascular;  Laterality: Bilateral;  ? AMPUTATION Right 12/01/2018  ? Procedure: RIGHT AMPUTATION BELOW KNEE;  Surgeon: [name], MD;  Location: [facility] OR;  Service: Vascular;  Laterality: Right;  ? AMPUTATION Right 04/27/2019  ? Procedure: AMPUTATION BELOW KNEE REVISION;  Surgeon: [name], MD;  Location: [facility] OR;  Service: Vascular;  Laterality: Right;  ? AMPUTATION Right 04/30/2019  ? Procedure: AMPUTATION ABOVE KNEE - RIGHT;  Surgeon: Waynetta Sandy, MD;  Location: [place];  Service: Vascular;  Laterality: Right;  ? APPLICATION OF WOUND VAC Right 04/27/2019  ? Procedure: APPLICATION OF WOUND VAC;  Surgeon: [name], MD;  Location: [place];  Service: Vascular;  Laterality: Right;  ? AV FISTULA PLACEMENT  2012  ?    left arm   ? AV FISTULA PLACEMENT Left 12/18/2012  ? Procedure: ARTERIOVENOUS (AV) FISTULA CREATION;  Surgeon: Angelia Mould, MD;  Location: [place];  Service: Vascular;  Laterality: Left;  ? COLONOSCOPY  10/26/2011  ? Procedure: COLONOSCOPY;  Surgeon: Rogene Houston, MD;  [facility];  Service: Endoscopy;  Laterality: N/A;  730  ? EMBOLECTOMY Left 12/09/2012  ? Procedure: EMBOLECTOMY;  Surgeon: Serafina Mitchell, MD;  Location: [facility] CATH LAB;  Service: Cardiovascular;  Laterality: Left;  left arm venous embolization  ? ENDARTERECTOMY Left 10/24/2020  ? Procedure: LEFT CAROTID ENDARTERECTOMY;  Surgeon: [name], MD;  Location: [facility] OR;  Service: Vascular;   Laterality: Left;  ? ENDARTERECTOMY FEMORAL Left 04/04/2020  ? Procedure: ENDARTERECTOMY COMMON FEMORAL;  Surgeon: [name], MD;  Location: [facility] OR;  Service: Vascular;  Laterality: Left;  ? ESOPHAGOGASTRODUODENOSCOPY (EGD) WITH ESOPHAGEAL DILATION N/A 04/23/2013  ? Procedure: ESOPHAGOGASTRODUODENOSCOPY (EGD) WITH ESOPHAGEAL DILATION;  Surgeon: Rogene Houston, MD;  [facility];  Service: Endoscopy;  Laterality: N/A;  200-moved to 930   ? FEMORAL-POPLITEAL BYPASS GRAFT Left 04/04/2020  ? Procedure: FEMORAL- BELOW KNEE POPLITEAL BYPASS GRAFT NON REVERSED VEIN;  Surgeon: [name], MD;  Location: [place];  Service: Vascular;  Laterality: Left;  ? FISTULA SUPERFICIALIZATION Left 06/18/2013  ?  Procedure: FISTULA SUPERFICIALIZATION & LIGATION BRANCH X 1;  Surgeon: Mal Misty, MD;  Location: [place];  Service: Vascular;  Laterality: Left;  ? GROIN DEBRIDEMENT Left 04/29/2020  ? Procedure: EXPLORATION LEFT GROIN WITH DEBRIDEMENT;  Surgeon: Rosetta Posner, MD;  Location: [place];  Service: Vascular;  Laterality: Left;  ? HEMATOMA EVACUATION Right 02/11/2017  ? Procedure: EVACUATION HEMATOMA RIGHT GROIN, Repair of Right Pseudo-anerysm.;  Surgeon: [name], MD;  [facility];  Service: Vascular;  Laterality: Right;  ? INGUINAL HERNIA REPAIR    ? ,  times   2  ? INSERTION OF DIALYSIS CATHETER Left 12/18/2012  ? Procedure: INSERTION OF DIALYSIS CATHETER;  Surgeon: Angelia Mould, MD;  Location: [place];  Service: Vascular;  Laterality: Left;  ? KNEE ARTHROSCOPY  2011  ? Right Knee  ? LOWER EXTREMITY ANGIOGRAPHY N/A 02/11/2017  ? Procedure: Lower Extremity Angiography;  Surgeon: Lorretta Harp, MD;  Location: [place] [facility];  Service: Cardiovascular;  Laterality: N/A;  ? PERIPHERAL ATHRECTOMY  02/11/2017  ? PERIPHERAL VASCULAR ATHERECTOMY Left 02/11/2017  ? Procedure: Peripheral Vascular Atherectomy;  Surgeon: Lorretta Harp, MD;  Location: [place] [facility];  Service: Cardiovascular;   Laterality: Left;  ? PERIPHERAL VASCULAR BALLOON ANGIOPLASTY Left 01/16/2019  ? Procedure: PERIPHERAL VASCULAR BALLOON ANGIOPLASTY;  Surgeon: [name], MD;  Location: [place] [facility];  Service: [employer]

## 2021-11-20 NOTE — Progress Notes (Signed)
[facility] came to transport patient to [facility] for a cardiac cath. Patient was alert and oriented x4 and VSS. ?

## 2021-11-21 ENCOUNTER — Encounter (HOSPITAL_COMMUNITY)
Admission: EM | Disposition: A | Discharge status: Home Health | Payer: Self-pay | Source: Home / Self Care | Attending: Internal Medicine

## 2021-11-21 DIAGNOSIS — N186 End stage renal disease: Secondary | ICD-10-CM | POA: Diagnosis not present

## 2021-11-21 DIAGNOSIS — I350 Nonrheumatic aortic (valve) stenosis: Secondary | ICD-10-CM | POA: Diagnosis not present

## 2021-11-21 DIAGNOSIS — I2510 Atherosclerotic heart disease of native coronary artery without angina pectoris: Secondary | ICD-10-CM | POA: Diagnosis not present

## 2021-11-21 DIAGNOSIS — E1159 Type 2 diabetes mellitus with other circulatory complications: Secondary | ICD-10-CM | POA: Diagnosis not present

## 2021-11-21 DIAGNOSIS — R55 Syncope and collapse: Secondary | ICD-10-CM | POA: Diagnosis not present

## 2021-11-21 DIAGNOSIS — I5020 Unspecified systolic (congestive) heart failure: Secondary | ICD-10-CM | POA: Diagnosis not present

## 2021-11-21 HISTORY — PX: RIGHT HEART CATH AND CORONARY ANGIOGRAPHY: CATH118264

## 2021-11-21 LAB — GLUCOSE, CAPILLARY
Glucose-Capillary: 91 mg/dL (ref 70–99)
Glucose-Capillary: 65 mg/dL — ABNORMAL LOW (ref 70–99)
Glucose-Capillary: 90 mg/dL (ref 70–99)
Glucose-Capillary: 59 mg/dL — ABNORMAL LOW (ref 70–99)
Glucose-Capillary: 81 mg/dL (ref 70–99)
Glucose-Capillary: 182 mg/dL — ABNORMAL HIGH (ref 70–99)

## 2021-11-21 LAB — POCT I-STAT EG7
Acid-Base Excess: 1 mmol/L (ref 0.0–2.0)
Acid-Base Excess: 2 mmol/L (ref 0.0–2.0)
Bicarbonate: 27.5 mmol/L (ref 20.0–28.0)
Bicarbonate: 27.8 mmol/L (ref 20.0–28.0)
Calcium, Ion: 1.17 mmol/L (ref 1.15–1.40)
Calcium, Ion: 1.18 mmol/L (ref 1.15–1.40)
HCT: 31 % — ABNORMAL LOW (ref 39.0–52.0)
HCT: 31 % — ABNORMAL LOW (ref 39.0–52.0)
Hemoglobin: 10.5 g/dL — ABNORMAL LOW (ref 13.0–17.0)
Hemoglobin: 10.5 g/dL — ABNORMAL LOW (ref 13.0–17.0)
O2 Saturation: 61 %
O2 Saturation: 65 %
Potassium: 3.9 mmol/L (ref 3.5–5.1)
Potassium: 3.9 mmol/L (ref 3.5–5.1)
Sodium: 138 mmol/L (ref 135–145)
Sodium: 138 mmol/L (ref 135–145)
TCO2: 29 mmol/L (ref 22–32)
TCO2: 29 mmol/L (ref 22–32)
pCO2, Ven: 49.6 mmHg (ref 44–60)
pCO2, Ven: 50.5 mmHg (ref 44–60)
pH, Ven: 7.349 (ref 7.25–7.43)
pH, Ven: 7.352 (ref 7.25–7.43)
pO2, Ven: 34 mmHg (ref 32–45)
pO2, Ven: 36 mmHg (ref 32–45)

## 2021-11-21 LAB — CBC
HCT: 32.6 % — ABNORMAL LOW (ref 39.0–52.0)
Hemoglobin: 10.8 g/dL — ABNORMAL LOW (ref 13.0–17.0)
MCH: 32.6 pg (ref 26.0–34.0)
MCHC: 33.1 g/dL (ref 30.0–36.0)
MCV: 98.5 fL (ref 80.0–100.0)
Platelets: 166 10*3/uL (ref 150–400)
RBC: 3.31 MIL/uL — ABNORMAL LOW (ref 4.22–5.81)
RDW: 18.3 % — ABNORMAL HIGH (ref 11.5–15.5)
WBC: 8.2 10*3/uL (ref 4.0–10.5)
nRBC: 0 % (ref 0.0–0.2)

## 2021-11-21 LAB — POCT I-STAT 7, (LYTES, BLD GAS, ICA,H+H)
Acid-Base Excess: 1 mmol/L (ref 0.0–2.0)
Bicarbonate: 26.2 mmol/L (ref 20.0–28.0)
Calcium, Ion: 1.16 mmol/L (ref 1.15–1.40)
HCT: 31 % — ABNORMAL LOW (ref 39.0–52.0)
Hemoglobin: 10.5 g/dL — ABNORMAL LOW (ref 13.0–17.0)
O2 Saturation: 96 %
Potassium: 3.9 mmol/L (ref 3.5–5.1)
Sodium: 138 mmol/L (ref 135–145)
TCO2: 28 mmol/L (ref 22–32)
pCO2 arterial: 45.1 mmHg (ref 32–48)
pH, Arterial: 7.373 (ref 7.35–7.45)
pO2, Arterial: 85 mmHg (ref 83–108)

## 2021-11-21 LAB — RENAL FUNCTION PANEL
Albumin: 2.7 g/dL — ABNORMAL LOW (ref 3.5–5.0)
Anion gap: 14 (ref 5–15)
BUN: 57 mg/dL — ABNORMAL HIGH (ref 8–23)
CO2: 20 mmol/L — ABNORMAL LOW (ref 22–32)
Calcium: 9.4 mg/dL (ref 8.9–10.3)
Chloride: 105 mmol/L (ref 98–111)
Creatinine, Ser: 6.58 mg/dL — ABNORMAL HIGH (ref 0.61–1.24)
GFR, Estimated: 8 mL/min — ABNORMAL LOW (ref >60)
Glucose, Bld: 92 mg/dL (ref 70–99)
Phosphorus: 8.9 mg/dL — ABNORMAL HIGH (ref 2.5–4.6)
Potassium: 5.1 mmol/L (ref 3.5–5.1)
Sodium: 139 mmol/L (ref 135–145)

## 2021-11-21 LAB — HEPATITIS B SURFACE ANTIGEN: Hepatitis B Surface Ag: NONREACTIVE

## 2021-11-21 LAB — HEPATITIS B SURFACE ANTIBODY,QUALITATIVE: Hep B S Ab: REACTIVE — AB

## 2021-11-21 SURGERY — RIGHT HEART CATH AND CORONARY ANGIOGRAPHY
Anesthesia: LOCAL

## 2021-11-21 MED ORDER — AZITHROMYCIN 250 MG PO TABS
500.0000 mg | ORAL_TABLET | Freq: Every day | ORAL | Status: DC
  Administered 2021-11-21: 500 mg via ORAL
  Filled 2021-11-21: qty 2

## 2021-11-21 MED ORDER — LIDOCAINE HCL (PF) 1 % IJ SOLN
INTRAMUSCULAR | Status: AC
  Filled 2021-11-21: qty 30

## 2021-11-21 MED ORDER — SODIUM CHLORIDE 0.9 % IV SOLN: 100.0000 mL | INTRAVENOUS | Status: DC | PRN

## 2021-11-21 MED ORDER — SODIUM CHLORIDE 0.9 % IV SOLN: 250.0000 mL | INTRAVENOUS | Status: DC | PRN

## 2021-11-21 MED ORDER — MIDAZOLAM HCL 2 MG/2ML IJ SOLN
INTRAMUSCULAR | Status: AC
Start: 2021-11-21 — End: ?
  Filled 2021-11-21: qty 2

## 2021-11-21 MED ORDER — LIDOCAINE-PRILOCAINE 2.5-2.5 % EX CREA
1.0000 "application " | TOPICAL_CREAM | CUTANEOUS | Status: DC | PRN
  Filled 2021-11-21: qty 5

## 2021-11-21 MED ORDER — AZITHROMYCIN 250 MG PO TABS: 500.0000 mg | ORAL_TABLET | Freq: Every day | ORAL | Status: DC

## 2021-11-21 MED ORDER — FENTANYL CITRATE (PF) 100 MCG/2ML IJ SOLN
INTRAMUSCULAR | Status: AC
  Filled 2021-11-21: qty 2

## 2021-11-21 MED ORDER — SODIUM CHLORIDE 0.9% FLUSH: 3.0000 mL | INTRAVENOUS | Status: DC | PRN

## 2021-11-21 MED ORDER — ALTEPLASE 2 MG IJ SOLR: 2.0000 mg | Freq: Once | INTRAMUSCULAR | Status: DC | PRN

## 2021-11-21 MED ORDER — IOHEXOL 350 MG/ML SOLN
INTRAVENOUS | Status: DC | PRN
  Administered 2021-11-21: 50 mL

## 2021-11-21 MED ORDER — HEPARIN (PORCINE) IN NACL 1000-0.9 UT/500ML-% IV SOLN
INTRAVENOUS | Status: AC
  Filled 2021-11-21: qty 1000

## 2021-11-21 MED ORDER — HEPARIN SODIUM (PORCINE) 1000 UNIT/ML DIALYSIS
1000.0000 [IU] | INTRAMUSCULAR | Status: DC | PRN
  Administered 2021-11-23: 1000 [IU] via INTRAVENOUS_CENTRAL
  Filled 2021-11-21: qty 1

## 2021-11-21 MED ORDER — INSULIN ASPART 100 UNIT/ML IJ SOLN: 0.0000 [IU] | Freq: Every day | INTRAMUSCULAR | Status: DC

## 2021-11-21 MED ORDER — DEXTROSE 50 % IV SOLN
INTRAVENOUS | Status: AC
  Filled 2021-11-21: qty 50

## 2021-11-21 MED ORDER — PENTAFLUOROPROP-TETRAFLUOROETH EX AERO: 1.0000 "application " | INHALATION_SPRAY | CUTANEOUS | Status: DC | PRN

## 2021-11-21 MED ORDER — MIDAZOLAM HCL 2 MG/2ML IJ SOLN
INTRAMUSCULAR | Status: DC | PRN
  Administered 2021-11-21: 1 mg via INTRAVENOUS

## 2021-11-21 MED ORDER — HEPARIN (PORCINE) IN NACL 1000-0.9 UT/500ML-% IV SOLN
INTRAVENOUS | Status: DC | PRN
  Administered 2021-11-21 (×2): 500 mL

## 2021-11-21 MED ORDER — CALCITRIOL 0.25 MCG PO CAPS
0.2500 ug | ORAL_CAPSULE | ORAL | Status: DC
  Administered 2021-11-21 – 2021-11-23 (×3): 0.25 ug via ORAL
  Filled 2021-11-21 (×3): qty 1

## 2021-11-21 MED ORDER — HYDRALAZINE HCL 20 MG/ML IJ SOLN: 10.0000 mg | INTRAMUSCULAR | Status: AC | PRN

## 2021-11-21 MED ORDER — INSULIN ASPART 100 UNIT/ML IJ SOLN
0.0000 [IU] | Freq: Three times a day (TID) | INTRAMUSCULAR | Status: DC
  Administered 2021-11-22 – 2021-11-24 (×3): 1 [IU] via SUBCUTANEOUS

## 2021-11-21 MED ORDER — LIDOCAINE HCL (PF) 1 % IJ SOLN: 5.0000 mL | INTRAMUSCULAR | Status: DC | PRN

## 2021-11-21 MED ORDER — FENTANYL CITRATE (PF) 100 MCG/2ML IJ SOLN
INTRAMUSCULAR | Status: DC | PRN
  Administered 2021-11-21: 25 ug via INTRAVENOUS

## 2021-11-21 MED ORDER — LIDOCAINE HCL (PF) 1 % IJ SOLN
INTRAMUSCULAR | Status: DC | PRN
  Administered 2021-11-21: 15 mL

## 2021-11-21 MED ORDER — CHLORHEXIDINE GLUCONATE CLOTH 2 % EX PADS
6.0000 | MEDICATED_PAD | Freq: Every day | CUTANEOUS | Status: DC
  Administered 2021-11-21 – 2021-11-24 (×4): 6 via TOPICAL

## 2021-11-21 MED ORDER — DEXTROSE 50 % IV SOLN
12.5000 g | INTRAVENOUS | Status: AC
  Administered 2021-11-21: 12.5 g via INTRAVENOUS

## 2021-11-21 MED ORDER — HEPARIN SODIUM (PORCINE) 5000 UNIT/ML IJ SOLN
5000.0000 [IU] | Freq: Three times a day (TID) | INTRAMUSCULAR | Status: DC
  Administered 2021-11-21: 5000 [IU] via SUBCUTANEOUS
  Filled 2021-11-21: qty 1

## 2021-11-21 MED ORDER — CINACALCET HCL 30 MG PO TABS
30.0000 mg | ORAL_TABLET | ORAL | Status: DC
  Administered 2021-11-21 – 2021-11-23 (×2): 30 mg via ORAL
  Filled 2021-11-21 (×2): qty 1

## 2021-11-21 MED ORDER — SODIUM CHLORIDE 0.9% FLUSH
3.0000 mL | Freq: Two times a day (BID) | INTRAVENOUS | Status: DC
  Administered 2021-11-21: 3 mL via INTRAVENOUS

## 2021-11-21 SURGICAL SUPPLY — 11 items
CATH INFINITI 5FR MULTPACK ANG (CATHETERS) ×1 IMPLANT
CATH SWAN GANZ 7F STRAIGHT (CATHETERS) ×1 IMPLANT
KIT HEART LEFT (KITS) ×2 IMPLANT
PACK CARDIAC CATHETERIZATION (CUSTOM PROCEDURE TRAY) ×2 IMPLANT
SHEATH PINNACLE 5F 10CM (SHEATH) ×1 IMPLANT
SHEATH PINNACLE 7F 10CM (SHEATH) ×1 IMPLANT
SYR MEDRAD MARK 7 150ML (SYRINGE) ×2 IMPLANT
TRANSDUCER W/STOPCOCK (MISCELLANEOUS) ×2 IMPLANT
TUBING CIL FLEX 10 FLL-RA (TUBING) ×2 IMPLANT
WIRE EMERALD 3MM-J .025X260CM (WIRE) ×1 IMPLANT
WIRE EMERALD 3MM-J .035X150CM (WIRE) ×1 IMPLANT

## 2021-11-21 NOTE — Progress Notes (Signed)
Patient transferred from [facility] last night for HD and heart cath this morning. Orders in for heart cath but not for HD.  Contacted Dr. Carolin Sicks for orders and orders received. Plan to contact Dialysis dept after 0600 to try to get him in as early as possible this AM. ?

## 2021-11-21 NOTE — Consult Note (Addendum)
ESRD Consult Note ? ?Requesting provider: Eulogio Bear ?Service requesting consult: Hospitalist ?Reason for consult: ESRD, provision of dialysis ?Indication for acute dialysis?: End Stage Renal Disease ? ?Outpatient dialysis unit: [facility] ?Outpatient dialysis schedule: TTS ?Script: 2K, 2.5 Ca, EDW 106, DFR 500, 4.5hrs, NIPRO, AVF, heparin 3500 units loading then 2000 mid treatment, sensipar '30mg'$  TTS, calcitriol 0.66mg TTS ? ? ? ?Assessment/Recommendations: Clayton SPIEKERis a/an 71y.o. male with a past medical history notable for ESRD on HD admitted with syncope.  ? ?# ESRD: HD today then continue TTS schedule ?# Volume/ hypertension: EDW 106kg, total body volume overload chronic. F/u RHC. Volume removal as tolerated ?# Anemia of Chronic Kidney Disease: Hemoglobin 10.8. Currently receiving no esa.  ?# Secondary Hyperparathyroidism/Hyperphosphatemia: Continue home sensipar and calcitrio..Marland KitchenPhos 8.9. Continue home binders  ?# Vascular access: AVF with no issues ?# Syncope: Concerning for cardiac etiology with newly low EF. [facility] ? ?# Additional recommendations: ?- Dose all meds for creatinine clearance < 10 ml/min  ?- Unless absolutely necessary, no MRIs with gadolinium.  ?- Implement save arm precautions.  Prefer needle sticks in the dorsum of the hands or wrists.  No blood pressure measurements in arm. ?- If blood transfusion is requested during hemodialysis sessions, please alert uKoreaprior to the session.  ?- Use synthetic opioids (Fentanyl/Dilaudid) if needed ? ?Recommendations were discussed with the primary team. ? ? ?History of Present Illness: Clayton DUDENHOEFFERis a/an 71y.o. male with a past medical history of ESRD who presents with Syncope. ? ?[facility]  These episodes happen fairly close to one another.  He did lose consciousness and was having dizziness when he tried to stand up.  He denies any chest pain,  shortness of breath, palpitations, orthopnea.  He thinks that they took too much fluid off on dialysis.  He was brought to the hospital and TTE pair demonstrated an ejection fraction of 30-35% and was previously normal.  There was significant concern that his syncope was related to cardiac etiology based on previous studies.  He also has had severe aortic stenosis.  [facility] ? ?Saw the patient on dialysis today and he was tolerating it well.  He states that dialysis in the outpatient setting is, without significant issues.  He has chronic left lower extremity edema and states it is about the same as it typically is. ? ? ?Medications:  ?Current Facility-Administered Medications  ?Medication Dose Route Frequency Provider Last Rate Last Admin  ? 0.9 %  sodium chloride infusion  250 mL Intravenous PRN Dunn, Dayna N, PA-C      ? 0.9 %  sodium chloride infusion   Intravenous Continuous Dunn, Dayna N, PA-C      ? 0.9 %  sodium chloride infusion  100 mL Intravenous PRN BRosita Fire MD      ? 0.9 %  sodium chloride infusion  100 mL Intravenous PRN BRosita Fire MD      ? acetaminophen (TYLENOL) tablet 650 mg  650 mg Oral Q6H PRN Zierle-Ghosh, Asia B, DO      ? Or  ? acetaminophen (TYLENOL) suppository 650 mg  650 mg Rectal Q6H PRN Zierle-Ghosh, Asia B, DO      ? albuterol (PROVENTIL) (2.5 MG/3ML) 0.083% nebulizer solution 3 mL  3 mL Inhalation Q6H PRN Zierle-Ghosh, Asia B, DO      ?  ALPRAZolam Duanne Moron) tablet 0.5 mg  0.5 mg Oral BID PRN Zierle-Ghosh, Asia B, DO   0.5 mg at 11/21/21 0920  ? alteplase (CATHFLO ACTIVASE) injection 2 mg  2 mg Intracatheter Once PRN Rosita Fire, MD      ? atorvastatin (LIPITOR) tablet 80 mg  80 mg Oral Daily Zierle-Ghosh, Asia B, DO   80 mg at 11/20/21 0842  ? azithromycin (ZITHROMAX) 500 mg in sodium chloride 0.9 % 250 mL IVPB  500 mg Intravenous Q24H Zierle-Ghosh, Asia B,  DO 250 mL/hr at 11/20/21 1551 500 mg at 11/20/21 1551  ? calcium acetate (PHOSLO) capsule 1,334 mg  1,334 mg Oral TID with meals Zierle-Ghosh, Asia B, DO   1,334 mg at 11/20/21 1242  ? calcium acetate (PHOSLO) capsule 667 mg  667 mg Oral PRN Zierle-Ghosh, Asia B, DO      ? cefTRIAXone (ROCEPHIN) 2 g in sodium chloride 0.9 % 100 mL IVPB  2 g Intravenous Q24H Zierle-Ghosh, Asia B, DO   Stopped at 11/20/21 1539  ? Chlorhexidine Gluconate Cloth 2 % PADS 6 each  6 each Topical Q0600 Rosita Fire, MD   6 each at 11/21/21 [id number]  ? clopidogrel (PLAVIX) tablet 75 mg  75 mg Oral Daily Zierle-Ghosh, Asia B, DO   75 mg at 11/21/21 0641  ? diphenhydrAMINE (BENADRYL) capsule 25 mg  25 mg Oral Daily PRN Zierle-Ghosh, Asia B, DO      ? heparin injection 1,000 Units  1,000 Units Dialysis PRN Rosita Fire, MD      ? heparin injection 5,000 Units  5,000 Units Subcutaneous Q8H Zierle-Ghosh, Asia B, DO   5,000 Units at 11/21/21 0641  ? hydrocortisone cream 1 %   Topical BID PRN Bernadette Hoit, DO   Given at 11/21/21 0004  ? insulin aspart (novoLOG) injection 0-15 Units  0-15 Units Subcutaneous TID WC Zierle-Ghosh, Asia B, DO   5 Units at 11/20/21 1729  ? insulin aspart (novoLOG) injection 0-5 Units  0-5 Units Subcutaneous QHS Zierle-Ghosh, Asia B, DO      ? levothyroxine (SYNTHROID) tablet 150 mcg  150 mcg Oral Q0600 Zierle-Ghosh, Asia B, DO   150 mcg at 11/21/21 0641  ? lidocaine (PF) (XYLOCAINE) 1 % injection 5 mL  5 mL Intradermal PRN Rosita Fire, MD      ? lidocaine-prilocaine (EMLA) cream 1 application.  1 application. Topical PRN Rosita Fire, MD      ? midodrine (PROAMATINE) tablet 10 mg  10 mg Oral TID WC Zierle-Ghosh, Asia B, DO   10 mg at 11/20/21 1728  ? multivitamin (RENA-VIT) tablet 1 tablet  1 tablet Oral Daily Zierle-Ghosh, Asia B, DO   1 tablet at 11/20/21 0842  ? ondansetron (ZOFRAN) tablet 4 mg  4 mg Oral Q6H PRN Zierle-Ghosh, Asia B, DO      ? Or  ? ondansetron (ZOFRAN) injection  4 mg  4 mg Intravenous Q6H PRN Zierle-Ghosh, Asia B, DO      ? oxyCODONE (Oxy IR/ROXICODONE) immediate release tablet 5 mg  5 mg Oral Q4H PRN Zierle-Ghosh, Asia B, DO   5 mg at 11/21/21 8242  ? pantoprazole (PROTONIX) EC tablet 40 mg  40 mg Oral Daily PRN Zierle-Ghosh, Asia B, DO   40 mg at 11/20/21 2124  ? pentafluoroprop-tetrafluoroeth (GEBAUERS) aerosol 1 application.  1 application. Topical PRN Rosita Fire, MD      ? pregabalin (LYRICA) capsule 150 mg  150 mg Oral TID Zierle-Ghosh, [place] B,  DO   150 mg at 11/20/21 2145  ? senna (SENOKOT) tablet 8.6 mg  1 tablet Oral PRN Zierle-Ghosh, Asia B, DO      ? sodium chloride flush (NS) 0.9 % injection 3 mL  3 mL Intravenous Q12H Zierle-Ghosh, Asia B, DO   3 mL at 11/20/21 0844  ? sodium chloride flush (NS) 0.9 % injection 3 mL  3 mL Intravenous Q12H Dunn, Dayna N, PA-C   3 mL at 11/20/21 2129  ? sodium chloride flush (NS) 0.9 % injection 3 mL  3 mL Intravenous PRN Charlie Pitter, PA-C      ?  ? ?ALLERGIES ?Ace inhibitors, Clonidine, Morphine and related, and Oxytocin ? ?MEDICAL HISTORY ?Past Medical History:  ?Diagnosis Date  ? Anemia   ? Anxiety   ? Arthritis   ? CAD (coronary artery disease)   ? Myoview August 2021 with evidence of large inferior scar but no active ischemia  ? COPD (chronic obstructive pulmonary disease) ([name])   ? Depression   ? ESRD on hemodialysis ([place])   ? Essential hypertension   ? GERD (gastroesophageal reflux disease)   ? H/O hiatal hernia   ? Headache(784.0)   ? History of kidney stones   ? History of pneumonia   ? Hypothyroidism   ? Neuropathy   ? Non-healing wound of amputation stump ([place])   ? Right  ? Peripheral vascular disease ([place])   ? PTSD (post-traumatic stress disorder)   ? Type 2 diabetes mellitus ([place])   ?  ? ?SOCIAL HISTORY ?Social History  ? ?Socioeconomic History  ? Marital status: Married  ?  Spouse name: [name]  ? Number of children: Not on file  ? Years of education: Not on file  ? Highest education level: Not on file   ?Occupational History  ? Occupation: retired  ?Tobacco Use  ? Smoking status: Every Day  ?  Packs/day: 0.50  ?  Years: 18.00  ?  Pack years: 9.00  ?  Types: Cigarettes  ?  Last attempt to quit: 06/27/2020

## 2021-11-21 NOTE — Hospital Course (Signed)
[name] is a 71 y.o. male with a history of ESRD (HD on TTS), COPD, CAD, HTN, PVD s/p R AKA, chronic LLE wound, T2DM who presented to the ED 4/9 after 2 separate episodes of syncope. He reports no prodrome, just waking up on the floor. EMS was reactivated and transported to the ED where BP was noted to be 58/40, 48/28, associated with unresponsiveness. A total of 1.5L IVF was given with improvement in blood pressure and mentation. He was brought in for observation for syncope, given antibiotics for a possible pneumonia. Telemetry revealed short NSVT, some 1st degree AVB. Echo revealed severe aortic stenosis (previously mild in 7847), and LV systolic dysfunction (LVEF 30-35%, new) with global hypokinesis and dilated/plethoric IVC. Cardiology was consulted, recommending transfer to [facility] for cardiac catheterization.  ?

## 2021-11-21 NOTE — Plan of Care (Signed)

## 2021-11-21 NOTE — Progress Notes (Signed)
Pt received from cath lab. VSS. R groin level 0. Educated pt on 4 hour bed rest, call light in reach. ? ?Clyde Canterbury, RN ? ?

## 2021-11-21 NOTE — Procedures (Signed)
I was present at this dialysis session. I have reviewed the session itself and made appropriate changes.  ? ?Filed Weights  ? 11/19/21 1314 11/20/21 2253 11/21/21 0417  ?Weight: 130 kg 108.4 kg 109.1 kg  ? ? ?Recent Labs  ?Lab 11/21/21 ?0865  ?NA 139  ?K 5.1  ?CL 105  ?CO2 20*  ?GLUCOSE 92  ?BUN 57*  ?CREATININE 6.58*  ?CALCIUM 9.4  ?PHOS 8.9*  ? ? ?Recent Labs  ?Lab 11/19/21 ?1401 11/20/21 ?7846 11/21/21 ?9629  ?WBC 5.6 6.1 8.2  ?NEUTROABS 3.9 3.6  --   ?HGB 8.7* 10.2* 10.8*  ?HCT 27.4* 32.8* 32.6*  ?MCV 101.5* 100.9* 98.5  ?PLT 155 140* 166  ? ? ?Scheduled Meds: ? atorvastatin  80 mg Oral Daily  ? calcitRIOL  0.25 mcg Oral Q T,Th,Sat-1800  ? calcium acetate  1,334 mg Oral TID with meals  ? Chlorhexidine Gluconate Cloth  6 each Topical Q0600  ? cinacalcet  30 mg Oral Q T,Th,Sat-1800  ? clopidogrel  75 mg Oral Daily  ? heparin  5,000 Units Subcutaneous Q8H  ? insulin aspart  0-15 Units Subcutaneous TID WC  ? insulin aspart  0-5 Units Subcutaneous QHS  ? levothyroxine  150 mcg Oral Q0600  ? midodrine  10 mg Oral TID WC  ? multivitamin  1 tablet Oral Daily  ? pregabalin  150 mg Oral TID  ? sodium chloride flush  3 mL Intravenous Q12H  ? sodium chloride flush  3 mL Intravenous Q12H  ? ?Continuous Infusions: ? sodium chloride    ? sodium chloride    ? sodium chloride    ? sodium chloride    ? azithromycin 500 mg (11/20/21 1551)  ? cefTRIAXone (ROCEPHIN)  IV Stopped (11/20/21 1539)  ? ?PRN Meds:.sodium chloride, sodium chloride, sodium chloride, acetaminophen **OR** acetaminophen, albuterol, ALPRAZolam, alteplase, calcium acetate, diphenhydrAMINE, heparin, hydrocortisone cream, lidocaine (PF), lidocaine-prilocaine, ondansetron **OR** ondansetron (ZOFRAN) IV, oxyCODONE, pantoprazole, pentafluoroprop-tetrafluoroeth, senna, sodium chloride flush   ?Santiago Bumpers,  MD ?11/21/2021, 11:10 AM ? ? ?

## 2021-11-21 NOTE — Progress Notes (Signed)
Hypoglycemic Event ? ?CBG: 65 ? ?Treatment: D50 25 mL (12.5 gm) ? ?Symptoms: Shaky ? ?Follow-up CBG: Time:1300 CBG Result:90 ? ?Possible Reasons for Event: Other: NPO for heart cath ? ? ?Clyde Canterbury, RN ? ? ?

## 2021-11-21 NOTE — Progress Notes (Signed)
?Progress Note ? ?Patient: [name] LFY:[id number] DOB: 08/11/1951  ?DOA: 11/19/2021  DOS: 11/21/2021  ?  ?Brief hospital course: ?[name] is a 72 y.o. male with a history of ESRD (HD on TTS), COPD, CAD, HTN, PVD s/p R AKA, chronic LLE wound, T2DM who presented to the ED 4/9 after 2 separate episodes of syncope. He reports no prodrome, just waking up on the floor. EMS was reactivated and transported to the ED where BP was noted to be 58/40, 48/28, associated with unresponsiveness. A total of 1.5L IVF was given with improvement in blood pressure and mentation. He was brought in for observation for syncope, given antibiotics for a possible pneumonia. Telemetry revealed short NSVT, some 1st degree AVB. Echo revealed severe aortic stenosis (previously mild in 8527), and LV systolic dysfunction (LVEF 30-35%, new) with global hypokinesis and dilated/plethoric IVC. Cardiology was consulted, recommending transfer to [facility] for cardiac catheterization.   ? ? ?Assessment and Plan: ?* Syncope ?Cardiogenic sounding story and echo confirms causative valvular disease, depressed LVEF. Carotid U/S without severe stenosis.  ?- See plan for aortic stenosis.  ?- Fall precautions.  ?- Continue cardiac monitoring. ? ?HFrEF (heart failure with reduced ejection fraction) ([place]) ?LV globally hypokinetic. Given hx CAD but no cardiac catheterization, suspect this would be helpful.  ?- Cardiology consulted, will pursue cath tentatively 4/11. ?- GDMT to be initiated once hemodynamically stabilized. ? ?Aortic stenosis ?Severe by echo 11/20/2021. Previously said to be mild.  ?- Not likely a candidate for open heart surgery. Per cards ? ?GERD (gastroesophageal reflux disease) ?- Continue Protonix ? ?Sepsis ([place]) ?Suspected with subtle CXR findings, hypotension, tachypnea, elevated lactic acid. There is also a possibility that derangements are hemodynamically medicated primarily and not due to infection. ?- Continue empiric abx. Would  hopefully target only 5 days of Tx  ? ? ?Hypotension ?Improved. Suspect dialysis-related volume removal with decreased cardiac output ?- Hold home lisinopril, lasix, continue midodrine. ? ?Hypothyroidism ?- Continue Synthroid ? ?DMII (diabetes mellitus, type 2) ([place]) ?- Toujeo 10-15 units at home, given 5u here, hold for now with NPO status tomorrow.  ?- Continue SSI but decrease dose due to hypoglycemia ? ?Peripheral vascular disease, unspecified ([place]) ?- s/p right AKA. Previously followed by Dr. [name]. ?- Continue statin ? ?ESRD (end stage renal disease) ([place]) ?Continue routine HD TTS thru [facility] AVF (last 4/8). ?-HD: 4/11 ?-nephrology consult ? ?Obesity: Estimated body mass index is 28.18 kg/m? as calculated from the following: ?  Height as of this encounter: '6\' 4"'$  (1.93 m). ?  Weight as of this encounter: 105 kg. ? ?Subjective:  ?Hungry== waiting on heart cath  ? ?Objective: ?Vitals:  ? 11/21/21 1049 11/21/21 1100 11/21/21 1126 11/21/21 1200  ?BP: (!) 98/59 (!) 95/57 116/73 131/72  ?Pulse: 60 (!) 59 63 64  ?Resp:   16 13  ?Temp:   97.6 ?F (36.4 ?C) 97.7 ?F (36.5 ?C)  ?TempSrc:   Oral Oral  ?SpO2:   100% 100%  ?Weight:   105 kg   ?Height:      ? ? ?General: Appearance:     ?Overweight male in no acute distress  ?Eyes:    PERRL, conjunctiva/corneas clear, EOM's intact       ?Lungs:     Clear to auscultation bilaterally, respirations unlabored  ?   ?MS:    Right LE amputation noted.   ?Neurologic:   Awake, alert  ?  ? ?Data Personally reviewed: ?CBC: ?Recent Labs  ?Lab 11/19/21 ?1401 11/20/21 ?  2334 11/21/21 ?3568  ?WBC 5.6 6.1 8.2  ?NEUTROABS 3.9 3.6  --   ?HGB 8.7* 10.2* 10.8*  ?HCT 27.4* 32.8* 32.6*  ?MCV 101.5* 100.9* 98.5  ?PLT 155 140* 166  ? ?Basic Metabolic Panel: ?Recent Labs  ?Lab 11/19/21 ?1401 11/20/21 ?6168 11/21/21 ?0734  ?NA 139 138 139  ?K 4.3 4.9 5.1  ?CL 102 102 105  ?CO2 23 23 20*  ?GLUCOSE 135* 138* 92  ?BUN 38* 45* 57*  ?CREATININE 4.39* 5.29* 6.58*  ?CALCIUM 8.9 9.4 9.4  ?MG  --  1.8  --   ?PHOS   --   --  8.9*  ? ?GFR: ?Estimated Creatinine Clearance: 13.7 mL/min (A) (by C-G formula based on SCr of 6.58 mg/dL (H)). ?Liver Function Tests: ?Recent Labs  ?Lab 11/19/21 ?1401 11/20/21 ?3729 11/21/21 ?0734  ?AST 74* 84*  --   ?ALT 47* 50*  --   ?ALKPHOS 188* 185*  --   ?BILITOT 0.7 0.8  --   ?PROT 5.8* 6.1*  --   ?ALBUMIN 2.7* 2.8* 2.7*  ? ?HbA1C: ?Recent Labs  ?  11/20/21 ?0211  ?HGBA1C 6.0*  ? ?HEAD CT 1. No acute intracranial abnormalities. CERVICAL CT 1. No fracture or acute finding. 2. Bilateral pleural effusions. ? ?CAROTID US ?1. Right carotid artery system: Less than 50% stenosis secondary to mild atherosclerotic plaque formation in the proximal internal carotid artery. 2. Left carotid artery system: Patent without significant atherosclerotic plaque formation. 3.  Vertebral artery system: Patent with antegrade flow bilaterally. Ruthann Cancer, MD [facility] Electronically Signed   By: Ruthann Cancer M.D.   On: 11/20/2021 15:32  ? ?ECHOCARDIOGRAM COMPLETE ?1. Left ventricular ejection fraction, by estimation, is 30 to 35%. The left ventricle has moderately decreased function. The left ventricle demonstrates global hypokinesis. The left ventricular internal cavity size was mildly dilated. There is moderate  left ventricular hypertrophy. Left ventricular diastolic parameters are indeterminate.  2. Right ventricular systolic function is moderately reduced. The right ventricular size is mildly enlarged. There is mildly elevated pulmonary artery systolic pressure.  3. Left atrial size was moderately dilated.  4. The mitral valve is degenerative. Mild mitral valve regurgitation. No evidence of mitral stenosis. Moderate mitral annular calcification.  5. Severe low flow AS with moderately decreased EF. AS/decreased EF new since 2021 . The aortic valve was not well visualized. There is severe calcifcation of the aortic valve. There is severe thickening of the  aortic valve. Aortic valve regurgitation is trivial. Severe aortic valve stenosis.  6. The inferior vena cava is dilated in size with <50% respiratory variability, suggesting right atrial pressure of 15 mmHg.  ? ? ? ?Disposition: ?Status is: Inpatient ?Remains inpatient appropriate because: await heart cath ?Planned Discharge Destination: Home ? ? ?Geradine Girt, DO ?11/21/2021 1:40 PM ?Page by [email or web address]  ?

## 2021-11-21 NOTE — Interval H&P Note (Signed)
History and Physical Interval Note: ? ?11/21/2021 ?10:48 AM ? ?[name]  has presented today for surgery, with the diagnosis of aortic stenosis - syncope - cardiomyopathy.  The various methods of treatment have been discussed with the patient and family. After consideration of risks, benefits and other options for treatment, the patient has consented to  Procedure(s): ?RIGHT/LEFT HEART CATH AND CORONARY ANGIOGRAPHY (N/A) as a surgical intervention.  The patient's history has been reviewed, patient examined, no change in status, stable for surgery.  I have reviewed the patient's chart and labs.  Questions were answered to the patient's satisfaction.   ? ? ? ?[name] ? ? ?

## 2021-11-21 NOTE — Progress Notes (Signed)
Inpatient Diabetes Program Recommendations ? ?AACE/ADA: New Consensus Statement on Inpatient Glycemic Control (2015) ? ?Target Ranges:  Prepandial:   less than 140 mg/dL ?     Peak postprandial:   less than 180 mg/dL (1-2 hours) ?     Critically ill patients:  140 - 180 mg/dL  ? ?Lab Results  ?Component Value Date  ? GLUCAP 65 (L) 11/21/2021  ? HGBA1C 6.0 (H) 11/20/2021  ? ? ?Review of Glycemic Control ? Latest Reference Range & Units 11/20/21 17:07 11/20/21 21:12 11/21/21 06:42 11/21/21 12:19  ?Glucose-Capillary 70 - 99 mg/dL 221 (H) ?Novolog 5 units 104 (H) 91 65 (L)  ?(H): Data is abnormally high ?(L): Data is abnormally low ? ?Diabetes history: DM2 ?Outpatient Diabetes medications: Toujeo 10-15 units hs prn if CBG >130 ?Current orders for Inpatient glycemic control: Novolog 0-15 units tid, 0-5 units hs ? ?Inpatient Diabetes Program Recommendations:   ?Hypoglycemia post Novolog correction given last pm. ?Please consider: ?-Decrease Novolog correction to 0-6 units tid, 0-5 units hs ? ?Thank you, ?Nani Gasser Curlie Sittner, RN, MSN, CDE  ?Diabetes Coordinator ?Inpatient Glycemic Control Team ?Team Pager [phone] (8am-5pm) ?11/21/2021 12:34 PM ? ? ? ? ?

## 2021-11-21 NOTE — Interval H&P Note (Signed)
History and Physical Interval Note: ? ?11/21/2021 ?3:40 PM ? ?[name]  has presented today for surgery, with the diagnosis of aortic stenosis - syncope - cardiomyopathy.  The various methods of treatment have been discussed with the patient and family. After consideration of risks, benefits and other options for treatment, the patient has consented to  Procedure(s): ?RIGHT/LEFT HEART CATH AND CORONARY ANGIOGRAPHY (N/A) as a surgical intervention.  The patient's history has been reviewed, patient examined, no change in status, stable for surgery.  I have reviewed the patient's chart and labs.  Questions were answered to the patient's satisfaction.   ? ? ?Collier Salina [facility] ?11/21/2021 ?3:40 PM ? ? ? ?

## 2021-11-21 NOTE — Progress Notes (Signed)
? ?Progress Note ? ?Patient Name: [name] ?Date of Encounter: 11/21/2021 ? ?[facility] Cardiologist: Rozann Lesches, MD  ? ?Subjective  ? ?Currently no chest pain or shortness of breath. ? ?Inpatient Medications  ?  ?Scheduled Meds: ? atorvastatin  80 mg Oral Daily  ? calcium acetate  1,334 mg Oral TID with meals  ? Chlorhexidine Gluconate Cloth  6 each Topical Q0600  ? clopidogrel  75 mg Oral Daily  ? heparin  5,000 Units Subcutaneous Q8H  ? insulin aspart  0-15 Units Subcutaneous TID WC  ? insulin aspart  0-5 Units Subcutaneous QHS  ? levothyroxine  150 mcg Oral Q0600  ? midodrine  10 mg Oral TID WC  ? multivitamin  1 tablet Oral Daily  ? pregabalin  150 mg Oral TID  ? sodium chloride flush  3 mL Intravenous Q12H  ? sodium chloride flush  3 mL Intravenous Q12H  ? ?Continuous Infusions: ? sodium chloride    ? sodium chloride    ? sodium chloride    ? sodium chloride    ? azithromycin 500 mg (11/20/21 1551)  ? cefTRIAXone (ROCEPHIN)  IV Stopped (11/20/21 1539)  ? ?PRN Meds: ?sodium chloride, sodium chloride, sodium chloride, acetaminophen **OR** acetaminophen, albuterol, ALPRAZolam, alteplase, calcium acetate, diphenhydrAMINE, heparin, hydrocortisone cream, lidocaine (PF), lidocaine-prilocaine, ondansetron **OR** ondansetron (ZOFRAN) IV, oxyCODONE, pantoprazole, pentafluoroprop-tetrafluoroeth, senna, sodium chloride flush  ? ?Vital Signs  ?  ?Vitals:  ? 11/21/21 0830 11/21/21 0900 11/21/21 0930 11/21/21 1000  ?BP: 111/64 114/62 (!) 103/57 101/63  ?Pulse: (!) 59 (!) 56 (!) 56 62  ?Resp: '16 12 12   '$ ?Temp:      ?TempSrc:      ?SpO2:      ?Weight:      ?Height:      ? ? ?Intake/Output Summary (Last 24 hours) at 11/21/2021 1014 ?Last data filed at 11/21/2021 0600 ?Gross per 24 hour  ?Intake 1319.77 ml  ?Output --  ?Net 1319.77 ml  ? ? ?  11/21/2021  ?  4:17 AM 11/20/2021  ? 10:53 PM 11/19/2021  ?  1:14 PM  ?Last 3 Weights  ?Weight (lbs) 240 lb 8.4 oz 238 lb 15.7 oz 286 lb 9.6 oz  ?Weight (kg) 109.1 kg 108.4 kg 130  kg  ?   ?I/O since admission +209.8 ? ?Telemetry  ?  ?Sinus rhythm- Personally Reviewed ? ?ECG  ?  ?November 20, 2021 ECG (independently read by me): Probable sinus rhythm at 95 bpm with right bundle branch block and left anterior hemiblock ? ?Physical Exam  ? ? ?BP 131/72 (BP Location: Right Arm)   Pulse 64   Temp 97.7 ?F (36.5 ?C) (Oral)   Resp 13   Ht '6\' 4"'$  (1.93 m)   Wt 105 kg   SpO2 100%   BMI 28.18 kg/m?  ?General: Alert, oriented, no distress.  Bearded ?Skin: normal turgor, no rashes, warm and dry; bruise on scalp ?HEENT: Normocephalic, atraumatic. Pupils equal round and reactive to light; sclera anicteric; extraocular muscles intact;  ?Nose without nasal septal hypertrophy ?Mouth/Parynx benign; Mallinpatti scale 3 ?Neck: No JVD, status post left CEA; transmitted murmur ?Lungs: Decreased breath sounds with expiratory wheezing ?Chest wall: without tenderness to palpitation ?Heart: PMI not displaced, RRR, s1 s2 normal, 2/6 systolic murmur, no diastolic murmur, no rubs, gallops, thrills, or heaves ?Abdomen: soft, nontender; no hepatosplenomehaly, BS+; abdominal aorta nontender and not dilated by palpation. ?Back: no CVA tenderness ?Pulses 2+ left upper extremity fistula with thrill and old radial  fistula ?Musculoskeletal: full range of motion, normal strength, no joint deformities ?Extremities: Right AKA; 1-2+ left lower extremity edema; Homan's sign negative  ?Neurologic: grossly nonfocal; Cranial nerves grossly wnl ?Psychologic: Normal mood and affect ? ? ? ?Labs  ?  ?High Sensitivity Troponin:  No results for input(s): TROPONINIHS in the last 720 hours.   ?Chemistry ?Recent Labs  ?Lab 11/19/21 ?1401 11/20/21 ?4097 11/21/21 ?0734  ?NA 139 138 139  ?K 4.3 4.9 5.1  ?CL 102 102 105  ?CO2 23 23 20*  ?GLUCOSE 135* 138* 92  ?BUN 38* 45* 57*  ?CREATININE 4.39* 5.29* 6.58*  ?CALCIUM 8.9 9.4 9.4  ?MG  --  1.8  --   ?PROT 5.8* 6.1*  --   ?ALBUMIN 2.7* 2.8* 2.7*  ?AST 74* 84*  --   ?ALT 47* 50*  --   ?ALKPHOS 188*  185*  --   ?BILITOT 0.7 0.8  --   ?GFRNONAA 14* 11* 8*  ?ANIONGAP '14 13 14  '$ ?  ?Lipids No results for input(s): CHOL, TRIG, HDL, LABVLDL, LDLCALC, CHOLHDL in the last 168 hours.  ?Hematology ?Recent Labs  ?Lab 11/19/21 ?1401 11/20/21 ?3532 11/21/21 ?9924  ?WBC 5.6 6.1 8.2  ?RBC 2.70* 3.25* 3.31*  ?HGB 8.7* 10.2* 10.8*  ?HCT 27.4* 32.8* 32.6*  ?MCV 101.5* 100.9* 98.5  ?MCH 32.2 31.4 32.6  ?MCHC 31.8 31.1 33.1  ?RDW 18.5* 18.3* 18.3*  ?PLT 155 140* 166  ? ?Thyroid No results for input(s): TSH, FREET4 in the last 168 hours.  ?BNPNo results for input(s): BNP, PROBNP in the last 168 hours.  ?DDimer No results for input(s): DDIMER in the last 168 hours.  ? ?Radiology  ?  ?CT Head Wo Contrast ? ?Result Date: 11/19/2021 ?CLINICAL DATA:  Syncopal episode.  Patient fell over onto his head. EXAM: CT HEAD WITHOUT CONTRAST CT CERVICAL SPINE WITHOUT CONTRAST TECHNIQUE: Multidetector CT imaging of the head and cervical spine was performed following the standard protocol without intravenous contrast. Multiplanar CT image reconstructions of the cervical spine were also generated. RADIATION DOSE REDUCTION: This exam was performed according to the departmental dose-optimization program which includes automated exposure control, adjustment of the mA and/or kV according to patient size and/or use of iterative reconstruction technique. COMPARISON:  12/02/2015. FINDINGS: CT HEAD FINDINGS Brain: No evidence of acute infarction, hemorrhage, hydrocephalus, extra-axial collection or mass lesion/mass effect. Vascular: No hyperdense vessel or unexpected calcification. Skull: Normal. Negative for fracture or focal lesion. Sinuses/Orbits: Globes and orbits are unremarkable. Sinuses are clear. Other: None. CT CERVICAL SPINE FINDINGS Alignment: Normal. Skull base and vertebrae: No acute fracture. No primary bone lesion or focal pathologic process. Soft tissues and spinal canal: No prevertebral fluid or swelling. No visible canal hematoma. Disc  levels: Mild disc bulging at C3-C4 and C6-C7. No evidence of a disc herniation. No significant stenosis. Upper chest: Bilateral pleural effusions. Mild interstitial thickening in the lung apices. Other: None. IMPRESSION: HEAD CT 1. No acute intracranial abnormalities. CERVICAL CT 1. No fracture or acute finding. 2. Bilateral pleural effusions. Electronically Signed   By: Lajean Manes M.D.   On: 11/19/2021 16:05  ? ?CT Cervical Spine Wo Contrast ? ?Result Date: 11/19/2021 ?CLINICAL DATA:  Syncopal episode.  Patient fell over onto his head. EXAM: CT HEAD WITHOUT CONTRAST CT CERVICAL SPINE WITHOUT CONTRAST TECHNIQUE: Multidetector CT imaging of the head and cervical spine was performed following the standard protocol without intravenous contrast. Multiplanar CT image reconstructions of the cervical spine were also generated. RADIATION DOSE REDUCTION: This exam  was performed according to the departmental dose-optimization program which includes automated exposure control, adjustment of the mA and/or kV according to patient size and/or use of iterative reconstruction technique. COMPARISON:  12/02/2015. FINDINGS: CT HEAD FINDINGS Brain: No evidence of acute infarction, hemorrhage, hydrocephalus, extra-axial collection or mass lesion/mass effect. Vascular: No hyperdense vessel or unexpected calcification. Skull: Normal. Negative for fracture or focal lesion. Sinuses/Orbits: Globes and orbits are unremarkable. Sinuses are clear. Other: None. CT CERVICAL SPINE FINDINGS Alignment: Normal. Skull base and vertebrae: No acute fracture. No primary bone lesion or focal pathologic process. Soft tissues and spinal canal: No prevertebral fluid or swelling. No visible canal hematoma. Disc levels: Mild disc bulging at C3-C4 and C6-C7. No evidence of a disc herniation. No significant stenosis. Upper chest: Bilateral pleural effusions. Mild interstitial thickening in the lung apices. Other: None. IMPRESSION: HEAD CT 1. No acute  intracranial abnormalities. CERVICAL CT 1. No fracture or acute finding. 2. Bilateral pleural effusions. Electronically Signed   By: Lajean Manes M.D.   On: 11/19/2021 16:05  ? ?US Carotid Bilateral ? ?Result Date: 4/10/202

## 2021-11-21 NOTE — Progress Notes (Signed)
Brief nephrology note: ?71 y/o M ESRD transferred from [facility] for syncope, CHF and plan for cath around noon. Received IVF at [facility] and concern for fluid overload. We'll plan for HD in am. HD ordered. ?Full consult to follow. ? ?Dr. Carolin Sicks. ?

## 2021-11-21 NOTE — Progress Notes (Signed)
Pt received from HD. VSS.CBG 65 and treated with dextrose. Call light in reach. ? ?Clyde Canterbury, RN ? ?

## 2021-11-21 NOTE — Progress Notes (Signed)
Hypoglycemic Event ? ?CBG: 81 ? ?Treatment: 4 oz juice/soda, dinner tray ? ?Symptoms: Shaky ? ?Follow-up CBG: Time:1800 CBG Result:81 ? ?Possible Reasons for Event: NPO ? ? ?Clyde Canterbury, RN ? ? ?

## 2021-11-21 NOTE — Progress Notes (Signed)
Site area: rt groin arterial and venous sheaths  ?Site Prior to Removal:  Level 0 ?Pressure Applied For: 20 minutes ?Manual:   yes ?Patient Status During Pull:  stable ?Post Pull Site:  Level 0 ?Post Pull Instructions Given:  yes ?Post Pull Pulses Present: rt AKA ?Dressing Applied:  gauze and tegaderm ?Bedrest begins @ 1705 ?Comments: ?  ?

## 2021-11-22 ENCOUNTER — Encounter (HOSPITAL_COMMUNITY): Payer: Self-pay | Admitting: Cardiology

## 2021-11-22 DIAGNOSIS — N186 End stage renal disease: Secondary | ICD-10-CM | POA: Diagnosis not present

## 2021-11-22 DIAGNOSIS — I739 Peripheral vascular disease, unspecified: Secondary | ICD-10-CM | POA: Diagnosis not present

## 2021-11-22 DIAGNOSIS — R55 Syncope and collapse: Secondary | ICD-10-CM | POA: Diagnosis not present

## 2021-11-22 DIAGNOSIS — I5020 Unspecified systolic (congestive) heart failure: Secondary | ICD-10-CM | POA: Diagnosis not present

## 2021-11-22 DIAGNOSIS — I350 Nonrheumatic aortic (valve) stenosis: Secondary | ICD-10-CM | POA: Diagnosis not present

## 2021-11-22 LAB — GLUCOSE, CAPILLARY
Glucose-Capillary: 143 mg/dL — ABNORMAL HIGH (ref 70–99)
Glucose-Capillary: 187 mg/dL — ABNORMAL HIGH (ref 70–99)
Glucose-Capillary: 162 mg/dL — ABNORMAL HIGH (ref 70–99)
Glucose-Capillary: 129 mg/dL — ABNORMAL HIGH (ref 70–99)

## 2021-11-22 LAB — HEPATITIS B SURFACE ANTIBODY, QUANTITATIVE: Hep B S AB Quant (Post): 47.8 m[IU]/mL (ref >9.9)

## 2021-11-22 MED ORDER — ISOSORBIDE MONONITRATE ER 30 MG PO TB24
15.0000 mg | ORAL_TABLET | Freq: Every day | ORAL | Status: DC
Start: 2021-11-22 — End: 2021-11-24
  Administered 2021-11-22 – 2021-11-24 (×3): 15 mg via ORAL
  Filled 2021-11-22 (×3): qty 1

## 2021-11-22 MED ORDER — METOPROLOL TARTRATE 12.5 MG HALF TABLET
12.5000 mg | ORAL_TABLET | Freq: Two times a day (BID) | ORAL | Status: DC
  Administered 2021-11-22 – 2021-11-24 (×5): 12.5 mg via ORAL
  Filled 2021-11-22 (×5): qty 1

## 2021-11-22 NOTE — Progress Notes (Signed)
? [name]  WIO:[id number] DOB: 04/16/1951 DOA: 11/19/2021 ?PCP: Caryl Bis, MD   ? ?Brief Narrative:  ?71 year old with a history of ESRD on HD TTS, COPD, CAD, HTN, PVD status post right AKA, chronic left lower extremity wound, and DM2 who presented to the ED 4 9 after suffering 2 separate syncopal spells with no prodrome.  He was transported to the [facility], ED via EMS and was found to have a blood pressure 58/40 and was minimally responsive.  Volume resuscitation at 1.5 L resulted in improvement in blood pressure and mentation.  TTE revealed severe aortic stenosis compared to mild aortic stenosis on previous TTE 2021.  LV systolic dysfunction with EF of 30-35% was also appreciated.  The patient was subsequently transferred to [facility] for a planned cardiac cath. ? ?Consultants:  ?Cardiology ?Nephrology ? ?Code Status: FULL CODE ? ?DVT prophylaxis: ?Subcutaneous heparin ? ?Interim Hx: ?Afebrile.  Vital signs stable.  States that he feels somewhat better today but remains dyspneic with significant exertion.  Denies chest pain.  Feels he has a very poor exercise tolerance. ? ?Assessment & Plan: ? ?Severe aortic stenosis with syncope ?Cardiology following -not felt to be a candidate for open heart valve surgery or CABG - medical management being titrated  ? ?CAD ?Cardiac cath 4/11 revealed severe two-vessel obstructive CAD with 90% stenosis of the mid LAD and severe calcification of all vessels -heart team approach to be utilized - not a candidate for intervention or surgery  ? ?Newly diagnosed systolic congestive heart failure ?Volume management per HD - likely due to CAD  ? ?Possible sepsis -possible pneumonia - ruled out clinically  ?Findings likely explained by severe aortic stenosis -discontinue antibiotic and follow clinically ? ?Hypotension ?Likely explained by severe aortic stenosis -patient will be very much dependent upon appropriate volume balance ? ?DM2 ?CBG reasonably controlled at  present -primary goal is to avoid extremes ? ?Hypothyroidism ?Continue usual Synthroid dose ? ?Peripheral vascular disease status post right AKA ?Continue statin ? ?ESRD on HD TTS ?Left upper extremity aVF -nephrology following and attending to dialysis ? ? ?Family Communication: No family present at time of exam ?Disposition: From home -wishes to be discharged home as soon as possible -monitor with medical therapy initiated by cardiology and if tolerating well possible discharge 4/13 ? ?Objective: ?Blood pressure (!) 139/93, pulse 66, temperature 97.9 ?F (36.6 ?C), temperature source Oral, resp. rate 15, height '6\' 4"'$  (1.93 m), weight 105.3 kg, SpO2 99 %. ? ?Intake/Output Summary (Last 24 hours) at 11/22/2021 1603 ?Last data filed at 11/22/2021 1244 ?Gross per 24 hour  ?Intake 600 ml  ?Output --  ?Net 600 ml  ? ?Filed Weights  ? 11/21/21 0417 11/21/21 1126 11/22/21 0345  ?Weight: 109.1 kg 105 kg 105.3 kg  ? ? ?Examination: ?General: No acute respiratory distress at rest ?Lungs: Mild bibasilar crackles with no wheezing ?Cardiovascular: Regular rate without rub with holosystolic murmur ?Abdomen: Nontender, nondistended, soft, bowel sounds positive, no rebound, no ascites, no appreciable mass ?Extremities: 1+ anasarca -left leg wound dressed and dry ? ?CBC: ?Recent Labs  ?Lab 11/19/21 ?1401 11/20/21 ?3845 11/21/21 ?3646 11/21/21 ?1615 11/21/21 ?1622  ?WBC 5.6 6.1 8.2  --   --   ?NEUTROABS 3.9 3.6  --   --   --   ?HGB 8.7* 10.2* 10.8* 10.5* 10.5*  10.5*  ?HCT 27.4* 32.8* 32.6* 31.0* 31.0*  31.0*  ?MCV 101.5* 100.9* 98.5  --   --   ?PLT 155 140*  166  --   --   ? ?Basic Metabolic Panel: ?Recent Labs  ?Lab 11/19/21 ?1401 11/20/21 ?4742 11/21/21 ?5956 11/21/21 ?1615 11/21/21 ?1622  ?NA 139 138 139 138 138  138  ?K 4.3 4.9 5.1 3.9 3.9  3.9  ?CL 102 102 105  --   --   ?CO2 23 23 20*  --   --   ?GLUCOSE 135* 138* 92  --   --   ?BUN 38* 45* 57*  --   --   ?CREATININE 4.39* 5.29* 6.58*  --   --   ?CALCIUM 8.9 9.4 9.4  --   --    ?MG  --  1.8  --   --   --   ?PHOS  --   --  8.9*  --   --   ? ?GFR: ?Estimated Creatinine Clearance: 13.7 mL/min (A) (by C-G formula based on SCr of 6.58 mg/dL (H)). ? ?Liver Function Tests: ?Recent Labs  ?Lab 11/19/21 ?1401 11/20/21 ?3875 11/21/21 ?0734  ?AST 74* 84*  --   ?ALT 47* 50*  --   ?ALKPHOS 188* 185*  --   ?BILITOT 0.7 0.8  --   ?PROT 5.8* 6.1*  --   ?ALBUMIN 2.7* 2.8* 2.7*  ? ? ?HbA1C: ?Hgb A1c MFr Bld  ?Date/Time Value Ref Range Status  ?11/20/2021 05:46 AM 6.0 (H) 4.8 - 5.6 % Final  ?  Comment:  ?  (NOTE) ?Pre diabetes:          5.7%-6.4% ? ?Diabetes:              >6.4% ? ?Glycemic control for   <7.0% ?adults with diabetes ?  ?10/20/2020 03:54 PM 6.2 (H) 4.8 - 5.6 % Final  ?  Comment:  ?  (NOTE) ?Pre diabetes:          5.7%-6.4% ? ?Diabetes:              >6.4% ? ?Glycemic control for   <7.0% ?adults with diabetes ?  ? ? ?CBG: ?Recent Labs  ?Lab 11/21/21 ?1728 11/21/21 ?1817 11/21/21 ?2126 11/22/21 ?6433 11/22/21 ?1114  ?GLUCAP 59* 81 182* 143* 187*  ? ? ?Scheduled Meds: ? atorvastatin  80 mg Oral Daily  ? calcitRIOL  0.25 mcg Oral Q T,Th,Sat-1800  ? calcium acetate  1,334 mg Oral TID with meals  ? Chlorhexidine Gluconate Cloth  6 each Topical Q0600  ? cinacalcet  30 mg Oral Q T,Th,Sat-1800  ? clopidogrel  75 mg Oral Daily  ? heparin  5,000 Units Subcutaneous Q8H  ? insulin aspart  0-5 Units Subcutaneous QHS  ? insulin aspart  0-6 Units Subcutaneous TID WC  ? isosorbide mononitrate  15 mg Oral Daily  ? levothyroxine  150 mcg Oral Q0600  ? metoprolol tartrate  12.5 mg Oral BID  ? midodrine  10 mg Oral TID WC  ? multivitamin  1 tablet Oral Daily  ? pregabalin  150 mg Oral TID  ? sodium chloride flush  3 mL Intravenous Q12H  ? ? ? LOS: 2 days  ? ?Cherene Altes, MD ?[facility] ?Office  [phone] ?Pager - Text Page per Shea Evans ? ?If 7PM-7AM, please contact night-coverage per Amion ?11/22/2021, 4:03 PM ? ? ? ? ?

## 2021-11-22 NOTE — Progress Notes (Signed)
Nephrology Follow-Up Consult note ? ? ?  ?  ?Assessment/Recommendations: [name] is a/an 71 y.o. male with a past medical history notable for ESRD on HD admitted with syncope.  ? ?Outpatient dialysis unit: [facility] ?Outpatient dialysis schedule: TTS ?Script: 2K, 2.5 Ca, EDW 106, DFR 500, 4.5hrs, NIPRO, AVF, heparin 3500 units loading then 2000 mid treatment, sensipar '30mg'$  TTS, calcitriol 0.59mg TTS ?  ?# ESRD: Continue HD per tts schedule ?# Volume/ hypertension: EDW 106kg, total body volume overload chronic. Volume removal as tolerated ?# Anemia of Chronic Kidney Disease: Hemoglobin 10.5. Currently receiving no esa.  ?# Secondary Hyperparathyroidism/Hyperphosphatemia: Continue home sensipar and calcitrio..Marland KitchenPhos 8.9. Continue home binders  ?# Vascular access: AVF with no issues ?# Syncope: Concerning for cardiac etiology with newly low EF. Had LHC and RHC on 4/11 w/ multivessel CAD, decompensated CHF, and pHTN. F/u cardiology recs ?  ?# Additional recommendations: ?- Dose all meds for creatinine clearance < 10 ml/min  ?- Unless absolutely necessary, no MRIs with gadolinium.  ?- Implement save arm precautions.  Prefer needle sticks in the dorsum of the hands or wrists.  No blood pressure measurements in arm. ?- If blood transfusion is requested during hemodialysis sessions, please alert u[place]oreaprior to the session.  ?- Use synthetic opioids (Fentanyl/Dilaudid) if needed ?  ?Recommendations were discussed with the primary team. ?  ? ? ?Recommendations conveyed to primary service.  ? ? ?SReesa Chew?[facility] ?11/22/2021 ?9:38 AM ? ?___________________________________________________________ ? ?CC: syncope ? ?Interval History/Subjective: Patient states he feels well today.  Specifically denies any chest pain or shortness of breath.  Tolerated dialysis yesterday without significant issues ? ? ?Medications:  ?Current Facility-Administered Medications  ?Medication Dose Route Frequency Provider  Last Rate Last Admin  ? 0.9 %  sodium chloride infusion  100 mL Intravenous PRN JMartinique Peter M, MD      ? 0.9 %  sodium chloride infusion  100 mL Intravenous PRN JMartinique Peter M, MD      ? acetaminophen (TYLENOL) tablet 650 mg  650 mg Oral Q6H PRN JMartinique Peter M, MD      ? albuterol (PROVENTIL) (2.5 MG/3ML) 0.083% nebulizer solution 3 mL  3 mL Inhalation Q6H PRN JMartinique Peter M, MD      ? ALPRAZolam (Duanne Moron tablet 0.5 mg  0.5 mg Oral BID PRN JMartinique Peter M, MD   0.5 mg at 11/21/21 2153  ? alteplase (CATHFLO ACTIVASE) injection 2 mg  2 mg Intracatheter Once PRN JMartinique Peter M, MD      ? atorvastatin (LIPITOR) tablet 80 mg  80 mg Oral Daily JMartinique Peter M, MD   80 mg at 11/21/21 1213  ? calcitRIOL (ROCALTROL) capsule 0.25 mcg  0.25 mcg Oral Q T,Th,Sat-1800 JMartinique Peter M, MD   0.25 mcg at 11/21/21 1742  ? calcium acetate (PHOSLO) capsule 1,334 mg  1,334 mg Oral TID with meals JMartinique Peter M, MD   1,334 mg at 11/21/21 1742  ? calcium acetate (PHOSLO) capsule 667 mg  667 mg Oral PRN JMartinique Peter M, MD      ? Chlorhexidine Gluconate Cloth 2 % PADS 6 each  6 each Topical Q0600 JMartinique Peter M, MD   6 each at 11/21/21 [phone] ? cinacalcet (SENSIPAR) tablet 30 mg  30 mg Oral Q T,Th,Sat-1800 JMartinique Peter M, MD   30 mg at 11/21/21 1742  ? clopidogrel (PLAVIX) tablet 75 mg  75 mg Oral Daily JMartinique Peter M, MD   [id number]  mg at 11/21/21 0641  ? diphenhydrAMINE (BENADRYL) capsule 25 mg  25 mg Oral Daily PRN Martinique, Peter M, MD      ? heparin injection 1,000 Units  1,000 Units Dialysis PRN Martinique, Peter M, MD      ? heparin injection 5,000 Units  5,000 Units Subcutaneous Q8H Martinique, Peter M, MD   5,000 Units at 11/22/21 9242  ? hydrocortisone cream 1 %   Topical BID PRN Martinique, Peter M, MD   Given at 11/21/21 0004  ? insulin aspart (novoLOG) injection 0-5 Units  0-5 Units Subcutaneous QHS Martinique, Peter M, MD      ? insulin aspart (novoLOG) injection 0-6 Units  0-6 Units Subcutaneous TID WC Martinique, Peter M, MD      ?  levothyroxine (SYNTHROID) tablet 150 mcg  150 mcg Oral Q0600 Martinique, Peter M, MD   150 mcg at 11/22/21 6834  ? lidocaine (PF) (XYLOCAINE) 1 % injection 5 mL  5 mL Intradermal PRN Martinique, Peter M, MD      ? lidocaine-prilocaine (EMLA) cream 1 application.  1 application. Topical PRN Martinique, Peter M, MD      ? midodrine (PROAMATINE) tablet 10 mg  10 mg Oral TID WC Martinique, Peter M, MD   10 mg at 11/21/21 1743  ? multivitamin (RENA-VIT) tablet 1 tablet  1 tablet Oral Daily Martinique, Peter M, MD   1 tablet at 11/21/21 1213  ? ondansetron (ZOFRAN) tablet 4 mg  4 mg Oral Q6H PRN Martinique, Peter M, MD      ? Or  ? ondansetron [facility]) injection 4 mg  4 mg Intravenous Q6H PRN Martinique, Peter M, MD      ? oxyCODONE (Oxy IR/ROXICODONE) immediate release tablet 5 mg  5 mg Oral Q4H PRN Martinique, Peter M, MD   5 mg at 11/22/21 1962  ? pantoprazole (PROTONIX) EC tablet 40 mg  40 mg Oral Daily PRN Martinique, Peter M, MD   40 mg at 11/20/21 2124  ? pentafluoroprop-tetrafluoroeth (GEBAUERS) aerosol 1 application.  1 application. Topical PRN Martinique, Peter M, MD      ? pregabalin (LYRICA) capsule 150 mg  150 mg Oral TID Martinique, Peter M, MD   150 mg at 11/21/21 2144  ? senna (SENOKOT) tablet 8.6 mg  1 tablet Oral PRN Martinique, Peter M, MD      ? sodium chloride flush (NS) 0.9 % injection 3 mL  3 mL Intravenous Q12H Martinique, Peter M, MD   3 mL at 11/21/21 2157  ?  ? ? ?Review of Systems: ?10 systems reviewed and negative except per interval history/subjective ? ?Physical Exam: ?Vitals:  ? 11/22/21 0614 11/22/21 0641  ?BP:    ?Pulse: 74 71  ?Resp: 16 19  ?Temp:    ?SpO2: 97% 98%  ? ?Total I/O ?In: 240 [P.O.:240] ?Out: -  ? ?Intake/Output Summary (Last 24 hours) at 11/22/2021 0938 ?Last data filed at 11/22/2021 0854 ?Gross per 24 hour  ?Intake 480 ml  ?Output 3000 ml  ?Net -2520 ml  ? ?General: well-appearing, no acute distress ?HEENT: anicteric sclera, MMM ?CV: normal rate, no murmurs, 2+ pitting edema of the left lower extremity ?Lungs: bilateral chest  rise, normal wob ?Abd: soft, non-tender, non-distended ?Skin: Excoriations present in the left lower extremity, Otherwise no visible lesions or rashes ?Psych: alert, engaged, appropriate mood and affect ?Neuro: normal speech, no gross focal deficits  ? ? ?Test Results ?I personally reviewed new and old clinical labs and radiology tests ?Lab Results  ?  Component Value Date  ? NA 138 11/21/2021  ? NA 138 11/21/2021  ? K 3.9 11/21/2021  ? K 3.9 11/21/2021  ? CL 105 11/21/2021  ? CO2 20 (L) 11/21/2021  ? BUN 57 (H) 11/21/2021  ? CREATININE 6.58 (H) 11/21/2021  ? CALCIUM 9.4 11/21/2021  ? ALBUMIN 2.7 (L) 11/21/2021  ? PHOS 8.9 (H) 11/21/2021  ? ? ?CBC ?Recent Labs  ?Lab 11/19/21 ?1401 11/20/21 ?3762 11/21/21 ?8315 11/21/21 ?1615 11/21/21 ?1622  ?WBC 5.6 6.1 8.2  --   --   ?NEUTROABS 3.9 3.6  --   --   --   ?HGB 8.7* 10.2* 10.8* 10.5* 10.5*  10.5*  ?HCT 27.4* 32.8* 32.6* 31.0* 31.0*  31.0*  ?MCV 101.5* 100.9* 98.5  --   --   ?PLT 155 140* 166  --   --   ? ? ? ? ? ?

## 2021-11-22 NOTE — Progress Notes (Addendum)
? ?Progress Note ? ?Patient Name: [name] ?Date of Encounter: 11/22/2021 ? ?[facility] Cardiologist: Rozann Lesches, MD  ? ?Subjective  ? ?No complaints this morning.  ? ?Inpatient Medications  ?  ?Scheduled Meds: ? atorvastatin  80 mg Oral Daily  ? calcitRIOL  0.25 mcg Oral Q T,Th,Sat-1800  ? calcium acetate  1,334 mg Oral TID with meals  ? Chlorhexidine Gluconate Cloth  6 each Topical Q0600  ? cinacalcet  30 mg Oral Q T,Th,Sat-1800  ? clopidogrel  75 mg Oral Daily  ? heparin  5,000 Units Subcutaneous Q8H  ? insulin aspart  0-5 Units Subcutaneous QHS  ? insulin aspart  0-6 Units Subcutaneous TID WC  ? levothyroxine  150 mcg Oral Q0600  ? midodrine  10 mg Oral TID WC  ? multivitamin  1 tablet Oral Daily  ? pregabalin  150 mg Oral TID  ? sodium chloride flush  3 mL Intravenous Q12H  ? ?Continuous Infusions: ? sodium chloride    ? sodium chloride    ? ?PRN Meds: ?sodium chloride, sodium chloride, acetaminophen **OR** [DISCONTINUED] acetaminophen, albuterol, ALPRAZolam, alteplase, calcium acetate, diphenhydrAMINE, heparin, hydrocortisone cream, lidocaine (PF), lidocaine-prilocaine, ondansetron **OR** ondansetron (ZOFRAN) IV, oxyCODONE, pantoprazole, pentafluoroprop-tetrafluoroeth, senna  ? ?Vital Signs  ?  ?Vitals:  ? 11/22/21 0345 11/22/21 0400 11/22/21 0614 11/22/21 0641  ?BP: 104/68     ?Pulse: 60 (!) 59 74 71  ?Resp: '16 14 16 19  '$ ?Temp: 98 ?F (36.7 ?C)     ?TempSrc: Axillary     ?SpO2: 94% 93% 97% 98%  ?Weight: 105.3 kg     ?Height:      ? ? ?Intake/Output Summary (Last 24 hours) at 11/22/2021 1044 ?Last data filed at 11/22/2021 0854 ?Gross per 24 hour  ?Intake 480 ml  ?Output 3000 ml  ?Net -2520 ml  ? ? ?  11/22/2021  ?  3:45 AM 11/21/2021  ? 11:26 AM 11/21/2021  ?  4:17 AM  ?Last 3 Weights  ?Weight (lbs) 232 lb 2.3 oz 231 lb 7.7 oz 240 lb 8.4 oz  ?Weight (kg) 105.3 kg 105 kg 109.1 kg  ?   ? ?Telemetry  ?  ?SR - Personally Reviewed ? ?ECG  ?  ?No new tracing ? ?Physical Exam  ? ?GEN: No acute distress.    ?Neck: No JVD ?Cardiac: RRR, 2/6 systolic murmur, no rubs, or gallops.  ?Respiratory: Clear to auscultation bilaterally. ?GI: Soft, nontender, non-distended  ?MS: R AKA, 1+ LLE edema chronic venous changes ?Neuro:  Nonfocal  ?Psych: Normal affect  ? ?Labs  ?  ?High Sensitivity Troponin:  No results for input(s): TROPONINIHS in the last 720 hours.   ?Chemistry ?Recent Labs  ?Lab 11/19/21 ?1401 11/20/21 ?2841 11/21/21 ?3244 11/21/21 ?1615 11/21/21 ?1622  ?NA 139 138 139 138 138  138  ?K 4.3 4.9 5.1 3.9 3.9  3.9  ?CL 102 102 105  --   --   ?CO2 23 23 20*  --   --   ?GLUCOSE 135* 138* 92  --   --   ?BUN 38* 45* 57*  --   --   ?CREATININE 4.39* 5.29* 6.58*  --   --   ?CALCIUM 8.9 9.4 9.4  --   --   ?MG  --  1.8  --   --   --   ?PROT 5.8* 6.1*  --   --   --   ?ALBUMIN 2.7* 2.8* 2.7*  --   --   ?AST 74* 84*  --   --   --   ?  ALT 47* 50*  --   --   --   ?ALKPHOS 188* 185*  --   --   --   ?BILITOT 0.7 0.8  --   --   --   ?GFRNONAA 14* 11* 8*  --   --   ?ANIONGAP '14 13 14  '$ --   --   ?  ?Lipids No results for input(s): CHOL, TRIG, HDL, LABVLDL, LDLCALC, CHOLHDL in the last 168 hours.  ?Hematology ?Recent Labs  ?Lab 11/19/21 ?1401 11/20/21 ?8341 11/21/21 ?9622 11/21/21 ?1615 11/21/21 ?1622  ?WBC 5.6 6.1 8.2  --   --   ?RBC 2.70* 3.25* 3.31*  --   --   ?HGB 8.7* 10.2* 10.8* 10.5* 10.5*  10.5*  ?HCT 27.4* 32.8* 32.6* 31.0* 31.0*  31.0*  ?MCV 101.5* 100.9* 98.5  --   --   ?MCH 32.2 31.4 32.6  --   --   ?MCHC 31.8 31.1 33.1  --   --   ?RDW 18.5* 18.3* 18.3*  --   --   ?PLT 155 140* 166  --   --   ? ?Thyroid No results for input(s): TSH, FREET4 in the last 168 hours.  ?BNPNo results for input(s): BNP, PROBNP in the last 168 hours.  ?DDimer No results for input(s): DDIMER in the last 168 hours.  ? ?Radiology  ?  ?CARDIAC CATHETERIZATION ? ?Result Date: 11/21/2021 ?  Prox LAD lesion is 50% stenosed.   Mid LAD lesion is 90% stenosed.   1st Diag lesion is 100% stenosed.   Prox RCA lesion is 100% stenosed.   Hemodynamic findings  consistent with moderate pulmonary hypertension. Severe 2 vessel obstructive CAD. The LAD has a 90% stenosis at the first septal perforator. The first diagonal appears to be occluded with collaterals. The RCA is occluded proximally with left to right collaterals. All vessels are severely calcified. Moderately elevated LV filling pressures. PCWP mean 20 mm Hg High RV filling pressures with RA pressure of 22 mm Hg Moderate to severe pulmonary HTN with mean PAP 44 mm Hg Cardiac index of 2.5 is probably low for someone with AV fistula. Severe PAD with difficult arterial access Plan: will need heart team approach. Patient has significant CAD, aortic stenosis, decompensated CHF. Multiple co morbidities and difficult access.  ? ?US Carotid Bilateral ? ?Result Date: 11/20/2021 ?CLINICAL DATA:  71 year old male with history of syncope. History of left carotid endarterectomy. EXAM: BILATERAL CAROTID DUPLEX ULTRASOUND TECHNIQUE: Pearline Cables scale imaging, color Doppler and duplex ultrasound were performed of bilateral carotid and vertebral arteries in the neck. COMPARISON:  None. FINDINGS: Criteria: Quantification of carotid stenosis is based on velocity parameters that correlate the residual internal carotid diameter with NASCET-based stenosis levels, using the diameter of the distal internal carotid lumen as the denominator for stenosis measurement. The following velocity measurements were obtained: RIGHT ICA: Peak systolic velocity 64 cm/sec, End diastolic velocity 17 cm/sec CCA: Peak systolic velocity 59 cm/sec SYSTOLIC ICA/CCA RATIO:  1.1 ECA: Peak systolic velocity 56 cm/sec LEFT ICA: Peak systolic velocity 95 cm/sec, End diastolic velocity 24 cm/sec CCA: 60 cm/sec SYSTOLIC ICA/CCA RATIO:  1.6 ECA: 292 cm/sec RIGHT CAROTID ARTERY: Mild focal atherosclerotic plaque formation about the proximal ICA. No significant tortuosity. Normal low resistance waveforms. RIGHT VERTEBRAL ARTERY:  Antegrade flow. LEFT CAROTID ARTERY: No  significant atherosclerotic plaque formation. No significant tortuosity. Normal low resistance waveforms. LEFT VERTEBRAL ARTERY:  Antegrade flow. Upper extremity non-invasive blood pressures: Not obtained. IMPRESSION: 1. Right carotid artery system: Less than  50% stenosis secondary to mild atherosclerotic plaque formation in the proximal internal carotid artery. 2. Left carotid artery system: Patent without significant atherosclerotic plaque formation. 3.  Vertebral artery system: Patent with antegrade flow bilaterally. Ruthann Cancer, MD [facility] Radiology Electronically Signed   By: Ruthann Cancer M.D.   On: 11/20/2021 15:32  ? ?ECHOCARDIOGRAM COMPLETE ? ?Result Date: 11/20/2021 ?   ECHOCARDIOGRAM REPORT   Patient Name:   THORNE WIRZ Date of Exam: 11/20/2021 Medical Rec #:  [id number]       Height:       76.0 in Accession #:    [id number]      Weight:       286.6 lb Date of Birth:  May 06, 1951       BSA:          2.581 m? Patient Age:    71 years        BP:           92/68 mmHg Patient Gender: M               HR:           68 bpm. Exam Location:  [facility] Procedure: 2D Echo, Cardiac Doppler and Color Doppler Indications:    Syncope  History:        Patient has prior history of Echocardiogram examinations, most                 recent 02/03/2020. CAD, PAD and COPD, Signs/Symptoms:Edema and                 Syncope; Risk Factors:Hypertension, Diabetes, Dyslipidemia and                 Current Smoker. AAA, BKA. Some images by Lonn Georgia, student.  Sonographer:    Wenda Low Referring Phys: [id number] ASIA B [place]  1. Left ventricular ejection fraction, by estimation, is 30 to 35%. The left ventricle has moderately decreased function. The left ventricle demonstrates global hypokinesis. The left ventricular internal cavity size was mildly dilated. There is moderate  left ventricular hypertrophy. Left ventricular diastolic parameters are indeterminate.  2.  Right ventricular systolic function is moderately reduced. The right ventricular size is mildly enlarged. There is mildly elevated pulmonary artery systolic pressure.  3. Left atrial size was moderately dilated.  4. The

## 2021-11-23 DIAGNOSIS — N186 End stage renal disease: Secondary | ICD-10-CM | POA: Diagnosis not present

## 2021-11-23 DIAGNOSIS — R55 Syncope and collapse: Secondary | ICD-10-CM | POA: Diagnosis not present

## 2021-11-23 DIAGNOSIS — I739 Peripheral vascular disease, unspecified: Secondary | ICD-10-CM | POA: Diagnosis not present

## 2021-11-23 DIAGNOSIS — I5020 Unspecified systolic (congestive) heart failure: Secondary | ICD-10-CM | POA: Diagnosis not present

## 2021-11-23 DIAGNOSIS — I350 Nonrheumatic aortic (valve) stenosis: Secondary | ICD-10-CM | POA: Diagnosis not present

## 2021-11-23 LAB — RENAL FUNCTION PANEL
Albumin: 2.7 g/dL — ABNORMAL LOW (ref 3.5–5.0)
Anion gap: 13 (ref 5–15)
BUN: 56 mg/dL — ABNORMAL HIGH (ref 8–23)
CO2: 25 mmol/L (ref 22–32)
Calcium: 9.3 mg/dL (ref 8.9–10.3)
Chloride: 101 mmol/L (ref 98–111)
Creatinine, Ser: 6.54 mg/dL — ABNORMAL HIGH (ref 0.61–1.24)
GFR, Estimated: 8 mL/min — ABNORMAL LOW (ref >60)
Glucose, Bld: 130 mg/dL — ABNORMAL HIGH (ref 70–99)
Phosphorus: 8.2 mg/dL — ABNORMAL HIGH (ref 2.5–4.6)
Potassium: 5.4 mmol/L — ABNORMAL HIGH (ref 3.5–5.1)
Sodium: 139 mmol/L (ref 135–145)

## 2021-11-23 LAB — CBC
HCT: 33.1 % — ABNORMAL LOW (ref 39.0–52.0)
Hemoglobin: 10.6 g/dL — ABNORMAL LOW (ref 13.0–17.0)
MCH: 32 pg (ref 26.0–34.0)
MCHC: 32 g/dL (ref 30.0–36.0)
MCV: 100 fL (ref 80.0–100.0)
Platelets: 151 10*3/uL (ref 150–400)
RBC: 3.31 MIL/uL — ABNORMAL LOW (ref 4.22–5.81)
RDW: 18.3 % — ABNORMAL HIGH (ref 11.5–15.5)
WBC: 8 10*3/uL (ref 4.0–10.5)
nRBC: 0.2 % (ref 0.0–0.2)

## 2021-11-23 LAB — GLUCOSE, CAPILLARY
Glucose-Capillary: 113 mg/dL — ABNORMAL HIGH (ref 70–99)
Glucose-Capillary: 88 mg/dL (ref 70–99)
Glucose-Capillary: 171 mg/dL — ABNORMAL HIGH (ref 70–99)

## 2021-11-23 NOTE — Progress Notes (Signed)
removed 2552ms net fluid,. unable to remove more due to severe hypotension .  pre bp 116/72 post bp 119/64 pre weight 107.5kg post weight 105.0kg bed scales. 2 bandages to lua avf no bleeding dressing cdi.  gave calcitriol as ordered. ?

## 2021-11-23 NOTE — Progress Notes (Signed)
? [name]  GNO:[id number] DOB: 1950/09/10 DOA: 11/19/2021 ?PCP: Caryl Bis, [name]   ? ?Brief Narrative:  ?71 year old with a history of ESRD on HD TTS, COPD, CAD, HTN, PVD status post right AKA, chronic left lower extremity wound, and DM2 who presented to the ED 4 9 after suffering 2 separate syncopal spells with no prodrome.  He was transported to the [facility], ED via EMS and was found to have a blood pressure 58/40 and was minimally responsive.  Volume resuscitation at 1.5 L resulted in improvement in blood pressure and mentation.  TTE revealed severe aortic stenosis compared to mild aortic stenosis on previous TTE 2021.  LV systolic dysfunction with EF of 30-35% was also appreciated.  The patient was subsequently transferred to [facility] for a planned cardiac cath. ? ?Consultants:  ?Cardiology ?Nephrology ? ?Code Status: FULL CODE ? ?DVT prophylaxis: ?Subcutaneous heparin ? ?Interim Hx: ?Patient was seen in dialysis.  Thus far he is tolerating his treatment without difficulty.  He is tolerating the addition of nitrate and beta-blocker made by cardiology yesterday.  We discussed his condition again.  I spoke to him about CODE BLUE status but at present he wishes to remain full code.  He again tells me he is anxious to go home. ? ?After my visit with the patient I spoke with his sister over the phone who is listed as one of his appropriate contacts.  I explained his medical diagnoses and the gravity of his situation.  The sister expressed concern that the patient and his wife are unable to provide for his care at home, including the inability to transport him to dialysis safely and reliably given his progressive weakness and limited exertional tolerance.  We have agreed to postpone his discharge and to investigate opportunities to assist him further either at home or in a different venue if this is necessary. ? ?Assessment & Plan: ? ?Severe aortic stenosis with syncope ?Cardiology following -not  felt to be a candidate for open heart valve surgery or CABG - medical management being titrated with patient thus far tolerating nitrate and beta-blocker ? ?CAD ?Cardiac cath 4/11 revealed severe two-vessel obstructive CAD with 90% stenosis of the mid LAD and severe calcification of all vessels -heart team approach to be utilized - not a candidate for intervention or surgery  ? ?Newly diagnosed systolic congestive heart failure ?Volume management per HD - likely due to CAD  ? ?Possible sepsis -possible pneumonia - ruled out clinically  ?Findings likely explained by severe aortic stenosis -discontinued antibiotic -no persisting evidence to suggest acute infection ? ?Hypotension ?Likely explained by severe aortic stenosis -patient will be very much dependent upon appropriate volume balance -blood pressure stable at time of discharge ? ?DM2 ?CBG reasonably controlled at present -primary goal is to avoid extremes ? ?Hypothyroidism ?Continue usual Synthroid dose ? ?Peripheral vascular disease status post right AKA ?Continue statin ? ?ESRD on HD TTS ?Left upper extremity aVF -nephrology following and attending to dialysis ? ? ?Family Communication: Spoke with sister at length over the telephone ?Disposition: from home -family concerned they can no longer provide appropriate care for him at home -palliative consultation -investigate options for assistance to allow discharge home ? ?Objective: ?Blood pressure 122/75, pulse (!) 59, temperature 97.9 ?F (36.6 ?C), temperature source Oral, resp. rate 13, height '6\' 4"'$  (1.93 m), weight 107 kg, SpO2 93 %. ? ?Intake/Output Summary (Last 24 hours) at 11/23/2021 0847 ?Last data filed at 11/23/2021 1694 ?Gross per 24  hour  ?Intake 600 ml  ?Output --  ?Net 600 ml  ? ? ?Filed Weights  ? 11/21/21 1126 11/22/21 0345 11/23/21 0402  ?Weight: 105 kg 105.3 kg 107 kg  ? ? ?Examination: ?General: No acute respiratory distress at rest ?Lungs: Mild bibasilar crackles  ?Cardiovascular: Regular rate  without rub with holosystolic murmur ?Abdomen: Nontender, nondistended, soft, bowel sounds positive, no rebound ?Extremities: 1+ anasarca -left leg wound dressed and dry ? ?CBC: ?Recent Labs  ?Lab 11/19/21 ?1401 11/20/21 ?0623 11/21/21 ?7628 11/21/21 ?1615 11/21/21 ?1622  ?WBC 5.6 6.1 8.2  --   --   ?NEUTROABS 3.9 3.6  --   --   --   ?HGB 8.7* 10.2* 10.8* 10.5* 10.5*  10.5*  ?HCT 27.4* 32.8* 32.6* 31.0* 31.0*  31.0*  ?MCV 101.5* 100.9* 98.5  --   --   ?PLT 155 140* 166  --   --   ? ? ?Basic Metabolic Panel: ?Recent Labs  ?Lab 11/19/21 ?1401 11/20/21 ?3151 11/21/21 ?7616 11/21/21 ?1615 11/21/21 ?1622  ?NA 139 138 139 138 138  138  ?K 4.3 4.9 5.1 3.9 3.9  3.9  ?CL 102 102 105  --   --   ?CO2 23 23 20*  --   --   ?GLUCOSE 135* 138* 92  --   --   ?BUN 38* 45* 57*  --   --   ?CREATININE 4.39* 5.29* 6.58*  --   --   ?CALCIUM 8.9 9.4 9.4  --   --   ?MG  --  1.8  --   --   --   ?PHOS  --   --  8.9*  --   --   ? ? ?GFR: ?Estimated Creatinine Clearance: 13.8 mL/min (A) (by C-G formula based on SCr of 6.58 mg/dL (H)). ? ?Liver Function Tests: ?Recent Labs  ?Lab 11/19/21 ?1401 11/20/21 ?0737 11/21/21 ?0734  ?AST 74* 84*  --   ?ALT 47* 50*  --   ?ALKPHOS 188* 185*  --   ?BILITOT 0.7 0.8  --   ?PROT 5.8* 6.1*  --   ?ALBUMIN 2.7* 2.8* 2.7*  ? ? ? ?HbA1C: ?Hgb A1c MFr Bld  ?Date/Time Value Ref Range Status  ?11/20/2021 05:46 AM 6.0 (H) 4.8 - 5.6 % Final  ?  Comment:  ?  (NOTE) ?Pre diabetes:          5.7%-6.4% ? ?Diabetes:              >6.4% ? ?Glycemic control for   <7.0% ?adults with diabetes ?  ?10/20/2020 03:54 PM 6.2 (H) 4.8 - 5.6 % Final  ?  Comment:  ?  (NOTE) ?Pre diabetes:          5.7%-6.4% ? ?Diabetes:              >6.4% ? ?Glycemic control for   <7.0% ?adults with diabetes ?  ? ? ?CBG: ?Recent Labs  ?Lab 11/22/21 ?1062 11/22/21 ?1114 11/22/21 ?1608 11/22/21 ?2129 11/23/21 ?0654  ?GLUCAP 143* 187* 162* 129* 113*  ? ? ? ?Scheduled Meds: ? atorvastatin  80 mg Oral Daily  ? calcitRIOL  0.25 mcg Oral Q T,Th,Sat-1800  ?  calcium acetate  1,334 mg Oral TID with meals  ? Chlorhexidine Gluconate Cloth  6 each Topical Q0600  ? cinacalcet  30 mg Oral Q T,Th,Sat-1800  ? clopidogrel  75 mg Oral Daily  ? heparin  5,000 Units Subcutaneous Q8H  ? insulin aspart  0-5 Units Subcutaneous QHS  ?  insulin aspart  0-6 Units Subcutaneous TID WC  ? isosorbide mononitrate  15 mg Oral Daily  ? levothyroxine  150 mcg Oral Q0600  ? metoprolol tartrate  12.5 mg Oral BID  ? midodrine  10 mg Oral TID WC  ? multivitamin  1 tablet Oral Daily  ? pregabalin  150 mg Oral TID  ? sodium chloride flush  3 mL Intravenous Q12H  ? ? ? LOS: 3 days  ? ?Clayton Altes, [name] ?[facility] ?Office  [phone] ?Pager - Text Page per Shea Evans ? ?If 7PM-7AM, please contact night-coverage per Amion ?11/23/2021, 8:47 AM ? ? ? ? ?

## 2021-11-23 NOTE — Progress Notes (Addendum)
? ?Progress Note ? ?Patient Name: [name] ?Date of Encounter: 11/23/2021 ? ?[facility] Cardiologist: Rozann Lesches, MD  ? ?Subjective  ? ?Having dialysis ? ?Inpatient Medications  ?  ?Scheduled Meds: ? atorvastatin  80 mg Oral Daily  ? calcitRIOL  0.25 mcg Oral Q T,Th,Sat-1800  ? calcium acetate  1,334 mg Oral TID with meals  ? Chlorhexidine Gluconate Cloth  6 each Topical Q0600  ? cinacalcet  30 mg Oral Q T,Th,Sat-1800  ? clopidogrel  75 mg Oral Daily  ? heparin  5,000 Units Subcutaneous Q8H  ? insulin aspart  0-5 Units Subcutaneous QHS  ? insulin aspart  0-6 Units Subcutaneous TID WC  ? isosorbide mononitrate  15 mg Oral Daily  ? levothyroxine  150 mcg Oral Q0600  ? metoprolol tartrate  12.5 mg Oral BID  ? midodrine  10 mg Oral TID WC  ? multivitamin  1 tablet Oral Daily  ? pregabalin  150 mg Oral TID  ? sodium chloride flush  3 mL Intravenous Q12H  ? ?Continuous Infusions: ? sodium chloride    ? sodium chloride    ? ?PRN Meds: ?sodium chloride, sodium chloride, acetaminophen **OR** [DISCONTINUED] acetaminophen, albuterol, ALPRAZolam, alteplase, calcium acetate, diphenhydrAMINE, heparin, hydrocortisone cream, lidocaine (PF), lidocaine-prilocaine, ondansetron **OR** ondansetron (ZOFRAN) IV, oxyCODONE, pantoprazole, pentafluoroprop-tetrafluoroeth, senna  ? ?Vital Signs  ?  ?Vitals:  ? 11/23/21 0804 11/23/21 0917 11/23/21 1043 11/23/21 1100  ?BP: 122/75 138/79 116/72 112/76  ?Pulse:  (!) 58 (!) 54 (!) 54  ?Resp:  '18 11 16  '$ ?Temp:  (!) 97.4 ?F (36.3 ?C)    ?TempSrc:      ?SpO2:      ?Weight:  107.5 kg    ?Height:      ? ? ?Intake/Output Summary (Last 24 hours) at 11/23/2021 1126 ?Last data filed at 11/23/2021 0569 ?Gross per 24 hour  ?Intake 360 ml  ?Output --  ?Net 360 ml  ? ? ?  11/23/2021  ?  9:17 AM 11/23/2021  ?  4:02 AM 11/22/2021  ?  3:45 AM  ?Last 3 Weights  ?Weight (lbs) 236 lb 15.9 oz 235 lb 14.3 oz 232 lb 2.3 oz  ?Weight (kg) 107.5 kg 107 kg 105.3 kg  ?   ? ?Telemetry  ?  ?SR - Personally  Reviewed ? ?ECG  ?  ?No new - Personally Reviewed ? ?Physical Exam  ?EXAM per Dr. Claiborne Billings ? ?GEN: No acute distress.   ?Neck: No JVD ?Cardiac: RRR, no murmurs, rubs, or gallops.  ?Respiratory: Clear to auscultation bilaterally. ?GI: Soft, nontender, non-distended  ?MS: No edema; No deformity. ?Neuro:  Nonfocal  ?Psych: Normal affect  ? ?Labs  ?  ?High Sensitivity Troponin:  No results for input(s): TROPONINIHS in the last 720 hours.   ?Chemistry ?Recent Labs  ?Lab 11/19/21 ?1401 11/20/21 ?7948 11/21/21 ?0734 11/21/21 ?1615 11/21/21 ?1622 11/23/21 ?0165  ?NA 139 138 139 138 138  138 139  ?K 4.3 4.9 5.1 3.9 3.9  3.9 5.4*  ?CL 102 102 105  --   --  101  ?CO2 23 23 20*  --   --  25  ?GLUCOSE 135* 138* 92  --   --  130*  ?BUN 38* 45* 57*  --   --  56*  ?CREATININE 4.39* 5.29* 6.58*  --   --  6.54*  ?CALCIUM 8.9 9.4 9.4  --   --  9.3  ?MG  --  1.8  --   --   --   --   ?  PROT 5.8* 6.1*  --   --   --   --   ?ALBUMIN 2.7* 2.8* 2.7*  --   --  2.7*  ?AST 74* 84*  --   --   --   --   ?ALT 47* 50*  --   --   --   --   ?ALKPHOS 188* 185*  --   --   --   --   ?BILITOT 0.7 0.8  --   --   --   --   ?GFRNONAA 14* 11* 8*  --   --  8*  ?ANIONGAP '14 13 14  '$ --   --  13  ?  ?Lipids No results for input(s): CHOL, TRIG, HDL, LABVLDL, LDLCALC, CHOLHDL in the last 168 hours.  ?Hematology ?Recent Labs  ?Lab 11/20/21 ?1443 11/21/21 ?1540 11/21/21 ?1615 11/21/21 ?1622 11/23/21 ?0867  ?WBC 6.1 8.2  --   --  8.0  ?RBC 3.25* 3.31*  --   --  3.31*  ?HGB 10.2* 10.8* 10.5* 10.5*  10.5* 10.6*  ?HCT 32.8* 32.6* 31.0* 31.0*  31.0* 33.1*  ?MCV 100.9* 98.5  --   --  100.0  ?MCH 31.4 32.6  --   --  32.0  ?MCHC 31.1 33.1  --   --  32.0  ?RDW 18.3* 18.3*  --   --  18.3*  ?PLT 140* 166  --   --  151  ? ?Thyroid No results for input(s): TSH, FREET4 in the last 168 hours.  ?BNPNo results for input(s): BNP, PROBNP in the last 168 hours.  ?DDimer No results for input(s): DDIMER in the last 168 hours.  ? ?Radiology  ?  ?CARDIAC CATHETERIZATION ? ?Result Date:  11/21/2021 ?  Prox LAD lesion is 50% stenosed.   Mid LAD lesion is 90% stenosed.   1st Diag lesion is 100% stenosed.   Prox RCA lesion is 100% stenosed.   Hemodynamic findings consistent with moderate pulmonary hypertension. Severe 2 vessel obstructive CAD. The LAD has a 90% stenosis at the first septal perforator. The first diagonal appears to be occluded with collaterals. The RCA is occluded proximally with left to right collaterals. All vessels are severely calcified. Moderately elevated LV filling pressures. PCWP mean 20 mm Hg High RV filling pressures with RA pressure of 22 mm Hg Moderate to severe pulmonary HTN with mean PAP 44 mm Hg Cardiac index of 2.5 is probably low for someone with AV fistula. Severe PAD with difficult arterial access Plan: will need heart team approach. Patient has significant CAD, aortic stenosis, decompensated CHF. Multiple co morbidities and difficult access.   ? ?Cardiac Studies  ? ?Cath: 11/21/21 ?  ?  Prox LAD lesion is 50% stenosed. ?  Mid LAD lesion is 90% stenosed. ?  1st Diag lesion is 100% stenosed. ?  Prox RCA lesion is 100% stenosed. ?  Hemodynamic findings consistent with moderate pulmonary hypertension. ?  ?Severe 2 vessel obstructive CAD. The LAD has a 90% stenosis at the first septal perforator. The first diagonal appears to be occluded with collaterals. The RCA is occluded proximally with left to right collaterals. All vessels are severely calcified. ?Moderately elevated LV filling pressures. PCWP mean 20 mm Hg ?High RV filling pressures with RA pressure of 22 mm Hg ?Moderate to severe pulmonary HTN with mean PAP 44 mm Hg ?Cardiac index of 2.5 is probably low for someone with AV fistula. ?Severe PAD with difficult arterial access ?  ?Plan: will need heart team approach. Patient  has significant CAD, aortic stenosis, decompensated CHF. Multiple co morbidities and difficult access.  ?  ?Diagnostic ?Dominance: Right ?Echo: 11/20/21 ?  ?IMPRESSIONS  ? ? ? 1. Left ventricular  ejection fraction, by estimation, is 30 to 35%. The  ?left ventricle has moderately decreased function. The left ventricle  ?demonstrates global hypokinesis. The left ventricular internal cavity size  ?was mildly dilated. There is moderate  ? left ventricular hypertrophy. Left ventricular diastolic parameters are  ?indeterminate.  ? 2. Right ventricular systolic function is moderately reduced. The right  ?ventricular size is mildly enlarged. There is mildly elevated pulmonary  ?artery systolic pressure.  ? 3. Left atrial size was moderately dilated.  ? 4. The mitral valve is degenerative. Mild mitral valve regurgitation. No  ?evidence of mitral stenosis. Moderate mitral annular calcification.  ? 5. Severe low flow AS with moderately decreased EF. AS/decreased EF new  ?since 2021 . The aortic valve was not well visualized. There is severe  ?calcifcation of the aortic valve. There is severe thickening of the aortic  ?valve. Aortic valve regurgitation  ?is trivial. Severe aortic valve stenosis.  ? 6. The inferior vena cava is dilated in size with <50% respiratory  ?variability, suggesting right atrial pressure of 15 mmHg.  ? ?FINDINGS  ? Left Ventricle: Left ventricular ejection fraction, by estimation, is 30  ?to 35%. The left ventricle has moderately decreased function. The left  ?ventricle demonstrates global hypokinesis. The left ventricular internal  ?cavity size was mildly dilated.  ?There is moderate left ventricular hypertrophy. Left ventricular diastolic  ?parameters are indeterminate.  ? ?Right Ventricle: The right ventricular size is mildly enlarged. No  ?increase in right ventricular wall thickness. Right ventricular systolic  ?function is moderately reduced. There is mildly elevated pulmonary artery  ?systolic pressure. The tricuspid  ?regurgitant velocity is 2.54 m/s, and with an assumed right atrial  ?pressure of 15 mmHg, the estimated right ventricular systolic pressure is  ?53.2 mmHg.  ? ?Left Atrium:  Left atrial size was moderately dilated.  ? ?Right Atrium: Right atrial size was normal in size.  ? ?Pericardium: There is no evidence of pericardial effusion.  ? ?Mitral Valve: The mitral valve is degenerative in appea

## 2021-11-23 NOTE — Progress Notes (Signed)
Nephrology Follow-Up Consult note ? ? ?  ?  ?Assessment/Recommendations: [name] is a/an 71 y.o. male with a past medical history notable for ESRD on HD admitted with syncope.  ? ?Outpatient dialysis unit: [facility] ?Outpatient dialysis schedule: TTS ?Script: 2K, 2.5 Ca, EDW 106, DFR 500, 4.5hrs, NIPRO, AVF, heparin 3500 units loading then 2000 mid treatment, sensipar '30mg'$  TTS, calcitriol 0.48mg TTS ?  ?# ESRD: Continue HD per tts schedule ?# Volume/ hypertension: EDW 106kg, total body volume overload chronic. Volume removal as tolerated ?# Anemia of Chronic Kidney Disease: Hemoglobin 10.5. Currently receiving no esa.  ?# Secondary Hyperparathyroidism/Hyperphosphatemia: Continue home sensipar and calcitrio..Marland Kitchen Phosphorus was elevated.  Continue home binders ?# Vascular access: AVF with no issues ?# Syncope: Concerning for cardiac etiology with newly low EF. Had LHC and RHC on 4/11 w/ multivessel CAD, decompensated CHF, and pHTN.  Options regarding interventions are fairly limited.  Palliative care involved. ?  ?# Additional recommendations: ?- Dose all meds for creatinine clearance < 10 ml/min  ?- Unless absolutely necessary, no MRIs with gadolinium.  ?- Implement save arm precautions.  Prefer needle sticks in the dorsum of the hands or wrists.  No blood pressure measurements in arm. ?- If blood transfusion is requested during hemodialysis sessions, please alert uKoreaprior to the session.  ?- Use synthetic opioids (Fentanyl/Dilaudid) if needed ?  ?Recommendations were discussed with the primary team. ?  ? ? ?Recommendations conveyed to primary service.  ? ? ?SReesa Chew?[facility] ?11/23/2021 ?8:59 AM ? ?___________________________________________________________ ? ?CC: syncope ? ?Interval History/Subjective: Patient has no complaints today.  Specifically denies chest pain or shortness of breath. ? ? ?Medications:  ?Current Facility-Administered Medications  ?Medication Dose Route  Frequency Provider Last Rate Last Admin  ? 0.9 %  sodium chloride infusion  100 mL Intravenous PRN JMartinique Peter M, MD      ? 0.9 %  sodium chloride infusion  100 mL Intravenous PRN JMartinique Peter M, MD      ? acetaminophen (TYLENOL) tablet 650 mg  650 mg Oral Q6H PRN JMartinique Peter M, MD      ? albuterol (PROVENTIL) (2.5 MG/3ML) 0.083% nebulizer solution 3 mL  3 mL Inhalation Q6H PRN JMartinique Peter M, MD      ? ALPRAZolam (Duanne Moron tablet 0.5 mg  0.5 mg Oral BID PRN JMartinique Peter M, MD   0.5 mg at 11/22/21 2249  ? alteplase (CATHFLO ACTIVASE) injection 2 mg  2 mg Intracatheter Once PRN JMartinique Peter M, MD      ? atorvastatin (LIPITOR) tablet 80 mg  80 mg Oral Daily JMartinique Peter M, MD   80 mg at 11/23/21 0805  ? calcitRIOL (ROCALTROL) capsule 0.25 mcg  0.25 mcg Oral Q T,Th,Sat-1800 JMartinique Peter M, MD   0.25 mcg at 11/21/21 1742  ? calcium acetate (PHOSLO) capsule 1,334 mg  1,334 mg Oral TID with meals JMartinique Peter M, MD   1,334 mg at 11/23/21 0816  ? calcium acetate (PHOSLO) capsule 667 mg  667 mg Oral PRN JMartinique Peter M, MD      ? Chlorhexidine Gluconate Cloth 2 % PADS 6 each  6 each Topical Q0600 JMartinique Peter M, MD   6 each at 11/23/21 [phone] ? cinacalcet (SENSIPAR) tablet 30 mg  30 mg Oral Q T,Th,Sat-1800 JMartinique Peter M, MD   30 mg at 11/21/21 1742  ? clopidogrel (PLAVIX) tablet 75 mg  75 mg Oral Daily JMartinique Peter M, MD  75 mg at 11/23/21 0805  ? diphenhydrAMINE (BENADRYL) capsule 25 mg  25 mg Oral Daily PRN Martinique, Peter M, MD      ? heparin injection 1,000 Units  1,000 Units Dialysis PRN Martinique, Peter M, MD      ? heparin injection 5,000 Units  5,000 Units Subcutaneous Q8H Martinique, Peter M, MD   5,000 Units at 11/23/21 0547  ? hydrocortisone cream 1 %   Topical BID PRN Martinique, Peter M, MD   Given at 11/21/21 0004  ? insulin aspart (novoLOG) injection 0-5 Units  0-5 Units Subcutaneous QHS Martinique, Peter M, MD      ? insulin aspart (novoLOG) injection 0-6 Units  0-6 Units Subcutaneous TID WC Martinique, Peter M, MD    1 Units at 11/22/21 1706  ? isosorbide mononitrate (IMDUR) 24 hr tablet 15 mg  15 mg Oral Daily Troy Sine, MD   15 mg at 11/23/21 0805  ? levothyroxine (SYNTHROID) tablet 150 mcg  150 mcg Oral Q0600 Martinique, Peter M, MD   150 mcg at 11/23/21 0547  ? lidocaine (PF) (XYLOCAINE) 1 % injection 5 mL  5 mL Intradermal PRN Martinique, Peter M, MD      ? lidocaine-prilocaine (EMLA) cream 1 application.  1 application. Topical PRN Martinique, Peter M, MD      ? metoprolol tartrate (LOPRESSOR) tablet 12.5 mg  12.5 mg Oral BID Troy Sine, MD   12.5 mg at 11/23/21 3716  ? midodrine (PROAMATINE) tablet 10 mg  10 mg Oral TID WC Martinique, Peter M, MD   10 mg at 11/23/21 0805  ? multivitamin (RENA-VIT) tablet 1 tablet  1 tablet Oral Daily Martinique, Peter M, MD   1 tablet at 11/23/21 9678  ? ondansetron (ZOFRAN) tablet 4 mg  4 mg Oral Q6H PRN Martinique, Peter M, MD      ? Or  ? ondansetron [facility]) injection 4 mg  4 mg Intravenous Q6H PRN Martinique, Peter M, MD      ? oxyCODONE (Oxy IR/ROXICODONE) immediate release tablet 5 mg  5 mg Oral Q4H PRN Martinique, Peter M, MD   5 mg at 11/23/21 9381  ? pantoprazole (PROTONIX) EC tablet 40 mg  40 mg Oral Daily PRN Martinique, Peter M, MD   40 mg at 11/20/21 2124  ? pentafluoroprop-tetrafluoroeth (GEBAUERS) aerosol 1 application.  1 application. Topical PRN Martinique, Peter M, MD      ? pregabalin (LYRICA) capsule 150 mg  150 mg Oral TID Martinique, Peter M, MD   150 mg at 11/23/21 0805  ? senna (SENOKOT) tablet 8.6 mg  1 tablet Oral PRN Martinique, Peter M, MD      ? sodium chloride flush (NS) 0.9 % injection 3 mL  3 mL Intravenous Q12H Martinique, Peter M, MD   3 mL at 11/23/21 0175  ?  ? ? ?Review of Systems: ?10 systems reviewed and negative except per interval history/subjective ? ?Physical Exam: ?Vitals:  ? 11/23/21 0730 11/23/21 0804  ?BP: 108/69 122/75  ?Pulse: (!) 59   ?Resp: 13   ?Temp: 97.9 ?F (36.6 ?C)   ?SpO2: 93%   ? ?Total I/O ?In: 120 [P.O.:120] ?Out: -  ? ?Intake/Output Summary (Last 24 hours) at  11/23/2021 0859 ?Last data filed at 11/23/2021 1025 ?Gross per 24 hour  ?Intake 360 ml  ?Output --  ?Net 360 ml  ? ?General: well-appearing, no acute distress ?HEENT: anicteric sclera, MMM ?CV: normal rate, no murmurs, 2+ pitting edema of the left lower  extremity ?Lungs: bilateral chest rise, normal wob ?Abd: soft, non-tender, non-distended ?Skin: Excoriations present in the left lower extremity, Otherwise no visible lesions or rashes ?Psych: alert, engaged, appropriate mood and affect ?Neuro: normal speech, no gross focal deficits  ? ? ?Test Results ?I personally reviewed new and old clinical labs and radiology tests ?Lab Results  ?Component Value Date  ? NA 138 11/21/2021  ? NA 138 11/21/2021  ? K 3.9 11/21/2021  ? K 3.9 11/21/2021  ? CL 105 11/21/2021  ? CO2 20 (L) 11/21/2021  ? BUN 57 (H) 11/21/2021  ? CREATININE 6.58 (H) 11/21/2021  ? CALCIUM 9.4 11/21/2021  ? ALBUMIN 2.7 (L) 11/21/2021  ? PHOS 8.9 (H) 11/21/2021  ? ? ?CBC ?Recent Labs  ?Lab 11/19/21 ?1401 11/20/21 ?7001 11/21/21 ?7494 11/21/21 ?1615 11/21/21 ?1622  ?WBC 5.6 6.1 8.2  --   --   ?NEUTROABS 3.9 3.6  --   --   --   ?HGB 8.7* 10.2* 10.8* 10.5* 10.5*  10.5*  ?HCT 27.4* 32.8* 32.6* 31.0* 31.0*  31.0*  ?MCV 101.5* 100.9* 98.5  --   --   ?PLT 155 140* 166  --   --   ? ? ? ? ? ?

## 2021-11-24 DIAGNOSIS — I5020 Unspecified systolic (congestive) heart failure: Secondary | ICD-10-CM | POA: Diagnosis not present

## 2021-11-24 DIAGNOSIS — Z7189 Other specified counseling: Secondary | ICD-10-CM

## 2021-11-24 DIAGNOSIS — R55 Syncope and collapse: Secondary | ICD-10-CM | POA: Diagnosis not present

## 2021-11-24 DIAGNOSIS — I9589 Other hypotension: Secondary | ICD-10-CM | POA: Diagnosis not present

## 2021-11-24 DIAGNOSIS — I739 Peripheral vascular disease, unspecified: Secondary | ICD-10-CM | POA: Diagnosis not present

## 2021-11-24 DIAGNOSIS — Z515 Encounter for palliative care: Secondary | ICD-10-CM | POA: Diagnosis not present

## 2021-11-24 DIAGNOSIS — I350 Nonrheumatic aortic (valve) stenosis: Secondary | ICD-10-CM | POA: Diagnosis not present

## 2021-11-24 DIAGNOSIS — N186 End stage renal disease: Secondary | ICD-10-CM | POA: Diagnosis not present

## 2021-11-24 LAB — BASIC METABOLIC PANEL
Anion gap: 11 (ref 5–15)
BUN: 38 mg/dL — ABNORMAL HIGH (ref 8–23)
CO2: 28 mmol/L (ref 22–32)
Calcium: 8.6 mg/dL — ABNORMAL LOW (ref 8.9–10.3)
Chloride: 97 mmol/L — ABNORMAL LOW (ref 98–111)
Creatinine, Ser: 5 mg/dL — ABNORMAL HIGH (ref 0.61–1.24)
GFR, Estimated: 12 mL/min — ABNORMAL LOW (ref >60)
Glucose, Bld: 139 mg/dL — ABNORMAL HIGH (ref 70–99)
Potassium: 3.8 mmol/L (ref 3.5–5.1)
Sodium: 136 mmol/L (ref 135–145)

## 2021-11-24 LAB — CULTURE, BLOOD (ROUTINE X 2)
Culture: NO GROWTH
Culture: NO GROWTH
Special Requests: ADEQUATE
Special Requests: ADEQUATE

## 2021-11-24 LAB — CBC
HCT: 32.6 % — ABNORMAL LOW (ref 39.0–52.0)
Hemoglobin: 10.6 g/dL — ABNORMAL LOW (ref 13.0–17.0)
MCH: 32.3 pg (ref 26.0–34.0)
MCHC: 32.5 g/dL (ref 30.0–36.0)
MCV: 99.4 fL (ref 80.0–100.0)
Platelets: 125 10*3/uL — ABNORMAL LOW (ref 150–400)
RBC: 3.28 MIL/uL — ABNORMAL LOW (ref 4.22–5.81)
RDW: 18.4 % — ABNORMAL HIGH (ref 11.5–15.5)
WBC: 7.4 10*3/uL (ref 4.0–10.5)
nRBC: 0.3 % — ABNORMAL HIGH (ref 0.0–0.2)

## 2021-11-24 LAB — GLUCOSE, CAPILLARY
Glucose-Capillary: 124 mg/dL — ABNORMAL HIGH (ref 70–99)
Glucose-Capillary: 160 mg/dL — ABNORMAL HIGH (ref 70–99)
Glucose-Capillary: 227 mg/dL — ABNORMAL HIGH (ref 70–99)

## 2021-11-24 MED ORDER — CALCITRIOL 0.25 MCG PO CAPS: 0.2500 ug | ORAL_CAPSULE | ORAL | 1 refills | Status: AC

## 2021-11-24 MED ORDER — ISOSORBIDE MONONITRATE ER 30 MG PO TB24
15.0000 mg | ORAL_TABLET | Freq: Every day | ORAL | 1 refills | Status: AC
Start: 2021-11-25 — End: ?

## 2021-11-24 MED ORDER — CALCITRIOL 0.25 MCG PO CAPS: 0.2500 ug | ORAL_CAPSULE | ORAL | Status: DC

## 2021-11-24 MED ORDER — METOPROLOL TARTRATE 25 MG PO TABS
12.5000 mg | ORAL_TABLET | Freq: Two times a day (BID) | ORAL | 1 refills | Status: AC
Start: 2021-11-24 — End: ?

## 2021-11-24 MED ORDER — ACETAMINOPHEN 325 MG PO TABS: 650.0000 mg | ORAL_TABLET | Freq: Four times a day (QID) | ORAL | Status: DC | PRN

## 2021-11-24 NOTE — TOC Progression Note (Signed)
Transition of Care (TOC) - Progression Note  ? ? ?Patient Details  ?Name: [name] ?MRN: [id number] ?Date of Birth: 1951-04-20 ? ?Transition of Care (TOC) CM/SW Contact  ?Zenon Mayo, RN ?Phone Number: ?11/24/2021, 11:43 AM ? ?Clinical Narrative:    ?Patient is active with [facility] for [facility] per Malachy Mood with [facility]. ? ? ?  ?Barriers to Discharge: Continued Medical Work up ? ?Expected Discharge Plan and Services ?  ?  ?  ?  ?  ?Expected Discharge Date: 11/20/21               ?  ?  ?  ?  ?  ?  ?  ?  ?  ?  ? ? ?Social Determinants of Health (SDOH) Interventions ?  ? ?Readmission Risk Interventions ? ?  04/07/2020  ? 12:15 PM 05/01/2019  ?  2:07 PM  ?Readmission Risk Prevention Plan  ?Post Dischage Appt Complete   ?Medication Screening Complete   ?Transportation Screening Complete Complete  ?Medication Review Press photographer)  Complete  ?PCP or Specialist appointment within 3-5 days of discharge  Complete  ?[place] or Home Care Consult  Complete  ?SW Recovery Care/Counseling Consult  Complete  ?Palliative Care Screening  Not Applicable  ?[place]  Not Applicable  ? ? ?

## 2021-11-24 NOTE — Discharge Summary (Signed)
?DISCHARGE SUMMARY ? ?[name] ? ?MR#: [id number] ? ?DOB:1951/02/18  ?Date of Admission: 11/19/2021 ?Date of Discharge: 11/24/2021 ? ?Attending Physician:Suzana Sohail Hennie Duos, MD ? ?Patient's RFX:JOITGP, Mitzie Na, MD ? ?Consults: ?Cardiology ?Nephrology ?Palliative Care  ? ?Disposition: D/C home  ? ?Follow-up Appts: ? Follow-up Information   ? ? Caryl Bis, MD Follow up.   ?Specialty: Family Medicine ?Contact information: ?[address] ?[place] [address] ?[phone] ? ? ?  ?  ? ? Erma Heritage, PA-C Follow up on 12/21/2021.   ?Specialties: Physician Assistant, Cardiology ?Why: at 2:30 pm with [place] Dr. McDowell's PA. ?Contact information: ?[address] ?[place] [address] ?[phone] ? ? ?  ?  ? ? [facility] Follow up.   ?Why: outpatient palliative services ?Contact information: ?[address] ?[place] ?[phone] ? ?  ?  ? ? [facility] Follow up.   ?Specialty: Home Health Services ?Why: HHRN,HHPT- Agency will contact you apt times ?Contact information: ?[address] ?[place] [address] ?[phone] ? ? ?  ?  ? ?  ?  ? ?  ? ? ?Discharge Diagnoses: ?Severe aortic stenosis with syncope ?CAD - severe inoperable 2 vessel disease  ?Newly diagnosed severe systolic congestive heart failure ?Hypotension ?DM2 ?Hypothyroidism ?Peripheral vascular disease status post right AKA ?ESRD on HD TTS ?NO CODE BLUE - DNR ? ?Initial presentation: ?71 year old with a history of ESRD on HD TTS, COPD, CAD, HTN, PVD status post right AKA, chronic left lower extremity wound, and DM2 who presented to the ED 11/19/21 after suffering 2 separate syncopal spells with no prodrome.  He was transported to [facility] ED via EMS and was found to have a blood pressure of 58/40 and was minimally responsive.  Volume resuscitation at 1.5 L resulted in improvement in blood pressure and mentation.  TTE revealed severe aortic stenosis compared to mild aortic stenosis on previous  TTE 2021.  LV systolic dysfunction with EF of 30-35% was also appreciated.  The patient was subsequently transferred to [facility] for a cardiac cath. ? ?Hospital Course: ? ?Severe aortic stenosis with syncope ?Cardiology has evaluated - not felt to be a candidate for open heart valve surgery or CABG - medical management only option, with patient thus far tolerating nitrate and beta-blocker ?  ?CAD ?Cardiac cath 4/11 revealed severe two-vessel obstructive CAD with 90% stenosis of the mid LAD and severe calcification of all vessels - not a candidate for intervention or surgery -medical management is the only available option ?  ?Newly diagnosed severe systolic congestive heart failure ?TTE 11/20/2021 noted EF 30-35% - volume management per HD - likely due to CAD  ?  ?Possible sepsis -possible pneumonia - ruled out clinically  ?Findings explained by severe aortic stenosis - discontinued antibiotic -no persisting evidence to suggest acute infection ?  ?Hypotension ?explained by severe aortic stenosis -patient will be very much dependent upon appropriate volume balance -blood pressure stable at time of discharge ?  ?DM2 ?CBG reasonably controlled during this admission -primary goal is to avoid extremes ?  ?Hypothyroidism ?Continue usual Synthroid dose ?  ?Peripheral vascular disease status post right AKA ?Continue statin ?  ?ESRD on HD TTS ?Left upper extremity aVF - nephrology following and attending to dialysis ?  ?Disposition ?Palliative Care consulted to assist with goals of care and facilitate discharge home with in-home assistance -patient is officially no CODE BLUE at this time-outpatient/home palliative care to follow -to continue with home  health PT/OT to maximize quality of life and mobility -arrangements being made to assist family with transportation to dialysis ?  ? ?Allergies as of 11/24/2021   ? ?   Reactions  ? Ace Inhibitors   ? Unknown reaction, tolerates lisinopril   ? Clonidine   ? Unknown  reaction   ? Morphine And Related Other (See Comments)  ? Not effective  ? Oxytocin   ? unknown  ? ?  ? ?  ?Medication List  ?  ? ?STOP taking these medications   ? ?furosemide 80 MG tablet ?Commonly known as: LASIX ?  ?lisinopril 20 MG tablet ?Commonly known as: ZESTRIL ?  ?NONFORMULARY OR COMPOUNDED ITEM ?  ?prochlorperazine 25 MG suppository ?Commonly known as: COMPAZINE ?  ? ?  ? ?TAKE these medications   ? ?acetaminophen 325 MG tablet ?Commonly known as: TYLENOL ?Take 2 tablets (650 mg total) by mouth every 6 (six) hours as needed for mild pain (or Fever >/= 101). ?  ?albuterol 108 (90 Base) MCG/ACT inhaler ?Commonly known as: VENTOLIN HFA ?Inhale 2 puffs into the lungs every 6 (six) hours as needed for wheezing or shortness of breath. ?  ?ALPRAZolam 0.5 MG tablet ?Commonly known as: Duanne Moron ?Take 0.5 mg by mouth 2 (two) times daily as needed for anxiety. ?  ?ALPRAZolam 0.5 MG tablet ?Commonly known as: Duanne Moron ?Take by mouth. ?  ?ARTIFICIAL TEAR OP ?Place 1 drop into both eyes every 6 (six) hours as needed (dry eyes). ?  ?atorvastatin 80 MG tablet ?Commonly known as: LIPITOR ?Take 1 tablet (80 mg total) by mouth at bedtime. ?What changed: when to take this ?  ?bismuth subsalicylate 024 OX/73ZH suspension ?Commonly known as: PEPTO BISMOL ?Take 30 mLs by mouth every 6 (six) hours as needed for indigestion. ?  ?calcitRIOL 0.25 MCG capsule ?Commonly known as: ROCALTROL ?Take 1 capsule (0.25 mcg total) by mouth every Tuesday, Thursday, and Saturday at 6 PM. ?Start taking on: November 25, 2021 ?  ?calcium acetate 667 MG capsule ?Commonly known as: PHOSLO ?Take 4 capsules (2,668 mg total) by mouth 3 (three) times daily with meals. ?What changed:  ?how much to take ?when to take this ?additional instructions ?  ?cinacalcet 30 MG tablet ?Commonly known as: SENSIPAR ?Take 1 tablet (30 mg total) by mouth every evening. ?  ?clopidogrel 75 MG tablet ?Commonly known as: PLAVIX ?Take 1 tablet (75 mg total) by mouth daily. ?   ?diphenhydrAMINE 25 MG tablet ?Commonly known as: BENADRYL ?Take 25 mg by mouth daily as needed for itching. ?  ?Global Ease Inject Pen Needles 32G X 4 MM Misc ?Generic drug: Insulin Pen Needle ?TO BE USED DAILY WITH INSULIN ?  ?ibuprofen 200 MG tablet ?Commonly known as: ADVIL ?Take 400 mg by mouth in the morning and at bedtime. ?  ?isosorbide mononitrate 30 MG 24 hr tablet ?Commonly known as: IMDUR ?Take 0.5 tablets (15 mg total) by mouth daily. ?Start taking on: November 25, 2021 ?  ?levothyroxine 150 MCG tablet ?Commonly known as: SYNTHROID ?Take 150 mcg by mouth daily. ?What changed: Another medication with the same name was removed. Continue taking this medication, and follow the directions you see here. ?  ?metoprolol tartrate 25 MG tablet ?Commonly known as: LOPRESSOR ?Take 0.5 tablets (12.5 mg total) by mouth 2 (two) times daily. ?  ?midodrine 10 MG tablet ?Commonly known as: PROAMATINE ?Take 10-20 mg by mouth See admin instructions. On Dialysis days ?  ?multivitamin Tabs tablet ?Take 1 tablet by mouth daily. ?  ?  ondansetron 8 MG tablet ?Commonly known as: ZOFRAN ?Take 8 mg by mouth 3 (three) times daily as needed. ?  ?Oxycodone HCl 10 MG Tabs ?Take 10 mg by mouth 3 (three) times daily. ?  ?pantoprazole 40 MG tablet ?Commonly known as: PROTONIX ?Take 40 mg by mouth daily as needed (acid reflux). ?  ?polyethylene glycol powder 17 GM/SCOOP powder ?Commonly known as: GLYCOLAX/MIRALAX ?Take by mouth. ?  ?pregabalin 75 MG capsule ?Commonly known as: LYRICA ?Take 2 capsules (150 mg total) by mouth daily. ?What changed:  ?medication strength ?when to take this ?  ?senna 8.6 MG tablet ?Commonly known as: SENOKOT ?Take 1 tablet by mouth as needed for constipation. ?  ?Toujeo SoloStar 300 UNIT/ML Solostar Pen ?Generic drug: insulin glargine (1 Unit Dial) ?Inject 10-15 Units into the skin at bedtime as needed (if blood sugar is 130 or higher). ?  ? ?  ? ? ?Day of Discharge ?BP 123/71 (BP Location: Right Arm)   Pulse  (!) 50   Temp 97.9 ?F (36.6 ?C) (Oral)   Resp 14   Ht '6\' 4"'$  (1.93 m)   Wt 104.8 kg   SpO2 98%   BMI 28.12 kg/m?  ? ?Physical Exam: ?General: No acute respiratory distress ?Lungs: Clear to auscultation bilaterally w

## 2021-11-24 NOTE — TOC Transition Note (Signed)
Transition of Care (TOC) - CM/SW Discharge Note ? ? ?Patient Details  ?Name: [name] ?MRN: [id number] ?Date of Birth: Sep 22, 1950 ? ?Transition of Care (TOC) CM/SW Contact:  ?Zenon Mayo, RN ?Phone Number: ?11/24/2021, 3:32 PM ? ? ?Clinical Narrative:    ?Patient is for dc today, he  requested to see the NCM,  he states he was with [facility] for [facility] services , but states he would like to change because the therapist were not coming out. NCM offered choice from Medicare.gov list, he chose [facility], NCM made referral to Amy, she is able to take rerferral for HHRN, HHPT.  Soc will begin 24 to 48 hrs post dc.  NCM also offered choice for outpatient palliative services, they chose [facility], which works with Lonia Chimera, NCM informed patient of this information and he is ok with [facility].  NCM made referral to [facility] with [facility], she received the referral.  Patient has transport home today, family members at the bedside.   ? ? ?Final next level of care: [place] ?Barriers to Discharge: No Barriers Identified ? ? ?Patient Goals and CMS Choice ?Patient states their goals for this hospitalization and ongoing recovery are:: return home with [facility] ?CMS Medicare.gov Compare Post Acute Care list provided to:: Patient ?Choice offered to / list presented to : Patient ? ?Discharge Placement ?  ?           ?  ?  ?  ?  ? ?Discharge Plan and Services ?  ?Discharge Planning Services: CM Consult ?Post Acute Care Choice: Home Health          ?  ?DME Agency: NA ?  ?  ?  ?HH Arranged: Therapist, sports, PT ?[place] Agency: [place] ?Date HH Agency Contacted: 11/24/21 ?Time [place]: [id number] ?Representative spoke with at [place]: [place] ? ?Social Determinants of Health (SDOH) Interventions ?  ? ? ?Readmission Risk Interventions ? ?  04/07/2020  ? 12:15 PM 05/01/2019  ?  2:07 PM  ?Readmission Risk Prevention Plan  ?Post Dischage Appt Complete   ?Medication Screening Complete   ?Transportation Screening  Complete Complete  ?Medication Review Press photographer)  Complete  ?PCP or Specialist appointment within 3-5 days of discharge  Complete  ?[place] or Home Care Consult  Complete  ?SW Recovery Care/Counseling Consult  Complete  ?Palliative Care Screening  Not Applicable  ?[place]  Not Applicable  ? ? ? ? ? ?

## 2021-11-24 NOTE — Progress Notes (Signed)
Telemetry stated patient had 8 beats of Vtach before returning back to SB. Patient talking to family at bedside. Will continue to monitor.  ?

## 2021-11-24 NOTE — Progress Notes (Signed)
RN walked into room to place DNR armband. Per patient " I don't want to put that on right now. I need to discuss that with the doctor. I have not made a decision yet." ? ? ?

## 2021-11-24 NOTE — Care Management Important Message (Signed)
Important Message ? ?Patient Details  ?Name: [name] ?MRN: [id number] ?Date of Birth: 20-May-1951 ? ? ?Medicare Important Message Given:  Yes ? ? ? ? ?Shelda Altes ?11/24/2021, 11:17 AM ?

## 2021-11-24 NOTE — Social Work (Signed)
CSW spoke with family in room about transportation to and from dialysis, CSW completed transportation form and received an e-mail stating that pt was pr-certified for transport. Pt will DC today and transport will follow up with pt and family. CSW also provided transport resources to the family along with the original copy of the transportation form.  ?

## 2021-11-24 NOTE — Progress Notes (Addendum)
MD plans to d/c pt this afternoon to home. Contacted [facility] and spoke to [place], Therapist, sports. Clinic advised pt will d/c to home today and will resume at clinic tomorrow. Clinic requesting that d/c summary be faxed for continuation of care. Will fax summary once available.  ? ?[name] ?Renal Navigator ?[phone] ? ?Addendum at 3:52 pm: ?D/C summary and last renal note faxed to [facility] this afternoon (fax#[phone]).  ?

## 2021-11-24 NOTE — Progress Notes (Signed)
Nephrology Follow-Up Consult note ? ? ?  ?  ?Assessment/Recommendations: [name] is a/an 71 y.o. male with a past medical history notable for ESRD on HD admitted with syncope.  ? ?Outpatient dialysis unit: [facility] ?Outpatient dialysis schedule: TTS ?Script: 2K, 2.5 Ca, EDW 106, DFR 500, 4.5hrs, NIPRO, AVF, heparin 3500 units loading then 2000 mid treatment, sensipar '30mg'$  TTS, calcitriol 0.59mg TTS ?  ?# ESRD: Continue HD per tts schedule ?# Volume/ hypertension: EDW 106kg, total body volume overload chronic. Volume removal as tolerated; limited by hypotension ?# Anemia of Chronic Kidney Disease: Hemoglobin 10.6. Currently receiving no esa.  ?# Secondary Hyperparathyroidism/Hyperphosphatemia: Continue home sensipar and calcitriol. Continue home binders ?# Vascular access: AVF with no issues ?# Syncope: Concerning for cardiac etiology with newly low EF. Had LHC and RHC on 4/11 w/ multivessel CAD, decompensated CHF, and pHTN.  Options regarding interventions are fairly limited.  Palliative care involved. ?  ?# Additional recommendations: ?- Dose all meds for creatinine clearance < 10 ml/min  ?- Unless absolutely necessary, no MRIs with gadolinium.  ?- Implement save arm precautions.  Prefer needle sticks in the dorsum of the hands or wrists.  No blood pressure measurements in arm. ?- If blood transfusion is requested during hemodialysis sessions, please alert uKoreaprior to the session.  ?- Use synthetic opioids (Fentanyl/Dilaudid) if needed ?  ?Recommendations were discussed with the primary team. ?  ? ? ?Recommendations conveyed to primary service.  ? ? ?SReesa Chew?[facility] ?11/24/2021 ?9:28 AM ? ?___________________________________________________________ ? ?CC: syncope ? ?Interval History/Subjective: Patient with no complaints. HD w/ 2L further UF limited by hypotension ? ? ?Medications:  ?Current Facility-Administered Medications  ?Medication Dose Route Frequency Provider Last  Rate Last Admin  ? 0.9 %  sodium chloride infusion  100 mL Intravenous PRN JMartinique Peter M, MD      ? 0.9 %  sodium chloride infusion  100 mL Intravenous PRN JMartinique Peter M, MD      ? acetaminophen (TYLENOL) tablet 650 mg  650 mg Oral Q6H PRN JMartinique Peter M, MD      ? albuterol (PROVENTIL) (2.5 MG/3ML) 0.083% nebulizer solution 3 mL  3 mL Inhalation Q6H PRN JMartinique Peter M, MD      ? ALPRAZolam (Duanne Moron tablet 0.5 mg  0.5 mg Oral BID PRN JMartinique Peter M, MD   0.5 mg at 11/23/21 2214  ? alteplase (CATHFLO ACTIVASE) injection 2 mg  2 mg Intracatheter Once PRN JMartinique Peter M, MD      ? atorvastatin (LIPITOR) tablet 80 mg  80 mg Oral Daily JMartinique Peter M, MD   80 mg at 11/24/21 00947 ? calcitRIOL (ROCALTROL) capsule 0.25 mcg  0.25 mcg Oral Q T,Th,Sat-1800 JMartinique Peter M, MD   0.25 mcg at 11/23/21 1748  ? calcium acetate (PHOSLO) capsule 1,334 mg  1,334 mg Oral TID with meals JMartinique Peter M, MD   1,334 mg at 11/24/21 00962 ? calcium acetate (PHOSLO) capsule 667 mg  667 mg Oral PRN JMartinique Peter M, MD      ? Chlorhexidine Gluconate Cloth 2 % PADS 6 each  6 each Topical Q0600 JMartinique Peter M, MD   6 each at 11/23/21 [phone] ? cinacalcet (SENSIPAR) tablet 30 mg  30 mg Oral Q T,Th,Sat-1800 JMartinique Peter M, MD   30 mg at 11/23/21 1748  ? clopidogrel (PLAVIX) tablet 75 mg  75 mg Oral Daily JMartinique Peter M, MD   75 mg at 11/24/21  1610  ? diphenhydrAMINE (BENADRYL) capsule 25 mg  25 mg Oral Daily PRN Martinique, Peter M, MD      ? heparin injection 1,000 Units  1,000 Units Dialysis PRN Martinique, Peter M, MD   1,000 Units at 11/23/21 1142  ? heparin injection 5,000 Units  5,000 Units Subcutaneous Q8H Martinique, Peter M, MD   5,000 Units at 11/24/21 9604  ? hydrocortisone cream 1 %   Topical BID PRN Martinique, Peter M, MD   Given at 11/21/21 0004  ? insulin aspart (novoLOG) injection 0-5 Units  0-5 Units Subcutaneous QHS Martinique, Peter M, MD      ? insulin aspart (novoLOG) injection 0-6 Units  0-6 Units Subcutaneous TID WC Martinique, Peter M,  MD   1 Units at 11/22/21 1706  ? isosorbide mononitrate (IMDUR) 24 hr tablet 15 mg  15 mg Oral Daily Troy Sine, MD   15 mg at 11/24/21 5409  ? levothyroxine (SYNTHROID) tablet 150 mcg  150 mcg Oral Q0600 Martinique, Peter M, MD   150 mcg at 11/24/21 8119  ? lidocaine (PF) (XYLOCAINE) 1 % injection 5 mL  5 mL Intradermal PRN Martinique, Peter M, MD      ? lidocaine-prilocaine (EMLA) cream 1 application.  1 application. Topical PRN Martinique, Peter M, MD      ? metoprolol tartrate (LOPRESSOR) tablet 12.5 mg  12.5 mg Oral BID Troy Sine, MD   12.5 mg at 11/24/21 1478  ? midodrine (PROAMATINE) tablet 10 mg  10 mg Oral TID WC Martinique, Peter M, MD   10 mg at 11/24/21 2956  ? multivitamin (RENA-VIT) tablet 1 tablet  1 tablet Oral Daily Martinique, Peter M, MD   1 tablet at 11/24/21 2130  ? ondansetron (ZOFRAN) tablet 4 mg  4 mg Oral Q6H PRN Martinique, Peter M, MD      ? Or  ? ondansetron [facility]) injection 4 mg  4 mg Intravenous Q6H PRN Martinique, Peter M, MD      ? oxyCODONE (Oxy IR/ROXICODONE) immediate release tablet 5 mg  5 mg Oral Q4H PRN Martinique, Peter M, MD   5 mg at 11/23/21 2214  ? pantoprazole (PROTONIX) EC tablet 40 mg  40 mg Oral Daily PRN Martinique, Peter M, MD   40 mg at 11/20/21 2124  ? pentafluoroprop-tetrafluoroeth (GEBAUERS) aerosol 1 application.  1 application. Topical PRN Martinique, Peter M, MD      ? pregabalin (LYRICA) capsule 150 mg  150 mg Oral TID Martinique, Peter M, MD   150 mg at 11/24/21 8657  ? senna (SENOKOT) tablet 8.6 mg  1 tablet Oral PRN Martinique, Peter M, MD      ? sodium chloride flush (NS) 0.9 % injection 3 mL  3 mL Intravenous Q12H Martinique, Peter M, MD   3 mL at 11/23/21 2225  ?  ? ? ?Review of Systems: ?10 systems reviewed and negative except per interval history/subjective ? ?Physical Exam: ?Vitals:  ? 11/24/21 0400 11/24/21 0800  ?BP: 119/68 104/69  ?Pulse: (!) 50 (!) 51  ?Resp: 15 14  ?Temp: 97.7 ?F (36.5 ?C) 97.7 ?F (36.5 ?C)  ?SpO2: 93% 94%  ? ?No intake/output data recorded. ? ?Intake/Output Summary  (Last 24 hours) at 11/24/2021 0928 ?Last data filed at 11/23/2021 1825 ?Gross per 24 hour  ?Intake 360 ml  ?Output 2100 ml  ?Net -1740 ml  ? ?General: well-appearing, no acute distress ?HEENT: anicteric sclera, MMM ?CV: normal rate, no murmurs, 2+ pitting edema of the left lower  extremity ?Lungs: bilateral chest rise, normal wob ?Abd: soft, non-tender, non-distended ?Skin: Excoriations present in the left lower extremity, Otherwise no visible lesions or rashes ?Neuro: normal speech, no gross focal deficits  ? ? ?Test Results ?I personally reviewed new and old clinical labs and radiology tests ?Lab Results  ?Component Value Date  ? NA 136 11/24/2021  ? K 3.8 11/24/2021  ? CL 97 (L) 11/24/2021  ? CO2 28 11/24/2021  ? BUN 38 (H) 11/24/2021  ? CREATININE 5.00 (H) 11/24/2021  ? CALCIUM 8.6 (L) 11/24/2021  ? ALBUMIN 2.7 (L) 11/23/2021  ? PHOS 8.2 (H) 11/23/2021  ? ? ?CBC ?Recent Labs  ?Lab 11/19/21 ?1401 11/20/21 ?5537 11/21/21 ?4827 11/21/21 ?1615 11/21/21 ?1622 11/23/21 ?0786 11/24/21 ?7544  ?WBC 5.6 6.1 8.2  --   --  8.0 7.4  ?NEUTROABS 3.9 3.6  --   --   --   --   --   ?HGB 8.7* 10.2* 10.8*   < > 10.5*  10.5* 10.6* 10.6*  ?HCT 27.4* 32.8* 32.6*   < > 31.0*  31.0* 33.1* 32.6*  ?MCV 101.5* 100.9* 98.5  --   --  100.0 99.4  ?PLT 155 140* 166  --   --  151 125*  ? < > = values in this interval not displayed.  ? ? ? ? ? ?

## 2021-11-24 NOTE — Progress Notes (Signed)
? ?Progress Note ? ?Patient Name: [name] ?Date of Encounter: 11/24/2021 ? ?[facility] Cardiologist: Rozann Lesches, MD  ? ?Subjective  ? ?No chest pain or shortness of breath ? ?Inpatient Medications  ?  ?Scheduled Meds: ? atorvastatin  80 mg Oral Daily  ? calcitRIOL  0.25 mcg Oral Q T,Th,Sat-1800  ? calcium acetate  1,334 mg Oral TID with meals  ? Chlorhexidine Gluconate Cloth  6 each Topical Q0600  ? cinacalcet  30 mg Oral Q T,Th,Sat-1800  ? clopidogrel  75 mg Oral Daily  ? heparin  5,000 Units Subcutaneous Q8H  ? insulin aspart  0-5 Units Subcutaneous QHS  ? insulin aspart  0-6 Units Subcutaneous TID WC  ? isosorbide mononitrate  15 mg Oral Daily  ? levothyroxine  150 mcg Oral Q0600  ? metoprolol tartrate  12.5 mg Oral BID  ? midodrine  10 mg Oral TID WC  ? multivitamin  1 tablet Oral Daily  ? pregabalin  150 mg Oral TID  ? sodium chloride flush  3 mL Intravenous Q12H  ? ?Continuous Infusions: ? sodium chloride    ? sodium chloride    ? ?PRN Meds: ?sodium chloride, sodium chloride, acetaminophen **OR** [DISCONTINUED] acetaminophen, albuterol, ALPRAZolam, alteplase, calcium acetate, diphenhydrAMINE, heparin, hydrocortisone cream, lidocaine (PF), lidocaine-prilocaine, ondansetron **OR** ondansetron (ZOFRAN) IV, oxyCODONE, pantoprazole, pentafluoroprop-tetrafluoroeth, senna  ? ?Vital Signs  ?  ?Vitals:  ? 11/23/21 2246 11/24/21 0400 11/24/21 0633 11/24/21 0800  ?BP:  119/68  104/69  ?Pulse: (!) 55 (!) 50  (!) 51  ?Resp: '14 15  14  '$ ?Temp:  97.7 ?F (36.5 ?C)  97.7 ?F (36.5 ?C)  ?TempSrc:  Axillary  Oral  ?SpO2: 95% 93%  94%  ?Weight:   104.8 kg   ?Height:      ? ? ?Intake/Output Summary (Last 24 hours) at 11/24/2021 1100 ?Last data filed at 11/24/2021 1042 ?Gross per 24 hour  ?Intake 480 ml  ?Output 2100 ml  ?Net -1620 ml  ? ? ?  11/24/2021  ?  6:33 AM 11/23/2021  ?  2:51 PM 11/23/2021  ?  9:17 AM  ?Last 3 Weights  ?Weight (lbs) 231 lb 0.7 oz 231 lb 0.7 oz 236 lb 15.9 oz  ?Weight (kg) 104.8 kg 104.8 kg 107.5  kg  ?   ? ?Telemetry  ?  ?SR to SB at 91 briefly - Personally Reviewed ? ?ECG  ?  ?No new - Personally Reviewed ? ?Physical Exam  ? ?GEN: No acute distress.   ?Neck: No JVD ?Cardiac: 2/6 aortic stenosis murmur ?Respiratory: Clear to auscultation bilaterally. ?GI: Soft, nontender, non-distended  ?MS: Right AKA, distended left lower extremity bandaged with multiple sitesbruising ?Neuro:  Nonfocal  ?Psych: Normal affect  ? ?Labs  ?  ?High Sensitivity Troponin:  No results for input(s): TROPONINIHS in the last 720 hours.   ?Chemistry ?Recent Labs  ?Lab 11/19/21 ?1401 11/20/21 ?0865 11/21/21 ?0734 11/21/21 ?1615 11/21/21 ?1622 11/23/21 ?7846 11/24/21 ?9629  ?NA 139 138 139   < > 138  138 139 136  ?K 4.3 4.9 5.1   < > 3.9  3.9 5.4* 3.8  ?CL 102 102 105  --   --  101 97*  ?CO2 23 23 20*  --   --  25 28  ?GLUCOSE 135* 138* 92  --   --  130* 139*  ?BUN 38* 45* 57*  --   --  56* 38*  ?CREATININE 4.39* 5.29* 6.58*  --   --  6.54* 5.00*  ?  CALCIUM 8.9 9.4 9.4  --   --  9.3 8.6*  ?MG  --  1.8  --   --   --   --   --   ?PROT 5.8* 6.1*  --   --   --   --   --   ?ALBUMIN 2.7* 2.8* 2.7*  --   --  2.7*  --   ?AST 74* 84*  --   --   --   --   --   ?ALT 47* 50*  --   --   --   --   --   ?ALKPHOS 188* 185*  --   --   --   --   --   ?BILITOT 0.7 0.8  --   --   --   --   --   ?GFRNONAA 14* 11* 8*  --   --  8* 12*  ?ANIONGAP '14 13 14  '$ --   --  13 11  ? < > = values in this interval not displayed.  ?  ?Lipids No results for input(s): CHOL, TRIG, HDL, LABVLDL, LDLCALC, CHOLHDL in the last 168 hours.  ?Hematology ?Recent Labs  ?Lab 11/21/21 ?0733 11/21/21 ?1615 11/21/21 ?1622 11/23/21 ?1779 11/24/21 ?3903  ?WBC 8.2  --   --  8.0 7.4  ?RBC 3.31*  --   --  3.31* 3.28*  ?HGB 10.8*   < > 10.5*  10.5* 10.6* 10.6*  ?HCT 32.6*   < > 31.0*  31.0* 33.1* 32.6*  ?MCV 98.5  --   --  100.0 99.4  ?MCH 32.6  --   --  32.0 32.3  ?MCHC 33.1  --   --  32.0 32.5  ?RDW 18.3*  --   --  18.3* 18.4*  ?PLT 166  --   --  151 125*  ? < > = values in this interval  not displayed.  ? ?Thyroid No results for input(s): TSH, FREET4 in the last 168 hours.  ?BNPNo results for input(s): BNP, PROBNP in the last 168 hours.  ?DDimer No results for input(s): DDIMER in the last 168 hours.  ? ?Radiology  ?  ?No results found. ? ?Cardiac Studies  ? ?  ?Cath: 11/21/21 ?  ?  Prox LAD lesion is 50% stenosed. ?  Mid LAD lesion is 90% stenosed. ?  1st Diag lesion is 100% stenosed. ?  Prox RCA lesion is 100% stenosed. ?  Hemodynamic findings consistent with moderate pulmonary hypertension. ?  ?Severe 2 vessel obstructive CAD. The LAD has a 90% stenosis at the first septal perforator. The first diagonal appears to be occluded with collaterals. The RCA is occluded proximally with left to right collaterals. All vessels are severely calcified. ?Moderately elevated LV filling pressures. PCWP mean 20 mm Hg ?High RV filling pressures with RA pressure of 22 mm Hg ?Moderate to severe pulmonary HTN with mean PAP 44 mm Hg ?Cardiac index of 2.5 is probably low for someone with AV fistula. ?Severe PAD with difficult arterial access ?  ?Plan: will need heart team approach. Patient has significant CAD, aortic stenosis, decompensated CHF. Multiple co morbidities and difficult access.  ?  ?Diagnostic ?Dominance: Right ?Echo: 11/20/21 ?  ?IMPRESSIONS  ? ? ? 1. Left ventricular ejection fraction, by estimation, is 30 to 35%. The  ?left ventricle has moderately decreased function. The left ventricle  ?demonstrates global hypokinesis. The left ventricular internal cavity size  ?was mildly dilated. There is moderate  ? left ventricular hypertrophy.  Left ventricular diastolic parameters are  ?indeterminate.  ? 2. Right ventricular systolic function is moderately reduced. The right  ?ventricular size is mildly enlarged. There is mildly elevated pulmonary  ?artery systolic pressure.  ? 3. Left atrial size was moderately dilated.  ? 4. The mitral valve is degenerative. Mild mitral valve regurgitation. No  ?evidence of  mitral stenosis. Moderate mitral annular calcification.  ? 5. Severe low flow AS with moderately decreased EF. AS/decreased EF new  ?since 2021 . The aortic valve was not well visualized. There is severe  ?calcifcation of the aortic valve. There is severe thickening of the aortic  ?valve. Aortic valve regurgitation  ?is trivial. Severe aortic valve stenosis.  ? 6. The inferior vena cava is dilated in size with <50% respiratory  ?variability, suggesting right atrial pressure of 15 mmHg.  ? ?FINDINGS  ? Left Ventricle: Left ventricular ejection fraction, by estimation, is 30  ?to 35%. The left ventricle has moderately decreased function. The left  ?ventricle demonstrates global hypokinesis. The left ventricular internal  ?cavity size was mildly dilated.  ?There is moderate left ventricular hypertrophy. Left ventricular diastolic  ?parameters are indeterminate.  ? ?Right Ventricle: The right ventricular size is mildly enlarged. No  ?increase in right ventricular wall thickness. Right ventricular systolic  ?function is moderately reduced. There is mildly elevated pulmonary artery  ?systolic pressure. The tricuspid  ?regurgitant velocity is 2.54 m/s, and with an assumed right atrial  ?pressure of 15 mmHg, the estimated right ventricular systolic pressure is  ?94.5 mmHg.  ? ?Left Atrium: Left atrial size was moderately dilated.  ? ?Right Atrium: Right atrial size was normal in size.  ? ?Pericardium: There is no evidence of pericardial effusion.  ? ?Mitral Valve: The mitral valve is degenerative in appearance. There is  ?moderate thickening of the mitral valve leaflet(s). There is moderate  ?calcification of the mitral valve leaflet(s). Moderate mitral annular  ?calcification. Mild mitral valve  ?regurgitation. No evidence of mitral valve stenosis. MV peak gradient, 6.1  ?mmHg. The mean mitral valve gradient is 2.0 mmHg.  ? ?Tricuspid Valve: The tricuspid valve is normal in structure. Tricuspid  ?valve regurgitation is  mild . No evidence of tricuspid stenosis.  ? ?Aortic Valve: Severe low flow AS with moderately decreased EF.  ?AS/decreased EF new since 2021. The aortic valve was not well visualized.  ?There is severe calcifcati

## 2021-11-24 NOTE — Progress Notes (Signed)
[facility] ([facility])  Hospital Liaison: RN note         This patient has been referred to our palliative care services in the community.  [facility] will continue to follow for any discharge planning needs and to coordinate continuation of palliative care in the outpatient setting.    If you have questions or need assistance, please call [phone] or contact the hospital Liaison listed on AMION.      Thank you for this referral.         Mary Anne Robertson, RN, CCM  [facility] Hospital Liaison   [phone] 

## 2021-11-24 NOTE — Consult Note (Signed)
?Palliative Medicine Inpatient Consult Note ? ?Consulting Provider: Cherene Altes, MD ? ?Reason for consult:   ?[place] Palliative Medicine Consult  ?Reason for Consult? goals of care - assistance at home - acceptance of terminal diagnosis of AoS, CAD, CHF w/o treatment options  ? ?HPI:  ?Per intake H&P -->  ?71 year old with a history of ESRD on HD TTS, COPD, CAD, HTN, PVD status post right AKA, chronic left lower extremity wound, and DM2 who presented to the ED 4 9 after suffering 2 separate syncopal spells with no prodrome.  He was transported to the [facility], ED via EMS and was found to have a blood pressure 58/40 and was minimally responsive.  Volume resuscitation at 1.5 L resulted in improvement in blood pressure and mentation.  TTE revealed severe aortic stenosis compared to mild aortic stenosis on previous TTE 2021.  LV systolic dysfunction with EF of 30-35% was also appreciated.  The patient was subsequently transferred to [facility] for a planned cardiac cath. ? ?Palliative care asked to get involved in the setting of severe AS with no good treatment options.  ? ?Clinical Assessment/Goals of Care: ? ?*Please note that this is a verbal dictation therefore any spelling or grammatical errors are due to the "H[employer] One" system interpretation. ? ?I have reviewed medical records including EPIC notes, labs and imaging, received report from bedside RN, assessed the patient.  ?  ?I met with Knox to further discuss diagnosis prognosis, GOC, EOL wishes, disposition and options. ?  ?I introduced Palliative Medicine as specialized medical care for people living with serious illness. It focuses on providing relief from the symptoms and stress of a serious illness. The goal is to improve quality of life for both the patient and the family. ? ?Medical History Review and Understanding: ? ?Alexandra shared with me his complicated medication history dating back to 2013 when it  was realized he had severe kidney disease. He started HD in 2014. He shares that over the years he has suffered recurrent falls though in 2018 it got much worse, it was then that he was identified as needing to go to a skilled nursing facility, [facility] where he stayed for almost a year. He describes himself in almost a comatose state which one day he "snapped out of". He shares that he was doing fairly well until 2020 when his health started failing again.  ? ?Reviewed most recently he was told that his heart has awful plaque buildup which will eventually, "kill him". Reviewed further what aortic stenosis is and the symptoms he can expect as a result of this. He does appear aware that he can die at anytime due to this.  ? ?Social History: ? ?Jayvier is from [place], [place]. He has been married to his wife, [name] for 52 years. He has 2 daughters, and 2 grandchildren. 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 He is a man who shares in the setting of his chronic diseases does not enjoy much in life any longer. He shares that he has a faith in God though it is personal. He believe everywhere is "your temple." ? ?Functional and Nutritional State: ? ?Criston required a transfer board in and out of the car prior to admission. He shares that he can self scoot in the home into his wheelchair and can perform most bADLs. ? ?Johnathyn shares his appetite prior to admission was good some days and bad  some days.  ? ?Advance Directives: ? ?A detailed discussion was had today regarding advanced directives.  A cope of these were provided for review and completion.  ? ?Code Status: ? ?Encouraged patient/family to consider DNR/DNI status understanding evidenced based poor outcomes in similar hospitalized patient, as the cause of arrest is likely associated with advanced chronic/terminal illness rather than an easily reversible acute cardio-pulmonary event. I explained that DNR/DNI does not  change the medical plan and it only comes into effect after a person has arrested (died).  It is a protective measure to keep [place] from harming the patient in their last moments of life. Zaidin was agreeable to DNR/DNI with understanding that patient would not receive CPR, defibrillation, ACLS medications, or intubation.  ? ?Discussion: ? ?I asked Arne how he feels about what the medical team(s) have shared with him. He expresses that he does not want to believe that his disease process is as poor as it is though he will if he has to. I shared that I worry he may not live very long and I expressed that it may be worthwhile to consider hospice in the near future. He shares that he wants to continue with interventions such as dialysis and medical management. He expresses that he does not feel ready for hospice care presently. Reviewed that an OP Palliative provider would be of utility to continue speaking with him to offer support. ? ?Kayden expresses that he needs some social assistance with transporting to and from HD. He expresses that he otherwise feels he is overloading his wife who is also not in great health. I shared that I do not coordinate this though I will reach out to our [facility] team. ? ?Discussed the importance of continued conversation with family and their  medical providers regarding overall plan of care and treatment options, ensuring decisions are within the context of the patients values and GOCs. ? ?Decision Maker: ?Patient can make decisions for himself though if needed he would rely on his spouse, [name] to make decisions for him.  ? ?SUMMARY OF RECOMMENDATIONS   ?DNAR/DNI ? ?Open and honest conversation held regarding patients severe AS and high risk of mortality ? ?Advance Directives provided for completion ? ?TOC - OP Palliative support ? ?Ongoing incremental PMT support ? ?Code Status/Advance Care Planning: ?DNAR/DNI ? ?Palliative Prophylaxis:  ?Aspiration, Bowel Regimen, Delirium Protocol,  Frequent Pain Assessment, Oral Care, Palliative Wound Care, and Turn Reposition ? ?Additional Recommendations (Limitations, Scope, Preferences): ?Treat the treatable ? ?Psycho-social/Spiritual:  ?Desire for further Chaplaincy support: No ?Additional Recommendations: Education on AS and chronic disease burden ?  ?Prognosis: Poor given multiple chronic diease processes and worsening function ? ?Discharge Planning: Discharge home when medically optimized. ? ?Vitals:  ? 11/23/21 2246 11/24/21 0400  ?BP:  119/68  ?Pulse: (!) 55 (!) 50  ?Resp: 14 15  ?Temp:  97.7 ?F (36.5 ?C)  ?SpO2: 95% 93%  ? ? ?Intake/Output Summary (Last 24 hours) at 11/24/2021 0703 ?Last data filed at 11/23/2021 1825 ?Gross per 24 hour  ?Intake 480 ml  ?Output 2100 ml  ?Net -1620 ml  ? ?Last Weight  Most recent update: 11/24/2021  6:38 AM  ? ? Weight  ?104.8 kg (231 lb 0.7 oz)  ?      ? ?  ? ?Gen:  Elderly Chronically ill appearing caucasian M in NAD ?HEENT: moist mucous membranes ?CV: Regular rate and rhythm  ?PULM: ON RA breathing even and nonlabored ?ABD: soft/nontender ?EXT: R AKA, LLE wrapping in  place ?Neuro: Alert and oriented x3 ? ?PPS: ? ? ?This conversation/these recommendations were discussed with patient primary care team, Dr. Thereasa Solo ? ?[facility] ?______________________________________________________ ?Tacey Ruiz ?[place] Team ?Team Cell Phone: [phone] ?Please utilize secure chat with additional questions, if there is no response within 30 minutes please call the above phone number ? ?Palliative Medicine Team providers are available by phone from 7am to 7pm daily and can be reached through the team cell phone.  ?Should this patient require assistance outside of these hours, please call the patient's attending physician. ? ? ?

## 2021-11-24 NOTE — Progress Notes (Signed)
Patient requesting something to drink. Patient informed he is on a 1211m fluid restriction. Per patient " I will like to speak with someone about that" ?

## 2021-11-27 DIAGNOSIS — Z992 Dependence on renal dialysis: Secondary | ICD-10-CM | POA: Diagnosis not present

## 2021-11-27 DIAGNOSIS — N186 End stage renal disease: Secondary | ICD-10-CM | POA: Diagnosis not present

## 2021-11-27 DIAGNOSIS — D631 Anemia in chronic kidney disease: Secondary | ICD-10-CM | POA: Diagnosis not present

## 2021-11-27 DIAGNOSIS — N250 Renal osteodystrophy: Secondary | ICD-10-CM | POA: Diagnosis not present

## 2021-11-27 DIAGNOSIS — N2581 Secondary hyperparathyroidism of renal origin: Secondary | ICD-10-CM | POA: Diagnosis not present

## 2021-11-27 DIAGNOSIS — D509 Iron deficiency anemia, unspecified: Secondary | ICD-10-CM | POA: Diagnosis not present

## 2021-11-28 ENCOUNTER — Telehealth: Payer: Self-pay

## 2021-11-28 DIAGNOSIS — Z992 Dependence on renal dialysis: Secondary | ICD-10-CM | POA: Diagnosis not present

## 2021-11-28 DIAGNOSIS — N250 Renal osteodystrophy: Secondary | ICD-10-CM | POA: Diagnosis not present

## 2021-11-28 DIAGNOSIS — N186 End stage renal disease: Secondary | ICD-10-CM | POA: Diagnosis not present

## 2021-11-28 DIAGNOSIS — D509 Iron deficiency anemia, unspecified: Secondary | ICD-10-CM | POA: Diagnosis not present

## 2021-11-28 DIAGNOSIS — D631 Anemia in chronic kidney disease: Secondary | ICD-10-CM | POA: Diagnosis not present

## 2021-11-28 DIAGNOSIS — N2581 Secondary hyperparathyroidism of renal origin: Secondary | ICD-10-CM | POA: Diagnosis not present

## 2021-11-28 NOTE — Telephone Encounter (Signed)
Spoke with patient's spouse [name] and scheduled a Mychart Palliative Consult for 12/01/21 @ 2 PM.  ? ?Consent obtained; updated Netsmart, Team List and Epic.  ? ?

## 2021-11-29 ENCOUNTER — Other Ambulatory Visit: Payer: Self-pay | Admitting: Cardiology

## 2021-11-29 ENCOUNTER — Other Ambulatory Visit: Payer: Medicare Other

## 2021-11-29 DIAGNOSIS — I25119 Atherosclerotic heart disease of native coronary artery with unspecified angina pectoris: Secondary | ICD-10-CM | POA: Diagnosis not present

## 2021-11-29 DIAGNOSIS — I5022 Chronic systolic (congestive) heart failure: Secondary | ICD-10-CM | POA: Diagnosis not present

## 2021-11-29 DIAGNOSIS — I350 Nonrheumatic aortic (valve) stenosis: Secondary | ICD-10-CM | POA: Diagnosis not present

## 2021-11-30 DIAGNOSIS — N250 Renal osteodystrophy: Secondary | ICD-10-CM | POA: Diagnosis not present

## 2021-11-30 DIAGNOSIS — D631 Anemia in chronic kidney disease: Secondary | ICD-10-CM | POA: Diagnosis not present

## 2021-11-30 DIAGNOSIS — Z20822 Contact with and (suspected) exposure to covid-19: Secondary | ICD-10-CM | POA: Diagnosis not present

## 2021-11-30 DIAGNOSIS — D509 Iron deficiency anemia, unspecified: Secondary | ICD-10-CM | POA: Diagnosis not present

## 2021-11-30 DIAGNOSIS — Z992 Dependence on renal dialysis: Secondary | ICD-10-CM | POA: Diagnosis not present

## 2021-11-30 DIAGNOSIS — N186 End stage renal disease: Secondary | ICD-10-CM | POA: Diagnosis not present

## 2021-11-30 DIAGNOSIS — N2581 Secondary hyperparathyroidism of renal origin: Secondary | ICD-10-CM | POA: Diagnosis not present

## 2021-12-01 ENCOUNTER — Telehealth: Payer: Medicare Other | Admitting: Family Medicine

## 2021-12-02 DIAGNOSIS — D509 Iron deficiency anemia, unspecified: Secondary | ICD-10-CM | POA: Diagnosis not present

## 2021-12-02 DIAGNOSIS — N2581 Secondary hyperparathyroidism of renal origin: Secondary | ICD-10-CM | POA: Diagnosis not present

## 2021-12-02 DIAGNOSIS — D631 Anemia in chronic kidney disease: Secondary | ICD-10-CM | POA: Diagnosis not present

## 2021-12-02 DIAGNOSIS — N250 Renal osteodystrophy: Secondary | ICD-10-CM | POA: Diagnosis not present

## 2021-12-02 DIAGNOSIS — N186 End stage renal disease: Secondary | ICD-10-CM | POA: Diagnosis not present

## 2021-12-02 DIAGNOSIS — Z992 Dependence on renal dialysis: Secondary | ICD-10-CM | POA: Diagnosis not present

## 2021-12-03 ENCOUNTER — Emergency Department (HOSPITAL_COMMUNITY): Payer: Medicare Other

## 2021-12-03 ENCOUNTER — Other Ambulatory Visit: Payer: Self-pay

## 2021-12-03 ENCOUNTER — Inpatient Hospital Stay (HOSPITAL_COMMUNITY)
Admission: EM | Admit: 2021-12-03 | Discharge: 2021-12-07 | DRG: 291 | Disposition: A | Discharge status: Home Health | Payer: Medicare Other | Attending: Internal Medicine | Admitting: Internal Medicine

## 2021-12-03 ENCOUNTER — Encounter (HOSPITAL_COMMUNITY): Payer: Self-pay | Admitting: *Deleted

## 2021-12-03 DIAGNOSIS — I5021 Acute systolic (congestive) heart failure: Secondary | ICD-10-CM | POA: Diagnosis not present

## 2021-12-03 DIAGNOSIS — Z885 Allergy status to narcotic agent status: Secondary | ICD-10-CM

## 2021-12-03 DIAGNOSIS — N250 Renal osteodystrophy: Secondary | ICD-10-CM | POA: Diagnosis not present

## 2021-12-03 DIAGNOSIS — M1990 Unspecified osteoarthritis, unspecified site: Secondary | ICD-10-CM | POA: Diagnosis present

## 2021-12-03 DIAGNOSIS — Z992 Dependence on renal dialysis: Secondary | ICD-10-CM | POA: Diagnosis not present

## 2021-12-03 DIAGNOSIS — Z888 Allergy status to other drugs, medicaments and biological substances status: Secondary | ICD-10-CM

## 2021-12-03 DIAGNOSIS — E1142 Type 2 diabetes mellitus with diabetic polyneuropathy: Secondary | ICD-10-CM | POA: Diagnosis not present

## 2021-12-03 DIAGNOSIS — N2581 Secondary hyperparathyroidism of renal origin: Secondary | ICD-10-CM | POA: Diagnosis present

## 2021-12-03 DIAGNOSIS — I739 Peripheral vascular disease, unspecified: Secondary | ICD-10-CM | POA: Diagnosis present

## 2021-12-03 DIAGNOSIS — Z79899 Other long term (current) drug therapy: Secondary | ICD-10-CM

## 2021-12-03 DIAGNOSIS — D631 Anemia in chronic kidney disease: Secondary | ICD-10-CM | POA: Diagnosis not present

## 2021-12-03 DIAGNOSIS — E039 Hypothyroidism, unspecified: Secondary | ICD-10-CM | POA: Diagnosis present

## 2021-12-03 DIAGNOSIS — E1122 Type 2 diabetes mellitus with diabetic chronic kidney disease: Secondary | ICD-10-CM | POA: Diagnosis present

## 2021-12-03 DIAGNOSIS — Z6828 Body mass index (BMI) 28.0-28.9, adult: Secondary | ICD-10-CM | POA: Diagnosis not present

## 2021-12-03 DIAGNOSIS — I350 Nonrheumatic aortic (valve) stenosis: Secondary | ICD-10-CM | POA: Diagnosis not present

## 2021-12-03 DIAGNOSIS — I509 Heart failure, unspecified: Secondary | ICD-10-CM

## 2021-12-03 DIAGNOSIS — I132 Hypertensive heart and chronic kidney disease with heart failure and with stage 5 chronic kidney disease, or end stage renal disease: Secondary | ICD-10-CM | POA: Diagnosis not present

## 2021-12-03 DIAGNOSIS — J449 Chronic obstructive pulmonary disease, unspecified: Secondary | ICD-10-CM | POA: Diagnosis present

## 2021-12-03 DIAGNOSIS — Z66 Do not resuscitate: Secondary | ICD-10-CM | POA: Diagnosis not present

## 2021-12-03 DIAGNOSIS — F1290 Cannabis use, unspecified, uncomplicated: Secondary | ICD-10-CM | POA: Diagnosis present

## 2021-12-03 DIAGNOSIS — E872 Acidosis, unspecified: Secondary | ICD-10-CM | POA: Diagnosis present

## 2021-12-03 DIAGNOSIS — Z89611 Acquired absence of right leg above knee: Secondary | ICD-10-CM

## 2021-12-03 DIAGNOSIS — E663 Overweight: Secondary | ICD-10-CM | POA: Diagnosis present

## 2021-12-03 DIAGNOSIS — R0902 Hypoxemia: Secondary | ICD-10-CM | POA: Diagnosis not present

## 2021-12-03 DIAGNOSIS — R0602 Shortness of breath: Secondary | ICD-10-CM | POA: Diagnosis not present

## 2021-12-03 DIAGNOSIS — Z7902 Long term (current) use of antithrombotics/antiplatelets: Secondary | ICD-10-CM

## 2021-12-03 DIAGNOSIS — R0689 Other abnormalities of breathing: Secondary | ICD-10-CM | POA: Diagnosis not present

## 2021-12-03 DIAGNOSIS — E785 Hyperlipidemia, unspecified: Secondary | ICD-10-CM | POA: Diagnosis present

## 2021-12-03 DIAGNOSIS — L03116 Cellulitis of left lower limb: Secondary | ICD-10-CM | POA: Diagnosis present

## 2021-12-03 DIAGNOSIS — F17210 Nicotine dependence, cigarettes, uncomplicated: Secondary | ICD-10-CM | POA: Diagnosis not present

## 2021-12-03 DIAGNOSIS — R0603 Acute respiratory distress: Secondary | ICD-10-CM | POA: Diagnosis not present

## 2021-12-03 DIAGNOSIS — Z8701 Personal history of pneumonia (recurrent): Secondary | ICD-10-CM

## 2021-12-03 DIAGNOSIS — I5023 Acute on chronic systolic (congestive) heart failure: Secondary | ICD-10-CM | POA: Diagnosis not present

## 2021-12-03 DIAGNOSIS — I5022 Chronic systolic (congestive) heart failure: Secondary | ICD-10-CM | POA: Diagnosis not present

## 2021-12-03 DIAGNOSIS — F419 Anxiety disorder, unspecified: Secondary | ICD-10-CM | POA: Diagnosis present

## 2021-12-03 DIAGNOSIS — N186 End stage renal disease: Secondary | ICD-10-CM | POA: Diagnosis present

## 2021-12-03 DIAGNOSIS — Z79890 Hormone replacement therapy (postmenopausal): Secondary | ICD-10-CM

## 2021-12-03 DIAGNOSIS — E119 Type 2 diabetes mellitus without complications: Secondary | ICD-10-CM

## 2021-12-03 DIAGNOSIS — J189 Pneumonia, unspecified organism: Secondary | ICD-10-CM | POA: Diagnosis not present

## 2021-12-03 DIAGNOSIS — E1140 Type 2 diabetes mellitus with diabetic neuropathy, unspecified: Secondary | ICD-10-CM | POA: Diagnosis not present

## 2021-12-03 DIAGNOSIS — I255 Ischemic cardiomyopathy: Secondary | ICD-10-CM | POA: Diagnosis present

## 2021-12-03 DIAGNOSIS — F32A Depression, unspecified: Secondary | ICD-10-CM | POA: Diagnosis present

## 2021-12-03 DIAGNOSIS — I110 Hypertensive heart disease with heart failure: Secondary | ICD-10-CM | POA: Diagnosis not present

## 2021-12-03 DIAGNOSIS — E8779 Other fluid overload: Secondary | ICD-10-CM | POA: Diagnosis present

## 2021-12-03 DIAGNOSIS — I953 Hypotension of hemodialysis: Secondary | ICD-10-CM | POA: Diagnosis not present

## 2021-12-03 DIAGNOSIS — F4310 Post-traumatic stress disorder, unspecified: Secondary | ICD-10-CM | POA: Diagnosis present

## 2021-12-03 DIAGNOSIS — R001 Bradycardia, unspecified: Secondary | ICD-10-CM | POA: Diagnosis present

## 2021-12-03 DIAGNOSIS — E1151 Type 2 diabetes mellitus with diabetic peripheral angiopathy without gangrene: Secondary | ICD-10-CM | POA: Diagnosis not present

## 2021-12-03 DIAGNOSIS — L89152 Pressure ulcer of sacral region, stage 2: Secondary | ICD-10-CM | POA: Diagnosis present

## 2021-12-03 DIAGNOSIS — G894 Chronic pain syndrome: Secondary | ICD-10-CM | POA: Diagnosis present

## 2021-12-03 DIAGNOSIS — I10 Essential (primary) hypertension: Secondary | ICD-10-CM | POA: Diagnosis not present

## 2021-12-03 DIAGNOSIS — Z515 Encounter for palliative care: Secondary | ICD-10-CM | POA: Diagnosis not present

## 2021-12-03 DIAGNOSIS — Z20822 Contact with and (suspected) exposure to covid-19: Secondary | ICD-10-CM | POA: Diagnosis present

## 2021-12-03 DIAGNOSIS — R062 Wheezing: Secondary | ICD-10-CM | POA: Diagnosis not present

## 2021-12-03 DIAGNOSIS — K219 Gastro-esophageal reflux disease without esophagitis: Secondary | ICD-10-CM | POA: Diagnosis present

## 2021-12-03 DIAGNOSIS — Z794 Long term (current) use of insulin: Secondary | ICD-10-CM

## 2021-12-03 DIAGNOSIS — Z7189 Other specified counseling: Secondary | ICD-10-CM | POA: Diagnosis not present

## 2021-12-03 DIAGNOSIS — L8990 Pressure ulcer of unspecified site, unspecified stage: Secondary | ICD-10-CM | POA: Insufficient documentation

## 2021-12-03 DIAGNOSIS — I2510 Atherosclerotic heart disease of native coronary artery without angina pectoris: Secondary | ICD-10-CM | POA: Diagnosis present

## 2021-12-03 DIAGNOSIS — J9601 Acute respiratory failure with hypoxia: Secondary | ICD-10-CM | POA: Diagnosis present

## 2021-12-03 DIAGNOSIS — R0600 Dyspnea, unspecified: Secondary | ICD-10-CM | POA: Diagnosis not present

## 2021-12-03 DIAGNOSIS — R059 Cough, unspecified: Secondary | ICD-10-CM | POA: Diagnosis not present

## 2021-12-03 DIAGNOSIS — R069 Unspecified abnormalities of breathing: Secondary | ICD-10-CM | POA: Diagnosis not present

## 2021-12-03 DIAGNOSIS — E1159 Type 2 diabetes mellitus with other circulatory complications: Secondary | ICD-10-CM | POA: Diagnosis not present

## 2021-12-03 LAB — COMPREHENSIVE METABOLIC PANEL
ALT: 55 U/L — ABNORMAL HIGH (ref 0–44)
AST: 56 U/L — ABNORMAL HIGH (ref 15–41)
Albumin: 2.9 g/dL — ABNORMAL LOW (ref 3.5–5.0)
Alkaline Phosphatase: 155 U/L — ABNORMAL HIGH (ref 38–126)
Anion gap: 10 (ref 5–15)
BUN: 42 mg/dL — ABNORMAL HIGH (ref 8–23)
CO2: 29 mmol/L (ref 22–32)
Calcium: 9.1 mg/dL (ref 8.9–10.3)
Chloride: 102 mmol/L (ref 98–111)
Creatinine, Ser: 5.66 mg/dL — ABNORMAL HIGH (ref 0.61–1.24)
GFR, Estimated: 10 mL/min — ABNORMAL LOW (ref >60)
Glucose, Bld: 179 mg/dL — ABNORMAL HIGH (ref 70–99)
Potassium: 4.3 mmol/L (ref 3.5–5.1)
Sodium: 141 mmol/L (ref 135–145)
Total Bilirubin: 1 mg/dL (ref 0.3–1.2)
Total Protein: 6.6 g/dL (ref 6.5–8.1)

## 2021-12-03 LAB — LACTIC ACID, PLASMA: Lactic Acid, Venous: 2 mmol/L (ref 0.5–1.9)

## 2021-12-03 LAB — CBC WITH DIFFERENTIAL/PLATELET
Abs Immature Granulocytes: 0.04 10*3/uL (ref 0.00–0.07)
Basophils Absolute: 0 10*3/uL (ref 0.0–0.1)
Basophils Relative: 1 %
Eosinophils Absolute: 0.1 10*3/uL (ref 0.0–0.5)
Eosinophils Relative: 2 %
HCT: 32.8 % — ABNORMAL LOW (ref 39.0–52.0)
Hemoglobin: 10.4 g/dL — ABNORMAL LOW (ref 13.0–17.0)
Immature Granulocytes: 1 %
Lymphocytes Relative: 15 %
Lymphs Abs: 0.8 10*3/uL (ref 0.7–4.0)
MCH: 32.9 pg (ref 26.0–34.0)
MCHC: 31.7 g/dL (ref 30.0–36.0)
MCV: 103.8 fL — ABNORMAL HIGH (ref 80.0–100.0)
Monocytes Absolute: 0.7 10*3/uL (ref 0.1–1.0)
Monocytes Relative: 12 %
Neutro Abs: 3.8 10*3/uL (ref 1.7–7.7)
Neutrophils Relative %: 69 %
Platelets: 134 10*3/uL — ABNORMAL LOW (ref 150–400)
RBC: 3.16 MIL/uL — ABNORMAL LOW (ref 4.22–5.81)
RDW: 20.4 % — ABNORMAL HIGH (ref 11.5–15.5)
WBC: 5.5 10*3/uL (ref 4.0–10.5)
nRBC: 0 % (ref 0.0–0.2)

## 2021-12-03 LAB — RESP PANEL BY RT-PCR (FLU A&B, COVID) ARPGX2
Influenza A by PCR: NEGATIVE
Influenza B by PCR: NEGATIVE
SARS Coronavirus 2 by RT PCR: NEGATIVE

## 2021-12-03 LAB — GLUCOSE, CAPILLARY
Glucose-Capillary: 142 mg/dL — ABNORMAL HIGH (ref 70–99)
Glucose-Capillary: 144 mg/dL — ABNORMAL HIGH (ref 70–99)

## 2021-12-03 LAB — PROCALCITONIN: Procalcitonin: 2.65 ng/mL

## 2021-12-03 LAB — TROPONIN I (HIGH SENSITIVITY): Troponin I (High Sensitivity): 287 ng/L (ref <18)

## 2021-12-03 LAB — BRAIN NATRIURETIC PEPTIDE: B Natriuretic Peptide: >4500 pg/mL — ABNORMAL HIGH (ref 0.0–100.0)

## 2021-12-03 LAB — APTT: aPTT: 36 seconds (ref 24–36)

## 2021-12-03 LAB — PROTIME-INR
INR: 1.4 — ABNORMAL HIGH (ref 0.8–1.2)
Prothrombin Time: 17 seconds — ABNORMAL HIGH (ref 11.4–15.2)

## 2021-12-03 MED ORDER — PANTOPRAZOLE SODIUM 40 MG PO TBEC: 40.0000 mg | DELAYED_RELEASE_TABLET | Freq: Every day | ORAL | Status: DC | PRN

## 2021-12-03 MED ORDER — SENNA 8.6 MG PO TABS
1.0000 | ORAL_TABLET | Freq: Two times a day (BID) | ORAL | Status: DC
  Administered 2021-12-03 – 2021-12-07 (×8): 8.6 mg via ORAL
  Filled 2021-12-03 (×8): qty 1

## 2021-12-03 MED ORDER — INSULIN ASPART 100 UNIT/ML IJ SOLN
0.0000 [IU] | Freq: Three times a day (TID) | INTRAMUSCULAR | Status: DC
  Administered 2021-12-05: 1 [IU] via SUBCUTANEOUS
  Administered 2021-12-05: 2 [IU] via SUBCUTANEOUS
  Administered 2021-12-06 – 2021-12-07 (×5): 1 [IU] via SUBCUTANEOUS

## 2021-12-03 MED ORDER — IPRATROPIUM-ALBUTEROL 0.5-2.5 (3) MG/3ML IN SOLN
3.0000 mL | Freq: Once | RESPIRATORY_TRACT | Status: AC
  Administered 2021-12-03: 3 mL via RESPIRATORY_TRACT
  Filled 2021-12-03: qty 3

## 2021-12-03 MED ORDER — LACTATED RINGERS IV SOLN: INTRAVENOUS | Status: DC

## 2021-12-03 MED ORDER — CLOPIDOGREL BISULFATE 75 MG PO TABS
75.0000 mg | ORAL_TABLET | Freq: Every day | ORAL | Status: DC
  Administered 2021-12-03 – 2021-12-07 (×5): 75 mg via ORAL
  Filled 2021-12-03 (×5): qty 1

## 2021-12-03 MED ORDER — ALBUTEROL SULFATE (2.5 MG/3ML) 0.083% IN NEBU
INHALATION_SOLUTION | RESPIRATORY_TRACT | Status: AC
Start: 2021-12-03 — End: 2021-12-03
  Administered 2021-12-03: 5 mg
  Filled 2021-12-03: qty 6

## 2021-12-03 MED ORDER — RENA-VITE PO TABS
1.0000 | ORAL_TABLET | Freq: Every day | ORAL | Status: DC
  Administered 2021-12-03 – 2021-12-07 (×5): 1 via ORAL
  Filled 2021-12-03 (×6): qty 1

## 2021-12-03 MED ORDER — CHLORHEXIDINE GLUCONATE CLOTH 2 % EX PADS
6.0000 | MEDICATED_PAD | Freq: Every day | CUTANEOUS | Status: DC
  Administered 2021-12-04 – 2021-12-06 (×3): 6 via TOPICAL

## 2021-12-03 MED ORDER — IPRATROPIUM BROMIDE 0.02 % IN SOLN
RESPIRATORY_TRACT | Status: AC
  Administered 2021-12-03: 0.5 mg
  Filled 2021-12-03: qty 2.5

## 2021-12-03 MED ORDER — SODIUM CHLORIDE 0.9 % IV SOLN
2.0000 g | INTRAVENOUS | Status: DC
  Administered 2021-12-04 – 2021-12-05 (×2): 2 g via INTRAVENOUS
  Filled 2021-12-03 (×2): qty 20

## 2021-12-03 MED ORDER — SODIUM CHLORIDE 0.9 % IV SOLN
2.0000 g | INTRAVENOUS | Status: DC
  Administered 2021-12-03: 2 g via INTRAVENOUS
  Filled 2021-12-03: qty 20

## 2021-12-03 MED ORDER — LEVOTHYROXINE SODIUM 75 MCG PO TABS
150.0000 ug | ORAL_TABLET | Freq: Every day | ORAL | Status: DC
  Administered 2021-12-04 – 2021-12-07 (×4): 150 ug via ORAL
  Filled 2021-12-03 (×4): qty 2

## 2021-12-03 MED ORDER — ALPRAZOLAM 0.5 MG PO TABS
0.5000 mg | ORAL_TABLET | Freq: Two times a day (BID) | ORAL | Status: DC | PRN
  Administered 2021-12-03 – 2021-12-06 (×6): 0.5 mg via ORAL
  Filled 2021-12-03 (×6): qty 1

## 2021-12-03 MED ORDER — CALCIUM ACETATE (PHOS BINDER) 667 MG PO CAPS: 667.0000 mg | ORAL_CAPSULE | Freq: Three times a day (TID) | ORAL | Status: DC | PRN

## 2021-12-03 MED ORDER — ATORVASTATIN CALCIUM 40 MG PO TABS
80.0000 mg | ORAL_TABLET | Freq: Every day | ORAL | Status: DC
  Administered 2021-12-03 – 2021-12-06 (×4): 80 mg via ORAL
  Filled 2021-12-03 (×4): qty 2

## 2021-12-03 MED ORDER — PREGABALIN 75 MG PO CAPS
150.0000 mg | ORAL_CAPSULE | Freq: Two times a day (BID) | ORAL | Status: DC
  Administered 2021-12-03 – 2021-12-07 (×8): 150 mg via ORAL
  Filled 2021-12-03 (×8): qty 2

## 2021-12-03 MED ORDER — ACETAMINOPHEN 325 MG PO TABS
650.0000 mg | ORAL_TABLET | Freq: Four times a day (QID) | ORAL | Status: DC | PRN
Start: 2021-12-03 — End: 2021-12-08
  Administered 2021-12-03 – 2021-12-06 (×3): 650 mg via ORAL
  Filled 2021-12-03 (×3): qty 2

## 2021-12-03 MED ORDER — ISOSORBIDE MONONITRATE ER 30 MG PO TB24
15.0000 mg | ORAL_TABLET | Freq: Every day | ORAL | Status: DC
  Administered 2021-12-03 – 2021-12-07 (×4): 15 mg via ORAL
  Filled 2021-12-03 (×4): qty 1

## 2021-12-03 MED ORDER — CALCITRIOL 0.25 MCG PO CAPS
0.2500 ug | ORAL_CAPSULE | ORAL | Status: DC
  Administered 2021-12-05 – 2021-12-07 (×2): 0.25 ug via ORAL
  Filled 2021-12-03 (×2): qty 1

## 2021-12-03 MED ORDER — ALBUTEROL SULFATE (2.5 MG/3ML) 0.083% IN NEBU
2.5000 mg | INHALATION_SOLUTION | Freq: Four times a day (QID) | RESPIRATORY_TRACT | Status: DC
  Administered 2021-12-03 – 2021-12-04 (×2): 2.5 mg via RESPIRATORY_TRACT
  Filled 2021-12-03 (×2): qty 3

## 2021-12-03 MED ORDER — ALBUTEROL SULFATE HFA 108 (90 BASE) MCG/ACT IN AERS
2.0000 | INHALATION_SPRAY | Freq: Four times a day (QID) | RESPIRATORY_TRACT | Status: DC | PRN
  Administered 2021-12-03: 2 via RESPIRATORY_TRACT
  Filled 2021-12-03: qty 6.7

## 2021-12-03 MED ORDER — INSULIN ASPART 100 UNIT/ML IJ SOLN
0.0000 [IU] | Freq: Every day | INTRAMUSCULAR | Status: DC
  Administered 2021-12-04 – 2021-12-06 (×2): 2 [IU] via SUBCUTANEOUS

## 2021-12-03 MED ORDER — METOPROLOL TARTRATE 25 MG PO TABS
12.5000 mg | ORAL_TABLET | Freq: Two times a day (BID) | ORAL | Status: DC
  Administered 2021-12-03 – 2021-12-07 (×6): 12.5 mg via ORAL
  Filled 2021-12-03 (×7): qty 1

## 2021-12-03 MED ORDER — CALCIUM ACETATE (PHOS BINDER) 667 MG PO CAPS
1334.0000 mg | ORAL_CAPSULE | Freq: Three times a day (TID) | ORAL | Status: DC
  Administered 2021-12-03 – 2021-12-07 (×9): 1334 mg via ORAL
  Filled 2021-12-03 (×11): qty 2

## 2021-12-03 MED ORDER — ALBUTEROL SULFATE (2.5 MG/3ML) 0.083% IN NEBU: 2.5000 mg | INHALATION_SOLUTION | RESPIRATORY_TRACT | Status: DC | PRN

## 2021-12-03 MED ORDER — CALCIUM ACETATE (PHOS BINDER) 667 MG PO CAPS: 667.0000 mg | ORAL_CAPSULE | ORAL | Status: DC

## 2021-12-03 MED ORDER — SODIUM CHLORIDE 0.9 % IV SOLN
500.0000 mg | INTRAVENOUS | Status: DC
  Administered 2021-12-03: 500 mg via INTRAVENOUS
  Filled 2021-12-03: qty 5

## 2021-12-03 MED ORDER — HEPARIN SODIUM (PORCINE) 5000 UNIT/ML IJ SOLN
5000.0000 [IU] | Freq: Three times a day (TID) | INTRAMUSCULAR | Status: DC
  Administered 2021-12-03 – 2021-12-07 (×11): 5000 [IU] via SUBCUTANEOUS
  Filled 2021-12-03 (×11): qty 1

## 2021-12-03 MED ORDER — MIDODRINE HCL 5 MG PO TABS
10.0000 mg | ORAL_TABLET | ORAL | Status: DC
  Administered 2021-12-04 – 2021-12-06 (×2): 10 mg via ORAL
  Filled 2021-12-03 (×3): qty 2

## 2021-12-03 MED ORDER — POLYETHYLENE GLYCOL 3350 17 G PO PACK
17.0000 g | PACK | Freq: Every day | ORAL | Status: DC
  Administered 2021-12-03: 17 g via ORAL
  Filled 2021-12-03 (×5): qty 1

## 2021-12-03 NOTE — Progress Notes (Signed)
Called about this patient w/ ESRD who presented to SOB to ED. Last HD yesterday. CXR borderline mild edema.  Bipap used by EMS, is better in ED on 4 L now. Per admitting MD, resp status is now stable. Pt has myriad issues the worst of which are progressive severe AS, issues w/ recurrent syncope, severe 2V CAD by recent LHC on 11/21/21 and new prob ischemic syst CM w/ EF 30-35%. Pt is DNR and is not candidate for intervention per cardiology. Cardiology called by ED MD tonight and they are recommending for possible hospice transition given the gravity of his heart disease and other comorbidities. We are asked to see for dialysis. Have written orders for HD tomorrow. Labs reviewed, in order.  ? ? TTS [facility]  ? from recent admit 11/24/21 needs updating >  ?    4.5h   400/500   106kg  2/2.5 bath LUE AVF  Hep 3500+ 202midrun ? - on 4/11 > HBsAg neg w/ Abs > 10/ protective ? - get records ? ?Kelly Splinter, MD ?12/03/2021, 4:53 PM ? ? ? ? ?

## 2021-12-03 NOTE — ED Notes (Signed)
Patient is a difficult stick with no stick on the left arm.  Multiple attempts have been made by nurses and phlebotomy to get second culture without success.  ?

## 2021-12-03 NOTE — ED Notes (Signed)
Lactic Acid: 2.0. Provider notified. ?

## 2021-12-03 NOTE — ED Provider Notes (Signed)
?[place] ?Provider Note ? ? ?CSN: [id number] ?Arrival date & time: 12/03/21  1259 ? ?  ? ?History ? ?Chief Complaint  ?Patient presents with  ? Shortness of Breath  ? ? ?[name] is a 71 y.o. male. ? ? ?Shortness of Breath ? ?This patient is a 71 year old male, he has an unfortunate history of coronary disease, end-stage renal disease and peripheral vascular disease that has resulted in a right lower extremity amputation, he was recently admitted to the hospital with new onset congestive heart failure and found to have transition from moderate aortic stenosis to severe aortic stenosis after having 2 syncopal episodes.  Unfortunately during his hospitalization his echocardiogram showed severe congestive heart failure, his heart catheterization showed severe coronary disease that was not amenable to coronary intervention and had consultation with cardiothoracic surgery who stated that the patient was not a candidate for valve replacement nor bypass surgery.  He was to have medically optimized therapy, he had a palliative care consult, he became DNR. ? ?He presents to the hospital today with increasing shortness of breath, this started last night and got significantly worse overnight and into this morning.  He presents on BiPAP from EMS with severe shortness of breath.  The patient denies having any fevers or chills but has increasing coughing with phlegm, denies any increasing swelling of his legs but is noted to have severe swelling.  Of note when I reviewed the patient's medical record his discharge summary and multiple inpatient notes showed that he had chronic edema of his legs. ? ?Home Medications ?Prior to Admission medications   ?Medication Sig Start Date End Date Taking? Authorizing Provider  ?albuterol (VENTOLIN HFA) 108 (90 Base) MCG/ACT inhaler Inhale 2 puffs into the lungs every 6 (six) hours as needed for wheezing or shortness of breath.    Yes [provider]   ?ALPRAZolam Duanne Moron) 0.5 MG tablet Take 0.5 mg by mouth 2 (two) times daily as needed for anxiety. 05/24/20  Yes [provider]  ?ARTIFICIAL TEAR OP Place 1 drop into both eyes every 6 (six) hours as needed (dry eyes).   Yes [provider]  ?atorvastatin (LIPITOR) 80 MG tablet Take 1 tablet (80 mg total) by mouth at bedtime. ?Patient taking differently: Take 80 mg by mouth daily. 12/18/18  Yes Angiulli, Lavon Paganini, PA-C  ?bismuth subsalicylate (PEPTO BISMOL) 262 MG/15ML suspension Take 30 mLs by mouth every 6 (six) hours as needed for indigestion.   Yes [provider]  ?calcitRIOL (ROCALTROL) 0.25 MCG capsule Take 1 capsule (0.25 mcg total) by mouth every Tuesday, Thursday, and Saturday at 6 PM. 11/25/21  Yes Cherene Altes, MD  ?calcium acetate (PHOSLO) 667 MG capsule Take 4 capsules (2,668 mg total) by mouth 3 (three) times daily with meals. ?Patient taking differently: Take 667-1,334 mg by mouth See admin instructions. Take 1334 mg with meals and 667 mg with each snack 12/19/18  Yes Angiulli, Lavon Paganini, PA-C  ?clopidogrel (PLAVIX) 75 MG tablet Take 1 tablet (75 mg total) by mouth daily. 12/18/18  Yes Angiulli, Lavon Paganini, PA-C  ?diphenhydrAMINE (BENADRYL) 25 MG tablet Take 25 mg by mouth daily as needed for itching.   Yes [provider]  ?ibuprofen (ADVIL) 200 MG tablet Take 400 mg by mouth in the morning and at bedtime.   Yes [provider]  ?isosorbide mononitrate (IMDUR) 30 MG 24 hr tablet Take 0.5 tablets (15 mg total) by mouth daily. 11/25/21  Yes Cherene Altes, MD  ?  levothyroxine (SYNTHROID) 150 MCG tablet Take 150 mcg by mouth daily. 11/08/21  Yes [provider]  ?metoprolol tartrate (LOPRESSOR) 25 MG tablet Take 0.5 tablets (12.5 mg total) by mouth 2 (two) times daily. 11/24/21  Yes Cherene Altes, MD  ?midodrine (PROAMATINE) 10 MG tablet Take 10-20 mg by mouth See admin instructions. On Dialysis days 11/08/21  Yes [provider]   ?multivitamin (RENA-VIT) TABS tablet Take 1 tablet by mouth daily. 10/11/19  Yes [provider]  ?ondansetron (ZOFRAN) 8 MG tablet Take 8 mg by mouth 3 (three) times daily as needed. 11/02/21  Yes [provider]  ?Oxycodone HCl 10 MG TABS Take 10 mg by mouth 3 (three) times daily. 07/12/21  Yes [provider]  ?pantoprazole (PROTONIX) 40 MG tablet Take 40 mg by mouth daily as needed (acid reflux).   Yes [provider]  ?senna (SENOKOT) 8.6 MG tablet Take 1 tablet by mouth 2 (two) times daily.   Yes [provider]  ?TOUJEO SOLOSTAR 300 UNIT/ML SOPN Inject 10-15 Units into the skin at bedtime as needed (if blood sugar is 130 or higher). 01/12/19  Yes [provider]  ?acetaminophen (TYLENOL) 325 MG tablet Take 2 tablets (650 mg total) by mouth every 6 (six) hours as needed for mild pain (or Fever >/= 101). ?Patient not taking: Reported on 12/03/2021 11/24/21   Cherene Altes, MD  ?cinacalcet (SENSIPAR) 30 MG tablet Take 1 tablet (30 mg total) by mouth every evening. ?Patient not taking: Reported on 12/03/2021 12/19/18   Angiulli, Lavon Paganini, PA-C  ?GLOBAL EASE INJECT PEN NEEDLES 32G X 4 MM MISC TO BE USED DAILY WITH INSULIN 04/14/20   [provider]  ?pregabalin (LYRICA) 75 MG capsule Take 2 capsules (150 mg total) by mouth daily. ?Patient taking differently: Take 150 mg by mouth 2 (two) times daily. 11/20/21   Patrecia Pour, MD  ?   ? ?Allergies    ?Ace inhibitors, Clonidine, Morphine and related, and Oxytocin   ? ?Review of Systems   ?Review of Systems  ?Respiratory:  Positive for shortness of breath.   ?All other systems reviewed and are negative. ? ?Physical Exam ?Updated Vital Signs ?BP 108/73 (BP Location: Right Arm)   Pulse 75   Temp 99.2 ?F (37.3 ?C) (Axillary)   Resp 19   Wt 108.3 kg   SpO2 99%   BMI 29.06 kg/m?  ?Physical Exam ?Vitals and nursing note reviewed.  ?Constitutional:   ?   General: He is in acute distress.  ?   Appearance: He is  ill-appearing.  ?HENT:  ?   Head: Normocephalic and atraumatic.  ?   Mouth/Throat:  ?   Pharynx: No oropharyngeal exudate.  ?Eyes:  ?   General: No scleral icterus.    ?   Right eye: No discharge.     ?   Left eye: No discharge.  ?   Conjunctiva/sclera: Conjunctivae normal.  ?   Pupils: Pupils are equal, round, and reactive to light.  ?Neck:  ?   Thyroid: No thyromegaly.  ?   Vascular: No JVD.  ?Cardiovascular:  ?   Rate and Rhythm: Regular rhythm. Tachycardia present.  ?   Heart sounds: Normal heart sounds. No murmur heard. ?  No friction rub. No gallop.  ?Pulmonary:  ?   Effort: Respiratory distress present.  ?   Breath sounds: Wheezing, rhonchi and rales present.  ?   Comments: Increased work of breathing, speaks in shortened sentences, accessory  muscle use is present but mild ?Abdominal:  ?   General: Bowel sounds are normal. There is no distension.  ?   Palpations: Abdomen is soft. There is no mass.  ?   Tenderness: There is no abdominal tenderness.  ?Musculoskeletal:     ?   General: No tenderness. Normal range of motion.  ?   Cervical back: Normal range of motion and neck supple.  ?   Right lower leg: Edema present.  ?   Left lower leg: Edema present.  ?   Comments: AKA present, stump appears clean, significant edema to lower extremities  ?Lymphadenopathy:  ?   Cervical: No cervical adenopathy.  ?Skin: ?   General: Skin is warm and dry.  ?   Findings: No erythema or rash.  ?   Comments: Multiple superficial ulcerations of the legs, no signs of cellulitis visible  ?Neurological:  ?   General: No focal deficit present.  ?   Mental Status: He is alert.  ?   Coordination: Coordination normal.  ?Psychiatric:     ?   Behavior: Behavior normal.  ? ? ?ED Results / Procedures / Treatments   ?Labs ?(all labs ordered are listed, but only abnormal results are displayed) ?Labs Reviewed  ?LACTIC ACID, PLASMA - Abnormal; Notable for the following components:  ?    Result Value  ? Lactic Acid, Venous 2.0 (*)   ? All other  components within normal limits  ?CBC WITH DIFFERENTIAL/PLATELET - Abnormal; Notable for the following components:  ? RBC 3.16 (*)   ? Hemoglobin 10.4 (*)   ? HCT 32.8 (*)   ? MCV 103.8 (*)   ? RDW 20.4 (*)   ? Platelets 134

## 2021-12-03 NOTE — ED Triage Notes (Signed)
Pt brought in by [facility] from home with c/o SOB. Pt is on dialysis and had it yesterday. Wife reported to EMS that they were only able to filter him during dialysis and not remove fluid which is normal for him due to his BP dropping. Home Health nurse came to see him this morning and called EMS because pt had rales, O2 sat of 92% on RA and dyspnea on exertion. EMS gave 2 puffs of Albuterol & 1 Nitro, then placed pt on CPAP. EMS reports pt's breathing has been better since placing him on the CPAP.  ?

## 2021-12-03 NOTE — H&P (Addendum)
?History and Physical  ? ? ?[name] UKG:[id number] DOB: June 10, 1951 DOA: 12/03/2021 ? ?PCP: Caryl Bis, MD  ? ?Chief Complaint: Shortness of breath ? ?HPI: [name] is a 71 y.o. male with medical history significant of severe systolic congestive heart failure, severe aortic stenosis, ESRD on HD TTS, COPD, CAD, HTN, PVD status post right AKA, chronic left lower extremity wound, and DM2, GERD, hypothyroidism, who presents today with what appears to be volume overload and acute hypoxic respiratory failure.  In the ED patient presented with BiPAP in place per EMS, able to be weaned to 4 L nasal cannula in the ED with supportive care.  Nephrology was consulted and hospitalist called for admission. ? ?Of note patient was just discharged from our facility on 11/24/2021 for newly documented severe aortic stenosis, syncope, and newly diagnosed severe systolic congestive heart failure with plans at that time to follow-up with palliative care in the outpatient setting.  Patient carries a DNR form with him today at intake. ? ?Review of Systems: As per HPI otherwise denies chest pain nausea vomiting diarrhea constipation headache fevers chills.  ? ?Assessment/Plan ?Principal Problem: ?  Acute exacerbation of congestive heart failure ([place]) ?Active Problems: ?  ESRD (end stage renal disease) ([place]) ?  Peripheral vascular disease, unspecified ([place]) ?  Acute respiratory failure with hypoxia ([place]) ?  Diabetes mellitus with end stage renal disease ([place]) ?  Diabetic peripheral neuropathy ([place]) ?  Anemia in chronic kidney disease ?  Essential hypertension ?  DMII (diabetes mellitus, type 2) ([place]) ?  Type 2 diabetes mellitus with diabetic neuropathy ([facility]) ?  GERD (gastroesophageal reflux disease) ?  Severe aortic stenosis by prior echocardiogram ?  ? ?Acute hypoxic respiratory failure multifactorial; ?Secondary to acute exacerbation of systolic heart failure ?Volume overload in the setting of ESRD ?Concern for  noncompliance ?Severe aortic stenosis ?Rule out COPD exacerbation ?-Patient unfortunately has multiple comorbid conditions contributing to his acute hypoxic respiratory failure. ?-Patient's heart failure is somewhat difficult to control given his ESRD status and inability to tolerate diuretics at home given poor urine output ?-Nephrology consulted, last dialysis Saturday (22nd) - plan for dialysis in the next 24 hours for volume management ?-Continue supportive care including supplemental oxygen, BiPAP as needed -of note patient is DNR ?-Resume home medications once verified via med rec for aggressive hypertension control ?-Continue hypertensive control as below ? ?Goals of care ?Patient recently discharged with plans for palliative care follow-up, will reconsult in the morning to further evaluate patient and discuss short-term and long-term goals in the setting of patient's multiple profound comorbid conditions given above ? ?Lactic acidosis ?Rule out infectious process (Cellulitis presumed at intake), POA ?Likely in the setting of volume status, patient does not meet sepsis criteria ?No signs or symptoms of infection at this time -procalcitonin pending, if positive restart broad-spectrum antibiotics ?**Addendum - procalcitonin elevated >2; cannot give IVF due to ESRD - will initiation coverage for presumed cellulitis with ceftriaxone until cultures or more likely primary source present themselves. ?Questionable chest xray findings secondary to volume status as above given bilateral nature ? ?CAD -continue statin, Plavix beta-blocker ?Hypertension -continue metoprolol, isosorbide (midodrine on dialysis days) ?PVD status post right AKA -continue Plavix statin beta-blocker ?Noninsulin-dependent diabetes mellitus type 2 -continue sliding scale insulin, hypoglycemic protocol ?GERD  ?Hypothyroidism -resume home levothyroxine ? ? ?DVT prophylaxis: Heparin ?Code Status: DNR ?Family Communication: None present ?Status is:  Inpatient ? ?Dispo: The patient is from: Home ?  Anticipated d/c is to: To be determined ?             Anticipated d/c date is: 72+ hours ?             Patient currently not medically stable for discharge  ? ?Consultants:  ?Nephrology ? ?Procedures:  ?None ? ? ?Past Medical History:  ?Diagnosis Date  ? Anemia   ? Anxiety   ? Arthritis   ? CAD (coronary artery disease)   ? Myoview August 2021 with evidence of large inferior scar but no active ischemia  ? COPD (chronic obstructive pulmonary disease) ([place])   ? Depression   ? ESRD on hemodialysis ([name])   ? Essential hypertension   ? GERD (gastroesophageal reflux disease)   ? H/O hiatal hernia   ? Headache(784.0)   ? History of kidney stones   ? History of pneumonia   ? Hypothyroidism   ? Neuropathy   ? Non-healing wound of amputation stump ([place])   ? Right  ? Peripheral vascular disease ([place])   ? PTSD (post-traumatic stress disorder)   ? Type 2 diabetes mellitus ([place])   ? ? ?Past Surgical History:  ?Procedure Laterality Date  ? A/V FISTULAGRAM Left 01/16/2019  ? Procedure: A/V FISTULAGRAM;  Surgeon: [name], MD;  Location: [facility];  Service: Cardiovascular;  Laterality: Left;  ? ABDOMINAL AORTOGRAM W/LOWER EXTREMITY Bilateral 03/18/2020  ? Procedure: ABDOMINAL AORTOGRAM W/LOWER EXTREMITY;  Surgeon: [name], MD;  Location: [facility];  Service: Cardiovascular;  Laterality: Bilateral;  ? AMPUTATION Right 12/01/2018  ? Procedure: RIGHT AMPUTATION BELOW KNEE;  Surgeon: [name], MD;  Location: [facility] OR;  Service: Vascular;  Laterality: Right;  ? AMPUTATION Right 04/27/2019  ? Procedure: AMPUTATION BELOW KNEE REVISION;  Surgeon: [name], MD;  Location[facility] OR;  Service: Vascular;  Laterality: Right;  ? AMPUTATION Right 04/30/2019  ? Procedure: AMPUTATION ABOVE KNEE - RIGHT;  Surgeon: Waynetta Sandy, MD;  Location: [facility];  Service: Vascular;  Laterality: Right;  ? APPLICATION OF WOUND VAC Right 04/27/2019  ?  Procedure: APPLICATION OF WOUND VAC;  Surgeon: [name], MD;  Location: [facility];  Service: Vascular;  Laterality: Right;  ? AV FISTULA PLACEMENT  2012  ?    left arm   ? AV FISTULA PLACEMENT Left 12/18/2012  ? Procedure: ARTERIOVENOUS (AV) FISTULA CREATION;  Surgeon: Angelia Mould, MD;  Location: [facility];  Service: Vascular;  Laterality: Left;  ? COLONOSCOPY  10/26/2011  ? Procedure: COLONOSCOPY;  Surgeon: Rogene Houston, MD;  [facility];  Service: Endoscopy;  Laterality: N/A;  730  ? EMBOLECTOMY Left 12/09/2012  ? Procedure: EMBOLECTOMY;  Surgeon: Serafina Mitchell, MD;  Location[facility] LAB;  Service: Cardiovascular;  Laterality: Left;  left arm venous embolization  ? ENDARTERECTOMY Left 10/24/2020  ? Procedure: LEFT CAROTID ENDARTERECTOMY;  Surgeon: [name], MD;  Location[facility];  Service: Vascular;  Laterality: Left;  ? ENDARTERECTOMY FEMORAL Left 04/04/2020  ? Procedure: ENDARTERECTOMY COMMON FEMORAL;  Surgeon: [name], MD;  Location: [facility] OR;  Service: Vascular;  Laterality: Left;  ? ESOPHAGOGASTRODUODENOSCOPY (EGD) WITH ESOPHAGEAL DILATION N/A 04/23/2013  ? Procedure: ESOPHAGOGASTRODUODENOSCOPY (EGD) WITH ESOPHAGEAL DILATION;  Surgeon: Rogene Houston, MD;  [facility];  Service: Endoscopy;  Laterality: N/A;  200-moved to 930   ? FEMORAL-POPLITEAL BYPASS GRAFT Left 04/04/2020  ? Procedure: FEMORAL- BELOW KNEE POPLITEAL BYPASS GRAFT NON REVERSED VEIN;  Surgeon: [name], MD;  Location: [place];  Service: Vascular;  Laterality: Left;  ? FISTULA SUPERFICIALIZATION Left 06/18/2013  ? Procedure: FISTULA SUPERFICIALIZATION & LIGATION BRANCH X 1;  Surgeon: Mal Misty, MD;  Location: [facility];  Service: Vascular;  Laterality: Left;  ? GROIN DEBRIDEMENT Left 04/29/2020  ? Procedure: EXPLORATION LEFT GROIN WITH DEBRIDEMENT;  Surgeon: Rosetta Posner, MD;  Location: [place] [facility];  Service: Vascular;  Laterality: Left;  ? HEMATOMA EVACUATION Right 02/11/2017  ? Procedure:  EVACUATION HEMATOMA RIGHT GROIN, Repair of Right Pseudo-anerysm.;  Surgeon: [name], MD;  Location: [facility] OR;  Service: Vascular;  Laterality: Right;  ? INGUINAL HERNIA REPAIR    ? ,  times   2  ? INSERTION OF DI

## 2021-12-03 NOTE — ED Provider Notes (Signed)
Signout from Dr. Sabra Heck.  71 year old male end-stage renal disease and what sounds like end-stage cardiac disease here with increased shortness of breath.  Was initially on BiPAP now weaned to nasal cannula.  He needs admission to the hospital for symptomatic treatment.  Cardiology Dr. Lovena Le was consulted and did not recommend transfer for any aggressive treatment as patient is basically end-stage. ?Physical Exam  ?BP 112/71   Pulse 74   Temp 99.2 ?F (37.3 ?C) (Axillary)   Resp (!) 23   Wt 108.3 kg   SpO2 100%   BMI 29.06 kg/m?  ? ?Physical Exam ? ?Procedures  ?Procedures ? ?ED Course / MDM  ?  ?Medical Decision Making ?Amount and/or Complexity of Data Reviewed ?Labs: ordered. ?Radiology: ordered. ?ECG/medicine tests: ordered. ? ?Risk ?Prescription drug management. ?Decision regarding hospitalization. ? ? ?Discussed with Dr. Avon Gully [facility] hospitalist will evaluate the patient for admission. ? ? ? ? ?  ?Hayden Rasmussen, MD ?12/04/21 1118 ? ?

## 2021-12-03 NOTE — Consult Note (Signed)
[facility] Nurse Consult Note: ?Reason for Consult:Dr, [facility] consults for right stump injury from puppy (scratch).  ?Wound type:Trauma ?Pressure Injury POA:NA ?Wound bed:dry ?Drainage (amount, consistency, odor) none ?Periwound:mild erythema ?Dressing procedure/placement/frequency:I have provided guidance for Nursing using a soap and water cleanse, pat dry and topical care using xeroform gauze, dry gauze and securing with Kerlix roll gauze. If desired, a silicone foam could secure the xeroform and gauze rather than Kerlix. Change daily.  ? ?[facility] nursing team will not follow, but will remain available to this patient, the nursing and medical teams.  Please re-consult if needed. ?Thanks, ?Maudie Flakes, MSN, RN, IXL, [place], CWON-[facility], [place]  ?Pager# [phone]  ? ? ? ?  ?

## 2021-12-03 NOTE — Sepsis Progress Note (Signed)
Elink following code sepsis °

## 2021-12-04 ENCOUNTER — Encounter (HOSPITAL_COMMUNITY): Payer: Self-pay | Admitting: *Deleted

## 2021-12-04 ENCOUNTER — Other Ambulatory Visit: Payer: Medicare Other | Admitting: Family Medicine

## 2021-12-04 ENCOUNTER — Encounter: Payer: Self-pay | Admitting: Family Medicine

## 2021-12-04 DIAGNOSIS — E1122 Type 2 diabetes mellitus with diabetic chronic kidney disease: Secondary | ICD-10-CM

## 2021-12-04 DIAGNOSIS — E1159 Type 2 diabetes mellitus with other circulatory complications: Secondary | ICD-10-CM

## 2021-12-04 DIAGNOSIS — L8990 Pressure ulcer of unspecified site, unspecified stage: Secondary | ICD-10-CM

## 2021-12-04 DIAGNOSIS — E1142 Type 2 diabetes mellitus with diabetic polyneuropathy: Secondary | ICD-10-CM

## 2021-12-04 DIAGNOSIS — I5023 Acute on chronic systolic (congestive) heart failure: Secondary | ICD-10-CM | POA: Diagnosis not present

## 2021-12-04 DIAGNOSIS — I10 Essential (primary) hypertension: Secondary | ICD-10-CM

## 2021-12-04 DIAGNOSIS — I350 Nonrheumatic aortic (valve) stenosis: Secondary | ICD-10-CM

## 2021-12-04 DIAGNOSIS — D631 Anemia in chronic kidney disease: Secondary | ICD-10-CM

## 2021-12-04 DIAGNOSIS — N186 End stage renal disease: Secondary | ICD-10-CM | POA: Diagnosis not present

## 2021-12-04 DIAGNOSIS — K219 Gastro-esophageal reflux disease without esophagitis: Secondary | ICD-10-CM

## 2021-12-04 DIAGNOSIS — I739 Peripheral vascular disease, unspecified: Secondary | ICD-10-CM

## 2021-12-04 DIAGNOSIS — J9601 Acute respiratory failure with hypoxia: Secondary | ICD-10-CM

## 2021-12-04 DIAGNOSIS — E1140 Type 2 diabetes mellitus with diabetic neuropathy, unspecified: Secondary | ICD-10-CM

## 2021-12-04 DIAGNOSIS — Z794 Long term (current) use of insulin: Secondary | ICD-10-CM

## 2021-12-04 DIAGNOSIS — Z992 Dependence on renal dialysis: Secondary | ICD-10-CM

## 2021-12-04 LAB — BASIC METABOLIC PANEL
Anion gap: 10 (ref 5–15)
BUN: 47 mg/dL — ABNORMAL HIGH (ref 8–23)
CO2: 28 mmol/L (ref 22–32)
Calcium: 9 mg/dL (ref 8.9–10.3)
Chloride: 104 mmol/L (ref 98–111)
Creatinine, Ser: 6.36 mg/dL — ABNORMAL HIGH (ref 0.61–1.24)
GFR, Estimated: 9 mL/min — ABNORMAL LOW (ref >60)
Glucose, Bld: 147 mg/dL — ABNORMAL HIGH (ref 70–99)
Potassium: 4.2 mmol/L (ref 3.5–5.1)
Sodium: 142 mmol/L (ref 135–145)

## 2021-12-04 LAB — CBC
HCT: 27.7 % — ABNORMAL LOW (ref 39.0–52.0)
Hemoglobin: 8.7 g/dL — ABNORMAL LOW (ref 13.0–17.0)
MCH: 33 pg (ref 26.0–34.0)
MCHC: 31.4 g/dL (ref 30.0–36.0)
MCV: 104.9 fL — ABNORMAL HIGH (ref 80.0–100.0)
Platelets: 123 10*3/uL — ABNORMAL LOW (ref 150–400)
RBC: 2.64 MIL/uL — ABNORMAL LOW (ref 4.22–5.81)
RDW: 20.4 % — ABNORMAL HIGH (ref 11.5–15.5)
WBC: 5.5 10*3/uL (ref 4.0–10.5)
nRBC: 0.4 % — ABNORMAL HIGH (ref 0.0–0.2)

## 2021-12-04 LAB — GLUCOSE, CAPILLARY
Glucose-Capillary: 119 mg/dL — ABNORMAL HIGH (ref 70–99)
Glucose-Capillary: 130 mg/dL — ABNORMAL HIGH (ref 70–99)
Glucose-Capillary: 130 mg/dL — ABNORMAL HIGH (ref 70–99)
Glucose-Capillary: 203 mg/dL — ABNORMAL HIGH (ref 70–99)

## 2021-12-04 MED ORDER — IPRATROPIUM-ALBUTEROL 0.5-2.5 (3) MG/3ML IN SOLN
3.0000 mL | Freq: Four times a day (QID) | RESPIRATORY_TRACT | Status: DC
  Administered 2021-12-04 – 2021-12-07 (×13): 3 mL via RESPIRATORY_TRACT
  Filled 2021-12-04 (×2): qty 3
  Filled 2021-12-04: qty 6
  Filled 2021-12-04 (×2): qty 3
  Filled 2021-12-04: qty 6
  Filled 2021-12-04 (×6): qty 3

## 2021-12-04 MED ORDER — JUVEN PO PACK
1.0000 | PACK | Freq: Two times a day (BID) | ORAL | Status: DC
  Administered 2021-12-04: 1 via ORAL
  Filled 2021-12-04 (×5): qty 1

## 2021-12-04 MED ORDER — OXYCODONE HCL 5 MG PO TABS: 10.0000 mg | ORAL_TABLET | Freq: Three times a day (TID) | ORAL | Status: DC | PRN

## 2021-12-04 MED ORDER — IPRATROPIUM BROMIDE 0.02 % IN SOLN
RESPIRATORY_TRACT | Status: AC
  Administered 2021-12-04: 0.5 mg
  Filled 2021-12-04: qty 2.5

## 2021-12-04 MED ORDER — OXYCODONE HCL 5 MG PO TABS
10.0000 mg | ORAL_TABLET | Freq: Four times a day (QID) | ORAL | Status: DC | PRN
  Administered 2021-12-04 – 2021-12-05 (×2): 10 mg via ORAL
  Filled 2021-12-04 (×2): qty 2

## 2021-12-04 NOTE — Progress Notes (Signed)
?PROGRESS NOTE ? ? ? ?[name]  QIH:[id number] DOB: 1950-09-01 DOA: 12/03/2021 ?PCP: Clayton Bis, MD ? ? ?Brief Narrative:  ?[name] is a 71 y.o. male with medical history significant of severe systolic congestive heart failure, severe aortic stenosis, ESRD on HD TTS, COPD on room air, CAD, HTN, PVD status post right AKA, chronic left lower extremity wound, and insulin-dependent DM2, GERD, hypothyroidism, who presents today with what appears to be volume overload and acute hypoxic respiratory failure.  In the ED patient presented with BiPAP in place per EMS, able to be weaned to 4 L nasal cannula in the ED with supportive care.  Nephrology was consulted and hospitalist called for admission. ?  ?Of note patient was just discharged from our facility on 11/24/2021 for newly documented severe aortic stenosis, syncope, and newly diagnosed severe systolic congestive heart failure with plans at that time to follow-up with palliative care in the outpatient setting.  Patient carries a DNR form with him at intake but wishes to be returned to full code per discussion on 12/04/2021. ? ?Assessment & Plan: ?  ?Principal Problem: ?  Acute exacerbation of congestive heart failure ([place]) ?Active Problems: ?  ESRD (end stage renal disease) ([place]) ?  Peripheral vascular disease, unspecified ([place]) ?  Acute respiratory failure with hypoxia ([place]) ?  Diabetes mellitus with end stage renal disease ([place]) ?  Diabetic peripheral neuropathy ([place]) ?  Anemia in chronic kidney disease ?  Essential hypertension ?  DMII (diabetes mellitus, type 2) ([place]) ?  Type 2 diabetes mellitus with diabetic neuropathy (H[facility]) ?  GERD (gastroesophageal reflux disease) ?  Severe aortic stenosis by prior echocardiogram ?  Pressure injury of skin ? ? ?Acute hypoxic respiratory failure multifactorial; ?Secondary to acute exacerbation of systolic heart failure ?Volume overload in the setting of ESRD ?Concern for noncompliance ?Severe aortic stenosis ?Rule  out COPD exacerbation ?-Patient unfortunately has multiple comorbid conditions contributing to his acute hypoxic respiratory failure. ?-Patient's heart failure is somewhat difficult to control given his ESRD status and inability to tolerate diuretics at home given poor urine output -patient's home diet apparently has not been as tightly controlled as 1 would hope for, continues to eat deli meat and canned food on occasion. ?-Nephrology consulted, last dialysis Saturday (22nd) - plan for dialysis later today ?-Continue supportive care including supplemental oxygen, BiPAP as needed  ?-Resume home medications including isosorbide, metoprolol ?-Continue hypertensive control as below ?  ?Goals of care ?Patient recently discharged with plans for palliative care follow-up, will reconsult in the morning to further evaluate patient and discuss short-term and long-term goals in the setting of patient's multiple profound comorbid conditions given above ?-Patient wishes to be changed back to full CODE STATUS today 12/04/2021, we had a lengthy discussion about goals of care, given his multiple comorbidities and our concern for increased morbidity/mortality ?  ?Lactic acidosis ?Rule out infectious process (Cellulitis -left leg - presumed at intake), POA ?Likely in the setting of volume status, patient does not meet sepsis criteria ?Procalcitonin elevated >2; cannot give IVF due to ESRD  ?Cover presumed cellulitis with ceftriaxone until cultures result ?Questionable chest xray findings secondary to volume status as above given bilateral nature ?  ?CAD -continue statin, Plavix beta-blocker ?Hypertension -continue metoprolol, isosorbide (midodrine on dialysis days) ?PVD status post right AKA -continue Plavix statin beta-blocker ?Noninsulin-dependent diabetes mellitus type 2 -continue sliding scale insulin, hypoglycemic protocol ?GERD  ?Hypothyroidism -resume home levothyroxine ?  ?  ?DVT prophylaxis: Heparin ?Code Status:  DNR ?Family  Communication: None present ? ?Status is: Inpatient ? ?Dispo: The patient is from: Home ?             Anticipated d/c is to: Home ?             Anticipated d/c date is: 40 to 72 hours ?             Patient currently not medically stable for discharge ? ?Consultants:  ?Nephrology ? ?Procedures:  ?None ? ?Antimicrobials:  ?Ceftriaxone ? ?Subjective: ?No acute issues or events overnight denies nausea vomiting diarrhea constipation headache fevers chills or chest pain; shortness of breath and orthopnea ongoing but markedly improved since admission. ? ?Objective: ?Vitals:  ? 12/04/21 0100 12/04/21 0208 12/04/21 0426 12/04/21 0554  ?BP: 102/67  118/65   ?Pulse: 62  (!) 58   ?Resp: (!) 21  20   ?Temp: 98.5 ?F (36.9 ?C)  97.7 ?F (36.5 ?C)   ?TempSrc: Oral  Oral   ?SpO2: 100% 97% 100%   ?Weight:    106.7 kg  ?Height:      ? ? ?Intake/Output Summary (Last 24 hours) at 12/04/2021 0700 ?Last data filed at 12/03/2021 1840 ?Gross per 24 hour  ?Intake 906.88 ml  ?Output --  ?Net 906.88 ml  ? ?Filed Weights  ? 12/03/21 1305 12/03/21 1824 12/04/21 0554  ?Weight: 108.3 kg 108.3 kg 106.7 kg  ? ? ?Examination: ? ?General exam: Appears calm and comfortable  ?Respiratory system: Clear to auscultation. Respiratory effort normal. ?Cardiovascular system: S1 & S2 heard, RRR. No JVD, murmurs, rubs, gallops or clicks. No pedal edema. ?Gastrointestinal system: Abdomen is nondistended, soft and nontender. No organomegaly or masses felt. Normal bowel sounds heard. ?Central nervous system: Alert and oriented. No focal neurological deficits. ?Extremities: Symmetric 5 x 5 power. ?Skin: No rashes, lesions or ulcers ?Psychiatry: Judgement and insight appear normal. Mood & affect appropriate.  ? ? ? ?Data Reviewed: I have personally reviewed following labs and imaging studies ? ?CBC: ?Recent Labs  ?Lab 12/03/21 ?1312 12/04/21 ?0418  ?WBC 5.5 5.5  ?NEUTROABS 3.8  --   ?HGB 10.4* 8.7*  ?HCT 32.8* 27.7*  ?MCV 103.8* 104.9*  ?PLT 134* 123*  ? ?Basic  Metabolic Panel: ?Recent Labs  ?Lab 12/03/21 ?1516 12/04/21 ?0418  ?NA 141 142  ?K 4.3 4.2  ?CL 102 104  ?CO2 29 28  ?GLUCOSE 179* 147*  ?BUN 42* 47*  ?CREATININE 5.66* 6.36*  ?CALCIUM 9.1 9.0  ? ?GFR: ?Estimated Creatinine Clearance: 14.3 mL/min (A) (by C-G formula based on SCr of 6.36 mg/dL (H)). ?Liver Function Tests: ?Recent Labs  ?Lab 12/03/21 ?1516  ?AST 56*  ?ALT 55*  ?ALKPHOS 155*  ?BILITOT 1.0  ?PROT 6.6  ?ALBUMIN 2.9*  ? ?No results for input(s): LIPASE, AMYLASE in the last 168 hours. ?No results for input(s): AMMONIA in the last 168 hours. ?Coagulation Profile: ?Recent Labs  ?Lab 12/03/21 ?1312  ?INR 1.4*  ? ?Cardiac Enzymes: ?No results for input(s): CKTOTAL, CKMB, CKMBINDEX, TROPONINI in the last 168 hours. ?BNP (last 3 results) ?No results for input(s): PROBNP in the last 8760 hours. ?HbA1C: ?No results for input(s): HGBA1C in the last 72 hours. ?CBG: ?Recent Labs  ?Lab 12/03/21 ?1750 12/03/21 ?2042  ?GLUCAP 142* 144*  ? ?Lipid Profile: ?No results for input(s): CHOL, HDL, LDLCALC, TRIG, CHOLHDL, LDLDIRECT in the last 72 hours. ?Thyroid Function Tests: ?No results for input(s): TSH, T4TOTAL, FREET4, T3FREE, THYROIDAB in the last 72 hours. ?Anemia Panel: ?No results for input(s):  VITAMINB12, FOLATE, FERRITIN, TIBC, IRON, RETICCTPCT in the last 72 hours. ?Sepsis Labs: ?Recent Labs  ?Lab 12/03/21 ?1354 12/03/21 ?1516  ?PROCALCITON  --  2.65  ?LATICACIDVEN 2.0*  --   ? ? ?Recent Results (from the past 240 hour(s))  ?Blood Culture (routine x 2)     Status: None (Preliminary result)  ? Collection Time: 12/03/21  1:21 PM  ? Specimen: BLOOD  ?Result Value Ref Range Status  ? Specimen Description BLOOD BLOOD RIGHT FOREARM  Final  ? Special Requests   Final  ?  BOTTLES DRAWN AEROBIC AND ANAEROBIC Blood Culture adequate volume ?Performed at [facility], [address], [place], [place] [address] ?  ? Culture PENDING  Incomplete  ? Report Status PENDING  Incomplete  ?Resp Panel by RT-PCR (Flu A&B, Covid)  Nasopharyngeal Swab     Status: None  ? Collection Time: 12/03/21  2:24 PM  ? Specimen: Nasopharyngeal Swab; Nasopharyngeal(NP) swabs in vial transport medium  ?Result Value Ref Range Status  ? SARS Coronavirus 2 by

## 2021-12-04 NOTE — Consult Note (Signed)
? ?                                                                                ?Consultation Note ?Date: 12/04/2021  ? ?Patient Name: [name]  ?DOB: 12/06/1950  MRN: [id number]  Age / Sex: 71 y.o., male  ?PCP: Clayton Bis, MD ?Referring Physician: Little Ishikawa, MD ? ?Reason for Consultation: Establishing goals of care ? ?HPI/Patient Profile: 71 y.o. male  with past medical history of HFrEF EF 30-35%, severe aortic stenosis, ESRD on HD, COPD, HTN, PVD, s/p R AKA, diabetes, history of nonhealing wound, hypothyroidism, PTSD admitted on 12/03/2021 with shortness of breath with signs of volume overload with acute hypoxic respiratory failure. Seen by inpatient palliative team 11/24/21 with documented decision for DNR but no ready for hospice with plans to continue dialysis. ? ?Clinical Assessment and Goals of Care: ?I met today with [name] after reviewing records and past palliative notes. [name] and I speak about his condition and how he is feeling. He shares with me some of his experiences especially from the [employer] and avoiding [place]. He shares about "close calls" and times (like in [employer]) when he knew his life could be at risk. He shares that now it is different because it is not really a risk but a known that his time is limited as we cannot fix his heart. We were joined by his wife [name] and then Dr. Avon [name].  ? ?We discussed more about how his severe heart disease is limiting his ability to tolerate dialysis. We discussed quality of life and dialysis may become more difficult and may cause significant decline in his quality of life. I also explained that there are limits to dialysis and often a time comes when the body no longer allows [place] to do dialysis - in his case his heart may not allow [place] to continue dialysis in the near future. [name] is able to express that today and right now he wants as much time as he can get with his wife and family. We explained that we will do all we can to  ensure that he can live as well as he can for as long as he can but our resources to make this happen are running thin. They both express understanding. He does wish to continue with dialysis as long as possible.  ? ?We also discussed code status. I explained that we can provide CPR but if his heart were to decline to the point of needing CPR (if he were to die) then we would be in a much worse place. I do not feel he would survive a resuscitation attempt and I do not feel that he would be able to do dialysis after an event like this even best case scenario. I explained that resuscitation would not be expected to give him the time with his family that he desires.  ? ?[name] plans to discuss further with his wife [name]. I have offered to meet and discuss with other family as needed/desired and provided my contact information. All questions/concerns addressed. Emotional support provided.  ? ?Primary Decision Maker ?PATIENT ?  ? ?SUMMARY OF RECOMMENDATIONS   ?- Ongoing  conversations regarding goals of care needed ? ?Code Status/Advance Care Planning: ?Full code - encouraged consideration of DNR status ? ? ?Symptom Management:  ?Per renal, attending.  ? ?Prognosis:  ?Overall prognosis is very poor due to significant cardiac disease impacting dialysis. Unlikely to be able to tolerate dialysis much longer.  ? ?Discharge Planning: To Be Determined  ? ?  ? ?Primary Diagnoses: ?Present on Admission: ? Acute respiratory failure with hypoxia ([name]) ? Anemia in chronic kidney disease ? Diabetes mellitus with end stage renal disease ([place]) ? Diabetic peripheral neuropathy ([place]) ? Type 2 diabetes mellitus with diabetic neuropathy (HCC) ? Peripheral vascular disease, unspecified ([place]) ? ESRD (end stage renal disease) ([place]) ? Essential hypertension ? GERD (gastroesophageal reflux disease) ? ? ?I have reviewed the medical record, interviewed the patient and family, and examined the patient. The following aspects are  pertinent. ? ?Past Medical History:  ?Diagnosis Date  ? Anemia   ? Anxiety   ? Arthritis   ? CAD (coronary artery disease)   ? Myoview August 2021 with evidence of large inferior scar but no active ischemia  ? COPD (chronic obstructive pulmonary disease) ([place])   ? Depression   ? ESRD on hemodialysis ([place])   ? Essential hypertension   ? GERD (gastroesophageal reflux disease)   ? H/O hiatal hernia   ? Headache(784.0)   ? History of kidney stones   ? History of pneumonia   ? Hypothyroidism   ? Neuropathy   ? Non-healing wound of amputation stump ([place])   ? Right  ? Peripheral vascular disease ([place])   ? PTSD (post-traumatic stress disorder)   ? Type 2 diabetes mellitus ([place])   ? ?Social History  ? ?Socioeconomic History  ? Marital status: Married  ?  Spouse name: [name]  ? Number of children: Not on file  ? Years of education: Not on file  ? Highest education level: Not on file  ?Occupational History  ? Occupation: retired  ?Tobacco Use  ? Smoking status: Every Day  ?  Packs/day: 0.50  ?  Years: 18.00  ?  Pack years: 9.00  ?  Types: Cigarettes  ?  Last attempt to quit: 06/27/2020  ?  Years since quitting: 1.4  ? Smokeless tobacco: Never  ?Vaping Use  ? Vaping Use: Never used  ?Substance and Sexual Activity  ? Alcohol use: No  ?  Alcohol/week: 0.0 standard drinks  ? Drug use: Yes  ?  Types: Marijuana  ?  Comment: smokes everyday for pain relief  ? Sexual activity: Not on file  ?Other Topics Concern  ? Not on file  ?Social History Narrative  ? [name] is a 71 year old retired patient who lives with wife [name], his primary caregiver. He reports he is independent/assist with his care needs   ? He has transportation to medical appointments  ? ?Social Determinants of Health  ? ?Financial Resource Strain: Not on file  ?Food Insecurity: Not on file  ?Transportation Needs: Not on file  ?Physical Activity: Not on file  ?Stress: Not on file  ?Social Connections: Not on file  ? ?Family History  ?Problem Relation Age of Onset   ? Heart disease Father   ?     Heart Disease before age 66  ? Healthy Daughter   ? Healthy Daughter   ? ?Scheduled Meds: ? atorvastatin  80 mg Oral QHS  ? [START ON 12/05/2021] calcitRIOL  0.25 mcg Oral Q T,Th,Sat-1800  ? calcium acetate  1,334 mg  Oral TID WC  ? Chlorhexidine Gluconate Cloth  6 each Topical Q0600  ? clopidogrel  75 mg Oral Daily  ? heparin  5,000 Units Subcutaneous Q8H  ? insulin aspart  0-5 Units Subcutaneous QHS  ? insulin aspart  0-6 Units Subcutaneous TID WC  ? ipratropium-albuterol  3 mL Nebulization Q6H WA  ? isosorbide mononitrate  15 mg Oral Daily  ? levothyroxine  150 mcg Oral Q0600  ? metoprolol tartrate  12.5 mg Oral BID  ? midodrine  10 mg Oral Q M,W,F  ? multivitamin  1 tablet Oral Daily  ? polyethylene glycol  17 g Oral Daily  ? pregabalin  150 mg Oral BID  ? senna  1 tablet Oral BID  ? ?Continuous Infusions: ? cefTRIAXone (ROCEPHIN)  IV    ? ?PRN Meds:.acetaminophen, albuterol, ALPRAZolam, calcium acetate **AND** calcium acetate, pantoprazole ?Allergies  ?Allergen Reactions  ? Ace Inhibitors   ?  Unknown reaction, tolerates lisinopril   ? Clonidine   ?  Unknown reaction   ? Morphine And Related Other (See Comments)  ?  Not effective  ? Oxytocin   ?  unknown  ? ?Review of Systems  ?Constitutional:  Positive for activity change and appetite change.  ?Neurological:  Positive for weakness.  ? ?Physical Exam ?Vitals and nursing note reviewed.  ?Constitutional:   ?   General: He is not in acute distress. ?   Appearance: He is ill-appearing.  ?Cardiovascular:  ?   Rate and Rhythm: Bradycardia present.  ?Pulmonary:  ?   Effort: No tachypnea, accessory muscle usage or respiratory distress.  ?   Comments: Congested ?Abdominal:  ?   Palpations: Abdomen is soft.  ?Neurological:  ?   Mental Status: He is alert and oriented to person, place, and time.  ? ? ?Vital Signs: BP 126/65   Pulse (!) 55   Temp 97.7 ?F (36.5 ?C) (Oral)   Resp (!) 22   Ht 6' 4"  (1.93 m)   Wt 106.7 kg   SpO2 98%   BMI  28.63 kg/m?  ?Pain Scale: 0-10 ?  ?Pain Score: 0-No pain ? ? ?SpO2: SpO2: 98 % ?O2 Device:SpO2: 98 % ?O2 Flow Rate: .O2 Flow Rate (L/min): 4 L/min ? ?IO: Intake/output summary:  ?Intake/Output Summary (Last 24 hours) at 12/04/2021

## 2021-12-04 NOTE — Consult Note (Signed)
[facility] ?Renal Consultation Note  ?  ?Indication for Consultation:  Management of ESRD/hemodialysis; anemia, hypertension/volume and secondary hyperparathyroidism ? ?HPI: [name] is a 71 y.o. 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  He was placed on BiPAP by EMS and VSS in the ED.  Labs were notable for lactate of 2 and BNP 4500.  He was admitted for further evaluation and we were consulted to provide HD during his hospitalization.  He admits that they were not able to remove much fluid with dialysis on Saturday due to hypotension. ? ?Of note, he was admitted from 4/9-4/14/23 at [facility] for cardiac workup of syncope.  He was found to have severe 2 vessel CAD (inoperable) as well as severe aortic stenosis (not a surgical candidate), and new systolic CHF (EF 02-72%).  He was seen by palliative care and was made DNR. ? ?Past Medical History:  ?Diagnosis Date  ? Anemia   ? Anxiety   ? Arthritis   ? CAD (coronary artery disease)   ? Myoview August 2021 with evidence of large inferior scar but no active ischemia  ? COPD (chronic obstructive pulmonary disease) ([place])   ? Depression   ? ESRD on hemodialysis ([place])   ? Essential hypertension   ? GERD (gastroesophageal reflux disease)   ? H/O hiatal hernia   ? Headache(784.0)   ? History of kidney stones   ? History of pneumonia   ? Hypothyroidism   ? Neuropathy   ? Non-healing wound of amputation stump ([place])   ? Right  ? Peripheral vascular disease ([place])   ? PTSD (post-traumatic stress disorder)   ? Type 2 diabetes mellitus ([place])   ? ?Past Surgical History:  ?Procedure Laterality Date  ? A/V FISTULAGRAM Left 01/16/2019  ? Procedure: A/V FISTULAGRAM;  Surgeon: [name], MD;  Location: [facility];   Service: Cardiovascular;  Laterality: Left;  ? ABDOMINAL AORTOGRAM W/LOWER EXTREMITY Bilateral 03/18/2020  ? Procedure: ABDOMINAL AORTOGRAM W/LOWER EXTREMITY;  Surgeon: [name], MD;  Location: [place] [facility];  Service: Cardiovascular;  Laterality: Bilateral;  ? AMPUTATION Right 12/01/2018  ? Procedure: RIGHT AMPUTATION BELOW KNEE;  Surgeon: [name], MD;  Location: [facility] OR;  Service: Vascular;  Laterality: Right;  ? AMPUTATION Right 04/27/2019  ? Procedure: AMPUTATION BELOW KNEE REVISION;  Surgeon: [name], MD;  Location: [facility] OR;  Service: Vascular;  Laterality: Right;  ? AMPUTATION Right 04/30/2019  ? Procedure: AMPUTATION ABOVE KNEE - RIGHT;  Surgeon: Waynetta Sandy, MD;  Location: [place];  Service: Vascular;  Laterality: Right;  ? APPLICATION OF WOUND VAC Right 04/27/2019  ? Procedure: APPLICATION OF WOUND VAC;  Surgeon: [name], MD;  Location: [place];  Service: Vascular;  Laterality: Right;  ? AV FISTULA PLACEMENT  2012  ?    left arm   ? AV FISTULA PLACEMENT Left 12/18/2012  ? Procedure: ARTERIOVENOUS (AV) FISTULA CREATION;  Surgeon: Angelia Mould, MD;  Location: [place];  Service: Vascular;  Laterality: Left;  ? COLONOSCOPY  10/26/2011  ? Procedure: COLONOSCOPY;  Surgeon: Rogene Houston, MD;  [facility];  Service: Endoscopy;  Laterality: N/A;  730  ? EMBOLECTOMY Left 12/09/2012  ? Procedure:  EMBOLECTOMY;  Surgeon: Serafina Mitchell, MD;  Location: [facility] CATH LAB;  Service: Cardiovascular;  Laterality: Left;  left arm venous embolization  ? ENDARTERECTOMY Left 10/24/2020  ? Procedure: LEFT CAROTID ENDARTERECTOMY;  Surgeon: [name], MD;  Location: [facility] OR;  Service: Vascular;  Laterality: Left;  ? ENDARTERECTOMY FEMORAL Left 04/04/2020  ? Procedure: ENDARTERECTOMY COMMON FEMORAL;  Surgeon: [name], MD;  Location: [facility] OR;  Service: Vascular;  Laterality: Left;  ? ESOPHAGOGASTRODUODENOSCOPY (EGD) WITH ESOPHAGEAL DILATION N/A 04/23/2013  ?  Procedure: ESOPHAGOGASTRODUODENOSCOPY (EGD) WITH ESOPHAGEAL DILATION;  Surgeon: Rogene Houston, MD;  [facility];  Service: Endoscopy;  Laterality: N/A;  200-moved to 930   ? FEMORAL-POPLITEAL BYPASS GRAFT Left 04/04/2020  ? Procedure: FEMORAL- BELOW KNEE POPLITEAL BYPASS GRAFT NON REVERSED VEIN;  Surgeon: [name], MD;  Location: [place];  Service: Vascular;  Laterality: Left;  ? FISTULA SUPERFICIALIZATION Left 06/18/2013  ? Procedure: FISTULA SUPERFICIALIZATION & LIGATION BRANCH X 1;  Surgeon: Mal Misty, MD;  Location: [place];  Service: Vascular;  Laterality: Left;  ? GROIN DEBRIDEMENT Left 04/29/2020  ? Procedure: EXPLORATION LEFT GROIN WITH DEBRIDEMENT;  Surgeon: Rosetta Posner, MD;  Location: [place];  Service: Vascular;  Laterality: Left;  ? HEMATOMA EVACUATION Right 02/11/2017  ? Procedure: EVACUATION HEMATOMA RIGHT GROIN, Repair of Right Pseudo-anerysm.;  Surgeon: [name], MD;  Location: [facility] OR;  Service: Vascular;  Laterality: Right;  ? INGUINAL HERNIA REPAIR    ? ,  times   2  ? INSERTION OF DIALYSIS CATHETER Left 12/18/2012  ? Procedure: INSERTION OF DIALYSIS CATHETER;  Surgeon: Angelia Mould, MD;  Location: [place];  Service: Vascular;  Laterality: Left;  ? KNEE ARTHROSCOPY  2011  ? Right Knee  ? LOWER EXTREMITY ANGIOGRAPHY N/A 02/11/2017  ? Procedure: Lower Extremity Angiography;  Surgeon: Lorretta Harp, MD;  Location: [place] [facility];  Service: Cardiovascular;  Laterality: N/A;  ? PERIPHERAL ATHRECTOMY  02/11/2017  ? PERIPHERAL VASCULAR ATHERECTOMY Left 02/11/2017  ? Procedure: Peripheral Vascular Atherectomy;  Surgeon: Lorretta Harp, MD;  Location: [place] [facility];  Service: Cardiovascular;  Laterality: Left;  ? PERIPHERAL VASCULAR BALLOON ANGIOPLASTY Left 01/16/2019  ? Procedure: PERIPHERAL VASCULAR BALLOON ANGIOPLASTY;  Surgeon: [name], MD;  Location: [place] [facility];  Service: Cardiovascular;  Laterality: Left;  central vein  ? REVISON OF  ARTERIOVENOUS FISTULA Left 03/06/2016  ? Procedure: PLICATION OF LEFT BRACHIOCEPHALIC ARTERIOVENOUS FISTULA;  Surgeon: Conrad Middletown, MD;  Location: [place];  Service: Vascular;  Laterality: Left;  ? RIGHT HEART CATH AND CORONARY ANGIOGRAPHY N/A 11/21/2021  ? Procedure: RIGHT HEART CATH AND CORONARY ANGIOGRAPHY;  Surgeon: Martinique, Peter M, MD;  Location: [place] [facility];  Service: Cardiovascular;  Laterality: N/A;  ? SHUNTOGRAM N/A 12/09/2012  ? Procedure: fistulogram;  Surgeon: Serafina Mitchell, MD;  Location: [facility] CATH LAB;  Service: Cardiovascular;  Laterality: N/A;  ? SHUNTOGRAM Left 06/03/2013  ? Procedure: Fistulogram;  Surgeon: Serafina Mitchell, MD;  Location: [facility] CATH LAB;  Service: Cardiovascular;  Laterality: Left;  ? THROMBECTOMY W/ EMBOLECTOMY Left 12/11/2012  ? Procedure: THROMBECTOMY ARTERIOVENOUS FISTULA;  Surgeon: Serafina Mitchell, MD;  Location: [place];  Service: Vascular;  Laterality: Left;  ? TONSILLECTOMY    ? WOUND DEBRIDEMENT Right 02/11/2019  ? Procedure: DEBRIDEMENT WOUND RIGHT BELOW THE KNEE STUMP;  Surgeon: Serafina Mitchell, MD;  Location: [place];  Service: Vascular;  Laterality: Right;  ? ?Family  History:   ?Family History  ?Problem Relation Age of Onset  ? Heart disease Father   ?     Heart Disease before age 51  ? Healthy Daughter   ? Healthy Daughter   ? ?Social History: ? reports that he has been smoking cigarettes. He has a 9.00 pack-year smoking history. He has never used smokeless tobacco. He reports current drug use. Drug: Marijuana. He reports that he does not drink alcohol. ?Allergies  ?Allergen Reactions  ? Ace Inhibitors   ?  Unknown reaction, tolerates lisinopril   ? Clonidine   ?  Unknown reaction   ? Morphine And Related Other (See Comments)  ?  Not effective  ? Oxytocin   ?  unknown  ? ?Prior to Admission medications   ?Medication Sig Start Date End Date Taking? Authorizing Provider  ?albuterol (VENTOLIN HFA) 108 (90 Base) MCG/ACT inhaler Inhale 2 puffs into the lungs every 6 (six) hours  as needed for wheezing or shortness of breath.    Yes [provider]  ?ALPRAZolam Duanne Moron) 0.5 MG tablet Take 0.5 mg by mouth 2 (two) times daily as needed for anxiety. 05/24/20  Yes [provider]  ?ART

## 2021-12-04 NOTE — Procedures (Signed)
Fistula/Graft ? ?HD suite, consent and orders verified, UF goal 3535ms including prime/rinse. L AV Fistula bruit/ thrill identified, lungs diminished, abdominal sounds present, pt without notable edema, NAD. ? ? ?Net UF 2000 ml removed. Pt experienced several episodes of hypotension and bradycardia. BFR lowered and placed in profile 4, slight improvement in HR. Post assessment completed and baseline met. Hemostasis achieved, no acute distress noted. ? ? ? ? ? ? ?  ? ? ?

## 2021-12-04 NOTE — Progress Notes (Signed)
Initial Nutrition Assessment ? ?DOCUMENTATION CODES:  ? ?Obesity unspecified ? ?INTERVENTION:  ?- Liberalize diet from a renal to a 2g sodium diet to provide widest variety of menu options to enhance nutritional adequacy given increased nutritional needs and chronic wounds ? ?- Double protein portion to encourage PO intake ? ?- 1 packet Juven BID, each packet provides 95 calories, 2.5 grams of protein (collagen) + vitamins to support wound healing ? ?- Renal MVI with minerals daily ? ?- Provided pt with handout on "Low Sodium Nutrition Therapy"  ? ?NUTRITION DIAGNOSIS:  ? ?Increased nutrient needs related to acute illness, chronic illness as evidenced by estimated needs. ? ?GOAL:  ? ?Patient will meet greater than or equal to 90% of their needs ? ?MONITOR:  ? ?PO intake, Supplement acceptance, Diet advancement, Labs, Weight trends, I & O's ? ?REASON FOR ASSESSMENT:  ? ?Consult ?Diet education (ESRD/HF) ? ?ASSESSMENT:  ? ?Pt admitted with SOB secondary to acute exacerbation of CHF. PMH significant for severe systolic CHF, severe aortic stenosis, ESRD on HD TTS, COPD, CAD, HTN, PVD s/p R AKA, chronic L lower extremity wound, DM2, GERD and hypothyroidism. ? ?Pt awaiting dialysis. Pt eating lunch during assessment. He had chicken with rice and gravy and mixed vegetables. He states that he does not eat well at home. He is aware of what he "knows he should be eating" but states that the barriers to adhering to a renal and heart healthy diet are taste changes, needing convenience foods d/t lack of energy and he also eats meals with his wife which he states he would not make her have to eat the same way he does. He endorses a poor appetite for the past 6 months and eating about 2-3 times less than he used to given digestive problems of which he is being followed by gastroenterology outpatient, however d/t claustrophobia has been unable to partake in additional imagining. He also attributes decrease in PO intake to feeling  like he is not able to eat what he wants and that smells are affecting his appetite.   ? ?On dialysis days, he eats 1 meal per day which usually consists of oatmeal with cinnamon and brown sugar prepared with water. He does not drink any nutrition supplements as he does not like them.  ?On non-dialysis days, he may have cereal, oatmeal or an egg, cheese and fried bologna sandwich for brunch, which he states he knows he should stay away from d/t sodium content. For dinner he may have a frozen dinner-meatloaf.  ? ?He is followed by a dietitian at the dialysis center and reports that he does not always get to speak with them as he is sleeping when they come by. They have previously discussed ways to alter his diet which he reports switching from ham deli meat to a low sodium [place].  ? ?Given his need for convenience foods, we briefly discussed choosing option such as low/no sodium canned or frozen vegetables and rinsing canned foods that are not accessible in lower sodium options. We also discussed choosing lower sodium food options when selecting items at the grocery store and enhancing flavor of foods by adding Mrs. Dash, garlic powder or other spices of foods. Provided "Low Sodium Nutrition Therapy" handout for pt to review.  ? ?Noted pt's EDW to be 106 kg. Current admit weight 106.7 kg. Reviewed weight history. Pt's weight noted to remain consistent around 108 kg within the last year.  ? ?Medications: calcitriol, phoslo, SSI, rena-vit, miralax, senna, IV  abx ? ?Lab Results  ?Component Value Date/Time  ? Sodium 142 12/04/2021 0418  ? Potassium 4.2 12/04/2021 0418  ? BUN 47 (H) 12/04/2021 0418  ? Creatinine, Ser 6.36 (H) 12/04/2021 0418  ? Calcium 9.0 12/04/2021 0418  ? AST 56 (H) 12/03/2021 1516  ? ALT 55 (H) 12/03/2021 1516  ? Glucose, Bld 147 (H) 12/04/2021 0418  ? ?I/O's: +1.1L since admission ? ?NUTRITION - FOCUSED PHYSICAL EXAM: ?Deferred to follow up as pt was eating and getting ready to leave for dialysis  session.  ? ?Diet Order:   ?Diet Order   ? ?       ?  Diet 2 gram sodium Room service appropriate? Yes; Fluid consistency: Thin; Fluid restriction: 1200 mL Fluid  Diet effective now       ?  ? ?  ?  ? ?  ? ? ?EDUCATION NEEDS:  ? ?Education needs have been addressed ? ?Skin:  Skin Assessment: Skin Integrity Issues: ?Skin Integrity Issues:: Other (Comment), Stage II ?Stage II: L/R sacrum ?Other: L pretibial venous stasis ulcer; non-pressure L leg ? ?Last BM:  4/21 ? ?Height:  ? ?Ht Readings from Last 1 Encounters:  ?12/03/21 '6\' 4"'$  (1.93 m)  ? ? ?Weight:  ? ?Wt Readings from Last 1 Encounters:  ?12/04/21 106.7 kg  ? ? ?Ideal Body Weight:  84.5 kg (Adj for R AKA) ? ?BMI:  Body mass index is 31.18 kg/m?. (Adj for R AKA) ? ?Estimated Nutritional Needs:  ? ?Kcal:  2400-2600 ? ?Protein:  120-135g ? ?Fluid:  1L + UOP ? ?Clayborne Dana, RDN, LDN ?Clinical Nutrition ?

## 2021-12-04 NOTE — Progress Notes (Signed)
Patient's sister answered the door stating, "I have bronchitis.  Did we know you were coming?  My brother is in the hospital.  Are you the one who takes care of his leg wound? Let us just give you a call after he is discharged to set something up." ? ?Advised pt's sister that the appt was set up with the patient on Friday for today after he missed his my chart appointment, that as stated to the patient the home care nurse would be providing wound care and that Palliative is there to help with symptom management and advance care planning.  She states she does not know when the patient will be d/c'd from the hospital.  Damaris Hippo FNP-C ?

## 2021-12-04 NOTE — TOC Initial Note (Signed)
Transition of Care [facility]) - Initial/Assessment Note    Patient Details  Name: [name] MRN: [id number] Date of Birth: 1950-11-14  [facility] CM/SW Contact:    Villa Herb, LCSWA Phone Number: 12/04/2021, 10:50 AM  Clinical Narrative:                 Pt is high risk for readmission. CSW spoke with pt in room to complete assessment. Pt states that he lives with his wife. Pt at times needs assistance that his wife is able to help with. Pt does not drive but his wife is able to provide transportation as needed. Pt has HH currently. Pt has a walker, wheelchair, cane, and prosthetic leg to use when needed. TOC to follow for needs.   Expected Discharge Plan: Home w Home Health Services Barriers to Discharge: Continued Medical Work up   Patient Goals and CMS Choice Patient states their goals for this hospitalization and ongoing recovery are:: home with [facility] CMS Medicare.gov Compare Post Acute Care list provided to:: Patient Choice offered to / list presented to : Patient  Expected Discharge Plan and Services Expected Discharge Plan: Home w Home Health Services In-house Referral: Clinical Social Work Discharge Planning Services: CM Consult Post Acute Care Choice: Home Health Living arrangements for the past 2 months: Single Family Home                                      Prior Living Arrangements/Services Living arrangements for the past 2 months: Single Family Home Lives with:: Spouse Patient language and need for interpreter reviewed:: Yes Do you feel safe going back to the place where you live?: Yes      Need for Family Participation in Patient Care: Yes (Comment) Care giver support system in place?: Yes (comment) Current home services: DME, Home OT, Home PT, Home RN Criminal Activity/Legal Involvement Pertinent to Current Situation/Hospitalization: No - Comment as needed  Activities of Daily Living Home Assistive Devices/Equipment: Wheelchair,  Shower chair without back ADL Screening (condition at time of admission) Patient's cognitive ability adequate to safely complete daily activities?: Yes Is the patient deaf or have difficulty hearing?: Yes Does the patient have difficulty seeing, even when wearing glasses/contacts?: Yes Does the patient have difficulty concentrating, remembering, or making decisions?: No Patient able to express need for assistance with ADLs?: Yes Does the patient have difficulty dressing or bathing?: No Independently performs ADLs?: Yes (appropriate for developmental age) Does the patient have difficulty walking or climbing stairs?: Yes Weakness of Legs: Left Weakness of Arms/Hands: None  Permission Sought/Granted                  Emotional Assessment Appearance:: Appears stated age Attitude/Demeanor/Rapport: Engaged Affect (typically observed): Accepting Orientation: : Oriented to Self, Oriented to Place, Oriented to  Time, Oriented to Situation Alcohol / Substance Use: Not Applicable Psych Involvement: No (comment)  Admission diagnosis:  Acute respiratory distress [R06.03] Acute exacerbation of congestive heart failure (HCC) [I50.9] HCAP (healthcare-associated pneumonia) [J18.9] Patient Active Problem List   Diagnosis Date Noted   Acute exacerbation of congestive heart failure (HCC) 12/03/2021   Pressure injury of skin 12/03/2021   Syncope, cardiogenic 11/20/2021   Severe aortic stenosis by prior echocardiogram 11/20/2021   HFrEF (heart failure with reduced ejection fraction) (HCC) 11/20/2021   Syncope 11/19/2021   Hypotension 11/19/2021   Sepsis (HCC) 11/19/2021   GERD (gastroesophageal  reflux disease) 11/19/2021   Abdominal pain 10/30/2021   Right above-knee amputee (HCC) 11/10/2020   Tobacco abuse 11/10/2020   Carotid artery stenosis 10/24/2020   Optic neuropathy 07/29/2020   CAD (coronary artery disease)    Diabetic ulcer of ankle (HCC) 12/28/2019   Non-pressure chronic ulcer of  unspecified ankle with unspecified severity (HCC) 12/28/2019   Hx of AKA (above knee amputation) (HCC) 08/31/2019   Major depressive disorder, recurrent, moderate (HCC) 03/25/2019   Post-operative pain    Labile blood pressure    Labile blood glucose    Hx of anxiety disorder    Hypoglycemia    Diabetic peripheral neuropathy (HCC)    Neuropathic pain    S/P unilateral BKA (below knee amputation), right (HCC)    Sleep disturbance    Type 2 diabetes mellitus with peripheral neuropathy (HCC)    Benign essential HTN    Acute blood loss anemia    Anemia of chronic disease    Right below-knee amputee (HCC) 12/03/2018   Necrotizing fasciitis of ankle and foot (HCC)    Unilateral complete BKA, right, initial encounter (HCC)    Postoperative pain    Phantom limb pain (HCC)    Drug induced constipation    Poorly controlled type 2 diabetes mellitus with peripheral neuropathy (HCC)    Acute on chronic anemia    Ischemia of extremity 12/01/2018   Cellulitis of left leg 09/10/2018   Melena 11/19/2017   Unilateral primary osteoarthritis, right knee 08/09/2017   Body mass index (BMI) of 30.0 to 30.9 in adult 05/10/2017   Metabolic disorder 03/09/2017   Osteopathy in diseases classified elsewhere, unspecified site 03/09/2017   PAD (peripheral artery disease) (HCC) 02/12/2017   Critical lower limb ischemia (HCC) 02/11/2017   Dependence on renal dialysis (HCC) 01/14/2017   Patient's noncompliance with other medical treatment and regimen 01/14/2017   Anemia in chronic kidney disease 01/14/2017   DMII (diabetes mellitus, type 2) (HCC) 01/14/2017   Type 2 diabetes mellitus with diabetic neuropathy (HCC) 01/14/2017   Other specified personal risk factors, not elsewhere classified 09/12/2016   Chronic obstructive pulmonary disease with acute lower respiratory infection (HCC) 09/12/2016   Hardening of the aorta (main artery of the heart) (HCC) 06/20/2016   Hypothyroidism 06/20/2016    Gastro-esophageal reflux disease with esophagitis 03/12/2016   Peripheral neuropathy 02/16/2016   Abdominal aortic aneurysm, without rupture 12/07/2015   Repeated falls 12/07/2015   Laceration without foreign body of other part of head, initial encounter 10/04/2015   Acute respiratory failure with hypoxia (HCC) 08/28/2015   Hypertensive urgency 08/28/2015   Chronic anemia 08/28/2015   Diabetes mellitus with end stage renal disease (HCC) 08/28/2015   Other idiopathic peripheral autonomic neuropathy 02/09/2015   Abrasion of right upper arm 11/28/2014   Abrasion, right lower leg, initial encounter 11/28/2014   Strain of muscle, fascia and tendon of right hip, initial encounter 11/28/2014   Radiohumeral (joint) sprain of right elbow, initial encounter 11/28/2014   PVD (peripheral vascular disease) (HCC) 07/15/2014   Peripheral vascular disease, unspecified (HCC) 01/14/2014   Low back pain 06/02/2013   Asthma with exacerbation 05/21/2013   Pain in limb-left arm 04/09/2013   Guaiac + stool 04/07/2013   Anemia 04/07/2013   Dysphagia, unspecified(787.20) 04/07/2013   Numbness and tingling-left arm  02/04/2013   Other complications due to renal dialysis device, implant, and graft 06/06/2012   Tobacco use disorder 05/23/2012   Osteoarthrosis 05/23/2012   Mixed hyperlipidemia 05/23/2012   ESRD (end stage  renal disease) (HCC) 02/05/2012   Injury to blood vessels, unspecified site 02/05/2012   Cellulitis and abscess of face 12/14/2011   Essential hypertension 11/27/2011   Pain in joint, shoulder region 09/14/2011   Sprain and strain of shoulder and upper arm 09/05/2011   Infective otitis externa 05/17/2011   PCP:  Richardean Chimera, MD Pharmacy:   [facility] [place] - [address] [place] [address] Phone: [phone] Fax: [phone]     Social Determinants of Health (SDOH) Interventions    Readmission Risk Interventions    12/04/2021   10:48  AM 04/07/2020   12:15 PM 05/01/2019    2:07 PM  Readmission Risk Prevention Plan  Post Dischage Appt  Complete   Medication Screening  Complete   Transportation Screening Complete Complete Complete  HRI or Home Care Consult Complete    Social Work Consult for Recovery Care Planning/Counseling Complete    Palliative Care Screening Not Applicable    Medication Review Oceanographer) Complete  Complete  PCP or Specialist appointment within 3-5 days of discharge   Complete  HRI or Home Care Consult   Complete  SW Recovery Care/Counseling Consult   Complete  Palliative Care Screening   Not Applicable  Skilled Nursing Facility   Not Applicable

## 2021-12-05 DIAGNOSIS — Z7189 Other specified counseling: Secondary | ICD-10-CM

## 2021-12-05 DIAGNOSIS — Z515 Encounter for palliative care: Secondary | ICD-10-CM

## 2021-12-05 DIAGNOSIS — E1122 Type 2 diabetes mellitus with diabetic chronic kidney disease: Secondary | ICD-10-CM | POA: Diagnosis not present

## 2021-12-05 DIAGNOSIS — N186 End stage renal disease: Secondary | ICD-10-CM | POA: Diagnosis not present

## 2021-12-05 DIAGNOSIS — I5023 Acute on chronic systolic (congestive) heart failure: Secondary | ICD-10-CM | POA: Diagnosis not present

## 2021-12-05 DIAGNOSIS — J9601 Acute respiratory failure with hypoxia: Secondary | ICD-10-CM | POA: Diagnosis not present

## 2021-12-05 LAB — CBC
HCT: 28.4 % — ABNORMAL LOW (ref 39.0–52.0)
Hemoglobin: 9.1 g/dL — ABNORMAL LOW (ref 13.0–17.0)
MCH: 33.3 pg (ref 26.0–34.0)
MCHC: 32 g/dL (ref 30.0–36.0)
MCV: 104 fL — ABNORMAL HIGH (ref 80.0–100.0)
Platelets: 155 10*3/uL (ref 150–400)
RBC: 2.73 MIL/uL — ABNORMAL LOW (ref 4.22–5.81)
RDW: 20.1 % — ABNORMAL HIGH (ref 11.5–15.5)
WBC: 5.2 10*3/uL (ref 4.0–10.5)
nRBC: 0 % (ref 0.0–0.2)

## 2021-12-05 LAB — GLUCOSE, CAPILLARY
Glucose-Capillary: 170 mg/dL — ABNORMAL HIGH (ref 70–99)
Glucose-Capillary: 236 mg/dL — ABNORMAL HIGH (ref 70–99)
Glucose-Capillary: 149 mg/dL — ABNORMAL HIGH (ref 70–99)
Glucose-Capillary: 135 mg/dL — ABNORMAL HIGH (ref 70–99)

## 2021-12-05 LAB — BASIC METABOLIC PANEL
Anion gap: 11 (ref 5–15)
BUN: 40 mg/dL — ABNORMAL HIGH (ref 8–23)
CO2: 29 mmol/L (ref 22–32)
Calcium: 8.9 mg/dL (ref 8.9–10.3)
Chloride: 99 mmol/L (ref 98–111)
Creatinine, Ser: 5.53 mg/dL — ABNORMAL HIGH (ref 0.61–1.24)
GFR, Estimated: 10 mL/min — ABNORMAL LOW (ref >60)
Glucose, Bld: 142 mg/dL — ABNORMAL HIGH (ref 70–99)
Potassium: 4.2 mmol/L (ref 3.5–5.1)
Sodium: 139 mmol/L (ref 135–145)

## 2021-12-05 MED ORDER — OXYCODONE HCL 5 MG PO TABS
10.0000 mg | ORAL_TABLET | Freq: Three times a day (TID) | ORAL | Status: DC
  Administered 2021-12-05 – 2021-12-06 (×5): 10 mg via ORAL
  Filled 2021-12-05 (×5): qty 2

## 2021-12-05 MED ORDER — ALBUMIN HUMAN 25 % IV SOLN
25.0000 g | Freq: Once | INTRAVENOUS | Status: AC
  Administered 2021-12-05: 25 g via INTRAVENOUS
  Filled 2021-12-05: qty 100

## 2021-12-05 NOTE — Progress Notes (Signed)
[facility]) Hospital Liaison note: ? ?This is a pending outpatient-based Palliative Care patient. Will continue to follow for disposition. ? ?Please call with any outpatient palliative questions or concerns. ? ?Thank you, ?Lorelee Market, LPN ?[facility] Hospital Liaison ?[phone] ?

## 2021-12-05 NOTE — Progress Notes (Signed)
Patient ID: [name], male   DOB: Sep 27, 1950, 71 y.o.   MRN: [id number] ?S: Feeling a little better this morning.  Only able to UF 2 liters with HD due to hypotension and bradycardia. ?O:BP 130/66 (BP Location: Right Arm)   Pulse 74   Temp 97.6 ?F (36.4 ?C) (Oral)   Resp 18   Ht '6\' 4"'$  (1.93 m)   Wt 105.3 kg   SpO2 95%   BMI 28.26 kg/m?  ? ?Intake/Output Summary (Last 24 hours) at 12/05/2021 0854 ?Last data filed at 12/05/2021 0500 ?Gross per 24 hour  ?Intake 819.86 ml  ?Output 2000 ml  ?Net -1180.14 ml  ? ?Intake/Output: ?I/O last 3 completed shifts: ?In: 819.9 [P.O.:720; IV Piggyback:99.9] ?Out: 2000 [Other:2000] ? Intake/Output this shift: ? No intake/output data recorded. ?Weight change: -1.6 kg ?Gen:NAD ?CVS: III/VI SEM at LUSB, no rub ?Resp:scattered rhonchi and rales at bases ?Abd:+BS, soft, NT/ND ?Ext: s/p RAKA, 1+ edema of LLE with bandages in place, LUE AVF +T/B ? ?Recent Labs  ?Lab 12/03/21 ?1516 12/04/21 ?0418  ?NA 141 142  ?K 4.3 4.2  ?CL 102 104  ?CO2 29 28  ?GLUCOSE 179* 147*  ?BUN 42* 47*  ?CREATININE 5.66* 6.36*  ?ALBUMIN 2.9*  --   ?CALCIUM 9.1 9.0  ?AST 56*  --   ?ALT 55*  --   ? ?Liver Function Tests: ?Recent Labs  ?Lab 12/03/21 ?1516  ?AST 56*  ?ALT 55*  ?ALKPHOS 155*  ?BILITOT 1.0  ?PROT 6.6  ?ALBUMIN 2.9*  ? ?No results for input(s): LIPASE, AMYLASE in the last 168 hours. ?No results for input(s): AMMONIA in the last 168 hours. ?CBC: ?Recent Labs  ?Lab 12/03/21 ?1312 12/04/21 ?0418  ?WBC 5.5 5.5  ?NEUTROABS 3.8  --   ?HGB 10.4* 8.7*  ?HCT 32.8* 27.7*  ?MCV 103.8* 104.9*  ?PLT 134* 123*  ? ?Cardiac Enzymes: ?No results for input(s): CKTOTAL, CKMB, CKMBINDEX, TROPONINI in the last 168 hours. ?CBG: ?Recent Labs  ?Lab 12/04/21 ?0706 12/04/21 ?1108 12/04/21 ?1756 12/04/21 ?2043 12/05/21 ?0717  ?GLUCAP 119* 130* 130* 203* 170*  ? ? ?Iron Studies: No results for input(s): IRON, TIBC, TRANSFERRIN, FERRITIN in the last 72 hours. ?Studies/Results: ?DG Chest Port 1 View ? ?Result Date:  12/03/2021 ?CLINICAL DATA:  Cough and shortness of breath today. EXAM: PORTABLE CHEST 1 VIEW COMPARISON:  November 19, 2021 FINDINGS: Tortuosity and calcific atherosclerotic disease of the aorta. Enlarged cardiac silhouette. Streaky interstitial opacities throughout both lungs, most pronounced in the left lower lung field. Osseous structures are without acute abnormality. Soft tissues are grossly normal. IMPRESSION: 1. Streaky interstitial opacities throughout both lungs, most pronounced in the left lower lung field. This may represent interstitial pulmonary edema or atypical pneumonia. 2. Enlarged cardiac silhouette. Electronically Signed   By: Fidela Salisbury M.D.   On: 12/03/2021 14:33   ? atorvastatin  80 mg Oral QHS  ? calcitRIOL  0.25 mcg Oral Q T,Th,Sat-1800  ? calcium acetate  1,334 mg Oral TID WC  ? Chlorhexidine Gluconate Cloth  6 each Topical Q0600  ? clopidogrel  75 mg Oral Daily  ? heparin  5,000 Units Subcutaneous Q8H  ? insulin aspart  0-5 Units Subcutaneous QHS  ? insulin aspart  0-6 Units Subcutaneous TID WC  ? ipratropium-albuterol  3 mL Nebulization Q6H WA  ? isosorbide mononitrate  15 mg Oral Daily  ? levothyroxine  150 mcg Oral Q0600  ? metoprolol tartrate  12.5 mg Oral BID  ? midodrine  10 mg Oral  Q M,W,F  ? multivitamin  1 tablet Oral Daily  ? nutrition supplement (JUVEN)  1 packet Oral BID BM  ? oxyCODONE  10 mg Oral Q8H  ? polyethylene glycol  17 g Oral Daily  ? pregabalin  150 mg Oral BID  ? senna  1 tablet Oral BID  ? ? ?BMET ?   ?Component Value Date/Time  ? NA 142 12/04/2021 0418  ? NA 134 02/08/2017 1403  ? K 4.2 12/04/2021 0418  ? CL 104 12/04/2021 0418  ? CO2 28 12/04/2021 0418  ? GLUCOSE 147 (H) 12/04/2021 0418  ? BUN 47 (H) 12/04/2021 0418  ? BUN 67 (H) 02/08/2017 1403  ? CREATININE 6.36 (H) 12/04/2021 0418  ? CALCIUM 9.0 12/04/2021 0418  ? GFRNONAA 9 (L) 12/04/2021 0418  ? GFRAA 6 (L) 04/07/2020 0248  ? ?CBC ?   ?Component Value Date/Time  ? WBC 5.5 12/04/2021 0418  ? RBC 2.64 (L)  12/04/2021 0418  ? HGB 8.7 (L) 12/04/2021 0418  ? HGB 11.5 (L) 02/08/2017 1409  ? HCT 27.7 (L) 12/04/2021 0418  ? HCT 34.3 (L) 02/08/2017 1409  ? PLT 123 (L) 12/04/2021 0418  ? PLT 208 02/08/2017 1409  ? MCV 104.9 (H) 12/04/2021 0418  ? MCV 94 02/08/2017 1409  ? MCH 33.0 12/04/2021 0418  ? MCHC 31.4 12/04/2021 0418  ? RDW 20.4 (H) 12/04/2021 0418  ? RDW 16.7 (H) 02/08/2017 1409  ? LYMPHSABS 0.8 12/03/2021 1312  ? LYMPHSABS 2.1 02/08/2017 1409  ? MONOABS 0.7 12/03/2021 1312  ? EOSABS 0.1 12/03/2021 1312  ? EOSABS 0.2 02/08/2017 1409  ? BASOSABS 0.0 12/03/2021 1312  ? BASOSABS 0.0 02/08/2017 1409  ? ? ?Outpatient dialysis unit: [facility] ?Outpatient dialysis schedule: TTS ?Script: 2K, 2.5 Ca, EDW 106, DFR 500, 4.5hrs, NIPRO, LUE AVF, heparin 3500 units loading then 2000 mid treatment, sensipar '30mg'$  TTS, calcitriol 0.45mg TTS ?  ?Assessment/Plan: ? Acute hypoxic respiratory failure - presumably due to volume overload related to severe aortic stenosis and hypotension with HD.   ? Acute systolic CHF exacerbation - as above.  Poor prognosis as he is not a surgical candidate for his AS or CAD. ? Severe aortic stenosis - not an operative candidate ? ESRD -  Normally TTS but had extra HD yesterday for volume management.  He still had issues with hypotension and bradycardia which impaired UF.  Will plan on another session of HD today and UF as tolerated.  He will likely require 4 days of HD per week to accommodate his poor cardiac function.  MonTuesThurSat as an outpatient as he does not want to stop HD. ? Hypertension/volume  - as above, UF as tolerated ? Anemia  - transfuse if drops below 8 ? Metabolic bone disease -  continue with home meds ? Nutrition - renal diet, carb modified. ? Disposition - poor overall prognosis given multiple end-stage disease processes and co-morbidities.  If he cannot tolerate IHD, would recommend transition to comfort care/hospice.  I had a frank conversation with him regarding his inability  to tolerate UF with HD.  He does not want to transition to hospice at this time.  He wants to try to keep going with HD. Will need 4 days/week given inability to UF. ?  ? ?JDonetta Potts MD ?[facility] ? ? ?

## 2021-12-05 NOTE — Procedures (Signed)
Net UF 1356 ml removed. Tx completed with sustained hypotension despite medication being administered and bolus. Pt was sluggish to respond mid treatment, appearing to be resting with eyes closed. Bradycardia during and post tx.  BP improved beyond baseline at rinse back 114/60.  Homeostasis achieved. No acute distress at this time.  Pt is conversational post treatment.                                                                       ? ?

## 2021-12-05 NOTE — Progress Notes (Signed)
?PROGRESS NOTE ? ? ? ?[name]  YHC:[id number] DOB: [birth year] DOA: 12/03/2021 ?PCP: Caryl Bis, MD ? ? ?Brief Narrative:  ?[name] is a 71 y.o. male with medical history significant of severe systolic congestive heart failure, severe aortic stenosis, ESRD on HD TTS, COPD on room air, CAD, HTN, PVD status post right AKA, chronic left lower extremity wound, and insulin-dependent DM2, GERD, hypothyroidism, who presents today with what appears to be volume overload and acute hypoxic respiratory failure.  In the ED patient presented with BiPAP in place per EMS, able to be weaned to 4 L nasal cannula in the ED with supportive care.  Nephrology was consulted and hospitalist called for admission. ?  ?Of note patient was just discharged from our facility on 11/24/2021 for newly documented severe aortic stenosis, syncope, and newly diagnosed severe systolic congestive heart failure with plans at that time to follow-up with palliative care in the outpatient setting.  Patient carries a DNR form with him at intake but wishes to be returned to full code per discussion on 12/04/2021. ? ?Assessment & Plan: ?  ?Principal Problem: ?  Acute exacerbation of congestive heart failure ([place]) ?Active Problems: ?  ESRD (end stage renal disease) ([place]) ?  Peripheral vascular disease, unspecified ([place]) ?  Acute respiratory failure with hypoxia ([place]) ?  Diabetes mellitus with end stage renal disease ([place]) ?  Diabetic peripheral neuropathy ([place]) ?  Anemia in chronic kidney disease ?  Essential hypertension ?  DMII (diabetes mellitus, type 2) ([place]) ?  Type 2 diabetes mellitus with diabetic neuropathy ([facility]) ?  GERD (gastroesophageal reflux disease) ?  Severe aortic stenosis by prior echocardiogram ?  Pressure injury of skin ? ? ?Acute hypoxic respiratory failure multifactorial; ?Secondary to acute exacerbation of systolic heart failure ?Volume overload in the setting of ESRD ?Concern for noncompliance ?Severe aortic stenosis ?Rule  out COPD exacerbation ?-Patient unfortunately has multiple comorbid conditions contributing to his acute hypoxic respiratory failure. ?-Patient's heart failure is somewhat difficult to control given his ESRD status and inability to tolerate diuretics at home given poor urine output -patient's home diet apparently has not been as tightly controlled as one would hope for, continues to eat deli meat and canned food on occasion. ?-Nephrology consulted, last dialysis Saturday (22nd) -nephrology recommending 4 day/week dialysis to accommodate poor cardiac function given his current EF and severe aortic stenosis.  Recommending a Monday Tuesday Thursday Saturday regimen. ?-Continue supportive care including supplemental oxygen, BiPAP as needed  ?-Resume home medications including isosorbide, metoprolol ?-Continue hypertensive control as below ?  ?Goals of care ?-Palliative care following, appreciate insight and recommendations ?-Lengthy discussion today at bedside along with palliative care about patient's prognosis, limited dialysis in the setting of profound heart failure with severe aortic stenosis might limit his ability to tolerate dialysis in the near future. ?-Patient planning on speaking to family about transferring back to DNR status ?-Patient wishes to be changed back to full CODE STATUS today 12/04/2021, we had a lengthy discussion about goals of care, given his multiple comorbidities and our concern for increased morbidity/mortality ?  ?Lactic acidosis ?Rule out infectious process (Cellulitis -left leg - presumed at intake), POA ?Likely in the setting of volume status, patient does not meet sepsis criteria ?Procalcitonin elevated >2; cannot give IVF due to ESRD  ?Cover presumed cellulitis with ceftriaxone until cultures result ?Questionable chest xray findings secondary to volume status as above given bilateral nature ?  ?CAD -continue statin, Plavix beta-blocker ?Hypertension -continue metoprolol,  isosorbide  (midodrine on dialysis days) ?PVD status post right AKA -continue Plavix statin beta-blocker ?Noninsulin-dependent diabetes mellitus type 2 -continue sliding scale insulin, hypoglycemic protocol ?GERD  ?Hypothyroidism -resume home levothyroxine ?  ?DVT prophylaxis: Heparin ?Code Status: DNR ?Family Communication: At bedside ? ?Status is: Inpatient ? ?Dispo: The patient is from: Home ?             Anticipated d/c is to: Home ?             Anticipated d/c date is: 24-48h ?             Patient currently not medically stable for discharge ? ?Consultants:  ?Nephrology ? ?Procedures:  ?None ? ?Antimicrobials:  ?Ceftriaxone ? ?Subjective: ?No acute issues or events overnight denies nausea vomiting diarrhea constipation headache fevers chills or chest pain; shortness of breath and orthopnea ongoing but markedly improved since admission.  Tolerated dialysis well yesterday. ? ?Objective: ?Vitals:  ? 12/04/21 1931 12/04/21 2042 12/05/21 0113 12/05/21 0500  ?BP:  130/66    ?Pulse:  74    ?Resp:  18    ?Temp:  97.6 ?F (36.4 ?C)    ?TempSrc:  Oral    ?SpO2: 92% 100% 94%   ?Weight:    105.3 kg  ?Height:      ? ? ?Intake/Output Summary (Last 24 hours) at 12/05/2021 0716 ?Last data filed at 12/05/2021 0500 ?Gross per 24 hour  ?Intake 819.86 ml  ?Output 2000 ml  ?Net -1180.14 ml  ? ? ?Filed Weights  ? 12/04/21 0554 12/04/21 1258 12/05/21 0500  ?Weight: 106.7 kg 106.7 kg 105.3 kg  ? ? ?Examination: ? ?General exam: Appears calm and comfortable  ?Respiratory system: Clear to auscultation. Respiratory effort normal. ?Cardiovascular system: S1 & S2 heard, RRR. No JVD, murmurs, rubs, gallops or clicks. No pedal edema. ?Gastrointestinal system: Abdomen is nondistended, soft and nontender. No organomegaly or masses felt. Normal bowel sounds heard. ?Central nervous system: Alert and oriented. No focal neurological deficits. ?Extremities: Symmetric 5 x 5 power. ?Skin: No rashes, lesions or ulcers ?Psychiatry: Judgement and insight appear  normal. Mood & affect appropriate.  ? ? ? ?Data Reviewed: I have personally reviewed following labs and imaging studies ? ?CBC: ?Recent Labs  ?Lab 12/03/21 ?1312 12/04/21 ?0418  ?WBC 5.5 5.5  ?NEUTROABS 3.8  --   ?HGB 10.4* 8.7*  ?HCT 32.8* 27.7*  ?MCV 103.8* 104.9*  ?PLT 134* 123*  ? ? ?Basic Metabolic Panel: ?Recent Labs  ?Lab 12/03/21 ?1516 12/04/21 ?0418  ?NA 141 142  ?K 4.3 4.2  ?CL 102 104  ?CO2 29 28  ?GLUCOSE 179* 147*  ?BUN 42* 47*  ?CREATININE 5.66* 6.36*  ?CALCIUM 9.1 9.0  ? ? ?GFR: ?Estimated Creatinine Clearance: 14.2 mL/min (A) (by C-G formula based on SCr of 6.36 mg/dL (H)). ?Liver Function Tests: ?Recent Labs  ?Lab 12/03/21 ?1516  ?AST 56*  ?ALT 55*  ?ALKPHOS 155*  ?BILITOT 1.0  ?PROT 6.6  ?ALBUMIN 2.9*  ? ? ?No results for input(s): LIPASE, AMYLASE in the last 168 hours. ?No results for input(s): AMMONIA in the last 168 hours. ?Coagulation Profile: ?Recent Labs  ?Lab 12/03/21 ?1312  ?INR 1.4*  ? ? ?Cardiac Enzymes: ?No results for input(s): CKTOTAL, CKMB, CKMBINDEX, TROPONINI in the last 168 hours. ?BNP (last 3 results) ?No results for input(s): PROBNP in the last 8760 hours. ?HbA1C: ?No results for input(s): HGBA1C in the last 72 hours. ?CBG: ?Recent Labs  ?Lab 12/03/21 ?2042 12/04/21 ?0706 12/04/21 ?1108 12/04/21 ?1756 12/04/21 ?  2043  ?GLUCAP 144* 119* 130* 130* 203*  ? ? ?Lipid Profile: ?No results for input(s): CHOL, HDL, LDLCALC, TRIG, CHOLHDL, LDLDIRECT in the last 72 hours. ?Thyroid Function Tests: ?No results for input(s): TSH, T4TOTAL, FREET4, T3FREE, THYROIDAB in the last 72 hours. ?Anemia Panel: ?No results for input(s): VITAMINB12, FOLATE, FERRITIN, TIBC, IRON, RETICCTPCT in the last 72 hours. ?Sepsis Labs: ?Recent Labs  ?Lab 12/03/21 ?1354 12/03/21 ?1516  ?PROCALCITON  --  2.65  ?LATICACIDVEN 2.0*  --   ? ? ? ?Recent Results (from the past 240 hour(s))  ?Blood Culture (routine x 2)     Status: None (Preliminary result)  ? Collection Time: 12/03/21  1:21 PM  ? Specimen: BLOOD  ?Result  Value Ref Range Status  ? Specimen Description BLOOD BLOOD RIGHT FOREARM  Final  ? Special Requests   Final  ?  BOTTLES DRAWN AEROBIC AND ANAEROBIC Blood Culture adequate volume ?Performed at [facility]

## 2021-12-05 NOTE — Progress Notes (Signed)
Pt in dialysis

## 2021-12-06 DIAGNOSIS — Z66 Do not resuscitate: Secondary | ICD-10-CM

## 2021-12-06 DIAGNOSIS — Z7189 Other specified counseling: Secondary | ICD-10-CM | POA: Diagnosis not present

## 2021-12-06 DIAGNOSIS — I5023 Acute on chronic systolic (congestive) heart failure: Secondary | ICD-10-CM | POA: Diagnosis not present

## 2021-12-06 DIAGNOSIS — N186 End stage renal disease: Secondary | ICD-10-CM | POA: Diagnosis not present

## 2021-12-06 LAB — BASIC METABOLIC PANEL
Anion gap: 11 (ref 5–15)
BUN: 26 mg/dL — ABNORMAL HIGH (ref 8–23)
CO2: 29 mmol/L (ref 22–32)
Calcium: 9.1 mg/dL (ref 8.9–10.3)
Chloride: 99 mmol/L (ref 98–111)
Creatinine, Ser: 4 mg/dL — ABNORMAL HIGH (ref 0.61–1.24)
GFR, Estimated: 15 mL/min — ABNORMAL LOW (ref >60)
Glucose, Bld: 151 mg/dL — ABNORMAL HIGH (ref 70–99)
Potassium: 3.9 mmol/L (ref 3.5–5.1)
Sodium: 139 mmol/L (ref 135–145)

## 2021-12-06 LAB — GLUCOSE, CAPILLARY
Glucose-Capillary: 164 mg/dL — ABNORMAL HIGH (ref 70–99)
Glucose-Capillary: 190 mg/dL — ABNORMAL HIGH (ref 70–99)
Glucose-Capillary: 162 mg/dL — ABNORMAL HIGH (ref 70–99)
Glucose-Capillary: 207 mg/dL — ABNORMAL HIGH (ref 70–99)

## 2021-12-06 LAB — CBC
HCT: 29.1 % — ABNORMAL LOW (ref 39.0–52.0)
Hemoglobin: 9.2 g/dL — ABNORMAL LOW (ref 13.0–17.0)
MCH: 33 pg (ref 26.0–34.0)
MCHC: 31.6 g/dL (ref 30.0–36.0)
MCV: 104.3 fL — ABNORMAL HIGH (ref 80.0–100.0)
Platelets: 134 10*3/uL — ABNORMAL LOW (ref 150–400)
RBC: 2.79 MIL/uL — ABNORMAL LOW (ref 4.22–5.81)
RDW: 19.8 % — ABNORMAL HIGH (ref 11.5–15.5)
WBC: 4.8 10*3/uL (ref 4.0–10.5)
nRBC: 0 % (ref 0.0–0.2)

## 2021-12-06 MED ORDER — ALPRAZOLAM 0.5 MG PO TABS
0.5000 mg | ORAL_TABLET | Freq: Three times a day (TID) | ORAL | Status: DC | PRN
  Administered 2021-12-06 – 2021-12-07 (×2): 0.5 mg via ORAL
  Filled 2021-12-06 (×2): qty 1

## 2021-12-06 MED ORDER — OXYCODONE HCL 5 MG PO TABS
10.0000 mg | ORAL_TABLET | Freq: Four times a day (QID) | ORAL | Status: DC | PRN
  Administered 2021-12-06 – 2021-12-07 (×3): 10 mg via ORAL
  Filled 2021-12-06 (×3): qty 2

## 2021-12-06 MED ORDER — CEFAZOLIN SODIUM-DEXTROSE 1-4 GM/50ML-% IV SOLN
1.0000 g | INTRAVENOUS | Status: DC
  Administered 2021-12-06: 1 g via INTRAVENOUS
  Filled 2021-12-06 (×2): qty 50

## 2021-12-06 NOTE — Progress Notes (Signed)
Patient ID: [name], male   DOB: Feb 21, 1951, 71 y.o.   MRN: [id number] ?S: no acute events overnight. S/p HD yesterday, net uf 1356cc. Has intermittent issues w/ hypotension and bradycardia. BP stable at the end of treatment. He reports that his hand swelling is slightly better. He is eager to go home.  ?O:BP 115/69 (BP Location: Right Arm)   Pulse 73   Temp 98 ?F (36.7 ?C)   Resp 19   Ht '6\' 4"'$  (1.93 m)   Wt 106.7 kg   SpO2 93%   BMI 28.63 kg/m?  ? ?Intake/Output Summary (Last 24 hours) at 12/06/2021 0849 ?Last data filed at 12/06/2021 0500 ?Gross per 24 hour  ?Intake 720 ml  ?Output 1356 ml  ?Net -636 ml  ? ?Intake/Output: ?I/O last 3 completed shifts: ?In: 1033.7 [P.O.:960; IV Piggyback:73.7] ?Out: 1356 [Other:1356] ? Intake/Output this shift: ? No intake/output data recorded. ?Weight change: -1.4 kg ?Gen:NAD ?CVS: [id number]. +systolic murmur ?Resp: bibasilar rales, sitting up in bed, no inc'd WOB ?Abd:+BS, soft, NT/ND ?Ext: s/p RAKA, 1+ edema of LLE with bandages in place, LUE AVF +T/B ? ?Recent Labs  ?Lab 12/03/21 ?1516 12/04/21 ?1191 12/05/21 ?1647 12/06/21 ?0356  ?NA 141 142 139 139  ?K 4.3 4.2 4.2 3.9  ?CL 102 104 99 99  ?CO2 '29 28 29 29  '$ ?GLUCOSE 179* 147* 142* 151*  ?BUN 42* 47* 40* 26*  ?CREATININE 5.66* 6.36* 5.53* 4.00*  ?ALBUMIN 2.9*  --   --   --   ?CALCIUM 9.1 9.0 8.9 9.1  ?AST 56*  --   --   --   ?ALT 55*  --   --   --   ? ?Liver Function Tests: ?Recent Labs  ?Lab 12/03/21 ?1516  ?AST 56*  ?ALT 55*  ?ALKPHOS 155*  ?BILITOT 1.0  ?PROT 6.6  ?ALBUMIN 2.9*  ? ?No results for input(s): LIPASE, AMYLASE in the last 168 hours. ?No results for input(s): AMMONIA in the last 168 hours. ?CBC: ?Recent Labs  ?Lab 12/03/21 ?1312 12/04/21 ?0418 12/05/21 ?1647 12/06/21 ?0356  ?WBC 5.5 5.5 5.2 4.8  ?NEUTROABS 3.8  --   --   --   ?HGB 10.4* 8.7* 9.1* 9.2*  ?HCT 32.8* 27.7* 28.4* 29.1*  ?MCV 103.8* 104.9* 104.0* 104.3*  ?PLT 134* 123* 155 134*  ? ?Cardiac Enzymes: ?No results for input(s): CKTOTAL, CKMB,  CKMBINDEX, TROPONINI in the last 168 hours. ?CBG: ?Recent Labs  ?Lab 12/05/21 ?0717 12/05/21 ?1121 12/05/21 ?1611 12/05/21 ?2137 12/06/21 ?0710  ?GLUCAP 170* 236* 149* 135* 164*  ? ? ?Iron Studies: No results for input(s): IRON, TIBC, TRANSFERRIN, FERRITIN in the last 72 hours. ?Studies/Results: ?No results found. ? ? atorvastatin  80 mg Oral QHS  ? calcitRIOL  0.25 mcg Oral Q T,Th,Sat-1800  ? calcium acetate  1,334 mg Oral TID WC  ? Chlorhexidine Gluconate Cloth  6 each Topical Q0600  ? clopidogrel  75 mg Oral Daily  ? heparin  5,000 Units Subcutaneous Q8H  ? insulin aspart  0-5 Units Subcutaneous QHS  ? insulin aspart  0-6 Units Subcutaneous TID WC  ? ipratropium-albuterol  3 mL Nebulization Q6H WA  ? isosorbide mononitrate  15 mg Oral Daily  ? levothyroxine  150 mcg Oral Q0600  ? metoprolol tartrate  12.5 mg Oral BID  ? midodrine  10 mg Oral Q M,W,F  ? multivitamin  1 tablet Oral Daily  ? nutrition supplement (JUVEN)  1 packet Oral BID BM  ? oxyCODONE  10 mg Oral  Q8H  ? polyethylene glycol  17 g Oral Daily  ? pregabalin  150 mg Oral BID  ? senna  1 tablet Oral BID  ? ? ?BMET ?   ?Component Value Date/Time  ? NA 139 12/06/2021 0356  ? NA 134 02/08/2017 1403  ? K 3.9 12/06/2021 0356  ? CL 99 12/06/2021 0356  ? CO2 29 12/06/2021 0356  ? GLUCOSE 151 (H) 12/06/2021 0356  ? BUN 26 (H) 12/06/2021 0356  ? BUN 67 (H) 02/08/2017 1403  ? CREATININE 4.00 (H) 12/06/2021 0356  ? CALCIUM 9.1 12/06/2021 0356  ? GFRNONAA 15 (L) 12/06/2021 0356  ? GFRAA 6 (L) 04/07/2020 0248  ? ?CBC ?   ?Component Value Date/Time  ? WBC 4.8 12/06/2021 0356  ? RBC 2.79 (L) 12/06/2021 0356  ? HGB 9.2 (L) 12/06/2021 0356  ? HGB 11.5 (L) 02/08/2017 1409  ? HCT 29.1 (L) 12/06/2021 0356  ? HCT 34.3 (L) 02/08/2017 1409  ? PLT 134 (L) 12/06/2021 0356  ? PLT 208 02/08/2017 1409  ? MCV 104.3 (H) 12/06/2021 0356  ? MCV 94 02/08/2017 1409  ? MCH 33.0 12/06/2021 0356  ? MCHC 31.6 12/06/2021 0356  ? RDW 19.8 (H) 12/06/2021 0356  ? RDW 16.7 (H) 02/08/2017 1409   ? LYMPHSABS 0.8 12/03/2021 1312  ? LYMPHSABS 2.1 02/08/2017 1409  ? MONOABS 0.7 12/03/2021 1312  ? EOSABS 0.1 12/03/2021 1312  ? EOSABS 0.2 02/08/2017 1409  ? BASOSABS 0.0 12/03/2021 1312  ? BASOSABS 0.0 02/08/2017 1409  ? ? ?Outpatient dialysis unit: [facility] ?Outpatient dialysis schedule: TTS ?Script: 2K, 2.5 Ca, EDW 106, DFR 500, 4.5hrs, NIPRO, LUE AVF, heparin 3500 units loading then 2000 mid treatment, sensipar '30mg'$  TTS, calcitriol 0.53mg TTS ?  ?Assessment/Plan: ? Acute hypoxic respiratory failure - presumably due to volume overload related to severe aortic stenosis and hypotension with HD.  Uf'ing as tolerated, HD 4 x week ? Acute systolic CHF exacerbation - as above.  Poor prognosis as he is not a surgical candidate for his AS or CAD. ? Severe aortic stenosis - not an operative candidate ? ESRD -  Normally TTS.  He still had issues with hypotension and bradycardia which impaired UF.  Will likely require 4 days of HD per week to accommodate his poor cardiac function.  MonTuesThurSat as an outpatient as he does not want to stop HD. Next HD tomorrow ? Hypertension/volume  - as above, UF as tolerated ? Anemia  - transfuse if drops below 8 ? Metabolic bone disease -  continue with home meds ? Nutrition - renal diet, carb modified. ? Disposition - poor overall prognosis given multiple end-stage disease processes and co-morbidities.  If he cannot tolerate IHD, would recommend transition to comfort care/hospice. He does not want to transition to hospice at this time.  He wants to try to keep going with HD. Will need 4 days/week given inability to UF. Will need SW assistance to obtain a HD chair for him on Mondays. ?  ? ?VGean Quint MD ?[facility] ? ? ?

## 2021-12-06 NOTE — Progress Notes (Signed)
?PROGRESS NOTE ? ? ? ?[name]  TWS:[id number] DOB: Jun 29, 1951 DOA: 12/03/2021 ?PCP: Caryl Bis, MD ? ? ?Brief Narrative:  ?[name] is a 71 y.o. male with medical history significant of severe systolic congestive heart failure, severe aortic stenosis, ESRD on HD TTS, COPD on room air, CAD, HTN, PVD status post right AKA, chronic left lower extremity wound, and insulin-dependent DM2, GERD, hypothyroidism, who presents today with what appears to be volume overload and acute hypoxic respiratory failure.  In the ED patient presented with BiPAP in place per EMS, able to be weaned to 4 L nasal cannula in the ED with supportive care.  Nephrology was consulted and hospitalist called for admission. ?  ?Of note patient was just discharged from our facility on 11/24/2021 for newly documented severe aortic stenosis, syncope, and newly diagnosed severe systolic congestive heart failure with plans at that time to follow-up with palliative care in the outpatient setting.  Patient has decided to be transition back to DNR status; (12/06/2021). ? ?Assessment & Plan: ?  ?Principal Problem: ?  Acute exacerbation of congestive heart failure ([place]) ?Active Problems: ?  ESRD (end stage renal disease) ([place]) ?  Peripheral vascular disease, unspecified ([place]) ?  Acute respiratory failure with hypoxia ([place]) ?  Diabetes mellitus with end stage renal disease ([place]) ?  Diabetic peripheral neuropathy ([place]) ?  Anemia in chronic kidney disease ?  Essential hypertension ?  DMII (diabetes mellitus, type 2) ([place]) ?  Type 2 diabetes mellitus with diabetic neuropathy ([facility]) ?  GERD (gastroesophageal reflux disease) ?  Severe aortic stenosis by prior echocardiogram ?  Pressure injury of skin ? ? ?Acute hypoxic respiratory failure multifactorial; ?Secondary to acute exacerbation of systolic heart failure ?Volume overload in the setting of ESRD ?Concern for noncompliance ?Severe aortic stenosis ?Rule out COPD exacerbation ?-Patient  unfortunately has multiple comorbid conditions contributing to his acute hypoxic respiratory failure. ?-Patient's heart failure is somewhat difficult to control given his ESRD status and inability to tolerate diuretics at home given poor urine output.  ?-patient's home diet apparently has not been as tightly controlled as one would hope for, but continues to eat deli meat and canned food on occasion. ?-nephrology recommending 4 day/week dialysis to accommodate poor cardiac function given his current EF and severe aortic stenosis.  Recommending a Monday Tuesday Thursday Saturday regimen. ?-Continue supportive care including supplemental oxygen and wean off as tolerated. ?-Resume home medications including isosorbide, metoprolol. ?  ?Goals of care ?-Palliative care following, appreciate insight and recommendations ?-Lengthy discussion today at bedside along with palliative care about patient's prognosis, limited dialysis in the setting of profound heart failure with severe aortic stenosis might limit his ability to tolerate dialysis in the near future. ?-Patient has decided to be transition back to DNR status. ?-Continue to treat what is treatable with intention to be able to go home while pursuing dialysis as tolerated. ?  ?Lactic acidosis ?Rule out infectious process (Cellulitis -left leg - presumed at intake), POA ?-Likely in the setting of volume status, patient does not meet sepsis criteria ?-Procalcitonin elevated >2; cannot give IVF due to ESRD  ?-Covering presumed cellulitis with ancef now (narrowing antibiotics). ?-Questionable chest xray findings secondary to volume status as above given bilateral nature ?  ?CAD -continue statin, Plavix beta-blocker.  Patient denies chest pain. ?Hypertension -continue metoprolol, isosorbide (midodrine on dialysis days).  Overall blood pressure stable. ?PVD status post right AKA -continue Plavix, statin and beta-blocker ?Noninsulin-dependent diabetes mellitus type 2 -continue  sliding scale insulin, hypoglycemic protocol ?GERD: Continue PPI. ?Hypothyroidism -continue levothyroxine. ?Chronic pain syndrome: Resume home analgesic regimen. ?  ?DVT prophylaxis: Heparin ?Code Status: DNR ?Family Communication: At bedside ? ?Status is: Inpatient ? ?Dispo: The patient is from: Home ?             Anticipated d/c is to: Home ?             Anticipated d/c date is: 24-48h ?             Patient currently not medically stable for discharge ? ?Consultants:  ?Nephrology ? ?Procedures:  ?None ? ?Antimicrobials:  ?Ceftriaxone; currently narrow to Ancef. ? ?Subjective: ?No acute issues or events overnight denies nausea vomiting diarrhea constipation headache fevers chills or chest pain; shortness of breath and orthopnea ongoing but markedly improved since admission.  Tolerated dialysis well yesterday. ? ?Objective: ?Vitals:  ? 12/06/21 0756 12/06/21 0911 12/06/21 1336 12/06/21 1345  ?BP:  (!) 102/51  119/63  ?Pulse:  75  66  ?Resp:    18  ?Temp:    (!) 97.5 ?F (36.4 ?C)  ?TempSrc:    Oral  ?SpO2: 93% 98% 99% 100%  ?Weight:      ?Height:      ? ? ?Intake/Output Summary (Last 24 hours) at 12/06/2021 1948 ?Last data filed at 12/06/2021 1840 ?Gross per 24 hour  ?Intake 1320 ml  ?Output 1356 ml  ?Net -36 ml  ? ?Filed Weights  ? 12/05/21 0500 12/05/21 1641 12/06/21 0500  ?Weight: 105.3 kg 105.3 kg 106.7 kg  ? ? ?Examination: ?General exam: Alert, awake, oriented x 3; reporting to have trouble sleeping and having significant pain on his right leg.  Still requiring 2.5 L nasal cannula supplementation and complaining of short winded sensation. ?Respiratory system: Positive rhonchi and fine crackles at the bases; no wheezing, no using accessory muscles. ?Cardiovascular system:RRR. No rubs or gallops, no JVD. ?Gastrointestinal system: Abdomen is nondistended, soft and nontender. No organomegaly or masses felt. Normal bowel sounds heard. ?Central nervous system: Alert and oriented. No focal neurological  deficits. ?Extremities: No cyanosis or clubbing; right AKA, left lower extremity with erythematous changes and blister lesions; patient also with swelling appreciated in his upper extremities bilaterally. ?Skin: No petechiae. ?Psychiatry: Judgement and insight appear normal. Mood & affect appropriate.  ? ? ?Data Reviewed: I have personally reviewed following labs and imaging studies ? ?CBC: ?Recent Labs  ?Lab 12/03/21 ?1312 12/04/21 ?0418 12/05/21 ?1647 12/06/21 ?0356  ?WBC 5.5 5.5 5.2 4.8  ?NEUTROABS 3.8  --   --   --   ?HGB 10.4* 8.7* 9.1* 9.2*  ?HCT 32.8* 27.7* 28.4* 29.1*  ?MCV 103.8* 104.9* 104.0* 104.3*  ?PLT 134* 123* 155 134*  ? ?Basic Metabolic Panel: ?Recent Labs  ?Lab 12/03/21 ?1516 12/04/21 ?6811 12/05/21 ?1647 12/06/21 ?0356  ?NA 141 142 139 139  ?K 4.3 4.2 4.2 3.9  ?CL 102 104 99 99  ?CO2 '29 28 29 29  '$ ?GLUCOSE 179* 147* 142* 151*  ?BUN 42* 47* 40* 26*  ?CREATININE 5.66* 6.36* 5.53* 4.00*  ?CALCIUM 9.1 9.0 8.9 9.1  ? ?GFR: ?Estimated Creatinine Clearance: 22.7 mL/min (A) (by C-G formula based on SCr of 4 mg/dL (H)). ? ?Liver Function Tests: ?Recent Labs  ?Lab 12/03/21 ?1516  ?AST 56*  ?ALT 55*  ?ALKPHOS 155*  ?BILITOT 1.0  ?PROT 6.6  ?ALBUMIN 2.9*  ? ?Coagulation Profile: ?Recent Labs  ?Lab 12/03/21 ?1312  ?INR 1.4*  ? ?CBG: ?Recent Labs  ?Lab 12/05/21 ?1611  12/05/21 ?2137 12/06/21 ?0710 12/06/21 ?1109 12/06/21 ?1641  ?GLUCAP 149* 135* 164* 190* 162*  ? ? ?Sepsis Labs: ?Recent Labs  ?Lab 12/03/21 ?1354 12/03/21 ?1516  ?PROCALCITON  --  2.65  ?LATICACIDVEN 2.0*  --   ? ? ?Recent Results (from the past 240 hour(s))  ?Blood Culture (routine x 2)     Status: None (Preliminary result)  ? Collection Time: 12/03/21  1:21 PM  ? Specimen: BLOOD  ?Result Value Ref Range Status  ? Specimen Description BLOOD BLOOD RIGHT FOREARM  Final  ? Special Requests   Final  ?  BOTTLES DRAWN AEROBIC AND ANAEROBIC Blood Culture adequate volume  ? Culture   Final  ?  NO GROWTH 3 DAYS ?Performed at [facility], [address], [place], [place] [address] ?  ? Report Status PENDING  Incomplete  ?Resp Panel by RT-PCR (Flu A&B, Covid) Nasopharyngeal Swab     Status: None  ? Collection Time: 12/03/21  2:24 PM  ? Specimen: Nasopharyngeal Swab; Nasopharyngeal(

## 2021-12-06 NOTE — Progress Notes (Signed)
Palliative: ? ?HPI: 71 y.o. male  with past medical history of HFrEF EF 30-35%, severe aortic stenosis, ESRD on HD, COPD, HTN, PVD, s/p R AKA, diabetes, history of nonhealing wound, hypothyroidism, PTSD admitted on 12/03/2021 with shortness of breath with signs of volume overload with acute hypoxic respiratory failure. Seen by inpatient palliative team 11/24/21 with documented decision for DNR but no ready for hospice with plans to continue dialysis. ? ?I met today with [name] along with his wife, daughter/HCPOA Beth, and nephew at bedside. [name] has good questions about palliative vs hospice. She wants her father to have quality of life and would want him to have hospice support when indicated. She has good understanding of his complicated medical issues.  ? ?[name] is very frustrated about the changes and how quickly everything has changed. He is frustrated with all the discussion and feels like he he has been given a time limit. I explained that nobody wants him to feel like we have given up on him or aren't going to try and follow his wishes but the medical team also feels helpless and just wants to be honest with him that they do not have a fix to his heart problems and that we are running out of options to help him dialyze as he needs. He understands the limitations of medicine and why these conversations are occurring. He just wants to go home and get back to some sort of routine. We discussed the comforts of having a routine and some level of normalcy in the midst of such changes. He is clear on his wishes to continue dialysis as long as possible (he does recognize the limitations and that this may not be an option for him much longer) as he knows the finality of stopping dialysis. He does share that he does not like being in the hospital and he does NOT want to die in a hospital but at home. We discussed code status in the context of this goal and he agrees with DNR.  ? ?[name] also expresses  frustration that he was unable to get his pain medication and that the schedule was changed (without his knowledge). I explained that I am guessing this was lowered do to his issues with hypotension especially during dialysis. [name] recognizes the side effects of pain medication and wishes for this to return to his previous frequency. I explained that this may be discussed again if he continues to have issues with hypotension and he just wants to be included in these decisions.  ? ?All questions/concerns addressed. Emotional support provided.  ? ?Exam: Awake, alert, oriented. No distress. Breathing slightly labored at rest at times. Abd soft. Moves all extremities. RLE amputation. LLE wrapped.  ? ?Plan: ?- DNR.  ?- Home with home health services and palliative care services.  ?- Wants to continue dialysis as long as possible.  ?- Wants to die at home.  ? ?[address] [name] ? ?Vinie Sill, NP ?Palliative Medicine Team ?Pager [phone] (Please see [email or web address] for schedule) ?Team Phone [phone]  ? ? ?Greater than 50%  of this time was spent counseling and coordinating care related to the above assessment and plan   ?

## 2021-12-07 DIAGNOSIS — Z7189 Other specified counseling: Secondary | ICD-10-CM | POA: Diagnosis not present

## 2021-12-07 DIAGNOSIS — Z515 Encounter for palliative care: Secondary | ICD-10-CM | POA: Diagnosis not present

## 2021-12-07 DIAGNOSIS — I5023 Acute on chronic systolic (congestive) heart failure: Secondary | ICD-10-CM | POA: Diagnosis not present

## 2021-12-07 DIAGNOSIS — N186 End stage renal disease: Secondary | ICD-10-CM | POA: Diagnosis not present

## 2021-12-07 LAB — BASIC METABOLIC PANEL
Anion gap: 8 (ref 5–15)
BUN: 41 mg/dL — ABNORMAL HIGH (ref 8–23)
CO2: 32 mmol/L (ref 22–32)
Calcium: 9.1 mg/dL (ref 8.9–10.3)
Chloride: 101 mmol/L (ref 98–111)
Creatinine, Ser: 5.87 mg/dL — ABNORMAL HIGH (ref 0.61–1.24)
GFR, Estimated: 10 mL/min — ABNORMAL LOW (ref >60)
Glucose, Bld: 156 mg/dL — ABNORMAL HIGH (ref 70–99)
Potassium: 4.5 mmol/L (ref 3.5–5.1)
Sodium: 141 mmol/L (ref 135–145)

## 2021-12-07 LAB — CBC
HCT: 27.6 % — ABNORMAL LOW (ref 39.0–52.0)
Hemoglobin: 8.5 g/dL — ABNORMAL LOW (ref 13.0–17.0)
MCH: 32.4 pg (ref 26.0–34.0)
MCHC: 30.8 g/dL (ref 30.0–36.0)
MCV: 105.3 fL — ABNORMAL HIGH (ref 80.0–100.0)
Platelets: 138 10*3/uL — ABNORMAL LOW (ref 150–400)
RBC: 2.62 MIL/uL — ABNORMAL LOW (ref 4.22–5.81)
RDW: 19.6 % — ABNORMAL HIGH (ref 11.5–15.5)
WBC: 4.7 10*3/uL (ref 4.0–10.5)
nRBC: 0 % (ref 0.0–0.2)

## 2021-12-07 LAB — GLUCOSE, CAPILLARY
Glucose-Capillary: 175 mg/dL — ABNORMAL HIGH (ref 70–99)
Glucose-Capillary: 179 mg/dL — ABNORMAL HIGH (ref 70–99)
Glucose-Capillary: 119 mg/dL — ABNORMAL HIGH (ref 70–99)

## 2021-12-07 MED ORDER — ALBUMIN HUMAN 25 % IV SOLN
INTRAVENOUS | Status: AC
  Administered 2021-12-07: 25 g
  Filled 2021-12-07: qty 100

## 2021-12-07 MED ORDER — MIDODRINE HCL 10 MG PO TABS: 10.0000 mg | ORAL_TABLET | ORAL | 1 refills | Status: AC

## 2021-12-07 MED ORDER — CEFADROXIL 1 G PO TABS: 1.0000 g | ORAL_TABLET | ORAL | 0 refills | Status: AC

## 2021-12-07 MED ORDER — CALCIUM ACETATE (PHOS BINDER) 667 MG PO CAPS: 667.0000 mg | ORAL_CAPSULE | ORAL | Status: AC

## 2021-12-07 MED ORDER — JUVEN PO PACK: 1.0000 | PACK | Freq: Two times a day (BID) | ORAL | 1 refills | Status: AC

## 2021-12-07 NOTE — Progress Notes (Signed)
SATURATION QUALIFICATIONS: (This note is used to comply with regulatory documentation for home oxygen) ? ?Patient Saturations on Room Air at Rest = 87% ? ?Patient Saturations on Room Air while Ambulating = Pt unable to ambulate, has BKA to the R leg. Dyspnea on exertion with expiratory wheezing when sliding or using upper body strength to move.  ? ?Patient Saturations on 2 Liters of oxygen while at rest = 99% ? ?Please briefly explain why patient needs home oxygen: Pt feels more comfort with oxygen on, states he cannot take in a full breath and any activity at all makes his saturations decrease significantly.  ?

## 2021-12-07 NOTE — Progress Notes (Signed)
Palliative: ? ?HPI: 71 y.o. male  with past medical history of HFrEF EF 30-35%, severe aortic stenosis, ESRD on HD, COPD, HTN, PVD, s/p R AKA, diabetes, history of nonhealing wound, hypothyroidism, PTSD admitted on 12/03/2021 with shortness of breath with signs of volume overload with acute hypoxic respiratory failure. Seen by inpatient palliative team 11/24/21 with documented decision for DNR but no ready for hospice with plans to continue dialysis. ? ?I met today with [name] and his wife at bedside. He is sitting on side of bed. He continues to complain of pain in left leg and back but pain medication helps with his pain from amputation. He has no specific questions or concerns regarding our conversations over the past few days. His goals remain unchanged. He shares with me about his struggles growing up and having a difficult childhood. He expresses appreciation to his wife and how they have always had multiple barriers and issues with their families but they get through together. He is anxious to get dialysis over with today and to return back home. Conversations with CSW regarding trying to arrange for transportation home from the hospital and to help get to/from dialysis. Seems there are not good options to help with this. I did encourage [name] to reach out to the [place] since he is a veteran to see how they can assist with transport.  ? ?All questions/concerns addressed. Emotional support provided. Therapeutic listening.  ? ?Exam: Awake, alert, oriented. No distress. Breathing slightly labored/congested at times - even at rest. Abd soft. Moves all extremities. RLE amputation. LLE wrapped.  ?  ?Plan: ?- Goals unchanged.  ?- DNR.  ?- Home with home health services and palliative care services.  ?- Wants to continue dialysis as long as possible.  ?- Wants to die at home.  ? ?35 min ? ?Vinie Sill, NP ?Palliative Medicine Team ?Pager [phone] (Please see [email or web address] for schedule) ?Team Phone [phone]   ? ? ?Greater than 50%  of this time was spent counseling and coordinating care related to the above assessment and plan   ?

## 2021-12-07 NOTE — Discharge Summary (Addendum)
Physician Discharge Summary   Patient: [name] MRN: [id number] DOB: [birth year]  Admit date:     12/03/2021  Discharge date: 12/07/21  Discharge Physician: Barton Dubois   PCP: Caryl Bis, MD   Recommendations at discharge:  Repeat CBC to follow hemoglobin trend Continue assessing further improvement in volume status and oxygen saturation.  Please wean off oxygen supplementation as tolerated. Make sure patient has follow-up as instructed with nephrology for 4 times a week hemodialysis treatments. Continue assisting with low-sodium/modified carbohydrates and renal diet. Goals of care discussion and advance care planning recommended. Reassess blood pressure and further adjust antihypertensive treatment as needed.  Discharge Diagnoses: Principal Problem:   Acute exacerbation of congestive heart failure (HCC) Active Problems:   ESRD (end stage renal disease) (HCC)   Peripheral vascular disease, unspecified (HCC)   Acute respiratory failure with hypoxia (HCC)   Diabetes mellitus with end stage renal disease ([place])   Diabetic peripheral neuropathy (HCC)   Anemia in chronic kidney disease   Essential hypertension   DMII (diabetes mellitus, type 2) (HCC)   Type 2 diabetes mellitus with diabetic neuropathy (HCC)   Cellulitis of left lower extremity   GERD (gastroesophageal reflux disease)   Severe aortic stenosis by prior echocardiogram   Pressure injury of skin   Hospital Course: [name] is a 71 y.o. male with medical history significant of severe systolic congestive heart failure, severe aortic stenosis, ESRD on HD TTS, COPD on room air, CAD, HTN, PVD status post right AKA, chronic left lower extremity wound, and insulin-dependent DM2, GERD, hypothyroidism, who presents today with what appears to be volume overload and acute hypoxic respiratory failure.  In the ED patient presented with BiPAP in place per EMS, able to be weaned to 4 L nasal cannula in the ED with  supportive care.  Nephrology was consulted and hospitalist called for admission.   Of note patient was just discharged from our facility on 11/24/2021 for newly documented severe aortic stenosis, syncope, and newly diagnosed severe systolic congestive heart failure with plans at that time to follow-up with palliative care in the outpatient setting.  Patient has decided to be transition back to DNR status; (12/06/2021)..  Assessment and Plan:   Acute hypoxic respiratory failure multifactorial; Secondary to acute exacerbation of systolic heart failure Volume overload in the setting of ESRD Concern for noncompliance Severe aortic stenosis Rule out COPD exacerbation -Patient unfortunately has multiple comorbid conditions contributing to his acute hypoxic respiratory failure. -Patient's heart failure is somewhat difficult to control given his ESRD status and inability to tolerate diuretics at home given poor urine output.  -patient's home diet apparently has not been as tightly controlled as one would hope for, but continues to eat deli meat and canned food on occasion. -Volume will need to be controlled with hemodialysis therapy; ultrafiltration limited due to hypotensive episodes during treatment. -nephrology recommending 4 day/week dialysis to accommodate poor cardiac function given his current EF and severe aortic stenosis.   -Recommending a Monday Tuesday Thursday Saturday regimen. -Continue supportive care including supplemental oxygen and wean off as tolerated. -Resume home medications including isosorbide, metoprolol. -Continue to follow low-sodium diet and maintain adequate hydration.   Goals of care -Palliative care following, appreciate insight and recommendations -Lengthy discussion today at bedside along with palliative care about patient's prognosis, limited dialysis in the setting of profound heart failure with severe aortic stenosis might limit his ability to tolerate dialysis in the  near future. -Patient has decided to be  transition back to DNR status. -Continue to treat what is treatable with intention to be able to go home while pursuing dialysis as tolerated. -Palliative care to follow as an outpatient.   Lactic acidosis Rule out infectious process (Cellulitis -left leg - presumed at intake), POA -Likely in the setting of volume status, patient does not meet sepsis criteria -Procalcitonin elevated >2; cannot give IVF due to ESRD  -Covering presumed cellulitis with ancef now (narrowing antibiotics). -Questionable chest xray findings secondary to volume status as above given bilateral nature -Continue to maintain adequate oxygenation and further stabilize volume With 4 times a week dialysis.   CAD -continue statin, Plavix beta-blocker.  Patient denies chest pain at time of discharge.. -Continue outpatient follow-up with cardiology service.  Hypertension -continue metoprolol, isosorbide (midodrine on dialysis days).   -Overall blood pressure stable at time of discharge. -Continue to follow ultrafiltration tolerance and hemodialysis treatment.  PVD status post right AKA -continue plavix, statin and beta-blocker  Noninsulin-dependent diabetes mellitus type 2 -continue to follow modified carbohydrate diet and arrange follow-up with PCP to further adjust hypoglycemia regimen as needed.  GERD:  -Continue PPI.  Hypothyroidism  -continue levothyroxine.  Chronic pain syndrome/neuropathy:  -Resume home analgesic regimen. -Continue Lyrica.  Overweight -Body mass index is 28.71 kg/m. -Low calorie diet and portion control discussed with patient.   Left lower extremity cellulitis -Mentioned above we will continue management with antibiotic therapy; at this point treatment has been transitioned to oral route using Duricef for 5 more doses.  Anemia of chronic disease -Overall stable -No overt bleeding -IV iron and Epogen therapy as per renal discretion -No  transfusion required.  Consultants: Nephrology service and palliative care Procedures performed: See below for x-ray reports. Disposition: Home  Diet recommendation: Low-sodium/renal diet and modified carbohydrates.  DISCHARGE MEDICATION: Allergies as of 12/07/2021       Reactions   Ace Inhibitors    Unknown reaction, tolerates lisinopril    Clonidine    Unknown reaction    Morphine And Related Other (See Comments)   Not effective   Oxytocin    unknown        Medication List     STOP taking these medications    bismuth subsalicylate 856 DJ/49FW suspension Commonly known as: PEPTO BISMOL   ondansetron 8 MG tablet Commonly known as: ZOFRAN       TAKE these medications    albuterol 108 (90 Base) MCG/ACT inhaler Commonly known as: VENTOLIN HFA Inhale 2 puffs into the lungs every 6 (six) hours as needed for wheezing or shortness of breath.   ALPRAZolam 0.5 MG tablet Commonly known as: XANAX Take 0.5 mg by mouth 2 (two) times daily as needed for anxiety.   ARTIFICIAL TEAR OP Place 1 drop into both eyes every 6 (six) hours as needed (dry eyes).   atorvastatin 80 MG tablet Commonly known as: LIPITOR Take 1 tablet (80 mg total) by mouth at bedtime. What changed: when to take this   calcitRIOL 0.25 MCG capsule Commonly known as: ROCALTROL Take 1 capsule (0.25 mcg total) by mouth every Tuesday, Thursday, and Saturday at 6 PM.   calcium acetate 667 MG capsule Commonly known as: PHOSLO Take 1-2 capsules (667-1,334 mg total) by mouth See admin instructions. Take 1334 mg with meals and 667 mg with each snack   cefadroxil 1 g tablet Commonly known as: DURICEF Take 1 tablet (1 g total) by mouth every other day for 5 doses.   clopidogrel 75 MG tablet Commonly known  as: PLAVIX Take 1 tablet (75 mg total) by mouth daily.   diphenhydrAMINE 25 MG tablet Commonly known as: BENADRYL Take 25 mg by mouth daily as needed for itching.   Global Ease Inject Pen Needles  32G X 4 MM Misc Generic drug: Insulin Pen Needle TO BE USED DAILY WITH INSULIN   isosorbide mononitrate 30 MG 24 hr tablet Commonly known as: IMDUR Take 0.5 tablets (15 mg total) by mouth daily.   levothyroxine 150 MCG tablet Commonly known as: SYNTHROID Take 150 mcg by mouth daily.   metoprolol tartrate 25 MG tablet Commonly known as: LOPRESSOR Take 0.5 tablets (12.5 mg total) by mouth 2 (two) times daily.   midodrine 10 MG tablet Commonly known as: PROAMATINE Take 1 tablet (10 mg total) by mouth every Monday, Wednesday, and Friday. Start taking on: December 08, 2021 What changed:  how much to take when to take this additional instructions   multivitamin Tabs tablet Take 1 tablet by mouth daily.   nutrition supplement (JUVEN) Pack Take 1 packet by mouth 2 (two) times daily between meals.   Oxycodone HCl 10 MG Tabs Take 10 mg by mouth 3 (three) times daily.   pantoprazole 40 MG tablet Commonly known as: PROTONIX Take 40 mg by mouth daily as needed (acid reflux).   pregabalin 75 MG capsule Commonly known as: LYRICA Take 2 capsules (150 mg total) by mouth daily. What changed: when to take this   senna 8.6 MG tablet Commonly known as: SENOKOT Take 1 tablet by mouth 2 (two) times daily.   Toujeo SoloStar 300 UNIT/ML Solostar Pen Generic drug: insulin glargine (1 Unit Dial) Inject 10-15 Units into the skin at bedtime as needed (if blood sugar is 130 or higher).               Durable Medical Equipment  (From admission, onward)           Start     Ordered   12/07/21 1317  For home use only DME oxygen  Once       Question Answer Comment  Length of Need 12 Months   Mode or (Route) Nasal cannula   Liters per Minute 2   Frequency Continuous (stationary and portable oxygen unit needed)   Oxygen conserving device Yes   Oxygen delivery system Gas      12/07/21 1316              Discharge Care Instructions  (From admission, onward)            Start     Ordered   12/07/21 0000  Discharge wound care:       Comments: Keep area clean and dry; for your lower extremity avoid scratching and cleaning skin with soap and water, rinse and pat dry.  Cover with Xeroform gauze, topped with dry gauze and secure with Kerlix roll gauze/paper tape.  Change on daily basis.   12/07/21 1251            Follow-up Information     Caryl Bis, MD. Schedule an appointment as soon as possible for a visit in 2 week(s).   Specialty: Family Medicine Contact information: [place] [id number] [phone]         Satira Sark, MD .   Specialty: Cardiology Contact information: [place] [address] [phone]                Discharge Exam: Danley Danker  Weights   12/05/21 1641 12/06/21 0500 12/07/21 0601  Weight: 105.3 kg 106.7 kg 107 kg   General exam: Alert, awake, oriented x 3; no chest pain, no nausea, no vomiting.  Reports after adjusting pain medication the pain in his right leg has improved.  Still requiring 2 L nasal cannula supplementation to maintain saturation above 90%.  Been feeling ready to go home. Respiratory system: Decreased breath sounds at the bases, no wheezing, no using accessory muscle. Cardiovascular system:RRR. No rubs or gallops; positive systolic murmur, no JVD on exam. Gastrointestinal system: Abdomen is obese, nondistended, soft and nontender. No organomegaly or masses felt. Normal bowel sounds heard. Central nervous system: Alert and oriented. No focal neurological deficits. Extremities: No cyanosis or clubbing; right AKA.  Left lower extremity with improvement in erythematous/cellulitic changes and swelling.  No active drainage. Skin: No petechiae. Psychiatry: Judgement and insight appear normal. Mood & affect appropriate.    Condition at discharge: Several improved.  The results of significant diagnostics from this hospitalization (including imaging, microbiology,  ancillary and laboratory) are listed below for reference.   Imaging Studies: CT Head Wo Contrast  Result Date: 11/19/2021 CLINICAL DATA:  Syncopal episode.  Patient fell over onto his head. EXAM: CT HEAD WITHOUT CONTRAST CT CERVICAL SPINE WITHOUT CONTRAST TECHNIQUE: Multidetector CT imaging of the head and cervical spine was performed following the standard protocol without intravenous contrast. Multiplanar CT image reconstructions of the cervical spine were also generated. RADIATION DOSE REDUCTION: This exam was performed according to the departmental dose-optimization program which includes automated exposure control, adjustment of the mA and/or kV according to patient size and/or use of iterative reconstruction technique. COMPARISON:  12/02/2015. FINDINGS: CT HEAD FINDINGS Brain: No evidence of acute infarction, hemorrhage, hydrocephalus, extra-axial collection or mass lesion/mass effect. Vascular: No hyperdense vessel or unexpected calcification. Skull: Normal. Negative for fracture or focal lesion. Sinuses/Orbits: Globes and orbits are unremarkable. Sinuses are clear. Other: None. CT CERVICAL SPINE FINDINGS Alignment: Normal. Skull base and vertebrae: No acute fracture. No primary bone lesion or focal pathologic process. Soft tissues and spinal canal: No prevertebral fluid or swelling. No visible canal hematoma. Disc levels: Mild disc bulging at C3-C4 and C6-C7. No evidence of a disc herniation. No significant stenosis. Upper chest: Bilateral pleural effusions. Mild interstitial thickening in the lung apices. Other: None. IMPRESSION: HEAD CT 1. No acute intracranial abnormalities. CERVICAL CT 1. No fracture or acute finding. 2. Bilateral pleural effusions. Electronically Signed   By: Lajean Manes M.D.   On: 11/19/2021 16:05   CT Cervical Spine Wo Contrast  Result Date: 11/19/2021 CLINICAL DATA:  Syncopal episode.  Patient fell over onto his head. EXAM: CT HEAD WITHOUT CONTRAST CT CERVICAL SPINE WITHOUT  CONTRAST TECHNIQUE: Multidetector CT imaging of the head and cervical spine was performed following the standard protocol without intravenous contrast. Multiplanar CT image reconstructions of the cervical spine were also generated. RADIATION DOSE REDUCTION: This exam was performed according to the departmental dose-optimization program which includes automated exposure control, adjustment of the mA and/or kV according to patient size and/or use of iterative reconstruction technique. COMPARISON:  12/02/2015. FINDINGS: CT HEAD FINDINGS Brain: No evidence of acute infarction, hemorrhage, hydrocephalus, extra-axial collection or mass lesion/mass effect. Vascular: No hyperdense vessel or unexpected calcification. Skull: Normal. Negative for fracture or focal lesion. Sinuses/Orbits: Globes and orbits are unremarkable. Sinuses are clear. Other: None. CT CERVICAL SPINE FINDINGS Alignment: Normal. Skull base and vertebrae: No acute fracture. No primary bone lesion or focal pathologic process. Soft tissues  and spinal canal: No prevertebral fluid or swelling. No visible canal hematoma. Disc levels: Mild disc bulging at C3-C4 and C6-C7. No evidence of a disc herniation. No significant stenosis. Upper chest: Bilateral pleural effusions. Mild interstitial thickening in the lung apices. Other: None. IMPRESSION: HEAD CT 1. No acute intracranial abnormalities. CERVICAL CT 1. No fracture or acute finding. 2. Bilateral pleural effusions. Electronically Signed   By: Lajean Manes M.D.   On: 11/19/2021 16:05   CARDIAC CATHETERIZATION  Result Date: 11/21/2021   Prox LAD lesion is 50% stenosed.   Mid LAD lesion is 90% stenosed.   1st Diag lesion is 100% stenosed.   Prox RCA lesion is 100% stenosed.   Hemodynamic findings consistent with moderate pulmonary hypertension. Severe 2 vessel obstructive CAD. The LAD has a 90% stenosis at the first septal perforator. The first diagonal appears to be occluded with collaterals. The RCA is  occluded proximally with left to right collaterals. All vessels are severely calcified. Moderately elevated LV filling pressures. PCWP mean 20 mm Hg High RV filling pressures with RA pressure of 22 mm Hg Moderate to severe pulmonary HTN with mean PAP 44 mm Hg Cardiac index of 2.5 is probably low for someone with AV fistula. Severe PAD with difficult arterial access Plan: will need heart team approach. Patient has significant CAD, aortic stenosis, decompensated CHF. Multiple co morbidities and difficult access.   US Carotid Bilateral  Result Date: 11/20/2021 CLINICAL DATA:  71 year old male with history of syncope. History of left carotid endarterectomy. EXAM: BILATERAL CAROTID DUPLEX ULTRASOUND TECHNIQUE: Pearline Cables scale imaging, color Doppler and duplex ultrasound were performed of bilateral carotid and vertebral arteries in the neck. COMPARISON:  None. FINDINGS: Criteria: Quantification of carotid stenosis is based on velocity parameters that correlate the residual internal carotid diameter with NASCET-based stenosis levels, using the diameter of the distal internal carotid lumen as the denominator for stenosis measurement. The following velocity measurements were obtained: RIGHT ICA: Peak systolic velocity 64 cm/sec, End diastolic velocity 17 cm/sec CCA: Peak systolic velocity 59 cm/sec SYSTOLIC ICA/CCA RATIO:  1.1 ECA: Peak systolic velocity 56 cm/sec LEFT ICA: Peak systolic velocity 95 cm/sec, End diastolic velocity 24 cm/sec CCA: 60 cm/sec SYSTOLIC ICA/CCA RATIO:  1.6 ECA: 292 cm/sec RIGHT CAROTID ARTERY: Mild focal atherosclerotic plaque formation about the proximal ICA. No significant tortuosity. Normal low resistance waveforms. RIGHT VERTEBRAL ARTERY:  Antegrade flow. LEFT CAROTID ARTERY: No significant atherosclerotic plaque formation. No significant tortuosity. Normal low resistance waveforms. LEFT VERTEBRAL ARTERY:  Antegrade flow. Upper extremity non-invasive blood pressures: Not obtained. IMPRESSION: 1.  Right carotid artery system: Less than 50% stenosis secondary to mild atherosclerotic plaque formation in the proximal internal carotid artery. 2. Left carotid artery system: Patent without significant atherosclerotic plaque formation. 3.  Vertebral artery system: Patent with antegrade flow bilaterally. Ruthann Cancer, MD [facility] Radiology Electronically Signed   By: Ruthann Cancer M.D.   On: 11/20/2021 15:32   DG Chest Port 1 View  Result Date: 12/03/2021 CLINICAL DATA:  Cough and shortness of breath today. EXAM: PORTABLE CHEST 1 VIEW COMPARISON:  November 19, 2021 FINDINGS: Tortuosity and calcific atherosclerotic disease of the aorta. Enlarged cardiac silhouette. Streaky interstitial opacities throughout both lungs, most pronounced in the left lower lung field. Osseous structures are without acute abnormality. Soft tissues are grossly normal. IMPRESSION: 1. Streaky interstitial opacities throughout both lungs, most pronounced in the left lower lung field. This may represent interstitial pulmonary edema or atypical pneumonia. 2. Enlarged cardiac  silhouette. Electronically Signed   By: Fidela Salisbury M.D.   On: 12/03/2021 14:33   DG Chest Port 1 View  Result Date: 11/19/2021 CLINICAL DATA:  Questionable sepsis - evaluate for abnormality EXAM: PORTABLE CHEST 1 VIEW COMPARISON:  Chest radiograph dated December 01, 2018 FINDINGS: Incomplete assessment of the lateral soft tissues. The cardiomediastinal silhouette is unchanged and enlarged in contour.Atherosclerotic calcifications. Asymmetric LEFT apical pleural thickening, increased in conspicuity in comparison to prior and possibly secondary to patient positioning. Possible small LEFT pleural effusion versus pericardial fat. No pneumothorax. LEFT retrocardiac opacity, likely atelectasis. Perihilar vascular congestion without overt edema. Visualized abdomen is unremarkable. IMPRESSION: 1. Increased conspicuity of  asymmetric LEFT apical pleural thickening. This could be due to patient positioning. Consider further evaluation with dedicated cross-sectional imaging versus PA and lateral chest radiograph. 2. LEFT retrocardiac opacity, likely atelectasis. Differential considerations include infection. 3. Possible small LEFT pleural effusion. Electronically Signed   By: Valentino Saxon M.D.   On: 11/19/2021 13:54   ECHOCARDIOGRAM COMPLETE  Result Date: 11/20/2021    ECHOCARDIOGRAM REPORT   Patient Name:   RIDDICK NUON [facility] Date of Exam: 11/20/2021 Medical Rec #:  [id number]       Height:       76.0 in Accession #:    [id number]      Weight:       286.6 lb Date of Birth:  Nov 09, 1950       BSA:          2.581 m Patient Age:    71 years        BP:           92/68 mmHg Patient Gender: M               HR:           68 bpm. Exam Location:  [facility] Procedure: 2D Echo, Cardiac Doppler and Color Doppler Indications:    Syncope  History:        Patient has prior history of Echocardiogram examinations, most                 recent 02/03/2020. CAD, PAD and COPD, Signs/Symptoms:Edema and                 Syncope; Risk Factors:Hypertension, Diabetes, Dyslipidemia and                 Current Smoker. AAA, BKA. Some images by Lonn Georgia, student.  Sonographer:    Wenda Low Referring Phys: [id number] ASIA B [place]  1. Left ventricular ejection fraction, by estimation, is 30 to 35%. The left ventricle has moderately decreased function. The left ventricle demonstrates global hypokinesis. The left ventricular internal cavity size was mildly dilated. There is moderate  left ventricular hypertrophy. Left ventricular diastolic parameters are indeterminate.  2. Right ventricular systolic function is moderately reduced. The right ventricular size is mildly enlarged. There is mildly elevated pulmonary artery systolic pressure.  3. Left atrial size was moderately dilated.  4. The mitral valve is degenerative. Mild mitral valve  regurgitation. No evidence of mitral stenosis. Moderate mitral annular calcification.  5. Severe low flow AS with moderately decreased EF. AS/decreased EF new since 2021 . The aortic valve was not well visualized. There is severe calcifcation of the aortic valve. There is severe thickening of the aortic valve. Aortic valve regurgitation is trivial. Severe aortic valve stenosis.  6. The inferior vena cava is dilated in size with <50% respiratory variability, suggesting right  atrial pressure of 15 mmHg. FINDINGS  Left Ventricle: Left ventricular ejection fraction, by estimation, is 30 to 35%. The left ventricle has moderately decreased function. The left ventricle demonstrates global hypokinesis. The left ventricular internal cavity size was mildly dilated. There is moderate left ventricular hypertrophy. Left ventricular diastolic parameters are indeterminate. Right Ventricle: The right ventricular size is mildly enlarged. No increase in right ventricular wall thickness. Right ventricular systolic function is moderately reduced. There is mildly elevated pulmonary artery systolic pressure. The tricuspid regurgitant velocity is 2.54 m/s, and with an assumed right atrial pressure of 15 mmHg, the estimated right ventricular systolic pressure is 42.3 mmHg. Left Atrium: Left atrial size was moderately dilated. Right Atrium: Right atrial size was normal in size. Pericardium: There is no evidence of pericardial effusion. Mitral Valve: The mitral valve is degenerative in appearance. There is moderate thickening of the mitral valve leaflet(s). There is moderate calcification of the mitral valve leaflet(s). Moderate mitral annular calcification. Mild mitral valve regurgitation. No evidence of mitral valve stenosis. MV peak gradient, 6.1 mmHg. The mean mitral valve gradient is 2.0 mmHg. Tricuspid Valve: The tricuspid valve is normal in structure. Tricuspid valve regurgitation is mild . No evidence of tricuspid stenosis. Aortic  Valve: Severe low flow AS with moderately decreased EF. AS/decreased EF new since 2021. The aortic valve was not well visualized. There is severe calcifcation of the aortic valve. There is severe thickening of the aortic valve. Aortic valve regurgitation is trivial. Severe aortic stenosis is present. Aortic valve mean gradient measures 23.0 mmHg. Aortic valve peak gradient measures 44.4 mmHg. Aortic valve area, by VTI measures 0.75 cm. Pulmonic Valve: The pulmonic valve was normal in structure. Pulmonic valve regurgitation is not visualized. No evidence of pulmonic stenosis. Aorta: The aortic root is normal in size and structure. Venous: The inferior vena cava is dilated in size with less than 50% respiratory variability, suggesting right atrial pressure of 15 mmHg. IAS/Shunts: No atrial level shunt detected by color flow Doppler.  LEFT VENTRICLE PLAX 2D LVIDd:         6.00 cm      Diastology LVIDs:         4.00 cm      LV e' medial:    4.03 cm/s LV PW:         1.50 cm      LV E/e' medial:  21.0 LV IVS:        1.40 cm      LV e' lateral:   5.55 cm/s LVOT diam:     2.00 cm      LV E/e' lateral: 15.3 LV SV:         56 LV SV Index:   22 LVOT Area:     3.14 cm  LV Volumes (MOD) LV vol d, MOD A2C: 241.0 ml LV vol d, MOD A4C: 156.0 ml LV vol s, MOD A2C: 124.0 ml LV vol s, MOD A4C: 92.6 ml LV SV MOD A2C:     117.0 ml LV SV MOD A4C:     156.0 ml LV SV MOD BP:      91.8 ml RIGHT VENTRICLE RV Basal diam:  5.10 cm RV Mid diam:    4.30 cm RV S prime:     5.55 cm/s TAPSE (M-mode): 1.2 cm LEFT ATRIUM              Index        RIGHT ATRIUM  Index LA diam:        4.40 cm  1.70 cm/m   RA Area:     23.70 cm LA Vol (A2C):   120.0 ml 46.49 ml/m  RA Volume:   77.00 ml  29.83 ml/m LA Vol (A4C):   122.0 ml 47.26 ml/m LA Biplane Vol: 122.0 ml 47.26 ml/m  AORTIC VALVE                     PULMONIC VALVE AV Area (Vmax):    0.80 cm      PV Vmax:       0.40 m/s AV Area (Vmean):   0.73 cm      PV Peak grad:  0.6 mmHg AV Area  (VTI):     0.75 cm AV Vmax:           333.00 cm/s AV Vmean:          217.000 cm/s AV VTI:            0.746 m AV Peak Grad:      44.4 mmHg AV Mean Grad:      23.0 mmHg LVOT Vmax:         84.85 cm/s LVOT Vmean:        50.400 cm/s LVOT VTI:          0.179 m LVOT/AV VTI ratio: 0.24  AORTA Ao Root diam: 3.00 cm Ao Asc diam:  3.70 cm MITRAL VALVE               TRICUSPID VALVE MV Area (PHT): 3.77 cm    TR Peak grad:   25.8 mmHg MV Area VTI:   1.65 cm    TR Vmax:        254.00 cm/s MV Peak grad:  6.1 mmHg MV Mean grad:  2.0 mmHg    SHUNTS MV Vmax:       1.23 m/s    Systemic VTI:  0.18 m MV Vmean:      64.8 cm/s   Systemic Diam: 2.00 cm MV Decel Time: 201 msec MV E velocity: 84.80 cm/s MV A velocity: 62.60 cm/s MV E/A ratio:  1.35 Jenkins Rouge MD Electronically signed by Jenkins Rouge MD Signature Date/Time: 11/20/2021/4:16:04 PM    Final     Microbiology: Results for orders placed or performed during the hospital encounter of 12/03/21  Blood Culture (routine x 2)     Status: None (Preliminary result)   Collection Time: 12/03/21  1:21 PM   Specimen: BLOOD  Result Value Ref Range Status   Specimen Description BLOOD BLOOD RIGHT FOREARM  Final   Special Requests   Final    BOTTLES DRAWN AEROBIC AND ANAEROBIC Blood Culture adequate volume   Culture   Final    NO GROWTH 3 DAYS Performed at [facility], [address] [place], [place] [address]    Report Status PENDING  Incomplete  Resp Panel by RT-PCR (Flu A&B, Covid) Nasopharyngeal Swab     Status: None   Collection Time: 12/03/21  2:24 PM   Specimen: Nasopharyngeal Swab; Nasopharyngeal(NP) swabs in vial transport medium  Result Value Ref Range Status   SARS Coronavirus 2 by RT PCR NEGATIVE NEGATIVE Final    Comment: (NOTE) SARS-CoV-2 target nucleic acids are NOT DETECTED.  The SARS-CoV-2 RNA is generally detectable in upper respiratory specimens during the acute phase of infection. The lowest concentration of SARS-CoV-2 viral copies this assay can  detect is 138 copies/mL. A negative result  does not preclude SARS-Cov-2 infection and should not be used as the sole basis for treatment or other patient management decisions. A negative result may occur with  improper specimen collection/handling, submission of specimen other than nasopharyngeal swab, presence of viral mutation(s) within the areas targeted by this assay, and inadequate number of viral copies(<138 copies/mL). A negative result must be combined with clinical observations, patient history, and epidemiological information. The expected result is Negative.  Fact Sheet for Patients:  EntrepreneurPulse.com.au  Fact Sheet for Healthcare Providers:  IncredibleEmployment.be  This test is no t yet approved or cleared by the [place] FDA and  has been authorized for detection and/or diagnosis of SARS-CoV-2 by FDA under an Emergency Use Authorization (EUA). This EUA will remain  in effect (meaning this test can be used) for the duration of the COVID-19 declaration under Section 564(b)(1) of the Act, 21 U.S.C.section 360bbb-3(b)(1), unless the authorization is terminated  or revoked sooner.       Influenza A by PCR NEGATIVE NEGATIVE Final   Influenza B by PCR NEGATIVE NEGATIVE Final    Comment: (NOTE) The Xpert Xpress SARS-CoV-2/FLU/RSV plus assay is intended as an aid in the diagnosis of influenza from Nasopharyngeal swab specimens and should not be used as a sole basis for treatment. Nasal washings and aspirates are unacceptable for Xpert Xpress SARS-CoV-2/FLU/RSV testing.  Fact Sheet for Patients: EntrepreneurPulse.com.au  Fact Sheet for Healthcare Providers: IncredibleEmployment.be  This test is not yet approved or cleared by the [place] FDA and has been authorized for detection and/or diagnosis of SARS-CoV-2 by FDA under an Emergency Use Authorization (EUA). This EUA will remain in  effect (meaning this test can be used) for the duration of the COVID-19 declaration under Section 564(b)(1) of the Act, 21 U.S.C. section 360bbb-3(b)(1), unless the authorization is terminated or revoked.  Performed at [facility], [address], [place], [place] [address]     Labs: CBC: Recent Labs  Lab 12/03/21 1312 12/04/21 0418 12/05/21 1647 12/06/21 0356 12/07/21 0347  WBC 5.5 5.5 5.2 4.8 4.7  NEUTROABS 3.8  --   --   --   --   HGB 10.4* 8.7* 9.1* 9.2* 8.5*  HCT 32.8* 27.7* 28.4* 29.1* 27.6*  MCV 103.8* 104.9* 104.0* 104.3* 105.3*  PLT 134* 123* 155 134* 295*   Basic Metabolic Panel: Recent Labs  Lab 12/03/21 1516 12/04/21 0418 12/05/21 1647 12/06/21 0356 12/07/21 0347  NA 141 142 139 139 141  K 4.3 4.2 4.2 3.9 4.5  CL 102 104 99 99 101  CO2 '29 28 29 29 '$ 32  GLUCOSE 179* 147* 142* 151* 156*  BUN 42* 47* 40* 26* 41*  CREATININE 5.66* 6.36* 5.53* 4.00* 5.87*  CALCIUM 9.1 9.0 8.9 9.1 9.1   Liver Function Tests: Recent Labs  Lab 12/03/21 1516  AST 56*  ALT 55*  ALKPHOS 155*  BILITOT 1.0  PROT 6.6  ALBUMIN 2.9*   CBG: Recent Labs  Lab 12/06/21 1109 12/06/21 1641 12/06/21 2131 12/07/21 0725 12/07/21 1111  GLUCAP 190* 162* 207* 175* 179*    Discharge time spent: greater than 30 minutes.  Signed: Barton Dubois, MD [facility] 12/07/2021

## 2021-12-07 NOTE — Progress Notes (Signed)
Patients Iv's  and telemetry removed. Assisted with patient getting in to daughters car. Discharge summary given to patient.  ?

## 2021-12-07 NOTE — Procedures (Signed)
Net UF 1357m. Tx completed with intermittent bradycardia and hypotension, albumin 25g administered with some improvement.  Overall tolerated tx well. Blood rinsed and pt is without concerns at this time. NAD VSS. ?

## 2021-12-07 NOTE — TOC Transition Note (Signed)
Transition of Care (TOC) - CM/SW Discharge Note ? ? ?Patient Details  ?Name: [name] ?MRN: [id number] ?Date of Birth: 22-Mar-1951 ? ?Transition of Care (TOC) CM/SW Contact:  ?Jalaysia Lobb D, LCSW ?Phone Number: ?12/07/2021, 11:18 AM ? ? ?Clinical Narrative:    ?Spoke with Joy at [facility] regarding need for HD 4x/week. Facility will add Monday to patient's HD schedule. Advised that he will receive HD today at hospital and present at the facility on Sat. Discussed patient's transportation plan. Patient indicates that he has transportation and that they now have a hoyer lift to assist him getting in to the car. He declined to have transportation to assist with appointments.  ?Patient active with [facility]. Attending advised to put in RN, PT orders at d/c. ?TOC Signing off.  ? ?Final next level of care: [place] ?Barriers to Discharge: No Barriers Identified ? ? ?Patient Goals and CMS Choice ?Patient states their goals for this hospitalization and ongoing recovery are:: home with [facility] ?CMS Medicare.gov Compare Post Acute Care list provided to:: Patient ?Choice offered to / list presented to : Patient ? ?Discharge Placement ?  ?           ?  ?  ?  ?  ? ?Discharge Plan and Services ?In-house Referral: Clinical Social Work ?Discharge Planning Services: CM Consult ?Post Acute Care Choice: Home Health          ?  ?  ?  ?  ?  ?  ?  ?  ?  ?  ? ?Social Determinants of Health (SDOH) Interventions ?  ? ? ?Readmission Risk Interventions ? ?  12/04/2021  ? 10:48 AM 04/07/2020  ? 12:15 PM 05/01/2019  ?  2:07 PM  ?Readmission Risk Prevention Plan  ?Post Dischage Appt  Complete   ?Medication Screening  Complete   ?Transportation Screening Complete Complete Complete  ?[place] or Home Care Consult Complete    ?Social Work Consult for [place] Planning/Counseling Complete    ?Palliative Care Screening Not Applicable    ?Medication Review Press photographer) Complete  Complete  ?PCP or Specialist appointment within  3-5 days of discharge   Complete  ?[place] or Home Care Consult   Complete  ?SW Recovery Care/Counseling Consult   Complete  ?Palliative Care Screening   Not Applicable  ?[place]   Not Applicable  ? ? ? ? ? ?

## 2021-12-07 NOTE — Progress Notes (Signed)
Patient ID: [name], male   DOB: 1950/08/29, 71 y.o.   MRN: [id number] ?S: no acute events overnight. No new complaints this am, he is eager to go home soon. Denies any chest pain, worsening SOB. He reports that his swelling is not worsening. Now DNR ?O:BP 138/79 (BP Location: Right Arm)   Pulse 67   Temp 98.4 ?F (36.9 ?C)   Resp 20   Ht '6\' 4"'$  (1.93 m)   Wt 107 kg   SpO2 92%   BMI 28.71 kg/m?  ? ?Intake/Output Summary (Last 24 hours) at 12/07/2021 0817 ?Last data filed at 12/07/2021 0300 ?Gross per 24 hour  ?Intake 890 ml  ?Output --  ?Net 890 ml  ? ?Intake/Output: ?I/O last 3 completed shifts: ?In: 1370 [P.O.:1320; IV Piggyback:50] ?Out: 1356 [Other:1356] ? Intake/Output this shift: ? No intake/output data recorded. ?Weight change: 1.7 kg ?Gen:NAD ?CVS: K9T2. +systolic murmur ?Resp: bibasilar fine rales, sitting up in bed, no inc'd WOB ?Abd:+BS, soft, NT/ND ?Ext: s/p RAKA, edema of LLE with bandages in place, LUE AVF +T/B ? ?Recent Labs  ?Lab 12/03/21 ?1516 12/04/21 ?6712 12/05/21 ?1647 12/06/21 ?4580 12/07/21 ?0347  ?NA 141 142 139 139 141  ?K 4.3 4.2 4.2 3.9 4.5  ?CL 102 104 99 99 101  ?CO2 '29 28 29 29 '$ 32  ?GLUCOSE 179* 147* 142* 151* 156*  ?BUN 42* 47* 40* 26* 41*  ?CREATININE 5.66* 6.36* 5.53* 4.00* 5.87*  ?ALBUMIN 2.9*  --   --   --   --   ?CALCIUM 9.1 9.0 8.9 9.1 9.1  ?AST 56*  --   --   --   --   ?ALT 55*  --   --   --   --   ? ?Liver Function Tests: ?Recent Labs  ?Lab 12/03/21 ?1516  ?AST 56*  ?ALT 55*  ?ALKPHOS 155*  ?BILITOT 1.0  ?PROT 6.6  ?ALBUMIN 2.9*  ? ?No results for input(s): LIPASE, AMYLASE in the last 168 hours. ?No results for input(s): AMMONIA in the last 168 hours. ?CBC: ?Recent Labs  ?Lab 12/03/21 ?1312 12/04/21 ?0418 12/05/21 ?1647 12/06/21 ?9983 12/07/21 ?3825  ?WBC 5.5 5.5 5.2 4.8 4.7  ?NEUTROABS 3.8  --   --   --   --   ?HGB 10.4* 8.7* 9.1* 9.2* 8.5*  ?HCT 32.8* 27.7* 28.4* 29.1* 27.6*  ?MCV 103.8* 104.9* 104.0* 104.3* 105.3*  ?PLT 134* 123* 155 134* 138*  ? ?Cardiac  Enzymes: ?No results for input(s): CKTOTAL, CKMB, CKMBINDEX, TROPONINI in the last 168 hours. ?CBG: ?Recent Labs  ?Lab 12/06/21 ?0710 12/06/21 ?1109 12/06/21 ?1641 12/06/21 ?2131 12/07/21 ?0725  ?GLUCAP 164* 190* 162* 207* 175*  ? ? ?Iron Studies: No results for input(s): IRON, TIBC, TRANSFERRIN, FERRITIN in the last 72 hours. ?Studies/Results: ?No results found. ? ? atorvastatin  80 mg Oral QHS  ? calcitRIOL  0.25 mcg Oral Q T,Th,Sat-1800  ? calcium acetate  1,334 mg Oral TID WC  ? Chlorhexidine Gluconate Cloth  6 each Topical Q0600  ? clopidogrel  75 mg Oral Daily  ? heparin  5,000 Units Subcutaneous Q8H  ? insulin aspart  0-5 Units Subcutaneous QHS  ? insulin aspart  0-6 Units Subcutaneous TID WC  ? ipratropium-albuterol  3 mL Nebulization Q6H WA  ? isosorbide mononitrate  15 mg Oral Daily  ? levothyroxine  150 mcg Oral Q0600  ? metoprolol tartrate  12.5 mg Oral BID  ? midodrine  10 mg Oral Q M,W,F  ? multivitamin  1 tablet  Oral Daily  ? nutrition supplement (JUVEN)  1 packet Oral BID BM  ? polyethylene glycol  17 g Oral Daily  ? pregabalin  150 mg Oral BID  ? senna  1 tablet Oral BID  ? ? ?BMET ?   ?Component Value Date/Time  ? NA 141 12/07/2021 0347  ? NA 134 02/08/2017 1403  ? K 4.5 12/07/2021 0347  ? CL 101 12/07/2021 0347  ? CO2 32 12/07/2021 0347  ? GLUCOSE 156 (H) 12/07/2021 0347  ? BUN 41 (H) 12/07/2021 0347  ? BUN 67 (H) 02/08/2017 1403  ? CREATININE 5.87 (H) 12/07/2021 0347  ? CALCIUM 9.1 12/07/2021 0347  ? GFRNONAA 10 (L) 12/07/2021 0347  ? GFRAA 6 (L) 04/07/2020 0248  ? ?CBC ?   ?Component Value Date/Time  ? WBC 4.7 12/07/2021 0347  ? RBC 2.62 (L) 12/07/2021 0347  ? HGB 8.5 (L) 12/07/2021 0347  ? HGB 11.5 (L) 02/08/2017 1409  ? HCT 27.6 (L) 12/07/2021 0347  ? HCT 34.3 (L) 02/08/2017 1409  ? PLT 138 (L) 12/07/2021 0347  ? PLT 208 02/08/2017 1409  ? MCV 105.3 (H) 12/07/2021 0347  ? MCV 94 02/08/2017 1409  ? MCH 32.4 12/07/2021 0347  ? MCHC 30.8 12/07/2021 0347  ? RDW 19.6 (H) 12/07/2021 0347  ? RDW 16.7  (H) 02/08/2017 1409  ? LYMPHSABS 0.8 12/03/2021 1312  ? LYMPHSABS 2.1 02/08/2017 1409  ? MONOABS 0.7 12/03/2021 1312  ? EOSABS 0.1 12/03/2021 1312  ? EOSABS 0.2 02/08/2017 1409  ? BASOSABS 0.0 12/03/2021 1312  ? BASOSABS 0.0 02/08/2017 1409  ? ? ?Outpatient dialysis unit: [facility] ?Outpatient dialysis schedule: TTS ?Script: 2K, 2.5 Ca, EDW 106, DFR 500, 4.5hrs, NIPRO, LUE AVF, heparin 3500 units loading then 2000 mid treatment, sensipar '30mg'$  TTS, calcitriol 0.29mg TTS ?  ?Assessment/Plan: ? Acute hypoxic respiratory failure - presumably due to volume overload related to severe aortic stenosis and hypotension with HD.  UF'ing as tolerated, HD 4 x week ? Acute systolic CHF exacerbation - as above.  Poor prognosis as he is not a surgical candidate for his AS or CAD. ? Severe aortic stenosis - not an operative candidate ? ESRD -  Normally TTS.  He still had issues with hypotension and bradycardia which impaired UF.  Will likely require 4 days of HD per week to accommodate his poor cardiac function.  MonTuesThurSat as an outpatient as he does not want to stop HD. Next HD today. ? Hypertension/volume  - as above, UF as tolerated ? Anemia  - transfuse if drops below 8. Iron panel ordered ? Metabolic bone disease -  continue with home meds ? Nutrition - renal diet, carb modified. ? Disposition - poor overall prognosis given multiple end-stage disease processes and co-morbidities.  If he cannot tolerate IHD, would recommend transition to comfort care/hospice. He does not want to transition to hospice at this time.  He wants to try to keep going with HD. Will need 4 days/week given inability to UF. Will need SW assistance to obtain a HD chair for him on Mondays. ?  ? ?VGean Quint MD ?[facility] ? ? ?

## 2021-12-07 NOTE — Care Management Important Message (Signed)
Important Message ? ?Patient Details  ?Name: [name] ?MRN: [id number] ?Date of Birth: 1950-12-14 ? ? ?Medicare Important Message Given:  Yes ? ? ? ? ?Tommy Medal ?12/07/2021, 11:46 AM ?

## 2021-12-07 NOTE — TOC Progression Note (Signed)
Transition of Care (TOC) - Progression Note  ? ? ?Patient Details  ?Name: [name] ?MRN: [id number] ?Date of Birth: April 04, 1951 ? ?Transition of Care (TOC) CM/SW Contact  ?Jonnelle Lawniczak D, LCSW ?Phone Number: ?12/07/2021, 2:06 PM ? ?Clinical Narrative:    ?Patient is now interested in transportation to HD. Contacted his BCBS supplement for transportation benefits. Advised that there are no benefits for HD services. Joy with HD indicated that she will turn patient's name in to  [place] list to identify if patient can be added to the grant funded transportation list. This is a process that can take a while. Information provided to patient.  ? ? ?Expected Discharge Plan: [place] ?Barriers to Discharge: No Barriers Identified ? ?Expected Discharge Plan and Services ?Expected Discharge Plan: [place] ?In-house Referral: Clinical Social Work ?Discharge Planning Services: CM Consult ?Post Acute Care Choice: Home Health ?Living arrangements for the past 2 months: [place] ?Expected Discharge Date: 12/07/21               ?  ?  ?  ?  ?  ?  ?  ?  ?  ?  ? ? ?Social Determinants of Health (SDOH) Interventions ?  ? ?Readmission Risk Interventions ? ?  12/04/2021  ? 10:48 AM 04/07/2020  ? 12:15 PM 05/01/2019  ?  2:07 PM  ?Readmission Risk Prevention Plan  ?Post Dischage Appt  Complete   ?Medication Screening  Complete   ?Transportation Screening Complete Complete Complete  ?[place] or Home Care Consult Complete    ?Social Work Consult for [place] Planning/Counseling Complete    ?Palliative Care Screening Not Applicable    ?Medication Review Press photographer) Complete  Complete  ?PCP or Specialist appointment within 3-5 days of discharge   Complete  ?[place] or Home Care Consult   Complete  ?SW Recovery Care/Counseling Consult   Complete  ?Palliative Care Screening   Not Applicable  ?[place]   Not Applicable  ? ? ?

## 2021-12-08 LAB — CULTURE, BLOOD (ROUTINE X 2)
Culture: NO GROWTH
Special Requests: ADEQUATE

## 2021-12-09 DIAGNOSIS — N186 End stage renal disease: Secondary | ICD-10-CM | POA: Diagnosis not present

## 2021-12-09 DIAGNOSIS — N2581 Secondary hyperparathyroidism of renal origin: Secondary | ICD-10-CM | POA: Diagnosis not present

## 2021-12-09 DIAGNOSIS — Z992 Dependence on renal dialysis: Secondary | ICD-10-CM | POA: Diagnosis not present

## 2021-12-09 DIAGNOSIS — D509 Iron deficiency anemia, unspecified: Secondary | ICD-10-CM | POA: Diagnosis not present

## 2021-12-09 DIAGNOSIS — D631 Anemia in chronic kidney disease: Secondary | ICD-10-CM | POA: Diagnosis not present

## 2021-12-09 DIAGNOSIS — N250 Renal osteodystrophy: Secondary | ICD-10-CM | POA: Diagnosis not present

## 2021-12-10 DIAGNOSIS — I5022 Chronic systolic (congestive) heart failure: Secondary | ICD-10-CM | POA: Diagnosis not present

## 2021-12-10 DIAGNOSIS — N186 End stage renal disease: Secondary | ICD-10-CM | POA: Diagnosis not present

## 2021-12-10 DIAGNOSIS — Z992 Dependence on renal dialysis: Secondary | ICD-10-CM | POA: Diagnosis not present

## 2021-12-11 ENCOUNTER — Telehealth: Payer: Self-pay

## 2021-12-11 DIAGNOSIS — Z992 Dependence on renal dialysis: Secondary | ICD-10-CM | POA: Diagnosis not present

## 2021-12-11 DIAGNOSIS — Z20822 Contact with and (suspected) exposure to covid-19: Secondary | ICD-10-CM | POA: Diagnosis not present

## 2021-12-11 DIAGNOSIS — N186 End stage renal disease: Secondary | ICD-10-CM | POA: Diagnosis not present

## 2021-12-11 DIAGNOSIS — E8779 Other fluid overload: Secondary | ICD-10-CM | POA: Diagnosis not present

## 2021-12-11 NOTE — Telephone Encounter (Signed)
Spoke with patient's sister and wife [name] to reschedule palliative Care consult. Consult is scheduled for 12/22/21 @ 11 AM in person.  ?

## 2021-12-12 DIAGNOSIS — R0689 Other abnormalities of breathing: Secondary | ICD-10-CM | POA: Diagnosis not present

## 2021-12-12 DIAGNOSIS — R4020 Unspecified coma: Secondary | ICD-10-CM | POA: Diagnosis not present

## 2021-12-12 DIAGNOSIS — I499 Cardiac arrhythmia, unspecified: Secondary | ICD-10-CM | POA: Diagnosis not present

## 2021-12-12 DIAGNOSIS — R404 Transient alteration of awareness: Secondary | ICD-10-CM | POA: Diagnosis not present

## 2021-12-21 ENCOUNTER — Ambulatory Visit: Payer: Medicare Other | Admitting: Student

## 2021-12-21 NOTE — Progress Notes (Deleted)
Cardiology Office Note    Date:  12/21/2021   ID:  [name], DOB 01-Jun-1951, MRN [id number]  PCP:  Caryl Bis, MD  Cardiologist: Rozann Lesches, MD    No chief complaint on file.   History of Present Illness:    [name] is a 71 y.o. male with past medical history of CAD (s/p NST in 03/2020 showing evidence of large inferior scar with no current ischemia), HTN, HLD, Type II DM, carotid artery stenosis (status post left CEA in 10/2020), PAD (s/p R BKA), history of CVA and ESRD who presents to the office today for hospital follow-up.  He was admitted to [facility] in 11/2021 for evaluation of 2 syncopal episodes and echocardiogram during his evaluation showed that his EF was reduced at 30 to 35% and he was also noted to have severe low-flow AS.  He was transferred to [facility] for a cardiac catheterization which was performed on 11/21/2021 and showed severe two-vessel CAD with 90% stenosis along the LAD and the first diagonal was occluded with collaterals present.The RCA was also occluded with left to right collaterals present. He had moderately elevated LV filling pressures with PCWP mean at 20 mmHg with high RV filling pressures and moderate to severe pulmonary hypertension with mean PASP of 44 mmHg. Given his multiple comorbidities and multivessel CAD, he was not felt to be a candidate for TAVR and palliative care consult was recommended.  He was made DNR during admission. Medical therapy was limited secondary to hypotension and he was discharged on Atorvastatin 80 mg daily, Plavix 75 mg daily, Lopressor 12.5 mg twice daily and Imdur 15 mg daily.  He was again admitted to [facility] from 4/23 - 12/07/2021 for acute hypoxic respiratory failure in the setting of a CHF exacerbation and volume overload in the setting of ESRD. Nephrology did consult on the patient and recommended having dialysis 4 days/week to accommodate his poor cardiac function and severe AS.  Palliative Care  also followed the patient during admission with outpatient palliative services recommended.   Past Medical History:  Diagnosis Date   Anemia    Anxiety    Arthritis    CAD (coronary artery disease)    Myoview August 2021 with evidence of large inferior scar but no active ischemia   COPD (chronic obstructive pulmonary disease) (HCC)    Depression    ESRD on hemodialysis (HCC)    Essential hypertension    GERD (gastroesophageal reflux disease)    H/O hiatal hernia    Headache(784.0)    History of kidney stones    History of pneumonia    Hypothyroidism    Neuropathy    Non-healing wound of amputation stump (HCC)    Right   Peripheral vascular disease (HCC)    PTSD (post-traumatic stress disorder)    Type 2 diabetes mellitus ([place])     Past Surgical History:  Procedure Laterality Date   A/V FISTULAGRAM Left 01/16/2019   Procedure: A/V FISTULAGRAM;  Surgeon: [name], MD;  Location: [place] [facility];  Service: Cardiovascular;  Laterality: Left;   ABDOMINAL AORTOGRAM W/LOWER EXTREMITY Bilateral 03/18/2020   Procedure: ABDOMINAL AORTOGRAM W/LOWER EXTREMITY;  Surgeon: [name], MD;  Location: [place] [facility];  Service: Cardiovascular;  Laterality: Bilateral;   AMPUTATION Right 12/01/2018   Procedure: RIGHT AMPUTATION BELOW KNEE;  Surgeon: [name], MD;  Location: [facility] OR;  Service: Vascular;  Laterality: Right;   AMPUTATION Right 04/27/2019  Procedure: AMPUTATION BELOW KNEE REVISION;  Surgeon: [name], MD;  Location: [place];  Service: Vascular;  Laterality: Right;   AMPUTATION Right 04/30/2019   Procedure: AMPUTATION ABOVE KNEE - RIGHT;  Surgeon: Waynetta Sandy, MD;  Location: [facility];  Service: Vascular;  Laterality: Right;   APPLICATION OF WOUND VAC Right 04/27/2019   Procedure: APPLICATION OF WOUND VAC;  Surgeon: [name], MD;  Location: [place];  Service: Vascular;  Laterality: Right;   AV FISTULA PLACEMENT  2012      left arm     AV FISTULA PLACEMENT Left 12/18/2012   Procedure: ARTERIOVENOUS (AV) FISTULA CREATION;  Surgeon: Angelia Mould, MD;  Location: [place];  Service: Vascular;  Laterality: Left;   COLONOSCOPY  10/26/2011   Procedure: COLONOSCOPY;  Surgeon: Rogene Houston, MD;  [facility];  Service: Endoscopy;  Laterality: N/A;  730   EMBOLECTOMY Left 12/09/2012   Procedure: EMBOLECTOMY;  Surgeon: Serafina Mitchell, MD;  Location: [facility] CATH LAB;  Service: Cardiovascular;  Laterality: Left;  left arm venous embolization   ENDARTERECTOMY Left 10/24/2020   Procedure: LEFT CAROTID ENDARTERECTOMY;  Surgeon: [name], MD;  Location: [facility] OR;  Service: Vascular;  Laterality: Left;   ENDARTERECTOMY FEMORAL Left 04/04/2020   Procedure: ENDARTERECTOMY COMMON FEMORAL;  Surgeon: [name], MD;  Location: [facility] OR;  Service: Vascular;  Laterality: Left;   ESOPHAGOGASTRODUODENOSCOPY (EGD) WITH ESOPHAGEAL DILATION N/A 04/23/2013   Procedure: ESOPHAGOGASTRODUODENOSCOPY (EGD) WITH ESOPHAGEAL DILATION;  Surgeon: Rogene Houston, MD;  [facility];  Service: Endoscopy;  Laterality: N/A;  200-moved to 930    FEMORAL-POPLITEAL BYPASS GRAFT Left 04/04/2020   Procedure: FEMORAL- BELOW KNEE POPLITEAL BYPASS GRAFT NON REVERSED VEIN;  Surgeon: [name], MD;  Location: [place];  Service: Vascular;  Laterality: Left;   FISTULA SUPERFICIALIZATION Left 06/18/2013   Procedure: FISTULA SUPERFICIALIZATION & LIGATION BRANCH X 1;  Surgeon: Mal Misty, MD;  Location: [place];  Service: Vascular;  Laterality: Left;   GROIN DEBRIDEMENT Left 04/29/2020   Procedure: EXPLORATION LEFT GROIN WITH DEBRIDEMENT;  Surgeon: Rosetta Posner, MD;  Location: [place];  Service: Vascular;  Laterality: Left;   HEMATOMA EVACUATION Right 02/11/2017   Procedure: EVACUATION HEMATOMA RIGHT GROIN, Repair of Right Pseudo-anerysm.;  Surgeon: [name], MD;  Location: [place];  Service: Vascular;  Laterality: Right;   INGUINAL HERNIA REPAIR      ,  times   2   INSERTION OF DIALYSIS CATHETER Left 12/18/2012   Procedure: INSERTION OF DIALYSIS CATHETER;  Surgeon: Angelia Mould, MD;  Location: [place];  Service: Vascular;  Laterality: Left;   KNEE ARTHROSCOPY  2011   Right Knee   LOWER EXTREMITY ANGIOGRAPHY N/A 02/11/2017   Procedure: Lower Extremity Angiography;  Surgeon: Lorretta Harp, MD;  Location: [place] [facility];  Service: Cardiovascular;  Laterality: N/A;   PERIPHERAL ATHRECTOMY  02/11/2017   PERIPHERAL VASCULAR ATHERECTOMY Left 02/11/2017   Procedure: Peripheral Vascular Atherectomy;  Surgeon: Lorretta Harp, MD;  Location: [place] [facility];  Service: Cardiovascular;  Laterality: Left;   PERIPHERAL VASCULAR BALLOON ANGIOPLASTY Left 01/16/2019   Procedure: PERIPHERAL VASCULAR BALLOON ANGIOPLASTY;  Surgeon: [name], MD;  Location: [place] [facility];  Service: Cardiovascular;  Laterality: Left;  central vein   REVISON OF ARTERIOVENOUS FISTULA Left 1/61/0960   Procedure: PLICATION OF LEFT BRACHIOCEPHALIC ARTERIOVENOUS FISTULA;  Surgeon: Conrad Willow Grove, MD;  Location: [place];  Service:  Vascular;  Laterality: Left;   RIGHT HEART CATH AND CORONARY ANGIOGRAPHY N/A 11/21/2021   Procedure: RIGHT HEART CATH AND CORONARY ANGIOGRAPHY;  Surgeon: Martinique, Peter M, MD;  Location: [place] [facility];  Service: Cardiovascular;  Laterality: N/A;   SHUNTOGRAM N/A 12/09/2012   Procedure: fistulogram;  Surgeon: Serafina Mitchell, MD;  Location: [facility] CATH LAB;  Service: Cardiovascular;  Laterality: N/A;   SHUNTOGRAM Left 06/03/2013   Procedure: Fistulogram;  Surgeon: Serafina Mitchell, MD;  Location: [facility] CATH LAB;  Service: Cardiovascular;  Laterality: Left;   THROMBECTOMY W/ EMBOLECTOMY Left 12/11/2012   Procedure: THROMBECTOMY ARTERIOVENOUS FISTULA;  Surgeon: Serafina Mitchell, MD;  Location: [place];  Service: Vascular;  Laterality: Left;   TONSILLECTOMY     WOUND DEBRIDEMENT Right 02/11/2019   Procedure: DEBRIDEMENT WOUND RIGHT BELOW THE KNEE  STUMP;  Surgeon: Serafina Mitchell, MD;  Location: [facility];  Service: Vascular;  Laterality: Right;    Current Medications: Outpatient Medications Prior to Visit  Medication Sig Dispense Refill   albuterol (VENTOLIN HFA) 108 (90 Base) MCG/ACT inhaler Inhale 2 puffs into the lungs every 6 (six) hours as needed for wheezing or shortness of breath.      ALPRAZolam (XANAX) 0.5 MG tablet Take 0.5 mg by mouth 2 (two) times daily as needed for anxiety.     ARTIFICIAL TEAR OP Place 1 drop into both eyes every 6 (six) hours as needed (dry eyes).     atorvastatin (LIPITOR) 80 MG tablet Take 1 tablet (80 mg total) by mouth at bedtime. (Patient taking differently: Take 80 mg by mouth daily.) 30 tablet 5   calcitRIOL (ROCALTROL) 0.25 MCG capsule Take 1 capsule (0.25 mcg total) by mouth every Tuesday, Thursday, and Saturday at 6 PM. 24 capsule 1   calcium acetate (PHOSLO) 667 MG capsule Take 1-2 capsules (667-1,334 mg total) by mouth See admin instructions. Take 1334 mg with meals and 667 mg with each snack     clopidogrel (PLAVIX) 75 MG tablet Take 1 tablet (75 mg total) by mouth daily. 30 tablet 0   diphenhydrAMINE (BENADRYL) 25 MG tablet Take 25 mg by mouth daily as needed for itching.     GLOBAL EASE INJECT PEN NEEDLES 32G X 4 MM MISC TO BE USED DAILY WITH INSULIN     isosorbide mononitrate (IMDUR) 30 MG 24 hr tablet Take 0.5 tablets (15 mg total) by mouth daily. 15 tablet 1   levothyroxine (SYNTHROID) 150 MCG tablet Take 150 mcg by mouth daily.     metoprolol tartrate (LOPRESSOR) 25 MG tablet Take 0.5 tablets (12.5 mg total) by mouth 2 (two) times daily. 30 tablet 1   midodrine (PROAMATINE) 10 MG tablet Take 1 tablet (10 mg total) by mouth every Monday, Wednesday, and Friday. 90 tablet 1   multivitamin (RENA-VIT) TABS tablet Take 1 tablet by mouth daily.     nutrition supplement, JUVEN, (JUVEN) PACK Take 1 packet by mouth 2 (two) times daily between meals. 60 packet 1   Oxycodone HCl 10 MG TABS Take 10 mg  by mouth 3 (three) times daily.     pantoprazole (PROTONIX) 40 MG tablet Take 40 mg by mouth daily as needed (acid reflux).     pregabalin (LYRICA) 75 MG capsule Take 2 capsules (150 mg total) by mouth daily. (Patient taking differently: Take 150 mg by mouth 2 (two) times daily.)     senna (SENOKOT) 8.6 MG tablet Take 1 tablet by mouth 2 (two) times daily.  TOUJEO SOLOSTAR 300 UNIT/ML SOPN Inject 10-15 Units into the skin at bedtime as needed (if blood sugar is 130 or higher).     No facility-administered medications prior to visit.     Allergies:   Ace inhibitors, Clonidine, Morphine and related, and Oxytocin   Social History   Socioeconomic History   Marital status: Married    Spouse name: [name]   Number of children: Not on file   Years of education: Not on file   Highest education level: Not on file  Occupational History   Occupation: retired  Tobacco Use   Smoking status: Every Day    Packs/day: 0.50    Years: 18.00    Pack years: 9.00    Types: Cigarettes    Last attempt to quit: 06/27/2020    Years since quitting: 1.4   Smokeless tobacco: Never  Vaping Use   Vaping Use: Never used  Substance and Sexual Activity   Alcohol use: No    Alcohol/week: 0.0 standard drinks   Drug use: Yes    Types: Marijuana    Comment: smokes everyday for pain relief   Sexual activity: Not on file  Other Topics Concern   Not on file  Social History Narrative   Mr Antenucci is a 71 year old retired patient who lives with wife [name], his primary caregiver. He reports he is independent/assist with his care needs    He has transportation to medical appointments   Social Determinants of Health   Financial Resource Strain: Not on file  Food Insecurity: Not on file  Transportation Needs: Not on file  Physical Activity: Not on file  Stress: Not on file  Social Connections: Not on file     Family History:  The patient's ***family history includes Healthy in his daughter and daughter;  Heart disease in his father.   Review of Systems:    Please see the history of present illness.     All other systems reviewed and are otherwise negative except as noted above.   Physical Exam:    VS:  There were no vitals taken for this visit.   General: Well developed, well nourished,male appearing in no acute distress. Head: Normocephalic, atraumatic. Neck: No carotid bruits. JVD not elevated.  Lungs: Respirations regular and unlabored, without wheezes or rales.  Heart: ***Regular rate and rhythm. No S3 or S4.  No murmur, no rubs, or gallops appreciated. Abdomen: Appears non-distended. No obvious abdominal masses. Msk:  Strength and tone appear normal for age. No obvious joint deformities or effusions. Extremities: No clubbing or cyanosis. No edema.  Distal pedal pulses are 2+ bilaterally. Neuro: Alert and oriented X 3. Moves all extremities spontaneously. No focal deficits noted. Psych:  Responds to questions appropriately with a normal affect. Skin: No rashes or lesions noted  Wt Readings from Last 3 Encounters:  12/07/21 236 lb 15.9 oz (107.5 kg)  11/24/21 231 lb 0.7 oz (104.8 kg)  10/11/21 238 lb (108 kg)        Studies/Labs Reviewed:   EKG:  EKG is*** ordered today.  The ekg ordered today demonstrates ***  Recent Labs: 11/20/2021: Magnesium 1.8 12/03/2021: ALT 55; B Natriuretic Peptide >4,500.0 12/07/2021: BUN 41; Creatinine, Ser 5.87; Hemoglobin 8.5; Platelets 138; Potassium 4.5; Sodium 141   Lipid Panel    Component Value Date/Time   CHOL 60 04/05/2020 0428   TRIG 155 (H) 04/05/2020 0428   HDL 17 (L) 04/05/2020 0428   CHOLHDL 3.5 04/05/2020 0428   VLDL 31  04/05/2020 0428   LDLCALC 12 04/05/2020 0428    Additional studies/ records that were reviewed today include:   Echocardiogram: 11/2021 IMPRESSIONS     1. Left ventricular ejection fraction, by estimation, is 30 to 35%. The  left ventricle has moderately decreased function. The left ventricle   demonstrates global hypokinesis. The left ventricular internal cavity size  was mildly dilated. There is moderate   left ventricular hypertrophy. Left ventricular diastolic parameters are  indeterminate.   2. Right ventricular systolic function is moderately reduced. The right  ventricular size is mildly enlarged. There is mildly elevated pulmonary  artery systolic pressure.   3. Left atrial size was moderately dilated.   4. The mitral valve is degenerative. Mild mitral valve regurgitation. No  evidence of mitral stenosis. Moderate mitral annular calcification.   5. Severe low flow AS with moderately decreased EF. AS/decreased EF new  since 2021 . The aortic valve was not well visualized. There is severe  calcifcation of the aortic valve. There is severe thickening of the aortic  valve. Aortic valve regurgitation  is trivial. Severe aortic valve stenosis.   6. The inferior vena cava is dilated in size with <50% respiratory  variability, suggesting right atrial pressure of 15 mmHg.   R/LHC: 11/2021 Prox LAD lesion is 50% stenosed.   Mid LAD lesion is 90% stenosed.   1st Diag lesion is 100% stenosed.   Prox RCA lesion is 100% stenosed.   Hemodynamic findings consistent with moderate pulmonary hypertension.   Severe 2 vessel obstructive CAD. The LAD has a 90% stenosis at the first septal perforator. The first diagonal appears to be occluded with collaterals. The RCA is occluded proximally with left to right collaterals. All vessels are severely calcified. Moderately elevated LV filling pressures. PCWP mean 20 mm Hg High RV filling pressures with RA pressure of 22 mm Hg Moderate to severe pulmonary HTN with mean PAP 44 mm Hg Cardiac index of 2.5 is probably low for someone with AV fistula. Severe PAD with difficult arterial access   Plan: will need heart team approach. Patient has significant CAD, aortic stenosis, decompensated CHF. Multiple co morbidities and difficult access.    Assessment:    No diagnosis found.   Plan:   In order of problems listed above:  ***    Shared Decision Making/Informed Consent:   {Are you ordering a CV Procedure (e.g. stress test, cath, DCCV, TEE, etc)?   Press F2        :[id number]}    Medication Adjustments/Labs and Tests Ordered: Current medicines are reviewed at length with the patient today.  Concerns regarding medicines are outlined above.  Medication changes, Labs and Tests ordered today are listed in the Patient Instructions below. There are no Patient Instructions on file for this visit.   Signed, Erma Heritage, PA-C  12/21/2021 10:15 AM    [place] [address] [place], [place] [address] Phone: [phone] Fax: [phone]

## 2021-12-22 ENCOUNTER — Other Ambulatory Visit: Payer: Medicare Other | Admitting: Family Medicine

## 2021-12-22 ENCOUNTER — Telehealth: Payer: Self-pay | Admitting: Family Medicine

## 2021-12-22 NOTE — Telephone Encounter (Signed)
TCT home number prior to scheduled visit, rang multiple times with no answer.  TCT cell number states it is not in service.  Drove to patient's home, front door was open and tv was on.  There were 2 cars in the driveway but despite knocking and waiting multiple times there was no answer.  Clayton Hippo FNP-C ?

## 2021-12-24 DIAGNOSIS — I350 Nonrheumatic aortic (valve) stenosis: Secondary | ICD-10-CM | POA: Diagnosis not present

## 2021-12-24 DIAGNOSIS — I25119 Atherosclerotic heart disease of native coronary artery with unspecified angina pectoris: Secondary | ICD-10-CM | POA: Diagnosis not present

## 2021-12-24 DIAGNOSIS — I5022 Chronic systolic (congestive) heart failure: Secondary | ICD-10-CM | POA: Diagnosis not present

## 2021-12-27 ENCOUNTER — Encounter: Payer: Self-pay | Admitting: Family Medicine

## 2021-12-27 NOTE — Progress Notes (Signed)
Called prior to visit with no answer on home phone.  Cell phone was "no longer in service".  Arrived at home, door was open with TV on but no answer to knocking on door.  Damaris Hippo FNP-C ?

## 2021-12-28 DIAGNOSIS — S80822D Blister (nonthermal), left lower leg, subsequent encounter: Secondary | ICD-10-CM | POA: Diagnosis not present

## 2021-12-28 DIAGNOSIS — M1711 Unilateral primary osteoarthritis, right knee: Secondary | ICD-10-CM | POA: Diagnosis not present

## 2021-12-28 DIAGNOSIS — I120 Hypertensive chronic kidney disease with stage 5 chronic kidney disease or end stage renal disease: Secondary | ICD-10-CM | POA: Diagnosis not present

## 2021-12-28 DIAGNOSIS — E1122 Type 2 diabetes mellitus with diabetic chronic kidney disease: Secondary | ICD-10-CM | POA: Diagnosis not present

## 2021-12-28 DIAGNOSIS — E1151 Type 2 diabetes mellitus with diabetic peripheral angiopathy without gangrene: Secondary | ICD-10-CM | POA: Diagnosis not present

## 2021-12-28 DIAGNOSIS — E1142 Type 2 diabetes mellitus with diabetic polyneuropathy: Secondary | ICD-10-CM | POA: Diagnosis not present

## 2021-12-28 DIAGNOSIS — I70202 Unspecified atherosclerosis of native arteries of extremities, left leg: Secondary | ICD-10-CM | POA: Diagnosis not present

## 2021-12-28 DIAGNOSIS — N186 End stage renal disease: Secondary | ICD-10-CM | POA: Diagnosis not present

## 2022-01-10 ENCOUNTER — Encounter (HOSPITAL_COMMUNITY): Payer: Self-pay | Admitting: *Deleted

## 2022-01-11 DIAGNOSIS — 419620001 Death: Secondary | SNOMED CT | POA: Diagnosis not present

## 2022-01-11 DEATH — deceased

## 2022-01-16 NOTE — Progress Notes (Deleted)
Cardiology Office Note  Date: 01/16/2022   ID: [name], DOB [birth year], MRN [id number]  PCP:  Caryl Bis, MD  Cardiologist:  Rozann Lesches, MD Electrophysiologist:  None   No chief complaint on file.   History of Present Illness: Clayton BUCHANON is a 71 y.o. male last seen in December 2022.  Past Medical History:  Diagnosis Date   Anemia    Anxiety    Arthritis    CAD (coronary artery disease)    Myoview August 2021 with evidence of large inferior scar but no active ischemia   COPD (chronic obstructive pulmonary disease) (HCC)    Depression    ESRD on hemodialysis (HCC)    Essential hypertension    GERD (gastroesophageal reflux disease)    H/O hiatal hernia    Headache(784.0)    History of kidney stones    History of pneumonia    Hypothyroidism    Neuropathy    Non-healing wound of amputation stump (HCC)    Right   Peripheral vascular disease (HCC)    PTSD (post-traumatic stress disorder)    Type 2 diabetes mellitus ([place])     Past Surgical History:  Procedure Laterality Date   A/V FISTULAGRAM Left 01/16/2019   Procedure: A/V FISTULAGRAM;  Surgeon: [name], MD;  Location: [place] [facility];  Service: Cardiovascular;  Laterality: Left;   ABDOMINAL AORTOGRAM W/LOWER EXTREMITY Bilateral 03/18/2020   Procedure: ABDOMINAL AORTOGRAM W/LOWER EXTREMITY;  Surgeon: [name], MD;  Location: [place] [facility];  Service: Cardiovascular;  Laterality: Bilateral;   AMPUTATION Right 12/01/2018   Procedure: RIGHT AMPUTATION BELOW KNEE;  Surgeon: [name], MD;  Location: [place];  Service: Vascular;  Laterality: Right;   AMPUTATION Right 04/27/2019   Procedure: AMPUTATION BELOW KNEE REVISION;  Surgeon: [name], MD;  Location: [place];  Service: Vascular;  Laterality: Right;   AMPUTATION Right 04/30/2019   Procedure: AMPUTATION ABOVE KNEE - RIGHT;  Surgeon: Waynetta Sandy, MD;  Location: [place];  Service: Vascular;  Laterality:  Right;   APPLICATION OF WOUND VAC Right 04/27/2019   Procedure: APPLICATION OF WOUND VAC;  Surgeon: [name], MD;  Location: [place];  Service: Vascular;  Laterality: Right;   AV FISTULA PLACEMENT  2012      left arm    AV FISTULA PLACEMENT Left 12/18/2012   Procedure: ARTERIOVENOUS (AV) FISTULA CREATION;  Surgeon: Angelia Mould, MD;  Location: [place];  Service: Vascular;  Laterality: Left;   COLONOSCOPY  10/26/2011   Procedure: COLONOSCOPY;  Surgeon: Rogene Houston, MD;  [facility];  Service: Endoscopy;  Laterality: N/A;  730   EMBOLECTOMY Left 12/09/2012   Procedure: EMBOLECTOMY;  Surgeon: Serafina Mitchell, MD;  Location[facility];  Service: Cardiovascular;  Laterality: Left;  left arm venous embolization   ENDARTERECTOMY Left 10/24/2020   Procedure: LEFT CAROTID ENDARTERECTOMY;  Surgeon: [name], MD;  Location: [facility] OR;  Service: Vascular;  Laterality: Left;   ENDARTERECTOMY FEMORAL Left 04/04/2020   Procedure: ENDARTERECTOMY COMMON FEMORAL;  Surgeon: [name], MD;  Location: [facility] OR;  Service: Vascular;  Laterality: Left;   ESOPHAGOGASTRODUODENOSCOPY (EGD) WITH ESOPHAGEAL DILATION N/A 04/23/2013   Procedure: ESOPHAGOGASTRODUODENOSCOPY (EGD) WITH ESOPHAGEAL DILATION;  Surgeon: Rogene Houston, MD;  [facility];  Service: Endoscopy;  Laterality: N/A;  200-moved to 930    FEMORAL-POPLITEAL BYPASS GRAFT Left 04/04/2020   Procedure: FEMORAL- BELOW KNEE POPLITEAL BYPASS  GRAFT NON REVERSED VEIN;  Surgeon: [name], MD;  Location: [place];  Service: Vascular;  Laterality: Left;   FISTULA SUPERFICIALIZATION Left 06/18/2013   Procedure: FISTULA SUPERFICIALIZATION & LIGATION BRANCH X 1;  Surgeon: Mal Misty, MD;  Location: [place];  Service: Vascular;  Laterality: Left;   GROIN DEBRIDEMENT Left 04/29/2020   Procedure: EXPLORATION LEFT GROIN WITH DEBRIDEMENT;  Surgeon: Rosetta Posner, MD;  Location: [place];  Service: Vascular;  Laterality: Left;    HEMATOMA EVACUATION Right 02/11/2017   Procedure: EVACUATION HEMATOMA RIGHT GROIN, Repair of Right Pseudo-anerysm.;  Surgeon: [name], MD;  Location: [place];  Service: Vascular;  Laterality: Right;   INGUINAL HERNIA REPAIR     ,  times   2   INSERTION OF DIALYSIS CATHETER Left 12/18/2012   Procedure: INSERTION OF DIALYSIS CATHETER;  Surgeon: Angelia Mould, MD;  Location: [place];  Service: Vascular;  Laterality: Left;   KNEE ARTHROSCOPY  2011   Right Knee   LOWER EXTREMITY ANGIOGRAPHY N/A 02/11/2017   Procedure: Lower Extremity Angiography;  Surgeon: Lorretta Harp, MD;  Location: [place] [facility];  Service: Cardiovascular;  Laterality: N/A;   PERIPHERAL ATHRECTOMY  02/11/2017   PERIPHERAL VASCULAR ATHERECTOMY Left 02/11/2017   Procedure: Peripheral Vascular Atherectomy;  Surgeon: Lorretta Harp, MD;  Location: [place] [facility];  Service: Cardiovascular;  Laterality: Left;   PERIPHERAL VASCULAR BALLOON ANGIOPLASTY Left 01/16/2019   Procedure: PERIPHERAL VASCULAR BALLOON ANGIOPLASTY;  Surgeon: [name], MD;  Location: [place] [facility];  Service: Cardiovascular;  Laterality: Left;  central vein   REVISON OF ARTERIOVENOUS FISTULA Left 3/50/0938   Procedure: PLICATION OF LEFT BRACHIOCEPHALIC ARTERIOVENOUS FISTULA;  Surgeon: Conrad Kimball, MD;  Location: [place];  Service: Vascular;  Laterality: Left;   RIGHT HEART CATH AND CORONARY ANGIOGRAPHY N/A 11/21/2021   Procedure: RIGHT HEART CATH AND CORONARY ANGIOGRAPHY;  Surgeon: Martinique, Peter M, MD;  Location: [place] [facility];  Service: Cardiovascular;  Laterality: N/A;   SHUNTOGRAM N/A 12/09/2012   Procedure: fistulogram;  Surgeon: Serafina Mitchell, MD;  Location: [facility];  Service: Cardiovascular;  Laterality: N/A;   SHUNTOGRAM Left 06/03/2013   Procedure: Fistulogram;  Surgeon: Serafina Mitchell, MD;  Location: [facility] CATH LAB;  Service: Cardiovascular;  Laterality: Left;   THROMBECTOMY W/ EMBOLECTOMY Left 12/11/2012   Procedure:  THROMBECTOMY ARTERIOVENOUS FISTULA;  Surgeon: Serafina Mitchell, MD;  Location: [place];  Service: Vascular;  Laterality: Left;   TONSILLECTOMY     WOUND DEBRIDEMENT Right 02/11/2019   Procedure: DEBRIDEMENT WOUND RIGHT BELOW THE KNEE STUMP;  Surgeon: Serafina Mitchell, MD;  Location: [facility];  Service: Vascular;  Laterality: Right;    Current Outpatient Medications  Medication Sig Dispense Refill   albuterol (VENTOLIN HFA) 108 (90 Base) MCG/ACT inhaler Inhale 2 puffs into the lungs every 6 (six) hours as needed for wheezing or shortness of breath.      ALPRAZolam (XANAX) 0.5 MG tablet Take 0.5 mg by mouth 2 (two) times daily as needed for anxiety.     ARTIFICIAL TEAR OP Place 1 drop into both eyes every 6 (six) hours as needed (dry eyes).     atorvastatin (LIPITOR) 80 MG tablet Take 1 tablet (80 mg total) by mouth at bedtime. (Patient taking differently: Take 80 mg by mouth daily.) 30 tablet 5   calcitRIOL (ROCALTROL) 0.25 MCG capsule Take 1 capsule (0.25 mcg total) by mouth every Tuesday, Thursday, and Saturday at  6 PM. 24 capsule 1   calcium acetate (PHOSLO) 667 MG capsule Take 1-2 capsules (667-1,334 mg total) by mouth See admin instructions. Take 1334 mg with meals and 667 mg with each snack     clopidogrel (PLAVIX) 75 MG tablet Take 1 tablet (75 mg total) by mouth daily. 30 tablet 0   diphenhydrAMINE (BENADRYL) 25 MG tablet Take 25 mg by mouth daily as needed for itching.     GLOBAL EASE INJECT PEN NEEDLES 32G X 4 MM MISC TO BE USED DAILY WITH INSULIN     isosorbide mononitrate (IMDUR) 30 MG 24 hr tablet Take 0.5 tablets (15 mg total) by mouth daily. 15 tablet 1   levothyroxine (SYNTHROID) 150 MCG tablet Take 150 mcg by mouth daily.     metoprolol tartrate (LOPRESSOR) 25 MG tablet Take 0.5 tablets (12.5 mg total) by mouth 2 (two) times daily. 30 tablet 1   midodrine (PROAMATINE) 10 MG tablet Take 1 tablet (10 mg total) by mouth every Monday, Wednesday, and Friday. 90 tablet 1   multivitamin  (RENA-VIT) TABS tablet Take 1 tablet by mouth daily.     nutrition supplement, JUVEN, (JUVEN) PACK Take 1 packet by mouth 2 (two) times daily between meals. 60 packet 1   Oxycodone HCl 10 MG TABS Take 10 mg by mouth 3 (three) times daily.     pantoprazole (PROTONIX) 40 MG tablet Take 40 mg by mouth daily as needed (acid reflux).     pregabalin (LYRICA) 75 MG capsule Take 2 capsules (150 mg total) by mouth daily. (Patient taking differently: Take 150 mg by mouth 2 (two) times daily.)     senna (SENOKOT) 8.6 MG tablet Take 1 tablet by mouth 2 (two) times daily.     TOUJEO SOLOSTAR 300 UNIT/ML SOPN Inject 10-15 Units into the skin at bedtime as needed (if blood sugar is 130 or higher).     No current facility-administered medications for this visit.   Allergies:  Ace inhibitors, Clonidine, Morphine and related, and Oxytocin   Social History: The patient  reports that he has been smoking cigarettes. He has a 9.00 pack-year smoking history. He has never used smokeless tobacco. He reports current drug use. Drug: Marijuana. He reports that he does not drink alcohol.   Family History: The patient's family history includes Healthy in his daughter and daughter; Heart disease in his father.   ROS:  Please see the history of present illness. Otherwise, complete review of systems is positive for {NONE DEFAULTED:18576}.  All other systems are reviewed and negative.   Physical Exam: VS:  There were no vitals taken for this visit., BMI There is no height or weight on file to calculate BMI.  Wt Readings from Last 3 Encounters:  12/07/21 236 lb 15.9 oz (107.5 kg)  11/24/21 231 lb 0.7 oz (104.8 kg)  10/11/21 238 lb (108 kg)    General: Patient appears comfortable at rest. HEENT: Conjunctiva and lids normal, oropharynx clear with moist mucosa. Neck: Supple, no elevated JVP or carotid bruits, no thyromegaly. Lungs: Clear to auscultation, nonlabored breathing at rest. Cardiac: Regular rate and rhythm, no S3  or significant systolic murmur, no pericardial rub. Abdomen: Soft, nontender, no hepatomegaly, bowel sounds present, no guarding or rebound. Extremities: No pitting edema, distal pulses 2+. Skin: Warm and dry. Musculoskeletal: No kyphosis. Neuropsychiatric: Alert and oriented x3, affect grossly appropriate.  ECG:  An ECG dated 12/04/2021 was personally reviewed today and demonstrated:  Sinus rhythm with right bundle branch block.  Recent Labwork: 11/20/2021: Magnesium 1.8 12/03/2021: ALT 55; AST 56; B Natriuretic Peptide >4,500.0 12/07/2021: BUN 41; Creatinine, Ser 5.87; Hemoglobin 8.5; Platelets 138; Potassium 4.5; Sodium 141     Component Value Date/Time   CHOL 60 04/05/2020 0428   TRIG 155 (H) 04/05/2020 0428   HDL 17 (L) 04/05/2020 0428   CHOLHDL 3.5 04/05/2020 0428   VLDL 31 04/05/2020 0428   LDLCALC 12 04/05/2020 0428    Other Studies Reviewed Today:  Echocardiogram 11/20/2021:  1. Left ventricular ejection fraction, by estimation, is 30 to 35%. The  left ventricle has moderately decreased function. The left ventricle  demonstrates global hypokinesis. The left ventricular internal cavity size  was mildly dilated. There is moderate   left ventricular hypertrophy. Left ventricular diastolic parameters are  indeterminate.   2. Right ventricular systolic function is moderately reduced. The right  ventricular size is mildly enlarged. There is mildly elevated pulmonary  artery systolic pressure.   3. Left atrial size was moderately dilated.   4. The mitral valve is degenerative. Mild mitral valve regurgitation. No  evidence of mitral stenosis. Moderate mitral annular calcification.   5. Severe low flow AS with moderately decreased EF. AS/decreased EF new  since 2021 . The aortic valve was not well visualized. There is severe  calcifcation of the aortic valve. There is severe thickening of the aortic  valve. Aortic valve regurgitation  is trivial. Severe aortic valve stenosis.    6. The inferior vena cava is dilated in size with <50% respiratory  variability, suggesting right atrial pressure of 15 mmHg.   Carotid Dopplers 11/20/2021: IMPRESSION: 1. Right carotid artery system: Less than 50% stenosis secondary to mild atherosclerotic plaque formation in the proximal internal carotid artery.   2. Left carotid artery system: Patent without significant atherosclerotic plaque formation.   3.  Vertebral artery system: Patent with antegrade flow bilaterally.  Cardiac catheterization 11/21/2021:   Prox LAD lesion is 50% stenosed.   Mid LAD lesion is 90% stenosed.   1st Diag lesion is 100% stenosed.   Prox RCA lesion is 100% stenosed.   Hemodynamic findings consistent with moderate pulmonary hypertension.   Severe 2 vessel obstructive CAD. The LAD has a 90% stenosis at the first septal perforator. The first diagonal appears to be occluded with collaterals. The RCA is occluded proximally with left to right collaterals. All vessels are severely calcified. Moderately elevated LV filling pressures. PCWP mean 20 mm Hg High RV filling pressures with RA pressure of 22 mm Hg Moderate to severe pulmonary HTN with mean PAP 44 mm Hg Cardiac index of 2.5 is probably low for someone with AV fistula. Severe PAD with difficult arterial access   Plan: will need heart team approach. Patient has significant CAD, aortic stenosis, decompensated CHF. Multiple co morbidities and difficult access.   Assessment and Plan:    Medication Adjustments/Labs and Tests Ordered: Current medicines are reviewed at length with the patient today.  Concerns regarding medicines are outlined above.   Tests Ordered: No orders of the defined types were placed in this encounter.   Medication Changes: No orders of the defined types were placed in this encounter.   Disposition:  Follow up {follow up:15908}  Signed, Satira Sark, MD, [facility] 01/16/2022 3:57 PM    [facility] [place], [place], [place] [address] Phone: [phone]; Fax: [phone]

## 2022-01-17 ENCOUNTER — Ambulatory Visit: Payer: Medicare Other | Admitting: Cardiology

## 2022-01-17 DIAGNOSIS — I25119 Atherosclerotic heart disease of native coronary artery with unspecified angina pectoris: Secondary | ICD-10-CM

## 2022-02-01 ENCOUNTER — Ambulatory Visit (INDEPENDENT_AMBULATORY_CARE_PROVIDER_SITE_OTHER): Payer: Medicare Other | Admitting: Gastroenterology
